# Patient Record
Sex: Female | Born: 1965 | Race: White | Hispanic: No | Marital: Married | Smoking: Never smoker
Health system: Southern US, Community
[De-identification: ages and names within clinical notes are randomized; demographics above are authoritative.]

## PROBLEM LIST (undated history)

## (undated) DIAGNOSIS — Z86010 Personal history of colonic polyps: Secondary | ICD-10-CM

## (undated) DIAGNOSIS — D509 Iron deficiency anemia, unspecified: Secondary | ICD-10-CM

## (undated) DIAGNOSIS — I471 Supraventricular tachycardia, unspecified: Secondary | ICD-10-CM

## (undated) DIAGNOSIS — F329 Major depressive disorder, single episode, unspecified: Secondary | ICD-10-CM

## (undated) DIAGNOSIS — M1990 Unspecified osteoarthritis, unspecified site: Secondary | ICD-10-CM

## (undated) DIAGNOSIS — R55 Syncope and collapse: Secondary | ICD-10-CM

## (undated) DIAGNOSIS — M5136 Other intervertebral disc degeneration, lumbar region: Secondary | ICD-10-CM

## (undated) DIAGNOSIS — F419 Anxiety disorder, unspecified: Secondary | ICD-10-CM

## (undated) DIAGNOSIS — I447 Left bundle-branch block, unspecified: Secondary | ICD-10-CM

## (undated) DIAGNOSIS — K5792 Diverticulitis of intestine, part unspecified, without perforation or abscess without bleeding: Secondary | ICD-10-CM

## (undated) DIAGNOSIS — K219 Gastro-esophageal reflux disease without esophagitis: Secondary | ICD-10-CM

## (undated) DIAGNOSIS — E119 Type 2 diabetes mellitus without complications: Secondary | ICD-10-CM

## (undated) DIAGNOSIS — N939 Abnormal uterine and vaginal bleeding, unspecified: Secondary | ICD-10-CM

## (undated) DIAGNOSIS — Z923 Personal history of irradiation: Secondary | ICD-10-CM

## (undated) DIAGNOSIS — I428 Other cardiomyopathies: Secondary | ICD-10-CM

## (undated) DIAGNOSIS — R0602 Shortness of breath: Secondary | ICD-10-CM

## (undated) DIAGNOSIS — I2510 Atherosclerotic heart disease of native coronary artery without angina pectoris: Secondary | ICD-10-CM

## (undated) DIAGNOSIS — T8859XA Other complications of anesthesia, initial encounter: Secondary | ICD-10-CM

## (undated) DIAGNOSIS — E7800 Pure hypercholesterolemia, unspecified: Secondary | ICD-10-CM

## (undated) DIAGNOSIS — I509 Heart failure, unspecified: Secondary | ICD-10-CM

## (undated) DIAGNOSIS — C50911 Malignant neoplasm of unspecified site of right female breast: Secondary | ICD-10-CM

## (undated) DIAGNOSIS — C50919 Malignant neoplasm of unspecified site of unspecified female breast: Secondary | ICD-10-CM

## (undated) DIAGNOSIS — I10 Essential (primary) hypertension: Secondary | ICD-10-CM

## (undated) DIAGNOSIS — A64 Unspecified sexually transmitted disease: Secondary | ICD-10-CM

## (undated) DIAGNOSIS — Z5189 Encounter for other specified aftercare: Secondary | ICD-10-CM

## (undated) DIAGNOSIS — Z9221 Personal history of antineoplastic chemotherapy: Secondary | ICD-10-CM

## (undated) DIAGNOSIS — A630 Anogenital (venereal) warts: Secondary | ICD-10-CM

## (undated) DIAGNOSIS — Z95810 Presence of automatic (implantable) cardiac defibrillator: Secondary | ICD-10-CM

## (undated) DIAGNOSIS — M51369 Other intervertebral disc degeneration, lumbar region without mention of lumbar back pain or lower extremity pain: Secondary | ICD-10-CM

## (undated) DIAGNOSIS — Z950 Presence of cardiac pacemaker: Secondary | ICD-10-CM

## (undated) DIAGNOSIS — G959 Disease of spinal cord, unspecified: Secondary | ICD-10-CM

## (undated) HISTORY — DX: Personal history of irradiation: Z92.3

## (undated) HISTORY — DX: Morbid (severe) obesity due to excess calories: E66.01

## (undated) HISTORY — DX: Abnormal uterine and vaginal bleeding, unspecified: N93.9

## (undated) HISTORY — DX: Unspecified sexually transmitted disease: A64

## (undated) HISTORY — DX: Supraventricular tachycardia, unspecified: I47.10

## (undated) HISTORY — DX: Major depressive disorder, single episode, unspecified: F32.9

## (undated) HISTORY — DX: Personal history of antineoplastic chemotherapy: Z92.21

## (undated) HISTORY — DX: Anxiety disorder, unspecified: F41.9

## (undated) HISTORY — DX: Anogenital (venereal) warts: A63.0

## (undated) HISTORY — DX: Unspecified osteoarthritis, unspecified site: M19.90

## (undated) HISTORY — DX: Encounter for other specified aftercare: Z51.89

## (undated) HISTORY — PX: MM BREAST STEREO BX*L*R/S: HXRAD495

## (undated) HISTORY — PX: PORTACATH PLACEMENT: SHX2246

## (undated) HISTORY — DX: Personal history of colonic polyps: Z86.010

## (undated) HISTORY — PX: CHOLECYSTECTOMY: SHX55

## (undated) HISTORY — DX: Malignant neoplasm of unspecified site of unspecified female breast: C50.919

## (undated) HISTORY — DX: Atherosclerotic heart disease of native coronary artery without angina pectoris: I25.10

## (undated) HISTORY — DX: Pure hypercholesterolemia, unspecified: E78.00

## (undated) HISTORY — DX: Iron deficiency anemia, unspecified: D50.9

## (undated) HISTORY — DX: Other cardiomyopathies: I42.8

## (undated) HISTORY — DX: Malignant neoplasm of unspecified site of right female breast: C50.911

## (undated) HISTORY — DX: Type 2 diabetes mellitus without complications: E11.9

## (undated) HISTORY — DX: Syncope and collapse: R55

## (undated) HISTORY — DX: Gastro-esophageal reflux disease without esophagitis: K21.9

## (undated) HISTORY — PX: OTHER SURGICAL HISTORY: SHX169

## (undated) HISTORY — DX: Left bundle-branch block, unspecified: I44.7

## (undated) HISTORY — DX: Disease of spinal cord, unspecified: G95.9

---

## 1997-11-19 DIAGNOSIS — Z5189 Encounter for other specified aftercare: Secondary | ICD-10-CM

## 1997-11-19 HISTORY — DX: Encounter for other specified aftercare: Z51.89

## 2001-03-23 ENCOUNTER — Emergency Department (HOSPITAL_COMMUNITY): Admission: EM | Admit: 2001-03-23 | Discharge: 2001-03-23 | Payer: Self-pay | Admitting: *Deleted

## 2001-03-30 ENCOUNTER — Encounter: Payer: Self-pay | Admitting: Family Medicine

## 2001-03-30 ENCOUNTER — Ambulatory Visit (HOSPITAL_COMMUNITY): Admission: RE | Admit: 2001-03-30 | Discharge: 2001-03-30 | Payer: Self-pay | Admitting: Family Medicine

## 2001-04-08 ENCOUNTER — Encounter: Payer: Self-pay | Admitting: Neurosurgery

## 2001-04-09 ENCOUNTER — Encounter: Payer: Self-pay | Admitting: Neurosurgery

## 2001-04-09 ENCOUNTER — Inpatient Hospital Stay (HOSPITAL_COMMUNITY): Admission: RE | Admit: 2001-04-09 | Discharge: 2001-04-10 | Payer: Self-pay | Admitting: Neurosurgery

## 2001-06-30 ENCOUNTER — Inpatient Hospital Stay (HOSPITAL_COMMUNITY): Admission: EM | Admit: 2001-06-30 | Discharge: 2001-07-08 | Payer: Self-pay | Admitting: Emergency Medicine

## 2001-06-30 ENCOUNTER — Encounter: Payer: Self-pay | Admitting: Neurosurgery

## 2001-07-04 ENCOUNTER — Encounter: Payer: Self-pay | Admitting: Neurosurgery

## 2001-07-05 ENCOUNTER — Encounter: Payer: Self-pay | Admitting: Neurosurgery

## 2001-07-06 ENCOUNTER — Encounter: Payer: Self-pay | Admitting: Pulmonary Disease

## 2001-07-07 ENCOUNTER — Encounter: Payer: Self-pay | Admitting: Pulmonary Disease

## 2001-07-25 ENCOUNTER — Emergency Department (HOSPITAL_COMMUNITY): Admission: EM | Admit: 2001-07-25 | Discharge: 2001-07-25 | Payer: Self-pay | Admitting: Family Medicine

## 2001-07-25 ENCOUNTER — Encounter: Payer: Self-pay | Admitting: *Deleted

## 2002-08-12 ENCOUNTER — Ambulatory Visit (HOSPITAL_COMMUNITY): Admission: RE | Admit: 2002-08-12 | Discharge: 2002-08-12 | Payer: Self-pay | Admitting: *Deleted

## 2002-08-12 ENCOUNTER — Encounter: Payer: Self-pay | Admitting: *Deleted

## 2003-02-24 ENCOUNTER — Ambulatory Visit (HOSPITAL_COMMUNITY): Admission: RE | Admit: 2003-02-24 | Discharge: 2003-02-24 | Payer: Self-pay | Admitting: *Deleted

## 2003-02-24 ENCOUNTER — Encounter: Payer: Self-pay | Admitting: *Deleted

## 2003-04-28 ENCOUNTER — Ambulatory Visit (HOSPITAL_COMMUNITY): Admission: AD | Admit: 2003-04-28 | Discharge: 2003-04-28 | Payer: Self-pay | Admitting: Obstetrics and Gynecology

## 2003-05-03 ENCOUNTER — Ambulatory Visit (HOSPITAL_COMMUNITY): Admission: RE | Admit: 2003-05-03 | Discharge: 2003-05-03 | Payer: Self-pay | Admitting: *Deleted

## 2003-05-03 ENCOUNTER — Encounter: Payer: Self-pay | Admitting: *Deleted

## 2003-05-26 ENCOUNTER — Ambulatory Visit (HOSPITAL_COMMUNITY): Admission: RE | Admit: 2003-05-26 | Discharge: 2003-05-26 | Payer: Self-pay | Admitting: *Deleted

## 2003-05-26 ENCOUNTER — Encounter: Payer: Self-pay | Admitting: *Deleted

## 2003-06-04 ENCOUNTER — Ambulatory Visit (HOSPITAL_COMMUNITY): Admission: AD | Admit: 2003-06-04 | Discharge: 2003-06-04 | Payer: Self-pay | Admitting: *Deleted

## 2003-06-08 ENCOUNTER — Ambulatory Visit (HOSPITAL_COMMUNITY): Admission: AD | Admit: 2003-06-08 | Discharge: 2003-06-08 | Payer: Self-pay | Admitting: *Deleted

## 2003-06-11 ENCOUNTER — Ambulatory Visit (HOSPITAL_COMMUNITY): Admission: AD | Admit: 2003-06-11 | Discharge: 2003-06-11 | Payer: Self-pay | Admitting: *Deleted

## 2003-06-15 ENCOUNTER — Ambulatory Visit (HOSPITAL_COMMUNITY): Admission: AD | Admit: 2003-06-15 | Discharge: 2003-06-15 | Payer: Self-pay | Admitting: *Deleted

## 2003-06-18 ENCOUNTER — Ambulatory Visit (HOSPITAL_COMMUNITY): Admission: AD | Admit: 2003-06-18 | Discharge: 2003-06-18 | Payer: Self-pay | Admitting: *Deleted

## 2003-06-22 ENCOUNTER — Ambulatory Visit (HOSPITAL_COMMUNITY): Admission: AD | Admit: 2003-06-22 | Discharge: 2003-06-22 | Payer: Self-pay | Admitting: *Deleted

## 2003-06-25 ENCOUNTER — Ambulatory Visit (HOSPITAL_COMMUNITY): Admission: AD | Admit: 2003-06-25 | Discharge: 2003-06-25 | Payer: Self-pay | Admitting: *Deleted

## 2003-06-26 ENCOUNTER — Ambulatory Visit (HOSPITAL_COMMUNITY): Admission: AD | Admit: 2003-06-26 | Discharge: 2003-06-26 | Payer: Self-pay | Admitting: *Deleted

## 2003-06-28 ENCOUNTER — Ambulatory Visit (HOSPITAL_COMMUNITY): Admission: AD | Admit: 2003-06-28 | Discharge: 2003-06-28 | Payer: Self-pay | Admitting: *Deleted

## 2003-07-01 ENCOUNTER — Ambulatory Visit (HOSPITAL_COMMUNITY): Admission: AD | Admit: 2003-07-01 | Discharge: 2003-07-01 | Payer: Self-pay | Admitting: *Deleted

## 2003-07-06 ENCOUNTER — Inpatient Hospital Stay (HOSPITAL_COMMUNITY): Admission: RE | Admit: 2003-07-06 | Discharge: 2003-07-09 | Payer: Self-pay | Admitting: *Deleted

## 2003-07-21 ENCOUNTER — Ambulatory Visit (HOSPITAL_COMMUNITY): Admission: RE | Admit: 2003-07-21 | Discharge: 2003-07-21 | Payer: Self-pay | Admitting: General Surgery

## 2003-07-21 ENCOUNTER — Encounter: Payer: Self-pay | Admitting: General Surgery

## 2004-06-20 ENCOUNTER — Other Ambulatory Visit: Admission: RE | Admit: 2004-06-20 | Discharge: 2004-06-20 | Payer: Self-pay

## 2005-07-06 ENCOUNTER — Ambulatory Visit: Payer: Self-pay | Admitting: Cardiology

## 2005-07-20 ENCOUNTER — Ambulatory Visit: Payer: Self-pay | Admitting: Cardiology

## 2005-07-27 ENCOUNTER — Ambulatory Visit: Payer: Self-pay | Admitting: *Deleted

## 2005-07-27 ENCOUNTER — Ambulatory Visit (HOSPITAL_COMMUNITY): Admission: RE | Admit: 2005-07-27 | Discharge: 2005-07-27 | Payer: Self-pay | Admitting: Cardiology

## 2005-08-20 ENCOUNTER — Ambulatory Visit: Payer: Self-pay | Admitting: Cardiology

## 2005-09-26 ENCOUNTER — Emergency Department (HOSPITAL_COMMUNITY): Admission: EM | Admit: 2005-09-26 | Discharge: 2005-09-27 | Payer: Self-pay | Admitting: Emergency Medicine

## 2005-10-16 ENCOUNTER — Ambulatory Visit: Payer: Self-pay | Admitting: Cardiology

## 2006-02-07 ENCOUNTER — Other Ambulatory Visit: Admission: RE | Admit: 2006-02-07 | Discharge: 2006-02-07 | Payer: Self-pay | Admitting: *Deleted

## 2006-02-07 ENCOUNTER — Encounter (INDEPENDENT_AMBULATORY_CARE_PROVIDER_SITE_OTHER): Payer: Self-pay | Admitting: *Deleted

## 2006-02-08 ENCOUNTER — Emergency Department (HOSPITAL_COMMUNITY): Admission: EM | Admit: 2006-02-08 | Discharge: 2006-02-08 | Payer: Self-pay | Admitting: Emergency Medicine

## 2006-06-19 ENCOUNTER — Emergency Department (HOSPITAL_COMMUNITY): Admission: EM | Admit: 2006-06-19 | Discharge: 2006-06-19 | Payer: Self-pay | Admitting: Emergency Medicine

## 2007-10-20 DIAGNOSIS — Z86010 Personal history of colonic polyps: Secondary | ICD-10-CM

## 2007-10-20 DIAGNOSIS — Z860101 Personal history of adenomatous and serrated colon polyps: Secondary | ICD-10-CM

## 2007-10-20 HISTORY — DX: Personal history of colonic polyps: Z86.010

## 2007-10-20 HISTORY — DX: Personal history of adenomatous and serrated colon polyps: Z86.0101

## 2007-10-31 ENCOUNTER — Ambulatory Visit: Payer: Self-pay | Admitting: Urgent Care

## 2007-11-03 ENCOUNTER — Ambulatory Visit: Payer: Self-pay | Admitting: Gastroenterology

## 2007-11-03 ENCOUNTER — Encounter: Payer: Self-pay | Admitting: Gastroenterology

## 2007-11-03 ENCOUNTER — Ambulatory Visit (HOSPITAL_COMMUNITY): Admission: RE | Admit: 2007-11-03 | Discharge: 2007-11-03 | Payer: Self-pay | Admitting: Gastroenterology

## 2007-11-03 HISTORY — PX: COLONOSCOPY: SHX174

## 2008-01-14 ENCOUNTER — Ambulatory Visit (HOSPITAL_COMMUNITY): Admission: RE | Admit: 2008-01-14 | Discharge: 2008-01-14 | Payer: Self-pay | Admitting: Internal Medicine

## 2008-01-25 ENCOUNTER — Emergency Department (HOSPITAL_COMMUNITY): Admission: EM | Admit: 2008-01-25 | Discharge: 2008-01-25 | Payer: Self-pay | Admitting: Emergency Medicine

## 2008-05-05 ENCOUNTER — Ambulatory Visit (HOSPITAL_COMMUNITY): Admission: RE | Admit: 2008-05-05 | Discharge: 2008-05-05 | Payer: Self-pay | Admitting: Internal Medicine

## 2009-01-24 ENCOUNTER — Ambulatory Visit (HOSPITAL_COMMUNITY): Admission: RE | Admit: 2009-01-24 | Discharge: 2009-01-24 | Payer: Self-pay | Admitting: Internal Medicine

## 2009-05-15 ENCOUNTER — Emergency Department (HOSPITAL_COMMUNITY): Admission: EM | Admit: 2009-05-15 | Discharge: 2009-05-16 | Payer: Self-pay | Admitting: Emergency Medicine

## 2009-06-07 ENCOUNTER — Other Ambulatory Visit: Admission: RE | Admit: 2009-06-07 | Discharge: 2009-06-07 | Payer: Self-pay | Admitting: Internal Medicine

## 2009-12-14 ENCOUNTER — Emergency Department (HOSPITAL_COMMUNITY): Admission: EM | Admit: 2009-12-14 | Discharge: 2009-12-14 | Payer: Self-pay | Admitting: Neurology

## 2010-01-07 ENCOUNTER — Emergency Department (HOSPITAL_COMMUNITY): Admission: EM | Admit: 2010-01-07 | Discharge: 2010-01-08 | Payer: Self-pay | Admitting: Emergency Medicine

## 2010-09-28 ENCOUNTER — Emergency Department (HOSPITAL_COMMUNITY)
Admission: EM | Admit: 2010-09-28 | Discharge: 2010-09-28 | Payer: Self-pay | Source: Home / Self Care | Admitting: Emergency Medicine

## 2010-11-19 HISTORY — PX: BREAST LUMPECTOMY: SHX2

## 2010-12-10 ENCOUNTER — Encounter: Payer: Self-pay | Admitting: Internal Medicine

## 2011-02-04 LAB — CBC
Hemoglobin: 12.8 g/dL (ref 12.0–15.0)
MCHC: 32.8 g/dL (ref 30.0–36.0)
MCV: 81.4 fL (ref 78.0–100.0)
Platelets: 236 10*3/uL (ref 150–400)
RDW: 17.5 % — ABNORMAL HIGH (ref 11.5–15.5)
WBC: 9.2 10*3/uL (ref 4.0–10.5)

## 2011-02-04 LAB — URINALYSIS, ROUTINE W REFLEX MICROSCOPIC
Bilirubin Urine: NEGATIVE
Glucose, UA: NEGATIVE mg/dL
Hgb urine dipstick: NEGATIVE
Ketones, ur: NEGATIVE mg/dL
Protein, ur: NEGATIVE mg/dL
Specific Gravity, Urine: 1.015 (ref 1.005–1.030)
Urobilinogen, UA: 0.2 mg/dL (ref 0.0–1.0)
pH: 6 (ref 5.0–8.0)

## 2011-02-04 LAB — COMPREHENSIVE METABOLIC PANEL
ALT: 33 U/L (ref 0–35)
AST: 38 U/L — ABNORMAL HIGH (ref 0–37)
Albumin: 3.8 g/dL (ref 3.5–5.2)
Alkaline Phosphatase: 42 U/L (ref 39–117)
BUN: 9 mg/dL (ref 6–23)
CO2: 28 mEq/L (ref 19–32)
Calcium: 9.1 mg/dL (ref 8.4–10.5)
Chloride: 102 mEq/L (ref 96–112)
GFR calc Af Amer: >60 mL/min (ref >60)
GFR calc non Af Amer: >60 mL/min (ref >60)
Glucose, Bld: 145 mg/dL — ABNORMAL HIGH (ref 70–99)
Sodium: 137 mEq/L (ref 135–145)
Total Bilirubin: 0.3 mg/dL (ref 0.3–1.2)
Total Protein: 7.5 g/dL (ref 6.0–8.3)

## 2011-02-04 LAB — PREGNANCY, URINE: Preg Test, Ur: NEGATIVE

## 2011-02-04 LAB — DIFFERENTIAL
Basophils Absolute: 0 10*3/uL (ref 0.0–0.1)
Basophils Relative: 0 % (ref 0–1)
Eosinophils Absolute: 0 10*3/uL (ref 0.0–0.7)
Eosinophils Relative: 0 % (ref 0–5)
Lymphs Abs: 1.4 10*3/uL (ref 0.7–4.0)
Monocytes Absolute: 0.4 10*3/uL (ref 0.1–1.0)
Monocytes Relative: 4 % (ref 3–12)
Neutro Abs: 7.3 10*3/uL (ref 1.7–7.7)
Neutrophils Relative %: 80 % — ABNORMAL HIGH (ref 43–77)

## 2011-02-04 LAB — LIPASE, BLOOD: Lipase: 32 U/L (ref 11–59)

## 2011-02-19 ENCOUNTER — Other Ambulatory Visit (HOSPITAL_COMMUNITY): Payer: Self-pay | Admitting: *Deleted

## 2011-02-19 ENCOUNTER — Other Ambulatory Visit: Payer: Self-pay | Admitting: Gastroenterology

## 2011-02-19 DIAGNOSIS — N6310 Unspecified lump in the right breast, unspecified quadrant: Secondary | ICD-10-CM

## 2011-02-26 LAB — URINALYSIS, ROUTINE W REFLEX MICROSCOPIC
Bilirubin Urine: NEGATIVE
Glucose, UA: NEGATIVE mg/dL
Hgb urine dipstick: NEGATIVE
Nitrite: NEGATIVE
Protein, ur: NEGATIVE mg/dL
Urobilinogen, UA: 0.2 mg/dL (ref 0.0–1.0)

## 2011-02-26 LAB — CBC
Hemoglobin: 13.3 g/dL (ref 12.0–15.0)
MCHC: 33.7 g/dL (ref 30.0–36.0)
Platelets: 312 10*3/uL (ref 150–400)
RBC: 4.81 MIL/uL (ref 3.87–5.11)
RDW: 15.6 % — ABNORMAL HIGH (ref 11.5–15.5)
WBC: 15.2 10*3/uL — ABNORMAL HIGH (ref 4.0–10.5)

## 2011-02-26 LAB — BASIC METABOLIC PANEL
CO2: 27 mEq/L (ref 19–32)
Chloride: 101 mEq/L (ref 96–112)
Creatinine, Ser: 1 mg/dL (ref 0.4–1.2)
GFR calc Af Amer: >60 mL/min (ref >60)
GFR calc non Af Amer: >60 mL/min (ref >60)
Glucose, Bld: 164 mg/dL — ABNORMAL HIGH (ref 70–99)
Sodium: 138 mEq/L (ref 135–145)

## 2011-02-26 LAB — DIFFERENTIAL
Basophils Absolute: 0.1 10*3/uL (ref 0.0–0.1)
Basophils Relative: 0 % (ref 0–1)
Eosinophils Absolute: 0.2 10*3/uL (ref 0.0–0.7)
Eosinophils Relative: 1 % (ref 0–5)
Lymphocytes Relative: 14 % (ref 12–46)
Lymphs Abs: 2.2 10*3/uL (ref 0.7–4.0)
Monocytes Absolute: 0.6 10*3/uL (ref 0.1–1.0)
Monocytes Relative: 4 % (ref 3–12)
Neutro Abs: 12.2 10*3/uL — ABNORMAL HIGH (ref 1.7–7.7)
Neutrophils Relative %: 80 % — ABNORMAL HIGH (ref 43–77)

## 2011-02-26 LAB — GLUCOSE, CAPILLARY: Glucose-Capillary: 193 mg/dL — ABNORMAL HIGH (ref 70–99)

## 2011-02-26 LAB — PREGNANCY, URINE: Preg Test, Ur: NEGATIVE

## 2011-02-28 ENCOUNTER — Other Ambulatory Visit (HOSPITAL_COMMUNITY): Payer: Self-pay | Admitting: *Deleted

## 2011-02-28 ENCOUNTER — Ambulatory Visit (HOSPITAL_COMMUNITY)
Admission: RE | Admit: 2011-02-28 | Discharge: 2011-02-28 | Disposition: A | Discharge status: Home / Self Care | Payer: Medicaid Other | Source: Ambulatory Visit | Attending: *Deleted | Admitting: *Deleted

## 2011-02-28 ENCOUNTER — Other Ambulatory Visit: Payer: Self-pay | Admitting: Gastroenterology

## 2011-02-28 ENCOUNTER — Ambulatory Visit (HOSPITAL_COMMUNITY)
Admission: RE | Admit: 2011-02-28 | Discharge: 2011-02-28 | Disposition: A | Discharge status: Home / Self Care | Payer: Medicaid Other | Source: Ambulatory Visit | Attending: Gastroenterology | Admitting: Gastroenterology

## 2011-02-28 ENCOUNTER — Other Ambulatory Visit: Payer: Self-pay | Admitting: Diagnostic Radiology

## 2011-02-28 DIAGNOSIS — C50919 Malignant neoplasm of unspecified site of unspecified female breast: Secondary | ICD-10-CM | POA: Insufficient documentation

## 2011-02-28 DIAGNOSIS — N6310 Unspecified lump in the right breast, unspecified quadrant: Secondary | ICD-10-CM

## 2011-02-28 DIAGNOSIS — IMO0002 Reserved for concepts with insufficient information to code with codable children: Secondary | ICD-10-CM

## 2011-03-05 ENCOUNTER — Other Ambulatory Visit: Payer: Self-pay | Admitting: Gastroenterology

## 2011-03-05 DIAGNOSIS — C50911 Malignant neoplasm of unspecified site of right female breast: Secondary | ICD-10-CM

## 2011-03-06 ENCOUNTER — Ambulatory Visit
Admission: RE | Admit: 2011-03-06 | Discharge: 2011-03-06 | Disposition: A | Discharge status: Home / Self Care | Payer: Self-pay | Source: Ambulatory Visit | Attending: Gastroenterology | Admitting: Gastroenterology

## 2011-03-06 DIAGNOSIS — C50911 Malignant neoplasm of unspecified site of right female breast: Secondary | ICD-10-CM

## 2011-03-06 MED ORDER — GADOBENATE DIMEGLUMINE 529 MG/ML IV SOLN
20.0000 mL | Freq: Once | INTRAVENOUS | Status: AC | PRN
  Administered 2011-03-06: 20 mL via INTRAVENOUS

## 2011-03-13 ENCOUNTER — Other Ambulatory Visit (HOSPITAL_COMMUNITY): Payer: Self-pay | Admitting: General Surgery

## 2011-03-13 DIAGNOSIS — C50919 Malignant neoplasm of unspecified site of unspecified female breast: Secondary | ICD-10-CM

## 2011-03-19 ENCOUNTER — Other Ambulatory Visit: Payer: Self-pay | Admitting: General Surgery

## 2011-03-19 ENCOUNTER — Encounter (HOSPITAL_COMMUNITY): Payer: Medicaid Other

## 2011-03-19 LAB — COMPREHENSIVE METABOLIC PANEL
Albumin: 3.8 g/dL (ref 3.5–5.2)
Alkaline Phosphatase: 50 U/L (ref 39–117)
BUN: 9 mg/dL (ref 6–23)
Calcium: 9.1 mg/dL (ref 8.4–10.5)
Creatinine, Ser: 0.77 mg/dL (ref 0.4–1.2)
GFR calc non Af Amer: >60 mL/min (ref >60)
Glucose, Bld: 78 mg/dL (ref 70–99)
Potassium: 3.6 mEq/L (ref 3.5–5.1)
Sodium: 137 mEq/L (ref 135–145)
Total Bilirubin: 0.4 mg/dL (ref 0.3–1.2)
Total Protein: 7.9 g/dL (ref 6.0–8.3)

## 2011-03-19 LAB — CBC
HCT: 39.4 % (ref 36.0–46.0)
Hemoglobin: 12.3 g/dL (ref 12.0–15.0)
MCH: 26.3 pg (ref 26.0–34.0)
MCV: 84.2 fL (ref 78.0–100.0)
RBC: 4.68 MIL/uL (ref 3.87–5.11)
RDW: 15.3 % (ref 11.5–15.5)

## 2011-03-19 LAB — SURGICAL PCR SCREEN
MRSA, PCR: NEGATIVE
Staphylococcus aureus: NEGATIVE

## 2011-03-21 ENCOUNTER — Ambulatory Visit (HOSPITAL_COMMUNITY)
Admission: RE | Admit: 2011-03-21 | Discharge: 2011-03-21 | Disposition: A | Discharge status: Home / Self Care | Payer: Medicaid Other | Source: Ambulatory Visit | Attending: General Surgery | Admitting: General Surgery

## 2011-03-21 ENCOUNTER — Other Ambulatory Visit: Payer: Self-pay | Admitting: General Surgery

## 2011-03-21 ENCOUNTER — Encounter (HOSPITAL_COMMUNITY): Payer: Self-pay

## 2011-03-21 DIAGNOSIS — Z01812 Encounter for preprocedural laboratory examination: Secondary | ICD-10-CM | POA: Insufficient documentation

## 2011-03-21 DIAGNOSIS — I10 Essential (primary) hypertension: Secondary | ICD-10-CM | POA: Insufficient documentation

## 2011-03-21 DIAGNOSIS — E119 Type 2 diabetes mellitus without complications: Secondary | ICD-10-CM | POA: Insufficient documentation

## 2011-03-21 DIAGNOSIS — C50919 Malignant neoplasm of unspecified site of unspecified female breast: Secondary | ICD-10-CM

## 2011-03-21 DIAGNOSIS — Z01818 Encounter for other preprocedural examination: Secondary | ICD-10-CM | POA: Insufficient documentation

## 2011-03-21 DIAGNOSIS — Z79899 Other long term (current) drug therapy: Secondary | ICD-10-CM | POA: Insufficient documentation

## 2011-03-21 LAB — GLUCOSE, CAPILLARY: Glucose-Capillary: 122 mg/dL — ABNORMAL HIGH (ref 70–99)

## 2011-03-21 MED ORDER — TECHNETIUM TC 99M SULFUR COLLOID FILTERED
1.0000 | Freq: Once | INTRAVENOUS | Status: AC | PRN
  Administered 2011-03-21: 1 via INTRADERMAL

## 2011-03-21 NOTE — H&P (Addendum)
  [name], [name]         ACCOUNT NO.:  [id number]  MEDICAL RECORD NO.:  [id number]          PATIENT TYPE:  OPT  LOCATION:  DAY                           FACILITY:  [facility]  PHYSICIAN:  [name], M.D.  DATE OF BIRTH:  03-May-1966  DATE OF ADMISSION: DATE OF DISCHARGE:  LH                             HISTORY & PHYSICAL   CHIEF COMPLAINT:  Right breast carcinoma.  HISTORY OF PRESENT ILLNESS:  The patient is a 45 year old white female who is referred for evaluation and treatment of a right breast cancer. This was found on routine examination.  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  The patient denies any nipple discharge. There is no immediate family history of breast cancer.  PAST MEDICAL HISTORY:  Unremarkable.  PAST SURGICAL HISTORY:  Unremarkable.  CURRENT MEDICATIONS:  None.  ALLERGIES:  No known drug allergies.  REVIEW OF SYSTEMS:  The patient denies drinking or smoking.  She denies any other cardiopulmonary difficulties or bleeding disorders.  PAST FAMILY MEDICAL HISTORY:  There is no family history of breast cancer.  PHYSICAL EXAMINATION:  GENERAL:  The patient is a obese white female, in no acute distress. HEENT:  Unremarkable. NECK:  Supple without lymphadenopathy. LUNGS:  Clear to auscultation with equal breath sounds bilaterally. HEART:  Regular rate and rhythm without S3, S4 or murmurs. ABDOMEN:  Unremarkable. BREASTS:  Right breast examination reveals a 3-5 cm dominant mass noted centrally behind the right nipple.  No nipple discharge or dimpling is noted.  No axillary lymphadenopathy.  Left breast examination reveals no dominant mass, nipple discharge, or dimpling.  The axilla is negative for palpable nodes.  Mammogram and MRI results were reviewed.  IMPRESSION:  Right breast carcinoma.  PLAN:  The patient is scheduled to undergo a right partial mastectomy with sentinel lymph node biopsy and possible axillary dissection  on Mar 21, 2011.  Risks and benefits of the procedure including bleeding, infection, recurrence and the possibility of needing a completion mastectomy due to incomplete margins were fully explained to the patient, gave informed consent.     [name], M.D.     MAJ/MEDQ  D:  03/13/2011  T:  03/14/2011  Job:  [id number]  cc:   [facility] Department  Electronically Signed by [name] M.D. on 04/16/2011 08:37:43 PM

## 2011-03-24 ENCOUNTER — Emergency Department (HOSPITAL_COMMUNITY)
Admission: EM | Admit: 2011-03-24 | Discharge: 2011-03-24 | Disposition: A | Discharge status: Home / Self Care | Payer: Medicaid Other | Attending: Emergency Medicine | Admitting: Emergency Medicine

## 2011-03-24 DIAGNOSIS — IMO0002 Reserved for concepts with insufficient information to code with codable children: Secondary | ICD-10-CM | POA: Insufficient documentation

## 2011-03-24 DIAGNOSIS — Z853 Personal history of malignant neoplasm of breast: Secondary | ICD-10-CM | POA: Insufficient documentation

## 2011-03-24 DIAGNOSIS — E7800 Pure hypercholesterolemia, unspecified: Secondary | ICD-10-CM | POA: Insufficient documentation

## 2011-03-24 DIAGNOSIS — Z79899 Other long term (current) drug therapy: Secondary | ICD-10-CM | POA: Insufficient documentation

## 2011-03-24 DIAGNOSIS — E119 Type 2 diabetes mellitus without complications: Secondary | ICD-10-CM | POA: Insufficient documentation

## 2011-03-24 DIAGNOSIS — F329 Major depressive disorder, single episode, unspecified: Secondary | ICD-10-CM | POA: Insufficient documentation

## 2011-03-24 DIAGNOSIS — Y836 Removal of other organ (partial) (total) as the cause of abnormal reaction of the patient, or of later complication, without mention of misadventure at the time of the procedure: Secondary | ICD-10-CM | POA: Insufficient documentation

## 2011-03-24 DIAGNOSIS — I10 Essential (primary) hypertension: Secondary | ICD-10-CM | POA: Insufficient documentation

## 2011-03-24 DIAGNOSIS — F3289 Other specified depressive episodes: Secondary | ICD-10-CM | POA: Insufficient documentation

## 2011-03-24 LAB — GLUCOSE, CAPILLARY: Glucose-Capillary: 112 mg/dL — ABNORMAL HIGH (ref 70–99)

## 2011-03-24 NOTE — Op Note (Signed)
[name], [name]         ACCOUNT NO.:  [id number]  MEDICAL RECORD NO.:  [id number]           PATIENT TYPE:  O  LOCATION:  DAYP                          FACILITY:  [facility]  PHYSICIAN:  [name], M.D.  DATE OF BIRTH:  16-Aug-1966  DATE OF PROCEDURE:  03/21/2011 DATE OF DISCHARGE:                              OPERATIVE REPORT   PREOPERATIVE DIAGNOSIS:  Right breast carcinoma, stage II clinically.  POSTOPERATIVE DIAGNOSIS:  Right breast carcinoma, stage II clinically.  PROCEDURE:  Right partial mastectomy, sentinel lymph node biopsy.  SURGEON:  [name], MD  ANESTHESIA:  General endotracheal.  INDICATIONS:  The patient is a 45 year old white female who presents with biopsy-proven right breast carcinoma.  The patient now comes to the operating room for right partial mastectomy with sentinel lymph node biopsy.  The risks and benefits of the procedure including bleeding, infection, and the possibility of unclear margins were fully explained to the patient, gave informed consent.  PROCEDURE NOTE:  The patient was placed in the supine position.  After induction of general endotracheal anesthesia, the area of the tumorous lesion in the right breast was injected with methylene blue.  This was massaged into the breast tissue for 5 minutes.  The right breast and axilla were then prepped and draped using the usual sterile technique with ChloraPrep.  Surgical site confirmation was performed.  Using the navigator, an incision was made into the right axilla.  At the skin level, the count was 300.  The dissection was taken down to the first sentinel lymph node.  The in vivo count was 3000, ex vivo 4000, the node was noted to be blue.  It was sent to pathology for further examination.  The basin count was less than 100.  The frozen section of the sentinel lymph node was negative for malignancy.  The wound was irrigated with normal saline.  Any bleeding was controlled using  clips and Bovie electrocautery.  The skin was closed using a 4-0 Vicryl subcuticular suture.  Sensorcaine 0.5% was instilled into the surrounding wound.  Dermabond was then applied.  Next, a curvilinear incision was made over the areola.  This dissection was taken down to the primary mass which was very closely adherent to the chest wall.  The mass was removed along with some fascia from the chest wall.  A short suture was placed superiorly and long suture placed laterally for orientation purposes.  It was sent to Pathology for further examination.  Any bleeding was controlled using Bovie electrocautery.  The subcutaneous layer was reapproximated using 2-0 Vicryl interrupted sutures.  The skin was closed using a 4-0 Vicryl subcuticular suture.  Sensorcaine 0.5% was instilled into the surrounding wound.  Dermabond was then applied.  All tape and needle counts were correct at the end of the procedure. The patient was extubated in the operating room and went back to recovery room, awake in stable condition.  COMPLICATIONS:  None.  SPECIMEN: 1. Sentinel lymph node, right axilla. 2. Right breast tissue.  BLOOD LOSS:  50 mL.     [name], M.D.     MAJ/MEDQ  D:  03/21/2011  T:  03/22/2011  Job:  [id number]  cc:   [facility] Department.  Electronically Signed by [name] M.D. on 03/24/2011 08:07:11 PM

## 2011-04-03 NOTE — Op Note (Signed)
NAME:  [name], [name]         ACCOUNT NO.:  [id number]   MEDICAL RECORD NO.:  [id number]          PATIENT TYPE:  AMB   LOCATION:  DAY                           FACILITY:  [facility]   PHYSICIAN:  Kassie Mends, M.D.      DATE OF BIRTH:  July 27, 1966   DATE OF PROCEDURE:  11/03/2007  DATE OF DISCHARGE:                               OPERATIVE REPORT   REFERRING :  [facility] Department   PROCEDURE:  Colonoscopy with cold forceps polypectomy and cold forceps  biopsy.   INDICATION FOR EXAM:  [name] is a 45 year old female who recently  developed diarrhea over the last year.  She has also had some occasional  abdominal cramping.  The abdominal cramping can sometimes be relieved  with bowel movements.  She saw rectal bleeding once.  She takes Prevacid  for her gastroesophageal reflux disease.  She was changed to Nexium as  an outpatient on October 31, 2007.  She had a cholecystectomy years  ago.   FINDINGS:  1. A 4-mm sessile sigmoid colon polyp removed via cold forceps.      Otherwise no masses, inflammatory changes, diverticula or AVMs      seen.  Random colon biopsies obtained to evaluate for microscopic      colitis.  2. Small internal hemorrhoids, otherwise normal retroflexed view of      the rectum.   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  Biopsies obtained to evaluate for microscopic colitis.  The differential  diagnosis also includes bile salt-induced diarrhea.   RECOMMENDATIONS:  1. Return visit with Lorenza Burton, N.P., in 6 weeks to reassess her      diarrhea.  2. Will call [name] with the results of her biopsies.  If her      polyp is adenomatous, then she should have a screening colonoscopy      in 5 years and her first-degree relatives should have a screening      colonoscopy at age 40 and then every 5 years.  3. Continue daily fiber supplementation.  4. Add Bentyl 10 mg twice daily.  5. No aspirin, NSAIDs  or anticoagulation for 7 days.  6. She should follow a high-fiber diet.  She was given a handout on      high-fiber diet, polyps and hemorrhoids.   PROCEDURE TECHNIQUE:  Physical exam was performed and informed consent  was obtained from the patient after explaining the benefits, risks and  alternatives to procedure.  The patient was connected to the monitor and  placed in the left lateral position.  Continuous oxygen was provided by  nasal cannula and IV medicine administered through an indwelling  cannula.  After administration of sedation and rectal exam, the  patient's rectum was intubated and the scope was advanced under direct  visualization to the cecum.  The scope was removed slowly by carefully  examining the color, texture, anatomy and integrity of the mucosa on the  way out.  The patient was recovered in endoscopy and discharged to the  floor in satisfactory condition.   ADDENDUM:  Biopsies negative for microscopic  colitis. Diarhhea likely  secodnary to IBS or bile-salt induced diarrhea.      Kassie Mends, M.D.  Electronically Signed     SM/MEDQ  D:  11/03/2007  T:  11/04/2007  Job:  [id number]

## 2011-04-03 NOTE — Consult Note (Signed)
NAME:  [name], [name]         ACCOUNT NO.:  [id number]   MEDICAL RECORD NO.:  [id number]          PATIENT TYPE:  AMB   LOCATION:  DAY                           FACILITY:  [facility]   PHYSICIAN:  Kassie Mends, M.D.      DATE OF BIRTH:  08-31-1966   DATE OF CONSULTATION:  10/31/2007  DATE OF DISCHARGE:                                 CONSULTATION   REQUESTING PHYSICIAN:  [facility].   CHIEF COMPLAINT:  Intermittent chronic diarrhea with intermittent  hematochezia/family history of colon cancer.   HISTORY OF PRESENT ILLNESS:  [name] is a  is a 45 year old  female.  She has had intermittent chronic diarrhea for quite some time.  She will have three to four loose bowel movements a day for several days  of the week.  She has also noticed intermittent small to moderate volume  bright red blood with wiping post defecation.  She denies any mucus in  her stools.  She does have some transient nausea.  Denies any vomiting.  She rarely has constipation.  She has taken Imodium for the diarrhea  which did seem to help some.  She has history of chronic GERD.  She is  on Prevacid 30 mg daily now.  She previously was on Nexium 40 mg daily  which seemed to help more.  She occasionally is having breakthrough  symptoms 3-4 days a week since she made the change.  The reason she  changed was due to the fact that she was unable to obtain further  samples and could not afford the medication herself.  She has noticed a  mid abdominal pain just above the umbilicus which is usually resolved  post defecation.  She denies any significant dysphagia or odynophagia.  She has had some globus sensation in the back of her throat.   PAST MEDICAL HISTORY:  1. Heart murmur.  2. Hypertension.  3. Anxiety.  4. Depression.  5. GERD.  6. Two back surgeries.  7. D&C.   CURRENT MEDICATIONS:  1. Labetalol 200 mg b.i.d.  2. Paxil 37.5 mg daily.  3. Cardizem unknown dose daily.  4. Prevacid 30 mg daily.   ALLERGIES:  No known drug allergies.   FAMILY HISTORY:  Positive for mother diagnosed with colon cancer in her  mid 62s.  She died from metastatic disease.  Father deceased at age 33  with coronary disease, hypertension, diabetes mellitus.  One sister has  bipolar disorder.  One brother with hypertension.   SOCIAL HISTORY:  [name] is married.  She has three healthy  children.  She is unemployed.  She denies any tobacco, alcohol or drug  use.   REVIEW OF SYSTEMS:  See HPI, otherwise negative.   PHYSICAL EXAMINATION:  VITAL SIGNS:  Weight is 266.5 pounds, height 64  inches, temperature 98.6, blood pressure 122/92 and pulse 88.  GENERAL:  [name] is an obese, Caucasian female who is alert,  oriented and cooperative in no acute distress.  HEENT:  Sclerae clear, nonicteric.  Conjunctivae pink.  Oropharynx pink  and moist without lesions.  NECK:  Supple with thyromegaly.  CHEST:  Heart regular rate and rhythm with a 1/6 murmur noted.  LUNGS:  Clear to auscultation bilaterally.  ABDOMEN:  Protuberant with positive bowel sounds x4.  No bruits  auscultated.  Soft, nontender, nondistended without palpable mass or  hepatosplenomegaly.  No rebound tenderness or guarding.  EXTREMITIES:  Without clubbing or edema bilaterally.  RECTAL:  Deferred.   IMPRESSION:  [name] is a 45 year old female with a history of  chronic intermittent diarrhea occurring 3-4 days per week along with  intermittent hematochezia.  I suspect she may have IBS with benign  anorectal bleeding from either hemorrhoids or fissure.  However,  she  does have a family history which is significant for colon cancer and is  going to require further evaluation to rule out colorectal neoplasia as  well as inflammatory bowel disease.   She has GERD which was well-controlled on Nexium.  Due to the cost of  the medication, she has been switched to Prevacid and is having some  breakthrough symptoms.    RECOMMENDATIONS:  1. Will request recent labs from the free clinic.  2. Will resume Nexium 40 mg daily and discontinue Prevacid.  Will give      her 2 weeks' worth of samples and information to obtain patient      assistance through [facility].  3. Benefiber samples or daily fiber supplement of choice.  4. Colonoscopy with Dr. Cira Servant in the near future.  Discussed the      procedure including risks and benefits to include, but not limited      to bleeding, infection, perforation, drug reaction.  She agrees      with plan and consent will be obtained.   We would like to thank the free clinic for allowing [place] to participate in  the care of [name].      Lorenza Burton, N.P.      Kassie Mends, M.D.  Electronically Signed    KJ/MEDQ  D:  10/31/2007  T:  10/31/2007  Job:  [id number]

## 2011-04-06 NOTE — Discharge Summary (Signed)
NAME:  [name], [name]                       ACCOUNT NO.:  [id number]   MEDICAL RECORD NO.:  [id number]                   PATIENT TYPE:  INP   LOCATION:  A417                                 FACILITY:  [facility]   PHYSICIAN:  Langley Gauss, M.D.                DATE OF BIRTH:  Mar 13, 1966   DATE OF ADMISSION:  07/06/2003  DATE OF DISCHARGE:  07/09/2003                                 DISCHARGE SUMMARY   PROCEDURES:  1. July 06, 2003 - placement of a Foley bulb.  2. July 06, 2003 - placement of epidural.  3. July 06, 2003 - vaginal delivery.   DISCHARGE MEDICATIONS:  1. Labetalol 200 mg p.o. b.i.d.  2. Vicoprofen one p.o. q.6h. p.r.n. postpartum pain.   DISPOSITION:  Patient is to followup in the office in two weeks time; at  which time, we can assess her blood pressure, at that time, and make any  changes in the medication if clinically indicated.   PERTINENT LABORATORY STUDIES:  Include admission hemoglobin and hematocrit  11.5/34.3 with a white count of 8.5.  Postpartum day #1, hemoglobin 10.7,  hematocrit 31.5 with a white count of 8.4.  O positive blood type.   HOSPITAL COURSE:  See previous dictations.  The patient admitted on July 06, 2003 for induction of labor secondary to findings of chronic  hypertension worsening requiring increased dosage of Labetalol.  In  addition, the patient during the previous week was noted to have 1+  proteinuria however, this cleared with bed rest.  Thus patient's diagnosis  and indication for delivery is chronic hypertension.   Due to the unfavorable nature of the cervix, a Foley bulb was placed  intracervically on July 06, 2003.  This actually fell out during the early  morning hours, after which time it was possible to perform an amniotomy with  findings of clear amniotic fluid.  Fetal scalp electrode was placed.  This  documented a reassuring fetal heart rate.  With the onset of active labor,  the patient requested epidural  analgesic despite it's technical difficulty  due to morbid obesity.  I was able to place the epidural on the first  attempt without difficulty.  the epidural functioned very well throughout  the course of labor.  She, thereafter, progressed rapidly along the labor  curve to complete dilatation, delivered in a well controlled atraumatic  manner.   Postpartum the patient did well.  She was continued on Labetalol 200 mg p.o.  b.i.d.  She did have a significant decrease in her dependent edema which she  had developed during the pregnancy.  Vital signs remained stable.  She had  no excessive postpartum bleeding.  Thus, mother and infant were both  discharged to home on postpartum day #2 after an extended hospitalization at  which time we were able to assess and observe her blood pressure status.  Langley Gauss, M.D.    DC/MEDQ  D:  07/13/2003  T:  07/13/2003  Job:  [id number]

## 2011-04-06 NOTE — H&P (Signed)
[name], [name]                         ACCOUNT NO.:  [id number]   MEDICAL RECORD NO.:  [id number]                   PATIENT TYPE:   LOCATION:                                       FACILITY:   PHYSICIAN:  [name], M.D.               DATE OF BIRTH:  [birth year]   DATE OF ADMISSION:  DATE OF DISCHARGE:                                HISTORY & PHYSICAL   CHIEF COMPLAINT:  Biliary colic, cholelithiasis.   HISTORY OF PRESENT ILLNESS:  The patient is a 45 year old white female who  is one week postpartum who suffers from biliary colic secondary to  cholelithiasis.  Her cholelithiasis was diagnosed during her pregnancy.  Her  symptoms have continued since her delivery last week.  She is not  breastfeeding currently.  No fever or chills have been noted.   PAST MEDICAL HISTORY:  Hypertension and depression.   PAST SURGICAL HISTORY:  Back surgery x2.   CURRENT MEDICATIONS:  1. Paxil.  2. Prevacid.  3. Labetalol 100 mg p.o. t.i.d.   ALLERGIES:  No known drug allergies.   REVIEW OF SYSTEMS:  The patient denies drinking or smoking.  She has had  some reflux disease.  She denies any bleeding disorders.   PHYSICAL EXAMINATION:  GENERAL:  The patient is a well-developed, mildly-  obese white female in no acute distress.  VITAL SIGNS:  She is afebrile and vital signs are stable.  HEENT:  Reveals no scleral icterus.  LUNGS:  Clear to auscultation with equal breath sounds bilaterally.  HEART:  Reveals a regular rate and rhythm without S3, S4, or murmurs.  ABDOMEN:  Soft with tenderness noted in the right upper quadrant to deep  palpation.  No hepatosplenomegaly, masses, or hernias are identified.   LABORATORY DATA:  Ultrasound of the gallbladder done in the past revealed  cholelithiasis with a slightly dilated common bile duct to 8 mm.  No  choledocholithiasis has been seen.   IMPRESSION:  Biliary colic, cholelithiasis.    PLAN:  The patient is scheduled for  laparoscopic cholecystectomy with  cholangiograms on July 21, 2003.  The risks and benefits of the  procedure including bleeding, infection, hepatobiliary injury, the  possibility of an open procedure were fully explained to the patient, who  gave informed consent.                                               [name], M.D.    MAJ/MEDQ  D:  07/15/2003  T:  07/15/2003  Job:  [id number]   cc:   Langley Gauss, M.D.  [address], [address]  [place]  [place] [address]  Fax: [phone]   Scott A. Gerda Diss, M.D.  [address], [address]  [place]  [place] [address]  Fax:  [phone]  

## 2011-04-06 NOTE — Op Note (Signed)
[place]  Patient:    [name], [name]                    MRN: [id number] Proc. Date: 04/09/01 Adm. Date:  [id number] Attending:  Barton Fanny                           Operative Report  PREOPERATIVE DIAGNOSIS:  Left L5-S1 lumbar disk herniation.  POSTOPERATIVE DIAGNOSIS:  Left L5-S1 lumbar disk herniation.  OPERATION PERFORMED:  Left L5-S1 lumbar laminotomy, microdiskectomy.  SURGEON:  Hewitt Shorts, M.D.  ASSISTANT:  Danae Orleans. Venetia Maxon, M.D.  ANESTHESIA:  General endotracheal.  INDICATIONS FOR PROCEDURE:  The patient is a 45 year old woman who presented with a left lumbar radiculopathy with significant weakness of her left dorsiflexors, plantar flexion, extensor hallucis longus.  The decision was made to proceed with elective lumbar laminotomy and microdiskectomy.  DESCRIPTION OF PROCEDURE:  The patient was brought to the operating room and placed under general endotracheal anesthesia.  The patient was turned to a prone position and the lumbar region was prepped with DuraPrep and draped in a sterile fashion.  The midline was infiltrated with local anesthetic with epinephrine, a localizing x-ray was taken and an incision made over the L5-S1 level and carried down to subcutaneous tissues.  Bipolar cautery and electrocautery were used to maintain hemostasis.  Dissection was carried down to the lumbar fascia which was incised on the left side of the midline and the paraspinous muscles were dissected from the spinous process of the lamina in subperiosteal fashion.  The self-retaining retractor was placed and then another x-ray was taken to confirm our localization at the L5-S1 interlaminar space.  Laminotomy was performed using the [facility] Max drill and Kerrison punches.  The ligamentum flavum was removed and then the microscope was draped and brought into the field to provide additional magnification, illumination and  visualization.  The remainder of the procedure was performed using microdissection and microsurgical technique. The thecal sac and left S1 nerve root were gently retracted medially exposing a large free fragment of disk herniation.  This was removed.  We then incised the remaining annular fibers and proceeded with a thorough diskectomy removing a large amount of degenerated disk material.  There was significant spondylitic overgrowth which was removed using an osteophyte removal tool and central spondylitic disk protrusion was removed as far medially as feasible.  In the end, good decompression of the thecal sac and nerve root was achieved with removal of all loose fragments of disk material from both the disk space and the epidural space.  Once the diskectomy was completed and hemostasis established, we instilled 2 cc of fentanyl and 80 mg of Depo-Medrol into the epidural space, a small piece of subcutaneous fat was placed in the laminotomy defect and then the wound was closed in multiple layers.  The deep fascia was closed with interrupted 0 undyed Vicryl sutures and the subcutaneous and subcuticular layer were closed with interrupted inverted 2-0 undyed Vicryl sutures and the skin edges were approximated with Dermabond.  The patient tolerated the procedure well.  Estimated blood loss was less than 50 cc.  Sponge, needle and instrument counts were correct.  Following surgery the patient was to be turned back to supine position, reversed from the anesthetic and transferred to the recovery room for further care. DD:  04/09/01 TD:  04/09/01 Job: [id number]

## 2011-04-06 NOTE — Op Note (Signed)
NAME:  [name], [name]                       ACCOUNT NO.:  [id number]   MEDICAL RECORD NO.:  [id number]                   PATIENT TYPE:  INP   LOCATION:  A417                                 FACILITY:  [facility]   PHYSICIAN:  Langley Gauss, M.D.                DATE OF BIRTH:  01/17/1966   DATE OF PROCEDURE:  DATE OF DISCHARGE:                                 OPERATIVE REPORT   PROCEDURE:  1. A 37+-week intrauterine pregnancy.  2. Chronic hypertension.  3. Spontaneous assisted vaginal delivery of a 7 pound 14 ounce female infant.  4. Repair of second-degree perineal laceration.   Delivery performed by Dr. Roylene Reason. [name].   COMPLICATIONS:  None. Noted at the time of delivery is a nuchal cord x1 with  compression which is reduced.   ANALGESIA FOR DELIVERY:  The patient has a continuous lumbar epidural  analgesia placed during the course of labor.  Following delivery a total of  20 cc of 1% lidocaine plain is injected into the perineum to repair the  second-degree perineal laceration. The patient, in addition, had received a  dose of IV Nubain prior to placement of the epidural.   SPECIMENS:  1. Arterial cord gas and cord blood to laboratory.  2. Placenta is examined and noted to be apparently intact with a 3-vessel     umbilical cord.   SUMMARY:  The patient had the epidural placed after which time she was noted  to be 4 cm dilated.  Though initially she had an excellent block in place  she shortly thereafter stated that she was having increasing intensity on  the left side.  An intrauterine pressure catheter had been placed at that  time.  The patient was, thereafter, treated with a bolus of 5 cc 2%  lidocaine in an effort to facilitate epidural block.  However, the patient  thereafter progressed very rapidly to complete dilatation.  She had a strong  urge to push.  When I examined the patient she was noted to be completely  dilated with a vertex at a +3 station.  She is thus  placed in the dorsal  lithotomy position, and hurriedly prepped.  She pushed very well during the  short second stage of labor with delivery of the vertex.  Excellent perineal  support was provided but a second-degree perineal laceration was noted to  occur.  The mouth and nares were bulb suctioned of clear amniotic fluid.  Nuchal cord x1 is reduced.  Renewed expulsive efforts resulted in a  spontaneous rotation to a left anterior shoulder position.  Gentle downward  traction combined with expulsive efforts resulted in delivery of this  shoulder beneath the pubic symphysis without difficulty; as well as  remainder of the infant.  The umbilical cord is milked towards the infant.  The cord is doubly clamped, and cut.  The infant is taken to the nursery  table for immediate  assessment.  Arterial cord gases and cord blood are then  obtained.  Gentle traction on the umbilical cord results in separation  which, upon examination, appears to be an intact placenta with associated 3-  vessel umbilical cord. Excellent uterine tone is achieved.  Examination  reveals a second-degree perineal laceration which is easily repaired  utilizing #0 chromic in a running lock fashion followed by a two-layer  closure of #0 chromic on the perineal body.   The patient is thereafter taken out of the dorsal lithotomy position and  rolled to her side, at which time the epidural catheter is removed most  pertinently with the blue tip noted to be intact.                                               Langley Gauss, M.D.    DC/MEDQ  D:  07/07/2003  T:  07/07/2003  Job:  [id number]

## 2011-04-06 NOTE — Op Note (Signed)
NAME:  [name], [name]                       ACCOUNT NO.:  [id number]   MEDICAL RECORD NO.:  [id number]                   PATIENT TYPE:  AMB   LOCATION:  DAY                                  FACILITY:  [facility]   PHYSICIAN:  [name], M.D.               DATE OF BIRTH:  1966-10-21   DATE OF PROCEDURE:  07/21/2003  DATE OF DISCHARGE:                                 OPERATIVE REPORT   PREOPERATIVE DIAGNOSIS:  Cholecystitis, cholelithiasis.   POSTOPERATIVE DIAGNOSIS:  Cholecystitis, cholelithiasis.   PROCEDURE:  Laparoscopic cholecystectomy with cholangiograms.   SURGEON:  [name], M.D.   ASSISTANT:  Bernerd Limbo. Leona Carry, M.D.   ANESTHESIA:  General endotracheal.   INDICATIONS FOR PROCEDURE:  The patient is a 45 year old white female who is  one and a half weeks postpartum who suffers from biliary colic secondary to  cholelithiasis.  Her diagnosis was obtained during pregnancy.  The patient  now comes to the operating room for laparoscopic cholecystectomy.  The risks  and benefits of the procedure, including bleeding, infection, hepatobiliary  injury, and the possibility of an open procedure were fully explained to the  patient who gave informed consent.   DESCRIPTION OF PROCEDURE:  The patient was placed in the supine position.  After induction of general endotracheal anesthesia, the abdomen was prepped  and draped using the usual sterile technique with Betadine.  Surgical site  confirmation was performed.   A supraumbilical incision was made down to the fascia.  A Veress needle was  introduced into the abdominal cavity, and confirmation of placement was done  using the saline drop test.  The abdomen was then insufflated to 16 mmHg  pressure.  An 11-mm trocar was introduced into the abdominal cavity under  direct visualization without difficulty.  The patient was placed in reverse  Trendelenburg position, and an additional 11-mm trocar was placed in the  epigastric  region and 5-mm trocars were placed in the right upper quadrant  and right flank regions.  The liver was inspected and noted to be within  normal limits.  The gallbladder was retracted superiorly and laterally.  The  dissection was begun around the infundibulum of the gallbladder.  The cystic  duct was first identified.  Its juncture to the infundibulum was fully  identified.  Endoclips were placed proximally on the cystic duct, and an  incision was made into the cystic duct.  A cholangiocatheter was then  inserted, and under digital fluoroscopy, cholangiograms were obtained.  No  hepatobiliary defects were noted.  The dye flowed freely into the duodenum.  The cholangiocatheter was then removed, and multiple Endoclips were placed  distally on the cystic duct, and the cystic duct was divided.  The cystic  artery was likewise ligated and divided.  The gallbladder was freed away  from the gallbladder fossa using Bovie electrocautery.  The gallbladder was  delivered through the epigastric trocar site  using an EndoCatch bag.  The  gallbladder fossa was inspected, and no abnormal bleeding or bile leakage  was noted.  Surgicel was placed in the gallbladder fossa.  The subhepatic  space as well as right hepatic gutter were evacuated of fluid and air prior  to removal of the trocars.   All wounds were irrigated with normal saline.  All wounds were injected with  0.5% Sensorcaine.  The supraumbilical fascia was reapproximated using an 0  Vicryl interrupted suture.  All skin incisions were closed using staples.  Betadine ointment and dry sterile dressings were applied.   All tape and needle counts were correct at the end of the procedure.  The  patient was extubated in the operating room and went back to the recovery  room awake and in stable condition.   COMPLICATIONS:  None.   SPECIMENS:  Gallbladder with stone.   ESTIMATED BLOOD LOSS:  Minimal.                                                [name], M.D.    MAJ/MEDQ  D:  07/21/2003  T:  07/21/2003  Job:  [id number]   cc:   Langley Gauss, M.D.  [address]  [place]  [place] [address]  Fax: [phone]   Scott A. Gerda Diss, M.D.  [address], [address]  [place]  [place] [address]  Fax: [phone]

## 2011-04-06 NOTE — Op Note (Signed)
[name], [name]                         ACCOUNT NO.:  [id number]   MEDICAL RECORD NO.:  [id number]                  PATIENT TYPE:   LOCATION:                                       FACILITY:   PHYSICIAN:  Langley Gauss, M.D.                DATE OF BIRTH:   DATE OF PROCEDURE:  07/07/2003  DATE OF DISCHARGE:                                 OPERATIVE REPORT   PROCEDURE:  Placement of continuous lumbar epidural analgesia at the L2-L3  interspace performed by Dr. Roylene Reason. [name].   COMPLICATIONS:  None.   SUMMARY:  An appropriate and informed consent obtained.  The patient was  placed in the seated position.  The midline was identified.  The bony  landmarks were not palpable secondary to the patient's morbid obesity.  The  patient's back is sterilely prepped and draped; utilizing the epidural kit;  5 cc of lidocaine plain is injected at the midline at about the level of the  L2-L3 interspace.   The 17-gauge Touhy-Schliff needle was then utilized with loss of resistance  in air-filled glass syringe to identify the epidural space.  This was  accomplished on the first attempt without difficulty.  The epidural of note,  was buried very near to the hub.  Excellent loss of resistance is noted.  The first test dose of 5 cc of 1.5% lidocaine plus epinephrine injected  through the epidural catheter and no signs of CSF or intravascular injection  obtained.  The catheter is then inserted to a depth of 5 cm.  The epidural  needle is removed.  Aspiration test is negative.  Second test dose of 2 cc  of 1.5% lidocaine plus epinephrine injected through the epidural catheter.  Again, no signs of CSF or intravascular injection obtained.   Thus, the patient is connected to the infusion pump, containing the standard  mixture.  She will be treated with a bolus of 10 cc followed by continuous  infusion rate of 12 cc/hour.  The catheter is then secured in place.  The  patient is returned to the  lateral supine position at which time she is  examined and noted to be 4 cm dilated, completely effaced with the vertex at  a minus 1 station.  Fetal heart rate remains reassuring.  The patient did  have some moderate variable decelerations associated with the patient's  position.  The patient's position was changed most notably when she was  upright for the epidural and then upon her back for physical exam.  Upon  return to the lateral supine position fetal heart rate remains reassuring  with no additional fetal heart rate decelerations.  The patient does have  evidence of a bilateral block setting up and she is noted to be snoring  loudly upon my reevaluation in the room.  Langley Gauss, M.D.    DC/MEDQ  D:  07/07/2003  T:  07/07/2003  Job:  [id number]

## 2011-04-06 NOTE — Op Note (Signed)
[place]  Patient:    MURLINE, WEIGEL                    MRN: [id number] Proc. Date: 07/05/01 Adm. Date:  [id number] Attending:  Emeterio Reeve                           Operative Report  PREOPERATIVE DIAGNOSIS:  Right L5-S1 lumbar disk herniation.  POSTOPERATIVE DIAGNOSIS:  Right L5-S1 lumbar disk herniation.  PROCEDURE:  Right L5-S1 lumbar laminotomy and microdiskectomy.  SURGEON:  Hewitt Shorts, M.D.  ASSISTANT:  Stefani Dama, M.D.  ANESTHESIA:  General endotracheal.  INDICATION:  Patient is a 45 year old woman who presented with acute right lumbar radiculopathy, who was found by MRI scan, myelogram, and postmyelogram CT scan to have a large right L5-S1 lumbar disk herniation.  A decision was made to proceed with elective laminotomy and mcirodiskectomy.  DESCRIPTION OF PROCEDURE:  The patient was brought to the operating room, placed under general endotracheal anesthesia.  The patient was turned to a prone position.  The lumbar region was prepped with Betadine soap and solution and draped in a sterile fashion.  The midline was infiltrated with local anesthetic with epinephrine.  An x-ray was taken to localize the L5-S1 level, and a midline incision was made, carried down through the subcutaneous tissue, bipolar cautery and electrocautery used to maintain hemostasis.  Dissection was carried down to the lumbar fascia, which was incised on the right side of the midline, and the paraspinal muscles were dissected from the spinous processes and laminae in a subperiosteal fashion.  The L5-S1 interlaminar space was identified, and an x-ray was taken to confirm the localization and then the microscope was draped and brought in the field to provide additional magnification, illumination, and visualization, and the remainder of the procedure was performed using microdissection and microsurgical technique.  A laminotomy was performed  using the [facility] Max drill and Kerrison punches. Ligamentum flavum was carefully resected, and the underlying thecal sac and right S1 nerve root were identified.  These were gently retracted medially, exposing a large disk herniation free fragment.  This was removed and, in fact, a massive free fragment was gently removed from the epidural space with good decompression of the thecal sac and nerve root.  We then entered the disk space and removed additional degenerated disk material.  The epidural space was examined to ensure that all loose fragments had been removed and that good decompression of the thecal sac and nerve roots had been achieved.  Once this was confirmed and the diskectomy completed, hemostasis was established with the use of bipolar cautery, and then 2 cc of fentanyl and 80 mg of Depo-Medrol were infused into the epidural space, and then we proceeded with closure.  The deep fascia closed with interrupted undyed 0 Vicryl sutures, the subcutaneous and subcuticular were closed with interrupted inverted 2-0 undyed Vicryl sutures, and the skin edges were approximated with Dermabond.  The patient tolerated the procedure well.  The estimated blood loss was 25 cc.  Sponge and needle count were correct.  Following surgery the patient was turned back to a supine position to be reversed from the anesthetic and extubated and transferred to the PACU for further care. DD:  07/05/01 TD:  07/07/01 Job: [id number]

## 2011-04-06 NOTE — Op Note (Signed)
   [name], [name]                         ACCOUNT NO.:  [id number]   MEDICAL RECORD NO.:  [id number]                  PATIENT TYPE:   LOCATION:  LDR 1                                FACILITY:   PHYSICIAN:  Langley Gauss, M.D.                DATE OF BIRTH:   DATE OF PROCEDURE:  07/06/2003  DATE OF DISCHARGE:                                 OPERATIVE REPORT   PROCEDURE:  Placement of Foley bulb for mechanical ripening of the cervix  prior to induction performed by Dr. Roylene Reason. [name].   COMPLICATIONS:  None.   SUMMARY:  The planned procedure had been discussed with the patient.  Appropriately informed consent was obtained.  The fetal heart rate is noted  to be reassuring with a reactive nonstress test.  There is noted to be only  occasional very rare mild uterine contractions.  The patient is placed in  the dorsal lithotomy position.  The cervix is visualized and noted to be  visually about 1 cm dilated.  I was then able to easily take a 16 [place]  Foley catheter and place it through the endocervical os into the lower  uterine segment at which time it is inflated to a volume of 60 cc of sterile  normal saline.  Gentle traction then confirms the proper placement. It was  then taped to the patient's leg under gentle traction.  The procedure is  then completed.   The patient is returned to the lateral supine position at which time a  nonstress test was again in progress.  Fetal heart rate is, again, noted to  be reassuring with no change in the uterine tone; and, again, only rare  uterine contractions identified.                                               Langley Gauss, M.D.    DC/MEDQ  D:  07/06/2003  T:  07/07/2003  Job:  [id number]

## 2011-04-06 NOTE — H&P (Signed)
[name], [name]                         ACCOUNT NO.:  [id number]   MEDICAL RECORD NO.:  [id number]                  PATIENT TYPE:   LOCATION:                                       FACILITY:   PHYSICIAN:  Langley Gauss, M.D.                DATE OF BIRTH:   DATE OF ADMISSION:  07/06/2003  DATE OF DISCHARGE:                                HISTORY & PHYSICAL   REASON FOR ADMISSION:  This is a 45 year old gravida 4, para 2 at 37+ weeks  gestation who was admitted for induction of labor.  The patient is known to  have chronic hypertension during the pregnancy now worsening.  She has been  receiving twice weekly nonstress tests and one week previously was noted to  have a 1+ proteinuria.  This was noted on two occasions to include on a  clean-catch UA.  This subsequently has cleared to only trace protein with  the patient placed at bed rest.  The patient was on labetalol 100 mg p.o.  b.i.d. throughout the pregnancy and only recently on July 05, 2003 was  this dosage increased to labetalol 200 mg p.o. b.i.d. when her blood  pressure was noted to elevate to 170/110.  The patient's prenatal course was  complicated by this factor which makes her a high risk pregnancy.  She has  had serial ultrasounds which have documented a normal anatomic survey.  Her  last ultrasound was done May 26, 2003 which placed her at [redacted] weeks  gestation.  This showed her to be slightly large for gestational age at  about the 95th percentile for weight at that gestational age.   The patient was admitted for abdominal pain during this pregnancy.  Consultation was obtained with [name], M.D.  The patient was noted  to have a cholelithiasis with a slightly dilated bile duct.  She was at [redacted]  weeks gestation at that time.  As she was not toxic from her cholelithiasis  at that point in time, it was recommended that she be managed expectantly  during the pregnancy on a low fat diet and that in the  postpartum state the  cholecystectomy can be performed if clinically indicated.  The patient is  noted in addition to have the high risk factor of being 36.  Her risk of  Down syndrome based upon age alone was 1 out of 160.  She did have an  AFP/triple screen which placed her at 1 out of 460 risk.  With this  information and the risk of loss of the pregnancy with genetic amniocentesis  being greater than the risk of the infant being affected with Down syndrome,  the patient did decline amniocentesis.  The patient's pregnancy in addition  is complicated by morbid obesity.  Her prepregnancy weight 252, most recent  weight 275 with a height of 5 feet 3 inches.   ALLERGIES:  She has no  known drug allergies.   CURRENT MEDICATIONS:  Labetalol 200 mg p.o. b.i.d.  The patient discontinued  Xenical December 11, 2002 with a positive pregnancy test.  She also takes  Paxil and Prevacid.   PAST MEDICAL HISTORY:  The patient's past medical history complicated by  back surgery on two occasions for disk problems.  Both of these were in  2002.  The patient states that she does experience significant back pain and  has limited flexion in the sacral and lower lumbar region.   OBSTETRICAL HISTORY:  On March 13, 1986 a vaginal delivery, 7 pound 15 ounce  infant.  June 12, 2000 vaginal delivery of a 7 pound infant.  I cared for  her during the second pregnancy at which time she was noted to have  induction of labor at [redacted] weeks gestation for findings of chronic  hypertension.  The patient underwent mechanical ripening of cervix utilizing  a Foley bulb.  Did have an epidural placed during the course of labor and  underwent uncomplicated vaginal delivery.   SOCIAL HISTORY:  The patient is a nonsmoker.  She was employed at [employer] at the onset of the pregnancy.   PHYSICAL EXAMINATION:  GENERAL:  A pleasant but obese female.  VITAL SIGNS:  Height 5 feet 3 inches, weight 275, blood pressure  170/110 on  her left side and 140/92.  Pulse rate of 100, respiratory rate is 20.  HEENT:  Negative.  No adenopathy.  Mucous membranes are moist.  Facial edema  is noted.  NECK:  Supple.  Thyroid is nonpalpable.  LUNGS:  Clear.  CARDIOVASCULAR:  Regular rate and rhythm.  ABDOMEN:  Soft, nontender.  Large panniculus is identified with a fundal  height of 42 cm.  She is vertex presentation by Leopold's maneuvers.  Fetal  heart tones were auscultated in the 150s.  EXTREMITIES:  Noted to have 2+ pitting pretibial edema.  PELVIC:  Normal external genitalia.  No lesions or ulcerations identified.  Digital examination, cervix noted to be closed with the vertex at a minus 2  station.  Cervix is 2 cm long.  This has been followed x2 weeks duration  with no improvement in the inducibility of the cervix.   LABORATORY DATA:  Group B beta strep carrier status is negative.  Three-hour  GTT within normal limits.  GC and Chlamydia culture is negative.  Drug  screen is negative.   ASSESSMENT:  A 37+ week intrauterine pregnancy with worsening hypertension.  She does satisfy criteria for mild pregnancy-induced hypertension with 1+  proteinuria one week previously.  This has, however, cleared with a  restricted activity to bed rest.  The patient has recently increased her  dosage regimen to labetalol 200 mg p.o. b.i.d.  At this point in time, the  risks of continuing the pregnancy are greater than risks associated with  prematurity.  Thus, the patient will have labor induced at this time.  Due  to the unfavorable nature of the cervix will initially proceed with a Foley  bulb ripening of the cervix followed by removal and amniotomy.  The patient  may do well with placement of an epidural during the course of active labor.                                               Langley Gauss, M.D.  DC/MEDQ  D:  07/06/2003  T:  07/06/2003  Job:  [id number]

## 2011-04-06 NOTE — Discharge Summary (Signed)
[place]. [facility]  Patient:    [name], [name] Visit Number: [id number] MRN: [id number]          Service Type: MED Location: [id number] Attending Physician:  Emeterio Reeve Adm. Date:  06/30/2001 Disc. Date: 07/08/2001                             Discharge Summary  HISTORY OF PRESENT ILLNESS:  The patient is a 46 year old right-handed white female who is status post a left L5-S1 diskectomy in May of this year who developed increasingly severe right lumbar radicular pain.  She presented to the emergency room and was admitted by Dr. Channing Mutters.  History was notable for hypertension, treated with labetalol 100 mg b.i.d.  She also took Paxil. General examination was notable for obesity.  NEUROLOGIC EXAMINATION: Showed full strength, right S1 numbness, and absent ankle jerks bilaterally.  HOSPITAL COURSE:  The patient was admitted and underwent an MRI which showed degenerative disk disease and broad based central disk protrusion at L5-S1 but no specific cause of a right lumbar radiculopathy.  The patient was kept on bedrest with PCA morphine pump, however, despite several days of bed rest, pain persisted and MRI was reviewed once again and it was felt that there was a L5-S1 disk herniation present as well as post surgical changes on the left but the extent of the right S1 nerve root compression was uncertain and therefore we proceeded with a lumbar myelogram and post myelogram CT scan. That study showed significant right S1 nerve root compression secondary to disk herniation with a much more impressive appearance as compared to the MRI.  Therefore a decision was made to proceed with a right L5-S1 lumbar laminotomy and microdiskectomy.  This was performed on July 05, 2001, and a large free fragment of disk herniation was found.  Postoperatively she had excellent of her radicular pain.  However, she had difficulty with hypoxia.  She was managed  initially by the anesthesiologist Dr. Kipp Brood with nebulizer treatments, incentive spirometry and pulse oximetry.  She was admitted to the [facility] for observation and a pulmonary consultation was obtained from Dr. Marcelyn Bruins.  He treated her with incentive spirometry, nebulizers and flutter valve.  She is improved with improved oxygenation.  She increased her ambulation and has been able to steadily increase her ambulation activity and continues to have good relief of her pain.  Her wound is healing nicely.  She is afebrile and at this time she is being discharged to home with instructions regarding wound care and activities.  She is to return to see me in my office in 3 weeks for follow up.  She is to let us know if she needs anything before that.  She has been given a prescription for Percocet 1 to 2 tablets p.o. q.4-6h. p.r.n. pain, #40 tablets prescribed, no refills.  She is also going to continue on Lodine which she has home.  DISCHARGE DIAGNOSES: 1. Right L5-S1 lumbar disk herniation. 2. Degenerative disk disease. 3. Right lumbar radiculopathy. 4. Obesity. 5. Postoperative hypoxia. Attending Physician:  Emeterio Reeve DD:  07/08/01 TD:  07/08/01 Job: [phone] [phone]

## 2011-04-06 NOTE — H&P (Signed)
[place]. [facility]  Patient:    [name], [name]                    MRN: [id number] Adm. Date:  [id number] Attending:  Barton Fanny                         History and Physical  HISTORY OF PRESENT ILLNESS:  This patient is a 45 year old right handed white female who I had originally evaluated 2-1/2 years ago.  At that time, her MRI showed a small central right L5-S1 lumbar disc herniation.  She was doing well up until about a month ago when she developed low back pain extending into her left lower extremity through her buttock, posterior thigh and calf.  She developed numbness in the left buttock, posterior thigh and calf and to the lateral aspect of her left foot.  The pain has steadily worsened.  She has had some headaches and neck pain as well.  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  She has extensive weakness in her left foot.  PAST MEDICAL HISTORY:  Notable for history of hypertension. She takes Labetalol 100 mg b.i.d. She does not describe any history of myocardial infarction, cancer, stroke, diabetes, or lung disease. She does have a history of depression.  She has no previous surgeries.  ALLERGIES:  No known allergies.  MEDICATIONS:  Paxil 50 mg q.d. for depression. Labetalol 100 mg b.i.d.  Lodine 400 mg b.i.d.  Percocet p.r.n. for pain.  FAMILY HISTORY:  Mother is age 14 in poor health with heart disease and diabetes.  She is status post a four vessel CABG and has suffered a carotid occlusion and has peripheral vascular disease and has had some cerebrovascular disease.  Father is age 44 in fair health with history of hypertension, diabetes, peripheral vascular disease and status post right carotid endarterectomy.  SOCIAL HISTORY:  The patient is separated.  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  She does not smoke, drink alcoholic beverage or have a history of substance  abuse.  REVIEW OF SYSTEMS:  Notable for those difficulties described in her history or present illness and past medical history but is otherwise unremarkable.  PHYSICAL EXAMINATION:  GENERAL:  The patient is an obese white female in no acute distress.  VITAL SIGNS:  Temperature 98.3, pulse 80, blood pressure 124/72, respiratory rate 12.  Height 5 feet 2 inches, weight 224 pounds.  LUNGS:  Clear to auscultation.  She has symmetrical respiratory excursion.  HEART:  Regular rate and rhythm.  Normal S1 and S2. There is no murmur.  ABDOMEN: Soft and nondistended. Bowel sounds are present.  EXTREMITY:  No clubbing, cyanosis or edema.  MUSCULOSKELETAL: Shows mild tenderness to palpation in the paralumbar region on the right none over the lumbar spinous processes themselves.  She is able to flex to 60 degrees but at that point has radicular pain.  She was able to extend without discomfort.  Straight leg raising produces pain running down through the left lower extremity at about 60 degrees.  Straight leg raising is negative on the right.  NEUROLOGIC:  Shows significant weakness in her distal left lower extremity. Dorsiflexors are 1 to 2/5.  Extensor hallucis longus is 2/5.  Plantar flexors 2/5.  Strength in the right foot is 5/5 in the dorsiflexors, plantar flexors, extensor hallucis longus.  Sensation is  diminished to pinprick worse on the lateral aspect of the left foot as compared to the medial aspect of the left foot.  Sensation is intact to pinprick in the right foot.  Reflexes in the quadriceps are 2 bilaterally.  The left gastrocnemius is absent.  The toes are downgoing bilaterally.  She has a normal gait and stance.  DIAGNOSTIC STUDIES:  MRI of the lumbar spine done without gadolinium shows a large L5-S1 lumbar disc herniation central and to the left, superimposed upon degenerative disc disease and spondylosis.  IMPRESSION:  Acute left lumbar radiculopathy with pain, numbness  and weakness secondary to a large central left L5-S1 lumbar disc herniation, superimposed upon degenerative disc disease and spondylosis.  PLAN:  The patient is admitted for a L5-S1 lumbar laminotomy and microdiskectomy.  We discussed the nature of the procedure, typical length of surgery, hospital stay and overall recuperation, limitations during the postoperative period and risks of surgery including risks of infection, bleeding, possible need for transfusion, risks of nerve root dysfunction with pain, weakness, numbness or paresthesias.  The risk of recurrent disc herniation, possible need for further surgery.  The risk of dural tear with CSF leakage and possible need for further surgery.  The anesthetic risks of myocardial infarction, stroke, pneumonia and death.  We also discussed alternatives of surgery.  Understanding all of this, she does wish to proceed with surgery and is admitted for such. DD:  04/09/01 TD:  04/09/01 Job: [id number]

## 2011-04-06 NOTE — Procedures (Signed)
[name], [name]             ACCOUNT NO.:  [id number]   MEDICAL RECORD NO.:  [id number]          PATIENT TYPE:  OUT   LOCATION:  RAD                           FACILITY:  [facility]   PHYSICIAN:  Vida Roller, M.D.   DATE OF BIRTH:  1966-01-06   DATE OF PROCEDURE:  07/27/2005  DATE OF DISCHARGE:                                  ECHOCARDIOGRAM   TAPE NUMBER:  [id number]   TAPE COUNT:  [phone]   PRIMARY CARE PHYSICIAN:  Scott A. Gerda Diss, M.D.   CLINICAL INFORMATION:  This is a 45 year old with chest discomfort and  palpitations.  Technical quality of the study is adequate.   PREVIOUS STUDY:   M-MODE:  AORTA:  29 mm  (<4.0)  LEFT ATRIUM:  42 mm  (<4.0)  SEPTUM:  16 mm  (0.7-1.1)  POSTERIOR WALL:  11 mm  (0.7-1.1)  LV-DIASTOLE:  41 mm  (<5.7)  LV-SYSTOLE:  28 mm  (<4.0)  E-SEPTAL:  (<0.5)  RV-DIASTOLE:  (<2.5)  IVC:  (<2.0)   TWO-DIMENSIONAL AND DOPPLER IMAGING:  The left ventricle is normal size with  preserved LV systolic function estimated ejection fraction 60-65%.  There is  moderate left ventricular hypertrophy with some predominance in the upper  septum without LV outflow tract obstruction.   The right ventricle is normal size with normal systolic function.   The left atrium is enlarged.  The right atrium is normal size.   The aortic valve is sclerotic with no stenosis or regurgitation.   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   The tricuspid valve is trace regurgitation.   There is no pericardial effusion.   The ascending aorta and inferior vena cava were not well seen.      Vida Roller, M.D.  Electronically Signed     JH/MEDQ  D:  07/27/2005  T:  07/27/2005  Job:  [id number]   cc:   Lorin Picket A. Gerda Diss, MD  [address]  [place]  [place] [address]  Fax: [phone]

## 2011-04-06 NOTE — Group Therapy Note (Signed)
   [name], [name]                         ACCOUNT NO.:  [id number]   MEDICAL RECORD NO.:  [id number]                  PATIENT TYPE:   LOCATION:                                       FACILITY:   PHYSICIAN:  Langley Gauss, M.D.                DATE OF BIRTH:   DATE OF PROCEDURE:  07/07/2003  DATE OF DISCHARGE:                                   PROGRESS NOTE   This patient had Foley catheter placed intracervically July 06, 2003 at  about 0300 on July 07, 2003 the Foley catheter spontaneously was ejected  from the cervix and vagina with the bulb still inflated.  She was thereafter  managed expectantly while another patient was actively laboring. She  continued to have a reassuring fetal heart rate. Examination in the morning  revealed the cervix to be 3 cm dilated, minus 2 station, 60% effaced with  intact membranes. It was imperative to proceed with amniotomy and place a  fetal scalp electrode secondary to difficulty monitoring the infant  secondary to the morbid obesity in this patient.  In addition, upon  examination she is noted to have significant fat immediately suprapubically  and in the labia making examination difficulty secondary to a long reach to  the cervix.  On the fourth attempt; however, I was able to place a fetal  scalp electrode with the resultant amniotomy and findings of clear amniotic  fluid.  Fetal scalp electrode at this time has documented a reassuring fetal  heart rate.   Throughout the nighttime the patient's blood pressures have been in the 140s-  160s/90s.  She will receive labetalol 200 mg p.o. b.i.d. She has taken her  a.m. med this morning.                                               Langley Gauss, M.D.    DC/MEDQ  D:  07/07/2003  T:  07/07/2003  Job:  [id number]

## 2011-04-18 ENCOUNTER — Other Ambulatory Visit (HOSPITAL_COMMUNITY): Payer: Self-pay | Admitting: Oncology

## 2011-04-18 ENCOUNTER — Encounter (HOSPITAL_COMMUNITY): Payer: Medicaid Other | Attending: Oncology | Admitting: Oncology

## 2011-04-18 DIAGNOSIS — C50919 Malignant neoplasm of unspecified site of unspecified female breast: Secondary | ICD-10-CM

## 2011-04-18 DIAGNOSIS — E7800 Pure hypercholesterolemia, unspecified: Secondary | ICD-10-CM | POA: Insufficient documentation

## 2011-04-18 DIAGNOSIS — Z79899 Other long term (current) drug therapy: Secondary | ICD-10-CM | POA: Insufficient documentation

## 2011-04-18 DIAGNOSIS — I10 Essential (primary) hypertension: Secondary | ICD-10-CM | POA: Insufficient documentation

## 2011-04-18 DIAGNOSIS — E119 Type 2 diabetes mellitus without complications: Secondary | ICD-10-CM | POA: Insufficient documentation

## 2011-04-18 LAB — CBC
HCT: 35.1 % — ABNORMAL LOW (ref 36.0–46.0)
Hemoglobin: 11.2 g/dL — ABNORMAL LOW (ref 12.0–15.0)
MCHC: 31.9 g/dL (ref 30.0–36.0)
MCV: 82.8 fL (ref 78.0–100.0)
Platelets: 251 10*3/uL (ref 150–400)
RDW: 15.9 % — ABNORMAL HIGH (ref 11.5–15.5)
WBC: 8.2 10*3/uL (ref 4.0–10.5)

## 2011-04-18 LAB — COMPREHENSIVE METABOLIC PANEL
ALT: 16 U/L (ref 0–35)
AST: 18 U/L (ref 0–37)
Alkaline Phosphatase: 57 U/L (ref 39–117)
CO2: 28 mEq/L (ref 19–32)
Creatinine, Ser: 0.69 mg/dL (ref 0.4–1.2)
GFR calc Af Amer: >60 mL/min (ref >60)
GFR calc non Af Amer: >60 mL/min (ref >60)
Glucose, Bld: 118 mg/dL — ABNORMAL HIGH (ref 70–99)
Potassium: 3 mEq/L — ABNORMAL LOW (ref 3.5–5.1)
Sodium: 135 mEq/L (ref 135–145)
Total Bilirubin: 0.2 mg/dL — ABNORMAL LOW (ref 0.3–1.2)
Total Protein: 7.8 g/dL (ref 6.0–8.3)

## 2011-04-18 LAB — DIFFERENTIAL
Basophils Absolute: 0 10*3/uL (ref 0.0–0.1)
Basophils Relative: 0 % (ref 0–1)
Eosinophils Relative: 2 % (ref 0–5)
Lymphocytes Relative: 31 % (ref 12–46)
Monocytes Absolute: 0.6 10*3/uL (ref 0.1–1.0)
Monocytes Relative: 7 % (ref 3–12)
Neutro Abs: 4.9 10*3/uL (ref 1.7–7.7)
Neutrophils Relative %: 60 % (ref 43–77)

## 2011-04-23 ENCOUNTER — Encounter (HOSPITAL_COMMUNITY): Payer: Self-pay

## 2011-04-23 ENCOUNTER — Other Ambulatory Visit (HOSPITAL_COMMUNITY): Payer: Self-pay | Admitting: Oncology

## 2011-04-23 ENCOUNTER — Ambulatory Visit (HOSPITAL_COMMUNITY)
Admission: RE | Admit: 2011-04-23 | Discharge: 2011-04-23 | Disposition: A | Discharge status: Home / Self Care | Payer: Medicaid Other | Source: Ambulatory Visit | Attending: Oncology | Admitting: Oncology

## 2011-04-23 DIAGNOSIS — R197 Diarrhea, unspecified: Secondary | ICD-10-CM | POA: Insufficient documentation

## 2011-04-23 DIAGNOSIS — R110 Nausea: Secondary | ICD-10-CM | POA: Insufficient documentation

## 2011-04-23 DIAGNOSIS — C50919 Malignant neoplasm of unspecified site of unspecified female breast: Secondary | ICD-10-CM

## 2011-04-23 DIAGNOSIS — K449 Diaphragmatic hernia without obstruction or gangrene: Secondary | ICD-10-CM | POA: Insufficient documentation

## 2011-04-23 HISTORY — DX: Essential (primary) hypertension: I10

## 2011-04-23 MED ORDER — IOHEXOL 300 MG/ML  SOLN
100.0000 mL | Freq: Once | INTRAMUSCULAR | Status: AC | PRN
  Administered 2011-04-23: 100 mL via INTRAVENOUS

## 2011-04-24 ENCOUNTER — Encounter (HOSPITAL_COMMUNITY): Payer: Medicaid Other

## 2011-04-24 ENCOUNTER — Other Ambulatory Visit (HOSPITAL_COMMUNITY): Payer: Medicaid Other

## 2011-04-24 ENCOUNTER — Inpatient Hospital Stay (HOSPITAL_COMMUNITY): Admission: RE | Admit: 2011-04-24 | Payer: Medicaid Other | Source: Ambulatory Visit

## 2011-04-30 ENCOUNTER — Encounter (HOSPITAL_COMMUNITY)
Admission: RE | Admit: 2011-04-30 | Discharge: 2011-04-30 | Disposition: A | Discharge status: Home / Self Care | Payer: Medicaid Other | Source: Ambulatory Visit | Attending: Oncology | Admitting: Oncology

## 2011-04-30 ENCOUNTER — Ambulatory Visit (HOSPITAL_COMMUNITY)
Admission: RE | Admit: 2011-04-30 | Discharge: 2011-04-30 | Disposition: A | Discharge status: Home / Self Care | Payer: Medicaid Other | Source: Ambulatory Visit | Attending: Oncology | Admitting: Oncology

## 2011-04-30 ENCOUNTER — Encounter (HOSPITAL_COMMUNITY): Admission: RE | Admit: 2011-04-30 | Payer: Medicaid Other | Source: Ambulatory Visit

## 2011-04-30 ENCOUNTER — Encounter (HOSPITAL_COMMUNITY): Payer: Medicaid Other

## 2011-04-30 ENCOUNTER — Ambulatory Visit (HOSPITAL_COMMUNITY): Payer: Medicaid Other

## 2011-04-30 DIAGNOSIS — C50919 Malignant neoplasm of unspecified site of unspecified female breast: Secondary | ICD-10-CM

## 2011-04-30 DIAGNOSIS — Z8249 Family history of ischemic heart disease and other diseases of the circulatory system: Secondary | ICD-10-CM | POA: Insufficient documentation

## 2011-04-30 DIAGNOSIS — I517 Cardiomegaly: Secondary | ICD-10-CM

## 2011-04-30 DIAGNOSIS — I10 Essential (primary) hypertension: Secondary | ICD-10-CM | POA: Insufficient documentation

## 2011-04-30 DIAGNOSIS — R079 Chest pain, unspecified: Secondary | ICD-10-CM | POA: Insufficient documentation

## 2011-04-30 DIAGNOSIS — E119 Type 2 diabetes mellitus without complications: Secondary | ICD-10-CM | POA: Insufficient documentation

## 2011-04-30 MED ORDER — TECHNETIUM TC 99M MEDRONATE IV KIT
25.0000 | PACK | Freq: Once | INTRAVENOUS | Status: AC | PRN
  Administered 2011-04-30: 25.1 via INTRAVENOUS

## 2011-05-01 ENCOUNTER — Other Ambulatory Visit: Payer: Self-pay | Admitting: General Surgery

## 2011-05-01 ENCOUNTER — Encounter (HOSPITAL_COMMUNITY): Payer: Medicaid Other

## 2011-05-01 ENCOUNTER — Other Ambulatory Visit: Payer: Self-pay | Admitting: Infectious Diseases

## 2011-05-01 LAB — CBC
HCT: 36 % (ref 36.0–46.0)
Hemoglobin: 11.2 g/dL — ABNORMAL LOW (ref 12.0–15.0)
MCH: 26 pg (ref 26.0–34.0)
MCHC: 31.1 g/dL (ref 30.0–36.0)
MCV: 83.7 fL (ref 78.0–100.0)
Platelets: 248 10*3/uL (ref 150–400)
RBC: 4.3 MIL/uL (ref 3.87–5.11)
RDW: 16.3 % — ABNORMAL HIGH (ref 11.5–15.5)
WBC: 6.2 10*3/uL (ref 4.0–10.5)

## 2011-05-01 LAB — BASIC METABOLIC PANEL
BUN: 16 mg/dL (ref 6–23)
Calcium: 9.6 mg/dL (ref 8.4–10.5)
Chloride: 101 mEq/L (ref 96–112)
Creatinine, Ser: 0.72 mg/dL (ref 0.4–1.2)
GFR calc Af Amer: >60 mL/min (ref >60)
GFR calc non Af Amer: >60 mL/min (ref >60)

## 2011-05-01 LAB — SURGICAL PCR SCREEN: MRSA, PCR: NEGATIVE

## 2011-05-01 LAB — HCG, QUANTITATIVE, PREGNANCY: hCG, Beta Chain, Quant, S: 1 m[IU]/mL (ref <5)

## 2011-05-02 NOTE — H&P (Addendum)
  NAMECHENISE, [name]         ACCOUNT NO.:  [id number]  MEDICAL RECORD NO.:  [id number]  LOCATION:                                 FACILITY:  PHYSICIAN:  [name], M.D.  DATE OF BIRTH:  1966/08/02  DATE OF ADMISSION: DATE OF DISCHARGE:  LH                             HISTORY & PHYSICAL   CHIEF COMPLAINT:  Right breast carcinoma, need for central venous access.  HISTORY OF PRESENT ILLNESS:  The patient is a 45 year old white female status post right partial mastectomy with sentinel lymph node biopsy on Mar 21, 2011, who now presents for Port-A-Cath insertion due to the need for chemotherapy.  PAST MEDICAL HISTORY:  Unremarkable.  PAST SURGICAL HISTORY:  As noted above.  CURRENT MEDICATIONS:  None.  ALLERGIES:  No known drug allergies.  REVIEW OF SYSTEMS:  The patient denies drinking or smoking.  She denies any other cardiopulmonary difficulties or bleeding disorders.  FAMILY MEDICAL HISTORY:  There is no family medical history of breast cancer.  PHYSICAL EXAMINATION:  GENERAL:  The patient is an obese white female in no acute distress. HEENT:  Unremarkable. NECK:  Supple without lymphadenopathy. LUNGS:  Clear to auscultation with equal breath sounds bilaterally. HEART:  Reveals regular rate and rhythm without S3, S4, or murmurs. ABDOMEN:  Unremarkable. BREASTS:  Right breast examination reveals a healed scar noted.  No masses are noted.  No axillary lymphadenopathy is noted.  IMPRESSION:  Right breast carcinoma.  PLAN:  The patient is scheduled to undergo a Port-A-Cath insertion on May 04, 2011.  The risks and benefits of the procedure including bleeding, infection, and pneumothorax were fully explained to the patient, gave informed consent.     [name], M.D.     MAJ/MEDQ  D:  04/25/2011  T:  04/26/2011  Job:  [id number]  cc:   [facility] Short Stay  [facility] Department  Electronically Signed by [name] M.D. on  05/14/2011 07:28:05 AM

## 2011-05-04 ENCOUNTER — Ambulatory Visit (HOSPITAL_COMMUNITY): Payer: Medicaid Other

## 2011-05-04 ENCOUNTER — Ambulatory Visit (HOSPITAL_COMMUNITY)
Admission: RE | Admit: 2011-05-04 | Discharge: 2011-05-04 | Disposition: A | Discharge status: Home / Self Care | Payer: Medicaid Other | Source: Ambulatory Visit | Attending: General Surgery | Admitting: General Surgery

## 2011-05-04 DIAGNOSIS — C50919 Malignant neoplasm of unspecified site of unspecified female breast: Secondary | ICD-10-CM | POA: Insufficient documentation

## 2011-05-04 DIAGNOSIS — Z01812 Encounter for preprocedural laboratory examination: Secondary | ICD-10-CM | POA: Insufficient documentation

## 2011-05-04 DIAGNOSIS — Z01818 Encounter for other preprocedural examination: Secondary | ICD-10-CM | POA: Insufficient documentation

## 2011-05-04 LAB — GLUCOSE, CAPILLARY: Glucose-Capillary: 121 mg/dL — ABNORMAL HIGH (ref 70–99)

## 2011-05-10 ENCOUNTER — Encounter (HOSPITAL_COMMUNITY): Payer: Medicaid Other | Attending: Oncology

## 2011-05-10 DIAGNOSIS — Z79899 Other long term (current) drug therapy: Secondary | ICD-10-CM | POA: Insufficient documentation

## 2011-05-10 DIAGNOSIS — E119 Type 2 diabetes mellitus without complications: Secondary | ICD-10-CM | POA: Insufficient documentation

## 2011-05-10 DIAGNOSIS — E7800 Pure hypercholesterolemia, unspecified: Secondary | ICD-10-CM | POA: Insufficient documentation

## 2011-05-10 DIAGNOSIS — C50919 Malignant neoplasm of unspecified site of unspecified female breast: Secondary | ICD-10-CM | POA: Insufficient documentation

## 2011-05-10 DIAGNOSIS — I10 Essential (primary) hypertension: Secondary | ICD-10-CM | POA: Insufficient documentation

## 2011-05-11 ENCOUNTER — Other Ambulatory Visit (HOSPITAL_COMMUNITY): Payer: Self-pay | Admitting: Oncology

## 2011-05-11 ENCOUNTER — Encounter (HOSPITAL_COMMUNITY): Payer: Medicaid Other

## 2011-05-11 DIAGNOSIS — Z5111 Encounter for antineoplastic chemotherapy: Secondary | ICD-10-CM

## 2011-05-11 DIAGNOSIS — C50919 Malignant neoplasm of unspecified site of unspecified female breast: Secondary | ICD-10-CM

## 2011-05-11 LAB — COMPREHENSIVE METABOLIC PANEL
ALT: 12 U/L (ref 0–35)
AST: 14 U/L (ref 0–37)
Albumin: 3.3 g/dL — ABNORMAL LOW (ref 3.5–5.2)
Alkaline Phosphatase: 62 U/L (ref 39–117)
BUN: 11 mg/dL (ref 6–23)
Calcium: 9.4 mg/dL (ref 8.4–10.5)
Chloride: 103 mEq/L (ref 96–112)
GFR calc Af Amer: >60 mL/min (ref >60)
Glucose, Bld: 125 mg/dL — ABNORMAL HIGH (ref 70–99)
Potassium: 3.9 mEq/L (ref 3.5–5.1)
Sodium: 141 mEq/L (ref 135–145)
Total Protein: 7 g/dL (ref 6.0–8.3)

## 2011-05-11 LAB — DIFFERENTIAL
Basophils Absolute: 0 10*3/uL (ref 0.0–0.1)
Basophils Relative: 0 % (ref 0–1)
Lymphocytes Relative: 29 % (ref 12–46)
Monocytes Absolute: 0.5 10*3/uL (ref 0.1–1.0)
Monocytes Relative: 7 % (ref 3–12)
Neutro Abs: 4.1 10*3/uL (ref 1.7–7.7)
Neutrophils Relative %: 61 % (ref 43–77)

## 2011-05-11 LAB — CBC
HCT: 34.7 % — ABNORMAL LOW (ref 36.0–46.0)
Hemoglobin: 10.7 g/dL — ABNORMAL LOW (ref 12.0–15.0)
MCH: 25.8 pg — ABNORMAL LOW (ref 26.0–34.0)
MCHC: 30.8 g/dL (ref 30.0–36.0)
RBC: 4.15 MIL/uL (ref 3.87–5.11)
RDW: 16.2 % — ABNORMAL HIGH (ref 11.5–15.5)
WBC: 6.7 10*3/uL (ref 4.0–10.5)

## 2011-05-14 NOTE — Op Note (Signed)
  [name], GALI         ACCOUNT NO.:  [id number]  MEDICAL RECORD NO.:  [id number]  LOCATION:  DAYP                          FACILITY:  [facility]  PHYSICIAN:  [name], M.D.  DATE OF BIRTH:  09-04-1966  DATE OF PROCEDURE:  05/04/2011 DATE OF DISCHARGE:                              OPERATIVE REPORT   PREOPERATIVE DIAGNOSIS:  Right breast carcinoma.  POSTOPERATIVE DIAGNOSIS:  Right breast carcinoma.  PROCEDURE:  Port-A-Cath insertion.  SURGEON:  [name], MD  ANESTHESIA:  MAC.  INDICATIONS:  The patient is a 45 year old white female who was recently diagnosed with right breast carcinoma who now presents for Port-A-Cath insertion due to the need for chemotherapy.  The risks and benefits of the procedure including bleeding, infection, pneumothorax were fully explained to the patient, gave informed consent.  PROCEDURE NOTE:  The patient was placed in the supine position.  After monitored anesthesia care was given, the left upper chest was prepped and draped using the usual sterile technique with DuraPrep.  Surgical site confirmation was performed.  Xylocaine 1% was used for local anesthesia.  An incision was made below the left clavicle.  Subcutaneous pocket was then formed.  A needle was advanced into the left subclavian vein using the Seldinger technique without difficulty.  A guidewire was then advanced into the right atrium under fluoroscopic guidance.  An introducer and peel-away sheath were placed over the guidewire.  A power port catheter was then inserted, and the peel-away sheath was removed.  The catheter was then sized and attached to the power port.  The power port was placed in subcutaneous pocket.  Adequate positioning was confirmed by fluoroscopy.  The port was flushed with heparin flush.  Good backflow was noted in the port. The subcutaneous layer was reapproximated using a 3-0 Vicryl interrupted suture.  The skin was closed using a 4-0  Vicryl subcuticular suture. Dermabond was then applied.  All sponge, instrument, and needle counts were correct at the end of the procedure.  The patient was transferred to PACU in stable condition. Chest x-ray will be performed at that time.  COMPLICATIONS:  None.  SPECIMEN:  None.  BLOOD LOSS:  Minimal.     [name], M.D.     MAJ/MEDQ  D:  05/04/2011  T:  05/05/2011  Job:  [id number]  cc:   [facility] Department  [name], MD Fax: [phone]  Electronically Signed by [name] M.D. on 05/14/2011 07:28:07 AM

## 2011-05-16 ENCOUNTER — Encounter (HOSPITAL_COMMUNITY): Payer: Medicaid Other

## 2011-05-16 ENCOUNTER — Other Ambulatory Visit (HOSPITAL_COMMUNITY): Payer: Self-pay | Admitting: Oncology

## 2011-05-16 DIAGNOSIS — R630 Anorexia: Secondary | ICD-10-CM

## 2011-05-16 LAB — BASIC METABOLIC PANEL
BUN: 10 mg/dL (ref 6–23)
CO2: 27 mEq/L (ref 19–32)
Calcium: 8.5 mg/dL (ref 8.4–10.5)
Chloride: 101 mEq/L (ref 96–112)
Creatinine, Ser: 0.58 mg/dL (ref 0.50–1.10)
GFR calc Af Amer: >60 mL/min (ref >60)
Potassium: 3.5 mEq/L (ref 3.5–5.1)
Sodium: 136 mEq/L (ref 135–145)

## 2011-05-18 ENCOUNTER — Other Ambulatory Visit (HOSPITAL_COMMUNITY): Payer: Self-pay | Admitting: Oncology

## 2011-05-18 LAB — CLOSTRIDIUM DIFFICILE BY PCR: Toxigenic C. Difficile by PCR: NEGATIVE

## 2011-05-18 LAB — POCT OCCULT BLOOD STOOL (DEVICE)
Fecal Occult Bld: NEGATIVE
Fecal Occult Bld: NEGATIVE
Fecal Occult Bld: NEGATIVE

## 2011-05-22 ENCOUNTER — Other Ambulatory Visit (HOSPITAL_COMMUNITY): Payer: Self-pay | Admitting: Oncology

## 2011-05-22 ENCOUNTER — Other Ambulatory Visit: Payer: Self-pay | Admitting: Oncology

## 2011-05-22 ENCOUNTER — Encounter: Payer: Self-pay | Admitting: Oncology

## 2011-05-22 DIAGNOSIS — C50911 Malignant neoplasm of unspecified site of right female breast: Secondary | ICD-10-CM

## 2011-05-22 DIAGNOSIS — C50919 Malignant neoplasm of unspecified site of unspecified female breast: Secondary | ICD-10-CM

## 2011-05-22 HISTORY — DX: Malignant neoplasm of unspecified site of unspecified female breast: C50.919

## 2011-05-22 HISTORY — DX: Malignant neoplasm of unspecified site of right female breast: C50.911

## 2011-05-28 ENCOUNTER — Telehealth (HOSPITAL_COMMUNITY): Payer: Self-pay | Admitting: *Deleted

## 2011-05-28 ENCOUNTER — Other Ambulatory Visit (HOSPITAL_COMMUNITY): Payer: Self-pay | Admitting: Oncology

## 2011-05-28 DIAGNOSIS — C50919 Malignant neoplasm of unspecified site of unspecified female breast: Secondary | ICD-10-CM

## 2011-05-28 MED ORDER — ONDANSETRON HCL 8 MG PO TABS: 8.0000 mg | ORAL_TABLET | Freq: Three times a day (TID) | ORAL | Status: DC | PRN

## 2011-05-28 NOTE — Telephone Encounter (Signed)
Zofran 8mg  po TID prn #20 called into [facility].

## 2011-05-31 ENCOUNTER — Encounter (HOSPITAL_COMMUNITY): Payer: Medicaid Other | Attending: Oncology | Admitting: Oncology

## 2011-05-31 ENCOUNTER — Encounter (HOSPITAL_COMMUNITY): Payer: Self-pay

## 2011-05-31 ENCOUNTER — Encounter (HOSPITAL_BASED_OUTPATIENT_CLINIC_OR_DEPARTMENT_OTHER): Payer: Medicaid Other

## 2011-05-31 ENCOUNTER — Ambulatory Visit (HOSPITAL_COMMUNITY): Payer: Medicaid Other | Admitting: Oncology

## 2011-05-31 DIAGNOSIS — C50919 Malignant neoplasm of unspecified site of unspecified female breast: Secondary | ICD-10-CM

## 2011-05-31 DIAGNOSIS — K921 Melena: Secondary | ICD-10-CM | POA: Insufficient documentation

## 2011-05-31 DIAGNOSIS — R82998 Other abnormal findings in urine: Secondary | ICD-10-CM | POA: Insufficient documentation

## 2011-05-31 DIAGNOSIS — R112 Nausea with vomiting, unspecified: Secondary | ICD-10-CM | POA: Insufficient documentation

## 2011-05-31 DIAGNOSIS — R197 Diarrhea, unspecified: Secondary | ICD-10-CM | POA: Insufficient documentation

## 2011-05-31 LAB — DIFFERENTIAL
Basophils Relative: 1 % (ref 0–1)
Eosinophils Relative: 2 % (ref 0–5)
Lymphocytes Relative: 32 % (ref 12–46)
Lymphs Abs: 2 10*3/uL (ref 0.7–4.0)
Monocytes Absolute: 0.6 10*3/uL (ref 0.1–1.0)
Monocytes Relative: 9 % (ref 3–12)
Neutro Abs: 3.4 10*3/uL (ref 1.7–7.7)
Neutrophils Relative %: 56 % (ref 43–77)

## 2011-05-31 LAB — BASIC METABOLIC PANEL
BUN: 8 mg/dL (ref 6–23)
CO2: 26 mEq/L (ref 19–32)
Chloride: 100 mEq/L (ref 96–112)
Creatinine, Ser: 0.8 mg/dL (ref 0.50–1.10)
GFR calc Af Amer: >60 mL/min (ref >60)
GFR calc non Af Amer: >60 mL/min (ref >60)
Sodium: 141 mEq/L (ref 135–145)

## 2011-05-31 LAB — CBC
HCT: 36.1 % (ref 36.0–46.0)
Hemoglobin: 11.2 g/dL — ABNORMAL LOW (ref 12.0–15.0)
MCH: 25.7 pg — ABNORMAL LOW (ref 26.0–34.0)
MCHC: 31 g/dL (ref 30.0–36.0)
Platelets: 424 10*3/uL — ABNORMAL HIGH (ref 150–400)
RBC: 4.35 MIL/uL (ref 3.87–5.11)
RDW: 16.7 % — ABNORMAL HIGH (ref 11.5–15.5)
WBC: 6.1 10*3/uL (ref 4.0–10.5)

## 2011-05-31 LAB — MAGNESIUM: Magnesium: 1.9 mg/dL (ref 1.5–2.5)

## 2011-05-31 NOTE — Progress Notes (Signed)
Jill Burrow, MD [facility] [address], [address] [place] [address]  1. Breast cancer  Magnesium, 2D Echocardiogram, Magnesium, Magnesium    CURRENT THERAPY: S/P 1 cycle of AC.  She will undergo 4 cycles of AC followed by Taxol weekly, then radiation, followed with Tamoxifen x 5 years for minimally positive PR.  INTERVAL HISTORY: [name] 45 y.o. female returns for followup of Stage II Breast cancer.  She denies any complaints with her first cycle of chemotherapy.  She does admit to some nausea that zofran helped with.  She asked if there was a stronger dose but she is on the 8 mg dose.  She is uncomfortable about the fact that she is losing her hair.  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  She was appreciative of the ideas which included a bandana and a sunhat.    She appears to have a strong friend support system who is helping her with this issue.  She does seem to have self-esteem issues as well.    She is on paxil and she will continue with this medication.  She does find herself being emotional at times.  Not enough to increase her antidepressant dosage at this point.  She will continue to work through her emotional issues.  When asked if there is one thing I could fix today, what would it be?, her response was fix my hair loss.  This may be a problem in the future when she loses the entirety of her hair. Will keep a close eye on this.  Past Medical History  Diagnosis Date  . Diabetes mellitus   . Hypertension   . Breast cancer 05/22/2011    03/01/11    has Breast cancer on her problem list.      has no known allergies.  [name] does not currently have medications on file.  Past Surgical History  Procedure Date  . Back surg x2   . Cholecystectomy   . Mm breast stereo bx*l*r/s     rt.  . Mastectomy partial / lumpectomy     rt.  . Colonoscopy     Patient denies any headaches, dizziness, double  vision, fevers, chills, night sweats, nausea, vomiting, diarrhea, constipation, chest pain, heart palpitations, shortness of breath, blood in stool, black tarry stool, urinary pain, urinary burning, urinary frequency, hematuria.   PHYSICAL EXAMINATION  There were no vitals filed for this visit.  GENERAL:alert, healthy, well nourished, well developed, comfortable and obese SKIN: skin color, texture, turgor are normal, no rashes or significant lesions HEAD: Normocephalic, No masses, lesions, tenderness or abnormalities EYES: normal EARS: External ears normal OROPHARYNX:no exudate and no erythema  NECK: supple, no adenopathy, no bruits, thyroid normal size, non-tender, without nodularity, non-tender, trachea midline LYMPH:  no palpable lymphadenopathy, no hepatosplenomegaly BREAST:left breast normal without mass, skin or nipple changes or axillary nodes, right breast reveals well healed incisional scar just superior to the areola and a 2 x 2.5 cm hematoma mass at lumpectomy site. LUNGS: clear to auscultation and percussion HEART: regular rate & rhythm, no murmurs, no gallops, S1 normal and S2 normal ABDOMEN:abdomen soft, non-tender, obese, normal bowel sounds and no masses or organomegaly BACK: Back symmetric, no curvature., No CVA tenderness EXTREMITIES:less then 2 second capillary refill, no joint deformities, effusion, or inflammation, no edema, no skin discoloration, right pedal tattoo noted.  NEURO: alert & oriented x 3 with fluent speech, no focal motor/sensory deficits, gait normal  LABORATORY DATA:  Lab Results  Component Value Date   WBC 6.1 05/31/2011   HGB 11.2* 05/31/2011   HCT 36.1 05/31/2011   MCV 83.0 05/31/2011   PLT 424* 05/31/2011     Chemistry      Component Value Date/Time   NA 141 05/31/2011 1331   K 3.0* 05/31/2011 1331   CL 100 05/31/2011 1331   CO2 26 05/31/2011 1331   BUN 8 05/31/2011 1331   CREATININE 0.80 05/31/2011 1331      Component Value Date/Time   CALCIUM  9.0 05/31/2011 1331   ALKPHOS 62 05/11/2011 0945   AST 14 05/11/2011 0945   ALT 12 05/11/2011 0945   BILITOT 0.3 05/11/2011 0945     ASSESSMENT:  1. Right sided breast cancer.  Stage II.  3.8 cm mass.  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  No LVI identified and a negative sentinel node.  ER 0%, PR 2%, Ki-67 90%, Her2 negative.  CT of CAp and bone scan negative for disease. 2. Nausea secondary to chemotherapy 3. Depression, on Paxil 4. Obesity   PLAN:  1. CBC diff, BMET, and Magnesium today in preparation for cycle 2 of chemotherapy. 2. 2D echo after cycle 3 of AC 3. Will keep a close eye on emotional status of the patient.  May consider increasing antidepressant medication. 4. Return in 3 weeks for follow-up and pre chemotherapy lab work.  All questions were answered. The patient knows to call the clinic with any problems, questions or concerns. We can certainly see the patient much sooner if necessary.  I spent 25 minutes counseling the patient face to face. The total time spent in the appointment was 40 minutes.  ,

## 2011-06-01 ENCOUNTER — Other Ambulatory Visit (HOSPITAL_COMMUNITY): Payer: Self-pay | Admitting: Oncology

## 2011-06-01 ENCOUNTER — Encounter (HOSPITAL_BASED_OUTPATIENT_CLINIC_OR_DEPARTMENT_OTHER): Payer: Medicaid Other

## 2011-06-01 VITALS — BP 129/86 | HR 72 | Temp 97.2°F | Wt 245.0 lb

## 2011-06-01 DIAGNOSIS — E876 Hypokalemia: Secondary | ICD-10-CM

## 2011-06-01 DIAGNOSIS — Z5111 Encounter for antineoplastic chemotherapy: Secondary | ICD-10-CM

## 2011-06-01 DIAGNOSIS — C50919 Malignant neoplasm of unspecified site of unspecified female breast: Secondary | ICD-10-CM

## 2011-06-01 MED ORDER — DOXORUBICIN HCL CHEMO IV INJECTION 2 MG/ML
68.0000 mg | Freq: Once | INTRAVENOUS | Status: AC
  Administered 2011-06-01 (×2): 68 mg via INTRAVENOUS
  Filled 2011-06-01: qty 34

## 2011-06-01 MED ORDER — SODIUM CHLORIDE 0.9 % IV SOLN: 150.0000 mg | Freq: Once | INTRAVENOUS | Status: DC

## 2011-06-01 MED ORDER — DOXORUBICIN HCL CHEMO IV INJECTION 2 MG/ML: 60.0000 mg/m2 | Freq: Once | INTRAVENOUS | Status: DC

## 2011-06-01 MED ORDER — SODIUM CHLORIDE 0.9 % IJ SOLN: 3.0000 mL | INTRAMUSCULAR | Status: DC | PRN

## 2011-06-01 MED ORDER — SODIUM CHLORIDE 0.9 % IV SOLN
Freq: Once | INTRAVENOUS | Status: AC
  Administered 2011-06-01: 10:00:00 via INTRAVENOUS
  Filled 2011-06-01: qty 5

## 2011-06-01 MED ORDER — SODIUM CHLORIDE 0.9 % IV SOLN
600.0000 mg/m2 | Freq: Once | INTRAVENOUS | Status: AC
  Administered 2011-06-01: 1360 mg via INTRAVENOUS
  Filled 2011-06-01: qty 68

## 2011-06-01 MED ORDER — SODIUM CHLORIDE 0.9 % IJ SOLN
10.0000 mL | INTRAMUSCULAR | Status: DC | PRN
  Administered 2011-06-01: 10 mL

## 2011-06-01 MED ORDER — POTASSIUM CHLORIDE CRYS ER 20 MEQ PO TBCR: EXTENDED_RELEASE_TABLET | ORAL | Status: DC

## 2011-06-01 MED ORDER — SODIUM CHLORIDE 0.9 % IV SOLN
Freq: Once | INTRAVENOUS | Status: AC
  Administered 2011-06-01: 09:00:00 via INTRAVENOUS

## 2011-06-01 MED ORDER — SODIUM CHLORIDE 0.9 % IV SOLN: Freq: Once | INTRAVENOUS | Status: DC

## 2011-06-01 MED ORDER — COLD PACK MISC ONCOLOGY: 1.0000 | Freq: Once | Status: DC | PRN

## 2011-06-01 MED ORDER — HEPARIN SOD (PORK) LOCK FLUSH 100 UNIT/ML IV SOLN: 250.0000 [IU] | Freq: Once | INTRAVENOUS | Status: DC | PRN

## 2011-06-01 MED ORDER — PALONOSETRON HCL INJECTION 0.25 MG/5ML
0.2500 mg | Freq: Once | INTRAVENOUS | Status: AC
  Administered 2011-06-01: 0.25 mg via INTRAVENOUS
  Filled 2011-06-01: qty 5

## 2011-06-01 MED ORDER — LORAZEPAM 2 MG/ML IJ SOLN
1.0000 mg | Freq: Once | INTRAMUSCULAR | Status: AC
  Administered 2011-06-01: 1 mg via INTRAVENOUS

## 2011-06-01 MED ORDER — DOXORUBICIN HCL CHEMO IV INJECTION 2 MG/ML
68.0000 mg | Freq: Once | INTRAVENOUS | Status: DC
  Filled 2011-06-01: qty 34

## 2011-06-01 MED ORDER — LORAZEPAM 2 MG/ML IJ SOLN
INTRAMUSCULAR | Status: AC
  Filled 2011-06-01: qty 1

## 2011-06-01 MED ORDER — SODIUM CHLORIDE 0.9 % IJ SOLN
INTRAMUSCULAR | Status: AC
  Filled 2011-06-01: qty 10

## 2011-06-01 MED ORDER — HEPARIN SOD (PORK) LOCK FLUSH 100 UNIT/ML IV SOLN
500.0000 [IU] | Freq: Once | INTRAVENOUS | Status: AC | PRN
  Administered 2011-06-01: 500 [IU]

## 2011-06-01 MED ORDER — ALTEPLASE 2 MG IJ SOLR: 2.0000 mg | Freq: Once | INTRAMUSCULAR | Status: DC | PRN

## 2011-06-01 MED ORDER — DEXAMETHASONE SODIUM PHOSPHATE 4 MG/ML IJ SOLN: 20.0000 mg | Freq: Once | INTRAMUSCULAR | Status: DC

## 2011-06-01 MED ORDER — HEPARIN SOD (PORK) LOCK FLUSH 100 UNIT/ML IV SOLN
INTRAVENOUS | Status: AC
  Administered 2011-06-01: 500 [IU]
  Filled 2011-06-01: qty 5

## 2011-06-01 NOTE — Progress Notes (Signed)
[name] presented for labwork. Labs per MD order drawn via Peripheral Line butter fly  gauge needle inserted in left hand Procedure without incident.  Needle removed intact. Patient tolerated procedure well.

## 2011-06-08 ENCOUNTER — Encounter: Payer: Medicaid Other | Admitting: Genetic Counselor

## 2011-06-11 ENCOUNTER — Encounter (HOSPITAL_BASED_OUTPATIENT_CLINIC_OR_DEPARTMENT_OTHER): Payer: Medicaid Other | Admitting: Oncology

## 2011-06-11 VITALS — BP 124/73 | HR 89 | Temp 97.8°F | Wt 242.0 lb

## 2011-06-11 DIAGNOSIS — R82998 Other abnormal findings in urine: Secondary | ICD-10-CM

## 2011-06-11 DIAGNOSIS — K921 Melena: Secondary | ICD-10-CM

## 2011-06-11 DIAGNOSIS — R112 Nausea with vomiting, unspecified: Secondary | ICD-10-CM

## 2011-06-11 DIAGNOSIS — R197 Diarrhea, unspecified: Secondary | ICD-10-CM

## 2011-06-11 DIAGNOSIS — C50919 Malignant neoplasm of unspecified site of unspecified female breast: Secondary | ICD-10-CM

## 2011-06-11 LAB — DIFFERENTIAL
Basophils Absolute: 0 10*3/uL (ref 0.0–0.1)
Basophils Relative: 2 % — ABNORMAL HIGH (ref 0–1)
Eosinophils Absolute: 0 10*3/uL (ref 0.0–0.7)
Eosinophils Relative: 1 % (ref 0–5)
Lymphs Abs: 0.9 10*3/uL (ref 0.7–4.0)
Monocytes Absolute: 0.2 10*3/uL (ref 0.1–1.0)
Monocytes Relative: 7 % (ref 3–12)
Neutro Abs: 1.2 10*3/uL — ABNORMAL LOW (ref 1.7–7.7)
Neutrophils Relative %: 52 % (ref 43–77)

## 2011-06-11 LAB — CBC
HCT: 33.8 % — ABNORMAL LOW (ref 36.0–46.0)
MCH: 26.3 pg (ref 26.0–34.0)
MCHC: 31.7 g/dL (ref 30.0–36.0)
MCV: 83 fL (ref 78.0–100.0)
Platelets: 176 10*3/uL (ref 150–400)
RDW: 16.1 % — ABNORMAL HIGH (ref 11.5–15.5)
WBC: 2.3 10*3/uL — ABNORMAL LOW (ref 4.0–10.5)

## 2011-06-11 LAB — BASIC METABOLIC PANEL
Calcium: 9.1 mg/dL (ref 8.4–10.5)
Chloride: 101 mEq/L (ref 96–112)
Creatinine, Ser: 0.73 mg/dL (ref 0.50–1.10)
GFR calc Af Amer: >60 mL/min (ref >60)
GFR calc non Af Amer: >60 mL/min (ref >60)
Potassium: 3.3 mEq/L — ABNORMAL LOW (ref 3.5–5.1)
Sodium: 141 mEq/L (ref 135–145)

## 2011-06-11 LAB — URINALYSIS, ROUTINE W REFLEX MICROSCOPIC
Bilirubin Urine: NEGATIVE
Glucose, UA: NEGATIVE mg/dL
Hgb urine dipstick: NEGATIVE
Nitrite: NEGATIVE
Specific Gravity, Urine: 1.025 (ref 1.005–1.030)
Urobilinogen, UA: 0.2 mg/dL (ref 0.0–1.0)
pH: 6 (ref 5.0–8.0)

## 2011-06-11 LAB — URINE MICROSCOPIC-ADD ON

## 2011-06-11 MED ORDER — ONDANSETRON 8 MG PO TBDP: 8.0000 mg | ORAL_TABLET | Freq: Three times a day (TID) | ORAL | Status: DC | PRN

## 2011-06-11 MED ORDER — SULFAMETHOXAZOLE-TRIMETHOPRIM 800-160 MG PO TABS: 1.0000 | ORAL_TABLET | Freq: Two times a day (BID) | ORAL | Status: AC

## 2011-06-11 NOTE — Progress Notes (Signed)
Jill Burrow, MD, MD [facility] [address] [place] [address]  1. Breast cancer  CBC, Differential, Basic metabolic panel, CBC, Differential, Basic metabolic panel, Ferritin, ondansetron (ZOFRAN-ODT) 8 MG disintegrating tablet  2. Nausea & vomiting  ondansetron (ZOFRAN-ODT) 8 MG disintegrating tablet  3. Diarrhea  Clostridium difficile culture-fecal  4. Blood in stool  Clostridium difficile culture-fecal    CURRENT THERAPY: S/P cycle 2 of AC on 06/01/11.  INTERVAL HISTORY: [name] 45 y.o. female returns as a walk-in for diarrhea, nausea, and vomiting.  Today she looks well but complains of moving her bowels approximately 6 times daily.  This began approximately 1 1/2 weeks ago.  She admits to occasional blood in her stool and on the toilet paper.  She denies any black tarry stools.  She reports that these bowel movements are "foul" smelling.  She explains that today she has not yet had a bowel movement but does admit to "foul" smelling flatulence.  She has tried Imodium at home, but this has not helped.  She is taking this medication as directed on the medication bottle.  She reports a "low-grade" fever but denies ever taking her temperature at home.  She explains that she felt warm.  She explains that she has not been able to keep food down without feeling nauseated and/or vomiting.  She reports that she is taking in fluids but mostly sweet tea.  She also have been consuming soda products.  These, for the most part, contain caffeine and therefore cannot be counted as fluids.  She admits to a decrease urinary output.  We will get a UA on her today.  At the end of our visit, she was seen "dry heaving" into a trash can.    Past Medical History  Diagnosis Date  . Diabetes mellitus   . Hypertension   . Breast cancer 05/22/2011    03/01/11    has Breast cancer on her problem list.      has no known allergies.  [name] does not currently have  medications on file.  Past Surgical History  Procedure Date  . Back surg x2   . Cholecystectomy   . Mm breast stereo bx*l*r/s     rt.  . Mastectomy partial / lumpectomy     rt.  . Colonoscopy     Denies any headaches, dizziness, double vision, night sweats, constipation, chest pain, heart palpitations, shortness of breath, black tarry stool, urinary pain, urinary burning, urinary frequency, hematuria.   PHYSICAL EXAMINATION  ECOG PERFORMANCE STATUS: 1 - Symptomatic but completely ambulatory  Filed Vitals:   06/11/11 1212  BP: 124/73  Pulse: 89  Temp: 97.8 F (36.6 C)    GENERAL:alert, comfortable, cooperative and obese SKIN: skin color, texture, turgor are normal, no rashes or significant lesions HEAD: Normocephalic, No masses, lesions, tenderness or abnormalities EYES: normal EARS: External ears normal OROPHARYNX:not examined  NECK: trachea midline LYMPH:  not examined BREAST:not examined LUNGS: clear to auscultation and percussion HEART: regular rate & rhythm, no murmurs, no gallops, S1 normal and S2 normal ABDOMEN:abdomen soft, non-tender, obese and normal bowel sounds BACK: Back symmetric, no curvature. EXTREMITIES:less then 2 second capillary refill, no joint deformities, effusion, or inflammation, no edema, no skin discoloration  NEURO: alert & oriented x 3 with fluent speech, no focal motor/sensory deficits, gait normal   LABORATORY DATA: Lab Results  Component Value Date   WBC 2.3* 06/11/2011   HGB 10.7* 06/11/2011   HCT 33.8* 06/11/2011  MCV 83.0 06/11/2011   PLT 176 06/11/2011     Chemistry      Component Value Date/Time   NA 141 06/11/2011 1233   K 3.3* 06/11/2011 1233   CL 101 06/11/2011 1233   CO2 30 06/11/2011 1233   BUN 7 06/11/2011 1233   CREATININE 0.73 06/11/2011 1233      Component Value Date/Time   CALCIUM 9.1 06/11/2011 1233   ALKPHOS 62 05/11/2011 0945   AST 14 05/11/2011 0945   ALT 12 05/11/2011 0945   BILITOT 0.3 05/11/2011 0945       PATHOLOGY: 1. Right Breast Lumpectomy- Invasive ductal carcinoma, 3.8 cm, grade III, no LVI identified, resection margins clear. 2. Right Sentinel Node biopsy- Negative for metastatic carcinoma    ASSESSMENT:  1. Breast Cancer, s/p cycle 2 of AC on 06/01/11 2. Nausea and vomiting 3. Diarrhea 4. Blood in stool 5. Leukocyte esterase in Urine, possible UTI 6. Hypokalemia   PLAN:  1. CBC, Ferritin, BMET, UA on arrival 2. Rx for Zofran ODT given 3. Rx for Septra DS x 7 days e-prescribed to [facility] in [place]. 4. Three stool cards given 5. Stool Culture for C. Diff 6. Encouraged to push fluids at home.  BRAT diet.   7. Encouraged her to take her KCl pills at home.  I offered her liquid KCl but she has declined at this point in time. 8. I have asked her to call me later this week.   All questions were answered. The patient knows to call the clinic with any problems, questions or concerns. We can certainly see the patient much sooner if necessary.  The patient and plan discussed with Glenford Peers, MD and he is in agreement with the aforementioned.  I spent 15 minutes counseling the patient face to face. The total time spent in the appointment was 30 minutes.  ,

## 2011-06-11 NOTE — Patient Instructions (Addendum)
[facility]  Discharge Instructions  RECOMMENDATIONS MADE BY THE CONSULTANT AND ANY TEST RESULTS WILL BE SENT TO YOUR REFERRING DOCTOR.   EXAM FINDINGS BY MD TODAY AND SIGNS AND SYMPTOMS TO REPORT TO CLINIC OR PRIMARY MD:  Fever, night sweats, vomiting. Let us know if you are not feeling better by Wednesday.   MEDICATIONS PRESCRIBED: Septra (antibiotic) take as prescribed for UTI  INSTRUCTIONS GIVEN AND DISCUSSED: Potassium level is 3.3 which is slightly low. Please push fluids the next few days and drink Gatorade or Powerade. You must take your Potassium tablet daily as prescribed. If you can't tolerate the pill, let us know and we will switch you to the liquid form. However, the liquid form is nasty tasting!  SPECIAL INSTRUCTIONS/FOLLOW-UP: Please complete 3 stool cards and put a sample of stool in the cup provided and bring back to the Cancer Center.    I acknowledge that I have been informed and understand all the instructions given to me and received a copy. I do not have any more questions at this time, but understand that I may call the [facility] at [facility] at [phone] during business hours should I have any further questions or need assistance in obtaining follow-up care.    __________________________________________  _____________  __________ Signature of Patient or Authorized Representative            Date                   Time    __________________________________________ Nurse's Signature

## 2011-06-13 LAB — URINE CULTURE

## 2011-06-21 ENCOUNTER — Other Ambulatory Visit (HOSPITAL_COMMUNITY): Payer: Medicaid Other

## 2011-06-21 ENCOUNTER — Ambulatory Visit (HOSPITAL_COMMUNITY): Payer: Medicaid Other | Admitting: Oncology

## 2011-06-22 ENCOUNTER — Encounter (HOSPITAL_BASED_OUTPATIENT_CLINIC_OR_DEPARTMENT_OTHER): Payer: Medicaid Other | Admitting: Oncology

## 2011-06-22 ENCOUNTER — Encounter (HOSPITAL_COMMUNITY): Payer: Medicaid Other | Admitting: Oncology

## 2011-06-22 ENCOUNTER — Encounter (HOSPITAL_COMMUNITY): Payer: Self-pay | Admitting: Oncology

## 2011-06-22 ENCOUNTER — Encounter (HOSPITAL_COMMUNITY): Payer: Medicaid Other | Attending: Oncology

## 2011-06-22 ENCOUNTER — Other Ambulatory Visit (HOSPITAL_COMMUNITY): Payer: Self-pay | Admitting: Oncology

## 2011-06-22 VITALS — BP 123/81 | HR 73 | Temp 97.6°F | Wt 242.4 lb

## 2011-06-22 DIAGNOSIS — E7800 Pure hypercholesterolemia, unspecified: Secondary | ICD-10-CM

## 2011-06-22 DIAGNOSIS — I10 Essential (primary) hypertension: Secondary | ICD-10-CM | POA: Insufficient documentation

## 2011-06-22 DIAGNOSIS — Z170 Estrogen receptor positive status [ER+]: Secondary | ICD-10-CM

## 2011-06-22 DIAGNOSIS — E119 Type 2 diabetes mellitus without complications: Secondary | ICD-10-CM

## 2011-06-22 DIAGNOSIS — F329 Major depressive disorder, single episode, unspecified: Secondary | ICD-10-CM

## 2011-06-22 DIAGNOSIS — E785 Hyperlipidemia, unspecified: Secondary | ICD-10-CM | POA: Insufficient documentation

## 2011-06-22 DIAGNOSIS — E1165 Type 2 diabetes mellitus with hyperglycemia: Secondary | ICD-10-CM | POA: Insufficient documentation

## 2011-06-22 DIAGNOSIS — Z5111 Encounter for antineoplastic chemotherapy: Secondary | ICD-10-CM

## 2011-06-22 DIAGNOSIS — C50919 Malignant neoplasm of unspecified site of unspecified female breast: Secondary | ICD-10-CM

## 2011-06-22 DIAGNOSIS — E669 Obesity, unspecified: Secondary | ICD-10-CM

## 2011-06-22 DIAGNOSIS — K219 Gastro-esophageal reflux disease without esophagitis: Secondary | ICD-10-CM

## 2011-06-22 DIAGNOSIS — Z171 Estrogen receptor negative status [ER-]: Secondary | ICD-10-CM

## 2011-06-22 DIAGNOSIS — F32A Depression, unspecified: Secondary | ICD-10-CM

## 2011-06-22 HISTORY — DX: Depression, unspecified: F32.A

## 2011-06-22 HISTORY — DX: Gastro-esophageal reflux disease without esophagitis: K21.9

## 2011-06-22 HISTORY — DX: Pure hypercholesterolemia, unspecified: E78.00

## 2011-06-22 HISTORY — DX: Type 2 diabetes mellitus without complications: E11.9

## 2011-06-22 HISTORY — DX: Essential (primary) hypertension: I10

## 2011-06-22 LAB — CBC
HCT: 34.7 % — ABNORMAL LOW (ref 36.0–46.0)
Hemoglobin: 11 g/dL — ABNORMAL LOW (ref 12.0–15.0)
MCHC: 31.7 g/dL (ref 30.0–36.0)
Platelets: 321 10*3/uL (ref 150–400)
RDW: 19.1 % — ABNORMAL HIGH (ref 11.5–15.5)
WBC: 6.2 10*3/uL (ref 4.0–10.5)

## 2011-06-22 LAB — BASIC METABOLIC PANEL
BUN: 11 mg/dL (ref 6–23)
CO2: 22 mEq/L (ref 19–32)
Calcium: 9.1 mg/dL (ref 8.4–10.5)
Chloride: 101 mEq/L (ref 96–112)
Creatinine, Ser: 0.75 mg/dL (ref 0.50–1.10)
GFR calc Af Amer: >60 mL/min (ref >60)
Potassium: 3.6 mEq/L (ref 3.5–5.1)
Sodium: 139 mEq/L (ref 135–145)

## 2011-06-22 LAB — DIFFERENTIAL
Basophils Absolute: 0 10*3/uL (ref 0.0–0.1)
Basophils Relative: 0 % (ref 0–1)
Eosinophils Absolute: 0.1 10*3/uL (ref 0.0–0.7)
Eosinophils Relative: 1 % (ref 0–5)
Lymphocytes Relative: 26 % (ref 12–46)
Lymphs Abs: 1.6 10*3/uL (ref 0.7–4.0)
Monocytes Absolute: 0.6 10*3/uL (ref 0.1–1.0)
Monocytes Relative: 9 % (ref 3–12)

## 2011-06-22 LAB — MAGNESIUM: Magnesium: 2 mg/dL (ref 1.5–2.5)

## 2011-06-22 MED ORDER — DOXORUBICIN HCL CHEMO IV INJECTION 2 MG/ML
68.0000 mg | Freq: Once | INTRAVENOUS | Status: AC
  Administered 2011-06-22: 68 mg via INTRAVENOUS
  Filled 2011-06-22: qty 34

## 2011-06-22 MED ORDER — FOSAPREPITANT DIMEGLUMINE INJECTION 150 MG
Freq: Once | INTRAVENOUS | Status: AC
  Administered 2011-06-22: 12:00:00 via INTRAVENOUS
  Filled 2011-06-22: qty 5

## 2011-06-22 MED ORDER — PALONOSETRON HCL INJECTION 0.25 MG/5ML
0.2500 mg | Freq: Once | INTRAVENOUS | Status: AC
  Administered 2011-06-22: 0.25 mg via INTRAVENOUS
  Filled 2011-06-22: qty 5

## 2011-06-22 MED ORDER — SODIUM CHLORIDE 0.9 % IV SOLN
600.0000 mg/m2 | Freq: Once | INTRAVENOUS | Status: AC
  Administered 2011-06-22: 1360 mg via INTRAVENOUS
  Filled 2011-06-22: qty 68

## 2011-06-22 MED ORDER — HEPARIN SOD (PORK) LOCK FLUSH 100 UNIT/ML IV SOLN
INTRAVENOUS | Status: AC
  Filled 2011-06-22: qty 5

## 2011-06-22 MED ORDER — DEXAMETHASONE SODIUM PHOSPHATE 4 MG/ML IJ SOLN: 20.0000 mg | Freq: Once | INTRAMUSCULAR | Status: DC

## 2011-06-22 MED ORDER — LORAZEPAM 2 MG/ML IJ SOLN
1.0000 mg | Freq: Once | INTRAMUSCULAR | Status: AC
  Administered 2011-06-22: 1 mg via INTRAVENOUS

## 2011-06-22 MED ORDER — SODIUM CHLORIDE 0.9 % IV SOLN: 150.0000 mg | Freq: Once | INTRAVENOUS | Status: DC

## 2011-06-22 MED ORDER — LORAZEPAM 2 MG/ML IJ SOLN
INTRAMUSCULAR | Status: AC
  Filled 2011-06-22: qty 1

## 2011-06-22 MED ORDER — HEPARIN SOD (PORK) LOCK FLUSH 100 UNIT/ML IV SOLN
500.0000 [IU] | Freq: Once | INTRAVENOUS | Status: AC | PRN
  Administered 2011-06-22: 500 [IU]

## 2011-06-22 MED ORDER — DOXORUBICIN HCL CHEMO IV INJECTION 2 MG/ML: 60.0000 mg/m2 | Freq: Once | INTRAVENOUS | Status: DC

## 2011-06-22 MED ORDER — SODIUM CHLORIDE 0.9 % IV SOLN
Freq: Once | INTRAVENOUS | Status: AC
  Administered 2011-06-22: 12:00:00 via INTRAVENOUS

## 2011-06-22 MED ORDER — SODIUM CHLORIDE 0.9 % IJ SOLN
10.0000 mL | INTRAMUSCULAR | Status: DC | PRN
  Administered 2011-06-22: 10 mL

## 2011-06-22 NOTE — Progress Notes (Signed)
Jill Burrow, MD, MD [facility] [address], [address] [place] [address]  1. Breast cancer     CURRENT THERAPY: S/P cycle 2 of AC on 06/01/11  INTERVAL HISTORY: [name] 45 y.o. female returns for  regular  visit for followup of Stage II breast cancer 3.8 cm, grade 3, infiltrating ductal carcinoma with clear margins and no LVI.  She denies any complaints today.  She requests a refill of her Zofran which is controlling her nausea.  This will gladly be called in to her pharmacy.  She reports that she is excited to be nearing the end of her [facility] chemotherapy.     Past Medical History  Diagnosis Date  . Diabetes mellitus   . Hypertension   . Breast cancer 05/22/2011    03/01/11    has Breast cancer on her problem list.      has no known allergies.  [name] had no medications administered during this visit.  Past Surgical History  Procedure Date  . Back surg x2   . Cholecystectomy   . Mm breast stereo bx*l*r/s     rt.  . Mastectomy partial / lumpectomy     rt.  . Colonoscopy     Denies any headaches, dizziness, double vision, fevers, chills, night sweats, nausea, vomiting, diarrhea, constipation, chest pain, heart palpitations, shortness of breath, blood in stool, black tarry stool, urinary pain, urinary burning, urinary frequency, hematuria.   PHYSICAL EXAMINATION  ECOG PERFORMANCE STATUS: 2 - Symptomatic, <50% confined to bed  There were no vitals filed for this visit.  GENERAL:no distress, well nourished, well developed, comfortable, cooperative, moderate distress and smiling SKIN: skin color, texture, turgor are normal, no rashes or significant lesions HEAD: Normocephalic, No masses, lesions, tenderness or abnormalities EYES: normal, PERRLA, EOMI EARS: External ears normal OROPHARYNX:mucous membranes are moist  NECK: supple, trachea midline LYMPH:  no palpable lymphadenopathy BREAST:not examined LUNGS: clear to auscultation and  percussion HEART: regular rate & rhythm, no murmurs, no gallops, S1 normal and S2 normal ABDOMEN:abdomen soft, obese and normal bowel sounds BACK: Back symmetric, no curvature., No CVA tenderness EXTREMITIES:less then 2 second capillary refill, no joint deformities, effusion, or inflammation, no edema, no skin discoloration, no clubbing, no cyanosis  NEURO: alert & oriented x 3 with fluent speech, no focal motor/sensory deficits, gait normal    LABORATORY DATA: Lab Results  Component Value Date   WBC 2.3* 06/11/2011   HGB 10.7* 06/11/2011   HCT 33.8* 06/11/2011   MCV 83.0 06/11/2011   PLT 176 06/11/2011     Chemistry      Component Value Date/Time   NA 139 06/22/2011 0920   K 3.6 06/22/2011 0920   CL 101 06/22/2011 0920   CO2 22 06/22/2011 0920   BUN 11 06/22/2011 0920   CREATININE 0.75 06/22/2011 0920      Component Value Date/Time   CALCIUM 9.1 06/22/2011 0920   ALKPHOS 62 05/11/2011 0945   AST 14 05/11/2011 0945   ALT 12 05/11/2011 0945   BILITOT 0.3 05/11/2011 0945       PATHOLOGY: 1. Right Breast Lumpectomy- Invasive ductal carcinoma, 3.8 cm, grade III, no LVI identified, resection margins clear. ER 0%, PR 2%, Ki-67 90%, Her2 negative. 2. Right Sentinel Node biopsy- Negative for metastatic carcinoma    ASSESSMENT:  1. Infiltrating ductal carcinoma of right breast, S/P 2 cycles of AC 2. Nausea, vomiting secondary to chemotherapy. 3. Alopecia, secondary to chemotherapy 4. Depression, on Paxil 5. GERD,  on Nexium 6. DM, on metformin 6. Hypercholesterolemia, on Crestor and fish oil 7. HTN, on Toprol 8. Obesity   PLAN:  1. Call in an Rx for Zofran 2. Return in 3 weeks for follow-up. 3. Pre chemo lab work 4. 2D echo after this cycle of chemotherapy 5. Undergo cycle 3 of AC chemotherapy today.   All questions were answered. The patient knows to call the clinic with any problems, questions or concerns. We can certainly see the patient much sooner if necessary.  I spent 15  minutes counseling the patient face to face. The total time spent in the appointment was 30 minutes.  Lucius Wise

## 2011-06-25 ENCOUNTER — Ambulatory Visit (HOSPITAL_COMMUNITY)
Admission: RE | Admit: 2011-06-25 | Discharge: 2011-06-25 | Disposition: A | Discharge status: Home / Self Care | Payer: Medicaid Other | Source: Ambulatory Visit | Attending: Oncology | Admitting: Oncology

## 2011-06-25 ENCOUNTER — Telehealth (HOSPITAL_COMMUNITY): Payer: Self-pay

## 2011-06-25 DIAGNOSIS — Z09 Encounter for follow-up examination after completed treatment for conditions other than malignant neoplasm: Secondary | ICD-10-CM | POA: Insufficient documentation

## 2011-06-25 DIAGNOSIS — E7800 Pure hypercholesterolemia, unspecified: Secondary | ICD-10-CM | POA: Insufficient documentation

## 2011-06-25 DIAGNOSIS — E119 Type 2 diabetes mellitus without complications: Secondary | ICD-10-CM | POA: Insufficient documentation

## 2011-06-25 DIAGNOSIS — Z5111 Encounter for antineoplastic chemotherapy: Secondary | ICD-10-CM

## 2011-06-25 DIAGNOSIS — C50919 Malignant neoplasm of unspecified site of unspecified female breast: Secondary | ICD-10-CM | POA: Insufficient documentation

## 2011-06-25 DIAGNOSIS — I10 Essential (primary) hypertension: Secondary | ICD-10-CM | POA: Insufficient documentation

## 2011-06-25 NOTE — Telephone Encounter (Signed)
Pt did well after chemo according to husband. Stated she was "a little nauseated" today but was taking antiemetics as ordered

## 2011-06-25 NOTE — Progress Notes (Signed)
*  PRELIMINARY RESULTS* Echocardiogram 2D Echocardiogram has been performed.  [name] 06/25/2011, 1:40 PM

## 2011-06-27 ENCOUNTER — Telehealth (HOSPITAL_COMMUNITY): Payer: Self-pay | Admitting: *Deleted

## 2011-06-27 DIAGNOSIS — R110 Nausea: Secondary | ICD-10-CM

## 2011-06-27 MED ORDER — PROMETHAZINE HCL 25 MG PO TABS: 25.0000 mg | ORAL_TABLET | Freq: Four times a day (QID) | ORAL | Status: DC | PRN

## 2011-07-12 ENCOUNTER — Other Ambulatory Visit (HOSPITAL_COMMUNITY): Payer: Medicaid Other

## 2011-07-12 ENCOUNTER — Encounter (HOSPITAL_BASED_OUTPATIENT_CLINIC_OR_DEPARTMENT_OTHER): Payer: Medicaid Other

## 2011-07-12 DIAGNOSIS — C50919 Malignant neoplasm of unspecified site of unspecified female breast: Secondary | ICD-10-CM

## 2011-07-12 LAB — DIFFERENTIAL
Basophils Absolute: 0 10*3/uL (ref 0.0–0.1)
Basophils Relative: 1 % (ref 0–1)
Eosinophils Absolute: 0.1 10*3/uL (ref 0.0–0.7)
Eosinophils Relative: 2 % (ref 0–5)
Lymphocytes Relative: 30 % (ref 12–46)
Monocytes Absolute: 0.5 10*3/uL (ref 0.1–1.0)
Monocytes Relative: 14 % — ABNORMAL HIGH (ref 3–12)
Neutro Abs: 1.9 10*3/uL (ref 1.7–7.7)
Neutrophils Relative %: 53 % (ref 43–77)

## 2011-07-12 LAB — CBC
HCT: 33.5 % — ABNORMAL LOW (ref 36.0–46.0)
Hemoglobin: 10.6 g/dL — ABNORMAL LOW (ref 12.0–15.0)
MCH: 26.8 pg (ref 26.0–34.0)
MCHC: 31.6 g/dL (ref 30.0–36.0)
MCV: 84.8 fL (ref 78.0–100.0)
Platelets: 343 10*3/uL (ref 150–400)
RBC: 3.95 MIL/uL (ref 3.87–5.11)
RDW: 18.7 % — ABNORMAL HIGH (ref 11.5–15.5)
WBC: 3.6 10*3/uL — ABNORMAL LOW (ref 4.0–10.5)

## 2011-07-12 LAB — BASIC METABOLIC PANEL
BUN: 8 mg/dL (ref 6–23)
CO2: 30 mEq/L (ref 19–32)
Calcium: 9.2 mg/dL (ref 8.4–10.5)
Chloride: 101 mEq/L (ref 96–112)
Creatinine, Ser: 0.64 mg/dL (ref 0.50–1.10)
GFR calc Af Amer: >60 mL/min (ref >60)
Glucose, Bld: 104 mg/dL — ABNORMAL HIGH (ref 70–99)
Potassium: 3.3 mEq/L — ABNORMAL LOW (ref 3.5–5.1)

## 2011-07-12 LAB — MAGNESIUM: Magnesium: 2 mg/dL (ref 1.5–2.5)

## 2011-07-12 NOTE — Progress Notes (Signed)
[name] presented for labwork. Labs per MD order drawn via peripheral stick x 1 lt ac.  Procedure without incident.   Patient tolerated procedure well.

## 2011-07-13 ENCOUNTER — Encounter (HOSPITAL_BASED_OUTPATIENT_CLINIC_OR_DEPARTMENT_OTHER): Payer: Medicaid Other

## 2011-07-13 ENCOUNTER — Other Ambulatory Visit (HOSPITAL_COMMUNITY): Payer: Self-pay | Admitting: Oncology

## 2011-07-13 VITALS — BP 122/82 | HR 73 | Temp 98.0°F | Wt 236.8 lb

## 2011-07-13 DIAGNOSIS — Z5111 Encounter for antineoplastic chemotherapy: Secondary | ICD-10-CM

## 2011-07-13 DIAGNOSIS — C50119 Malignant neoplasm of central portion of unspecified female breast: Secondary | ICD-10-CM

## 2011-07-13 MED ORDER — SODIUM CHLORIDE 0.9 % IV SOLN: Freq: Once | INTRAVENOUS | Status: DC

## 2011-07-13 MED ORDER — LORAZEPAM 2 MG/ML IJ SOLN
1.0000 mg | Freq: Once | INTRAMUSCULAR | Status: AC
  Administered 2011-07-13: 1 mg via INTRAVENOUS

## 2011-07-13 MED ORDER — HEPARIN SOD (PORK) LOCK FLUSH 100 UNIT/ML IV SOLN: 500.0000 [IU] | Freq: Once | INTRAVENOUS | Status: DC | PRN

## 2011-07-13 MED ORDER — DOXORUBICIN HCL CHEMO IV INJECTION 2 MG/ML
68.0000 mg | Freq: Once | INTRAVENOUS | Status: AC
  Administered 2011-07-13: 68 mg via INTRAVENOUS
  Filled 2011-07-13: qty 34

## 2011-07-13 MED ORDER — PALONOSETRON HCL INJECTION 0.25 MG/5ML
0.2500 mg | Freq: Once | INTRAVENOUS | Status: DC
  Administered 2011-07-13: 0.25 mg via INTRAVENOUS
  Filled 2011-07-13: qty 5

## 2011-07-13 MED ORDER — SODIUM CHLORIDE 0.9 % IJ SOLN
INTRAMUSCULAR | Status: AC
  Filled 2011-07-13: qty 10

## 2011-07-13 MED ORDER — SODIUM CHLORIDE 0.9 % IJ SOLN: 10.0000 mL | INTRAMUSCULAR | Status: DC | PRN

## 2011-07-13 MED ORDER — DOXORUBICIN HCL CHEMO IV INJECTION 2 MG/ML: 60.0000 mg/m2 | Freq: Once | INTRAVENOUS | Status: DC

## 2011-07-13 MED ORDER — HEPARIN SOD (PORK) LOCK FLUSH 100 UNIT/ML IV SOLN
INTRAVENOUS | Status: AC
  Administered 2011-07-13: 500 [IU] via INTRAVENOUS
  Filled 2011-07-13: qty 5

## 2011-07-13 MED ORDER — DEXAMETHASONE SODIUM PHOSPHATE 4 MG/ML IJ SOLN: 20.0000 mg | Freq: Once | INTRAMUSCULAR | Status: DC

## 2011-07-13 MED ORDER — SODIUM CHLORIDE 0.9 % IV SOLN: 600.0000 mg/m2 | Freq: Once | INTRAVENOUS | Status: DC

## 2011-07-13 MED ORDER — FOSAPREPITANT DIMEGLUMINE INJECTION 150 MG
Freq: Once | INTRAVENOUS | Status: AC
  Administered 2011-07-13: 10:00:00 via INTRAVENOUS
  Filled 2011-07-13: qty 5

## 2011-07-13 MED ORDER — LORAZEPAM 2 MG/ML IJ SOLN
INTRAMUSCULAR | Status: AC
  Administered 2011-07-13: 1 mg via INTRAVENOUS
  Filled 2011-07-13: qty 1

## 2011-07-13 MED ORDER — SODIUM CHLORIDE 0.9 % IV SOLN
1360.0000 mg | Freq: Once | INTRAVENOUS | Status: AC
  Administered 2011-07-13: 1360 mg via INTRAVENOUS
  Filled 2011-07-13: qty 68

## 2011-07-13 MED ORDER — SODIUM CHLORIDE 0.9 % IV SOLN: 150.0000 mg | Freq: Once | INTRAVENOUS | Status: DC

## 2011-07-13 NOTE — Progress Notes (Signed)
Pt's encounter accidentally closed when chemo plan signed this am. Aloxi .25 mg /5 ml IV push given over 2-3 min at 1042 am

## 2011-07-13 NOTE — Progress Notes (Signed)
Patient instructed to increase potassium to 1 pill 2 x daily.24 Hour Chemotherapy Follow up Call  [name] called at home following administration of  chemotherapy. Patient reports nausea and weakness  Medications reviewed promethiazine and zofran.   Following interventions recommended:  Take zofran along with Promethiazine and continue to drink fluids.  Will call patient back in am to check on condition.  Reviewed all post chemotherapy instructions with patient and when should call clinic or MD.  Patient verbalized understanding.

## 2011-07-13 NOTE — Progress Notes (Signed)
Note: 2 encounters for today's visit due to accidental closing of initial encounter.  Pt in today for Cycle 4 of Adria and cytoxan. Tolerated infusion without problems. Returns in 21 days to begin weekly Taxol x 12.

## 2011-07-13 NOTE — Progress Notes (Signed)
[facility] Discharge Instructions for Patients Receiving Chemotherapy  Today you received the following chemotherapy agents adriamycin and cytoxan  To help prevent nausea and vomiting after your treatment, we encourage you to take your nausea medication promethazine. Begin taking it as needed and take it as often as prescribed for the next 48 hours.   If you develop nausea and vomiting that is not controlled by your nausea medication, call the clinic. If it is after clinic hours your family physician or the after hours number for the clinic or go to the Emergency Department.   BELOW ARE SYMPTOMS THAT SHOULD BE REPORTED IMMEDIATELY:  *FEVER GREATER THAN 101.0 F  *CHILLS WITH OR WITHOUT FEVER  NAUSEA AND VOMITING THAT IS NOT CONTROLLED WITH YOUR NAUSEA MEDICATION  *UNUSUAL SHORTNESS OF BREATH  *UNUSUAL BRUISING OR BLEEDING  TENDERNESS IN MOUTH AND THROAT WITH OR WITHOUT PRESENCE OF ULCERS  *URINARY PROBLEMS  *BOWEL PROBLEMS  UNUSUAL RASH Items with * indicate a potential emergency and should be followed up as soon as possible.  One of the nurses will contact you 24 hours after your treatment. Please let the nurse know about any problems that you may have experienced. Feel free to call the clinic you have any questions or concerns. The clinic phone number is [phone].   I have been informed and understand all the instructions given to me. I know to contact the clinic, my physician, or go to the Emergency Department if any problems should occur. I do not have any questions at this time, but understand that I may call the clinic during office hours or the Patient Navigator at [phone] should I have any questions or need assistance in obtaining follow up care.    __________________________________________  _____________  __________ Signature of Patient or Authorized Representative            Date                    Time    __________________________________________ Nurse's Signature

## 2011-07-13 NOTE — Progress Notes (Signed)
Addended by: Edythe Lynn A on: 07/13/2011 12:01 PM   Modules accepted: Orders

## 2011-07-13 NOTE — Progress Notes (Signed)
[facility] Discharge Instructions for Patients Receiving Chemotherapy  Today you received the following chemotherapy agents adriamycin and cytoxan  To help prevent nausea and vomiting after your treatment, we encourage you to take your nausea medication promethazine. Begin taking it as needed and take it as often as prescribed for the next 48 hours.   If you develop nausea and vomiting that is not controlled by your nausea medication, call the clinic. If it is after clinic hours your family physician or the after hours number for the clinic or go to the Emergency Department.   BELOW ARE SYMPTOMS THAT SHOULD BE REPORTED IMMEDIATELY:  *FEVER GREATER THAN 101.0 F  *CHILLS WITH OR WITHOUT FEVER  NAUSEA AND VOMITING THAT IS NOT CONTROLLED WITH YOUR NAUSEA MEDICATION  *UNUSUAL SHORTNESS OF BREATH  *UNUSUAL BRUISING OR BLEEDING  TENDERNESS IN MOUTH AND THROAT WITH OR WITHOUT PRESENCE OF ULCERS  *URINARY PROBLEMS  *BOWEL PROBLEMS  UNUSUAL RASH Items with * indicate a potential emergency and should be followed up as soon as possible.  One of the nurses will contact you 24 hours after your treatment. Please let the nurse know about any problems that you may have experienced. Feel free to call the clinic you have any questions or concerns. The clinic phone number is [phone].   I have been informed and understand all the instructions given to me. I know to contact the clinic, my physician, or go to the Emergency Department if any problems should occur. I do not have any questions at this time, but understand that I may call the clinic during office hours or the Patient Navigator at [phone] should I have any questions or need assistance in obtaining follow up care.    __________________________________________  _____________  __________ Signature of Patient or Authorized Representative            Date                    Time    __________________________________________ Nurse's Signature    

## 2011-07-20 ENCOUNTER — Encounter (HOSPITAL_COMMUNITY): Payer: Medicaid Other

## 2011-07-20 ENCOUNTER — Other Ambulatory Visit (HOSPITAL_COMMUNITY): Payer: Self-pay | Admitting: Oncology

## 2011-07-20 DIAGNOSIS — C50919 Malignant neoplasm of unspecified site of unspecified female breast: Secondary | ICD-10-CM

## 2011-07-20 LAB — BASIC METABOLIC PANEL
BUN: 8 mg/dL (ref 6–23)
CO2: 26 mEq/L (ref 19–32)
Calcium: 9.5 mg/dL (ref 8.4–10.5)
Creatinine, Ser: 0.66 mg/dL (ref 0.50–1.10)
GFR calc Af Amer: >60 mL/min (ref >60)
Glucose, Bld: 137 mg/dL — ABNORMAL HIGH (ref 70–99)
Potassium: 3.6 mEq/L (ref 3.5–5.1)
Sodium: 138 mEq/L (ref 135–145)

## 2011-08-03 ENCOUNTER — Other Ambulatory Visit (HOSPITAL_COMMUNITY): Payer: Self-pay | Admitting: Oncology

## 2011-08-03 ENCOUNTER — Encounter (HOSPITAL_COMMUNITY): Payer: Medicaid Other | Attending: Oncology

## 2011-08-03 VITALS — BP 144/85 | HR 70 | Temp 98.5°F | Wt 237.4 lb

## 2011-08-03 DIAGNOSIS — Z5111 Encounter for antineoplastic chemotherapy: Secondary | ICD-10-CM

## 2011-08-03 DIAGNOSIS — C50119 Malignant neoplasm of central portion of unspecified female breast: Secondary | ICD-10-CM

## 2011-08-03 DIAGNOSIS — C50919 Malignant neoplasm of unspecified site of unspecified female breast: Secondary | ICD-10-CM

## 2011-08-03 LAB — DIFFERENTIAL
Eosinophils Absolute: 0.1 10*3/uL (ref 0.0–0.7)
Eosinophils Relative: 1 % (ref 0–5)
Lymphocytes Relative: 22 % (ref 12–46)
Lymphs Abs: 0.9 10*3/uL (ref 0.7–4.0)
Monocytes Absolute: 0.5 10*3/uL (ref 0.1–1.0)
Monocytes Relative: 13 % — ABNORMAL HIGH (ref 3–12)
Neutro Abs: 2.5 10*3/uL (ref 1.7–7.7)

## 2011-08-03 LAB — BASIC METABOLIC PANEL
BUN: 8 mg/dL (ref 6–23)
CO2: 31 mEq/L (ref 19–32)
Calcium: 8.8 mg/dL (ref 8.4–10.5)
Chloride: 101 mEq/L (ref 96–112)
Creatinine, Ser: 0.76 mg/dL (ref 0.50–1.10)
GFR calc Af Amer: >60 mL/min (ref >60)
GFR calc non Af Amer: >60 mL/min (ref >60)
Glucose, Bld: 112 mg/dL — ABNORMAL HIGH (ref 70–99)
Potassium: 3.3 mEq/L — ABNORMAL LOW (ref 3.5–5.1)
Sodium: 141 mEq/L (ref 135–145)

## 2011-08-03 LAB — CBC
Hemoglobin: 10 g/dL — ABNORMAL LOW (ref 12.0–15.0)
MCH: 26.7 pg (ref 26.0–34.0)
MCHC: 31.2 g/dL (ref 30.0–36.0)
MCV: 85.6 fL (ref 78.0–100.0)
Platelets: 294 10*3/uL (ref 150–400)
RDW: 19.1 % — ABNORMAL HIGH (ref 11.5–15.5)
WBC: 4 10*3/uL (ref 4.0–10.5)

## 2011-08-03 MED ORDER — SODIUM CHLORIDE 0.9 % IV SOLN
Freq: Once | INTRAVENOUS | Status: AC
  Administered 2011-08-03: 09:00:00 via INTRAVENOUS

## 2011-08-03 MED ORDER — DIPHENHYDRAMINE HCL 50 MG/ML IJ SOLN
50.0000 mg | Freq: Once | INTRAMUSCULAR | Status: AC
Start: 2011-08-03 — End: 2011-08-03
  Administered 2011-08-03: 50 mg via INTRAVENOUS

## 2011-08-03 MED ORDER — ONDANSETRON 8 MG/50ML IVPB (CHCC): 8.0000 mg | Freq: Once | INTRAVENOUS | Status: DC

## 2011-08-03 MED ORDER — SODIUM CHLORIDE 0.9 % IJ SOLN
10.0000 mL | INTRAMUSCULAR | Status: DC | PRN
  Filled 2011-08-03: qty 10

## 2011-08-03 MED ORDER — DEXAMETHASONE 4 MG PO TABS: ORAL_TABLET | ORAL | Status: DC

## 2011-08-03 MED ORDER — HEPARIN SOD (PORK) LOCK FLUSH 100 UNIT/ML IV SOLN
500.0000 [IU] | Freq: Once | INTRAVENOUS | Status: AC | PRN
  Administered 2011-08-03: 500 [IU]
  Filled 2011-08-03: qty 5

## 2011-08-03 MED ORDER — PACLITAXEL CHEMO INJECTION 300 MG/50ML
80.0000 mg/m2 | Freq: Once | INTRAVENOUS | Status: AC
  Administered 2011-08-03: 180 mg via INTRAVENOUS
  Filled 2011-08-03: qty 30

## 2011-08-03 MED ORDER — FAMOTIDINE IN NACL 20-0.9 MG/50ML-% IV SOLN
20.0000 mg | Freq: Once | INTRAVENOUS | Status: AC
  Administered 2011-08-03: 20 mg via INTRAVENOUS
  Filled 2011-08-03: qty 50

## 2011-08-03 MED ORDER — HEPARIN SOD (PORK) LOCK FLUSH 100 UNIT/ML IV SOLN
INTRAVENOUS | Status: AC
  Filled 2011-08-03: qty 5

## 2011-08-03 MED ORDER — LOPERAMIDE HCL 2 MG PO CAPS
4.0000 mg | ORAL_CAPSULE | ORAL | Status: AC
  Administered 2011-08-03: 4 mg via ORAL
  Filled 2011-08-03: qty 2

## 2011-08-03 MED ORDER — DEXAMETHASONE SODIUM PHOSPHATE 4 MG/ML IJ SOLN: 20.0000 mg | Freq: Once | INTRAMUSCULAR | Status: DC

## 2011-08-03 MED ORDER — DIPHENHYDRAMINE HCL 50 MG/ML IJ SOLN
INTRAMUSCULAR | Status: AC
  Administered 2011-08-03: 50 mg via INTRAVENOUS
  Filled 2011-08-03: qty 1

## 2011-08-03 MED ORDER — SODIUM CHLORIDE 0.9 % IV SOLN
Freq: Once | INTRAVENOUS | Status: AC
  Administered 2011-08-03: 8 mg via INTRAVENOUS
  Filled 2011-08-03: qty 4

## 2011-08-03 NOTE — Progress Notes (Signed)
First dose Taxol per protocol at titrated dose starting at 63cc/hr x 15 mins, tolerated well. Increased to 125cc/hr x 15 mins, tolerated well. Increased to 188cc/hr x 15 mins, tolerated well. Increased to 250cc/hr until done. Apx 30 mins into rate of 250cc/hr pt c/o being hot and sweaty. Pt afebrile. Reported to T.Kefalas, PA pt's complaints. No change in infusion ordered. Continued to watch pt and symptoms did not increase or change.

## 2011-08-06 ENCOUNTER — Telehealth (HOSPITAL_COMMUNITY): Payer: Self-pay | Admitting: *Deleted

## 2011-08-06 NOTE — Telephone Encounter (Signed)
Message left with patient to call and let us know how she did after the 1st Taxol tx on Friday.

## 2011-08-10 ENCOUNTER — Encounter (HOSPITAL_BASED_OUTPATIENT_CLINIC_OR_DEPARTMENT_OTHER): Payer: Medicaid Other

## 2011-08-10 VITALS — BP 114/75 | HR 81 | Temp 97.7°F | Ht 64.0 in | Wt 237.0 lb

## 2011-08-10 DIAGNOSIS — Z5111 Encounter for antineoplastic chemotherapy: Secondary | ICD-10-CM

## 2011-08-10 DIAGNOSIS — C50919 Malignant neoplasm of unspecified site of unspecified female breast: Secondary | ICD-10-CM

## 2011-08-10 LAB — DIFFERENTIAL
Basophils Absolute: 0 10*3/uL (ref 0.0–0.1)
Basophils Relative: 1 % (ref 0–1)
Eosinophils Absolute: 0.1 10*3/uL (ref 0.0–0.7)
Lymphocytes Relative: 16 % (ref 12–46)
Lymphs Abs: 0.9 10*3/uL (ref 0.7–4.0)
Monocytes Relative: 4 % (ref 3–12)
Neutro Abs: 4.6 10*3/uL (ref 1.7–7.7)
Neutrophils Relative %: 78 % — ABNORMAL HIGH (ref 43–77)

## 2011-08-10 LAB — CBC
HCT: 30.5 % — ABNORMAL LOW (ref 36.0–46.0)
Hemoglobin: 9.6 g/dL — ABNORMAL LOW (ref 12.0–15.0)
MCH: 27.1 pg (ref 26.0–34.0)
MCHC: 31.5 g/dL (ref 30.0–36.0)
Platelets: 211 10*3/uL (ref 150–400)
RBC: 3.54 MIL/uL — ABNORMAL LOW (ref 3.87–5.11)
RDW: 19.1 % — ABNORMAL HIGH (ref 11.5–15.5)
WBC: 5.9 10*3/uL (ref 4.0–10.5)

## 2011-08-10 LAB — BASIC METABOLIC PANEL
CO2: 27 mEq/L (ref 19–32)
Calcium: 8.5 mg/dL (ref 8.4–10.5)
Creatinine, Ser: 0.73 mg/dL (ref 0.50–1.10)
GFR calc Af Amer: >60 mL/min (ref >60)
GFR calc non Af Amer: >60 mL/min (ref >60)
Glucose, Bld: 100 mg/dL — ABNORMAL HIGH (ref 70–99)
Potassium: 3.3 mEq/L — ABNORMAL LOW (ref 3.5–5.1)
Sodium: 136 mEq/L (ref 135–145)

## 2011-08-10 LAB — MAGNESIUM: Magnesium: 2 mg/dL (ref 1.5–2.5)

## 2011-08-10 MED ORDER — DIPHENHYDRAMINE HCL 50 MG/ML IJ SOLN
INTRAMUSCULAR | Status: AC
  Administered 2011-08-10: 50 mg via INTRAVENOUS
  Filled 2011-08-10: qty 1

## 2011-08-10 MED ORDER — DEXAMETHASONE SODIUM PHOSPHATE 4 MG/ML IJ SOLN: 20.0000 mg | Freq: Once | INTRAMUSCULAR | Status: DC

## 2011-08-10 MED ORDER — PACLITAXEL CHEMO INJECTION 300 MG/50ML
80.0000 mg/m2 | Freq: Once | INTRAVENOUS | Status: AC
  Administered 2011-08-10: 180 mg via INTRAVENOUS
  Filled 2011-08-10: qty 30

## 2011-08-10 MED ORDER — ONDANSETRON 8 MG/50ML IVPB (CHCC): 8.0000 mg | Freq: Once | INTRAVENOUS | Status: DC

## 2011-08-10 MED ORDER — SODIUM CHLORIDE 0.9 % IV SOLN
Freq: Once | INTRAVENOUS | Status: AC
  Administered 2011-08-10: 8 mg via INTRAVENOUS
  Filled 2011-08-10: qty 4

## 2011-08-10 MED ORDER — HEPARIN SOD (PORK) LOCK FLUSH 100 UNIT/ML IV SOLN
INTRAVENOUS | Status: AC
  Filled 2011-08-10: qty 5

## 2011-08-10 MED ORDER — SODIUM CHLORIDE 0.9 % IJ SOLN
INTRAMUSCULAR | Status: AC
  Filled 2011-08-10: qty 10

## 2011-08-10 MED ORDER — SODIUM CHLORIDE 0.9 % IV SOLN
Freq: Once | INTRAVENOUS | Status: AC
  Administered 2011-08-10: 10:00:00 via INTRAVENOUS

## 2011-08-10 MED ORDER — HEPARIN SOD (PORK) LOCK FLUSH 100 UNIT/ML IV SOLN
500.0000 [IU] | Freq: Once | INTRAVENOUS | Status: AC | PRN
  Administered 2011-08-10: 500 [IU]
  Filled 2011-08-10: qty 5

## 2011-08-10 MED ORDER — FAMOTIDINE IN NACL 20-0.9 MG/50ML-% IV SOLN
20.0000 mg | Freq: Once | INTRAVENOUS | Status: AC
Start: 2011-08-10 — End: 2011-08-10
  Administered 2011-08-10: 20 mg via INTRAVENOUS
  Filled 2011-08-10: qty 50

## 2011-08-10 MED ORDER — SODIUM CHLORIDE 0.9 % IJ SOLN
10.0000 mL | INTRAMUSCULAR | Status: DC | PRN
  Administered 2011-08-10: 10 mL
  Filled 2011-08-10: qty 10

## 2011-08-10 MED ORDER — DIPHENHYDRAMINE HCL 50 MG/ML IJ SOLN
50.0000 mg | Freq: Once | INTRAMUSCULAR | Status: AC
  Administered 2011-08-10: 50 mg via INTRAVENOUS

## 2011-08-10 NOTE — Progress Notes (Signed)
Tolerated taxol well. Home accompanied by spouse,

## 2011-08-13 ENCOUNTER — Telehealth (HOSPITAL_COMMUNITY): Payer: Self-pay

## 2011-08-13 NOTE — Telephone Encounter (Signed)
Message left to call us if needed.

## 2011-08-13 NOTE — Telephone Encounter (Signed)
Message left for patient to call clinic with Potassium dosage.

## 2011-08-16 ENCOUNTER — Encounter (HOSPITAL_BASED_OUTPATIENT_CLINIC_OR_DEPARTMENT_OTHER): Payer: Medicaid Other

## 2011-08-16 DIAGNOSIS — C50919 Malignant neoplasm of unspecified site of unspecified female breast: Secondary | ICD-10-CM

## 2011-08-16 LAB — DIFFERENTIAL
Eosinophils Absolute: 0.1 10*3/uL (ref 0.0–0.7)
Eosinophils Relative: 2 % (ref 0–5)
Lymphocytes Relative: 22 % (ref 12–46)
Lymphs Abs: 1.4 10*3/uL (ref 0.7–4.0)
Monocytes Absolute: 0.3 10*3/uL (ref 0.1–1.0)
Monocytes Relative: 5 % (ref 3–12)
Neutro Abs: 4.4 10*3/uL (ref 1.7–7.7)

## 2011-08-16 LAB — CBC
HCT: 32 % — ABNORMAL LOW (ref 36.0–46.0)
Hemoglobin: 10.1 g/dL — ABNORMAL LOW (ref 12.0–15.0)
MCH: 27.4 pg (ref 26.0–34.0)
MCHC: 31.6 g/dL (ref 30.0–36.0)
MCV: 87 fL (ref 78.0–100.0)
RBC: 3.68 MIL/uL — ABNORMAL LOW (ref 3.87–5.11)
WBC: 6.2 10*3/uL (ref 4.0–10.5)

## 2011-08-16 NOTE — Progress Notes (Signed)
[name] presented for labwork. Labs per MD order drawn via Peripheral Line 25 gauge needle inserted in lt arm   Procedure without incident.  Needle removed intact. Patient tolerated procedure well.

## 2011-08-17 ENCOUNTER — Telehealth (HOSPITAL_COMMUNITY): Payer: Self-pay | Admitting: *Deleted

## 2011-08-17 ENCOUNTER — Encounter (HOSPITAL_COMMUNITY): Payer: Self-pay | Admitting: Oncology

## 2011-08-17 ENCOUNTER — Other Ambulatory Visit (HOSPITAL_COMMUNITY): Payer: Self-pay | Admitting: Oncology

## 2011-08-17 ENCOUNTER — Encounter (HOSPITAL_BASED_OUTPATIENT_CLINIC_OR_DEPARTMENT_OTHER): Payer: Medicaid Other

## 2011-08-17 ENCOUNTER — Encounter (HOSPITAL_BASED_OUTPATIENT_CLINIC_OR_DEPARTMENT_OTHER): Payer: Medicaid Other | Admitting: Oncology

## 2011-08-17 VITALS — BP 121/74 | HR 89 | Temp 99.5°F | Wt 237.2 lb

## 2011-08-17 DIAGNOSIS — C50919 Malignant neoplasm of unspecified site of unspecified female breast: Secondary | ICD-10-CM

## 2011-08-17 DIAGNOSIS — Z5111 Encounter for antineoplastic chemotherapy: Secondary | ICD-10-CM

## 2011-08-17 DIAGNOSIS — N644 Mastodynia: Secondary | ICD-10-CM

## 2011-08-17 DIAGNOSIS — R110 Nausea: Secondary | ICD-10-CM

## 2011-08-17 DIAGNOSIS — M79606 Pain in leg, unspecified: Secondary | ICD-10-CM

## 2011-08-17 MED ORDER — PACLITAXEL CHEMO INJECTION 300 MG/50ML
80.0000 mg/m2 | Freq: Once | INTRAVENOUS | Status: AC
  Administered 2011-08-17: 180 mg via INTRAVENOUS
  Filled 2011-08-17: qty 30

## 2011-08-17 MED ORDER — ONDANSETRON 8 MG/50ML IVPB (CHCC): 8.0000 mg | Freq: Once | INTRAVENOUS | Status: DC

## 2011-08-17 MED ORDER — SODIUM CHLORIDE 0.9 % IV SOLN
Freq: Once | INTRAVENOUS | Status: AC
  Administered 2011-08-17: 12:00:00 via INTRAVENOUS

## 2011-08-17 MED ORDER — FAMOTIDINE IN NACL 20-0.9 MG/50ML-% IV SOLN
20.0000 mg | Freq: Once | INTRAVENOUS | Status: AC
  Administered 2011-08-17: 20 mg via INTRAVENOUS
  Filled 2011-08-17: qty 50

## 2011-08-17 MED ORDER — PROMETHAZINE HCL 25 MG PO TABS: 25.0000 mg | ORAL_TABLET | Freq: Four times a day (QID) | ORAL | Status: DC | PRN

## 2011-08-17 MED ORDER — SODIUM CHLORIDE 0.9 % IJ SOLN
INTRAMUSCULAR | Status: AC
  Filled 2011-08-17: qty 20

## 2011-08-17 MED ORDER — DEXAMETHASONE SODIUM PHOSPHATE 4 MG/ML IJ SOLN: 20.0000 mg | Freq: Once | INTRAMUSCULAR | Status: DC

## 2011-08-17 MED ORDER — SODIUM CHLORIDE 0.9 % IV SOLN
Freq: Once | INTRAVENOUS | Status: AC
  Administered 2011-08-17: 8 mg via INTRAVENOUS
  Filled 2011-08-17: qty 4

## 2011-08-17 MED ORDER — HYDROCODONE-ACETAMINOPHEN 5-325 MG PO TABS: 1.0000 | ORAL_TABLET | Freq: Four times a day (QID) | ORAL | Status: AC | PRN

## 2011-08-17 MED ORDER — DIPHENHYDRAMINE HCL 50 MG/ML IJ SOLN
INTRAMUSCULAR | Status: AC
  Filled 2011-08-17: qty 1

## 2011-08-17 MED ORDER — SODIUM CHLORIDE 0.9 % IJ SOLN
10.0000 mL | INTRAMUSCULAR | Status: DC | PRN
  Filled 2011-08-17: qty 10

## 2011-08-17 MED ORDER — DIPHENHYDRAMINE HCL 50 MG/ML IJ SOLN
50.0000 mg | Freq: Once | INTRAMUSCULAR | Status: AC
  Administered 2011-08-17: 50 mg via INTRAVENOUS

## 2011-08-17 MED ORDER — HEPARIN SOD (PORK) LOCK FLUSH 100 UNIT/ML IV SOLN
500.0000 [IU] | Freq: Once | INTRAVENOUS | Status: AC | PRN
  Administered 2011-08-17: 500 [IU]
  Filled 2011-08-17: qty 5

## 2011-08-17 MED ORDER — HEPARIN SOD (PORK) LOCK FLUSH 100 UNIT/ML IV SOLN
INTRAVENOUS | Status: AC
  Filled 2011-08-17: qty 5

## 2011-08-17 NOTE — Telephone Encounter (Signed)
Pt needs refills on phenergan and hydrocodone. She has rt. Breast pain and leg pain.

## 2011-08-17 NOTE — Progress Notes (Signed)
This office note has been dictated.

## 2011-08-17 NOTE — Progress Notes (Signed)
Tolerated well

## 2011-08-18 ENCOUNTER — Telehealth (HOSPITAL_COMMUNITY): Payer: Self-pay

## 2011-08-18 NOTE — Telephone Encounter (Signed)
Chemotherapy Call back:  Message left on patient's voicemail to call back if any problems experienced with chemotherapy.

## 2011-08-23 ENCOUNTER — Encounter (HOSPITAL_COMMUNITY): Payer: Medicaid Other | Attending: Oncology

## 2011-08-23 DIAGNOSIS — C50119 Malignant neoplasm of central portion of unspecified female breast: Secondary | ICD-10-CM

## 2011-08-23 DIAGNOSIS — C50919 Malignant neoplasm of unspecified site of unspecified female breast: Secondary | ICD-10-CM

## 2011-08-23 LAB — BASIC METABOLIC PANEL
BUN: 14 mg/dL (ref 6–23)
CO2: 28 mEq/L (ref 19–32)
Calcium: 8.8 mg/dL (ref 8.4–10.5)
Chloride: 98 mEq/L (ref 96–112)
Creatinine, Ser: 0.89 mg/dL (ref 0.50–1.10)
GFR calc Af Amer: 89 mL/min — ABNORMAL LOW (ref >90)
GFR calc non Af Amer: 77 mL/min — ABNORMAL LOW (ref >90)
Glucose, Bld: 121 mg/dL — ABNORMAL HIGH (ref 70–99)
Potassium: 3.3 mEq/L — ABNORMAL LOW (ref 3.5–5.1)
Sodium: 137 mEq/L (ref 135–145)

## 2011-08-23 LAB — CBC
Hemoglobin: 10.5 g/dL — ABNORMAL LOW (ref 12.0–15.0)
MCH: 28.3 pg (ref 26.0–34.0)
MCV: 87.3 fL (ref 78.0–100.0)
RBC: 3.71 MIL/uL — ABNORMAL LOW (ref 3.87–5.11)

## 2011-08-23 LAB — DIFFERENTIAL
Basophils Absolute: 0 10*3/uL (ref 0.0–0.1)
Eosinophils Absolute: 0.1 10*3/uL (ref 0.0–0.7)
Eosinophils Relative: 2 % (ref 0–5)
Lymphocytes Relative: 22 % (ref 12–46)
Lymphs Abs: 1.4 10*3/uL (ref 0.7–4.0)
Monocytes Relative: 5 % (ref 3–12)
Neutro Abs: 4.8 10*3/uL (ref 1.7–7.7)

## 2011-08-23 NOTE — Progress Notes (Signed)
[name] presented for labwork. Labs per MD order drawn via Peripheral Line 24 gauge needle inserted in RT forearm  Good blood return present. Procedure without incident.  Needle removed intact. Patient tolerated procedure well.

## 2011-08-24 ENCOUNTER — Inpatient Hospital Stay (HOSPITAL_COMMUNITY): Payer: Medicaid Other

## 2011-08-24 ENCOUNTER — Encounter (HOSPITAL_BASED_OUTPATIENT_CLINIC_OR_DEPARTMENT_OTHER): Payer: Medicaid Other

## 2011-08-24 ENCOUNTER — Encounter (HOSPITAL_BASED_OUTPATIENT_CLINIC_OR_DEPARTMENT_OTHER): Payer: Medicaid Other | Admitting: Oncology

## 2011-08-24 VITALS — BP 100/68 | HR 93 | Temp 98.5°F | Wt 240.0 lb

## 2011-08-24 DIAGNOSIS — C50119 Malignant neoplasm of central portion of unspecified female breast: Secondary | ICD-10-CM

## 2011-08-24 DIAGNOSIS — G8918 Other acute postprocedural pain: Secondary | ICD-10-CM

## 2011-08-24 DIAGNOSIS — C50919 Malignant neoplasm of unspecified site of unspecified female breast: Secondary | ICD-10-CM

## 2011-08-24 DIAGNOSIS — Z5111 Encounter for antineoplastic chemotherapy: Secondary | ICD-10-CM

## 2011-08-24 LAB — HCG, SERUM, QUALITATIVE: Preg, Serum: NEGATIVE

## 2011-08-24 MED ORDER — SODIUM CHLORIDE 0.9 % IV SOLN
Freq: Once | INTRAVENOUS | Status: AC
  Administered 2011-08-24: 12:00:00 via INTRAVENOUS

## 2011-08-24 MED ORDER — ONDANSETRON 8 MG/50ML IVPB (CHCC): 8.0000 mg | Freq: Once | INTRAVENOUS | Status: DC

## 2011-08-24 MED ORDER — PACLITAXEL CHEMO INJECTION 300 MG/50ML
80.0000 mg/m2 | Freq: Once | INTRAVENOUS | Status: AC
  Administered 2011-08-24: 180 mg via INTRAVENOUS
  Filled 2011-08-24: qty 30

## 2011-08-24 MED ORDER — SODIUM CHLORIDE 0.9 % IJ SOLN
10.0000 mL | INTRAMUSCULAR | Status: DC | PRN
  Administered 2011-08-24: 10 mL
  Filled 2011-08-24: qty 10

## 2011-08-24 MED ORDER — DIPHENHYDRAMINE HCL 50 MG/ML IJ SOLN
INTRAMUSCULAR | Status: AC
  Administered 2011-08-24: 50 mg via INTRAVENOUS
  Filled 2011-08-24: qty 1

## 2011-08-24 MED ORDER — DEXAMETHASONE SODIUM PHOSPHATE 4 MG/ML IJ SOLN: 20.0000 mg | Freq: Once | INTRAMUSCULAR | Status: DC

## 2011-08-24 MED ORDER — SODIUM CHLORIDE 0.9 % IJ SOLN
INTRAMUSCULAR | Status: AC
  Administered 2011-08-24: 10 mL
  Filled 2011-08-24: qty 10

## 2011-08-24 MED ORDER — DIPHENHYDRAMINE HCL 50 MG/ML IJ SOLN
50.0000 mg | Freq: Once | INTRAMUSCULAR | Status: AC
  Administered 2011-08-24: 50 mg via INTRAVENOUS

## 2011-08-24 MED ORDER — FAMOTIDINE IN NACL 20-0.9 MG/50ML-% IV SOLN
20.0000 mg | Freq: Once | INTRAVENOUS | Status: AC
  Administered 2011-08-24: 20 mg via INTRAVENOUS
  Filled 2011-08-24: qty 50

## 2011-08-24 MED ORDER — HEPARIN SOD (PORK) LOCK FLUSH 100 UNIT/ML IV SOLN
500.0000 [IU] | Freq: Once | INTRAVENOUS | Status: AC | PRN
  Administered 2011-08-24: 500 [IU]
  Filled 2011-08-24: qty 5

## 2011-08-24 MED ORDER — SODIUM CHLORIDE 0.9 % IJ SOLN
INTRAMUSCULAR | Status: AC
  Filled 2011-08-24: qty 10

## 2011-08-24 MED ORDER — HEPARIN SOD (PORK) LOCK FLUSH 100 UNIT/ML IV SOLN
INTRAVENOUS | Status: AC
  Filled 2011-08-24: qty 5

## 2011-08-24 MED ORDER — SODIUM CHLORIDE 0.9 % IV SOLN
Freq: Once | INTRAVENOUS | Status: AC
  Administered 2011-08-24: 8 mg via INTRAVENOUS
  Filled 2011-08-24: qty 4

## 2011-08-24 NOTE — Progress Notes (Signed)
This office note has been dictated.

## 2011-08-30 ENCOUNTER — Encounter (HOSPITAL_BASED_OUTPATIENT_CLINIC_OR_DEPARTMENT_OTHER): Payer: Medicaid Other

## 2011-08-30 DIAGNOSIS — C50119 Malignant neoplasm of central portion of unspecified female breast: Secondary | ICD-10-CM

## 2011-08-30 DIAGNOSIS — C50919 Malignant neoplasm of unspecified site of unspecified female breast: Secondary | ICD-10-CM

## 2011-08-30 LAB — DIFFERENTIAL
Lymphs Abs: 1.3 10*3/uL (ref 0.7–4.0)
Monocytes Absolute: 0.3 10*3/uL (ref 0.1–1.0)
Monocytes Relative: 5 % (ref 3–12)
Neutro Abs: 3.6 10*3/uL (ref 1.7–7.7)
Neutrophils Relative %: 68 % (ref 43–77)

## 2011-08-30 LAB — CBC
HCT: 29.4 % — ABNORMAL LOW (ref 36.0–46.0)
Hemoglobin: 9.4 g/dL — ABNORMAL LOW (ref 12.0–15.0)
Platelets: 210 10*3/uL (ref 150–400)
RBC: 3.35 MIL/uL — ABNORMAL LOW (ref 3.87–5.11)
RDW: 19.9 % — ABNORMAL HIGH (ref 11.5–15.5)
WBC: 5.3 10*3/uL (ref 4.0–10.5)

## 2011-08-30 MED ORDER — SODIUM CHLORIDE 0.9 % IJ SOLN
INTRAMUSCULAR | Status: AC
  Filled 2011-08-30: qty 10

## 2011-08-30 MED ORDER — HEPARIN SOD (PORK) LOCK FLUSH 100 UNIT/ML IV SOLN
INTRAVENOUS | Status: AC
  Filled 2011-08-30: qty 5

## 2011-08-30 NOTE — Progress Notes (Signed)
[name] presented for Portacath access and flush. Proper placement of portacath confirmed by CXR. Portacath located lt chest wall accessed with  H 20 needle. Good blood return present. Portacath flushed with 20ml NS and 500U/31ml Heparin and needle removed intact. Procedure without incident. Patient tolerated procedure well.

## 2011-08-31 ENCOUNTER — Encounter (HOSPITAL_BASED_OUTPATIENT_CLINIC_OR_DEPARTMENT_OTHER): Payer: Medicaid Other

## 2011-08-31 ENCOUNTER — Other Ambulatory Visit (HOSPITAL_COMMUNITY): Payer: Self-pay | Admitting: Oncology

## 2011-08-31 ENCOUNTER — Inpatient Hospital Stay (HOSPITAL_COMMUNITY): Payer: Medicaid Other

## 2011-08-31 VITALS — BP 128/83 | HR 100 | Temp 98.6°F | Ht 64.0 in | Wt 243.0 lb

## 2011-08-31 DIAGNOSIS — K219 Gastro-esophageal reflux disease without esophagitis: Secondary | ICD-10-CM

## 2011-08-31 DIAGNOSIS — C50919 Malignant neoplasm of unspecified site of unspecified female breast: Secondary | ICD-10-CM

## 2011-08-31 DIAGNOSIS — Z5111 Encounter for antineoplastic chemotherapy: Secondary | ICD-10-CM

## 2011-08-31 DIAGNOSIS — C50119 Malignant neoplasm of central portion of unspecified female breast: Secondary | ICD-10-CM

## 2011-08-31 MED ORDER — FAMOTIDINE IN NACL 20-0.9 MG/50ML-% IV SOLN
20.0000 mg | Freq: Once | INTRAVENOUS | Status: AC
  Administered 2011-08-31: 20 mg via INTRAVENOUS
  Filled 2011-08-31: qty 50

## 2011-08-31 MED ORDER — SODIUM CHLORIDE 0.9 % IV SOLN
Freq: Once | INTRAVENOUS | Status: AC
  Administered 2011-08-31: 8 mg via INTRAVENOUS
  Filled 2011-08-31: qty 4

## 2011-08-31 MED ORDER — ONDANSETRON 8 MG/50ML IVPB (CHCC): 8.0000 mg | Freq: Once | INTRAVENOUS | Status: DC

## 2011-08-31 MED ORDER — HEPARIN SOD (PORK) LOCK FLUSH 100 UNIT/ML IV SOLN
INTRAVENOUS | Status: AC
  Administered 2011-08-31: 500 [IU]
  Filled 2011-08-31: qty 5

## 2011-08-31 MED ORDER — DEXAMETHASONE SODIUM PHOSPHATE 4 MG/ML IJ SOLN: 20.0000 mg | Freq: Once | INTRAMUSCULAR | Status: DC

## 2011-08-31 MED ORDER — ESOMEPRAZOLE MAGNESIUM 40 MG PO CPDR: 40.0000 mg | DELAYED_RELEASE_CAPSULE | Freq: Every day | ORAL | Status: DC

## 2011-08-31 MED ORDER — HEPARIN SOD (PORK) LOCK FLUSH 100 UNIT/ML IV SOLN
500.0000 [IU] | Freq: Once | INTRAVENOUS | Status: AC | PRN
  Administered 2011-08-31: 500 [IU]
  Filled 2011-08-31: qty 5

## 2011-08-31 MED ORDER — PACLITAXEL CHEMO INJECTION 300 MG/50ML
80.0000 mg/m2 | Freq: Once | INTRAVENOUS | Status: AC
  Administered 2011-08-31: 180 mg via INTRAVENOUS
  Filled 2011-08-31: qty 30

## 2011-08-31 MED ORDER — SODIUM CHLORIDE 0.9 % IJ SOLN
10.0000 mL | INTRAMUSCULAR | Status: DC | PRN
  Administered 2011-08-31: 10 mL
  Filled 2011-08-31: qty 10

## 2011-08-31 MED ORDER — SODIUM CHLORIDE 0.9 % IV SOLN
Freq: Once | INTRAVENOUS | Status: AC
  Administered 2011-08-31: 500 mL via INTRAVENOUS

## 2011-08-31 MED ORDER — SODIUM CHLORIDE 0.9 % IJ SOLN
INTRAMUSCULAR | Status: AC
  Filled 2011-08-31: qty 10

## 2011-08-31 MED ORDER — DIPHENHYDRAMINE HCL 50 MG/ML IJ SOLN
INTRAMUSCULAR | Status: AC
  Administered 2011-08-31: 50 mg via INTRAVENOUS
  Filled 2011-08-31: qty 1

## 2011-08-31 MED ORDER — DIPHENHYDRAMINE HCL 50 MG/ML IJ SOLN
50.0000 mg | Freq: Once | INTRAMUSCULAR | Status: AC
  Administered 2011-08-31: 50 mg via INTRAVENOUS

## 2011-08-31 NOTE — Progress Notes (Signed)
CC:   [name], M.D. Billie Lade, Ph.D., M.D. [facility]  DIAGNOSIS:  Stage II carcinoma of the breast on the right, grade 3, 3.8 cm in size.  Clear margins were obtained at time of surgery.  No LVI was seen.  Sentinel node was negative.  ER receptor was 0, PR receptor was 2% with moderate staining only.  Ki-67 marker high at 90%.  HER2 was not amplified.  She underwent CT scans of the chest, abdomen and pelvis and a bone scan all of which showed no evidence of metastatic disease.  Her studies were done on 04/23/2011.  Her bone scan was done on 04/30/2011. That was also negative.  She is now undergoing chemotherapy with Adriamycin and Cytoxan which she has completed and is in her third week of paclitaxel.  After completion of chemotherapy will move on to consideration of radiation since she has already seen Dr. Roselind Messier who has recommended the radiation at the completion of chemotherapy.  She is doing well today.  She is doing better with the paclitaxel she states than the [facility].  Of course she has lost her hair but is starting to have a little fuzz there.  Her bowels are working quite well she states. Her potassium was a little bit low the other day but she states that she has potassium at home but she does not take it on a regular basis, but I have asked her to resume that on a daily basis.  Her white count, platelets and hemoglobin have held up very nicely.  PHYSICAL EXAMINATION:  Vital signs today, she is afebrile.  Blood pressure 122/84, left arm sitting position.  Weight still high at 237 pounds on a 5 feet 4 inch frame.  Pulse right around 100 and regular. Respirations 16-18 and unlabored.  Her lungs are clear.  She has no adenopathy in the cervical, supraclavicular, infraclavicular areas.  She has a heart which shows regular rhythm and rate.  Port is intact in the left upper chest wall.  Abdomen is obese, nontender, without organomegaly or masses.  She has no  edema of the legs or arms.  Will continue therapy.  We will see her back in about 3-4 weeks.    ______________________________ [name], MD ESN/MEDQ  D:  08/17/2011  T:  08/17/2011  Job:  [id number]

## 2011-08-31 NOTE — Progress Notes (Signed)
Tolerated chemo well. 

## 2011-09-03 ENCOUNTER — Other Ambulatory Visit (HOSPITAL_COMMUNITY): Payer: Self-pay | Admitting: Oncology

## 2011-09-03 DIAGNOSIS — R52 Pain, unspecified: Secondary | ICD-10-CM

## 2011-09-03 MED ORDER — HYDROCODONE-ACETAMINOPHEN 5-325 MG PO TABS: 1.0000 | ORAL_TABLET | Freq: Four times a day (QID) | ORAL | Status: DC | PRN

## 2011-09-04 NOTE — Telephone Encounter (Signed)
Just trying to close the encounter. 

## 2011-09-06 ENCOUNTER — Encounter (HOSPITAL_BASED_OUTPATIENT_CLINIC_OR_DEPARTMENT_OTHER): Payer: Medicaid Other

## 2011-09-06 DIAGNOSIS — C50119 Malignant neoplasm of central portion of unspecified female breast: Secondary | ICD-10-CM

## 2011-09-06 DIAGNOSIS — C50919 Malignant neoplasm of unspecified site of unspecified female breast: Secondary | ICD-10-CM

## 2011-09-06 LAB — DIFFERENTIAL
Basophils Absolute: 0 10*3/uL (ref 0.0–0.1)
Eosinophils Relative: 1 % (ref 0–5)
Lymphocytes Relative: 24 % (ref 12–46)
Lymphs Abs: 1.3 10*3/uL (ref 0.7–4.0)
Monocytes Absolute: 0.4 10*3/uL (ref 0.1–1.0)
Monocytes Relative: 7 % (ref 3–12)
Neutro Abs: 3.7 10*3/uL (ref 1.7–7.7)
Neutrophils Relative %: 68 % (ref 43–77)

## 2011-09-06 LAB — CBC
HCT: 31.9 % — ABNORMAL LOW (ref 36.0–46.0)
Hemoglobin: 10.4 g/dL — ABNORMAL LOW (ref 12.0–15.0)
MCH: 29.1 pg (ref 26.0–34.0)
Platelets: 267 10*3/uL (ref 150–400)
RBC: 3.57 MIL/uL — ABNORMAL LOW (ref 3.87–5.11)
RDW: 19.8 % — ABNORMAL HIGH (ref 11.5–15.5)
WBC: 5.5 10*3/uL (ref 4.0–10.5)

## 2011-09-06 MED ORDER — HEPARIN SOD (PORK) LOCK FLUSH 100 UNIT/ML IV SOLN
500.0000 [IU] | Freq: Once | INTRAVENOUS | Status: AC
  Administered 2011-09-06: 500 [IU] via INTRAVENOUS
  Filled 2011-09-06: qty 5

## 2011-09-06 MED ORDER — HEPARIN SOD (PORK) LOCK FLUSH 100 UNIT/ML IV SOLN
INTRAVENOUS | Status: AC
  Filled 2011-09-06: qty 5

## 2011-09-06 MED ORDER — SODIUM CHLORIDE 0.9 % IJ SOLN
10.0000 mL | Freq: Once | INTRAMUSCULAR | Status: AC
  Administered 2011-09-06: 10 mL via INTRAVENOUS
  Filled 2011-09-06: qty 10

## 2011-09-06 MED ORDER — SODIUM CHLORIDE 0.9 % IJ SOLN
INTRAMUSCULAR | Status: AC
  Filled 2011-09-06: qty 20

## 2011-09-06 NOTE — Progress Notes (Signed)
[name] presented for Portacath access and flush. Proper placement of portacath confirmed by CXR. Portacath located left chest wall accessed with  H 20 needle. Good blood return present. Cbc diff drawn. Portacath flushed with 20ml NS and 500U/56ml Heparin and needle removed intact. Procedure without incident. Patient tolerated procedure well.

## 2011-09-07 ENCOUNTER — Encounter (HOSPITAL_BASED_OUTPATIENT_CLINIC_OR_DEPARTMENT_OTHER): Payer: Medicaid Other

## 2011-09-07 VITALS — BP 133/89 | HR 103 | Temp 98.5°F | Wt 243.2 lb

## 2011-09-07 DIAGNOSIS — C50119 Malignant neoplasm of central portion of unspecified female breast: Secondary | ICD-10-CM

## 2011-09-07 DIAGNOSIS — C50919 Malignant neoplasm of unspecified site of unspecified female breast: Secondary | ICD-10-CM

## 2011-09-07 DIAGNOSIS — Z5111 Encounter for antineoplastic chemotherapy: Secondary | ICD-10-CM

## 2011-09-07 MED ORDER — PACLITAXEL CHEMO INJECTION 300 MG/50ML
80.0000 mg/m2 | Freq: Once | INTRAVENOUS | Status: AC
  Administered 2011-09-07: 180 mg via INTRAVENOUS
  Filled 2011-09-07: qty 30

## 2011-09-07 MED ORDER — HEPARIN SOD (PORK) LOCK FLUSH 100 UNIT/ML IV SOLN
INTRAVENOUS | Status: AC
  Administered 2011-09-07: 500 [IU]
  Filled 2011-09-07: qty 5

## 2011-09-07 MED ORDER — DIPHENHYDRAMINE HCL 50 MG/ML IJ SOLN
50.0000 mg | Freq: Once | INTRAMUSCULAR | Status: AC
  Administered 2011-09-07: 50 mg via INTRAVENOUS

## 2011-09-07 MED ORDER — SODIUM CHLORIDE 0.9 % IV SOLN
Freq: Once | INTRAVENOUS | Status: AC
  Administered 2011-09-07: 8 mg via INTRAVENOUS
  Filled 2011-09-07: qty 4

## 2011-09-07 MED ORDER — ONDANSETRON 8 MG/50ML IVPB (CHCC): 8.0000 mg | Freq: Once | INTRAVENOUS | Status: DC

## 2011-09-07 MED ORDER — HEPARIN SOD (PORK) LOCK FLUSH 100 UNIT/ML IV SOLN
500.0000 [IU] | Freq: Once | INTRAVENOUS | Status: AC | PRN
  Administered 2011-09-07: 500 [IU]
  Filled 2011-09-07: qty 5

## 2011-09-07 MED ORDER — SODIUM CHLORIDE 0.9 % IV SOLN
Freq: Once | INTRAVENOUS | Status: AC
  Administered 2011-09-07: 12:00:00 via INTRAVENOUS

## 2011-09-07 MED ORDER — DEXAMETHASONE SODIUM PHOSPHATE 4 MG/ML IJ SOLN: 20.0000 mg | Freq: Once | INTRAMUSCULAR | Status: DC

## 2011-09-07 MED ORDER — FAMOTIDINE IN NACL 20-0.9 MG/50ML-% IV SOLN
20.0000 mg | Freq: Once | INTRAVENOUS | Status: AC
  Administered 2011-09-07: 20 mg via INTRAVENOUS
  Filled 2011-09-07: qty 50

## 2011-09-07 MED ORDER — DIPHENHYDRAMINE HCL 50 MG/ML IJ SOLN
INTRAMUSCULAR | Status: AC
  Filled 2011-09-07: qty 1

## 2011-09-07 NOTE — Progress Notes (Signed)
Tolerated Chemo well. 

## 2011-09-10 ENCOUNTER — Telehealth (HOSPITAL_COMMUNITY): Payer: Self-pay | Admitting: Oncology

## 2011-09-13 ENCOUNTER — Encounter (HOSPITAL_BASED_OUTPATIENT_CLINIC_OR_DEPARTMENT_OTHER): Payer: Medicaid Other

## 2011-09-13 DIAGNOSIS — C50919 Malignant neoplasm of unspecified site of unspecified female breast: Secondary | ICD-10-CM

## 2011-09-13 DIAGNOSIS — C50119 Malignant neoplasm of central portion of unspecified female breast: Secondary | ICD-10-CM

## 2011-09-13 LAB — CBC
Hemoglobin: 10.2 g/dL — ABNORMAL LOW (ref 12.0–15.0)
MCH: 29.5 pg (ref 26.0–34.0)
MCHC: 32.7 g/dL (ref 30.0–36.0)
MCV: 90.2 fL (ref 78.0–100.0)
Platelets: 248 10*3/uL (ref 150–400)
RBC: 3.46 MIL/uL — ABNORMAL LOW (ref 3.87–5.11)
RDW: 19.5 % — ABNORMAL HIGH (ref 11.5–15.5)
WBC: 5.8 10*3/uL (ref 4.0–10.5)

## 2011-09-13 LAB — DIFFERENTIAL
Basophils Absolute: 0 10*3/uL (ref 0.0–0.1)
Basophils Relative: 1 % (ref 0–1)
Eosinophils Absolute: 0.1 10*3/uL (ref 0.0–0.7)
Lymphocytes Relative: 26 % (ref 12–46)
Lymphs Abs: 1.5 10*3/uL (ref 0.7–4.0)
Monocytes Absolute: 0.3 10*3/uL (ref 0.1–1.0)
Monocytes Relative: 5 % (ref 3–12)
Neutrophils Relative %: 68 % (ref 43–77)

## 2011-09-13 NOTE — Progress Notes (Signed)
[name] presented for labwork. Labs per MD order drawn via peripheral stick rt ac. Lab for cbc diff. Procedure without incident.  Patient tolerated procedure well.

## 2011-09-14 ENCOUNTER — Encounter (HOSPITAL_BASED_OUTPATIENT_CLINIC_OR_DEPARTMENT_OTHER): Payer: Medicaid Other

## 2011-09-14 VITALS — BP 112/72 | HR 98 | Temp 98.1°F | Wt 247.6 lb

## 2011-09-14 DIAGNOSIS — Z5111 Encounter for antineoplastic chemotherapy: Secondary | ICD-10-CM

## 2011-09-14 DIAGNOSIS — C50119 Malignant neoplasm of central portion of unspecified female breast: Secondary | ICD-10-CM

## 2011-09-14 DIAGNOSIS — C50919 Malignant neoplasm of unspecified site of unspecified female breast: Secondary | ICD-10-CM

## 2011-09-14 MED ORDER — SODIUM CHLORIDE 0.9 % IV SOLN
Freq: Once | INTRAVENOUS | Status: AC
  Administered 2011-09-14: 8 mg via INTRAVENOUS
  Filled 2011-09-14: qty 4

## 2011-09-14 MED ORDER — SODIUM CHLORIDE 0.9 % IJ SOLN
10.0000 mL | INTRAMUSCULAR | Status: DC | PRN
  Administered 2011-09-14: 10 mL
  Filled 2011-09-14: qty 10

## 2011-09-14 MED ORDER — DEXAMETHASONE SODIUM PHOSPHATE 4 MG/ML IJ SOLN: 20.0000 mg | Freq: Once | INTRAMUSCULAR | Status: DC

## 2011-09-14 MED ORDER — DIPHENHYDRAMINE HCL 50 MG/ML IJ SOLN
INTRAMUSCULAR | Status: AC
  Administered 2011-09-14: 50 mg via INTRAVENOUS
  Filled 2011-09-14: qty 1

## 2011-09-14 MED ORDER — SODIUM CHLORIDE 0.9 % IV SOLN
Freq: Once | INTRAVENOUS | Status: AC
  Administered 2011-09-14: 12:00:00 via INTRAVENOUS

## 2011-09-14 MED ORDER — DIPHENHYDRAMINE HCL 50 MG/ML IJ SOLN
50.0000 mg | Freq: Once | INTRAMUSCULAR | Status: AC
  Administered 2011-09-14: 50 mg via INTRAVENOUS

## 2011-09-14 MED ORDER — FAMOTIDINE IN NACL 20-0.9 MG/50ML-% IV SOLN
20.0000 mg | Freq: Once | INTRAVENOUS | Status: AC
  Administered 2011-09-14: 20 mg via INTRAVENOUS
  Filled 2011-09-14: qty 50

## 2011-09-14 MED ORDER — HEPARIN SOD (PORK) LOCK FLUSH 100 UNIT/ML IV SOLN
500.0000 [IU] | Freq: Once | INTRAVENOUS | Status: AC | PRN
  Administered 2011-09-14: 500 [IU]
  Filled 2011-09-14: qty 5

## 2011-09-14 MED ORDER — PACLITAXEL CHEMO INJECTION 300 MG/50ML
80.0000 mg/m2 | Freq: Once | INTRAVENOUS | Status: AC
  Administered 2011-09-14: 180 mg via INTRAVENOUS
  Filled 2011-09-14: qty 30

## 2011-09-14 MED ORDER — ONDANSETRON 8 MG/50ML IVPB (CHCC): 8.0000 mg | Freq: Once | INTRAVENOUS | Status: DC

## 2011-09-14 NOTE — Progress Notes (Signed)
[facility] Discharge Instructions for Patients Receiving Chemotherapy  Today you received the following chemotherapy agents taxol  To help prevent nausea and vomiting after your treatment, we encourage you to take your nausea medication Decadron as prescribed and your other anti-nausea medication as needed  If you develop nausea and vomiting that is not controlled by your nausea medication, call the clinic. If it is after clinic hours your family physician or the after hours number for the clinic or go to the Emergency Department.   BELOW ARE SYMPTOMS THAT SHOULD BE REPORTED IMMEDIATELY:  *FEVER GREATER THAN 101.0 F  *CHILLS WITH OR WITHOUT FEVER  NAUSEA AND VOMITING THAT IS NOT CONTROLLED WITH YOUR NAUSEA MEDICATION  *UNUSUAL SHORTNESS OF BREATH  *UNUSUAL BRUISING OR BLEEDING  TENDERNESS IN MOUTH AND THROAT WITH OR WITHOUT PRESENCE OF ULCERS  *URINARY PROBLEMS  *BOWEL PROBLEMS  UNUSUAL RASH Items with * indicate a potential emergency and should be followed up as soon as possible.  One of the nurses will contact you 24 hours after your treatment. Please let the nurse know about any problems that you may have experienced. Feel free to call the clinic you have any questions or concerns. The clinic phone number is [phone].   I have been informed and understand all the instructions given to me. I know to contact the clinic, my physician, or go to the Emergency Department if any problems should occur. I do not have any questions at this time, but understand that I may call the clinic during office hours or the Patient Navigator at [phone] should I have any questions or need assistance in obtaining follow up care.    __________________________________________  _____________  __________ Signature of Patient or Authorized Representative            Date                   Time    __________________________________________ Nurse's Signature   Tolerated  Chemotherapy without any complaints.

## 2011-09-15 ENCOUNTER — Telehealth (HOSPITAL_COMMUNITY): Payer: Self-pay

## 2011-09-15 NOTE — Telephone Encounter (Signed)
Post Chemotherapy Follow-up: Unable to reach patient and message left on her voicemail to call back if she was having any problems.

## 2011-09-20 ENCOUNTER — Encounter (HOSPITAL_COMMUNITY): Payer: Medicaid Other | Attending: Oncology

## 2011-09-20 DIAGNOSIS — C50119 Malignant neoplasm of central portion of unspecified female breast: Secondary | ICD-10-CM

## 2011-09-20 DIAGNOSIS — R05 Cough: Secondary | ICD-10-CM | POA: Insufficient documentation

## 2011-09-20 DIAGNOSIS — Z452 Encounter for adjustment and management of vascular access device: Secondary | ICD-10-CM

## 2011-09-20 DIAGNOSIS — C50919 Malignant neoplasm of unspecified site of unspecified female breast: Secondary | ICD-10-CM

## 2011-09-20 DIAGNOSIS — J029 Acute pharyngitis, unspecified: Secondary | ICD-10-CM | POA: Insufficient documentation

## 2011-09-20 DIAGNOSIS — R059 Cough, unspecified: Secondary | ICD-10-CM | POA: Insufficient documentation

## 2011-09-20 LAB — COMPREHENSIVE METABOLIC PANEL
ALT: 18 U/L (ref 0–35)
AST: 14 U/L (ref 0–37)
Albumin: 3.6 g/dL (ref 3.5–5.2)
Alkaline Phosphatase: 86 U/L (ref 39–117)
BUN: 15 mg/dL (ref 6–23)
CO2: 27 mEq/L (ref 19–32)
Calcium: 9.3 mg/dL (ref 8.4–10.5)
Chloride: 98 mEq/L (ref 96–112)
Creatinine, Ser: 0.75 mg/dL (ref 0.50–1.10)
GFR calc Af Amer: >90 mL/min (ref >90)
GFR calc non Af Amer: >90 mL/min (ref >90)
Glucose, Bld: 90 mg/dL (ref 70–99)
Potassium: 3 mEq/L — ABNORMAL LOW (ref 3.5–5.1)
Sodium: 139 mEq/L (ref 135–145)
Total Bilirubin: 0.2 mg/dL — ABNORMAL LOW (ref 0.3–1.2)
Total Protein: 7 g/dL (ref 6.0–8.3)

## 2011-09-20 LAB — DIFFERENTIAL
Basophils Relative: 1 % (ref 0–1)
Eosinophils Absolute: 0.1 10*3/uL (ref 0.0–0.7)
Eosinophils Relative: 1 % (ref 0–5)
Lymphocytes Relative: 26 % (ref 12–46)
Lymphs Abs: 1.5 10*3/uL (ref 0.7–4.0)
Monocytes Absolute: 0.3 10*3/uL (ref 0.1–1.0)
Neutrophils Relative %: 66 % (ref 43–77)

## 2011-09-20 LAB — CBC
Hemoglobin: 10.3 g/dL — ABNORMAL LOW (ref 12.0–15.0)
MCH: 28.9 pg (ref 26.0–34.0)
MCHC: 31.6 g/dL (ref 30.0–36.0)
MCV: 91.6 fL (ref 78.0–100.0)
Platelets: 268 10*3/uL (ref 150–400)
RBC: 3.56 MIL/uL — ABNORMAL LOW (ref 3.87–5.11)

## 2011-09-20 MED ORDER — SODIUM CHLORIDE 0.9 % IJ SOLN
10.0000 mL | Freq: Once | INTRAMUSCULAR | Status: AC
  Administered 2011-09-20: 10 mL via INTRAVENOUS
  Filled 2011-09-20: qty 10

## 2011-09-20 MED ORDER — HEPARIN SOD (PORK) LOCK FLUSH 100 UNIT/ML IV SOLN
500.0000 [IU] | Freq: Once | INTRAVENOUS | Status: AC
  Administered 2011-09-20: 500 [IU] via INTRAVENOUS
  Filled 2011-09-20: qty 5

## 2011-09-20 MED ORDER — HEPARIN SOD (PORK) LOCK FLUSH 100 UNIT/ML IV SOLN
INTRAVENOUS | Status: AC
  Filled 2011-09-20: qty 5

## 2011-09-20 NOTE — Progress Notes (Signed)
[name] presented for Portacath access and flush. Proper placement of portacath confirmed by CXR. Portacath located LT chest wall accessed with  H 20 needle. Good blood return present. Portacath flushed with 20ml NS and 500U/68ml Heparin and needle removed intact. Procedure without incident. Patient tolerated procedure well.

## 2011-09-21 ENCOUNTER — Other Ambulatory Visit (HOSPITAL_COMMUNITY): Payer: Self-pay | Admitting: Oncology

## 2011-09-21 ENCOUNTER — Encounter (HOSPITAL_BASED_OUTPATIENT_CLINIC_OR_DEPARTMENT_OTHER): Payer: Medicaid Other

## 2011-09-21 VITALS — BP 135/89 | HR 107 | Temp 98.5°F | Ht 64.0 in | Wt 247.2 lb

## 2011-09-21 DIAGNOSIS — Z5111 Encounter for antineoplastic chemotherapy: Secondary | ICD-10-CM

## 2011-09-21 DIAGNOSIS — E876 Hypokalemia: Secondary | ICD-10-CM

## 2011-09-21 DIAGNOSIS — C50919 Malignant neoplasm of unspecified site of unspecified female breast: Secondary | ICD-10-CM

## 2011-09-21 DIAGNOSIS — C50119 Malignant neoplasm of central portion of unspecified female breast: Secondary | ICD-10-CM

## 2011-09-21 MED ORDER — HEPARIN SOD (PORK) LOCK FLUSH 100 UNIT/ML IV SOLN
500.0000 [IU] | Freq: Once | INTRAVENOUS | Status: AC | PRN
  Administered 2011-09-21: 500 [IU]
  Filled 2011-09-21: qty 5

## 2011-09-21 MED ORDER — DIPHENHYDRAMINE HCL 50 MG/ML IJ SOLN
50.0000 mg | Freq: Once | INTRAMUSCULAR | Status: AC
  Administered 2011-09-21: 50 mg via INTRAVENOUS

## 2011-09-21 MED ORDER — SODIUM CHLORIDE 0.9 % IV SOLN
Freq: Once | INTRAVENOUS | Status: AC
  Administered 2011-09-21: 8 mg via INTRAVENOUS
  Filled 2011-09-21: qty 4

## 2011-09-21 MED ORDER — DEXAMETHASONE SODIUM PHOSPHATE 4 MG/ML IJ SOLN: 20.0000 mg | Freq: Once | INTRAMUSCULAR | Status: DC

## 2011-09-21 MED ORDER — HEPARIN SOD (PORK) LOCK FLUSH 100 UNIT/ML IV SOLN
INTRAVENOUS | Status: AC
  Filled 2011-09-21: qty 5

## 2011-09-21 MED ORDER — PACLITAXEL CHEMO INJECTION 300 MG/50ML
80.0000 mg/m2 | Freq: Once | INTRAVENOUS | Status: AC
  Administered 2011-09-21: 180 mg via INTRAVENOUS
  Filled 2011-09-21: qty 30

## 2011-09-21 MED ORDER — SODIUM CHLORIDE 0.9 % IV SOLN
Freq: Once | INTRAVENOUS | Status: AC
  Administered 2011-09-21: 12:00:00 via INTRAVENOUS

## 2011-09-21 MED ORDER — POTASSIUM CHLORIDE 2 MEQ/ML FOR ORAL USE: ORAL | Status: DC

## 2011-09-21 MED ORDER — FAMOTIDINE IN NACL 20-0.9 MG/50ML-% IV SOLN
20.0000 mg | Freq: Once | INTRAVENOUS | Status: AC
  Administered 2011-09-21: 20 mg via INTRAVENOUS
  Filled 2011-09-21: qty 50

## 2011-09-21 MED ORDER — ONDANSETRON 8 MG/50ML IVPB (CHCC): 8.0000 mg | Freq: Once | INTRAVENOUS | Status: DC

## 2011-09-21 MED ORDER — DIPHENHYDRAMINE HCL 50 MG/ML IJ SOLN
INTRAMUSCULAR | Status: AC
  Administered 2011-09-21: 50 mg via INTRAVENOUS
  Filled 2011-09-21: qty 1

## 2011-09-21 MED ORDER — SODIUM CHLORIDE 0.9 % IJ SOLN
10.0000 mL | INTRAMUSCULAR | Status: DC | PRN
  Administered 2011-09-21: 10 mL
  Filled 2011-09-21: qty 10

## 2011-09-26 NOTE — Progress Notes (Signed)
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  At the time of her pathological review, she had a 3.8 cm cancer, poorly differentiated, but no LVI.  One lymph node, namely sentinel node, was negative for metastatic disease, and her surgery took place on 03/21/2011 by Dr. [name].  She is here for therapy.  She has now completed her Adriamycin and Cytoxan.  She is now on Taxol.  She is doing well with her Taxol but wanted to be checked today because she is getting occasional sharp, shooting, hard pains at times in the right breast.  It is not associated with shortness of breath, she states, it is just intermittent, and it hits her hard and leaves very quickly.  PHYSICAL EXAMINATION:  She is in no acute distress.  She has no nodes in the infraclavicular or axillary area, and the breast itself shows the scar to be well healed, and there are no masses whatsoever in the right breast.  She is not tender presently.  I believe she is having postsurgical neurological pains from just the surgical excision and nothing more.  I tried to reassure her of this, reinforced the fact that this may occur periodically throughout her life.  We will see her as scheduled in the future.  We are going to continue therapy.    ______________________________ [name], MD ESN/MEDQ  D:  08/24/2011  T:  08/24/2011  Job:  [id number]

## 2011-09-27 ENCOUNTER — Encounter (HOSPITAL_COMMUNITY): Payer: Medicaid Other

## 2011-09-27 DIAGNOSIS — C50919 Malignant neoplasm of unspecified site of unspecified female breast: Secondary | ICD-10-CM

## 2011-09-27 LAB — DIFFERENTIAL
Basophils Absolute: 0 10*3/uL (ref 0.0–0.1)
Basophils Relative: 1 % (ref 0–1)
Eosinophils Absolute: 0.1 10*3/uL (ref 0.0–0.7)
Eosinophils Relative: 1 % (ref 0–5)
Lymphocytes Relative: 23 % (ref 12–46)
Lymphs Abs: 1.4 10*3/uL (ref 0.7–4.0)
Monocytes Absolute: 0.4 10*3/uL (ref 0.1–1.0)
Neutrophils Relative %: 70 % (ref 43–77)

## 2011-09-27 LAB — CBC
Hemoglobin: 11.2 g/dL — ABNORMAL LOW (ref 12.0–15.0)
MCH: 29.4 pg (ref 26.0–34.0)
MCV: 90 fL (ref 78.0–100.0)
Platelets: 260 10*3/uL (ref 150–400)
RBC: 3.81 MIL/uL — ABNORMAL LOW (ref 3.87–5.11)
RDW: 20.2 % — ABNORMAL HIGH (ref 11.5–15.5)
WBC: 6.3 10*3/uL (ref 4.0–10.5)

## 2011-09-27 MED ORDER — HEPARIN SOD (PORK) LOCK FLUSH 100 UNIT/ML IV SOLN
INTRAVENOUS | Status: AC
  Filled 2011-09-27: qty 5

## 2011-09-27 MED ORDER — SODIUM CHLORIDE 0.9 % IJ SOLN
INTRAMUSCULAR | Status: AC
  Filled 2011-09-27: qty 10

## 2011-09-27 MED ORDER — HEPARIN SOD (PORK) LOCK FLUSH 100 UNIT/ML IV SOLN
500.0000 [IU] | Freq: Once | INTRAVENOUS | Status: AC
  Administered 2011-09-27: 500 [IU] via INTRAVENOUS
  Filled 2011-09-27: qty 5

## 2011-09-27 MED ORDER — SODIUM CHLORIDE 0.9 % IJ SOLN
10.0000 mL | INTRAMUSCULAR | Status: DC | PRN
  Administered 2011-09-27: 10 mL via INTRAVENOUS
  Filled 2011-09-27: qty 10

## 2011-09-27 NOTE — Progress Notes (Signed)
[name] presented for Portacath access and flush. Proper placement of portacath confirmed by CXR. Portacath located left chest wall accessed with  H 20 needle. Good blood return present. Lab for CBC Diff drawn. Portacath flushed with 20ml NS and 500U/54ml Heparin. Port needle left accessed for chemo tomorrow. Procedure without incident. Patient tolerated procedure well.

## 2011-09-28 ENCOUNTER — Encounter (HOSPITAL_BASED_OUTPATIENT_CLINIC_OR_DEPARTMENT_OTHER): Payer: Medicaid Other

## 2011-09-28 VITALS — BP 128/83 | HR 116

## 2011-09-28 DIAGNOSIS — C50119 Malignant neoplasm of central portion of unspecified female breast: Secondary | ICD-10-CM

## 2011-09-28 DIAGNOSIS — Z5111 Encounter for antineoplastic chemotherapy: Secondary | ICD-10-CM

## 2011-09-28 DIAGNOSIS — C50919 Malignant neoplasm of unspecified site of unspecified female breast: Secondary | ICD-10-CM

## 2011-09-28 MED ORDER — FAMOTIDINE IN NACL 20-0.9 MG/50ML-% IV SOLN
20.0000 mg | Freq: Once | INTRAVENOUS | Status: AC
  Administered 2011-09-28: 20 mg via INTRAVENOUS
  Filled 2011-09-28: qty 50

## 2011-09-28 MED ORDER — DIPHENHYDRAMINE HCL 50 MG/ML IJ SOLN
50.0000 mg | Freq: Once | INTRAMUSCULAR | Status: AC
  Administered 2011-09-28: 50 mg via INTRAVENOUS

## 2011-09-28 MED ORDER — HEPARIN SOD (PORK) LOCK FLUSH 100 UNIT/ML IV SOLN
INTRAVENOUS | Status: AC
  Administered 2011-09-28: 500 [IU]
  Filled 2011-09-28: qty 5

## 2011-09-28 MED ORDER — DEXAMETHASONE SODIUM PHOSPHATE 4 MG/ML IJ SOLN: 20.0000 mg | Freq: Once | INTRAMUSCULAR | Status: DC

## 2011-09-28 MED ORDER — PACLITAXEL CHEMO INJECTION 300 MG/50ML
80.0000 mg/m2 | Freq: Once | INTRAVENOUS | Status: AC
  Administered 2011-09-28: 180 mg via INTRAVENOUS
  Filled 2011-09-28: qty 30

## 2011-09-28 MED ORDER — ONDANSETRON 8 MG/50ML IVPB (CHCC): 8.0000 mg | Freq: Once | INTRAVENOUS | Status: DC

## 2011-09-28 MED ORDER — SODIUM CHLORIDE 0.9 % IV SOLN
Freq: Once | INTRAVENOUS | Status: AC
  Administered 2011-09-28: 8 mg via INTRAVENOUS
  Filled 2011-09-28: qty 4

## 2011-09-28 MED ORDER — DIPHENHYDRAMINE HCL 50 MG/ML IJ SOLN
INTRAMUSCULAR | Status: AC
  Filled 2011-09-28: qty 1

## 2011-09-28 MED ORDER — SODIUM CHLORIDE 0.9 % IJ SOLN
INTRAMUSCULAR | Status: AC
  Filled 2011-09-28: qty 10

## 2011-09-28 MED ORDER — SODIUM CHLORIDE 0.9 % IV SOLN
Freq: Once | INTRAVENOUS | Status: AC
  Administered 2011-09-28: 12:00:00 via INTRAVENOUS

## 2011-09-28 MED ORDER — SODIUM CHLORIDE 0.9 % IJ SOLN
10.0000 mL | INTRAMUSCULAR | Status: DC | PRN
  Administered 2011-09-28: 10 mL
  Filled 2011-09-28: qty 10

## 2011-09-28 MED ORDER — HEPARIN SOD (PORK) LOCK FLUSH 100 UNIT/ML IV SOLN
500.0000 [IU] | Freq: Once | INTRAVENOUS | Status: AC | PRN
  Administered 2011-09-28: 500 [IU]
  Filled 2011-09-28: qty 5

## 2011-09-28 NOTE — Progress Notes (Signed)
Tolerated chemo well. 

## 2011-10-04 ENCOUNTER — Encounter (HOSPITAL_BASED_OUTPATIENT_CLINIC_OR_DEPARTMENT_OTHER): Payer: Medicaid Other

## 2011-10-04 DIAGNOSIS — C50919 Malignant neoplasm of unspecified site of unspecified female breast: Secondary | ICD-10-CM

## 2011-10-04 DIAGNOSIS — C50119 Malignant neoplasm of central portion of unspecified female breast: Secondary | ICD-10-CM

## 2011-10-04 LAB — BASIC METABOLIC PANEL
BUN: 12 mg/dL (ref 6–23)
CO2: 26 mEq/L (ref 19–32)
Calcium: 9.3 mg/dL (ref 8.4–10.5)
Chloride: 102 mEq/L (ref 96–112)
Creatinine, Ser: 0.75 mg/dL (ref 0.50–1.10)
GFR calc Af Amer: >90 mL/min (ref >90)
Glucose, Bld: 92 mg/dL (ref 70–99)
Sodium: 139 mEq/L (ref 135–145)

## 2011-10-04 LAB — CBC
HCT: 31.8 % — ABNORMAL LOW (ref 36.0–46.0)
Hemoglobin: 10.2 g/dL — ABNORMAL LOW (ref 12.0–15.0)
MCH: 29.7 pg (ref 26.0–34.0)
MCHC: 32.1 g/dL (ref 30.0–36.0)
Platelets: 250 10*3/uL (ref 150–400)
RDW: 20.7 % — ABNORMAL HIGH (ref 11.5–15.5)

## 2011-10-04 LAB — DIFFERENTIAL
Basophils Absolute: 0 10*3/uL (ref 0.0–0.1)
Basophils Relative: 0 % (ref 0–1)
Eosinophils Relative: 1 % (ref 0–5)
Lymphocytes Relative: 28 % (ref 12–46)
Monocytes Absolute: 0.4 10*3/uL (ref 0.1–1.0)
Neutro Abs: 4.1 10*3/uL (ref 1.7–7.7)

## 2011-10-04 MED ORDER — HEPARIN SOD (PORK) LOCK FLUSH 100 UNIT/ML IV SOLN
INTRAVENOUS | Status: AC
  Administered 2011-10-04: 500 [IU]
  Filled 2011-10-04: qty 5

## 2011-10-04 MED ORDER — SODIUM CHLORIDE 0.9 % IJ SOLN
INTRAMUSCULAR | Status: AC
  Administered 2011-10-04: 10 mL
  Filled 2011-10-04: qty 10

## 2011-10-04 NOTE — Progress Notes (Signed)
[name] presented for Portacath access and flush. Proper placement of portacath confirmed by CXR. Portacath located lt chest wall accessed with  H 20 needle. Good blood return present. Portacath flushed with 20ml NS and 500U/5ml Heparin and needle removed intact. Procedure without incident. Patient tolerated procedure well.   

## 2011-10-05 ENCOUNTER — Encounter (HOSPITAL_BASED_OUTPATIENT_CLINIC_OR_DEPARTMENT_OTHER): Payer: Medicaid Other

## 2011-10-05 VITALS — BP 124/88 | HR 102 | Temp 98.4°F | Wt 249.4 lb

## 2011-10-05 DIAGNOSIS — Z5111 Encounter for antineoplastic chemotherapy: Secondary | ICD-10-CM

## 2011-10-05 DIAGNOSIS — C50119 Malignant neoplasm of central portion of unspecified female breast: Secondary | ICD-10-CM

## 2011-10-05 DIAGNOSIS — C50919 Malignant neoplasm of unspecified site of unspecified female breast: Secondary | ICD-10-CM

## 2011-10-05 MED ORDER — SODIUM CHLORIDE 0.9 % IV SOLN: 8.0000 mg | Freq: Once | INTRAVENOUS | Status: DC

## 2011-10-05 MED ORDER — SODIUM CHLORIDE 0.9 % IJ SOLN
10.0000 mL | INTRAMUSCULAR | Status: DC | PRN
  Administered 2011-10-05: 10 mL
  Filled 2011-10-05: qty 10

## 2011-10-05 MED ORDER — PACLITAXEL CHEMO INJECTION 300 MG/50ML
80.0000 mg/m2 | Freq: Once | INTRAVENOUS | Status: AC
  Administered 2011-10-05: 180 mg via INTRAVENOUS
  Filled 2011-10-05: qty 30

## 2011-10-05 MED ORDER — SODIUM CHLORIDE 0.9 % IV SOLN
Freq: Once | INTRAVENOUS | Status: AC
  Administered 2011-10-05: 8 mg via INTRAVENOUS
  Filled 2011-10-05: qty 4

## 2011-10-05 MED ORDER — DEXAMETHASONE SODIUM PHOSPHATE 4 MG/ML IJ SOLN: 20.0000 mg | Freq: Once | INTRAMUSCULAR | Status: DC

## 2011-10-05 MED ORDER — HEPARIN SOD (PORK) LOCK FLUSH 100 UNIT/ML IV SOLN
INTRAVENOUS | Status: AC
  Administered 2011-10-05: 500 [IU]
  Filled 2011-10-05: qty 5

## 2011-10-05 MED ORDER — DIPHENHYDRAMINE HCL 50 MG/ML IJ SOLN
50.0000 mg | Freq: Once | INTRAMUSCULAR | Status: AC
  Administered 2011-10-05: 50 mg via INTRAVENOUS

## 2011-10-05 MED ORDER — FAMOTIDINE IN NACL 20-0.9 MG/50ML-% IV SOLN
20.0000 mg | Freq: Once | INTRAVENOUS | Status: AC
  Administered 2011-10-05: 20 mg via INTRAVENOUS
  Filled 2011-10-05: qty 50

## 2011-10-05 MED ORDER — DIPHENHYDRAMINE HCL 50 MG/ML IJ SOLN
INTRAMUSCULAR | Status: AC
  Administered 2011-10-05: 50 mg via INTRAVENOUS
  Filled 2011-10-05: qty 1

## 2011-10-05 MED ORDER — SODIUM CHLORIDE 0.9 % IJ SOLN
INTRAMUSCULAR | Status: AC
  Administered 2011-10-05: 10 mL
  Filled 2011-10-05: qty 10

## 2011-10-05 MED ORDER — SODIUM CHLORIDE 0.9 % IV SOLN
Freq: Once | INTRAVENOUS | Status: AC
  Administered 2011-10-05: 12:00:00 via INTRAVENOUS

## 2011-10-05 MED ORDER — HEPARIN SOD (PORK) LOCK FLUSH 100 UNIT/ML IV SOLN
500.0000 [IU] | Freq: Once | INTRAVENOUS | Status: AC | PRN
  Administered 2011-10-05: 500 [IU]
  Filled 2011-10-05: qty 5

## 2011-10-08 ENCOUNTER — Telehealth (HOSPITAL_COMMUNITY): Payer: Self-pay | Admitting: *Deleted

## 2011-10-08 NOTE — Telephone Encounter (Signed)
Call made to patient to check on pt but the phone number on file does not work at the current time.

## 2011-10-10 ENCOUNTER — Encounter (HOSPITAL_BASED_OUTPATIENT_CLINIC_OR_DEPARTMENT_OTHER): Payer: Medicaid Other

## 2011-10-10 VITALS — BP 135/85 | HR 101 | Temp 98.8°F | Ht 64.0 in | Wt 249.8 lb

## 2011-10-10 DIAGNOSIS — Z5111 Encounter for antineoplastic chemotherapy: Secondary | ICD-10-CM

## 2011-10-10 DIAGNOSIS — C50919 Malignant neoplasm of unspecified site of unspecified female breast: Secondary | ICD-10-CM

## 2011-10-10 DIAGNOSIS — C50119 Malignant neoplasm of central portion of unspecified female breast: Secondary | ICD-10-CM

## 2011-10-10 LAB — DIFFERENTIAL
Basophils Absolute: 0 10*3/uL (ref 0.0–0.1)
Basophils Relative: 0 % (ref 0–1)
Eosinophils Absolute: 0 10*3/uL (ref 0.0–0.7)
Eosinophils Relative: 1 % (ref 0–5)
Lymphocytes Relative: 24 % (ref 12–46)
Lymphs Abs: 1.5 10*3/uL (ref 0.7–4.0)
Neutro Abs: 4.5 10*3/uL (ref 1.7–7.7)
Neutrophils Relative %: 70 % (ref 43–77)

## 2011-10-10 LAB — CBC
HCT: 33 % — ABNORMAL LOW (ref 36.0–46.0)
MCH: 29.7 pg (ref 26.0–34.0)
MCV: 92.4 fL (ref 78.0–100.0)
Platelets: 256 10*3/uL (ref 150–400)
RBC: 3.57 MIL/uL — ABNORMAL LOW (ref 3.87–5.11)
RDW: 20.2 % — ABNORMAL HIGH (ref 11.5–15.5)
WBC: 6.4 10*3/uL (ref 4.0–10.5)

## 2011-10-10 MED ORDER — DEXAMETHASONE SODIUM PHOSPHATE 4 MG/ML IJ SOLN: 20.0000 mg | Freq: Once | INTRAMUSCULAR | Status: DC

## 2011-10-10 MED ORDER — ONDANSETRON HCL 4 MG/2ML IJ SOLN
Freq: Once | INTRAMUSCULAR | Status: AC
  Administered 2011-10-10: 8 mg via INTRAVENOUS
  Filled 2011-10-10: qty 4

## 2011-10-10 MED ORDER — DIPHENHYDRAMINE HCL 50 MG/ML IJ SOLN
50.0000 mg | Freq: Once | INTRAMUSCULAR | Status: AC
  Administered 2011-10-10: 50 mg via INTRAVENOUS

## 2011-10-10 MED ORDER — DIPHENHYDRAMINE HCL 50 MG/ML IJ SOLN
INTRAMUSCULAR | Status: AC
  Filled 2011-10-10: qty 1

## 2011-10-10 MED ORDER — HEPARIN SOD (PORK) LOCK FLUSH 100 UNIT/ML IV SOLN
500.0000 [IU] | Freq: Once | INTRAVENOUS | Status: AC | PRN
  Administered 2011-10-10: 500 [IU]
  Filled 2011-10-10: qty 5

## 2011-10-10 MED ORDER — SODIUM CHLORIDE 0.9 % IV SOLN
Freq: Once | INTRAVENOUS | Status: AC
  Administered 2011-10-10: 14:00:00 via INTRAVENOUS

## 2011-10-10 MED ORDER — HEPARIN SOD (PORK) LOCK FLUSH 100 UNIT/ML IV SOLN
INTRAVENOUS | Status: AC
  Filled 2011-10-10: qty 5

## 2011-10-10 MED ORDER — SODIUM CHLORIDE 0.9 % IJ SOLN
INTRAMUSCULAR | Status: AC
  Filled 2011-10-10: qty 10

## 2011-10-10 MED ORDER — SODIUM CHLORIDE 0.9 % IV SOLN: 8.0000 mg | Freq: Once | INTRAVENOUS | Status: DC

## 2011-10-10 MED ORDER — PACLITAXEL CHEMO INJECTION 300 MG/50ML
80.0000 mg/m2 | Freq: Once | INTRAVENOUS | Status: AC
  Administered 2011-10-10: 180 mg via INTRAVENOUS
  Filled 2011-10-10: qty 30

## 2011-10-10 MED ORDER — FAMOTIDINE IN NACL 20-0.9 MG/50ML-% IV SOLN
20.0000 mg | Freq: Once | INTRAVENOUS | Status: AC
  Administered 2011-10-10: 20 mg via INTRAVENOUS
  Filled 2011-10-10: qty 50

## 2011-10-10 NOTE — Progress Notes (Signed)
Tolerated chemo well. 

## 2011-10-18 ENCOUNTER — Encounter (HOSPITAL_COMMUNITY): Payer: Medicaid Other

## 2011-10-18 ENCOUNTER — Encounter (HOSPITAL_BASED_OUTPATIENT_CLINIC_OR_DEPARTMENT_OTHER): Payer: Medicaid Other

## 2011-10-18 DIAGNOSIS — C50119 Malignant neoplasm of central portion of unspecified female breast: Secondary | ICD-10-CM

## 2011-10-18 LAB — COMPREHENSIVE METABOLIC PANEL
ALT: 22 U/L (ref 0–35)
AST: 16 U/L (ref 0–37)
Albumin: 3.4 g/dL — ABNORMAL LOW (ref 3.5–5.2)
Alkaline Phosphatase: 50 U/L (ref 39–117)
BUN: 9 mg/dL (ref 6–23)
CO2: 27 mEq/L (ref 19–32)
Calcium: 9 mg/dL (ref 8.4–10.5)
Chloride: 103 mEq/L (ref 96–112)
Creatinine, Ser: 0.69 mg/dL (ref 0.50–1.10)
GFR calc Af Amer: >90 mL/min (ref >90)
GFR calc non Af Amer: >90 mL/min (ref >90)
Potassium: 3.4 mEq/L — ABNORMAL LOW (ref 3.5–5.1)
Sodium: 141 mEq/L (ref 135–145)
Total Bilirubin: 0.4 mg/dL (ref 0.3–1.2)
Total Protein: 6.9 g/dL (ref 6.0–8.3)

## 2011-10-18 LAB — CBC
Hemoglobin: 10.1 g/dL — ABNORMAL LOW (ref 12.0–15.0)
MCH: 29.6 pg (ref 26.0–34.0)
MCHC: 31.7 g/dL (ref 30.0–36.0)
MCV: 93.5 fL (ref 78.0–100.0)
Platelets: 220 10*3/uL (ref 150–400)
RBC: 3.41 MIL/uL — ABNORMAL LOW (ref 3.87–5.11)
WBC: 6.8 10*3/uL (ref 4.0–10.5)

## 2011-10-18 LAB — DIFFERENTIAL
Basophils Absolute: 0 10*3/uL (ref 0.0–0.1)
Basophils Relative: 0 % (ref 0–1)
Eosinophils Absolute: 0.1 10*3/uL (ref 0.0–0.7)
Lymphs Abs: 1.6 10*3/uL (ref 0.7–4.0)
Monocytes Relative: 8 % (ref 3–12)
Neutro Abs: 4.6 10*3/uL (ref 1.7–7.7)
Neutrophils Relative %: 68 % (ref 43–77)

## 2011-10-18 MED ORDER — HEPARIN SOD (PORK) LOCK FLUSH 100 UNIT/ML IV SOLN
INTRAVENOUS | Status: AC
  Filled 2011-10-18: qty 5

## 2011-10-18 MED ORDER — HEPARIN SOD (PORK) LOCK FLUSH 100 UNIT/ML IV SOLN
500.0000 [IU] | Freq: Once | INTRAVENOUS | Status: AC
  Administered 2011-10-18: 500 [IU] via INTRAVENOUS
  Filled 2011-10-18: qty 5

## 2011-10-18 MED ORDER — SODIUM CHLORIDE 0.9 % IJ SOLN
INTRAMUSCULAR | Status: AC
  Filled 2011-10-18: qty 10

## 2011-10-18 MED ORDER — SODIUM CHLORIDE 0.9 % IJ SOLN
10.0000 mL | INTRAMUSCULAR | Status: DC | PRN
  Administered 2011-10-18: 10 mL via INTRAVENOUS
  Filled 2011-10-18: qty 10

## 2011-10-18 NOTE — Progress Notes (Signed)
[name] presented for Portacath access and flush. Proper placement of portacath confirmed by CXR. Portacath located left chest wall accessed with  H 20 needle. Good blood return present. Portacath flushed with 20ml NS and 500U/68ml Heparin and needle remained in place for chemotherapy tomorrow. Procedure without incident. Patient tolerated procedure well.

## 2011-10-19 ENCOUNTER — Inpatient Hospital Stay (HOSPITAL_COMMUNITY): Payer: Medicaid Other

## 2011-10-19 ENCOUNTER — Encounter (HOSPITAL_BASED_OUTPATIENT_CLINIC_OR_DEPARTMENT_OTHER): Payer: Medicaid Other

## 2011-10-19 VITALS — BP 128/80 | HR 101 | Temp 98.2°F

## 2011-10-19 DIAGNOSIS — C50119 Malignant neoplasm of central portion of unspecified female breast: Secondary | ICD-10-CM

## 2011-10-19 DIAGNOSIS — R05 Cough: Secondary | ICD-10-CM

## 2011-10-19 DIAGNOSIS — Z5111 Encounter for antineoplastic chemotherapy: Secondary | ICD-10-CM

## 2011-10-19 DIAGNOSIS — R059 Cough, unspecified: Secondary | ICD-10-CM

## 2011-10-19 DIAGNOSIS — C50919 Malignant neoplasm of unspecified site of unspecified female breast: Secondary | ICD-10-CM

## 2011-10-19 DIAGNOSIS — J029 Acute pharyngitis, unspecified: Secondary | ICD-10-CM

## 2011-10-19 DIAGNOSIS — R058 Other specified cough: Secondary | ICD-10-CM

## 2011-10-19 MED ORDER — DEXTROSE 5 % IV SOLN
80.0000 mg/m2 | Freq: Once | INTRAVENOUS | Status: AC
  Administered 2011-10-19: 180 mg via INTRAVENOUS
  Filled 2011-10-19: qty 30

## 2011-10-19 MED ORDER — SODIUM CHLORIDE 0.9 % IJ SOLN
10.0000 mL | INTRAMUSCULAR | Status: DC | PRN
  Administered 2011-10-19: 10 mL
  Filled 2011-10-19: qty 10

## 2011-10-19 MED ORDER — FAMOTIDINE IN NACL 20-0.9 MG/50ML-% IV SOLN
20.0000 mg | Freq: Once | INTRAVENOUS | Status: AC
  Administered 2011-10-19: 20 mg via INTRAVENOUS
  Filled 2011-10-19: qty 50

## 2011-10-19 MED ORDER — DIPHENHYDRAMINE HCL 50 MG/ML IJ SOLN
50.0000 mg | Freq: Once | INTRAMUSCULAR | Status: AC
  Administered 2011-10-19: 50 mg via INTRAVENOUS

## 2011-10-19 MED ORDER — ACETAMINOPHEN 325 MG PO TABS
650.0000 mg | ORAL_TABLET | Freq: Four times a day (QID) | ORAL | Status: DC | PRN
  Administered 2011-10-19 (×2): 325 mg via ORAL
  Filled 2011-10-19: qty 2

## 2011-10-19 MED ORDER — SODIUM CHLORIDE 0.9 % IV SOLN
Freq: Once | INTRAVENOUS | Status: AC
  Administered 2011-10-19: 12:00:00 via INTRAVENOUS

## 2011-10-19 MED ORDER — SODIUM CHLORIDE 0.9 % IJ SOLN
INTRAMUSCULAR | Status: AC
  Filled 2011-10-19: qty 10

## 2011-10-19 MED ORDER — SODIUM CHLORIDE 0.9 % IV SOLN: 8.0000 mg | Freq: Once | INTRAVENOUS | Status: DC

## 2011-10-19 MED ORDER — ACETAMINOPHEN 500 MG PO TABS
ORAL_TABLET | ORAL | Status: AC
  Filled 2011-10-19: qty 1

## 2011-10-19 MED ORDER — DIPHENHYDRAMINE HCL 50 MG/ML IJ SOLN
INTRAMUSCULAR | Status: AC
  Filled 2011-10-19: qty 1

## 2011-10-19 MED ORDER — HEPARIN SOD (PORK) LOCK FLUSH 100 UNIT/ML IV SOLN
500.0000 [IU] | Freq: Once | INTRAVENOUS | Status: AC | PRN
  Administered 2011-10-19: 500 [IU]
  Filled 2011-10-19: qty 5

## 2011-10-19 MED ORDER — SODIUM CHLORIDE 0.9 % IV SOLN
Freq: Once | INTRAVENOUS | Status: AC
  Administered 2011-10-19: 8 mg via INTRAVENOUS
  Filled 2011-10-19: qty 4

## 2011-10-19 MED ORDER — AMOXICILLIN-POT CLAVULANATE 875-125 MG PO TABS: 1.0000 | ORAL_TABLET | Freq: Two times a day (BID) | ORAL | Status: AC

## 2011-10-19 MED ORDER — HEPARIN SOD (PORK) LOCK FLUSH 100 UNIT/ML IV SOLN
INTRAVENOUS | Status: AC
  Filled 2011-10-19: qty 5

## 2011-10-19 MED ORDER — DEXAMETHASONE SODIUM PHOSPHATE 4 MG/ML IJ SOLN: 20.0000 mg | Freq: Once | INTRAMUSCULAR | Status: DC

## 2011-10-19 NOTE — Progress Notes (Signed)
1204 temp of 99.5. States has had a h/a for 3 days.  Jenita Seashore. PA notified. Tylenol 650 mg given po.  Tom in to further evaluate. 1230 temp down to 98.8.  Expresses relief from H/A. 1400 Tolerated chemo well.

## 2011-10-25 ENCOUNTER — Telehealth (HOSPITAL_COMMUNITY): Payer: Self-pay | Admitting: Oncology

## 2011-10-25 ENCOUNTER — Other Ambulatory Visit (HOSPITAL_COMMUNITY): Payer: Self-pay | Admitting: Oncology

## 2011-10-25 DIAGNOSIS — K219 Gastro-esophageal reflux disease without esophagitis: Secondary | ICD-10-CM

## 2011-10-25 MED ORDER — ESOMEPRAZOLE MAGNESIUM 40 MG PO CPDR: 40.0000 mg | DELAYED_RELEASE_CAPSULE | Freq: Every day | ORAL | Status: DC

## 2011-10-30 ENCOUNTER — Other Ambulatory Visit: Payer: Self-pay | Admitting: Certified Registered Nurse Anesthetist

## 2011-11-15 ENCOUNTER — Other Ambulatory Visit (HOSPITAL_COMMUNITY): Payer: Self-pay | Admitting: Oncology

## 2011-11-15 DIAGNOSIS — K219 Gastro-esophageal reflux disease without esophagitis: Secondary | ICD-10-CM

## 2011-11-15 MED ORDER — OMEPRAZOLE 40 MG PO CPDR: 40.0000 mg | DELAYED_RELEASE_CAPSULE | Freq: Every day | ORAL | Status: DC

## 2011-11-16 ENCOUNTER — Other Ambulatory Visit (HOSPITAL_COMMUNITY): Payer: Self-pay | Admitting: Oncology

## 2011-11-16 DIAGNOSIS — R52 Pain, unspecified: Secondary | ICD-10-CM

## 2011-11-16 MED ORDER — HYDROCODONE-ACETAMINOPHEN 5-325 MG PO TABS: 1.0000 | ORAL_TABLET | Freq: Four times a day (QID) | ORAL | Status: AC | PRN

## 2011-12-03 ENCOUNTER — Encounter (HOSPITAL_COMMUNITY): Payer: Medicaid Other

## 2011-12-03 ENCOUNTER — Encounter (HOSPITAL_COMMUNITY): Payer: Medicaid Other | Attending: Oncology | Admitting: Oncology

## 2011-12-03 DIAGNOSIS — G620 Drug-induced polyneuropathy: Secondary | ICD-10-CM | POA: Insufficient documentation

## 2011-12-03 DIAGNOSIS — T50904A Poisoning by unspecified drugs, medicaments and biological substances, undetermined, initial encounter: Secondary | ICD-10-CM | POA: Insufficient documentation

## 2011-12-03 DIAGNOSIS — G609 Hereditary and idiopathic neuropathy, unspecified: Secondary | ICD-10-CM

## 2011-12-03 DIAGNOSIS — E669 Obesity, unspecified: Secondary | ICD-10-CM

## 2011-12-03 DIAGNOSIS — I10 Essential (primary) hypertension: Secondary | ICD-10-CM

## 2011-12-03 DIAGNOSIS — C50119 Malignant neoplasm of central portion of unspecified female breast: Secondary | ICD-10-CM

## 2011-12-03 DIAGNOSIS — R112 Nausea with vomiting, unspecified: Secondary | ICD-10-CM | POA: Insufficient documentation

## 2011-12-03 DIAGNOSIS — C50919 Malignant neoplasm of unspecified site of unspecified female breast: Secondary | ICD-10-CM | POA: Insufficient documentation

## 2011-12-03 LAB — COMPREHENSIVE METABOLIC PANEL
AST: 18 U/L (ref 0–37)
Albumin: 3.9 g/dL (ref 3.5–5.2)
Alkaline Phosphatase: 58 U/L (ref 39–117)
BUN: 10 mg/dL (ref 6–23)
CO2: 29 mEq/L (ref 19–32)
Calcium: 9.8 mg/dL (ref 8.4–10.5)
Chloride: 102 mEq/L (ref 96–112)
GFR calc Af Amer: >90 mL/min (ref >90)
GFR calc non Af Amer: >90 mL/min (ref >90)
Glucose, Bld: 91 mg/dL (ref 70–99)
Potassium: 3.5 mEq/L (ref 3.5–5.1)
Sodium: 141 mEq/L (ref 135–145)
Total Bilirubin: 0.2 mg/dL — ABNORMAL LOW (ref 0.3–1.2)

## 2011-12-03 LAB — DIFFERENTIAL
Basophils Absolute: 0 10*3/uL (ref 0.0–0.1)
Lymphocytes Relative: 21 % (ref 12–46)
Lymphs Abs: 1.4 10*3/uL (ref 0.7–4.0)
Monocytes Relative: 8 % (ref 3–12)
Neutro Abs: 4.3 10*3/uL (ref 1.7–7.7)
Neutrophils Relative %: 67 % (ref 43–77)

## 2011-12-03 LAB — CBC
HCT: 35.6 % — ABNORMAL LOW (ref 36.0–46.0)
Hemoglobin: 10.7 g/dL — ABNORMAL LOW (ref 12.0–15.0)
MCV: 90.1 fL (ref 78.0–100.0)
RBC: 3.95 MIL/uL (ref 3.87–5.11)

## 2011-12-03 MED ORDER — GABAPENTIN 300 MG PO CAPS: 300.0000 mg | ORAL_CAPSULE | Freq: Three times a day (TID) | ORAL | Status: DC

## 2011-12-03 MED ORDER — SODIUM CHLORIDE 0.9 % IJ SOLN
10.0000 mL | INTRAMUSCULAR | Status: DC | PRN
  Administered 2011-12-03: 10 mL via INTRAVENOUS
  Filled 2011-12-03: qty 10

## 2011-12-03 MED ORDER — HEPARIN SOD (PORK) LOCK FLUSH 100 UNIT/ML IV SOLN
500.0000 [IU] | Freq: Once | INTRAVENOUS | Status: AC
  Administered 2011-12-03: 500 [IU] via INTRAVENOUS
  Filled 2011-12-03: qty 5

## 2011-12-03 MED ORDER — SODIUM CHLORIDE 0.9 % IJ SOLN
INTRAMUSCULAR | Status: AC
  Administered 2011-12-03: 10 mL via INTRAVENOUS
  Filled 2011-12-03: qty 10

## 2011-12-03 MED ORDER — HEPARIN SOD (PORK) LOCK FLUSH 100 UNIT/ML IV SOLN
INTRAVENOUS | Status: AC
  Administered 2011-12-03: 500 [IU] via INTRAVENOUS
  Filled 2011-12-03: qty 5

## 2011-12-03 MED ORDER — ONDANSETRON HCL 8 MG PO TABS: 8.0000 mg | ORAL_TABLET | Freq: Three times a day (TID) | ORAL | Status: AC | PRN

## 2011-12-03 NOTE — Patient Instructions (Signed)
[facility]  Discharge Instructions  RECOMMENDATIONS MADE BY THE CONSULTANT AND ANY TEST RESULTS WILL BE SENT TO YOUR REFERRING DOCTOR.   EXAM FINDINGS BY MD TODAY AND SIGNS AND SYMPTOMS TO REPORT TO CLINIC OR PRIMARY MD: Rx's for Zofran, Gabapentin and HCTZ called in to your pharmacy. Port flush done today, we will call you only if there are any abnormal results. Return to clinic in 6 weeks for port flush and to see the doctor.   I acknowledge that I have been informed and understand all the instructions given to me and received a copy. I do not have any more questions at this time, but understand that I may call the Specialty Clinic at [facility] at [phone] during business hours should I have any further questions or need assistance in obtaining follow-up care.    __________________________________________  _____________  __________ Signature of Patient or Authorized Representative            Date                   Time    __________________________________________ Nurse's Signature

## 2011-12-03 NOTE — Progress Notes (Signed)
[name] presented for Portacath access and flush. Proper placement of portacath confirmed by CXR. Portacath located left chest wall accessed with  H 20 needle. Good blood return present. Portacath flushed with 20ml NS and 500U/49ml Heparin and needle removed intact. Procedure without incident. Patient tolerated procedure well.

## 2011-12-03 NOTE — Progress Notes (Signed)
CC:   [name], M.D. [facility] [name], M.D. Radene Gunning, M.D., Ph.D.  DIAGNOSES: 1. Stage II cancer of the breast on the right with a 3.8 cm mass,     grade 3 invasive ductal carcinoma with clear margins no     lymphovascular space invasion and a single negative sentinel node.     ER receptors were 0, PR receptor was 2% with moderate staining     only.  Ki-67 marker was high at 90%, HER-2/neu was negative.  CT     scans and bone scan showed no evidence for metastatic disease.  She     is here today for followup. 2. Obesity. 3. 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 4. Diabetes mellitus on metformin. 5. Hypercholesterolemia. 6. Hypertension on Toprol and hydrochlorothiazide, and she needs a     refill on her hydrochlorothiazide which we will do today. [name] is here today.  Her vital signs are still significant for obesity.  She weighs 261 pounds, up from 248 pounds when she presented. Her blood pressure, though, is quite well-controlled, 130/84.  She has lots of pain in her arms and legs and the basically forearms and below the knees but they do start just above the knees in the in the lower thighs.  It is burning numbness, tingling, and she cannot sleep at night well.  She is getting radiation.  She has into her 3rd week, has 3-1/2 more weeks.  We have opted to try her on tamoxifen even though she was so barely positive from a progesterone receptor standpoint, but certainly people are trying this even with marginally positive patients.  PHYSICAL EXAMINATION:  No adenopathy.  She has no erythema yet over the right breast or axilla.  She has no masses in either breast.  Port is intact.  Lungs:  Clear.  Heart:  Shows a regular rhythm and rate without murmur, rub or gallop.  Abdomen:  Soft and nontender, obese.  She has no leg edema.  She is a little puffy in her legs pretibially.  She had some blood work today.  We will see her back in  5-6 weeks.  We will flush her port today.  We are going to renew her ondansetron since she has been nauseated since starting the radiation and will start on gabapentin 300 mg b.i.d. for 5-7 days, then t.i.d., and she will let me know how she is doing.    ______________________________ [name], MD ESN/MEDQ  D:  12/03/2011  T:  12/03/2011  Job:  [id number]

## 2011-12-03 NOTE — Progress Notes (Signed)
This office note has been dictated.

## 2011-12-04 ENCOUNTER — Telehealth (HOSPITAL_COMMUNITY): Payer: Self-pay | Admitting: *Deleted

## 2011-12-04 NOTE — Telephone Encounter (Signed)
[name] made aware via voicemail that her labs looked great per Dr. Mariel Sleet.

## 2011-12-05 ENCOUNTER — Other Ambulatory Visit (HOSPITAL_COMMUNITY): Payer: Self-pay | Admitting: Oncology

## 2011-12-05 NOTE — Progress Notes (Signed)
This office note has been dictated.

## 2011-12-05 NOTE — Miscellaneous (Signed)
[name]'s pathology was revisited yesterday by me after seeing her on the 14th.  I retrieved her old chart and her pathology has 2 breast prognostic profiles in it.  One shows ER 0%, PR 2%, and the repeat one shows the ER/PR both 0%.  HER-2 was negative for both.  So I discussed her case today with Dr. Pecola Leisure in pathology and he is the pathologist who did the 2nd breast prognostic profile, which was totally negative for both ER and PR receptors.  He believes that we are really dealing with a triple-negative breast cancer and that she truly is ER/PR negative.  So with that, when I see her back I will probably not give her tamoxifen since it holds a greater chance of doing danger due to side effects rather than any true beneficial improvement in her chances for decreasing recurrence by adding that drug at this time.  I will discuss this with her.    ______________________________ [name], MD ESN/MEDQ  D:  12/05/2011  T:  12/05/2011  Job:  [id number]

## 2011-12-10 ENCOUNTER — Ambulatory Visit (HOSPITAL_COMMUNITY): Payer: Medicaid Other | Admitting: Oncology

## 2011-12-11 ENCOUNTER — Telehealth (HOSPITAL_COMMUNITY): Payer: Self-pay | Admitting: Oncology

## 2011-12-11 ENCOUNTER — Other Ambulatory Visit (HOSPITAL_COMMUNITY): Payer: Self-pay | Admitting: Oncology

## 2011-12-11 ENCOUNTER — Telehealth (HOSPITAL_COMMUNITY): Payer: Self-pay | Admitting: *Deleted

## 2011-12-11 ENCOUNTER — Ambulatory Visit (HOSPITAL_COMMUNITY): Payer: Medicaid Other | Admitting: Oncology

## 2011-12-11 DIAGNOSIS — R058 Other specified cough: Secondary | ICD-10-CM

## 2011-12-11 DIAGNOSIS — R05 Cough: Secondary | ICD-10-CM

## 2011-12-11 DIAGNOSIS — R059 Cough, unspecified: Secondary | ICD-10-CM

## 2011-12-11 DIAGNOSIS — I10 Essential (primary) hypertension: Secondary | ICD-10-CM

## 2011-12-11 MED ORDER — SULFAMETHOXAZOLE-TMP DS 800-160 MG PO TABS: 1.0000 | ORAL_TABLET | Freq: Two times a day (BID) | ORAL | Status: DC

## 2011-12-11 MED ORDER — HYDROCHLOROTHIAZIDE 12.5 MG PO CAPS: 12.5000 mg | ORAL_CAPSULE | Freq: Every day | ORAL | Status: DC

## 2011-12-11 NOTE — Telephone Encounter (Signed)
Patient reports that Dr. Mariel Sleet refilled her Hydrochlorothiazide, but it never went to her pharmacy.  I will refill this medication.  Secondly, the patient reports a cough productive of green/yellow sputum.  She reports that she has not had an antibiotic recently, but last month she received Augmentin.  Therefore, we will not utilize this medication.  It does not appear that the patient has had a Z-Pack recently and her last Bactrim Rx was in July.  I will opt to utilize the Septra DS.  This will be escribed.  Nalea Salce

## 2012-01-14 ENCOUNTER — Encounter (HOSPITAL_COMMUNITY): Payer: Medicaid Other

## 2012-01-14 ENCOUNTER — Ambulatory Visit (HOSPITAL_COMMUNITY): Payer: Medicaid Other | Admitting: Oncology

## 2012-01-18 HISTORY — PX: COLONOSCOPY, ESOPHAGOGASTRODUODENOSCOPY (EGD) AND ESOPHAGEAL DILATION: SHX5781

## 2012-01-23 ENCOUNTER — Encounter (HOSPITAL_COMMUNITY): Payer: Medicaid Other

## 2012-01-23 ENCOUNTER — Encounter: Payer: Self-pay | Admitting: Gastroenterology

## 2012-01-23 ENCOUNTER — Encounter (HOSPITAL_COMMUNITY): Payer: Medicaid Other | Attending: Oncology | Admitting: Oncology

## 2012-01-23 VITALS — BP 122/86 | HR 94 | Temp 98.2°F | Ht 64.0 in | Wt 257.0 lb

## 2012-01-23 DIAGNOSIS — E119 Type 2 diabetes mellitus without complications: Secondary | ICD-10-CM

## 2012-01-23 DIAGNOSIS — M79609 Pain in unspecified limb: Secondary | ICD-10-CM

## 2012-01-23 DIAGNOSIS — T50904A Poisoning by unspecified drugs, medicaments and biological substances, undetermined, initial encounter: Secondary | ICD-10-CM | POA: Insufficient documentation

## 2012-01-23 DIAGNOSIS — M79603 Pain in arm, unspecified: Secondary | ICD-10-CM

## 2012-01-23 DIAGNOSIS — C50919 Malignant neoplasm of unspecified site of unspecified female breast: Secondary | ICD-10-CM

## 2012-01-23 DIAGNOSIS — C50119 Malignant neoplasm of central portion of unspecified female breast: Secondary | ICD-10-CM

## 2012-01-23 LAB — BASIC METABOLIC PANEL
BUN: 11 mg/dL (ref 6–23)
CO2: 29 mEq/L (ref 19–32)
Calcium: 9.7 mg/dL (ref 8.4–10.5)
Chloride: 98 mEq/L (ref 96–112)
GFR calc non Af Amer: >90 mL/min (ref >90)
Glucose, Bld: 101 mg/dL — ABNORMAL HIGH (ref 70–99)
Potassium: 3.6 mEq/L (ref 3.5–5.1)

## 2012-01-23 LAB — DIFFERENTIAL
Basophils Relative: 1 % (ref 0–1)
Eosinophils Relative: 3 % (ref 0–5)
Lymphocytes Relative: 22 % (ref 12–46)
Lymphs Abs: 1.3 10*3/uL (ref 0.7–4.0)
Monocytes Absolute: 0.5 10*3/uL (ref 0.1–1.0)
Monocytes Relative: 8 % (ref 3–12)
Neutro Abs: 4 10*3/uL (ref 1.7–7.7)
Neutrophils Relative %: 66 % (ref 43–77)

## 2012-01-23 LAB — CBC
Hemoglobin: 11.4 g/dL — ABNORMAL LOW (ref 12.0–15.0)
MCH: 25.3 pg — ABNORMAL LOW (ref 26.0–34.0)
MCV: 82.2 fL (ref 78.0–100.0)
Platelets: 237 10*3/uL (ref 150–400)
RBC: 4.5 MIL/uL (ref 3.87–5.11)
RDW: 16.2 % — ABNORMAL HIGH (ref 11.5–15.5)

## 2012-01-23 MED ORDER — SODIUM CHLORIDE 0.9 % IJ SOLN
INTRAMUSCULAR | Status: AC
  Administered 2012-01-23: 10 mL via INTRAVENOUS
  Filled 2012-01-23: qty 10

## 2012-01-23 MED ORDER — HEPARIN SOD (PORK) LOCK FLUSH 100 UNIT/ML IV SOLN
INTRAVENOUS | Status: AC
  Administered 2012-01-23: 500 [IU] via INTRAVENOUS
  Filled 2012-01-23: qty 5

## 2012-01-23 MED ORDER — HEPARIN SOD (PORK) LOCK FLUSH 100 UNIT/ML IV SOLN
500.0000 [IU] | Freq: Once | INTRAVENOUS | Status: AC
  Administered 2012-01-23: 500 [IU] via INTRAVENOUS

## 2012-01-23 MED ORDER — SODIUM CHLORIDE 0.9 % IJ SOLN
10.0000 mL | Freq: Once | INTRAMUSCULAR | Status: AC
  Administered 2012-01-23: 10 mL via INTRAVENOUS

## 2012-01-23 MED ORDER — NAPROXEN 500 MG PO TABS: 500.0000 mg | ORAL_TABLET | Freq: Three times a day (TID) | ORAL | Status: DC

## 2012-01-23 NOTE — Progress Notes (Signed)
Jill Burrow, MD, MD [facility] [address], [address] [place] [address]  1. Breast cancer  CBC, Differential, Basic metabolic panel, CBC, Differential, Basic metabolic panel, CBC, Differential, Comprehensive metabolic panel  2. Upper extremity pain  naproxen (NAPROSYN) 500 MG tablet    CURRENT THERAPY:S/P radiation completing in Feb 2013.  S/P AC x 4 followed by weekly Taxol chemotherapy (completed on 10/19/2011).  S/P Lumpectomy. INTERVAL HISTORY: [name] 46 y.o. female returns for  regular  visit for followup of  Stage II cancer of the breast on the right with a 3.8 cm mass, grade 3 invasive ductal carcinoma with clear margins no lymphovascular space invasion and a single negative sentinel node. ER receptors were 0, PR receptor was 2% with moderate staining only. Ki-67 marker was high at 90%, HER-2/neu was negative. CT scans and bone scan showed no evidence for metastatic disease.   The patient has a few complaints today.  Her main complaint is bilateral wrist and forearm numbness/tingling/pain. She reports that it is been getting worse over time. She is not taking gabapentin due to the potential side effects of weight gain. The patient reports that occasionally she wakes up in the middle of the night with wrist discomfort and pain. She does not knowing only sleep with her wrist in a flexed position. Physical exam reveals tenderness palpation of the extensor muscles of the forearm.  The Phalen test and Tinel test is negative bilaterally. The Tinel test reveals tingling at the point of percussion  but does not radiate or migrate to the distribution of the nerve of the fingers.  Patient also asks when she can have her port removed. I've asked her to wait at least 6 months time and discuss this further with Dr. Mariel [name] on her next followup appointment.   The patient completed radiation approximately 3 weeks ago. She is doing quite well since completion of  radiation.  We spent some time discussing her pathology results which revealed a triple negative breast cancer that was grade 3. She understands that she does not need anti-hormonal therapy due to her ER and PR negativity.  The patient asks me about her recurrence rate. I've quoted her 20-25% recurrence rate over 10 years.  Patient reports that she had a colonoscopy by Dr. Jonette [name] approximately 3 years ago. The patient reports that it was recommended that she have a followup colonoscopy in 3 years. As result, the patient asks if she can be referred to Dr. Darrick [name] for consideration of followup colonoscopy.   We will make that referral.  The patient notes that she has symptoms of her monthly menstrual cycle, but she has not had any signs of menstruation, namely vaginal bleeding. She reports that she has symptoms of abdominal cramping and discomfort however. I suspect that her menstruation will restart in the future. However she continues to have problems, this may warrant a referral to gynecology.  ROS: No TIA's or unusual headaches, no dysphagia.  No prolonged cough. No dyspnea or chest pain on exertion.  No abdominal pain, change in bowel habits, black or bloody stools.  No urinary tract symptoms.  No new or unusual musculoskeletal symptoms.     Past Medical History  Diagnosis Date  . Diabetes mellitus   . Hypertension   . Breast cancer 05/22/2011    03/01/11  . Depression 06/22/2011  . GERD (gastroesophageal reflux disease) 06/22/2011  . DM (diabetes mellitus) 06/22/2011  . Hypercholesterolemia 06/22/2011  . Hypertension 06/22/2011  .  Obesity 06/22/2011    has Breast cancer; Depression; GERD (gastroesophageal reflux disease); DM (diabetes mellitus); Hypercholesterolemia; Hypertension; and Obesity on her problem list.      has no known allergies.  [name] does not currently have medications on file.  Past Surgical History  Procedure Date  . Back surg x2   . Cholecystectomy   . Mm  breast stereo bx*[name]*r/s     rt.  . Mastectomy partial / lumpectomy     rt.  . Colonoscopy   . Portacath placement     Denies any headaches, dizziness, double vision, fevers, chills, night sweats, nausea, vomiting, diarrhea, constipation, chest pain, heart palpitations, shortness of breath, blood in stool, black tarry stool, urinary pain, urinary burning, urinary frequency, hematuria.   PHYSICAL EXAMINATION  ECOG PERFORMANCE STATUS: 1 - Symptomatic but completely ambulatory  Filed Vitals:   01/23/12 1210  BP: 122/86  Pulse: 94  Temp: 98.2 F (36.8 C)    GENERAL:alert, no distress, well nourished, well developed, comfortable, cooperative, obese and smiling SKIN: skin color, texture, turgor are normal, no rashes or significant lesions HEAD: Normocephalic, No masses, lesions, tenderness or abnormalities EYES: normal EARS: External ears normal OROPHARYNX:mucous membranes are moist and tongue piercing noted  NECK: supple, no adenopathy, thyroid normal size, non-tender, without nodularity, no stridor, non-tender, trachea midline LYMPH:  no palpable lymphadenopathy BREAST:left breast normal without mass, skin or nipple changes or axillary nodes, right post-lumpectomy site well healed and free of suspicious changes, slight hyperpigmentation on right from radiation.  No masses or lesions noted. LUNGS: clear to auscultation and percussion HEART: regular rate & rhythm, no murmurs, no gallops, S1 normal and S2 normal ABDOMEN:abdomen soft, non-tender, obese, normal bowel sounds, no masses or organomegaly and difficult to assess for hepatosplenomegaly due to body habitus BACK: Back symmetric, no curvature., No CVA tenderness EXTREMITIES:less then 2 second capillary refill, no joint deformities, effusion, or inflammation, no edema, no skin discoloration, no clubbing, no cyanosis, negative Phalen and Tinel test.  Tenderness noted in the extensor forearm muscles. NEURO: alert & oriented x 3 with  fluent speech, no focal motor/sensory deficits, gait normal   PENDING LABS: CBC diff, BMET   PATHOLOGY: 03/21/2011  FINAL DIAGNOSIS Diagnosis 1. Lymph node, sentinel, biopsy, right - ONE LYMPH NODE, NEGATIVE FOR METASTATIC CARCINOMA (0/1). 2. Breast, lumpectomy, right - INVASIVE DUCTAL CARCINOMA, 3.8 CM, NOTTINGHAM COMBINED HISTOLOGIC GRADE III. - NO EVIDENCE OF ANGIOLYMPHATIC INVASION IDENTIFIED. - RESECTION MARGINS ARE CLEAR - PLEASE SEE ONCOLOGY TEMPLATE FOR DETAIL. Microscopic Comment 2. BREAST, WITH LYMPH NODE SAMPLING Specimen, including laterality: Right breast Procedure: Lumpectomy Tumor size of largest invasive carcinoma (gross measurement): 3.8 cm Margins: Negative Invasive component, distance to closest margin: Superior and lateral margins: 0.2 cm In situ component, distance to closest margin: Inferior margin: 0.1 cm Lymph - Vascular invasion: Not identified Histologic type, invasive component: Invasive ductal carcinoma Grade, invasive component (Nottingham combined histologic score): III Tubule formation grade: 3 Nuclear pleomorphism grade: 2 Mitotic grade: 3 Ductal carcinoma in situ: Present Nuclear grade: Low grade Necrosis: Not identified Extensive intraductal component: No Lobular neoplasia present: No Treatment effect (if treated with neoadjuvant therapy): No Multicentric (separate tumors in different quadrants): N/A Multifocal (separate tumors in same quadrant or biopsy): No Macroscopic or microscopic extent of tumor: Skin: N/A 1 of 4 FINAL for [name], [name] ([id number]) Microscopic Comment(continued) Nipple: N/A Skeletal muscle: No Axillary lymph nodes: Number examined: 1 Number with metastasis: 0 ITC (isolated tumor cells, < 0.35mm): 0 Micrometastasis (> 0.10mm, <  2mm): 0 Metastasis > 2 mm: 0 Extracapsular extension: No Method of detection of metastases: H&E, cytokeratin or both: H & E only TNM Code: pT2, pN0 (sn) Breast Prognostic  Markers: Please correlate with previous case [id number] Estrogen receptor: 0%, negative Progesterone receptor: 2%, positive, moderate staining intensity. Ki 67 (Mib-1): 90% Her 2 neu by CISH: No amplification of Her 2 detected. The ratio of Her 2: CP17 signals was 1.00 A breast prognostic profile will be performed and an addendum report will follow. Non-neoplastic breast: Barrie Folk MD Pathologist, Electronic Signature (Case signed 03/23/2011) 2. PROGNOSTIC INDICATORS - ACIS Results IMMUNOHISTOCHEMICAL AND MORPHOMETRIC ANALYSIS BY THE AUTOMATED CELLULAR IMAGING SYSTEM (ACIS) Estrogen Receptor (Negative, <1%): 0%, NEGATIVE Progesterone Receptor (Negative, <1%): 0%, NEGATIVE COMMENT: The negative hormone receptor study(ies) in this case have an internal positive control. All controls stained appropriately Pecola Leisure MD Pathologist, Electronic Signature ( Signed 03/28/2011) 2. CHROMOGENIC IN-SITU HYBRIDIZATION Interpretation HER-2/NEU BY CISH - NO AMPLIFICATION OF HER-2 DETECTED. THE RATIO OF HER-2: CEP 17 SIGNALS WAS 1.40. Reference range: Ratio: HER2:CEP17 < 1.8 - gene amplification not observed Ratio: HER2:CEP 17 1.8-2.2 - equivocal result Ratio: HER2:CEP17 > 2.2 - gene amplification observed 2 of 4 FINAL for [name], [name] ([id number]) (continued) Pecola Leisure MD Pathologist, Electronic Signature ( Signed 03/27/2011)   ASSESSMENT:  1. Stage II cancer of the breast on the right with a 3.8 cm mass, grade 3 invasive ductal carcinoma with clear margins no lymphovascular space invasion and a single negative sentinel node. ER receptors were 0, PR receptor was 2% with moderate staining only. Ki-67 marker was high at 90%, HER-2/neu was negative. CT scans and bone scan showed no evidence for metastatic disease.  2. B/[name] wrist pain/numbness/tingling 3. Forearm extensor muscle discomfort. 4. Obesity.  5. 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  May  be involved with #2 as well. 6. Diabetes mellitus on metformin.  7. Hypercholesterolemia.  8. Hypertension on Toprol and hydrochlorothiazide   PLAN:  1. Port flush today with lab work: CBC diff, BMET 2. Rx for Naprosyn 500 mg TID with meals x 15 days 3. B/[name] wrist splints for potential carpal tunnel 4. The patient will discuss potential port tremoval with Dr. Mariel [name] with her next  5. Quoted the patient a 20- 25% risk of recurrence over 10 years. 6. Lab work in 6 months. 7. Patient asked to discuss port-a-cath removal on her next follow-up appointment.  8. Patient will call us in 3 months to let us know how her wrists and hands are feeling with wrist splints.  9. Referral to Dr. Darrick [name] for consideration of colonoscopy. 10. Return in 6 months for follow-up.  The patient will continue with yearly mammograms.   All questions were answered. The patient knows to call the clinic with any problems, questions or concerns. We can certainly see the patient much sooner if necessary.  The patient and plan discussed with Glenford Peers, MD and he is in agreement with the aforementioned.   Korin Hartwell

## 2012-01-23 NOTE — Progress Notes (Signed)
Addended by: Edythe Lynn A on: 01/23/2012 02:42 PM   Modules accepted: Orders

## 2012-01-23 NOTE — Progress Notes (Signed)
[name] presented for Portacath access and flush. Proper placement of portacath confirmed by CXR. Portacath located lt chest wall accessed with  H 20 needle. Good blood return present and labs drawn. Portacath flushed with 20ml NS and 500U/41ml Heparin and needle removed intact. Procedure without incident. Patient tolerated procedure well.

## 2012-01-23 NOTE — Patient Instructions (Signed)
[facility]  Discharge Instructions  RECOMMENDATIONS MADE BY THE CONSULTANT AND ANY TEST RESULTS WILL BE SENT TO YOUR REFERRING DOCTOR.   EXAM FINDINGS BY MD TODAY AND SIGNS AND SYMPTOMS TO REPORT TO CLINIC OR PRIMARY MD:  Exam per Jenita Seashore  MEDICATIONS PRESCRIBED:  Prescriptions given for naproxen and wrist splints INSTRUCTIONS GIVEN AND DISCUSSED: appt with Dr. Darrick Penna for colonoscopy   SPECIAL INSTRUCTIONS/FOLLOW-UP: Other (Referral/Appointments) 6 months to see Neijstrom   I acknowledge that I have been informed and understand all the instructions given to me and received a copy. I do not have any more questions at this time, but understand that I may call the Specialty Clinic at [facility] at [phone] during business hours should I have any further questions or need assistance in obtaining follow-up care.    __________________________________________  _____________  __________ Signature of Patient or Authorized Representative            Date                   Time    __________________________________________ Nurse's Signature

## 2012-01-24 ENCOUNTER — Encounter: Payer: Self-pay | Admitting: Gastroenterology

## 2012-01-24 ENCOUNTER — Ambulatory Visit (INDEPENDENT_AMBULATORY_CARE_PROVIDER_SITE_OTHER): Payer: Medicaid Other | Admitting: Gastroenterology

## 2012-01-24 VITALS — BP 113/80 | HR 86 | Temp 97.3°F | Ht 64.0 in | Wt 258.0 lb

## 2012-01-24 DIAGNOSIS — R1314 Dysphagia, pharyngoesophageal phase: Secondary | ICD-10-CM

## 2012-01-24 DIAGNOSIS — K219 Gastro-esophageal reflux disease without esophagitis: Secondary | ICD-10-CM

## 2012-01-24 DIAGNOSIS — Z86010 Personal history of colonic polyps: Secondary | ICD-10-CM

## 2012-01-24 DIAGNOSIS — R197 Diarrhea, unspecified: Secondary | ICD-10-CM

## 2012-01-24 DIAGNOSIS — K529 Noninfective gastroenteritis and colitis, unspecified: Secondary | ICD-10-CM | POA: Insufficient documentation

## 2012-01-24 DIAGNOSIS — Z800 Family history of malignant neoplasm of digestive organs: Secondary | ICD-10-CM

## 2012-01-24 DIAGNOSIS — R1310 Dysphagia, unspecified: Secondary | ICD-10-CM

## 2012-01-24 DIAGNOSIS — R1319 Other dysphagia: Secondary | ICD-10-CM | POA: Insufficient documentation

## 2012-01-24 DIAGNOSIS — Z860101 Personal history of adenomatous and serrated colon polyps: Secondary | ICD-10-CM | POA: Insufficient documentation

## 2012-01-24 MED ORDER — PEG-KCL-NACL-NASULF-NA ASC-C 100 G PO SOLR: 1.0000 | Freq: Once | ORAL | Status: DC

## 2012-01-24 NOTE — Progress Notes (Signed)
Faxed to PCP

## 2012-01-24 NOTE — Assessment & Plan Note (Signed)
Due for colonoscopy this year given h/o tubular adenoma, FH CRC 1st degree relative at young age. Also with change in bowels with swing from diarrhea to constipation. Likely IBS. I have discussed the risks, alternatives, benefits with regards to but not limited to the risk of reaction to medication, bleeding, infection, perforation and the patient is agreeable to proceed. Written consent to be obtained.

## 2012-01-24 NOTE — Assessment & Plan Note (Signed)
Breakthrough symptoms since switching to pantoprazole as dictated by Medicaid. Also with esophageal dysphagia. No prior EGD. Recommend EGD/ED at time of colonoscopy.

## 2012-01-24 NOTE — Patient Instructions (Signed)
We have scheduled you for an upper endoscopy and colonoscopy. See separate instructions.  Gastroesophageal Reflux Disease, Adult Gastroesophageal reflux disease (GERD) happens when acid from your stomach goes into your food pipe (esophagus). The acid can cause a burning feeling in your chest. Over time, the acid can make small holes (ulcers) in your food pipe.  HOME CARE  Ask your doctor for advice about:   Losing weight.   Quitting smoking.   Alcohol use.   Avoid foods and drinks that make your problems worse. You may want to avoid:   Caffeine and alcohol.   Chocolate.   Mints.   Garlic and onions.   Spicy foods.   Citrus fruits, such as oranges, lemons, or limes.   Foods that contain tomato, such as sauce, chili, salsa, and pizza.   Fried and fatty foods.   Avoid lying down for 3 hours before you go to bed or before you take a nap.   Eat small meals often, instead of large meals.   Wear loose-fitting clothing. Do not wear anything tight around your waist.   Raise (elevate) the head of your bed 6 to 8 inches with wood blocks. Using extra pillows does not help.   Only take medicines as told by your doctor.   Do not take aspirin or ibuprofen.  GET HELP RIGHT AWAY IF:   You have pain in your arms, neck, jaw, teeth, or back.   Your pain gets worse or changes.   You feel sick to your stomach (nauseous), throw up (vomit), or sweat (diaphoresis).   You feel short of breath, or you pass out (faint).   Your throw up is green, yellow, black, or looks like coffee grounds or blood.   Your poop (stool) is red, bloody, or black.  MAKE SURE YOU:   Understand these instructions.   Will watch your condition.   Will get help right away if you are not doing well or get worse.  Document Released: 04/23/2008 Document Revised: 10/25/2011 Document Reviewed: 05/25/2011 [facility] Patient Information 2012 [place], [place].  Calorie Counting Diet A calorie counting diet  requires you to eat the number of calories that are right for you in a day. Calories are the measurement of how much energy you get from the food you eat. Eating the right amount of calories is important for staying at a healthy weight. If you eat too many calories, your body will store them as fat and you may gain weight. If you eat too few calories, you may lose weight. Counting the number of calories you eat during a day will help you know if you are eating the right amount. A Registered Dietitian can determine how many calories you need in a day. The amount of calories needed varies from person to person. If your goal is to lose weight, you will need to eat fewer calories. Losing weight can benefit you if you are overweight or have health problems such as heart disease, high blood pressure, or diabetes. If your goal is to gain weight, you will need to eat more calories. Gaining weight may be necessary if you have a certain health problem that causes your body to need more energy. TIPS Whether you are increasing or decreasing the number of calories you eat during a day, it may be hard to get used to changes in what you eat and drink. The following are tips to help you keep track of the number of calories you eat.  Measure foods at  home with measuring cups. This helps you know the amount of food and number of calories you are eating.   Restaurants often serve food in amounts that are larger than 1 serving. While eating out, estimate how many servings of a food you are given. For example, a serving of cooked rice is  cup or about the size of half of a fist. Knowing serving sizes will help you be aware of how much food you are eating at restaurants.   Ask for smaller portion sizes or child-size portions at restaurants.   Plan to eat half of a meal at a restaurant. Take the rest home or share the other half with a friend.   Read the Nutrition Facts panel on food labels for calorie content and serving  size. You can find out how many servings are in a package, the size of a serving, and the number of calories each serving has.   For example, a package might contain 3 cookies. The Nutrition Facts panel on that package says that 1 serving is 1 cookie. Below that, it will say there are 3 servings in the container. The calories section of the Nutrition Facts label says there are 90 calories. This means there are 90 calories in 1 cookie (1 serving). If you eat 1 cookie you have eaten 90 calories. If you eat all 3 cookies, you have eaten 270 calories (3 servings x 90 calories = 270 calories).  The list below tells you how big or small some common portion sizes are.  1 oz.........4 stacked dice.   3 oz........Marland KitchenDeck of cards.   1 tsp.......Marland KitchenTip of little finger.   1 tbs......Marland KitchenMarland KitchenThumb.   2 tbs.......Marland KitchenGolf ball.    cup......Marland KitchenHalf of a fist.   1 cup.......Marland KitchenA fist.  KEEP A FOOD LOG Write down every food item you eat, the amount you eat, and the number of calories in each food you eat during the day. At the end of the day, you can add up the total number of calories you have eaten. It may help to keep a list like the one below. Find out the calorie information by reading the Nutrition Facts panel on food labels. Breakfast  Bran cereal (1 cup, 110 calories).   Fat-free milk ( cup, 45 calories).  Snack  Apple (1 medium, 80 calories).  Lunch  Spinach (1 cup, 20 calories).   Tomato ( medium, 20 calories).   Chicken breast strips (3 oz, 165 calories).   Shredded cheddar cheese ( cup, 110 calories).   Light [place] dressing (2 tbs, 60 calories).   Whole-wheat bread (1 slice, 80 calories).   Tub margarine (1 tsp, 35 calories).   Vegetable soup (1 cup, 160 calories).  Dinner  Pork chop (3 oz, 190 calories).   Brown rice (1 cup, 215 calories).   Steamed broccoli ( cup, 20 calories).   Strawberries (1  cup, 65 calories).   Whipped cream (1 tbs, 50 calories).  Daily Calorie  Total: 1425 Document Released: 11/05/2005 Document Revised: 10/25/2011 Document Reviewed: 05/02/2007 [facility] Patient Information 2012 [place], [place].

## 2012-01-24 NOTE — Progress Notes (Signed)
Primary Care Physician:  FERGUSON,JOHN V, MD, MD Referring physician: Dr. Eric Neijstrom Primary Gastroenterologist:  Sandi Fields, MD   Chief Complaint  Patient presents with  . Colonoscopy    HPI:  [name] is a 45 y.o. female here for surveillance colonoscopy at the request of Dr. Neijstrom. Last TCS in 2008, tubular adenoma. FH CRC, mother in her mid-50s, died with metastatic disease. Patient diagnosed with Stage 2 breast cancer one year ago. S/P lumpectomy, chemo, XRT.  Last several weeks, switched from diarrhea to constipation. No blood in stool since on chemo. Some intermittent rectal pain. Some abdominal cramps. Lots of gas. No vomiting. Heartburn was doing well on Nexium. Medicaid switched to pantoprazole for about two months, now with breakthrough symptoms throughout day. Solid food dysphagia. XRT done right axilla/right chest.   Current Outpatient Prescriptions  Medication Sig Dispense Refill  . fish oil-omega-3 fatty acids 1000 MG capsule Take 2 g by mouth daily. 1000mg cap.        . gabapentin (NEURONTIN) 300 MG capsule Take 1 capsule (300 mg total) by mouth 3 (three) times daily.  100 capsule  3  . hydrochlorothiazide (MICROZIDE) 12.5 MG capsule Take 1 capsule (12.5 mg total) by mouth daily. 25mg  1/2 tab daily  30 capsule  2  . metFORMIN (GLUMETZA) 500 MG (MOD) 24 hr tablet Take 500 mg by mouth 2 (two) times daily.        . metoprolol succinate (TOPROL-XL) 25 MG 24 hr tablet Take 25 mg by mouth daily.        . naproxen (NAPROSYN) 500 MG tablet Take 1 tablet (500 mg total) by mouth 3 (three) times daily with meals.  45 tablet  0  . pantoprazole (PROTONIX) 40 MG tablet Take 40 mg by mouth daily.      . PARoxetine (PAXIL CR) 37.5 MG 24 hr tablet Take 37.5 mg by mouth every morning.        . potassium chloride (KCL) 2 mEq/mL SOLN oral liquid Take 10 mL BID for 21 days, then 10 mL daily, PO  300 mL  1  . rosuvastatin (CRESTOR) 10 MG tablet Take 10 mg by mouth daily.             Allergies as of 01/24/2012  . (No Known Allergies)    Past Medical History  Diagnosis Date  . Diabetes mellitus   . Hypertension   . Breast cancer 05/22/2011    03/01/11  . Depression 06/22/2011  . GERD (gastroesophageal reflux disease) 06/22/2011  . DM (diabetes mellitus) 06/22/2011  . Hypercholesterolemia 06/22/2011  . Hypertension 06/22/2011  . Obesity 06/22/2011  . Hx of adenomatous colonic polyps 10/2007    4mm sigmoid tubular adenoma, FH colon cancer, mother in mid-50s  . Anxiety   . Depression     Past Surgical History  Procedure Date  . Back surg x2   . Cholecystectomy   . Mm breast stereo bx*l*r/s     rt.  . Mastectomy partial / lumpectomy     rt.  . Colonoscopy 11/03/07    4-mm sessile polyp removed/small internal hemorrhoids/tubular adenoma, random colon bx negative for microscopic colitis  . Portacath placement     Family History  Problem Relation Age of Onset  . Colon cancer Mother     mid-50s, died of metastatic disease  . Coronary artery disease Father   . Diabetes Father   . Bipolar disorder Sister     History   Social History  .   Marital Status: Legally Separated    Spouse Name: N/A    Number of Children: 3  . Years of Education: N/A   Occupational History  . Daycare worker    Social History Main Topics  . Smoking status: Never Smoker   . Smokeless tobacco: Not on file  . Alcohol Use: No  . Drug Use: No  . Sexually Active: Not on file   Other Topics Concern  . Not on file   Social History Narrative  . No narrative on file      ROS:  General: Negative for anorexia, weight loss, fever, chills, fatigue, weakness. Eyes: Negative for vision changes.  ENT: Negative for hoarseness, difficulty swallowing , nasal congestion. CV: Negative for chest pain, angina, palpitations, dyspnea on exertion, peripheral edema.  Respiratory: Negative for dyspnea at rest, dyspnea on exertion, cough, sputum, wheezing.  GI: See history of present  illness. GU:  Negative for dysuria, hematuria, urinary incontinence, urinary frequency, nocturnal urination.  MS: Negative for joint pain, low back pain.  Derm: Negative for rash or itching.  Neuro: Negative for weakness, seizure, frequent headaches, memory loss, confusion. Numbness/pain in upper extremities since chemo. Psych: Negative for anxiety, depression, suicidal ideation, hallucinations.  Endo: Negative for unusual weight change.  Heme: Negative for bruising or bleeding. Allergy: Negative for rash or hives.    Physical Examination:  BP 113/80  Pulse 86  Temp(Src) 97.3 F (36.3 C) (Temporal)  Ht 5' 4" (1.626 m)  Wt 258 lb (117.028 kg)  BMI 44.29 kg/m2  LMP 06/26/2011   General: Well-nourished, well-developed in no acute distress. Obese. Accompanied by boyfriend. Head: Normocephalic, atraumatic.   Eyes: Conjunctiva pink, no icterus. Mouth: Oropharyngeal mucosa moist and pink , no lesions erythema or exudate. Neck: Supple without thyromegaly, masses, or lymphadenopathy.  Lungs: Clear to auscultation bilaterally.  Heart: Regular rate and rhythm, no murmurs rubs or gallops.  Abdomen: Bowel sounds are normal, nontender, nondistended, no hepatosplenomegaly or masses, no abdominal bruits or    hernia , no rebound or guarding.   Rectal: Defer to time of colonoscopy.  Extremities: No lower extremity edema. No clubbing or deformities.  Neuro: Alert and oriented x 4 , grossly normal neurologically.  Skin: Warm and dry, no rash or jaundice.   Psych: Alert and cooperative, normal mood and affect.  Labs: Lab Results  Component Value Date   WBC 6.1 01/23/2012   HGB 11.4* 01/23/2012   HCT 37.0 01/23/2012   MCV 82.2 01/23/2012   PLT 237 01/23/2012   Lab Results  Component Value Date   ALT 19 12/03/2011   AST 18 12/03/2011   ALKPHOS 58 12/03/2011   BILITOT 0.2* 12/03/2011   Lab Results  Component Value Date   CREATININE 0.67 01/23/2012   BUN 11 01/23/2012   NA 138 01/23/2012   K 3.6  01/23/2012   CL 98 01/23/2012   CO2 29 01/23/2012     Imaging Studies: No results found.    

## 2012-01-24 NOTE — Assessment & Plan Note (Signed)
H/O chronic diarrhea with recent change in bowels. Check for celiac. TTG.

## 2012-01-28 ENCOUNTER — Encounter (HOSPITAL_COMMUNITY): Payer: Self-pay | Admitting: Pharmacy Technician

## 2012-01-28 MED ORDER — SODIUM CHLORIDE 0.45 % IV SOLN
Freq: Once | INTRAVENOUS | Status: AC
  Administered 2012-01-29: 1000 mL via INTRAVENOUS

## 2012-01-28 NOTE — Progress Notes (Signed)
Quick Note:  Celiac screen negative. Await procedures. ______

## 2012-01-29 ENCOUNTER — Encounter (HOSPITAL_COMMUNITY)
Admission: RE | Disposition: A | Discharge status: Home / Self Care | Payer: Self-pay | Source: Ambulatory Visit | Attending: Gastroenterology

## 2012-01-29 ENCOUNTER — Encounter (HOSPITAL_COMMUNITY): Payer: Self-pay | Admitting: *Deleted

## 2012-01-29 ENCOUNTER — Ambulatory Visit (HOSPITAL_COMMUNITY)
Admission: RE | Admit: 2012-01-29 | Discharge: 2012-01-29 | Disposition: A | Discharge status: Home / Self Care | Payer: Medicaid Other | Source: Ambulatory Visit | Attending: Gastroenterology | Admitting: Gastroenterology

## 2012-01-29 DIAGNOSIS — K648 Other hemorrhoids: Secondary | ICD-10-CM

## 2012-01-29 DIAGNOSIS — K529 Noninfective gastroenteritis and colitis, unspecified: Secondary | ICD-10-CM

## 2012-01-29 DIAGNOSIS — Z860100 Personal history of colon polyps, unspecified: Secondary | ICD-10-CM | POA: Insufficient documentation

## 2012-01-29 DIAGNOSIS — R1310 Dysphagia, unspecified: Secondary | ICD-10-CM

## 2012-01-29 DIAGNOSIS — R198 Other specified symptoms and signs involving the digestive system and abdomen: Secondary | ICD-10-CM

## 2012-01-29 DIAGNOSIS — K219 Gastro-esophageal reflux disease without esophagitis: Secondary | ICD-10-CM

## 2012-01-29 DIAGNOSIS — Z01812 Encounter for preprocedural laboratory examination: Secondary | ICD-10-CM | POA: Insufficient documentation

## 2012-01-29 DIAGNOSIS — K2970 Gastritis, unspecified, without bleeding: Secondary | ICD-10-CM

## 2012-01-29 DIAGNOSIS — I10 Essential (primary) hypertension: Secondary | ICD-10-CM | POA: Insufficient documentation

## 2012-01-29 DIAGNOSIS — K2940 Chronic atrophic gastritis without bleeding: Secondary | ICD-10-CM | POA: Insufficient documentation

## 2012-01-29 DIAGNOSIS — K2990 Gastroduodenitis, unspecified, without bleeding: Secondary | ICD-10-CM

## 2012-01-29 DIAGNOSIS — E7800 Pure hypercholesterolemia, unspecified: Secondary | ICD-10-CM | POA: Insufficient documentation

## 2012-01-29 DIAGNOSIS — R1319 Other dysphagia: Secondary | ICD-10-CM

## 2012-01-29 DIAGNOSIS — Z79899 Other long term (current) drug therapy: Secondary | ICD-10-CM | POA: Insufficient documentation

## 2012-01-29 DIAGNOSIS — E119 Type 2 diabetes mellitus without complications: Secondary | ICD-10-CM | POA: Insufficient documentation

## 2012-01-29 DIAGNOSIS — Z800 Family history of malignant neoplasm of digestive organs: Secondary | ICD-10-CM | POA: Insufficient documentation

## 2012-01-29 LAB — GLUCOSE, CAPILLARY: Glucose-Capillary: 124 mg/dL — ABNORMAL HIGH (ref 70–99)

## 2012-01-29 SURGERY — COLONOSCOPY WITH ESOPHAGOGASTRODUODENOSCOPY (EGD)
Anesthesia: Moderate Sedation

## 2012-01-29 MED ORDER — MEPERIDINE HCL 100 MG/ML IJ SOLN
INTRAMUSCULAR | Status: DC | PRN
  Administered 2012-01-29: 25 mg via INTRAVENOUS
  Administered 2012-01-29 (×2): 50 mg via INTRAVENOUS

## 2012-01-29 MED ORDER — PROMETHAZINE HCL 25 MG/ML IJ SOLN
INTRAMUSCULAR | Status: AC
  Filled 2012-01-29: qty 1

## 2012-01-29 MED ORDER — MIDAZOLAM HCL 5 MG/5ML IJ SOLN
INTRAMUSCULAR | Status: DC | PRN
  Administered 2012-01-29: 2 mg via INTRAVENOUS
  Administered 2012-01-29 (×2): 1 mg via INTRAVENOUS
  Administered 2012-01-29: 2 mg via INTRAVENOUS
  Administered 2012-01-29 (×2): 1 mg via INTRAVENOUS

## 2012-01-29 MED ORDER — MINERAL OIL PO OIL
TOPICAL_OIL | ORAL | Status: AC
  Filled 2012-01-29: qty 30

## 2012-01-29 MED ORDER — SODIUM CHLORIDE 0.9 % IJ SOLN
INTRAMUSCULAR | Status: AC
  Filled 2012-01-29: qty 10

## 2012-01-29 MED ORDER — PROMETHAZINE HCL 25 MG/ML IJ SOLN
INTRAMUSCULAR | Status: DC | PRN
  Administered 2012-01-29: 12.5 mg via INTRAVENOUS

## 2012-01-29 MED ORDER — MEPERIDINE HCL 100 MG/ML IJ SOLN
INTRAMUSCULAR | Status: AC
  Filled 2012-01-29: qty 2

## 2012-01-29 MED ORDER — SODIUM CHLORIDE 0.9 % IJ SOLN
INTRAMUSCULAR | Status: AC
  Administered 2012-01-29: 10 mL via INTRAVENOUS
  Filled 2012-01-29: qty 10

## 2012-01-29 MED ORDER — STERILE WATER FOR IRRIGATION IR SOLN
Status: DC | PRN
  Administered 2012-01-29: 13:00:00

## 2012-01-29 MED ORDER — HEPARIN SOD (PORK) LOCK FLUSH 100 UNIT/ML IV SOLN
INTRAVENOUS | Status: AC
  Administered 2012-01-29: 500 [IU] via INTRAVENOUS
  Filled 2012-01-29: qty 5

## 2012-01-29 MED ORDER — BUTAMBEN-TETRACAINE-BENZOCAINE 2-2-14 % EX AERO
INHALATION_SPRAY | CUTANEOUS | Status: DC | PRN
  Administered 2012-01-29: 2 via TOPICAL

## 2012-01-29 MED ORDER — MIDAZOLAM HCL 5 MG/5ML IJ SOLN
INTRAMUSCULAR | Status: AC
  Filled 2012-01-29: qty 10

## 2012-01-29 NOTE — Op Note (Signed)
[facility] [address] [place], [place]  [address]  DR. FIELD'S EGDdTCS PROCEDURE REPORT  PATIENT:  [name], [name]  MR#:  [id number] BIRTHDATE:  July 27, 1966, 45 yrs. old  GENDER:  female  ENDOSCOPIST:  Jonette Eva, MD 1o ONC: DR. Mariel Sleet  ASSISTANT: Referred by:  PROCEDURE DATE:  01/29/2012 PROCEDURE:  EGD with biopsy, EGD with dilatation over guidewire, COLONOSCOPY ASA CLASS: INDICATIONS:  PERSONAL HISTORY OF POLYPS, FAM HX: COLN CA(MOM AGE < 60), CHANGE IN BOWEL HABITS SOLID DYSPHAGIA  MEDICATIONS:   Demerol 125 mg IV, Versed 8 mg IV, promethazine (Phenergan) 12.5 mg IV TOPICAL ANESTHETIC:  Cetacaine Spray  DESCRIPTION OF PROCEDURE:   After the risks benefits and alternatives of the procedure were thoroughly explained, informed consent was obtained.  The EC-3890LI ([id number]) and EG-[id number] ([id number]) endoscope was introduced through the mouth and advanced to the second portion of the duodenum.  The instrument was slowly withdrawn as the mucosa was carefully examined.  Prior to withdrawal of the scope, the guidwire was placed.  The esophagus was dilated successfully.  After administration of sedation and rectal exam, the patient's rectum was intubated and the EC-3890LI ([id number]) and EG-[id number] ([id number]) colonoscope was advanced under direct visualization to the cecum.  The scope was removed slowly by carefully examining the color, texture, anatomy, and integrity mucosa on the way out. The patient was recovered in endoscopy and discharged home in satisfactory condition.  The patient was recovered in endoscopy and discharged home in satisfactory condition.  <<PROCEDUREIMAGES>>  Mild gastritis was found & BIOPSIED VIA COLD FORCEPS.  EMPIRIC Dilation was then performed at the distal esophagus TO ADDRESS PT'S DYSPHAGIA. NL DUODENUM, & COLON. SMALL INTERNAL HEMORRHOIDS.  1) Dilator:  [place] over guidewire  Size(s):  12.8-16 MM Resistance:  minimal  Heme:   none Appearance:  COMPLICATIONS:  None  ENDOSCOPIC IMPRESSION: SMALL INTERNAL HEMORRHOIDS GASTRITIS  RECOMMENDATIONS: CONTINUE PROTONIX AWAIT BIOPSIES TCS IN 5 YEARS DUE TO FAMILY HISTORY HIGH FIBER/LOW FAT DIET DRINK WATER. MIRALX DAILY. OPV IN 3 MOS. CONSIDER LINZESS OR AMITIZA IF CONSTIPATION NOT RESOLVED.  REPEAT EXAM:  No  ______________________________ Jonette Eva, MD  CC:  n. eSIGNED:     at 01/29/2012 03:37 PM  Hedy Jacob, [phone]

## 2012-01-29 NOTE — Progress Notes (Signed)
Accu check 124

## 2012-01-29 NOTE — Discharge Instructions (Signed)
You have internal hemorrhoids. I dilated your esophagus TO ADDRESS YOUR PROBLEM SWALLOWING. You have mild gastritis, most likely due to your use of ASPIRIN AND NAPROXEN. I biopsied your stomach.  CONTINUE PROTONIX EVERY MORNING. AVOID ITEMS THAT TRIGGER GASTRITIS. HOLD ASPIRIN AND NAPROXEN FOR 2 WEEKS. FOLLOW A HIGH FIBER/LOW FAT DIET. AVOID ITEMS THAT CAUSE BLOATING. SEE INFO BELOW. DRINK WATER TO KEEP URINE LIGHT YELLOW. TAKE MIRALAX DAILY FOR CONSTIPATION. LOSE 10-20 LBS.  BEING OBESE INCREASES YOUR RISK FOR GETTING COLON CANCER. Next colonoscopy in 5 years.   ENDOSCOPY Care After Read the instructions outlined below and refer to this sheet in the next week. These discharge instructions provide you with general information on caring for yourself after you leave the hospital. While your treatment has been planned according to the most current medical practices available, unavoidable complications occasionally occur. If you have any problems or questions after discharge, call DR. Jeniel Slauson, [phone].  ACTIVITY  You may resume your regular activity, but move at a slower pace for the next 24 hours.   Take frequent rest periods for the next 24 hours.   Walking will help get rid of the air and reduce the bloated feeling in your belly (abdomen).   No driving for 24 hours (because of the medicine (anesthesia) used during the test).   You may shower.   Do not sign any important legal documents or operate any machinery for 24 hours (because of the anesthesia used during the test).    NUTRITION  Drink plenty of fluids.   You may resume your normal diet as instructed by your doctor.   Begin with a light meal and progress to your normal diet. Heavy or fried foods are harder to digest and may make you feel sick to your stomach (nauseated).   Avoid alcoholic beverages for 24 hours or as instructed.    MEDICATIONS  You may resume your normal medications.   WHAT YOU CAN EXPECT  TODAY  Some feelings of bloating in the abdomen.   Passage of more gas than usual.   Spotting of blood in your stool or on the toilet paper  .  IF YOU HAD POLYPS REMOVED DURING THE ENDOSCOPY:  Eat a soft diet IF YOU HAVE NAUSEA, BLOATING, ABDOMINAL PAIN, OR VOMITING.    FINDING OUT THE RESULTS OF YOUR TEST Not all test results are available during your visit. DR. Darrick Penna WILL CALL YOU WITHIN 7 DAYS OF YOUR PROCEDUE WITH YOUR RESULTS. Do not assume everything is normal if you have not heard from DR. Margarine Grosshans IN ONE WEEK, CALL HER OFFICE AT [phone].  SEEK IMMEDIATE MEDICAL ATTENTION AND CALL THE OFFICE: [phone] IF:  You have more than a spotting of blood in your stool.   Your belly is swollen (abdominal distention).   You are nauseated or vomiting.   You have a temperature over 101F.   You have abdominal pain or discomfort that is severe or gets worse throughout the day.   Gastritis  Gastritis is an inflammation (the body's way of reacting to injury and/or infection) of the stomach. It is often caused by viral or bacterial (germ) infections. It can also be caused BY ASPIRIN, BC/GOODY POWDER'S, (IBUPROFEN) MOTRIN, OR ALEVE (NAPROXEN), chemicals (including alcohol), SPICY FOODS, and medications. This illness may be associated with generalized malaise (feeling tired, not well), UPPER ABDOMINAL STOMACH cramps, and fever. One common bacterial cause of gastritis is an organism known as H. Pylori. This can be treated with antibiotics.  High-Fiber Diet A high-fiber diet changes your normal diet to include more whole grains, legumes, fruits, and vegetables. Changes in the diet involve replacing refined carbohydrates with unrefined foods. The calorie level of the diet is essentially unchanged. The Dietary Reference Intake (recommended amount) for adult males is 38 grams per day. For adult females, it is 25 grams per day. Pregnant and lactating women should consume 28 grams of fiber  per day. Fiber is the intact part of a plant that is not broken down during digestion. Functional fiber is fiber that has been isolated from the plant to provide a beneficial effect in the body. PURPOSE  Increase stool bulk.   Ease and regulate bowel movements.   Lower cholesterol.  INDICATIONS THAT YOU NEED MORE FIBER  Constipation and hemorrhoids.   Uncomplicated diverticulosis (intestine condition) and irritable bowel syndrome.   Weight management.   As a protective measure against hardening of the arteries (atherosclerosis), diabetes, and cancer.   GUIDELINES FOR INCREASING FIBER IN THE DIET  Start adding fiber to the diet slowly. A gradual increase of about 5 more grams (2 slices of whole-wheat bread, 2 servings of most fruits or vegetables, or 1 bowl of high-fiber cereal) per day is best. Too rapid an increase in fiber may result in constipation, flatulence, and bloating.   Drink enough water and fluids to keep your urine clear or pale yellow. Water, juice, or caffeine-free drinks are recommended. Not drinking enough fluid may cause constipation.   Eat a variety of high-fiber foods rather than one type of fiber.   Try to increase your intake of fiber through using high-fiber foods rather than fiber pills or supplements that contain small amounts of fiber.   The goal is to change the types of food eaten. Do not supplement your present diet with high-fiber foods, but replace foods in your present diet.  INCLUDE A VARIETY OF FIBER SOURCES  Replace refined and processed grains with whole grains, canned fruits with fresh fruits, and incorporate other fiber sources. White rice, white breads, and most bakery goods contain little or no fiber.   Brown whole-grain rice, buckwheat oats, and many fruits and vegetables are all good sources of fiber. These include: broccoli, Brussels sprouts, cabbage, cauliflower, beets, sweet potatoes, white potatoes (skin on), carrots, tomatoes, eggplant,  squash, berries, fresh fruits, and dried fruits.   Cereals appear to be the richest source of fiber. Cereal fiber is found in whole grains and bran. Bran is the fiber-rich outer coat of cereal grain, which is largely removed in refining. In whole-grain cereals, the bran remains. In breakfast cereals, the largest amount of fiber is found in those with "bran" in their names. The fiber content is sometimes indicated on the label.   You may need to include additional fruits and vegetables each day.   In baking, for 1 cup white flour, you may use the following substitutions:   1 cup whole-wheat flour minus 2 tablespoons.   1/2 cup white flour plus 1/2 cup whole-wheat flour.   Low-Fat Diet BREADS, CEREALS, PASTA, RICE, DRIED PEAS, AND BEANS These products are high in carbohydrates and most are low in fat. Therefore, they can be increased in the diet as substitutes for fatty foods. They too, however, contain calories and should not be eaten in excess. Cereals can be eaten for snacks as well as for breakfast.  Include foods that contain fiber (fruits, vegetables, whole grains, and legumes). Research shows that fiber may lower blood cholesterol levels, especially the  water-soluble fiber found in fruits, vegetables, oat products, and legumes. FRUITS AND VEGETABLES It is good to eat fruits and vegetables. Besides being sources of fiber, both are rich in vitamins and some minerals. They help you get the daily allowances of these nutrients. Fruits and vegetables can be used for snacks and desserts. MEATS Limit lean meat, chicken, [place], and fish to no more than 6 ounces per day. Beef, Pork, and Lamb Use lean cuts of beef, pork, and lamb. Lean cuts include:  Extra-lean ground beef.  Arm roast.  Sirloin tip.  Center-cut ham.  Round steak.  Loin chops.  Rump roast.  Tenderloin.  Trim all fat off the outside of meats before cooking. It is not necessary to severely decrease the intake of red meat, but  lean choices should be made. Lean meat is rich in protein and contains a highly absorbable form of iron. Premenopausal women, in particular, should avoid reducing lean red meat because this could increase the risk for low red blood cells (iron-deficiency anemia). The organ meats, such as liver, sweetbreads, kidneys, and brain are very rich in cholesterol. They should be limited. Chicken and [place] These are good sources of protein. The fat of poultry can be reduced by removing the skin and underlying fat layers before cooking. Chicken and [place] can be substituted for lean red meat in the diet. Poultry should not be fried or covered with high-fat sauces. Fish and Shellfish Fish is a good source of protein. Shellfish contain cholesterol, but they usually are low in saturated fatty acids. The preparation of fish is important. Like chicken and [place], they should not be fried or covered with high-fat sauces. EGGS Egg whites contain no fat or cholesterol. They can be eaten often. Try 1 to 2 egg whites instead of whole eggs in recipes or use egg substitutes that do not contain yolk. MILK AND DAIRY PRODUCTS Use skim or 1% milk instead of 2% or whole milk. Decrease whole milk, natural, and processed cheeses. Use nonfat or low-fat (2%) cottage cheese or low-fat cheeses made from vegetable oils. Choose nonfat or low-fat (1 to 2%) yogurt. Experiment with evaporated skim milk in recipes that call for heavy cream. Substitute low-fat yogurt or low-fat cottage cheese for sour cream in dips and salad dressings. Have at least 2 servings of low-fat dairy products, such as 2 glasses of skim (or 1%) milk each day to help get your daily calcium intake.  FATS AND OILS Reduce the total intake of fats, especially saturated fat. Butterfat, lard, and beef fats are high in saturated fat and cholesterol. These should be avoided as much as possible. Vegetable fats do not contain cholesterol, but certain vegetable fats, such as  coconut oil, palm oil, and palm kernel oil are very high in saturated fats. These should be limited. These fats are often used in bakery goods, processed foods, popcorn, oils, and nondairy creamers. Vegetable shortenings and some peanut butters contain hydrogenated oils, which are also saturated fats. Read the labels on these foods and check for saturated vegetable oils. Unsaturated vegetable oils and fats do not raise blood cholesterol. However, they should be limited because they are fats and are high in calories. Total fat should still be limited to 30% of your daily caloric intake. Desirable liquid vegetable oils are corn oil, cottonseed oil, olive oil, canola oil, safflower oil, soybean oil, and sunflower oil. Peanut oil is not as good, but small amounts are acceptable. Buy a heart-healthy tub margarine that has no partially  hydrogenated oils in the ingredients. Mayonnaise and salad dressings often are made from unsaturated fats, but they should also be limited because of their high calorie and fat content. Seeds, nuts, peanut butter, olives, and avocados are high in fat, but the fat is mainly the unsaturated type. These foods should be limited mainly to avoid excess calories and fat. OTHER EATING TIPS Snacks  Most sweets should be limited as snacks. They tend to be rich in calories and fats, and their caloric content outweighs their nutritional value. Some good choices in snacks are graham crackers, melba toast, soda crackers, bagels (no egg), English muffins, fruits, and vegetables. These snacks are preferable to snack crackers, [place] fries, and chips. Popcorn should be air-popped or cooked in small amounts of liquid vegetable oil. Desserts Eat fruit, low-fat yogurt, and fruit ices. AVOID pastries, cake, and cookies. Sherbet, angel food cake, gelatin dessert, frozen low-fat yogurt, or other frozen products that do not contain saturated fat (pure fruit juice bars, frozen ice pops) are also acceptable.    COOKING METHODS Choose those methods that use little or no fat. They include: Poaching.  Braising.  Steaming.  Grilling.  Baking.  Stir-frying.  Broiling.  Microwaving.  Foods can be cooked in a nonstick pan without added fat, or use a nonfat cooking spray in regular cookware. Limit fried foods and avoid frying in saturated fat. Add moisture to lean meats by using water, broth, cooking wines, and other nonfat or low-fat sauces along with the cooking methods mentioned above. Soups and stews should be chilled after cooking. The fat that forms on top after a few hours in the refrigerator should be skimmed off. When preparing meals, avoid using excess salt. Salt can contribute to raising blood pressure in some people. EATING AWAY FROM HOME Order entres, potatoes, and vegetables without sauces or butter. When meat exceeds the size of a deck of cards (3 to 4 ounces), the rest can be taken home for another meal. Choose vegetable or fruit salads and ask for low-calorie salad dressings to be served on the side. Use dressings sparingly. Limit high-fat toppings, such as bacon, crumbled eggs, cheese, sunflower seeds, and olives. Ask for heart-healthy tub margarine instead of butter.  Hemorrhoids Hemorrhoids are dilated (enlarged) veins around the rectum. Sometimes clots will form in the veins. This makes them swollen and painful. These are called thrombosed hemorrhoids. Causes of hemorrhoids include:  Constipation.   Straining to have a bowel movement.   HEAVY LIFTING HOME CARE INSTRUCTIONS  Eat a well balanced diet and drink 6 to 8 glasses of water every day to avoid constipation. You may also use a bulk laxative.   Avoid straining to have bowel movements.   Keep anal area dry and clean.   Do not use a donut shaped pillow or sit on the toilet for long periods. This increases blood pooling and pain.   Move your bowels when your body has the urge; this will require less straining and will  decrease pain and pressure.

## 2012-01-29 NOTE — H&P (View-Only) (Signed)
Primary Care Physician:  [name], MD, MD Referring physician: Dr. Glenford Peers Primary Gastroenterologist:  Jonette Eva, MD   Chief Complaint  Patient presents with  . Colonoscopy    HPI:  [name] is a 46 y.o. female here for surveillance colonoscopy at the request of Dr. Mariel Sleet. Last TCS in 2008, tubular adenoma. FH CRC, mother in her mid-50s, died with metastatic disease. Patient diagnosed with Stage 2 breast cancer one year ago. S/P lumpectomy, chemo, XRT.  Last several weeks, switched from diarrhea to constipation. No blood in stool since on chemo. Some intermittent rectal pain. Some abdominal cramps. Lots of gas. No vomiting. Heartburn was doing well on Nexium. Medicaid switched to pantoprazole for about two months, now with breakthrough symptoms throughout day. Solid food dysphagia. XRT done right axilla/right chest.   Current Outpatient Prescriptions  Medication Sig Dispense Refill  . fish oil-omega-3 fatty acids 1000 MG capsule Take 2 g by mouth daily. 1000mg  cap.        . gabapentin (NEURONTIN) 300 MG capsule Take 1 capsule (300 mg total) by mouth 3 (three) times daily.  100 capsule  3  . hydrochlorothiazide (MICROZIDE) 12.5 MG capsule Take 1 capsule (12.5 mg total) by mouth daily. 25mg   1/2 tab daily  30 capsule  2  . metFORMIN (GLUMETZA) 500 MG (MOD) 24 hr tablet Take 500 mg by mouth 2 (two) times daily.        . metoprolol succinate (TOPROL-XL) 25 MG 24 hr tablet Take 25 mg by mouth daily.        . naproxen (NAPROSYN) 500 MG tablet Take 1 tablet (500 mg total) by mouth 3 (three) times daily with meals.  45 tablet  0  . pantoprazole (PROTONIX) 40 MG tablet Take 40 mg by mouth daily.      Marland Kitchen PARoxetine (PAXIL CR) 37.5 MG 24 hr tablet Take 37.5 mg by mouth every morning.        . potassium chloride (KCL) 2 mEq/mL SOLN oral liquid Take 10 mL BID for 21 days, then 10 mL daily, PO  300 mL  1  . rosuvastatin (CRESTOR) 10 MG tablet Take 10 mg by mouth daily.             Allergies as of 01/24/2012  . (No Known Allergies)    Past Medical History  Diagnosis Date  . Diabetes mellitus   . Hypertension   . Breast cancer 05/22/2011    03/01/11  . Depression 06/22/2011  . GERD (gastroesophageal reflux disease) 06/22/2011  . DM (diabetes mellitus) 06/22/2011  . Hypercholesterolemia 06/22/2011  . Hypertension 06/22/2011  . Obesity 06/22/2011  . Hx of adenomatous colonic polyps 10/2007    4mm sigmoid tubular adenoma, FH colon cancer, mother in mid-50s  . Anxiety   . Depression     Past Surgical History  Procedure Date  . Back surg x2   . Cholecystectomy   . Mm breast stereo bx*l*r/s     rt.  . Mastectomy partial / lumpectomy     rt.  . Colonoscopy 11/03/07    4-mm sessile polyp removed/small internal hemorrhoids/tubular adenoma, random colon bx negative for microscopic colitis  . Portacath placement     Family History  Problem Relation Age of Onset  . Colon cancer Mother     mid-50s, died of metastatic disease  . Coronary artery disease Father   . Diabetes Father   . Bipolar disorder Sister     History   Social History  .  Marital Status: Legally Separated    Spouse Name: N/A    Number of Children: 3  . Years of Education: N/A   Occupational History  . Daycare worker    Social History Main Topics  . Smoking status: Never Smoker   . Smokeless tobacco: Not on file  . Alcohol Use: No  . Drug Use: No  . Sexually Active: Not on file   Other Topics Concern  . Not on file   Social History Narrative  . No narrative on file      ROS:  General: Negative for anorexia, weight loss, fever, chills, fatigue, weakness. Eyes: Negative for vision changes.  ENT: Negative for hoarseness, difficulty swallowing , nasal congestion. CV: Negative for chest pain, angina, palpitations, dyspnea on exertion, peripheral edema.  Respiratory: Negative for dyspnea at rest, dyspnea on exertion, cough, sputum, wheezing.  GI: See history of present  illness. GU:  Negative for dysuria, hematuria, urinary incontinence, urinary frequency, nocturnal urination.  MS: Negative for joint pain, low back pain.  Derm: Negative for rash or itching.  Neuro: Negative for weakness, seizure, frequent headaches, memory loss, confusion. Numbness/pain in upper extremities since chemo. Psych: Negative for anxiety, depression, suicidal ideation, hallucinations.  Endo: Negative for unusual weight change.  Heme: Negative for bruising or bleeding. Allergy: Negative for rash or hives.    Physical Examination:  BP 113/80  Pulse 86  Temp(Src) 97.3 F (36.3 C) (Temporal)  Ht 5\' 4"  (1.626 m)  Wt 258 lb (117.028 kg)  BMI 44.29 kg/m2  LMP 06/26/2011   General: Well-nourished, well-developed in no acute distress. Obese. Accompanied by boyfriend. Head: Normocephalic, atraumatic.   Eyes: Conjunctiva pink, no icterus. Mouth: Oropharyngeal mucosa moist and pink , no lesions erythema or exudate. Neck: Supple without thyromegaly, masses, or lymphadenopathy.  Lungs: Clear to auscultation bilaterally.  Heart: Regular rate and rhythm, no murmurs rubs or gallops.  Abdomen: Bowel sounds are normal, nontender, nondistended, no hepatosplenomegaly or masses, no abdominal bruits or    hernia , no rebound or guarding.   Rectal: Defer to time of colonoscopy.  Extremities: No lower extremity edema. No clubbing or deformities.  Neuro: Alert and oriented x 4 , grossly normal neurologically.  Skin: Warm and dry, no rash or jaundice.   Psych: Alert and cooperative, normal mood and affect.  Labs: Lab Results  Component Value Date   WBC 6.1 01/23/2012   HGB 11.4* 01/23/2012   HCT 37.0 01/23/2012   MCV 82.2 01/23/2012   PLT 237 01/23/2012   Lab Results  Component Value Date   ALT 19 12/03/2011   AST 18 12/03/2011   ALKPHOS 58 12/03/2011   BILITOT 0.2* 12/03/2011   Lab Results  Component Value Date   CREATININE 0.67 01/23/2012   BUN 11 01/23/2012   NA 138 01/23/2012   K 3.6  01/23/2012   CL 98 01/23/2012   CO2 29 01/23/2012     Imaging Studies: No results found.

## 2012-01-29 NOTE — Interval H&P Note (Signed)
History and Physical Interval Note:  01/29/2012 1:37 PM  [name]  has presented today for surgery, with the diagnosis of Change in bowels, hx of polyps  The various methods of treatment have been discussed with the patient and family. After consideration of risks, benefits and other options for treatment, the patient has consented to  Procedure(s) (LRB): COLONOSCOPY WITH ESOPHAGOGASTRODUODENOSCOPY (EGD) (N/A) as a surgical intervention .  The patients' history has been reviewed, patient examined, no change in status, stable for surgery.  I have reviewed the patients' chart and labs.  Questions were answered to the patient's satisfaction.     [employer]

## 2012-01-29 NOTE — Progress Notes (Signed)
Quick Note:  Pt returned call and was informed. ______ 

## 2012-01-29 NOTE — Progress Notes (Signed)
Quick Note:  LMOM to call. ______ 

## 2012-02-05 ENCOUNTER — Telehealth: Payer: Self-pay | Admitting: Gastroenterology

## 2012-02-05 NOTE — Telephone Encounter (Signed)
Please call pt. HER stomach Bx shows mild gastritis. Continue PROTONIX 30 MINUTES PRIOR TO HER FIRST MEAL EVERY MORNING.   AVOID ITEMS THAT TRIGGER GASTRITIS. HOLD ASPIRIN AND NAPROXEN UNTIL MAR 30. FOLLOW A HIGH FIBER/LOW FAT DIET. AVOID ITEMS THAT CAUSE BLOATING. DRINK WATER TO KEEP URINE LIGHT YELLOW. TAKE MIRALAX DAILY FOR CONSTIPATION. LOSE 10-20 LBS.   OPV IN 3 MOS. Next colonoscopy in 5 years.

## 2012-02-05 NOTE — Telephone Encounter (Signed)
Pt returned call and was informed of results.  

## 2012-02-05 NOTE — Telephone Encounter (Signed)
Results Cc an reminder is in the computer

## 2012-02-05 NOTE — Telephone Encounter (Signed)
LMOM to call.

## 2012-02-11 ENCOUNTER — Other Ambulatory Visit (HOSPITAL_COMMUNITY): Payer: Self-pay

## 2012-02-11 ENCOUNTER — Telehealth (HOSPITAL_COMMUNITY): Payer: Self-pay | Admitting: Oncology

## 2012-02-11 ENCOUNTER — Telehealth: Payer: Self-pay | Admitting: Gastroenterology

## 2012-02-11 DIAGNOSIS — I10 Essential (primary) hypertension: Secondary | ICD-10-CM

## 2012-02-11 MED ORDER — METOPROLOL SUCCINATE ER 25 MG PO TB24: 25.0000 mg | ORAL_TABLET | Freq: Every day | ORAL | Status: DC

## 2012-02-11 NOTE — Telephone Encounter (Signed)
Called and informed pt. She said she is having a lot of nausea. Rx for phenergan called to [facility] and LMOM.

## 2012-02-11 NOTE — Telephone Encounter (Signed)
Patient was diagnosed with gastritis shes feeling nauseated and has diarrhea and shes asking for something she uses [facility] in [place], Please advise??

## 2012-02-11 NOTE — Telephone Encounter (Addendum)
Call pt. She should stop taking Miralax if she's having diarrhea. IF SHE IS HAVING NAUSEA, FOLLOW A FULL LIQUID DIET. SHE MAY STOP BY OFFFICE TO PICK UPA  DIET HO. She she may have Phenergan 12.5 mg tabs 1-2 po q6h prn for nausea #25, rfx0.

## 2012-02-11 NOTE — Telephone Encounter (Signed)
Called pt. She said she had diarrhea 4-5 x yesterday. None today. She is just feeling very nauseated today. She had roast, baked potato and toss salad yesterday. This AM she only ate a few crackers because of the nausea. Please advise!

## 2012-02-15 ENCOUNTER — Encounter (HOSPITAL_COMMUNITY): Payer: Self-pay | Admitting: *Deleted

## 2012-02-15 ENCOUNTER — Emergency Department (HOSPITAL_COMMUNITY)
Admission: EM | Admit: 2012-02-15 | Discharge: 2012-02-16 | Disposition: A | Discharge status: Home / Self Care | Payer: Medicaid Other | Attending: Emergency Medicine | Admitting: Emergency Medicine

## 2012-02-15 DIAGNOSIS — S61219A Laceration without foreign body of unspecified finger without damage to nail, initial encounter: Secondary | ICD-10-CM

## 2012-02-15 DIAGNOSIS — K219 Gastro-esophageal reflux disease without esophagitis: Secondary | ICD-10-CM | POA: Insufficient documentation

## 2012-02-15 DIAGNOSIS — S62639A Displaced fracture of distal phalanx of unspecified finger, initial encounter for closed fracture: Secondary | ICD-10-CM

## 2012-02-15 DIAGNOSIS — E669 Obesity, unspecified: Secondary | ICD-10-CM | POA: Insufficient documentation

## 2012-02-15 DIAGNOSIS — I10 Essential (primary) hypertension: Secondary | ICD-10-CM | POA: Insufficient documentation

## 2012-02-15 DIAGNOSIS — E119 Type 2 diabetes mellitus without complications: Secondary | ICD-10-CM | POA: Insufficient documentation

## 2012-02-15 DIAGNOSIS — S61209A Unspecified open wound of unspecified finger without damage to nail, initial encounter: Secondary | ICD-10-CM | POA: Insufficient documentation

## 2012-02-15 DIAGNOSIS — M79609 Pain in unspecified limb: Secondary | ICD-10-CM | POA: Insufficient documentation

## 2012-02-15 DIAGNOSIS — E7800 Pure hypercholesterolemia, unspecified: Secondary | ICD-10-CM | POA: Insufficient documentation

## 2012-02-15 DIAGNOSIS — W230XXA Caught, crushed, jammed, or pinched between moving objects, initial encounter: Secondary | ICD-10-CM | POA: Insufficient documentation
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
## 2012-02-15 NOTE — Progress Notes (Signed)
REVIEWED.  

## 2012-02-16 ENCOUNTER — Emergency Department (HOSPITAL_COMMUNITY): Payer: Medicaid Other

## 2012-02-16 MED ORDER — OXYCODONE-ACETAMINOPHEN 5-325 MG PO TABS
2.0000 | ORAL_TABLET | Freq: Once | ORAL | Status: AC
  Administered 2012-02-16: 2 via ORAL
  Filled 2012-02-16: qty 2

## 2012-02-16 NOTE — ED Provider Notes (Signed)
History     CSN: [id number]  Arrival date & time 02/15/12  2333   First MD Initiated Contact with Patient 02/16/12 0006      Chief Complaint  Patient presents with  . Laceration    The history is provided by the patient.   the patient reports that her left index finger was slammed in a door tonight.  She reports pain in the distal phalanx of her left index finger with an associated laceration.  She has no bleeding at this time.  She reports pain with palpation and movement.  Her pain is improved by rest and not moving her finger.  Her pain is moderate at this time.  She has no other injuries.  Her tetanus is up-to-date.  Past Medical History  Diagnosis Date  . Breast cancer 05/22/2011    03/01/11, Stage 2, s/p lumpectomy, chemo/xrt  . Depression 06/22/2011  . GERD (gastroesophageal reflux disease) 06/22/2011  . DM (diabetes mellitus) 06/22/2011  . Hypercholesterolemia 06/22/2011  . Hypertension 06/22/2011  . Obesity 06/22/2011  . Hx of adenomatous colonic polyps 10/2007    4mm sigmoid tubular adenoma, FH colon cancer, mother in mid-50s  . Anxiety     Past Surgical History  Procedure Date  . Back surg x2   . Cholecystectomy   . Mm breast stereo bx*l*r/s     rt.  . Mastectomy partial / lumpectomy     rt.  . Colonoscopy 11/03/07    4-mm sessile polyp removed/small internal hemorrhoids/tubular adenoma, random colon bx negative for microscopic colitis  . Portacath placement     Family History  Problem Relation Age of Onset  . Colon cancer Mother     mid-50s, died of metastatic disease  . Cancer Mother   . Coronary artery disease Mother   . Diabetes type I Mother   . Coronary artery disease Father   . Diabetes Father   . Diabetes type I Father   . Hypertension Father   . Bipolar disorder Sister   . Coronary artery disease Brother   . Hypertension Brother     History  Substance Use Topics  . Smoking status: Never Smoker   . Smokeless tobacco: Not on file  . Alcohol Use: No      OB History    Grav Para Term Preterm Abortions TAB SAB Ect Mult Living                  Review of Systems  All other systems reviewed and are negative.    Allergies  Review of patient's allergies indicates no known allergies.  Home Medications   Current Outpatient Rx  Name Route Sig Dispense Refill  . OMEGA-3 FATTY ACIDS 1000 MG PO CAPS Oral Take 1 g by mouth daily.     Marland Kitchen HYDROCHLOROTHIAZIDE 12.5 MG PO CAPS Oral Take 12.5 mg by mouth daily.    Marland Kitchen METFORMIN HCL 500 MG PO TABS Oral Take 500 mg by mouth 2 (two) times daily.    Marland Kitchen METOPROLOL SUCCINATE ER 25 MG PO TB24 Oral Take 1 tablet (25 mg total) by mouth daily. 30 tablet 6  . NAPROXEN 500 MG PO TABS Oral Take 500 mg by mouth 3 (three) times daily as needed. For pain    . PANTOPRAZOLE SODIUM 40 MG PO TBEC Oral Take 40 mg by mouth daily.    Marland Kitchen PAROXETINE HCL ER 37.5 MG PO TB24 Oral Take 37.5 mg by mouth every morning.      Marland Kitchen ROSUVASTATIN  CALCIUM 10 MG PO TABS Oral Take 10 mg by mouth daily.        BP 123/88  Pulse 96  Temp(Src) 98.6 F (37 C) (Oral)  Resp 20  Ht 5\' 9"  (1.753 m)  Wt 245 lb (111.131 kg)  BMI 36.18 kg/m2  SpO2 98%  LMP 06/26/2011  Physical Exam  Constitutional: She is oriented to person, place, and time. She appears well-developed and well-nourished.  HENT:  Head: Normocephalic.  Eyes: EOM are normal.  Neck: Normal range of motion.  Pulmonary/Chest: Effort normal.  Musculoskeletal: Normal range of motion.       Left index finger with small avulsion of the skin.  This is very superficial.  There is no active bleeding.  She has pain of her distal phalanx of her left index finger.  She has normal flexion at the DIP joint  Neurological: She is alert and oriented to person, place, and time.  Psychiatric: She has a normal mood and affect.    ED Course  Procedures (including critical care time)  LACERATION REPAIR Performed by: Lyanne Co Consent: Verbal consent obtained. Risks and benefits:  risks, benefits and alternatives were discussed Patient identity confirmed: provided demographic data Time out performed prior to procedure Wound cleansed with tap water Wound explored Laceration Location: Distal phalanx of left index finger Laceration Length: 1 cm No Foreign Bodies seen or palpated Anesthesia: none Local anesthetic: none Anesthetic total: 0 ml Irrigation method: syringe Amount of cleaning: standard Skin closure: dermabond Number of sutures or staples: 0 Technique: dermabond Patient tolerance: Patient tolerated the procedure well with no immediate complications.   Labs Reviewed - No data to display Dg Finger Index Left  02/16/2012  *RADIOLOGY REPORT*  Clinical Data: Finger laceration.  LEFT INDEX FINGER 2+V  Comparison: None  Findings: Soft tissue swelling over the distal aspect of the left index finger.  Small bone fragment noted posteriorly at the base of the distal phalanx, likely small avulsed fragment.  No subluxation or dislocation.  No radiopaque foreign bodies.  IMPRESSION: Probable small fracture fragment off the posterior base of the left index finger distal phalanx.  Original Report Authenticated By: Cyndie Chime, M.D.     1. Fracture of finger, distal phalanx, left, closed   2. Laceration of finger       MDM  Superficial laceration of left index finger.  Was repaired with Dermabond.  Buddy tape the fingers her distal phalanx fracture        Lyanne Co, MD 02/17/12 [phone]

## 2012-02-18 ENCOUNTER — Encounter: Payer: Self-pay | Admitting: Gastroenterology

## 2012-03-05 ENCOUNTER — Encounter (HOSPITAL_COMMUNITY): Payer: Medicaid Other

## 2012-03-11 ENCOUNTER — Encounter (HOSPITAL_COMMUNITY): Payer: Medicaid Other

## 2012-03-13 ENCOUNTER — Encounter (HOSPITAL_COMMUNITY): Payer: Medicaid Other | Attending: Oncology

## 2012-03-13 DIAGNOSIS — T50904A Poisoning by unspecified drugs, medicaments and biological substances, undetermined, initial encounter: Secondary | ICD-10-CM | POA: Insufficient documentation

## 2012-03-13 DIAGNOSIS — C50919 Malignant neoplasm of unspecified site of unspecified female breast: Secondary | ICD-10-CM

## 2012-03-13 DIAGNOSIS — C50119 Malignant neoplasm of central portion of unspecified female breast: Secondary | ICD-10-CM

## 2012-03-13 DIAGNOSIS — Z452 Encounter for adjustment and management of vascular access device: Secondary | ICD-10-CM

## 2012-03-13 MED ORDER — SODIUM CHLORIDE 0.9 % IJ SOLN
INTRAMUSCULAR | Status: AC
  Filled 2012-03-13: qty 10

## 2012-03-13 MED ORDER — HEPARIN SOD (PORK) LOCK FLUSH 100 UNIT/ML IV SOLN
500.0000 [IU] | Freq: Once | INTRAVENOUS | Status: AC
  Administered 2012-03-13: 500 [IU] via INTRAVENOUS
  Filled 2012-03-13: qty 5

## 2012-03-13 MED ORDER — HEPARIN SOD (PORK) LOCK FLUSH 100 UNIT/ML IV SOLN
INTRAVENOUS | Status: AC
  Filled 2012-03-13: qty 5

## 2012-03-13 MED ORDER — SODIUM CHLORIDE 0.9 % IJ SOLN
20.0000 mL | INTRAMUSCULAR | Status: DC | PRN
  Administered 2012-03-13: 20 mL via INTRAVENOUS
  Filled 2012-03-13: qty 20

## 2012-03-13 NOTE — Progress Notes (Signed)
[name] presented for Portacath access and flush. Proper placement of portacath confirmed by CXR. Portacath located left chest wall accessed with  H 20 needle. Good blood return present. Portacath flushed with 20ml NS and 500U/5ml Heparin and needle removed intact. Procedure without incident. Patient tolerated procedure well.   

## 2012-03-14 ENCOUNTER — Encounter (HOSPITAL_COMMUNITY): Payer: Self-pay

## 2012-03-14 ENCOUNTER — Emergency Department (HOSPITAL_COMMUNITY): Payer: Medicaid Other

## 2012-03-14 ENCOUNTER — Emergency Department (HOSPITAL_COMMUNITY)
Admission: EM | Admit: 2012-03-14 | Discharge: 2012-03-15 | Disposition: A | Discharge status: Home / Self Care | Payer: Medicaid Other | Attending: Emergency Medicine | Admitting: Emergency Medicine

## 2012-03-14 DIAGNOSIS — R109 Unspecified abdominal pain: Secondary | ICD-10-CM | POA: Insufficient documentation

## 2012-03-14 DIAGNOSIS — Z853 Personal history of malignant neoplasm of breast: Secondary | ICD-10-CM | POA: Insufficient documentation

## 2012-03-14 DIAGNOSIS — E119 Type 2 diabetes mellitus without complications: Secondary | ICD-10-CM | POA: Insufficient documentation

## 2012-03-14 DIAGNOSIS — I10 Essential (primary) hypertension: Secondary | ICD-10-CM | POA: Insufficient documentation

## 2012-03-14 DIAGNOSIS — R21 Rash and other nonspecific skin eruption: Secondary | ICD-10-CM | POA: Insufficient documentation

## 2012-03-14 LAB — CBC
Hemoglobin: 11 g/dL — ABNORMAL LOW (ref 12.0–15.0)
MCH: 24.4 pg — ABNORMAL LOW (ref 26.0–34.0)
MCHC: 30.6 g/dL (ref 30.0–36.0)
MCV: 79.8 fL (ref 78.0–100.0)
Platelets: 210 10*3/uL (ref 150–400)
RBC: 4.5 MIL/uL (ref 3.87–5.11)
RDW: 17.7 % — ABNORMAL HIGH (ref 11.5–15.5)
WBC: 5.4 10*3/uL (ref 4.0–10.5)

## 2012-03-14 MED ORDER — CLINDAMYCIN PHOSPHATE 600 MG/50ML IV SOLN
600.0000 mg | Freq: Once | INTRAVENOUS | Status: AC
  Administered 2012-03-14: 600 mg via INTRAVENOUS
  Filled 2012-03-14: qty 50

## 2012-03-14 MED ORDER — SODIUM CHLORIDE 0.9 % IV SOLN
INTRAVENOUS | Status: DC
  Administered 2012-03-14: 1000 mL via INTRAVENOUS

## 2012-03-14 NOTE — ED Notes (Signed)
Noted redden area to the left inner thigh.

## 2012-03-14 NOTE — ED Notes (Signed)
Pt presents with large red painful area to left groin and thigh that started today.

## 2012-03-15 LAB — BASIC METABOLIC PANEL
Calcium: 9.3 mg/dL (ref 8.4–10.5)
Creatinine, Ser: 0.68 mg/dL (ref 0.50–1.10)
GFR calc Af Amer: >90 mL/min (ref >90)
GFR calc non Af Amer: >90 mL/min (ref >90)
Glucose, Bld: 111 mg/dL — ABNORMAL HIGH (ref 70–99)
Potassium: 3.2 mEq/L — ABNORMAL LOW (ref 3.5–5.1)
Sodium: 140 mEq/L (ref 135–145)

## 2012-03-15 MED ORDER — KETOROLAC TROMETHAMINE 30 MG/ML IJ SOLN
30.0000 mg | Freq: Once | INTRAMUSCULAR | Status: AC
  Administered 2012-03-15: 30 mg via INTRAVENOUS
  Filled 2012-03-15: qty 1

## 2012-03-15 MED ORDER — POTASSIUM CHLORIDE CRYS ER 20 MEQ PO TBCR
40.0000 meq | EXTENDED_RELEASE_TABLET | Freq: Once | ORAL | Status: AC
  Administered 2012-03-15: 40 meq via ORAL
  Filled 2012-03-15: qty 2

## 2012-03-15 MED ORDER — NYSTATIN 100000 UNIT/GM EX CREA: TOPICAL_CREAM | CUTANEOUS | Status: DC

## 2012-03-15 MED ORDER — CLINDAMYCIN HCL 150 MG PO CAPS: 150.0000 mg | ORAL_CAPSULE | Freq: Four times a day (QID) | ORAL | Status: AC

## 2012-03-15 NOTE — ED Provider Notes (Signed)
History     CSN: [id number]  Arrival date & time 03/14/12  2036   First MD Initiated Contact with Patient 03/14/12 2300      Chief Complaint  Patient presents with  . Rash    (Consider location/radiation/quality/duration/timing/severity/associated sxs/prior treatment) The history is provided by the patient.  left groin rash. Patient noticed this tonight. She is diabetic and states her sugars have been under good control. She denies any difficulty urinating. She denies any fevers or chills. She denies any nausea or vomiting. She's been in her normal state of health otherwise. She denies any trauma or injury to that region. The area is uncomfortable but she denies any severe pain. History of yeast infection.   Past Medical History  Diagnosis Date  . Breast cancer 05/22/2011    03/01/11, Stage 2, s/p lumpectomy, chemo/xrt  . Depression 06/22/2011  . GERD (gastroesophageal reflux disease) 06/22/2011  . DM (diabetes mellitus) 06/22/2011  . Hypercholesterolemia 06/22/2011  . Hypertension 06/22/2011  . Obesity 06/22/2011  . Hx of adenomatous colonic polyps 10/2007    4mm sigmoid tubular adenoma, FH colon cancer, mother in mid-50s  . Anxiety     Past Surgical History  Procedure Date  . Back surg x2   . Cholecystectomy   . Mm breast stereo bx*l*r/s     rt.  . Mastectomy partial / lumpectomy     rt.  . Colonoscopy 11/03/07    4-mm sessile polyp removed/small internal hemorrhoids/tubular adenoma, random colon bx negative for microscopic colitis  . Portacath placement     Family History  Problem Relation Age of Onset  . Colon cancer Mother     mid-50s, died of metastatic disease  . Cancer Mother   . Coronary artery disease Mother   . Diabetes type I Mother   . Coronary artery disease Father   . Diabetes Father   . Diabetes type I Father   . Hypertension Father   . Bipolar disorder Sister   . Coronary artery disease Brother   . Hypertension Brother     History  Substance Use Topics   . Smoking status: Never Smoker   . Smokeless tobacco: Not on file  . Alcohol Use: No    OB History    Grav Para Term Preterm Abortions TAB SAB Ect Mult Living                  Review of Systems  Constitutional: Negative for fever and chills.  HENT: Negative for neck pain and neck stiffness.   Eyes: Negative for pain.  Respiratory: Negative for shortness of breath.   Cardiovascular: Negative for chest pain.  Gastrointestinal: Negative for abdominal pain.  Genitourinary: Negative for dysuria.  Musculoskeletal: Negative for back pain.  Skin: Positive for rash.  Neurological: Negative for headaches.  All other systems reviewed and are negative.    Allergies  Review of patient's allergies indicates no known allergies.  Home Medications   Current Outpatient Rx  Name Route Sig Dispense Refill  . OMEGA-3 FATTY ACIDS 1000 MG PO CAPS Oral Take 1 g by mouth daily.     Marland Kitchen HYDROCHLOROTHIAZIDE 12.5 MG PO CAPS Oral Take 12.5 mg by mouth daily.    Marland Kitchen METFORMIN HCL 500 MG PO TABS Oral Take 500 mg by mouth 2 (two) times daily.    Marland Kitchen METOPROLOL SUCCINATE ER 25 MG PO TB24 Oral Take 1 tablet (25 mg total) by mouth daily. 30 tablet 6  . PANTOPRAZOLE SODIUM 40 MG PO TBEC  Oral Take 40 mg by mouth daily.    Marland Kitchen PAROXETINE HCL ER 37.5 MG PO TB24 Oral Take 37.5 mg by mouth every morning.      Marland Kitchen ROSUVASTATIN CALCIUM 10 MG PO TABS Oral Take 10 mg by mouth daily.        BP 135/77  Pulse 99  Temp(Src) 98.4 F (36.9 C) (Oral)  Resp 26  Ht 5\' 4"  (1.626 m)  Wt 258 lb (117.028 kg)  BMI 44.29 kg/m2  SpO2 97%  Physical Exam  Constitutional: She is oriented to person, place, and time. She appears well-developed and well-nourished.  HENT:  Head: Normocephalic and atraumatic.  Eyes: Conjunctivae and EOM are normal. Pupils are equal, round, and reactive to light.  Neck: Trachea normal. Neck supple. No thyromegaly present.  Cardiovascular: Normal rate, regular rhythm, S1 normal, S2 normal and normal  pulses.     No systolic murmur is present   No diastolic murmur is present  Pulses:      Radial pulses are 2+ on the right side, and 2+ on the left side.  Pulmonary/Chest: Effort normal and breath sounds normal. She has no wheezes. She has no rhonchi. She has no rales. She exhibits no tenderness.  Abdominal: Soft. Normal appearance and bowel sounds are normal. There is no tenderness. There is no CVA tenderness and negative Murphy's sign.  Musculoskeletal:       Large body habitus, area of erythema L groin in skin folds. Mil dinc warmth to touch. No crepitus or tenderness. No streaking erythema, no fluctuance or skin breaks  BLE:s Calves nontender, no cords or erythema, negative Homans sign  Neurological: She is alert and oriented to person, place, and time. She has normal strength. No cranial nerve deficit or sensory deficit. GCS eye subscore is 4. GCS verbal subscore is 5. GCS motor subscore is 6.  Skin: Skin is warm and dry. No rash noted. She is not diaphoretic.  Psychiatric: Her speech is normal.       Cooperative and appropriate    ED Course  Procedures (including critical care time)  Results for orders placed during the hospital encounter of 03/14/12  CBC      Component Value Range   WBC 5.4  4.0 - 10.5 (K/uL)   RBC 4.50  3.87 - 5.11 (MIL/uL)   Hemoglobin 11.0 (*) 12.0 - 15.0 (g/dL)   HCT 13.2 (*) 44.0 - 46.0 (%)   MCV 79.8  78.0 - 100.0 (fL)   MCH 24.4 (*) 26.0 - 34.0 (pg)   MCHC 30.6  30.0 - 36.0 (g/dL)   RDW 10.2 (*) 72.5 - 15.5 (%)   Platelets 210  150 - 400 (K/uL)  BASIC METABOLIC PANEL      Component Value Range   Sodium 140  135 - 145 (mEq/L)   Potassium 3.2 (*) 3.5 - 5.1 (mEq/L)   Chloride 102  96 - 112 (mEq/L)   CO2 28  19 - 32 (mEq/L)   Glucose, Bld 111 (*) 70 - 99 (mg/dL)   BUN 11  6 - 23 (mg/dL)   Creatinine, Ser 3.66  0.50 - 1.10 (mg/dL)   Calcium 9.3  8.4 - 44.0 (mg/dL)   GFR calc non Af Amer >90  >90 (mL/min)   GFR calc Af Amer >90  >90 (mL/min)   Ct  Pelvis Wo Contrast  03/15/2012  *RADIOLOGY REPORT*  Clinical Data:  Left groin redness and pain.  CT PELVIS WITHOUT CONTRAST  Technique:  Multidetector CT imaging of  the pelvis was performed following the standard protocol without intravenous contrast.  Comparison:  04/23/2011  Findings:  Evaluation for abscess or vascular abnormality is limited without intravenous contrast. Furthermore, the examination is degraded by patient body habitus/artifact. No fracture identified. No abnormality identified within the left groin. Nonobstructive bowel. The inferior aspect of the liver is low attenuation, in keeping with  hepatic steatosis.  IMPRESSION: No acute abnormality identified.  Original Report Authenticated By: Waneta Martins, M.D.    IV ABx. Labs as above no leukocytosis. Imaging obtained and reviewed no free air.  Rash edges at outlined. MDM   Rash left groin without clinical features of necrotizing fasciitis. Treated with antibiotics as above and plan discharge home with antibiotics, nystatin ointment and recheck 48 hours. Reliable historian and agrees to sooner evaluation for any worsening condition or spreading rash.        Sunnie Nielsen, MD 03/15/12 [phone]

## 2012-03-15 NOTE — Discharge Instructions (Signed)
Cutaneous Candidiasis  Take medications as prescribed and return here or be evaluated by your doctor in 2 days for recheck. He evaluated sooner for any worsening condition, fevers, severe pain or a rapidly spreading rash.  Cutaneous candidiasis is a condition in which there is an overgrowth of yeast (candida) on the skin. Yeast normally live on the skin, but in small enough numbers not to cause any symptoms. In certain cases, increased growth of the yeast may cause an actual yeast infection. This kind of infection usually occurs in areas of the skin that are constantly warm and moist, such as the armpits or the groin. Yeast is the most common cause of diaper rash in babies and in people who cannot control their bowel movements (incontinence). CAUSES  The fungus that most often causes cutaneous candidiasis is Candida albicans. Conditions that can increase the risk of getting a yeast infection of the skin include:  Obesity.   Pregnancy.   Diabetes.   Taking antibiotic medicine.   Taking birth control pills.   Taking steroid medicines.   Thyroid disease.   An iron or zinc deficiency.   Problems with the immune system.  SYMPTOMS   Red, swollen area of the skin.   Bumps on the skin.   Itchiness.  DIAGNOSIS  The diagnosis of cutaneous candidiasis is usually based on its appearance. Light scrapings of the skin may also be taken and viewed under a microscope to identify the presence of yeast. TREATMENT  Antifungal creams may be applied to the infected skin. In severe cases, oral medicines may be needed.  HOME CARE INSTRUCTIONS   Keep your skin clean and dry.   Maintain a healthy weight.   If you have diabetes, keep your blood sugar under control.  SEEK IMMEDIATE MEDICAL CARE IF:  Your rash continues to spread despite treatment.   You have a fever, chills, or abdominal pain.

## 2012-03-15 NOTE — ED Notes (Signed)
Pt alert & oriented x4, stable gait. Pt given discharge instructions, paperwork & prescription(s). Patient instructed to stop at the registration desk to finish any additional paperwork. pt verbalized understanding. Pt left department w/ no further questions.  

## 2012-03-15 NOTE — ED Notes (Signed)
Pt requesting some medication for pain. EDP notified.

## 2012-03-17 ENCOUNTER — Encounter (HOSPITAL_COMMUNITY): Payer: Self-pay | Admitting: *Deleted

## 2012-03-17 ENCOUNTER — Emergency Department (HOSPITAL_COMMUNITY)
Admission: EM | Admit: 2012-03-17 | Discharge: 2012-03-17 | Disposition: A | Discharge status: Home / Self Care | Payer: Medicaid Other | Attending: Emergency Medicine | Admitting: Emergency Medicine

## 2012-03-17 DIAGNOSIS — E119 Type 2 diabetes mellitus without complications: Secondary | ICD-10-CM | POA: Insufficient documentation

## 2012-03-17 DIAGNOSIS — L03119 Cellulitis of unspecified part of limb: Secondary | ICD-10-CM | POA: Insufficient documentation

## 2012-03-17 DIAGNOSIS — E669 Obesity, unspecified: Secondary | ICD-10-CM | POA: Insufficient documentation

## 2012-03-17 DIAGNOSIS — I10 Essential (primary) hypertension: Secondary | ICD-10-CM | POA: Insufficient documentation

## 2012-03-17 DIAGNOSIS — Z09 Encounter for follow-up examination after completed treatment for conditions other than malignant neoplasm: Secondary | ICD-10-CM | POA: Insufficient documentation

## 2012-03-17 DIAGNOSIS — L0390 Cellulitis, unspecified: Secondary | ICD-10-CM

## 2012-03-17 DIAGNOSIS — L02419 Cutaneous abscess of limb, unspecified: Secondary | ICD-10-CM | POA: Insufficient documentation

## 2012-03-17 MED ORDER — FAMOTIDINE 20 MG PO TABS
20.0000 mg | ORAL_TABLET | Freq: Once | ORAL | Status: AC
  Administered 2012-03-17: 20 mg via ORAL
  Filled 2012-03-17: qty 1

## 2012-03-17 MED ORDER — PROMETHAZINE HCL 25 MG PO TABS: 12.5000 mg | ORAL_TABLET | Freq: Four times a day (QID) | ORAL | Status: DC | PRN

## 2012-03-17 MED ORDER — ONDANSETRON HCL 4 MG PO TABS
4.0000 mg | ORAL_TABLET | Freq: Once | ORAL | Status: AC
  Administered 2012-03-17: 4 mg via ORAL
  Filled 2012-03-17: qty 1

## 2012-03-17 NOTE — ED Provider Notes (Signed)
History   This chart was scribed for [employer]. Colon Branch, MD by Toya Smothers. The patient was seen in room APA12/APA12. Patient's care was started at 1625.    CSN: [id number]  Arrival date & time 03/17/12  1625   First MD Initiated Contact with Patient 03/17/12 1655      Chief Complaint  Patient presents with  . Wound Check   The history is provided by the patient. No language interpreter was used.    [name] is a 46 y.o. female with h/o status of post-breast cancer treated with radiotherapy who presents to the Emergency Department for a wound check of an infection in her left thigh and groin that is being treated with clindamycin which is making her nauseated and causing diarrhea. She denies any modifying factors. She is not taking any medication to improve nausea or diarrhea. She denies fever, chills, sore throat, chest pain, vomiting, and HA. She also has a history of diabetes and hypertension. Patient denies smoking and use of alcohol.   Past Medical History  Diagnosis Date  . Breast cancer 05/22/2011    03/01/11, Stage 2, s/p lumpectomy, chemo/xrt  . Depression 06/22/2011  . GERD (gastroesophageal reflux disease) 06/22/2011  . DM (diabetes mellitus) 06/22/2011  . Hypercholesterolemia 06/22/2011  . Hypertension 06/22/2011  . Obesity 06/22/2011  . Hx of adenomatous colonic polyps 10/2007    4mm sigmoid tubular adenoma, FH colon cancer, mother in mid-50s  . Anxiety     Past Surgical History  Procedure Date  . Back surg x2   . Cholecystectomy   . Mm breast stereo bx*l*r/s     rt.  . Mastectomy partial / lumpectomy     rt.  . Colonoscopy 11/03/07    4-mm sessile polyp removed/small internal hemorrhoids/tubular adenoma, random colon bx negative for microscopic colitis  . Portacath placement     Family History  Problem Relation Age of Onset  . Colon cancer Mother     mid-50s, died of metastatic disease  . Cancer Mother   . Coronary artery disease Mother   . Diabetes type  I Mother   . Coronary artery disease Father   . Diabetes Father   . Diabetes type I Father   . Hypertension Father   . Bipolar disorder Sister   . Coronary artery disease Brother   . Hypertension Brother     History  Substance Use Topics  . Smoking status: Never Smoker   . Smokeless tobacco: Not on file  . Alcohol Use: No    Review of Systems  A complete 10 system review of systems was obtained and all systems are negative except as noted in the HPI and PMH.   Allergies  Review of patient's allergies indicates no known allergies.  Home Medications   Current Outpatient Rx  Name Route Sig Dispense Refill  . CLINDAMYCIN HCL 150 MG PO CAPS Oral Take 1 capsule (150 mg total) by mouth every 6 (six) hours. 28 capsule 0  . OMEGA-3 FATTY ACIDS 1000 MG PO CAPS Oral Take 1 g by mouth daily.     Marland Kitchen HYDROCHLOROTHIAZIDE 12.5 MG PO CAPS Oral Take 12.5 mg by mouth daily.    Marland Kitchen METFORMIN HCL 500 MG PO TABS Oral Take 500 mg by mouth 2 (two) times daily.    Marland Kitchen METOPROLOL SUCCINATE ER 25 MG PO TB24 Oral Take 1 tablet (25 mg total) by mouth daily. 30 tablet 6  . NYSTATIN 100000 UNIT/GM EX CREA  Apply to affected  area 2 times daily 15 g 0  . PANTOPRAZOLE SODIUM 40 MG PO TBEC Oral Take 40 mg by mouth daily.    Marland Kitchen PAROXETINE HCL ER 37.5 MG PO TB24 Oral Take 37.5 mg by mouth every morning.        BP 126/78  Pulse 83  Temp(Src) 98.4 F (36.9 C) (Oral)  Resp 20  Ht 5\' 4"  (1.626 m)  Wt 258 lb (117.028 kg)  BMI 44.29 kg/m2  SpO2 97%  Physical Exam  Nursing note and vitals reviewed. Constitutional: She is oriented to person, place, and time. She appears well-developed.       Pt is obese   HENT:  Head: Normocephalic and atraumatic.  Eyes: Conjunctivae and EOM are normal.  Neck: Normal range of motion. Neck supple.  Cardiovascular: Normal rate, regular rhythm, normal heart sounds and intact distal pulses.  Exam reveals no gallop and no friction rub.   No murmur heard. Pulmonary/Chest: Effort  normal. No respiratory distress. She has no wheezes. She has no rales.  Abdominal: Soft. She exhibits no distension.  Musculoskeletal: Normal range of motion. She exhibits no edema.  Neurological: She is alert and oriented to person, place, and time. No cranial nerve deficit.  Skin: Skin is warm and dry. There is erythema.       Area of erythema on left upper thigh and groin is approximately 5-6 cm and is resolving; no open lesions or drainage noted  Psychiatric: She has a normal mood and affect. Her behavior is normal.    ED Course  Procedures (including critical care time)  DIAGNOSTIC STUDIES: Oxygen Saturation is 97% on room air, adequate by my interpretation.    COORDINATION OF CARE: 5:00PM- Discussed changing dosage of antibiotic and antinausea medications and Pt agreed      MDM  Patient presents for a wound recheck to an area of cellulitis on the inner upper thigh on the left. The cellulitis is resolving on clindamycin. Patient is having side effects from the clindamycin including nausea, vomiting, and diarrhea. Initiated antibiotic therapy. Counseled her on taking clindamycin with a small amount of fluid on the stomach. Pt stable in ED with no significant deterioration in condition.The patient appears reasonably screened and/or stabilized for discharge and I doubt any other medical condition or other [facility] requiring further screening, evaluation, or treatment in the ED at this time prior to discharge.  I personally performed the services described in this documentation, which was scribed in my presence. The recorded information has been reviewed and considered.   MDM Reviewed: nursing note and vitals         Nicoletta Dress. Colon Branch, MD 03/19/12 1032

## 2012-03-17 NOTE — ED Notes (Signed)
Seen here for "sore area because of my diabetes".  Told to return for recheck today.  Area is at lt thigh

## 2012-03-17 NOTE — Discharge Instructions (Signed)
The area is healing. CHANGE THE ANTIBIOTIC TO ONE PILL TWICE A DAY INSTEAD OF 4 TIMES A DAY. Use pepcid twice a day to help with the upset stomach. Use the nausea medicine as needed. Be sure to have a little something on your stomach when you take the medicines. Use the BRAT diet to help with the diarrhea. If the abdominal pain or diarrhea become unbearable, return to the ER to discontinue the antibiotic and try a different one.     Cellulitis Cellulitis is an infection of the tissue under the skin. The infected area is usually red and tender. This is caused by germs. These germs enter the body through cuts or sores. This usually happens in the arms or lower legs. HOME CARE   Take your medicine as told. Finish it even if you start to feel better.   If the infection is on the arm or leg, keep it raised (elevated).   Use a warm cloth on the infected area several times a day.   See your doctor for a follow-up visit as told.  GET HELP RIGHT AWAY IF:   You are tired or confused.   You throw up (vomit).   You have watery poop (diarrhea).   You feel ill and have muscle aches.   You have a fever.  MAKE SURE YOU:   Understand these instructions.   Will watch your condition.   Will get help right away if you are not doing well or get worse.  Document Released: 04/23/2008 Document Revised: 10/25/2011 Document Reviewed: 10/07/2009 [facility] Patient Information 2012 [place], [place].Cellulitis Cellulitis is an infection of the tissue under the skin. The infected area is usually red and tender. This is caused by germs. These germs enter the body through cuts or sores. This usually happens in the arms or lower legs. HOME CARE   Take your medicine as told. Finish it even if you start to feel better.   If the infection is on the arm or leg, keep it raised (elevated).   Use a warm cloth on the infected area several times a day.   See your doctor for a follow-up visit as told.  GET HELP RIGHT  AWAY IF:   You are tired or confused.   You throw up (vomit).   You have watery poop (diarrhea).   You feel ill and have muscle aches.   You have a fever.  MAKE SURE YOU:   Understand these instructions.   Will watch your condition.   Will get help right away if you are not doing well or get worse.  Document Released: 04/23/2008 Document Revised: 10/25/2011 Document Reviewed: 10/07/2009 [facility] Patient Information 2012 [place], [place].

## 2012-04-21 ENCOUNTER — Encounter: Payer: Self-pay | Admitting: Gastroenterology

## 2012-04-24 ENCOUNTER — Encounter (HOSPITAL_COMMUNITY): Payer: Medicaid Other

## 2012-04-28 ENCOUNTER — Encounter (HOSPITAL_COMMUNITY): Payer: Self-pay

## 2012-05-02 ENCOUNTER — Encounter (HOSPITAL_COMMUNITY): Payer: Medicaid Other | Attending: Oncology

## 2012-05-02 DIAGNOSIS — T50904A Poisoning by unspecified drugs, medicaments and biological substances, undetermined, initial encounter: Secondary | ICD-10-CM | POA: Insufficient documentation

## 2012-05-02 DIAGNOSIS — C50919 Malignant neoplasm of unspecified site of unspecified female breast: Secondary | ICD-10-CM | POA: Insufficient documentation

## 2012-05-02 DIAGNOSIS — C50119 Malignant neoplasm of central portion of unspecified female breast: Secondary | ICD-10-CM

## 2012-05-02 DIAGNOSIS — Z452 Encounter for adjustment and management of vascular access device: Secondary | ICD-10-CM

## 2012-05-02 MED ORDER — SODIUM CHLORIDE 0.9 % IJ SOLN
20.0000 mL | INTRAMUSCULAR | Status: DC | PRN
  Administered 2012-05-02: 20 mL via INTRAVENOUS
  Filled 2012-05-02: qty 20

## 2012-05-02 MED ORDER — HEPARIN SOD (PORK) LOCK FLUSH 100 UNIT/ML IV SOLN
INTRAVENOUS | Status: AC
  Filled 2012-05-02: qty 5

## 2012-05-02 MED ORDER — HEPARIN SOD (PORK) LOCK FLUSH 100 UNIT/ML IV SOLN
500.0000 [IU] | Freq: Once | INTRAVENOUS | Status: AC
  Administered 2012-05-02: 500 [IU] via INTRAVENOUS
  Filled 2012-05-02: qty 5

## 2012-05-02 MED ORDER — SODIUM CHLORIDE 0.9 % IJ SOLN
INTRAMUSCULAR | Status: AC
  Filled 2012-05-02: qty 10

## 2012-05-02 NOTE — Progress Notes (Signed)
[name] presented for Portacath access and flush. Proper placement of portacath confirmed by CXR. Portacath located left chest wall accessed with  H 20 needle. Good blood return present. Portacath flushed with 20ml NS and 500U/54ml Heparin and needle removed intact. Procedure without incident. Patient tolerated procedure well.

## 2012-05-09 NOTE — H&P (Signed)
[name] is an 46 y.o. female.   Chief Complaint: Right breast carcinoma, finished with chemotherapy HPI: Patient is a 46 year old white female who has finished her chemotherapy and presents for removal of her Port-A-Cath.  Past Medical History  Diagnosis Date  . Breast cancer 05/22/2011    03/01/11, Stage 2, s/p lumpectomy, chemo/xrt  . Depression 06/22/2011  . GERD (gastroesophageal reflux disease) 06/22/2011  . DM (diabetes mellitus) 06/22/2011  . Hypercholesterolemia 06/22/2011  . Hypertension 06/22/2011  . Obesity 06/22/2011  . Hx of adenomatous colonic polyps 10/2007    4mm sigmoid tubular adenoma, FH colon cancer, mother in mid-50s  . Anxiety     Past Surgical History  Procedure Date  . Back surg x2   . Cholecystectomy   . Mm breast stereo bx*l*r/s     rt.  . Mastectomy partial / lumpectomy     rt.  . Colonoscopy 11/03/07    4-mm sessile polyp removed/small internal hemorrhoids/tubular adenoma, random colon bx negative for microscopic colitis  . Portacath placement     Family History  Problem Relation Age of Onset  . Colon cancer Mother     mid-50s, died of metastatic disease  . Cancer Mother   . Coronary artery disease Mother   . Diabetes type I Mother   . Coronary artery disease Father   . Diabetes Father   . Diabetes type I Father   . Hypertension Father   . Bipolar disorder Sister   . Coronary artery disease Brother   . Hypertension Brother    Social History:  reports that she has never smoked. She does not have any smokeless tobacco history on file. She reports that she does not drink alcohol or use illicit drugs.  Allergies: No Known Allergies  No prescriptions prior to admission    No results found for this or any previous visit (from the past 48 hour(s)). No results found.  Review of Systems  Constitutional: Negative.   HENT: Negative.   Eyes: Negative.   Respiratory: Negative.   Cardiovascular: Negative.   Gastrointestinal: Negative.     Genitourinary: Negative.   Musculoskeletal: Negative.   Skin: Negative.   Endo/Heme/Allergies: Negative.   Psychiatric/Behavioral: Negative.     There were no vitals taken for this visit. Physical Exam  Constitutional: She appears well-developed and well-nourished.  HENT:  Head: Normocephalic and atraumatic.  Neck: Normal range of motion. Neck supple.  Cardiovascular: Normal rate, regular rhythm and normal heart sounds.   Respiratory: Effort normal and breath sounds normal.       Port in place left upper chest.  GI: Soft. Bowel sounds are normal.     Assessment/Plan Impression: Right breast carcinoma, finished with chemotherapy Plan: Patient will have her Port-A-Cath removed on 05/26/2012. The risks and benefits of the procedure were fully explained to the patient, gave informed consent. This will be done in the minor procedure room.  , A 05/09/2012, 7:34 AM

## 2012-05-14 ENCOUNTER — Encounter (HOSPITAL_COMMUNITY): Payer: Self-pay

## 2012-05-26 ENCOUNTER — Encounter (HOSPITAL_COMMUNITY): Payer: Self-pay | Admitting: *Deleted

## 2012-05-26 ENCOUNTER — Encounter (HOSPITAL_COMMUNITY)
Admission: RE | Disposition: A | Discharge status: Home / Self Care | Payer: Self-pay | Source: Ambulatory Visit | Attending: General Surgery

## 2012-05-26 ENCOUNTER — Ambulatory Visit (HOSPITAL_COMMUNITY)
Admission: RE | Admit: 2012-05-26 | Discharge: 2012-05-26 | Disposition: A | Discharge status: Home / Self Care | Payer: Medicaid Other | Source: Ambulatory Visit | Attending: General Surgery | Admitting: General Surgery

## 2012-05-26 DIAGNOSIS — Z9221 Personal history of antineoplastic chemotherapy: Secondary | ICD-10-CM | POA: Insufficient documentation

## 2012-05-26 DIAGNOSIS — Z452 Encounter for adjustment and management of vascular access device: Secondary | ICD-10-CM | POA: Insufficient documentation

## 2012-05-26 DIAGNOSIS — E7800 Pure hypercholesterolemia, unspecified: Secondary | ICD-10-CM | POA: Insufficient documentation

## 2012-05-26 DIAGNOSIS — C50919 Malignant neoplasm of unspecified site of unspecified female breast: Secondary | ICD-10-CM | POA: Insufficient documentation

## 2012-05-26 DIAGNOSIS — I10 Essential (primary) hypertension: Secondary | ICD-10-CM | POA: Insufficient documentation

## 2012-05-26 DIAGNOSIS — Z01812 Encounter for preprocedural laboratory examination: Secondary | ICD-10-CM | POA: Insufficient documentation

## 2012-05-26 DIAGNOSIS — E119 Type 2 diabetes mellitus without complications: Secondary | ICD-10-CM | POA: Insufficient documentation

## 2012-05-26 HISTORY — PX: PORT-A-CATH REMOVAL: SHX5289

## 2012-05-26 LAB — GLUCOSE, CAPILLARY: Glucose-Capillary: 117 mg/dL — ABNORMAL HIGH (ref 70–99)

## 2012-05-26 SURGERY — REMOVAL PORT-A-CATH
Anesthesia: LOCAL

## 2012-05-26 MED ORDER — LIDOCAINE HCL (PF) 1 % IJ SOLN
INTRAMUSCULAR | Status: AC
  Filled 2012-05-26: qty 30

## 2012-05-26 MED ORDER — LIDOCAINE HCL (PF) 1 % IJ SOLN
INTRAMUSCULAR | Status: DC | PRN
  Administered 2012-05-26: 30 mL via SUBCUTANEOUS

## 2012-05-26 MED ORDER — HYDROCODONE-ACETAMINOPHEN 5-325 MG PO TABS: 1.0000 | ORAL_TABLET | Freq: Four times a day (QID) | ORAL | Status: AC | PRN

## 2012-05-26 SURGICAL SUPPLY — 18 items
BAG HAMPER (MISCELLANEOUS) IMPLANT
CLOTH BEACON ORANGE TIMEOUT ST (SAFETY) IMPLANT
DURAPREP 26ML APPLICATOR (WOUND CARE) IMPLANT
GLOVE BIO SURGEON STRL SZ7.5 (GLOVE) IMPLANT
GOWN STRL REIN XL XLG (GOWN DISPOSABLE) IMPLANT
MANIFOLD NEPTUNE II (INSTRUMENTS) IMPLANT
NDL HYPO 25X1 1.5 SAFETY (NEEDLE) IMPLANT
NEEDLE HYPO 25X1 1.5 SAFETY (NEEDLE) IMPLANT
NS IRRIG 1000ML POUR BTL (IV SOLUTION) IMPLANT
PACK MINOR (CUSTOM PROCEDURE TRAY) IMPLANT
SET BASIN LINEN APH (SET/KITS/TRAYS/PACK) IMPLANT
SPONGE GAUZE 2X2 8PLY STRL LF (GAUZE/BANDAGES/DRESSINGS) IMPLANT
STRIP CLOSURE SKIN 1/4X3 (GAUZE/BANDAGES/DRESSINGS) IMPLANT
SUT VIC AB 3-0 SH 27 (SUTURE)
SUT VIC AB 3-0 SH 27X BRD (SUTURE) IMPLANT
SUT VIC AB 4-0 PS2 27 (SUTURE) IMPLANT
SYR 20CC LL (SYRINGE) IMPLANT
SYR CONTROL 10ML LL (SYRINGE) IMPLANT

## 2012-05-26 NOTE — Discharge Instructions (Signed)
May shower tonight.  Ice to affected area as needed.

## 2012-05-26 NOTE — Op Note (Signed)
Patient:  [name]  DOB:  Apr 24, 1966  MRN:  [id number]   Preop Diagnosis:  Right breast carcinoma, finished with chemotherapy  Postop Diagnosis:  Same  Procedure:  Removal of Port-A-Cath  Surgeon:  [name], M.D.  Anes:  Local  Indications:  Patient is a 46 year old white female status post chemotherapy treatment for right breast carcinoma who now comes for Port-A-Cath removal. The risks and benefits of the procedure were fully explained to the patient, gave informed consent.  Procedure note:  Patient's placed the supine position. The left upper chest was prepped and draped using the usual sterile technique with DuraPrep. Surgical site confirmation was performed. 1% Xylocaine was used for local anesthesia.  An incision was made to the previous surgical scar in the left upper chest. The dissection was taken down to the port. The port was removed in total without difficulty. It was disposed of. The subcutaneous tissue was reapproximated using 3-0 Vicryl interrupted sutures. The skin was closed using a 4-0 Vicryl subcuticular suture. Dermabond was applied.  All tape and needle counts were correct at the end of the procedure. The patient was discharged in stable condition. She tolerated the procedure well.  Complications:  None  EBL:  Minimal  Specimen:  Port-A-Cath, disposed of

## 2012-05-26 NOTE — Interval H&P Note (Signed)
History and Physical Interval Note:  05/26/2012 8:25 AM  [name]  has presented today for surgery, with the diagnosis of breast cancer  The various methods of treatment have been discussed with the patient and family. After consideration of risks, benefits and other options for treatment, the patient has consented to  Procedure(s) (LRB): REMOVAL PORT-A-CATH (N/A) as a surgical intervention .  The patient's history has been reviewed, patient examined, no change in status, stable for surgery.  I have reviewed the patients' chart and labs.  Questions were answered to the patient's satisfaction.     [name] A

## 2012-05-29 ENCOUNTER — Ambulatory Visit: Payer: Medicaid Other | Admitting: Gastroenterology

## 2012-06-02 ENCOUNTER — Encounter (HOSPITAL_COMMUNITY): Payer: Self-pay | Admitting: General Surgery

## 2012-06-13 ENCOUNTER — Encounter (HOSPITAL_COMMUNITY): Payer: Self-pay

## 2012-07-25 ENCOUNTER — Encounter (HOSPITAL_COMMUNITY): Payer: Self-pay | Admitting: Oncology

## 2012-07-25 ENCOUNTER — Encounter (HOSPITAL_COMMUNITY): Payer: Medicaid Other | Attending: Oncology | Admitting: Oncology

## 2012-07-25 VITALS — BP 115/73 | HR 85 | Temp 98.2°F | Resp 18 | Wt 262.2 lb

## 2012-07-25 DIAGNOSIS — I10 Essential (primary) hypertension: Secondary | ICD-10-CM

## 2012-07-25 DIAGNOSIS — T50904A Poisoning by unspecified drugs, medicaments and biological substances, undetermined, initial encounter: Secondary | ICD-10-CM | POA: Insufficient documentation

## 2012-07-25 DIAGNOSIS — E119 Type 2 diabetes mellitus without complications: Secondary | ICD-10-CM

## 2012-07-25 DIAGNOSIS — G609 Hereditary and idiopathic neuropathy, unspecified: Secondary | ICD-10-CM

## 2012-07-25 DIAGNOSIS — C50919 Malignant neoplasm of unspecified site of unspecified female breast: Secondary | ICD-10-CM

## 2012-07-25 NOTE — Progress Notes (Signed)
[name], MD [facility] [address], [address] [place] [address]  1. Breast cancer     CURRENT THERAPY:  INTERVAL HISTORY: [name] 46 y.o. female returns for  regular  visit for followup of Stage II cancer of the breast on the right with a 3.8 cm mass, grade 3 invasive ductal carcinoma with clear margins no lymphovascular space invasion and a single negative sentinel node. ER receptors were 0, PR receptor was 2% with moderate staining only. Ki-67 marker was high at 90%, HER-2/neu was negative. CT scans and bone scan showed no evidence for metastatic disease.  S/P radiation completing in Feb 2013. S/P AC x 4 followed by weekly Taxol chemotherapy (completed on 10/19/2011). S/P Lumpectomy.   Unfortunately [name] has lost her Medicaid.  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  She is doing well in that regard.    She does complain of worsening peripheral neuropathy that does not interfere with ADLs, but is uncomfortable for her.  She has not been checking her glucose regularly.  I suspect the worsening of this since completing therapy is secondary to poorly controlled diabetes.  I have asked her to follow-up with the free clinic   She is due for her mammogram, but she has no way to pay for it.  I will ask our Financial Counselor to get involved and see if there is any cone assistance for her.   She started having her menses a few weeks ago.  She was not expecting this, but it is not uncommon for someone of her age to start having that after completion of chemotherapy.  She is to keep an eye out for heavy menstrual cycles and irregularities that may warrant gynecologic intervention.   Otherwise, she is doing well.  Complete ROS questioning is negative.  She does have some minor post-surgical neuropathic pain of the right breast.  Past Medical History  Diagnosis Date  . Breast cancer 05/22/2011    03/01/11, Stage 2, s/p lumpectomy, chemo/xrt  . Depression  06/22/2011  . GERD (gastroesophageal reflux disease) 06/22/2011  . DM (diabetes mellitus) 06/22/2011  . Hypercholesterolemia 06/22/2011  . Hypertension 06/22/2011  . Obesity 06/22/2011  . Hx of adenomatous colonic polyps 10/2007    4mm sigmoid tubular adenoma, FH colon cancer, mother in mid-50s  . Anxiety     has Breast cancer; Depression; GERD (gastroesophageal reflux disease); DM (diabetes mellitus); Hypercholesterolemia; Hypertension; Obesity; H/O adenomatous polyp of colon; FH: colon cancer; Esophageal dysphagia; and Chronic diarrhea on her problem list.      has no known allergies.  [name] had no medications administered during this visit.  Past Surgical History  Procedure Date  . Back surg x2   . Cholecystectomy   . Mm breast stereo bx*l*r/s     rt.  . Mastectomy partial / lumpectomy     rt.  . Colonoscopy 11/03/07    4-mm sessile polyp removed/small internal hemorrhoids/tubular adenoma, random colon bx negative for microscopic colitis  . Portacath placement   . Port-a-cath removal 05/26/2012    Procedure: REMOVAL PORT-A-CATH;  Surgeon: [name], MD;  Location: [facility];  Service: General;  Laterality: N/A;  Minor Room    Denies any headaches, dizziness, double vision, fevers, chills, night sweats, nausea, vomiting, diarrhea, constipation, chest pain, heart palpitations, shortness of breath, blood in stool, black tarry stool, urinary pain, urinary burning, urinary frequency, hematuria.   PHYSICAL EXAMINATION  ECOG PERFORMANCE STATUS: 1 - Symptomatic but completely  ambulatory  Filed Vitals:   07/25/12 1217  BP: 115/73  Pulse: 85  Temp: 98.2 F (36.8 C)  Resp: 18    GENERAL:alert, no distress, well nourished, well developed, comfortable, cooperative, obese and smiling SKIN: skin color, texture, turgor are normal, no rashes or significant lesions HEAD: Normocephalic, No masses, lesions, tenderness or abnormalities EYES: normal, Conjunctiva are pink and  non-injected EARS: External ears normal OROPHARYNX:lips, buccal mucosa, and tongue normal and mucous membranes are moist  NECK: supple, no adenopathy, thyroid normal size, non-tender, without nodularity, no stridor, non-tender, trachea midline LYMPH:  no palpable lymphadenopathy, no hepatosplenomegaly BREAST:left breast normal without mass, skin or nipple changes or axillary nodes, right post-lumpectomy site well healed and free of suspicious changes with some mild hyperpigmentation on the inferior section of breast. LUNGS: clear to auscultation and percussion HEART: regular rate & rhythm, no murmurs, no gallops, S1 normal and S2 normal ABDOMEN:abdomen soft, non-tender, obese, normal bowel sounds, no masses or organomegaly and no hepatosplenomegaly BACK: Back symmetric, no curvature., No CVA tenderness EXTREMITIES:less then 2 second capillary refill, no joint deformities, effusion, or inflammation, no edema, no skin discoloration, no clubbing, no cyanosis  NEURO: alert & oriented x 3 with fluent speech, no focal motor/sensory deficits, gait normal    PATHOLOGY: 03/21/2011  FINAL DIAGNOSIS  Diagnosis  1. Lymph node, sentinel, biopsy, right  - ONE LYMPH NODE, NEGATIVE FOR METASTATIC CARCINOMA (0/1).  2. Breast, lumpectomy, right  - INVASIVE DUCTAL CARCINOMA, 3.8 CM, NOTTINGHAM COMBINED HISTOLOGIC GRADE  III.  - NO EVIDENCE OF ANGIOLYMPHATIC INVASION IDENTIFIED.  - RESECTION MARGINS ARE CLEAR  - PLEASE SEE ONCOLOGY TEMPLATE FOR DETAIL.  Microscopic Comment  2. BREAST, WITH LYMPH NODE SAMPLING  Specimen, including laterality: Right breast  Procedure: Lumpectomy  Tumor size of largest invasive carcinoma (gross measurement): 3.8 cm  Margins: Negative  Invasive component, distance to closest margin: Superior and lateral margins: 0.2 cm  In situ component, distance to closest margin: Inferior margin: 0.1 cm  Lymph - Vascular invasion: Not identified  Histologic type, invasive component:  Invasive ductal carcinoma  Grade, invasive component (Nottingham combined histologic score): III  Tubule formation grade: 3  Nuclear pleomorphism grade: 2  Mitotic grade: 3  Ductal carcinoma in situ: Present  Nuclear grade: Low grade  Necrosis: Not identified  Extensive intraductal component: No  Lobular neoplasia present: No  Treatment effect (if treated with neoadjuvant therapy): No  Multicentric (separate tumors in different quadrants): N/A  Multifocal (separate tumors in same quadrant or biopsy): No  Macroscopic or microscopic extent of tumor:  Skin: N/A  1 of 4  FINAL for HERNANDEZ, Corrisa L ([id number])  Microscopic Comment(continued)  Nipple: N/A  Skeletal muscle: No  Axillary lymph nodes:  Number examined: 1  Number with metastasis: 0  ITC (isolated tumor cells, < 0.27mm): 0  Micrometastasis (> 0.85mm, < 2mm): 0  Metastasis > 2 mm: 0  Extracapsular extension: No  Method of detection of metastases:  H&E, cytokeratin or both: H & E only  TNM Code: pT2, pN0 (sn)  Breast Prognostic Markers: Please correlate with previous case [id number]  Estrogen receptor: 0%, negative  Progesterone receptor: 2%, positive, moderate staining intensity.  Ki 67 (Mib-1): 90%  Her 2 neu by CISH: No amplification of Her 2 detected. The ratio of Her 2: CP17 signals was 1.00  A breast prognostic profile will be performed and an addendum report will follow.  Non-neoplastic breast: Unremarkable  Abigail Miyamoto MD  Pathologist, Electronic Signature  (Case signed  03/23/2011)  2. PROGNOSTIC INDICATORS - ACIS  Results  IMMUNOHISTOCHEMICAL AND MORPHOMETRIC ANALYSIS BY THE AUTOMATED CELLULAR  IMAGING SYSTEM (ACIS)  Estrogen Receptor (Negative, <1%): 0%, NEGATIVE  Progesterone Receptor (Negative, <1%): 0%, NEGATIVE  COMMENT: The negative hormone receptor study(ies) in this case have an internal positive control.  All controls stained appropriately  Pecola Leisure MD  Pathologist, Electronic Signature   ( Signed 03/28/2011)  2. CHROMOGENIC IN-SITU HYBRIDIZATION  Interpretation  HER-2/NEU BY C[facility] - NO AMPLIFICATION OF HER-2 DETECTED. THE RATIO OF HER-2: CEP 17  SIGNALS WAS 1.40.  Reference range:  Ratio: HER2:CEP17 < 1.8 - gene amplification not observed  Ratio: HER2:CEP 17 1.8-2.2 - equivocal result  Ratio: HER2:CEP17 > 2.2 - gene amplification observed  2 of 4  FINAL for HERNANDEZ, Lanesha L ([id number])  (continued)  Pecola Leisure MD  Pathologist, Electronic Signature  ( Signed 03/27/2011)    ASSESSMENT:  1. Stage II cancer of the breast on the right with a 3.8 cm mass, grade 3 invasive ductal carcinoma with clear margins no lymphovascular space invasion and a single negative sentinel node. ER receptors were 0, PR receptor was 2% with moderate staining only. Ki-67 marker was high at 90%, HER-2/neu was negative. CT scans and bone scan showed no evidence for metastatic disease.  S/P radiation completing in Feb 2013. S/P AC x 4 followed by weekly Taxol chemotherapy (completed on 10/19/2011). S/P Lumpectomy. S/P port-a-cath removal.  2. B/L wrist pain/numbness/tingling  3. Forearm extensor muscle discomfort.  4. Obesity.  5. 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 May be involved with #2 as well.  6. Diabetes mellitus on metformin.  7. Hypercholesterolemia.  8. Hypertension on Toprol and hydrochlorothiazide    PLAN:  1. Patient encouraged to follow-up at free clinic for her medical needs including diabetes control and treatment.  2. Patient is in need of screening mammogram in the near future.  3. She has lost her insurance, so I will get April Manson, Artist, involved to see if there is assistance for the patient.  4. Lab work in 6 months: CBC diff, CMET 5. Return in 6 months for follow-up   All questions were answered. The patient knows to call the clinic with any problems, questions or concerns. We can certainly see the patient much sooner if  necessary.  The patient and plan discussed with Si Gaul, MD and he is in agreement with the aforementioned.  ,

## 2012-07-25 NOTE — Patient Instructions (Addendum)
[name]  DOB 1966-06-02 CSN [id number]  MRN [id number] Dr. Glenford Peers   [facility]  Discharge Instructions  RECOMMENDATIONS MADE BY THE CONSULTANT AND ANY TEST RESULTS WILL BE SENT TO YOUR REFERRING DOCTOR.   EXAM FINDINGS BY MD TODAY AND SIGNS AND SYMPTOMS TO REPORT TO CLINIC OR PRIMARY MD: Exam and discussion per PA.  Check with Health Department and [facility] Clinic for possible assistance.  We will get our Financial Counselor to check and see if you are available for [facility].  If the bleeding worsens, becomes heavier than your normal menstrual period let us know.  Try to keep your blood sugar under control.  MEDICATIONS PRESCRIBED: none   INSTRUCTIONS GIVEN AND DISCUSSED: Other :  Report any new lumps, bone pain or shortness of breath.  SPECIAL INSTRUCTIONS/FOLLOW-UP: Lab work Needed in 6 months , Xray Studies Needed:  Mammogram - check with [place] next week about assistance and Return to Clinic in 6 months to see MD>   I acknowledge that I have been informed and understand all the instructions given to me and received a copy. I do not have any more questions at this time, but understand that I may call the Specialty Clinic at [facility] at [phone] during business hours should I have any further questions or need assistance in obtaining follow-up care.    __________________________________________  _____________  __________ Signature of Patient or Authorized Representative            Date                   Time    __________________________________________ Nurse's Signature

## 2012-08-11 ENCOUNTER — Encounter (HOSPITAL_COMMUNITY): Payer: Self-pay | Admitting: *Deleted

## 2012-08-11 ENCOUNTER — Emergency Department (HOSPITAL_COMMUNITY)
Admission: EM | Admit: 2012-08-11 | Discharge: 2012-08-11 | Disposition: A | Discharge status: Home / Self Care | Payer: Self-pay | Attending: Emergency Medicine | Admitting: Emergency Medicine

## 2012-08-11 ENCOUNTER — Emergency Department (HOSPITAL_COMMUNITY): Payer: Self-pay

## 2012-08-11 DIAGNOSIS — Z7982 Long term (current) use of aspirin: Secondary | ICD-10-CM | POA: Insufficient documentation

## 2012-08-11 DIAGNOSIS — R0602 Shortness of breath: Secondary | ICD-10-CM | POA: Insufficient documentation

## 2012-08-11 DIAGNOSIS — R109 Unspecified abdominal pain: Secondary | ICD-10-CM

## 2012-08-11 DIAGNOSIS — Z79899 Other long term (current) drug therapy: Secondary | ICD-10-CM | POA: Insufficient documentation

## 2012-08-11 DIAGNOSIS — K7689 Other specified diseases of liver: Secondary | ICD-10-CM | POA: Insufficient documentation

## 2012-08-11 DIAGNOSIS — R079 Chest pain, unspecified: Secondary | ICD-10-CM | POA: Insufficient documentation

## 2012-08-11 DIAGNOSIS — I10 Essential (primary) hypertension: Secondary | ICD-10-CM | POA: Insufficient documentation

## 2012-08-11 DIAGNOSIS — R1032 Left lower quadrant pain: Secondary | ICD-10-CM | POA: Insufficient documentation

## 2012-08-11 DIAGNOSIS — E119 Type 2 diabetes mellitus without complications: Secondary | ICD-10-CM | POA: Insufficient documentation

## 2012-08-11 DIAGNOSIS — R5383 Other fatigue: Secondary | ICD-10-CM | POA: Insufficient documentation

## 2012-08-11 DIAGNOSIS — R197 Diarrhea, unspecified: Secondary | ICD-10-CM | POA: Insufficient documentation

## 2012-08-11 DIAGNOSIS — R1012 Left upper quadrant pain: Secondary | ICD-10-CM | POA: Insufficient documentation

## 2012-08-11 DIAGNOSIS — R5381 Other malaise: Secondary | ICD-10-CM | POA: Insufficient documentation

## 2012-08-11 DIAGNOSIS — M5137 Other intervertebral disc degeneration, lumbosacral region: Secondary | ICD-10-CM | POA: Insufficient documentation

## 2012-08-11 DIAGNOSIS — M51379 Other intervertebral disc degeneration, lumbosacral region without mention of lumbar back pain or lower extremity pain: Secondary | ICD-10-CM | POA: Insufficient documentation

## 2012-08-11 LAB — COMPREHENSIVE METABOLIC PANEL
AST: 20 U/L (ref 0–37)
Albumin: 3.6 g/dL (ref 3.5–5.2)
Alkaline Phosphatase: 59 U/L (ref 39–117)
BUN: 10 mg/dL (ref 6–23)
CO2: 27 mEq/L (ref 19–32)
Calcium: 9.1 mg/dL (ref 8.4–10.5)
Chloride: 101 mEq/L (ref 96–112)
Creatinine, Ser: 0.62 mg/dL (ref 0.50–1.10)
GFR calc Af Amer: >90 mL/min (ref >90)
GFR calc non Af Amer: >90 mL/min (ref >90)
Glucose, Bld: 97 mg/dL (ref 70–99)
Potassium: 3.7 mEq/L (ref 3.5–5.1)
Sodium: 138 mEq/L (ref 135–145)
Total Bilirubin: 0.3 mg/dL (ref 0.3–1.2)

## 2012-08-11 LAB — URINALYSIS, ROUTINE W REFLEX MICROSCOPIC
Bilirubin Urine: NEGATIVE
Glucose, UA: NEGATIVE mg/dL
Ketones, ur: NEGATIVE mg/dL
Leukocytes, UA: NEGATIVE
Nitrite: NEGATIVE
Protein, ur: NEGATIVE mg/dL
Urobilinogen, UA: 0.2 mg/dL (ref 0.0–1.0)
pH: 6 (ref 5.0–8.0)

## 2012-08-11 LAB — CBC WITH DIFFERENTIAL/PLATELET
Basophils Relative: 1 % (ref 0–1)
Eosinophils Absolute: 0.1 10*3/uL (ref 0.0–0.7)
HCT: 37.7 % (ref 36.0–46.0)
Hemoglobin: 12.3 g/dL (ref 12.0–15.0)
Lymphocytes Relative: 27 % (ref 12–46)
Lymphs Abs: 1.3 10*3/uL (ref 0.7–4.0)
MCH: 28 pg (ref 26.0–34.0)
MCHC: 32.6 g/dL (ref 30.0–36.0)
Monocytes Absolute: 0.4 10*3/uL (ref 0.1–1.0)
Monocytes Relative: 7 % (ref 3–12)
Neutro Abs: 3.2 10*3/uL (ref 1.7–7.7)
Platelets: 154 10*3/uL (ref 150–400)
RBC: 4.39 MIL/uL (ref 3.87–5.11)
RDW: 16.6 % — ABNORMAL HIGH (ref 11.5–15.5)

## 2012-08-11 LAB — LIPASE, BLOOD: Lipase: 24 U/L (ref 11–59)

## 2012-08-11 LAB — PREGNANCY, URINE: Preg Test, Ur: NEGATIVE

## 2012-08-11 MED ORDER — SODIUM CHLORIDE 0.9 % IV SOLN: INTRAVENOUS | Status: DC

## 2012-08-11 MED ORDER — NAPROXEN 500 MG PO TABS: 500.0000 mg | ORAL_TABLET | Freq: Two times a day (BID) | ORAL | Status: DC

## 2012-08-11 MED ORDER — IOHEXOL 300 MG/ML  SOLN
100.0000 mL | Freq: Once | INTRAMUSCULAR | Status: AC | PRN
  Administered 2012-08-11: 100 mL via INTRAVENOUS

## 2012-08-11 MED ORDER — ONDANSETRON HCL 4 MG/2ML IJ SOLN
4.0000 mg | Freq: Once | INTRAMUSCULAR | Status: AC
  Administered 2012-08-11: 4 mg via INTRAVENOUS
  Filled 2012-08-11: qty 2

## 2012-08-11 MED ORDER — HYDROMORPHONE HCL PF 1 MG/ML IJ SOLN
1.0000 mg | Freq: Once | INTRAMUSCULAR | Status: AC
  Administered 2012-08-11: 1 mg via INTRAVENOUS
  Filled 2012-08-11: qty 1

## 2012-08-11 MED ORDER — SODIUM CHLORIDE 0.9 % IV BOLUS (SEPSIS)
250.0000 mL | Freq: Once | INTRAVENOUS | Status: AC
  Administered 2012-08-11: 250 mL via INTRAVENOUS

## 2012-08-11 MED ORDER — HYDROCODONE-ACETAMINOPHEN 5-325 MG PO TABS: 1.0000 | ORAL_TABLET | Freq: Four times a day (QID) | ORAL | Status: AC | PRN

## 2012-08-11 NOTE — Discharge Instructions (Signed)
Take medications as directed. Followup with Dr. Emelda Fear if not better. Return for a newer worse symptoms.

## 2012-08-11 NOTE — ED Provider Notes (Signed)
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 The patient was seen in room APA14/APA14. Patient's care was started at 1527.    CSN: [id number]  Arrival date & time 08/11/12  1420   First MD Initiated Contact with Patient 08/11/12 1527      Chief Complaint  Patient presents with  . Abdominal Pain    (Consider location/radiation/quality/duration/timing/severity/associated sxs/prior treatment) HPI Comments: [name] is a 47 y.o. female who presents to the Emergency Department complaining of constant, lower abdominal pain that started 2-3 days ago. She rates it an 8/10. She denies any h/o similar symptoms. She reports associated nausea, weakness, diarrhea (x3-5 times daily), urinary urgency. She also complains of left sided chest pain started around the same time as the abdominal pain. She states that it last 2-3 minutes at a time and denies any modifying factors. She denies any vomiting, blood in stool, dysuria, hematuria. She denies any radiation of pain. She states that she had chemotherapy for breast cancer over the past year and has irregular periods since. Her LNMP was 06/2011.   Pt is followed by the health dept.  Patient is a 46 y.o. female presenting with abdominal pain. The history is provided by the patient.  Abdominal Pain The primary symptoms of the illness include abdominal pain, shortness of breath, nausea and diarrhea. The primary symptoms of the illness do not include fever, vomiting or dysuria. The current episode started more than 2 days ago. The onset of the illness was gradual.  The abdominal pain is located in the LLQ and RLQ. The abdominal pain does not radiate. The severity of the abdominal pain is 8/10.  Additional symptoms associated with the illness include urgency. Symptoms associated with the illness do not include chills or hematuria.   Past Medical  History  Diagnosis Date  . Depression 06/22/2011  . GERD (gastroesophageal reflux disease) 06/22/2011  . DM (diabetes mellitus) 06/22/2011  . Hypercholesterolemia 06/22/2011  . Hypertension 06/22/2011  . Obesity 06/22/2011  . Hx of adenomatous colonic polyps 10/2007    4mm sigmoid tubular adenoma, FH colon cancer, mother in mid-50s  . Anxiety   . Breast cancer 05/22/2011    03/01/11, Stage 2, s/p lumpectomy, chemo/xrt    Past Surgical History  Procedure Date  . Back surg x2   . Cholecystectomy   . Mm breast stereo bx*l*r/s     rt.  . Mastectomy partial / lumpectomy     rt.  . Colonoscopy 11/03/07    4-mm sessile polyp removed/small internal hemorrhoids/tubular adenoma, random colon bx negative for microscopic colitis  . Portacath placement   . Port-a-cath removal 05/26/2012    Procedure: REMOVAL PORT-A-CATH;  Surgeon: [name], MD;  Location: [facility];  Service: General;  Laterality: N/A;  Minor Room    Family History  Problem Relation Age of Onset  . Colon cancer Mother     mid-50s, died of metastatic disease  . Cancer Mother   . Coronary artery disease Mother   . Diabetes type I Mother   . Coronary artery disease Father   . Diabetes Father   . Diabetes type I Father   . Hypertension Father   . Bipolar disorder Sister   . Coronary artery disease Brother   . Hypertension Brother     History  Substance Use Topics  . Smoking status: Never Smoker   .  Smokeless tobacco: Never Used  . Alcohol Use: No    OB History    Grav Para Term Preterm Abortions TAB SAB Ect Mult Living                  Review of Systems  Constitutional: Negative for fever and chills.  Respiratory: Positive for shortness of breath. Negative for cough.   Cardiovascular: Positive for chest pain.  Gastrointestinal: Positive for nausea, abdominal pain and diarrhea. Negative for vomiting and blood in stool.  Genitourinary: Positive for urgency. Negative for dysuria and hematuria.  Skin: Negative for rash.   Neurological: Positive for weakness.  All other systems reviewed and are negative.    Allergies  Review of patient's allergies indicates no known allergies.  Home Medications   Current Outpatient Rx  Name Route Sig Dispense Refill  . ASPIRIN EC 81 MG PO TBEC Oral Take 81 mg by mouth daily.    Marland Kitchen ESOMEPRAZOLE MAGNESIUM 40 MG PO CPDR Oral Take 40 mg by mouth daily before breakfast.    . HYDROCHLOROTHIAZIDE 25 MG PO TABS Oral Take 12.5 mg by mouth daily.    Marland Kitchen METFORMIN HCL 500 MG PO TABS Oral Take 500 mg by mouth 2 (two) times daily.    Marland Kitchen METOPROLOL TARTRATE 25 MG PO TABS Oral Take 25 mg by mouth every morning.     Marland Kitchen FISH OIL 1200 MG PO CAPS Oral Take 1 capsule by mouth 2 (two) times daily.    Marland Kitchen PAROXETINE HCL ER 37.5 MG PO TB24 Oral Take 75 mg by mouth daily.     Marland Kitchen ROSUVASTATIN CALCIUM 10 MG PO TABS Oral Take 10 mg by mouth daily.    Marland Kitchen HYDROCODONE-ACETAMINOPHEN 5-325 MG PO TABS Oral Take 1-2 tablets by mouth every 6 (six) hours as needed for pain. 10 tablet 0  . NAPROXEN 500 MG PO TABS Oral Take 1 tablet (500 mg total) by mouth 2 (two) times daily. 14 tablet 0    BP 148/104  Pulse 76  Temp 98.3 F (36.8 C) (Oral)  Resp 18  Ht 5\' 4"  (1.626 m)  Wt 258 lb (117.028 kg)  BMI 44.29 kg/m2  SpO2 98%  Physical Exam  Nursing note and vitals reviewed. Constitutional: She is oriented to person, place, and time. She appears well-developed and well-nourished. No distress.  HENT:  Head: Normocephalic and atraumatic.  Eyes: EOM are normal. Pupils are equal, round, and reactive to light.  Neck: Normal range of motion. Neck supple. No tracheal deviation present.  Cardiovascular: Normal rate, regular rhythm and normal heart sounds.   No murmur heard. Pulmonary/Chest: Effort normal and breath sounds normal. No respiratory distress. She has no wheezes. She exhibits no tenderness.  Abdominal: Soft. Bowel sounds are normal. She exhibits no distension. There is no tenderness.  Musculoskeletal:  Normal range of motion. She exhibits no edema.       No swelling in ankles. No swelling in left arm. Radial pulse is 2+. Moves all extremities.   Neurological: She is alert and oriented to person, place, and time. No cranial nerve deficit.       Cranial nerves intact.   Skin: Skin is warm and dry.  Psychiatric: She has a normal mood and affect. Her behavior is normal.    ED Course  Procedures (including critical care time)  DIAGNOSTIC STUDIES: Oxygen Saturation is 98% on room air, normal by my interpretation.    COORDINATION OF CARE:  15:40-Discussed planned course of treatment with the patient including  IV fluids, pain and nausea medication, chest x-ray, CT of abdomen, blood work and UA, who is agreeable at this time.   16:15-Medication Orders: Hydromorphone (Dilaudid) injection 1 mg-once; Ondansetron (Zofran) injection 4 mg-once; Sodium chloride 0.9% bolus 250 mL-once  Results for orders placed during the hospital encounter of 08/11/12  URINALYSIS, ROUTINE W REFLEX MICROSCOPIC      Component Value Range   Color, Urine YELLOW  YELLOW   APPearance CLEAR  CLEAR   Specific Gravity, Urine 1.025  1.005 - 1.030   pH 6.0  5.0 - 8.0   Glucose, UA NEGATIVE  NEGATIVE mg/dL   Hgb urine dipstick NEGATIVE  NEGATIVE   Bilirubin Urine NEGATIVE  NEGATIVE   Ketones, ur NEGATIVE  NEGATIVE mg/dL   Protein, ur NEGATIVE  NEGATIVE mg/dL   Urobilinogen, UA 0.2  0.0 - 1.0 mg/dL   Nitrite NEGATIVE  NEGATIVE   Leukocytes, UA NEGATIVE  NEGATIVE  PREGNANCY, URINE      Component Value Range   Preg Test, Ur NEGATIVE  NEGATIVE  CBC WITH DIFFERENTIAL      Component Value Range   WBC 5.0  4.0 - 10.5 K/uL   RBC 4.39  3.87 - 5.11 MIL/uL   Hemoglobin 12.3  12.0 - 15.0 g/dL   HCT 16.1  09.6 - 04.5 %   MCV 85.9  78.0 - 100.0 fL   MCH 28.0  26.0 - 34.0 pg   MCHC 32.6  30.0 - 36.0 g/dL   RDW 40.9 (*) 81.1 - 91.4 %   Platelets 154  150 - 400 K/uL   Neutrophils Relative 64  43 - 77 %   Neutro Abs 3.2  1.7 -  7.7 K/uL   Lymphocytes Relative 27  12 - 46 %   Lymphs Abs 1.3  0.7 - 4.0 K/uL   Monocytes Relative 7  3 - 12 %   Monocytes Absolute 0.4  0.1 - 1.0 K/uL   Eosinophils Relative 2  0 - 5 %   Eosinophils Absolute 0.1  0.0 - 0.7 K/uL   Basophils Relative 1  0 - 1 %   Basophils Absolute 0.0  0.0 - 0.1 K/uL  COMPREHENSIVE METABOLIC PANEL      Component Value Range   Sodium 138  135 - 145 mEq/L   Potassium 3.7  3.5 - 5.1 mEq/L   Chloride 101  96 - 112 mEq/L   CO2 27  19 - 32 mEq/L   Glucose, Bld 97  70 - 99 mg/dL   BUN 10  6 - 23 mg/dL   Creatinine, Ser 7.82  0.50 - 1.10 mg/dL   Calcium 9.1  8.4 - 95.6 mg/dL   Total Protein 7.1  6.0 - 8.3 g/dL   Albumin 3.6  3.5 - 5.2 g/dL   AST 20  0 - 37 U/L   ALT 22  0 - 35 U/L   Alkaline Phosphatase 59  39 - 117 U/L   Total Bilirubin 0.3  0.3 - 1.2 mg/dL   GFR calc non Af Amer >90  >90 mL/min   GFR calc Af Amer >90  >90 mL/min  LIPASE, BLOOD      Component Value Range   Lipase 24  11 - 59 U/L   Dg Chest 1 View  08/11/2012  *RADIOLOGY REPORT*  Clinical Data: Chest pain.  Shortness of breath.  Breast carcinoma.  CHEST - 1 VIEW  Comparison: 05/04/2011  Findings: Left-sided power port has been removed since previous study.  Both  lungs are clear.  No evidence of pleural effusion. Heart size is within normal limits allowing for low lung volumes.  IMPRESSION: No acute findings.   Original Report Authenticated By: Danae Orleans, M.D.    Ct Abdomen Pelvis W Contrast  08/11/2012  *RADIOLOGY REPORT*  Clinical Data: Lower abdominal and pelvic pain.  Nausea.  Diarrhea. Urinary urgency.  Breast cancer.  CT ABDOMEN AND PELVIS WITH CONTRAST  Technique:  Multidetector CT imaging of the abdomen and pelvis was performed following the standard protocol during bolus administration of intravenous contrast.  Contrast: OMNIPAQUE IOHEXOL 300 MG/ML  SOLN  Comparison: 04/23/2011  Findings: Further increase in hepatic steatosis demonstrated, but no liver masses are  identified.  Surgical clips again seen from prior cholecystectomy.  No evidence of biliary ductal dilatation. The pancreas, spleen, adrenal glands, and kidneys are normal in appearance.  No evidence of hydronephrosis.  Uterus and adnexa are unremarkable in appearance.  No soft tissue masses or lymphadenopathy identified within the abdomen or pelvis.  No evidence of inflammatory process or abnormal fluid collections.  No evidence of bowel wall thickening or dilatation.  No hernia identified.  Lumbar spine degenerative changes noted, with severe degenerative disc disease at L5-S1.  IMPRESSION:  1.  No acute findings. 2.  Moderate to severe hepatic steatosis. 3.  Severe lower lumbar degenerative disc disease at L5-S1.   Original Report Authenticated By: Danae Orleans, M.D.      1. Abdominal pain       MDM  Workup in emergency part without significant findings. Followup with Dr. Emelda Fear the symptoms are not resolved. Labs without any significant abnormalities. Take Naprosyn and hydrocodone for pain. Symptoms do not seem to be related to the lumbar back area.  I personally performed the services described in this documentation, which was scribed in my presence. The recorded information has been reviewed and considered.        [name], MD 08/11/12 [phone]

## 2012-08-11 NOTE — ED Notes (Addendum)
Low abd pain nausea, no vomiting, no diarrhea.  1-2 weeks feels she is not emptying her bladder.  Feels weak  Pt had chemo last year and had no menstrual period until last month when she had spotting

## 2012-10-04 ENCOUNTER — Encounter (HOSPITAL_COMMUNITY): Payer: Self-pay

## 2012-10-04 ENCOUNTER — Emergency Department (HOSPITAL_COMMUNITY): Payer: Self-pay

## 2012-10-04 ENCOUNTER — Emergency Department (HOSPITAL_COMMUNITY)
Admission: EM | Admit: 2012-10-04 | Discharge: 2012-10-04 | Disposition: A | Discharge status: Home / Self Care | Payer: Self-pay | Attending: Emergency Medicine | Admitting: Emergency Medicine

## 2012-10-04 DIAGNOSIS — R110 Nausea: Secondary | ICD-10-CM | POA: Insufficient documentation

## 2012-10-04 DIAGNOSIS — R0789 Other chest pain: Secondary | ICD-10-CM | POA: Insufficient documentation

## 2012-10-04 DIAGNOSIS — E7800 Pure hypercholesterolemia, unspecified: Secondary | ICD-10-CM | POA: Insufficient documentation

## 2012-10-04 DIAGNOSIS — Z7982 Long term (current) use of aspirin: Secondary | ICD-10-CM | POA: Insufficient documentation

## 2012-10-04 DIAGNOSIS — F329 Major depressive disorder, single episode, unspecified: Secondary | ICD-10-CM | POA: Insufficient documentation

## 2012-10-04 DIAGNOSIS — F411 Generalized anxiety disorder: Secondary | ICD-10-CM | POA: Insufficient documentation

## 2012-10-04 DIAGNOSIS — K219 Gastro-esophageal reflux disease without esophagitis: Secondary | ICD-10-CM | POA: Insufficient documentation

## 2012-10-04 DIAGNOSIS — J40 Bronchitis, not specified as acute or chronic: Secondary | ICD-10-CM | POA: Insufficient documentation

## 2012-10-04 DIAGNOSIS — R05 Cough: Secondary | ICD-10-CM | POA: Insufficient documentation

## 2012-10-04 DIAGNOSIS — R5381 Other malaise: Secondary | ICD-10-CM | POA: Insufficient documentation

## 2012-10-04 DIAGNOSIS — E119 Type 2 diabetes mellitus without complications: Secondary | ICD-10-CM | POA: Insufficient documentation

## 2012-10-04 DIAGNOSIS — Z9889 Other specified postprocedural states: Secondary | ICD-10-CM | POA: Insufficient documentation

## 2012-10-04 DIAGNOSIS — Z79899 Other long term (current) drug therapy: Secondary | ICD-10-CM | POA: Insufficient documentation

## 2012-10-04 DIAGNOSIS — Z853 Personal history of malignant neoplasm of breast: Secondary | ICD-10-CM | POA: Insufficient documentation

## 2012-10-04 DIAGNOSIS — R059 Cough, unspecified: Secondary | ICD-10-CM | POA: Insufficient documentation

## 2012-10-04 DIAGNOSIS — E669 Obesity, unspecified: Secondary | ICD-10-CM | POA: Insufficient documentation

## 2012-10-04 DIAGNOSIS — I10 Essential (primary) hypertension: Secondary | ICD-10-CM | POA: Insufficient documentation

## 2012-10-04 DIAGNOSIS — F3289 Other specified depressive episodes: Secondary | ICD-10-CM | POA: Insufficient documentation

## 2012-10-04 LAB — CBC WITH DIFFERENTIAL/PLATELET
Basophils Absolute: 0 10*3/uL (ref 0.0–0.1)
Basophils Relative: 1 % (ref 0–1)
HCT: 39.5 % (ref 36.0–46.0)
Hemoglobin: 12.7 g/dL (ref 12.0–15.0)
Lymphocytes Relative: 23 % (ref 12–46)
MCHC: 32.2 g/dL (ref 30.0–36.0)
MCV: 89 fL (ref 78.0–100.0)
Monocytes Relative: 8 % (ref 3–12)
Neutro Abs: 4.1 10*3/uL (ref 1.7–7.7)
Neutrophils Relative %: 67 % (ref 43–77)
Platelets: 203 10*3/uL (ref 150–400)
RDW: 15.5 % (ref 11.5–15.5)
WBC: 6.1 10*3/uL (ref 4.0–10.5)

## 2012-10-04 LAB — BASIC METABOLIC PANEL
BUN: 10 mg/dL (ref 6–23)
CO2: 26 mEq/L (ref 19–32)
Chloride: 100 mEq/L (ref 96–112)
Creatinine, Ser: 0.61 mg/dL (ref 0.50–1.10)
GFR calc Af Amer: >90 mL/min (ref >90)
GFR calc non Af Amer: >90 mL/min (ref >90)
Glucose, Bld: 137 mg/dL — ABNORMAL HIGH (ref 70–99)
Potassium: 3.2 mEq/L — ABNORMAL LOW (ref 3.5–5.1)
Sodium: 138 mEq/L (ref 135–145)

## 2012-10-04 MED ORDER — PROMETHAZINE HCL 25 MG PO TABS: 25.0000 mg | ORAL_TABLET | Freq: Four times a day (QID) | ORAL | Status: DC | PRN

## 2012-10-04 MED ORDER — ONDANSETRON 4 MG PO TBDP
4.0000 mg | ORAL_TABLET | Freq: Once | ORAL | Status: AC
  Administered 2012-10-04: 4 mg via ORAL
  Filled 2012-10-04: qty 1

## 2012-10-04 MED ORDER — TRAMADOL HCL 50 MG PO TABS: 50.0000 mg | ORAL_TABLET | Freq: Four times a day (QID) | ORAL | Status: DC | PRN

## 2012-10-04 MED ORDER — OXYCODONE-ACETAMINOPHEN 5-325 MG PO TABS
1.0000 | ORAL_TABLET | Freq: Once | ORAL | Status: AC
  Administered 2012-10-04: 1 via ORAL
  Filled 2012-10-04: qty 1

## 2012-10-04 MED ORDER — ALBUTEROL SULFATE HFA 108 (90 BASE) MCG/ACT IN AERS
1.0000 | INHALATION_SPRAY | Freq: Four times a day (QID) | RESPIRATORY_TRACT | Status: DC | PRN
Start: 2012-10-04 — End: 2014-03-08

## 2012-10-04 MED ORDER — MUCINEX DM 30-600 MG PO TB12: 1.0000 | ORAL_TABLET | Freq: Two times a day (BID) | ORAL | Status: DC

## 2012-10-04 NOTE — ED Provider Notes (Signed)
History     CSN: [id number]  Arrival date & time 10/04/12  1135   First MD Initiated Contact with Patient 10/04/12 1259      Chief Complaint  Patient presents with  . Shortness of Breath    (Consider location/radiation/quality/duration/timing/severity/associated sxs/prior treatment) The history is provided by the patient.  patient is a 46 year old female primary care Dr. Is Dr. Gerda Diss, presents with a one-week history of upper respiratory infection cold-like symptoms patient's been having trouble with shortness of breath and some nausea productive cough of yellow sputum cough is worse at night. Not sure she has a fever. Complaining of some left-sided rib discomfort with the coughing. No vomiting or diarrhea. Sick contacts at work. Past medical history significant for diabetes. The chest discomfort is chest wall in nature sharp 7/10 with coughing.  Past Medical History  Diagnosis Date  . Depression 06/22/2011  . GERD (gastroesophageal reflux disease) 06/22/2011  . DM (diabetes mellitus) 06/22/2011  . Hypercholesterolemia 06/22/2011  . Hypertension 06/22/2011  . Obesity 06/22/2011  . Hx of adenomatous colonic polyps 10/2007    4mm sigmoid tubular adenoma, FH colon cancer, mother in mid-50s  . Anxiety   . Breast cancer 05/22/2011    03/01/11, Stage 2, s/p lumpectomy, chemo/xrt    Past Surgical History  Procedure Date  . Back surg x2   . Cholecystectomy   . Mm breast stereo bx*l*r/s     rt.  . Mastectomy partial / lumpectomy     rt.  . Colonoscopy 11/03/07    4-mm sessile polyp removed/small internal hemorrhoids/tubular adenoma, random colon bx negative for microscopic colitis  . Portacath placement   . Port-a-cath removal 05/26/2012    Procedure: REMOVAL PORT-A-CATH;  Surgeon: [name], MD;  Location: [facility];  Service: General;  Laterality: N/A;  Minor Room    Family History  Problem Relation Age of Onset  . Colon cancer Mother     mid-50s, died of metastatic disease  . Cancer  Mother   . Coronary artery disease Mother   . Diabetes type I Mother   . Coronary artery disease Father   . Diabetes Father   . Diabetes type I Father   . Hypertension Father   . Bipolar disorder Sister   . Coronary artery disease Brother   . Hypertension Brother     History  Substance Use Topics  . Smoking status: Never Smoker   . Smokeless tobacco: Never Used  . Alcohol Use: No    OB History    Grav Para Term Preterm Abortions TAB SAB Ect Mult Living                  Review of Systems  Constitutional: Positive for fatigue. Negative for fever and appetite change.  HENT: Positive for congestion. Negative for sore throat.   Respiratory: Positive for cough and shortness of breath. Negative for wheezing and stridor.   Cardiovascular: Positive for chest pain. Negative for palpitations.  Gastrointestinal: Positive for nausea. Negative for vomiting and abdominal pain.  Genitourinary: Negative for dysuria.  Musculoskeletal: Negative for back pain.  Skin: Negative for rash.  Neurological: Negative for headaches.  Hematological: Does not bruise/bleed easily.    Allergies  Review of patient's allergies indicates no known allergies.  Home Medications   Current Outpatient Rx  Name  Route  Sig  Dispense  Refill  . ASPIRIN EC 81 MG PO TBEC   Oral   Take 81 mg by mouth daily.         Marland Kitchen  ESOMEPRAZOLE MAGNESIUM 40 MG PO CPDR   Oral   Take 40 mg by mouth daily before breakfast.         . HYDROCHLOROTHIAZIDE 25 MG PO TABS   Oral   Take 12.5 mg by mouth daily.         Marland Kitchen METFORMIN HCL 500 MG PO TABS   Oral   Take 500 mg by mouth 2 (two) times daily.         Marland Kitchen METOPROLOL TARTRATE 25 MG PO TABS   Oral   Take 25 mg by mouth every morning.          Marland Kitchen FISH OIL 1200 MG PO CAPS   Oral   Take 1 capsule by mouth 2 (two) times daily.         Marland Kitchen PAROXETINE HCL ER 37.5 MG PO TB24   Oral   Take 75 mg by mouth daily.          Marland Kitchen ROSUVASTATIN CALCIUM 10 MG PO TABS    Oral   Take 10 mg by mouth daily.         . ALBUTEROL SULFATE HFA 108 (90 BASE) MCG/ACT IN AERS   Inhalation   Inhale 1-2 puffs into the lungs every 6 (six) hours as needed for wheezing.   1 Inhaler   0   . MUCINEX DM 30-600 MG PO TB12   Oral   Take 1 tablet by mouth every 12 (twelve) hours.   14 each   1   . PROMETHAZINE HCL 25 MG PO TABS   Oral   Take 1 tablet (25 mg total) by mouth every 6 (six) hours as needed for nausea.   12 tablet   0     BP 123/72  Pulse 86  Temp 98.6 F (37 C) (Oral)  Resp 20  Ht 5\' 4"  (1.626 m)  Wt 245 lb (111.131 kg)  BMI 42.05 kg/m2  SpO2 95%  Physical Exam  Nursing note and vitals reviewed. Constitutional: She is oriented to person, place, and time. She appears well-developed and well-nourished. No distress.  HENT:  Head: Normocephalic and atraumatic.  Mouth/Throat: Oropharynx is clear and moist. No oropharyngeal exudate.       Lips slightly dry.  Eyes: Conjunctivae normal and EOM are normal. Pupils are equal, round, and reactive to light.  Neck: Normal range of motion. Neck supple.  Cardiovascular: Normal rate, regular rhythm and normal heart sounds.   No murmur heard. Pulmonary/Chest: Effort normal and breath sounds normal. No respiratory distress. She has no wheezes. She has no rales.  Abdominal: Soft. Bowel sounds are normal. There is no tenderness.  Musculoskeletal: Normal range of motion.  Lymphadenopathy:    She has no cervical adenopathy.  Neurological: She is alert and oriented to person, place, and time. No cranial nerve deficit. She exhibits normal muscle tone. Coordination normal.  Skin: Skin is warm. No rash noted.    ED Course  Procedures (including critical care time)   Labs Reviewed  CBC WITH DIFFERENTIAL  BASIC METABOLIC PANEL   Dg Chest 2 View  10/04/2012  *RADIOLOGY REPORT*  Clinical Data: Productive cough and shortness of breath.  CHEST - 2 VIEW  Comparison: Chest x-ray 08/11/2012.  Findings: Lung  volumes are normal.  No consolidative airspace disease.  No pleural effusions.  No pneumothorax.  No pulmonary nodule or mass noted.  Pulmonary vasculature and the cardiomediastinal silhouette are within normal limits.  IMPRESSION: 1. No radiographic evidence of acute cardiopulmonary disease.  Original Report Authenticated By: Trudie Reed, M.D.     Date: 10/04/2012  Rate: 79  Rhythm: normal sinus rhythm  QRS Axis: normal  Intervals: normal  ST/T Wave abnormalities: normal  Conduction Disutrbances:none  Narrative Interpretation:   Old EKG Reviewed: none available     1. Bronchitis       MDM   Patient's symptoms are consistent with a bronchitis. Viral upper respiratory infection. No evidence of pneumonia on chest x-ray. We'll treat with Mucinex DM some Phenergan for the nausea. An albuterol inhaler.        [name], MD 10/04/12 1539

## 2012-10-04 NOTE — Discharge Instructions (Signed)
Bronchitis Bronchitis is a problem of the air tubes leading to your lungs. This problem makes it hard for air to get in and out of the lungs. You may cough a lot because your air tubes are narrow. Going without care can cause lasting (chronic) bronchitis. HOME CARE   Drink enough fluids to keep your pee (urine) clear or pale yellow.  Use a cool mist humidifier.  Quit smoking if you smoke. If you keep smoking, the bronchitis might not get better.  Only take medicine as told by your doctor. GET HELP RIGHT AWAY IF:   Coughing keeps you awake.  You start to wheeze.  You become more sick or weak.  You have a hard time breathing or get short of breath.  You cough up blood.  Coughing lasts more than 2 weeks.  You have a fever.  Your baby is older than 3 months with a rectal temperature of 102 F (38.9 C) or higher.  Your baby is 27 months old or younger with a rectal temperature of 100.4 F (38 C) or higher. MAKE SURE YOU:  Understand these instructions.  Will watch your condition.  Will get help right away if you are not doing well or get worse. Document Released: 04/23/2008 Document Revised: 01/28/2012 Document Reviewed: 10/07/2009 [facility] Patient Information 2013 [place], [place].  Use albuterol inhaler 2 puffs every 6 hours for the next week. Take Mucinex DM as directed. Take Phenergan as needed for nausea. Followup with your doctor if not better. Return here for any newer worse symptoms.

## 2012-10-04 NOTE — ED Notes (Signed)
Pt c/o cold symptoms x 1 week.  Has been taking otc cold medication but isn't helping.  Pt c/o feeling SOB and nausea.  Reports cough is productive with yellow sputum.  Unknown if has had fever.   C/O left rib pain, says is from coughing.

## 2012-10-28 ENCOUNTER — Other Ambulatory Visit (HOSPITAL_COMMUNITY): Payer: Self-pay | Admitting: Nurse Practitioner

## 2012-10-28 DIAGNOSIS — C50919 Malignant neoplasm of unspecified site of unspecified female breast: Secondary | ICD-10-CM

## 2012-10-29 IMAGING — CR DG FINGER INDEX 2+V*L*
3 series · 3 of 3 positions shown · non-contrast
Comparison: None

CLINICAL DATA: Finger laceration.

LEFT INDEX FINGER 2+V

[view not recorded (1 of 3)]
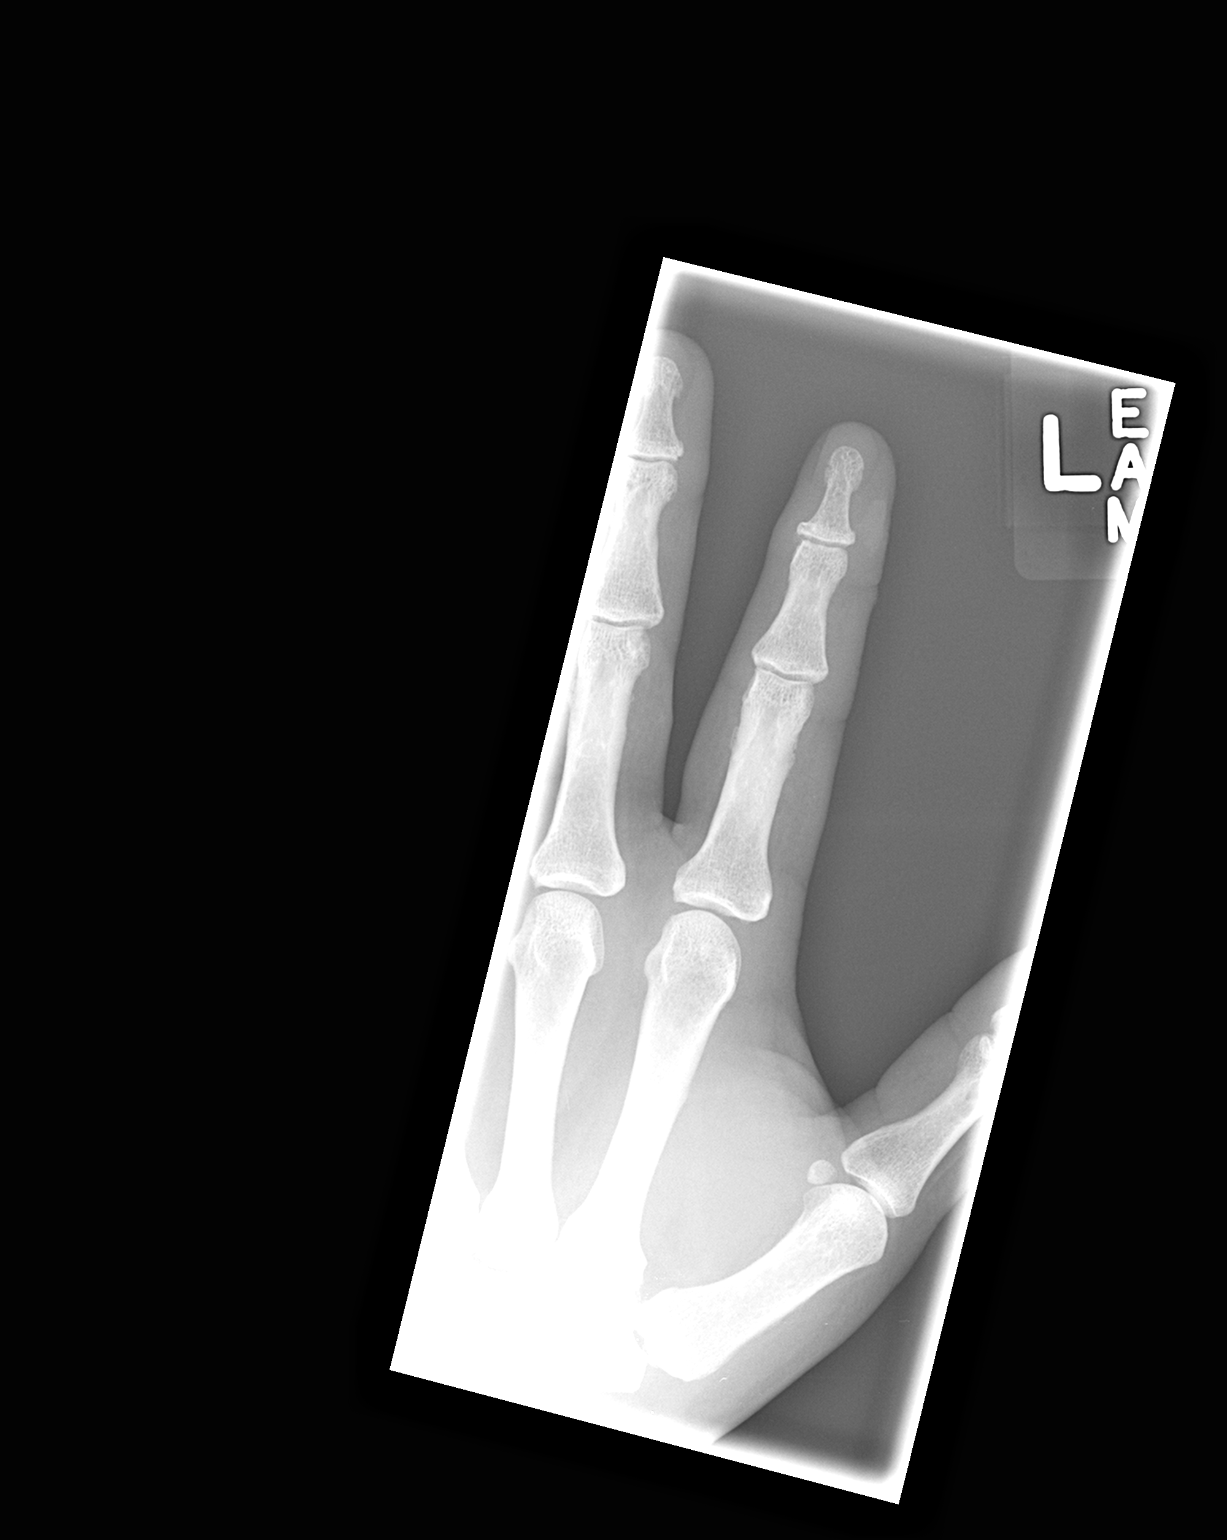

[view not recorded (2 of 3)]
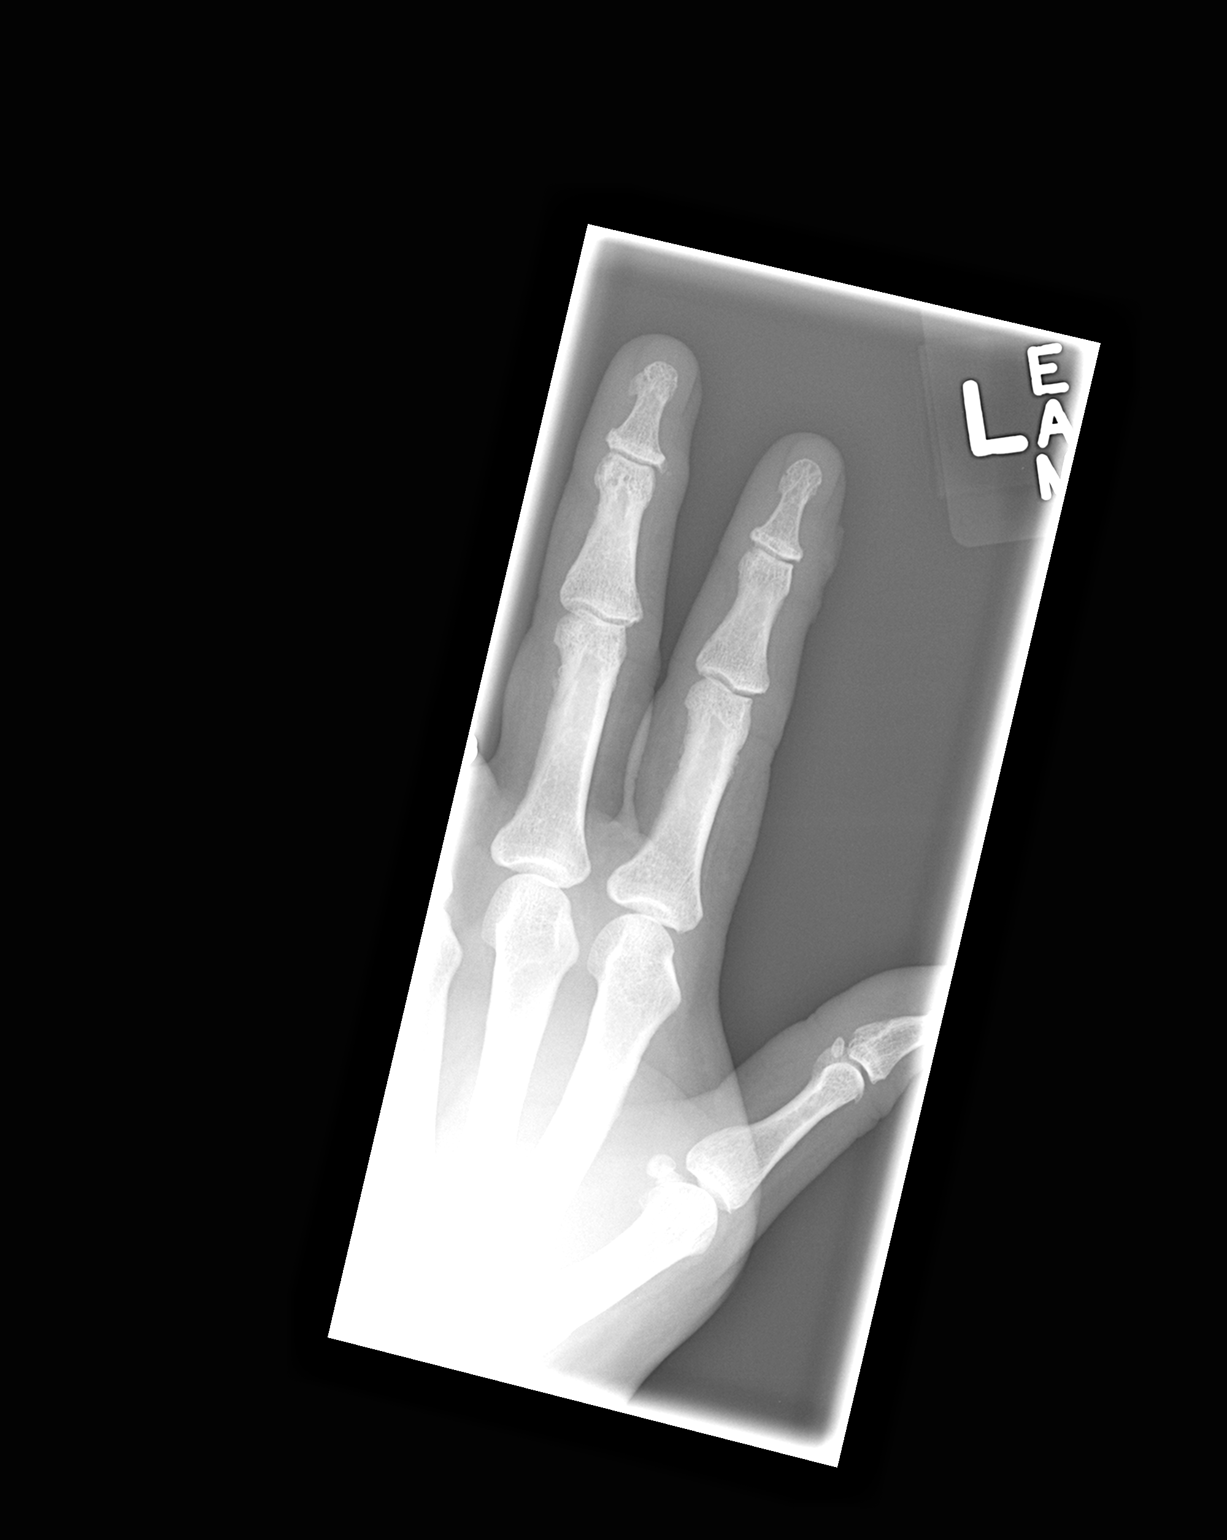

[view not recorded (3 of 3)]
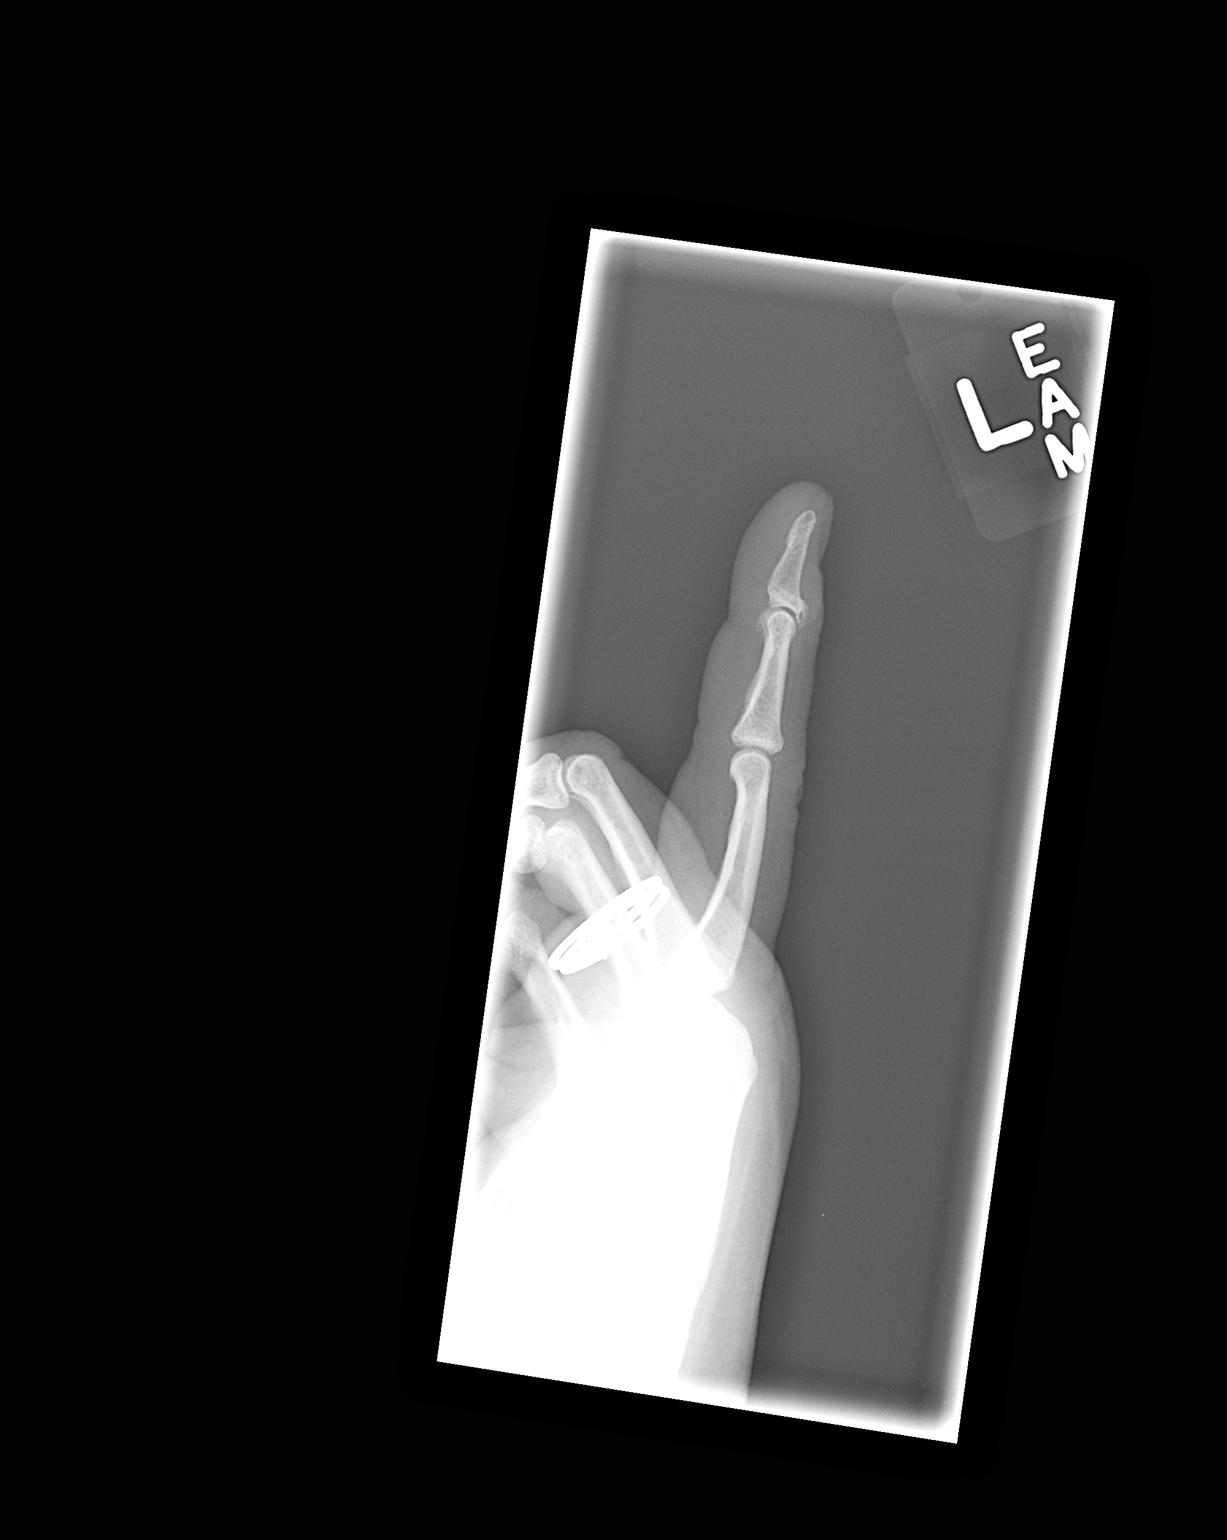

[3 of 3 positions shown; findings below may reference images not displayed]

FINDINGS: Soft tissue swelling over the distal aspect of the left
index finger.  Small bone fragment noted posteriorly at the base of
the distal phalanx, likely small avulsed fragment.  No subluxation
or dislocation.  No radiopaque foreign bodies.
IMPRESSION: Probable small fracture fragment off the posterior base of the left
index finger distal phalanx.

## 2012-11-14 ENCOUNTER — Ambulatory Visit (HOSPITAL_COMMUNITY)
Admission: RE | Admit: 2012-11-14 | Discharge: 2012-11-14 | Disposition: A | Discharge status: Home / Self Care | Payer: PRIVATE HEALTH INSURANCE | Source: Ambulatory Visit | Attending: Nurse Practitioner | Admitting: Nurse Practitioner

## 2012-11-14 DIAGNOSIS — C50919 Malignant neoplasm of unspecified site of unspecified female breast: Secondary | ICD-10-CM

## 2012-11-14 DIAGNOSIS — Z853 Personal history of malignant neoplasm of breast: Secondary | ICD-10-CM | POA: Insufficient documentation

## 2012-11-30 ENCOUNTER — Emergency Department (HOSPITAL_COMMUNITY)
Admission: EM | Admit: 2012-11-30 | Discharge: 2012-11-30 | Disposition: A | Discharge status: Home / Self Care | Payer: Self-pay | Attending: Emergency Medicine | Admitting: Emergency Medicine

## 2012-11-30 ENCOUNTER — Encounter (HOSPITAL_COMMUNITY): Payer: Self-pay | Admitting: *Deleted

## 2012-11-30 DIAGNOSIS — B356 Tinea cruris: Secondary | ICD-10-CM

## 2012-11-30 DIAGNOSIS — B379 Candidiasis, unspecified: Secondary | ICD-10-CM

## 2012-11-30 DIAGNOSIS — F411 Generalized anxiety disorder: Secondary | ICD-10-CM | POA: Insufficient documentation

## 2012-11-30 DIAGNOSIS — Z7982 Long term (current) use of aspirin: Secondary | ICD-10-CM | POA: Insufficient documentation

## 2012-11-30 DIAGNOSIS — F329 Major depressive disorder, single episode, unspecified: Secondary | ICD-10-CM | POA: Insufficient documentation

## 2012-11-30 DIAGNOSIS — Z79899 Other long term (current) drug therapy: Secondary | ICD-10-CM | POA: Insufficient documentation

## 2012-11-30 DIAGNOSIS — E669 Obesity, unspecified: Secondary | ICD-10-CM | POA: Insufficient documentation

## 2012-11-30 DIAGNOSIS — E119 Type 2 diabetes mellitus without complications: Secondary | ICD-10-CM | POA: Insufficient documentation

## 2012-11-30 DIAGNOSIS — K219 Gastro-esophageal reflux disease without esophagitis: Secondary | ICD-10-CM | POA: Insufficient documentation

## 2012-11-30 DIAGNOSIS — I10 Essential (primary) hypertension: Secondary | ICD-10-CM | POA: Insufficient documentation

## 2012-11-30 DIAGNOSIS — E7800 Pure hypercholesterolemia, unspecified: Secondary | ICD-10-CM | POA: Insufficient documentation

## 2012-11-30 DIAGNOSIS — B3789 Other sites of candidiasis: Secondary | ICD-10-CM | POA: Insufficient documentation

## 2012-11-30 DIAGNOSIS — Z85038 Personal history of other malignant neoplasm of large intestine: Secondary | ICD-10-CM | POA: Insufficient documentation

## 2012-11-30 DIAGNOSIS — Z853 Personal history of malignant neoplasm of breast: Secondary | ICD-10-CM | POA: Insufficient documentation

## 2012-11-30 DIAGNOSIS — F3289 Other specified depressive episodes: Secondary | ICD-10-CM | POA: Insufficient documentation

## 2012-11-30 LAB — GLUCOSE, CAPILLARY: Glucose-Capillary: 118 mg/dL — ABNORMAL HIGH (ref 70–99)

## 2012-11-30 MED ORDER — TRAMADOL HCL 50 MG PO TABS: 50.0000 mg | ORAL_TABLET | Freq: Four times a day (QID) | ORAL | Status: DC | PRN

## 2012-11-30 MED ORDER — CLOTRIMAZOLE 1 % EX CREA: TOPICAL_CREAM | CUTANEOUS | Status: DC

## 2012-11-30 NOTE — Discharge Instructions (Signed)
Cutaneous Candidiasis  Cutaneous candidiasis is a condition in which there is an overgrowth of yeast (candida) on the skin. Yeast normally live on the skin, but in small enough numbers not to cause any symptoms. In certain cases, increased growth of the yeast may cause an actual yeast infection. This kind of infection usually occurs in areas of the skin that are constantly warm and moist, such as the armpits or the groin. Yeast is the most common cause of diaper rash in babies and in people who cannot control their bowel movements (incontinence).  CAUSES    The fungus that most often causes cutaneous candidiasis is Candida albicans. Conditions that can increase the risk of getting a yeast infection of the skin include:   Obesity.   Pregnancy.   Diabetes.   Taking antibiotic medicine.   Taking birth control pills.   Taking steroid medicines.   Thyroid disease.   An iron or zinc deficiency.   Problems with the immune system.  SYMPTOMS     Red, swollen area of the skin.   Bumps on the skin.   Itchiness.  DIAGNOSIS   The diagnosis of cutaneous candidiasis is usually based on its appearance. Light scrapings of the skin may also be taken and viewed under a microscope to identify the presence of yeast.  TREATMENT    Antifungal creams may be applied to the infected skin. In severe cases, oral medicines may be needed.    HOME CARE INSTRUCTIONS     Keep your skin clean and dry.   Maintain a healthy weight.   If you have diabetes, keep your blood sugar under control.  SEEK IMMEDIATE MEDICAL CARE IF:   Your rash continues to spread despite treatment.   You have a fever, chills, or abdominal pain.  Document Released: 07/24/2011 Document Revised: 01/28/2012 Document Reviewed: 07/24/2011  [facility] Patient Information 2013 [place].

## 2012-11-30 NOTE — ED Notes (Signed)
Instructions, prescriptions and f/u information provided/reviewed - verbalizes understanding.  

## 2012-11-30 NOTE — ED Notes (Signed)
Pt has ?rash to inside of left groin area that she noticed yesterday, area is painful, states that her skin rubs against her lower abd area.

## 2012-12-01 NOTE — ED Provider Notes (Signed)
History     CSN: [id number]  Arrival date & time 11/30/12  1211   First MD Initiated Contact with Patient 11/30/12 1248      Chief Complaint  Patient presents with  . Rash    (Consider location/radiation/quality/duration/timing/severity/associated sxs/prior treatment) HPI Comments: [name] is a 47 y.o. Morbidly obese female with new onset of rash in her left groin which she first noticed several days ago. The rash is itching and burning in character.  She denies any new soaps, lotions or new body products and does not have this rash any where else.  She has been without fever.  She has tried putting antibiotic ointment on the area which just makes it burn more.     Patient is a 47 y.o. female presenting with rash. The history is provided by the patient.  Rash     Past Medical History  Diagnosis Date  . Depression 06/22/2011  . GERD (gastroesophageal reflux disease) 06/22/2011  . DM (diabetes mellitus) 06/22/2011  . Hypercholesterolemia 06/22/2011  . Hypertension 06/22/2011  . Obesity 06/22/2011  . Hx of adenomatous colonic polyps 10/2007    4mm sigmoid tubular adenoma, FH colon cancer, mother in mid-50s  . Anxiety   . Breast cancer 05/22/2011    03/01/11, Stage 2, s/p lumpectomy, chemo/xrt    Past Surgical History  Procedure Date  . Back surg x2   . Cholecystectomy   . Mm breast stereo bx*l*r/s     rt.  . Mastectomy partial / lumpectomy     rt.  . Colonoscopy 11/03/07    4-mm sessile polyp removed/small internal hemorrhoids/tubular adenoma, random colon bx negative for microscopic colitis  . Portacath placement   . Port-a-cath removal 05/26/2012    Procedure: REMOVAL PORT-A-CATH;  Surgeon: [name], MD;  Location: [facility];  Service: General;  Laterality: N/A;  Minor Room    Family History  Problem Relation Age of Onset  . Colon cancer Mother     mid-50s, died of metastatic disease  . Cancer Mother   . Coronary artery disease Mother   . Diabetes type I  Mother   . Coronary artery disease Father   . Diabetes Father   . Diabetes type I Father   . Hypertension Father   . Bipolar disorder Sister   . Coronary artery disease Brother   . Hypertension Brother     History  Substance Use Topics  . Smoking status: Never Smoker   . Smokeless tobacco: Never Used  . Alcohol Use: No    OB History    Grav Para Term Preterm Abortions TAB SAB Ect Mult Living                  Review of Systems  Constitutional: Negative for fever and chills.  HENT: Negative for facial swelling.   Respiratory: Negative for shortness of breath and wheezing.   Skin: Positive for rash.  Neurological: Negative for numbness.    Allergies  Review of patient's allergies indicates no known allergies.  Home Medications   Current Outpatient Rx  Name  Route  Sig  Dispense  Refill  . ALBUTEROL SULFATE HFA 108 (90 BASE) MCG/ACT IN AERS   Inhalation   Inhale 1-2 puffs into the lungs every 6 (six) hours as needed for wheezing.   1 Inhaler   0   . ASPIRIN EC 81 MG PO TBEC   Oral   Take 81 mg by mouth daily.         Marland Kitchen  CLOTRIMAZOLE 1 % EX CREA      Apply to affected area 2 times daily for up to 2 weeks   30 g   0   . MUCINEX DM 30-600 MG PO TB12   Oral   Take 1 tablet by mouth every 12 (twelve) hours.   14 each   1   . ESOMEPRAZOLE MAGNESIUM 40 MG PO CPDR   Oral   Take 40 mg by mouth daily before breakfast.         . HYDROCHLOROTHIAZIDE 25 MG PO TABS   Oral   Take 12.5 mg by mouth daily.         Marland Kitchen METFORMIN HCL 500 MG PO TABS   Oral   Take 500 mg by mouth 2 (two) times daily.         Marland Kitchen METOPROLOL TARTRATE 25 MG PO TABS   Oral   Take 25 mg by mouth every morning.          Marland Kitchen FISH OIL 1200 MG PO CAPS   Oral   Take 1 capsule by mouth 2 (two) times daily.         Marland Kitchen PAROXETINE HCL ER 37.5 MG PO TB24   Oral   Take 75 mg by mouth daily.          Marland Kitchen ROSUVASTATIN CALCIUM 10 MG PO TABS   Oral   Take 10 mg by mouth daily.           . TRAMADOL HCL 50 MG PO TABS   Oral   Take 1 tablet (50 mg total) by mouth every 6 (six) hours as needed for pain.   20 tablet   0   . TRAMADOL HCL 50 MG PO TABS   Oral   Take 1 tablet (50 mg total) by mouth every 6 (six) hours as needed for pain.   15 tablet   0     BP 117/82  Pulse 79  Temp 98.3 F (36.8 C)  Resp 18  SpO2 95%  LMP 10/27/2012  Physical Exam  Constitutional: She appears well-developed and well-nourished. No distress.       Obese,  Abdominal pannus appreciable,  With increased intertriginous area to lower abdomen and groin.  HENT:  Head: Normocephalic.  Neck: Neck supple.  Cardiovascular: Normal rate.   Pulmonary/Chest: Effort normal. She has no wheezes.  Abdominal: Soft.  Musculoskeletal: Normal range of motion. She exhibits no edema.  Skin: Rash noted. Rash is macular. There is erythema.       Red,  Erythematous macular patch left groin with several satellite lesions.  Base of skin fold is macerated  And moist. No drainage,  No pustules or induration.    ED Course  Procedures (including critical care time)  Labs Reviewed  GLUCOSE, CAPILLARY - Abnormal; Notable for the following:    Glucose-Capillary 118 (*)     All other components within normal limits   No results found.   1. Fungal infection of the groin   2. Candidiasis       MDM  Pt prescribed clotrimazole cream,  Encouraged to keep the area as dry as possible.  F/u pcp if not improving over the next 1-2 weeks.        Burgess Amor, [place] 12/01/12 [phone]

## 2012-12-01 NOTE — ED Provider Notes (Signed)
Medical screening examination/treatment/procedure(s) were performed by non-physician practitioner and as supervising physician I was immediately available for consultation/collaboration.  Prudie Guthridge, MD 12/01/12 2208 

## 2012-12-09 ENCOUNTER — Telehealth (HOSPITAL_COMMUNITY): Payer: Self-pay | Admitting: Oncology

## 2013-01-12 ENCOUNTER — Encounter (HOSPITAL_COMMUNITY): Payer: Self-pay | Admitting: *Deleted

## 2013-01-12 ENCOUNTER — Emergency Department (HOSPITAL_COMMUNITY)
Admission: EM | Admit: 2013-01-12 | Discharge: 2013-01-12 | Disposition: A | Discharge status: Home / Self Care | Payer: Self-pay | Attending: Emergency Medicine | Admitting: Emergency Medicine

## 2013-01-12 DIAGNOSIS — K219 Gastro-esophageal reflux disease without esophagitis: Secondary | ICD-10-CM | POA: Insufficient documentation

## 2013-01-12 DIAGNOSIS — Z860100 Personal history of colon polyps, unspecified: Secondary | ICD-10-CM | POA: Insufficient documentation

## 2013-01-12 DIAGNOSIS — E119 Type 2 diabetes mellitus without complications: Secondary | ICD-10-CM | POA: Insufficient documentation

## 2013-01-12 DIAGNOSIS — F411 Generalized anxiety disorder: Secondary | ICD-10-CM | POA: Insufficient documentation

## 2013-01-12 DIAGNOSIS — J3489 Other specified disorders of nose and nasal sinuses: Secondary | ICD-10-CM | POA: Insufficient documentation

## 2013-01-12 DIAGNOSIS — Z7982 Long term (current) use of aspirin: Secondary | ICD-10-CM | POA: Insufficient documentation

## 2013-01-12 DIAGNOSIS — J40 Bronchitis, not specified as acute or chronic: Secondary | ICD-10-CM

## 2013-01-12 DIAGNOSIS — Z853 Personal history of malignant neoplasm of breast: Secondary | ICD-10-CM | POA: Insufficient documentation

## 2013-01-12 DIAGNOSIS — F329 Major depressive disorder, single episode, unspecified: Secondary | ICD-10-CM | POA: Insufficient documentation

## 2013-01-12 DIAGNOSIS — Z79899 Other long term (current) drug therapy: Secondary | ICD-10-CM | POA: Insufficient documentation

## 2013-01-12 DIAGNOSIS — J209 Acute bronchitis, unspecified: Secondary | ICD-10-CM | POA: Insufficient documentation

## 2013-01-12 DIAGNOSIS — J029 Acute pharyngitis, unspecified: Secondary | ICD-10-CM | POA: Insufficient documentation

## 2013-01-12 DIAGNOSIS — R059 Cough, unspecified: Secondary | ICD-10-CM | POA: Insufficient documentation

## 2013-01-12 DIAGNOSIS — E7800 Pure hypercholesterolemia, unspecified: Secondary | ICD-10-CM | POA: Insufficient documentation

## 2013-01-12 DIAGNOSIS — I10 Essential (primary) hypertension: Secondary | ICD-10-CM | POA: Insufficient documentation

## 2013-01-12 DIAGNOSIS — E669 Obesity, unspecified: Secondary | ICD-10-CM | POA: Insufficient documentation

## 2013-01-12 DIAGNOSIS — F3289 Other specified depressive episodes: Secondary | ICD-10-CM | POA: Insufficient documentation

## 2013-01-12 MED ORDER — AZITHROMYCIN 250 MG PO TABS: 250.0000 mg | ORAL_TABLET | Freq: Every day | ORAL | Status: DC

## 2013-01-12 MED ORDER — ALBUTEROL SULFATE HFA 108 (90 BASE) MCG/ACT IN AERS
2.0000 | INHALATION_SPRAY | RESPIRATORY_TRACT | Status: AC
  Administered 2013-01-12: 2 via RESPIRATORY_TRACT
  Filled 2013-01-12: qty 6.7

## 2013-01-12 NOTE — Discharge Instructions (Signed)
Take the Zithromax for the next 5 days as prescribed, and use the albuterol inhaler - 2 puffs every 4 hours for the next 24 hours, then 2 puffs every 4 hours as needed.  You may take 1-2 weeks to totally get over this illness, return to ER for severe or worsening symptoms like high fevers, severe pain.

## 2013-01-12 NOTE — ED Provider Notes (Signed)
History     CSN: [id number]  Arrival date & time 01/12/13  1501   First MD Initiated Contact with Patient 01/12/13 1618      Chief Complaint  Patient presents with  . Chest Pain    (Consider location/radiation/quality/duration/timing/severity/associated sxs/prior treatment) HPI Comments: Pt presents with cough and sob with chest pain X 3 days - started as URI with cough, sore throat and nasal congestion, has gradually gotten worse, is persistent and is not associated with fevers, chills, rash, or vomiting.  Sx are mild to moderate.  She has no hx of COPD, but did not a visit to her PMD last year where she was treated with albuterol for wheezing.  She does not smoke but is around others who do.  Patient is a 47 y.o. female presenting with chest pain. The history is provided by the patient and medical records.  Chest Pain   Past Medical History  Diagnosis Date  . Depression 06/22/2011  . GERD (gastroesophageal reflux disease) 06/22/2011  . DM (diabetes mellitus) 06/22/2011  . Hypercholesterolemia 06/22/2011  . Hypertension 06/22/2011  . Obesity 06/22/2011  . Hx of adenomatous colonic polyps 10/2007    4mm sigmoid tubular adenoma, FH colon cancer, mother in mid-50s  . Anxiety   . Breast cancer 05/22/2011    03/01/11, Stage 2, s/p lumpectomy, chemo/xrt    Past Surgical History  Procedure Laterality Date  . Back surg x2    . Cholecystectomy    . Mm breast stereo bx*l*r/s      rt.  . Mastectomy partial / lumpectomy      rt.  . Colonoscopy  11/03/07    4-mm sessile polyp removed/small internal hemorrhoids/tubular adenoma, random colon bx negative for microscopic colitis  . Portacath placement    . Port-a-cath removal  05/26/2012    Procedure: REMOVAL PORT-A-CATH;  Surgeon: [name], MD;  Location: [facility];  Service: General;  Laterality: N/A;  Minor Room    Family History  Problem Relation Age of Onset  . Colon cancer Mother     mid-50s, died of metastatic disease  . Cancer Mother    . Coronary artery disease Mother   . Diabetes type I Mother   . Coronary artery disease Father   . Diabetes Father   . Diabetes type I Father   . Hypertension Father   . Bipolar disorder Sister   . Coronary artery disease Brother   . Hypertension Brother     History  Substance Use Topics  . Smoking status: Never Smoker   . Smokeless tobacco: Never Used  . Alcohol Use: No    OB History   Grav Para Term Preterm Abortions TAB SAB Ect Mult Living                  Review of Systems  Cardiovascular: Positive for chest pain.  All other systems reviewed and are negative.    Allergies  Review of patient's allergies indicates no known allergies.  Home Medications   Current Outpatient Rx  Name  Route  Sig  Dispense  Refill  . albuterol (PROVENTIL HFA;VENTOLIN HFA) 108 (90 BASE) MCG/ACT inhaler   Inhalation   Inhale 1-2 puffs into the lungs every 6 (six) hours as needed for wheezing.   1 Inhaler   0   . aspirin EC 81 MG tablet   Oral   Take 81 mg by mouth daily.         Marland Kitchen azithromycin (ZITHROMAX Z-PAK) 250 MG  tablet   Oral   Take 1 tablet (250 mg total) by mouth daily. 500mg  PO day 1, then 250mg  PO days 205   6 tablet   0   . clotrimazole (LOTRIMIN) 1 % cream      Apply to affected area 2 times daily for up to 2 weeks   30 g   0   . Dextromethorphan-Guaifenesin (MUCINEX DM) 30-600 MG TB12   Oral   Take 1 tablet by mouth every 12 (twelve) hours.   14 each   1   . esomeprazole (NEXIUM) 40 MG capsule   Oral   Take 40 mg by mouth daily before breakfast.         . hydrochlorothiazide (HYDRODIURIL) 25 MG tablet   Oral   Take 12.5 mg by mouth daily.         . metFORMIN (GLUCOPHAGE) 500 MG tablet   Oral   Take 500 mg by mouth 2 (two) times daily.         . metoprolol tartrate (LOPRESSOR) 25 MG tablet   Oral   Take 25 mg by mouth every morning.          . Omega-3 Fatty Acids (FISH OIL) 1200 MG CAPS   Oral   Take 1 capsule by mouth 2 (two)  times daily.         Marland Kitchen PARoxetine (PAXIL CR) 37.5 MG 24 hr tablet   Oral   Take 75 mg by mouth daily.          . rosuvastatin (CRESTOR) 10 MG tablet   Oral   Take 10 mg by mouth daily.         . traMADol (ULTRAM) 50 MG tablet   Oral   Take 1 tablet (50 mg total) by mouth every 6 (six) hours as needed for pain.   20 tablet   0   . traMADol (ULTRAM) 50 MG tablet   Oral   Take 1 tablet (50 mg total) by mouth every 6 (six) hours as needed for pain.   15 tablet   0     BP 148/78  Pulse 98  Temp(Src) 98 F (36.7 C) (Oral)  Resp 20  Ht 5\' 4"  (1.626 m)  Wt 258 lb (117.028 kg)  BMI 44.26 kg/m2  SpO2 94%  LMP 10/30/2012  Physical Exam  Nursing note and vitals reviewed. Constitutional: She appears well-developed and well-nourished. No distress.  HENT:  Head: Normocephalic and atraumatic.  Mouth/Throat: Oropharynx is clear and moist. No oropharyngeal exudate.  No signs of trauma to the head, normocephalic, atraumatic, oropharynx is clear with moist mucous membranes and a clear pharynx without asymmetry, exudate, hypertrophy or erythema. Nasal passages are clear with no rhinorrhea or turbinate swelling. Tympanic membranes are clear bilaterally  Eyes: Conjunctivae and EOM are normal. Pupils are equal, round, and reactive to light. Right eye exhibits no discharge. Left eye exhibits no discharge. No scleral icterus.  Neck: Normal range of motion. Neck supple. No JVD present. No thyromegaly present.  Cardiovascular: Normal rate, regular rhythm, normal heart sounds and intact distal pulses.  Exam reveals no gallop and no friction rub.   No murmur heard. Heart sounds are regular, pulse is in the normal range, no murmurs rubs or gallops, strong pulses at the radial arteries bilaterally, normal capillary refill, no JVD  Pulmonary/Chest: Effort normal. No respiratory distress. She has wheezes. She has no rales.  The patient is in no acute distress, has clear lung sounds, no rhonchi  or  rales, normal work of breathing, no accessory muscle use, no pallor or cyanosis noted.  Abdominal: Soft. Bowel sounds are normal. She exhibits no distension and no mass. There is no tenderness.  Musculoskeletal: Normal range of motion. She exhibits no edema and no tenderness.  Lymphadenopathy:    She has no cervical adenopathy.  Neurological: She is alert. Coordination normal.  Skin: Skin is warm and dry. No rash noted. No erythema.  Psychiatric: She has a normal mood and affect. Her behavior is normal.    ED Course  Procedures (including critical care time)  Labs Reviewed - No data to display No results found.   1. Bronchitis       MDM  Sat's 94%, with no fever or hypertension.  She has mild wheezing, non ischemic ECG and prominent respiratory tract signs with cough, sore throat and nasal congestion .  Will start albuterol and given zithromax - can f/u with [facility] where she gets her care, no indication for CXR or other lab work at this time.  Pt ambulated form triage to room without difficulty.  ED ECG REPORT  I personally interpreted this EKG   Date: 01/12/2013   Rate: 90  Rhythm: normal sinus rhythm  QRS Axis: normal  Intervals: normal  ST/T Wave abnormalities: normal  Conduction Disutrbances:none  Narrative Interpretation:   Old EKG Reviewed: c/w 10/04/12, QT slightly lengthened         Vida Roller, MD 01/12/13 1646

## 2013-01-12 NOTE — ED Notes (Signed)
Chest heavy, headache, feels weak,  Nausea, no vomiting.  Diarrhea today.  Cough green - yellow sputum.

## 2013-01-12 NOTE — ED Notes (Signed)
Instruction, prescriptions and f/u information given/reviewed; verbalizes understanding.  Left in c/o family for transport home.  A&ox4; in no distress.

## 2013-01-12 NOTE — ED Notes (Signed)
Dr Miller at bedside. 

## 2013-01-20 ENCOUNTER — Other Ambulatory Visit (HOSPITAL_COMMUNITY): Payer: Medicaid Other

## 2013-01-23 ENCOUNTER — Ambulatory Visit (HOSPITAL_COMMUNITY): Payer: Medicaid Other | Admitting: Oncology

## 2013-01-26 NOTE — Progress Notes (Signed)
Rescheduled

## 2013-01-27 ENCOUNTER — Ambulatory Visit (HOSPITAL_COMMUNITY): Payer: Self-pay | Admitting: Oncology

## 2013-04-28 ENCOUNTER — Encounter (HOSPITAL_COMMUNITY): Payer: Self-pay | Attending: Hematology and Oncology

## 2013-04-28 ENCOUNTER — Ambulatory Visit (HOSPITAL_COMMUNITY): Payer: Self-pay

## 2013-04-28 ENCOUNTER — Encounter (HOSPITAL_COMMUNITY): Payer: Self-pay

## 2013-04-28 VITALS — BP 114/73 | HR 94 | Temp 98.4°F | Resp 18 | Wt 258.6 lb

## 2013-04-28 DIAGNOSIS — N926 Irregular menstruation, unspecified: Secondary | ICD-10-CM

## 2013-04-28 DIAGNOSIS — C50119 Malignant neoplasm of central portion of unspecified female breast: Secondary | ICD-10-CM

## 2013-04-28 DIAGNOSIS — C50911 Malignant neoplasm of unspecified site of right female breast: Secondary | ICD-10-CM

## 2013-04-28 MED ORDER — TAMOXIFEN CITRATE 20 MG PO TABS: 20.0000 mg | ORAL_TABLET | Freq: Every day | ORAL | Status: DC

## 2013-04-28 NOTE — Patient Instructions (Addendum)
[facility] Discharge Instructions  RECOMMENDATIONS MADE BY THE CONSULTANT AND ANY TEST RESULTS WILL BE SENT TO YOUR REFERRING PHYSICIAN.  EXAM FINDINGS BY THE PHYSICIAN TODAY AND SIGNS OR SYMPTOMS TO REPORT TO CLINIC OR PRIMARY PHYSICIAN:    Exam is good  We are going to start you on Tamoxifen. You will take 1 tablet daily for 5 years.  If hot flashes get worse let us know. Muscle aches may occur. You nails could get brittle so use a good lotion.   We will see you back in 1 month to see how you are doing on the Tamoxifen. After that we will see you once every 6 months.   Even though you have a low PR %, the recommendations say that for over 1% to treat with Tamoxifen.    Thank you for choosing [facility] to provide your oncology and hematology care.  To afford each patient quality time with our providers, please arrive at least 15 minutes before your scheduled appointment time.  With your help, our goal is to use those 15 minutes to complete the necessary work-up to ensure our physicians have the information they need to help with your evaluation and healthcare recommendations.    Effective January 1st, 2014, we ask that you re-schedule your appointment with our physicians should you arrive 10 or more minutes late for your appointment.  We strive to give you quality time with our providers, and arriving late affects you and other patients whose appointments are after yours.    Again, thank you for choosing [facility].  Our hope is that these requests will decrease the amount of time that you wait before being seen by our physicians.       _____________________________________________________________  Should you have questions after your visit to [facility], please contact our office at [phone] between the hours of 8:30 a.m. and 5:00 p.m.  Voicemails left after 4:30 p.m. will not be returned until the following business  day.  For prescription refill requests, have your pharmacy contact our office with your prescription refill request.

## 2013-04-28 NOTE — Progress Notes (Signed)
[facility] [phone]   [phone]  OFFICE PROGRESS NOTE  No primary provider on file. No primary provider on file.  DIAGNOSIS: Stage II cancer of the breast on the right with a 3.8 cm mass, grade 3 invasive ductal carcinoma with clear margins no lymphovascular space invasion and single negative sentinel node was negative.ER receptor was 0, PR receptor was  2% with moderate staining only. Ki-67 marker high at 90%. HER2 was not amplified.    ONCOLOGIC HISTORY:she was diagnosed with invasive ductal carcinoma following a needle core biopsy done on 02/28/2011.post lumpectomy she had adjuvant chemotherapy with 4 cycles of AC Followed by 12 weekly Taxol chemotherapy was completed on 10/19/11. She also received breast irradiation completed in 12/31/2011.  INTERVAL HISTORY:   [name] 47 y.o. female returns to the clinic today for  For scheduled follow up of breast cancer.  She reports that her menses restarted around January of 2014 is associated with menstrual cramps with discomfort during intercourse.  Her cycles are irregular, reportedly she had a normal pelvic exam and Pap smear in March of 2014.  She denies any breast mass, no shortness of breath on any unintended weight loss. Her last mammogram was in December 2013 and this was read as birads 2 benign.    MEDICAL HISTORY: Past Medical History  Diagnosis Date  . Depression 06/22/2011  . GERD (gastroesophageal reflux disease) 06/22/2011  . DM (diabetes mellitus) 06/22/2011  . Hypercholesterolemia 06/22/2011  . Hypertension 06/22/2011  . Obesity 06/22/2011  . Hx of adenomatous colonic polyps 10/2007    4mm sigmoid tubular adenoma, FH colon cancer, mother in mid-50s  . Anxiety   . Breast cancer 05/22/2011    03/01/11, Stage 2, s/p lumpectomy, chemo/xrt    ALLERGIES:  has No Known Allergies.  MEDICATIONS:  Current Outpatient Prescriptions  Medication Sig Dispense Refill  . acetaminophen  (TYLENOL) 500 MG tablet Take 1,000 mg by mouth every 6 (six) hours as needed for pain.      Marland Kitchen esomeprazole (NEXIUM) 40 MG capsule Take 40 mg by mouth daily before breakfast.      . hydrochlorothiazide (HYDRODIURIL) 25 MG tablet Take 12.5 mg by mouth daily.      . metFORMIN (GLUCOPHAGE) 500 MG tablet Take 500 mg by mouth 2 (two) times daily.      . metoprolol tartrate (LOPRESSOR) 25 MG tablet Take 25 mg by mouth every morning.       Marland Kitchen PARoxetine (PAXIL CR) 37.5 MG 24 hr tablet Take 75 mg by mouth daily.       . rosuvastatin (CRESTOR) 10 MG tablet Take 10 mg by mouth at bedtime.       Marland Kitchen albuterol (PROVENTIL HFA;VENTOLIN HFA) 108 (90 BASE) MCG/ACT inhaler Inhale 1-2 puffs into the lungs every 6 (six) hours as needed for wheezing.  1 Inhaler  0  . aspirin EC 81 MG tablet Take 81 mg by mouth daily.      . Omega-3 Fatty Acids (FISH OIL) 1200 MG CAPS Take 1 capsule by mouth 2 (two) times daily.      . tamoxifen (NOLVADEX) 20 MG tablet Take 1 tablet (20 mg total) by mouth daily.  30 tablet  11  . [DISCONTINUED] gabapentin (NEURONTIN) 300 MG capsule Take 1 capsule (300 mg total) by mouth 3 (three) times daily.  100 capsule  3  . [DISCONTINUED] potassium chloride (KCL) 2 mEq/mL SOLN oral liquid Take 10 mL BID for 21 days,  then 10 mL daily, PO  300 mL  1   No current facility-administered medications for this visit.    SURGICAL HISTORY:  Past Surgical History  Procedure Laterality Date  . Back surg x2    . Cholecystectomy    . Mm breast stereo bx*l*r/s      rt.  . Mastectomy partial / lumpectomy      rt.  . Colonoscopy  11/03/07    4-mm sessile polyp removed/small internal hemorrhoids/tubular adenoma, random colon bx negative for microscopic colitis  . Portacath placement    . Port-a-cath removal  05/26/2012    Procedure: REMOVAL PORT-A-CATH;  Surgeon: [name], MD;  Location: [facility];  Service: General;  Laterality: N/A;  Minor Room     REVIEW OF SYSTEMS: 14 point review of system is as in  the history above otherwise negative.   PHYSICAL EXAMINATION:  Blood pressure 114/73, pulse 94, temperature 98.4 F (36.9 C), temperature source Oral, resp. rate 18, weight 258 lb 9.6 oz (117.3 kg). GENERAL: No distress, severely obese. SKIN:  No rashes or significant lesions  HEAD: Normocephalic, No masses, lesions, tenderness or abnormalities  EYES: Conjunctiva are pink and non-injected  ENT: External ears normal ,lips, buccal mucosa, and tongue normal and mucous membranes are moist  LYMPH: No palpable lymphadenopathy, in the neck axilla or supraclavicular areas.  BREAST:Normal left breast without mass .  The right breast shows post treatment changes but no suspicious mass or any evidence of recurrent. LUNGS: clear to auscultation , no crackles or wheezes HEART: regular rate & rhythm, no murmurs, no gallops, S1 normal and S2 normal  ABDOMEN: Abdomen soft, non-tender, normal bowel sounds, no masses or organomegaly and no hepatosplenomegaly palpable.  Exam limited by obesity. EXTREMITIES: No edema, no skin discoloration or tenderness NEURO: alert & oriented , no focal motor/sensory deficits.     LABORATORY DATA: Lab Results  Component Value Date   WBC 6.1 10/04/2012   HGB 12.7 10/04/2012   HCT 39.5 10/04/2012   MCV 89.0 10/04/2012   PLT 203 10/04/2012      Chemistry      Component Value Date/Time   NA 138 10/04/2012 1509   K 3.2* 10/04/2012 1509   CL 100 10/04/2012 1509   CO2 26 10/04/2012 1509   BUN 10 10/04/2012 1509   CREATININE 0.61 10/04/2012 1509      Component Value Date/Time   CALCIUM 9.1 10/04/2012 1509   ALKPHOS 59 08/11/2012 1601   AST 20 08/11/2012 1601   ALT 22 08/11/2012 1601   BILITOT 0.3 08/11/2012 1601       RADIOGRAPHIC STUDIES: No results found.   ASSESSMENT:  [name] has stage II breast cancer treated with surgical resection followed by adjuvant chemotherapy her radiation completed as above for breast conservation. She has no evidence of  disease at this this time.  Given 2% PR positivity I discussed ASCO guidelines with the patient recommending that adjuvant endocrine therapy be given for staining of ER or PR greater than 1%.   PLAN:  1. I prescribed tamoxifen 20 mg once a day.  I discussed the side effects with her including but not limited to flashes, arthralgia, small risk of DVT and well-differentiated uterine cancer.  I also discussed that patient should continue yearly GYN exam.  2. In the event that patient achieves menopause tamoxifen can be changed to aromatase inhibitor for additional the 2-3 years of therapy. On the whole make in 5 years of adjuvant endocrine therapy.  3. I irregular menstrual bleeding most likely relates to peri-menopause and possible relative estrogen deficiency which could also explain her dyspareunia.  Besides her pelvic exam and Pap smear was recently done in March and according to her was negative. For her menstrual cramps I advised judicious use of NSAID.  4. She will return to clinic in 1 month to see how she is tolerating tamoxifen.  I ordered CMP at her next visit to see if there is liver toxicity which could rarely occur with tamoxifen. Thereafter, all things considered, she will return to her 6 monthly follow up visits.  All questions were satisfactorily answered.She knows to call if she has any concern.  I spent 15 minutes counseling the patient face to face. The total time spent in the appointment was 30 minutes.   Sherral Hammers, MD FACP. Hematology/Oncology.

## 2013-05-26 ENCOUNTER — Other Ambulatory Visit (HOSPITAL_COMMUNITY): Payer: Self-pay

## 2013-05-29 ENCOUNTER — Ambulatory Visit (HOSPITAL_COMMUNITY): Payer: Self-pay | Admitting: Oncology

## 2013-06-08 ENCOUNTER — Ambulatory Visit (HOSPITAL_COMMUNITY): Payer: Self-pay

## 2013-11-05 ENCOUNTER — Other Ambulatory Visit (HOSPITAL_COMMUNITY): Payer: Self-pay | Admitting: *Deleted

## 2013-11-05 DIAGNOSIS — C50911 Malignant neoplasm of unspecified site of right female breast: Secondary | ICD-10-CM

## 2013-11-20 ENCOUNTER — Encounter (HOSPITAL_COMMUNITY): Payer: Self-pay | Admitting: Emergency Medicine

## 2013-11-20 ENCOUNTER — Emergency Department (HOSPITAL_COMMUNITY)
Admission: EM | Admit: 2013-11-20 | Discharge: 2013-11-21 | Disposition: A | Discharge status: Home / Self Care | Payer: PRIVATE HEALTH INSURANCE | Attending: Emergency Medicine | Admitting: Emergency Medicine

## 2013-11-20 DIAGNOSIS — E669 Obesity, unspecified: Secondary | ICD-10-CM | POA: Insufficient documentation

## 2013-11-20 DIAGNOSIS — F3289 Other specified depressive episodes: Secondary | ICD-10-CM | POA: Insufficient documentation

## 2013-11-20 DIAGNOSIS — K219 Gastro-esophageal reflux disease without esophagitis: Secondary | ICD-10-CM | POA: Insufficient documentation

## 2013-11-20 DIAGNOSIS — F411 Generalized anxiety disorder: Secondary | ICD-10-CM | POA: Insufficient documentation

## 2013-11-20 DIAGNOSIS — Z860100 Personal history of colon polyps, unspecified: Secondary | ICD-10-CM | POA: Insufficient documentation

## 2013-11-20 DIAGNOSIS — R197 Diarrhea, unspecified: Secondary | ICD-10-CM

## 2013-11-20 DIAGNOSIS — M2550 Pain in unspecified joint: Secondary | ICD-10-CM | POA: Insufficient documentation

## 2013-11-20 DIAGNOSIS — Z3202 Encounter for pregnancy test, result negative: Secondary | ICD-10-CM | POA: Insufficient documentation

## 2013-11-20 DIAGNOSIS — I10 Essential (primary) hypertension: Secondary | ICD-10-CM | POA: Insufficient documentation

## 2013-11-20 DIAGNOSIS — E7800 Pure hypercholesterolemia, unspecified: Secondary | ICD-10-CM | POA: Insufficient documentation

## 2013-11-20 DIAGNOSIS — R109 Unspecified abdominal pain: Secondary | ICD-10-CM | POA: Insufficient documentation

## 2013-11-20 DIAGNOSIS — Z7982 Long term (current) use of aspirin: Secondary | ICD-10-CM | POA: Insufficient documentation

## 2013-11-20 DIAGNOSIS — F329 Major depressive disorder, single episode, unspecified: Secondary | ICD-10-CM | POA: Insufficient documentation

## 2013-11-20 DIAGNOSIS — Z79899 Other long term (current) drug therapy: Secondary | ICD-10-CM | POA: Insufficient documentation

## 2013-11-20 DIAGNOSIS — E119 Type 2 diabetes mellitus without complications: Secondary | ICD-10-CM | POA: Insufficient documentation

## 2013-11-20 DIAGNOSIS — Z853 Personal history of malignant neoplasm of breast: Secondary | ICD-10-CM | POA: Insufficient documentation

## 2013-11-20 LAB — CBC WITH DIFFERENTIAL/PLATELET
Basophils Absolute: 0 10*3/uL (ref 0.0–0.1)
Basophils Relative: 0 % (ref 0–1)
Eosinophils Absolute: 0.1 10*3/uL (ref 0.0–0.7)
Eosinophils Relative: 2 % (ref 0–5)
HCT: 39.8 % (ref 36.0–46.0)
HEMOGLOBIN: 12.6 g/dL (ref 12.0–15.0)
Lymphocytes Relative: 27 % (ref 12–46)
Lymphs Abs: 1.9 10*3/uL (ref 0.7–4.0)
MCH: 27.9 pg (ref 26.0–34.0)
MCHC: 31.7 g/dL (ref 30.0–36.0)
MCV: 88.2 fL (ref 78.0–100.0)
Monocytes Absolute: 0.5 10*3/uL (ref 0.1–1.0)
Monocytes Relative: 7 % (ref 3–12)
NEUTROS PCT: 64 % (ref 43–77)
Neutro Abs: 4.4 10*3/uL (ref 1.7–7.7)
Platelets: 225 10*3/uL (ref 150–400)
RBC: 4.51 MIL/uL (ref 3.87–5.11)
RDW: 15.2 % (ref 11.5–15.5)
WBC: 6.9 10*3/uL (ref 4.0–10.5)

## 2013-11-20 LAB — BASIC METABOLIC PANEL
BUN: 10 mg/dL (ref 6–23)
CO2: 32 mEq/L (ref 19–32)
CREATININE: 0.89 mg/dL (ref 0.50–1.10)
Calcium: 9.6 mg/dL (ref 8.4–10.5)
Chloride: 101 mEq/L (ref 96–112)
GFR calc Af Amer: 87 mL/min — ABNORMAL LOW (ref >90)
GFR, EST NON AFRICAN AMERICAN: 75 mL/min — AB (ref >90)
Glucose, Bld: 142 mg/dL — ABNORMAL HIGH (ref 70–99)
Potassium: 3 mEq/L — ABNORMAL LOW (ref 3.7–5.3)
Sodium: 143 mEq/L (ref 137–147)

## 2013-11-20 LAB — URINALYSIS, ROUTINE W REFLEX MICROSCOPIC
Glucose, UA: NEGATIVE mg/dL
HGB URINE DIPSTICK: NEGATIVE
Ketones, ur: NEGATIVE mg/dL
Leukocytes, UA: NEGATIVE
Nitrite: NEGATIVE
PH: 6 (ref 5.0–8.0)
Protein, ur: NEGATIVE mg/dL
Specific Gravity, Urine: 1.025 (ref 1.005–1.030)
Urobilinogen, UA: 0.2 mg/dL (ref 0.0–1.0)

## 2013-11-20 LAB — GLUCOSE, CAPILLARY: GLUCOSE-CAPILLARY: 132 mg/dL — AB (ref 70–99)

## 2013-11-20 LAB — PREGNANCY, URINE: Preg Test, Ur: NEGATIVE

## 2013-11-20 MED ORDER — POTASSIUM CHLORIDE CRYS ER 20 MEQ PO TBCR
40.0000 meq | EXTENDED_RELEASE_TABLET | Freq: Once | ORAL | Status: AC
  Administered 2013-11-21: 40 meq via ORAL
  Filled 2013-11-20: qty 2

## 2013-11-20 MED ORDER — SODIUM CHLORIDE 0.9 % IV SOLN: 1000.0000 mL | INTRAVENOUS | Status: DC

## 2013-11-20 MED ORDER — ONDANSETRON HCL 4 MG PO TABS
4.0000 mg | ORAL_TABLET | Freq: Once | ORAL | Status: AC
  Administered 2013-11-21: 4 mg via ORAL
  Filled 2013-11-20: qty 1

## 2013-11-20 MED ORDER — MORPHINE SULFATE 4 MG/ML IJ SOLN
8.0000 mg | Freq: Once | INTRAMUSCULAR | Status: AC
  Administered 2013-11-21: 8 mg via INTRAVENOUS
  Filled 2013-11-20: qty 2

## 2013-11-20 MED ORDER — SODIUM CHLORIDE 0.9 % IV SOLN
1000.0000 mL | Freq: Once | INTRAVENOUS | Status: AC
  Administered 2013-11-21: 1000 mL via INTRAVENOUS

## 2013-11-20 NOTE — ED Notes (Signed)
Pt c/o upper abd pain and diarrhea x 3 days.  Denies n/v or fever.

## 2013-11-21 MED ORDER — ONDANSETRON HCL 4 MG PO TABS: 4.0000 mg | ORAL_TABLET | Freq: Four times a day (QID) | ORAL | Status: DC

## 2013-11-21 MED ORDER — HYDROCODONE-ACETAMINOPHEN 5-325 MG PO TABS: ORAL_TABLET | ORAL | Status: DC

## 2013-11-21 NOTE — Discharge Instructions (Signed)
Your potassium was low at 3.0, you received oral potassium here in the emergency department. Please increase foods high in potassium at home. The remainder of your labs in your urine test are all within normal limits. Suspect that you have a stomach virus. Please increase fluids. Please see your primary physician or return to the emergency department if not improving, or there any changes in your condition. Please use Norco for pain, may use Zofran for nausea if needed. Norco may cause drowsiness, please use with caution. Diarrhea Diarrhea is frequent loose and watery bowel movements. It can cause you to feel weak and dehydrated. Dehydration can cause you to become tired and thirsty, have a dry mouth, and have decreased urination that often is dark yellow. Diarrhea is a sign of another problem, most often an infection that will not last long. In most cases, diarrhea typically lasts 2 3 days. However, it can last longer if it is a sign of something more serious. It is important to treat your diarrhea as directed by your caregive to lessen or prevent future episodes of diarrhea. CAUSES  Some common causes include:  Gastrointestinal infections caused by viruses, bacteria, or parasites.  Food poisoning or food allergies.  Certain medicines, such as antibiotics, chemotherapy, and laxatives.  Artificial sweeteners and fructose.  Digestive disorders. HOME CARE INSTRUCTIONS  Ensure adequate fluid intake (hydration): have 1 cup (8 oz) of fluid for each diarrhea episode. Avoid fluids that contain simple sugars or sports drinks, fruit juices, whole milk products, and sodas. Your urine should be clear or pale yellow if you are drinking enough fluids. Hydrate with an oral rehydration solution that you can purchase at pharmacies, retail stores, and online. You can prepare an oral rehydration solution at home by mixing the following ingredients together:    tsp table salt.   tsp baking soda.   tsp salt  substitute containing potassium chloride.  1  tablespoons sugar.  1 L (34 oz) of water.  Certain foods and beverages may increase the speed at which food moves through the gastrointestinal (GI) tract. These foods and beverages should be avoided and include:  Caffeinated and alcoholic beverages.  High-fiber foods, such as raw fruits and vegetables, nuts, seeds, and whole grain breads and cereals.  Foods and beverages sweetened with sugar alcohols, such as xylitol, sorbitol, and mannitol.  Some foods may be well tolerated and may help thicken stool including:  Starchy foods, such as rice, toast, pasta, low-sugar cereal, oatmeal, grits, baked potatoes, crackers, and bagels.  Bananas.  Applesauce.  Add probiotic-rich foods to help increase healthy bacteria in the GI tract, such as yogurt and fermented milk products.  Wash your hands well after each diarrhea episode.  Only take over-the-counter or prescription medicines as directed by your caregiver.  Take a warm bath to relieve any burning or pain from frequent diarrhea episodes. SEEK IMMEDIATE MEDICAL CARE IF:   You are unable to keep fluids down.  You have persistent vomiting.  You have blood in your stool, or your stools are black and tarry.  You do not urinate in 6 8 hours, or there is only a small amount of very dark urine.  You have abdominal pain that increases or localizes.  You have weakness, dizziness, confusion, or lightheadedness.  You have a severe headache.  Your diarrhea gets worse or does not get better.  You have a fever or persistent symptoms for more than 2 3 days.  You have a fever and your symptoms suddenly  get worse. MAKE SURE YOU:   Understand these instructions.  Will watch your condition.  Will get help right away if you are not doing well or get worse. Document Released: 10/26/2002 Document Revised: 10/22/2012 Document Reviewed: 07/13/2012 [facility] Patient Information 2014 [place],  [place].

## 2013-11-21 NOTE — ED Provider Notes (Signed)
Medical screening examination/treatment/procedure(s) were performed by non-physician practitioner and as supervising physician I was immediately available for consultation/collaboration.  EKG Interpretation   None         Wynetta Fines, MD 11/21/13 [phone]

## 2013-11-21 NOTE — ED Provider Notes (Signed)
CSN: [id number]     Arrival date & time 11/20/13  1714 History   First MD Initiated Contact with Patient 11/20/13 2256     Chief Complaint  Patient presents with  . Abdominal Pain  . Diarrhea   (Consider location/radiation/quality/duration/timing/severity/associated sxs/prior Treatment) HPI Comments: The patient is a 48 year old female who presents to the emergency department with upper abdomen pain and diarrhea. Patient states that approximately 3 days ago she began to develop some diarrhea. She later developed upper abdomen area pain. She denies any high fevers. She denies any nausea or vomiting. She denies any blood in the diarrhea stools. She states that on one of the 3 days she had as many as 5 diarrhea stools. They have been declining since that time. 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 There's been no recent change in diet. His been no change in medications.  Patient is a 48 y.o. female presenting with abdominal pain and diarrhea. The history is provided by the patient.  Abdominal Pain Associated symptoms: diarrhea   Associated symptoms: no chest pain, no cough, no dysuria, no hematuria, no nausea, no shortness of breath and no vomiting   Diarrhea Associated symptoms: abdominal pain and arthralgias   Associated symptoms: no vomiting     Past Medical History  Diagnosis Date  . Depression 06/22/2011  . GERD (gastroesophageal reflux disease) 06/22/2011  . DM (diabetes mellitus) 06/22/2011  . Hypercholesterolemia 06/22/2011  . Hypertension 06/22/2011  . Obesity 06/22/2011  . Hx of adenomatous colonic polyps 10/2007    75mm sigmoid tubular adenoma, FH colon cancer, mother in mid-50s  . Anxiety   . Breast cancer 05/22/2011    03/01/11, Stage 2, s/p lumpectomy, chemo/xrt   Past Surgical History  Procedure Laterality Date  . Back surg x2    . Cholecystectomy    . Mm breast stereo bx*l*r/s     rt.  . Mastectomy partial / lumpectomy      rt.  . Colonoscopy  11/03/07    4-mm sessile polyp removed/small internal hemorrhoids/tubular adenoma, random colon bx negative for microscopic colitis  . Portacath placement    . Port-a-cath removal  05/26/2012    Procedure: REMOVAL PORT-A-CATH;  Surgeon: Jamesetta So, MD;  Location: [facility];  Service: General;  Laterality: N/A;  Minor Room   Family History  Problem Relation Age of Onset  . Colon cancer Mother     mid-50s, died of metastatic disease  . Cancer Mother   . Coronary artery disease Mother   . Diabetes type I Mother   . Coronary artery disease Father   . Diabetes Father   . Diabetes type I Father   . Hypertension Father   . Bipolar disorder Sister   . Coronary artery disease Brother   . Hypertension Brother    History  Substance Use Topics  . Smoking status: Never Smoker   . Smokeless tobacco: Never Used  . Alcohol Use: No   OB History   Grav Para Term Preterm Abortions TAB SAB Ect Mult Living                 Review of Systems  Constitutional: Negative for activity change.       All ROS Neg except as noted in HPI  HENT: Negative for nosebleeds.   Eyes: Negative for photophobia and discharge.  Respiratory: Negative for cough, shortness of breath and wheezing.  Cardiovascular: Negative for chest pain and palpitations.  Gastrointestinal: Positive for abdominal pain and diarrhea. Negative for nausea, vomiting and blood in stool.  Genitourinary: Negative for dysuria, frequency and hematuria.  Musculoskeletal: Positive for arthralgias. Negative for back pain and neck pain.  Skin: Negative.   Neurological: Negative for dizziness, seizures and speech difficulty.  Psychiatric/Behavioral: Negative for hallucinations and confusion.       Depression    Allergies  Review of patient's allergies indicates no known allergies.  Home Medications   Current Outpatient Rx  Name  Route  Sig  Dispense  Refill  . acetaminophen  (TYLENOL) 500 MG tablet   Oral   Take 1,000 mg by mouth every 6 (six) hours as needed for pain.         Marland Kitchen albuterol (PROVENTIL HFA;VENTOLIN HFA) 108 (90 BASE) MCG/ACT inhaler   Inhalation   Inhale 1-2 puffs into the lungs every 6 (six) hours as needed for wheezing.   1 Inhaler   0   . aspirin EC 81 MG tablet   Oral   Take 81 mg by mouth daily.         Marland Kitchen esomeprazole (NEXIUM) 40 MG capsule   Oral   Take 40 mg by mouth daily before breakfast.         . hydrochlorothiazide (HYDRODIURIL) 25 MG tablet   Oral   Take 12.5 mg by mouth daily.         . metFORMIN (GLUCOPHAGE) 500 MG tablet   Oral   Take 500 mg by mouth 2 (two) times daily.         . metoprolol tartrate (LOPRESSOR) 25 MG tablet   Oral   Take 25 mg by mouth every morning.          . Omega-3 Fatty Acids (FISH OIL) 1200 MG CAPS   Oral   Take 1 capsule by mouth 2 (two) times daily.         Marland Kitchen PARoxetine (PAXIL CR) 37.5 MG 24 hr tablet   Oral   Take 75 mg by mouth daily.          . rosuvastatin (CRESTOR) 10 MG tablet   Oral   Take 10 mg by mouth at bedtime.          . tamoxifen (NOLVADEX) 20 MG tablet   Oral   Take 1 tablet (20 mg total) by mouth daily.   30 tablet   11    BP 116/74  Pulse 82  Temp(Src) 98.4 F (36.9 C) (Oral)  Resp 20  Ht 5\' 4"  (1.626 m)  Wt 240 lb (108.863 kg)  BMI 41.18 kg/m2  SpO2 97%  LMP 08/26/2013 Physical Exam  Nursing note and vitals reviewed. Constitutional: She is oriented to person, place, and time. She appears well-developed and well-nourished.  Non-toxic appearance.  HENT:  Head: Normocephalic.  Right Ear: Tympanic membrane and external ear normal.  Left Ear: Tympanic membrane and external ear normal.  Eyes: EOM and lids are normal. Pupils are equal, round, and reactive to light.  Neck: Normal range of motion. Neck supple. Carotid bruit is not present.  Cardiovascular: Normal rate, regular rhythm, normal heart sounds, intact distal pulses and normal  pulses.   Pulmonary/Chest: Breath sounds normal. No respiratory distress.  Abdominal: Soft. Bowel sounds are normal. She exhibits no mass. There is tenderness. There is no guarding.  Moderate epigastric and periumbilical pain. The abdomen is soft, and nondistended. Auscultation reveals gaseous bowel sounds throughout the abdomen. No  CVA tenderness.  Musculoskeletal: Normal range of motion.  Lymphadenopathy:       Head (right side): No submandibular adenopathy present.       Head (left side): No submandibular adenopathy present.    She has no cervical adenopathy.  Neurological: She is alert and oriented to person, place, and time. She has normal strength. No cranial nerve deficit or sensory deficit.  Skin: Skin is warm and dry.  Psychiatric: She has a normal mood and affect. Her speech is normal.    ED Course  Procedures (including critical care time) Labs Review Labs Reviewed  BASIC METABOLIC PANEL - Abnormal; Notable for the following:    Potassium 3.0 (*)    Glucose, Bld 142 (*)    GFR calc non Af Amer 75 (*)    GFR calc Af Amer 87 (*)    All other components within normal limits  URINALYSIS, ROUTINE W REFLEX MICROSCOPIC - Abnormal; Notable for the following:    APPearance HAZY (*)    Bilirubin Urine SMALL (*)    All other components within normal limits  GLUCOSE, CAPILLARY - Abnormal; Notable for the following:    Glucose-Capillary 132 (*)    All other components within normal limits  CBC WITH DIFFERENTIAL  PREGNANCY, URINE   Imaging Review No results found.  EKG Interpretation   None       MDM  No diagnosis found. *I have reviewed nursing notes, vital signs, and all appropriate lab and imaging results for this patient.**  Vital signs are well within normal limits. Pulse oximetry is 97% on room air. Within normal limits by my interpretation. Complete blood count is well within normal limits. Basic metabolic panel reveals the potassium to be low at 3.0 a glucose to be  slightly elevated at 142 otherwise well within normal limits. Urinalysis shows a hazy specimen with a specific gravity normal at 1.025. The urinalysis is negative. The urine pregnancy is negative.  The patient tolerated IV fluids without problem. The pain has been significantly improved with IV morphine his been no nausea are reported since the patient has been in the emergency department. The patient was given oral potassium 40 mEq and she tolerated this without problem.   Prescription for Norco and Zofran given to the patient. Patient is advised to increase fluids, increase foods high in potassium. She is to follow with her primary physician for recheck of her potassium. She is to return to the emergency department if any emergent changes, problems, or concerns.  Lenox Ahr, PA-C 11/21/13 [phone]

## 2013-11-25 ENCOUNTER — Ambulatory Visit (HOSPITAL_COMMUNITY)
Admission: RE | Admit: 2013-11-25 | Discharge: 2013-11-25 | Disposition: A | Discharge status: Home / Self Care | Payer: PRIVATE HEALTH INSURANCE | Source: Ambulatory Visit | Attending: *Deleted | Admitting: *Deleted

## 2013-11-25 DIAGNOSIS — C50911 Malignant neoplasm of unspecified site of right female breast: Secondary | ICD-10-CM

## 2013-11-25 DIAGNOSIS — Z853 Personal history of malignant neoplasm of breast: Secondary | ICD-10-CM | POA: Insufficient documentation

## 2014-03-08 ENCOUNTER — Ambulatory Visit (INDEPENDENT_AMBULATORY_CARE_PROVIDER_SITE_OTHER): Payer: BC Managed Care – PPO | Admitting: Family Medicine

## 2014-03-08 ENCOUNTER — Encounter: Payer: Self-pay | Admitting: Family Medicine

## 2014-03-08 VITALS — BP 136/90 | Temp 99.1°F | Ht 64.0 in | Wt 250.0 lb

## 2014-03-08 DIAGNOSIS — J683 Other acute and subacute respiratory conditions due to chemicals, gases, fumes and vapors: Secondary | ICD-10-CM

## 2014-03-08 DIAGNOSIS — N939 Abnormal uterine and vaginal bleeding, unspecified: Secondary | ICD-10-CM

## 2014-03-08 DIAGNOSIS — J45909 Unspecified asthma, uncomplicated: Secondary | ICD-10-CM

## 2014-03-08 HISTORY — DX: Abnormal uterine and vaginal bleeding, unspecified: N93.9

## 2014-03-08 MED ORDER — CETIRIZINE HCL 10 MG PO TABS: 10.0000 mg | ORAL_TABLET | Freq: Every day | ORAL | Status: DC

## 2014-03-08 MED ORDER — ALBUTEROL SULFATE HFA 108 (90 BASE) MCG/ACT IN AERS: 2.0000 | INHALATION_SPRAY | RESPIRATORY_TRACT | Status: DC | PRN

## 2014-03-08 MED ORDER — CEFDINIR 300 MG PO CAPS: 300.0000 mg | ORAL_CAPSULE | Freq: Two times a day (BID) | ORAL | Status: DC

## 2014-03-08 MED ORDER — PREDNISONE 20 MG PO TABS: ORAL_TABLET | ORAL | Status: DC

## 2014-03-08 NOTE — Progress Notes (Signed)
   Subjective:    Patient ID: [name], female    DOB: 1966-08-11, 48 y.o.   MRN: [id number]  Cough This is a new problem. The current episode started in the past 7 days. The cough is productive of purulent sputum. Associated symptoms include chest pain, ear congestion, a fever, headaches, myalgias, nasal congestion, postnasal drip, rhinorrhea, a sore throat, shortness of breath and wheezing. Exacerbated by: Smoke, activity. Risk factors for lung disease include smoking/tobacco exposure. She has tried OTC cough suppressant for the symptoms. The treatment provided no relief. Her past medical history is significant for bronchitis.   Congestion and wheezing  Does not smoke  Positive exposure, to others who are sick.      Review of Systems  Constitutional: Positive for fever.  HENT: Positive for postnasal drip, rhinorrhea and sore throat.   Respiratory: Positive for cough, shortness of breath and wheezing.   Cardiovascular: Positive for chest pain.  Musculoskeletal: Positive for myalgias.  Neurological: Positive for headaches.   ROS otherwise negative     Objective:   Physical Exam  Alert mild malaise HET moderate his congestion facial neck supple. Lungs bilateral wheezes bronchial cough heart regular in rhythm.      Assessment & Plan:  Impression acute bronchitis with secondary reactive airways plan prednisone taper. Antibiotics prescribed. Ventolin when necessary. WSL

## 2014-03-09 ENCOUNTER — Encounter (HOSPITAL_COMMUNITY): Payer: Self-pay | Admitting: Emergency Medicine

## 2014-03-09 ENCOUNTER — Emergency Department (HOSPITAL_COMMUNITY)
Admission: EM | Admit: 2014-03-09 | Discharge: 2014-03-09 | Disposition: A | Discharge status: Home / Self Care | Payer: BC Managed Care – PPO | Attending: Emergency Medicine | Admitting: Emergency Medicine

## 2014-03-09 DIAGNOSIS — F411 Generalized anxiety disorder: Secondary | ICD-10-CM | POA: Insufficient documentation

## 2014-03-09 DIAGNOSIS — N925 Other specified irregular menstruation: Secondary | ICD-10-CM | POA: Insufficient documentation

## 2014-03-09 DIAGNOSIS — Z3202 Encounter for pregnancy test, result negative: Secondary | ICD-10-CM | POA: Insufficient documentation

## 2014-03-09 DIAGNOSIS — Z860100 Personal history of colon polyps, unspecified: Secondary | ICD-10-CM | POA: Insufficient documentation

## 2014-03-09 DIAGNOSIS — E7800 Pure hypercholesterolemia, unspecified: Secondary | ICD-10-CM | POA: Insufficient documentation

## 2014-03-09 DIAGNOSIS — Z853 Personal history of malignant neoplasm of breast: Secondary | ICD-10-CM | POA: Insufficient documentation

## 2014-03-09 DIAGNOSIS — F3289 Other specified depressive episodes: Secondary | ICD-10-CM | POA: Insufficient documentation

## 2014-03-09 DIAGNOSIS — K219 Gastro-esophageal reflux disease without esophagitis: Secondary | ICD-10-CM | POA: Insufficient documentation

## 2014-03-09 DIAGNOSIS — Z7982 Long term (current) use of aspirin: Secondary | ICD-10-CM | POA: Insufficient documentation

## 2014-03-09 DIAGNOSIS — Z79899 Other long term (current) drug therapy: Secondary | ICD-10-CM | POA: Insufficient documentation

## 2014-03-09 DIAGNOSIS — N938 Other specified abnormal uterine and vaginal bleeding: Secondary | ICD-10-CM

## 2014-03-09 DIAGNOSIS — E119 Type 2 diabetes mellitus without complications: Secondary | ICD-10-CM | POA: Insufficient documentation

## 2014-03-09 DIAGNOSIS — D649 Anemia, unspecified: Secondary | ICD-10-CM

## 2014-03-09 DIAGNOSIS — Z8739 Personal history of other diseases of the musculoskeletal system and connective tissue: Secondary | ICD-10-CM | POA: Insufficient documentation

## 2014-03-09 DIAGNOSIS — E669 Obesity, unspecified: Secondary | ICD-10-CM | POA: Insufficient documentation

## 2014-03-09 DIAGNOSIS — F329 Major depressive disorder, single episode, unspecified: Secondary | ICD-10-CM | POA: Insufficient documentation

## 2014-03-09 DIAGNOSIS — N949 Unspecified condition associated with female genital organs and menstrual cycle: Secondary | ICD-10-CM | POA: Insufficient documentation

## 2014-03-09 DIAGNOSIS — I10 Essential (primary) hypertension: Secondary | ICD-10-CM | POA: Insufficient documentation

## 2014-03-09 HISTORY — DX: Other intervertebral disc degeneration, lumbar region: M51.36

## 2014-03-09 HISTORY — DX: Other intervertebral disc degeneration, lumbar region without mention of lumbar back pain or lower extremity pain: M51.369

## 2014-03-09 LAB — CBC WITH DIFFERENTIAL/PLATELET
BASOS ABS: 0 10*3/uL (ref 0.0–0.1)
Basophils Relative: 0 % (ref 0–1)
Eosinophils Absolute: 0.2 10*3/uL (ref 0.0–0.7)
Eosinophils Relative: 3 % (ref 0–5)
HEMATOCRIT: 36.1 % (ref 36.0–46.0)
HEMOGLOBIN: 11.6 g/dL — AB (ref 12.0–15.0)
LYMPHS PCT: 25 % (ref 12–46)
Lymphs Abs: 1.7 10*3/uL (ref 0.7–4.0)
MCH: 28.2 pg (ref 26.0–34.0)
MCHC: 32.1 g/dL (ref 30.0–36.0)
MCV: 87.6 fL (ref 78.0–100.0)
MONO ABS: 0.5 10*3/uL (ref 0.1–1.0)
MONOS PCT: 8 % (ref 3–12)
Neutro Abs: 4.4 10*3/uL (ref 1.7–7.7)
Neutrophils Relative %: 65 % (ref 43–77)
Platelets: 228 10*3/uL (ref 150–400)
RBC: 4.12 MIL/uL (ref 3.87–5.11)
RDW: 16.3 % — ABNORMAL HIGH (ref 11.5–15.5)
WBC: 6.9 10*3/uL (ref 4.0–10.5)

## 2014-03-09 LAB — WET PREP, GENITAL
Trich, Wet Prep: NONE SEEN
WBC, Wet Prep HPF POC: NONE SEEN
Yeast Wet Prep HPF POC: NONE SEEN

## 2014-03-09 LAB — HEMOGLOBIN AND HEMATOCRIT, BLOOD
HEMATOCRIT: 34.1 % — AB (ref 36.0–46.0)
Hemoglobin: 10.8 g/dL — ABNORMAL LOW (ref 12.0–15.0)

## 2014-03-09 LAB — TYPE AND SCREEN
ABO/RH(D): O POS
Antibody Screen: NEGATIVE

## 2014-03-09 LAB — LACTIC ACID, PLASMA: Lactic Acid, Venous: 3.1 mmol/L — ABNORMAL HIGH (ref 0.5–2.2)

## 2014-03-09 LAB — POC URINE PREG, ED: Preg Test, Ur: NEGATIVE

## 2014-03-09 MED ORDER — ESTROGENS CONJUGATED 25 MG IJ SOLR
25.0000 mg | Freq: Once | INTRAMUSCULAR | Status: AC
  Administered 2014-03-09: 25 mg via INTRAVENOUS
  Filled 2014-03-09: qty 25

## 2014-03-09 MED ORDER — OXYCODONE-ACETAMINOPHEN 5-325 MG PO TABS: 1.0000 | ORAL_TABLET | ORAL | Status: DC | PRN

## 2014-03-09 MED ORDER — SODIUM CHLORIDE 0.9 % IV SOLN: 1000.0000 mL | INTRAVENOUS | Status: DC

## 2014-03-09 MED ORDER — SODIUM CHLORIDE 0.9 % IV SOLN
1000.0000 mL | Freq: Once | INTRAVENOUS | Status: AC
  Administered 2014-03-09: 1000 mL via INTRAVENOUS

## 2014-03-09 MED ORDER — NAPROXEN 250 MG PO TABS: 250.0000 mg | ORAL_TABLET | Freq: Two times a day (BID) | ORAL | Status: DC

## 2014-03-09 MED ORDER — ONDANSETRON HCL 4 MG/2ML IJ SOLN
4.0000 mg | Freq: Once | INTRAMUSCULAR | Status: AC
  Administered 2014-03-09: 4 mg via INTRAMUSCULAR
  Filled 2014-03-09: qty 2

## 2014-03-09 MED ORDER — SODIUM CHLORIDE 0.9 % IV BOLUS (SEPSIS): 1000.0000 mL | Freq: Once | INTRAVENOUS | Status: DC

## 2014-03-09 MED ORDER — STERILE WATER FOR INJECTION IJ SOLN
INTRAMUSCULAR | Status: AC
  Administered 2014-03-09: 5 mL
  Filled 2014-03-09: qty 10

## 2014-03-09 MED ORDER — MEDROXYPROGESTERONE ACETATE 10 MG PO TABS
10.0000 mg | ORAL_TABLET | Freq: Once | ORAL | Status: AC
  Administered 2014-03-09: 10 mg via ORAL
  Filled 2014-03-09: qty 1

## 2014-03-09 MED ORDER — MEDROXYPROGESTERONE ACETATE 10 MG PO TABS
ORAL_TABLET | ORAL | Status: AC
  Filled 2014-03-09: qty 1

## 2014-03-09 MED ORDER — MEDROXYPROGESTERONE ACETATE 10 MG PO TABS: 10.0000 mg | ORAL_TABLET | Freq: Every day | ORAL | Status: DC

## 2014-03-09 MED ORDER — OXYCODONE-ACETAMINOPHEN 5-325 MG PO TABS
1.0000 | ORAL_TABLET | Freq: Once | ORAL | Status: AC
  Administered 2014-03-09: 1 via ORAL
  Filled 2014-03-09: qty 1

## 2014-03-09 MED ORDER — MEGESTROL ACETATE 40 MG PO TABS
40.0000 mg | ORAL_TABLET | Freq: Every day | ORAL | Status: DC
Start: 2014-03-09 — End: 2014-03-12

## 2014-03-09 NOTE — ED Provider Notes (Addendum)
CSN: [id number]     Arrival date & time 03/09/14  0419 History   First MD Initiated Contact with Patient 03/09/14 0542     Chief complaint: Vaginal bleeding  (Consider location/radiation/quality/duration/timing/severity/associated sxs/prior Treatment) The history is provided by the patient.   48 year old female has been having some suprapubic cramps for about the last week. Yesterday, she started having vaginal bleeding which was much heavier than normal. She has passed several large clots. She has had irregular menses for the last several years and she thinks her last period before this episode of bleeding was about 2 months ago. Her cramping was 10/10 at its worst, and is now 5/10. She denies nausea or vomiting. She denies fever, chills, sweats. She does relate that she had radiation and chemotherapy for breast cancer.  Past Medical History  Diagnosis Date  . Depression 06/22/2011  . GERD (gastroesophageal reflux disease) 06/22/2011  . DM (diabetes mellitus) 06/22/2011  . Hypercholesterolemia 06/22/2011  . Hypertension 06/22/2011  . Obesity 06/22/2011  . Hx of adenomatous colonic polyps 10/2007    72mm sigmoid tubular adenoma, FH colon cancer, mother in mid-50s  . Anxiety   . Breast cancer 05/22/2011    03/01/11, Stage 2, s/p lumpectomy, chemo/xrt   Past Surgical History  Procedure Laterality Date  . Back surg x2    . Cholecystectomy    . Mm breast stereo bx*l*r/s      rt.  . Mastectomy partial / lumpectomy      rt.  . Colonoscopy  11/03/07    4-mm sessile polyp removed/small internal hemorrhoids/tubular adenoma, random colon bx negative for microscopic colitis  . Portacath placement    . Port-a-cath removal  05/26/2012    Procedure: REMOVAL PORT-A-CATH;  Surgeon: Jamesetta So, MD;  Location: [facility];  Service: General;  Laterality: N/A;  Minor Room   Family History  Problem Relation Age of Onset  . Colon cancer Mother     mid-50s, died of metastatic disease  . Cancer Mother   . Coronary  artery disease Mother   . Diabetes type I Mother   . Coronary artery disease Father   . Diabetes Father   . Diabetes type I Father   . Hypertension Father   . Bipolar disorder Sister   . Coronary artery disease Brother   . Hypertension Brother    History  Substance Use Topics  . Smoking status: Never Smoker   . Smokeless tobacco: Never Used  . Alcohol Use: No   OB History   Grav Para Term Preterm Abortions TAB SAB Ect Mult Living                 Review of Systems  All other systems reviewed and are negative.     Allergies  Review of patient's allergies indicates no known allergies.  Home Medications   Prior to Admission medications   Medication Sig Start Date End Date Taking? Authorizing Provider  acetaminophen (TYLENOL) 500 MG tablet Take 1,000 mg by mouth every 6 (six) hours as needed for pain.    Historical Provider, MD  albuterol (PROVENTIL HFA;VENTOLIN HFA) 108 (90 BASE) MCG/ACT inhaler Inhale 2 puffs into the lungs every 4 (four) hours as needed for wheezing. 03/08/14   Mikey Kirschner, MD  aspirin EC 81 MG tablet Take 81 mg by mouth daily.    Historical Provider, MD  cefdinir (OMNICEF) 300 MG capsule Take 1 capsule (300 mg total) by mouth 2 (two) times daily. 03/08/14   Grace Bushy  Wolfgang Phoenix, MD  cetirizine (ZYRTEC) 10 MG tablet Take 1 tablet (10 mg total) by mouth at bedtime. 03/08/14   Mikey Kirschner, MD  esomeprazole (NEXIUM) 40 MG capsule Take 40 mg by mouth daily before breakfast.    Historical Provider, MD  hydrochlorothiazide (HYDRODIURIL) 25 MG tablet Take 12.5 mg by mouth daily.    Historical Provider, MD  HYDROcodone-acetaminophen (NORCO) 5-325 MG per tablet 1 or 2 po q4h prn pain. 11/21/13   Lenox Ahr, PA-C  metFORMIN (GLUCOPHAGE) 500 MG tablet Take 500 mg by mouth 2 (two) times daily.    Historical Provider, MD  metoprolol tartrate (LOPRESSOR) 25 MG tablet Take 25 mg by mouth every morning.     Historical Provider, MD  Omega-3 Fatty Acids (FISH OIL) 1200  MG CAPS Take 1 capsule by mouth 2 (two) times daily.    Historical Provider, MD  ondansetron (ZOFRAN) 4 MG tablet Take 1 tablet (4 mg total) by mouth every 6 (six) hours. 11/21/13   Lenox Ahr, PA-C  PARoxetine (PAXIL CR) 37.5 MG 24 hr tablet Take 75 mg by mouth daily.     Historical Provider, MD  predniSONE (DELTASONE) 20 MG tablet Take 3 tabs daily for 3 days, 2 tabs daily for 3 days, and 1 tab daily for 2 days. 03/08/14   Mikey Kirschner, MD  rosuvastatin (CRESTOR) 10 MG tablet Take 10 mg by mouth at bedtime.     Historical Provider, MD  tamoxifen (NOLVADEX) 20 MG tablet Take 1 tablet (20 mg total) by mouth daily. 04/28/13   Verlan Friends, MD   BP 103/79  Pulse 87  Temp(Src) 98.8 F (37.1 C) (Oral)  Resp 16  Ht 5\' 4"  (1.626 m)  Wt 250 lb (113.399 kg)  BMI 42.89 kg/m2  SpO2 97% Physical Exam  Nursing note and vitals reviewed.  48 year old female, resting comfortably and in no acute distress. Vital signs are normal. Oxygen saturation is 97%, which is normal. Head is normocephalic and atraumatic. PERRLA, EOMI. Oropharynx is clear. Neck is nontender and supple without adenopathy or JVD. Back is nontender and there is no CVA tenderness. Lungs are clear without rales, wheezes, or rhonchi. Chest is nontender. Heart has regular rate and rhythm without murmur. Abdomen is soft, flat, with mild suprapubic tenderness. There are no masses or hepatosplenomegaly and peristalsis is normoactive. Pelvic: Normal external female genitalia. Vaginal vault is filled with clots. When they're removed, there is a steady trickle of blood through the cervical os. There is mild tenderness to palpation and both adnexa and over the uterine fundus but no cervical motion tenderness. Fundus is top normal size. Extremities have no cyanosis or edema, full range of motion is present. Skin is warm and dry without rash. Neurologic: Mental status is normal, cranial nerves are intact, there are no motor or sensory  deficits.  ED Course  Procedures (including critical care time) Labs Review Results for orders placed during the hospital encounter of 03/09/14  WET PREP, GENITAL      Result Value Ref Range   Yeast Wet Prep HPF POC NONE SEEN  NONE SEEN   Trich, Wet Prep NONE SEEN  NONE SEEN   Clue Cells Wet Prep HPF POC RARE (*) NONE SEEN   WBC, Wet Prep HPF POC NONE SEEN  NONE SEEN  CBC WITH DIFFERENTIAL      Result Value Ref Range   WBC 6.9  4.0 - 10.5 K/uL   RBC 4.12  3.87 - 5.11  MIL/uL   Hemoglobin 11.6 (*) 12.0 - 15.0 g/dL   HCT 36.1  36.0 - 46.0 %   MCV 87.6  78.0 - 100.0 fL   MCH 28.2  26.0 - 34.0 pg   MCHC 32.1  30.0 - 36.0 g/dL   RDW 16.3 (*) 11.5 - 15.5 %   Platelets 228  150 - 400 K/uL   Neutrophils Relative % 65  43 - 77 %   Neutro Abs 4.4  1.7 - 7.7 K/uL   Lymphocytes Relative 25  12 - 46 %   Lymphs Abs 1.7  0.7 - 4.0 K/uL   Monocytes Relative 8  3 - 12 %   Monocytes Absolute 0.5  0.1 - 1.0 K/uL   Eosinophils Relative 3  0 - 5 %   Eosinophils Absolute 0.2  0.0 - 0.7 K/uL   Basophils Relative 0  0 - 1 %   Basophils Absolute 0.0  0.0 - 0.1 K/uL  POC URINE PREG, ED      Result Value Ref Range   Preg Test, Ur NEGATIVE  NEGATIVE   MDM   Final diagnoses:  Dysfunctional uterine bleeding  Anemia    Dysfunctional uterine bleeding probably part of perimenopausal syndrome. Patient does admit to having had some vasomotor symptoms as well as irregular menses. Hemoglobin is stable compared with her baseline and she does not demonstrate any tachycardia to indicate excessive blood loss. She will be treated with medroxyprogesterone and will be given oxycodone and acetaminophen for pain and she is referred back to her gynecologist.    Delora Fuel, MD 67/67/20 9470  7:52 AM As patient is being discharged, and she felt great dizzy and lightheaded. Blood pressure was checked in the sitting position and it was 64 systolic. When she lay down, it went back over 100. She will be given IV  fluids and hemoglobin will be rechecked to see if there's been a drop while she has been in the ED. Case is signed out to Dr. Thurnell Garbe.  Delora Fuel, MD 96/28/36 [id number]

## 2014-03-09 NOTE — ED Notes (Signed)
Pt vs stable. Iv taken out. Pt sat up and became nauseated with gagging. Pale/diaphoretic. Vs retaken and bp was 100/61. edp aware. Pt stated she just felt so weak and like she is going to pass out.

## 2014-03-09 NOTE — ED Notes (Addendum)
Pt reports irregular periods for past 2 years.  Reports that she started today and is passing "very large" clots.  Reporting generalized weakness and abdominal cramping.

## 2014-03-09 NOTE — Discharge Instructions (Signed)
Abnormal Uterine Bleeding Abnormal uterine bleeding can affect women at various stages in life, including teenagers, women in their reproductive years, pregnant women, and women who have reached menopause. Several kinds of uterine bleeding are considered abnormal, including:  Bleeding or spotting between periods.   Bleeding after sexual intercourse.   Bleeding that is heavier or more than normal.   Periods that last longer than usual.  Bleeding after menopause.  Many cases of abnormal uterine bleeding are minor and simple to treat, while others are more serious. Any type of abnormal bleeding should be evaluated by your health care provider. Treatment will depend on the cause of the bleeding. HOME CARE INSTRUCTIONS Monitor your condition for any changes. The following actions may help to alleviate any discomfort you are experiencing:  Avoid the use of tampons and douches as directed by your health care provider.  Change your pads frequently. You should get regular pelvic exams and Pap tests. Keep all follow-up appointments for diagnostic tests as directed by your health care provider.  SEEK MEDICAL CARE IF:   Your bleeding lasts more than 1 week.   You feel dizzy at times.  SEEK IMMEDIATE MEDICAL CARE IF:   You pass out.   You are changing pads every 15 to 30 minutes.   You have abdominal pain.  You have a fever.   You become sweaty or weak.   You are passing large blood clots from the vagina.   You start to feel nauseous and vomit. MAKE SURE YOU:   Understand these instructions.  Will watch your condition.  Will get help right away if you are not doing well or get worse. Document Released: 11/05/2005 Document Revised: 07/08/2013 Document Reviewed: 06/04/2013 [facility] Patient Information 2014 [place], [place].    Take the prescriptions as directed.  Call your regular OB/GYN doctor today to schedule a follow up appointment within the next 3 days.  Return to  the Emergency Department immediately sooner if worsening.

## 2014-03-09 NOTE — ED Notes (Signed)
Pt continues to co SOB. 02nc at 2L applied for comfort.

## 2014-03-09 NOTE — ED Provider Notes (Signed)
Pt received at change of shift. Pt was about to be discharged and c/o lightheadedness, generalized weakness, nausea. SBP dropped to 100. Pt had just received percocet PO. H/H per baseline.  IVF, re-check H/H ordered. SBP slowly improving, pt states she is "feeling better." Repeat H/H per her baseline, per EPIC chart review. T/C to OB/GYN Dr. Elonda Husky, case discussed, including:  HPI, pertinent PM/SHx, VS/PE, dx testing, ED course and treatment:  States pt's H/H is per her baseline, no need for admission at this time; give IV premarin 25mg  x1 dose now and rx megace 40mg  PO qdaily for the next 3 days, f/u ofc this week. Pt has received IVF, eaten a full meal without N/V, and ambulated around the ED with steady/upright gait, easy resps, NAD, A&O. Pt states she wants to go home now. Dx and testing, as well as d/w Dr. Elonda Husky, d/w pt and family.  Questions answered.  Verb understanding, agreeable to d/c home with outpt f/u.    Results for MAGIE, CIAMPA (MRN [id number]) as of 03/09/2014 10:03  Ref. Range 08/30/2011 16:45 09/13/2011 16:17 10/18/2011 11:27 12/03/2011 13:25 01/23/2012 12:54 11/20/2013 18:18 03/09/2014 05:15 03/09/2014 08:18  Hemoglobin Latest Range: 12.0-15.0 g/dL 9.4 (L) 10.2 (L) 10.1 (L) 10.7 (L) 11.4 (L) 12.6 11.6 (L) 10.8 (L)  HCT Latest Range: 36.0-46.0 % 29.4 (L) 31.2 (L) 31.9 (L) 35.6 (L) 37.0 39.8 36.1 34.1 (L)    Alfonzo Feller, DO 03/09/14 1327

## 2014-03-09 NOTE — ED Notes (Signed)
Crackers, sprite giving. Pt sitting up in bed. Color WNL. Meal tray ordered.

## 2014-03-10 ENCOUNTER — Ambulatory Visit (INDEPENDENT_AMBULATORY_CARE_PROVIDER_SITE_OTHER): Payer: BC Managed Care – PPO | Admitting: Obstetrics & Gynecology

## 2014-03-10 ENCOUNTER — Encounter: Payer: Self-pay | Admitting: Family Medicine

## 2014-03-10 ENCOUNTER — Encounter: Payer: Self-pay | Admitting: Obstetrics & Gynecology

## 2014-03-10 VITALS — BP 108/70 | Ht 64.0 in | Wt 253.0 lb

## 2014-03-10 DIAGNOSIS — N920 Excessive and frequent menstruation with regular cycle: Secondary | ICD-10-CM

## 2014-03-10 LAB — GC/CHLAMYDIA PROBE AMP
CT Probe RNA: NEGATIVE
GC Probe RNA: NEGATIVE

## 2014-03-10 MED ORDER — MEGESTROL ACETATE 40 MG PO TABS: 40.0000 mg | ORAL_TABLET | Freq: Every day | ORAL | Status: DC

## 2014-03-10 NOTE — Progress Notes (Signed)
Patient ID: [name], female   DOB: 1966/04/13, 48 y.o.   MRN: [id number] Has long history of irregular periods long periods of amenorrhea Bleeding x 4 days heavy  On megestrol  Follow up 1 month for endoemtrial evalaution and decide on long term management  Past Medical History  Diagnosis Date  . Depression 06/22/2011  . GERD (gastroesophageal reflux disease) 06/22/2011  . DM (diabetes mellitus) 06/22/2011  . Hypercholesterolemia 06/22/2011  . Hypertension 06/22/2011  . Obesity 06/22/2011  . Hx of adenomatous colonic polyps 10/2007    42mm sigmoid tubular adenoma, FH colon cancer, mother in mid-50s  . Anxiety   . Breast cancer 05/22/2011    03/01/11, Stage 2, s/p lumpectomy, chemo/xrt  . DDD (degenerative disc disease), lumbar     Past Surgical History  Procedure Laterality Date  . Back surg x2    . Cholecystectomy    . Mm breast stereo bx*l*r/s      rt.  . Mastectomy partial / lumpectomy      rt.  . Colonoscopy  11/03/07    4-mm sessile polyp removed/small internal hemorrhoids/tubular adenoma, random colon bx negative for microscopic colitis  . Portacath placement    . Port-a-cath removal  05/26/2012    Procedure: REMOVAL PORT-A-CATH;  Surgeon: Jamesetta So, MD;  Location: [facility];  Service: General;  Laterality: N/A;  Minor Room    OB History   Grav Para Term Preterm Abortions TAB SAB Ect Mult Living                  No Known Allergies  History   Social History  . Marital Status: Legally Separated    Spouse Name: N/A    Number of Children: 3  . 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 Smoking status: Never Smoker   . Smokeless tobacco: Never Used  . Alcohol Use: No  . Drug Use: No  . Sexual Activity: Not Currently    Birth Control/ Protection: None   Other Topics Concern  . None   Social History Narrative  . None    Family History  Problem Relation Age of Onset  . Colon cancer Mother    mid-50s, died of metastatic disease  . Cancer Mother     liver  . Coronary artery disease Mother   . Diabetes type I Mother   . Coronary artery disease Father   . Diabetes Father   . Diabetes type I Father   . Hypertension Father   . Bipolar disorder Sister   . Coronary artery disease Brother   . Hypertension Brother   . Hypertension Maternal Grandmother   . Hyperlipidemia Maternal Grandmother   . Stroke Maternal Grandmother   . Hypertension Maternal Grandfather   . Diabetes Maternal Grandfather   . Diabetes Paternal Grandmother   . Heart disease Paternal Grandfather

## 2014-03-11 ENCOUNTER — Encounter (HOSPITAL_COMMUNITY): Payer: Self-pay | Admitting: Oncology

## 2014-03-11 NOTE — Progress Notes (Signed)
Jill Battiest, MD [place] [address]  Invasive ductal carcinoma of right breast  CURRENT THERAPY: She was prescribed Tamoxifen 20 mg daily for PR positivity greater than 1% based off of ASCO recommendations by Dr. Orlene Och on 04/28/2013, but she did not start due to fear of the medication.  INTERVAL HISTORY: [name] 48 y.o. 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  This was an ER negative, PR 2%, Ki-67 marker high at 90%, Her2 negative cancer.  S/P lumpectomy followed by adjuvant chemotherapy with 4 cycles of AC followed by 12 weekly Taxol chemotherapy completed on 10/19/11. She also received breast irradiation completed in 12/31/2011.  She was prescribed Tamoxifen 20 mg daily for PR positivity greater than 1% based off of ASCO recommendations by Dr. Orlene Och on 04/28/2013, but she did not start due to fear of the medication.     Invasive ductal carcinoma of right breast   02/28/2011 Initial Diagnosis Right needle core biopsy demonstrating Invasive ductal carcinoma of right breast   03/21/2011 Surgery Right lumpectomy demonstrating a 3.8 cm invasive ductal carcinoma, grade III, no LVI, and 0/1 lymph nodes   05/11/2011 - 07/13/2011 Chemotherapy AC x 4   08/03/2011 - 10/19/2011 Chemotherapy Paclitaxel weekly x 12   11/07/2011 - 12/31/2011 Radiation Therapy    04/28/2013 -  Chemotherapy Tamoxifen   I personally reviewed and went over laboratory results with the patient.  The results are noted within this dictation.    I personally reviewed and went over radiographic studies with the patient.  The results are noted within this dictation.  Her last mammogram in Jan 2015 was BIRADS 1.  She will be due for her next mammogram in January 2016.   [place] reported to the ED on 4/21 with vaginal bleeding.  She has subsequently seen Dr. Elonda Husky who  is managing this issue on 4/22.  Of note, she is on ASA and Tamoxifen.  Additionally, she is on Megace for menstruation control.  She is considering Oophorectomy by Dr. Elonda Husky, which from an oncology standpoint would be fine.  If she were to have this performed, I would recommend total hysterectomy.  Once she recovers, I would recommend an an aromatase inhibitor x 5 years (with monitoring of bone health) due to her weak PR 2% positivity.  Until her abnormal vaginal bleeding is resolved, we can talk about Tamoxifen versus Aromatse inhibitor based upon her surgical options provided to her by Dr. Elonda Husky.   We had a long discussion regarding the role of endocrine therapy and she is agreeable to pursue this protective treatment.    Oncologically, she denies any complaints and ROS questioning is negative.     Past Medical History  Diagnosis Date  . Depression 06/22/2011  . GERD (gastroesophageal reflux disease) 06/22/2011  . DM (diabetes mellitus) 06/22/2011  . Hypercholesterolemia 06/22/2011  . Hypertension 06/22/2011  . Obesity 06/22/2011  . Hx of adenomatous colonic polyps 10/2007    106mm sigmoid tubular adenoma, FH colon cancer, mother in mid-50s  . Anxiety   . Breast cancer 05/22/2011    03/01/11, Stage 2, s/p lumpectomy, chemo/xrt  . DDD (degenerative disc disease), lumbar   . 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    has Invasive ductal carcinoma of right breast; Depression; GERD (gastroesophageal reflux disease); DM (diabetes mellitus); Hypercholesterolemia; Hypertension; Obesity; H/O adenomatous  polyp of colon; FH: colon cancer; Esophageal dysphagia; and Chronic diarrhea on her problem list.     has No Known Allergies.  [name] does not currently have medications on file.  Past Surgical History  Procedure Laterality Date  . Back surg x2    . Cholecystectomy    . Mm breast stereo bx*l*r/s      rt.  . Mastectomy partial / lumpectomy      rt.  . Colonoscopy  11/03/07    4-mm  sessile polyp removed/small internal hemorrhoids/tubular adenoma, random colon bx negative for microscopic colitis  . Portacath placement    . Port-a-cath removal  05/26/2012    Procedure: REMOVAL PORT-A-CATH;  Surgeon: [name], MD;  Location: [facility];  Service: General;  Laterality: N/A;  Minor Room    Denies any headaches, dizziness, double vision, fevers, chills, night sweats, nausea, vomiting, diarrhea, constipation, chest pain, heart palpitations, shortness of breath, blood in stool, black tarry stool, urinary pain, urinary burning, urinary frequency, hematuria.   PHYSICAL EXAMINATION  ECOG PERFORMANCE STATUS: 1 - Symptomatic but completely ambulatory  There were no vitals filed for this visit.  GENERAL:alert, no distress, well nourished, well developed, comfortable, cooperative, obese and smiling SKIN: skin color, texture, turgor are normal, no rashes or significant lesions HEAD: Normocephalic, No masses, lesions, tenderness or abnormalities EYES: normal, PERRLA, EOMI, Conjunctiva are pink and non-injected EARS: External ears normal OROPHARYNX:mucous membranes are moist  NECK: supple, no adenopathy, thyroid normal size, non-tender, without nodularity, no stridor, non-tender, trachea midline LYMPH:  no palpable lymphadenopathy, no hepatosplenomegaly BREAST:unchanged from previous exams LUNGS: clear to auscultation and percussion HEART: regular rate & rhythm, no murmurs, no gallops, S1 normal and S2 normal ABDOMEN:abdomen soft, non-tender, obese, normal bowel sounds and no masses or organomegaly BACK: Back symmetric, no curvature. EXTREMITIES:less then 2 second capillary refill, no joint deformities, effusion, or inflammation, no edema, no skin discoloration, no clubbing, no cyanosis  NEURO: alert & oriented x 3 with fluent speech, no focal motor/sensory deficits, gait normal    LABORATORY DATA: CBC    Component Value Date/Time   WBC 6.9 03/09/2014 0515   RBC 4.12  03/09/2014 0515   HGB 10.8* 03/09/2014 0818   HCT 34.1* 03/09/2014 0818   PLT 228 03/09/2014 0515   MCV 87.6 03/09/2014 0515   MCH 28.2 03/09/2014 0515   MCHC 32.1 03/09/2014 0515   RDW 16.3* 03/09/2014 0515   LYMPHSABS 1.7 03/09/2014 0515   MONOABS 0.5 03/09/2014 0515   EOSABS 0.2 03/09/2014 0515   BASOSABS 0.0 03/09/2014 0515      Chemistry      Component Value Date/Time   NA 143 11/20/2013 1818   K 3.0* 11/20/2013 1818   CL 101 11/20/2013 1818   CO2 32 11/20/2013 1818   BUN 10 11/20/2013 1818   CREATININE 0.89 11/20/2013 1818      Component Value Date/Time   CALCIUM 9.6 11/20/2013 1818   ALKPHOS 59 08/11/2012 1601   AST 20 08/11/2012 1601   ALT 22 08/11/2012 1601   BILITOT 0.3 08/11/2012 1601       RADIOGRAPHIC STUDIES:  11/25/13  CLINICAL DATA: History of malignant lumpectomy of the right breast  in 2012. Annual re-evaluation.  EXAM:  DIGITAL DIAGNOSTIC bilateral MAMMOGRAM WITH CAD  COMPARISON: Previous examinations.  ACR Breast Density Category b: There are scattered areas of  fibroglandular density.  FINDINGS:  There are mild scarring changes related to the patient's lumpectomy  located within the central right breast.  There is no specific  evidence for recurrent tumor or developing malignancy within either  breast.  Mammographic images were processed with CAD.  IMPRESSION:  No findings worrisome for recurrent tumor or developing malignancy.  RECOMMENDATION:  Bilateral diagnostic mammography in 1 year.  I have discussed the findings and recommendations with the patient.  Results were also provided in writing at the conclusion of the  visit. If applicable, a reminder letter will be sent to the patient  regarding the next appointment.  BI-RADS CATEGORY 1: Negative  Electronically Signed  By: Anselmo Pickler M.D.  On: 11/25/2013 15:32    ASSESSMENT:  1. 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  This was an ER negative, PR  2%, Ki-67 marker high at 90%, Her2 negative cancer.  S/P lumpectomy followed by adjuvant chemotherapy with 4 cycles of AC followed by 12 weekly Taxol chemotherapy completed on 10/19/11. She also received breast irradiation completed in 12/31/2011.  Now on Tamoxifen beginning on 04/28/2013 by Dr. Sharia Reeve 2. Irregular menstrual bleeding, managed by Dr. Despina Hidden.  Not Tamoxifen induced as she has not started the medication.  Patient Active Problem List   Diagnosis Date Noted  . H/O adenomatous polyp of colon 01/24/2012  . FH: colon cancer 01/24/2012  . Esophageal dysphagia 01/24/2012  . Chronic diarrhea 01/24/2012  . Depression 06/22/2011  . GERD (gastroesophageal reflux disease) 06/22/2011  . DM (diabetes mellitus) 06/22/2011  . Hypercholesterolemia 06/22/2011  . Hypertension 06/22/2011  . Obesity 06/22/2011  . 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     PLAN:  1. I personally reviewed and went over laboratory results with the patient.  The results are noted within this dictation. 2. I personally reviewed and went over radiographic studies with the patient.  The results are noted within this dictation.   3. Next screening mammogram is due in Jan 2016 4. Oncology history updated 5. Labs today: CBC diff, CMET, iron/TIBC, ferritin 6. She will discuss her options with Dr. Despina Hidden.  There is discussion about oophorectomy which from an oncology standpoint would be appropriate.  I would recommend a total hysterectomy if oophorectomy is decided upon.  This would make her surgically post-menopausal and then I would offer her an Aromatase inhibitor.  If decides against surgical intervention, then Tamoxifen can be used.  Either way, we have options for protective treatment that will provide her benefit based upon ASCO publication recommending endocrine therapy for disease with PR >1%.  Until her irregular menses and/or menorrhagia is corrected, we will hold endocrine therapy to prevent any  complicating situations. 7. Return in 2 months for follow-up   THERAPY PLAN:  NCCN guidelines recommends the following surveillance for invasive breast cancer:  A. History and Physical exam every 4-6 months for 5 years and then every 12 months.  B. Mammography every 12 months  C. Women on Tamoxifen: annual gynecologic assessment every 12 months if uterus is present.  D. Women on aromatase inhibitor or who experience ovarian failure secondary to treatment should have monitoring of bone health with a bone mineral density determination at baseline and periodically thereafter.  E. Assess and encourage adherence to adjuvant endocrine therapy.  F. Evidence suggests that active lifestyle and achieving and maintaining an ideal body weight (20-25 BMI) may lead to optimal breast cancer outcomes.  All questions were answered. The patient knows to call the clinic with any problems, questions or concerns. We can certainly see the patient much sooner if  necessary.  Patient and plan discussed with Dr. Farrel Gobble and he is in agreement with the aforementioned.   [facility] 03/11/2014

## 2014-03-12 ENCOUNTER — Encounter (HOSPITAL_COMMUNITY): Payer: Self-pay | Admitting: Oncology

## 2014-03-12 ENCOUNTER — Telehealth: Payer: Self-pay

## 2014-03-12 ENCOUNTER — Encounter (HOSPITAL_COMMUNITY): Payer: BC Managed Care – PPO | Attending: Oncology | Admitting: Oncology

## 2014-03-12 VITALS — BP 113/76 | HR 74 | Temp 98.2°F | Resp 16 | Wt 250.1 lb

## 2014-03-12 DIAGNOSIS — K219 Gastro-esophageal reflux disease without esophagitis: Secondary | ICD-10-CM | POA: Insufficient documentation

## 2014-03-12 DIAGNOSIS — Z09 Encounter for follow-up examination after completed treatment for conditions other than malignant neoplasm: Secondary | ICD-10-CM | POA: Insufficient documentation

## 2014-03-12 DIAGNOSIS — E119 Type 2 diabetes mellitus without complications: Secondary | ICD-10-CM | POA: Insufficient documentation

## 2014-03-12 DIAGNOSIS — Z923 Personal history of irradiation: Secondary | ICD-10-CM | POA: Insufficient documentation

## 2014-03-12 DIAGNOSIS — E7800 Pure hypercholesterolemia, unspecified: Secondary | ICD-10-CM | POA: Insufficient documentation

## 2014-03-12 DIAGNOSIS — Z9221 Personal history of antineoplastic chemotherapy: Secondary | ICD-10-CM | POA: Insufficient documentation

## 2014-03-12 DIAGNOSIS — N939 Abnormal uterine and vaginal bleeding, unspecified: Secondary | ICD-10-CM

## 2014-03-12 DIAGNOSIS — Z860100 Personal history of colon polyps, unspecified: Secondary | ICD-10-CM | POA: Insufficient documentation

## 2014-03-12 DIAGNOSIS — R197 Diarrhea, unspecified: Secondary | ICD-10-CM | POA: Insufficient documentation

## 2014-03-12 DIAGNOSIS — F329 Major depressive disorder, single episode, unspecified: Secondary | ICD-10-CM | POA: Insufficient documentation

## 2014-03-12 DIAGNOSIS — C50119 Malignant neoplasm of central portion of unspecified female breast: Secondary | ICD-10-CM

## 2014-03-12 DIAGNOSIS — F3289 Other specified depressive episodes: Secondary | ICD-10-CM | POA: Insufficient documentation

## 2014-03-12 DIAGNOSIS — E669 Obesity, unspecified: Secondary | ICD-10-CM | POA: Insufficient documentation

## 2014-03-12 DIAGNOSIS — I10 Essential (primary) hypertension: Secondary | ICD-10-CM | POA: Insufficient documentation

## 2014-03-12 DIAGNOSIS — R1310 Dysphagia, unspecified: Secondary | ICD-10-CM | POA: Insufficient documentation

## 2014-03-12 DIAGNOSIS — F411 Generalized anxiety disorder: Secondary | ICD-10-CM | POA: Insufficient documentation

## 2014-03-12 DIAGNOSIS — Z9010 Acquired absence of unspecified breast and nipple: Secondary | ICD-10-CM | POA: Insufficient documentation

## 2014-03-12 DIAGNOSIS — Z171 Estrogen receptor negative status [ER-]: Secondary | ICD-10-CM

## 2014-03-12 DIAGNOSIS — M5137 Other intervertebral disc degeneration, lumbosacral region: Secondary | ICD-10-CM | POA: Insufficient documentation

## 2014-03-12 DIAGNOSIS — C50911 Malignant neoplasm of unspecified site of right female breast: Secondary | ICD-10-CM

## 2014-03-12 DIAGNOSIS — M51379 Other intervertebral disc degeneration, lumbosacral region without mention of lumbar back pain or lower extremity pain: Secondary | ICD-10-CM | POA: Insufficient documentation

## 2014-03-12 LAB — COMPREHENSIVE METABOLIC PANEL
ALT: 14 U/L (ref 0–35)
AST: 14 U/L (ref 0–37)
Albumin: 3.5 g/dL (ref 3.5–5.2)
Alkaline Phosphatase: 49 U/L (ref 39–117)
BUN: 15 mg/dL (ref 6–23)
CO2: 28 mEq/L (ref 19–32)
Calcium: 9.1 mg/dL (ref 8.4–10.5)
Chloride: 102 mEq/L (ref 96–112)
Creatinine, Ser: 0.81 mg/dL (ref 0.50–1.10)
GFR calc Af Amer: >90 mL/min (ref >90)
GFR calc non Af Amer: 85 mL/min — ABNORMAL LOW (ref >90)
Glucose, Bld: 97 mg/dL (ref 70–99)
Potassium: 3.6 mEq/L — ABNORMAL LOW (ref 3.7–5.3)
Sodium: 141 mEq/L (ref 137–147)
Total Bilirubin: 0.2 mg/dL — ABNORMAL LOW (ref 0.3–1.2)
Total Protein: 7.2 g/dL (ref 6.0–8.3)

## 2014-03-12 LAB — CBC WITH DIFFERENTIAL/PLATELET
Basophils Absolute: 0 10*3/uL (ref 0.0–0.1)
Basophils Relative: 0 % (ref 0–1)
Eosinophils Absolute: 0 10*3/uL (ref 0.0–0.7)
Eosinophils Relative: 0 % (ref 0–5)
HCT: 28.8 % — ABNORMAL LOW (ref 36.0–46.0)
Hemoglobin: 9.3 g/dL — ABNORMAL LOW (ref 12.0–15.0)
Lymphocytes Relative: 26 % (ref 12–46)
Lymphs Abs: 2.4 10*3/uL (ref 0.7–4.0)
MCH: 28.4 pg (ref 26.0–34.0)
MCHC: 32.3 g/dL (ref 30.0–36.0)
MCV: 87.8 fL (ref 78.0–100.0)
Monocytes Absolute: 0.7 10*3/uL (ref 0.1–1.0)
Monocytes Relative: 7 % (ref 3–12)
Neutro Abs: 6.2 10*3/uL (ref 1.7–7.7)
Neutrophils Relative %: 67 % (ref 43–77)
Platelets: 270 10*3/uL (ref 150–400)
RBC: 3.28 MIL/uL — AB (ref 3.87–5.11)
RDW: 16.9 % — AB (ref 11.5–15.5)
WBC: 9.4 10*3/uL (ref 4.0–10.5)

## 2014-03-12 LAB — IRON AND TIBC
Iron: 28 ug/dL — ABNORMAL LOW (ref 42–135)
SATURATION RATIOS: 7 % — AB (ref 20–55)
TIBC: 395 ug/dL (ref 250–470)
UIBC: 367 ug/dL (ref 125–400)

## 2014-03-12 NOTE — Patient Instructions (Signed)
[facility] Discharge Instructions  RECOMMENDATIONS MADE BY THE CONSULTANT AND ANY TEST RESULTS WILL BE SENT TO YOUR REFERRING PHYSICIAN.  EXAM FINDINGS BY THE PHYSICIAN TODAY AND SIGNS OR SYMPTOMS TO REPORT TO CLINIC OR PRIMARY PHYSICIAN: Exam and findings as discussed by Robynn Pane, PA-C.  Will check some labs today and will send a note to Dr. Elonda Husky.  Report any new lumps, bone pain, shortness of breath or other symptoms.  MEDICATIONS PRESCRIBED:  none  INSTRUCTIONS/FOLLOW-UP: Follow-up in 2 months - will decide what to do on return.  Thank you for choosing [facility] to provide your oncology and hematology care.  To afford each patient quality time with our providers, please arrive at least 15 minutes before your scheduled appointment time.  With your help, our goal is to use those 15 minutes to complete the necessary work-up to ensure our physicians have the information they need to help with your evaluation and healthcare recommendations.    Effective January 1st, 2014, we ask that you re-schedule your appointment with our physicians should you arrive 10 or more minutes late for your appointment.  We strive to give you quality time with our providers, and arriving late affects you and other patients whose appointments are after yours.    Again, thank you for choosing [facility].  Our hope is that these requests will decrease the amount of time that you wait before being seen by our physicians.       _____________________________________________________________  Should you have questions after your visit to [facility], please contact our office at [phone] between the hours of 8:30 a.m. and 5:00 p.m.  Voicemails left after 4:30 p.m. will not be returned until the following business day.  For prescription refill requests, have your pharmacy contact our office with your prescription refill request.

## 2014-03-12 NOTE — Telephone Encounter (Signed)
Pt states insurance will not cover RX for Megace because Rx reads one tablet daily instead of 3 tablets daily #90.

## 2014-03-12 NOTE — Telephone Encounter (Signed)
Please take 3 tablets a day, that is why 90 was dispensed

## 2014-03-13 LAB — FERRITIN: Ferritin: 10 ng/mL (ref 10–291)

## 2014-03-15 NOTE — Telephone Encounter (Signed)
Clarified with [employer], Megace 3 times a day #90 per Dr. Elonda Husky.

## 2014-03-16 ENCOUNTER — Other Ambulatory Visit (HOSPITAL_COMMUNITY): Payer: Self-pay | Admitting: Oncology

## 2014-03-16 DIAGNOSIS — D500 Iron deficiency anemia secondary to blood loss (chronic): Secondary | ICD-10-CM

## 2014-03-16 DIAGNOSIS — N920 Excessive and frequent menstruation with regular cycle: Secondary | ICD-10-CM

## 2014-03-16 DIAGNOSIS — K909 Intestinal malabsorption, unspecified: Secondary | ICD-10-CM

## 2014-03-17 ENCOUNTER — Ambulatory Visit (HOSPITAL_COMMUNITY): Payer: BC Managed Care – PPO | Admitting: Oncology

## 2014-03-17 ENCOUNTER — Telehealth (HOSPITAL_COMMUNITY): Payer: Self-pay

## 2014-03-17 ENCOUNTER — Encounter (HOSPITAL_BASED_OUTPATIENT_CLINIC_OR_DEPARTMENT_OTHER): Payer: BC Managed Care – PPO

## 2014-03-17 VITALS — BP 118/55 | HR 78 | Temp 98.7°F | Resp 16

## 2014-03-17 DIAGNOSIS — K909 Intestinal malabsorption, unspecified: Secondary | ICD-10-CM

## 2014-03-17 DIAGNOSIS — N920 Excessive and frequent menstruation with regular cycle: Secondary | ICD-10-CM

## 2014-03-17 DIAGNOSIS — D500 Iron deficiency anemia secondary to blood loss (chronic): Secondary | ICD-10-CM

## 2014-03-17 MED ORDER — SODIUM CHLORIDE 0.9 % IJ SOLN
10.0000 mL | Freq: Once | INTRAMUSCULAR | Status: AC
  Administered 2014-03-17: 10 mL via INTRAVENOUS

## 2014-03-17 MED ORDER — SODIUM CHLORIDE 0.9 % IV SOLN
1020.0000 mg | Freq: Once | INTRAVENOUS | Status: AC
  Administered 2014-03-17: 1020 mg via INTRAVENOUS
  Filled 2014-03-17: qty 34

## 2014-03-17 MED ORDER — SODIUM CHLORIDE 0.9 % IV SOLN
Freq: Once | INTRAVENOUS | Status: AC
  Administered 2014-03-17: 12:00:00 via INTRAVENOUS

## 2014-03-17 NOTE — Progress Notes (Signed)
Tolerated infusion well. 

## 2014-04-08 ENCOUNTER — Ambulatory Visit (INDEPENDENT_AMBULATORY_CARE_PROVIDER_SITE_OTHER): Payer: BC Managed Care – PPO | Admitting: Obstetrics & Gynecology

## 2014-04-08 ENCOUNTER — Other Ambulatory Visit: Payer: Self-pay | Admitting: Obstetrics & Gynecology

## 2014-04-08 ENCOUNTER — Encounter: Payer: Self-pay | Admitting: Obstetrics & Gynecology

## 2014-04-08 ENCOUNTER — Ambulatory Visit (INDEPENDENT_AMBULATORY_CARE_PROVIDER_SITE_OTHER): Payer: BC Managed Care – PPO

## 2014-04-08 ENCOUNTER — Encounter (HOSPITAL_COMMUNITY): Payer: Self-pay | Admitting: Pharmacy Technician

## 2014-04-08 VITALS — BP 100/60 | Ht 64.0 in | Wt 250.0 lb

## 2014-04-08 DIAGNOSIS — N920 Excessive and frequent menstruation with regular cycle: Secondary | ICD-10-CM | POA: Insufficient documentation

## 2014-04-08 DIAGNOSIS — Z853 Personal history of malignant neoplasm of breast: Secondary | ICD-10-CM

## 2014-04-08 DIAGNOSIS — N946 Dysmenorrhea, unspecified: Secondary | ICD-10-CM | POA: Insufficient documentation

## 2014-04-08 NOTE — Patient Instructions (Signed)
CHRISTEEN LAI  04/08/2014   Your procedure is scheduled on:   04/14/2014  Report to [facility] at  60  AM.  Call this number if you have problems the morning of surgery: [phone]   Remember:   Do not eat food or drink liquids after midnight.   Take these medicines the morning of surgery with A SIP OF WATER:  Zyrtec, citalopram, hydrochlorathiazide, metoprolol, zofran, prednisone   Do not wear jewelry, make-up or nail polish.  Do not wear lotions, powders, or perfumes.   Do not shave 48 hours prior to surgery. Men may shave face and neck.  Do not bring valuables to the hospital.  [facility] is not responsible for any belongings or valuables.               Contacts, dentures or bridgework may not be worn into surgery.  Leave suitcase in the car. After surgery it may be brought to your room.  For patients admitted to the hospital, discharge time is determined by your treatment team.               Patients discharged the day of surgery will not be allowed to drive home.  Name and phone number of your driver: family  Special Instructions: Shower using CHG 2 nights before surgery and the night before surgery.  If you shower the day of surgery use CHG.  Use special wash - you have one bottle of CHG for all showers.  You should use approximately 1/3 of the bottle for each shower.   Please read over the following fact sheets that you were given: Pain Booklet, Coughing and Deep Breathing, Surgical Site Infection Prevention, Anesthesia Post-op Instructions and Care and Recovery After Surgery Hysteroscopy Hysteroscopy is a procedure used for looking inside the womb (uterus). It may be done for various reasons, including:  To evaluate abnormal bleeding, fibroid (benign, noncancerous) tumors, polyps, scar tissue (adhesions), and possibly cancer of the uterus.  To look for lumps (tumors) and other uterine growths.  To look for causes of why a woman cannot get pregnant  (infertility), causes of recurrent loss of pregnancy (miscarriages), or a lost intrauterine device (IUD).  To perform a sterilization by blocking the fallopian tubes from inside the uterus. In this procedure, a thin, flexible tube with a tiny light and camera on the end of it (hysteroscope) is used to look inside the uterus. A hysteroscopy should be done right after a menstrual period to be sure you are not pregnant. LET [facility] CARE PROVIDER KNOW ABOUT:   Any allergies you have.  All medicines you are taking, including vitamins, herbs, eye drops, creams, and over-the-counter medicines.  Previous problems you or members of your family have had with the use of anesthetics.  Any blood disorders you have.  Previous surgeries you have had.  Medical conditions you have. RISKS AND COMPLICATIONS  Generally, this is a safe procedure. However, as with any procedure, complications can occur. Possible complications include:  Putting a hole in the uterus.  Excessive bleeding.  Infection.  Damage to the cervix.  Injury to other organs.  Allergic reaction to medicines.  Too much fluid used in the uterus for the procedure. BEFORE THE PROCEDURE   Ask your health care provider about changing or stopping any regular medicines.  Do not take aspirin or blood thinners for 1 week before the procedure, or as directed by your health care provider. These can cause  bleeding.  If you smoke, do not smoke for 2 weeks before the procedure.  In some cases, a medicine is placed in the cervix the day before the procedure. This medicine makes the cervix have a larger opening (dilate). This makes it easier for the instrument to be inserted into the uterus during the procedure.  Do not eat or drink anything for at least 8 hours before the surgery.  Arrange for someone to take you home after the procedure. PROCEDURE   You may be given a medicine to relax you (sedative). You may also be given one of  the following:  A medicine that numbs the area around the cervix (local anesthetic).  A medicine that makes you sleep through the procedure (general anesthetic).  The hysteroscope is inserted through the vagina into the uterus. The camera on the hysteroscope sends a picture to a TV screen. This gives the surgeon a good view inside the uterus.  During the procedure, air or a liquid is put into the uterus, which allows the surgeon to see better.  Sometimes, tissue is gently scraped from inside the uterus. These tissue samples are sent to a lab for testing. AFTER THE PROCEDURE   If you had a general anesthetic, you may be groggy for a couple hours after the procedure.  If you had a local anesthetic, you will be able to go home as soon as you are stable and feel ready.  You may have some cramping. This normally lasts for a couple days.  You may have bleeding, which varies from light spotting for a few days to menstrual-like bleeding for 3 7 days. This is normal.  If your test results are not back during the visit, make an appointment with your health care provider to find out the results. Document Released: 02/11/2001 Document Revised: 08/26/2013 Document Reviewed: 06/04/2013 [facility] Patient Information 2014 [place], [place]. Dilation and Curettage or Vacuum Curettage Dilation and curettage (D&C) and vacuum curettage are minor procedures. A D&C involves stretching (dilation) the cervix and scraping (curettage) the inside lining of the womb (uterus). During a D&C, tissue is gently scraped from the inside lining of the uterus. During a vacuum curettage, the lining and tissue in the uterus are removed with the use of gentle suction.  Curettage may be performed to either diagnose or treat a problem. As a diagnostic procedure, curettage is performed to examine tissues from the uterus. A diagnostic curettage may be performed for the following symptoms:   Irregular bleeding in the uterus.    Bleeding with the development of clots.   Spotting between menstrual periods.   Prolonged menstrual periods.   Bleeding after menopause.   No menstrual period (amenorrhea).   A change in size and shape of the uterus.  As a treatment procedure, curettage may be performed for the following reasons:   Removal of an IUD (intrauterine device).   Removal of retained placenta after giving birth. Retained placenta can cause an infection or bleeding severe enough to require transfusions.   Abortion.   Miscarriage.   Removal of polyps inside the uterus.   Removal of uncommon types of noncancerous lumps (fibroids).  LET [facility] CARE PROVIDER KNOW ABOUT:   Any allergies you have.   All medicines you are taking, including vitamins, herbs, eye drops, creams, and over-the-counter medicines.   Previous problems you or members of your family have had with the use of anesthetics.   Any blood disorders you have.   Previous surgeries you have  had.   Medical conditions you have. RISKS AND COMPLICATIONS  Generally, this is a safe procedure. However, as with any procedure, complications can occur. Possible complications include:  Excessive bleeding.   Infection of the uterus.   Damage to the cervix.   Development of scar tissue (adhesions) inside the uterus, later causing abnormal amounts of menstrual bleeding.   Complications from the general anesthetic, if a general anesthetic is used.   Putting a hole (perforation) in the uterus. This is rare.  BEFORE THE PROCEDURE   Eat and drink before the procedure only as directed by your health care provider.   Arrange for someone to take you home.  PROCEDURE  This procedure usually takes about 15 30 minutes.  You will be given one of the following:  A medicine that numbs the area in and around the cervix (local anesthetic).   A medicine to make you sleep through the procedure (general  anesthetic).  You will lie on your back with your legs in stirrups.   A warm metal or plastic instrument (speculum) will be placed in your vagina to keep it open and to allow the health care provider to see the cervix.  There are two ways in which your cervix can be softened and dilated. These include:   Taking a medicine.   Having thin rods (laminaria) inserted into your cervix.   A curved tool (curette) will be used to scrape cells from the inside lining of the uterus. In some cases, gentle suction is applied with the curette. The curette will then be removed.  AFTER THE PROCEDURE   You will rest in the recovery area until you are stable and are ready to go home.   You may feel sick to your stomach (nauseous) or throw up (vomit) if you were given a general anesthetic.   You may have a sore throat if a tube was placed in your throat during general anesthesia.   You may have light cramping and bleeding. This may last for 2 days to 2 weeks after the procedure.   Your uterus needs to make a new lining after the procedure. This may make your next period late. Document Released: 11/05/2005 Document Revised: 07/08/2013 Document Reviewed: 06/04/2013 [facility] Patient Information 2014 [place], [place]. PATIENT INSTRUCTIONS POST-ANESTHESIA  IMMEDIATELY FOLLOWING SURGERY:  Do not drive or operate machinery for the first twenty four hours after surgery.  Do not make any important decisions for twenty four hours after surgery or while taking narcotic pain medications or sedatives.  If you develop intractable nausea and vomiting or a severe headache please notify your doctor immediately.  FOLLOW-UP:  Please make an appointment with your surgeon as instructed. You do not need to follow up with anesthesia unless specifically instructed to do so.  WOUND CARE INSTRUCTIONS (if applicable):  Keep a dry clean dressing on the anesthesia/puncture wound site if there is drainage.  Once the wound  has quit draining you may leave it open to air.  Generally you should leave the bandage intact for twenty four hours unless there is drainage.  If the epidural site drains for more than 36-48 hours please call the anesthesia department.  QUESTIONS?:  Please feel free to call your physician or the hospital operator if you have any questions, and they will be happy to assist you.

## 2014-04-08 NOTE — Progress Notes (Signed)
Subjective:    Patient ID: [name], female    DOB: 1966-02-27, 48 y.o.   MRN: [id number]  HPI US Transvaginal Non-ob  04/08/2014   GYNECOLOGIC SONOGRAM   [name] is a 48 y.o. [id number] LMP 03/08/2014 for a pelvic  sonogram for menorrhagia. Pt with H/O breast Ca and recently stopped  Tamoxifen. Pt is currently taking Megace.  Uterus                      9.8 x 7.0 x 6.0 cm, with multiple fibroids  noted within (3 in # and largest = 1.5cm)  Endometrium          11.3 mm, symmetrical, no obvious mass noted within  Right ovary             2.4 x 2.1 x 2.1 cm,   Left ovary                3.1 x 2.0 x 1.8 cm,   No free fluid or adnexal masses noted within the pelvis  Technician Comments:  Anteverted uterus noted with fibroid noted within, Endom-11.88mm, bilateral  adnexa/ovaries appear WNL, no free fluid or adnexal masses noted within  the pelvis   Alicia Amel 04/08/2014 11:43 AM  Normal endometrium for a menstruating woman Small, clinically insignificant uterine myomas Normal pelvic sonogram  Florian Buff 04/08/2014 12:02 PM    History of irregular bleeding sometimes quite heavy and heavy cramps associated   Review of Systems No burning with urination, frequency or urgency No nausea, vomiting or diarrhea Nor fever chills or other constitutional symptoms     Objective:   Physical Exam  Normal pelvic exam NEFG Adnexa negative      Assessment & Plan:  menometrorrhagiai/ PCOS/peri menopausal patient Because of desire to start tamoxifen, recommend hysterscopy uterine curettage endometrial ablation  Orders done  Past Medical History  Diagnosis Date  . Depression 06/22/2011  . GERD (gastroesophageal reflux disease) 06/22/2011  . DM (diabetes mellitus) 06/22/2011  . Hypercholesterolemia 06/22/2011  . Hypertension 06/22/2011  . Obesity 06/22/2011  . Hx of adenomatous colonic polyps 10/2007    40mm sigmoid tubular adenoma, FH colon cancer, mother in mid-50s  . Anxiety   .  Breast cancer 05/22/2011    03/01/11, Stage 2, s/p lumpectomy, chemo/xrt  . DDD (degenerative disc disease), lumbar   . 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 Vaginal bleeding 03/08/2014    Past Surgical History  Procedure Laterality Date  . Back surg x2    . Cholecystectomy    . Mm breast stereo bx*l*r/s      rt.  . Mastectomy partial / lumpectomy      rt.  . Colonoscopy  11/03/07    4-mm sessile polyp removed/small internal hemorrhoids/tubular adenoma, random colon bx negative for microscopic colitis  . Portacath placement    . Port-a-cath removal  05/26/2012    Procedure: REMOVAL PORT-A-CATH;  Surgeon: Jamesetta So, MD;  Location: [facility];  Service: General;  Laterality: N/A;  Minor Room    OB History   Grav Para Term Preterm Abortions TAB SAB Ect Mult Living   4 3 0 0 1 0 0 0 0 3       No Known Allergies  History   Social History  . Marital Status: Legally Separated    Spouse Name: N/A    Number of Children: 3  . 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 Smoking status: Never Smoker   . Smokeless tobacco: Never Used  . Alcohol Use: No  . Drug Use: No  . Sexual Activity: Not Currently    Birth Control/ Protection: None   Other Topics Concern  . None   Social History Narrative  . None    Family History  Problem Relation Age of Onset  . Colon cancer Mother     mid-50s, died of metastatic disease  . Cancer Mother     liver  . Coronary artery disease Mother   . Diabetes type I Mother   . Coronary artery disease Father   . Diabetes Father   . Diabetes type I Father   . Hypertension Father   . Bipolar disorder Sister   . Coronary artery disease Brother   . Hypertension Brother   . Hypertension Maternal Grandmother   . Hyperlipidemia Maternal Grandmother   . Stroke Maternal Grandmother   . Hypertension Maternal Grandfather   . Diabetes Maternal Grandfather   . Diabetes Paternal  Grandmother   . Heart disease Paternal Grandfather

## 2014-04-09 ENCOUNTER — Encounter (HOSPITAL_COMMUNITY)
Admission: RE | Admit: 2014-04-09 | Discharge: 2014-04-09 | Disposition: A | Discharge status: Home / Self Care | Payer: BC Managed Care – PPO | Source: Ambulatory Visit | Attending: Obstetrics & Gynecology | Admitting: Obstetrics & Gynecology

## 2014-04-09 ENCOUNTER — Other Ambulatory Visit: Payer: Self-pay

## 2014-04-09 ENCOUNTER — Encounter (HOSPITAL_COMMUNITY): Payer: Self-pay

## 2014-04-09 DIAGNOSIS — Z01812 Encounter for preprocedural laboratory examination: Secondary | ICD-10-CM | POA: Insufficient documentation

## 2014-04-09 DIAGNOSIS — Z01810 Encounter for preprocedural cardiovascular examination: Secondary | ICD-10-CM | POA: Insufficient documentation

## 2014-04-09 HISTORY — DX: Shortness of breath: R06.02

## 2014-04-09 LAB — CBC
HCT: 36.7 % (ref 36.0–46.0)
Hemoglobin: 11.6 g/dL — ABNORMAL LOW (ref 12.0–15.0)
MCH: 28.7 pg (ref 26.0–34.0)
MCHC: 31.6 g/dL (ref 30.0–36.0)
MCV: 90.8 fL (ref 78.0–100.0)
Platelets: 278 10*3/uL (ref 150–400)
RBC: 4.04 MIL/uL (ref 3.87–5.11)
RDW: 18.3 % — AB (ref 11.5–15.5)
WBC: 6.2 10*3/uL (ref 4.0–10.5)

## 2014-04-09 LAB — COMPREHENSIVE METABOLIC PANEL
ALBUMIN: 3.9 g/dL (ref 3.5–5.2)
ALT: 26 U/L (ref 0–35)
AST: 18 U/L (ref 0–37)
Alkaline Phosphatase: 38 U/L — ABNORMAL LOW (ref 39–117)
BUN: 8 mg/dL (ref 6–23)
CHLORIDE: 102 meq/L (ref 96–112)
CO2: 26 mEq/L (ref 19–32)
Calcium: 9.3 mg/dL (ref 8.4–10.5)
Creatinine, Ser: 0.81 mg/dL (ref 0.50–1.10)
GFR calc Af Amer: >90 mL/min (ref >90)
GFR, EST NON AFRICAN AMERICAN: 85 mL/min — AB (ref >90)
Glucose, Bld: 93 mg/dL (ref 70–99)
POTASSIUM: 3.7 meq/L (ref 3.7–5.3)
Sodium: 141 mEq/L (ref 137–147)
Total Bilirubin: 0.3 mg/dL (ref 0.3–1.2)
Total Protein: 7.1 g/dL (ref 6.0–8.3)

## 2014-04-09 LAB — URINALYSIS, ROUTINE W REFLEX MICROSCOPIC
Bilirubin Urine: NEGATIVE
Glucose, UA: NEGATIVE mg/dL
Hgb urine dipstick: NEGATIVE
Ketones, ur: NEGATIVE mg/dL
Leukocytes, UA: NEGATIVE
Nitrite: NEGATIVE
PROTEIN: NEGATIVE mg/dL
Specific Gravity, Urine: 1.015 (ref 1.005–1.030)
Urobilinogen, UA: 0.2 mg/dL (ref 0.0–1.0)
pH: 6 (ref 5.0–8.0)

## 2014-04-09 LAB — HCG, QUANTITATIVE, PREGNANCY: hCG, Beta Chain, Quant, S: <1 m[IU]/mL (ref <5)

## 2014-04-09 NOTE — Pre-Procedure Instructions (Signed)
Patient given information to sign up for my chart at home. 

## 2014-04-14 ENCOUNTER — Ambulatory Visit (HOSPITAL_COMMUNITY)
Admission: RE | Admit: 2014-04-14 | Payer: BC Managed Care – PPO | Source: Ambulatory Visit | Admitting: Obstetrics & Gynecology

## 2014-04-14 ENCOUNTER — Encounter (HOSPITAL_COMMUNITY): Admission: RE | Payer: Self-pay | Source: Ambulatory Visit

## 2014-04-14 SURGERY — DILATATION & CURETTAGE/HYSTEROSCOPY WITH THERMACHOICE ABLATION
Anesthesia: General

## 2014-04-27 ENCOUNTER — Encounter: Payer: BC Managed Care – PPO | Admitting: Obstetrics & Gynecology

## 2014-04-30 ENCOUNTER — Telehealth: Payer: Self-pay | Admitting: Adult Health

## 2014-04-30 MED ORDER — MEGESTROL ACETATE 40 MG PO TABS: ORAL_TABLET | ORAL | Status: DC

## 2014-04-30 NOTE — Telephone Encounter (Signed)
Pt had to cancel surgery did not have $500 co pay, started bleeding again 6/7 will refill megace and take MV with iron and make appt to follow up with  Dr Elonda Husky

## 2014-05-11 ENCOUNTER — Ambulatory Visit: Payer: BC Managed Care – PPO | Admitting: Obstetrics & Gynecology

## 2014-05-11 NOTE — Progress Notes (Signed)
Jill Battiest, MD [place] [address]  Invasive ductal carcinoma of right breast  Iron deficiency anemia, unspecified - Plan: CBC, Ferritin, CBC, Ferritin  CURRENT THERAPY: She was prescribed Tamoxifen 20 mg daily for PR positivity greater than 1% based off of [facility] recommendations by Dr. Orlene Och on 04/28/2013, but she did not start due to fear of the medication.  INTERVAL HISTORY: [name] 48 y.o. 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 This was an ER negative, PR 2%, Ki-67 marker high at 90%, Her2 negative cancer. S/P lumpectomy followed by adjuvant chemotherapy with 4 cycles of AC followed by 12 weekly Taxol chemotherapy completed on 10/19/11. She also received breast irradiation completed in 12/31/2011. She was prescribed Tamoxifen 20 mg daily for PR positivity greater than 1% based off of [facility] recommendations by Dr. Orlene Och on 04/28/2013, but she did not start due to fear of the medication.    Invasive ductal carcinoma of right breast   02/28/2011 Initial Diagnosis Right needle core biopsy demonstrating Invasive ductal carcinoma of right breast   03/21/2011 Surgery Right lumpectomy demonstrating a 3.8 cm invasive ductal carcinoma, grade III, no LVI, and 0/1 lymph nodes   05/11/2011 - 07/13/2011 Chemotherapy AC x 4   08/03/2011 - 10/19/2011 Chemotherapy Paclitaxel weekly x 12   11/07/2011 - 12/31/2011 Radiation Therapy    04/28/2013 -  Chemotherapy Tamoxifen    [place] reported to the ED on 4/21 with vaginal bleeding. She has subsequently seen Dr. Elonda [name] who is managing this issue on 4/22. Of note, she is on ASA. Additionally, she is on Megace for menstruation control. She is considering Oophorectomy by Dr. Elonda [name], which from an oncology standpoint would be fine. If she were to have this performed, I would recommend total hysterectomy.  Once she recovers, I would recommend an an aromatase inhibitor x 5 years (with monitoring of bone health) due to her weak PR 2% positivity. Until her abnormal vaginal bleeding is resolved, we can talk about Tamoxifen versus Aromatse inhibitor based upon her surgical options provided to her by Dr. Elonda [name]. Her chart is reviewed and there is a telephone encounter dictated by Derrek Monaco on 6/12 stating that surgery is on hold because the patient cannot afford the $500 co-pay for hysterectomy.  She was started on Megace by her Gynecologist for vaginal bleeding which is effectively acting like Tamoxifen from an oncology standpoint.  Therefore, she is asked to continue to hold Tamoxifen for the time being.  Long-term Megace is not a good solution due to her obesity.  Due to her abnormal vaginal bleeding, iron studies were performed on her last encounter and she demonstrated an iron deficiency anemia.  Therefore, IV Feraheme 1020 mg was given on 03/17/2014.  Her hgb has responded appropriately.   [name] reports that she is planning on undergoing an ablation for her vaginal bleeding and if that is ineffective, then pursue a hysterectomy while maintaining her ovaries.  From an oncology standpoint, we would recommend oophorectomy as well which would make her post-menopausal surgically allowing [place] to utilize an aromatase inhibitor which has been found to be more effective than Tamoxifen.  However, if oophorectomy is not performed, then we can use Tamoxifen until she is naturally post-menopausal and then switch to AI therapy.  I would favor the former.  We discussed the aforementioned information and [name] is agreeable.  Oncologically, she denies any complaints and ROS questioning is negative.   Past Medical History  Diagnosis Date  . Depression 06/22/2011  . GERD (gastroesophageal reflux disease) 06/22/2011  . DM (diabetes mellitus) 06/22/2011  . Hypercholesterolemia 06/22/2011  . Hypertension 06/22/2011  .  Obesity 06/22/2011  . Hx of adenomatous colonic polyps 10/2007    63m sigmoid tubular adenoma, FH colon cancer, mother in mid-50s  . Anxiety   . Breast cancer 05/22/2011    03/01/11, Stage 2, s/p lumpectomy, chemo/xrt  . DDD (degenerative disc disease), lumbar   . 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 Vaginal bleeding 03/08/2014  . Shortness of breath     has Invasive ductal carcinoma of right breast; Depression; GERD (gastroesophageal reflux disease); DM (diabetes mellitus); Hypercholesterolemia; Hypertension; Obesity; H/O adenomatous polyp of colon; FH: colon cancer; Esophageal dysphagia; Chronic diarrhea; Excessive or frequent menstruation; and Dysmenorrhea on her problem list.     has No Known Allergies.  Ms. Jill Hohdoes not currently have medications on file.  Past Surgical History  Procedure Laterality Date  . Back surg x2    . Cholecystectomy    . Mm breast stereo bx*l*r/s      rt.  . Mastectomy partial / lumpectomy      rt.  . Colonoscopy  11/03/07    4-mm sessile polyp removed/small internal hemorrhoids/tubular adenoma, random colon bx negative for microscopic colitis  . Portacath placement    . Port-a-cath removal  05/26/2012    Procedure: REMOVAL PORT-A-CATH;  Surgeon: MJamesetta So MD;  Location: [facility];  Service: General;  Laterality: N/A;  Minor Room    Denies any headaches, dizziness, double vision, fevers, chills, night sweats, nausea, vomiting, diarrhea, constipation, chest pain, heart palpitations, shortness of breath, blood in stool, black tarry stool, urinary pain, urinary burning, urinary frequency, hematuria.   PHYSICAL EXAMINATION  ECOG PERFORMANCE STATUS: 0 - Asymptomatic  Filed Vitals:   05/12/14 1136  BP: 128/83  Pulse: 90  Temp: 98.6 F (37 C)  Resp: 16    GENERAL:alert, no distress, well nourished, well developed, comfortable, cooperative, obese and smiling SKIN: skin color, texture, turgor are normal, no rashes or significant  lesions HEAD: Normocephalic, No masses, lesions, tenderness or abnormalities EYES: normal, PERRLA, EOMI, Conjunctiva are pink and non-injected EARS: External ears normal OROPHARYNX:lips, buccal mucosa, and tongue normal and mucous membranes are moist  NECK: supple, trachea midline LYMPH:  not examined BREAST:not examined LUNGS: not examined HEART: not examined ABDOMEN:obese BACK: Back symmetric, no curvature. EXTREMITIES:less then 2 second capillary refill, no skin discoloration, no clubbing, no cyanosis  NEURO: alert & oriented x 3 with fluent speech, no focal motor/sensory deficits, gait normal   LABORATORY DATA: CBC    Component Value Date/Time   WBC 6.2 04/09/2014 1305   RBC 4.04 04/09/2014 1305   HGB 11.6* 04/09/2014 1305   HCT 36.7 04/09/2014 1305   PLT 278 04/09/2014 1305   MCV 90.8 04/09/2014 1305   MCH 28.7 04/09/2014 1305   MCHC 31.6 04/09/2014 1305   RDW 18.3* 04/09/2014 1305   LYMPHSABS 2.4 03/12/2014 1204   MONOABS 0.7 03/12/2014 1204   EOSABS 0.0 03/12/2014 1204   BASOSABS 0.0 03/12/2014 1204      Chemistry      Component Value Date/Time   NA 141 04/09/2014 1305   K 3.7 04/09/2014 1305   CL 102 04/09/2014 1305   CO2 26 04/09/2014 1305   BUN 8 04/09/2014 1305   CREATININE 0.81 04/09/2014 1305  Component Value Date/Time   CALCIUM 9.3 04/09/2014 1305   ALKPHOS 38* 04/09/2014 1305   AST 18 04/09/2014 1305   ALT 26 04/09/2014 1305   BILITOT 0.3 04/09/2014 1305     Lab Results  Component Value Date   IRON 28* 03/12/2014   TIBC 395 03/12/2014   FERRITIN 10 03/12/2014      ASSESSMENT:  1. 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 This was an ER negative, PR 2%, Ki-67 marker high at 90%, Her2 negative cancer. S/P lumpectomy followed by adjuvant chemotherapy with 4 cycles of AC followed by 12 weekly Taxol chemotherapy completed on 10/19/11. She also received breast irradiation completed in 12/31/2011. Tamoxifen  recommended by Dr. Orlene Och, but was not started due to patient's fear and continued to be on hold secondary to abnormal vaginal bleeding being followed by Gyn.   2. Irregular menstrual bleeding, managed by Dr. Elonda [name]. Not Tamoxifen-induced as she has not started the medication.  Tamoxifen continued to be on hold. 3. Iron deficiency anemia, secondary to #2.  S/P IV Feraheme 1020 mg on 03/17/2014.  Patient Active Problem List   Diagnosis Date Noted  . Excessive or frequent menstruation 04/08/2014  . Dysmenorrhea 04/08/2014  . H/O adenomatous polyp of colon 01/24/2012  . FH: colon cancer 01/24/2012  . Esophageal dysphagia 01/24/2012  . Chronic diarrhea 01/24/2012  . Depression 06/22/2011  . GERD (gastroesophageal reflux disease) 06/22/2011  . DM (diabetes mellitus) 06/22/2011  . Hypercholesterolemia 06/22/2011  . Hypertension 06/22/2011  . Obesity 06/22/2011  . 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     PLAN:  1. I personally reviewed and went over laboratory results with the patient.  The results are noted within this dictation. 2. Continue followe-up with Gyn 3. Continue to hold Tamoxifen therapy until vaginal bleeding situation is addressed/corrected. 4. Continue Megace therapy for the short-term 5. Labs today: CBC diff, Ferritin 6. Return in 3-4 months for follow-up   THERAPY PLAN:  She is to follow-up with Gynecology to address her abnormal vaginal bleeding.  She is presently on Megace which effectively is acting like Tamoxifen from an oncology standpoint (although the medication is not indicated as such).  However, given her obesity, Megace is a short-term treatment due to its appetite stimulation and increased risk for VTE.  She plans on undergoing an ablation by Dr. Elonda [name].  If this works, then we will start Tamoxifen and transition to AI following menopausal transition.  If ablation fails, she will undergo hysterectomy, and per patient spare her ovaries.  I  recommend reconsideration of this as we would recommend oophorectomy as well.  This will allow for surgical menopause and we can use an aromatase inhibitor which has been shown to be more effective than Tamoxifen.    All questions were answered. The patient knows to call the clinic with any problems, questions or concerns. We can certainly see the patient much sooner if necessary.  Patient and plan discussed with Dr. Farrel Gobble and he is in agreement with the aforementioned.    KEFALAS,THOMAS 05/12/2014

## 2014-05-12 ENCOUNTER — Encounter (HOSPITAL_COMMUNITY): Payer: Self-pay | Admitting: Oncology

## 2014-05-12 ENCOUNTER — Encounter (HOSPITAL_COMMUNITY): Payer: BC Managed Care – PPO | Attending: Oncology | Admitting: Oncology

## 2014-05-12 VITALS — BP 128/83 | HR 90 | Temp 98.6°F | Resp 16 | Wt 247.6 lb

## 2014-05-12 DIAGNOSIS — D509 Iron deficiency anemia, unspecified: Secondary | ICD-10-CM

## 2014-05-12 DIAGNOSIS — N898 Other specified noninflammatory disorders of vagina: Secondary | ICD-10-CM

## 2014-05-12 DIAGNOSIS — E669 Obesity, unspecified: Secondary | ICD-10-CM

## 2014-05-12 DIAGNOSIS — Z171 Estrogen receptor negative status [ER-]: Secondary | ICD-10-CM

## 2014-05-12 DIAGNOSIS — Z853 Personal history of malignant neoplasm of breast: Secondary | ICD-10-CM

## 2014-05-12 DIAGNOSIS — C50911 Malignant neoplasm of unspecified site of right female breast: Secondary | ICD-10-CM

## 2014-05-12 DIAGNOSIS — D500 Iron deficiency anemia secondary to blood loss (chronic): Secondary | ICD-10-CM

## 2014-05-12 LAB — CBC
HCT: 39.2 % (ref 36.0–46.0)
Hemoglobin: 12.9 g/dL (ref 12.0–15.0)
MCH: 29.4 pg (ref 26.0–34.0)
MCHC: 32.9 g/dL (ref 30.0–36.0)
MCV: 89.3 fL (ref 78.0–100.0)
Platelets: 261 10*3/uL (ref 150–400)
RBC: 4.39 MIL/uL (ref 3.87–5.11)
RDW: 16.1 % — ABNORMAL HIGH (ref 11.5–15.5)
WBC: 8.9 10*3/uL (ref 4.0–10.5)

## 2014-05-12 NOTE — Patient Instructions (Addendum)
[facility] Discharge Instructions  RECOMMENDATIONS MADE BY THE CONSULTANT AND ANY TEST RESULTS WILL BE SENT TO YOUR REFERRING PHYSICIAN.  EXAM FINDINGS BY THE PHYSICIAN TODAY AND SIGNS OR SYMPTOMS TO REPORT TO CLINIC OR PRIMARY PHYSICIAN: You saw [name] today  Labs today CBC and ferritin, and follow up in 3-4 months  Thank you for choosing [facility] to provide your oncology and hematology care.  To afford each patient quality time with our providers, please arrive at least 15 minutes before your scheduled appointment time.  With your help, our goal is to use those 15 minutes to complete the necessary work-up to ensure our physicians have the information they need to help with your evaluation and healthcare recommendations.    Effective January 1st, 2014, we ask that you re-schedule your appointment with our physicians should you arrive 10 or more minutes late for your appointment.  We strive to give you quality time with our providers, and arriving late affects you and other patients whose appointments are after yours.    Again, thank you for choosing [facility].  Our hope is that these requests will decrease the amount of time that you wait before being seen by our physicians.       _____________________________________________________________  Should you have questions after your visit to [facility], please contact our office at [phone] between the hours of 8:30 a.m. and 5:00 p.m.  Voicemails left after 4:30 p.m. will not be returned until the following business day.  For prescription refill requests, have your pharmacy contact our office with your prescription refill request.

## 2014-05-13 LAB — FERRITIN: Ferritin: 49 ng/mL (ref 10–291)

## 2014-06-04 ENCOUNTER — Ambulatory Visit (INDEPENDENT_AMBULATORY_CARE_PROVIDER_SITE_OTHER): Payer: BC Managed Care – PPO | Admitting: Nurse Practitioner

## 2014-06-04 ENCOUNTER — Encounter: Payer: Self-pay | Admitting: Nurse Practitioner

## 2014-06-04 VITALS — BP 120/70 | Ht 64.0 in | Wt 250.4 lb

## 2014-06-04 DIAGNOSIS — F3289 Other specified depressive episodes: Secondary | ICD-10-CM

## 2014-06-04 DIAGNOSIS — N920 Excessive and frequent menstruation with regular cycle: Secondary | ICD-10-CM

## 2014-06-04 DIAGNOSIS — E7800 Pure hypercholesterolemia, unspecified: Secondary | ICD-10-CM

## 2014-06-04 DIAGNOSIS — E119 Type 2 diabetes mellitus without complications: Secondary | ICD-10-CM

## 2014-06-04 DIAGNOSIS — Z79899 Other long term (current) drug therapy: Secondary | ICD-10-CM

## 2014-06-04 DIAGNOSIS — F32A Depression, unspecified: Secondary | ICD-10-CM

## 2014-06-04 DIAGNOSIS — F329 Major depressive disorder, single episode, unspecified: Secondary | ICD-10-CM

## 2014-06-04 DIAGNOSIS — I10 Essential (primary) hypertension: Secondary | ICD-10-CM

## 2014-06-04 DIAGNOSIS — E669 Obesity, unspecified: Secondary | ICD-10-CM

## 2014-06-04 DIAGNOSIS — R5383 Other fatigue: Secondary | ICD-10-CM

## 2014-06-04 DIAGNOSIS — R5381 Other malaise: Secondary | ICD-10-CM

## 2014-06-04 DIAGNOSIS — K219 Gastro-esophageal reflux disease without esophagitis: Secondary | ICD-10-CM

## 2014-06-04 MED ORDER — METFORMIN HCL 500 MG PO TABS: 500.0000 mg | ORAL_TABLET | Freq: Two times a day (BID) | ORAL | Status: DC

## 2014-06-04 MED ORDER — ROSUVASTATIN CALCIUM 10 MG PO TABS: 10.0000 mg | ORAL_TABLET | Freq: Every day | ORAL | Status: DC

## 2014-06-04 MED ORDER — CITALOPRAM HYDROBROMIDE 40 MG PO TABS: 40.0000 mg | ORAL_TABLET | Freq: Every day | ORAL | Status: DC

## 2014-06-04 MED ORDER — LISINOPRIL-HYDROCHLOROTHIAZIDE 10-12.5 MG PO TABS: 1.0000 | ORAL_TABLET | Freq: Every day | ORAL | Status: DC

## 2014-06-04 NOTE — Patient Instructions (Addendum)
Endometrial ablation [facility]

## 2014-06-05 ENCOUNTER — Encounter: Payer: Self-pay | Admitting: Nurse Practitioner

## 2014-06-05 LAB — HEPATIC FUNCTION PANEL
ALT: 13 U/L (ref 0–35)
AST: 11 U/L (ref 0–37)
Albumin: 3.8 g/dL (ref 3.5–5.2)
Alkaline Phosphatase: 39 U/L (ref 39–117)
BILIRUBIN DIRECT: 0.1 mg/dL (ref 0.0–0.3)
Indirect Bilirubin: 0.2 mg/dL (ref 0.2–1.2)
Total Bilirubin: 0.3 mg/dL (ref 0.2–1.2)
Total Protein: 6.6 g/dL (ref 6.0–8.3)

## 2014-06-05 LAB — LIPID PANEL
Cholesterol: 163 mg/dL (ref 0–200)
HDL: 34 mg/dL — ABNORMAL LOW (ref >39)
LDL Cholesterol: 93 mg/dL (ref 0–99)
Total CHOL/HDL Ratio: 4.8 Ratio
Triglycerides: 178 mg/dL — ABNORMAL HIGH (ref <150)
VLDL: 36 mg/dL (ref 0–40)

## 2014-06-05 LAB — BASIC METABOLIC PANEL
BUN: 10 mg/dL (ref 6–23)
CO2: 28 mEq/L (ref 19–32)
Calcium: 8.8 mg/dL (ref 8.4–10.5)
Chloride: 106 mEq/L (ref 96–112)
Creat: 0.78 mg/dL (ref 0.50–1.10)
Glucose, Bld: 112 mg/dL — ABNORMAL HIGH (ref 70–99)
POTASSIUM: 4.5 meq/L (ref 3.5–5.3)
Sodium: 142 mEq/L (ref 135–145)

## 2014-06-05 LAB — HEMOGLOBIN A1C
Hgb A1c MFr Bld: 6.4 % — ABNORMAL HIGH (ref <5.7)
Mean Plasma Glucose: 137 mg/dL — ABNORMAL HIGH (ref <117)

## 2014-06-05 LAB — TSH: TSH: 2.066 u[IU]/mL (ref 0.350–4.500)

## 2014-06-05 NOTE — Progress Notes (Signed)
Subjective:  Presents for routine followup. Because of her history of breast cancer, tamoxifen therapy for 5 years has been recommended. Currently on Megace once a day. Before this can be done, endometrial ablation has been recommended. Patient had pursued her scheduled but specialist needed $500 cash up front it would not work with patient about making payments. Patient is a single mom with limited budget. Mental health has been weaning her off her Xanax. Her symptoms are stable on Celexa 40 mg. Currently on 3 separate prescriptions for blood pressure, wondering if she can combine some of these to help with cost. No chest pain/ischemic type pain or shortness of breath. Nonsmoker. No regular exercise. Working full-time. Some fatigue. Reflux stable, occasional problem depending on what she eats.  Objective:   BP 120/70  Ht 5\' 4"  (1.626 m)  Wt 250 lb 6 oz (113.569 kg)  BMI 42.96 kg/m2 NAD. Alert, oriented. Lungs clear. Heart regular rate rhythm. Significant central obesity noted. Abdomen obese soft non-tender. Diabetic foot exam: Strong DP pulses bilaterally, toes warm with good capillary refill. Monofilament test normal.  Assessment:  Problem List Items Addressed This Visit     Cardiovascular and Mediastinum   Hypertension - Primary   Relevant Medications      rosuvastatin (CRESTOR) tablet      LISINOPRIL-HCTZ 10-12.5 MG PO TABS   Other Relevant Orders      Lipid panel     Digestive   GERD (gastroesophageal reflux disease)     Endocrine   DM (diabetes mellitus)   Relevant Medications      rosuvastatin (CRESTOR) tablet      metFORMIN (GLUCOPHAGE) tablet      LISINOPRIL-HCTZ 10-12.5 MG PO TABS   Other Relevant Orders      Microalbumin, urine      Hemoglobin A1c     Other   Depression   Relevant Medications      citalopram (CELEXA) tablet   Hypercholesterolemia   Relevant Medications      rosuvastatin (CRESTOR) tablet      LISINOPRIL-HCTZ 10-12.5 MG PO TABS   Other Relevant  Orders      Lipid panel   Obesity   Relevant Medications      metFORMIN (GLUCOPHAGE) tablet    Other Visit Diagnoses   Other malaise and fatigue        Relevant Orders       TSH    Encounter for long-term (current) use of other medications        Relevant Orders       Hepatic function panel       Basic metabolic panel      continue Megace for now. Increase dose back to 3 times a day if any heavy bleeding. Will have our office see if another gynecologist will work with her on her deductible. Recommend eye exam. Given prescription for glucose testing machine strips and lancets. Stop metoprolol. Switch to lisinopril/HCTZ 10/12.5. Monitor BP outside the office and call if any problems. Return in about 3 months (around 09/04/2014).

## 2014-06-06 LAB — MICROALBUMIN, URINE: Microalb, Ur: 1.47 mg/dL (ref 0.00–1.89)

## 2014-06-09 ENCOUNTER — Encounter: Payer: Self-pay | Admitting: Nurse Practitioner

## 2014-06-10 ENCOUNTER — Telehealth: Payer: Self-pay | Admitting: Family Medicine

## 2014-06-10 ENCOUNTER — Other Ambulatory Visit: Payer: Self-pay | Admitting: Nurse Practitioner

## 2014-06-10 MED ORDER — PANTOPRAZOLE SODIUM 40 MG PO TBEC: 40.0000 mg | DELAYED_RELEASE_TABLET | Freq: Every day | ORAL | Status: DC

## 2014-06-10 NOTE — Telephone Encounter (Signed)
Was seen Friday by [name], we forgot to send in Rx for GERD, states she used to be on Nexium, [name] mentioned that she would send in something else due to pt's insurance, nothing was ever sent in, pt uses [facility] Please call pt when done

## 2014-06-10 NOTE — Telephone Encounter (Signed)
Patient notified

## 2014-06-10 NOTE — Telephone Encounter (Signed)
Sorry misunderstood. Thought she was going to do OTC. Will send in Rx today.

## 2014-06-23 ENCOUNTER — Other Ambulatory Visit: Payer: Self-pay | Admitting: Nurse Practitioner

## 2014-06-23 MED ORDER — METFORMIN HCL 500 MG PO TABS: 500.0000 mg | ORAL_TABLET | Freq: Two times a day (BID) | ORAL | Status: DC

## 2014-06-23 MED ORDER — METOPROLOL TARTRATE 25 MG PO TABS: 25.0000 mg | ORAL_TABLET | Freq: Every morning | ORAL | Status: DC

## 2014-06-23 MED ORDER — CITALOPRAM HYDROBROMIDE 40 MG PO TABS: 40.0000 mg | ORAL_TABLET | Freq: Every day | ORAL | Status: DC

## 2014-06-23 MED ORDER — ROSUVASTATIN CALCIUM 10 MG PO TABS: 10.0000 mg | ORAL_TABLET | Freq: Every day | ORAL | Status: DC

## 2014-06-23 MED ORDER — PANTOPRAZOLE SODIUM 40 MG PO TBEC: 40.0000 mg | DELAYED_RELEASE_TABLET | Freq: Every day | ORAL | Status: DC

## 2014-06-23 MED ORDER — HYDROCHLOROTHIAZIDE 25 MG PO TABS: 12.5000 mg | ORAL_TABLET | Freq: Every day | ORAL | Status: DC

## 2014-06-23 MED ORDER — LISINOPRIL-HYDROCHLOROTHIAZIDE 10-12.5 MG PO TABS: 1.0000 | ORAL_TABLET | Freq: Every day | ORAL | Status: DC

## 2014-07-14 ENCOUNTER — Ambulatory Visit: Payer: BC Managed Care – PPO | Admitting: Obstetrics and Gynecology

## 2014-07-21 ENCOUNTER — Ambulatory Visit: Payer: BC Managed Care – PPO | Admitting: Obstetrics and Gynecology

## 2014-08-06 ENCOUNTER — Emergency Department (HOSPITAL_COMMUNITY)
Admission: EM | Admit: 2014-08-06 | Discharge: 2014-08-06 | Disposition: A | Discharge status: Home / Self Care | Payer: BC Managed Care – PPO | Attending: Emergency Medicine | Admitting: Emergency Medicine

## 2014-08-06 ENCOUNTER — Emergency Department (HOSPITAL_COMMUNITY): Payer: BC Managed Care – PPO

## 2014-08-06 ENCOUNTER — Encounter (HOSPITAL_COMMUNITY): Payer: Self-pay | Admitting: Emergency Medicine

## 2014-08-06 DIAGNOSIS — I10 Essential (primary) hypertension: Secondary | ICD-10-CM | POA: Diagnosis not present

## 2014-08-06 DIAGNOSIS — E669 Obesity, unspecified: Secondary | ICD-10-CM | POA: Insufficient documentation

## 2014-08-06 DIAGNOSIS — J209 Acute bronchitis, unspecified: Secondary | ICD-10-CM

## 2014-08-06 DIAGNOSIS — R059 Cough, unspecified: Secondary | ICD-10-CM | POA: Insufficient documentation

## 2014-08-06 DIAGNOSIS — K219 Gastro-esophageal reflux disease without esophagitis: Secondary | ICD-10-CM | POA: Diagnosis not present

## 2014-08-06 DIAGNOSIS — R42 Dizziness and giddiness: Secondary | ICD-10-CM | POA: Insufficient documentation

## 2014-08-06 DIAGNOSIS — F411 Generalized anxiety disorder: Secondary | ICD-10-CM | POA: Insufficient documentation

## 2014-08-06 DIAGNOSIS — Z7982 Long term (current) use of aspirin: Secondary | ICD-10-CM | POA: Diagnosis not present

## 2014-08-06 DIAGNOSIS — E7800 Pure hypercholesterolemia, unspecified: Secondary | ICD-10-CM | POA: Insufficient documentation

## 2014-08-06 DIAGNOSIS — Z853 Personal history of malignant neoplasm of breast: Secondary | ICD-10-CM | POA: Diagnosis not present

## 2014-08-06 DIAGNOSIS — Z8742 Personal history of other diseases of the female genital tract: Secondary | ICD-10-CM | POA: Insufficient documentation

## 2014-08-06 DIAGNOSIS — R1110 Vomiting, unspecified: Secondary | ICD-10-CM | POA: Diagnosis not present

## 2014-08-06 DIAGNOSIS — F3289 Other specified depressive episodes: Secondary | ICD-10-CM | POA: Insufficient documentation

## 2014-08-06 DIAGNOSIS — Z79899 Other long term (current) drug therapy: Secondary | ICD-10-CM | POA: Diagnosis not present

## 2014-08-06 DIAGNOSIS — Z8739 Personal history of other diseases of the musculoskeletal system and connective tissue: Secondary | ICD-10-CM | POA: Diagnosis not present

## 2014-08-06 DIAGNOSIS — F329 Major depressive disorder, single episode, unspecified: Secondary | ICD-10-CM | POA: Diagnosis not present

## 2014-08-06 DIAGNOSIS — Z860100 Personal history of colon polyps, unspecified: Secondary | ICD-10-CM | POA: Insufficient documentation

## 2014-08-06 DIAGNOSIS — E119 Type 2 diabetes mellitus without complications: Secondary | ICD-10-CM | POA: Insufficient documentation

## 2014-08-06 DIAGNOSIS — R05 Cough: Secondary | ICD-10-CM | POA: Insufficient documentation

## 2014-08-06 LAB — URINALYSIS, ROUTINE W REFLEX MICROSCOPIC
Bilirubin Urine: NEGATIVE
GLUCOSE, UA: NEGATIVE mg/dL
Ketones, ur: NEGATIVE mg/dL
Leukocytes, UA: NEGATIVE
Nitrite: NEGATIVE
Protein, ur: NEGATIVE mg/dL
SPECIFIC GRAVITY, URINE: 1.015 (ref 1.005–1.030)
Urobilinogen, UA: 0.2 mg/dL (ref 0.0–1.0)
pH: 6 (ref 5.0–8.0)

## 2014-08-06 LAB — CBG MONITORING, ED: GLUCOSE-CAPILLARY: 100 mg/dL — AB (ref 70–99)

## 2014-08-06 LAB — URINE MICROSCOPIC-ADD ON

## 2014-08-06 MED ORDER — IPRATROPIUM-ALBUTEROL 0.5-2.5 (3) MG/3ML IN SOLN
3.0000 mL | Freq: Once | RESPIRATORY_TRACT | Status: AC
  Administered 2014-08-06: 3 mL via RESPIRATORY_TRACT
  Filled 2014-08-06: qty 3

## 2014-08-06 MED ORDER — ALBUTEROL SULFATE (2.5 MG/3ML) 0.083% IN NEBU
2.5000 mg | INHALATION_SOLUTION | Freq: Once | RESPIRATORY_TRACT | Status: AC
  Administered 2014-08-06: 2.5 mg via RESPIRATORY_TRACT
  Filled 2014-08-06: qty 3

## 2014-08-06 MED ORDER — DOXYCYCLINE HYCLATE 100 MG PO TABS
100.0000 mg | ORAL_TABLET | Freq: Once | ORAL | Status: AC
  Administered 2014-08-06: 100 mg via ORAL
  Filled 2014-08-06: qty 1

## 2014-08-06 MED ORDER — PREDNISONE 20 MG PO TABS: ORAL_TABLET | ORAL | Status: DC

## 2014-08-06 MED ORDER — AZITHROMYCIN 250 MG PO TABS: ORAL_TABLET | ORAL | Status: DC

## 2014-08-06 MED ORDER — DOXYCYCLINE HYCLATE 100 MG PO CAPS: 100.0000 mg | ORAL_CAPSULE | Freq: Two times a day (BID) | ORAL | Status: DC

## 2014-08-06 MED ORDER — PREDNISONE 20 MG PO TABS
40.0000 mg | ORAL_TABLET | Freq: Once | ORAL | Status: AC
  Administered 2014-08-06: 40 mg via ORAL
  Filled 2014-08-06: qty 2

## 2014-08-06 MED ORDER — HYDROCOD POLST-CHLORPHEN POLST 10-8 MG/5ML PO LQCR: 5.0000 mL | Freq: Two times a day (BID) | ORAL | Status: DC | PRN

## 2014-08-06 MED ORDER — HYDROCOD POLST-CHLORPHEN POLST 10-8 MG/5ML PO LQCR
5.0000 mL | Freq: Once | ORAL | Status: AC
  Administered 2014-08-06: 5 mL via ORAL
  Filled 2014-08-06: qty 5

## 2014-08-06 MED ORDER — AZITHROMYCIN 250 MG PO TABS: 500.0000 mg | ORAL_TABLET | Freq: Every day | ORAL | Status: DC

## 2014-08-06 NOTE — Discharge Instructions (Signed)

## 2014-08-06 NOTE — ED Notes (Signed)
resp here to give [facility]

## 2014-08-06 NOTE — ED Notes (Signed)
Patient presents w/1 week of cough and chest congestion.  Cough is productive w/ yellow phlegm. She is having soreness in upper chest from coughing.  Relates she had fever of 100 yesterday.  Denies chills.  Had episode of vomiting today but has had 3-4 BMs each day this week.

## 2014-08-06 NOTE — ED Notes (Signed)
Diarrhea for 1 week, x4 today, vomited x1 today, cough with yellow sputum.  Feels "light headed " at times.

## 2014-08-06 NOTE — ED Provider Notes (Signed)
CSN: [id number]     Arrival date & time 08/06/14  2131 History   First MD Initiated Contact with Patient 08/06/14 2147     Chief Complaint  Patient presents with  . Cough     (Consider location/radiation/quality/duration/timing/severity/associated sxs/prior Treatment) The history is provided by the patient.   [name] is a 48 y.o. female presenting with a one week history of cough productive of yellow sputum, wheezing along with intermittent shortness of breath and burning upper bilateral chest pain which is triggered by cough.  She has been able to tolerate PO intake, but her appetite is reduced.  She reports one episode of transient lightheadedness this am when she first stood up, but not since.  She reports low grade fever of 100 yesterday.  She had one episode of post tussive emesis this morning, but denies nausea or additional vomiting.  She has had loose stools this with without overt diarrhea, reporting 3-4 bowel movements per day.  She is using her albuterol mdi, mostly qhs this week without relief.   This was given her with her last episode of bronchitis which reminds her of todays presenting symptoms.  She does not smoke but is exposed passively.       Past Medical History  Diagnosis Date  . Depression 06/22/2011  . GERD (gastroesophageal reflux disease) 06/22/2011  . DM (diabetes mellitus) 06/22/2011  . Hypercholesterolemia 06/22/2011  . Hypertension 06/22/2011  . Obesity 06/22/2011  . Hx of adenomatous colonic polyps 10/2007    61mm sigmoid tubular adenoma, FH colon cancer, mother in mid-50s  . Anxiety   . Breast cancer 05/22/2011    03/01/11, Stage 2, s/p lumpectomy, chemo/xrt  . DDD (degenerative disc disease), lumbar   . 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 Vaginal bleeding 03/08/2014  . Shortness of breath    Past Surgical History  Procedure Laterality Date  . Back surg x2    . Cholecystectomy    . Mm breast stereo bx*l*r/s      rt.  . Mastectomy  partial / lumpectomy      rt.  . Colonoscopy  11/03/07    4-mm sessile polyp removed/small internal hemorrhoids/tubular adenoma, random colon bx negative for microscopic colitis  . Portacath placement    . Port-a-cath removal  05/26/2012    Procedure: REMOVAL PORT-A-CATH;  Surgeon: Jamesetta So, MD;  Location: [facility];  Service: General;  Laterality: N/A;  Minor Room   Family History  Problem Relation Age of Onset  . Colon cancer Mother     mid-50s, died of metastatic disease  . Cancer Mother     liver  . Coronary artery disease Mother   . Diabetes type I Mother   . Coronary artery disease Father   . Diabetes Father   . Diabetes type I Father   . Hypertension Father   . Bipolar disorder Sister   . Coronary artery disease Brother   . Hypertension Brother   . Hypertension Maternal Grandmother   . Hyperlipidemia Maternal Grandmother   . Stroke Maternal Grandmother   . Hypertension Maternal Grandfather   . Diabetes Maternal Grandfather   . Diabetes Paternal Grandmother   . Heart disease Paternal Grandfather    History  Substance Use Topics  . Smoking status: Never Smoker   . Smokeless tobacco: Never Used  . Alcohol Use: No   OB History   Grav Para Term Preterm Abortions TAB SAB Ect Mult Living   4 3 0 0  1 0 0 0 0 3     Review of Systems  Constitutional: Positive for fever and fatigue. Negative for chills.  HENT: Negative for congestion, rhinorrhea and sore throat.   Eyes: Negative.   Respiratory: Positive for cough, shortness of breath and wheezing. Negative for chest tightness.   Cardiovascular: Positive for chest pain.  Gastrointestinal: Positive for vomiting. Negative for nausea and abdominal pain.  Genitourinary: Negative.   Musculoskeletal: Negative for arthralgias, joint swelling and neck pain.  Skin: Negative.  Negative for rash and wound.  Neurological: Positive for light-headedness. Negative for dizziness, weakness, numbness and headaches.    Psychiatric/Behavioral: Negative.       Allergies  Review of patient's allergies indicates no known allergies.  Home Medications   Prior to Admission medications   Medication Sig Start Date End Date Taking? Authorizing Provider  albuterol (PROVENTIL HFA;VENTOLIN HFA) 108 (90 BASE) MCG/ACT inhaler Inhale 2 puffs into the lungs every 4 (four) hours as needed for wheezing. 03/08/14  Yes Mikey Kirschner, MD  ALPRAZolam Duanne Moron) 1 MG tablet Take 1 tablet by mouth 3 (three) times daily as needed for anxiety.  03/25/14  Yes Historical Provider, MD  aspirin EC 81 MG tablet Take 81 mg by mouth daily.   Yes Historical Provider, MD  citalopram (CELEXA) 40 MG tablet Take 1 tablet (40 mg total) by mouth daily. 06/23/14  Yes Nilda Simmer, NP  lisinopril-hydrochlorothiazide (PRINZIDE,ZESTORETIC) 10-12.5 MG per tablet Take 1 tablet by mouth daily. 06/23/14  Yes Nilda Simmer, NP  megestrol (MEGACE) 40 MG tablet Take 3 daily at one time 04/30/14  Yes Estill Dooms, NP  metFORMIN (GLUCOPHAGE) 500 MG tablet Take 1 tablet (500 mg total) by mouth 2 (two) times daily. 06/23/14  Yes Nilda Simmer, NP  pantoprazole (PROTONIX) 40 MG tablet Take 1 tablet (40 mg total) by mouth daily. For acid reflux 06/23/14  Yes Nilda Simmer, NP  rosuvastatin (CRESTOR) 10 MG tablet Take 1 tablet (10 mg total) by mouth at bedtime. 06/23/14  Yes Nilda Simmer, NP  chlorpheniramine-HYDROcodone (TUSSIONEX PENNKINETIC ER) 10-8 MG/5ML LQCR Take 5 mLs by mouth every 12 (twelve) hours as needed for cough. 08/06/14   Evalee Jefferson, PA-C  doxycycline (VIBRAMYCIN) 100 MG capsule Take 1 capsule (100 mg total) by mouth 2 (two) times daily. 08/06/14   Evalee Jefferson, PA-C  Omega-3 Fatty Acids (FISH OIL) 1200 MG CAPS Take 1 capsule by mouth 2 (two) times daily.    Historical Provider, MD  predniSONE (DELTASONE) 20 MG tablet Take 2 tablets daily for 4 days 08/06/14   Evalee Jefferson, PA-C   BP 99/62  Pulse 94  Temp(Src) 98.7 F (37.1 C) (Oral)   Resp 18  Ht 5\' 4"  (1.626 m)  Wt 246 lb (111.585 kg)  BMI 42.21 kg/m2  SpO2 98% Physical Exam  Nursing note and vitals reviewed. Constitutional: She appears well-developed and well-nourished.  Orthostatics obtained with no symptoms reported.  HENT:  Head: Normocephalic and atraumatic.  Mouth/Throat: Oropharynx is clear and moist.  Eyes: Conjunctivae are normal.  Neck: Normal range of motion.  Cardiovascular: Normal rate, regular rhythm, normal heart sounds and intact distal pulses.   Pulmonary/Chest: Effort normal. She has decreased breath sounds. She has wheezes. She has no rhonchi. She has no rales. She exhibits tenderness.    Expiratory wheeze at bilateral bases.  Prolonged expirations with reduced aeration throughout.    Abdominal: Soft. Bowel sounds are normal. There is no tenderness.  Musculoskeletal: Normal range  of motion. She exhibits no edema.  Lymphadenopathy:    She has no cervical adenopathy.  Neurological: She is alert.  Skin: Skin is warm and dry.  Psychiatric: She has a normal mood and affect.    ED Course  Procedures (including critical care time) Labs Review Labs Reviewed  URINALYSIS, ROUTINE W REFLEX MICROSCOPIC - Abnormal; Notable for the following:    APPearance HAZY (*)    Hgb urine dipstick LARGE (*)    All other components within normal limits  URINE MICROSCOPIC-ADD ON - Abnormal; Notable for the following:    Squamous Epithelial / LPF FEW (*)    All other components within normal limits  CBG MONITORING, ED - Abnormal; Notable for the following:    Glucose-Capillary 100 (*)    All other components within normal limits    Imaging Review Dg Chest 2 View  08/06/2014   CLINICAL DATA:  Cough.  EXAM: CHEST  2 VIEW  COMPARISON:  Chest radiograph performed 10/04/2012  FINDINGS: The lungs are well-aerated and clear. There is no evidence of focal opacification, pleural effusion or pneumothorax.  The heart is normal in size; the mediastinal contour is  within normal limits. No acute osseous abnormalities are seen.  IMPRESSION: No acute cardiopulmonary process seen.   Electronically Signed   By: Garald Balding M.D.   On: 08/06/2014 22:23     EKG Interpretation None      MDM   Final diagnoses:  Bronchitis with bronchospasm    Patients labs and/or radiological studies were viewed and considered during the medical decision making and disposition process. Pt's labs stable, hematuria - pt reports she currently has menstrual spotting, this was not a cath specimen.  She was given albuterol/atrovent neb with moderate improvement in wheezing and air movement, pt felt better.  She was also given prednisone orally, tussionex for cough relief.  Pt's xray reviewed with no pneumonia. Given risk factors for worsening infection (diabetes), will tx with abx. Doxycycline prescribed.  Reduced prednisone pulse dose 40 mg, tussionex prescribed.  Advised prn f/u with pcp and /or return here for any worsened sx.   Continue with home mdi q 4 prn.    Evalee Jefferson, PA-C 08/07/14 1330

## 2014-08-07 NOTE — ED Provider Notes (Signed)
Medical screening examination/treatment/procedure(s) were performed by non-physician practitioner and as supervising physician I was immediately available for consultation/collaboration.   EKG Interpretation None        Hoy Morn, MD 08/07/14 1510

## 2014-08-13 ENCOUNTER — Ambulatory Visit: Payer: BC Managed Care – PPO | Admitting: Obstetrics and Gynecology

## 2014-08-26 ENCOUNTER — Ambulatory Visit (HOSPITAL_COMMUNITY): Payer: BC Managed Care – PPO

## 2014-09-03 ENCOUNTER — Ambulatory Visit: Payer: BC Managed Care – PPO | Admitting: Nurse Practitioner

## 2014-09-08 ENCOUNTER — Ambulatory Visit (INDEPENDENT_AMBULATORY_CARE_PROVIDER_SITE_OTHER): Payer: BC Managed Care – PPO | Admitting: Obstetrics and Gynecology

## 2014-09-08 ENCOUNTER — Encounter: Payer: Self-pay | Admitting: Obstetrics and Gynecology

## 2014-09-08 VITALS — BP 122/76 | HR 62 | Resp 14 | Ht 63.75 in | Wt 244.0 lb

## 2014-09-08 DIAGNOSIS — N912 Amenorrhea, unspecified: Secondary | ICD-10-CM

## 2014-09-08 DIAGNOSIS — R102 Pelvic and perineal pain: Secondary | ICD-10-CM

## 2014-09-08 DIAGNOSIS — Z01419 Encounter for gynecological examination (general) (routine) without abnormal findings: Secondary | ICD-10-CM

## 2014-09-08 DIAGNOSIS — N949 Unspecified condition associated with female genital organs and menstrual cycle: Secondary | ICD-10-CM

## 2014-09-08 DIAGNOSIS — D259 Leiomyoma of uterus, unspecified: Secondary | ICD-10-CM

## 2014-09-08 LAB — CBC
HCT: 39.6 % (ref 36.0–46.0)
Hemoglobin: 13.4 g/dL (ref 12.0–15.0)
MCH: 28.9 pg (ref 26.0–34.0)
MCHC: 33.8 g/dL (ref 30.0–36.0)
MCV: 85.3 fL (ref 78.0–100.0)
Platelets: 262 10*3/uL (ref 150–400)
RBC: 4.64 MIL/uL (ref 3.87–5.11)
RDW: 15.9 % — ABNORMAL HIGH (ref 11.5–15.5)
WBC: 7 10*3/uL (ref 4.0–10.5)

## 2014-09-08 LAB — IRON: Iron: 78 ug/dL (ref 42–145)

## 2014-09-08 NOTE — Patient Instructions (Signed)

## 2014-09-08 NOTE — Progress Notes (Signed)
Patient ID: [name], female   DOB: 1966-04-30, 48 y.o.   MRN: [id number] GYNECOLOGY VISIT  PCP:   Baltazar Apo, MD  Referring provider:   HPI: 48 y.o.   Legally Separated  Caucasian  female   [phone] with Patient's last menstrual period was 02/17/2014.   here for evaluation of irregular/heavy menstrual cycles. Irregular cycles started 20 years ago.  Had heavy clotting when was seen in the Emergency Dept. At [facility] in April.   Patient 3 years post right lumpectomy for breast cancer, stage 2.  ER neg, PR 2%, HER2 negative.  She underwent chemo and radiation therapy.  No tamoxifen. She did not have any menstrual cycles from 02/2011 to 03/2012 while undergoing treatment for breast cancer.  Cycles resumed 03/2012 very heavy and with large clots.  Saw a gynecologist in [place] 02/2014 and he placed her on Megace.  This has helped with bleeding but patient still has breast tenderness and cramping like she feels when has cycles.  Patient is taking Megace 40 mg daily currently.  Was taking Megace 40 mg po tid.  Has symptoms of starting a menses but no actual bleeding other than a little pink drainage. Craving ice.   Had ultrasound which showed fibroids.  Had 3 and largest was 1.5 cm. EMS 11.3 and normal ovaries.  Was scheduled to have a dilation and curettage and patient did not have financial resources for that.  TSH nl 06/04/14. Hgb 12.9 05/12/14.  GYNECOLOGIC HISTORY: Patient's last menstrual period was 02/17/2014. Sexually active:  yes Partner preference: female Contraception:   none Menopausal hormone therapy: n/a DES exposure:   no Blood transfusions:  1999 for heavy menses  Sexually transmitted diseases: Genital warts (Pos.HPV), 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. GYN procedures and prior surgeries: Rt. Lumpectomy for breast cancer 02/2011.  Last mammogram:  11-25-13 wnl:[facility]               Last pap and high risk HPV testing: unsure History of abnormal pap smear:  2010 abnormal pap with colposcopy but no treatment to cervix.  Repeat pap was normal.  1990's had colposcopy with LEEP procedure to cervix. Had laser to condyloma in 1989 or 1990.   OB History   Grav Para Term Preterm Abortions TAB SAB Ect Mult Living   '4 3 0 0 1 0 0 0 0 3 '$       LIFESTYLE: Exercise:    no           Tobacco:   no Alcohol:   no Drug use:  no  Patient Active Problem List   Diagnosis Date Noted  . Excessive or frequent menstruation 04/08/2014  . Dysmenorrhea 04/08/2014  . H/O adenomatous polyp of colon 01/24/2012  . FH: colon cancer 01/24/2012  . Esophageal dysphagia 01/24/2012  . Chronic diarrhea 01/24/2012  . Depression 06/22/2011  . GERD (gastroesophageal reflux disease) 06/22/2011  . DM (diabetes mellitus) 06/22/2011  . Hypercholesterolemia 06/22/2011  . Hypertension 06/22/2011  . Obesity 06/22/2011  . 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 Depression 06/22/2011  . GERD (gastroesophageal reflux disease) 06/22/2011  . DM (diabetes mellitus) 06/22/2011  . Hypercholesterolemia 06/22/2011  . Hypertension 06/22/2011  . Obesity 06/22/2011  . Hx of adenomatous colonic polyps 10/2007    71mm sigmoid tubular adenoma, FH colon cancer, mother in mid-50s  . Anxiety   . Breast cancer 05/22/2011    03/01/11, Stage 2, s/p  lumpectomy, chemo/xrt  . DDD (degenerative disc disease), lumbar   . 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 Vaginal bleeding 03/08/2014  . Shortness of breath   . Blood transfusion without reported diagnosis 1999    due to heavy menses  . Genital warts   . 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 Back surg x2    . Cholecystectomy    . Mm breast stereo bx*l*r/s      rt.  . Mastectomy partial / lumpectomy      rt.  . Colonoscopy  11/03/07    4-mm sessile polyp removed/small internal  hemorrhoids/tubular adenoma, random colon bx negative for microscopic colitis  . Portacath placement    . Port-a-cath removal  05/26/2012    Procedure: REMOVAL PORT-A-CATH;  Surgeon: Jamesetta So, MD;  Location: [facility];  Service: General;  Laterality: N/A;  Minor Room    Current Outpatient Prescriptions  Medication Sig Dispense Refill  . albuterol (PROVENTIL HFA;VENTOLIN HFA) 108 (90 BASE) MCG/ACT inhaler Inhale 2 puffs into the lungs every 4 (four) hours as needed for wheezing.  1 Inhaler  5  . ALPRAZolam (XANAX) 1 MG tablet Take 1 tablet by mouth 3 (three) times daily as needed for anxiety.       Marland Kitchen aspirin EC 81 MG tablet Take 81 mg by mouth daily.      . citalopram (CELEXA) 40 MG tablet Take 1 tablet (40 mg total) by mouth daily.  90 tablet  1  . lisinopril-hydrochlorothiazide (PRINZIDE,ZESTORETIC) 10-12.5 MG per tablet Take 1 tablet by mouth daily.  90 tablet  1  . megestrol (MEGACE) 40 MG tablet Take 3 daily at one time  90 tablet  1  . metFORMIN (GLUCOPHAGE) 500 MG tablet Take 1 tablet (500 mg total) by mouth 2 (two) times daily.  180 tablet  1  . Omega-3 Fatty Acids (FISH OIL) 1200 MG CAPS Take 1 capsule by mouth 2 (two) times daily.      . pantoprazole (PROTONIX) 40 MG tablet Take 1 tablet (40 mg total) by mouth daily. For acid reflux  90 tablet  1  . rosuvastatin (CRESTOR) 10 MG tablet Take 1 tablet (10 mg total) by mouth at bedtime.  90 tablet  1  . [DISCONTINUED] gabapentin (NEURONTIN) 300 MG capsule Take 1 capsule (300 mg total) by mouth 3 (three) times daily.  100 capsule  3  . [DISCONTINUED] potassium chloride (KCL) 2 mEq/mL SOLN oral liquid Take 10 mL BID for 21 days, then 10 mL daily, PO  300 mL  1   No current facility-administered medications for this visit.     ALLERGIES: Review of patient's allergies indicates no known allergies.  Family History  Problem Relation Age of Onset  . Colon cancer Mother     mid-50s, died of metastatic disease  . Cancer Mother     liver   . Coronary artery disease Mother   . Diabetes type I Mother   . Coronary artery disease Father   . Diabetes Father   . Diabetes type I Father   . Hypertension Father   . Bipolar disorder Sister   . Coronary artery disease Brother   . Hypertension Brother   . Hypertension Maternal Grandmother   . Hyperlipidemia Maternal Grandmother   . Stroke Maternal Grandmother   . Hypertension Maternal Grandfather   . Diabetes Maternal Grandfather   .  Diabetes Paternal Grandmother   . Heart disease Paternal Grandfather     History   Social History  . Marital Status: Legally Separated    Spouse Name: N/A    Number of Children: 3  . 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 Smoking status: Never Smoker   . Smokeless tobacco: Never Used  . Alcohol Use: No  . Drug Use: No  . Sexual Activity: Yes    Partners: Male    Birth Control/ Protection: None   Other Topics Concern  . Not on file   Social History Narrative  . No narrative on file    ROS:  Pertinent items are noted in HPI.  PHYSICAL EXAMINATION:    BP 122/76  Pulse 62  Resp 14  Ht 5' 3.75" (1.619 m)  Wt 244 lb (110.678 kg)  BMI 42.22 kg/m2  LMP 02/17/2014   Wt Readings from Last 3 Encounters:  09/08/14 244 lb (110.678 kg)  08/06/14 246 lb (111.585 kg)  06/04/14 250 lb 6 oz (113.569 kg)     Ht Readings from Last 3 Encounters:  09/08/14 5' 3.75" (1.619 m)  08/06/14 $RemoveB'5\' 4"'PJroLDJg$  (1.626 m)  06/04/14 $RemoveB'5\' 4"'FFiAjRqT$  (1.626 m)    General appearance: alert, cooperative and appears stated age Head: Normocephalic, without obvious abnormality, atraumatic Neck: no adenopathy, supple, symmetrical, trachea midline and thyroid not enlarged, symmetric, no tenderness/mass/nodules Lungs: clear to auscultation bilaterally Breasts: Left -  Inspection negative, No nipple retraction or dimpling, No nipple discharge or bleeding, No axillary or supraclavicular adenopathy, Normal to palpation  without dominant masses Right - scar and radiation changes.  No dominant masses, axillary nodes.  Heart: regular rate and rhythm Abdomen: Pannus, soft, non-tender; no masses,  no organomegaly Extremities: extremities normal, atraumatic, no cyanosis or edema Skin: Skin color, texture, turgor normal. No rashes or lesions Lymph nodes: Cervical, supraclavicular, and axillary nodes normal. No abnormal inguinal nodes palpated Neurologic: Grossly normal  Pelvic: External genitalia:  no lesions              Urethra:  normal appearing urethra with no masses, tenderness or lesions              Bartholins and Skenes: normal                 Vagina: normal appearing vagina with normal color and discharge, no lesions              Cervix: normal appearance                 Bimanual Exam:  Uterus:  uterus is normal size, shape, consistency and nontender                                      Adnexa: normal adnexa in size, nontender and no masses                                      Rectovaginal: Confirms                                      Anus:  normal sphincter tone, no lesions  ASSESSMENT  Amenorrhea on Megace.  History of menorrhagia.  Uterine fibroids. Pica.  History of right breast cancer.  History of LEEP.  PLAN  [facility]ly [facility] in [facility].  CBC and iron.  Return for pelvic ultrasound and EMB in 8 days.  Has enough Megace to continue until that time.  Pap and HR HPV taken.   An After Visit Summary was printed and given to the patient.

## 2014-09-08 NOTE — Progress Notes (Signed)
PUS/Endo Bx scheduled for 09/16/14 0800,in office.

## 2014-09-09 ENCOUNTER — Telehealth: Payer: Self-pay | Admitting: Obstetrics and Gynecology

## 2014-09-09 NOTE — Telephone Encounter (Signed)
Spoke with patient. Advised that per benefit quote received, she will be responsible for a $5 copay when she comes in for PUS/EMB. Patient agreeable.

## 2014-09-09 NOTE — Addendum Note (Signed)
Addended by: Tacy Learn, Nakeitha Milligan E on: 09/09/2014 09:07 AM   Modules accepted: Orders

## 2014-09-09 NOTE — Telephone Encounter (Signed)
Message copied by Laqueta Due on Thu Sep 09, 2014 10:16 AM ------      Message from: Jaymes Graff      Created: Wed Sep 08, 2014  7:59 PM      Regarding: RE: pus endo bx       Ok, please call and advise her. Can you do a quick check of her insurance benefits and deductible info before her appointment next week. I am sorry I should have asked for this at the same time. She may be a surgical candidate and this info will likely impact her decision. You dont need to give her the surgical information yet. We just need to be prepared. We dont even know what she might need yet. Thank you.      ----- Message -----         From: Laqueta Due         Sent: 09/08/2014  10:46 AM           To: Huey Romans, RN      Subject: RE: pus endo bx                                          $5 copay      ----- Message -----         From: Huey Romans, RN         Sent: 09/08/2014  10:32 AM           To: Laqueta Due      Subject: pus endo bx                                              Please precert and let me know before calling her. sched for next week              ------

## 2014-09-13 LAB — IPS PAP TEST WITH HPV

## 2014-09-15 ENCOUNTER — Telehealth: Payer: Self-pay | Admitting: Obstetrics and Gynecology

## 2014-09-15 NOTE — Telephone Encounter (Signed)
Left message for patient to call back. Need to reschedule PUS °

## 2014-09-15 NOTE — Telephone Encounter (Signed)
Advised patient that I am calling to reschedule PUS because Dr Quincy Simmonds has emergency surgery tomorrow. Advised that Dr Quincy Simmonds strongly recommends that she still comes in tomorrow. Patient states that the ONLY time that she can come is at 8 due to her work schedule. Passed call to Lorelei Pont

## 2014-09-16 ENCOUNTER — Other Ambulatory Visit: Payer: BC Managed Care – PPO | Admitting: Obstetrics and Gynecology

## 2014-09-16 ENCOUNTER — Other Ambulatory Visit: Payer: BC Managed Care – PPO

## 2014-09-16 ENCOUNTER — Ambulatory Visit (INDEPENDENT_AMBULATORY_CARE_PROVIDER_SITE_OTHER): Payer: BC Managed Care – PPO

## 2014-09-16 ENCOUNTER — Ambulatory Visit (INDEPENDENT_AMBULATORY_CARE_PROVIDER_SITE_OTHER): Payer: BC Managed Care – PPO | Admitting: Obstetrics and Gynecology

## 2014-09-16 ENCOUNTER — Encounter: Payer: Self-pay | Admitting: Obstetrics and Gynecology

## 2014-09-16 VITALS — BP 126/84 | HR 84 | Ht 63.75 in | Wt 243.0 lb

## 2014-09-16 DIAGNOSIS — D259 Leiomyoma of uterus, unspecified: Secondary | ICD-10-CM

## 2014-09-16 DIAGNOSIS — N949 Unspecified condition associated with female genital organs and menstrual cycle: Secondary | ICD-10-CM

## 2014-09-16 DIAGNOSIS — R102 Pelvic and perineal pain: Secondary | ICD-10-CM

## 2014-09-16 DIAGNOSIS — R87610 Atypical squamous cells of undetermined significance on cytologic smear of cervix (ASC-US): Secondary | ICD-10-CM

## 2014-09-16 DIAGNOSIS — N912 Amenorrhea, unspecified: Secondary | ICD-10-CM

## 2014-09-16 NOTE — Patient Instructions (Signed)
Endometrial Biopsy, Care After Refer to this sheet in the next few weeks. These instructions provide you with information on caring for yourself after your procedure. Your health care provider may also give you more specific instructions. Your treatment has been planned according to current medical practices, but problems sometimes occur. Call your health care provider if you have any problems or questions after your procedure. WHAT TO EXPECT AFTER THE PROCEDURE After your procedure, it is typical to have the following:  You may have mild cramping and a small amount of vaginal bleeding for a few days after the procedure. This is normal. HOME CARE INSTRUCTIONS  Only take over-the-counter or prescription medicine as directed by your health care provider.  Do not douche, use tampons, or have sexual intercourse until your health care provider approves.  Follow your health care provider's instructions regarding any activity restrictions, such as strenuous exercise or heavy lifting. SEEK MEDICAL CARE IF:  You have heavy bleeding or bleeding longer than 2 days after the procedure.  You have bad smelling drainage from your vagina.  You have a fever and chills.  Youhave severe lower stomach (abdominal) pain. SEEK IMMEDIATE MEDICAL CARE IF:  You have severe cramps in your stomach or back.  You pass large blood clots.  Your bleeding increases.  You become weak or lightheaded, or you pass out. Document Released: 08/26/2013 Document Reviewed: 08/26/2013 Exit[employer] Patient Information 2015 [employer]. This information is not intended to replace advice given to you by your health care provider. Make sure you discuss any questions you have with your health care provider.  

## 2014-09-16 NOTE — Progress Notes (Signed)
Subjective  Patient is here for pelvic ultrasound and endometrial biopsy today.  LMP April. Contraception none. Last intercourse 3 weeks ago. UPT  Negative.  Has history of heavy bleeding treated with Megace.  Lots of cramping in pelvis. Did not have dilation and curettage earlier this year.   Recent pap showed ASCUS and positive HR HPV.  History of breast cancer.   Objective  See ultrasound images and report below.   3 fibroids - 1.28, 2.45, and 2.29 cm - intramural.  EMS 8.91 mm.  Ovaries normal.  No free fluid.     Assessment  History of menorrhagia.  Now with amenorrhea on Megace.  History of breast cancer.  Fibroids.  ASCUS pap with positive HR HPV.   Plan  Follow up endometrial biopsy.  OK to stop Megace.  Plan may be observation of bleeding if has normal endometrial biopsy.  Other options may be nonhormonal - uterine artery embolization, endometrial ablation, hysterectomy.   15 minutes face to face time of which over 50% was spent in counseling.   After visit summary to patient.

## 2014-09-20 ENCOUNTER — Encounter: Payer: Self-pay | Admitting: Obstetrics and Gynecology

## 2014-09-20 ENCOUNTER — Telehealth: Payer: Self-pay | Admitting: Obstetrics and Gynecology

## 2014-09-20 ENCOUNTER — Other Ambulatory Visit: Payer: Self-pay | Admitting: Obstetrics and Gynecology

## 2014-09-20 MED ORDER — MEGESTROL ACETATE 40 MG PO TABS: ORAL_TABLET | ORAL | Status: DC

## 2014-09-20 NOTE — Telephone Encounter (Signed)
Spoke with patient. Advised of message as seen below from [place]. Patient is agreeable and verbalizes understanding. Patient will start taking Megace until biopsy results are back. Patient will call back first thing tomorrow morning with update. Spoke with Caryl Pina regarding EMB results who states that the biopsy has not yet been resulted.

## 2014-09-20 NOTE — Telephone Encounter (Signed)
I suspect that the patient is having bleeding due to stopping Megace which she has been on for several months.   I would like to have her restart this until I can get the biopsy report back from her endometrial biopsy.  I will send in an order to her pharmacy for her to resume this.  Please contact [facility] to see if the result of EMB is back yet.   If the bleeding decreases, I would like to be able to see her myself in the office on Wednesday at 8:00 am. If the bleeding does not decrease she needs to be seen in the ER at [facility] or by Dr. Sabra Heck.   She needs to call for any fever.

## 2014-09-20 NOTE — Telephone Encounter (Signed)
Spoke with patient. Advised patient of importance of being evaluated for bleeding and pain today with MD. Liana Gerold patient appointment today at 3:30pm with Dr.Miller (time per Gay Filler). Patient declines due to being short staffed at work. Advised with the amount of pain she is having and bleeding needs to be seen for evaluation. Patient requesting to be seen tomorrow morning. Encouraged patient to be seen today but patient declines. Advised if symptoms worsen or do not get better will need to be seen at [facility] over night for evaluation. Patient is agreeable. Advised will need to speak with provider about best appointment time for tomorrow morning and return call. Patient is agreeable.

## 2014-09-20 NOTE — Telephone Encounter (Signed)
Pt is bleeding and cramping after having the biopsy on Thursday.

## 2014-09-20 NOTE — Telephone Encounter (Signed)
Spoke with patient. Patient states that she had light spotting Thursday after EMB then no bleeding until Saturday. Patient is still currently bleeding and having to change pad/tampon every hour and a half. "It is not bleeding through but a medium flow. I am having severe cramping." Patient states that she could not sleep last night due to pain and today cramping is an 8/10. Patient is at work and has taken tylenol with no relief. States that cramping is in the middle of her pelvic region. Advised patient will need to speak with provider in the office and return call with further recommendations and instructions. Patient requests return call to work as she will be there until 7pm.

## 2014-09-20 NOTE — Telephone Encounter (Signed)
Thank you for the update.  I will keep the encounter open so you can have contact with her tomorrow am.

## 2014-09-20 NOTE — Telephone Encounter (Signed)
Spoke with patient. Advised patient spoke with Nursing Supervisor Gay Filler. Patient will need to monitor bleeding and pain throughout the day and over night. If symptoms continue or worsen will need to be seen at [place] to take motrin for pain and give [place] a call first thing tomorrow morning with an update so that we can get patient scheduled to be seen for evaluation with MD. Patient is agreeable.

## 2014-09-21 LAB — IPS OTHER TISSUE BIOPSY

## 2014-09-21 NOTE — Telephone Encounter (Signed)
Left message to call  at [phone]. 

## 2014-09-21 NOTE — Telephone Encounter (Signed)
Spoke with patient. Advised patient of message as seen below from [place]. Patient is agreeable. Patient would like to come in to see Dr.Silva tomorrow morning. Appointment scheduled for tomorrow at [place] with Dr.Silva (time per provider). Patient is agreeable to date and time.   Routing to provider for final review. Patient agreeable to disposition. Will close encounter

## 2014-09-21 NOTE — Telephone Encounter (Signed)
Left message to call Kaitlyn at [phone]. 

## 2014-09-21 NOTE — Telephone Encounter (Signed)
Called to check on patient. Patient states "I am having light spotting that is barely anything. I am not cramping at all now. I am thinking it was from stopping the medication like Dr.Silva said." Patient would like me to check with Dr.Silva in regards to scheduling appointment for follow up. "I am barely bleeding now and I know we are waiting to get my results back." Advised would send a message to Dr.Silva and return call with further recommendations. Patient is agreeable.

## 2014-09-21 NOTE — Telephone Encounter (Signed)
Pt returning call

## 2014-09-21 NOTE — Telephone Encounter (Signed)
The EMB showed no cancer and no precancer.  I would like the patient to return for an office visit to discuss further care for her and a plan.  Stopping the Megace caused her to have heavy bleeding.

## 2014-09-22 ENCOUNTER — Ambulatory Visit (INDEPENDENT_AMBULATORY_CARE_PROVIDER_SITE_OTHER): Payer: BC Managed Care – PPO | Admitting: Obstetrics and Gynecology

## 2014-09-22 ENCOUNTER — Encounter: Payer: Self-pay | Admitting: Obstetrics and Gynecology

## 2014-09-22 VITALS — BP 122/80 | Resp 16 | Ht 63.75 in | Wt 243.0 lb

## 2014-09-22 DIAGNOSIS — R87610 Atypical squamous cells of undetermined significance on cytologic smear of cervix (ASC-US): Secondary | ICD-10-CM

## 2014-09-22 DIAGNOSIS — N924 Excessive bleeding in the premenopausal period: Secondary | ICD-10-CM

## 2014-09-22 MED ORDER — MEGESTROL ACETATE 40 MG PO TABS: ORAL_TABLET | ORAL | Status: DC

## 2014-09-22 NOTE — Progress Notes (Signed)
GYNECOLOGY  VISIT   HPI: 48 y.o.   Legally Separated  Caucasian  female   [phone] with Patient's last menstrual period was 02/17/2014.   here for a recheck of vaginal bleeding.  Had a withdrawal bleed from stopping Megace.  Bleeding was not severe.  Was saturating a pad every 2 hours.  Was having more cramping than anything.  Restarted Megace 40 mg po tid and bleeding now stopped.   Has fibroids by ultrasound - all intramural.   GYNECOLOGIC HISTORY: Patient's last menstrual period was 02/17/2014. Contraception:   Condoms. Menopausal hormone therapy: Megace.         OB History    Gravida Para Term Preterm AB TAB SAB Ectopic Multiple Living   4 3 0 0 1 0 0 0 0 3          Patient Active Problem List   Diagnosis Date Noted  . Excessive or frequent menstruation 04/08/2014  . Dysmenorrhea 04/08/2014  . H/O adenomatous polyp of colon 01/24/2012  . FH: colon cancer 01/24/2012  . Esophageal dysphagia 01/24/2012  . Chronic diarrhea 01/24/2012  . Depression 06/22/2011  . GERD (gastroesophageal reflux disease) 06/22/2011  . DM (diabetes mellitus) 06/22/2011  . Hypercholesterolemia 06/22/2011  . Hypertension 06/22/2011  . Obesity 06/22/2011  . 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 Depression 06/22/2011  . GERD (gastroesophageal reflux disease) 06/22/2011  . DM (diabetes mellitus) 06/22/2011  . Hypercholesterolemia 06/22/2011  . Hypertension 06/22/2011  . Obesity 06/22/2011  . Hx of adenomatous colonic polyps 10/2007    18mm sigmoid tubular adenoma, FH colon cancer, mother in mid-50s  . Anxiety   . Breast cancer 05/22/2011    03/01/11, Stage 2, s/p lumpectomy, chemo/xrt  . DDD (degenerative disc disease), lumbar   . 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 Vaginal bleeding 03/08/2014  . Shortness of breath   . Blood transfusion without reported diagnosis 1999    due to heavy menses  . Genital warts   . 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 Back surg x2    . Cholecystectomy    . Mm breast stereo bx*l*r/s      rt.  . Mastectomy partial / lumpectomy      rt.  . Colonoscopy  11/03/07    4-mm sessile polyp removed/small internal hemorrhoids/tubular adenoma, random colon bx negative for microscopic colitis  . Portacath placement    . Port-a-cath removal  05/26/2012    Procedure: REMOVAL PORT-A-CATH;  Surgeon: Jamesetta So, MD;  Location: [facility];  Service: General;  Laterality: N/A;  Minor Room    Current Outpatient Prescriptions  Medication Sig Dispense Refill  . albuterol (PROVENTIL HFA;VENTOLIN HFA) 108 (90 BASE) MCG/ACT inhaler Inhale 2 puffs into the lungs every 4 (four) hours as needed for wheezing. 1 Inhaler 5  . ALPRAZolam (XANAX) 0.5 MG tablet as needed.    Marland Kitchen aspirin EC 81 MG tablet Take 81 mg by mouth daily.    . citalopram (CELEXA) 40 MG tablet Take 1 tablet (40 mg total) by mouth daily. 90 tablet 1  . lisinopril-hydrochlorothiazide (PRINZIDE,ZESTORETIC) 10-12.5 MG per tablet Take 1 tablet by mouth daily. 90 tablet 1  . megestrol (MEGACE) 40 MG tablet Take 3 daily at one time 30 tablet 0  . metFORMIN (GLUCOPHAGE) 500 MG tablet Take 1 tablet (  500 mg total) by mouth 2 (two) times daily. 180 tablet 1  . pantoprazole (PROTONIX) 40 MG tablet Take 1 tablet (40 mg total) by mouth daily. For acid reflux 90 tablet 1  . rosuvastatin (CRESTOR) 10 MG tablet Take 1 tablet (10 mg total) by mouth at bedtime. 90 tablet 1  . Omega-3 Fatty Acids (FISH OIL) 1200 MG CAPS Take 1 capsule by mouth 2 (two) times daily.    . [DISCONTINUED] gabapentin (NEURONTIN) 300 MG capsule Take 1 capsule (300 mg total) by mouth 3 (three) times daily. 100 capsule 3  . [DISCONTINUED] potassium chloride (KCL) 2 mEq/mL SOLN oral liquid Take 10 mL BID for 21 days, then 10 mL daily, PO 300 mL 1   No current facility-administered medications for  this visit.     ALLERGIES: Review of patient's allergies indicates no known allergies.  Family History  Problem Relation Age of Onset  . Colon cancer Mother     mid-50s, died of metastatic disease  . Cancer Mother     liver  . Coronary artery disease Mother   . Diabetes type I Mother   . Coronary artery disease Father   . Diabetes Father   . Diabetes type I Father   . Hypertension Father   . Bipolar disorder Sister   . Coronary artery disease Brother   . Hypertension Brother   . Hypertension Maternal Grandmother   . Hyperlipidemia Maternal Grandmother   . Stroke Maternal Grandmother   . Hypertension Maternal Grandfather   . Diabetes Maternal Grandfather   . Diabetes Paternal Grandmother   . Heart disease Paternal Grandfather     History   Social History  . Marital Status: Legally Separated    Spouse Name: N/A    Number of Children: 3  . 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 Smoking status: Never Smoker   . Smokeless tobacco: Never Used  . Alcohol Use: No  . Drug Use: No  . Sexual Activity:    Partners: Male    Birth Control/ Protection: None   Other Topics Concern  . Not on file   Social History Narrative    ROS:  Pertinent items are noted in HPI.  PHYSICAL EXAMINATION:    BP 122/80 mmHg  Resp 16  Ht 5' 3.75" (1.619 m)  Wt 243 lb (110.224 kg)  BMI 42.05 kg/m2  LMP 02/17/2014     General appearance: alert, cooperative and appears stated age    ASSESSMENT  ASCUS pap and positive HR HPV.  Menorrhagia.  On Megace.  Restarted due to heavy withdrawal bleed.  Breast cancer, 2% progesterone receptor positive.    PLAN  Will schedule colposcopy.  I discussed potential Mirena vs. Dilation and curettage/hysteroscopy or robotic hysterectomy with BSO.  Handouts for each. Plan will partially depend on the colposcopy results.  Mirena would need to be approved by oncologist.      An After Visit  Summary was printed and given to the patient.  _15_____ minutes face to face time of which over 50% was spent in counseling.

## 2014-09-22 NOTE — Patient Instructions (Signed)
I will see you back for the colposcopy.  Please read your handouts on the dilation and curettage, the Mirena IUD, and the hysterectomy.

## 2014-09-22 NOTE — Addendum Note (Signed)
Addended by: Michele Mcalpine on: 09/22/2014 02:43 PM   Modules accepted: Orders

## 2014-09-28 ENCOUNTER — Telehealth: Payer: Self-pay | Admitting: Obstetrics and Gynecology

## 2014-09-28 NOTE — Telephone Encounter (Signed)
Left detailed message at number provided [phone]. Advised patient okay to keep appointment scheduled as planned. To call with any increased bleeding. Continue to take megace TID. Call with any further questions.  Routing to provider for final review. Patient agreeable to disposition. Will close encounter

## 2014-09-28 NOTE — Telephone Encounter (Signed)
Left message to call Esiah Bazinet at [phone]. 

## 2014-09-28 NOTE — Telephone Encounter (Signed)
Patient has a colpo scheduled for 10/01/14 but is having some bleeding and is "not sure what to do."

## 2014-09-28 NOTE — Telephone Encounter (Signed)
Pt returning call. Please call her on work number.

## 2014-09-28 NOTE — Telephone Encounter (Signed)
Spoke with patient. Patient states that she has noticed some "light spotting" with wiping after using the restroom. Patient is concerned about spotting with colposcopy appointment on 11/13. Advised patient as long as bleeding is light spotting we will be able to perform colpo. Advised if bleeding increases will need to return call so that we can make adjustments to appointment as it is important we are able to visualize the area for biopsy. Patient is agreeable and will monitor bleeding. If bleeding increases will return call. Patient is currently taking megace TID. Advised patient will send a message to Dr.Silva and if any further recommendations are needed or any adjustments need to be made to rx regimen will return call. Patient is agreeable.   Dr.Silva, anything further for this patient?

## 2014-09-28 NOTE — Telephone Encounter (Signed)
Continue with Megace.   I will see her for the scheduled colposcopy appointment.  Thanks.

## 2014-10-01 ENCOUNTER — Encounter: Payer: Self-pay | Admitting: Obstetrics and Gynecology

## 2014-10-01 ENCOUNTER — Ambulatory Visit (INDEPENDENT_AMBULATORY_CARE_PROVIDER_SITE_OTHER): Payer: BC Managed Care – PPO | Admitting: Obstetrics and Gynecology

## 2014-10-01 VITALS — BP 128/70 | HR 96 | Resp 16 | Ht 63.75 in | Wt 245.0 lb

## 2014-10-01 DIAGNOSIS — N926 Irregular menstruation, unspecified: Secondary | ICD-10-CM

## 2014-10-01 DIAGNOSIS — R87610 Atypical squamous cells of undetermined significance on cytologic smear of cervix (ASC-US): Secondary | ICD-10-CM

## 2014-10-01 LAB — POCT URINE PREGNANCY: Preg Test, Ur: NEGATIVE

## 2014-10-01 MED ORDER — METRONIDAZOLE 500 MG PO TABS: 500.0000 mg | ORAL_TABLET | Freq: Two times a day (BID) | ORAL | Status: DC

## 2014-10-01 NOTE — Patient Instructions (Addendum)
Colposcopy Care After Colposcopy is a procedure in which a special tool is used to magnify the surface of the cervix. A tissue sample (biopsy) may also be taken. This sample will be looked at for cervical cancer or other problems. After the test:  You may have some cramping.  Lie down for a few minutes if you feel lightheaded.   You may have some bleeding which should stop in a few days. HOME CARE  Do not have sex or use tampons for 2 to 3 days or as told.  Only take medicine as told by your doctor.  Continue to take your birth control pills as usual. Finding out the results of your test Ask when your test results will be ready. Make sure you get your test results. GET HELP RIGHT AWAY IF:  You are bleeding a lot or are passing blood clots.  You develop a fever of 102 F (38.9 C) or higher.  You have abnormal vaginal discharge.  You have cramps that do not go away with medicine.  You feel lightheaded, dizzy, or pass out (faint). MAKE SURE YOU:   Understand these instructions.  Will watch your condition.  Will get help right away if you are not doing well or get worse. Document Released: 04/23/2008 Document Revised: 01/28/2012 Document Reviewed: 06/04/2013 [facility] Patient Information 2015 [place], [place]. This information is not intended to replace advice given to you by your health care provider. Make sure you discuss any questions you have with your health care provider.  Take the Flagyl 500 mg by mouth twice a day for one week.  Do not mix the medication with alcohol of any kind.

## 2014-10-01 NOTE — Progress Notes (Signed)
Subjective:     Patient ID: [name], female   DOB: 1966/02/23, 48 y.o.   MRN: [id number]  HPI  48 y.o.   Legally Separated  Caucasian  female   [phone] with Patient's last menstrual period was 02/17/2014.   here for Colposcopy .  Pap shows ASCUS positive HR HPV.   2010 abnormal pap with colposcopy but no treatment to cervix. Repeat pap was normal. 1990's had colposcopy with LEEP procedure to cervix.  Has abnormal uterine bleeding.  EMB benign.  Ultrasound showing fibroids.  Has been back on Megace to control cycles so she can complete the colposcopy.  Having some bleeding today.   UPT negative today.      Review of Systems     Objective:   Physical Exam  Genitourinary:        Colposcopy Consent for procedure. Speculum placed.  Small amount of blood noted.  Vaginal odor appreciated.  3% acetic acid placed.  Endocervical speculum used.  Colposcopy satisfactory.  Biopsy of exocervix at 9 o'clock sent to pathology.  ECC to pathology.  Monsel's placed.    Minimal EBL.  No complications.   Assessment:     ASCUS pap and positive HR HPV.  Bacterial vaginosis on pap .  Odor on exam today.  Abnormal bleeding.  Fibroids. On Megace.  Negative EMB.    Plan:     Instructions given.  Follow up biopsy results.  Flagyl 500 mg po bid for 7 days.  ETOH precautions given.  Final plan for bleeding and abnormal pap to follow after colpo results are back.   After visit summary to patient.

## 2014-10-05 LAB — IPS OTHER TISSUE BIOPSY

## 2014-10-07 ENCOUNTER — Telehealth: Payer: Self-pay

## 2014-10-07 NOTE — Telephone Encounter (Signed)
Left message to call [place] at [phone].  Offer patient appointment on Dec 3rd at 8:15am with Dr.Silva (time per Gay Filler).

## 2014-10-07 NOTE — Telephone Encounter (Signed)
Spoke with patient. Advised patient of message as seen below from [place]. Patient is agreeable to schedule. Patient requesting early morning appointment with Dr.Silva. Advised patient will speak with provider regarding scheduling and will return call with appointment date and time options so that we can get her scheduled. Patient is agreeable.

## 2014-10-07 NOTE — Telephone Encounter (Signed)
-----   Message from [place], MD sent at 10/07/2014  6:33 AM EST ----- Please contact patient regarding her colposcopy results.  Biopsies showed low grade dysplasia. No cancer was seen. I have re-reviewed her ultrasound and the fibroids.  I would like for her to come in to the office after Thanksgiving holiday to discuss the plan for treatment of her bleeding.   Cc- Marisa Sprinkles

## 2014-10-11 NOTE — Telephone Encounter (Signed)
Returning call.

## 2014-10-11 NOTE — Telephone Encounter (Signed)
Spoke with patient. Appointment scheduled for December 3rd at 8:15am with Dr.Silva (time per Gay Filler). Patient is agreeable to date and time. Patient would like to know if she should continue to take Megace until then. Patient states that she is not having any current bleeding. "I am not having any bleeding because I have been taking the Megace." Patient ran out of Megace yesterday. Advised patient will need to check with covering provider regarding prescription recommendations and return call. Patient is agreeable.  Dr.Miller, should patient continue to take Megace until she is seen for follow up on December 3rd with Dr.Silva? See notes from colposcopy OV with Dr.Silva on 10/01/14 below. Results from colpo also seen below.  Pap shows ASCUS positive HR HPV.   2010 abnormal pap with colposcopy but no treatment to cervix. Repeat pap was normal. 1990's had colposcopy with LEEP procedure to cervix.  Has abnormal uterine bleeding.  EMB benign.  Ultrasound showing fibroids.  Has been back on Megace to control cycles so she can complete the colposcopy.  Having some bleeding today.

## 2014-10-12 MED ORDER — MEGESTROL ACETATE 40 MG PO TABS: ORAL_TABLET | ORAL | Status: DC

## 2014-10-12 NOTE — Telephone Encounter (Signed)
Spoke with patient. Advised patient of message as seen below from [place]. Patient is agreeable and verbalizes understanding. Refill for Megace 40mg  TID #[id number] sent to [facility] on file until patient's appointment on 12/3 with Dr.Silva.  Routing to provider for final review. Patient agreeable to disposition. Will close encounter

## 2014-10-12 NOTE — Telephone Encounter (Signed)
Stay on the Megace until appt.  OK to RF if needed.

## 2014-10-20 ENCOUNTER — Ambulatory Visit: Payer: BC Managed Care – PPO | Admitting: Obstetrics and Gynecology

## 2014-10-20 ENCOUNTER — Telehealth: Payer: Self-pay | Admitting: Obstetrics and Gynecology

## 2014-10-20 NOTE — Telephone Encounter (Signed)
Spoke with patient. Patient states that she just found out the insurance she has through her company is going to be switched over to [employer]. New insurance coverage does not start until January 1st. "Right now I do not have insurance and I had no idea. I just can't afford to pay for the visit. I really want to come in in January to see Dr.Silva though. Your practice is so nice and you have really helped me with my problem. Please tell her I am sorry." Advised patient would let Dr.Silva know. "Is there something I could do to control the bleeding in the mean time like continue taking the Aygestin and then reschedule for January?" Advised patient would send a message over to Dr.Silva and return call with further recommendations. Patient is agreeable.  Dr.Silva, would you like patient's cancellation fee for tomorrow waived?

## 2014-10-20 NOTE — Telephone Encounter (Signed)
Patient calling with questions about her appointment tomorrow re: consult for steps after colpo. She is not sure she can keep it due to insurance reasons but doesn't;t want to cancel until after speaking with the nurse.

## 2014-10-21 ENCOUNTER — Ambulatory Visit: Payer: BC Managed Care – PPO | Admitting: Obstetrics and Gynecology

## 2014-10-21 NOTE — Telephone Encounter (Signed)
Patient was given refill on Megace by Dr. Sabra Heck just before thanksgiving.  I will need to see the patient for a consult just after New Years. The Megace is not going to be a long term solution for her.   Ok to waive cancellation fee for her visit.

## 2014-10-21 NOTE — Telephone Encounter (Signed)
Left message to call Jonnie Truxillo at [phone]. 

## 2014-10-22 NOTE — Telephone Encounter (Signed)
Spoke with patient. Advised patient of message as seen below from [place]. Patient is agreeable and verbalizes understanding. Patient would like to all and schedule appointment for the beginning of January when it gets closer so that she can plan with her work schedule.  CC: [name] to waive fee  Routing to provider for final review. Patient agreeable to disposition. Will close encounter

## 2014-11-06 ENCOUNTER — Other Ambulatory Visit: Payer: Self-pay | Admitting: Nurse Practitioner

## 2014-11-15 ENCOUNTER — Other Ambulatory Visit: Payer: Self-pay | Admitting: Obstetrics and Gynecology

## 2014-11-15 NOTE — Telephone Encounter (Signed)
Pt  Is requesting a refill for Megace to be sent to [facility] in [place]. Pt states she is completely out.

## 2014-11-15 NOTE — Telephone Encounter (Signed)
Medication refill request: Megace 40 mg  Last AEX:  09/08/14 with Dr. Quincy Simmonds Next AEX: no AEX scheduled for 2016 Last MMG (if hormonal medication request): N/A  Refill authorized: #90/? Refills please advise.

## 2014-11-18 MED ORDER — MEGESTROL ACETATE 40 MG PO TABS: ORAL_TABLET | ORAL | Status: DC

## 2014-11-18 NOTE — Telephone Encounter (Signed)
Please contact patient to schedule an appointment.   Patient needs to have a follow up with me in January.   I will refill only one month of the Megace.

## 2014-11-22 ENCOUNTER — Telehealth: Payer: Self-pay | Admitting: *Deleted

## 2014-11-22 NOTE — Telephone Encounter (Signed)
Please see refill request message from Dr. Quincy Simmonds. 1 month refill has been called in but patient needs follow-up appointment with Dr. Quincy Simmonds. Call to patient to notify appointment needed. Left message to call back.

## 2014-11-22 NOTE — Telephone Encounter (Signed)
LM for pt to call back and schedule follow up appt.

## 2014-11-22 NOTE — Telephone Encounter (Signed)
See next telephone encounter 

## 2014-11-29 NOTE — Telephone Encounter (Signed)
Follow up call to patient. Left message to call back. Can speak with anyone of the clinical staff. Needs follow-up appointment with Dr Quincy Simmonds.

## 2014-12-15 NOTE — Telephone Encounter (Signed)
Call to patient cell number, rings and then disconnects before able to leave message. Call to work number. Patient states cell phone has been giving her trouble. Advised that we received refill request several weeks ago and have been trying to reach her to advise that appointment needed with Dr Quincy Simmonds before refills. Patient declines to schedule. States she is busy with her daughter and does not have time to come to [place]. She lives in [place]. She will call back next week to schedule. Advised it will take time to get appointment with Dr Quincy Simmonds and recommend she call to schedule ASAP. Patient states she still has some medication and she will call next week. Again advised it takes time to get appointment with Dr Quincy Simmonds and needs follow-up before refills.  Routing to provider for final review. Patient agreeable to disposition. Will close encounter  Routing to Dr Sabra Heck in Dr Elza Rafter absence.

## 2014-12-16 ENCOUNTER — Telehealth: Payer: Self-pay | Admitting: Nurse Practitioner

## 2014-12-16 MED ORDER — LISINOPRIL-HYDROCHLOROTHIAZIDE 10-12.5 MG PO TABS: 1.0000 | ORAL_TABLET | Freq: Every day | ORAL | Status: DC

## 2014-12-16 MED ORDER — PANTOPRAZOLE SODIUM 40 MG PO TBEC: 40.0000 mg | DELAYED_RELEASE_TABLET | Freq: Every day | ORAL | Status: DC

## 2014-12-16 NOTE — Telephone Encounter (Signed)
Left message on machine that refill was sent to pharmacy.

## 2014-12-16 NOTE — Telephone Encounter (Signed)
Pt needs refill on her Lisinopril and Pantoprazole   Send to [facility]

## 2015-01-06 ENCOUNTER — Telehealth: Payer: Self-pay | Admitting: *Deleted

## 2015-01-06 ENCOUNTER — Encounter: Payer: Self-pay | Admitting: *Deleted

## 2015-01-06 NOTE — Telephone Encounter (Signed)
Letter mailed to patient regarding Dr Elza Rafter recommendation for follow-up and referral to [facility]. Letter sent certified and regular [place] mail.  Routing to provider. Encounter closed.

## 2015-01-18 ENCOUNTER — Ambulatory Visit: Payer: 59 | Admitting: *Deleted

## 2015-01-18 ENCOUNTER — Telehealth: Payer: Self-pay | Admitting: Obstetrics and Gynecology

## 2015-01-18 ENCOUNTER — Other Ambulatory Visit (HOSPITAL_COMMUNITY): Payer: Self-pay | Admitting: Oncology

## 2015-01-18 DIAGNOSIS — Z9889 Other specified postprocedural states: Secondary | ICD-10-CM

## 2015-01-18 NOTE — Telephone Encounter (Signed)
Pt returning call. Call work number [phone].

## 2015-01-18 NOTE — Progress Notes (Deleted)
   Subjective:    Patient ID: [name], female    DOB: 10-05-1966, 49 y.o.   MRN: [id number]  HPI Pt walked in for a Tdap. Was told by the hospital that she could not come see her grandchild w/o having a Tdap, per patient.  I notified the patient that the Tdap may not be covered by her insurance.  Pt verbalized understanding.    Review of Systems     Objective:   Physical Exam        Assessment & Plan:

## 2015-01-18 NOTE — Telephone Encounter (Signed)
Left message to call Kaitlyn at [phone]. 

## 2015-01-18 NOTE — Telephone Encounter (Signed)
Patient wants to schedule an appointment with Dr. Quincy Simmonds for "what to do after having a colposcopy?"

## 2015-01-18 NOTE — Telephone Encounter (Signed)
Attempted to reach patient at work number [phone]. There was no answer and voicemail not left as it is not private. Will try again later.

## 2015-01-18 NOTE — Progress Notes (Signed)
Pt declined vaccine. Will got to [facility] or [facility] D[facility].

## 2015-01-18 NOTE — Telephone Encounter (Signed)
Spoke with patient. Patient states that she would like to schedule a follow up appointment with Dr.Silva to discuss what she recommends after her colposcopy. Offered patient appointment on 3/7 at 11:30am but patient declines. 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 I need to go and get back as soon as I can." Advised will need to speak with Dr.Silva regarding scheduling appointment date and time and return call with further recommendations. Patient is agreeable.

## 2015-01-19 NOTE — Telephone Encounter (Signed)
Spoke with patient. Appointment scheduled for 3/24 at 8am with Dr.Silva. Patient is agreeable to date and time.  Routing to provider for final review. Patient agreeable to disposition. Will close encounter

## 2015-01-28 ENCOUNTER — Ambulatory Visit (INDEPENDENT_AMBULATORY_CARE_PROVIDER_SITE_OTHER): Payer: 59 | Admitting: Family Medicine

## 2015-01-28 ENCOUNTER — Encounter: Payer: Self-pay | Admitting: Family Medicine

## 2015-01-28 VITALS — BP 110/72 | Ht 64.0 in | Wt 247.0 lb

## 2015-01-28 DIAGNOSIS — E785 Hyperlipidemia, unspecified: Secondary | ICD-10-CM

## 2015-01-28 DIAGNOSIS — Z79899 Other long term (current) drug therapy: Secondary | ICD-10-CM | POA: Diagnosis not present

## 2015-01-28 DIAGNOSIS — E7800 Pure hypercholesterolemia, unspecified: Secondary | ICD-10-CM

## 2015-01-28 DIAGNOSIS — I10 Essential (primary) hypertension: Secondary | ICD-10-CM

## 2015-01-28 DIAGNOSIS — E119 Type 2 diabetes mellitus without complications: Secondary | ICD-10-CM

## 2015-01-28 LAB — POCT GLYCOSYLATED HEMOGLOBIN (HGB A1C): Hemoglobin A1C: 7

## 2015-01-28 MED ORDER — PRAVASTATIN SODIUM 40 MG PO TABS: 40.0000 mg | ORAL_TABLET | Freq: Every day | ORAL | Status: DC

## 2015-01-28 MED ORDER — AMOXICILLIN 500 MG PO CAPS: 500.0000 mg | ORAL_CAPSULE | Freq: Three times a day (TID) | ORAL | Status: DC

## 2015-01-28 MED ORDER — METFORMIN HCL 500 MG PO TABS: 500.0000 mg | ORAL_TABLET | Freq: Two times a day (BID) | ORAL | Status: DC

## 2015-01-28 MED ORDER — PANTOPRAZOLE SODIUM 40 MG PO TBEC
40.0000 mg | DELAYED_RELEASE_TABLET | Freq: Every day | ORAL | Status: DC
Start: 2015-01-28 — End: 2015-10-31

## 2015-01-28 MED ORDER — CITALOPRAM HYDROBROMIDE 40 MG PO TABS: 40.0000 mg | ORAL_TABLET | Freq: Every day | ORAL | Status: DC

## 2015-01-28 MED ORDER — LISINOPRIL-HYDROCHLOROTHIAZIDE 10-12.5 MG PO TABS: 1.0000 | ORAL_TABLET | Freq: Every day | ORAL | Status: DC

## 2015-01-28 NOTE — Progress Notes (Signed)
   Subjective:    Patient ID: [name], female    DOB: 1966/02/23, 49 y.o.   MRN: [id number]  Diabetes She presents for her follow-up diabetic visit. She has type 2 diabetes mellitus. Current diabetic treatment includes oral agent (monotherapy). She is compliant with treatment all of the time. Exercise: on weekends. Her breakfast blood glucose range is generally 110-130 mg/dl. She does not see a podiatrist.Eye exam is not current.   Taking protonix but still having reflux.   Sinus congestion, cough. Started this week.   Needs refills on all meds.  Results for orders placed or performed in visit on 01/28/15  POCT glycosylated hemoglobin (Hb A1C)  Result Value Ref Range   Hemoglobin A1C 7.0   morn numbers 120 or so,  Patient claims compliance with lipid medication. Notes very costly. Trying to watch fat in her diet. Review of Systems Some frontal headache diminished energy low-grade fever no chest pain no back pain no abdominal pain no change in bowel habits    Objective:   Physical Exam  Alert no acute distress. Vitals stable HET moderate his congestion frontal tenderness. Lungs clear. Heart regular in rhythm ankles without edema      Assessment & Plan:  Impression 1 type 2 diabetes control decent #2 hyperlipidemia status uncertain. #3 hypertension good control #4 rhinosinusitis plan appropriate blood work. Diet and exercise discussed in encourage. Antibiotics prescribed. Symptomatic care discussed. WSL

## 2015-01-29 LAB — HEPATIC FUNCTION PANEL
ALT: 16 IU/L (ref 0–32)
AST: 14 IU/L (ref 0–40)
Albumin: 4.3 g/dL (ref 3.5–5.5)
Alkaline Phosphatase: 43 IU/L (ref 39–117)
Bilirubin Total: 0.4 mg/dL (ref 0.0–1.2)
Bilirubin, Direct: 0.09 mg/dL (ref 0.00–0.40)
TOTAL PROTEIN: 7.3 g/dL (ref 6.0–8.5)

## 2015-01-29 LAB — LIPID PANEL
Chol/HDL Ratio: 4.8 ratio units — ABNORMAL HIGH (ref 0.0–4.4)
Cholesterol, Total: 164 mg/dL (ref 100–199)
HDL: 34 mg/dL — AB (ref >39)
LDL CALC: 94 mg/dL (ref 0–99)
Triglycerides: 181 mg/dL — ABNORMAL HIGH (ref 0–149)
VLDL Cholesterol Cal: 36 mg/dL (ref 5–40)

## 2015-01-30 ENCOUNTER — Encounter: Payer: Self-pay | Admitting: Family Medicine

## 2015-02-01 ENCOUNTER — Encounter (HOSPITAL_COMMUNITY): Payer: PRIVATE HEALTH INSURANCE

## 2015-02-07 ENCOUNTER — Encounter (HOSPITAL_COMMUNITY): Payer: Self-pay | Admitting: *Deleted

## 2015-02-07 ENCOUNTER — Telehealth: Payer: Self-pay | Admitting: Obstetrics and Gynecology

## 2015-02-07 ENCOUNTER — Inpatient Hospital Stay (HOSPITAL_COMMUNITY)
Admission: AD | Admit: 2015-02-07 | Discharge: 2015-02-07 | Disposition: A | Discharge status: Home / Self Care | Payer: 59 | Source: Ambulatory Visit | Attending: Obstetrics and Gynecology | Admitting: Obstetrics and Gynecology

## 2015-02-07 DIAGNOSIS — N939 Abnormal uterine and vaginal bleeding, unspecified: Secondary | ICD-10-CM | POA: Diagnosis not present

## 2015-02-07 DIAGNOSIS — N852 Hypertrophy of uterus: Secondary | ICD-10-CM | POA: Diagnosis not present

## 2015-02-07 DIAGNOSIS — D259 Leiomyoma of uterus, unspecified: Secondary | ICD-10-CM | POA: Diagnosis not present

## 2015-02-07 DIAGNOSIS — R109 Unspecified abdominal pain: Secondary | ICD-10-CM

## 2015-02-07 LAB — CBC
HCT: 40.5 % (ref 36.0–46.0)
Hemoglobin: 13.3 g/dL (ref 12.0–15.0)
MCH: 29.1 pg (ref 26.0–34.0)
MCHC: 32.8 g/dL (ref 30.0–36.0)
MCV: 88.6 fL (ref 78.0–100.0)
Platelets: 249 10*3/uL (ref 150–400)
RBC: 4.57 MIL/uL (ref 3.87–5.11)
RDW: 14.9 % (ref 11.5–15.5)
WBC: 9.7 10*3/uL (ref 4.0–10.5)

## 2015-02-07 LAB — URINALYSIS, ROUTINE W REFLEX MICROSCOPIC
Bilirubin Urine: NEGATIVE
Glucose, UA: NEGATIVE mg/dL
Ketones, ur: NEGATIVE mg/dL
Leukocytes, UA: NEGATIVE
Nitrite: NEGATIVE
PROTEIN: NEGATIVE mg/dL
Specific Gravity, Urine: >1.03 — ABNORMAL HIGH (ref 1.005–1.030)
Urobilinogen, UA: 0.2 mg/dL (ref 0.0–1.0)
pH: 5.5 (ref 5.0–8.0)

## 2015-02-07 LAB — POCT PREGNANCY, URINE: Preg Test, Ur: NEGATIVE

## 2015-02-07 LAB — URINE MICROSCOPIC-ADD ON

## 2015-02-07 MED ORDER — OXYCODONE-ACETAMINOPHEN 5-325 MG PO TABS
1.0000 | ORAL_TABLET | Freq: Once | ORAL | Status: AC
  Administered 2015-02-07: 2 via ORAL

## 2015-02-07 MED ORDER — OXYCODONE-ACETAMINOPHEN 2.5-325 MG PO TABS
2.0000 | ORAL_TABLET | ORAL | Status: DC | PRN
Start: 2015-02-07 — End: 2015-02-25

## 2015-02-07 MED ORDER — OXYCODONE-ACETAMINOPHEN 5-325 MG PO TABS
ORAL_TABLET | ORAL | Status: AC
  Filled 2015-02-07: qty 2

## 2015-02-07 NOTE — Telephone Encounter (Signed)
I have received this note that the patient is going to MAU.  She has been previously advised to follow up for her bleeding as she has been chronically taking Megace to control menorrhagia.  I have recommended that she see GYN [facility] for a second opinion due to her ongoing bleeding.   Cc- Melvia Heaps

## 2015-02-07 NOTE — Telephone Encounter (Signed)
Spoke with patient. Patient states that last Thursday 3/17 she began to have spotting that increased yesterday. Patient has been changing her pad every hour due to bleeding through. "Last night it was so bad that I had to wear two pads. I am in so much pain that I thought I was going to have to go to the hospital." Pain is sharp shooting pain that is midline but more severe on patient's left side. Last night pain was 10/10 and today is 8/10. Has taken ibuprofen with no relief. Patient is changing pad every hour today since waking up. "I am having a lot of clotting." Is feeling weak and fatigue. Patient is scheduled for appointment on 3/24 at 8am with Dr.Silva for follow up to discuss how to proceed after her colposcopy. Advised patient will need to be seen sooner for evaluation. Patient is agreeable. Advised will check regarding time and return call for scheduling. Patient is agreeable.

## 2015-02-07 NOTE — Telephone Encounter (Signed)
Patient seen in MAU at [facility] by midlevel provider and had old blood per vagina and enlarged uterus.  Hgb normal and negative UPT.  Treated with Percocet for pain.  Will follow up this week with me for plan for bleeding from fibroids and cervical dysplasia.  Encounter closed.   Cc - [name].

## 2015-02-07 NOTE — Telephone Encounter (Signed)
Thank you for letting me know

## 2015-02-07 NOTE — MAU Provider Note (Signed)
History   49 yo wf in with heavy vag bleeding and and abd pain since last Thursday. Has hx of uterine fibroids and is being followed by Dr. Quincy Simmonds. She has appointment with Sr. Quincy Simmonds this Thursday. States was taking megace but ran out. Has not slept in several nights from the cramping. Has been changing 6-7 pads a day and sometimes has to double the pads.  CSN: [id number]  Arrival date and time: 02/07/15 1407   First Provider Initiated Contact with Patient 02/07/15 1645      Chief Complaint  Patient presents with  . Vaginal Bleeding  . Abdominal Pain   Vaginal Bleeding The patient's primary symptoms include vaginal bleeding. This is a recurrent problem. The current episode started in the past 7 days. The problem occurs constantly. The problem has been unchanged. The pain is moderate. The problem affects both sides. She is not pregnant. Associated symptoms include abdominal pain.  Abdominal Pain    OB History    Gravida Para Term Preterm AB TAB SAB Ectopic Multiple Living   4 3 0 0 1 0 0 0 0 3       Past Medical History  Diagnosis Date  . Depression 06/22/2011  . GERD (gastroesophageal reflux disease) 06/22/2011  . DM (diabetes mellitus) 06/22/2011  . Hypercholesterolemia 06/22/2011  . Hypertension 06/22/2011  . Obesity 06/22/2011  . Hx of adenomatous colonic polyps 10/2007    76mm sigmoid tubular adenoma, FH colon cancer, mother in mid-50s  . Anxiety   . Breast cancer 05/22/2011    03/01/11, Stage 2, s/p lumpectomy, chemo/xrt  . DDD (degenerative disc disease), lumbar   . 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 Vaginal bleeding 03/08/2014  . Shortness of breath   . Blood transfusion without reported diagnosis 1999    due to heavy menses  . Genital warts   . 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 Back surg x2    . Cholecystectomy    . Mm breast stereo bx*l*r/s      rt.  .  Mastectomy partial / lumpectomy      rt.  . Colonoscopy  11/03/07    4-mm sessile polyp removed/small internal hemorrhoids/tubular adenoma, random colon bx negative for microscopic colitis  . Portacath placement    . Port-a-cath removal  05/26/2012    Procedure: REMOVAL PORT-A-CATH;  Surgeon: Jamesetta So, MD;  Location: [facility];  Service: General;  Laterality: N/A;  Minor Room    Family History  Problem Relation Age of Onset  . Colon cancer Mother     mid-50s, died of metastatic disease  . Cancer Mother     liver  . Coronary artery disease Mother   . Diabetes type I Mother   . Coronary artery disease Father   . Diabetes Father   . Diabetes type I Father   . Hypertension Father   . Bipolar disorder Sister   . Coronary artery disease Brother   . Hypertension Brother   . Hypertension Maternal Grandmother   . Hyperlipidemia Maternal Grandmother   . Stroke Maternal Grandmother   . Hypertension Maternal Grandfather   . Diabetes Maternal Grandfather   . Diabetes Paternal Grandmother   . Heart disease Paternal Grandfather     History  Substance Use Topics  . Smoking status: Never Smoker   . Smokeless tobacco: Never Used  . Alcohol Use: No  Allergies: No Known Allergies  Prescriptions prior to admission  Medication Sig Dispense Refill Last Dose  . albuterol (PROVENTIL HFA;VENTOLIN HFA) 108 (90 BASE) MCG/ACT inhaler Inhale 2 puffs into the lungs every 4 (four) hours as needed for wheezing. 1 Inhaler 5 Taking  . ALPRAZolam (XANAX) 0.5 MG tablet as needed.   Taking  . amoxicillin (AMOXIL) 500 MG capsule Take 1 capsule (500 mg total) by mouth 3 (three) times daily. 30 capsule 0   . aspirin EC 81 MG tablet Take 81 mg by mouth daily.   Taking  . citalopram (CELEXA) 40 MG tablet Take 1 tablet (40 mg total) by mouth daily. 90 tablet 1   . lisinopril-hydrochlorothiazide (PRINZIDE,ZESTORETIC) 10-12.5 MG per tablet Take 1 tablet by mouth daily. 90 tablet 1   . metFORMIN (GLUCOPHAGE)  500 MG tablet Take 1 tablet (500 mg total) by mouth 2 (two) times daily. 180 tablet 1   . Omega-3 Fatty Acids (FISH OIL) 1200 MG CAPS Take 1 capsule by mouth 2 (two) times daily.   Taking  . pantoprazole (PROTONIX) 40 MG tablet Take 1 tablet (40 mg total) by mouth daily. For acid reflux 90 tablet 1   . pravastatin (PRAVACHOL) 40 MG tablet Take 1 tablet (40 mg total) by mouth daily. 90 tablet 1     Review of Systems  Unable to perform ROS Constitutional: Negative.   HENT: Negative.   Eyes: Negative.   Respiratory: Negative.   Cardiovascular: Negative.   Gastrointestinal: Positive for abdominal pain.  Genitourinary: Negative.   Musculoskeletal: Negative.   Skin: Negative.   Neurological: Negative.   Endo/Heme/Allergies: Negative.   Psychiatric/Behavioral: Negative.    Physical Exam   Blood pressure 100/71, pulse 117, temperature 99 F (37.2 C), temperature source Oral, resp. rate 18, last menstrual period 02/03/2015.  Physical Exam  Constitutional: She is oriented to person, place, and time. She appears well-developed and well-nourished.  HENT:  Head: Normocephalic.  Eyes: Pupils are equal, round, and reactive to light.  Neck: Normal range of motion.  Cardiovascular: Normal rate, regular rhythm, normal heart sounds and intact distal pulses.   Respiratory: Effort normal and breath sounds normal.  GI: Soft. Bowel sounds are normal.  Genitourinary: Vagina normal.  Uterus 18-20 wk size  Musculoskeletal: Normal range of motion.  Neurological: She is alert and oriented to person, place, and time. She has normal reflexes.  Skin: Skin is warm and dry.  Psychiatric: She has a normal mood and affect. Her behavior is normal. Judgment and thought content normal.    MAU Course  Procedures  MDM DUB, Uterine fibroids  Assessment and Plan  Enlarged uterus to 18-20 wk size, spec exam shows sm amt dark vag bleeding at present time. Will consult Dr. Quincy Simmonds. Manage pain. Discussed care  with Dr. Quincy Simmonds she does not want megace restarted 2ndary to hx of breast ca for pt. Will give Rx to manage cramping.  Koren Shiver DARLENE 02/07/2015, 4:50 PM

## 2015-02-07 NOTE — MAU Note (Signed)
Pt dx'd with fibroids in November, is on megace.  Has not had the megace for the last week because of insurance issues.  Started bleeding last Thursday, has gradually increased.  Wearing two pads at a time last night..  Had severe lower abd pain last night, not quite as bad today.

## 2015-02-07 NOTE — MAU Note (Signed)
Urine in lab 

## 2015-02-07 NOTE — Telephone Encounter (Signed)
Spoke with patient. Advised patient Dr.Silva will be out of the office the remainder of the day. Reviewed with Regina Eck CNM who recommends patient be evaluated at [facility] MAU. Patient is agreeable and will head to MAU at this time. "Should I keep my appointment for Thursday?" Advised patient will need to seek evaluation at MAU which will be reviewed by our office and further recommendations regarding appointment will be made at that time. Patient is agreeable.  Routing to Regina Eck CNM as covering.  Cc: Dr.Silva

## 2015-02-07 NOTE — Telephone Encounter (Signed)
Pt states she has an appointment this Thursday for fibroids. states she's been experiencing pain and heavy bleeding. Pt concerned. Pt goes by [employer]. Pt asks for call at work [phone]  No paper chart

## 2015-02-08 NOTE — Telephone Encounter (Signed)
Patient has OV scheduled on 02-10-15 with Dr Quincy Simmonds.

## 2015-02-10 ENCOUNTER — Encounter: Payer: Self-pay | Admitting: Obstetrics and Gynecology

## 2015-02-10 ENCOUNTER — Inpatient Hospital Stay (HOSPITAL_COMMUNITY): Payer: 59 | Admitting: Anesthesiology

## 2015-02-10 ENCOUNTER — Encounter (HOSPITAL_COMMUNITY)
Admission: AD | Disposition: A | Discharge status: Home / Self Care | Payer: Self-pay | Source: Ambulatory Visit | Attending: Obstetrics and Gynecology

## 2015-02-10 ENCOUNTER — Ambulatory Visit: Admission: RE | Admit: 2015-02-10 | Payer: 59 | Source: Ambulatory Visit | Admitting: Obstetrics and Gynecology

## 2015-02-10 ENCOUNTER — Encounter (HOSPITAL_COMMUNITY): Payer: Self-pay | Admitting: *Deleted

## 2015-02-10 ENCOUNTER — Ambulatory Visit (HOSPITAL_COMMUNITY)
Admission: AD | Admit: 2015-02-10 | Discharge: 2015-02-10 | Disposition: A | Discharge status: Home / Self Care | Payer: 59 | Source: Ambulatory Visit | Attending: Obstetrics and Gynecology | Admitting: Obstetrics and Gynecology

## 2015-02-10 ENCOUNTER — Ambulatory Visit (INDEPENDENT_AMBULATORY_CARE_PROVIDER_SITE_OTHER): Payer: 59 | Admitting: Obstetrics and Gynecology

## 2015-02-10 VITALS — BP 120/82 | HR 80 | Ht 63.75 in | Wt 248.4 lb

## 2015-02-10 DIAGNOSIS — M1990 Unspecified osteoarthritis, unspecified site: Secondary | ICD-10-CM | POA: Insufficient documentation

## 2015-02-10 DIAGNOSIS — Z6841 Body Mass Index (BMI) 40.0 and over, adult: Secondary | ICD-10-CM | POA: Diagnosis not present

## 2015-02-10 DIAGNOSIS — K219 Gastro-esophageal reflux disease without esophagitis: Secondary | ICD-10-CM | POA: Diagnosis not present

## 2015-02-10 DIAGNOSIS — F419 Anxiety disorder, unspecified: Secondary | ICD-10-CM | POA: Insufficient documentation

## 2015-02-10 DIAGNOSIS — E669 Obesity, unspecified: Secondary | ICD-10-CM | POA: Diagnosis not present

## 2015-02-10 DIAGNOSIS — Z853 Personal history of malignant neoplasm of breast: Secondary | ICD-10-CM | POA: Diagnosis not present

## 2015-02-10 DIAGNOSIS — Z9049 Acquired absence of other specified parts of digestive tract: Secondary | ICD-10-CM | POA: Diagnosis not present

## 2015-02-10 DIAGNOSIS — M5136 Other intervertebral disc degeneration, lumbar region: Secondary | ICD-10-CM | POA: Diagnosis not present

## 2015-02-10 DIAGNOSIS — N921 Excessive and frequent menstruation with irregular cycle: Secondary | ICD-10-CM

## 2015-02-10 DIAGNOSIS — F329 Major depressive disorder, single episode, unspecified: Secondary | ICD-10-CM | POA: Diagnosis not present

## 2015-02-10 DIAGNOSIS — N920 Excessive and frequent menstruation with regular cycle: Secondary | ICD-10-CM | POA: Diagnosis present

## 2015-02-10 DIAGNOSIS — E780 Pure hypercholesterolemia: Secondary | ICD-10-CM | POA: Insufficient documentation

## 2015-02-10 DIAGNOSIS — Z800 Family history of malignant neoplasm of digestive organs: Secondary | ICD-10-CM | POA: Insufficient documentation

## 2015-02-10 DIAGNOSIS — I10 Essential (primary) hypertension: Secondary | ICD-10-CM | POA: Insufficient documentation

## 2015-02-10 DIAGNOSIS — D259 Leiomyoma of uterus, unspecified: Secondary | ICD-10-CM | POA: Insufficient documentation

## 2015-02-10 DIAGNOSIS — Z9011 Acquired absence of right breast and nipple: Secondary | ICD-10-CM | POA: Insufficient documentation

## 2015-02-10 DIAGNOSIS — E119 Type 2 diabetes mellitus without complications: Secondary | ICD-10-CM | POA: Diagnosis not present

## 2015-02-10 HISTORY — PX: DILATATION & CURETTAGE/HYSTEROSCOPY WITH MYOSURE: SHX6511

## 2015-02-10 LAB — COMPREHENSIVE METABOLIC PANEL
ALT: 17 U/L (ref 0–35)
AST: 19 U/L (ref 0–37)
Albumin: 3.8 g/dL (ref 3.5–5.2)
Alkaline Phosphatase: 38 U/L — ABNORMAL LOW (ref 39–117)
Anion gap: 8 (ref 5–15)
BUN: 16 mg/dL (ref 6–23)
CO2: 25 mmol/L (ref 19–32)
Calcium: 8.6 mg/dL (ref 8.4–10.5)
Chloride: 104 mmol/L (ref 96–112)
Creatinine, Ser: 0.86 mg/dL (ref 0.50–1.10)
GFR calc Af Amer: >90 mL/min (ref >90)
GFR calc non Af Amer: 78 mL/min — ABNORMAL LOW (ref >90)
Glucose, Bld: 118 mg/dL — ABNORMAL HIGH (ref 70–99)
Potassium: 3.8 mmol/L (ref 3.5–5.1)
Sodium: 137 mmol/L (ref 135–145)
Total Bilirubin: 0.2 mg/dL — ABNORMAL LOW (ref 0.3–1.2)
Total Protein: 6.9 g/dL (ref 6.0–8.3)

## 2015-02-10 LAB — GLUCOSE, CAPILLARY
Glucose-Capillary: 110 mg/dL — ABNORMAL HIGH (ref 70–99)
Glucose-Capillary: 114 mg/dL — ABNORMAL HIGH (ref 70–99)

## 2015-02-10 LAB — CBC
HCT: 34.2 % — ABNORMAL LOW (ref 36.0–46.0)
Hemoglobin: 11.3 g/dL — ABNORMAL LOW (ref 12.0–15.0)
MCH: 29.4 pg (ref 26.0–34.0)
MCHC: 33 g/dL (ref 30.0–36.0)
MCV: 89.1 fL (ref 78.0–100.0)
Platelets: 208 10*3/uL (ref 150–400)
RBC: 3.84 MIL/uL — ABNORMAL LOW (ref 3.87–5.11)
RDW: 14.9 % (ref 11.5–15.5)
WBC: 7.1 10*3/uL (ref 4.0–10.5)

## 2015-02-10 LAB — TYPE AND SCREEN
ABO/RH(D): O POS
Antibody Screen: NEGATIVE

## 2015-02-10 LAB — ABO/RH: ABO/RH(D): O POS

## 2015-02-10 SURGERY — DILATATION & CURETTAGE/HYSTEROSCOPY WITH MYOSURE
Anesthesia: General | Site: Uterus

## 2015-02-10 MED ORDER — LACTATED RINGERS IV SOLN
INTRAVENOUS | Status: DC
  Administered 2015-02-10: 12:00:00 via INTRAVENOUS

## 2015-02-10 MED ORDER — IBUPROFEN 800 MG PO TABS: 800.0000 mg | ORAL_TABLET | Freq: Three times a day (TID) | ORAL | Status: DC | PRN

## 2015-02-10 MED ORDER — FENTANYL CITRATE 0.05 MG/ML IJ SOLN
INTRAMUSCULAR | Status: AC
  Administered 2015-02-10: 50 ug via INTRAVENOUS
  Filled 2015-02-10: qty 2

## 2015-02-10 MED ORDER — SUCCINYLCHOLINE CHLORIDE 20 MG/ML IJ SOLN
INTRAMUSCULAR | Status: DC | PRN
  Administered 2015-02-10: 100 mg via INTRAVENOUS

## 2015-02-10 MED ORDER — PROPOFOL 10 MG/ML IV BOLUS
INTRAVENOUS | Status: AC
  Filled 2015-02-10: qty 40

## 2015-02-10 MED ORDER — FAMOTIDINE IN NACL 20-0.9 MG/50ML-% IV SOLN
20.0000 mg | Freq: Once | INTRAVENOUS | Status: AC
  Administered 2015-02-10: 20 mg via INTRAVENOUS
  Filled 2015-02-10: qty 50

## 2015-02-10 MED ORDER — FENTANYL CITRATE 0.05 MG/ML IJ SOLN
INTRAMUSCULAR | Status: DC | PRN
  Administered 2015-02-10: 100 ug via INTRAVENOUS
  Administered 2015-02-10: 50 ug via INTRAVENOUS
  Administered 2015-02-10: 100 ug via INTRAVENOUS

## 2015-02-10 MED ORDER — LIDOCAINE HCL (CARDIAC) 20 MG/ML IV SOLN
INTRAVENOUS | Status: AC
  Filled 2015-02-10: qty 5

## 2015-02-10 MED ORDER — PROPOFOL 10 MG/ML IV BOLUS
INTRAVENOUS | Status: DC | PRN
  Administered 2015-02-10: 200 mg via INTRAVENOUS

## 2015-02-10 MED ORDER — DEXAMETHASONE SODIUM PHOSPHATE 4 MG/ML IJ SOLN
INTRAMUSCULAR | Status: AC
  Filled 2015-02-10: qty 1

## 2015-02-10 MED ORDER — FENTANYL CITRATE 0.05 MG/ML IJ SOLN
25.0000 ug | INTRAMUSCULAR | Status: DC | PRN
  Administered 2015-02-10 (×2): 50 ug via INTRAVENOUS

## 2015-02-10 MED ORDER — SODIUM CHLORIDE 0.9 % IR SOLN
Status: DC | PRN
  Administered 2015-02-10: 3000 mL

## 2015-02-10 MED ORDER — DEXAMETHASONE SODIUM PHOSPHATE 10 MG/ML IJ SOLN
INTRAMUSCULAR | Status: AC
  Filled 2015-02-10: qty 1

## 2015-02-10 MED ORDER — OXYCODONE HCL 5 MG PO TABS
ORAL_TABLET | ORAL | Status: AC
  Filled 2015-02-10: qty 1

## 2015-02-10 MED ORDER — SUCCINYLCHOLINE CHLORIDE 20 MG/ML IJ SOLN
INTRAMUSCULAR | Status: AC
  Filled 2015-02-10: qty 10

## 2015-02-10 MED ORDER — ONDANSETRON HCL 4 MG/2ML IJ SOLN
INTRAMUSCULAR | Status: AC
  Filled 2015-02-10: qty 2

## 2015-02-10 MED ORDER — SCOPOLAMINE 1 MG/3DAYS TD PT72
MEDICATED_PATCH | TRANSDERMAL | Status: DC | PRN
  Administered 2015-02-10: 1 via TRANSDERMAL

## 2015-02-10 MED ORDER — FENTANYL CITRATE 0.05 MG/ML IJ SOLN
INTRAMUSCULAR | Status: AC
Start: 2015-02-10 — End: 2015-02-10
  Filled 2015-02-10: qty 5

## 2015-02-10 MED ORDER — OXYCODONE HCL 5 MG PO TABS
5.0000 mg | ORAL_TABLET | Freq: Once | ORAL | Status: AC | PRN
  Administered 2015-02-10: 5 mg via ORAL

## 2015-02-10 MED ORDER — METOCLOPRAMIDE HCL 5 MG/ML IJ SOLN: 10.0000 mg | Freq: Once | INTRAMUSCULAR | Status: DC | PRN

## 2015-02-10 MED ORDER — MIDAZOLAM HCL 5 MG/5ML IJ SOLN
INTRAMUSCULAR | Status: DC | PRN
  Administered 2015-02-10: 2 mg via INTRAVENOUS

## 2015-02-10 MED ORDER — MEPERIDINE HCL 25 MG/ML IJ SOLN: 6.2500 mg | INTRAMUSCULAR | Status: DC | PRN

## 2015-02-10 MED ORDER — CEFAZOLIN SODIUM-DEXTROSE 2-3 GM-% IV SOLR
2.0000 g | Freq: Three times a day (TID) | INTRAVENOUS | Status: AC
  Administered 2015-02-10: 2 g via INTRAVENOUS
  Filled 2015-02-10: qty 50

## 2015-02-10 MED ORDER — LIDOCAINE HCL 1 % IJ SOLN
INTRAMUSCULAR | Status: DC | PRN
  Administered 2015-02-10: 10 mL

## 2015-02-10 MED ORDER — ONDANSETRON HCL 4 MG/2ML IJ SOLN
INTRAMUSCULAR | Status: DC | PRN
  Administered 2015-02-10: 4 mg via INTRAVENOUS

## 2015-02-10 MED ORDER — SCOPOLAMINE 1 MG/3DAYS TD PT72
MEDICATED_PATCH | TRANSDERMAL | Status: AC
  Filled 2015-02-10: qty 1

## 2015-02-10 MED ORDER — MIDAZOLAM HCL 2 MG/2ML IJ SOLN
INTRAMUSCULAR | Status: AC
  Filled 2015-02-10: qty 2

## 2015-02-10 MED ORDER — OXYCODONE HCL 5 MG/5ML PO SOLN: 5.0000 mg | Freq: Once | ORAL | Status: AC | PRN

## 2015-02-10 SURGICAL SUPPLY — 17 items
CANISTER SUCT 3000ML (MISCELLANEOUS) ×2 IMPLANT
CATH ROBINSON RED A/P 16FR (CATHETERS) ×2 IMPLANT
CLOTH BEACON ORANGE TIMEOUT ST (SAFETY) ×2 IMPLANT
CONTAINER PREFILL 10% NBF 60ML (FORM) ×3 IMPLANT
DECANTER SPIKE VIAL GLASS SM (MISCELLANEOUS) ×1 IMPLANT
FILTER ARTHROSCOPY CONVERTOR (FILTER) ×2 IMPLANT
GLOVE BIO SURGEON STRL SZ 6.5 (GLOVE) ×3 IMPLANT
GLOVE BIOGEL PI IND STRL 7.0 (GLOVE) ×1 IMPLANT
GLOVE BIOGEL PI INDICATOR 7.0 (GLOVE) ×1
GOWN STRL REUS W/TWL LRG LVL3 (GOWN DISPOSABLE) ×4 IMPLANT
PACK VAGINAL MINOR WOMEN LF (CUSTOM PROCEDURE TRAY) ×2 IMPLANT
PAD OB MATERNITY 4.3X12.25 (PERSONAL CARE ITEMS) ×2 IMPLANT
SEAL ROD LENS SCOPE MYOSURE (ABLATOR) ×2 IMPLANT
TOWEL OR 17X24 6PK STRL BLUE (TOWEL DISPOSABLE) ×4 IMPLANT
TUBING AQUILEX INFLOW (TUBING) ×2 IMPLANT
TUBING AQUILEX OUTFLOW (TUBING) ×2 IMPLANT
WATER STERILE IRR 1000ML POUR (IV SOLUTION) ×2 IMPLANT

## 2015-02-10 NOTE — MAU Note (Signed)
CRNA Deatra Canter here to start IV. Unsuccessful attempts x3. Anesthesia MD called. Will come down to start IV shortly. Lab called for order labs to be drawn due to IV delay. Pt. And SO agreeable to POC.

## 2015-02-10 NOTE — Progress Notes (Signed)
Patient ID: [name], female   DOB: [birth year], 49 y.o.   MRN: [id number] GYNECOLOGY VISIT  PCP: Baltazar Apo, MD  Referring provider:   HPI: 49 y.o.   Legally Separated  Caucasian  female   [phone] with Patient's last menstrual period was 02/03/2015.   here for vaginal bleeding.  Patient's partner is here today with her.  Seen in the ER at [facility] on 02/07/15 for heavy bleeding.  Ran out of Megace about 10 days ago and started bleeding.  Currently changing a pad every hour.  Cramping also.  Some fatigue and headache.  Was on Megace 40 mg tid. Hgb then was 13.3.  UPT negative.   Patient being followed for fibroids noted on ultrasound 09/16/14 - 3 fibroids, 2.29, 1.28, and 2.45 cm.  EMS 8.71 mm.  Ovaries normal.   EMB 09/16/14 - benign.   History of breast cancer.   Patient has had difficulty following up for recommended office visits for care of her bleeding.  Late ate at 7:00 pm last night.  GYNECOLOGIC HISTORY: Patient's last menstrual period was 02/03/2015. Sexually active:  Yes Partner preference: female Contraception:  None--abstinence  Menopausal hormone therapy: no DES exposure:  no  Blood transfusions:  1999 for heavy menses  Sexually transmitted diseases: genital warts (Pos HPV), Tx'd Chlamydia in 1990's   GYN procedures and prior surgeries:  Rt. Lumpectomy for breast cancer 02/2011. Last mammogram:  11-25-13 wnl:[facility].--scheduled on 02-15-15.               Last pap and high risk HPV testing:   09-08-14 [facility]:Pos HR HPV History of abnormal pap smear: 09-16-14 [facility] pap:Pos HR DGU:YQIHK revealed CIN I, Hx of LEEP procedure 1990's and laser to condyuloma in 1989 or 1990.   OB History    Gravida Para Term Preterm AB TAB SAB Ectopic Multiple Living   4 3 0 0 1 0 0 0 0 3        Past Medical History  Diagnosis Date  . Depression 06/22/2011  . GERD (gastroesophageal reflux disease) 06/22/2011  . DM (diabetes mellitus) 06/22/2011  .  Hypercholesterolemia 06/22/2011  . Hypertension 06/22/2011  . Obesity 06/22/2011  . Hx of adenomatous colonic polyps 10/2007    42mm sigmoid tubular adenoma, FH colon cancer, mother in mid-50s  . Anxiety   . Breast cancer 05/22/2011    03/01/11, Stage 2, s/p lumpectomy, chemo/xrt  . DDD (degenerative disc disease), lumbar   . 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 Vaginal bleeding 03/08/2014  . Shortness of breath   . Blood transfusion without reported diagnosis 1999    due to heavy menses  . Genital warts   . 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 Back surg x2    . Cholecystectomy    . Mm breast stereo bx*l*r/s      rt.  . Mastectomy partial / lumpectomy      rt.  . Colonoscopy  11/03/07    4-mm sessile polyp removed/small internal hemorrhoids/tubular adenoma, random colon bx negative for microscopic colitis  . Portacath placement    . Port-a-cath removal  05/26/2012    Procedure: REMOVAL PORT-A-CATH;  Surgeon: Jamesetta So, MD;  Location: [facility];  Service: General;  Laterality: N/A;  Minor Room    Current Outpatient Prescriptions  Medication Sig Dispense Refill  . albuterol (PROVENTIL HFA;VENTOLIN HFA)  108 (90 BASE) MCG/ACT inhaler Inhale 2 puffs into the lungs every 4 (four) hours as needed for wheezing. 1 Inhaler 5  . ALPRAZolam (XANAX) 0.5 MG tablet Take 0.5 mg by mouth 5 (five) times daily as needed for anxiety.     Marland Kitchen amoxicillin (AMOXIL) 500 MG capsule Take 1 capsule (500 mg total) by mouth 3 (three) times daily. 30 capsule 0  . aspirin EC 81 MG tablet Take 81 mg by mouth daily.    . citalopram (CELEXA) 40 MG tablet Take 1 tablet (40 mg total) by mouth daily. 90 tablet 1  . lisinopril-hydrochlorothiazide (PRINZIDE,ZESTORETIC) 10-12.5 MG per tablet Take 1 tablet by mouth daily. 90 tablet 1  . metFORMIN (GLUCOPHAGE) 500 MG tablet Take 1 tablet (500 mg total) by mouth 2 (two)  times daily. 180 tablet 1  . Omega-3 Fatty Acids (FISH OIL) 1200 MG CAPS Take 1 capsule by mouth 2 (two) times daily.    . pantoprazole (PROTONIX) 40 MG tablet Take 1 tablet (40 mg total) by mouth daily. For acid reflux 90 tablet 1  . oxycodone-acetaminophen (PERCOCET) 2.5-325 MG per tablet Take 2 tablets by mouth every 4 (four) hours as needed for pain. (Patient not taking: Reported on 02/10/2015) 30 tablet 0  . pravastatin (PRAVACHOL) 40 MG tablet Take 1 tablet (40 mg total) by mouth daily. 90 tablet 1  . [DISCONTINUED] gabapentin (NEURONTIN) 300 MG capsule Take 1 capsule (300 mg total) by mouth 3 (three) times daily. 100 capsule 3  . [DISCONTINUED] potassium chloride (KCL) 2 mEq/mL SOLN oral liquid Take 10 mL BID for 21 days, then 10 mL daily, PO 300 mL 1   No current facility-administered medications for this visit.     ALLERGIES: Review of patient's allergies indicates no known allergies.  Family History  Problem Relation Age of Onset  . Colon cancer Mother     mid-50s, died of metastatic disease  . Cancer Mother     liver  . Coronary artery disease Mother   . Diabetes type I Mother   . Coronary artery disease Father   . Diabetes Father   . Diabetes type I Father   . Hypertension Father   . Bipolar disorder Sister   . Coronary artery disease Brother   . Hypertension Brother   . Hypertension Maternal Grandmother   . Hyperlipidemia Maternal Grandmother   . Stroke Maternal Grandmother   . Hypertension Maternal Grandfather   . Diabetes Maternal Grandfather   . Diabetes Paternal Grandmother   . Heart disease Paternal Grandfather     History   Social History  . Marital Status: Legally Separated    Spouse Name: N/A  . Number of Children: 3  . 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 Smoking status: Never Smoker   . Smokeless tobacco: Never Used  . Alcohol Use: No  . Drug Use: No  . Sexual Activity:    Partners:  Male    Birth Control/ Protection: None   Other Topics Concern  . Not on file   Social History Narrative    ROS:  Pertinent items are noted in HPI.  PHYSICAL EXAMINATION:    BP 120/82 mmHg  Pulse 80  Ht 5' 3.75" (1.619 m)  Wt 248 lb 6.4 oz (112.674 kg)  BMI 42.99 kg/m2  LMP 02/03/2015   Wt Readings from Last 3 Encounters:  02/10/15 248 lb 6.4 oz (112.674 kg)  01/28/15 247  lb (112.038 kg)  10/01/14 245 lb (111.131 kg)     Ht Readings from Last 3 Encounters:  02/10/15 5' 3.75" (1.619 m)  01/28/15 5\' 4"  (1.626 m)  10/01/14 5' 3.75" (1.619 m)    General appearance: alert, cooperative and appears stated age Head: Normocephalic, without obvious abnormality, atraumatic Lungs: clear to auscultation bilaterally Heart: regular rate and rhythm Abdomen: obese,soft, non-tender; no masses,  no organomegaly Extremities: extremities normal, atraumatic, no cyanosis or edema No abnormal inguinal nodes palpated Neurologic: Grossly normal  Pelvic: External genitalia:  no lesions              Urethra:  normal appearing urethra with no masses, tenderness or lesions              Bartholins and Skenes: normal                 Vagina: normal appearing vagina with normal color and discharge, no lesions              Cervix: normal appearance.  Heavy bleeding from os.                 Bimanual Exam:  Uterus:  uterus is normal size, shape, consistency and nontender                                      Adnexa: normal adnexa in size, nontender and no masses                                      ASSESSMENT  Menorrhagia.  Known fibroids. Withdrawal from Megace.  History of LGSIL of cervix. History of breast cancer.   PLAN  Will send patient to hospital now for hysteroscopy with dilation and curettage.  I discussed risks of procedure including but not limited to bleeding, infection, damage to surrounding organs, uterine perforation requiring laparoscopy and uterine cautery, blood transfusion,  reaction to anesthesia, DVT, PE, death, and need for further surgery.  Patient accepts plan and wishes to proceed.  Type and screen.  To [facility] now.   An After Visit Summary was printed and given to the patient.  15 minutes face to face time of which over 50% was spent in counseling.

## 2015-02-10 NOTE — Op Note (Signed)
NAMEDORETHY, TOMEY         ACCOUNT NO.:  [id number]  MEDICAL RECORD NO.:  [id number]  LOCATION:  [facility]                          FACILITY:  [place]  PHYSICIAN:  Lenard Galloway, M.D.   DATE OF BIRTH:  29-May-1966  DATE OF PROCEDURE:  02/10/2015 DATE OF DISCHARGE:  02/10/2015                              OPERATIVE REPORT   PREOPERATIVE DIAGNOSIS:  Menorrhagia, uterine fibroids.  POSTOPERATIVE DIAGNOSIS:  Menorrhagia, uterine fibroids.  PROCEDURE:  Hysteroscopy with dilation and curettage.  SURGEON:  Lenard Galloway, M.D.  ANESTHESIA:  General endotracheal, paracervical block with 1% lidocaine.  IV FLUIDS:  EBL, 50 mL.  URINE OUTPUT:  400 mL, lactated Ringer's deficit 300 mL.  COMPLICATIONS:  None.  INDICATIONS FOR THE PROCEDURE:  The patient is a 49 year old para 71 female, who presents with heavy vaginal bleeding.  The patient was seen in the [facility] Emergency Department on February 07, 2015, for heavy bleeding, and at that time, her hemoglobin was noted to be 13.3 and her  Bleeding was confirm to be brown blood.  The patient has known uterine fibroids and her bleeding has been treated with Megace therapy.  The patient ran out of the Megace and has been experiencing heavy bleeding.  The patient previously had a diagnosis of three uterine fibroids measuring 2.29, 1.28 and 2.45 cm. Her endometrium has measured 8.71 mm.  An endometrial biopsy in October of 2015 was benign.  The patient is now evaluated in the office and is noted to have heavy active vaginal bleeding and clots passing from the cervix.  A plan is made to proceed with a hysteroscopy and dilation and curettage.  Risks, benefits, and alternatives have been reviewed with the patient who wishes to proceed.  FINDINGS:  Exam under anesthesia revealed a small anteverted, mobile uterus.  No adnexal masses were appreciated.  The patient was noted to have clots coming from the cervix.  Hysteroscopy demonstrated the  uterine cavity filled with clots.  There were no specific endometrial polyps, no uterine fibroids, which were visualized.  Visualization was somewhat difficult due to the clots, but there were no obvious intracavitary lesions.  The tubal ostial regions were identified well.  SPECIMEN:  Moderate amount of endometrial curettings was sent to Pathology.  DESCRIPTION OF PROCEDURE:  The patient was identified in the maternity admissions area.  She was escorted down to the operating room.  She did receive Ancef 2 g IV and she received TED hose and PAS stockings for DVT prophylaxis.  In the operating room, the patient was placed in the dorsal lithotomy position in [place].  She received general endotracheal anesthesia.  The lower abdomen, vagina and perineum were then sterilely prepped and she was draped.  The procedure began with an examination under anesthesia.  A speculum was then placed in the vagina and a single-tooth tenaculum was placed on the anterior cervical lip.  A paracervical block was performed with 10 mL of 1% lidocaine.  The uterus was sounded to 7 cm.  The cervix was then dilated to a #19 Pratt dilator.  The hysteroscope was inserted into the uterine cavity under the continuous infusion of lactated Ringer solution.  The findings were as noted above.  The hysteroscope was withdrawn and the uterine cavity was then curetted with both a smoothed and then a serrated curette.  A moderate amount of tissue was obtained and was sent to Pathology.  The hysteroscope was then inserted again inside the uterine cavity under the continuous infusion of lactated Ringer solution.  There were no obvious lesions in the uterine cavity at this time.  The hysteroscope was withdrawn.  Some compression was applied to the anterior cervix with a tenaculum had been placed and also to the posterior cervix where the paracervical block was performed.  There was no active bleeding coming from  within the uterine cavity.  All vaginal instruments were removed at this time.  The hemostasis was good.  The patient was cleansed of any Betadine.  She was awakened and extubated, and escorted to the recovery room.  There were no complications to the procedure.  All needle, instrument, and sponge counts were correct.     Lenard Galloway, M.D.     BES/MEDQ  D:  02/10/2015  T:  02/10/2015  Job:  [id number]

## 2015-02-10 NOTE — Anesthesia Procedure Notes (Signed)
Procedure Name: Intubation Date/Time: 02/10/2015 12:31 PM Performed by: Flossie Dibble Pre-anesthesia Checklist: Patient identified, Timeout performed, Emergency Drugs available, Suction available and Patient being monitored Patient Re-evaluated:Patient Re-evaluated prior to inductionOxygen Delivery Method: Circle system utilized Preoxygenation: Pre-oxygenation with 100% oxygen Intubation Type: IV induction, Rapid sequence and Cricoid Pressure applied Laryngoscope Size: Miller and 2 Grade View: Grade I Tube type: Oral Tube size: 7.0 mm Number of attempts: 1 Airway Equipment and Method: Patient positioned with wedge pillow and Stylet Placement Confirmation: ETT inserted through vocal cords under direct vision,  positive ETCO2 and breath sounds checked- equal and bilateral Secured at: 20 cm Tube secured with: Tape Dental Injury: Teeth and Oropharynx as per pre-operative assessment

## 2015-02-10 NOTE — Brief Op Note (Signed)
02/10/2015  1:03 PM  PATIENT:  [name]  49 y.o. female  PRE-OPERATIVE DIAGNOSIS:  Dysfunctional Uterine Bleeding  POST-OPERATIVE DIAGNOSIS:  Dysfunctional Uterine Bleeding  PROCEDURE:  Procedure(s): DILATATION & CURETTAGE/HYSTEROSCOPY WITH MYOSURE (N/A)  SURGEON:  Surgeon(s) and Role:    * Lucky Alverson E Yisroel Ramming, MD - Primary  PHYSICIAN ASSISTANT: NA  ASSISTANTS: none   ANESTHESIA:   general and paracervical block  EBL:  Total I/O In: -  Out: 450 [Urine:400; Blood:50]  BLOOD ADMINISTERED:none  DRAINS: none   LOCAL MEDICATIONS USED:  LIDOCAINE  and Amount: 10  ml  SPECIMEN:  Source of Specimen:  endometrial curettings  DISPOSITION OF SPECIMEN:  PATHOLOGY  COUNTS:  YES  TOURNIQUET:  * No tourniquets in log *  DICTATION: .Other Dictation: Dictation Number    PLAN OF CARE: Discharge to home after PACU  PATIENT DISPOSITION:  PACU - hemodynamically stable.   Delay start of Pharmacological VTE agent (>24hrs) due to surgical blood loss or risk of bleeding: not applicable

## 2015-02-10 NOTE — Transfer of Care (Signed)
Immediate Anesthesia Transfer of Care Note  Patient: [name]  Procedure(s) Performed: Procedure(s): DILATATION & CURETTAGE/HYSTEROSCOPY WITH MYOSURE (N/A)  Patient Location: PACU  Anesthesia Type:General  Level of Consciousness: awake, alert  and oriented  Airway & Oxygen Therapy: Patient Spontanous Breathing and Patient connected to nasal cannula oxygen  Post-op Assessment: Report given to RN and Post -op Vital signs reviewed and stable  Post vital signs: Reviewed and stable  Last Vitals:  Filed Vitals:   02/10/15 0940  BP: 110/70  Pulse: 95  Temp: 36.9 C  Resp: 20    Complications: No apparent anesthesia complications

## 2015-02-10 NOTE — Anesthesia Postprocedure Evaluation (Signed)
  Anesthesia Post-op Note  Patient: [name]  Procedure(s) Performed: Procedure(s): DILATATION & CURETTAGE/HYSTEROSCOPY WITH MYOSURE (N/A)  Patient Location: PACU  Anesthesia Type:General  Level of Consciousness: awake, alert  and oriented  Airway and Oxygen Therapy: Patient Spontanous Breathing  Post-op Pain: none  Post-op Assessment: Post-op Vital signs reviewed, Patient's Cardiovascular Status Stable, Respiratory Function Stable, Patent Airway, No signs of Nausea or vomiting and Pain level controlled  Post-op Vital Signs: Reviewed and stable  Last Vitals:  Filed Vitals:   02/10/15 1330  BP: 123/59  Pulse: 90  Temp:   Resp: 12    Complications: No apparent anesthesia complications

## 2015-02-10 NOTE — Anesthesia Preprocedure Evaluation (Addendum)
Anesthesia Evaluation  Patient identified by MRN, date of birth, ID band Patient awake    Reviewed: Allergy & Precautions, NPO status , Patient's Chart, lab work & pertinent test results  History of Anesthesia Complications Negative for: history of anesthetic complications  Airway Mallampati: III  TM Distance: >3 FB Neck ROM: Full    Dental no notable dental hx. (+) Dental Advisory Given   Pulmonary neg pulmonary ROS,  breath sounds clear to auscultation  Pulmonary exam normal       Cardiovascular hypertension, Pt. on medications Rhythm:Regular Rate:Normal     Neuro/Psych PSYCHIATRIC DISORDERS Anxiety Depression negative neurological ROS     GI/Hepatic Neg liver ROS, GERD-  Medicated and Controlled,  Endo/Other    Renal/GU negative Renal ROS  negative genitourinary   Musculoskeletal  (+) Arthritis -, Osteoarthritis,    Abdominal   Peds negative pediatric ROS (+)  Hematology negative hematology ROS (+)   Anesthesia Other Findings Hx of breast ca on the R  Reproductive/Obstetrics negative OB ROS                            Anesthesia Physical Anesthesia Plan  ASA: II and emergent  Anesthesia Plan: General   Post-op Pain Management:    Induction: Intravenous, Rapid sequence and Cricoid pressure planned  Airway Management Planned: Oral ETT  Additional Equipment:   Intra-op Plan:   Post-operative Plan: Extubation in OR  Informed Consent: I have reviewed the patients History and Physical, chart, labs and discussed the procedure including the risks, benefits and alternatives for the proposed anesthesia with the patient or authorized representative who has indicated his/her understanding and acceptance.   Dental advisory given  Plan Discussed with: CRNA  Anesthesia Plan Comments:        Anesthesia Quick Evaluation

## 2015-02-10 NOTE — Addendum Note (Signed)
Addendum  created 02/10/15 1431 by Josephine Igo, MD   Modules edited: Orders, PRL Based Order Sets

## 2015-02-10 NOTE — MAU Note (Signed)
Pt. Went to gyne due to bleeding and cramping that began Thursday. Was on Megace since last April 2015. Pt. States she ran out and began bleeding. Here for add on D & C.

## 2015-02-10 NOTE — MAU Note (Signed)
Urine in lab 

## 2015-02-10 NOTE — Discharge Instructions (Addendum)
[name],   Your uterine cavity was filled with clots and some thickened endometrial tissue.  We sent a good specimen of this tissue to the lab for analysis.  We will wait for the reports to come back before we proceed with further treatment.  Please call if you have heavy bleeding with changing a pad every hour, if you have increasing pain, or if you have fever.  I will see you in the office in one week for your post operative check.   Thank you,   Josefa Half, MD  Dilation and Curettage or Vacuum Curettage, Care After Refer to this sheet in the next few weeks. These instructions provide you with information on caring for yourself after your procedure. Your health care provider may also give you more specific instructions. Your treatment has been planned according to current medical practices, but problems sometimes occur. Call your health care provider if you have any problems or questions after your procedure. WHAT TO EXPECT AFTER THE PROCEDURE After your procedure, it is typical to have light cramping and bleeding. This may last for 2 days to 2 weeks after the procedure. HOME CARE INSTRUCTIONS   Do not drive for 24 hours.  Wait 1 week before returning to strenuous activities.  Take your temperature 2 times a day for 4 days and write it down. Provide these temperatures to your health care provider if you develop a fever.  Avoid long periods of standing.  Avoid heavy lifting, pushing, or pulling. Do not lift anything heavier than 10 pounds (4.5 kg).  Limit stair climbing to once or twice a day.  Take rest periods often.  You may resume your usual diet.  Drink enough fluids to keep your urine clear or pale yellow.  Your usual bowel function should return. If you have constipation, you may:  Take a mild laxative with permission from your health care provider.  Add fruit and bran to your diet.  Drink more fluids.  Take showers instead of baths until your health care provider  gives you permission to take baths.  Do not go swimming or use a hot tub until your health care provider approves.  Try to have someone with you or available to you the first 24-48 hours, especially if you were given a general anesthetic.  Do not douche, use tampons, or have sex (intercourse) for 2 weeks after the procedure.  Only take over-the-counter or prescription medicines as directed by your health care provider. Do not take aspirin. It can cause bleeding.  Follow up with your health care provider as directed. SEEK MEDICAL CARE IF:   You have increasing cramps or pain that is not relieved with medicine.  You have abdominal pain that does not seem to be related to the same area of earlier cramping and pain.  You have bad smelling vaginal discharge.  You have a rash.  You are having problems with any medicine. SEEK IMMEDIATE MEDICAL CARE IF:   You have bleeding that is heavier than a normal menstrual period.  You have a fever.  You have chest pain.  You have shortness of breath.  You feel dizzy or feel like fainting.  You pass out.  You have pain in your shoulder strap area.  You have heavy vaginal bleeding with or without blood clots. MAKE SURE YOU:   Understand these instructions.  Will watch your condition.  Will get help right away if you are not doing well or get worse. Document Released: 11/02/2000 Document Revised:  11/10/2013 Document Reviewed: 06/04/2013 ExitCare Patient Information 2015 [place], [place]. This information is not intended to replace advice given to you by your health care provider. Make sure you discuss any questions you have with your health care provider. DISCHARGE INSTRUCTIONS: D&C / D&E The following instructions have been prepared to help you care for yourself upon your return home.   Personal hygiene:  Use sanitary pads for vaginal drainage, not tampons.  Shower the day after your procedure.  NO tub baths, pools or Jacuzzis for  2-3 weeks.  Wipe front to back after using the bathroom.  Activity and limitations:  Do NOT drive or operate any equipment for 24 hours. The effects of anesthesia are still present and drowsiness may result.  Do NOT rest in bed all day.  Walking is encouraged.  Walk up and down stairs slowly.  You may resume your normal activity in one to two days or as indicated by your physician.  Sexual activity: NO intercourse for at least 2 weeks after the procedure, or as indicated by your physician.  Diet: Eat a light meal as desired this evening. You may resume your usual diet tomorrow.  Return to work: You may resume your work activities in one to two days or as indicated by your doctor.  What to expect after your surgery: Expect to have vaginal bleeding/discharge for 2-3 days and spotting for up to 10 days. It is not unusual to have soreness for up to 1-2 weeks. You may have a slight burning sensation when you urinate for the first day. Mild cramps may continue for a couple of days. You may have a regular period in 2-6 weeks.  Call your doctor for any of the following:  Excessive vaginal bleeding, saturating and changing one pad every hour.  Inability to urinate 6 hours after discharge from hospital.  Pain not relieved by pain medication.  Fever of 100.4 F or greater.  Unusual vaginal discharge or odor.   Call for an appointment:    Patients signature: ______________________  Nurses signature ________________________  Support person's signature_______________________

## 2015-02-11 ENCOUNTER — Encounter (HOSPITAL_COMMUNITY): Payer: Self-pay | Admitting: Obstetrics and Gynecology

## 2015-02-14 LAB — HEMOGLOBIN, FINGERSTICK: Hemoglobin, fingerstick: 10 g/dL — ABNORMAL LOW (ref 12.0–16.0)

## 2015-02-15 ENCOUNTER — Ambulatory Visit (HOSPITAL_COMMUNITY)
Admission: RE | Admit: 2015-02-15 | Discharge: 2015-02-15 | Disposition: A | Discharge status: Home / Self Care | Payer: 59 | Source: Ambulatory Visit | Attending: Oncology | Admitting: Oncology

## 2015-02-15 DIAGNOSIS — Z9889 Other specified postprocedural states: Secondary | ICD-10-CM

## 2015-02-15 DIAGNOSIS — Z853 Personal history of malignant neoplasm of breast: Secondary | ICD-10-CM | POA: Insufficient documentation

## 2015-02-16 ENCOUNTER — Telehealth: Payer: Self-pay | Admitting: Emergency Medicine

## 2015-02-16 NOTE — Telephone Encounter (Signed)
Spoke with patient. She is given message from Dr. Quincy Simmonds. She states she is feeling much improved and is not having any bleeding at all. She declines an appointment for this week for follow up due to her work schedule. She requests a very early morning appointment or very latest appointment in afternoon. Discussed multiple times that are open and first available that will work with patient schedule is Friday 02/25/15 at 1545. This appointment is scheduled. Patient states she absolutely cannot miss work. She is advised to call back if any concerns prior to appointment.

## 2015-02-16 NOTE — Telephone Encounter (Signed)
-----   Message from Nunzio Cobbs, MD sent at 02/15/2015 10:17 PM EDT ----- Please report surgical pathology report showing benign endometrium but a limited sample.  Mostly clot was seen.  Patient needs to return this week to see me for a post op visit. Please facilitate an appointment.

## 2015-02-16 NOTE — Telephone Encounter (Signed)
Message left to return call to Koben Daman at [phone].    

## 2015-02-16 NOTE — Telephone Encounter (Signed)
Thank you for the update.  I have closed the encounter.  

## 2015-02-16 NOTE — Telephone Encounter (Signed)
Message left to return call to Nyquan Selbe at [phone].    

## 2015-02-25 ENCOUNTER — Encounter: Payer: Self-pay | Admitting: Obstetrics and Gynecology

## 2015-02-25 ENCOUNTER — Ambulatory Visit (INDEPENDENT_AMBULATORY_CARE_PROVIDER_SITE_OTHER): Payer: 59 | Admitting: Obstetrics and Gynecology

## 2015-02-25 VITALS — BP 110/70 | HR 100 | Ht 63.75 in | Wt 252.4 lb

## 2015-02-25 DIAGNOSIS — N921 Excessive and frequent menstruation with irregular cycle: Secondary | ICD-10-CM | POA: Diagnosis not present

## 2015-02-25 DIAGNOSIS — R896 Abnormal cytological findings in specimens from other organs, systems and tissues: Secondary | ICD-10-CM | POA: Diagnosis not present

## 2015-02-25 DIAGNOSIS — D259 Leiomyoma of uterus, unspecified: Secondary | ICD-10-CM

## 2015-02-25 DIAGNOSIS — IMO0002 Reserved for concepts with insufficient information to code with codable children: Secondary | ICD-10-CM

## 2015-02-25 NOTE — Progress Notes (Signed)
Patient ID: [name], female   DOB: 12-03-1965, 49 y.o.   MRN: [id number] GYNECOLOGY  VISIT   HPI: 49 y.o.   Legally Separated  Caucasian  female   [phone] with Patient's last menstrual period was 02/03/2015.   here for  2 week post op.  Bled for 4 days after hysteroscopy and dilation and curettage and then stopped.  Surgery was done for heavy vaginal bleeding following discontinuation of Megace due to running out of it.  Pathology report showed blood and degenerating secretory endometrium, scanty sample No atypia or malignancy noted.  Patient has known fibroids and has been on Megace for almost one year due to heavy bleeding and difficulty with follow up office visits.  Megace has controlled bleeding, but when patient runs out, bleeding begins again.   Increased hot flashes the last few months.   Colpo 10/01/14 - LGSIL.  History of invasive breast cancer, ER negative, PR positive about 4%.  GYNECOLOGIC HISTORY: Patient's last menstrual period was 02/03/2015. Contraception:   None Menopausal hormone therapy: n/a        OB History    Gravida Para Term Preterm AB TAB SAB Ectopic Multiple Living   4 3 0 0 1 0 0 0 0 3          Patient Active Problem List   Diagnosis Date Noted  . Excessive or frequent menstruation 04/08/2014  . Dysmenorrhea 04/08/2014  . H/O adenomatous polyp of colon 01/24/2012  . FH: colon cancer 01/24/2012  . Esophageal dysphagia 01/24/2012  . Chronic diarrhea 01/24/2012  . Depression 06/22/2011  . GERD (gastroesophageal reflux disease) 06/22/2011  . DM (diabetes mellitus) 06/22/2011  . Hypercholesterolemia 06/22/2011  . Hypertension 06/22/2011  . Obesity 06/22/2011  . 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 Depression 06/22/2011  . GERD (gastroesophageal reflux disease) 06/22/2011  . DM (diabetes mellitus) 06/22/2011  . Hypercholesterolemia 06/22/2011  . Hypertension 06/22/2011  .  Obesity 06/22/2011  . Hx of adenomatous colonic polyps 10/2007    20mm sigmoid tubular adenoma, FH colon cancer, mother in mid-50s  . Anxiety   . Breast cancer 05/22/2011    03/01/11, Stage 2, s/p lumpectomy, chemo/xrt  . DDD (degenerative disc disease), lumbar   . 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 Vaginal bleeding 03/08/2014  . Shortness of breath   . Blood transfusion without reported diagnosis 1999    due to heavy menses  . Genital warts   . 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 Back surg x2    . Cholecystectomy    . Mm breast stereo bx*l*r/s      rt.  . Mastectomy partial / lumpectomy      rt.  . Colonoscopy  11/03/07    4-mm sessile polyp removed/small internal hemorrhoids/tubular adenoma, random colon bx negative for microscopic colitis  . Portacath placement    . Port-a-cath removal  05/26/2012    Procedure: REMOVAL PORT-A-CATH;  Surgeon: Jamesetta So, MD;  Location: [facility];  Service: General;  Laterality: N/A;  Minor Room  . Dilatation & curettage/hysteroscopy with myosure N/A 02/10/2015    Procedure: DILATATION & CURETTAGE/HYSTEROSCOPY WITH MYOSURE;  Surgeon: Nunzio Cobbs, MD;  Location: [facility];  Service: Gynecology;  Laterality: N/A;    Current Outpatient Prescriptions  Medication Sig Dispense Refill  .  albuterol (PROVENTIL HFA;VENTOLIN HFA) 108 (90 BASE) MCG/ACT inhaler Inhale 2 puffs into the lungs every 4 (four) hours as needed for wheezing. 1 Inhaler 5  . ALPRAZolam (XANAX) 0.5 MG tablet Take 0.5 mg by mouth 5 (five) times daily as needed for anxiety.     . citalopram (CELEXA) 40 MG tablet Take 1 tablet (40 mg total) by mouth daily. 90 tablet 1  . lisinopril-hydrochlorothiazide (PRINZIDE,ZESTORETIC) 10-12.5 MG per tablet Take 1 tablet by mouth daily. 90 tablet 1  . pantoprazole (PROTONIX) 40 MG tablet Take 1 tablet (40 mg total) by mouth daily. For  acid reflux 90 tablet 1  . pravastatin (PRAVACHOL) 40 MG tablet Take 1 tablet (40 mg total) by mouth daily. 90 tablet 1  . ibuprofen (ADVIL,MOTRIN) 800 MG tablet Take 1 tablet (800 mg total) by mouth every 8 (eight) hours as needed. 30 tablet 0  . metFORMIN (GLUCOPHAGE) 500 MG tablet Take 1 tablet (500 mg total) by mouth 2 (two) times daily. 180 tablet 1  . [DISCONTINUED] gabapentin (NEURONTIN) 300 MG capsule Take 1 capsule (300 mg total) by mouth 3 (three) times daily. 100 capsule 3  . [DISCONTINUED] potassium chloride (KCL) 2 mEq/mL SOLN oral liquid Take 10 mL BID for 21 days, then 10 mL daily, PO 300 mL 1   No current facility-administered medications for this visit.     ALLERGIES: Review of patient's allergies indicates no known allergies.  Family History  Problem Relation Age of Onset  . Colon cancer Mother     mid-50s, died of metastatic disease  . Cancer Mother     liver  . Coronary artery disease Mother   . Diabetes type I Mother   . Coronary artery disease Father   . Diabetes Father   . Diabetes type I Father   . Hypertension Father   . Bipolar disorder Sister   . Coronary artery disease Brother   . Hypertension Brother   . Hypertension Maternal Grandmother   . Hyperlipidemia Maternal Grandmother   . Stroke Maternal Grandmother   . Hypertension Maternal Grandfather   . Diabetes Maternal Grandfather   . Diabetes Paternal Grandmother   . Heart disease Paternal Grandfather     History   Social History  . Marital Status: Legally Separated    Spouse Name: N/A  . Number of Children: 3  . 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 Smoking status: Never Smoker   . Smokeless tobacco: Never Used  . Alcohol Use: No  . Drug Use: No  . Sexual Activity:    Partners: Male    Birth Control/ Protection: None   Other Topics Concern  . Not on file   Social History Narrative    ROS:  Pertinent items are noted in  HPI.  PHYSICAL EXAMINATION:    BP 110/70 mmHg  Pulse 100  Ht 5' 3.75" (1.619 m)  Wt 252 lb 6.4 oz (114.488 kg)  BMI 43.68 kg/m2  LMP 02/03/2015     General appearance: alert, cooperative and appears stated age   Abdomen: obese, soft, non-tender; no masses,  no organomegaly   Pelvic: External genitalia:  no lesions              Urethra:  normal appearing urethra with no masses, tenderness or lesions              Bartholins and Skenes: normal  Vagina: normal appearing vagina with normal color and discharge, no lesions              Cervix: normal appearance.  No blood noted.                    Bimanual Exam:  Uterus:  uterus is normal size, shape, consistency and nontender                                      Adnexa: normal adnexa in size, nontender and no masses                                 ASSESSMENT  Status post hysteroscopy with dilation and curettage for acute menorrhagia episode. Fibroids.  Status post Megace therapy.  LGSIL.   PLAN  Reviewed fibroids, Megace use, endometrial atrophy due to treatment.  Will do observation of bleeding.  If recurrent bleeding again, uterine artery embolization versus hysterectomy.  I explained these two options to patient.   Needs follow up pap/HR HPV testing in November 2016.  Bleeding precautions to patient  Return prn also.    An After Visit Summary was printed and given to the patient.  ___25___ minutes face to face time of which over 50% was spent in counseling.

## 2015-02-27 ENCOUNTER — Encounter: Payer: Self-pay | Admitting: Obstetrics and Gynecology

## 2015-03-18 ENCOUNTER — Ambulatory Visit (HOSPITAL_COMMUNITY): Payer: 59 | Admitting: Oncology

## 2015-03-25 ENCOUNTER — Encounter (HOSPITAL_COMMUNITY): Payer: 59 | Attending: Oncology | Admitting: Oncology

## 2015-03-25 ENCOUNTER — Encounter (HOSPITAL_COMMUNITY): Payer: Self-pay | Admitting: Oncology

## 2015-03-25 ENCOUNTER — Ambulatory Visit (HOSPITAL_COMMUNITY)
Admission: RE | Admit: 2015-03-25 | Discharge: 2015-03-25 | Disposition: A | Discharge status: Home / Self Care | Payer: 59 | Source: Ambulatory Visit | Attending: Oncology | Admitting: Oncology

## 2015-03-25 VITALS — BP 119/84 | HR 95 | Temp 98.8°F | Resp 16 | Wt 254.5 lb

## 2015-03-25 DIAGNOSIS — Z853 Personal history of malignant neoplasm of breast: Secondary | ICD-10-CM | POA: Insufficient documentation

## 2015-03-25 DIAGNOSIS — C50911 Malignant neoplasm of unspecified site of right female breast: Secondary | ICD-10-CM | POA: Diagnosis not present

## 2015-03-25 DIAGNOSIS — D500 Iron deficiency anemia secondary to blood loss (chronic): Secondary | ICD-10-CM | POA: Diagnosis not present

## 2015-03-25 DIAGNOSIS — D509 Iron deficiency anemia, unspecified: Secondary | ICD-10-CM | POA: Diagnosis not present

## 2015-03-25 DIAGNOSIS — C50111 Malignant neoplasm of central portion of right female breast: Secondary | ICD-10-CM

## 2015-03-25 DIAGNOSIS — M79605 Pain in left leg: Secondary | ICD-10-CM

## 2015-03-25 DIAGNOSIS — Z85038 Personal history of other malignant neoplasm of large intestine: Secondary | ICD-10-CM | POA: Insufficient documentation

## 2015-03-25 DIAGNOSIS — N920 Excessive and frequent menstruation with regular cycle: Secondary | ICD-10-CM

## 2015-03-25 HISTORY — DX: Iron deficiency anemia, unspecified: D50.9

## 2015-03-25 LAB — COMPREHENSIVE METABOLIC PANEL
ALT: 19 U/L (ref 14–54)
AST: 20 U/L (ref 15–41)
Albumin: 3.9 g/dL (ref 3.5–5.0)
Alkaline Phosphatase: 46 U/L (ref 38–126)
Anion gap: 11 (ref 5–15)
BUN: 14 mg/dL (ref 6–20)
CHLORIDE: 99 mmol/L — AB (ref 101–111)
CO2: 26 mmol/L (ref 22–32)
Calcium: 8.8 mg/dL — ABNORMAL LOW (ref 8.9–10.3)
Creatinine, Ser: 0.74 mg/dL (ref 0.44–1.00)
GFR calc Af Amer: >60 mL/min (ref >60)
GFR calc non Af Amer: >60 mL/min (ref >60)
Glucose, Bld: 102 mg/dL — ABNORMAL HIGH (ref 70–99)
Potassium: 3.6 mmol/L (ref 3.5–5.1)
SODIUM: 136 mmol/L (ref 135–145)
Total Bilirubin: 0.4 mg/dL (ref 0.3–1.2)
Total Protein: 7.7 g/dL (ref 6.5–8.1)

## 2015-03-25 LAB — CBC WITH DIFFERENTIAL/PLATELET
Basophils Absolute: 0 10*3/uL (ref 0.0–0.1)
Basophils Relative: 0 % (ref 0–1)
EOS PCT: 3 % (ref 0–5)
Eosinophils Absolute: 0.2 10*3/uL (ref 0.0–0.7)
HCT: 38.4 % (ref 36.0–46.0)
Hemoglobin: 12 g/dL (ref 12.0–15.0)
LYMPHS ABS: 1.9 10*3/uL (ref 0.7–4.0)
Lymphocytes Relative: 26 % (ref 12–46)
MCH: 27.8 pg (ref 26.0–34.0)
MCHC: 31.3 g/dL (ref 30.0–36.0)
MCV: 88.9 fL (ref 78.0–100.0)
Monocytes Absolute: 0.6 10*3/uL (ref 0.1–1.0)
Monocytes Relative: 8 % (ref 3–12)
Neutro Abs: 4.6 10*3/uL (ref 1.7–7.7)
Neutrophils Relative %: 63 % (ref 43–77)
Platelets: 224 10*3/uL (ref 150–400)
RBC: 4.32 MIL/uL (ref 3.87–5.11)
RDW: 14.5 % (ref 11.5–15.5)
WBC: 7.3 10*3/uL (ref 4.0–10.5)

## 2015-03-25 LAB — FERRITIN: Ferritin: 18 ng/mL (ref 11–307)

## 2015-03-25 LAB — IRON AND TIBC
Iron: 45 ug/dL (ref 28–170)
Saturation Ratios: 11 % (ref 10.4–31.8)
TIBC: 407 ug/dL (ref 250–450)
UIBC: 362 ug/dL

## 2015-03-25 NOTE — Addendum Note (Signed)
Addended by: Baird Cancer on: 03/25/2015 04:54 PM   Modules accepted: Level of Service

## 2015-03-25 NOTE — Assessment & Plan Note (Addendum)
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 This was an ER negative, PR 2%, Ki-67 marker high at 90%, Her2 negative cancer. S/P lumpectomy followed by adjuvant chemotherapy with 4 cycles of AC followed by 12 weekly Taxol chemotherapy completed on 10/19/11. She also received breast irradiation completed in 12/31/2011. Tamoxifen recommended by Dr. Ikenna Osuorji, but has not started due to patient's fear and continued to be on hold secondary to abnormal vaginal bleeding being followed by Gyn, Dr. Silva.  Oncology history updated.  Labs today: CBC diff, CMET, Vit D, iron/TIBC, ferritin.  Due to left leg pain, I have ordered left femur and tib/fib films to rule out bone metastasis.  She recently start Pravachol in March 2016, and she notes that her discomfort increased since then.  I have asked her to hold her Pravachol for 2 weeks to see if this helps with her discomfort.  If not better, she can restart her statin, if better, then she is to follow-up with her primary care provider regarding this.  Return in 8 weeks for follow-up. 
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
## 2015-03-25 NOTE — Assessment & Plan Note (Addendum)
Secondary to abnormal vaginal bleeding, not Tamoxifen-induced as it pre-dated Tamoxifen start date.  S/P IV Feraheme in April 2015.  Labs today: iron/TIBC, ferritin.  Addendum: Iron studies reported.  Iron deficit is calculated at 493.  Will administer 1 dose of IV Feraheme 510 mg.  Order signed and held.

## 2015-03-25 NOTE — Assessment & Plan Note (Addendum)
Irregular menstrual bleeding, managed by Gyn.  Not Tamoxifen-induced as she has not started the medication. S/P hysteroscopy with dilation and curettage by Dr. Quincy Simmonds in March 2016.  Pathology is negative for malignancy.

## 2015-03-25 NOTE — Patient Instructions (Signed)
..  [place] at [facility] Discharge Instructions  RECOMMENDATIONS MADE BY THE CONSULTANT AND ANY TEST RESULTS WILL BE SENT TO [place] Physician  Do NOT start Tamoxifen. X-rays of left leg.  Will call with results. Labs today, will also call with results. Return in 8 weeks for follow up appointment. Call for any new or worsening symptoms, questions, or concerns.   Thank you for choosing [place] at [facility] to provide your oncology and hematology care.  To afford each patient quality time with our provider, please arrive at least 15 minutes before your scheduled appointment time.    You need to re-schedule your appointment should you arrive 10 or more minutes late.  We strive to give you quality time with our providers, and arriving late affects you and other patients whose appointments are after yours.  Also, if you no show three or more times for appointments you may be dismissed from the clinic at the providers discretion.     Again, thank you for choosing [facility].  Our hope is that these requests will decrease the amount of time that you wait before being seen by our physicians.       _____________________________________________________________  Should you have questions after your visit to [facility], please contact our office at [phone] between the hours of 8:30 a.m. and 4:30 p.m.  Voicemails left after 4:30 p.m. will not be returned until the following business day.  For prescription refill requests, have your pharmacy contact our office.

## 2015-03-26 LAB — VITAMIN D 25 HYDROXY (VIT D DEFICIENCY, FRACTURES): Vit D, 25-Hydroxy: 9.8 ng/mL — ABNORMAL LOW (ref 30.0–100.0)

## 2015-03-28 ENCOUNTER — Other Ambulatory Visit (HOSPITAL_COMMUNITY): Payer: Self-pay | Admitting: Oncology

## 2015-03-28 DIAGNOSIS — E559 Vitamin D deficiency, unspecified: Secondary | ICD-10-CM

## 2015-03-28 MED ORDER — ERGOCALCIFEROL 1.25 MG (50000 UT) PO CAPS: 50000.0000 [IU] | ORAL_CAPSULE | ORAL | Status: DC

## 2015-03-29 ENCOUNTER — Telehealth (HOSPITAL_COMMUNITY): Payer: Self-pay | Admitting: Emergency Medicine

## 2015-03-29 ENCOUNTER — Encounter (HOSPITAL_BASED_OUTPATIENT_CLINIC_OR_DEPARTMENT_OTHER): Payer: 59

## 2015-03-29 VITALS — BP 101/54 | HR 84 | Temp 97.5°F | Resp 18

## 2015-03-29 DIAGNOSIS — N939 Abnormal uterine and vaginal bleeding, unspecified: Secondary | ICD-10-CM

## 2015-03-29 DIAGNOSIS — D500 Iron deficiency anemia secondary to blood loss (chronic): Secondary | ICD-10-CM

## 2015-03-29 DIAGNOSIS — D509 Iron deficiency anemia, unspecified: Secondary | ICD-10-CM

## 2015-03-29 MED ORDER — SODIUM CHLORIDE 0.9 % IV SOLN
INTRAVENOUS | Status: DC
  Administered 2015-03-29: 15:00:00 via INTRAVENOUS

## 2015-03-29 MED ORDER — FERUMOXYTOL INJECTION 510 MG/17 ML
510.0000 mg | Freq: Once | INTRAVENOUS | Status: AC
  Administered 2015-03-29: 510 mg via INTRAVENOUS
  Filled 2015-03-29: qty 17

## 2015-03-29 MED ORDER — SODIUM CHLORIDE 0.9 % IJ SOLN
10.0000 mL | Freq: Once | INTRAMUSCULAR | Status: AC
  Administered 2015-03-29: 10 mL via INTRAVENOUS

## 2015-03-29 NOTE — Telephone Encounter (Signed)
-----   Message from Baird Cancer, PA-C sent at 03/28/2015  8:38 AM EDT ----- Vit D def.  50,000 units weekly x 12, escribed to pharmacy.  She then should take 1000 units daily (OTC).

## 2015-03-29 NOTE — Telephone Encounter (Signed)
Told pt at the appt.

## 2015-03-29 NOTE — Progress Notes (Signed)
Tolerated iron infusion well. 

## 2015-03-29 NOTE — Patient Instructions (Signed)
[place] at [facility] Discharge Instructions  RECOMMENDATIONS MADE BY THE CONSULTANT AND ANY TEST RESULTS WILL BE SENT TO YOUR REFERRING PHYSICIAN.  Feraheme 510 mg infusion today as ordered. Return as scheduled.  Thank you for choosing [place] at [facility] to provide your oncology and hematology care.  To afford each patient quality time with our provider, please arrive at least 15 minutes before your scheduled appointment time.    You need to re-schedule your appointment should you arrive 10 or more minutes late.  We strive to give you quality time with our providers, and arriving late affects you and other patients whose appointments are after yours.  Also, if you no show three or more times for appointments you may be dismissed from the clinic at the providers discretion.     Again, thank you for choosing [facility].  Our hope is that these requests will decrease the amount of time that you wait before being seen by our physicians.       _____________________________________________________________  Should you have questions after your visit to [facility], please contact our office at [phone] between the hours of 8:30 a.m. and 4:30 p.m.  Voicemails left after 4:30 p.m. will not be returned until the following business day.  For prescription refill requests, have your pharmacy contact our office.

## 2015-04-01 ENCOUNTER — Ambulatory Visit (HOSPITAL_COMMUNITY): Payer: 59

## 2015-04-12 ENCOUNTER — Ambulatory Visit: Payer: 59 | Admitting: Family Medicine

## 2015-04-27 ENCOUNTER — Encounter: Payer: Self-pay | Admitting: Obstetrics and Gynecology

## 2015-05-23 ENCOUNTER — Encounter (HOSPITAL_COMMUNITY): Payer: Self-pay | Admitting: Emergency Medicine

## 2015-05-23 ENCOUNTER — Emergency Department (HOSPITAL_COMMUNITY)
Admission: EM | Admit: 2015-05-23 | Discharge: 2015-05-23 | Disposition: A | Discharge status: Home / Self Care | Payer: 59 | Attending: Emergency Medicine | Admitting: Emergency Medicine

## 2015-05-23 DIAGNOSIS — E669 Obesity, unspecified: Secondary | ICD-10-CM | POA: Diagnosis not present

## 2015-05-23 DIAGNOSIS — F329 Major depressive disorder, single episode, unspecified: Secondary | ICD-10-CM | POA: Diagnosis not present

## 2015-05-23 DIAGNOSIS — K219 Gastro-esophageal reflux disease without esophagitis: Secondary | ICD-10-CM | POA: Diagnosis not present

## 2015-05-23 DIAGNOSIS — L550 Sunburn of first degree: Secondary | ICD-10-CM | POA: Insufficient documentation

## 2015-05-23 DIAGNOSIS — Z862 Personal history of diseases of the blood and blood-forming organs and certain disorders involving the immune mechanism: Secondary | ICD-10-CM | POA: Diagnosis not present

## 2015-05-23 DIAGNOSIS — Z86010 Personal history of colonic polyps: Secondary | ICD-10-CM | POA: Insufficient documentation

## 2015-05-23 DIAGNOSIS — Z8619 Personal history of other infectious and parasitic diseases: Secondary | ICD-10-CM | POA: Insufficient documentation

## 2015-05-23 DIAGNOSIS — R1111 Vomiting without nausea: Secondary | ICD-10-CM | POA: Diagnosis present

## 2015-05-23 DIAGNOSIS — Z79899 Other long term (current) drug therapy: Secondary | ICD-10-CM | POA: Insufficient documentation

## 2015-05-23 DIAGNOSIS — L559 Sunburn, unspecified: Secondary | ICD-10-CM

## 2015-05-23 DIAGNOSIS — E780 Pure hypercholesterolemia: Secondary | ICD-10-CM | POA: Insufficient documentation

## 2015-05-23 DIAGNOSIS — R1013 Epigastric pain: Secondary | ICD-10-CM | POA: Diagnosis not present

## 2015-05-23 DIAGNOSIS — Z853 Personal history of malignant neoplasm of breast: Secondary | ICD-10-CM | POA: Diagnosis not present

## 2015-05-23 DIAGNOSIS — E119 Type 2 diabetes mellitus without complications: Secondary | ICD-10-CM | POA: Insufficient documentation

## 2015-05-23 DIAGNOSIS — I10 Essential (primary) hypertension: Secondary | ICD-10-CM | POA: Insufficient documentation

## 2015-05-23 DIAGNOSIS — F419 Anxiety disorder, unspecified: Secondary | ICD-10-CM | POA: Diagnosis not present

## 2015-05-23 LAB — CBC WITH DIFFERENTIAL/PLATELET
BASOS PCT: 0 % (ref 0–1)
Basophils Absolute: 0 10*3/uL (ref 0.0–0.1)
Eosinophils Absolute: 0.1 10*3/uL (ref 0.0–0.7)
Eosinophils Relative: 2 % (ref 0–5)
HCT: 40.7 % (ref 36.0–46.0)
Hemoglobin: 12.9 g/dL (ref 12.0–15.0)
LYMPHS PCT: 31 % (ref 12–46)
Lymphs Abs: 1.7 10*3/uL (ref 0.7–4.0)
MCH: 28.5 pg (ref 26.0–34.0)
MCHC: 31.7 g/dL (ref 30.0–36.0)
MCV: 89.8 fL (ref 78.0–100.0)
Monocytes Absolute: 0.4 10*3/uL (ref 0.1–1.0)
Monocytes Relative: 8 % (ref 3–12)
NEUTROS ABS: 3.3 10*3/uL (ref 1.7–7.7)
Neutrophils Relative %: 59 % (ref 43–77)
Platelets: 214 10*3/uL (ref 150–400)
RBC: 4.53 MIL/uL (ref 3.87–5.11)
RDW: 16.6 % — ABNORMAL HIGH (ref 11.5–15.5)
WBC: 5.5 10*3/uL (ref 4.0–10.5)

## 2015-05-23 LAB — COMPREHENSIVE METABOLIC PANEL
ALT: 21 U/L (ref 14–54)
AST: 18 U/L (ref 15–41)
Albumin: 3.5 g/dL (ref 3.5–5.0)
Alkaline Phosphatase: 56 U/L (ref 38–126)
Anion gap: 8 (ref 5–15)
BUN: 12 mg/dL (ref 6–20)
CO2: 29 mmol/L (ref 22–32)
CREATININE: 0.88 mg/dL (ref 0.44–1.00)
Calcium: 8.7 mg/dL — ABNORMAL LOW (ref 8.9–10.3)
Chloride: 103 mmol/L (ref 101–111)
GFR calc Af Amer: >60 mL/min (ref >60)
GFR calc non Af Amer: >60 mL/min (ref >60)
Glucose, Bld: 128 mg/dL — ABNORMAL HIGH (ref 65–99)
Potassium: 3.6 mmol/L (ref 3.5–5.1)
Sodium: 140 mmol/L (ref 135–145)
Total Bilirubin: 0.4 mg/dL (ref 0.3–1.2)
Total Protein: 6.9 g/dL (ref 6.5–8.1)

## 2015-05-23 LAB — LIPASE, BLOOD: Lipase: 23 U/L (ref 22–51)

## 2015-05-23 MED ORDER — ONDANSETRON 4 MG PO TBDP: 4.0000 mg | ORAL_TABLET | Freq: Three times a day (TID) | ORAL | Status: DC | PRN

## 2015-05-23 MED ORDER — ONDANSETRON HCL 4 MG/2ML IJ SOLN
4.0000 mg | Freq: Once | INTRAMUSCULAR | Status: AC
  Administered 2015-05-23: 4 mg via INTRAVENOUS
  Filled 2015-05-23: qty 2

## 2015-05-23 MED ORDER — KETOROLAC TROMETHAMINE 30 MG/ML IJ SOLN
30.0000 mg | Freq: Once | INTRAMUSCULAR | Status: AC
  Administered 2015-05-23: 30 mg via INTRAVENOUS
  Filled 2015-05-23: qty 1

## 2015-05-23 MED ORDER — SODIUM CHLORIDE 0.9 % IV BOLUS (SEPSIS)
2000.0000 mL | Freq: Once | INTRAVENOUS | Status: AC
  Administered 2015-05-23: 1000 mL via INTRAVENOUS

## 2015-05-23 NOTE — ED Provider Notes (Signed)
CSN: [id number]     Arrival date & time 05/23/15  1219 History  This chart was scribed for Jill Furry, MD by Evelene Croon, ED Scribe. This patient was seen in room APA18/APA18 and the patient's care was started 1:07 PM.    Chief Complaint  Patient presents with  . Emesis   The history is provided by the patient. No language interpreter was used.     HPI Comments:  Jill MODESITT is a 49 y.o. femaleIDDM who presents to the Emergency Department complaining of nuasea and vomitng since ~0300 yesterday am with multiple episodes since onset.  Her last episode was a few hours PTA. She reports associated upper abdominal pain. Pt notes she spent the entire  day prior to symptom onset outside in the sun and is currently sunburned.  She denies fever and diarrhea. No alleviating factors noted.    Past Medical History  Diagnosis Date  . Depression 06/22/2011  . GERD (gastroesophageal reflux disease) 06/22/2011  . DM (diabetes mellitus) 06/22/2011  . Hypercholesterolemia 06/22/2011  . Hypertension 06/22/2011  . Obesity 06/22/2011  . Hx of adenomatous colonic polyps 10/2007    22mm sigmoid tubular adenoma, FH colon cancer, mother in mid-50s  . Anxiety   . Breast cancer 05/22/2011    03/01/11, Stage 2, s/p lumpectomy, chemo/xrt  . DDD (degenerative disc disease), lumbar   . 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 Vaginal bleeding 03/08/2014  . Shortness of breath   . Blood transfusion without reported diagnosis 1999    due to heavy menses  . Genital warts   . 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 Iron deficiency anemia 03/25/2015   Past Surgical History  Procedure Laterality Date  . Back surg x2    . Cholecystectomy    . Mm breast stereo bx*l*r/s      rt.  . Mastectomy partial / lumpectomy      rt.  . Colonoscopy  11/03/07    4-mm sessile polyp removed/small internal hemorrhoids/tubular adenoma, random colon bx negative for microscopic colitis   . Portacath placement    . Port-a-cath removal  05/26/2012    Procedure: REMOVAL PORT-A-CATH;  Surgeon: Jamesetta So, MD;  Location: [facility];  Service: General;  Laterality: N/A;  Minor Room  . Dilatation & curettage/hysteroscopy with myosure N/A 02/10/2015    Procedure: DILATATION & CURETTAGE/HYSTEROSCOPY WITH MYOSURE;  Surgeon: Nunzio Cobbs, MD;  Location: [place] [facility];  Service: Gynecology;  Laterality: N/A;   Family History  Problem Relation Age of Onset  . Colon cancer Mother     mid-50s, died of metastatic disease  . Cancer Mother     liver  . Coronary artery disease Mother   . Diabetes type I Mother   . Coronary artery disease Father   . Diabetes Father   . Diabetes type I Father   . Hypertension Father   . Bipolar disorder Sister   . Coronary artery disease Brother   . Hypertension Brother   . Hypertension Maternal Grandmother   . Hyperlipidemia Maternal Grandmother   . Stroke Maternal Grandmother   . Hypertension Maternal Grandfather   . Diabetes Maternal Grandfather   . Diabetes Paternal Grandmother   . Heart disease Paternal Grandfather    History  Substance Use Topics  . Smoking status: Never Smoker   . Smokeless tobacco: Never Used  . Alcohol Use: No   OB History  Gravida Para Term Preterm AB TAB SAB Ectopic Multiple Living   4 3 0 0 1 0 0 0 0 3      Review of Systems  Constitutional: Negative for fever and chills.  Gastrointestinal: Positive for nausea, vomiting and abdominal pain. Negative for diarrhea.  All other systems reviewed and are negative.     Allergies  Review of patient's allergies indicates no known allergies.  Home Medications   Prior to Admission medications   Medication Sig Start Date End Date Taking? Authorizing Provider  ALPRAZolam Duanne Moron) 0.5 MG tablet Take 0.5 mg by mouth 5 (five) times daily as needed for anxiety.  08/20/14  Yes Historical Provider, MD  citalopram (CELEXA) 40 MG tablet Take 1 tablet (40 mg total) by  mouth daily. 01/28/15  Yes Mikey Kirschner, MD  lisinopril-hydrochlorothiazide (PRINZIDE,ZESTORETIC) 10-12.5 MG per tablet Take 1 tablet by mouth daily. 01/28/15  Yes Mikey Kirschner, MD  metFORMIN (GLUCOPHAGE) 500 MG tablet Take 1 tablet (500 mg total) by mouth 2 (two) times daily. 01/28/15  Yes Mikey Kirschner, MD  pantoprazole (PROTONIX) 40 MG tablet Take 1 tablet (40 mg total) by mouth daily. For acid reflux 01/28/15  Yes Mikey Kirschner, MD  pravastatin (PRAVACHOL) 40 MG tablet Take 1 tablet (40 mg total) by mouth daily. 01/28/15  Yes Mikey Kirschner, MD  albuterol (PROVENTIL HFA;VENTOLIN HFA) 108 (90 BASE) MCG/ACT inhaler Inhale 2 puffs into the lungs every 4 (four) hours as needed for wheezing. 03/08/14   Mikey Kirschner, MD  ergocalciferol (VITAMIN D2) 50000 UNITS capsule Take 1 capsule (50,000 Units total) by mouth once a week. 03/28/15   Baird Cancer, PA-C  ibuprofen (ADVIL,MOTRIN) 800 MG tablet Take 1 tablet (800 mg total) by mouth every 8 (eight) hours as needed. Patient taking differently: Take 800 mg by mouth every 8 (eight) hours as needed for mild pain.  02/10/15   Brook Oletta Lamas, MD   BP 103/54 mmHg  Pulse 69  Temp(Src) 97.5 F (36.4 C) (Oral)  Resp 16  Ht 5\' 4"  (1.626 m)  Wt 245 lb (111.131 kg)  BMI 42.03 kg/m2  SpO2 97% Physical Exam  Constitutional: She is oriented to person, place, and time. She appears well-developed and well-nourished. No distress.  HENT:  Head: Normocephalic.  Eyes: Conjunctivae are normal. Pupils are equal, round, and reactive to light. No scleral icterus.  Neck: Normal range of motion. Neck supple. No thyromegaly present.  Cardiovascular: Normal rate and regular rhythm.  Exam reveals no gallop and no friction rub.   No murmur heard. Pulmonary/Chest: Effort normal and breath sounds normal. No respiratory distress. She has no wheezes. She has no rales.  Abdominal: Soft. Bowel sounds are normal. She exhibits no distension. There is  tenderness (Minimal epigastric). There is no rebound and no guarding.  Musculoskeletal: Normal range of motion.  Neurological: She is alert and oriented to person, place, and time.  Skin: Skin is warm and dry.  Erythema from anterior chest and shoulders, first degree  Psychiatric: She has a normal mood and affect. Her behavior is normal.    ED Course  Procedures   DIAGNOSTIC STUDIES:  Oxygen Saturation is 99% on RA, normal by my interpretation.    COORDINATION OF CARE:  1:09 PM Discussed treatment plan with pt at bedside and pt agreed to plan.  Labs Review Labs Reviewed  CBC WITH DIFFERENTIAL/PLATELET - Abnormal; Notable for the following:    RDW 16.6 (*)    All  other components within normal limits  COMPREHENSIVE METABOLIC PANEL - Abnormal; Notable for the following:    Glucose, Bld 128 (*)    Calcium 8.7 (*)    All other components within normal limits  LIPASE, BLOOD    Imaging Review No results found.   EKG Interpretation None      MDM   Final diagnoses:  Non-intractable vomiting without nausea, vomiting of unspecified type  Sunburn    I personally performed the services described in this documentation, which was scribed in my presence. The recorded information has been reviewed and is accurate.     Jill Furry, MD 05/23/15 [phone]

## 2015-05-23 NOTE — ED Notes (Signed)
Pt reports she was out in the sun all day Sat. Has sunburn, started vomiting Sat night.

## 2015-05-23 NOTE — Discharge Instructions (Signed)
Nausea and Vomiting Nausea means you feel sick to your stomach. Throwing up (vomiting) is a reflex where stomach contents come out of your mouth. HOME CARE   Take medicine as told by your doctor.  Do not force yourself to eat. However, you do need to drink fluids.  If you feel like eating, eat a normal diet as told by your doctor.  Eat rice, wheat, potatoes, bread, lean meats, yogurt, fruits, and vegetables.  Avoid high-fat foods.  Drink enough fluids to keep your pee (urine) clear or pale yellow.  Ask your doctor how to replace body fluid losses (rehydrate). Signs of body fluid loss (dehydration) include:  Feeling very thirsty.  Dry lips and mouth.  Feeling dizzy.  Dark pee.  Peeing less than normal.  Feeling confused.  Fast breathing or heart rate. GET HELP RIGHT AWAY IF:   You have blood in your throw up.  You have black or bloody poop (stool).  You have a bad headache or stiff neck.  You feel confused.  You have bad belly (abdominal) pain.  You have chest pain or trouble breathing.  You do not pee at least once every 8 hours.  You have cold, clammy skin.  You keep throwing up after 24 to 48 hours.  You have a fever. MAKE SURE YOU:   Understand these instructions.  Will watch your condition.  Will get help right away if you are not doing well or get worse. Document Released: 04/23/2008 Document Revised: 01/28/2012 Document Reviewed: 04/06/2011 [facility] Patient Information 2015 [place], [place]. This information is not intended to replace advice given to you by your health care provider. Make sure you discuss any questions you have with your health care provider.  Sunburn  Sunburn is skin damage from being out in the sun too long. If you have light or fair skin, you may get sunburned more easily. Getting sunburned over and over can cause wrinkles and dark spots on the skin (sun spots). It can also increase your chance of getting skin cancer. HOME  CARE  Avoid being out in the sun until your sunburn is gone.  Take a cool bath to help lessen pain. Put a cold, damp washcloth on the sunburn to help lessen pain. Do not put ice on the sunburn.  Only take medicine as told by your doctor.  Use sunburn creams or gels on your skin but not on blisters.  Drink enough fluids to keep your pee (urine) clear or pale yellow.  Do not break blisters. If blisters break, your doctor may tell you to use a medicated cream on the area. To keep from getting sunburned:  Avoid the sun between 10:00 a.m. and 4:00 p.m. during the day.  Put sunscreen on 30 minutes before being in the sun.  Wear a hat, clothing, and sunglasses to protect against the sun.  Avoid medicines, herbs, and foods that make you more sensitive to sun.  Avoid tanning beds. GET HELP RIGHT AWAY IF:  You have a fever.  You have pain and medicine does not help.  You throw up (vomit) or have watery poop (diarrhea).  You feel like you will pass out (faint).  You have a headache and feel confused.  You have very bad blisters.  You have yellowish-white fluid (pus) coming from your blisters.  Your burn gets more painful and puffy (swollen). MAKE SURE YOU:  Understand these instructions.  Will watch your condition.  Will get help right away if you are not  doing well or get worse. Document Released: 07/18/2011 Document Revised: 03/02/2013 Document Reviewed: 07/18/2011 [facility] Patient Information 2015 [place], [place]. This information is not intended to replace advice given to you by your health care provider. Make sure you discuss any questions you have with your health care provider.

## 2015-05-26 ENCOUNTER — Ambulatory Visit (HOSPITAL_COMMUNITY): Payer: 59 | Admitting: Hematology & Oncology

## 2015-05-31 ENCOUNTER — Emergency Department (HOSPITAL_COMMUNITY): Payer: 59

## 2015-05-31 ENCOUNTER — Encounter (HOSPITAL_COMMUNITY): Payer: Self-pay | Admitting: Emergency Medicine

## 2015-05-31 ENCOUNTER — Emergency Department (HOSPITAL_COMMUNITY)
Admission: EM | Admit: 2015-05-31 | Discharge: 2015-06-01 | Disposition: A | Discharge status: Home / Self Care | Payer: 59 | Attending: Emergency Medicine | Admitting: Emergency Medicine

## 2015-05-31 DIAGNOSIS — E119 Type 2 diabetes mellitus without complications: Secondary | ICD-10-CM | POA: Diagnosis not present

## 2015-05-31 DIAGNOSIS — Z8739 Personal history of other diseases of the musculoskeletal system and connective tissue: Secondary | ICD-10-CM | POA: Insufficient documentation

## 2015-05-31 DIAGNOSIS — Y9389 Activity, other specified: Secondary | ICD-10-CM | POA: Insufficient documentation

## 2015-05-31 DIAGNOSIS — Z862 Personal history of diseases of the blood and blood-forming organs and certain disorders involving the immune mechanism: Secondary | ICD-10-CM | POA: Insufficient documentation

## 2015-05-31 DIAGNOSIS — S50311A Abrasion of right elbow, initial encounter: Secondary | ICD-10-CM | POA: Insufficient documentation

## 2015-05-31 DIAGNOSIS — K219 Gastro-esophageal reflux disease without esophagitis: Secondary | ICD-10-CM | POA: Insufficient documentation

## 2015-05-31 DIAGNOSIS — F419 Anxiety disorder, unspecified: Secondary | ICD-10-CM | POA: Diagnosis not present

## 2015-05-31 DIAGNOSIS — Z8619 Personal history of other infectious and parasitic diseases: Secondary | ICD-10-CM | POA: Diagnosis not present

## 2015-05-31 DIAGNOSIS — Y998 Other external cause status: Secondary | ICD-10-CM | POA: Diagnosis not present

## 2015-05-31 DIAGNOSIS — Z86010 Personal history of colonic polyps: Secondary | ICD-10-CM | POA: Insufficient documentation

## 2015-05-31 DIAGNOSIS — Y9289 Other specified places as the place of occurrence of the external cause: Secondary | ICD-10-CM | POA: Diagnosis not present

## 2015-05-31 DIAGNOSIS — E780 Pure hypercholesterolemia: Secondary | ICD-10-CM | POA: Insufficient documentation

## 2015-05-31 DIAGNOSIS — Z23 Encounter for immunization: Secondary | ICD-10-CM | POA: Insufficient documentation

## 2015-05-31 DIAGNOSIS — E669 Obesity, unspecified: Secondary | ICD-10-CM | POA: Diagnosis not present

## 2015-05-31 DIAGNOSIS — Z853 Personal history of malignant neoplasm of breast: Secondary | ICD-10-CM | POA: Diagnosis not present

## 2015-05-31 DIAGNOSIS — Z79899 Other long term (current) drug therapy: Secondary | ICD-10-CM | POA: Insufficient documentation

## 2015-05-31 DIAGNOSIS — S5001XA Contusion of right elbow, initial encounter: Secondary | ICD-10-CM | POA: Insufficient documentation

## 2015-05-31 DIAGNOSIS — F329 Major depressive disorder, single episode, unspecified: Secondary | ICD-10-CM | POA: Diagnosis not present

## 2015-05-31 DIAGNOSIS — S0990XA Unspecified injury of head, initial encounter: Secondary | ICD-10-CM | POA: Diagnosis present

## 2015-05-31 DIAGNOSIS — I10 Essential (primary) hypertension: Secondary | ICD-10-CM | POA: Diagnosis not present

## 2015-05-31 DIAGNOSIS — S199XXA Unspecified injury of neck, initial encounter: Secondary | ICD-10-CM | POA: Insufficient documentation

## 2015-05-31 DIAGNOSIS — Z8742 Personal history of other diseases of the female genital tract: Secondary | ICD-10-CM | POA: Diagnosis not present

## 2015-05-31 DIAGNOSIS — T148XXA Other injury of unspecified body region, initial encounter: Secondary | ICD-10-CM

## 2015-05-31 MED ORDER — HYDROCODONE-ACETAMINOPHEN 5-325 MG PO TABS
1.0000 | ORAL_TABLET | Freq: Once | ORAL | Status: AC
  Administered 2015-05-31: 1 via ORAL
  Filled 2015-05-31: qty 1

## 2015-05-31 MED ORDER — KETOROLAC TROMETHAMINE 60 MG/2ML IM SOLN
60.0000 mg | Freq: Once | INTRAMUSCULAR | Status: AC
  Administered 2015-05-31: 60 mg via INTRAMUSCULAR
  Filled 2015-05-31: qty 2

## 2015-05-31 NOTE — ED Notes (Signed)
Cleaned the blood of right hand. Abrasion to the palm noted.
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
## 2015-06-01 MED ORDER — TETANUS-DIPHTH-ACELL PERTUSSIS 5-2.5-18.5 LF-MCG/0.5 IM SUSP
0.5000 mL | Freq: Once | INTRAMUSCULAR | Status: AC
  Administered 2015-06-01: 0.5 mL via INTRAMUSCULAR
  Filled 2015-06-01: qty 0.5

## 2015-06-01 MED ORDER — HYDROCODONE-ACETAMINOPHEN 5-325 MG PO TABS: 1.0000 | ORAL_TABLET | Freq: Four times a day (QID) | ORAL | Status: DC | PRN

## 2015-06-01 MED ORDER — IBUPROFEN 600 MG PO TABS: 600.0000 mg | ORAL_TABLET | Freq: Four times a day (QID) | ORAL | Status: DC | PRN

## 2015-06-01 NOTE — ED Notes (Signed)
Pt alert & oriented x4, stable gait. Patient given discharge instructions, paperwork & prescription(s). Patient informed not to drive, operate any equipment & handel any important documents 4 hours after taking pain medication. Patient  instructed to stop at the registration desk to finish any additional paperwork. Patient  verbalized understanding. Pt left department w/ no further questions. 
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
## 2015-06-02 ENCOUNTER — Ambulatory Visit (HOSPITAL_COMMUNITY): Payer: 59 | Admitting: Hematology & Oncology

## 2015-06-05 NOTE — Progress Notes (Signed)
This encounter was created in error - please disregard.

## 2015-06-17 ENCOUNTER — Encounter (HOSPITAL_COMMUNITY): Payer: Self-pay

## 2015-07-04 ENCOUNTER — Other Ambulatory Visit: Payer: Self-pay

## 2015-07-04 ENCOUNTER — Telehealth: Payer: Self-pay | Admitting: Family Medicine

## 2015-07-04 MED ORDER — PANTOPRAZOLE SODIUM 40 MG PO TBEC: 40.0000 mg | DELAYED_RELEASE_TABLET | Freq: Every day | ORAL | Status: DC

## 2015-07-04 MED ORDER — LISINOPRIL-HYDROCHLOROTHIAZIDE 10-12.5 MG PO TABS: 1.0000 | ORAL_TABLET | Freq: Every day | ORAL | Status: DC

## 2015-07-04 NOTE — Telephone Encounter (Signed)
error 

## 2015-07-17 ENCOUNTER — Emergency Department (HOSPITAL_COMMUNITY): Payer: 59

## 2015-07-17 ENCOUNTER — Emergency Department (HOSPITAL_COMMUNITY)
Admission: EM | Admit: 2015-07-17 | Discharge: 2015-07-17 | Disposition: A | Discharge status: Home / Self Care | Payer: 59 | Attending: Emergency Medicine | Admitting: Emergency Medicine

## 2015-07-17 ENCOUNTER — Encounter (HOSPITAL_COMMUNITY): Payer: Self-pay

## 2015-07-17 DIAGNOSIS — Z3202 Encounter for pregnancy test, result negative: Secondary | ICD-10-CM | POA: Diagnosis not present

## 2015-07-17 DIAGNOSIS — K219 Gastro-esophageal reflux disease without esophagitis: Secondary | ICD-10-CM | POA: Diagnosis not present

## 2015-07-17 DIAGNOSIS — Z86018 Personal history of other benign neoplasm: Secondary | ICD-10-CM | POA: Diagnosis not present

## 2015-07-17 DIAGNOSIS — I10 Essential (primary) hypertension: Secondary | ICD-10-CM | POA: Insufficient documentation

## 2015-07-17 DIAGNOSIS — E119 Type 2 diabetes mellitus without complications: Secondary | ICD-10-CM | POA: Diagnosis not present

## 2015-07-17 DIAGNOSIS — R042 Hemoptysis: Secondary | ICD-10-CM | POA: Diagnosis present

## 2015-07-17 DIAGNOSIS — E669 Obesity, unspecified: Secondary | ICD-10-CM | POA: Diagnosis not present

## 2015-07-17 DIAGNOSIS — F329 Major depressive disorder, single episode, unspecified: Secondary | ICD-10-CM | POA: Diagnosis not present

## 2015-07-17 DIAGNOSIS — J209 Acute bronchitis, unspecified: Secondary | ICD-10-CM

## 2015-07-17 DIAGNOSIS — Z8619 Personal history of other infectious and parasitic diseases: Secondary | ICD-10-CM | POA: Diagnosis not present

## 2015-07-17 DIAGNOSIS — E780 Pure hypercholesterolemia: Secondary | ICD-10-CM | POA: Diagnosis not present

## 2015-07-17 DIAGNOSIS — Z8742 Personal history of other diseases of the female genital tract: Secondary | ICD-10-CM | POA: Diagnosis not present

## 2015-07-17 DIAGNOSIS — Z8739 Personal history of other diseases of the musculoskeletal system and connective tissue: Secondary | ICD-10-CM | POA: Insufficient documentation

## 2015-07-17 DIAGNOSIS — R0982 Postnasal drip: Secondary | ICD-10-CM | POA: Diagnosis not present

## 2015-07-17 DIAGNOSIS — F419 Anxiety disorder, unspecified: Secondary | ICD-10-CM | POA: Insufficient documentation

## 2015-07-17 DIAGNOSIS — R0981 Nasal congestion: Secondary | ICD-10-CM | POA: Insufficient documentation

## 2015-07-17 DIAGNOSIS — J3489 Other specified disorders of nose and nasal sinuses: Secondary | ICD-10-CM | POA: Diagnosis not present

## 2015-07-17 DIAGNOSIS — Z862 Personal history of diseases of the blood and blood-forming organs and certain disorders involving the immune mechanism: Secondary | ICD-10-CM | POA: Insufficient documentation

## 2015-07-17 DIAGNOSIS — Z79899 Other long term (current) drug therapy: Secondary | ICD-10-CM | POA: Insufficient documentation

## 2015-07-17 DIAGNOSIS — Z853 Personal history of malignant neoplasm of breast: Secondary | ICD-10-CM | POA: Insufficient documentation

## 2015-07-17 LAB — POC URINE PREG, ED: Preg Test, Ur: NEGATIVE

## 2015-07-17 MED ORDER — ALBUTEROL SULFATE HFA 108 (90 BASE) MCG/ACT IN AERS
INHALATION_SPRAY | RESPIRATORY_TRACT | Status: AC
  Filled 2015-07-17: qty 6.7

## 2015-07-17 MED ORDER — BENZONATATE 100 MG PO CAPS: 200.0000 mg | ORAL_CAPSULE | Freq: Three times a day (TID) | ORAL | Status: DC | PRN

## 2015-07-17 MED ORDER — BENZONATATE 100 MG PO CAPS
200.0000 mg | ORAL_CAPSULE | Freq: Once | ORAL | Status: AC
  Administered 2015-07-17: 200 mg via ORAL
  Filled 2015-07-17: qty 2

## 2015-07-17 MED ORDER — ALBUTEROL SULFATE HFA 108 (90 BASE) MCG/ACT IN AERS
1.0000 | INHALATION_SPRAY | RESPIRATORY_TRACT | Status: DC
  Administered 2015-07-17: 2 via RESPIRATORY_TRACT

## 2015-07-17 MED ORDER — ONDANSETRON 8 MG PO TBDP
8.0000 mg | ORAL_TABLET | Freq: Once | ORAL | Status: AC
Start: 2015-07-17 — End: 2015-07-17
  Administered 2015-07-17: 8 mg via ORAL
  Filled 2015-07-17: qty 1

## 2015-07-17 MED ORDER — ALBUTEROL SULFATE HFA 108 (90 BASE) MCG/ACT IN AERS: 1.0000 | INHALATION_SPRAY | Freq: Four times a day (QID) | RESPIRATORY_TRACT | Status: DC | PRN

## 2015-07-17 NOTE — ED Provider Notes (Signed)
Patient seen/examined in the Emergency Department in conjunction with [facility] Provider [facility] Patient reports cough/congestion, denies hemoptysis and CP with cough and bending forward Exam : awake/alert, no distress, no crackles noted Plan: CXR/EKG.  If negative stable for d/c home I have low suspicion for Acute PE/ACS at this time    Ripley Fraise, MD 07/17/15 1123

## 2015-07-17 NOTE — ED Notes (Signed)
Pt complain of cough and congestion that started Monday. States she has a history of bronchitis

## 2015-07-17 NOTE — ED Provider Notes (Signed)
CSN: [id number]     Arrival date & time 07/17/15  1015 History   First MD Initiated Contact with Patient 07/17/15 1032     Chief Complaint  Patient presents with  . Hemoptysis     (Consider location/radiation/quality/duration/timing/severity/associated sxs/prior Treatment) The history is provided by the patient.   [name] is a 49 y.o. female with a history of DM, Gerd, HTN and as completed breast cancer surgery and treatment presenting with a 6 day h/o cough productive yellow sputum along with shortness of breath, subjective fever, nausea without emesis and intermittent episodes of left upper chest pain.  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  The pain was sharp and lasted for abut 45 seconds, then resolved. She has had several episodes of similar pain all lasting less the 45 seconds.  She also endorses nasal congestion, post nasal drip and mild sore throat.  She has used cough lozenges without relief.  Despite noted chief complaint, pt denies hemoptysis.  Cough has been yellow thick sputum.    Past Medical History  Diagnosis Date  . Depression 06/22/2011  . GERD (gastroesophageal reflux disease) 06/22/2011  . DM (diabetes mellitus) 06/22/2011  . Hypercholesterolemia 06/22/2011  . Hypertension 06/22/2011  . Obesity 06/22/2011  . Hx of adenomatous colonic polyps 10/2007    76mm sigmoid tubular adenoma, FH colon cancer, mother in mid-50s  . Anxiety   . Breast cancer 05/22/2011    03/01/11, Stage 2, s/p lumpectomy, chemo/xrt  . DDD (degenerative disc disease), lumbar   . 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 Vaginal bleeding 03/08/2014  . Shortness of breath   . Blood transfusion without reported diagnosis 1999    due to heavy menses  . Genital warts   . 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 Iron deficiency anemia 03/25/2015   Past  Surgical History  Procedure Laterality Date  . Back surg x2    . Cholecystectomy    . Mm breast stereo bx*l*r/s      rt.  . Mastectomy partial / lumpectomy      rt.  . Colonoscopy  11/03/07    4-mm sessile polyp removed/small internal hemorrhoids/tubular adenoma, random colon bx negative for microscopic colitis  . Portacath placement    . Port-a-cath removal  05/26/2012    Procedure: REMOVAL PORT-A-CATH;  Surgeon: Jamesetta So, MD;  Location: [facility];  Service: General;  Laterality: N/A;  Minor Room  . Dilatation & curettage/hysteroscopy with myosure N/A 02/10/2015    Procedure: DILATATION & CURETTAGE/HYSTEROSCOPY WITH MYOSURE;  Surgeon: Nunzio Cobbs, MD;  Location: [facility];  Service: Gynecology;  Laterality: N/A;   Family History  Problem Relation Age of Onset  . Colon cancer Mother     mid-50s, died of metastatic disease  . Cancer Mother     liver  . Coronary artery disease Mother   . Diabetes type I Mother   . Coronary artery disease Father   . Diabetes Father   . Diabetes type I Father   . Hypertension Father   . Bipolar disorder Sister   . Coronary artery disease Brother   . Hypertension Brother   . Hypertension Maternal Grandmother   . Hyperlipidemia Maternal Grandmother   . Stroke Maternal Grandmother   . Hypertension Maternal Grandfather   . Diabetes Maternal Grandfather   .  Diabetes Paternal Grandmother   . Heart disease Paternal Grandfather    Social History  Substance Use Topics  . Smoking status: Never Smoker   . Smokeless tobacco: Never Used  . Alcohol Use: No   OB History    Gravida Para Term Preterm AB TAB SAB Ectopic Multiple Living   4 3 0 0 1 0 0 0 0 3      Review of Systems  Constitutional: Positive for fever and chills.  HENT: Positive for congestion, postnasal drip, rhinorrhea and sore throat. Negative for ear pain, sinus pressure, trouble swallowing and voice change.   Eyes: Negative for discharge.  Respiratory: Positive for cough.  Negative for shortness of breath, wheezing and stridor.   Cardiovascular: Positive for chest pain.  Gastrointestinal: Negative for abdominal pain.  Genitourinary: Negative.       Allergies  Review of patient's allergies indicates no known allergies.  Home Medications   Prior to Admission medications   Medication Sig Start Date End Date Taking? Authorizing Provider  albuterol (PROVENTIL HFA;VENTOLIN HFA) 108 (90 BASE) MCG/ACT inhaler Inhale 2 puffs into the lungs every 4 (four) hours as needed for wheezing. 03/08/14  Yes Mikey Kirschner, MD  ALPRAZolam Duanne Moron) 0.5 MG tablet Take 0.5 mg by mouth 5 (five) times daily as needed for anxiety.  08/20/14  Yes Historical Provider, MD  citalopram (CELEXA) 40 MG tablet Take 1 tablet (40 mg total) by mouth daily. 01/28/15  Yes Mikey Kirschner, MD  lisinopril-hydrochlorothiazide (PRINZIDE,ZESTORETIC) 10-12.5 MG per tablet Take 1 tablet by mouth daily. 07/04/15  Yes Mikey Kirschner, MD  metFORMIN (GLUCOPHAGE) 500 MG tablet Take 1 tablet (500 mg total) by mouth 2 (two) times daily. 01/28/15  Yes Mikey Kirschner, MD  pantoprazole (PROTONIX) 40 MG tablet Take 1 tablet (40 mg total) by mouth daily. For acid reflux 07/04/15  Yes Mikey Kirschner, MD  pravastatin (PRAVACHOL) 40 MG tablet Take 1 tablet (40 mg total) by mouth daily. 01/28/15  Yes Mikey Kirschner, MD  benzonatate (TESSALON) 100 MG capsule Take 2 capsules (200 mg total) by mouth 3 (three) times daily as needed. 07/17/15   Evalee Jefferson, PA-C  ergocalciferol (VITAMIN D2) 50000 UNITS capsule Take 1 capsule (50,000 Units total) by mouth once a week. Patient not taking: Reported on 07/17/2015 03/28/15   Baird Cancer, PA-C  HYDROcodone-acetaminophen (NORCO/VICODIN) 5-325 MG per tablet Take 1 tablet by mouth every 6 (six) hours as needed for moderate pain. Patient not taking: Reported on 07/17/2015 06/01/15   Merryl Hacker, MD  ibuprofen (ADVIL,MOTRIN) 600 MG tablet Take 1 tablet (600 mg total) by mouth  every 6 (six) hours as needed. Patient not taking: Reported on 07/17/2015 06/01/15   Merryl Hacker, MD  ondansetron (ZOFRAN ODT) 4 MG disintegrating tablet Take 1 tablet (4 mg total) by mouth every 8 (eight) hours as needed for nausea. Patient not taking: Reported on 07/17/2015 05/23/15   Tanna Furry, MD   BP 118/74 mmHg  Pulse 74  Temp(Src) 98.4 F (36.9 C) (Oral)  Resp 18  Ht 5\' 4"  (1.626 m)  Wt 240 lb (108.863 kg)  BMI 41.18 kg/m2  SpO2 96% Physical Exam  Constitutional: She is oriented to person, place, and time. She appears well-developed and well-nourished.  HENT:  Head: Normocephalic and atraumatic.  Right Ear: Tympanic membrane and ear canal normal.  Left Ear: Tympanic membrane and ear canal normal.  Nose: Mucosal edema and rhinorrhea present.  Mouth/Throat: Uvula is midline, oropharynx is clear  and moist and mucous membranes are normal. No oropharyngeal exudate, posterior oropharyngeal edema, posterior oropharyngeal erythema or tonsillar abscesses.  Eyes: Conjunctivae are normal.  Cardiovascular: Normal rate and normal heart sounds.   Pulmonary/Chest: Effort normal. No respiratory distress. She has no wheezes. She has no rales. She exhibits tenderness.    Reproducible chest wall pain left upper chest wall.   Abdominal: Soft. There is no tenderness.  Musculoskeletal: Normal range of motion.  Neurological: She is alert and oriented to person, place, and time.  Skin: Skin is warm and dry. No rash noted.  Psychiatric: She has a normal mood and affect.    ED Course  Procedures (including critical care time) Labs Review Labs Reviewed - No data to display  Imaging Review Dg Chest 2 View  07/17/2015   CLINICAL DATA:  Cough, chest pain.  EXAM: CHEST  2 VIEW  COMPARISON:  08/06/2014  FINDINGS: The heart size and mediastinal contours are within normal limits. Both lungs are clear. The visualized skeletal structures are unremarkable.  IMPRESSION: No active cardiopulmonary  disease.   Electronically Signed   By: Rolm Baptise M.D.   On: 07/17/2015 11:42   I have personally reviewed and evaluated these images and lab results as part of my medical decision-making.   EKG Interpretation   Date/Time:  Sunday July 17 2015 10:36:12 EDT Ventricular Rate:  71 PR Interval:  163 QRS Duration: 79 QT Interval:  431 QTC Calculation: 468 R Axis:   48 Text Interpretation:  Sinus rhythm Anteroseptal infarct, old Baseline  wander in lead(s) V2 V6 No significant change since last tracing artifact  noted Confirmed by Christy Gentles  MD, DONALD ([id number]) on 07/17/2015 11:16:10 AM      MDM   Final diagnoses:  Acute bronchitis, unspecified organism    Pt with sx c/w viral bronchitis.  She was given tessalon for cough relief, advised to use her albuterol mdi 2 puffs q 4 hours for cough or sob.  Rest, increased fluid intake, f/u with pcp if sx persist or worsen.    Evalee Jefferson, PA-C 07/17/15 1159  Ripley Fraise, MD 07/17/15 [phone]

## 2015-07-17 NOTE — Discharge Instructions (Signed)

## 2015-07-17 NOTE — ED Notes (Signed)
POC preg negative.

## 2015-08-02 ENCOUNTER — Ambulatory Visit (INDEPENDENT_AMBULATORY_CARE_PROVIDER_SITE_OTHER): Payer: 59 | Admitting: Family Medicine

## 2015-08-02 ENCOUNTER — Encounter: Payer: Self-pay | Admitting: Family Medicine

## 2015-08-02 VITALS — BP 138/88 | Temp 98.9°F | Ht 64.0 in | Wt 248.0 lb

## 2015-08-02 DIAGNOSIS — Z79899 Other long term (current) drug therapy: Secondary | ICD-10-CM

## 2015-08-02 DIAGNOSIS — I10 Essential (primary) hypertension: Secondary | ICD-10-CM

## 2015-08-02 DIAGNOSIS — Z23 Encounter for immunization: Secondary | ICD-10-CM

## 2015-08-02 DIAGNOSIS — E559 Vitamin D deficiency, unspecified: Secondary | ICD-10-CM

## 2015-08-02 DIAGNOSIS — D509 Iron deficiency anemia, unspecified: Secondary | ICD-10-CM | POA: Diagnosis not present

## 2015-08-02 DIAGNOSIS — J201 Acute bronchitis due to Hemophilus influenzae: Secondary | ICD-10-CM

## 2015-08-02 DIAGNOSIS — E119 Type 2 diabetes mellitus without complications: Secondary | ICD-10-CM | POA: Diagnosis not present

## 2015-08-02 DIAGNOSIS — E785 Hyperlipidemia, unspecified: Secondary | ICD-10-CM

## 2015-08-02 MED ORDER — CEFPROZIL 500 MG PO TABS: 500.0000 mg | ORAL_TABLET | Freq: Two times a day (BID) | ORAL | Status: DC

## 2015-08-02 NOTE — Progress Notes (Signed)
   Subjective:    Patient ID: CATRICE ZULETA, female    DOB: Feb 20, 1966, 49 y.o.   MRN: [id number]  HPIhaving nausea and weakness. Pt states she was diagnosed with bronchitis 2 weeks ago. They did chest xray and ekg. Prescribed inhaler and tessalon for cough.   Still having cough, chest pain, and shortness of breath.   Cancer dr took her off of cholesterol med bc of muscle aches. She was taking pravastatin. Not taking anything now. Stopped over one month.   patient has diabetes best I can tell she is not had this checkup in a while she states her sugars 7 looking good she denies any real high sugars. She states her compliance with her medications as good.    she relates since coming off cholesterol medicine the achiness went away she did take Crestor in the past and it seemed to do good  she states blood pressures been decent under good control.   Review of Systems  Constitutional: Negative for fever and activity change.  HENT: Positive for congestion and rhinorrhea. Negative for ear pain.   Eyes: Negative for discharge.  Respiratory: Positive for cough. Negative for shortness of breath and wheezing.   Cardiovascular: Negative for chest pain.       Objective:   Physical Exam  Constitutional: She appears well-developed.  HENT:  Head: Normocephalic.  Nose: Nose normal.  Mouth/Throat: Oropharynx is clear and moist. No oropharyngeal exudate.  Neck: Neck supple.  Cardiovascular: Normal rate and normal heart sounds.   No murmur heard. Pulmonary/Chest: Effort normal and breath sounds normal. She has no wheezes.  Lymphadenopathy:    She has no cervical adenopathy.  Skin: Skin is warm and dry.  Nursing note and vitals reviewed.         Assessment & Plan:  1. Acute bronchitis due to Haemophilus influenzae  this is been going on for a couple weeks at this point I believe it's reasonable to put her on antibiotics. Warning signs discussed follow-up of problems  2. Vitamin D  deficiency  patient with vitamin D deficiency I do recommend checking the level. She has taken supplementation - Vit D  25 hydroxy (rtn osteoporosis monitoring)  3. Type 2 diabetes mellitus without complication  due for L8X watch diet stay physically active - Hemoglobin A1c  4. Iron deficiency anemia  due for a ferritin has had on infusions in the past - Ferritin  5. Hyperlipemia  cholesterol profile recommended may need to start generic Lipitor -did not start Crestor because of insurance and pravastatin did not tolerate - Lipid panel  6. Essential hypertension  blood pressure good control check lab work - Medical laboratory scientific officer  7. High risk medication use  lab work ordered - Hepatic function panel  8. Encounter for immunization  flu shot today

## 2015-08-03 LAB — BASIC METABOLIC PANEL
BUN/Creatinine Ratio: 16 (ref 9–23)
BUN: 15 mg/dL (ref 6–24)
CO2: 29 mmol/L (ref 18–29)
Calcium: 9.8 mg/dL (ref 8.7–10.2)
Chloride: 96 mmol/L — ABNORMAL LOW (ref 97–108)
Creatinine, Ser: 0.94 mg/dL (ref 0.57–1.00)
GFR calc Af Amer: 82 mL/min/{1.73_m2} (ref >59)
GFR, EST NON AFRICAN AMERICAN: 71 mL/min/{1.73_m2} (ref >59)
Glucose: 100 mg/dL — ABNORMAL HIGH (ref 65–99)
Potassium: 4.2 mmol/L (ref 3.5–5.2)
Sodium: 141 mmol/L (ref 134–144)

## 2015-08-03 LAB — HEMOGLOBIN A1C
Est. average glucose Bld gHb Est-mCnc: 131 mg/dL
HEMOGLOBIN A1C: 6.2 % — AB (ref 4.8–5.6)

## 2015-08-03 LAB — HEPATIC FUNCTION PANEL
ALT: 20 IU/L (ref 0–32)
AST: 19 IU/L (ref 0–40)
Albumin: 4.4 g/dL (ref 3.5–5.5)
Alkaline Phosphatase: 59 IU/L (ref 39–117)
Bilirubin Total: 0.3 mg/dL (ref 0.0–1.2)
Bilirubin, Direct: 0.09 mg/dL (ref 0.00–0.40)
Total Protein: 7.7 g/dL (ref 6.0–8.5)

## 2015-08-03 LAB — LIPID PANEL
Chol/HDL Ratio: 4.9 ratio units — ABNORMAL HIGH (ref 0.0–4.4)
Cholesterol, Total: 221 mg/dL — ABNORMAL HIGH (ref 100–199)
HDL: 45 mg/dL (ref >39)
LDL Calculated: 110 mg/dL — ABNORMAL HIGH (ref 0–99)
TRIGLYCERIDES: 332 mg/dL — AB (ref 0–149)
VLDL Cholesterol Cal: 66 mg/dL — ABNORMAL HIGH (ref 5–40)

## 2015-08-03 LAB — FERRITIN: Ferritin: 124 ng/mL (ref 15–150)

## 2015-08-03 LAB — VITAMIN D 25 HYDROXY (VIT D DEFICIENCY, FRACTURES): Vit D, 25-Hydroxy: 21.1 ng/mL — ABNORMAL LOW (ref 30.0–100.0)

## 2015-08-05 ENCOUNTER — Other Ambulatory Visit: Payer: Self-pay | Admitting: Family Medicine

## 2015-08-08 MED ORDER — ATORVASTATIN CALCIUM 10 MG PO TABS: 10.0000 mg | ORAL_TABLET | Freq: Every day | ORAL | Status: DC

## 2015-08-08 NOTE — Addendum Note (Signed)
Addended by: Launa Grill on: 08/08/2015 09:57 AM   Modules accepted: Orders

## 2015-08-19 ENCOUNTER — Ambulatory Visit (HOSPITAL_COMMUNITY): Payer: 59 | Admitting: Hematology & Oncology

## 2015-08-21 NOTE — Progress Notes (Signed)
This encounter was created in error - please disregard.

## 2015-09-29 ENCOUNTER — Ambulatory Visit: Payer: 59 | Admitting: Obstetrics and Gynecology

## 2015-10-31 ENCOUNTER — Other Ambulatory Visit: Payer: Self-pay | Admitting: Family Medicine

## 2015-11-07 ENCOUNTER — Emergency Department (HOSPITAL_COMMUNITY)
Admission: EM | Admit: 2015-11-07 | Discharge: 2015-11-07 | Disposition: A | Discharge status: Home / Self Care | Payer: 59 | Attending: Emergency Medicine | Admitting: Emergency Medicine

## 2015-11-07 ENCOUNTER — Encounter (HOSPITAL_COMMUNITY): Payer: Self-pay | Admitting: Emergency Medicine

## 2015-11-07 DIAGNOSIS — E7800 Pure hypercholesterolemia, unspecified: Secondary | ICD-10-CM | POA: Insufficient documentation

## 2015-11-07 DIAGNOSIS — Z853 Personal history of malignant neoplasm of breast: Secondary | ICD-10-CM | POA: Insufficient documentation

## 2015-11-07 DIAGNOSIS — Z86010 Personal history of colonic polyps: Secondary | ICD-10-CM | POA: Insufficient documentation

## 2015-11-07 DIAGNOSIS — F329 Major depressive disorder, single episode, unspecified: Secondary | ICD-10-CM | POA: Diagnosis not present

## 2015-11-07 DIAGNOSIS — F419 Anxiety disorder, unspecified: Secondary | ICD-10-CM | POA: Diagnosis not present

## 2015-11-07 DIAGNOSIS — Z3202 Encounter for pregnancy test, result negative: Secondary | ICD-10-CM | POA: Insufficient documentation

## 2015-11-07 DIAGNOSIS — R112 Nausea with vomiting, unspecified: Secondary | ICD-10-CM

## 2015-11-07 DIAGNOSIS — K219 Gastro-esophageal reflux disease without esophagitis: Secondary | ICD-10-CM | POA: Diagnosis not present

## 2015-11-07 DIAGNOSIS — Z862 Personal history of diseases of the blood and blood-forming organs and certain disorders involving the immune mechanism: Secondary | ICD-10-CM | POA: Diagnosis not present

## 2015-11-07 DIAGNOSIS — Z8742 Personal history of other diseases of the female genital tract: Secondary | ICD-10-CM | POA: Diagnosis not present

## 2015-11-07 DIAGNOSIS — Z8619 Personal history of other infectious and parasitic diseases: Secondary | ICD-10-CM | POA: Diagnosis not present

## 2015-11-07 DIAGNOSIS — I10 Essential (primary) hypertension: Secondary | ICD-10-CM | POA: Diagnosis not present

## 2015-11-07 DIAGNOSIS — E119 Type 2 diabetes mellitus without complications: Secondary | ICD-10-CM | POA: Insufficient documentation

## 2015-11-07 LAB — URINE MICROSCOPIC-ADD ON

## 2015-11-07 LAB — URINALYSIS, ROUTINE W REFLEX MICROSCOPIC
Bilirubin Urine: NEGATIVE
Glucose, UA: NEGATIVE mg/dL
Ketones, ur: NEGATIVE mg/dL
Leukocytes, UA: NEGATIVE
Nitrite: NEGATIVE
Protein, ur: NEGATIVE mg/dL
Specific Gravity, Urine: 1.025 (ref 1.005–1.030)
pH: 5.5 (ref 5.0–8.0)

## 2015-11-07 LAB — CBC WITH DIFFERENTIAL/PLATELET
Basophils Absolute: 0 10*3/uL (ref 0.0–0.1)
Basophils Relative: 0 %
EOS PCT: 1 %
Eosinophils Absolute: 0.1 10*3/uL (ref 0.0–0.7)
HCT: 40.9 % (ref 36.0–46.0)
Hemoglobin: 13.3 g/dL (ref 12.0–15.0)
Lymphocytes Relative: 20 %
Lymphs Abs: 1.7 10*3/uL (ref 0.7–4.0)
MCH: 29.7 pg (ref 26.0–34.0)
MCHC: 32.5 g/dL (ref 30.0–36.0)
MCV: 91.3 fL (ref 78.0–100.0)
Monocytes Absolute: 0.4 10*3/uL (ref 0.1–1.0)
Monocytes Relative: 5 %
Neutro Abs: 6.4 10*3/uL (ref 1.7–7.7)
Neutrophils Relative %: 74 %
Platelets: 221 10*3/uL (ref 150–400)
RBC: 4.48 MIL/uL (ref 3.87–5.11)
RDW: 15.2 % (ref 11.5–15.5)
WBC: 8.6 10*3/uL (ref 4.0–10.5)

## 2015-11-07 LAB — COMPREHENSIVE METABOLIC PANEL
ALT: 19 U/L (ref 14–54)
ANION GAP: 13 (ref 5–15)
AST: 24 U/L (ref 15–41)
Albumin: 3.9 g/dL (ref 3.5–5.0)
Alkaline Phosphatase: 50 U/L (ref 38–126)
BUN: 12 mg/dL (ref 6–20)
CO2: 26 mmol/L (ref 22–32)
CREATININE: 0.99 mg/dL (ref 0.44–1.00)
Calcium: 9 mg/dL (ref 8.9–10.3)
Chloride: 99 mmol/L — ABNORMAL LOW (ref 101–111)
Glucose, Bld: 124 mg/dL — ABNORMAL HIGH (ref 65–99)
Potassium: 3.5 mmol/L (ref 3.5–5.1)
Sodium: 138 mmol/L (ref 135–145)
Total Bilirubin: 0.5 mg/dL (ref 0.3–1.2)
Total Protein: 7.8 g/dL (ref 6.5–8.1)

## 2015-11-07 LAB — PREGNANCY, URINE: PREG TEST UR: NEGATIVE

## 2015-11-07 LAB — LIPASE, BLOOD: Lipase: 37 U/L (ref 11–51)

## 2015-11-07 MED ORDER — ONDANSETRON 4 MG PO TBDP: 4.0000 mg | ORAL_TABLET | Freq: Three times a day (TID) | ORAL | Status: DC | PRN

## 2015-11-07 MED ORDER — SODIUM CHLORIDE 0.9 % IV BOLUS (SEPSIS)
1000.0000 mL | Freq: Once | INTRAVENOUS | Status: AC
  Administered 2015-11-07: 1000 mL via INTRAVENOUS

## 2015-11-07 MED ORDER — SODIUM CHLORIDE 0.9 % IV BOLUS (SEPSIS)
1000.0000 mL | Freq: Once | INTRAVENOUS | Status: AC
Start: 2015-11-07 — End: 2015-11-07
  Administered 2015-11-07: 1000 mL via INTRAVENOUS

## 2015-11-07 MED ORDER — ONDANSETRON HCL 4 MG/2ML IJ SOLN
4.0000 mg | Freq: Once | INTRAMUSCULAR | Status: AC
  Administered 2015-11-07: 4 mg via INTRAVENOUS
  Filled 2015-11-07: qty 2

## 2015-11-07 NOTE — ED Provider Notes (Signed)
CSN: [id number]     Arrival date & time 11/07/15  1313 History   First MD Initiated Contact with Patient 11/07/15 1324     Chief Complaint  Patient presents with  . 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 No f/c/cough/sob or urinary sx.   Has had chole in the past Sx are persistsent, can't eat without vomiting. No meds pta.  Patient is a 49 y.o. female presenting with vomiting. The history is provided by the patient.  Emesis   Past Medical History  Diagnosis Date  . Depression 06/22/2011  . GERD (gastroesophageal reflux disease) 06/22/2011  . DM (diabetes mellitus) ([place]) 06/22/2011  . Hypercholesterolemia 06/22/2011  . Hypertension 06/22/2011  . Obesity 06/22/2011  . Hx of adenomatous colonic polyps 10/2007    70mm sigmoid tubular adenoma, FH colon cancer, mother in mid-50s  . Anxiety   . Breast cancer ([place]) 05/22/2011    03/01/11, Stage 2, s/p lumpectomy, chemo/xrt  . DDD (degenerative disc disease), lumbar   . 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[place]) 05/22/2011  . Vaginal bleeding 03/08/2014  . Shortness of breath   . Blood transfusion without reported diagnosis 1999    due to heavy menses  . Genital warts   . 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 Iron deficiency anemia 03/25/2015   Past Surgical History  Procedure Laterality Date  . Back surg x2    . Cholecystectomy    . Mm breast stereo bx*l*r/s      rt.  . Mastectomy partial / lumpectomy      rt.  . Colonoscopy  11/03/07    4-mm sessile polyp removed/small internal hemorrhoids/tubular adenoma, random colon bx negative for microscopic colitis  . Portacath  placement    . Port-a-cath removal  05/26/2012    Procedure: REMOVAL PORT-A-CATH;  Surgeon: Jamesetta So, MD;  Location: [facility];  Service: General;  Laterality: N/A;  Minor Room  . Dilatation & curettage/hysteroscopy with myosure N/A 02/10/2015    Procedure: DILATATION & CURETTAGE/HYSTEROSCOPY WITH MYOSURE;  Surgeon: Nunzio Cobbs, MD;  Location: [facility];  Service: Gynecology;  Laterality: N/A;   Family History  Problem Relation Age of Onset  . Colon cancer Mother     mid-50s, died of metastatic disease  . Cancer Mother     liver  . Coronary artery disease Mother   . Diabetes type I Mother   . Coronary artery disease Father   . Diabetes Father   . Diabetes type I Father   . Hypertension Father   . Bipolar disorder Sister   . Coronary artery disease Brother   . Hypertension Brother   . Hypertension Maternal Grandmother   . Hyperlipidemia Maternal Grandmother   . Stroke Maternal Grandmother   . Hypertension Maternal Grandfather   . Diabetes Maternal Grandfather   . Diabetes Paternal Grandmother   . Heart disease Paternal Grandfather    Social History  Substance Use Topics  . Smoking status: Never Smoker   . Smokeless tobacco: Never Used  . Alcohol Use: No   OB History    Gravida Para Term Preterm AB  TAB SAB Ectopic Multiple Living   4 3 0 0 1 0 0 0 0 3      Review of Systems  Gastrointestinal: Positive for vomiting.  All other systems reviewed and are negative.     Allergies  Pravastatin  Home Medications   Prior to Admission medications   Medication Sig Start Date End Date Taking? Authorizing Provider  albuterol (PROVENTIL HFA;VENTOLIN HFA) 108 (90 BASE) MCG/ACT inhaler Inhale 1-2 puffs into the lungs every 6 (six) hours as needed for wheezing or shortness of breath. 07/17/15  Yes Evalee Jefferson, PA-C  ALPRAZolam Duanne Moron) 0.5 MG tablet Take 0.5 mg by mouth 5 (five) times daily as needed for anxiety.  08/20/14  Yes Historical Provider, MD  atorvastatin (LIPITOR)  10 MG tablet Take 1 tablet (10 mg total) by mouth daily. 08/08/15  Yes Kathyrn Drown, MD  citalopram (CELEXA) 40 MG tablet Take 1 tablet (40 mg total) by mouth daily. 01/28/15  Yes Mikey Kirschner, MD  ibuprofen (ADVIL,MOTRIN) 600 MG tablet Take 600 mg by mouth every 6 (six) hours as needed for headache.   Yes Historical Provider, MD  lisinopril-hydrochlorothiazide (PRINZIDE,ZESTORETIC) 10-12.5 MG per tablet Take 1 tablet by mouth daily. 07/04/15  Yes Mikey Kirschner, MD  metFORMIN (GLUCOPHAGE) 500 MG tablet TAKE ONE TABLET BY MOUTH TWICE DAILY 08/08/15  Yes Kathyrn Drown, MD  pantoprazole (PROTONIX) 40 MG tablet Take 1 tablet (40 mg total) by mouth daily. For acid reflux 07/04/15  Yes Mikey Kirschner, MD  ondansetron (ZOFRAN ODT) 4 MG disintegrating tablet Take 1 tablet (4 mg total) by mouth every 8 (eight) hours as needed for nausea. 11/07/15   Noemi Chapel, MD   BP 116/62 mmHg  Pulse 89  Temp(Src) 98.4 F (36.9 C) (Oral)  Resp 14  Ht 5\' 4"  (1.626 m)  Wt 245 lb (111.131 kg)  BMI 42.03 kg/m2  SpO2 96%  LMP 09/07/2015 Physical Exam  Constitutional: She appears well-developed and well-nourished. No distress.  HENT:  Head: Normocephalic and atraumatic.  Mouth/Throat: Oropharynx is clear and moist. No oropharyngeal exudate.  Eyes: Conjunctivae and EOM are normal. Pupils are equal, round, and reactive to light. Right eye exhibits no discharge. Left eye exhibits no discharge. No scleral icterus.  Neck: Normal range of motion. Neck supple. No JVD present. No thyromegaly present.  Cardiovascular: Normal rate, regular rhythm, normal heart sounds and intact distal pulses.  Exam reveals no gallop and no friction rub.   No murmur heard. Pulmonary/Chest: Effort normal and breath sounds normal. No respiratory distress. She has no wheezes. She has no rales.  Abdominal: Soft. Bowel sounds are normal. She exhibits no distension and no mass. There is no tenderness.  mobese - no ttp over abd but has  nausea with abd palpation  Musculoskeletal: Normal range of motion. She exhibits no edema or tenderness.  Lymphadenopathy:    She has no cervical adenopathy.  Neurological: She is alert. Coordination normal.  Skin: Skin is warm and dry. No rash noted. No erythema.  Psychiatric: She has a normal mood and affect. Her behavior is normal.  Nursing note and vitals reviewed.   ED Course  Procedures (including critical care time) Labs Review Labs Reviewed  URINALYSIS, ROUTINE W REFLEX MICROSCOPIC (NOT AT [facility]) - Abnormal; Notable for the following:    Hgb urine dipstick TRACE (*)    All other components within normal limits  COMPREHENSIVE METABOLIC PANEL - Abnormal; Notable for the following:    Chloride 99 (*)  Glucose, Bld 124 (*)    All other components within normal limits  URINE MICROSCOPIC-ADD ON - Abnormal; Notable for the following:    Squamous Epithelial / LPF TOO NUMEROUS TO COUNT (*)    Bacteria, UA FEW (*)    All other components within normal limits  PREGNANCY, URINE  CBC WITH DIFFERENTIAL/PLATELET  LIPASE, BLOOD    Imaging Review No results found. I have personally reviewed and evaluated these images and lab results as part of my medical decision-making.    MDM   Final diagnoses:  Nausea and vomiting in adult    VS normal - the pt has no fever or signs of sepsis - she has n/v/ and some loose stools - r/o pregnancy, UTI, possible PUD, pancreatitis though less likely - fluids, meds.  UA neg Not preg Labs normal Improved with m eds Stable for d/c.  Meds given in ED:  Medications  sodium chloride 0.9 % bolus 1,000 mL (1,000 mLs Intravenous New Bag/Given 11/07/15 1351)  sodium chloride 0.9 % bolus 1,000 mL (0 mLs Intravenous Stopped 11/07/15 1458)  ondansetron (ZOFRAN) injection 4 mg (4 mg Intravenous Given 11/07/15 1352)    New Prescriptions   ONDANSETRON (ZOFRAN ODT) 4 MG DISINTEGRATING TABLET    Take 1 tablet (4 mg total) by mouth every 8 (eight)  hours as needed for nausea.      Noemi Chapel, MD 11/07/15 [phone]

## 2015-11-07 NOTE — ED Notes (Signed)
PT c/o nausea/vomiting since yesterday evening and some loose stools. PT stated she tried to go to work today but c/o generalized weakness and soreness to abdominal area. PT reports vomiting x4 today. PT denies any urinary symptoms.

## 2015-11-07 NOTE — Discharge Instructions (Signed)

## 2015-11-07 NOTE — ED Notes (Signed)
Pt resting, oxygen decreased to 89%, pt placed on 2 L of oxygen. Increase saturation to 94%

## 2015-12-12 ENCOUNTER — Ambulatory Visit (HOSPITAL_COMMUNITY)
Admission: RE | Admit: 2015-12-12 | Discharge: 2015-12-12 | Disposition: A | Discharge status: Home / Self Care | Payer: BLUE CROSS/BLUE SHIELD | Source: Ambulatory Visit | Attending: Family Medicine | Admitting: Family Medicine

## 2015-12-12 ENCOUNTER — Encounter: Payer: Self-pay | Admitting: Family Medicine

## 2015-12-12 ENCOUNTER — Ambulatory Visit (INDEPENDENT_AMBULATORY_CARE_PROVIDER_SITE_OTHER): Payer: BLUE CROSS/BLUE SHIELD | Admitting: Family Medicine

## 2015-12-12 VITALS — BP 112/80 | Temp 100.3°F | Ht 64.0 in | Wt 242.0 lb

## 2015-12-12 DIAGNOSIS — I10 Essential (primary) hypertension: Secondary | ICD-10-CM

## 2015-12-12 DIAGNOSIS — M25562 Pain in left knee: Secondary | ICD-10-CM

## 2015-12-12 DIAGNOSIS — J329 Chronic sinusitis, unspecified: Secondary | ICD-10-CM | POA: Diagnosis not present

## 2015-12-12 DIAGNOSIS — J310 Chronic rhinitis: Secondary | ICD-10-CM

## 2015-12-12 MED ORDER — AMOXICILLIN 500 MG PO CAPS: 500.0000 mg | ORAL_CAPSULE | Freq: Three times a day (TID) | ORAL | Status: DC

## 2015-12-12 MED ORDER — HYDROCODONE-ACETAMINOPHEN 5-325 MG PO TABS: 1.0000 | ORAL_TABLET | Freq: Every evening | ORAL | Status: DC | PRN

## 2015-12-12 MED ORDER — ETODOLAC 400 MG PO TABS: 400.0000 mg | ORAL_TABLET | Freq: Two times a day (BID) | ORAL | Status: DC

## 2015-12-12 NOTE — Progress Notes (Signed)
   Subjective:    Patient ID: [name], female    DOB: 12-27-1965, 50 y.o.   MRN: [id number]  Fall The accident occurred more than 1 week ago. The fall occurred while standing. Point of impact: left leg  The pain is present in the left lower leg and left upper leg. The pain is moderate. The symptoms are aggravated by movement. She has tried NSAID for the symptoms. The treatment provided no relief.   Patient also has a cough and fever.  , productive at times. Low-grade fever the last several days. On further history is had some congestion actually for several weeks. Next  Compliant with blood pressure medication. No obvious side effects. Does not miss a dose. Meds reviewed today. Blood pressure generally good when checked elsewhere  [address] thru a hole in the floor, leg swelled up and bruised,   Some swellinf  Behind the leg swellingng , no ankle swelling. Pain with walking. Unable to work. Review of Systems  no chest pain no shortness breath no abdominal pain    Objective:   Physical Exam  alert vital stable HEENT moderate nasal congestion pharynx normal lungs clear heart rare rhythm left knee contusion evident no obvious effusion obvious joint line tenderness positive anterior patellar tenderness and posterior knee fullness       Assessment & Plan:   impression 1 rhinosinusitis/bronchitis discussed possible flu element also. #2 left knee contusion doubt serious etiology although pain now persistent with a limp. Severity of 1 into The patient unable to work today #3 hypertension good control meds discussed maintain same plan x-ray knee. Antibiotics prescribed. Anti-inflammatory medicine prescribed. Hydrocodone daily at bedtime local measures discussed maintain same dose of blood pressure meds work excuse written recheck her persists WSL

## 2015-12-14 ENCOUNTER — Ambulatory Visit (INDEPENDENT_AMBULATORY_CARE_PROVIDER_SITE_OTHER): Payer: BLUE CROSS/BLUE SHIELD | Admitting: Family Medicine

## 2015-12-14 ENCOUNTER — Encounter: Payer: Self-pay | Admitting: Family Medicine

## 2015-12-14 ENCOUNTER — Telehealth: Payer: Self-pay | Admitting: Family Medicine

## 2015-12-14 VITALS — BP 120/76 | Temp 98.4°F | Ht 64.0 in | Wt 241.5 lb

## 2015-12-14 DIAGNOSIS — N912 Amenorrhea, unspecified: Secondary | ICD-10-CM

## 2015-12-14 DIAGNOSIS — J329 Chronic sinusitis, unspecified: Secondary | ICD-10-CM | POA: Diagnosis not present

## 2015-12-14 DIAGNOSIS — R112 Nausea with vomiting, unspecified: Secondary | ICD-10-CM

## 2015-12-14 DIAGNOSIS — R739 Hyperglycemia, unspecified: Secondary | ICD-10-CM | POA: Diagnosis not present

## 2015-12-14 DIAGNOSIS — A084 Viral intestinal infection, unspecified: Secondary | ICD-10-CM

## 2015-12-14 LAB — POCT GLUCOSE (DEVICE FOR HOME USE): POC Glucose: 89 mg/dl (ref 70–99)

## 2015-12-14 LAB — POCT URINE PREGNANCY: Preg Test, Ur: NEGATIVE

## 2015-12-14 MED ORDER — PROMETHAZINE HCL 25 MG/ML IJ SOLN: 25.0000 mg | Freq: Once | INTRAMUSCULAR | Status: DC

## 2015-12-14 MED ORDER — PROMETHAZINE HCL 25 MG/ML IJ SOLN
25.0000 mg | Freq: Once | INTRAMUSCULAR | Status: AC
  Administered 2015-12-14: 25 mg via INTRAMUSCULAR

## 2015-12-14 MED ORDER — ONDANSETRON 8 MG PO TBDP: 8.0000 mg | ORAL_TABLET | Freq: Three times a day (TID) | ORAL | Status: DC | PRN

## 2015-12-14 NOTE — Progress Notes (Signed)
   Subjective:    Patient ID: [name], female    DOB: 11-Mar-1966, 50 y.o.   MRN: [id number]  Emesis  This is a new problem. The current episode started in the past 7 days. The maximum temperature recorded prior to her arrival was 100.4 - 100.9 F. Associated symptoms include coughing.   Patient with c/o of vomiting. Patient recently seen for fall and was prescribed Lodine and Amoxicillin. Patient currently still taking.  Patient also has concerns of possible pregnancy. Patient states she missed her cycle she isn't certain if she may be pregnant or not denies pelvic pain   Review of Systems  Respiratory: Positive for cough.   Gastrointestinal: Positive for vomiting.   relate some coughing denies wheezing difficulty breathing denies diarrhea did have some nausea some runny nose.     Objective:   Physical Exam  mucous membranes moist throat is normal eardrums normal neck supple lungs clear heart regular abdomen soft       Assessment & Plan:   viral syndrome Gastroenteritis  Phenergan shot Zofran when necessary If not tolerating liquids by morning time go to ER  no need for lab work Diabetes hold off on metformin for the next few days   possible mild flulike illness  Should gradually get better if worse follow-up here or ER

## 2015-12-14 NOTE — Telephone Encounter (Signed)
Recheck in office today per dr Nicki Reaper

## 2015-12-14 NOTE — Telephone Encounter (Signed)
Pt notified and transferred up front to schedule ov today.

## 2015-12-14 NOTE — Telephone Encounter (Signed)
Seen 1/23 diagnosed with rhinosinusitis/bronchitis and touch of flu.  Prescribed amoxil. Has fever. Not sure of temp. Just feels hot. Then has chills. Vomiting non stop last night. None today but having nausea. Drinking gingerale. Feels weak.

## 2015-12-14 NOTE — Telephone Encounter (Signed)
Pt called stating that she still has a fever and cant stop vomiting. Pt wants to know if this is normal or if she needs different medication. Please advise.

## 2015-12-15 ENCOUNTER — Encounter (HOSPITAL_COMMUNITY): Payer: Self-pay | Admitting: *Deleted

## 2015-12-15 ENCOUNTER — Emergency Department (HOSPITAL_COMMUNITY): Payer: BLUE CROSS/BLUE SHIELD

## 2015-12-15 ENCOUNTER — Observation Stay (HOSPITAL_COMMUNITY)
Admission: EM | Admit: 2015-12-15 | Discharge: 2015-12-16 | Disposition: A | Discharge status: Home / Self Care | Payer: BLUE CROSS/BLUE SHIELD | Attending: Internal Medicine | Admitting: Internal Medicine

## 2015-12-15 DIAGNOSIS — Z8619 Personal history of other infectious and parasitic diseases: Secondary | ICD-10-CM | POA: Diagnosis not present

## 2015-12-15 DIAGNOSIS — J3489 Other specified disorders of nose and nasal sinuses: Secondary | ICD-10-CM | POA: Diagnosis not present

## 2015-12-15 DIAGNOSIS — M5136 Other intervertebral disc degeneration, lumbar region: Secondary | ICD-10-CM | POA: Diagnosis not present

## 2015-12-15 DIAGNOSIS — E7800 Pure hypercholesterolemia, unspecified: Secondary | ICD-10-CM | POA: Insufficient documentation

## 2015-12-15 DIAGNOSIS — E860 Dehydration: Secondary | ICD-10-CM | POA: Insufficient documentation

## 2015-12-15 DIAGNOSIS — I951 Orthostatic hypotension: Secondary | ICD-10-CM

## 2015-12-15 DIAGNOSIS — Z86010 Personal history of colonic polyps: Secondary | ICD-10-CM | POA: Insufficient documentation

## 2015-12-15 DIAGNOSIS — R1310 Dysphagia, unspecified: Secondary | ICD-10-CM | POA: Diagnosis present

## 2015-12-15 DIAGNOSIS — F419 Anxiety disorder, unspecified: Secondary | ICD-10-CM | POA: Diagnosis not present

## 2015-12-15 DIAGNOSIS — Z853 Personal history of malignant neoplasm of breast: Secondary | ICD-10-CM | POA: Diagnosis not present

## 2015-12-15 DIAGNOSIS — K529 Noninfective gastroenteritis and colitis, unspecified: Secondary | ICD-10-CM | POA: Diagnosis not present

## 2015-12-15 DIAGNOSIS — K219 Gastro-esophageal reflux disease without esophagitis: Secondary | ICD-10-CM | POA: Diagnosis not present

## 2015-12-15 DIAGNOSIS — E119 Type 2 diabetes mellitus without complications: Secondary | ICD-10-CM | POA: Insufficient documentation

## 2015-12-15 DIAGNOSIS — R112 Nausea with vomiting, unspecified: Secondary | ICD-10-CM | POA: Diagnosis present

## 2015-12-15 DIAGNOSIS — F329 Major depressive disorder, single episode, unspecified: Secondary | ICD-10-CM | POA: Diagnosis not present

## 2015-12-15 DIAGNOSIS — Z79899 Other long term (current) drug therapy: Secondary | ICD-10-CM | POA: Insufficient documentation

## 2015-12-15 DIAGNOSIS — R197 Diarrhea, unspecified: Secondary | ICD-10-CM

## 2015-12-15 DIAGNOSIS — I10 Essential (primary) hypertension: Secondary | ICD-10-CM | POA: Diagnosis present

## 2015-12-15 DIAGNOSIS — R42 Dizziness and giddiness: Secondary | ICD-10-CM | POA: Diagnosis not present

## 2015-12-15 DIAGNOSIS — R1319 Other dysphagia: Secondary | ICD-10-CM | POA: Diagnosis present

## 2015-12-15 DIAGNOSIS — E1165 Type 2 diabetes mellitus with hyperglycemia: Secondary | ICD-10-CM

## 2015-12-15 LAB — I-STAT TROPONIN, ED: TROPONIN I, POC: 0 ng/mL (ref 0.00–0.08)

## 2015-12-15 LAB — INFLUENZA PANEL BY PCR (TYPE A & B)
H1N1FLUPCR: NOT DETECTED
Influenza A By PCR: NEGATIVE
Influenza B By PCR: NEGATIVE

## 2015-12-15 LAB — COMPREHENSIVE METABOLIC PANEL
ALBUMIN: 3.5 g/dL (ref 3.5–5.0)
ALK PHOS: 43 U/L (ref 38–126)
ALT: 22 U/L (ref 14–54)
AST: 23 U/L (ref 15–41)
Anion gap: 9 (ref 5–15)
BUN: 11 mg/dL (ref 6–20)
CO2: 31 mmol/L (ref 22–32)
Calcium: 9 mg/dL (ref 8.9–10.3)
Chloride: 102 mmol/L (ref 101–111)
Creatinine, Ser: 1.04 mg/dL — ABNORMAL HIGH (ref 0.44–1.00)
GFR calc Af Amer: >60 mL/min (ref >60)
GFR calc non Af Amer: >60 mL/min (ref >60)
Glucose, Bld: 122 mg/dL — ABNORMAL HIGH (ref 65–99)
POTASSIUM: 3.3 mmol/L — AB (ref 3.5–5.1)
Sodium: 142 mmol/L (ref 135–145)
Total Bilirubin: 0.5 mg/dL (ref 0.3–1.2)
Total Protein: 7.3 g/dL (ref 6.5–8.1)

## 2015-12-15 LAB — LIPASE, BLOOD: Lipase: 25 U/L (ref 11–51)

## 2015-12-15 LAB — URINALYSIS, ROUTINE W REFLEX MICROSCOPIC
Glucose, UA: NEGATIVE mg/dL
Hgb urine dipstick: NEGATIVE
Ketones, ur: NEGATIVE mg/dL
Leukocytes, UA: NEGATIVE
Nitrite: NEGATIVE
Protein, ur: NEGATIVE mg/dL
Specific Gravity, Urine: 1.02 (ref 1.005–1.030)
pH: 5.5 (ref 5.0–8.0)

## 2015-12-15 LAB — CBC
HCT: 39 % (ref 36.0–46.0)
Hemoglobin: 12.2 g/dL (ref 12.0–15.0)
MCH: 28.9 pg (ref 26.0–34.0)
MCHC: 31.3 g/dL (ref 30.0–36.0)
MCV: 92.4 fL (ref 78.0–100.0)
Platelets: 213 K/uL (ref 150–400)
RBC: 4.22 MIL/uL (ref 3.87–5.11)
RDW: 15.6 % — ABNORMAL HIGH (ref 11.5–15.5)
WBC: 4.8 K/uL (ref 4.0–10.5)

## 2015-12-15 LAB — MAGNESIUM: Magnesium: 2.1 mg/dL (ref 1.7–2.4)

## 2015-12-15 MED ORDER — ONDANSETRON HCL 4 MG/2ML IJ SOLN
4.0000 mg | INTRAMUSCULAR | Status: DC | PRN
  Administered 2015-12-15: 4 mg via INTRAVENOUS

## 2015-12-15 MED ORDER — ALBUTEROL SULFATE (2.5 MG/3ML) 0.083% IN NEBU: 3.0000 mL | INHALATION_SOLUTION | Freq: Four times a day (QID) | RESPIRATORY_TRACT | Status: DC | PRN

## 2015-12-15 MED ORDER — SODIUM CHLORIDE 0.9 % IV BOLUS (SEPSIS)
1000.0000 mL | Freq: Once | INTRAVENOUS | Status: AC
  Administered 2015-12-15: 1000 mL via INTRAVENOUS

## 2015-12-15 MED ORDER — POTASSIUM CHLORIDE CRYS ER 20 MEQ PO TBCR
40.0000 meq | EXTENDED_RELEASE_TABLET | Freq: Once | ORAL | Status: AC
  Administered 2015-12-15: 40 meq via ORAL
  Filled 2015-12-15: qty 2

## 2015-12-15 MED ORDER — PANTOPRAZOLE SODIUM 40 MG PO TBEC
40.0000 mg | DELAYED_RELEASE_TABLET | Freq: Every day | ORAL | Status: DC
  Administered 2015-12-16: 40 mg via ORAL
  Filled 2015-12-15: qty 1

## 2015-12-15 MED ORDER — AMOXICILLIN 250 MG PO CAPS
500.0000 mg | ORAL_CAPSULE | Freq: Three times a day (TID) | ORAL | Status: DC
  Administered 2015-12-15 – 2015-12-16 (×2): 500 mg via ORAL
  Filled 2015-12-15 (×2): qty 2

## 2015-12-15 MED ORDER — PROMETHAZINE HCL 12.5 MG PO TABS
12.5000 mg | ORAL_TABLET | Freq: Four times a day (QID) | ORAL | Status: DC | PRN
  Administered 2015-12-15 – 2015-12-16 (×2): 12.5 mg via ORAL
  Filled 2015-12-15 (×2): qty 1

## 2015-12-15 MED ORDER — ONDANSETRON 8 MG PO TBDP
ORAL_TABLET | ORAL | Status: AC
  Filled 2015-12-15: qty 1

## 2015-12-15 MED ORDER — ONDANSETRON HCL 4 MG/2ML IJ SOLN
INTRAMUSCULAR | Status: AC
  Filled 2015-12-15: qty 2

## 2015-12-15 MED ORDER — CITALOPRAM HYDROBROMIDE 20 MG PO TABS
40.0000 mg | ORAL_TABLET | Freq: Every day | ORAL | Status: DC
  Administered 2015-12-16: 40 mg via ORAL
  Filled 2015-12-15: qty 2

## 2015-12-15 MED ORDER — ONDANSETRON HCL 4 MG/2ML IJ SOLN
4.0000 mg | Freq: Once | INTRAMUSCULAR | Status: AC
  Administered 2015-12-15: 4 mg via INTRAMUSCULAR

## 2015-12-15 MED ORDER — INSULIN ASPART 100 UNIT/ML ~~LOC~~ SOLN: 0.0000 [IU] | Freq: Three times a day (TID) | SUBCUTANEOUS | Status: DC

## 2015-12-15 MED ORDER — ONDANSETRON HCL 4 MG/2ML IJ SOLN
4.0000 mg | Freq: Three times a day (TID) | INTRAMUSCULAR | Status: AC | PRN
  Administered 2015-12-16: 4 mg via INTRAVENOUS
  Filled 2015-12-15: qty 2

## 2015-12-15 MED ORDER — ONDANSETRON 4 MG PO TBDP
4.0000 mg | ORAL_TABLET | Freq: Once | ORAL | Status: DC
Start: 2015-12-15 — End: 2015-12-15

## 2015-12-15 MED ORDER — SODIUM CHLORIDE 0.9 % IV SOLN
INTRAVENOUS | Status: DC
Start: 2015-12-15 — End: 2015-12-15

## 2015-12-15 MED ORDER — ALPRAZOLAM 0.5 MG PO TABS: 0.5000 mg | ORAL_TABLET | Freq: Every day | ORAL | Status: DC | PRN

## 2015-12-15 MED ORDER — POTASSIUM CHLORIDE IN NACL 20-0.9 MEQ/L-% IV SOLN
INTRAVENOUS | Status: AC
  Administered 2015-12-15: 23:00:00 via INTRAVENOUS

## 2015-12-15 MED ORDER — ONDANSETRON 8 MG PO TBDP
8.0000 mg | ORAL_TABLET | Freq: Once | ORAL | Status: AC
  Administered 2015-12-15: 8 mg via ORAL

## 2015-12-15 MED ORDER — SODIUM CHLORIDE 0.9 % IV SOLN: INTRAVENOUS | Status: DC

## 2015-12-15 MED ORDER — HYDROCODONE-ACETAMINOPHEN 5-325 MG PO TABS
1.0000 | ORAL_TABLET | Freq: Four times a day (QID) | ORAL | Status: DC | PRN
  Administered 2015-12-15: 1 via ORAL
  Filled 2015-12-15: qty 1

## 2015-12-15 MED ORDER — ALUM & MAG HYDROXIDE-SIMETH 200-200-20 MG/5ML PO SUSP: 30.0000 mL | Freq: Four times a day (QID) | ORAL | Status: DC | PRN

## 2015-12-15 MED ORDER — ETODOLAC 400 MG PO TABS
400.0000 mg | ORAL_TABLET | Freq: Two times a day (BID) | ORAL | Status: DC | PRN
  Filled 2015-12-15: qty 1

## 2015-12-15 NOTE — ED Provider Notes (Signed)
CSN: [id number]     Arrival date & time 12/15/15  1313 History   First MD Initiated Contact with Patient 12/15/15 1421     Chief Complaint  Patient presents with  . Emesis      HPI Pt was seen at 1430.  Per pt, c/o gradual onset and persistence of constant runny/stuffy nose, sinus congestion, and cough for the past 3 days. Has been associated with multiple intermittent episodes of N/V/D and home fevers to "101."  States she has been evaluated by her PMD x2 for same, and told she "had a virus." States she was told by her PMD to come to the ED today "because I might be dehydrated from the vomiting." Denies rash, no CP/SOB, no abd pain, no back pain, no sore throat, no dysuria.    Past Medical History  Diagnosis Date  . Depression 06/22/2011  . GERD (gastroesophageal reflux disease) 06/22/2011  . DM (diabetes mellitus) ([place]) 06/22/2011  . Hypercholesterolemia 06/22/2011  . Hypertension 06/22/2011  . Obesity 06/22/2011  . Hx of adenomatous colonic polyps 10/2007    74mm sigmoid tubular adenoma, FH colon cancer, mother in mid-50s  . Anxiety   . Breast cancer ([place]) 05/22/2011    03/01/11, Stage 2, s/p lumpectomy, chemo/xrt  . DDD (degenerative disc disease), lumbar   . 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[place]) 05/22/2011  . Vaginal bleeding 03/08/2014  . Shortness of breath   . Blood transfusion without reported diagnosis 1999    due to heavy menses  . Genital warts   . 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 Iron deficiency anemia 03/25/2015   Past Surgical History  Procedure Laterality Date  . Back surg x2    . Cholecystectomy    . Mm breast stereo bx*l*r/s      rt.  . Mastectomy partial / lumpectomy      rt.  . Colonoscopy  11/03/07    4-mm sessile polyp removed/small internal hemorrhoids/tubular adenoma, random colon bx negative for microscopic colitis  . Portacath placement    . Port-a-cath removal  05/26/2012    Procedure: REMOVAL PORT-A-CATH;   Surgeon: Jamesetta So, MD;  Location: [facility];  Service: General;  Laterality: N/A;  Minor Room  . Dilatation & curettage/hysteroscopy with myosure N/A 02/10/2015    Procedure: DILATATION & CURETTAGE/HYSTEROSCOPY WITH MYOSURE;  Surgeon: Nunzio Cobbs, MD;  Location: [facility];  Service: Gynecology;  Laterality: N/A;   Family History  Problem Relation Age of Onset  . Colon cancer Mother     mid-50s, died of metastatic disease  . Cancer Mother     liver  . Coronary artery disease Mother   . Diabetes type I Mother   . Coronary artery disease Father   . Diabetes Father   . Diabetes type I Father   . Hypertension Father   . Bipolar disorder Sister   . Coronary artery disease Brother   . Hypertension Brother   . Hypertension Maternal Grandmother   . Hyperlipidemia Maternal Grandmother   . Stroke Maternal Grandmother   . Hypertension Maternal Grandfather   . Diabetes Maternal Grandfather   . Diabetes Paternal Grandmother   . Heart disease Paternal Grandfather    Social History  Substance Use Topics  . Smoking status: Never Smoker   . Smokeless tobacco: Never Used  . Alcohol Use: No   OB History    Gravida Para Term Preterm AB TAB SAB  Ectopic Multiple Living   4 3 0 0 1 0 0 0 0 3      Review of Systems ROS: Statement: All systems negative except as marked or noted in the HPI; Constitutional: +fever and chills, generalized body aches/fatigue. ; ; Eyes: Negative for eye pain, redness and discharge. ; ; ENMT: Negative for ear pain, hoarseness, sore throat. +nasal congestion, +sinus pressure. ; ; Cardiovascular: Negative for chest pain, palpitations, diaphoresis, dyspnea and peripheral edema. ; ; Respiratory: +cough. Negative for wheezing and stridor. ; ; Gastrointestinal: +N/V/D. Negative for abdominal pain, blood in stool, hematemesis, jaundice and rectal bleeding. . ; ; Genitourinary: Negative for dysuria, flank pain and hematuria. ; ; Musculoskeletal: Negative for back pain and  neck pain. Negative for swelling and trauma.; ; Skin: Negative for pruritus, rash, abrasions, blisters, bruising and skin lesion.; ; Neuro: Negative for headache, lightheadedness and neck stiffness. Negative for weakness, altered level of consciousness , altered mental status, extremity weakness, paresthesias, involuntary movement, seizure and syncope.       Allergies  Pravastatin  Home Medications   Prior to Admission medications   Medication Sig Start Date End Date Taking? Authorizing Provider  albuterol (PROVENTIL HFA;VENTOLIN HFA) 108 (90 BASE) MCG/ACT inhaler Inhale 1-2 puffs into the lungs every 6 (six) hours as needed for wheezing or shortness of breath. 07/17/15  Yes Evalee Jefferson, PA-C  ALPRAZolam Duanne Moron) 0.5 MG tablet Take 0.5 mg by mouth 5 (five) times daily as needed for anxiety.  08/20/14  Yes Historical Provider, MD  amoxicillin (AMOXIL) 500 MG capsule Take 1 capsule (500 mg total) by mouth 3 (three) times daily. For 10 days 12/12/15  Yes Mikey Kirschner, MD  citalopram (CELEXA) 40 MG tablet Take 1 tablet (40 mg total) by mouth daily. 01/28/15  Yes Mikey Kirschner, MD  etodolac (LODINE) 400 MG tablet Take 1 tablet (400 mg total) by mouth 2 (two) times daily. Prn pain with food 12/12/15  Yes Mikey Kirschner, MD  HYDROcodone-acetaminophen (NORCO/VICODIN) 5-325 MG tablet Take 1 tablet by mouth at bedtime as needed. 12/12/15  Yes Mikey Kirschner, MD  lisinopril-hydrochlorothiazide (PRINZIDE,ZESTORETIC) 10-12.5 MG per tablet Take 1 tablet by mouth daily. 07/04/15  Yes Mikey Kirschner, MD  metFORMIN (GLUCOPHAGE) 500 MG tablet TAKE ONE TABLET BY MOUTH TWICE DAILY 08/08/15  Yes Kathyrn Drown, MD  ondansetron (ZOFRAN ODT) 8 MG disintegrating tablet Take 1 tablet (8 mg total) by mouth every 8 (eight) hours as needed for nausea or vomiting. 12/14/15  Yes Kathyrn Drown, MD  pantoprazole (PROTONIX) 40 MG tablet Take 1 tablet (40 mg total) by mouth daily. For acid reflux 07/04/15  Yes Mikey Kirschner, MD  atorvastatin (LIPITOR) 10 MG tablet Take 1 tablet (10 mg total) by mouth daily. Patient not taking: Reported on 12/15/2015 08/08/15   Kathyrn Drown, MD   BP 106/59 mmHg  Pulse 99  Temp(Src) 99.2 F (37.3 C) (Oral)  Resp 18  Ht 5\' 4"  (1.626 m)  Wt 241 lb (109.317 kg)  BMI 41.35 kg/m2  SpO2 97%    15:16:54 Orthostatic Vital Signs JC  Orthostatic Lying  - BP- Lying: 95/60 mmHg ; Pulse- Lying: 86  Orthostatic Sitting - BP- Sitting: 99/66 mmHg ; Pulse- Sitting: 96  Orthostatic Standing at 0 minutes - BP- Standing at 0 minutes:  (unable to get standing blood pressure. pt c/o feeling dizzy. ) ; Pulse- Standing at 0 minutes: 96     BP 111/81 mmHg  Pulse 79  Temp(Src) 98.5 F (36.9 C) (Oral)  Resp 18  Ht 5\' 4"  (1.626 m)  Wt 241 lb (109.317 kg)  BMI 41.35 kg/m2  SpO2 96% BP 106/72 mmHg  Pulse 77  Temp(Src) 98.2 F (36.8 C) (Oral)  Resp 18  Ht 5\' 4"  (1.626 m)  Wt 241 lb (109.317 kg)  BMI 41.35 kg/m2  SpO2 100%   Physical Exam  1435: Physical examination:  Nursing notes reviewed; Vital signs and O2 SAT reviewed;  Constitutional: Well developed, Well nourished, In no acute distress; Head:  Normocephalic, atraumatic; Eyes: EOMI, PERRL, No scleral icterus; ENMT: TM's clear bilat. +edemetous nasal turbinates bilat with clear rhinorrhea. Mouth and pharynx without lesions. No tonsillar exudates. No intra-oral edema. No submandibular or sublingual edema. No hoarse voice, no drooling, no stridor. No pain with manipulation of larynx. No trismus. Mouth and pharynx normal, Mucous membranes dry; Neck: Supple, Full range of motion, No lymphadenopathy. No meningeal signs; Cardiovascular: Regular rate and rhythm, No murmur, rub, or gallop; Respiratory: Breath sounds clear & equal bilaterally, No rales, rhonchi, wheezes.  Speaking full sentences with ease, Normal respiratory effort/excursion; Chest: Nontender, Movement normal; Abdomen: Soft, Nontender, Nondistended, Normal bowel sounds;  Genitourinary: No CVA tenderness; Extremities: Pulses normal, No tenderness, No edema, No calf edema or asymmetry.; Neuro: AA&Ox3, Major CN grossly intact.  Speech clear. No gross focal motor or sensory deficits in extremities.; Skin: Color normal, Warm, Dry.   ED Course  Procedures (including critical care time) Labs Review  Imaging Review  I have personally reviewed and evaluated these images and lab results as part of my medical decision-making.   EKG Interpretation   Date/Time:  Thursday December 15 2015 19:48:53 EST Ventricular Rate:  82 PR Interval:  157 QRS Duration: 84 QT Interval:  394 QTC Calculation: 460 R Axis:   36 Text Interpretation:  Sinus rhythm low voltage, precordial leads No  significant change since last tracing Confirmed by KNAPP  MD-J, JON  [id number]) on 12/15/2015 7:58:57 PM      MDM  MDM Reviewed: previous chart, nursing note and vitals Reviewed previous: labs Interpretation: labs and x-ray      Results for orders placed or performed during the hospital encounter of 12/15/15  Lipase, blood  Result Value Ref Range   Lipase 25 11 - 51 U/L  Comprehensive metabolic panel  Result Value Ref Range   Sodium 142 135 - 145 mmol/L   Potassium 3.3 (L) 3.5 - 5.1 mmol/L   Chloride 102 101 - 111 mmol/L   CO2 31 22 - 32 mmol/L   Glucose, Bld 122 (H) 65 - 99 mg/dL   BUN 11 6 - 20 mg/dL   Creatinine, Ser 1.04 (H) 0.44 - 1.00 mg/dL   Calcium 9.0 8.9 - 10.3 mg/dL   Total Protein 7.3 6.5 - 8.1 g/dL   Albumin 3.5 3.5 - 5.0 g/dL   AST 23 15 - 41 U/L   ALT 22 14 - 54 U/L   Alkaline Phosphatase 43 38 - 126 U/L   Total Bilirubin 0.5 0.3 - 1.2 mg/dL   GFR calc non Af Amer >60 >60 mL/min   GFR calc Af Amer >60 >60 mL/min   Anion gap 9 5 - 15  CBC  Result Value Ref Range   WBC 4.8 4.0 - 10.5 K/uL   RBC 4.22 3.87 - 5.11 MIL/uL   Hemoglobin 12.2 12.0 - 15.0 g/dL   HCT 39.0 36.0 - 46.0 %   MCV 92.4 78.0 - 100.0 fL  MCH 28.9 26.0 - 34.0 pg   MCHC 31.3 30.0 -  36.0 g/dL   RDW 15.6 (H) 11.5 - 15.5 %   Platelets 213 150 - 400 K/uL  Urinalysis, Routine w reflex microscopic (not at [facility])  Result Value Ref Range   Color, Urine YELLOW YELLOW   APPearance CLEAR CLEAR   Specific Gravity, Urine 1.020 1.005 - 1.030   pH 5.5 5.0 - 8.0   Glucose, UA NEGATIVE NEGATIVE mg/dL   Hgb urine dipstick NEGATIVE NEGATIVE   Bilirubin Urine SMALL (A) NEGATIVE   Ketones, ur NEGATIVE NEGATIVE mg/dL   Protein, ur NEGATIVE NEGATIVE mg/dL   Nitrite NEGATIVE NEGATIVE   Leukocytes, UA NEGATIVE NEGATIVE  I-stat troponin, ED  Result Value Ref Range   Troponin i, poc 0.00 0.00 - 0.08 ng/mL   Comment 3           Dg Chest 2 View 12/15/2015  CLINICAL DATA:  Three day history of left upper chest pain, cough, and fever. Current history of diabetes and hypertension. EXAM: CHEST  2 VIEW COMPARISON:  8/20 06/1015 and earlier. FINDINGS: Cardiac silhouette normal in size, unchanged. Hilar and mediastinal contours unremarkable. Lungs clear. Bronchovascular markings normal. Pulmonary vascularity normal. No visible pleural effusions. No pneumothorax. Mild degenerative changes involving the thoracic spine and slight thoracic scoliosis convex right, unchanged. IMPRESSION: No acute cardiopulmonary disease.  Stable examination. Electronically Signed   By: Evangeline Dakin M.D.   On: 12/15/2015 15:12    1850:  IV NS x2L given. Pt states she continues to be lightheaded upon standing. Continues to c/o nausea despite several doses of IV and PO anti-emetic.  T/C to [facility]. Shanon Brow, case discussed, including:  HPI, pertinent PM/SHx, VS/PE, dx testing, ED course and treatment:  Agreeable to admit, requests to write temporary orders, obtain observation medical bed to team APAdmits.   Francine Graven, DO 12/18/15 1013

## 2015-12-15 NOTE — ED Notes (Signed)
Pt ambulatory to bathroom.  Continues to feel dizzy when she gets up.  Also continues to c/o nausea.

## 2015-12-15 NOTE — H&P (Signed)
PCP:   Mickie Hillier, MD   Chief Complaint:  N/v/d, dizzy  HPI: 50 yo female with several days of n/v/d that seems to have subsided but she is having dizziness when she gets up to walk.  She saw her pcp recently who told her she had the flu and was placed on amoxicillin.  Pt takes care of multiple toddlers, denies any sick contacts.  No chest pain or abdominal pain.  Reports temp yesterday of 101.  Given 2liters of ivf in ED and still dizzy when she gets up to move around.  Referred for admission for dehydration.  Review of Systems:  Positive and negative as per HPI otherwise all other systems are negative  Past Medical History: Past Medical History  Diagnosis Date  . Depression 06/22/2011  . GERD (gastroesophageal reflux disease) 06/22/2011  . DM (diabetes mellitus) ([place]) 06/22/2011  . Hypercholesterolemia 06/22/2011  . Hypertension 06/22/2011  . Obesity 06/22/2011  . Hx of adenomatous colonic polyps 10/2007    34mm sigmoid tubular adenoma, FH colon cancer, mother in mid-50s  . Anxiety   . Breast cancer ([place]) 05/22/2011    03/01/11, Stage 2, s/p lumpectomy, chemo/xrt  . DDD (degenerative disc disease), lumbar   . 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[place]) 05/22/2011  . Vaginal bleeding 03/08/2014  . Shortness of breath   . Blood transfusion without reported diagnosis 1999    due to heavy menses  . Genital warts   . 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 Iron deficiency anemia 03/25/2015   Past Surgical History  Procedure Laterality Date  . Back surg x2    . Cholecystectomy    . Mm breast stereo bx*l*r/s      rt.  . Mastectomy partial / lumpectomy      rt.  . Colonoscopy  11/03/07    4-mm sessile polyp removed/small internal hemorrhoids/tubular adenoma, random colon bx negative for microscopic colitis  . Portacath placement    . Port-a-cath removal  05/26/2012    Procedure: REMOVAL PORT-A-CATH;  Surgeon: Jamesetta So, MD;  Location: [facility];   Service: General;  Laterality: N/A;  Minor Room  . Dilatation & curettage/hysteroscopy with myosure N/A 02/10/2015    Procedure: DILATATION & CURETTAGE/HYSTEROSCOPY WITH MYOSURE;  Surgeon: Nunzio Cobbs, MD;  Location: [facility];  Service: Gynecology;  Laterality: N/A;    Medications: Prior to Admission medications   Medication Sig Start Date End Date Taking? Authorizing Provider  albuterol (PROVENTIL HFA;VENTOLIN HFA) 108 (90 BASE) MCG/ACT inhaler Inhale 1-2 puffs into the lungs every 6 (six) hours as needed for wheezing or shortness of breath. 07/17/15  Yes Evalee Jefferson, PA-C  ALPRAZolam Duanne Moron) 0.5 MG tablet Take 0.5 mg by mouth 5 (five) times daily as needed for anxiety.  08/20/14  Yes Historical Provider, MD  amoxicillin (AMOXIL) 500 MG capsule Take 1 capsule (500 mg total) by mouth 3 (three) times daily. For 10 days 12/12/15  Yes Mikey Kirschner, MD  citalopram (CELEXA) 40 MG tablet Take 1 tablet (40 mg total) by mouth daily. 01/28/15  Yes Mikey Kirschner, MD  etodolac (LODINE) 400 MG tablet Take 1 tablet (400 mg total) by mouth 2 (two) times daily. Prn pain with food 12/12/15  Yes Mikey Kirschner, MD  HYDROcodone-acetaminophen (NORCO/VICODIN) 5-325 MG tablet Take 1 tablet by mouth at bedtime as needed. 12/12/15  Yes Mikey Kirschner, MD  lisinopril-hydrochlorothiazide (PRINZIDE,ZESTORETIC) 10-12.5 MG per tablet  Take 1 tablet by mouth daily. 07/04/15  Yes Mikey Kirschner, MD  metFORMIN (GLUCOPHAGE) 500 MG tablet TAKE ONE TABLET BY MOUTH TWICE DAILY 08/08/15  Yes Kathyrn Drown, MD  ondansetron (ZOFRAN ODT) 8 MG disintegrating tablet Take 1 tablet (8 mg total) by mouth every 8 (eight) hours as needed for nausea or vomiting. 12/14/15  Yes Kathyrn Drown, MD  pantoprazole (PROTONIX) 40 MG tablet Take 1 tablet (40 mg total) by mouth daily. For acid reflux 07/04/15  Yes Mikey Kirschner, MD  atorvastatin (LIPITOR) 10 MG tablet Take 1 tablet (10 mg total) by mouth daily. Patient not taking:  Reported on 12/15/2015 08/08/15   Kathyrn Drown, MD    Allergies:   Allergies  Allergen Reactions  . Pravastatin Other (See Comments)    Muscle aches    Social History:  reports that she has never smoked. She has never used smokeless tobacco. She reports that she does not drink alcohol or use illicit drugs.  Family History: Family History  Problem Relation Age of Onset  . Colon cancer Mother     mid-50s, died of metastatic disease  . Cancer Mother     liver  . Coronary artery disease Mother   . Diabetes type I Mother   . Coronary artery disease Father   . Diabetes Father   . Diabetes type I Father   . Hypertension Father   . Bipolar disorder Sister   . Coronary artery disease Brother   . Hypertension Brother   . Hypertension Maternal Grandmother   . Hyperlipidemia Maternal Grandmother   . Stroke Maternal Grandmother   . Hypertension Maternal Grandfather   . Diabetes Maternal Grandfather   . Diabetes Paternal Grandmother   . Heart disease Paternal Grandfather     Physical Exam: Filed Vitals:   12/15/15 1703 12/15/15 1704 12/15/15 1846 12/15/15 1934  BP:  111/81 106/72 124/79  Pulse: 79 79 77 79  Temp:  98.5 F (36.9 C) 98.2 F (36.8 C) 98.3 F (36.8 C)  TempSrc:  Oral Oral Oral  Resp:  18 18 20   Height:      Weight:      SpO2: 100% 96% 100% 95%   General appearance: alert, cooperative and no distress Head: Normocephalic, without obvious abnormality, atraumatic Eyes: negative Nose: Nares normal. Septum midline. Mucosa normal. No drainage or sinus tenderness. Neck: no JVD and supple, symmetrical, trachea midline Lungs: clear to auscultation bilaterally Heart: regular rate and rhythm, S1, S2 normal, no murmur, click, rub or gallop Abdomen: soft, non-tender; bowel sounds normal; no masses,  no organomegaly Extremities: extremities normal, atraumatic, no cyanosis or edema Pulses: 2+ and symmetric Skin: Skin color, texture, turgor normal. No rashes or  lesions Neurologic: Grossly normal    Labs on Admission:   Recent Labs  12/15/15 1423  NA 142  K 3.3*  CL 102  CO2 31  GLUCOSE 122*  BUN 11  CREATININE 1.04*  CALCIUM 9.0    Recent Labs  12/15/15 1423  AST 23  ALT 22  ALKPHOS 43  BILITOT 0.5  PROT 7.3  ALBUMIN 3.5    Recent Labs  12/15/15 1423  LIPASE 25    Recent Labs  12/15/15 1423  WBC 4.8  HGB 12.2  HCT 39.0  MCV 92.4  PLT 213   Radiological Exams on Admission: Dg Chest 2 View  12/15/2015  CLINICAL DATA:  Three day history of left upper chest pain, cough, and fever. Current history of diabetes and  hypertension. EXAM: CHEST  2 VIEW COMPARISON:  8/20 06/1015 and earlier. FINDINGS: Cardiac silhouette normal in size, unchanged. Hilar and mediastinal contours unremarkable. Lungs clear. Bronchovascular markings normal. Pulmonary vascularity normal. No visible pleural effusions. No pneumothorax. Mild degenerative changes involving the thoracic spine and slight thoracic scoliosis convex right, unchanged. IMPRESSION: No acute cardiopulmonary disease.  Stable examination. Electronically Signed   By: Evangeline Dakin M.D.   On: 12/15/2015 15:12   ekg reviewed nsr no acute changes cxr reviewed no infiltrate or edema  Assessment/Plan  50 yo female with GI illness with orthostatic dizziness  Principal Problem:   Orthostatic dizziness-  Symptomatic only, vitals actually not orthostatic.  obs overnight on ivf and supportive care.  Check orthstatic vitals q shift.  Active Problems:   Nausea vomiting and diarrhea- likely viral.  abd exam benign.  Cont abx started by PCP but no evidence of bacterial infection at this time.   GERD (gastroesophageal reflux disease)- noted, prn maalox   DM (diabetes mellitus) ([place])- well controlled, last hga1c less than 7%, so doubt underlying complications of diabetes   Hypertension- hold bp meds for now   Esophageal dysphagia- noted   Chronic diarrhea- noted  obs on medical bed.   FULL CODE.  [name] A 12/15/2015, 7:54 PM

## 2015-12-15 NOTE — ED Notes (Signed)
Pt states she really does not feel any better and wants to be admitted.

## 2015-12-15 NOTE — ED Notes (Signed)
Pt comes in for emesis. Pt was dx with the flu yesterday and told to come to the ED if she was unable to keep fluids down.

## 2015-12-16 DIAGNOSIS — R197 Diarrhea, unspecified: Secondary | ICD-10-CM | POA: Diagnosis not present

## 2015-12-16 DIAGNOSIS — R112 Nausea with vomiting, unspecified: Secondary | ICD-10-CM | POA: Diagnosis not present

## 2015-12-16 LAB — BASIC METABOLIC PANEL
Anion gap: 7 (ref 5–15)
BUN: 9 mg/dL (ref 6–20)
CO2: 30 mmol/L (ref 22–32)
Calcium: 8.7 mg/dL — ABNORMAL LOW (ref 8.9–10.3)
Chloride: 104 mmol/L (ref 101–111)
Creatinine, Ser: 0.89 mg/dL (ref 0.44–1.00)
Glucose, Bld: 95 mg/dL (ref 65–99)
Potassium: 3.9 mmol/L (ref 3.5–5.1)
Sodium: 141 mmol/L (ref 135–145)

## 2015-12-16 LAB — CBC
HCT: 36.6 % (ref 36.0–46.0)
HEMOGLOBIN: 11.3 g/dL — AB (ref 12.0–15.0)
MCH: 28.7 pg (ref 26.0–34.0)
MCHC: 30.9 g/dL (ref 30.0–36.0)
MCV: 92.9 fL (ref 78.0–100.0)
Platelets: 204 10*3/uL (ref 150–400)
RBC: 3.94 MIL/uL (ref 3.87–5.11)
RDW: 15.4 % (ref 11.5–15.5)
WBC: 4.9 10*3/uL (ref 4.0–10.5)

## 2015-12-16 LAB — GLUCOSE, CAPILLARY
GLUCOSE-CAPILLARY: 83 mg/dL (ref 65–99)
GLUCOSE-CAPILLARY: 90 mg/dL (ref 65–99)
GLUCOSE-CAPILLARY: 98 mg/dL (ref 65–99)

## 2015-12-16 MED ORDER — ETODOLAC 200 MG PO CAPS
400.0000 mg | ORAL_CAPSULE | Freq: Two times a day (BID) | ORAL | Status: DC | PRN
  Filled 2015-12-16: qty 2

## 2015-12-16 MED ORDER — ONDANSETRON 8 MG PO TBDP: 8.0000 mg | ORAL_TABLET | Freq: Three times a day (TID) | ORAL | Status: DC | PRN

## 2015-12-16 NOTE — Care Management Note (Signed)
Case Management Note  Patient Details  Name: [name] MRN: [id number] Date of Birth: 1966-05-01  Subjective/Objective:                  Pt from home, lives with fiance and children. Pt ind with ADL's, no DME or HH services prior to admission.   Action/Plan: No CM needs.   Expected Discharge Date:      12/16/2015            Expected Discharge Plan:  Home/Self Care  In-House Referral:  NA  Discharge planning Services  CM Consult  Post Acute Care Choice:  NA Choice offered to:  NA  DME Arranged:    DME Agency:     HH Arranged:    HH Agency:     Status of Service:  Completed, signed off  Medicare Important Message Given:    Date Medicare IM Given:    Medicare IM give by:    Date Additional Medicare IM Given:    Additional Medicare Important Message give by:     If discussed at [place] of Stay Meetings, dates discussed:    Additional Comments:  Sherald Barge, RN 12/16/2015, 3:31 PM

## 2015-12-16 NOTE — Discharge Summary (Signed)
Physician Discharge Summary  [name] [id number] DOB: 08-26-1966 DOA: 12/15/2015  PCP: Mickie Hillier, MD  Admit date: 12/15/2015 Discharge date: 12/16/2015  Time spent: 45 minutes  Recommendations for Outpatient Follow-up:  -We'll be discharge home today. -Advised to follow-up with primary care provider in 2 weeks.   Discharge Diagnoses:  Principal Problem:   Orthostatic dizziness Active Problems:   GERD (gastroesophageal reflux disease)   DM (diabetes mellitus) (HCC)   Hypertension   Esophageal dysphagia   Chronic diarrhea   Nausea vomiting and diarrhea   Discharge Condition: Stable and improved  Filed Weights   12/15/15 1318 12/16/15 0535  Weight: 109.317 kg (241 lb) 110.224 kg (243 lb)    History of present illness:  As per Dr. Shanon Brow on 1/26: 50 yo female with several days of n/v/d that seems to have subsided but she is having dizziness when she gets up to walk. She saw her pcp recently who told her she had the flu and was placed on amoxicillin. Pt takes care of multiple toddlers, denies any sick contacts. No chest pain or abdominal pain. Reports temp yesterday of 101. Given 2liters of ivf in ED and still dizzy when she gets up to move around. Referred for admission for dehydration.  Hospital Course:   Nausea/vomiting/diarrhea -Likely represents acute viral gastroenteritis, is already improving. -We'll discharge on Zofran for nausea as needed.  Rest of chronic conditions have been stable and home medications have not been altered.  Procedures:  None   Consultations:  None  Discharge Instructions  Discharge Instructions    Diet - low sodium heart healthy    Complete by:  As directed      Increase activity slowly    Complete by:  As directed             Medication List    STOP taking these medications        amoxicillin 500 MG capsule  Commonly known as:  AMOXIL     atorvastatin 10 MG tablet  Commonly known as:  LIPITOR        TAKE these medications        albuterol 108 (90 Base) MCG/ACT inhaler  Commonly known as:  PROVENTIL HFA;VENTOLIN HFA  Inhale 1-2 puffs into the lungs every 6 (six) hours as needed for wheezing or shortness of breath.     ALPRAZolam 0.5 MG tablet  Commonly known as:  XANAX  Take 0.5 mg by mouth 5 (five) times daily as needed for anxiety.     citalopram 40 MG tablet  Commonly known as:  CELEXA  Take 1 tablet (40 mg total) by mouth daily.     etodolac 400 MG tablet  Commonly known as:  LODINE  Take 1 tablet (400 mg total) by mouth 2 (two) times daily. Prn pain with food     HYDROcodone-acetaminophen 5-325 MG tablet  Commonly known as:  NORCO/VICODIN  Take 1 tablet by mouth at bedtime as needed.     lisinopril-hydrochlorothiazide 10-12.5 MG tablet  Commonly known as:  PRINZIDE,ZESTORETIC  Take 1 tablet by mouth daily.     metFORMIN 500 MG tablet  Commonly known as:  GLUCOPHAGE  TAKE ONE TABLET BY MOUTH TWICE DAILY     ondansetron 8 MG disintegrating tablet  Commonly known as:  ZOFRAN ODT  Take 1 tablet (8 mg total) by mouth every 8 (eight) hours as needed for nausea or vomiting.     pantoprazole 40 MG tablet  Commonly known  as:  PROTONIX  Take 1 tablet (40 mg total) by mouth daily. For acid reflux       Allergies  Allergen Reactions  . Pravastatin Other (See Comments)    Muscle aches       Follow-up Information    Follow up with Mickie Hillier, MD. Schedule an appointment as soon as possible for a visit in 2 weeks.   Specialty:  Family Medicine   Contact information:   [address] [place] [address] [phone]        The results of significant diagnostics from this hospitalization (including imaging, microbiology, ancillary and laboratory) are listed below for reference.    Significant Diagnostic Studies: Dg Chest 2 View  12/15/2015  CLINICAL DATA:  Three day history of left upper chest pain, cough, and fever. Current history of  diabetes and hypertension. EXAM: CHEST  2 VIEW COMPARISON:  8/20 06/1015 and earlier. FINDINGS: Cardiac silhouette normal in size, unchanged. Hilar and mediastinal contours unremarkable. Lungs clear. Bronchovascular markings normal. Pulmonary vascularity normal. No visible pleural effusions. No pneumothorax. Mild degenerative changes involving the thoracic spine and slight thoracic scoliosis convex right, unchanged. IMPRESSION: No acute cardiopulmonary disease.  Stable examination. Electronically Signed   By: Evangeline Dakin M.D.   On: 12/15/2015 15:12   Dg Knee Complete 4 Views Left  12/13/2015  CLINICAL DATA:  Fall through the floor on her house 2 weeks ago, left knee pain EXAM: LEFT KNEE - COMPLETE 4+ VIEW COMPARISON:  03/25/2015 FINDINGS: Four views of the left knee submitted. No acute fracture or subluxation. Minimal narrowing of medial joint compartment. Minimal spurring of medial femoral condyle. Minimal spurring of patella. IMPRESSION: No acute fracture or subluxation.  Minimal degenerative changes. Electronically Signed   By: Lahoma Crocker M.D.   On: 12/13/2015 08:04    Microbiology: No results found for this or any previous visit (from the past 240 hour(s)).   Labs: Basic Metabolic Panel:  Recent Labs Lab 12/15/15 1423 12/16/15 0548  NA 142 141  K 3.3* 3.9  CL 102 104  CO2 31 30  GLUCOSE 122* 95  BUN 11 9  CREATININE 1.04* 0.89  CALCIUM 9.0 8.7*  MG 2.1  --    Liver Function Tests:  Recent Labs Lab 12/15/15 1423  AST 23  ALT 22  ALKPHOS 43  BILITOT 0.5  PROT 7.3  ALBUMIN 3.5    Recent Labs Lab 12/15/15 1423  LIPASE 25   No results for input(s): AMMONIA in the last 168 hours. CBC:  Recent Labs Lab 12/15/15 1423 12/16/15 0548  WBC 4.8 4.9  HGB 12.2 11.3*  HCT 39.0 36.6  MCV 92.4 92.9  PLT 213 204   Cardiac Enzymes: No results for input(s): CKTOTAL, CKMB, CKMBINDEX, TROPONINI in the last 168 hours. BNP: BNP (last 3 results) No results for input(s): BNP  in the last 8760 hours.  ProBNP (last 3 results) No results for input(s): PROBNP in the last 8760 hours.  CBG:  Recent Labs Lab 12/16/15 0148 12/16/15 0744 12/16/15 1142  GLUCAP 83 90 98       Signed:  [facility] Pager: [phone] 12/16/2015, 12:39 PM

## 2015-12-16 NOTE — Progress Notes (Addendum)
Patient did not want to ambulate.  Patient stated she was ready to be discharged and was ready to "get out of here" and wanted her paperwork.  AVS reviewed with patient.  Verbalized understanding of discharge instructions, physician follow-up, and medications.  Prescription given to patient.  Patient's IV removed earlier.  Site WNL.  Reports all belongings intact and in possession at time of discharge.  Patient stable at time of discharge.  Patient escorted by NT via wheelchair to main entrance for discharge.  Patient stable at time of discharge.

## 2015-12-27 ENCOUNTER — Telehealth: Payer: Self-pay | Admitting: Family Medicine

## 2015-12-27 NOTE — Telephone Encounter (Signed)
HYDROcodone-acetaminophen (NORCO/VICODIN) 5-325 MG tablet  Pt would like a refill please, states her leg is still bothering her

## 2015-12-27 NOTE — Telephone Encounter (Signed)
Ref times one, this will be last rx for this injury

## 2015-12-28 MED ORDER — HYDROCODONE-ACETAMINOPHEN 5-325 MG PO TABS: 1.0000 | ORAL_TABLET | Freq: Every evening | ORAL | Status: DC | PRN

## 2015-12-28 NOTE — Telephone Encounter (Signed)
Spoke with patient and informed her per Dr.Steve Luking- We are refilling hydrocodone. This will be the last prescription for this injury. Patient verbalized understanding.

## 2016-01-04 DIAGNOSIS — Z029 Encounter for administrative examinations, unspecified: Secondary | ICD-10-CM

## 2016-01-12 ENCOUNTER — Encounter: Payer: Self-pay | Admitting: Family Medicine

## 2016-01-12 ENCOUNTER — Ambulatory Visit (INDEPENDENT_AMBULATORY_CARE_PROVIDER_SITE_OTHER): Payer: BLUE CROSS/BLUE SHIELD | Admitting: Family Medicine

## 2016-01-12 VITALS — BP 120/80 | Temp 98.9°F | Ht 64.0 in | Wt 242.0 lb

## 2016-01-12 DIAGNOSIS — M7042 Prepatellar bursitis, left knee: Secondary | ICD-10-CM

## 2016-01-12 MED ORDER — CEPHALEXIN 500 MG PO CAPS: 500.0000 mg | ORAL_CAPSULE | Freq: Three times a day (TID) | ORAL | Status: DC

## 2016-01-12 MED ORDER — PREDNISONE 20 MG PO TABS: ORAL_TABLET | ORAL | Status: DC

## 2016-01-12 MED ORDER — HYDROCODONE-ACETAMINOPHEN 5-325 MG PO TABS: 1.0000 | ORAL_TABLET | Freq: Four times a day (QID) | ORAL | Status: DC | PRN

## 2016-01-12 NOTE — Progress Notes (Signed)
   Subjective:    Patient ID: [name], female    DOB: February 28, 1966, 50 y.o.   MRN: [id number]  Leg Pain  The incident occurred more than 1 week ago. The injury mechanism was a fall. The pain is present in the left leg. The quality of the pain is described as aching. The pain is moderate. Associated symptoms include an inability to bear weight. She reports no foreign bodies present. The symptoms are aggravated by weight bearing. She has tried NSAIDs for the symptoms. The treatment provided no relief.   No headache no chest pain no back pain no abdominal pain no change in bowel habits see prior message  Left knee pain both anterior and posterior. Notes swelling notes warmth around the kneecap.  Had x-ray this was negative Review of Systems See above    Objective:   Physical Exam Alert vitals stable lungs clear heart rhythm left knee positive effusion anterior warmth slight crepitation slight posterior fullness good range of motion but with pain with extension       Assessment & Plan:  Impression prepatellar bursitis plan prednisone taper. Pain meds refilled. Work excuse. Keflex 3 times a day 10 days symptom care discussed WSL

## 2016-01-26 ENCOUNTER — Encounter: Payer: Self-pay | Admitting: Family Medicine

## 2016-01-26 ENCOUNTER — Ambulatory Visit (INDEPENDENT_AMBULATORY_CARE_PROVIDER_SITE_OTHER): Payer: BLUE CROSS/BLUE SHIELD | Admitting: Family Medicine

## 2016-01-26 VITALS — Temp 102.2°F | Ht 64.0 in | Wt 239.1 lb

## 2016-01-26 DIAGNOSIS — J111 Influenza due to unidentified influenza virus with other respiratory manifestations: Secondary | ICD-10-CM | POA: Diagnosis not present

## 2016-01-26 DIAGNOSIS — B9689 Other specified bacterial agents as the cause of diseases classified elsewhere: Secondary | ICD-10-CM

## 2016-01-26 DIAGNOSIS — J019 Acute sinusitis, unspecified: Secondary | ICD-10-CM

## 2016-01-26 MED ORDER — CEFPROZIL 500 MG PO TABS: 500.0000 mg | ORAL_TABLET | Freq: Two times a day (BID) | ORAL | Status: DC

## 2016-01-26 MED ORDER — HYDROCODONE-HOMATROPINE 5-1.5 MG/5ML PO SYRP: 5.0000 mL | ORAL_SOLUTION | Freq: Four times a day (QID) | ORAL | Status: DC | PRN

## 2016-01-26 MED ORDER — OSELTAMIVIR PHOSPHATE 75 MG PO CAPS: 75.0000 mg | ORAL_CAPSULE | Freq: Two times a day (BID) | ORAL | Status: DC

## 2016-01-26 NOTE — Patient Instructions (Signed)

## 2016-01-26 NOTE — Progress Notes (Signed)
   Subjective:    Patient ID: [name], female    DOB: 1966-04-26, 50 y.o.   MRN: [id number]  Fever  This is a new problem. The current episode started in the past 7 days. The problem occurs intermittently. The problem has been unchanged. Associated symptoms include coughing, headaches, a sore throat, vomiting and wheezing. Associated symptoms comments: Runny nose. She has tried nothing for the symptoms. The treatment provided no relief.    patients had symptoms over the past 36 hours with headache fever chills cough congestion sinus pressure pain discomfort and fever and chills. PMH benign.   Review of Systems  Constitutional: Positive for fever.  HENT: Positive for sore throat.   Respiratory: Positive for cough and wheezing.   Gastrointestinal: Positive for vomiting.  Neurological: Positive for headaches.       Objective:   Physical Exam  Constitutional: She appears well-developed.  HENT:  Head: Normocephalic.  Nose: Nose normal.  Mouth/Throat: Oropharynx is clear and moist. No oropharyngeal exudate.  Neck: Neck supple.  Cardiovascular: Normal rate and normal heart sounds.   No murmur heard. Pulmonary/Chest: Effort normal and breath sounds normal. She has no wheezes.  Lymphadenopathy:    She has no cervical adenopathy.  Skin: Skin is warm and dry.  Nursing note and vitals reviewed.         Assessment & Plan:   febrile illness Influenza -I recommend Tamiflu as prescribed Influenza-the patient was diagnosed with influenza. Patient/family educated about the flu and warning signs to watch for. If difficulty breathing, severe neck pain and stiffness, cyanosis, disorientation, or progressive worsening then immediately get rechecked at that ER. If progressive symptoms be certain to be rechecked. Supportive measures such as Tylenol/ibuprofen was discussed. No aspirin use in children. And influenza home care instruction sheet was given.  Finally I am partner on an  antibiotic because her could be some secondary sinus infection and bronchial infection I doubt pneumonia  Zofran for nausea Hycodan for cough caution drowsiness

## 2016-02-09 ENCOUNTER — Ambulatory Visit: Payer: BLUE CROSS/BLUE SHIELD | Admitting: Nurse Practitioner

## 2016-02-09 ENCOUNTER — Other Ambulatory Visit (HOSPITAL_COMMUNITY): Payer: Self-pay | Admitting: Oncology

## 2016-02-09 DIAGNOSIS — C50821 Malignant neoplasm of overlapping sites of right male breast: Secondary | ICD-10-CM

## 2016-02-29 ENCOUNTER — Other Ambulatory Visit (HOSPITAL_COMMUNITY): Payer: Self-pay | Admitting: Oncology

## 2016-02-29 DIAGNOSIS — Z9889 Other specified postprocedural states: Secondary | ICD-10-CM

## 2016-03-06 ENCOUNTER — Ambulatory Visit (HOSPITAL_COMMUNITY)
Admission: RE | Admit: 2016-03-06 | Discharge: 2016-03-06 | Disposition: A | Discharge status: Home / Self Care | Payer: BLUE CROSS/BLUE SHIELD | Source: Ambulatory Visit | Attending: Oncology | Admitting: Oncology

## 2016-03-06 ENCOUNTER — Encounter (HOSPITAL_COMMUNITY): Payer: PRIVATE HEALTH INSURANCE

## 2016-03-06 DIAGNOSIS — Z1231 Encounter for screening mammogram for malignant neoplasm of breast: Secondary | ICD-10-CM | POA: Diagnosis not present

## 2016-03-06 DIAGNOSIS — Z9889 Other specified postprocedural states: Secondary | ICD-10-CM | POA: Insufficient documentation

## 2016-03-12 ENCOUNTER — Telehealth: Payer: Self-pay | Admitting: *Deleted

## 2016-03-12 NOTE — Telephone Encounter (Signed)
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 Quincy Simmonds.  Please call pt to schedule AEX.  Thank you.

## 2016-03-20 ENCOUNTER — Encounter (HOSPITAL_COMMUNITY): Payer: Self-pay | Admitting: Hematology & Oncology

## 2016-03-20 ENCOUNTER — Encounter (HOSPITAL_COMMUNITY): Payer: PRIVATE HEALTH INSURANCE | Attending: Hematology & Oncology | Admitting: Hematology & Oncology

## 2016-03-20 VITALS — BP 117/78 | HR 106 | Temp 98.1°F | Resp 98 | Wt 240.6 lb

## 2016-03-20 DIAGNOSIS — N926 Irregular menstruation, unspecified: Secondary | ICD-10-CM

## 2016-03-20 DIAGNOSIS — Z1239 Encounter for other screening for malignant neoplasm of breast: Secondary | ICD-10-CM

## 2016-03-20 DIAGNOSIS — C50911 Malignant neoplasm of unspecified site of right female breast: Secondary | ICD-10-CM

## 2016-03-20 DIAGNOSIS — Z853 Personal history of malignant neoplasm of breast: Secondary | ICD-10-CM | POA: Diagnosis not present

## 2016-03-20 NOTE — Patient Instructions (Addendum)
[place] at [facility] Discharge Instructions  RECOMMENDATIONS MADE BY THE CONSULTANT AND ANY TEST RESULTS WILL BE SENT TO YOUR REFERRING PHYSICIAN.   Exam and discussion by Dr Whitney Muse today Return to see the doctor in one year Mammogram and colonoscopy as discussed Exercise is important- walk 30 minutes a day  Please call the clinic if you have any questions or concerns      Thank you for choosing [place] at [facility] to provide your oncology and hematology care.  To afford each patient quality time with our provider, please arrive at least 15 minutes before your scheduled appointment time.   Beginning January 23rd 2017 lab work for the [facility] will be done in the  Main lab at [facility] on 1st floor. If you have a lab appointment with the [facility] please come in thru the  Main Entrance and check in at the main information desk  You need to re-schedule your appointment should you arrive 10 or more minutes late.  We strive to give you quality time with our providers, and arriving late affects you and other patients whose appointments are after yours.  Also, if you no show three or more times for appointments you may be dismissed from the clinic at the providers discretion.     Again, thank you for choosing [facility].  Our hope is that these requests will decrease the amount of time that you wait before being seen by our physicians.       _____________________________________________________________  Should you have questions after your visit to [facility], please contact our office at [phone] between the hours of 8:30 a.m. and 4:30 p.m.  Voicemails left after 4:30 p.m. will not be returned until the following business day.  For prescription refill requests, have your pharmacy contact our office.         Resources For Cancer Patients and their Caregivers ? American [employer]  Society: Can assist with transportation, wigs, general needs, runs Look Good Feel Better.        917-032-7993 ? Cancer Care: Provides financial assistance, online support groups, medication/co-pay assistance.  1-800-813-HOPE (386)581-0560) ? [place] Assists [place] cancer patients and their families through emotional , educational and financial support.  (405)345-8923 ? [place] DSS Where to apply for food stamps, Medicaid and utility assistance. 9043929820 ? [facility]: Transportation to medical appointments. 856 702 1878 ? Social Security Administration: May apply for disability if have a Stage IV cancer. 623-276-3885 (367)464-5393 ? [employer], Disability and Transit Services: Assists with nutrition, care and transit needs. 838-589-0427

## 2016-03-20 NOTE — Progress Notes (Signed)
Mickie Hillier, MD [place] [address]  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[place]) - Plan: MM DIAG BREAST TOMO BILATERAL  Screening breast examination - Plan: MM DIAG BREAST TOMO BILATERAL  CURRENT THERAPY: She was prescribed Tamoxifen 20 mg daily for PR positivity greater than 1% based off of ASCO recommendations by Dr. Orlene Och on 04/28/2013, but she did not start due to fear of the medication   INTERVAL HISTORY: JORGINA BINNING 50 y.o. 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 This was an ER negative, PR 2%, Ki-67 marker high at 90%, Her2 negative cancer. S/P lumpectomy followed by adjuvant chemotherapy with 4 cycles of AC followed by 12 weekly Taxol chemotherapy completed on 10/19/11. She also received breast irradiation completed in 12/31/2011. She was prescribed Tamoxifen 20 mg daily for PR positivity greater than 1% based off of ASCO recommendations by Dr. Orlene Och on 04/28/2013, but she did not start due to fear of the medication.    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[place])   02/28/2011 Initial Diagnosis Right needle core biopsy demonstrating Invasive ductal carcinoma of right breast   03/21/2011 Surgery Right lumpectomy demonstrating a 3.8 cm invasive ductal carcinoma, grade III, no LVI, and 0/1 lymph nodes   05/11/2011 - 07/13/2011 Chemotherapy AC x 4   08/03/2011 - 10/19/2011 Chemotherapy Paclitaxel weekly x 12   11/07/2011 - 12/31/2011 Radiation Therapy    02/10/2015 Procedure Hysteroscopy with dilation and curettage by Dr. Josefa Half.   02/10/2015 Pathology Results Diagnosis Endometrium, curettage - ABUNDANT BLOOD AND DEGENERATIVE SECRETORY ENDOMETRIUM WITH STROMAL BREAKDOWN (VERY LIMITED MATERIAL). - INFLAMED SQUAMOUS EPITHELIUM AND ENDOCERVICAL MUCOSA, NO DYSPLASIA OR MALIGNANCY.    She is S/P a hysteroscopy with  dilation and curettage by Dr. Quincy Simmonds in March 2016.    Oncologically, she denies any complaints and ROS questioning is negative.   Ms. Jerilee Hoh returns to the [facility] today unaccompanied.   She recently saw her primary care provider in March for the flu. During her appointment, she says she's had the flu twice; "intestinal and the other one." Overall, she notes no problems and that she's feeling good.  When asked about a family history of breast cancer, she notes that her great aunt had it. She received genetic testing and didn't have any kind of genetic markers. Had her genetic testing done at [facility].  She notes that her mother had colon cancer that spread to her liver, which she died from. As a result, Ms. Jerilee Hoh has been receiving colonoscopies every 5 years with Dr. Sydell Axon and Dr. Oneida Alar. She receives pap smears every year. She also had a biopsy on her uterus and cervix due to her very irregular menstrual cycles.  She is a non-smoker.  In terms of physical activity, she says she works so much she's not as active as she'd like to be. She also notes that her weight fluctuates, saying that she will lose five pounds, then gain it back. This discourages her sometimes. 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 She doesn't have a "dedicated exercise." She notes that she "admits it" and knows she needs to push herself more to walk and be more active. In addition to this, she reports that she's cut back on her bread intake and soda  intake. She notes however that when she doesn't drink at least something with caffeine in it per day, she gets bad headaches. In spite of this, she's trying to wean off.  She saw a loved one have a heart attack last week, which she feels was due in part to bad eating and exercise habits. This has proved some more impetus for her desire to get on top of her weight and physical activity, for both the sake of her  health and self-esteem.  She is up to date on screening mammogram. She is here today for ongoing oncology followup.   Past Medical History  Diagnosis Date  . Depression 06/22/2011  . GERD (gastroesophageal reflux disease) 06/22/2011  . DM (diabetes mellitus) ([place]) 06/22/2011  . Hypercholesterolemia 06/22/2011  . Hypertension 06/22/2011  . Obesity 06/22/2011  . Hx of adenomatous colonic polyps 10/2007    80m sigmoid tubular adenoma, FH colon cancer, mother in mid-50s  . Anxiety   . Breast cancer ([facility] 05/22/2011    03/01/11, Stage 2, s/p lumpectomy, chemo/xrt  . DDD (degenerative disc disease), lumbar   . 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[facility] 05/22/2011  . Vaginal bleeding 03/08/2014  . Shortness of breath   . Blood transfusion without reported diagnosis 1999    due to heavy menses  . Genital warts   . 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 Iron deficiency anemia 03/25/2015    has 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[facility]; Depression; GERD (gastroesophageal reflux disease); DM (diabetes mellitus) ([place]; Hypercholesterolemia; Hypertension; Obesity; H/O adenomatous polyp of colon; FH: colon cancer; Esophageal dysphagia; Chronic diarrhea; Excessive or frequent menstruation; Dysmenorrhea; Iron deficiency anemia; Dehydration; Orthostatic dizziness; and Nausea vomiting and diarrhea on her problem list.     is allergic to pravastatin.  Ms. HJerilee Hohdoes not currently have medications on file.  Past Surgical History  Procedure Laterality Date  . Back surg x2    . Cholecystectomy    . Mm breast stereo bx*l*r/s      rt.  . Mastectomy partial / lumpectomy      rt.  . Colonoscopy  11/03/07    4-mm sessile polyp removed/small internal hemorrhoids/tubular adenoma, random colon bx negative for microscopic colitis  . Portacath placement    . Port-a-cath removal  05/26/2012    Procedure: REMOVAL PORT-A-CATH;  Surgeon: MJamesetta So MD;  Location: [facility];   Service: General;  Laterality: N/A;  Minor Room  . Dilatation & curettage/hysteroscopy with myosure N/A 02/10/2015    Procedure: DILATATION & CURETTAGE/HYSTEROSCOPY WITH MYOSURE;  Surgeon: BNunzio Cobbs MD;  [facility];  Service: Gynecology;  Laterality: N/A;    Denies any headaches, dizziness, double vision, fevers, chills, night sweats, nausea, vomiting, diarrhea, constipation, chest pain, heart palpitations, shortness of breath, blood in stool, black tarry stool, urinary pain, urinary burning, urinary frequency, hematuria.  14 point review of systems was performed and is negative except as detailed under history of present illness and above    PHYSICAL EXAMINATION  ECOG PERFORMANCE STATUS: 0 - Asymptomatic  Filed Vitals:   03/20/16 1303  BP: 117/78  Pulse: 106  Temp: 98.1 F (36.7 C)  Resp: 98    Physical Exam  Constitutional: She is oriented to person, place, and time and well-developed, well-nourished, and in no distress.  Piercings, above lip and tongue HENT:  Head: Normocephalic.  Eyes: Conjunctivae and EOM are normal. Pupils are equal, round, and  reactive to light.  Neck: Normal range of motion.  Cardiovascular: Normal rate, regular rhythm and normal heart sounds.   Pulmonary/Chest: Effort normal and breath sounds normal.  Breast: Normal breast changes from treatment. R-breast firmer, radiation changes. Telangiectasias in the inframammary fold Surgical incision up along the anterior portion of her nipple. Firmer, but normal changes. L breast is unremarkable. Bilateral axillae without adenopathy Abdominal: Soft. Bowel sounds are normal. No rebound or guarding Obese  Musculoskeletal: Normal range of motion.   Neurological: She is alert and oriented to person, place, and time. Gait normal.  Skin: Skin is warm and dry.  Psychiatric: Affect normal.    LABORATORY DATA: I have reviewed the data as listed.  CBC    Component Value Date/Time   WBC 4.9  12/16/2015 0548   RBC 3.94 12/16/2015 0548   HGB 11.3* 12/16/2015 0548   HGB 10.0* 02/10/2015 0848   HCT 36.6 12/16/2015 0548   PLT 204 12/16/2015 0548   MCV 92.9 12/16/2015 0548   MCH 28.7 12/16/2015 0548   MCHC 30.9 12/16/2015 0548   RDW 15.4 12/16/2015 0548   LYMPHSABS 1.7 11/07/2015 1350   MONOABS 0.4 11/07/2015 1350   EOSABS 0.1 11/07/2015 1350   BASOSABS 0.0 11/07/2015 1350      Chemistry      Component Value Date/Time   NA 141 12/16/2015 0548   NA 141 08/02/2015 1212   K 3.9 12/16/2015 0548   CL 104 12/16/2015 0548   CO2 30 12/16/2015 0548   BUN 9 12/16/2015 0548   BUN 15 08/02/2015 1212   CREATININE 0.89 12/16/2015 0548   CREATININE 0.78 06/04/2014 0919      Component Value Date/Time   CALCIUM 8.7* 12/16/2015 0548   ALKPHOS 43 12/15/2015 1423   AST 23 12/15/2015 1423   ALT 22 12/15/2015 1423   BILITOT 0.5 12/15/2015 1423   BILITOT 0.3 08/02/2015 1212     Lab Results  Component Value Date   IRON 45 03/25/2015   TIBC 407 03/25/2015   FERRITIN 124 08/02/2015     RADIOLOGY  Study Result     CLINICAL DATA: History of right breast cancer, diagnosed in 2012. The patient has undergone lumpectomy, radiation therapy, and chemotherapy. Annual exam. Patient is asymptomatic.  EXAM: 2D DIGITAL DIAGNOSTIC BILATERAL MAMMOGRAM WITH CAD AND ADJUNCT TOMO  COMPARISON: Previous exam(s).  ACR Breast Density Category b: There are scattered areas of fibroglandular density.  FINDINGS: There are lumpectomy changes in the central right breast. No mass, nonsurgical distortion, or suspicious microcalcification is identified in either breast to suggest malignancy.  Mammographic images were processed with CAD.  IMPRESSION: No evidence of malignancy in either breast. Lumpectomy changes on the right.  RECOMMENDATION: Diagnostic mammogram is suggested in 1 year. (Code:[id number]-B-01Y)  I have discussed the findings and recommendations with the patient. Results  were also provided in writing at the conclusion of the visit. If applicable, a reminder letter will be sent to the patient regarding the next appointment.  BI-RADS CATEGORY 2: Benign.   Electronically Signed  By: Curlene Dolphin M.D.  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 Breast exam today was benign. We discussed increasing her physical activity. Genetic testing was performed at [facility] per the patient.   At this point, she needs to follow up once a year. She needs to keep up with  her mammograms and continue with her colonoscopies, and stay on top of her well-care, exercise, and personal health.  She continues to follow with Dr. Wolfgang Phoenix for primary care needs.   We will see her back in a year.  Orders Placed This Encounter  Procedures  . MM DIAG BREAST TOMO BILATERAL    Standing Status: Future     Number of Occurrences:      Standing Expiration Date: 05/20/2017    Order Specific Question:  Reason for Exam (SYMPTOM  OR DIAGNOSIS REQUIRED)    Answer:  screening, history R breast cancer    Order Specific Question:  Preferred imaging location?    Answer:  [facility], [place]    Order Specific Question:  Is the patient pregnant?    Answer:  No   All questions were answered. The patient knows to call the clinic with any problems, questions or concerns. We can certainly see the patient much sooner if necessary.  This document serves as a record of services personally performed by Ancil Linsey, MD. It was created on her behalf by Toni Amend, a trained medical scribe. The creation of this record is based on the scribe's personal observations and the provider's statements to them. This document has been checked and approved by the attending provider.  I have reviewed the above documentation for accuracy and completeness and I agree with the  above.  Molli Hazard, MD  03/20/2016

## 2016-03-22 NOTE — Telephone Encounter (Signed)
Message left for patient to return call to [facility] at [phone] to schedule annual exam and pap smear on mobile voicemail per DPR.  Name verified in [place].  [phone] (Home/Mobile) *Preferred*

## 2016-03-29 NOTE — Telephone Encounter (Signed)
Patient returned call on 03/22/16 and scheduled annual exam with Dr. Quincy Simmonds on 05/09/16.  Recall extended to 04/2016.  Routing to provider for final review.  Closing encounter.

## 2016-04-17 ENCOUNTER — Ambulatory Visit (INDEPENDENT_AMBULATORY_CARE_PROVIDER_SITE_OTHER): Payer: BLUE CROSS/BLUE SHIELD | Admitting: Family Medicine

## 2016-04-17 ENCOUNTER — Encounter: Payer: Self-pay | Admitting: Family Medicine

## 2016-04-17 VITALS — BP 110/74 | Temp 98.5°F | Ht 64.0 in | Wt 243.0 lb

## 2016-04-17 DIAGNOSIS — R300 Dysuria: Secondary | ICD-10-CM | POA: Diagnosis not present

## 2016-04-17 DIAGNOSIS — I10 Essential (primary) hypertension: Secondary | ICD-10-CM

## 2016-04-17 DIAGNOSIS — E119 Type 2 diabetes mellitus without complications: Secondary | ICD-10-CM

## 2016-04-17 DIAGNOSIS — E7800 Pure hypercholesterolemia, unspecified: Secondary | ICD-10-CM | POA: Diagnosis not present

## 2016-04-17 DIAGNOSIS — R5383 Other fatigue: Secondary | ICD-10-CM

## 2016-04-17 LAB — POCT URINALYSIS DIPSTICK
PH UA: 5
Spec Grav, UA: 1.02

## 2016-04-17 MED ORDER — CIPROFLOXACIN HCL 500 MG PO TABS: ORAL_TABLET | ORAL | Status: DC

## 2016-04-17 MED ORDER — HYDROCODONE-ACETAMINOPHEN 5-325 MG PO TABS: ORAL_TABLET | ORAL | Status: DC

## 2016-04-17 NOTE — Patient Instructions (Signed)
Use otc azostandard for urin pain

## 2016-04-17 NOTE — Progress Notes (Signed)
   Subjective:    Patient ID: [name], female    DOB: [birth year], 50 y.o.   MRN: [id number]  Urinary Tract Infection  This is a new problem. Episode onset: 2 days. Associated symptoms include frequency, nausea and urgency. Associated symptoms comments: Low, abd pain, Spot of blood when wiping yesterday. She has tried nothing for the symptoms.   Pt wants to get vit d checked. Vit d was low vit d on bloodwork on 08/02/15. Pt states she feeling the same way as when it was low. Having fatigue  Trouble urinating and burning  Results for orders placed or performed in visit on 04/17/16  POCT urinalysis dipstick  Result Value Ref Range   Color, UA     Clarity, UA     Glucose, UA     Bilirubin, UA     Ketones, UA     Spec Grav, UA 1.020    Blood, UA     pH, UA 5.0    Protein, UA     Urobilinogen, UA     Nitrite, UA     Leukocytes, UA small (1+) (A) Negative    Review of Systems  Gastrointestinal: Positive for nausea.  Genitourinary: Positive for urgency and frequency.       Objective:   Physical Exam Alert mild malaise. HEENT normal. Lungs clear. Heart regular in rhythm. No CVA tenderness  rinalysis 4-6 white blood cells primarily on occasion P     Assessment & Plan:  Impression urinary tract infection plan antibiotics prescribed. Symptom care discussed warning signs discussed WSL

## 2016-04-19 ENCOUNTER — Encounter (HOSPITAL_COMMUNITY): Payer: Self-pay | Admitting: Emergency Medicine

## 2016-04-19 ENCOUNTER — Emergency Department (HOSPITAL_COMMUNITY)
Admission: EM | Admit: 2016-04-19 | Discharge: 2016-04-19 | Disposition: A | Discharge status: Home / Self Care | Payer: BLUE CROSS/BLUE SHIELD | Attending: Emergency Medicine | Admitting: Emergency Medicine

## 2016-04-19 DIAGNOSIS — E669 Obesity, unspecified: Secondary | ICD-10-CM | POA: Insufficient documentation

## 2016-04-19 DIAGNOSIS — Z79899 Other long term (current) drug therapy: Secondary | ICD-10-CM | POA: Insufficient documentation

## 2016-04-19 DIAGNOSIS — F329 Major depressive disorder, single episode, unspecified: Secondary | ICD-10-CM | POA: Diagnosis not present

## 2016-04-19 DIAGNOSIS — Z6841 Body Mass Index (BMI) 40.0 and over, adult: Secondary | ICD-10-CM | POA: Diagnosis not present

## 2016-04-19 DIAGNOSIS — Z7984 Long term (current) use of oral hypoglycemic drugs: Secondary | ICD-10-CM | POA: Diagnosis not present

## 2016-04-19 DIAGNOSIS — Z853 Personal history of malignant neoplasm of breast: Secondary | ICD-10-CM | POA: Insufficient documentation

## 2016-04-19 DIAGNOSIS — N390 Urinary tract infection, site not specified: Secondary | ICD-10-CM

## 2016-04-19 DIAGNOSIS — E119 Type 2 diabetes mellitus without complications: Secondary | ICD-10-CM | POA: Insufficient documentation

## 2016-04-19 DIAGNOSIS — Z79891 Long term (current) use of opiate analgesic: Secondary | ICD-10-CM | POA: Diagnosis not present

## 2016-04-19 DIAGNOSIS — R350 Frequency of micturition: Secondary | ICD-10-CM | POA: Diagnosis present

## 2016-04-19 DIAGNOSIS — I10 Essential (primary) hypertension: Secondary | ICD-10-CM | POA: Diagnosis not present

## 2016-04-19 LAB — BASIC METABOLIC PANEL
BUN/Creatinine Ratio: 12 (ref 9–23)
BUN: 12 mg/dL (ref 6–24)
CALCIUM: 9.7 mg/dL (ref 8.7–10.2)
CO2: 27 mmol/L (ref 18–29)
Chloride: 95 mmol/L — ABNORMAL LOW (ref 96–106)
Creatinine, Ser: 0.97 mg/dL (ref 0.57–1.00)
GFR calc Af Amer: 79 mL/min/{1.73_m2} (ref >59)
GFR, EST NON AFRICAN AMERICAN: 68 mL/min/{1.73_m2} (ref >59)
Glucose: 117 mg/dL — ABNORMAL HIGH (ref 65–99)
POTASSIUM: 3.9 mmol/L (ref 3.5–5.2)
Sodium: 140 mmol/L (ref 134–144)

## 2016-04-19 LAB — HEPATIC FUNCTION PANEL
ALBUMIN: 4.1 g/dL (ref 3.5–5.5)
ALK PHOS: 52 IU/L (ref 39–117)
ALT: 23 IU/L (ref 0–32)
AST: 24 IU/L (ref 0–40)
Bilirubin Total: 0.4 mg/dL (ref 0.0–1.2)
Bilirubin, Direct: 0.1 mg/dL (ref 0.00–0.40)
TOTAL PROTEIN: 7.3 g/dL (ref 6.0–8.5)

## 2016-04-19 LAB — URINALYSIS, ROUTINE W REFLEX MICROSCOPIC
Bilirubin Urine: NEGATIVE
Glucose, UA: 100 mg/dL — AB
Ketones, ur: NEGATIVE mg/dL
Nitrite: POSITIVE — AB
Protein, ur: 30 mg/dL — AB
Specific Gravity, Urine: 1.02 (ref 1.005–1.030)
pH: 5 (ref 5.0–8.0)

## 2016-04-19 LAB — LIPID PANEL
Chol/HDL Ratio: 5.8 ratio units — ABNORMAL HIGH (ref 0.0–4.4)
Cholesterol, Total: 220 mg/dL — ABNORMAL HIGH (ref 100–199)
HDL: 38 mg/dL — ABNORMAL LOW (ref >39)
LDL Calculated: 127 mg/dL — ABNORMAL HIGH (ref 0–99)
Triglycerides: 277 mg/dL — ABNORMAL HIGH (ref 0–149)
VLDL Cholesterol Cal: 55 mg/dL — ABNORMAL HIGH (ref 5–40)

## 2016-04-19 LAB — VITAMIN D 25 HYDROXY (VIT D DEFICIENCY, FRACTURES): VIT D 25 HYDROXY: 20.4 ng/mL — AB (ref 30.0–100.0)

## 2016-04-19 LAB — URINE MICROSCOPIC-ADD ON

## 2016-04-19 LAB — HEMOGLOBIN A1C
Est. average glucose Bld gHb Est-mCnc: 128 mg/dL
Hgb A1c MFr Bld: 6.1 % — ABNORMAL HIGH (ref 4.8–5.6)

## 2016-04-19 MED ORDER — HYDROCODONE-ACETAMINOPHEN 5-325 MG PO TABS: 1.0000 | ORAL_TABLET | ORAL | Status: DC | PRN

## 2016-04-19 MED ORDER — ONDANSETRON HCL 4 MG PO TABS
4.0000 mg | ORAL_TABLET | Freq: Once | ORAL | Status: AC
  Administered 2016-04-19: 4 mg via ORAL
  Filled 2016-04-19: qty 1

## 2016-04-19 MED ORDER — CEFTRIAXONE SODIUM 1 G IJ SOLR
1.0000 g | Freq: Once | INTRAMUSCULAR | Status: AC
  Administered 2016-04-19: 1 g via INTRAMUSCULAR
  Filled 2016-04-19: qty 10

## 2016-04-19 MED ORDER — HYDROCODONE-ACETAMINOPHEN 5-325 MG PO TABS
2.0000 | ORAL_TABLET | Freq: Once | ORAL | Status: AC
  Administered 2016-04-19: 2 via ORAL
  Filled 2016-04-19: qty 2

## 2016-04-19 MED ORDER — LIDOCAINE HCL (PF) 1 % IJ SOLN
2.1000 mL | Freq: Once | INTRAMUSCULAR | Status: AC
  Administered 2016-04-19: 2.1 mL
  Filled 2016-04-19: qty 5

## 2016-04-19 NOTE — ED Notes (Signed)
PT stated increased urinary frequency and pain with urination with nausea x3 days and stated started CIPRO 04/17/16 with OTC uristat with no relief.

## 2016-04-19 NOTE — Discharge Instructions (Signed)
Please increase fluids. Finish your cipro and AZO. Use tylenol or ibuprofen for mild pain. Use NOrco for more severe pain. This medication may cause drowsiness. Please do not drink, drive, or participate in activity that requires concentration while taking this medication. Please have your urine rechecked in 7 to 10 days by your MD. Return to the ED if any high fever, blood in urine, excessive vomiting, or emergent changes in condition. Dysuria Dysuria is pain or discomfort while urinating. The pain or discomfort may be felt in the tube that carries urine out of the bladder (urethra) or in the surrounding tissue of the genitals. The pain may also be felt in the groin area, lower abdomen, and lower back. You may have to urinate frequently or have the sudden feeling that you have to urinate (urgency). Dysuria can affect both men and women, but is more common in women. Dysuria can be caused by many different things, including:  Urinary tract infection in women.  Infection of the kidney or bladder.  Kidney stones or bladder stones.  Certain sexually transmitted infections (STIs), such as chlamydia.  Dehydration.  Inflammation of the vagina.  Use of certain medicines.  Use of certain soaps or scented products that cause irritation. HOME CARE INSTRUCTIONS Watch your dysuria for any changes. The following actions may help to reduce any discomfort you are feeling:  Drink enough fluid to keep your urine clear or pale yellow.  Empty your bladder often. Avoid holding urine for long periods of time.  After a bowel movement or urination, women should cleanse from front to back, using each tissue only once.  Empty your bladder after sexual intercourse.  Take medicines only as directed by your health care provider.  If you were prescribed an antibiotic medicine, finish it all even if you start to feel better.  Avoid caffeine, tea, and alcohol. They can irritate the bladder and make dysuria worse.  In men, alcohol may irritate the prostate.  Keep all follow-up visits as directed by your health care provider. This is important.  If you had any tests done to find the cause of dysuria, it is your responsibility to obtain your test results. Ask the lab or department performing the test when and how you will get your results. Talk with your health care provider if you have any questions about your results. SEEK MEDICAL CARE IF:  You develop pain in your back or sides.  You have a fever.  You have nausea or vomiting.  You have blood in your urine.  You are not urinating as often as you usually do. SEEK IMMEDIATE MEDICAL CARE IF:  You pain is severe and not relieved with medicines.  You are unable to hold down any fluids.  You or someone else notices a change in your mental function.  You have a rapid heartbeat at rest.  You have shaking or chills.  You feel extremely weak.   This information is not intended to replace advice given to you by your health care provider. Make sure you discuss any questions you have with your health care provider.   Document Released: 08/03/2004 Document Revised: 11/26/2014 Document Reviewed: 07/01/2014 Elsevier Interactive Patient Education [employer].

## 2016-04-19 NOTE — ED Provider Notes (Signed)
CSN: [id number]     Arrival date & time 04/19/16  0856 History   First MD Initiated Contact with Patient 04/19/16 0900     Chief Complaint  Patient presents with  . Urinary Frequency     (Consider location/radiation/quality/duration/timing/severity/associated sxs/prior Treatment) Patient is a 50 y.o. female presenting with frequency. The history is provided by the patient.  Urinary Frequency This is a new problem. The current episode started in the past 7 days. The problem occurs intermittently. The problem has been gradually worsening. Associated symptoms include nausea. Pertinent negatives include no chills or fever. Associated symptoms comments: Dysuria and back pain. Exacerbated by: bending and urination. Treatments tried: cipro and Azo x 2 days. The treatment provided no relief.    Past Medical History  Diagnosis Date  . Depression 06/22/2011  . GERD (gastroesophageal reflux disease) 06/22/2011  . DM (diabetes mellitus) ([place]) 06/22/2011  . Hypercholesterolemia 06/22/2011  . Hypertension 06/22/2011  . Obesity 06/22/2011  . Hx of adenomatous colonic polyps 10/2007    13mm sigmoid tubular adenoma, FH colon cancer, mother in mid-50s  . Anxiety   . Breast cancer ([place]) 05/22/2011    03/01/11, Stage 2, s/p lumpectomy, chemo/xrt  . DDD (degenerative disc disease), lumbar   . 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[place]) 05/22/2011  . Vaginal bleeding 03/08/2014  . Shortness of breath   . Blood transfusion without reported diagnosis 1999    due to heavy menses  . Genital warts   . 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 Iron deficiency anemia 03/25/2015   Past Surgical History  Procedure Laterality Date  . Back surg x2    . Cholecystectomy    . Mm breast stereo bx*l*r/s      rt.  . Mastectomy partial / lumpectomy      rt.  . Colonoscopy  11/03/07    4-mm sessile polyp removed/small internal hemorrhoids/tubular adenoma, random colon bx negative for  microscopic colitis  . Portacath placement    . Port-a-cath removal  05/26/2012    Procedure: REMOVAL PORT-A-CATH;  Surgeon: Jamesetta So, MD;  Location: [facility];  Service: General;  Laterality: N/A;  Minor Room  . Dilatation & curettage/hysteroscopy with myosure N/A 02/10/2015    Procedure: DILATATION & CURETTAGE/HYSTEROSCOPY WITH MYOSURE;  Surgeon: Nunzio Cobbs, MD;  Location: [facility];  Service: Gynecology;  Laterality: N/A;   Family History  Problem Relation Age of Onset  . Colon cancer Mother     mid-50s, died of metastatic disease  . Cancer Mother     liver  . Coronary artery disease Mother   . Diabetes type I Mother   . Coronary artery disease Father   . Diabetes Father   . Diabetes type I Father   . Hypertension Father   . Bipolar disorder Sister   . Coronary artery disease Brother   . Hypertension Brother   . Hypertension Maternal Grandmother   . Hyperlipidemia Maternal Grandmother   . Stroke Maternal Grandmother   . Hypertension Maternal Grandfather   . Diabetes Maternal Grandfather   . Diabetes Paternal Grandmother   . Heart disease Paternal Grandfather    Social History  Substance Use Topics  . Smoking status: Never Smoker   . Smokeless tobacco: Never Used  . Alcohol Use: No   OB History    Gravida Para Term Preterm AB TAB SAB Ectopic Multiple Living   4 3 0 0 1 0 0  0 0 3     Review of Systems  Constitutional: Negative for fever and chills.  Gastrointestinal: Positive for nausea.  Genitourinary: Positive for dysuria and frequency.  Musculoskeletal: Positive for back pain.  Psychiatric/Behavioral: The patient is nervous/anxious.   All other systems reviewed and are negative.     Allergies  Pravastatin  Home Medications   Prior to Admission medications   Medication Sig Start Date End Date Taking? Authorizing Provider  ALPRAZolam Duanne Moron) 0.5 MG tablet Take 0.5 mg by mouth 5 (five) times daily as needed for anxiety.  08/20/14  Yes Historical  Provider, MD  ciprofloxacin (CIPRO) 500 MG tablet Take 1 tablet by mouth twice a day for 7 days 04/17/16  Yes Mikey Kirschner, MD  citalopram (CELEXA) 40 MG tablet Take 1 tablet (40 mg total) by mouth daily. 01/28/15  Yes Mikey Kirschner, MD  HYDROcodone-acetaminophen (NORCO/VICODIN) 5-325 MG tablet Take 1 tablet by mouth every 4-6 hours as needed for pain 04/17/16  Yes Mikey Kirschner, MD  lisinopril-hydrochlorothiazide (PRINZIDE,ZESTORETIC) 10-12.5 MG per tablet Take 1 tablet by mouth daily. 07/04/15  Yes Mikey Kirschner, MD  metFORMIN (GLUCOPHAGE) 500 MG tablet TAKE ONE TABLET BY MOUTH TWICE DAILY 08/08/15  Yes Kathyrn Drown, MD  pantoprazole (PROTONIX) 40 MG tablet Take 1 tablet (40 mg total) by mouth daily. For acid reflux 07/04/15  Yes Mikey Kirschner, MD  phenazopyridine (URISTAT) 95 MG tablet Take 190 mg by mouth 3 (three) times daily as needed for pain.   Yes Historical Provider, MD  ondansetron (ZOFRAN ODT) 8 MG disintegrating tablet Take 1 tablet (8 mg total) by mouth every 8 (eight) hours as needed for nausea or vomiting. Patient not taking: Reported on 04/19/2016 12/16/15   Erline Hau, MD   BP 110/60 mmHg  Pulse 80  Temp(Src) 98.4 F (36.9 C) (Oral)  Resp 18  Ht 5\' 3"  (1.6 m)  Wt 110.224 kg  BMI 43.06 kg/m2  SpO2 96%  LMP 04/05/2016 Physical Exam  Constitutional: She is oriented to person, place, and time. She appears well-developed and well-nourished.  Non-toxic appearance.  HENT:  Head: Normocephalic.  Right Ear: Tympanic membrane and external ear normal.  Left Ear: Tympanic membrane and external ear normal.  Eyes: EOM and lids are normal. Pupils are equal, round, and reactive to light.  Neck: Normal range of motion. Neck supple. Carotid bruit is not present.  Cardiovascular: Normal rate, regular rhythm, normal heart sounds, intact distal pulses and normal pulses.   Pulmonary/Chest: Breath sounds normal. No respiratory distress.  Abdominal: Soft. Bowel sounds  are normal. There is tenderness in the suprapubic area. There is CVA tenderness. There is no rigidity and no guarding.  Left CVAT.  Musculoskeletal: Normal range of motion.  Lymphadenopathy:       Head (right side): No submandibular adenopathy present.       Head (left side): No submandibular adenopathy present.    She has no cervical adenopathy.  Neurological: She is alert and oriented to person, place, and time. She has normal strength. No cranial nerve deficit or sensory deficit.  Skin: Skin is warm and dry.  Psychiatric: She has a normal mood and affect. Her speech is normal.  Nursing note and vitals reviewed.   ED Course  Procedures (including critical care time) Labs Review Labs Reviewed  URINALYSIS, ROUTINE W REFLEX MICROSCOPIC (NOT AT [facility]) - Abnormal; Notable for the following:    Color, Urine ORANGE (*)    Glucose, UA 100 (*)  Hgb urine dipstick TRACE (*)    Protein, ur 30 (*)    Nitrite POSITIVE (*)    Leukocytes, UA TRACE (*)    All other components within normal limits  URINE MICROSCOPIC-ADD ON - Abnormal; Notable for the following:    Squamous Epithelial / LPF 0-5 (*)    Bacteria, UA MANY (*)    All other components within normal limits  URINE CULTURE    Imaging Review No results found. I have personally reviewed and evaluated these images and lab results as part of my medical decision-making.   EKG Interpretation None      MDM  Pt has been on cipro and Azo for 2 days she c/o pain being worse instead of better. No high fever, but some back discomfort(hx of DDD lumbar)  and nausea. UA reveals UTI still present. Culture sent to the lab. Pt has a hx of DM, concerned for early pyelo. Pt treated with IM rocephin. Pt to continue the cipro and AZO. Rx for ten tabs of Norco given to assist with pain. Recheck urine by PCP in 7 to 10 days. Pt to return to the ED if symptoms continue to worsen.   Final diagnoses:  None    *I have reviewed nursing notes, vital  signs, and all appropriate lab and imaging results for this patient.[address], PA-C 04/19/16 Bailey, MD 04/20/16 [phone]

## 2016-04-19 NOTE — ED Notes (Signed)
Pt made aware to return if symptoms worsen or if any life threatening symptoms occur.   

## 2016-04-20 LAB — URINE CULTURE: Culture: NO GROWTH

## 2016-04-23 ENCOUNTER — Telehealth: Payer: Self-pay | Admitting: Family Medicine

## 2016-04-23 MED ORDER — NITROFURANTOIN MONOHYD MACRO 100 MG PO CAPS: 100.0000 mg | ORAL_CAPSULE | Freq: Two times a day (BID) | ORAL | Status: DC

## 2016-04-23 NOTE — Telephone Encounter (Signed)
Pt was seen at ED on 6/1 and issued antibiotic for UTI from [place] on 5/30 They told her to keep using the same antibiotic. She is calling today  To say that she is still experiencing some burning with urination, frequency  but no Low back pain, no fever at this time   Does she need another round of antibiotic?   [facility]

## 2016-04-23 NOTE — Telephone Encounter (Signed)
Rx sent electronically to pharmacy. Patient notified. 

## 2016-04-23 NOTE — Telephone Encounter (Signed)
Can try macrobid 100 bid for seven d, if persist rec gyn visit with her gyn or with carolyn

## 2016-05-08 ENCOUNTER — Ambulatory Visit: Payer: BLUE CROSS/BLUE SHIELD | Admitting: Family Medicine

## 2016-05-09 ENCOUNTER — Ambulatory Visit: Payer: Self-pay | Admitting: Obstetrics and Gynecology

## 2016-05-15 ENCOUNTER — Encounter: Payer: Self-pay | Admitting: Family Medicine

## 2016-05-15 ENCOUNTER — Ambulatory Visit (INDEPENDENT_AMBULATORY_CARE_PROVIDER_SITE_OTHER): Payer: BLUE CROSS/BLUE SHIELD | Admitting: Family Medicine

## 2016-05-15 VITALS — BP 132/82 | Ht 64.0 in | Wt 242.4 lb

## 2016-05-15 DIAGNOSIS — I10 Essential (primary) hypertension: Secondary | ICD-10-CM

## 2016-05-15 DIAGNOSIS — E7800 Pure hypercholesterolemia, unspecified: Secondary | ICD-10-CM | POA: Diagnosis not present

## 2016-05-15 DIAGNOSIS — E119 Type 2 diabetes mellitus without complications: Secondary | ICD-10-CM

## 2016-05-15 DIAGNOSIS — B349 Viral infection, unspecified: Secondary | ICD-10-CM | POA: Diagnosis not present

## 2016-05-15 MED ORDER — PANTOPRAZOLE SODIUM 40 MG PO TBEC: 40.0000 mg | DELAYED_RELEASE_TABLET | Freq: Every day | ORAL | Status: DC

## 2016-05-15 MED ORDER — METFORMIN HCL 500 MG PO TABS: 500.0000 mg | ORAL_TABLET | Freq: Two times a day (BID) | ORAL | Status: DC

## 2016-05-15 MED ORDER — LISINOPRIL-HYDROCHLOROTHIAZIDE 10-12.5 MG PO TABS: 1.0000 | ORAL_TABLET | Freq: Every day | ORAL | Status: DC

## 2016-05-15 NOTE — Progress Notes (Signed)
   Subjective:    Patient ID: [name], female    DOB: 1966/08/02, 50 y.o.   MRN: [id number]  Diabetes She presents for her follow-up diabetic visit. She has type 2 diabetes mellitus. Risk factors for coronary artery disease include diabetes mellitus, dyslipidemia, hypertension, obesity, post-menopausal and sedentary lifestyle. Current diabetic treatments: metformin. She is compliant with treatment all of the time. Her weight is stable. She is following a diabetic diet. She has not had a previous visit with a dietitian. She does not see a podiatrist.Eye exam is not current.   Results for orders placed or performed during the hospital encounter of 04/19/16  Urine culture  Result Value Ref Range   Specimen Description URINE, CLEAN CATCH    Special Requests NONE    Culture NO GROWTH Performed at [facility]     Report Status 04/20/2016 FINAL   Urinalysis, Routine w reflex microscopic (not at [facility])  Result Value Ref Range   Color, Urine ORANGE (A) YELLOW   APPearance CLEAR CLEAR   Specific Gravity, Urine 1.020 1.005 - 1.030   pH 5.0 5.0 - 8.0   Glucose, UA 100 (A) NEGATIVE mg/dL   Hgb urine dipstick TRACE (A) NEGATIVE   Bilirubin Urine NEGATIVE NEGATIVE   Ketones, ur NEGATIVE NEGATIVE mg/dL   Protein, ur 30 (A) NEGATIVE mg/dL   Nitrite POSITIVE (A) NEGATIVE   Leukocytes, UA TRACE (A) NEGATIVE  Urine microscopic-add on  Result Value Ref Range   Squamous Epithelial / LPF 0-5 (A) NONE SEEN   WBC, UA 0-5 0 - 5 WBC/hpf   RBC / HPF 0-5 0 - 5 RBC/hpf   Bacteria, UA MANY (A) NONE SEEN  Vit D low  Felt nauseated and dranied and worn out  cks glucoses on occas every pother week Compliant with blood pressure medication. No obvious side effects. Watching salt intake does not miss a dose. Blood pressures generally good when checked elsewhere  Watching fat intake. 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 Blood pressure good on repeat. Obesity present HEENT normal lungs clear heart regular in rhythm ankles without edema abdomen hyperactive bowel sounds no discrete tenderness mild diffuse upper abdominal tenderness      Assessment & Plan:  Impression 1 type 2 diabetes good control discussed maintain same #2 hypertension good control discussed maintain same #3 viral syndrome likely contributing to current symptoms was exposed child with this #4 hyperlipidemia time etiology medication rationale discussed Lipitor encouraged him started. Follow-up as scheduled [facility]

## 2016-06-04 ENCOUNTER — Telehealth: Payer: Self-pay | Admitting: *Deleted

## 2016-06-04 NOTE — Telephone Encounter (Signed)
Dr. Sabra Heck, this patient was in 08 recall for 08/2015. Colletta [place] contacted patient and made her an appointment with Dr. Quincy Simmonds back in May 2017. (see phone note 03-12-16) She cancelled this appointment and did not reschedule. Please advise recall status Thanks Margaretha Sheffield

## 2016-06-05 ENCOUNTER — Encounter: Payer: Self-pay | Admitting: *Deleted

## 2016-06-05 NOTE — Telephone Encounter (Signed)
H/O ascus with +HR HPV.  Pt aware of need for follow up.  Ok to send letter and then remove from recall.  Thanks.

## 2016-06-11 NOTE — Telephone Encounter (Signed)
Letter out -eh

## 2016-07-03 ENCOUNTER — Emergency Department (HOSPITAL_COMMUNITY): Payer: BLUE CROSS/BLUE SHIELD

## 2016-07-03 ENCOUNTER — Other Ambulatory Visit: Payer: Self-pay

## 2016-07-03 ENCOUNTER — Encounter (HOSPITAL_COMMUNITY): Payer: Self-pay | Admitting: *Deleted

## 2016-07-03 ENCOUNTER — Emergency Department (HOSPITAL_COMMUNITY)
Admission: EM | Admit: 2016-07-03 | Discharge: 2016-07-03 | Disposition: A | Discharge status: Home / Self Care | Payer: BLUE CROSS/BLUE SHIELD | Attending: Emergency Medicine | Admitting: Emergency Medicine

## 2016-07-03 DIAGNOSIS — E119 Type 2 diabetes mellitus without complications: Secondary | ICD-10-CM | POA: Insufficient documentation

## 2016-07-03 DIAGNOSIS — Z7984 Long term (current) use of oral hypoglycemic drugs: Secondary | ICD-10-CM | POA: Diagnosis not present

## 2016-07-03 DIAGNOSIS — Z853 Personal history of malignant neoplasm of breast: Secondary | ICD-10-CM | POA: Diagnosis not present

## 2016-07-03 DIAGNOSIS — Z79899 Other long term (current) drug therapy: Secondary | ICD-10-CM | POA: Insufficient documentation

## 2016-07-03 DIAGNOSIS — J209 Acute bronchitis, unspecified: Secondary | ICD-10-CM

## 2016-07-03 DIAGNOSIS — I10 Essential (primary) hypertension: Secondary | ICD-10-CM | POA: Diagnosis not present

## 2016-07-03 DIAGNOSIS — J4521 Mild intermittent asthma with (acute) exacerbation: Secondary | ICD-10-CM

## 2016-07-03 DIAGNOSIS — R05 Cough: Secondary | ICD-10-CM | POA: Diagnosis present

## 2016-07-03 LAB — BASIC METABOLIC PANEL
Anion gap: 9 (ref 5–15)
BUN: 10 mg/dL (ref 6–20)
CO2: 30 mmol/L (ref 22–32)
CREATININE: 0.89 mg/dL (ref 0.44–1.00)
Calcium: 8.9 mg/dL (ref 8.9–10.3)
Chloride: 97 mmol/L — ABNORMAL LOW (ref 101–111)
GFR calc non Af Amer: >60 mL/min (ref >60)
GLUCOSE: 106 mg/dL — AB (ref 65–99)
Potassium: 3.7 mmol/L (ref 3.5–5.1)
Sodium: 136 mmol/L (ref 135–145)

## 2016-07-03 LAB — CBC
HCT: 39.4 % (ref 36.0–46.0)
HEMOGLOBIN: 12.7 g/dL (ref 12.0–15.0)
MCH: 28.7 pg (ref 26.0–34.0)
MCHC: 32.2 g/dL (ref 30.0–36.0)
MCV: 88.9 fL (ref 78.0–100.0)
PLATELETS: 220 10*3/uL (ref 150–400)
RBC: 4.43 MIL/uL (ref 3.87–5.11)
RDW: 15.2 % (ref 11.5–15.5)
WBC: 9.8 10*3/uL (ref 4.0–10.5)

## 2016-07-03 LAB — TROPONIN I

## 2016-07-03 MED ORDER — PREDNISONE 20 MG PO TABS: 20.0000 mg | ORAL_TABLET | Freq: Every day | ORAL | 0 refills | Status: DC

## 2016-07-03 MED ORDER — KETOROLAC TROMETHAMINE 30 MG/ML IJ SOLN
30.0000 mg | Freq: Once | INTRAMUSCULAR | Status: AC
  Administered 2016-07-03: 30 mg via INTRAVENOUS
  Filled 2016-07-03: qty 1

## 2016-07-03 MED ORDER — AZITHROMYCIN 250 MG PO TABS
500.0000 mg | ORAL_TABLET | Freq: Once | ORAL | Status: AC
  Administered 2016-07-03: 500 mg via ORAL
  Filled 2016-07-03: qty 2

## 2016-07-03 MED ORDER — ALBUTEROL SULFATE HFA 108 (90 BASE) MCG/ACT IN AERS
1.0000 | INHALATION_SPRAY | RESPIRATORY_TRACT | Status: DC | PRN
  Administered 2016-07-03: 1 via RESPIRATORY_TRACT
  Filled 2016-07-03: qty 6.7

## 2016-07-03 MED ORDER — AEROCHAMBER PLUS FLO-VU MEDIUM MISC
1.0000 | Freq: Once | Status: AC
  Administered 2016-07-03: 1
  Filled 2016-07-03: qty 1

## 2016-07-03 MED ORDER — HYDROCODONE-ACETAMINOPHEN 5-325 MG PO TABS: 1.0000 | ORAL_TABLET | ORAL | 0 refills | Status: DC | PRN

## 2016-07-03 MED ORDER — MORPHINE SULFATE (PF) 4 MG/ML IV SOLN
4.0000 mg | Freq: Once | INTRAVENOUS | Status: AC
  Administered 2016-07-03: 4 mg via INTRAVENOUS
  Filled 2016-07-03: qty 1

## 2016-07-03 MED ORDER — ALBUTEROL (5 MG/ML) CONTINUOUS INHALATION SOLN
10.0000 mg/h | INHALATION_SOLUTION | RESPIRATORY_TRACT | Status: DC
  Administered 2016-07-03: 10 mg/h via RESPIRATORY_TRACT
  Filled 2016-07-03: qty 20

## 2016-07-03 MED ORDER — AZITHROMYCIN 250 MG PO TABS: 250.0000 mg | ORAL_TABLET | Freq: Every day | ORAL | 0 refills | Status: DC

## 2016-07-03 MED ORDER — METHYLPREDNISOLONE SODIUM SUCC 125 MG IJ SOLR
125.0000 mg | Freq: Once | INTRAMUSCULAR | Status: AC
  Administered 2016-07-03: 125 mg via INTRAVENOUS
  Filled 2016-07-03: qty 2

## 2016-07-03 NOTE — ED Triage Notes (Signed)
Pt comes in for cough and chest congestion. She states she is having pressure in her chest that worsens with cough. Denies n/v/d.

## 2016-07-03 NOTE — ED Provider Notes (Signed)
[facility] DEPT Provider Note   CSN: [id number] Arrival date & time: 07/03/16  1709  By signing my name below, I, Ephriam Jenkins, attest that this documentation has been prepared under the direction and in the presence of Isla Pence, MD. Electronically signed, Ephriam Jenkins, ED Scribe. 07/03/16. 8:47 PM.  History   Chief Complaint Chief Complaint  Patient presents with  . Cough    HPI HPI Comments: [name] is a 50 y.o. Female with a PMHx of DM, HTN, HLD, Obesity, who presents to the Emergency Department complaining of constant, unchanged, cough and sinus congestion that started approximately two days ago. Pt reports a productive cough with yellow sputum. Pt states when she coughs she can feel a pain that radiates into her left arm. Pt notes that she was at work today and had difficulty catching her breath. 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 Pt states she has used an inhaler in the past but denies any recent use.    The history is provided by the patient. No language interpreter was used.  Cough  Associated symptoms include shortness of breath.    Past Medical History:  Diagnosis Date  . Anxiety   . Blood transfusion without reported diagnosis 1999   due to heavy menses  . Breast cancer ([place]) 05/22/2011   03/01/11, Stage 2, s/p lumpectomy, chemo/xrt  . DDD (degenerative disc disease), lumbar   . Depression 06/22/2011  . DM (diabetes mellitus) ([place]) 06/22/2011  . Genital warts   . GERD (gastroesophageal reflux disease) 06/22/2011  . Hx of adenomatous colonic polyps 10/2007   82mm sigmoid tubular adenoma, FH colon cancer, mother in mid-50s  . Hypercholesterolemia 06/22/2011  . Hypertension 06/22/2011  . 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[place]) 05/22/2011  . Iron deficiency anemia 03/25/2015  . Obesity 06/22/2011  . Shortness of breath   . 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 Vaginal bleeding 03/08/2014     Patient Active Problem List   Diagnosis Date Noted  . Dehydration 12/15/2015  . Orthostatic dizziness 12/15/2015  . Nausea vomiting and diarrhea 12/15/2015  . Iron deficiency anemia 03/25/2015  . Excessive or frequent menstruation 04/08/2014  . Dysmenorrhea 04/08/2014  . H/O adenomatous polyp of colon 01/24/2012  . FH: colon cancer 01/24/2012  . Esophageal dysphagia 01/24/2012  . Chronic diarrhea 01/24/2012  . Depression 06/22/2011  . GERD (gastroesophageal reflux disease) 06/22/2011  . DM (diabetes mellitus) ([place]) 06/22/2011  . Hypercholesterolemia 06/22/2011  . Hypertension 06/22/2011  . Obesity 06/22/2011  . 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[place]) 05/22/2011    Past Surgical History:  Procedure Laterality Date  . back surg x2    . CHOLECYSTECTOMY    . COLONOSCOPY  11/03/07   4-mm sessile polyp removed/small internal hemorrhoids/tubular adenoma, random colon bx negative for microscopic colitis  . DILATATION & CURETTAGE/HYSTEROSCOPY WITH MYOSURE N/A 02/10/2015   Procedure: DILATATION & CURETTAGE/HYSTEROSCOPY WITH MYOSURE;  Surgeon: Nunzio Cobbs, MD;  Location: [facility];  Service: Gynecology;  Laterality: N/A;  . MASTECTOMY PARTIAL / LUMPECTOMY     rt.  . MM BREAST STEREO BX*L*R/S     rt.  Marland Kitchen PORT-A-CATH REMOVAL  05/26/2012   Procedure: REMOVAL PORT-A-CATH;  Surgeon: Jamesetta So, MD;  Location: [facility];  Service: General;  Laterality: N/A;  Minor Room  . PORTACATH PLACEMENT      OB History    Gravida Para Term Preterm AB  Living   4 3 0 0 1 3   SAB TAB Ectopic Multiple Live Births   0 0 0 0 3       Home Medications    Prior to Admission medications   Medication Sig Start Date End Date Taking? Authorizing Provider  ALPRAZolam Duanne Moron) 0.5 MG tablet Take 0.5 mg by mouth 5 (five) times daily as needed for anxiety.  08/20/14   Historical Provider, MD  azithromycin (ZITHROMAX Z-PAK) 250 MG tablet Take 1 tablet (250 mg total) by mouth daily. 07/03/16    Isla Pence, MD  citalopram (CELEXA) 40 MG tablet Take 1 tablet (40 mg total) by mouth daily. 01/28/15   Mikey Kirschner, MD  HYDROcodone-acetaminophen (NORCO/VICODIN) 5-325 MG tablet Take 1 tablet by mouth every 4 (four) hours as needed. 07/03/16   Isla Pence, MD  lisinopril-hydrochlorothiazide (PRINZIDE,ZESTORETIC) 10-12.5 MG tablet Take 1 tablet by mouth daily. 05/15/16   Mikey Kirschner, MD  metFORMIN (GLUCOPHAGE) 500 MG tablet Take 1 tablet (500 mg total) by mouth 2 (two) times daily. 05/15/16   Mikey Kirschner, MD  ondansetron (ZOFRAN ODT) 8 MG disintegrating tablet Take 1 tablet (8 mg total) by mouth every 8 (eight) hours as needed for nausea or vomiting. Patient not taking: Reported on 04/19/2016 12/16/15   Erline Hau, MD  pantoprazole (PROTONIX) 40 MG tablet Take 1 tablet (40 mg total) by mouth daily. For acid reflux 05/15/16   Mikey Kirschner, MD  phenazopyridine (URISTAT) 95 MG tablet Take 190 mg by mouth 3 (three) times daily as needed for pain.    Historical Provider, MD  predniSONE (DELTASONE) 20 MG tablet Take 1 tablet (20 mg total) by mouth daily. Take 60 mg for 3 days, then take 40 mg for 3 days, then take 20 mg for 3 days 07/03/16   Isla Pence, MD    Family History Family History  Problem Relation Age of Onset  . Colon cancer Mother     mid-50s, died of metastatic disease  . Cancer Mother     liver  . Coronary artery disease Mother   . Diabetes type I Mother   . Coronary artery disease Father   . Diabetes Father   . Diabetes type I Father   . Hypertension Father   . Bipolar disorder Sister   . Coronary artery disease Brother   . Hypertension Brother   . Hypertension Maternal Grandmother   . Hyperlipidemia Maternal Grandmother   . Stroke Maternal Grandmother   . Hypertension Maternal Grandfather   . Diabetes Maternal Grandfather   . Diabetes Paternal Grandmother   . Heart disease Paternal Grandfather     Social History Social History   Substance Use Topics  . Smoking status: Never Smoker  . Smokeless tobacco: Never Used  . Alcohol use No     Allergies   Pravastatin   Review of Systems Review of Systems  HENT: Positive for congestion.   Respiratory: Positive for cough and shortness of breath.   All other systems reviewed and are negative.    Physical Exam Updated Vital Signs BP 107/60   Pulse 95   Temp 98.9 F (37.2 C) (Oral)   Resp 20   Ht 5\' 4"  (1.626 m)   Wt 250 lb (113.4 kg)   SpO2 93%   BMI 42.91 kg/m   Physical Exam  Constitutional: She is oriented to person, place, and time. She appears well-developed and well-nourished. No distress.  HENT:  Head: Normocephalic and atraumatic.  Sinus congestion  Neck: Normal range of motion.  Pulmonary/Chest: Effort normal.  Wheezing  Neurological: She is alert and oriented to person, place, and time.  Skin: Skin is warm and dry. She is not diaphoretic.  Psychiatric: She has a normal mood and affect. Judgment normal.  Nursing note and vitals reviewed.    ED Treatments / Results  DIAGNOSTIC STUDIES: Oxygen Saturation is 96% on RA, normal by my interpretation.  COORDINATION OF CARE: 8:42 PM-Will order breathing treatment and medication for pain. Discussed treatment plan with pt at bedside and pt agreed to plan.   Labs (all labs ordered are listed, but only abnormal results are displayed) Labs Reviewed  BASIC METABOLIC PANEL - Abnormal; Notable for the following:       Result Value   Chloride 97 (*)    Glucose, Bld 106 (*)    All other components within normal limits  CBC  TROPONIN I    EKG  EKG Interpretation None       Radiology Dg Chest 2 View  Result Date: 07/03/2016 CLINICAL DATA:  Chest pain.  Breast cancer EXAM: CHEST  2 VIEW COMPARISON:  12/15/2015 FINDINGS: The heart size and mediastinal contours are within normal limits. Both lungs are clear. The visualized skeletal structures are unremarkable. IMPRESSION: No active  cardiopulmonary disease. Electronically Signed   By: Franchot Gallo M.D.   On: 07/03/2016 18:20    Procedures Procedures (including critical care time)  Medications Ordered in ED Medications  albuterol (PROVENTIL,VENTOLIN) solution continuous neb (10 mg/hr Nebulization New Bag/Given 07/03/16 2116)  morphine 4 MG/ML injection 4 mg (not administered)  albuterol (PROVENTIL HFA;VENTOLIN HFA) 108 (90 Base) MCG/ACT inhaler 1 puff (not administered)  AEROCHAMBER PLUS FLO-VU MEDIUM MISC 1 each (not administered)  azithromycin (ZITHROMAX) tablet 500 mg (not administered)  ketorolac (TORADOL) 30 MG/ML injection 30 mg (30 mg Intravenous Given 07/03/16 2057)  methylPREDNISolone sodium succinate (SOLU-MEDROL) 125 mg/2 mL injection 125 mg (125 mg Intravenous Given 07/03/16 2057)     Initial Impression / Assessment and Plan / ED Course  I have reviewed the triage vital signs and the nursing notes.  Pertinent labs & imaging results that were available during my care of the patient were reviewed by me and considered in my medical decision making (see chart for details).  Clinical Course  I personally performed the services described in this documentation, which was scribed in my presence. The recorded information has been reviewed and is accurate.   Pt is feeling much better after nebs and steroids.  She will be given an albuterol inhaler prior to d/c.  She knows to return if worse.   Final Clinical Impressions(s) / ED Diagnoses   Final diagnoses:  Acute bronchitis, unspecified organism  RAD (reactive airway disease), mild intermittent, with acute exacerbation    New Prescriptions New Prescriptions   AZITHROMYCIN (ZITHROMAX Z-PAK) 250 MG TABLET    Take 1 tablet (250 mg total) by mouth daily.   HYDROCODONE-ACETAMINOPHEN (NORCO/VICODIN) 5-325 MG TABLET    Take 1 tablet by mouth every 4 (four) hours as needed.   PREDNISONE (DELTASONE) 20 MG TABLET    Take 1 tablet (20 mg total) by mouth daily. Take  60 mg for 3 days, then take 40 mg for 3 days, then take 20 mg for 3 days      Isla Pence, MD 07/03/16 2207

## 2016-08-10 ENCOUNTER — Ambulatory Visit (INDEPENDENT_AMBULATORY_CARE_PROVIDER_SITE_OTHER): Payer: BLUE CROSS/BLUE SHIELD | Admitting: Family Medicine

## 2016-08-10 ENCOUNTER — Encounter: Payer: Self-pay | Admitting: Family Medicine

## 2016-08-10 ENCOUNTER — Other Ambulatory Visit (HOSPITAL_COMMUNITY)
Admission: RE | Admit: 2016-08-10 | Discharge: 2016-08-10 | Disposition: A | Discharge status: Home / Self Care | Payer: BLUE CROSS/BLUE SHIELD | Source: Ambulatory Visit | Attending: Family Medicine | Admitting: Family Medicine

## 2016-08-10 VITALS — BP 112/80 | Temp 98.2°F | Ht 64.0 in | Wt 243.4 lb

## 2016-08-10 DIAGNOSIS — A084 Viral intestinal infection, unspecified: Secondary | ICD-10-CM

## 2016-08-10 DIAGNOSIS — R1084 Generalized abdominal pain: Secondary | ICD-10-CM

## 2016-08-10 LAB — CBC WITH DIFFERENTIAL/PLATELET
Basophils Absolute: 0 10*3/uL (ref 0.0–0.1)
Basophils Relative: 1 %
EOS ABS: 0.1 10*3/uL (ref 0.0–0.7)
Eosinophils Relative: 2 %
HCT: 41 % (ref 36.0–46.0)
HEMOGLOBIN: 13.2 g/dL (ref 12.0–15.0)
LYMPHS PCT: 35 %
Lymphs Abs: 2.8 10*3/uL (ref 0.7–4.0)
MCH: 28.9 pg (ref 26.0–34.0)
MCHC: 32.2 g/dL (ref 30.0–36.0)
MCV: 89.9 fL (ref 78.0–100.0)
MONOS PCT: 8 %
Monocytes Absolute: 0.6 10*3/uL (ref 0.1–1.0)
Neutro Abs: 4.5 10*3/uL (ref 1.7–7.7)
Neutrophils Relative %: 56 %
Platelets: 222 10*3/uL (ref 150–400)
RBC: 4.56 MIL/uL (ref 3.87–5.11)
RDW: 15.4 % (ref 11.5–15.5)
WBC: 8 10*3/uL (ref 4.0–10.5)

## 2016-08-10 LAB — BASIC METABOLIC PANEL
Anion gap: 7 (ref 5–15)
BUN: 12 mg/dL (ref 6–20)
CO2: 29 mmol/L (ref 22–32)
CREATININE: 0.99 mg/dL (ref 0.44–1.00)
Calcium: 8.7 mg/dL — ABNORMAL LOW (ref 8.9–10.3)
Chloride: 105 mmol/L (ref 101–111)
GFR calc Af Amer: >60 mL/min (ref >60)
Glucose, Bld: 101 mg/dL — ABNORMAL HIGH (ref 65–99)
Potassium: 3.8 mmol/L (ref 3.5–5.1)
SODIUM: 141 mmol/L (ref 135–145)

## 2016-08-10 LAB — LIPASE, BLOOD: LIPASE: 25 U/L (ref 11–51)

## 2016-08-10 LAB — HEPATIC FUNCTION PANEL
ALT: 27 U/L (ref 14–54)
AST: 29 U/L (ref 15–41)
Albumin: 4.2 g/dL (ref 3.5–5.0)
Alkaline Phosphatase: 45 U/L (ref 38–126)
BILIRUBIN DIRECT: 0.1 mg/dL (ref 0.1–0.5)
BILIRUBIN TOTAL: 0.5 mg/dL (ref 0.3–1.2)
Indirect Bilirubin: 0.4 mg/dL (ref 0.3–0.9)
TOTAL PROTEIN: 7.9 g/dL (ref 6.5–8.1)

## 2016-08-10 MED ORDER — PROMETHAZINE HCL 25 MG PO TABS: 25.0000 mg | ORAL_TABLET | Freq: Two times a day (BID) | ORAL | 2 refills | Status: DC | PRN

## 2016-08-10 MED ORDER — PROMETHAZINE HCL 25 MG/ML IJ SOLN
25.0000 mg | Freq: Once | INTRAMUSCULAR | Status: AC
  Administered 2016-08-10: 25 mg via INTRAMUSCULAR

## 2016-08-10 MED ORDER — ONDANSETRON 8 MG PO TBDP
8.0000 mg | ORAL_TABLET | Freq: Three times a day (TID) | ORAL | 0 refills | Status: DC | PRN
Start: 2016-08-10 — End: 2016-09-17

## 2016-08-10 NOTE — Progress Notes (Signed)
   Subjective:    Patient ID: [name], female    DOB: Mar 15, 1966, 50 y.o.   MRN: [id number]  Abdominal Pain  This is a new problem. The current episode started in the past 7 days. The problem occurs intermittently. The problem has been unchanged. The pain is located in the generalized abdominal region. The pain is moderate. The quality of the pain is aching. Associated symptoms include diarrhea, nausea and vomiting. Nothing aggravates the pain. The pain is relieved by nothing. She has tried nothing for the symptoms. The treatment provided no relief.   Patient has no other concerns at this time.  Patient relates he earlier this week a lot of vomiting in loose stools nausea diarrhea now mainly is nausea with some vomiting this a.m. no high fever chills no wheezing or difficulty breathing  Review of Systems  Gastrointestinal: Positive for abdominal pain, diarrhea, nausea and vomiting.       Objective:   Physical Exam Lungs clear heart regular abdomen soft mild epigastric tenderness extremities no edema skin warm dry       Assessment & Plan:  Viral gastroenteritis-nausea medicine Finnegan shot Clear liquids frequently Follow-up if progressive symptoms Recheck if problems await the results of stat lab work

## 2016-08-12 ENCOUNTER — Other Ambulatory Visit: Payer: Self-pay | Admitting: Family Medicine

## 2016-08-12 DIAGNOSIS — E785 Hyperlipidemia, unspecified: Secondary | ICD-10-CM

## 2016-08-13 NOTE — Telephone Encounter (Signed)
Three mo worth 

## 2016-08-20 ENCOUNTER — Telehealth: Payer: Self-pay | Admitting: *Deleted

## 2016-08-20 ENCOUNTER — Encounter (HOSPITAL_COMMUNITY): Payer: Self-pay

## 2016-08-20 ENCOUNTER — Encounter: Payer: Self-pay | Admitting: Family Medicine

## 2016-08-20 ENCOUNTER — Ambulatory Visit: Payer: BLUE CROSS/BLUE SHIELD | Admitting: Family Medicine

## 2016-08-20 ENCOUNTER — Emergency Department (HOSPITAL_COMMUNITY): Payer: BLUE CROSS/BLUE SHIELD

## 2016-08-20 ENCOUNTER — Emergency Department (HOSPITAL_COMMUNITY)
Admission: EM | Admit: 2016-08-20 | Discharge: 2016-08-20 | Disposition: A | Discharge status: Home / Self Care | Payer: BLUE CROSS/BLUE SHIELD | Attending: Emergency Medicine | Admitting: Emergency Medicine

## 2016-08-20 DIAGNOSIS — I10 Essential (primary) hypertension: Secondary | ICD-10-CM | POA: Insufficient documentation

## 2016-08-20 DIAGNOSIS — Z853 Personal history of malignant neoplasm of breast: Secondary | ICD-10-CM | POA: Diagnosis not present

## 2016-08-20 DIAGNOSIS — K625 Hemorrhage of anus and rectum: Secondary | ICD-10-CM | POA: Diagnosis not present

## 2016-08-20 DIAGNOSIS — R1030 Lower abdominal pain, unspecified: Secondary | ICD-10-CM | POA: Diagnosis present

## 2016-08-20 DIAGNOSIS — Z79899 Other long term (current) drug therapy: Secondary | ICD-10-CM | POA: Insufficient documentation

## 2016-08-20 DIAGNOSIS — E119 Type 2 diabetes mellitus without complications: Secondary | ICD-10-CM | POA: Insufficient documentation

## 2016-08-20 DIAGNOSIS — Z7984 Long term (current) use of oral hypoglycemic drugs: Secondary | ICD-10-CM | POA: Diagnosis not present

## 2016-08-20 LAB — CBC WITH DIFFERENTIAL/PLATELET
Basophils Absolute: 0 10*3/uL (ref 0.0–0.1)
Basophils Relative: 1 %
Eosinophils Absolute: 0.2 10*3/uL (ref 0.0–0.7)
Eosinophils Relative: 3 %
HCT: 40.7 % (ref 36.0–46.0)
HEMOGLOBIN: 12.9 g/dL (ref 12.0–15.0)
LYMPHS ABS: 2.2 10*3/uL (ref 0.7–4.0)
Lymphocytes Relative: 36 %
MCH: 28.4 pg (ref 26.0–34.0)
MCHC: 31.7 g/dL (ref 30.0–36.0)
MCV: 89.5 fL (ref 78.0–100.0)
MONOS PCT: 6 %
Monocytes Absolute: 0.3 10*3/uL (ref 0.1–1.0)
NEUTROS ABS: 3.3 10*3/uL (ref 1.7–7.7)
NEUTROS PCT: 54 %
Platelets: 194 10*3/uL (ref 150–400)
RBC: 4.55 MIL/uL (ref 3.87–5.11)
RDW: 15.2 % (ref 11.5–15.5)
WBC: 6 10*3/uL (ref 4.0–10.5)

## 2016-08-20 LAB — COMPREHENSIVE METABOLIC PANEL
ALBUMIN: 3.8 g/dL (ref 3.5–5.0)
ALT: 22 U/L (ref 14–54)
ANION GAP: 7 (ref 5–15)
AST: 25 U/L (ref 15–41)
Alkaline Phosphatase: 49 U/L (ref 38–126)
BILIRUBIN TOTAL: 0.3 mg/dL (ref 0.3–1.2)
BUN: 11 mg/dL (ref 6–20)
CALCIUM: 9 mg/dL (ref 8.9–10.3)
CO2: 26 mmol/L (ref 22–32)
Chloride: 104 mmol/L (ref 101–111)
Creatinine, Ser: 0.94 mg/dL (ref 0.44–1.00)
GFR calc non Af Amer: >60 mL/min (ref >60)
GLUCOSE: 137 mg/dL — AB (ref 65–99)
Potassium: 3.5 mmol/L (ref 3.5–5.1)
Sodium: 137 mmol/L (ref 135–145)
Total Protein: 7.3 g/dL (ref 6.5–8.1)

## 2016-08-20 LAB — URINALYSIS, ROUTINE W REFLEX MICROSCOPIC
BILIRUBIN URINE: NEGATIVE
GLUCOSE, UA: NEGATIVE mg/dL
Hgb urine dipstick: NEGATIVE
KETONES UR: NEGATIVE mg/dL
LEUKOCYTES UA: NEGATIVE
Nitrite: NEGATIVE
PROTEIN: NEGATIVE mg/dL
Specific Gravity, Urine: 1.02 (ref 1.005–1.030)
pH: 5.5 (ref 5.0–8.0)

## 2016-08-20 LAB — POC OCCULT BLOOD, ED: Fecal Occult Bld: NEGATIVE

## 2016-08-20 LAB — PREGNANCY, URINE: Preg Test, Ur: NEGATIVE

## 2016-08-20 MED ORDER — MORPHINE SULFATE (PF) 4 MG/ML IV SOLN
4.0000 mg | Freq: Once | INTRAVENOUS | Status: AC
  Administered 2016-08-20: 4 mg via INTRAVENOUS
  Filled 2016-08-20: qty 1

## 2016-08-20 MED ORDER — IOPAMIDOL (ISOVUE-300) INJECTION 61%
INTRAVENOUS | Status: AC
  Administered 2016-08-20: 30 mL
  Filled 2016-08-20: qty 30

## 2016-08-20 MED ORDER — HYDROCODONE-ACETAMINOPHEN 5-325 MG PO TABS: 1.0000 | ORAL_TABLET | ORAL | 0 refills | Status: DC | PRN

## 2016-08-20 MED ORDER — IOPAMIDOL (ISOVUE-300) INJECTION 61%
125.0000 mL | Freq: Once | INTRAVENOUS | Status: AC | PRN
  Administered 2016-08-20: 125 mL via INTRAVENOUS

## 2016-08-20 MED ORDER — ONDANSETRON HCL 4 MG/2ML IJ SOLN
4.0000 mg | Freq: Once | INTRAMUSCULAR | Status: AC
  Administered 2016-08-20: 4 mg via INTRAVENOUS
  Filled 2016-08-20: qty 2

## 2016-08-20 NOTE — Discharge Instructions (Signed)
As discussed, your lab tests and CT scan are stable today.  The next step needs to be discussing a repeat colonoscopy with dr. Oneida Alar since you are due for your next screening.  Please call her to arrange this.  In the interim,  continue using your Protonix to protect your stomach.

## 2016-08-20 NOTE — Telephone Encounter (Signed)
ok 

## 2016-08-20 NOTE — ED Notes (Signed)
Pt taken to ct 

## 2016-08-20 NOTE — ED Notes (Signed)
Pt returned from ct

## 2016-08-20 NOTE — ED Provider Notes (Signed)
[facility] DEPT Provider Note   CSN: [id number] Arrival date & time: 08/20/16  [id number]     History   Chief Complaint Chief Complaint  Patient presents with  . Rectal Bleeding    HPI [name] is a 50 y.o. female with past medical history outlined below, most significant for GERD which has been well controlled on Protonix, h/o colon polyps and internal hemorrhoids, last colonoscopy in 2008 with a a strong family history of colon cancer presenting with rectal bleeding with loose stools.  She endorses seeing bright red blood in the toilet bowl 2 week ago with a soft to diarrheal stool x1,  Then no further symptoms until yesterday, having 2 episodes of bloody stools, also very soft but not purely passage of watery stools. the last episode occurred 12 hours ago.  She describes a lot of bright red blood both in the bowl and on the toilet tissue.  She endorses lower abdominal pain since yesterday, described as crampy and constant.  She has had no nausea or vomiting.  She does report weakness since yesterday. She has found no alleviators.  The history is provided by the patient.    Past Medical History:  Diagnosis Date  . Anxiety   . Blood transfusion without reported diagnosis 1999   due to heavy menses  . Breast cancer ([place]) 05/22/2011   03/01/11, Stage 2, s/p lumpectomy, chemo/xrt  . DDD (degenerative disc disease), lumbar   . Depression 06/22/2011  . DM (diabetes mellitus) ([place]) 06/22/2011  . Genital warts   . GERD (gastroesophageal reflux disease) 06/22/2011  . Hx of adenomatous colonic polyps 10/2007   42mm sigmoid tubular adenoma, FH colon cancer, mother in mid-50s  . Hypercholesterolemia 06/22/2011  . Hypertension 06/22/2011  . 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 Iron deficiency anemia 03/25/2015  . Obesity 06/22/2011  . Shortness of breath   . 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 Vaginal bleeding 03/08/2014     Patient Active Problem List   Diagnosis Date Noted  . Dehydration 12/15/2015  . Orthostatic dizziness 12/15/2015  . Nausea vomiting and diarrhea 12/15/2015  . Iron deficiency anemia 03/25/2015  . Excessive or frequent menstruation 04/08/2014  . Dysmenorrhea 04/08/2014  . H/O adenomatous polyp of colon 01/24/2012  . FH: colon cancer 01/24/2012  . Esophageal dysphagia 01/24/2012  . Chronic diarrhea 01/24/2012  . Depression 06/22/2011  . GERD (gastroesophageal reflux disease) 06/22/2011  . DM (diabetes mellitus) ([place]) 06/22/2011  . Hypercholesterolemia 06/22/2011  . Hypertension 06/22/2011  . Obesity 06/22/2011  . 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 back surg x2    . CHOLECYSTECTOMY    . COLONOSCOPY  11/03/07   4-mm sessile polyp removed/small internal hemorrhoids/tubular adenoma, random colon bx negative for microscopic colitis  . DILATATION & CURETTAGE/HYSTEROSCOPY WITH MYOSURE N/A 02/10/2015   Procedure: DILATATION & CURETTAGE/HYSTEROSCOPY WITH MYOSURE;  Surgeon: Nunzio Cobbs, MD;  Location: [facility];  Service: Gynecology;  Laterality: N/A;  . MASTECTOMY PARTIAL / LUMPECTOMY     rt.  . MM BREAST STEREO BX*L*R/S     rt.  Marland Kitchen PORT-A-CATH REMOVAL  05/26/2012   Procedure: REMOVAL PORT-A-CATH;  Surgeon: Jamesetta So, MD;  Location: [facility];  Service: General;  Laterality: N/A;  Minor Room  . PORTACATH PLACEMENT      OB History    Gravida  Para Term Preterm AB Living   4 3 0 0 1 3   SAB TAB Ectopic Multiple Live Births   0 0 0 0 3       Home Medications    Prior to Admission medications   Medication Sig Start Date End Date Taking? Authorizing Provider  albuterol (PROVENTIL HFA;VENTOLIN HFA) 108 (90 Base) MCG/ACT inhaler Inhale 2 puffs into the lungs every 6 (six) hours as needed for wheezing or shortness of breath.   Yes Historical Provider, MD  ALPRAZolam Duanne Moron) 0.5 MG tablet Take  0.5 mg by mouth 5 (five) times daily as needed for anxiety.  08/20/14  Yes Historical Provider, MD  atorvastatin (LIPITOR) 10 MG tablet TAKE ONE TABLET BY MOUTH ONCE DAILY 08/13/16  Yes Mikey Kirschner, MD  citalopram (CELEXA) 40 MG tablet Take 1 tablet (40 mg total) by mouth daily. 01/28/15  Yes Mikey Kirschner, MD  lisinopril-hydrochlorothiazide (PRINZIDE,ZESTORETIC) 10-12.5 MG tablet Take 1 tablet by mouth daily. 05/15/16  Yes Mikey Kirschner, MD  metFORMIN (GLUCOPHAGE) 500 MG tablet Take 1 tablet (500 mg total) by mouth 2 (two) times daily. 05/15/16  Yes Mikey Kirschner, MD  ondansetron (ZOFRAN ODT) 8 MG disintegrating tablet Take 1 tablet (8 mg total) by mouth 3 (three) times daily as needed for nausea or vomiting. 08/10/16  Yes Kathyrn Drown, MD  pantoprazole (PROTONIX) 40 MG tablet Take 1 tablet (40 mg total) by mouth daily. For acid reflux 05/15/16  Yes Mikey Kirschner, MD  azithromycin (ZITHROMAX Z-PAK) 250 MG tablet Take 1 tablet (250 mg total) by mouth daily. 07/03/16   Isla Pence, MD  HYDROcodone-acetaminophen (NORCO/VICODIN) 5-325 MG tablet Take 1 tablet by mouth every 4 (four) hours as needed. 08/20/16   Evalee Jefferson, PA-C  promethazine (PHENERGAN) 25 MG tablet Take 1 tablet (25 mg total) by mouth 2 (two) times daily as needed for nausea or vomiting. 08/10/16   Kathyrn Drown, MD    Family History Family History  Problem Relation Age of Onset  . Colon cancer Mother     mid-50s, died of metastatic disease  . Cancer Mother     liver  . Coronary artery disease Mother   . Diabetes type I Mother   . Coronary artery disease Father   . Diabetes Father   . Diabetes type I Father   . Hypertension Father   . Bipolar disorder Sister   . Coronary artery disease Brother   . Hypertension Brother   . Hypertension Maternal Grandmother   . Hyperlipidemia Maternal Grandmother   . Stroke Maternal Grandmother   . Hypertension Maternal Grandfather   . Diabetes Maternal Grandfather   .  Diabetes Paternal Grandmother   . Heart disease Paternal Grandfather     Social History Social History  Substance Use Topics  . Smoking status: Never Smoker  . Smokeless tobacco: Never Used  . Alcohol use No     Allergies   Pravastatin   Review of Systems Review of Systems  Constitutional: Negative for fever.  HENT: Negative for congestion and sore throat.   Eyes: Negative.   Respiratory: Negative for chest tightness and shortness of breath.   Cardiovascular: Negative for chest pain.  Gastrointestinal: Positive for abdominal pain and blood in stool. Negative for nausea and vomiting.  Genitourinary: Negative.   Musculoskeletal: Negative for arthralgias, joint swelling and neck pain.  Skin: Negative.  Negative for rash and wound.  Neurological: Positive for weakness. Negative for dizziness, light-headedness, numbness and headaches.  Psychiatric/Behavioral: Negative.      Physical Exam Updated Vital Signs BP 101/70 (BP Location: Right Arm)   Pulse 80   Temp 97.5 F (36.4 C) (Oral)   Resp 18   Ht 5\' 4"  (1.626 m)   Wt 108.9 kg   LMP 02/19/2016 (Approximate)   SpO2 99%   BMI 41.20 kg/m   Physical Exam  Constitutional: She appears well-developed and well-nourished.  HENT:  Head: Normocephalic and atraumatic.  Eyes: Conjunctivae are normal.  Neck: Normal range of motion.  Cardiovascular: Normal rate, regular rhythm, normal heart sounds and intact distal pulses.   Pulmonary/Chest: Effort normal and breath sounds normal. She has no wheezes.  Abdominal: Soft. Bowel sounds are normal. There is tenderness in the right lower quadrant, periumbilical area and left lower quadrant. There is no rigidity, no rebound and no guarding.  Genitourinary: Rectal exam shows no external hemorrhoid, no fissure and guaiac negative stool.  Genitourinary Comments: Unable to appreciate internal hemorrhoid.  Musculoskeletal: Normal range of motion.  Neurological: She is alert.  Skin: Skin is  warm and dry.  Psychiatric: She has a normal mood and affect.  Nursing note and vitals reviewed.    ED Treatments / Results  Labs  Results for orders placed or performed during the hospital encounter of 08/20/16  CBC with Differential  Result Value Ref Range   WBC 6.0 4.0 - 10.5 K/uL   RBC 4.55 3.87 - 5.11 MIL/uL   Hemoglobin 12.9 12.0 - 15.0 g/dL   HCT 40.7 36.0 - 46.0 %   MCV 89.5 78.0 - 100.0 fL   MCH 28.4 26.0 - 34.0 pg   MCHC 31.7 30.0 - 36.0 g/dL   RDW 15.2 11.5 - 15.5 %   Platelets 194 150 - 400 K/uL   Neutrophils Relative % 54 %   Neutro Abs 3.3 1.7 - 7.7 K/uL   Lymphocytes Relative 36 %   Lymphs Abs 2.2 0.7 - 4.0 K/uL   Monocytes Relative 6 %   Monocytes Absolute 0.3 0.1 - 1.0 K/uL   Eosinophils Relative 3 %   Eosinophils Absolute 0.2 0.0 - 0.7 K/uL   Basophils Relative 1 %   Basophils Absolute 0.0 0.0 - 0.1 K/uL  Comprehensive metabolic panel  Result Value Ref Range   Sodium 137 135 - 145 mmol/L   Potassium 3.5 3.5 - 5.1 mmol/L   Chloride 104 101 - 111 mmol/L   CO2 26 22 - 32 mmol/L   Glucose, Bld 137 (H) 65 - 99 mg/dL   BUN 11 6 - 20 mg/dL   Creatinine, Ser 0.94 0.44 - 1.00 mg/dL   Calcium 9.0 8.9 - 10.3 mg/dL   Total Protein 7.3 6.5 - 8.1 g/dL   Albumin 3.8 3.5 - 5.0 g/dL   AST 25 15 - 41 U/L   ALT 22 14 - 54 U/L   Alkaline Phosphatase 49 38 - 126 U/L   Total Bilirubin 0.3 0.3 - 1.2 mg/dL   GFR calc non Af Amer >60 >60 mL/min   GFR calc Af Amer >60 >60 mL/min   Anion gap 7 5 - 15  Urinalysis, Routine w reflex microscopic (not at [facility])  Result Value Ref Range   Color, Urine YELLOW YELLOW   APPearance CLEAR CLEAR   Specific Gravity, Urine 1.020 1.005 - 1.030   pH 5.5 5.0 - 8.0   Glucose, UA NEGATIVE NEGATIVE mg/dL   Hgb urine dipstick NEGATIVE NEGATIVE   Bilirubin Urine NEGATIVE NEGATIVE   Ketones, ur NEGATIVE  NEGATIVE mg/dL   Protein, ur NEGATIVE NEGATIVE mg/dL   Nitrite NEGATIVE NEGATIVE   Leukocytes, UA NEGATIVE NEGATIVE  Pregnancy, urine   Result Value Ref Range   Preg Test, Ur NEGATIVE NEGATIVE  POC occult blood, ED  Result Value Ref Range   Fecal Occult Bld NEGATIVE NEGATIVE       EKG  EKG Interpretation None       Radiology Ct Abdomen Pelvis W Contrast  Result Date: 08/20/2016 CLINICAL DATA:  Upper abdominal pain beginning yesterday after a bowel movement. Bright red blood in stool. Similar episode 2 weeks ago. EXAM: CT ABDOMEN AND PELVIS WITH CONTRAST TECHNIQUE: Multidetector CT imaging of the abdomen and pelvis was performed using the standard protocol following bolus administration of intravenous contrast. CONTRAST:  125 mL Isovue-300 COMPARISON:  08/11/2012 FINDINGS: Lower chest: Visualized lung bases are clear. Hepatobiliary: Diffusely decreased attenuation of the liver consistent with steatosis. Mild hepatomegaly. Minimal extrahepatic biliary dilatation, within normal limits following cholecystectomy. Pancreas: Unremarkable. Spleen: Unremarkable. Adrenals/Urinary Tract: Punctate bilateral renal calculi without hydronephrosis or renal mass. No ureteral calculi. Unremarkable adrenal glands. Bladder is largely decompressed and contains a tiny locule of gas which could be secondary to recent catheterization. Stomach/Bowel: The stomach is within normal limits. Small duodenal diverticula without surrounding inflammatory change. Oral contrast in multiple nondilated loops of small bowel without evidence of obstruction. The colon is decompressed. The appendix is not identified, however no inflammatory changes are seen in the right lower quadrant. Vascular/Lymphatic: Mild abdominal aortic atherosclerosis without aneurysm. No enlarged lymph nodes are identified. Reproductive: Uterus and bilateral adnexa are unremarkable. Other: New intraperitoneal free fluid. No abdominal wall mass or hernia. Musculoskeletal: Osseous fusion at L5-S1. IMPRESSION: 1. Bilateral nonobstructing renal calculi. 2. Hepatic steatosis. 3. Aortic  atherosclerosis. Electronically Signed   By: Logan Bores M.D.   On: 08/20/2016 12:56    Procedures Procedures (including critical care time)  Medications Ordered in ED Medications  iopamidol (ISOVUE-300) 61 % injection (30 mLs  Contrast Given 08/20/16 1144)  morphine 4 MG/ML injection 4 mg (4 mg Intravenous Given 08/20/16 1218)  ondansetron (ZOFRAN) injection 4 mg (4 mg Intravenous Given 08/20/16 1218)  iopamidol (ISOVUE-300) 61 % injection 125 mL (125 mLs Intravenous Contrast Given 08/20/16 1228)     Initial Impression / Assessment and Plan / ED Course  I have reviewed the triage vital signs and the nursing notes.  Pertinent labs & imaging results that were available during my care of the patient were reviewed by me and considered in my medical decision making (see chart for details).  Clinical Course    Labs stable with no current active bleeding.  Sx suggesting rectal outlet source, ? Internal hemorrhoid.  She was advised to f/u with Dr. Oneida Alar as she was told 8 years ago at her last colonoscopy that she needed q 5 yr scopes given fam hx.  She has arranged an appt with her on Oct 18 (called while here).  In the interim advised recheck here for any worsened or more frequent episodes of bleeding.    Final Clinical Impressions(s) / ED Diagnoses   Final diagnoses:  Rectal bleeding    New Prescriptions Discharge Medication List as of 08/20/2016  2:00 PM       Evalee Jefferson, PA-C 08/20/16 1653    Elnora Morrison, MD 08/21/16 1524

## 2016-08-20 NOTE — ED Triage Notes (Signed)
Pt c/o upper abd pain that started yesterday after having a bm and noticing bright red blood in stool.  Pt says similar episode happened 2 weeks ago.

## 2016-08-20 NOTE — Telephone Encounter (Signed)
Pt's mother passed of colon cancer. Pt has had two episodes within a month of seeing a lot of blood in her stool. Last time was yesterday. Having a burning abdominal pain since yesterday. Consult with dr Richardson Landry. Advised to go to ED today. Pt states she will be going to [facility] ED.

## 2016-09-06 ENCOUNTER — Encounter: Payer: Self-pay | Admitting: Gastroenterology

## 2016-09-06 ENCOUNTER — Ambulatory Visit (INDEPENDENT_AMBULATORY_CARE_PROVIDER_SITE_OTHER): Payer: BLUE CROSS/BLUE SHIELD | Admitting: Gastroenterology

## 2016-09-06 VITALS — BP 125/76 | HR 96 | Temp 97.8°F | Ht 64.0 in | Wt 251.6 lb

## 2016-09-06 DIAGNOSIS — Z86010 Personal history of colonic polyps: Secondary | ICD-10-CM

## 2016-09-06 DIAGNOSIS — K625 Hemorrhage of anus and rectum: Secondary | ICD-10-CM | POA: Insufficient documentation

## 2016-09-06 DIAGNOSIS — R109 Unspecified abdominal pain: Secondary | ICD-10-CM | POA: Insufficient documentation

## 2016-09-06 DIAGNOSIS — Z800 Family history of malignant neoplasm of digestive organs: Secondary | ICD-10-CM | POA: Diagnosis not present

## 2016-09-06 DIAGNOSIS — K760 Fatty (change of) liver, not elsewhere classified: Secondary | ICD-10-CM

## 2016-09-06 MED ORDER — HYDROCODONE-ACETAMINOPHEN 5-325 MG PO TABS: 1.0000 | ORAL_TABLET | Freq: Four times a day (QID) | ORAL | 0 refills | Status: DC | PRN

## 2016-09-06 NOTE — Progress Notes (Addendum)
REVIEWED.TCS OCT 30 BY DR. Gala Romney DUE TO INABILITY TO SCHEDULE SOONER WITH DR. FIELDS.  Primary Care Physician:  Mickie Hillier, MD  Primary Gastroenterologist:  Barney Drain, MD AND DR. Gala Romney.   Chief Complaint  Patient presents with  . Rectal Bleeding    HPI:  [name] is a 50 y.o. female here for further evaluation of rectal bleeding. Recently seen in ER for this. No appreciable hemorrhoids on DRE. Known hemorrhoids at time of TCS in 2013.   Several episodes of rectal bleeding some blood clots the last time. First time 3 months ago. Second time on 08/19/16. And then yesterday. Loose stools chronically but four to five months, wosre than usual. Odor worse than usual. A lot of abd cramping with it. No melena. Stomach bloating /burning. No ASA or ASA powders. Complains of fatigue. No ugi symptoms. No new medications.   FH significant in mother who succumbed to colon cancer at age 21.    Patient complains of inadequate conscious sedation with last TCS.   Current Outpatient Prescriptions  Medication Sig Dispense Refill  . albuterol (PROVENTIL HFA;VENTOLIN HFA) 108 (90 Base) MCG/ACT inhaler Inhale 2 puffs into the lungs every 6 (six) hours as needed for wheezing or shortness of breath.    . ALPRAZolam (XANAX) 0.5 MG tablet Take 0.5 mg by mouth 5 (five) times daily as needed for anxiety.     Marland Kitchen atorvastatin (LIPITOR) 10 MG tablet TAKE ONE TABLET BY MOUTH ONCE DAILY 30 tablet 2  . citalopram (CELEXA) 40 MG tablet Take 1 tablet (40 mg total) by mouth daily. 90 tablet 1  . HYDROcodone-acetaminophen (NORCO/VICODIN) 5-325 MG tablet Take 1 tablet by mouth every 4 (four) hours as needed. 20 tablet 0  . lisinopril-hydrochlorothiazide (PRINZIDE,ZESTORETIC) 10-12.5 MG tablet Take 1 tablet by mouth daily. 90 tablet 1  . metFORMIN (GLUCOPHAGE) 500 MG tablet Take 1 tablet (500 mg total) by mouth 2 (two) times daily. 180 tablet 1  . ondansetron (ZOFRAN ODT) 8 MG disintegrating tablet Take 1  tablet (8 mg total) by mouth 3 (three) times daily as needed for nausea or vomiting. 20 tablet 0  . pantoprazole (PROTONIX) 40 MG tablet Take 1 tablet (40 mg total) by mouth daily. For acid reflux 90 tablet 1  . promethazine (PHENERGAN) 25 MG tablet Take 1 tablet (25 mg total) by mouth 2 (two) times daily as needed for nausea or vomiting. 12 tablet 2   No current facility-administered medications for this visit.     Allergies as of 09/06/2016 - Review Complete 09/06/2016  Allergen Reaction Noted  . Pravastatin Other (See Comments) 08/02/2015    Past Medical History:  Diagnosis Date  . Anxiety   . Blood transfusion without reported diagnosis 1999   due to heavy menses  . Breast cancer ([place]) 05/22/2011   03/01/11, Stage 2, s/p lumpectomy, chemo/xrt  . DDD (degenerative disc disease), lumbar   . Depression 06/22/2011  . DM (diabetes mellitus) ([place]) 06/22/2011  . Genital warts   . GERD (gastroesophageal reflux disease) 06/22/2011  . Hx of adenomatous colonic polyps 10/2007   29mm sigmoid tubular adenoma, FH colon cancer, mother in mid-50s  . Hypercholesterolemia 06/22/2011  . Hypertension 06/22/2011  . 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 Iron deficiency anemia 03/25/2015  . Obesity 06/22/2011  . Shortness of breath   . 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 Vaginal bleeding 03/08/2014    Past Surgical History:  Procedure Laterality Date  . back surg x2    . CHOLECYSTECTOMY    . COLONOSCOPY  11/03/07   4-mm sessile polyp removed/small internal hemorrhoids/tubular adenoma, random colon bx negative for microscopic colitis  . DILATATION & CURETTAGE/HYSTEROSCOPY WITH MYOSURE N/A 02/10/2015   Procedure: DILATATION & CURETTAGE/HYSTEROSCOPY WITH MYOSURE;  Surgeon: Nunzio Cobbs, MD;  Location: [facility];  Service: Gynecology;  Laterality: N/A;  . MASTECTOMY PARTIAL / LUMPECTOMY     rt.  . MM BREAST STEREO BX*L*R/S     rt.  Marland Kitchen  PORT-A-CATH REMOVAL  05/26/2012   Procedure: REMOVAL PORT-A-CATH;  Surgeon: Jamesetta So, MD;  Location: [facility];  Service: General;  Laterality: N/A;  Minor Room  . PORTACATH PLACEMENT      Family History  Problem Relation Age of Onset  . Colon cancer Mother     mid-50s, died of metastatic disease  . Cancer Mother     liver  . Coronary artery disease Mother   . Diabetes type I Mother   . Coronary artery disease Father   . Diabetes Father   . Diabetes type I Father   . Hypertension Father   . Bipolar disorder Sister   . Coronary artery disease Brother   . Hypertension Brother   . Hypertension Maternal Grandmother   . Hyperlipidemia Maternal Grandmother   . Stroke Maternal Grandmother   . Hypertension Maternal Grandfather   . Diabetes Maternal Grandfather   . Diabetes Paternal Grandmother   . Heart disease Paternal Grandfather     Social History   Social History  . Marital status: Significant Other    Spouse name: N/A  . Number of children: 3  . Years of education: N/A   Occupational History  . child care [employer]   Social History Main Topics  . Smoking status: Never Smoker  . Smokeless tobacco: Never Used  . Alcohol use No  . Drug use: No  . Sexual activity: Yes    Partners: Male    Birth control/ protection: None   Other Topics Concern  . Not on file   Social History Narrative  . No narrative on file      ROS:  General: Negative for anorexia, weight loss, fever, chills, fatigue, weakness. Eyes: Negative for vision changes.  ENT: Negative for hoarseness, difficulty swallowing , nasal congestion. CV: Negative for chest pain, angina, palpitations, dyspnea on exertion, peripheral edema.  Respiratory: Negative for dyspnea at rest, dyspnea on exertion, cough, sputum, wheezing.  GI: See history of present illness. GU:  Negative for dysuria, hematuria, urinary incontinence, urinary frequency, nocturnal urination.  MS: Negative for joint pain, low  back pain.  Derm: Negative for rash or itching.  Neuro: Negative for weakness, abnormal sensation, seizure, frequent headaches, memory loss, confusion.  Psych: Negative for anxiety, depression, suicidal ideation, hallucinations.  Endo: Negative for unusual weight change.  Heme: Negative for bruising or bleeding. Allergy: Negative for rash or hives.    Physical Examination:  BP 125/76   Pulse 96   Temp 97.8 F (36.6 C) (Oral)   Ht 5\' 4"  (1.626 m)   Wt 251 lb 9.6 oz (114.1 kg)   LMP 02/19/2016 (Approximate)   BMI 43.19 kg/m    General: Well-nourished, well-developed in no acute distress.  Head: Normocephalic, atraumatic.   Eyes: Conjunctiva pink, no icterus. Mouth: Oropharyngeal mucosa moist and pink , no lesions erythema or exudate. Neck: Supple without thyromegaly, masses, or lymphadenopathy.  Lungs: Clear to auscultation bilaterally.  Heart: Regular rate and rhythm, no murmurs rubs or gallops.  Abdomen: Bowel sounds are normal, mild diffuse tenderness, nondistended, no hepatosplenomegaly or masses, no abdominal bruits or    hernia , no rebound or guarding.   Rectal: not performed Extremities: No lower extremity edema. No clubbing or deformities.  Neuro: Alert and oriented x 4 , grossly normal neurologically.  Skin: Warm and dry, no rash or jaundice.   Psych: Alert and cooperative, normal mood and affect.  Labs: Lab Results  Component Value Date   CREATININE 0.94 08/20/2016   BUN 11 08/20/2016   NA 137 08/20/2016   K 3.5 08/20/2016   CL 104 08/20/2016   CO2 26 08/20/2016   Lab Results  Component Value Date   ALT 22 08/20/2016   AST 25 08/20/2016   ALKPHOS 49 08/20/2016   BILITOT 0.3 08/20/2016   Lab Results  Component Value Date   WBC 6.0 08/20/2016   HGB 12.9 08/20/2016   HCT 40.7 08/20/2016   MCV 89.5 08/20/2016   PLT 194 08/20/2016   POC occult blood negative.   Imaging Studies: Ct Abdomen Pelvis W Contrast  Result Date: 08/20/2016 CLINICAL DATA:   Upper abdominal pain beginning yesterday after a bowel movement. Bright red blood in stool. Similar episode 2 weeks ago. EXAM: CT ABDOMEN AND PELVIS WITH CONTRAST TECHNIQUE: Multidetector CT imaging of the abdomen and pelvis was performed using the standard protocol following bolus administration of intravenous contrast. CONTRAST:  125 mL Isovue-300 COMPARISON:  08/11/2012 FINDINGS: Lower chest: Visualized lung bases are clear. Hepatobiliary: Diffusely decreased attenuation of the liver consistent with steatosis. Mild hepatomegaly. Minimal extrahepatic biliary dilatation, within normal limits following cholecystectomy. Pancreas: Unremarkable. Spleen: Unremarkable. Adrenals/Urinary Tract: Punctate bilateral renal calculi without hydronephrosis or renal mass. No ureteral calculi. Unremarkable adrenal glands. Bladder is largely decompressed and contains a tiny locule of gas which could be secondary to recent catheterization. Stomach/Bowel: The stomach is within normal limits. Small duodenal diverticula without surrounding inflammatory change. Oral contrast in multiple nondilated loops of small bowel without evidence of obstruction. The colon is decompressed. The appendix is not identified, however no inflammatory changes are seen in the right lower quadrant. Vascular/Lymphatic: Mild abdominal aortic atherosclerosis without aneurysm. No enlarged lymph nodes are identified. Reproductive: Uterus and bilateral adnexa are unremarkable. Other: New intraperitoneal free fluid. No abdominal wall mass or hernia. Musculoskeletal: Osseous fusion at L5-S1. IMPRESSION: 1. Bilateral nonobstructing renal calculi. 2. Hepatic steatosis. 3. Aortic atherosclerosis. Electronically Signed   By: Logan Bores M.D.   On: 08/20/2016 12:56

## 2016-09-06 NOTE — Patient Instructions (Signed)
Colonoscopy as scheduled. See separate instructions.  

## 2016-09-06 NOTE — Assessment & Plan Note (Signed)
50 y/o female with several episodes of rectal bleeding with passage of small clots in setting of increased loose stools, abd cramping. FH significant for CRC at young age in her mother. Patient's last colonoscopy was 4 years ago. Recommend colonoscopy at this time for further evaluation. Recommend deep sedation in or given polypharmacy and reported inadequate conscious sedation.  I have discussed the risks, alternatives, benefits with regards to but not limited to the risk of reaction to medication, bleeding, infection, perforation and the patient is agreeable to proceed. Written consent to be obtained.  DDx: benign anorectal source in setting of IBS, IBD, malignancy.   Patient also with fatty liver appearance on CT. LFTs are normal.  Instructions for fatty liver: Recommend 1-2# weight loss per week until ideal body weight through exercise & diet. Low fat/cholesterol diet.   Avoid sweets, sodas, fruit juices, sweetened beverages like tea, etc. Gradually increase exercise from 15 min daily up to 1 hr per day 5 days/week. Limit alcohol use.

## 2016-09-07 ENCOUNTER — Telehealth: Payer: Self-pay

## 2016-09-07 NOTE — Telephone Encounter (Signed)
Called and informed pt we will have to talk with office manager Monday about setting a date for her TCS and call her back.

## 2016-09-10 ENCOUNTER — Other Ambulatory Visit: Payer: Self-pay

## 2016-09-10 ENCOUNTER — Telehealth: Payer: Self-pay

## 2016-09-10 DIAGNOSIS — Z860100 Personal history of colon polyps, unspecified: Secondary | ICD-10-CM

## 2016-09-10 DIAGNOSIS — Z800 Family history of malignant neoplasm of digestive organs: Secondary | ICD-10-CM

## 2016-09-10 DIAGNOSIS — K625 Hemorrhage of anus and rectum: Secondary | ICD-10-CM

## 2016-09-10 MED ORDER — PEG 3350-KCL-NA BICARB-NACL 420 G PO SOLR: 4000.0000 mL | ORAL | 0 refills | Status: DC

## 2016-09-10 NOTE — Telephone Encounter (Signed)
Pt called, set-up TCS in OR with RMR 09/17/16 AT 9:00 am. Instructions mailed. Pre-op appt 09/12/16 at 9:00am but pt wanted to change time. Gave pt phone number for scheduler. Advised pt to take Metformin 500 mg am only on 09/16/16 and none on morning of 09/17/16.

## 2016-09-10 NOTE — Telephone Encounter (Signed)
Called pt and she is willing to have RMR do TCS if needed, she just wants it done as soon as possible. Message sent to RMR.

## 2016-09-10 NOTE — Telephone Encounter (Signed)
LMOM for pt to return call. 

## 2016-09-12 ENCOUNTER — Other Ambulatory Visit (HOSPITAL_COMMUNITY): Payer: BLUE CROSS/BLUE SHIELD

## 2016-09-13 NOTE — Patient Instructions (Signed)
Your procedure is scheduled on: 09/17/2016  Report to [name] at   7:30  AM.  Call this number if you have problems the morning of surgery: [phone]   Remember:   Do not drink or eat food:After Midnight.  :  Take these medicines the morning of surgery with A SIP OF WATER: Xanax, Celexa, lisinopril, protonix and phenergan if needed.  Use albuterol inhaler   Do not wear jewelry, make-up or nail polish.  Do not wear lotions, powders, or perfumes. You may wear deodorant.  .  Do not bring valuables to the hospital.  Contacts, dentures or bridgework may not be worn into surgery.  Leave suitcase in the car. After surgery it may be brought to your room.  For patients admitted to the hospital, checkout time is 11:00 AM the day of discharge.   Patients discharged the day of surgery will not be allowed to drive home.    Colonoscopy, Care After Refer to this sheet in the next few weeks. These instructions provide you with information on caring for yourself after your procedure. Your health care provider may also give you more specific instructions. Your treatment has been planned according to current medical practices, but problems sometimes occur. Call your health care provider if you have any problems or questions after your procedure. WHAT TO EXPECT AFTER THE PROCEDURE  After your procedure, it is typical to have the following:  A small amount of blood in your stool.  Moderate amounts of gas and mild abdominal cramping or bloating. HOME CARE INSTRUCTIONS  Do not drive, operate machinery, or sign important documents for 24 hours.  You may shower and resume your regular physical activities, but move at a slower pace for the first 24 hours.  Take frequent rest periods for the first 24 hours.  Walk around or put a warm pack on your abdomen to help reduce abdominal cramping and bloating.  Drink enough fluids to keep your urine clear or pale yellow.  You may resume your normal diet  as instructed by your health care provider. Avoid heavy or fried foods that are hard to digest.  Avoid drinking alcohol for 24 hours or as instructed by your health care provider.  Only take over-the-counter or prescription medicines as directed by your health care provider.  If a tissue sample (biopsy) was taken during your procedure:  Do not take aspirin or blood thinners for 7 days, or as instructed by your health care provider.  Do not drink alcohol for 7 days, or as instructed by your health care provider.  Eat soft foods for the first 24 hours. SEEK MEDICAL CARE IF: You have persistent spotting of blood in your stool 2-3 days after the procedure. SEEK IMMEDIATE MEDICAL CARE IF:  You have more than a small spotting of blood in your stool.  You pass large blood clots in your stool.  Your abdomen is swollen (distended).  You have nausea or vomiting.  You have a fever.  You have increasing abdominal pain that is not relieved with medicine.   This information is not intended to replace advice given to you by your health care provider. Make sure you discuss any questions you have with your health care provider.   Document Released: 06/19/2004 Document Revised: 08/26/2013 Document Reviewed: 07/13/2013 Elsevier Interactive Patient Education 2016 [place].  Anesthesia, Adult, Care After Refer to this sheet in the next few weeks. These instructions provide you with information on caring for yourself after your procedure.  Your health care provider may also give you more specific instructions. Your treatment has been planned according to current medical practices, but problems sometimes occur. Call your health care provider if you have any problems or questions after your procedure. WHAT TO EXPECT AFTER THE PROCEDURE After the procedure, it is typical to experience:  Sleepiness.  Nausea and vomiting. HOME CARE INSTRUCTIONS  For the first 24 hours after general  anesthesia:  Have a responsible person with you.  Do not drive a car. If you are alone, do not take public transportation.  Do not drink alcohol.  Do not take medicine that has not been prescribed by your health care provider.  Do not sign important papers or make important decisions.  You may resume a normal diet and activities as directed by your health care provider.  Change bandages (dressings) as directed.  If you have questions or problems that seem related to general anesthesia, call the hospital and ask for the anesthetist or anesthesiologist on call. SEEK MEDICAL CARE IF:  You have nausea and vomiting that continue the day after anesthesia.  You develop a rash. SEEK IMMEDIATE MEDICAL CARE IF:   You have difficulty breathing.  You have chest pain.  You have any allergic problems.   This information is not intended to replace advice given to you by your health care provider. Make sure you discuss any questions you have with your health care provider.   Document Released: 02/11/2001 Document Revised: 11/26/2014 Document Reviewed: 03/05/2012 Elsevier Interactive Patient Education [employer].

## 2016-09-14 ENCOUNTER — Other Ambulatory Visit (HOSPITAL_COMMUNITY): Payer: BLUE CROSS/BLUE SHIELD

## 2016-09-14 ENCOUNTER — Encounter (HOSPITAL_COMMUNITY): Payer: Self-pay

## 2016-09-14 ENCOUNTER — Encounter (HOSPITAL_COMMUNITY)
Admission: RE | Admit: 2016-09-14 | Discharge: 2016-09-14 | Disposition: A | Discharge status: Home / Self Care | Payer: BLUE CROSS/BLUE SHIELD | Source: Ambulatory Visit | Attending: Internal Medicine | Admitting: Internal Medicine

## 2016-09-14 DIAGNOSIS — E119 Type 2 diabetes mellitus without complications: Secondary | ICD-10-CM | POA: Diagnosis not present

## 2016-09-14 DIAGNOSIS — K641 Second degree hemorrhoids: Secondary | ICD-10-CM | POA: Diagnosis not present

## 2016-09-14 DIAGNOSIS — Z6841 Body Mass Index (BMI) 40.0 and over, adult: Secondary | ICD-10-CM | POA: Diagnosis not present

## 2016-09-14 DIAGNOSIS — I10 Essential (primary) hypertension: Secondary | ICD-10-CM | POA: Diagnosis not present

## 2016-09-14 DIAGNOSIS — Z7984 Long term (current) use of oral hypoglycemic drugs: Secondary | ICD-10-CM | POA: Diagnosis not present

## 2016-09-14 DIAGNOSIS — E7800 Pure hypercholesterolemia, unspecified: Secondary | ICD-10-CM | POA: Diagnosis not present

## 2016-09-14 DIAGNOSIS — K219 Gastro-esophageal reflux disease without esophagitis: Secondary | ICD-10-CM | POA: Diagnosis not present

## 2016-09-14 DIAGNOSIS — F329 Major depressive disorder, single episode, unspecified: Secondary | ICD-10-CM | POA: Diagnosis not present

## 2016-09-14 DIAGNOSIS — Z853 Personal history of malignant neoplasm of breast: Secondary | ICD-10-CM | POA: Diagnosis not present

## 2016-09-14 DIAGNOSIS — K921 Melena: Secondary | ICD-10-CM | POA: Diagnosis present

## 2016-09-14 DIAGNOSIS — Z800 Family history of malignant neoplasm of digestive organs: Secondary | ICD-10-CM | POA: Diagnosis not present

## 2016-09-14 DIAGNOSIS — F419 Anxiety disorder, unspecified: Secondary | ICD-10-CM | POA: Diagnosis not present

## 2016-09-14 DIAGNOSIS — Z79899 Other long term (current) drug therapy: Secondary | ICD-10-CM | POA: Diagnosis not present

## 2016-09-14 DIAGNOSIS — Z86010 Personal history of colonic polyps: Secondary | ICD-10-CM | POA: Diagnosis not present

## 2016-09-14 DIAGNOSIS — D122 Benign neoplasm of ascending colon: Secondary | ICD-10-CM | POA: Diagnosis not present

## 2016-09-14 DIAGNOSIS — K5750 Diverticulosis of both small and large intestine without perforation or abscess without bleeding: Secondary | ICD-10-CM | POA: Diagnosis not present

## 2016-09-17 ENCOUNTER — Encounter (HOSPITAL_COMMUNITY): Payer: Self-pay | Admitting: *Deleted

## 2016-09-17 ENCOUNTER — Encounter (HOSPITAL_COMMUNITY)
Admission: RE | Disposition: A | Discharge status: Home / Self Care | Payer: Self-pay | Source: Ambulatory Visit | Attending: Internal Medicine

## 2016-09-17 ENCOUNTER — Ambulatory Visit (HOSPITAL_COMMUNITY)
Admission: RE | Admit: 2016-09-17 | Discharge: 2016-09-17 | Disposition: A | Discharge status: Home / Self Care | Payer: BLUE CROSS/BLUE SHIELD | Source: Ambulatory Visit | Attending: Internal Medicine | Admitting: Internal Medicine

## 2016-09-17 ENCOUNTER — Ambulatory Visit (HOSPITAL_COMMUNITY): Payer: BLUE CROSS/BLUE SHIELD | Admitting: Anesthesiology

## 2016-09-17 DIAGNOSIS — K641 Second degree hemorrhoids: Secondary | ICD-10-CM | POA: Insufficient documentation

## 2016-09-17 DIAGNOSIS — E119 Type 2 diabetes mellitus without complications: Secondary | ICD-10-CM | POA: Insufficient documentation

## 2016-09-17 DIAGNOSIS — F329 Major depressive disorder, single episode, unspecified: Secondary | ICD-10-CM | POA: Insufficient documentation

## 2016-09-17 DIAGNOSIS — Z853 Personal history of malignant neoplasm of breast: Secondary | ICD-10-CM | POA: Insufficient documentation

## 2016-09-17 DIAGNOSIS — Z800 Family history of malignant neoplasm of digestive organs: Secondary | ICD-10-CM | POA: Diagnosis not present

## 2016-09-17 DIAGNOSIS — F419 Anxiety disorder, unspecified: Secondary | ICD-10-CM | POA: Insufficient documentation

## 2016-09-17 DIAGNOSIS — Z86010 Personal history of colonic polyps: Secondary | ICD-10-CM | POA: Insufficient documentation

## 2016-09-17 DIAGNOSIS — D122 Benign neoplasm of ascending colon: Secondary | ICD-10-CM | POA: Diagnosis not present

## 2016-09-17 DIAGNOSIS — K219 Gastro-esophageal reflux disease without esophagitis: Secondary | ICD-10-CM | POA: Insufficient documentation

## 2016-09-17 DIAGNOSIS — Z6841 Body Mass Index (BMI) 40.0 and over, adult: Secondary | ICD-10-CM | POA: Insufficient documentation

## 2016-09-17 DIAGNOSIS — Z7984 Long term (current) use of oral hypoglycemic drugs: Secondary | ICD-10-CM | POA: Insufficient documentation

## 2016-09-17 DIAGNOSIS — K921 Melena: Secondary | ICD-10-CM | POA: Insufficient documentation

## 2016-09-17 DIAGNOSIS — I10 Essential (primary) hypertension: Secondary | ICD-10-CM | POA: Insufficient documentation

## 2016-09-17 DIAGNOSIS — K5750 Diverticulosis of both small and large intestine without perforation or abscess without bleeding: Secondary | ICD-10-CM | POA: Insufficient documentation

## 2016-09-17 DIAGNOSIS — Z79899 Other long term (current) drug therapy: Secondary | ICD-10-CM | POA: Insufficient documentation

## 2016-09-17 DIAGNOSIS — K625 Hemorrhage of anus and rectum: Secondary | ICD-10-CM

## 2016-09-17 DIAGNOSIS — E7800 Pure hypercholesterolemia, unspecified: Secondary | ICD-10-CM | POA: Insufficient documentation

## 2016-09-17 HISTORY — PX: POLYPECTOMY: SHX5525

## 2016-09-17 HISTORY — PX: COLONOSCOPY WITH PROPOFOL: SHX5780

## 2016-09-17 LAB — GLUCOSE, CAPILLARY
Glucose-Capillary: 122 mg/dL — ABNORMAL HIGH (ref 65–99)
Glucose-Capillary: 128 mg/dL — ABNORMAL HIGH (ref 65–99)

## 2016-09-17 SURGERY — COLONOSCOPY WITH PROPOFOL
Anesthesia: Monitor Anesthesia Care

## 2016-09-17 MED ORDER — LACTATED RINGERS IV SOLN
INTRAVENOUS | Status: DC
  Administered 2016-09-17: 09:00:00 via INTRAVENOUS

## 2016-09-17 MED ORDER — CHLORHEXIDINE GLUCONATE CLOTH 2 % EX PADS: 6.0000 | MEDICATED_PAD | Freq: Once | CUTANEOUS | Status: DC

## 2016-09-17 MED ORDER — GLYCOPYRROLATE 0.2 MG/ML IJ SOLN
INTRAMUSCULAR | Status: AC
  Filled 2016-09-17: qty 1

## 2016-09-17 MED ORDER — PROPOFOL 10 MG/ML IV BOLUS
INTRAVENOUS | Status: AC
  Filled 2016-09-17: qty 40

## 2016-09-17 MED ORDER — MIDAZOLAM HCL 2 MG/2ML IJ SOLN
1.0000 mg | INTRAMUSCULAR | Status: DC | PRN
  Administered 2016-09-17: 2 mg via INTRAVENOUS

## 2016-09-17 MED ORDER — FENTANYL CITRATE (PF) 100 MCG/2ML IJ SOLN
INTRAMUSCULAR | Status: AC
  Filled 2016-09-17: qty 2

## 2016-09-17 MED ORDER — FENTANYL CITRATE (PF) 100 MCG/2ML IJ SOLN
25.0000 ug | INTRAMUSCULAR | Status: DC | PRN
  Administered 2016-09-17: 25 ug via INTRAVENOUS

## 2016-09-17 MED ORDER — PROPOFOL 500 MG/50ML IV EMUL
INTRAVENOUS | Status: DC | PRN
  Administered 2016-09-17: 100 ug/kg/min via INTRAVENOUS
  Administered 2016-09-17: 125 ug/kg/min via INTRAVENOUS

## 2016-09-17 MED ORDER — MIDAZOLAM HCL 5 MG/5ML IJ SOLN
INTRAMUSCULAR | Status: DC | PRN
  Administered 2016-09-17: 2 mg via INTRAVENOUS

## 2016-09-17 MED ORDER — GLYCOPYRROLATE 0.2 MG/ML IJ SOLN
0.2000 mg | Freq: Once | INTRAMUSCULAR | Status: AC | PRN
  Administered 2016-09-17: 0.2 mg via INTRAVENOUS

## 2016-09-17 MED ORDER — MIDAZOLAM HCL 2 MG/2ML IJ SOLN
INTRAMUSCULAR | Status: AC
  Filled 2016-09-17: qty 2

## 2016-09-17 MED ORDER — HYDROMORPHONE HCL 1 MG/ML IJ SOLN: 0.2500 mg | INTRAMUSCULAR | Status: DC | PRN

## 2016-09-17 NOTE — Op Note (Signed)
NAME:  [name], [name]         ACCOUNT NO.:  [id number]  MEDICAL RECORD NO.:  [id number]  LOCATION:  [facility]                          FACILITY:  [facility]  PHYSICIAN:  R. Garfield Cornea, MD Casar:  [birth year]  DATE OF PROCEDURE:  09/17/2016 DATE OF DISCHARGE:                              OPERATIVE REPORT   PROCEDURE:  Ileo-colonoscopy with snare polypectomy.  INDICATIONS FOR PROCEDURE:  A 50 year old lady with personal history of colonic polyps, positive family history of colon cancer in a first-degree at a relatively young age, presents for evaluation of hematochezia. Colonoscopy is now being done.  Risks, benefits, limitations, alternatives, and imponderables have been discussed with the patient at length.  Questions were answered.  Please see the documentation in the medical record.  PROCEDURE NOTE:  Deep sedation induced by Dr. Patsey Berthold and associates.  INSTRUMENT:  Pentax video chip system.  TOTAL SERVICE TIME:  15 minutes.  FINDINGS:  Digital rectal exam revealed no abnormalities on endoscopic findings.  Prep was adequate, although there was some greasy effluent throughout the colon, which made clearly surveying the mucosa more difficult.  Examination of the rectal mucosa including retroflexed view of the anal verge and on-face view of the rectum demonstrated grade 2 hemorrhoids.  Scope was advanced from the rectosigmoid junction through the left, transverse, right colon to the appendiceal orifice, ileocecal valve, and cecum.  These structures well seen and photographed for the record.  The terminal ileum was intubated to 5 cm.  From this level, scope was slowly and cautiously withdrawn.  All previous mentioned mucosal surfaces were again seen.  The patient had sigmoid diverticula.  There was a single 5 mm pedunculated polyp in the ascending segment.  The remainder of the colonic mucosa appeared normal. The distal 5 cm of terminal ileal mucosa appeared  normal.  THERAPEUTIC/DIAGNOSTIC MANEUVERS PERFORMED:  The ascending colon polyp was cold snare removed.  The patient tolerated the procedure well and was taken to PACU in a stable condition.  IMPRESSION: 1. Grade 2 hemorrhoids, likely source of hematochezia. 2. Colonic diverticulosis. 3. Ascending colon polyp removed as described above. I suspect the patient has bled from hemorrhoids and would likely benefit from hemorrhoid banding.  RECOMMENDATIONS: 1. Follow up on pathology. 2. Office visit with [place] in 6 weeks for hemorrhoid banding. 3. Literature on diverticulosis, hemorrhoids, and colonic polyps were     provided to the patient today.     Bridgette Habermann, MD [facility]     RMR/MEDQ  D:  09/17/2016  T:  09/17/2016  Job:  [id number]  cc:   Margaretmary Eddy, M.D. Fax: [phone]

## 2016-09-17 NOTE — H&P (View-Only) (Signed)
Primary Care Physician:  Mickie Hillier, MD  Primary Gastroenterologist:  Barney Drain, MD   Chief Complaint  Patient presents with  . Rectal Bleeding    HPI:  [name] is a 50 y.o. female here for further evaluation of rectal bleeding. Recently seen in ER for this. No appreciable hemorrhoids on DRE. Known hemorrhoids at time of TCS in 2013.   Several episodes of rectal bleeding some blood clots the last time. First time 3 months ago. Second time on 08/19/16. And then yesterday. Loose stools chronically but four to five months, wosre than usual. Odor worse than usual. A lot of abd cramping with it. No melena. Stomach bloating /burning. No ASA or ASA powders. Complains of fatigue. No ugi symptoms. No new medications.   FH significant in mother who succumbed to colon cancer at age 60.    Patient complains of inadequate conscious sedation with last TCS.   Current Outpatient Prescriptions  Medication Sig Dispense Refill  . albuterol (PROVENTIL HFA;VENTOLIN HFA) 108 (90 Base) MCG/ACT inhaler Inhale 2 puffs into the lungs every 6 (six) hours as needed for wheezing or shortness of breath.    . ALPRAZolam (XANAX) 0.5 MG tablet Take 0.5 mg by mouth 5 (five) times daily as needed for anxiety.     Marland Kitchen atorvastatin (LIPITOR) 10 MG tablet TAKE ONE TABLET BY MOUTH ONCE DAILY 30 tablet 2  . citalopram (CELEXA) 40 MG tablet Take 1 tablet (40 mg total) by mouth daily. 90 tablet 1  . HYDROcodone-acetaminophen (NORCO/VICODIN) 5-325 MG tablet Take 1 tablet by mouth every 4 (four) hours as needed. 20 tablet 0  . lisinopril-hydrochlorothiazide (PRINZIDE,ZESTORETIC) 10-12.5 MG tablet Take 1 tablet by mouth daily. 90 tablet 1  . metFORMIN (GLUCOPHAGE) 500 MG tablet Take 1 tablet (500 mg total) by mouth 2 (two) times daily. 180 tablet 1  . ondansetron (ZOFRAN ODT) 8 MG disintegrating tablet Take 1 tablet (8 mg total) by mouth 3 (three) times daily as needed for nausea or vomiting. 20 tablet 0  .  pantoprazole (PROTONIX) 40 MG tablet Take 1 tablet (40 mg total) by mouth daily. For acid reflux 90 tablet 1  . promethazine (PHENERGAN) 25 MG tablet Take 1 tablet (25 mg total) by mouth 2 (two) times daily as needed for nausea or vomiting. 12 tablet 2   No current facility-administered medications for this visit.     Allergies as of 09/06/2016 - Review Complete 09/06/2016  Allergen Reaction Noted  . Pravastatin Other (See Comments) 08/02/2015    Past Medical History:  Diagnosis Date  . Anxiety   . Blood transfusion without reported diagnosis 1999   due to heavy menses  . Breast cancer ([place]) 05/22/2011   03/01/11, Stage 2, s/p lumpectomy, chemo/xrt  . DDD (degenerative disc disease), lumbar   . Depression 06/22/2011  . DM (diabetes mellitus) ([place]) 06/22/2011  . Genital warts   . GERD (gastroesophageal reflux disease) 06/22/2011  . Hx of adenomatous colonic polyps 10/2007   66mm sigmoid tubular adenoma, FH colon cancer, mother in mid-50s  . Hypercholesterolemia 06/22/2011  . Hypertension 06/22/2011  . 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 Iron deficiency anemia 03/25/2015  . Obesity 06/22/2011  . Shortness of breath   . 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 Vaginal bleeding 03/08/2014    Past Surgical History:  Procedure Laterality Date  . back surg x2    . CHOLECYSTECTOMY    . COLONOSCOPY  11/03/07   4-mm sessile polyp removed/small internal hemorrhoids/tubular adenoma, random colon bx negative for microscopic colitis  . DILATATION & CURETTAGE/HYSTEROSCOPY WITH MYOSURE N/A 02/10/2015   Procedure: DILATATION & CURETTAGE/HYSTEROSCOPY WITH MYOSURE;  Surgeon: Nunzio Cobbs, MD;  Location: [facility];  Service: Gynecology;  Laterality: N/A;  . MASTECTOMY PARTIAL / LUMPECTOMY     rt.  . MM BREAST STEREO BX*L*R/S     rt.  Marland Kitchen PORT-A-CATH REMOVAL  05/26/2012   Procedure: REMOVAL PORT-A-CATH;  Surgeon: Jamesetta So, MD;  Location: [facility];  Service: General;  Laterality: N/A;  Minor Room  . PORTACATH PLACEMENT      Family History  Problem Relation Age of Onset  . Colon cancer Mother     mid-50s, died of metastatic disease  . Cancer Mother     liver  . Coronary artery disease Mother   . Diabetes type I Mother   . Coronary artery disease Father   . Diabetes Father   . Diabetes type I Father   . Hypertension Father   . Bipolar disorder Sister   . Coronary artery disease Brother   . Hypertension Brother   . Hypertension Maternal Grandmother   . Hyperlipidemia Maternal Grandmother   . Stroke Maternal Grandmother   . Hypertension Maternal Grandfather   . Diabetes Maternal Grandfather   . Diabetes Paternal Grandmother   . Heart disease Paternal Grandfather     Social History   Social History  . Marital status: Significant Other    Spouse name: N/A  . Number of children: 3  . Years of education: N/A   Occupational History  . child care [employer]   Social History Main Topics  . Smoking status: Never Smoker  . Smokeless tobacco: Never Used  . Alcohol use No  . Drug use: No  . Sexual activity: Yes    Partners: Male    Birth control/ protection: None   Other Topics Concern  . Not on file   Social History Narrative  . No narrative on file      ROS:  General: Negative for anorexia, weight loss, fever, chills, fatigue, weakness. Eyes: Negative for vision changes.  ENT: Negative for hoarseness, difficulty swallowing , nasal congestion. CV: Negative for chest pain, angina, palpitations, dyspnea on exertion, peripheral edema.  Respiratory: Negative for dyspnea at rest, dyspnea on exertion, cough, sputum, wheezing.  GI: See history of present illness. GU:  Negative for dysuria, hematuria, urinary incontinence, urinary frequency, nocturnal urination.  MS: Negative for joint pain, low back pain.  Derm: Negative for rash or itching.  Neuro: Negative for weakness, abnormal sensation, seizure,  frequent headaches, memory loss, confusion.  Psych: Negative for anxiety, depression, suicidal ideation, hallucinations.  Endo: Negative for unusual weight change.  Heme: Negative for bruising or bleeding. Allergy: Negative for rash or hives.    Physical Examination:  BP 125/76   Pulse 96   Temp 97.8 F (36.6 C) (Oral)   Ht 5\' 4"  (1.626 m)   Wt 251 lb 9.6 oz (114.1 kg)   LMP 02/19/2016 (Approximate)   BMI 43.19 kg/m    General: Well-nourished, well-developed in no acute distress.  Head: Normocephalic, atraumatic.   Eyes: Conjunctiva pink, no icterus. Mouth: Oropharyngeal mucosa moist and pink , no lesions erythema or exudate. Neck: Supple without thyromegaly, masses, or lymphadenopathy.  Lungs: Clear to auscultation bilaterally.  Heart: Regular rate and rhythm, no murmurs rubs or gallops.  Abdomen: Bowel sounds are normal, mild diffuse  tenderness, nondistended, no hepatosplenomegaly or masses, no abdominal bruits or    hernia , no rebound or guarding.   Rectal: not performed Extremities: No lower extremity edema. No clubbing or deformities.  Neuro: Alert and oriented x 4 , grossly normal neurologically.  Skin: Warm and dry, no rash or jaundice.   Psych: Alert and cooperative, normal mood and affect.  Labs: Lab Results  Component Value Date   CREATININE 0.94 08/20/2016   BUN 11 08/20/2016   NA 137 08/20/2016   K 3.5 08/20/2016   CL 104 08/20/2016   CO2 26 08/20/2016   Lab Results  Component Value Date   ALT 22 08/20/2016   AST 25 08/20/2016   ALKPHOS 49 08/20/2016   BILITOT 0.3 08/20/2016   Lab Results  Component Value Date   WBC 6.0 08/20/2016   HGB 12.9 08/20/2016   HCT 40.7 08/20/2016   MCV 89.5 08/20/2016   PLT 194 08/20/2016   POC occult blood negative.   Imaging Studies: Ct Abdomen Pelvis W Contrast  Result Date: 08/20/2016 CLINICAL DATA:  Upper abdominal pain beginning yesterday after a bowel movement. Bright red blood in stool. Similar episode 2  weeks ago. EXAM: CT ABDOMEN AND PELVIS WITH CONTRAST TECHNIQUE: Multidetector CT imaging of the abdomen and pelvis was performed using the standard protocol following bolus administration of intravenous contrast. CONTRAST:  125 mL Isovue-300 COMPARISON:  08/11/2012 FINDINGS: Lower chest: Visualized lung bases are clear. Hepatobiliary: Diffusely decreased attenuation of the liver consistent with steatosis. Mild hepatomegaly. Minimal extrahepatic biliary dilatation, within normal limits following cholecystectomy. Pancreas: Unremarkable. Spleen: Unremarkable. Adrenals/Urinary Tract: Punctate bilateral renal calculi without hydronephrosis or renal mass. No ureteral calculi. Unremarkable adrenal glands. Bladder is largely decompressed and contains a tiny locule of gas which could be secondary to recent catheterization. Stomach/Bowel: The stomach is within normal limits. Small duodenal diverticula without surrounding inflammatory change. Oral contrast in multiple nondilated loops of small bowel without evidence of obstruction. The colon is decompressed. The appendix is not identified, however no inflammatory changes are seen in the right lower quadrant. Vascular/Lymphatic: Mild abdominal aortic atherosclerosis without aneurysm. No enlarged lymph nodes are identified. Reproductive: Uterus and bilateral adnexa are unremarkable. Other: New intraperitoneal free fluid. No abdominal wall mass or hernia. Musculoskeletal: Osseous fusion at L5-S1. IMPRESSION: 1. Bilateral nonobstructing renal calculi. 2. Hepatic steatosis. 3. Aortic atherosclerosis. Electronically Signed   By: Logan Bores M.D.   On: 08/20/2016 12:56

## 2016-09-17 NOTE — Discharge Instructions (Signed)
Colonoscopy Discharge Instructions  Read the instructions outlined below and refer to this sheet in the next few weeks. These discharge instructions provide you with general information on caring for yourself after you leave the hospital. Your doctor may also give you specific instructions. While your treatment has been planned according to the most current medical practices available, unavoidable complications occasionally occur. If you have any problems or questions after discharge, call Dr. Gala Romney at [phone]. ACTIVITY  You may resume your regular activity, but move at a slower pace for the next 24 hours.   Take frequent rest periods for the next 24 hours.   Walking will help get rid of the air and reduce the bloated feeling in your belly (abdomen).   No driving for 24 hours (because of the medicine (anesthesia) used during the test).    Do not sign any important legal documents or operate any machinery for 24 hours (because of the anesthesia used during the test).  NUTRITION  Drink plenty of fluids.   You may resume your normal diet as instructed by your doctor.   Begin with a light meal and progress to your normal diet. Heavy or fried foods are harder to digest and may make you feel sick to your stomach (nauseated).   Avoid alcoholic beverages for 24 hours or as instructed.  MEDICATIONS  You may resume your normal medications unless your doctor tells you otherwise.  WHAT YOU CAN EXPECT TODAY  Some feelings of bloating in the abdomen.   Passage of more gas than usual.   Spotting of blood in your stool or on the toilet paper.  IF YOU HAD POLYPS REMOVED DURING THE COLONOSCOPY:  No aspirin products for 7 days or as instructed.   No alcohol for 7 days or as instructed.   Eat a soft diet for the next 24 hours.  FINDING OUT THE RESULTS OF YOUR TEST Not all test results are available during your visit. If your test results are not back during the visit, make an appointment  with your caregiver to find out the results. Do not assume everything is normal if you have not heard from your caregiver or the medical facility. It is important for you to follow up on all of your test results.  SEEK IMMEDIATE MEDICAL ATTENTION IF:  You have more than a spotting of blood in your stool.   Your belly is swollen (abdominal distention).   You are nauseated or vomiting.   You have a temperature over 101.   You have abdominal pain or discomfort that is severe or gets worse throughout the day.    Colon polyp, diverticulosis and hemorrhoid information provided  Pamphlet on hemorrhoid banding  Further recommendations to follow pending review of pathology report  Office visit with me in 6 weeks for hemorrhoid banding. Appointment on December 12 at 9:00am    Hemorrhoids Hemorrhoids are puffy (swollen) veins around the rectum or anus. Hemorrhoids can cause pain, itching, bleeding, or irritation. HOME CARE  Eat foods with fiber, such as whole grains, beans, nuts, fruits, and vegetables. Ask your doctor about taking products with added fiber in them (fibersupplements).  Drink enough fluid to keep your pee (urine) clear or pale yellow.  Exercise often.  Go to the bathroom when you have the urge to poop. Do not wait.  Avoid straining to poop (bowel movement).  Keep the butt area dry and clean. Use wet toilet paper or moist paper towels.  Medicated creams and medicine inserted into  the anus (anal suppository) may be used or applied as told.  Only take medicine as told by your doctor.  Take a warm water bath (sitz bath) for 15-20 minutes to ease pain. Do this 3-4 times a day.  Place ice packs on the area if it is tender or puffy. Use the ice packs between the warm water baths.  Put ice in a plastic bag.  Place a towel between your skin and the bag.  Leave the ice on for 15-20 minutes, 03-04 times a day.  Do not use a donut-shaped pillow or sit on the toilet for  a long time. GET HELP RIGHT AWAY IF:   You have more pain that is not controlled by treatment or medicine.  You have bleeding that will not stop.  You have trouble or are unable to poop (bowel movement).  You have pain or puffiness outside the area of the hemorrhoids. MAKE SURE YOU:   Understand these instructions.  Will watch your condition.  Will get help right away if you are not doing well or get worse.   This information is not intended to replace advice given to you by your health care provider. Make sure you discuss any questions you have with your health care provider.   Document Released: 08/14/2008 Document Revised: 10/22/2012 Document Reviewed: 09/16/2012 Elsevier Interactive Patient Education 2016 Elsevier Inc[employer]  Nonsurgical Procedures for Hemorrhoids Nonsurgical procedures can be used to treat hemorrhoids. Hemorrhoids are swollen veins that are inside the rectum (internal hemorrhoids) or around the anus (external hemorrhoids). They are caused by increased pressure in the anal area. This pressure may result from straining to have a bowel movement (constipation), diarrhea, pregnancy, obesity, anal sex, or sitting for long periods of time. Hemorrhoids can cause symptoms such as pain and bleeding. Various procedures may be performed if diet changes, lifestyle changes, and other treatments do not help your symptoms. Some of these procedures do not involve surgery. Three common nonsurgical procedures are:  Rubber band ligation. Rubber bands are used to cut off the blood supply to the hemorrhoids.  Sclerotherapy. Medicine is injected into the hemorrhoids to shrink them.  Infrared coagulation. A type of light energy is used to get rid of the hemorrhoids. LET [facility] CARE PROVIDER KNOW ABOUT:  Any allergies you have.  All medicines you are taking, including vitamins, herbs, eye drops, creams, and over-the-counter medicines.  Previous problems you or members of your  family have had with the use of anesthetics.  Any blood disorders you have.  Previous surgeries you have had.  Any medical conditions you have.  Whether you are pregnant or may be pregnant. RISKS AND COMPLICATIONS Generally, this is a safe procedure. However, problems may occur, including:  Infection.  Bleeding.  Pain. BEFORE THE PROCEDURE  Ask your health care provider about:  Changing or stopping your regular medicines. This is especially important if you are taking diabetes medicines or blood thinners.  Taking medicines such as aspirin and ibuprofen. These medicines can thin your blood. Do not take these medicines before your procedure if your health care provider instructs you not to.  You may need to have a procedure to examine the inside of your colon with a scope (colonoscopy). Your health care provider may do this to make sure that there are no other causes for your bleeding or pain. PROCEDURE  Your health care provider will clean your rectal area with a rinsing solution.  A lubricating jelly may be placed into your rectum.  The jelly may contain a medicine to numb the area (local anesthetic).  Your health care provider will insert a short scope (anoscope) into your rectum to examine the hemorrhoids.  One of the following techniques will be used. Rubber Band Ligation Your health care provider will place medical instruments through the scope to put rubber bands around the base of your hemorrhoids. The bands will cut off the blood supply to the hemorrhoids. The hemorrhoids will fall off after several days. Sclerotherapy Your health care provider will inject medicine through the scope into your hemorrhoids. This will cause them to shrink and dry up. Infrared Coagulation Your health care provider will shine a type of light through the scope onto your hemorrhoids. This light will generate energy (infrared radiation). It will cause the hemorrhoids to scar and then fall  off. Each of these procedures may vary among health care providers and hospitals. AFTER THE PROCEDURE  You will be monitored to make sure that you have no bleeding.  Return to your normal activities as told by your health care provider.   This information is not intended to replace advice given to you by your health care provider. Make sure you discuss any questions you have with your health care provider.   Document Released: 09/02/2009 Document Revised: 07/27/2015 Document Reviewed: 01/31/2015 Elsevier Interactive Patient Education 2016 Elsevier Inc.   Colon Polyps Polyps are lumps of extra tissue growing inside the body. Polyps can grow in the large intestine (colon). Most colon polyps are noncancerous (benign). However, some colon polyps can become cancerous over time. Polyps that are larger than a pea may be harmful. To be safe, caregivers remove and test all polyps. CAUSES  Polyps form when mutations in the genes cause your cells to grow and divide even though no more tissue is needed. RISK FACTORS There are a number of risk factors that can increase your chances of getting colon polyps. They include:  Being older than 50 years.  Family history of colon polyps or colon cancer.  Long-term colon diseases, such as colitis or Crohn disease.  Being overweight.  Smoking.  Being inactive.  Drinking too much alcohol. SYMPTOMS  Most small polyps do not cause symptoms. If symptoms are present, they may include:  Blood in the stool. The stool may look dark red or black.  Constipation or diarrhea that lasts longer than 1 week. DIAGNOSIS People often do not know they have polyps until their caregiver finds them during a regular checkup. Your caregiver can use 4 tests to check for polyps:  Digital rectal exam. The caregiver wears gloves and feels inside the rectum. This test would find polyps only in the rectum.  Barium enema. The caregiver puts a liquid called barium into your  rectum before taking X-rays of your colon. Barium makes your colon look white. Polyps are dark, so they are easy to see in the X-ray pictures.  Sigmoidoscopy. A thin, flexible tube (sigmoidoscope) is placed into your rectum. The sigmoidoscope has a light and tiny camera in it. The caregiver uses the sigmoidoscope to look at the last third of your colon.  Colonoscopy. This test is like sigmoidoscopy, but the caregiver looks at the entire colon. This is the most common method for finding and removing polyps. TREATMENT  Any polyps will be removed during a sigmoidoscopy or colonoscopy. The polyps are then tested for cancer. PREVENTION  To help lower your risk of getting more colon polyps:  Eat plenty of fruits and vegetables. Avoid eating fatty  foods.  Do not smoke.  Avoid drinking alcohol.  Exercise every day.  Lose weight if recommended by your caregiver.  Eat plenty of calcium and folate. Foods that are rich in calcium include milk, cheese, and broccoli. Foods that are rich in folate include chickpeas, kidney beans, and spinach. HOME CARE INSTRUCTIONS Keep all follow-up appointments as directed by your caregiver. You may need periodic exams to check for polyps. SEEK MEDICAL CARE IF: You notice bleeding during a bowel movement.   This information is not intended to replace advice given to you by your health care provider. Make sure you discuss any questions you have with your health care provider.   Document Released: 08/01/2004 Document Revised: 11/26/2014 Document Reviewed: 01/15/2012 Elsevier Interactive Patient Education 2016 [employer].   Diverticulitis Diverticulitis is when small pockets that have formed in your colon (large intestine) become infected or swollen. HOME CARE  Follow your doctor's instructions.  Follow a special diet if told by your doctor.  When you feel better, your doctor may tell you to change your diet. You may be told to eat a lot of fiber. Fruits  and vegetables are good sources of fiber. Fiber makes it easier to poop (have bowel movements).  Take supplements or probiotics as told by your doctor.  Only take medicines as told by your doctor.  Keep all follow-up visits with your doctor. GET HELP IF:  Your pain does not get better.  You have a hard time eating food.  You are not pooping like normal. GET HELP RIGHT AWAY IF:  Your pain gets worse.  Your problems do not get better.  Your problems suddenly get worse.  You have a fever.  You keep throwing up (vomiting).  You have bloody or black, tarry poop (stool). MAKE SURE YOU:   Understand these instructions.  Will watch your condition.  Will get help right away if you are not doing well or get worse.   This information is not intended to replace advice given to you by your health care provider. Make sure you discuss any questions you have with your health care provider.   Document Released: 04/23/2008 Document Revised: 11/10/2013 Document Reviewed: 09/30/2013 Elsevier Interactive Patient Education [employer].

## 2016-09-17 NOTE — Interval H&P Note (Signed)
History and Physical Interval Note:  09/17/2016 8:52 AM  [name]  has presented today for surgery, with the diagnosis of rectal bleeding, FHCRC, personal hx colon polyps  The various methods of treatment have been discussed with the patient and family. After consideration of risks, benefits and other options for treatment, the patient has consented to  Procedure(s) with comments: COLONOSCOPY WITH PROPOFOL (N/A) - 9:00 am as a surgical intervention .  The patient's history has been reviewed, patient examined, no change in status, stable for surgery.  I have reviewed the patient's chart and labs.  Questions were answered to the patient's satisfaction.     [name]  No change. Diagnostic colonoscopy per plan.  The risks, benefits, limitations, alternatives and imponderables have been reviewed with the patient. Questions have been answered. All parties are agreeable.

## 2016-09-17 NOTE — Anesthesia Preprocedure Evaluation (Signed)
Anesthesia Evaluation  Patient identified by MRN, date of birth, ID band Patient awake    Reviewed: Allergy & Precautions, NPO status , Patient's Chart, lab work & pertinent test results  Airway Mallampati: III  TM Distance: >3 FB   Mouth opening: Limited Mouth Opening  Dental  (+) Teeth Intact   Pulmonary shortness of breath and with exertion,    breath sounds clear to auscultation       Cardiovascular hypertension, Pt. on medications  Rhythm:Regular Rate:Normal     Neuro/Psych PSYCHIATRIC DISORDERS Anxiety Depression    GI/Hepatic GERD  Medicated and Poorly Controlled,  Endo/Other  diabetes, Type 2, Oral Hypoglycemic AgentsMorbid obesity  Renal/GU      Musculoskeletal   Abdominal   Peds  Hematology  (+) anemia ,   Anesthesia Other Findings   Reproductive/Obstetrics                             Anesthesia Physical Anesthesia Plan  ASA: III  Anesthesia Plan: MAC   Post-op Pain Management:    Induction: Intravenous  Airway Management Planned: Simple Face Mask  Additional Equipment:   Intra-op Plan:   Post-operative Plan:   Informed Consent: I have reviewed the patients History and Physical, chart, labs and discussed the procedure including the risks, benefits and alternatives for the proposed anesthesia with the patient or authorized representative who has indicated his/her understanding and acceptance.     Plan Discussed with:   Anesthesia Plan Comments:         Anesthesia Quick Evaluation

## 2016-09-17 NOTE — Anesthesia Postprocedure Evaluation (Signed)
Anesthesia Post Note  Patient: [name]  Procedure(s) Performed: Procedure(s) (LRB): COLONOSCOPY WITH PROPOFOL (N/A) POLYPECTOMY  Patient location during evaluation: PACU Anesthesia Type: MAC Level of consciousness: awake and patient cooperative Pain management: pain level controlled Vital Signs Assessment: post-procedure vital signs reviewed and stable Respiratory status: spontaneous breathing, nonlabored ventilation and respiratory function stable Cardiovascular status: blood pressure returned to baseline Postop Assessment: no signs of nausea or vomiting Anesthetic complications: no    Last Vitals:  Vitals:   09/17/16 0850 09/17/16 0900  BP: 121/70 123/68  Pulse:    Resp: 19 18  Temp:      Last Pain:  Vitals:   09/17/16 0753  TempSrc: Oral                 [name]

## 2016-09-17 NOTE — Transfer of Care (Signed)
Immediate Anesthesia Transfer of Care Note  Patient: [name]  Procedure(s) Performed: Procedure(s) with comments: COLONOSCOPY WITH PROPOFOL (N/A) - 9:00 am POLYPECTOMY - ascending colon  Patient Location: PACU  Anesthesia Type:MAC  Level of Consciousness: awake and patient cooperative  Airway & Oxygen Therapy: Patient Spontanous Breathing and Patient connected to face mask oxygen  Post-op Assessment: Report given to RN, Post -op Vital signs reviewed and stable and Patient moving all extremities  Post vital signs: Reviewed and stable  Last Vitals:  Vitals:   09/17/16 0850 09/17/16 0900  BP: 121/70 123/68  Pulse:    Resp: 19 18  Temp:      Last Pain:  Vitals:   09/17/16 0753  TempSrc: Oral      Patients Stated Pain Goal: 6 ([id number])  Complications: No apparent anesthesia complications

## 2016-09-18 ENCOUNTER — Encounter: Payer: Self-pay | Admitting: Internal Medicine

## 2016-09-19 ENCOUNTER — Encounter (HOSPITAL_COMMUNITY): Payer: Self-pay | Admitting: Internal Medicine

## 2016-09-26 ENCOUNTER — Telehealth: Payer: Self-pay

## 2016-09-26 MED ORDER — METRONIDAZOLE 500 MG PO TABS: 500.0000 mg | ORAL_TABLET | Freq: Three times a day (TID) | ORAL | 0 refills | Status: DC

## 2016-09-26 MED ORDER — CIPROFLOXACIN HCL 500 MG PO TABS: 500.0000 mg | ORAL_TABLET | Freq: Two times a day (BID) | ORAL | 0 refills | Status: DC

## 2016-09-26 NOTE — Telephone Encounter (Signed)
Patient complains of nausea, crampy like abdominal pain associated with loose stools. Similar to symptoms prior to her colonoscopy. Relief at times with passing bowel movement but not always. No bleeding. No melena. No fever. Diverticulosis on recent colonoscopy. Polypectomy performed as well. Discussed with patient. Empirically treat with Cipro and Flagyl for possible diverticulitis. If symptoms worsen she should go to the emergency department. I doubt we are dealing with consultation from prior colonoscopy. Patient is actually at work managing her symptoms there. Rx sent to pharmacy.  Please touch base with patient tomorrow to see how she is doing. Advised her also to keep office visit with Dr. Gala Romney in December as scheduled.  Please call patient back and tell her I sent RX to [facility]. I couldn't remember if she said [facility] or [facility] and both were listed as her pharmacy.

## 2016-09-26 NOTE — Telephone Encounter (Signed)
LOMOM

## 2016-09-26 NOTE — Telephone Encounter (Signed)
Pt is calling because she is having sever stomach pain. She is having nausea.  She is also having diarrhea for two days. Please advise .Her call back number is [phone]

## 2016-10-01 ENCOUNTER — Telehealth: Payer: Self-pay | Admitting: Gastroenterology

## 2016-10-01 NOTE — Telephone Encounter (Signed)
Drink plenty of fluids. If she is having no other symptoms, monitor for now but yes agree with ED evaluation if she is feeling weak. [name] for any further recommendations.

## 2016-10-01 NOTE — Telephone Encounter (Signed)
Pt called asking to speak with LSL. I told her that LSL was with patients and I could take a message. Pt said that she is on 2 different antibiotics and she is feeling funny and not quite right. She said her blood pressure had dropped and didn't know what to do or if the antibiotics were causing this to happen. Please advise and call patient at [phone]

## 2016-10-01 NOTE — Telephone Encounter (Signed)
Pt went to [facility] today and they checked her BP and it was 97/66. They asked her if she felt alright and she told them she felt weak. She went back to work at [employer], but said she feels light headed. She had not eaten today until about 1:45 pm and she ate some Ravioli.  Said she just has not wanted to eat much since she has been on the antibiotics. She wanted to know if the Flagyl and Cipro could have caused the light headedness, and I told her I did not feel like it was that, she has been on it for about a week and been OK. I advised her to see PCP but not to drive, to get someone to take her or if she felt worse to get someone to take her to the ED, but definitely DO NOT DRIVE.  Forwarding to Neil Crouch, PA and also Roseanne Kaufman, NP since Magda Paganini is out this afternoon.

## 2016-10-02 ENCOUNTER — Other Ambulatory Visit: Payer: Self-pay

## 2016-10-02 DIAGNOSIS — R531 Weakness: Secondary | ICD-10-CM

## 2016-10-02 DIAGNOSIS — R197 Diarrhea, unspecified: Secondary | ICD-10-CM

## 2016-10-02 DIAGNOSIS — R42 Dizziness and giddiness: Secondary | ICD-10-CM

## 2016-10-02 NOTE — Telephone Encounter (Signed)
I recommend drinking plenty of fluids to keep urine light in color. Let's check CBC, met-7.  If she is still having diarrhea, then check C.diff PCR. Agree with ER evaluation if persistent lightheadedness, weakness.   Offer her follow up visit within next 1-2 weeks. She can see PCP for urgent needs in the interim.

## 2016-10-02 NOTE — Telephone Encounter (Signed)
PT informed of plan. She will go to [facility] for the labs and pick up container there for the C-diff, as she is still having diarrhea.  OV with Neil Crouch, PA on 10/10/2016 at 2:30 pm to follow up on these problems.

## 2016-10-02 NOTE — Telephone Encounter (Signed)
LMOM for pt to call. 

## 2016-10-03 ENCOUNTER — Ambulatory Visit: Payer: BLUE CROSS/BLUE SHIELD | Admitting: Family Medicine

## 2016-10-03 ENCOUNTER — Encounter: Payer: Self-pay | Admitting: Nurse Practitioner

## 2016-10-03 ENCOUNTER — Ambulatory Visit (INDEPENDENT_AMBULATORY_CARE_PROVIDER_SITE_OTHER): Payer: BLUE CROSS/BLUE SHIELD | Admitting: Nurse Practitioner

## 2016-10-03 VITALS — BP 114/70 | HR 91 | Temp 97.8°F | Ht 68.0 in | Wt 245.8 lb

## 2016-10-03 DIAGNOSIS — R112 Nausea with vomiting, unspecified: Secondary | ICD-10-CM | POA: Diagnosis not present

## 2016-10-03 DIAGNOSIS — R197 Diarrhea, unspecified: Secondary | ICD-10-CM

## 2016-10-03 DIAGNOSIS — K219 Gastro-esophageal reflux disease without esophagitis: Secondary | ICD-10-CM

## 2016-10-03 DIAGNOSIS — R109 Unspecified abdominal pain: Secondary | ICD-10-CM

## 2016-10-03 MED ORDER — DICYCLOMINE HCL 10 MG PO CAPS: 10.0000 mg | ORAL_CAPSULE | Freq: Three times a day (TID) | ORAL | 2 refills | Status: DC | PRN

## 2016-10-03 MED ORDER — HYDROCORTISONE 2.5 % RE CREA: 1.0000 "application " | TOPICAL_CREAM | Freq: Two times a day (BID) | RECTAL | 1 refills | Status: DC | PRN

## 2016-10-03 NOTE — Telephone Encounter (Signed)
Pt said she did labs yesterday, but her stools are soft and mushy now, not watery.

## 2016-10-03 NOTE — Patient Instructions (Signed)
1. Finish all of your antibiotics. 2. Start taking a daily probiotic for the next month. 3. I have sent in a prescription for Bentyl 10 mg. Take this 3 times a day, as needed for loose stools or abdominal pain. 4. I have sent in a prescription for Anusol rectal cream. Apply this twice a day, for up to 10 days at a time, for rectal pain/irritation/burning. 5. Call in 1 week with a progress report on how you're doing. 6. Return for follow-up in 2 weeks. 7. I've added a blood test on, have this drawn as soon as he can. While you were there please make sure that they have enough specimen to complete all the tests previously ordered. 8. If you have any severe symptoms, as we discussed at your visit, proceed to the emergency room.

## 2016-10-03 NOTE — Progress Notes (Signed)
Referring Provider: Mikey Kirschner, MD Primary Care Physician:  Mickie Hillier, MD Primary GI:  Dr. Gala Romney  Chief Complaint  Patient presents with  . Follow-up    HPI:   [name] is a 50 y.o. female who presents for quick evaluation and recommendation of another provider in the office. The patient was last seen by our service 09/17/2016 for colonoscopy related to hematochezia in the setting of high risk for colon cancer due to positive family history in a first-degree relative at a relatively young age. Procedure was completed on propofol. Findings included grade 2 hemorrhoids as likely source of hematochezia, colonic diverticulosis, ascending colon polyp measuring 5 mm status post removal. Deemed likely good candidate for hemorrhoid banding. Surgical pathology found the polyp to be sessile/serrated adenoma without dysplasia. Recommended 5 year repeat colonoscopy.  The patient called our office 09/26/2016 complaining of abdominal pain, nausea, diarrhea for 2 days with abdominal pain described as crampy and deemed similar to symptoms prior to colonoscopy. She was empirically treated with Cipro and Flagyl for possible diverticulitis and referred to the emergency department for any worsening symptoms. The patient called back to our office 10/01/2016 stating she was not feeling well on the 2 antibiotics including fatigue and lightheadedness with spot blood pressure check found to be low at 97/66. She was advised to see her primary care provider but cannot drive. Provider evaluated and recommended plenty of fluids, CBC, BMP, C. difficile PCR ordered. She called back saying she was feeling worse and has vomited 3 times, worsening abdominal pain unable to go to work labs were completed and found to be pending and recommended coming in for an urgent office visit.  Recent CT of the abdomen is reassuring completed on 08/20/2016 and found bilateral nonobstructing renal calculi, hepatic  steatosis, aortic atherosclerosis. No other acute findings.  Today her BP is much improved (114/70). Today she states she's still having symptoms. 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 Is still taking antibiotics for possible diverticulitis. Abdominal pain lower abdomen, pretty constant, described as "dull dragging and occasionally sharp.) Decreased appetite. Stools about 5-6 times a day, "soft serve" consistency ([place] 6). No watery diarrhea. Denies hematochezia, melena. Pain minimally improves after a bowel movement. Had vomiting x 3 this morning, nausea worse after eating. Some GERD symptoms wghich chronic GERD noted including esophageal burning and bitter taste (no worse then baseline). Denies chest pain, dizziness, lightheadedness, syncope, near syncope. Denies any other upper or lower GI symptoms.  Past Medical History:  Diagnosis Date  . Anxiety   . Blood transfusion without reported diagnosis 1999   due to heavy menses  . Breast cancer ([place]) 05/22/2011   03/01/11, Stage 2, s/p lumpectomy, chemo/xrt  . DDD (degenerative disc disease), lumbar   . Depression 06/22/2011  . DM (diabetes mellitus) ([place]) 06/22/2011  . Genital warts   . GERD (gastroesophageal reflux disease) 06/22/2011  . Hx of adenomatous colonic polyps 10/2007   45mm sigmoid tubular adenoma, FH colon cancer, mother in mid-50s  . Hypercholesterolemia 06/22/2011  . Hypertension 06/22/2011  . 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 Iron deficiency anemia 03/25/2015  . Obesity 06/22/2011  . Shortness of breath   . 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 Vaginal bleeding 03/08/2014    Past Surgical History:  Procedure Laterality Date  . back surg x2    . CHOLECYSTECTOMY    . COLONOSCOPY  11/03/07  4-mm sessile polyp removed/small internal hemorrhoids/tubular adenoma, random colon bx negative for microscopic colitis  . COLONOSCOPY WITH PROPOFOL N/A 09/17/2016    Procedure: COLONOSCOPY WITH PROPOFOL;  Surgeon: Daneil Dolin, MD;  Location: [facility];  Service: Endoscopy;  Laterality: N/A;  9:00 am  . COLONOSCOPY, ESOPHAGOGASTRODUODENOSCOPY (EGD) AND ESOPHAGEAL DILATION  01/2012   SLF: empiric esophageal dilation, gastritis with benign bx, hemorrhoids  . DILATATION & CURETTAGE/HYSTEROSCOPY WITH MYOSURE N/A 02/10/2015   Procedure: DILATATION & CURETTAGE/HYSTEROSCOPY WITH MYOSURE;  Surgeon: Nunzio Cobbs, MD;  Location: [facility];  Service: Gynecology;  Laterality: N/A;  . MASTECTOMY PARTIAL / LUMPECTOMY     rt.  . MM BREAST STEREO BX*L*R/S     rt.  Marland Kitchen POLYPECTOMY  09/17/2016   Procedure: POLYPECTOMY;  Surgeon: Daneil Dolin, MD;  Location: [facility];  Service: Endoscopy;;  ascending colon  . PORT-A-CATH REMOVAL  05/26/2012   Procedure: REMOVAL PORT-A-CATH;  Surgeon: Jamesetta So, MD;  Location: [facility];  Service: General;  Laterality: N/A;  Minor Room  . PORTACATH PLACEMENT      Current Outpatient Prescriptions  Medication Sig Dispense Refill  . albuterol (PROVENTIL HFA;VENTOLIN HFA) 108 (90 Base) MCG/ACT inhaler Inhale 2 puffs into the lungs every 6 (six) hours as needed for wheezing or shortness of breath.    . ALPRAZolam (XANAX) 0.5 MG tablet Take 0.5 mg by mouth 5 (five) times daily as needed for anxiety.     Marland Kitchen atorvastatin (LIPITOR) 10 MG tablet TAKE ONE TABLET BY MOUTH ONCE DAILY 30 tablet 2  . ciprofloxacin (CIPRO) 500 MG tablet Take 1 tablet (500 mg total) by mouth 2 (two) times daily. 20 tablet 0  . citalopram (CELEXA) 40 MG tablet Take 1 tablet (40 mg total) by mouth daily. 90 tablet 1  . lisinopril-hydrochlorothiazide (PRINZIDE,ZESTORETIC) 10-12.5 MG tablet Take 1 tablet by mouth daily. 90 tablet 1  . metFORMIN (GLUCOPHAGE) 500 MG tablet Take 1 tablet (500 mg total) by mouth 2 (two) times daily. 180 tablet 1  . metroNIDAZOLE (FLAGYL) 500 MG tablet Take 1 tablet (500 mg total) by mouth 3 (three) times daily. 30 tablet 0  .  pantoprazole (PROTONIX) 40 MG tablet Take 1 tablet (40 mg total) by mouth daily. For acid reflux 90 tablet 1   No current facility-administered medications for this visit.     Allergies as of 10/03/2016 - Review Complete 10/03/2016  Allergen Reaction Noted  . Pravastatin Other (See Comments) 08/02/2015    Family History  Problem Relation Age of Onset  . Colon cancer Mother     mid-50s, died of metastatic disease  . Cancer Mother     liver  . Coronary artery disease Mother   . Diabetes type I Mother   . Coronary artery disease Father   . Diabetes Father   . Diabetes type I Father   . Hypertension Father   . Bipolar disorder Sister   . Coronary artery disease Brother   . Hypertension Brother   . Hypertension Maternal Grandmother   . Hyperlipidemia Maternal Grandmother   . Stroke Maternal Grandmother   . Hypertension Maternal Grandfather   . Diabetes Maternal Grandfather   . Diabetes Paternal Grandmother   . Heart disease Paternal Grandfather     Social History   Social History  . Marital status: Significant Other    Spouse name: N/A  . Number of children: 3  . Years of education: N/A   Occupational History  . child care  [employer]   Social History Main Topics  . Smoking status: Never Smoker  . Smokeless tobacco: Never Used  . Alcohol use No  . Drug use: No  . Sexual activity: Yes    Partners: Male    Birth control/ protection: None   Other Topics Concern  . None   Social History Narrative  . None    Review of Systems: General: Negative for anorexia, weight loss, fever, chills. ENT: Negative for hoarseness, difficulty swallowing. CV: Negative for chest pain, angina, palpitations, peripheral edema.  Respiratory: Negative for dyspnea at rest, cough, sputum, wheezing.  GI: See history of present illness. Endo: Negative for unusual weight change.  Heme: Negative for bruising or bleeding.  Physical Exam: BP 114/70   Pulse 91   Temp 97.8 F  (36.6 C) (Oral)   Ht 5\' 8"  (1.727 m)   Wt 245 lb 12.8 oz (111.5 kg)   BMI 37.37 kg/m  General:   Morbidly obese female. Alert and oriented. Pleasant and cooperative. Well-nourished and well-developed.  Ears:  Normal auditory acuity. Cardiovascular:  S1, S2 present without murmurs appreciated. Extremities without clubbing or edema. Respiratory:  Clear to auscultation bilaterally. No wheezes, rales, or rhonchi. No distress.  Gastrointestinal:  +BS, obese but soft, and non-distended. Mild to moderate periumbilical TTP noted. No HSM noted. No guarding or rebound. No masses appreciated.  Rectal:  Deferred  Musculoskalatal:  Symmetrical without gross deformities. Neurologic:  Alert and oriented x4;  grossly normal neurologically. Psych:  Alert and cooperative. Normal mood and affect. Heme/Lymph/Immune: No excessive bruising noted.    10/03/2016 3:13 PM   Disclaimer: This note was dictated with voice recognition software. Similar sounding words can inadvertently be transcribed and may not be corrected upon review.

## 2016-10-03 NOTE — Telephone Encounter (Signed)
OV with Walden Field, NP today at 2:30 pm.

## 2016-10-03 NOTE — Telephone Encounter (Signed)
Labs are pending.   If we have any cancellations for today, let's try to bring her in for evaluation.

## 2016-10-03 NOTE — Telephone Encounter (Signed)
PT called just now and said she is feeling worse today. She has vomited about 3 times and nauseated. Still having abdominal pain and could not go to work. She asked if she should see PCP, and I told her that is what Neil Crouch, PA said to do in the interim. She had scheduled an appt for 10/10/2016 at 2:30 PM with Magda Paganini.

## 2016-10-04 LAB — CBC WITH DIFFERENTIAL/PLATELET
BASOS PCT: 0 %
Basophils Absolute: 0 cells/uL (ref 0–200)
Eosinophils Absolute: 204 cells/uL (ref 15–500)
Eosinophils Relative: 3 %
HEMATOCRIT: 40.4 % (ref 35.0–45.0)
Hemoglobin: 12.9 g/dL (ref 11.7–15.5)
Lymphocytes Relative: 34 %
Lymphs Abs: 2312 cells/uL (ref 850–3900)
MCH: 28 pg (ref 27.0–33.0)
MCHC: 31.9 g/dL — AB (ref 32.0–36.0)
MCV: 87.6 fL (ref 80.0–100.0)
MONOS PCT: 6 %
MPV: 11.3 fL (ref 7.5–12.5)
Monocytes Absolute: 408 cells/uL (ref 200–950)
NEUTROS PCT: 57 %
Neutro Abs: 3876 cells/uL (ref 1500–7800)
PLATELETS: 229 10*3/uL (ref 140–400)
RBC: 4.61 MIL/uL (ref 3.80–5.10)
RDW: 15.5 % — AB (ref 11.0–15.0)
WBC: 6.8 10*3/uL (ref 3.8–10.8)

## 2016-10-04 LAB — BASIC METABOLIC PANEL
BUN: 14 mg/dL (ref 7–25)
CO2: 31 mmol/L (ref 20–31)
Calcium: 9.3 mg/dL (ref 8.6–10.4)
Chloride: 101 mmol/L (ref 98–110)
Creat: 1.15 mg/dL — ABNORMAL HIGH (ref 0.50–1.05)
Glucose, Bld: 136 mg/dL — ABNORMAL HIGH (ref 65–99)
Potassium: 4.2 mmol/L (ref 3.5–5.3)
Sodium: 138 mmol/L (ref 135–146)

## 2016-10-06 LAB — LIPASE: LIPASE: 29 U/L (ref 7–60)

## 2016-10-08 NOTE — Assessment & Plan Note (Signed)
The patient has associated abdominal pain as well as rectal plane due to frequent stools. Nausea, vomiting, diarrhea as noted above. Prophylactic antibiotics were started by primary care for presumed diverticulitis. I will have her finish the antibiotics, start a probiotic for 30 days and Bentyl 10 mg as needed for abdominal pain and diarrhea. I send an Anusol to her pharmacy for rectal pain due to frequent stools. She is to call us in 1 week with a progress report. I will also add lipase to her labs. Return for follow-up in 2 weeks.

## 2016-10-08 NOTE — Assessment & Plan Note (Signed)
The patient admits abdominal pain, nausea, vomiting, diarrhea since 09/26/2016. She was placed on Cipro and Flagyl for possible diverticulitis by primary care and referred to emergency department for any worsening symptoms.. Labs including CBC, BMP, C. difficile PCR were ordered. The C. difficile was not run by the lab because her stools were "soft not loose." At this point recommend finishing antibiotics, start probiotic for 30 days, when antibiotics are finished can start Bentyl 10 mg 3 times a day as needed. 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 Return for follow-ups in 2 weeks to further evaluate.

## 2016-10-08 NOTE — Progress Notes (Signed)
cc'ed to pcp °

## 2016-10-08 NOTE — Assessment & Plan Note (Signed)
GERD symptoms are generally well controlled, symptoms at baseline with occasional breakthrough symptoms. Recommend continue current medications. Return for follow-up as needed.

## 2016-10-10 ENCOUNTER — Ambulatory Visit: Payer: BLUE CROSS/BLUE SHIELD | Admitting: Gastroenterology

## 2016-10-19 ENCOUNTER — Ambulatory Visit: Payer: BLUE CROSS/BLUE SHIELD | Admitting: Internal Medicine

## 2016-10-22 NOTE — Progress Notes (Signed)
Labs unremarkable. Patient seen in office since labs.

## 2016-10-29 ENCOUNTER — Telehealth: Payer: Self-pay | Admitting: Internal Medicine

## 2016-10-29 NOTE — Telephone Encounter (Signed)
Per Erline Levine she is on the schedule for tomorrow to see RMR

## 2016-10-29 NOTE — Telephone Encounter (Signed)
Pt called to confirm her OV. Her OV was cancelled on 11/15. Patient said that she did not cancel her appt and needed to have banding done ASAP because she is still bleeding and she has taken off work to come tomorrow. I told her I don't why she was cancelled but if anyone off of RMR's schedule for tomorrow cancels I could get her back on the schedule. Patient was getting upset about taking off work and her appt being cancelled and she wasn't aware of it. Please advise. [phone]

## 2016-10-30 ENCOUNTER — Encounter: Payer: BLUE CROSS/BLUE SHIELD | Admitting: Internal Medicine

## 2016-10-30 ENCOUNTER — Telehealth: Payer: Self-pay

## 2016-10-30 ENCOUNTER — Ambulatory Visit (INDEPENDENT_AMBULATORY_CARE_PROVIDER_SITE_OTHER): Payer: BLUE CROSS/BLUE SHIELD | Admitting: Internal Medicine

## 2016-10-30 ENCOUNTER — Encounter: Payer: Self-pay | Admitting: Internal Medicine

## 2016-10-30 VITALS — BP 129/79 | HR 72 | Temp 98.0°F | Ht 69.0 in | Wt 244.0 lb

## 2016-10-30 DIAGNOSIS — B356 Tinea cruris: Secondary | ICD-10-CM

## 2016-10-30 DIAGNOSIS — K648 Other hemorrhoids: Secondary | ICD-10-CM

## 2016-10-30 DIAGNOSIS — K641 Second degree hemorrhoids: Secondary | ICD-10-CM | POA: Diagnosis not present

## 2016-10-30 MED ORDER — HYDROCORTISONE 2.5 % RE CREA: 1.0000 "application " | TOPICAL_CREAM | Freq: Two times a day (BID) | RECTAL | 1 refills | Status: DC

## 2016-10-30 NOTE — Progress Notes (Signed)
[place] banding procedure note:  The patient presents with symptomatic grade 2 hemorrhoids, unresponsive to maximal medical therapy, requesting rubber band ligation of his/her hemorrhoidal disease. All risks, benefits, and alternative forms of therapy were described and informed consent was obtained.  In the left lateral decubitus position, DRE, utilizing a pea-sized amount of nitroglycerin 0.125% 2% Xylocaine cream for lubrication,  revealed no abnormalities. Some redness at the anal opening. Anoscopy revealed a prominent right posterior hemorrhoid column.  The decision was made to band the right posterior internal hemorrhoid, and the [place] was used to perform band ligation without complication. Digital anorectal examination was then performed to assure proper positioning of the band; Band found to be in excellent position. No pinching or pain. The patient was discharged home without pain or other issues. Dietary and behavioral recommendations were given along with follow-up instructions. The patient will return in 4 weeks for followup and possible additional banding as required. Patient relates intermittent jabbing left flank pain recently. No abdominal pain. Has not contacted PCP as of yet. No complications were encountered and the patient tolerated the procedure well. Likely has a tinea infection. See discharge instructions.

## 2016-10-30 NOTE — Telephone Encounter (Signed)
PT was aware to call if no improvement.

## 2016-10-30 NOTE — Patient Instructions (Signed)
Avoid straining.  Benefiber 1 tablespoon twice daily  Limit toilet time to 5 minutes  Call with any interim problems  Schedule followup appointment in 4 weeks from now  Try miconazole-over-the-counter cream applied to the outside of the anus as needed for itching  See Dr. Wolfgang Phoenix regards to left flank pain.

## 2016-10-30 NOTE — Telephone Encounter (Signed)
I sent in Anusol to take BID. If no improvement or worsening, needs to call right away.

## 2016-10-30 NOTE — Telephone Encounter (Signed)
Pt called and said she has felt pressure this afternoon from the hemorrhoid banding this am. She said it is not pinching nor pain, just pressure and them it increased after she had a BM. She was asking could we send something to her pharmacy to help. I spoke to Roseanne Kaufman, NP who said she will send in some anusol. I also told pt to please let us know if it did not improve.

## 2016-11-10 ENCOUNTER — Other Ambulatory Visit: Payer: Self-pay | Admitting: Family Medicine

## 2016-11-10 DIAGNOSIS — E785 Hyperlipidemia, unspecified: Secondary | ICD-10-CM

## 2016-11-21 ENCOUNTER — Encounter: Payer: Self-pay | Admitting: Family Medicine

## 2016-11-21 ENCOUNTER — Ambulatory Visit (INDEPENDENT_AMBULATORY_CARE_PROVIDER_SITE_OTHER): Payer: BLUE CROSS/BLUE SHIELD | Admitting: Family Medicine

## 2016-11-21 VITALS — BP 122/74 | Temp 98.6°F | Ht 64.0 in | Wt 246.0 lb

## 2016-11-21 DIAGNOSIS — N23 Unspecified renal colic: Secondary | ICD-10-CM | POA: Diagnosis not present

## 2016-11-21 DIAGNOSIS — N10 Acute tubulo-interstitial nephritis: Secondary | ICD-10-CM | POA: Diagnosis not present

## 2016-11-21 LAB — POCT URINALYSIS DIPSTICK
PH UA: 5
Spec Grav, UA: 1.02

## 2016-11-21 MED ORDER — LEVOFLOXACIN 500 MG PO TABS: 500.0000 mg | ORAL_TABLET | Freq: Every day | ORAL | 0 refills | Status: AC

## 2016-11-21 MED ORDER — NAPROXEN 500 MG PO TABS: 500.0000 mg | ORAL_TABLET | Freq: Two times a day (BID) | ORAL | 0 refills | Status: DC

## 2016-11-21 MED ORDER — ALBUTEROL SULFATE HFA 108 (90 BASE) MCG/ACT IN AERS
2.0000 | INHALATION_SPRAY | Freq: Four times a day (QID) | RESPIRATORY_TRACT | 2 refills | Status: DC | PRN
Start: 2016-11-21 — End: 2017-06-07

## 2016-11-21 NOTE — Progress Notes (Signed)
   Subjective:    Patient ID: [name], female    DOB: [birth year], 51 y.o.   MRN: [id number]  Flank Pain  This is a new problem. Episode onset: one week. Pain location: right side. Associated symptoms include abdominal pain. (Painful urination, urgency to urinate) She has tried nothing for the symptoms.   Coughing. Started 2 -3 weeks ago.   Coughing some off and on  Felt light headed yest trabsiently   Last vomited yest morn     Vomiting and diarrhea. Started one week ago.    Review of Systems  Gastrointestinal: Positive for abdominal pain.  Genitourinary: Positive for flank pain.       Objective:   Physical Exam  Alert vitals stable hydration good talkative lungs clear heart regular in rhythm. Positive right CVA tenderness. Next  Urinalysis 4-6 white blood cells per high-power field with clusters      Assessment & Plan:  Impression urinary tract infection with plus potentially right pyelonephritis involvement discussed plan appropriate antibiotics symptom care discussed 1 signs discussed

## 2016-12-05 ENCOUNTER — Other Ambulatory Visit: Payer: Self-pay | Admitting: Family Medicine

## 2016-12-05 DIAGNOSIS — E785 Hyperlipidemia, unspecified: Secondary | ICD-10-CM

## 2016-12-07 ENCOUNTER — Ambulatory Visit (INDEPENDENT_AMBULATORY_CARE_PROVIDER_SITE_OTHER): Payer: BLUE CROSS/BLUE SHIELD | Admitting: Family Medicine

## 2016-12-07 ENCOUNTER — Encounter: Payer: Self-pay | Admitting: Family Medicine

## 2016-12-07 ENCOUNTER — Ambulatory Visit: Payer: BLUE CROSS/BLUE SHIELD | Admitting: Internal Medicine

## 2016-12-07 VITALS — BP 138/90 | Ht 64.0 in | Wt 246.0 lb

## 2016-12-07 DIAGNOSIS — E785 Hyperlipidemia, unspecified: Secondary | ICD-10-CM

## 2016-12-07 DIAGNOSIS — I10 Essential (primary) hypertension: Secondary | ICD-10-CM

## 2016-12-07 DIAGNOSIS — R079 Chest pain, unspecified: Secondary | ICD-10-CM | POA: Diagnosis not present

## 2016-12-07 DIAGNOSIS — E119 Type 2 diabetes mellitus without complications: Secondary | ICD-10-CM

## 2016-12-07 DIAGNOSIS — Z79899 Other long term (current) drug therapy: Secondary | ICD-10-CM

## 2016-12-07 LAB — POCT GLYCOSYLATED HEMOGLOBIN (HGB A1C): Hemoglobin A1C: 5.2

## 2016-12-07 MED ORDER — BLOOD GLUCOSE MONITOR KIT: PACK | 5 refills | Status: DC

## 2016-12-07 MED ORDER — LISINOPRIL 10 MG PO TABS
10.0000 mg | ORAL_TABLET | Freq: Every day | ORAL | 5 refills | Status: DC
Start: 2016-12-07 — End: 2017-05-31

## 2016-12-07 NOTE — Progress Notes (Signed)
   Subjective:    Patient ID: [name], female    DOB: 03-01-1966, 51 y.o.   MRN: [id number]  Diabetes  She presents for her follow-up diabetic visit. She has type 2 diabetes mellitus. Current diabetic treatments: metformin. She is compliant with treatment all of the time. Home blood sugar record trend: does not have glucose meter. She does not see a podiatrist.Eye exam is not current.  A1C today 5.2.  Patient claims compliance with diabetes medication. No obvious side effects. Reports no substantial low sugar spells. Most numbers are generally in good range when checked fasting. Generally does not miss a dose of medication. Watching diabetic diet closely  Chest pain, pt feels out of breth withour much exertio.Patient very worried about heart disease. Strong family history. Zero exercise.  With the sob pt also notes occas discmfor tin the chest.. With walking to mailbox or in [employer], feels s o b  Feels unsteady and dizz y at ties  Blood pressure medicine and blood pressure levels reviewed today with patient. Compliant with blood pressure medicine. States does not miss a dose. No obvious side effects. Blood pressure generally good when checked elsewhere. Watching salt intake.   Patient continues to take lipid medication regularly. No obvious side effects from it. Generally does not miss a dose. Prior blood work results are reviewed with patient. Patient continues to work on fat intake in diet   dizziness, fatigue. Started a few months ago. Getting worse. Dizziness worsens when standing quickly and lightheaded   Review of Systems No headache, no major weight loss or weight gain, no chest pain no back pain abdominal pain no change in bowel habits complete ROS otherwise negative      Objective:   Physical Exam Alert vitals stable, NAD. Blood pressure good on repeat. HEENT normal. Lungs clear. Heart regular rate and rhythm. Blood pressure on repeat borderline low at  106/70  EKG normal sinus rhythm no significant ST-T changes some artifact     Assessment & Plan:  Impression 1 type 2 diabetes excellent control discussed to maintain same #2 hyperlipidemia status uncertain prior blood work reviewed discussed maintain pending #3 hypertension controlled perhaps 2 type. Could be contributing to dizziness discussed #4 atypical chest pain with high concern on part of patient plan cardiology referral. Appropriate blood work. Maintain all medications diet exercise discussed. Monitor prescribed recheck in 6 months is

## 2016-12-08 LAB — BASIC METABOLIC PANEL
BUN/Creatinine Ratio: 14 (ref 9–23)
BUN: 12 mg/dL (ref 6–24)
CO2: 27 mmol/L (ref 18–29)
Calcium: 9.4 mg/dL (ref 8.7–10.2)
Chloride: 97 mmol/L (ref 96–106)
Creatinine, Ser: 0.88 mg/dL (ref 0.57–1.00)
GFR calc Af Amer: 88 mL/min/{1.73_m2} (ref >59)
GFR, EST NON AFRICAN AMERICAN: 76 mL/min/{1.73_m2} (ref >59)
Glucose: 103 mg/dL — ABNORMAL HIGH (ref 65–99)
Potassium: 4.3 mmol/L (ref 3.5–5.2)
SODIUM: 140 mmol/L (ref 134–144)

## 2016-12-08 LAB — HEPATIC FUNCTION PANEL
ALK PHOS: 55 IU/L (ref 39–117)
ALT: 18 IU/L (ref 0–32)
AST: 16 IU/L (ref 0–40)
Albumin: 4.1 g/dL (ref 3.5–5.5)
Bilirubin Total: 0.4 mg/dL (ref 0.0–1.2)
Bilirubin, Direct: 0.12 mg/dL (ref 0.00–0.40)
Total Protein: 7.4 g/dL (ref 6.0–8.5)

## 2016-12-08 LAB — MICROALBUMIN / CREATININE URINE RATIO
CREATININE, UR: 88.8 mg/dL
Microalb/Creat Ratio: <3.4 mg/g creat (ref 0.0–30.0)
Microalbumin, Urine: <3 ug/mL

## 2016-12-08 LAB — LIPID PANEL
Chol/HDL Ratio: 3.9 ratio units (ref 0.0–4.4)
Cholesterol, Total: 183 mg/dL (ref 100–199)
HDL: 47 mg/dL (ref >39)
LDL Calculated: 82 mg/dL (ref 0–99)
Triglycerides: 271 mg/dL — ABNORMAL HIGH (ref 0–149)
VLDL Cholesterol Cal: 54 mg/dL — ABNORMAL HIGH (ref 5–40)

## 2016-12-13 ENCOUNTER — Telehealth: Payer: Self-pay

## 2016-12-13 NOTE — Telephone Encounter (Signed)
Pt is having some bleeding and itching and pain. She is set up for a banding on 12/25/16 but she is wanting to see if she can be seen sooner that. Please advise

## 2016-12-14 ENCOUNTER — Emergency Department (HOSPITAL_COMMUNITY)
Admission: EM | Admit: 2016-12-14 | Discharge: 2016-12-14 | Disposition: A | Discharge status: Home / Self Care | Payer: BLUE CROSS/BLUE SHIELD | Attending: Emergency Medicine | Admitting: Emergency Medicine

## 2016-12-14 ENCOUNTER — Emergency Department (HOSPITAL_COMMUNITY): Payer: BLUE CROSS/BLUE SHIELD

## 2016-12-14 ENCOUNTER — Encounter (HOSPITAL_COMMUNITY): Payer: Self-pay | Admitting: *Deleted

## 2016-12-14 ENCOUNTER — Ambulatory Visit: Payer: BLUE CROSS/BLUE SHIELD | Admitting: Nurse Practitioner

## 2016-12-14 DIAGNOSIS — Z853 Personal history of malignant neoplasm of breast: Secondary | ICD-10-CM | POA: Diagnosis not present

## 2016-12-14 DIAGNOSIS — E119 Type 2 diabetes mellitus without complications: Secondary | ICD-10-CM | POA: Diagnosis not present

## 2016-12-14 DIAGNOSIS — Z79899 Other long term (current) drug therapy: Secondary | ICD-10-CM | POA: Insufficient documentation

## 2016-12-14 DIAGNOSIS — I10 Essential (primary) hypertension: Secondary | ICD-10-CM | POA: Diagnosis not present

## 2016-12-14 DIAGNOSIS — E860 Dehydration: Secondary | ICD-10-CM

## 2016-12-14 DIAGNOSIS — Z7984 Long term (current) use of oral hypoglycemic drugs: Secondary | ICD-10-CM | POA: Diagnosis not present

## 2016-12-14 DIAGNOSIS — R112 Nausea with vomiting, unspecified: Secondary | ICD-10-CM

## 2016-12-14 DIAGNOSIS — R197 Diarrhea, unspecified: Secondary | ICD-10-CM

## 2016-12-14 LAB — CBC
HEMATOCRIT: 39.1 % (ref 36.0–46.0)
Hemoglobin: 12.7 g/dL (ref 12.0–15.0)
MCH: 28.7 pg (ref 26.0–34.0)
MCHC: 32.5 g/dL (ref 30.0–36.0)
MCV: 88.3 fL (ref 78.0–100.0)
Platelets: 168 10*3/uL (ref 150–400)
RBC: 4.43 MIL/uL (ref 3.87–5.11)
RDW: 16.1 % — ABNORMAL HIGH (ref 11.5–15.5)
WBC: 4.3 10*3/uL (ref 4.0–10.5)

## 2016-12-14 LAB — COMPREHENSIVE METABOLIC PANEL
ALT: 20 U/L (ref 14–54)
AST: 28 U/L (ref 15–41)
Albumin: 3.6 g/dL (ref 3.5–5.0)
Alkaline Phosphatase: 42 U/L (ref 38–126)
Anion gap: 7 (ref 5–15)
BUN: 10 mg/dL (ref 6–20)
CO2: 27 mmol/L (ref 22–32)
CREATININE: 0.95 mg/dL (ref 0.44–1.00)
Calcium: 8.6 mg/dL — ABNORMAL LOW (ref 8.9–10.3)
Chloride: 103 mmol/L (ref 101–111)
GFR calc non Af Amer: >60 mL/min (ref >60)
Glucose, Bld: 103 mg/dL — ABNORMAL HIGH (ref 65–99)
POTASSIUM: 3.2 mmol/L — AB (ref 3.5–5.1)
Sodium: 137 mmol/L (ref 135–145)
TOTAL PROTEIN: 7.3 g/dL (ref 6.5–8.1)
Total Bilirubin: 0.7 mg/dL (ref 0.3–1.2)

## 2016-12-14 LAB — URINALYSIS, ROUTINE W REFLEX MICROSCOPIC
Bilirubin Urine: NEGATIVE
Glucose, UA: NEGATIVE mg/dL
Hgb urine dipstick: NEGATIVE
Ketones, ur: NEGATIVE mg/dL
LEUKOCYTES UA: NEGATIVE
Nitrite: NEGATIVE
Protein, ur: NEGATIVE mg/dL
Specific Gravity, Urine: 1.018 (ref 1.005–1.030)
pH: 5 (ref 5.0–8.0)

## 2016-12-14 LAB — LIPASE, BLOOD: LIPASE: 17 U/L (ref 11–51)

## 2016-12-14 MED ORDER — ONDANSETRON 4 MG PO TBDP: 4.0000 mg | ORAL_TABLET | Freq: Three times a day (TID) | ORAL | 0 refills | Status: DC | PRN

## 2016-12-14 MED ORDER — LOPERAMIDE HCL 2 MG PO CAPS: 2.0000 mg | ORAL_CAPSULE | Freq: Four times a day (QID) | ORAL | 0 refills | Status: DC | PRN

## 2016-12-14 MED ORDER — ACETAMINOPHEN 500 MG PO TABS
1000.0000 mg | ORAL_TABLET | Freq: Once | ORAL | Status: AC
  Administered 2016-12-14: 1000 mg via ORAL
  Filled 2016-12-14: qty 2

## 2016-12-14 MED ORDER — ONDANSETRON HCL 4 MG/2ML IJ SOLN
4.0000 mg | Freq: Once | INTRAMUSCULAR | Status: AC
  Administered 2016-12-14: 4 mg via INTRAVENOUS
  Filled 2016-12-14: qty 2

## 2016-12-14 MED ORDER — SODIUM CHLORIDE 0.9 % IV BOLUS (SEPSIS)
1000.0000 mL | Freq: Once | INTRAVENOUS | Status: AC
  Administered 2016-12-14: 1000 mL via INTRAVENOUS

## 2016-12-14 NOTE — ED Provider Notes (Signed)
Emergency Department Provider Note   I have reviewed the triage vital signs and the nursing notes.   HISTORY  Chief Complaint Emesis and Diarrhea   HPI [name] is a 51 y.o. female with PMH of DM, GERD, HTN, and DDD presents to the emergency department for evaluation of nausea, vomiting, diarrhea for the past 24 hours. Patient states she had some mild nausea with this but symptoms began suddenly and severely last night. She noted some bright red blood per rectum in her stools yesterday but that seems to have tapered off today. She does have a history of hemorrhoids. She has diffuse abdominal discomfort but nothing focal. No blood in her vomit. No fevers at home. She has been unable to tolerate liquids or solids. Unable to take medication. Symptoms made worse with eating. Nothing improved symptoms. No others sick at home.  The patient does endorse some mild left-sided chest pain that started after severe vomiting. No difficulty breathing. She has some remaining chest wall soreness at this time.    Past Medical History:  Diagnosis Date  . Anxiety   . Blood transfusion without reported diagnosis 1999   due to heavy menses  . Breast cancer ([place]) 05/22/2011   03/01/11, Stage 2, s/p lumpectomy, chemo/xrt  . DDD (degenerative disc disease), lumbar   . Depression 06/22/2011  . DM (diabetes mellitus) ([place]) 06/22/2011  . Genital warts   . GERD (gastroesophageal reflux disease) 06/22/2011  . Hx of adenomatous colonic polyps 10/2007   24mm sigmoid tubular adenoma, FH colon cancer, mother in mid-50s  . Hypercholesterolemia 06/22/2011  . Hypertension 06/22/2011  . 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 Iron deficiency anemia 03/25/2015  . Obesity 06/22/2011  . Shortness of breath   . 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 Vaginal bleeding 03/08/2014    Patient Active Problem List   Diagnosis Date Noted  . Rectal bleeding 09/06/2016   . Abdominal cramping 09/06/2016  . Fatty liver 09/06/2016  . Dehydration 12/15/2015  . Orthostatic dizziness 12/15/2015  . Nausea vomiting and diarrhea 12/15/2015  . Iron deficiency anemia 03/25/2015  . Excessive or frequent menstruation 04/08/2014  . Dysmenorrhea 04/08/2014  . H/O adenomatous polyp of colon 01/24/2012  . FH: colon cancer 01/24/2012  . Esophageal dysphagia 01/24/2012  . Chronic diarrhea 01/24/2012  . Depression 06/22/2011  . GERD (gastroesophageal reflux disease) 06/22/2011  . DM (diabetes mellitus) ([place]) 06/22/2011  . Hypercholesterolemia 06/22/2011  . Hypertension 06/22/2011  . Obesity 06/22/2011  . 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 back surg x2    . CHOLECYSTECTOMY    . COLONOSCOPY  11/03/07   4-mm sessile polyp removed/small internal hemorrhoids/tubular adenoma, random colon bx negative for microscopic colitis  . COLONOSCOPY WITH PROPOFOL N/A 09/17/2016   Procedure: COLONOSCOPY WITH PROPOFOL;  Surgeon: Daneil Dolin, MD;  [facility];  Service: Endoscopy;  Laterality: N/A;  9:00 am  . COLONOSCOPY, ESOPHAGOGASTRODUODENOSCOPY (EGD) AND ESOPHAGEAL DILATION  01/2012   SLF: empiric esophageal dilation, gastritis with benign bx, hemorrhoids  . DILATATION & CURETTAGE/HYSTEROSCOPY WITH MYOSURE N/A 02/10/2015   Procedure: DILATATION & CURETTAGE/HYSTEROSCOPY WITH MYOSURE;  Surgeon: Nunzio Cobbs, MD;  Location: [facility];  Service: Gynecology;  Laterality: N/A;  . MASTECTOMY PARTIAL / LUMPECTOMY     rt.  . MM BREAST STEREO BX*L*R/S  rt.  . POLYPECTOMY  09/17/2016   Procedure: POLYPECTOMY;  Surgeon: Daneil Dolin, MD;  [facility];  Service: Endoscopy;;  ascending colon  . PORT-A-CATH REMOVAL  05/26/2012   Procedure: REMOVAL PORT-A-CATH;  Surgeon: Jamesetta So, MD;  Location: [facility];  Service: General;  Laterality: N/A;  Minor Room  . PORTACATH  PLACEMENT      Current Outpatient Rx  . Order #: [id number] Class: Normal  . Order #: [id number] Class: Historical Med  . Order #: [id number] Class: Normal  . Order #: [id number] Class: Normal  . Order #: [id number] Class: Normal  . Order #: [id number] Class: Normal  . Order #: [id number] Class: Normal  . Order #: [id number] Class: Print  . Order #: [id number] Class: Print  . Order #: [id number] Class: Print    Allergies Pravastatin  Family History  Problem Relation Age of Onset  . Colon cancer Mother     mid-50s, died of metastatic disease  . Cancer Mother     liver  . Coronary artery disease Mother   . Diabetes type I Mother   . Coronary artery disease Father   . Diabetes Father   . Diabetes type I Father   . Hypertension Father   . Bipolar disorder Sister   . Coronary artery disease Brother   . Hypertension Brother   . Hypertension Maternal Grandmother   . Hyperlipidemia Maternal Grandmother   . Stroke Maternal Grandmother   . Hypertension Maternal Grandfather   . Diabetes Maternal Grandfather   . Diabetes Paternal Grandmother   . Heart disease Paternal Grandfather     Social History Social History  Substance Use Topics  . Smoking status: Never Smoker  . Smokeless tobacco: Never Used  . Alcohol use No    Review of Systems  Constitutional: No fever/chills Eyes: No visual changes. ENT: No sore throat. Cardiovascular: Denies chest pain. Respiratory: Denies shortness of breath. Gastrointestinal: Diffuse abdominal pain. Positive nausea, vomiting, and diarrhea.  No constipation. Genitourinary: Negative for dysuria. Musculoskeletal: Negative for back pain. Skin: Negative for rash. Neurological: Negative for headaches, focal weakness or numbness.  10-point ROS otherwise negative.  ____________________________________________   PHYSICAL EXAM:  VITAL SIGNS: ED Triage Vitals [12/14/16 1228]  Enc Vitals Group     BP 124/70     Pulse Rate 87     Resp 19     Temp 100.4  F (38 C)     Temp Source Temporal     SpO2 93 %     Weight 252 lb (114.3 kg)     Height 5\' 4"  (1.626 m)     Pain Score 9   Constitutional: Alert and oriented. Well appearing and in no acute distress. Eyes: Conjunctivae are normal.  Head: Atraumatic. Nose: No congestion/rhinnorhea. Mouth/Throat: Mucous membranes are dry. Neck: No stridor. Cardiovascular: Normal rate, regular rhythm. Good peripheral circulation. Grossly normal heart sounds.   Respiratory: Normal respiratory effort.  No retractions. Lungs CTAB. Gastrointestinal: Soft with diffuse mild tenderness. No distention.  Musculoskeletal: No lower extremity tenderness nor edema. No gross deformities of extremities. Neurologic:  Normal speech and language. No gross focal neurologic deficits are appreciated.  Skin:  Skin is warm, dry and intact. No rash noted. Psychiatric: Mood and affect are normal. Speech and behavior are normal.  ____________________________________________   LABS (all labs ordered are listed, but only abnormal results are displayed)  Labs Reviewed  COMPREHENSIVE METABOLIC PANEL - Abnormal; Notable for the following:       Result Value   Potassium 3.2 (*)  Glucose, Bld 103 (*)    Calcium 8.6 (*)    All other components within normal limits  CBC - Abnormal; Notable for the following:    RDW 16.1 (*)    All other components within normal limits  LIPASE, BLOOD  URINALYSIS, ROUTINE W REFLEX MICROSCOPIC   ____________________________________________  EKG   EKG Interpretation  Date/Time:  Friday December 14 2016 18:43:38 EST Ventricular Rate:  83 PR Interval:    QRS Duration: 78 QT Interval:  384 QTC Calculation: 452 R Axis:   31 Text Interpretation:  Sinus rhythm No STEMI.  Confirmed by  MD,  [phone]) on 12/15/2016 9:45:17 AM       ____________________________________________  RADIOLOGY  Dg Chest 2 View  Result Date: 12/14/2016 CLINICAL DATA:  Vomiting and diarrhea since last  evening. Shortness of breath and left-sided chest pain. EXAM: CHEST  2 VIEW COMPARISON:  07/03/2016; 07/17/2015 FINDINGS: Grossly unchanged cardiac silhouette and mediastinal contours. No focal airspace opacities. No pleural effusion or pneumothorax. No evidence of edema. No acute osseus abnormalities. Postsurgical change of the right breast. Mild scoliotic curvature of the thoracolumbar spine with associated multilevel mild to moderate DDD. Post cholecystectomy. IMPRESSION: No acute cardiopulmonary disease. Electronically Signed   By: Sandi Mariscal M.D.   On: 12/14/2016 17:15    ____________________________________________   PROCEDURES  Procedure(s) performed:   Procedures  None ____________________________________________   INITIAL IMPRESSION / ASSESSMENT AND PLAN / ED COURSE  Pertinent labs & imaging results that were available during my care of the patient were reviewed by me and considered in my medical decision making (see chart for details).  Patient resents to the emergency department for evaluation of nausea, vomiting, diarrhea over the last 24 hours. She has some left-sided chest discomfort that started after forceful vomiting. No blood in her emesis. Very low clinical suspicion for esophageal injury. Plan for chest x-ray and EKG. Extremely low suspicion for ACS in this case. Plan for IV fluids as labs are largely unremarkable. Urinalysis pending. Plan to treat symptomatically. No indication for abdominal imaging at this time.  08:48 PM Patient is feeling better after IVF and nausea medication. She is tolerating PO. Plan for discharge with symptoms mgmt at home and PCP follos up PRN.   At this time, I do not feel there is any life-threatening condition present. I have reviewed and discussed all results (EKG, imaging, lab, urine as appropriate), exam findings with patient. I have reviewed nursing notes and appropriate previous records.  I feel the patient is safe to be discharged  home without further emergent workup. Discussed usual and customary return precautions. Patient and family (if present) verbalize understanding and are comfortable with this plan.  Patient will follow-up with their primary care provider. If they do not have a primary care provider, information for follow-up has been provided to them. All questions have been answered.  ____________________________________________  FINAL CLINICAL IMPRESSION(S) / ED DIAGNOSES  Final diagnoses:  Dehydration  Nausea vomiting and diarrhea     MEDICATIONS GIVEN DURING THIS VISIT:  Medications  sodium chloride 0.9 % bolus 1,000 mL (0 mLs Intravenous Stopped 12/14/16 1920)  ondansetron (ZOFRAN) injection 4 mg (4 mg Intravenous Given 12/14/16 1844)  acetaminophen (TYLENOL) tablet 1,000 mg (1,000 mg Oral Given 12/14/16 1844)     NEW OUTPATIENT MEDICATIONS STARTED DURING THIS VISIT:  Discharge Medication List as of 12/14/2016  8:50 PM    START taking these medications   Details  loperamide (IMODIUM) 2 MG capsule Take 1 capsule (  2 mg total) by mouth 4 (four) times daily as needed for diarrhea or loose stools., Starting Fri 12/14/2016, Print    ondansetron (ZOFRAN ODT) 4 MG disintegrating tablet Take 1 tablet (4 mg total) by mouth every 8 (eight) hours as needed for nausea or vomiting., Starting Fri 12/14/2016, Print        Note:  This document was prepared using Dragon voice recognition software and may include unintentional dictation errors.  Nanda Quinton, MD Emergency Medicine   Margette Fast, MD 12/15/16 [phone]

## 2016-12-14 NOTE — Telephone Encounter (Signed)
Pt is aware and she will be coming into the office on 12/18/16 @ 9:45 for a hemorrhoid banding

## 2016-12-14 NOTE — Discharge Instructions (Signed)

## 2016-12-14 NOTE — ED Triage Notes (Signed)
Pt comes in with vomiting and diarrhea starting last night. Pt states her son had the same illness yesterday and she was in the car with him. Pt stats she has had bright red blood in her stools since having so many episodes of diarrhea. NAD noted.

## 2016-12-14 NOTE — Telephone Encounter (Signed)
PT IS AN RMR PT. 

## 2016-12-14 NOTE — Telephone Encounter (Signed)
We'll try to get her in next week for banding. In the interim, try OTC miconazole nitrate 2% cream to the anorectum 3 times a day when necessary

## 2016-12-17 ENCOUNTER — Encounter: Payer: Self-pay | Admitting: Family Medicine

## 2016-12-17 NOTE — Telephone Encounter (Signed)
Pt called this morning to change her appointment because she was in the ER over the weekend with FLU like signs.

## 2016-12-18 ENCOUNTER — Encounter: Payer: Self-pay | Admitting: Family Medicine

## 2016-12-18 ENCOUNTER — Ambulatory Visit: Payer: BLUE CROSS/BLUE SHIELD | Admitting: Internal Medicine

## 2016-12-18 ENCOUNTER — Encounter: Payer: BLUE CROSS/BLUE SHIELD | Admitting: Internal Medicine

## 2016-12-19 ENCOUNTER — Ambulatory Visit: Payer: BLUE CROSS/BLUE SHIELD | Admitting: Cardiology

## 2016-12-25 ENCOUNTER — Encounter: Payer: BLUE CROSS/BLUE SHIELD | Admitting: Internal Medicine

## 2016-12-25 ENCOUNTER — Encounter: Payer: Self-pay | Admitting: Internal Medicine

## 2016-12-25 ENCOUNTER — Ambulatory Visit (INDEPENDENT_AMBULATORY_CARE_PROVIDER_SITE_OTHER): Payer: BLUE CROSS/BLUE SHIELD | Admitting: Internal Medicine

## 2016-12-25 VITALS — BP 132/78 | HR 81 | Temp 97.8°F | Ht 64.0 in | Wt 256.0 lb

## 2016-12-25 DIAGNOSIS — K641 Second degree hemorrhoids: Secondary | ICD-10-CM | POA: Diagnosis not present

## 2016-12-25 DIAGNOSIS — K648 Other hemorrhoids: Secondary | ICD-10-CM

## 2016-12-25 NOTE — Progress Notes (Signed)
[place] banding procedure note:  The patient presents with symptomatic grade 2 hemorrhoids;  status post banding of right posterior column previously. Has a little perianal itching and burning. Did well with the prior band placement. She desires a second band be placed today. Not taking adequate fiber as previously recommended.  In the left lateral decubitus position, a DRE utilizing 2% Xylocaine cream and 0.125% nitroglycerin cream as lubricant revealed no abnormalities.  Perianal skin there did be a little red and inflamed within 3 cm of the anal canal circumferentially. The decision was made to band the left lateral internal hemorrhoid; the [place] was used to perform band ligation without complication. Digital anorectal examination was then performed to assure proper positioning of the band;  Band found to be in excellent position. No pinching or pain. The patient was discharged home without pain or other issues. Dietary and behavioral recommendations were given. Patient urged to take Benefiber 1 tablespoon twice daily. Also,  miconazole 2% cream to the perianal area 3 times a day.   The patient will return in 4 weeks for followup and possible additional banding as required.  No complications were encountered and the patient tolerated the procedure well.

## 2016-12-25 NOTE — Patient Instructions (Signed)
Avoid straining.  Benefiber 1 tablespoon  twice daily  Limit toilet time to 5 minutes  Call with any interim problems  Use OTC Miconazole cream to anal area 3x daily  Schedule followup appointment in 4 weeks from now

## 2017-01-09 DIAGNOSIS — F0631 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: Secondary | ICD-10-CM | POA: Insufficient documentation

## 2017-01-09 DIAGNOSIS — F410 Panic disorder [episodic paroxysmal anxiety] without agoraphobia: Secondary | ICD-10-CM | POA: Insufficient documentation

## 2017-01-11 ENCOUNTER — Encounter: Payer: Self-pay | Admitting: Cardiology

## 2017-01-11 ENCOUNTER — Ambulatory Visit (INDEPENDENT_AMBULATORY_CARE_PROVIDER_SITE_OTHER): Payer: BLUE CROSS/BLUE SHIELD | Admitting: Cardiology

## 2017-01-11 VITALS — BP 124/78 | HR 93 | Ht 64.0 in | Wt 248.0 lb

## 2017-01-11 DIAGNOSIS — R0789 Other chest pain: Secondary | ICD-10-CM

## 2017-01-11 DIAGNOSIS — E782 Mixed hyperlipidemia: Secondary | ICD-10-CM

## 2017-01-11 MED ORDER — ATORVASTATIN CALCIUM 40 MG PO TABS: 40.0000 mg | ORAL_TABLET | Freq: Every day | ORAL | 3 refills | Status: DC

## 2017-01-11 NOTE — Progress Notes (Signed)
Clinical Summary [name] is a 51 y.o.female seen as a new consult, referred by Dr Wolfgang Phoenix for the following medical problems.    1. Chest pain - symptoms started a few months ago. Pressure pain left chest into armpit, +SOB. Mainly occurs with emotional stress. 8/10 in severity. Can get dizzy, can get nauseous. Worst with deep breath, not positional. Not related to food. Lasts for about 1 minute. Occurs 2-3 days a week - can get some DOE. 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 Occasional LE edema in legs. - echo 06/2011 LVEF 55-60%, no WMAs.    CAD risk factors: DM2, HTN, hyperlipidemia, brother MI in 60s, father with CAD in 38s, mother with carotid blockages in her 9s.    2. Hyperliidemia - Jan 2018 TC 183 TG 271 HDL 47 LDL 82 Past Medical History:  Diagnosis Date  . Anxiety   . Blood transfusion without reported diagnosis 1999   due to heavy menses  . Breast cancer ([place]) 05/22/2011   03/01/11, Stage 2, s/p lumpectomy, chemo/xrt  . DDD (degenerative disc disease), lumbar   . Depression 06/22/2011  . DM (diabetes mellitus) ([place]) 06/22/2011  . Genital warts   . GERD (gastroesophageal reflux disease) 06/22/2011  . Hx of adenomatous colonic polyps 10/2007   80m sigmoid tubular adenoma, FH colon cancer, mother in mid-50s  . Hypercholesterolemia 06/22/2011  . Hypertension 06/22/2011  . 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 05/22/2011  . Iron deficiency anemia 03/25/2015  . Obesity 06/22/2011  . Shortness of breath   . 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 Vaginal bleeding 03/08/2014     Allergies  Allergen Reactions  . Pravastatin Other (See Comments)    Muscle aches     Current Outpatient Prescriptions  Medication Sig Dispense Refill  . albuterol (PROVENTIL HFA;VENTOLIN HFA) 108 (90 Base) MCG/ACT inhaler Inhale 2 puffs into the lungs every 6 (six) hours as needed for wheezing or shortness of breath. 1 Inhaler 2  .  ALPRAZolam (XANAX) 0.5 MG tablet Take 0.5 mg by mouth 5 (five) times daily as needed for anxiety.     .Marland Kitchenatorvastatin (LIPITOR) 10 MG tablet TAKE ONE TABLET BY MOUTH ONCE DAILY 30 tablet 0  . blood glucose meter kit and supplies KIT Dispense based on patient and insurance preference. TEST ONCE DAILY. DIABETES TYPE 2 ICD 10 CODE E11.9 1 each 5  . citalopram (CELEXA) 40 MG tablet Take 1 tablet (40 mg total) by mouth daily. 90 tablet 1  . lisinopril (PRINIVIL,ZESTRIL) 10 MG tablet Take 1 tablet (10 mg total) by mouth daily. 30 tablet 5  . loperamide (IMODIUM) 2 MG capsule Take 1 capsule (2 mg total) by mouth 4 (four) times daily as needed for diarrhea or loose stools. (Patient not taking: Reported on 12/25/2016) 12 capsule 0  . metFORMIN (GLUCOPHAGE) 500 MG tablet Take 1 tablet (500 mg total) by mouth 2 (two) times daily. 180 tablet 1  . ondansetron (ZOFRAN ODT) 4 MG disintegrating tablet Take 1 tablet (4 mg total) by mouth every 8 (eight) hours as needed for nausea or vomiting. (Patient not taking: Reported on 12/25/2016) 20 tablet 0  . pantoprazole (PROTONIX) 40 MG tablet TAKE ONE TABLET BY MOUTH ONCE DAILY FOR  ACID  REFLUX 90 tablet 0   No current facility-administered medications for this visit.      Past Surgical History:  Procedure Laterality Date  . back surg x2    .  CHOLECYSTECTOMY    . COLONOSCOPY  11/03/07   4-mm sessile polyp removed/small internal hemorrhoids/tubular adenoma, random colon bx negative for microscopic colitis  . COLONOSCOPY WITH PROPOFOL N/A 09/17/2016   Procedure: COLONOSCOPY WITH PROPOFOL;  Surgeon: Daneil Dolin, MD;  [facility];  Service: Endoscopy;  Laterality: N/A;  9:00 am  . COLONOSCOPY, ESOPHAGOGASTRODUODENOSCOPY (EGD) AND ESOPHAGEAL DILATION  01/2012   SLF: empiric esophageal dilation, gastritis with benign bx, hemorrhoids  . DILATATION & CURETTAGE/HYSTEROSCOPY WITH MYOSURE N/A 02/10/2015   Procedure: DILATATION & CURETTAGE/HYSTEROSCOPY WITH  MYOSURE;  Surgeon: Nunzio Cobbs, MD;  [facility];  Service: Gynecology;  Laterality: N/A;  . MASTECTOMY PARTIAL / LUMPECTOMY     rt.  . MM BREAST STEREO BX*L*R/S     rt.  Marland Kitchen POLYPECTOMY  09/17/2016   Procedure: POLYPECTOMY;  Surgeon: Daneil Dolin, MD;  [facility];  Service: Endoscopy;;  ascending colon  . PORT-A-CATH REMOVAL  05/26/2012   Procedure: REMOVAL PORT-A-CATH;  Surgeon: Jamesetta So, MD;  [facility];  Service: General;  Laterality: N/A;  Minor Room  . PORTACATH PLACEMENT       Allergies  Allergen Reactions  . Pravastatin Other (See Comments)    Muscle aches      Family History  Problem Relation Age of Onset  . Colon cancer Mother     mid-50s, died of metastatic disease  . Cancer Mother     liver  . Coronary artery disease Mother   . Diabetes type I Mother   . Coronary artery disease Father   . Diabetes Father   . Diabetes type I Father   . Hypertension Father   . Bipolar disorder Sister   . Coronary artery disease Brother   . Hypertension Brother   . Hypertension Maternal Grandmother   . Hyperlipidemia Maternal Grandmother   . Stroke Maternal Grandmother   . Hypertension Maternal Grandfather   . Diabetes Maternal Grandfather   . Diabetes Paternal Grandmother   . Heart disease Paternal Grandfather      Social History [name] reports that she has never smoked. She has never used smokeless tobacco. [name] reports that she does not drink alcohol.   Review of Systems CONSTITUTIONAL: No weight loss, fever, chills, weakness or fatigue.  HEENT: Eyes: No visual loss, blurred vision, double vision or yellow sclerae.No hearing loss, sneezing, congestion, runny nose or sore throat.  SKIN: No rash or itching.  CARDIOVASCULAR: per HPI RESPIRATORY: No shortness of breath, cough or sputum.  GASTROINTESTINAL: No anorexia, nausea, vomiting or diarrhea. No abdominal pain or blood.  GENITOURINARY: No burning on  urination, no polyuria NEUROLOGICAL: No headache, dizziness, syncope, paralysis, ataxia, numbness or tingling in the extremities. No change in bowel or bladder control.  MUSCULOSKELETAL: No muscle, back pain, joint pain or stiffness.  LYMPHATICS: No enlarged nodes. No history of splenectomy.  PSYCHIATRIC: No history of depression or anxiety.  ENDOCRINOLOGIC: No reports of sweating, cold or heat intolerance. No polyuria or polydipsia.  Marland Kitchen   Physical Examination Vitals:   01/11/17 1011  BP: 124/78  Pulse: 93   Vitals:   01/11/17 1011  Weight: 248 lb (112.5 kg)  Height: _0  (1.626 m)    Gen: resting comfortably, no acute distress HEENT: no scleral icterus, pupils equal round and reactive, no palptable cervical adenopathy,  CV: RRR, no m/r/g, no jvd Resp: Clear to auscultation bilaterally GI: abdomen is soft, non-tender, non-distended, normal bowel sounds, no hepatosplenomegaly MSK:  extremities are warm, no edema.  Skin: warm, no rash Neuro:  no focal deficits Psych: appropriate affect      Assessment and Plan  1. Chest pain - multiple CAD risk factors.  - we will obtain a nuclear stress test 2 day protocol to further evaluate.   2. Hyperlipidemia - in setting of DM2 would recommend at least moderate strength statin, will increase atova to 14m daily.    F/u pending stress tesults   JArnoldo Lenis M.D.,

## 2017-01-11 NOTE — Patient Instructions (Addendum)
Your physician recommends that you schedule a follow-up appointment in: to be determined after [place] physician has requested that you have a lexiscan myoview. For further information please visit [email or web address]. Please follow instruction sheet, as given.   HOLD METFORMIN THE MORNING OF TEST  INCREASE Atorvastatin to 40 mg at dinner      Thank you for choosing [facility] !

## 2017-01-16 NOTE — Progress Notes (Signed)
51 y.o. G4P0013 Legally Separated Caucasian female here for annual exam.     Spotting in January and February.  Some hot flashes.   Pain with intercourse.   Desires STD testing.   Having urinary frequency and burning.  Cramping with urination.  Some loss of urine with sneeze or cough.  No fever.  Some nausea.  Some back pain.  Had pyelonephritis last month.  Treated with ?Levofloxin.  Has chest pain, bloating, crazing ice, muscle weakness, depression.  Had stress test done yesterday.   FH of early CAD.  On Celexa.  This is helping her depression.   PCP:  Baltazar Apo, MD   Patient's last menstrual period was 04/05/2016.           Sexually active: Yes.   female The current method of family planning is none.    Exercising: No.   Smoker:  no  Health Maintenance: Pap: 09-08-14 Ascus:Pos HR HPV;  History of abnormal Pap:  Yes, 09-08-14 Ascus:Pos HR HPV with colposcopy revealing LGSIL on exocervical biopsy/2010 abnormal pap with colposcopy but no treatment to cervix.  Repeat pap was normal.  1990's had colposcopy with LEEP procedure to cervix. Had laser to condyloma in 1989 or 1990. MMG:  03-06-16 Hx Rt.Br.CA & lumpectomy/Diag.Bil./Density B/Neg/BiRads2/Rec.Diag.1year:[facility] Colonoscopy:  01-29-12 negative Dr.Neijstrom;but hx of polyps and family hix--next due 01/2017.  Having a banding of a hemorrhoid today.  BMD:   n/a  Result  n/a TDaP:  06-01-15 Gardasil:   N/A   Screening Labs:  Hb today: PCP, Urine today: 1+WBCs,Tr.RBCs--pt.c/o urinary frequency/burning   reports that she has never smoked. She has never used smokeless tobacco. She reports that she does not drink alcohol or use drugs.  Past Medical History:  Diagnosis Date  . Anxiety   . Blood transfusion without reported diagnosis 1999   due to heavy menses  . Breast cancer ([place]) 05/22/2011   03/01/11, Stage 2, s/p lumpectomy, chemo/xrt  . DDD (degenerative disc disease), lumbar   . Depression 06/22/2011  .  DM (diabetes mellitus) ([place]) 06/22/2011  . Genital warts   . GERD (gastroesophageal reflux disease) 06/22/2011  . Hx of adenomatous colonic polyps 10/2007   75m sigmoid tubular adenoma, FH colon cancer, mother in mid-50s  . Hypercholesterolemia 06/22/2011  . Hypertension 06/22/2011  . 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 05/[place]  . Iron deficiency anemia 03/25/2015  . Obesity 06/22/2011  . Shortness of breath   . 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 Vaginal bleeding 03/08/2014    Past Surgical History:  Procedure Laterality Date  . back surg x2    . CHOLECYSTECTOMY    . COLONOSCOPY  11/03/07   4-mm sessile polyp removed/small internal hemorrhoids/tubular adenoma, random colon bx negative for microscopic colitis  . COLONOSCOPY WITH PROPOFOL N/A 09/17/2016   Procedure: COLONOSCOPY WITH PROPOFOL;  Surgeon: RDaneil Dolin MD;  [facility];  Service: Endoscopy;  Laterality: N/A;  9:00 am  . COLONOSCOPY, ESOPHAGOGASTRODUODENOSCOPY (EGD) AND ESOPHAGEAL DILATION  01/2012   SLF: empiric esophageal dilation, gastritis with benign bx, hemorrhoids  . DILATATION & CURETTAGE/HYSTEROSCOPY WITH MYOSURE N/A 02/10/2015   Procedure: DILATATION & CURETTAGE/HYSTEROSCOPY WITH MYOSURE;  Surgeon: BNunzio Cobbs MD;  [facility];  Service: Gynecology;  Laterality: N/A;  . MASTECTOMY PARTIAL / LUMPECTOMY     rt.  . MM BREAST STEREO BX*L*R/S     rt.  .Marland KitchenPOLYPECTOMY  09/17/2016  Procedure: POLYPECTOMY;  Surgeon: Daneil Dolin, MD;  [facility];  Service: Endoscopy;;  ascending colon  . PORT-A-CATH REMOVAL  05/26/2012   Procedure: REMOVAL PORT-A-CATH;  Surgeon: Jamesetta So, MD;  Location: [facility];  Service: General;  Laterality: N/A;  Minor Room  . PORTACATH PLACEMENT      Current Outpatient Prescriptions  Medication Sig Dispense Refill  . albuterol (PROVENTIL HFA;VENTOLIN HFA) 108 (90 Base) MCG/ACT inhaler Inhale 2 puffs into  the lungs every 6 (six) hours as needed for wheezing or shortness of breath. (Patient not taking: Reported on 01/11/2017) 1 Inhaler 2  . atorvastatin (LIPITOR) 40 MG tablet Take 1 tablet (40 mg total) by mouth daily. 90 tablet 3  . blood glucose meter kit and supplies KIT Dispense based on patient and insurance preference. TEST ONCE DAILY. DIABETES TYPE 2 ICD 10 CODE E11.9 1 each 5  . citalopram (CELEXA) 40 MG tablet Take 1 tablet (40 mg total) by mouth daily. 90 tablet 1  . lisinopril (PRINIVIL,ZESTRIL) 10 MG tablet Take 1 tablet (10 mg total) by mouth daily. 30 tablet 5  . metFORMIN (GLUCOPHAGE) 500 MG tablet Take 1 tablet (500 mg total) by mouth 2 (two) times daily. 180 tablet 1  . pantoprazole (PROTONIX) 40 MG tablet TAKE ONE TABLET BY MOUTH ONCE DAILY FOR  ACID  REFLUX 90 tablet 0   No current facility-administered medications for this visit.     Family History  Problem Relation Age of Onset  . Colon cancer Mother     mid-50s, died of metastatic disease  . Cancer Mother     liver  . Coronary artery disease Mother   . Diabetes type I Mother   . Coronary artery disease Father   . Diabetes Father   . Diabetes type I Father   . Hypertension Father   . Bipolar disorder Sister   . Coronary artery disease Brother   . Hypertension Brother   . Hypertension Maternal Grandmother   . Hyperlipidemia Maternal Grandmother   . Stroke Maternal Grandmother   . Hypertension Maternal Grandfather   . Diabetes Maternal Grandfather   . Diabetes Paternal Grandmother   . Heart disease Paternal Grandfather     ROS:  Pertinent items are noted in HPI.  Otherwise, a comprehensive ROS was negative.  Exam:   BP 138/80 (BP Location: Left Arm, Patient Position: Sitting, Cuff Size: Large)   Pulse 88   Resp 16   Ht 5' 3.5" (1.613 m)   Wt 252 lb 9.6 oz (114.6 kg)   LMP 04/05/2016 Comment: spotting 11/2016 and 12/2016  BMI 44.04 kg/m     General appearance: alert, cooperative and appears stated age Head:  Normocephalic, without obvious abnormality, atraumatic Neck: no adenopathy, supple, symmetrical, trachea midline and thyroid normal to inspection and palpation Lungs: clear to auscultation bilaterally Breasts: normal appearance on left breast and right breast with scar, no masses or tenderness, No nipple retraction or dimpling, No nipple discharge or bleeding, No axillary or supraclavicular adenopathy Heart: regular rate and rhythm Abdomen: soft, non-tender; no masses, no organomegaly Extremities: extremities normal, atraumatic, no cyanosis or edema Skin: Skin color, texture, turgor normal. No rashes or lesions Lymph nodes: Cervical, supraclavicular, and axillary nodes normal. No abnormal inguinal nodes palpated Neurologic: Grossly normal  Pelvic: External genitalia:  no lesions              Urethra:  normal appearing urethra with no masses, tenderness or lesions  Bartholins and Skenes: normal                 Vagina: normal appearing vagina with normal color and discharge, no lesions              Cervix: no lesions              Pap taken: Yes.   Bimanual Exam:  Uterus:  normal size, contour, position, consistency, mobility, non-tender              Adnexa: no mass, fullness, tenderness              Rectal exam:  Deferred.  Having rectal hemorrhoid banding today.   Chaperone was present for exam.  Assessment:   Well woman visit with normal exam. Hx right breast cancer.  Hx LGSIL.  Prior LEEP.  Hx fibroids.  Amenorrhea.  Menopausal symptoms. Vaginal atrophy.  FH of colon cancer in mother.  Dysuria.  Recent pyelonephritis.  FH CAD.  Personal hx of CP.   Plan: Mammogram screening discussed.  Due next month.  Recommended self breast awareness. Pap and HR HPV as above. Guidelines for Calcium, Vitamin D, regular exercise program including cardiovascular and weight bearing exercise. Urine micro and culture.  Ciprofloxacin 500 mg po bid for 7 days.  FSH and  estradiol. STD screening.  I discussed water based lubricants and cooking oils for vaginal dryness. Follow up annually and prn.      After visit summary provided.

## 2017-01-17 ENCOUNTER — Encounter (HOSPITAL_COMMUNITY)
Admission: RE | Admit: 2017-01-17 | Discharge: 2017-01-17 | Disposition: A | Discharge status: Home / Self Care | Payer: BLUE CROSS/BLUE SHIELD | Source: Ambulatory Visit | Attending: Cardiology | Admitting: Cardiology

## 2017-01-17 ENCOUNTER — Encounter (HOSPITAL_COMMUNITY): Payer: Self-pay

## 2017-01-17 ENCOUNTER — Inpatient Hospital Stay (HOSPITAL_COMMUNITY): Admission: RE | Admit: 2017-01-17 | Payer: BLUE CROSS/BLUE SHIELD | Source: Ambulatory Visit

## 2017-01-17 DIAGNOSIS — R0789 Other chest pain: Secondary | ICD-10-CM

## 2017-01-17 LAB — NM MYOCAR MULTI W/SPECT W/WALL MOTION / EF
CHL CUP RESTING HR STRESS: 88 {beats}/min
LV dias vol: 76 mL (ref 46–106)
LV sys vol: 34 mL
NUC STRESS TID: 1
Peak HR: 130 {beats}/min
RATE: 0.32
SDS: 3
SRS: 4
SSS: 7

## 2017-01-17 MED ORDER — TECHNETIUM TC 99M TETROFOSMIN IV KIT
10.0000 | PACK | Freq: Once | INTRAVENOUS | Status: AC | PRN
  Administered 2017-01-17: 10.5 via INTRAVENOUS

## 2017-01-17 MED ORDER — REGADENOSON 0.4 MG/5ML IV SOLN
INTRAVENOUS | Status: AC
  Administered 2017-01-17: 0.4 mg via INTRAVENOUS
  Filled 2017-01-17: qty 5

## 2017-01-17 MED ORDER — SODIUM CHLORIDE 0.9% FLUSH
INTRAVENOUS | Status: AC
  Administered 2017-01-17: 10 mL via INTRAVENOUS
  Filled 2017-01-17: qty 10

## 2017-01-17 MED ORDER — TECHNETIUM TC 99M TETROFOSMIN IV KIT
30.0000 | PACK | Freq: Once | INTRAVENOUS | Status: AC | PRN
  Administered 2017-01-17: 32.5 via INTRAVENOUS

## 2017-01-18 ENCOUNTER — Ambulatory Visit (INDEPENDENT_AMBULATORY_CARE_PROVIDER_SITE_OTHER): Payer: BLUE CROSS/BLUE SHIELD | Admitting: Internal Medicine

## 2017-01-18 ENCOUNTER — Ambulatory Visit (INDEPENDENT_AMBULATORY_CARE_PROVIDER_SITE_OTHER): Payer: BLUE CROSS/BLUE SHIELD | Admitting: Obstetrics and Gynecology

## 2017-01-18 ENCOUNTER — Encounter: Payer: Self-pay | Admitting: Internal Medicine

## 2017-01-18 ENCOUNTER — Encounter: Payer: BLUE CROSS/BLUE SHIELD | Admitting: Internal Medicine

## 2017-01-18 ENCOUNTER — Encounter: Payer: Self-pay | Admitting: Obstetrics and Gynecology

## 2017-01-18 VITALS — BP 138/80 | HR 88 | Resp 16 | Ht 63.5 in | Wt 252.6 lb

## 2017-01-18 VITALS — BP 130/82 | HR 81 | Temp 98.3°F | Ht 63.5 in | Wt 250.6 lb

## 2017-01-18 DIAGNOSIS — N951 Menopausal and female climacteric states: Secondary | ICD-10-CM

## 2017-01-18 DIAGNOSIS — R8299 Other abnormal findings in urine: Secondary | ICD-10-CM | POA: Diagnosis not present

## 2017-01-18 DIAGNOSIS — K648 Other hemorrhoids: Secondary | ICD-10-CM

## 2017-01-18 DIAGNOSIS — Z0000 Encounter for general adult medical examination without abnormal findings: Secondary | ICD-10-CM | POA: Diagnosis not present

## 2017-01-18 DIAGNOSIS — R82998 Other abnormal findings in urine: Secondary | ICD-10-CM

## 2017-01-18 DIAGNOSIS — Z113 Encounter for screening for infections with a predominantly sexual mode of transmission: Secondary | ICD-10-CM

## 2017-01-18 DIAGNOSIS — Z01419 Encounter for gynecological examination (general) (routine) without abnormal findings: Secondary | ICD-10-CM | POA: Diagnosis not present

## 2017-01-18 LAB — POCT URINALYSIS DIPSTICK
Bilirubin, UA: NEGATIVE
Glucose, UA: NEGATIVE
Ketones, UA: NEGATIVE
Nitrite, UA: NEGATIVE
PH UA: 5
Protein, UA: NEGATIVE
Urobilinogen, UA: NEGATIVE

## 2017-01-18 LAB — URINALYSIS, MICROSCOPIC ONLY
Bacteria, UA: NONE SEEN [HPF]
CASTS: NONE SEEN [LPF]
CRYSTALS: NONE SEEN [HPF]
Squamous Epithelial / LPF: NONE SEEN [HPF] (ref <=5)
YEAST: NONE SEEN [HPF]

## 2017-01-18 MED ORDER — CIPROFLOXACIN HCL 500 MG PO TABS: 500.0000 mg | ORAL_TABLET | Freq: Two times a day (BID) | ORAL | 0 refills | Status: DC

## 2017-01-18 NOTE — Patient Instructions (Signed)

## 2017-01-18 NOTE — Patient Instructions (Signed)
Avoid straining.  Benefiber 1 tablespoon twice daily  Limit toilet time to 5 minutes  Call with any interim problems  Schedule followup appointment in 6 months   

## 2017-01-18 NOTE — Progress Notes (Signed)
[place] banding procedure note:  The patient presents with symptomatic grade 2 hemorrhoids;  Much improved after 2 previous bands placed. She desires a third and final band be placed today. All risks, benefits, and alternative forms of therapy were described and informed consent was obtained.  In the left lateral decubitus position, a DRE revealed no abnormalities. Nitroglycerin 0.25% and Xylocaine cream 2% was used as lubricant.  The decision was made to band the right anterior internal hemorrhoid;the [place] was used to perform band ligation without complication. Digital anorectal examination was then performed to assure proper positioning of the band;  first band minimally engaged. A second band was deployed adjacent to it. Second band appeared in excellent position by palpation. No pinching or pain. The patient was discharged home without pain or other issues.   Dietary and behavioral recommendations were given.  Office visit with [place] in 6 months.  No complications were encountered and the patient tolerated the procedure well.

## 2017-01-19 LAB — STD PANEL
HEP B S AG: NEGATIVE
HIV 1&2 Ab, 4th Generation: NONREACTIVE

## 2017-01-19 LAB — FOLLICLE STIMULATING HORMONE: FSH: 23.5 m[IU]/mL

## 2017-01-19 LAB — URINE CULTURE

## 2017-01-19 LAB — HEPATITIS C ANTIBODY: HCV Ab: NEGATIVE

## 2017-01-19 LAB — ESTRADIOL: Estradiol: 18 pg/mL

## 2017-01-22 LAB — IPS N GONORRHOEA AND CHLAMYDIA BY PCR

## 2017-01-23 LAB — IPS PAP TEST WITH HPV

## 2017-02-01 ENCOUNTER — Other Ambulatory Visit: Payer: Self-pay | Admitting: Family Medicine

## 2017-02-07 ENCOUNTER — Encounter: Payer: Self-pay | Admitting: Physician Assistant

## 2017-02-07 NOTE — Progress Notes (Deleted)
Cardiology Office Note    Date:  02/07/2017  ID:  [name], DOB 24-Nov-1965, MRN [id number] PCP:  Mickie Hillier, MD  Cardiologist:  Dr. Harl Bowie  Chief Complaint: f/u chest pain  History of Present Illness:  [name] is a 51 y.o. female with history of breast cancer, anxiety, depression, DM, DDD, GERD, HTN, HLD, morbid obesity, anemia who presents for f/u of chest pain. Recently saw Dr. Harl Bowie 01/11/17 with CP x several months, mostly with emotional stress, pressure in left chest into armpit a/w dizziness or nausea, some DOE. + Family hx of CAD. Remote echo 06/2011: EF 55-60%, no RWMA. Nuc 01/17/17 was low risk with small, mild intensity, mid to apical anteroseptal defect that is partially reversible, suspect variable breast attenuation artifact rather than a small ischemic territory, EF 55%. Last labs 11/2016: K 3.4, Cr 0.95, LFTs ok, lipase wnl, CBC OK Hgb 12.7, LDL 82, trig 271.  Chest pain unspecified Hypertension Hyperlipidemia Morbid obesity   Past Medical History:  Diagnosis Date  . Anxiety   . Blood transfusion without reported diagnosis 1999   due to heavy menses  . Breast cancer ([place]) 05/22/2011   03/01/11, Stage 2, s/p lumpectomy, chemo/xrt  . DDD (degenerative disc disease), lumbar   . Depression 06/22/2011  . DM (diabetes mellitus) ([place]) 06/22/2011  . Genital warts   . GERD (gastroesophageal reflux disease) 06/22/2011  . Hx of adenomatous colonic polyps 10/2007   75m sigmoid tubular adenoma, FH colon cancer, mother in mid-50s  . Hypercholesterolemia 06/22/2011  . Hypertension 06/22/2011  . 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  . Iron deficiency anemia 03/25/2015  . Obesity 06/22/2011  . Shortness of breath   . 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 Vaginal bleeding 03/08/2014    Past Surgical History:  Procedure Laterality Date  . back surg x2    . CHOLECYSTECTOMY    . COLONOSCOPY  11/03/07   4-mm  sessile polyp removed/small internal hemorrhoids/tubular adenoma, random colon bx negative for microscopic colitis  . COLONOSCOPY WITH PROPOFOL N/A 09/17/2016   Procedure: COLONOSCOPY WITH PROPOFOL;  Surgeon: RDaneil Dolin MD;  [facility];  Service: Endoscopy;  Laterality: N/A;  9:00 am  . COLONOSCOPY, ESOPHAGOGASTRODUODENOSCOPY (EGD) AND ESOPHAGEAL DILATION  01/2012   SLF: empiric esophageal dilation, gastritis with benign bx, hemorrhoids  . DILATATION & CURETTAGE/HYSTEROSCOPY WITH MYOSURE N/A 02/10/2015   Procedure: DILATATION & CURETTAGE/HYSTEROSCOPY WITH MYOSURE;  Surgeon: BNunzio Cobbs MD;  [facility];  Service: Gynecology;  Laterality: N/A;  . MASTECTOMY PARTIAL / LUMPECTOMY     rt.  . MM BREAST STEREO BX*L*R/S     rt.  .Marland KitchenPOLYPECTOMY  09/17/2016   Procedure: POLYPECTOMY;  Surgeon: RDaneil Dolin MD;  [facility];  Service: Endoscopy;;  ascending colon  . PORT-A-CATH REMOVAL  05/26/2012   Procedure: REMOVAL PORT-A-CATH;  Surgeon: MJamesetta So MD;  Location: [facility];  Service: General;  Laterality: N/A;  Minor Room  . PORTACATH PLACEMENT      Current Medications: Outpatient Medications Prior to Visit  Medication Sig Dispense Refill  . albuterol (PROVENTIL HFA;VENTOLIN HFA) 108 (90 Base) MCG/ACT inhaler Inhale 2 puffs into the lungs every 6 (six) hours as needed for wheezing or shortness of breath. 1 Inhaler 2  . atorvastatin (LIPITOR) 40 MG tablet Take 1 tablet (40 mg total) by mouth daily. 90 tablet 3  . blood glucose  meter kit and supplies KIT Dispense based on patient and insurance preference. TEST ONCE DAILY. DIABETES TYPE 2 ICD 10 CODE E11.9 1 each 5  . ciprofloxacin (CIPRO) 500 MG tablet Take 1 tablet (500 mg total) by mouth 2 (two) times daily. 14 tablet 0  . citalopram (CELEXA) 40 MG tablet Take 1 tablet (40 mg total) by mouth daily. 90 tablet 1  . diazepam (VALIUM) 5 MG tablet Take 1 tablet by mouth as needed.  0  . lisinopril  (PRINIVIL,ZESTRIL) 10 MG tablet Take 1 tablet (10 mg total) by mouth daily. 30 tablet 5  . metFORMIN (GLUCOPHAGE) 500 MG tablet Take 1 tablet (500 mg total) by mouth 2 (two) times daily. 180 tablet 1  . pantoprazole (PROTONIX) 40 MG tablet TAKE ONE TABLET BY MOUTH ONCE DAILY FOR  ACID  REFLUX 90 tablet 0   No facility-administered medications prior to visit.      Allergies:   Pravastatin   Social History   Social History  . Marital status: Legally Separated    Spouse name: N/A  . Number of children: 3  . Years of education: N/A   Occupational History  . child care [employer]   Social History Main Topics  . Smoking status: Never Smoker  . Smokeless tobacco: Never Used  . Alcohol use No  . Drug use: No  . Sexual activity: Yes    Partners: Male    Birth control/ protection: None   Other Topics Concern  . Not on file   Social History Narrative  . No narrative on file     Family History:  Family History  Problem Relation Age of Onset  . Colon cancer Mother     mid-50s, died of metastatic disease  . Cancer Mother     liver  . Coronary artery disease Mother   . Diabetes type I Mother   . Peripheral vascular disease Mother     Carotid disease in her 71s  . Coronary artery disease Father     CAD in his 57s  . Diabetes Father   . Diabetes type I Father   . Hypertension Father   . Bipolar disorder Sister   . Coronary artery disease Brother     MI in his 50s  . Hypertension Brother   . Hypertension Maternal Grandmother   . Hyperlipidemia Maternal Grandmother   . Stroke Maternal Grandmother   . Hypertension Maternal Grandfather   . Diabetes Maternal Grandfather   . Diabetes Paternal Grandmother   . Heart disease Paternal Grandfather    ***  ROS:   Please see the history of present illness. Otherwise, review of systems is positive for ***.  All other systems are reviewed and otherwise negative.    PHYSICAL EXAM:   VS:  LMP 04/05/2016 Comment: spotting  11/2016 and 12/2016  BMI: There is no height or weight on file to calculate BMI. GEN: Well nourished, well developed, in no acute distress  HEENT: normocephalic, atraumatic Neck: no JVD, carotid bruits, or masses Cardiac: ***RRR; no murmurs, rubs, or gallops, no edema  Respiratory:  clear to auscultation bilaterally, normal work of breathing GI: soft, nontender, nondistended, + BS MS: no deformity or atrophy  Skin: warm and dry, no rash Neuro:  Alert and Oriented x 3, Strength and sensation are intact, follows commands Psych: euthymic mood, full affect  Wt Readings from Last 3 Encounters:  01/18/17 250 lb 9.6 oz (113.7 kg)  01/18/17 252 lb 9.6 oz (114.6 kg)  01/11/17 248 lb (112.5 kg)      Studies/Labs Reviewed:   EKG:  EKG was ordered today and personally reviewed by me and demonstrates *** EKG was not ordered today.***  Recent Labs: 12/14/2016: ALT 20; BUN 10; Creatinine, Ser 0.95; Hemoglobin 12.7; Platelets 168; Potassium 3.2; Sodium 137   Lipid Panel    Component Value Date/Time   CHOL 183 12/07/2016 1137   TRIG 271 (H) 12/07/2016 1137   HDL 47 12/07/2016 1137   CHOLHDL 3.9 12/07/2016 1137   CHOLHDL 4.8 06/04/2014 0919   VLDL 36 06/04/2014 0919   LDLCALC 82 12/07/2016 1137    Additional studies/ records that were reviewed today include: Summarized above.***    ASSESSMENT & PLAN:   1. ***  Disposition: F/u with ***   Medication Adjustments/Labs and Tests Ordered: Current medicines are reviewed at length with the patient today.  Concerns regarding medicines are outlined above. Medication changes, Labs and Tests ordered today are summarized above and listed in the Patient Instructions accessible in Encounters.   Raechel Ache PA-C  02/07/2017 4:39 PM    [facility] Location in [address]. [place], [place] [address] Ph: [phone]; Fax [phone]

## 2017-02-12 ENCOUNTER — Ambulatory Visit: Payer: BLUE CROSS/BLUE SHIELD | Admitting: Physician Assistant

## 2017-02-15 ENCOUNTER — Encounter: Payer: Self-pay | Admitting: Family Medicine

## 2017-02-15 ENCOUNTER — Ambulatory Visit (INDEPENDENT_AMBULATORY_CARE_PROVIDER_SITE_OTHER): Payer: BLUE CROSS/BLUE SHIELD | Admitting: Family Medicine

## 2017-02-15 VITALS — BP 126/90 | Temp 98.3°F | Ht 64.0 in | Wt 250.4 lb

## 2017-02-15 DIAGNOSIS — J329 Chronic sinusitis, unspecified: Secondary | ICD-10-CM | POA: Diagnosis not present

## 2017-02-15 MED ORDER — HYDROCODONE-HOMATROPINE 5-1.5 MG/5ML PO SYRP: ORAL_SOLUTION | ORAL | 0 refills | Status: DC

## 2017-02-15 MED ORDER — AMOXICILLIN-POT CLAVULANATE 875-125 MG PO TABS: 1.0000 | ORAL_TABLET | Freq: Two times a day (BID) | ORAL | 0 refills | Status: AC

## 2017-02-15 NOTE — Progress Notes (Signed)
   Subjective:    Patient ID: [name], female    DOB: 10-May-1966, 51 y.o.   MRN: [id number]  Cough  This is a new problem. The current episode started in the past 7 days. Associated symptoms include ear pain, rhinorrhea, a sore throat and wheezing. Treatments tried: Nyquil.   A wek ago started with bad cough  And had dranage yellow   Pos gunky productiv estuf, no fever   wheeziness any old time   No inhaler this morn   Energy poor   No sleep trouble clogged up and congested, pos nasal disch dim sleep   Patient has concerns of left arm pain, and right leg and ankle weakness.   Review of Systems  HENT: Positive for ear pain, rhinorrhea and sore throat.   Respiratory: Positive for cough and wheezing.        Objective:   Physical Exam  Alert, mild malaise. Hydration good Vitals stable. frontal/ maxillary tenderness evident positive nasal congestion. pharynx normal neck supple  lungs clear/no crackles or wheezes. heart regular in rhythm       Assessment & Plan:  Impression rhinosinusitis likely post viral, discussed with patient. plan antibiotics prescribed. Questions answered. Symptomatic care discussed. warning signs discussed. WSL Encouraged to f u in everal weeks for chronic concerns analysis Week or

## 2017-02-18 ENCOUNTER — Other Ambulatory Visit (HOSPITAL_COMMUNITY): Payer: Self-pay | Admitting: Oncology

## 2017-02-18 DIAGNOSIS — C50911 Malignant neoplasm of unspecified site of right female breast: Secondary | ICD-10-CM

## 2017-02-27 ENCOUNTER — Encounter: Payer: Self-pay | Admitting: Physician Assistant

## 2017-02-27 ENCOUNTER — Ambulatory Visit (INDEPENDENT_AMBULATORY_CARE_PROVIDER_SITE_OTHER): Payer: BLUE CROSS/BLUE SHIELD | Admitting: Physician Assistant

## 2017-02-27 VITALS — BP 124/82 | HR 82 | Ht 64.0 in | Wt 251.0 lb

## 2017-02-27 DIAGNOSIS — E7800 Pure hypercholesterolemia, unspecified: Secondary | ICD-10-CM

## 2017-02-27 DIAGNOSIS — E119 Type 2 diabetes mellitus without complications: Secondary | ICD-10-CM

## 2017-02-27 DIAGNOSIS — I10 Essential (primary) hypertension: Secondary | ICD-10-CM

## 2017-02-27 DIAGNOSIS — R079 Chest pain, unspecified: Secondary | ICD-10-CM | POA: Insufficient documentation

## 2017-02-27 NOTE — Progress Notes (Signed)
Cardiology Office Note    Date:  02/27/2017   ID:  [name], DOB May 24, 1966, MRN [id number]  PCP:  Mickie Hillier, MD  Cardiologist: Dr. Harl Bowie   Chief Complaint  Patient presents with  . Follow-up    History of Present Illness:  [name] is a 51 y.o. female who saw Dr. Harl Bowie as a new patient 01/11/17 for chest pain. Occurred mainly with emotional stress. Also has DM2, HTN, HLD, family hx of early CAD.Echo 2012 LVEF 55-60% no WMA. Nuclear stress test showed no ST changes but a small mild intensity mid to apical anterior septal defect is partially reversible. Suspect variable breast attenuation artifact rather than small ischemic territory. Felt to be a low risk study. LVEF 55%.  Patient comes in today for follow-up. She says that every now and then has a sharp shooting chest pain that lasts only a second. It usually occurs while at work if she is under stress. Occurs about once a day. She also has a slight aching in her chest that usually related to her indigestion and meals. She denies any exertional chest tightness. Walks on the weekend without symptoms.    Past Medical History:  Diagnosis Date  . Anxiety   . Blood transfusion without reported diagnosis 1999   due to heavy menses  . Breast cancer ([place]) 05/22/2011   03/01/11, Stage 2, s/p lumpectomy, chemo/xrt  . DDD (degenerative disc disease), lumbar   . Depression 06/22/2011  . DM (diabetes mellitus) ([place]) 06/22/2011  . Genital warts   . GERD (gastroesophageal reflux disease) 06/22/2011  . Hx of adenomatous colonic polyps 10/2007   16m sigmoid tubular adenoma, FH colon cancer, mother in mid-50s  . Hypercholesterolemia 06/22/2011  . Hypertension 06/22/2011  . 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[facility] 05/22/2011  . Iron deficiency anemia 03/25/2015  . Morbid obesity ([place]   . Shortness of breath   . 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 Vaginal bleeding 03/08/2014     Past Surgical History:  Procedure Laterality Date  . back surg x2    . CHOLECYSTECTOMY    . COLONOSCOPY  11/03/07   4-mm sessile polyp removed/small internal hemorrhoids/tubular adenoma, random colon bx negative for microscopic colitis  . COLONOSCOPY WITH PROPOFOL N/A 09/17/2016   Procedure: COLONOSCOPY WITH PROPOFOL;  Surgeon: RDaneil Dolin MD;  [facility];  Service: Endoscopy;  Laterality: N/A;  9:00 am  . COLONOSCOPY, ESOPHAGOGASTRODUODENOSCOPY (EGD) AND ESOPHAGEAL DILATION  01/2012   SLF: empiric esophageal dilation, gastritis with benign bx, hemorrhoids  . DILATATION & CURETTAGE/HYSTEROSCOPY WITH MYOSURE N/A 02/10/2015   Procedure: DILATATION & CURETTAGE/HYSTEROSCOPY WITH MYOSURE;  Surgeon: BNunzio Cobbs MD;  [facility];  Service: Gynecology;  Laterality: N/A;  . MASTECTOMY PARTIAL / LUMPECTOMY     rt.  . MM BREAST STEREO BX*L*R/S     rt.  .Marland KitchenPOLYPECTOMY  09/17/2016   Procedure: POLYPECTOMY;  Surgeon: RDaneil Dolin MD;  [facility];  Service: Endoscopy;;  ascending colon  . PORT-A-CATH REMOVAL  05/26/2012   Procedure: REMOVAL PORT-A-CATH;  Surgeon: MJamesetta So MD;  Location: [facility];  Service: General;  Laterality: N/A;  Minor Room  . PORTACATH PLACEMENT      Current Medications: Outpatient Medications Prior to Visit  Medication Sig Dispense Refill  . albuterol (PROVENTIL HFA;VENTOLIN HFA) 108 (90 Base) MCG/ACT inhaler Inhale 2 puffs into the lungs every 6 (six)  hours as needed for wheezing or shortness of breath. 1 Inhaler 2  . atorvastatin (LIPITOR) 40 MG tablet Take 1 tablet (40 mg total) by mouth daily. 90 tablet 3  . blood glucose meter kit and supplies KIT Dispense based on patient and insurance preference. TEST ONCE DAILY. DIABETES TYPE 2 ICD 10 CODE E11.9 1 each 5  . citalopram (CELEXA) 40 MG tablet Take 1 tablet (40 mg total) by mouth daily. 90 tablet 1  . diazepam (VALIUM) 5 MG tablet Take 1 tablet by mouth as needed.  0   . lisinopril (PRINIVIL,ZESTRIL) 10 MG tablet Take 1 tablet (10 mg total) by mouth daily. 30 tablet 5  . metFORMIN (GLUCOPHAGE) 500 MG tablet Take 1 tablet (500 mg total) by mouth 2 (two) times daily. 180 tablet 1  . pantoprazole (PROTONIX) 40 MG tablet TAKE ONE TABLET BY MOUTH ONCE DAILY FOR  ACID  REFLUX 90 tablet 0  . HYDROcodone-homatropine (HYCODAN) 5-1.5 MG/5ML syrup Take 1 teaspoon at bedtime as needed for cough 90 mL 0   No facility-administered medications prior to visit.      Allergies:   Pravastatin   Social History   Social History  . Marital status: Legally Separated    Spouse name: N/A  . Number of children: 3  . Years of education: N/A   Occupational History  . child care [employer]   Social History Main Topics  . Smoking status: Never Smoker  . Smokeless tobacco: Never Used  . Alcohol use No  . Drug use: No  . Sexual activity: Yes    Partners: Male    Birth control/ protection: None   Other Topics Concern  . None   Social History Narrative  . None     Family History:  The patient's family history includes Bipolar disorder in her sister; Cancer in her mother; Colon cancer in her mother; Coronary artery disease in her brother, father, and mother; Diabetes in her father, maternal grandfather, and paternal grandmother; Diabetes type I in her father and mother; Heart disease in her paternal grandfather; Hyperlipidemia in her maternal grandmother; Hypertension in her brother, father, maternal grandfather, and maternal grandmother; Peripheral vascular disease in her mother; Stroke in her maternal grandmother.   ROS:   Please see the history of present illness.    Review of Systems  Constitution: Negative.  HENT: Negative.   Eyes: Negative.   Cardiovascular: Positive for chest pain.  Respiratory: Negative.   Hematologic/Lymphatic: Negative.   Musculoskeletal: Negative.  Negative for joint pain.  Gastrointestinal: Negative.   Genitourinary: Negative.    Neurological: Negative.   Psychiatric/Behavioral: The patient is nervous/anxious.    All other systems reviewed and are negative.   PHYSICAL EXAM:   VS:  BP 124/82   Pulse 82   Ht '5\' 4"'$  (1.626 m)   Wt 251 lb (113.9 kg)   LMP 04/05/2016 Comment: spotting 11/2016 and 12/2016  SpO2 95%   BMI 43.08 kg/m   Physical Exam  GEN: Obese, in no acute distress  Neck: no JVD, carotid bruits, or masses Cardiac:RRR; no murmurs, rubs, or gallops  Respiratory:  clear to auscultation bilaterally, normal work of breathing GI: soft, nontender, nondistended, + BS Ext: without cyanosis, clubbing, or edema, Good distal pulses bilaterally Psych: euthymic mood, full affect  Wt Readings from Last 3 Encounters:  02/27/17 251 lb (113.9 kg)  02/15/17 250 lb 6 oz (113.6 kg)  01/18/17 250 lb 9.6 oz (113.7 kg)      Studies/Labs  Reviewed:   EKG:  EKG is notordered today.    Recent Labs: 12/14/2016: ALT 20; BUN 10; Creatinine, Ser 0.95; Hemoglobin 12.7; Platelets 168; Potassium 3.2; Sodium 137   Lipid Panel    Component Value Date/Time   CHOL 183 12/07/2016 1137   TRIG 271 (H) 12/07/2016 1137   HDL 47 12/07/2016 1137   CHOLHDL 3.9 12/07/2016 1137   CHOLHDL 4.8 06/04/2014 0919   VLDL 36 06/04/2014 0919   LDLCALC 82 12/07/2016 1137    Additional studies/ records that were reviewed today include:  Nuclear stress test 2 day protocol 01/17/17   No diagnostic ST segment changes to indicate ischemia.  Small, mild intensity, mid to apical anteroseptal defect that is partially reversible. Suspect variable breast attenuation artifact rather than a small ischemic territory.  This is a low risk study.  Nuclear stress EF: 55%.     ASSESSMENT:    1. Chest pain, unspecified type   2. Essential hypertension   3. Hypercholesterolemia   4. Type 2 diabetes mellitus without complication, without long-term current use of insulin (HCC)      PLAN:  In order of problems listed above:  Chest pain with  nuclear stress test showing small mild intensity mid to apical anterior septal defect is partially reversible. Suspect variable breast attenuation artifact rather than small ischemic territory. Felt to be a low risk study. LVEF 55%.Atypical chest pain mostly related to stress and anxiety and sharp shooting in nature. No further testing at this time. Follow-up with Dr. Harl Bowie in 2 months.  Essential hypertension controlled with lisinopril  Diabetes mellitus managed by primary care  Hypercholesterolemia on Lipitor   Medication Adjustments/Labs and Tests Ordered: Current medicines are reviewed at length with the patient today.  Concerns regarding medicines are outlined above.  Medication changes, Labs and Tests ordered today are listed in the Patient Instructions below. Patient Instructions  Your physician recommends that you schedule a follow-up appointment in: 4-6 Weeks with Dr. Harl Bowie   Your physician recommends that you continue on your current medications as directed. Please refer to the Current Medication list given to you today.  If you need a refill on your cardiac medications before your next appointment, please call your pharmacy.  Thank you for choosing [facility]!       Sumner Boast, PA-C  02/27/2017 11:37 AM    [facility] [place], [place], [place]  [address] Phone: [phone]; Fax: [phone]

## 2017-02-27 NOTE — Patient Instructions (Signed)
Your physician recommends that you schedule a follow-up appointment in: 4-6 Weeks with Dr. Harl Bowie   Your physician recommends that you continue on your current medications as directed. Please refer to the Current Medication list given to you today.  If you need a refill on your cardiac medications before your next appointment, please call your pharmacy.  Thank you for choosing [facility]!

## 2017-03-07 ENCOUNTER — Other Ambulatory Visit (HOSPITAL_COMMUNITY): Payer: Self-pay | Admitting: Oncology

## 2017-03-07 DIAGNOSIS — Z853 Personal history of malignant neoplasm of breast: Secondary | ICD-10-CM

## 2017-03-12 ENCOUNTER — Encounter (HOSPITAL_COMMUNITY): Payer: BLUE CROSS/BLUE SHIELD

## 2017-03-19 ENCOUNTER — Ambulatory Visit (HOSPITAL_COMMUNITY)
Admission: RE | Admit: 2017-03-19 | Discharge: 2017-03-19 | Disposition: A | Discharge status: Home / Self Care | Payer: BLUE CROSS/BLUE SHIELD | Source: Ambulatory Visit | Attending: Oncology | Admitting: Oncology

## 2017-03-19 DIAGNOSIS — Z9889 Other specified postprocedural states: Secondary | ICD-10-CM | POA: Diagnosis not present

## 2017-03-19 DIAGNOSIS — Z853 Personal history of malignant neoplasm of breast: Secondary | ICD-10-CM | POA: Insufficient documentation

## 2017-03-19 DIAGNOSIS — C50911 Malignant neoplasm of unspecified site of right female breast: Secondary | ICD-10-CM | POA: Diagnosis present

## 2017-03-22 ENCOUNTER — Ambulatory Visit (HOSPITAL_COMMUNITY): Payer: BLUE CROSS/BLUE SHIELD | Admitting: Adult Health

## 2017-03-29 ENCOUNTER — Ambulatory Visit (HOSPITAL_COMMUNITY): Payer: BLUE CROSS/BLUE SHIELD | Admitting: Adult Health

## 2017-04-02 ENCOUNTER — Encounter (HOSPITAL_COMMUNITY): Payer: Self-pay | Admitting: Adult Health

## 2017-04-02 ENCOUNTER — Encounter (HOSPITAL_COMMUNITY): Payer: BLUE CROSS/BLUE SHIELD | Attending: Oncology | Admitting: Adult Health

## 2017-04-02 VITALS — BP 120/71 | HR 84 | Temp 98.5°F | Resp 18 | Wt 252.0 lb

## 2017-04-02 DIAGNOSIS — Z853 Personal history of malignant neoplasm of breast: Secondary | ICD-10-CM | POA: Diagnosis not present

## 2017-04-02 DIAGNOSIS — Z780 Asymptomatic menopausal state: Secondary | ICD-10-CM

## 2017-04-02 DIAGNOSIS — C50911 Malignant neoplasm of unspecified site of right female breast: Secondary | ICD-10-CM

## 2017-04-02 NOTE — Progress Notes (Signed)
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; ER-/PR+(2%)/HER2-  CURRENT THERAPY: Surveillance   BRIEF ONCOLOGIC HISTORY:    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[name])   02/28/2011 Initial Diagnosis    Right needle core biopsy demonstrating Invasive ductal carcinoma of right breast      03/21/2011 Surgery    Right lumpectomy demonstrating a 3.8 cm invasive ductal carcinoma, grade III, no LVI, and 0/1 lymph nodes      05/11/2011 - 07/13/2011 Chemotherapy    AC x 4      08/03/2011 - 10/19/2011 Chemotherapy    Paclitaxel weekly x 12      11/07/2011 - 12/31/2011 Radiation Therapy         02/10/2015 Procedure    Hysteroscopy with dilation and curettage by Dr. Josefa Half.      02/10/2015 Pathology Results    Diagnosis Endometrium, curettage - ABUNDANT BLOOD AND DEGENERATIVE SECRETORY ENDOMETRIUM WITH STROMAL BREAKDOWN (VERY LIMITED MATERIAL). - INFLAMED SQUAMOUS EPITHELIUM AND ENDOCERVICAL MUCOSA, NO DYSPLASIA OR MALIGNANCY.        HISTORY OF PRESENT ILLNESS:  (From Dr. Donald Pore last note on 03/20/16)      INTERVAL HISTORY:  [name] returns for routine follow-up for history of right breast cancer.  She is here today unaccompanied. Overall, she tells me she has been feeling quite well. She feels tired sometimes; her appetite has not been good lately as she has been dealing with dental issues. She had an abscessed tooth extracted yesterday, and both her headaches and chewing problems are much improved today. She also had an episode of nausea after tooth extraction, but reports that she feels better today.  Endorses occasional esophageal spasms when drinking very cold liquids.    Chart reviewed; patient noted to have had 3 ED  visits in the past year. ED visit on 04/19/16 for UTI; she was given IM Rocephin & discharged in stable condition with oral antibiotics. On 07/03/16, she presented to ED with productive cough and shortness of breath; found to have acute bronchitis; received duoneb inhalation and steroids and was discharged home. On 08/20/16, reported to ED with rectal bleeding and abd pain; was hemodynamically stable; concern for internal hemorrhoid.  She was seen as an outpatient in Dr. Roseanne Kaufman office and subsequently underwent colonoscopy on 09/17/16.  She also underwent hemorrhoid banding procedure.    No subsequent episodes of rectal bleeding noted. She was told she has diverticulitis, which is managed by diet changes. Her last menstrual cycle was in 02/2016; no vaginal bleeding since that time.  Pap smear is reportedly up-to-date.    Mammogram is up-to-date. She sees PCP regularly. Reports some peripheral neuropathy to bilateral hands, which has been chronic and is no worse.  Endorses periodic arthralgias to legs "that are just achy." No focal bone pain. No headaches, dizziness, or falls.  She has not noticed any changes in her breasts, but does not that her right breast "feels really hard and I'm not sure if that is okay or not."    She is walking for exercise and is looking forward to starting a more formal walking regimen to help boost self-confidence and mood.     REVIEW OF SYSTEMS:  Review of Systems  Constitutional: Positive for fatigue. Negative for  chills and fever.  HENT:         Recent dental extraction  Eyes: Negative.   Respiratory: Negative.  Negative for cough and shortness of breath.   Cardiovascular: Negative.  Negative for chest pain and leg swelling.  Gastrointestinal: Positive for nausea (improved). Negative for abdominal pain, blood in stool, constipation, diarrhea and vomiting.  Endocrine: Negative.   Genitourinary: Negative.  Negative for dysuria, hematuria and vaginal bleeding.     Musculoskeletal: Positive for arthralgias.  Skin: Negative.  Negative for rash.  Neurological: Positive for headaches (improved since dental extraction) and numbness (chronic peripheral neuropathy).  Hematological: Negative.   Psychiatric/Behavioral: Negative.      PAST MEDICAL/SURGICAL HISTORY:  Past Medical History:  Diagnosis Date  . Anxiety   . Blood transfusion without reported diagnosis 1999   due to heavy menses  . Breast cancer ([place]) 05/22/2011   03/01/11, Stage 2, s/p lumpectomy, chemo/xrt  . DDD (degenerative disc disease), lumbar   . Depression 06/22/2011  . DM (diabetes mellitus) ([place]) 06/22/2011  . Genital warts   . GERD (gastroesophageal reflux disease) 06/22/2011  . Hx of adenomatous colonic polyps 10/2007   73m sigmoid tubular adenoma, FH colon cancer, mother in mid-50s  . Hypercholesterolemia 06/22/2011  . Hypertension 06/22/2011  . 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[facility] 05/22/2011  . Iron deficiency anemia 03/25/2015  . Morbid obesity ([facility]   . Shortness of breath   . 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 Vaginal bleeding 03/08/2014   Past Surgical History:  Procedure Laterality Date  . back surg x2    . CHOLECYSTECTOMY    . COLONOSCOPY  11/03/07   4-mm sessile polyp removed/small internal hemorrhoids/tubular adenoma, random colon bx negative for microscopic colitis  . COLONOSCOPY WITH PROPOFOL N/A 09/17/2016   Procedure: COLONOSCOPY WITH PROPOFOL;  Surgeon: RDaneil Dolin MD;  [facility];  Service: Endoscopy;  Laterality: N/A;  9:00 am  . COLONOSCOPY, ESOPHAGOGASTRODUODENOSCOPY (EGD) AND ESOPHAGEAL DILATION  01/2012   SLF: empiric esophageal dilation, gastritis with benign bx, hemorrhoids  . DILATATION & CURETTAGE/HYSTEROSCOPY WITH MYOSURE N/A 02/10/2015   Procedure: DILATATION & CURETTAGE/HYSTEROSCOPY WITH MYOSURE;  Surgeon: BNunzio Cobbs MD;  [facility];  Service: Gynecology;  Laterality:  N/A;  . MASTECTOMY PARTIAL / LUMPECTOMY     rt.  . MM BREAST STEREO BX*L*R/S     rt.  .Marland KitchenPOLYPECTOMY  09/17/2016   Procedure: POLYPECTOMY;  Surgeon: RDaneil Dolin MD;  [facility];  Service: Endoscopy;;  ascending colon  . PORT-A-CATH REMOVAL  05/26/2012   Procedure: REMOVAL PORT-A-CATH;  Surgeon: MJamesetta So MD;  Location: [facility];  Service: General;  Laterality: N/A;  Minor Room  . PORTACATH PLACEMENT       SOCIAL HISTORY:  Social History   Social History  . Marital status: Legally Separated    Spouse name: N/A  . Number of children: 3  . Years of education: N/A   Occupational History  . child care [employer]   Social History Main Topics  . Smoking status: Never Smoker  . Smokeless tobacco: Never Used  . Alcohol use No  . Drug use: No  . Sexual activity: Yes    Partners: Male    Birth control/ protection: None   Other Topics Concern  . Not on file   Social History Narrative  . No narrative on file    FAMILY HISTORY:  Family History  Problem Relation Age of Onset  . Colon cancer Mother        mid-50s, died of metastatic disease  . Cancer Mother        liver  . Coronary artery disease Mother   . Diabetes type I Mother   . Peripheral vascular disease Mother        Carotid disease in her 72s  . Coronary artery disease Father        CAD in his 12s  . Diabetes Father   . Diabetes type I Father   . Hypertension Father   . Bipolar disorder Sister   . Coronary artery disease Brother        MI in his 22s  . Hypertension Brother   . Hypertension Maternal Grandmother   . Hyperlipidemia Maternal Grandmother   . Stroke Maternal Grandmother   . Hypertension Maternal Grandfather   . Diabetes Maternal Grandfather   . Diabetes Paternal Grandmother   . Heart disease Paternal Grandfather     CURRENT MEDICATIONS:  Outpatient Encounter Prescriptions as of 04/02/2017  Medication Sig  . acetaminophen-codeine (TYLENOL #3) 300-30 MG tablet   .  albuterol (PROVENTIL HFA;VENTOLIN HFA) 108 (90 Base) MCG/ACT inhaler Inhale 2 puffs into the lungs every 6 (six) hours as needed for wheezing or shortness of breath.  Marland Kitchen amoxicillin (AMOXIL) 875 MG tablet   . atorvastatin (LIPITOR) 40 MG tablet Take 1 tablet (40 mg total) by mouth daily.  . blood glucose meter kit and supplies KIT Dispense based on patient and insurance preference. TEST ONCE DAILY. DIABETES TYPE 2 ICD 10 CODE E11.9  . citalopram (CELEXA) 40 MG tablet Take 1 tablet (40 mg total) by mouth daily.  . diazepam (VALIUM) 5 MG tablet Take 1 tablet by mouth as needed.  Marland Kitchen lisinopril (PRINIVIL,ZESTRIL) 10 MG tablet Take 1 tablet (10 mg total) by mouth daily.  . metFORMIN (GLUCOPHAGE) 500 MG tablet Take 1 tablet (500 mg total) by mouth 2 (two) times daily.  . pantoprazole (PROTONIX) 40 MG tablet TAKE ONE TABLET BY MOUTH ONCE DAILY FOR  ACID  REFLUX   No facility-administered encounter medications on file as of 04/02/2017.     ALLERGIES:  Allergies  Allergen Reactions  . Pravastatin Other (See Comments)    Muscle aches     PHYSICAL EXAM:  ECOG Performance status: 0 - Asymptomatic   Vitals:   04/02/17 1318  BP: 120/71  Pulse: 84  Resp: 18  Temp: 98.5 F (36.9 C)   Filed Weights   04/02/17 1318  Weight: 252 lb (114.3 kg)    Physical Exam  Constitutional: She is oriented to person, place, and time and well-developed, well-nourished, and in no distress.  HENT:  Head: Normocephalic.  Mouth/Throat: Oropharynx is clear and moist. No oropharyngeal exudate.  Eyes: Conjunctivae are normal. Pupils are equal, round, and reactive to light. No scleral icterus.  Neck: Normal range of motion. Neck supple.  Cardiovascular: Normal rate, regular rhythm and normal heart sounds.   Pulmonary/Chest: Effort normal and breath sounds normal. No respiratory distress.    Abdominal: Soft. Bowel sounds are normal. There is no tenderness. There is no rebound and no guarding.  Musculoskeletal:  Normal range of motion. She exhibits no edema.  Lymphadenopathy:    She has no cervical adenopathy.       Right: No supraclavicular adenopathy present.       Left: No supraclavicular adenopathy present.  Neurological: She is alert and oriented to person, place, and  time. No cranial nerve deficit. Gait normal.  Skin: Skin is warm and dry. No rash noted.  Psychiatric: Mood, memory, affect and judgment normal.  Nursing note and vitals reviewed.    LABORATORY DATA:  I have reviewed the labs as listed.  CBC    Component Value Date/Time   WBC 4.3 12/14/2016 1326   RBC 4.43 12/14/2016 1326   HGB 12.7 12/14/2016 1326   HGB 10.0 (L) 02/10/2015 0848   HCT 39.1 12/14/2016 1326   PLT 168 12/14/2016 1326   MCV 88.3 12/14/2016 1326   MCH 28.7 12/14/2016 1326   MCHC 32.5 12/14/2016 1326   RDW 16.1 (H) 12/14/2016 1326   LYMPHSABS 2,312 10/02/2016 1240   MONOABS 408 10/02/2016 1240   EOSABS 204 10/02/2016 1240   BASOSABS 0 10/02/2016 1240   CMP Latest Ref Rng & Units 12/14/2016 12/07/2016 10/02/2016  Glucose 65 - 99 mg/dL 103(H) 103(H) 136(H)  BUN 6 - 20 mg/dL '10 12 14  '$ Creatinine 0.44 - 1.00 mg/dL 0.95 0.88 1.15(H)  Sodium 135 - 145 mmol/L 137 140 138  Potassium 3.5 - 5.1 mmol/L 3.2(L) 4.3 4.2  Chloride 101 - 111 mmol/L 103 97 101  CO2 22 - 32 mmol/L '27 27 31  '$ Calcium 8.9 - 10.3 mg/dL 8.6(L) 9.4 9.3  Total Protein 6.5 - 8.1 g/dL 7.3 7.4 -  Total Bilirubin 0.3 - 1.2 mg/dL 0.7 0.4 -  Alkaline Phos 38 - 126 U/L 42 55 -  AST 15 - 41 U/L 28 16 -  ALT 14 - 54 U/L 20 18 -    PENDING LABS:    DIAGNOSTIC IMAGING:  *The following radiologic images and reports have been reviewed independently and agree with below findings.  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 She went on to complete adjuvant breast radiation as well in 12/2011. Tamoxifen was recommended given PR+, but patient declined. She has remained on surveillance alone since completing treatment.  -Most recent diagnostic mammogram 03/19/17 reviewed and negative. Discussed with patient that she will get her final diagnostic mammogram in 03/2018; after that time, she will proceed to annual screening mammogram. We briefly discussed the difference between the 2 mammogram types; these recommendations are based on diagnostic mammograms x 7 years following lumpectomy.  -Clinical breast exam performed today and is benign; there are expected post-radiation fibrosis/changes noted to right breast. Recent negative mammogram is reassuring as well.   -Although, she did have 2% PR positivity on original path, I suspect her cancer behaved more like triple negative disease in terms of aggressive biology with Ki67 90%.  -We discussed her continued follow-up schedule here at the cancer center.  It has been ~6 years since her original cancer diagnosis and she is without evidence of disease, which is favorable.  We discussed that since triple negative breast cancers can often recur late (>5 years from original diagnosis), that I would propose we follow her with annual visits/exams through 10 years (02/2021).  Of course, if she changes her mind, then it certainly would not be unreasonable for her to be discharged from the cancer center at any time she wishes. She wishes to continue to be seen annually and we are happy to accommodate.  -Annual mammogram  due in 03/2018; orders placed today.  -Return to cancer center in 1 year after she undergoes mammogram.  No labs are necessary as she sees her PCP regularly.    Bone health:  -Since she is now likely post-menopausal, I recommended she take calcium and vitamin D supplementation. She also tells me that she does not eat much dairy or foods  high in calcium.  -Recommended she take 1200 mg calcium per day with 1500-2000 IU vitamin D3 per day.  -Increase weight bearing exercises as well; stressed the importance of bone health in post-menopausal women.    Health maintenance/Wellness promotion:  -Commended her efforts in diet and exercise. Encouraged her to keep up the good work. There is great data to suggest that women who exercise regularly have decreased risk of breast cancer recurrence.  -Maintain follow-up with PCP for general health/wellness, along with age-appropriate cancer screenings.  She is up-to-date with pap smear and colonoscopy.      Dispo:  -Mammogram due in 03/2018; orders placed today.  -Return to cancer center in 1 year following next mammogram.   All questions were answered to patient's stated satisfaction. Encouraged patient to call with any new concerns or questions before her next visit to the cancer center and we can certain see her sooner, if needed.    A total of 30 minutes was spent in face-to-face care of this patient, with greater than 50% of that time spent in counseling and care-coordination.    Plan of care discussed with Dr. Talbert Cage, who agrees with the above aforementioned.    Orders placed this encounter:  Orders Placed This Encounter  Procedures  . MM DIAG BREAST TOMO BILATERAL      Mike Craze, NP [place] [phone]

## 2017-04-02 NOTE — Patient Instructions (Addendum)
[facility] at [facility] Discharge Instructions  RECOMMENDATIONS MADE BY THE CONSULTANT AND ANY TEST RESULTS WILL BE SENT TO YOUR REFERRING PHYSICIAN.  You were seen today by Mike Craze NP. Start taking 1200mg  of Calcium daily and 1500-2000IU of vitamin D3 daily. Return in 1 year for follow up.   Thank you for choosing [place] at [facility] to provide your oncology and hematology care.  To afford each patient quality time with our provider, please arrive at least 15 minutes before your scheduled appointment time.    If you have a lab appointment with the [facility] please come in thru the  Main Entrance and check in at the main information desk  You need to re-schedule your appointment should you arrive 10 or more minutes late.  We strive to give you quality time with our providers, and arriving late affects you and other patients whose appointments are after yours.  Also, if you no show three or more times for appointments you may be dismissed from the clinic at the providers discretion.     Again, thank you for choosing [facility].  Our hope is that these requests will decrease the amount of time that you wait before being seen by our physicians.       _____________________________________________________________  Should you have questions after your visit to [facility], please contact our office at [phone] between the hours of 8:30 a.m. and 4:30 p.m.  Voicemails left after 4:30 p.m. will not be returned until the following business day.  For prescription refill requests, have your pharmacy contact our office.       Resources For Cancer Patients and their Caregivers ? American Cancer Society: Can assist with transportation, wigs, general needs, runs Look Good Feel Better.        (802)195-1753 ? Cancer Care: Provides financial assistance, online support groups, medication/co-pay assistance.   1-800-813-HOPE 272 836 3964) ? [place] Assists [place] cancer patients and their families through emotional , educational and financial support.  4780366808 ? [place] DSS Where to apply for food stamps, Medicaid and utility assistance. 614 664 9457 ? [facility]: Transportation to medical appointments. 904-307-2008 ? Social Security Administration: May apply for disability if have a Stage IV cancer. 724-617-7797 915-470-8390 ? [employer], Disability and Transit Services: Assists with nutrition, care and transit needs. [place] Support Programs: @10RELATIVEDAYS @ > Cancer Support Group  2nd Tuesday of the month 1pm-2pm, Journey Room  > Creative Journey  3rd Tuesday of the month 1130am-1pm, Journey Room  > Look Good Feel Better  1st Wednesday of the month 10am-12 noon, Journey Room (Call [place] to register [phone])

## 2017-04-10 ENCOUNTER — Ambulatory Visit (INDEPENDENT_AMBULATORY_CARE_PROVIDER_SITE_OTHER): Payer: BLUE CROSS/BLUE SHIELD | Admitting: Family Medicine

## 2017-04-10 ENCOUNTER — Encounter: Payer: Self-pay | Admitting: Family Medicine

## 2017-04-10 ENCOUNTER — Ambulatory Visit (HOSPITAL_COMMUNITY)
Admission: RE | Admit: 2017-04-10 | Discharge: 2017-04-10 | Disposition: A | Discharge status: Home / Self Care | Payer: BLUE CROSS/BLUE SHIELD | Source: Ambulatory Visit | Attending: Family Medicine | Admitting: Family Medicine

## 2017-04-10 VITALS — BP 128/80 | Temp 98.0°F | Ht 64.0 in | Wt 251.0 lb

## 2017-04-10 DIAGNOSIS — R350 Frequency of micturition: Secondary | ICD-10-CM | POA: Diagnosis not present

## 2017-04-10 DIAGNOSIS — M79671 Pain in right foot: Secondary | ICD-10-CM | POA: Insufficient documentation

## 2017-04-10 LAB — POCT URINALYSIS DIPSTICK
Spec Grav, UA: 1.02 (ref 1.010–1.025)
pH, UA: 5 (ref 5.0–8.0)

## 2017-04-10 MED ORDER — PREDNISONE 20 MG PO TABS: 20.0000 mg | ORAL_TABLET | Freq: Every day | ORAL | 0 refills | Status: DC

## 2017-04-10 MED ORDER — AMOXICILLIN-POT CLAVULANATE 875-125 MG PO TABS: 1.0000 | ORAL_TABLET | Freq: Two times a day (BID) | ORAL | 0 refills | Status: DC

## 2017-04-10 NOTE — Progress Notes (Signed)
   Subjective:    Patient ID: [name], female    DOB: 1966/02/26, 51 y.o.   MRN: [id number]  Foot Pain  This is a new problem. Episode onset: one week. Associated symptoms comments: Pain on right side of foot. She has tried nothing for the symptoms.   progressive foot pain  Worse now  sewelloig'      Recalls no foot injury. Saw him pain particularly lateral foot. Recalls no overuse               Frequent urination and urgency to go. Started one 4 days ago. freq  No fever no chills no nausea no vomiting   Review of Systems No headache, no major weight loss or weight gain, no chest pain no back pain abdominal pain no change in bowel habits complete ROS otherwise negative     Objective:   Physical Exam Alert vitals stable, NAD. Blood pressure good on repeat. HEENT normal. Lungs clear. Heart regular rate and rhythm. Right lateral foot distinctly tender to palpation positive edema       Assessment & Plan:  Impression metatarsalgia/tendinitis with inflammation. question stress fracture though doubt. Discussed. Mild UTI noted on urinalysis plan antibiotics. Steroid taper. X-ray involved area

## 2017-05-31 ENCOUNTER — Encounter (HOSPITAL_COMMUNITY): Payer: Self-pay | Admitting: Emergency Medicine

## 2017-05-31 ENCOUNTER — Other Ambulatory Visit: Payer: Self-pay | Admitting: Family Medicine

## 2017-05-31 ENCOUNTER — Emergency Department (HOSPITAL_COMMUNITY)
Admission: EM | Admit: 2017-05-31 | Discharge: 2017-05-31 | Disposition: A | Discharge status: Home / Self Care | Payer: BLUE CROSS/BLUE SHIELD | Attending: Emergency Medicine | Admitting: Emergency Medicine

## 2017-05-31 DIAGNOSIS — Y92410 Unspecified street and highway as the place of occurrence of the external cause: Secondary | ICD-10-CM | POA: Insufficient documentation

## 2017-05-31 DIAGNOSIS — Z79899 Other long term (current) drug therapy: Secondary | ICD-10-CM | POA: Diagnosis not present

## 2017-05-31 DIAGNOSIS — Z7984 Long term (current) use of oral hypoglycemic drugs: Secondary | ICD-10-CM | POA: Insufficient documentation

## 2017-05-31 DIAGNOSIS — I10 Essential (primary) hypertension: Secondary | ICD-10-CM | POA: Insufficient documentation

## 2017-05-31 DIAGNOSIS — E119 Type 2 diabetes mellitus without complications: Secondary | ICD-10-CM | POA: Insufficient documentation

## 2017-05-31 DIAGNOSIS — S161XXA Strain of muscle, fascia and tendon at neck level, initial encounter: Secondary | ICD-10-CM | POA: Insufficient documentation

## 2017-05-31 DIAGNOSIS — Y9389 Activity, other specified: Secondary | ICD-10-CM | POA: Insufficient documentation

## 2017-05-31 DIAGNOSIS — M545 Low back pain, unspecified: Secondary | ICD-10-CM

## 2017-05-31 DIAGNOSIS — S199XXA Unspecified injury of neck, initial encounter: Secondary | ICD-10-CM | POA: Diagnosis present

## 2017-05-31 DIAGNOSIS — Y999 Unspecified external cause status: Secondary | ICD-10-CM | POA: Diagnosis not present

## 2017-05-31 MED ORDER — CYCLOBENZAPRINE HCL 10 MG PO TABS: 10.0000 mg | ORAL_TABLET | Freq: Two times a day (BID) | ORAL | 0 refills | Status: DC | PRN

## 2017-05-31 MED ORDER — IBUPROFEN 800 MG PO TABS: 800.0000 mg | ORAL_TABLET | Freq: Three times a day (TID) | ORAL | 0 refills | Status: DC
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
## 2017-05-31 NOTE — Discharge Instructions (Signed)
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
## 2017-06-07 ENCOUNTER — Ambulatory Visit (INDEPENDENT_AMBULATORY_CARE_PROVIDER_SITE_OTHER): Payer: BLUE CROSS/BLUE SHIELD | Admitting: Family Medicine

## 2017-06-07 ENCOUNTER — Encounter: Payer: Self-pay | Admitting: Family Medicine

## 2017-06-07 VITALS — BP 114/80 | Ht 64.0 in | Wt 255.0 lb

## 2017-06-07 DIAGNOSIS — E119 Type 2 diabetes mellitus without complications: Secondary | ICD-10-CM | POA: Diagnosis not present

## 2017-06-07 DIAGNOSIS — M79671 Pain in right foot: Secondary | ICD-10-CM

## 2017-06-07 DIAGNOSIS — F324 Major depressive disorder, single episode, in partial remission: Secondary | ICD-10-CM | POA: Diagnosis not present

## 2017-06-07 DIAGNOSIS — I10 Essential (primary) hypertension: Secondary | ICD-10-CM | POA: Diagnosis not present

## 2017-06-07 LAB — POCT GLYCOSYLATED HEMOGLOBIN (HGB A1C): Hemoglobin A1C: 5.3

## 2017-06-07 MED ORDER — LISINOPRIL 10 MG PO TABS: ORAL_TABLET | ORAL | 1 refills | Status: DC

## 2017-06-07 MED ORDER — PANTOPRAZOLE SODIUM 40 MG PO TBEC: DELAYED_RELEASE_TABLET | ORAL | 1 refills | Status: DC

## 2017-06-07 MED ORDER — METFORMIN HCL 500 MG PO TABS: 500.0000 mg | ORAL_TABLET | Freq: Two times a day (BID) | ORAL | 1 refills | Status: DC

## 2017-06-07 MED ORDER — CITALOPRAM HYDROBROMIDE 40 MG PO TABS: 40.0000 mg | ORAL_TABLET | Freq: Every day | ORAL | 1 refills | Status: DC

## 2017-06-07 NOTE — Progress Notes (Signed)
   Subjective:    Patient ID: [name], female    DOB: July 23, 1966, 51 y.o.   MRN: [id number]  Diabetes  She presents for her follow-up diabetic visit. She has type 2 diabetes mellitus. She does not see a podiatrist.Eye exam is not current.   Patient claims compliance with diabetes medication. No obvious side effects. Reports no substantial low sugar spells. Most numbers are generally in good range when checked fasting. Generally does not miss a dose of medication. Watching diabetic diet closely  Blood pressure medicine and blood pressure levels reviewed today with patient. Compliant with blood pressure medicine. States does not miss a dose. No obvious side effects. Blood pressure generally good when checked elsewhere. Watching salt intake.   Patient continues to take lipid medication regularly. No obvious side effects from it. Generally does not miss a dose. Prior blood work results are reviewed with patient. Patient continues to work on fat intake in diet   Patient's has concerns of right foot pain., hurts all the time, aching, day and eve  Results for orders placed or performed in visit on 06/07/17  POCT HgB A1C  Result Value Ref Range   Hemoglobin A1C 5.3     Review of Systems No headache, no major weight loss or weight gain, no chest pain no back pain abdominal pain no change in bowel habits complete ROS otherwise negative     Objective:   Physical Exam Alert and oriented, vitals reviewed and stable, NAD ENT-TM's and ext canals WNL bilat via otoscopic exam Soft palate, tonsils and post pharynx WNL via oropharyngeal exam Neck-symmetric, no masses; thyroid nonpalpable and nontender Pulmonary-no tachypnea or accessory muscle use; Clear without wheezes via auscultation Card--no abnrml murmurs, rhythm reg and rate WNL Carotid pulses symmetric, without bruits        Assessment & Plan:  Impression 1 type 2 diabetes discussed. Good control. #2 hypertension good  control discussed compliance discussed #3 hyperlipidemia importance of compliance reviewed. Old work reviewed. #4 depression clinically stable. Results discussed maintain same diet exercise discussed follow-up as scheduled also persistent right foot pain will schedule podiatry referral

## 2017-06-14 ENCOUNTER — Encounter: Payer: Self-pay | Admitting: Family Medicine

## 2017-07-04 ENCOUNTER — Encounter: Payer: Self-pay | Admitting: Internal Medicine

## 2017-08-02 ENCOUNTER — Ambulatory Visit (INDEPENDENT_AMBULATORY_CARE_PROVIDER_SITE_OTHER): Payer: BLUE CROSS/BLUE SHIELD | Admitting: Nurse Practitioner

## 2017-08-02 ENCOUNTER — Encounter: Payer: Self-pay | Admitting: Nurse Practitioner

## 2017-08-02 ENCOUNTER — Ambulatory Visit (HOSPITAL_COMMUNITY)
Admission: RE | Admit: 2017-08-02 | Discharge: 2017-08-02 | Disposition: A | Discharge status: Home / Self Care | Payer: BLUE CROSS/BLUE SHIELD | Source: Ambulatory Visit | Attending: Nurse Practitioner | Admitting: Nurse Practitioner

## 2017-08-02 ENCOUNTER — Other Ambulatory Visit: Payer: Self-pay | Admitting: Nurse Practitioner

## 2017-08-02 VITALS — BP 132/82 | Ht 64.0 in | Wt 254.6 lb

## 2017-08-02 DIAGNOSIS — S6010XA Contusion of unspecified finger with damage to nail, initial encounter: Secondary | ICD-10-CM

## 2017-08-02 DIAGNOSIS — M19041 Primary osteoarthritis, right hand: Secondary | ICD-10-CM | POA: Insufficient documentation

## 2017-08-02 DIAGNOSIS — S60011A Contusion of right thumb without damage to nail, initial encounter: Secondary | ICD-10-CM | POA: Insufficient documentation

## 2017-08-02 DIAGNOSIS — S60111A Contusion of right thumb with damage to nail, initial encounter: Secondary | ICD-10-CM

## 2017-08-02 MED ORDER — HYDROCODONE-ACETAMINOPHEN 5-325 MG PO TABS: 1.0000 | ORAL_TABLET | ORAL | 0 refills | Status: DC | PRN

## 2017-08-03 ENCOUNTER — Encounter: Payer: Self-pay | Admitting: Nurse Practitioner
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
## 2017-08-23 ENCOUNTER — Encounter (HOSPITAL_COMMUNITY): Payer: Self-pay | Admitting: Emergency Medicine

## 2017-08-23 ENCOUNTER — Emergency Department (HOSPITAL_COMMUNITY)
Admission: EM | Admit: 2017-08-23 | Discharge: 2017-08-23 | Disposition: A | Discharge status: Home / Self Care | Payer: BLUE CROSS/BLUE SHIELD | Attending: Emergency Medicine | Admitting: Emergency Medicine

## 2017-08-23 DIAGNOSIS — I10 Essential (primary) hypertension: Secondary | ICD-10-CM | POA: Insufficient documentation

## 2017-08-23 DIAGNOSIS — E119 Type 2 diabetes mellitus without complications: Secondary | ICD-10-CM | POA: Insufficient documentation

## 2017-08-23 DIAGNOSIS — R1033 Periumbilical pain: Secondary | ICD-10-CM | POA: Diagnosis present

## 2017-08-23 DIAGNOSIS — A084 Viral intestinal infection, unspecified: Secondary | ICD-10-CM

## 2017-08-23 DIAGNOSIS — Z7984 Long term (current) use of oral hypoglycemic drugs: Secondary | ICD-10-CM | POA: Diagnosis not present

## 2017-08-23 DIAGNOSIS — Z79899 Other long term (current) drug therapy: Secondary | ICD-10-CM | POA: Insufficient documentation

## 2017-08-23 HISTORY — DX: Diverticulitis of intestine, part unspecified, without perforation or abscess without bleeding: K57.92

## 2017-08-23 LAB — COMPREHENSIVE METABOLIC PANEL
ALT: 22 U/L (ref 14–54)
AST: 24 U/L (ref 15–41)
Albumin: 4 g/dL (ref 3.5–5.0)
Alkaline Phosphatase: 54 U/L (ref 38–126)
Anion gap: 10 (ref 5–15)
BUN: 9 mg/dL (ref 6–20)
CALCIUM: 9.1 mg/dL (ref 8.9–10.3)
CO2: 27 mmol/L (ref 22–32)
Chloride: 100 mmol/L — ABNORMAL LOW (ref 101–111)
Creatinine, Ser: 0.87 mg/dL (ref 0.44–1.00)
GFR calc Af Amer: >60 mL/min (ref >60)
GFR calc non Af Amer: >60 mL/min (ref >60)
GLUCOSE: 112 mg/dL — AB (ref 65–99)
Potassium: 3.7 mmol/L (ref 3.5–5.1)
Sodium: 137 mmol/L (ref 135–145)
Total Bilirubin: 0.9 mg/dL (ref 0.3–1.2)
Total Protein: 7.8 g/dL (ref 6.5–8.1)

## 2017-08-23 LAB — CBC
HCT: 42 % (ref 36.0–46.0)
Hemoglobin: 13.3 g/dL (ref 12.0–15.0)
MCH: 27.7 pg (ref 26.0–34.0)
MCHC: 31.7 g/dL (ref 30.0–36.0)
MCV: 87.3 fL (ref 78.0–100.0)
PLATELETS: 211 10*3/uL (ref 150–400)
RBC: 4.81 MIL/uL (ref 3.87–5.11)
RDW: 15.4 % (ref 11.5–15.5)
WBC: 5.8 10*3/uL (ref 4.0–10.5)

## 2017-08-23 LAB — URINALYSIS, ROUTINE W REFLEX MICROSCOPIC
Bilirubin Urine: NEGATIVE
GLUCOSE, UA: NEGATIVE mg/dL
HGB URINE DIPSTICK: NEGATIVE
KETONES UR: NEGATIVE mg/dL
Leukocytes, UA: NEGATIVE
Nitrite: NEGATIVE
Protein, ur: NEGATIVE mg/dL
Specific Gravity, Urine: 1.015 (ref 1.005–1.030)
pH: 6 (ref 5.0–8.0)

## 2017-08-23 LAB — LIPASE, BLOOD: Lipase: 25 U/L (ref 11–51)

## 2017-08-23 MED ORDER — ONDANSETRON 4 MG PO TBDP: 4.0000 mg | ORAL_TABLET | Freq: Three times a day (TID) | ORAL | 0 refills | Status: DC | PRN

## 2017-08-23 MED ORDER — DIPHENOXYLATE-ATROPINE 2.5-0.025 MG PO TABS: 1.0000 | ORAL_TABLET | Freq: Four times a day (QID) | ORAL | 0 refills | Status: DC | PRN

## 2017-08-23 MED ORDER — SODIUM CHLORIDE 0.9 % IV BOLUS (SEPSIS)
1000.0000 mL | Freq: Once | INTRAVENOUS | Status: AC
  Administered 2017-08-23: 1000 mL via INTRAVENOUS

## 2017-08-23 MED ORDER — PROMETHAZINE HCL 25 MG/ML IJ SOLN
25.0000 mg | Freq: Once | INTRAMUSCULAR | Status: AC
  Administered 2017-08-23: 25 mg via INTRAVENOUS
  Filled 2017-08-23: qty 1

## 2017-08-23 MED ORDER — DIPHENOXYLATE-ATROPINE 2.5-0.025 MG PO TABS
2.0000 | ORAL_TABLET | Freq: Once | ORAL | Status: AC
  Administered 2017-08-23: 2 via ORAL
  Filled 2017-08-23: qty 2

## 2017-08-23 MED ORDER — ONDANSETRON HCL 4 MG/2ML IJ SOLN
4.0000 mg | Freq: Once | INTRAMUSCULAR | Status: AC
  Administered 2017-08-23: 4 mg via INTRAVENOUS
  Filled 2017-08-23: qty 2

## 2017-08-23 NOTE — ED Triage Notes (Signed)
Patient c/o mid abd pain with nausea, vomiting, diarrhea, generalized weakness, and low-grade fevers. Denies any urinary symptoms. Denies any blood in stool. Vomited in past 24 hours- x6-8, BMs in past 24 hours-x5

## 2017-08-23 NOTE — Discharge Instructions (Signed)
Your labs are stable. Use the medicines prescribed for your nausea, vomiting and diarrhea.

## 2017-08-23 NOTE — ED Provider Notes (Signed)
[facility] DEPT Provider Note   CSN: [id number] Arrival date & time: 08/23/17  0931     History   Chief Complaint Chief Complaint  Patient presents with  . Abdominal Pain    HPI [name] is a 51 y.o. female with a history of GERD, DM, and known LLQ diverticulosis (per colonoscopy screening) presenting with a 24 hour history of vomiting, diarrhea and mid abdominal pain.  She denies hematemesis, and stools have been brown and watery.  She reports mid abdominal cramping pain, denies fevers, chills. She has had no sick exposures and denies any recent abx use, travel or consumption of any questionable foods. She has had no medicines prior to arrival.  HPI  Past Medical History:  Diagnosis Date  . Anxiety   . Blood transfusion without reported diagnosis 1999   due to heavy menses  . Breast cancer ([place]) 05/22/2011   03/01/11, Stage 2, s/p lumpectomy, chemo/xrt  . DDD (degenerative disc disease), lumbar   . Depression 06/22/2011  . Diverticulitis   . DM (diabetes mellitus) ([place]) 06/22/2011  . Genital warts   . GERD (gastroesophageal reflux disease) 06/22/2011  . Hx of adenomatous colonic polyps 10/2007   80m sigmoid tubular adenoma, FH colon cancer, mother in mid-50s  . Hypercholesterolemia 06/22/2011  . Hypertension 06/22/2011  . 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[place] 05/22/2011  . Iron deficiency anemia 03/25/2015  . Morbid [facility] ([facility]   . Shortness of breath   . 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 Vaginal bleeding 03/08/2014    Patient Active Problem List   Diagnosis Date Noted  . Chest pain 02/27/2017  . Rectal bleeding 09/06/2016  . Abdominal cramping 09/06/2016  . Fatty liver 09/06/2016  . Dehydration 12/15/2015  . Orthostatic dizziness 12/15/2015  . Nausea vomiting and diarrhea 12/15/2015  . Iron deficiency anemia 03/25/2015  . Excessive or frequent menstruation 04/08/2014  . Dysmenorrhea 04/08/2014  . H/O  adenomatous polyp of colon 01/24/2012  . FH: colon cancer 01/24/2012  . Esophageal dysphagia 01/24/2012  . Chronic diarrhea 01/24/2012  . Depression 06/22/2011  . GERD (gastroesophageal reflux disease) 06/22/2011  . DM (diabetes mellitus) ([place] 06/22/2011  . Hypercholesterolemia 06/22/2011  . Hypertension 06/22/2011  . Obesity 06/22/2011  . 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 back surg x2    . CHOLECYSTECTOMY    . COLONOSCOPY  11/03/07   4-mm sessile polyp removed/small internal hemorrhoids/tubular adenoma, random colon bx negative for microscopic colitis  . COLONOSCOPY WITH PROPOFOL N/A 09/17/2016   Procedure: COLONOSCOPY WITH PROPOFOL;  Surgeon: RDaneil Dolin MD;  [facility];  Service: Endoscopy;  Laterality: N/A;  9:00 am  . COLONOSCOPY, ESOPHAGOGASTRODUODENOSCOPY (EGD) AND ESOPHAGEAL DILATION  01/2012   SLF: empiric esophageal dilation, gastritis with benign bx, hemorrhoids  . DILATATION & CURETTAGE/HYSTEROSCOPY WITH MYOSURE N/A 02/10/2015   Procedure: DILATATION & CURETTAGE/HYSTEROSCOPY WITH MYOSURE;  Surgeon: BNunzio Cobbs MD;  [facility];  Service: Gynecology;  Laterality: N/A;  . MASTECTOMY PARTIAL / LUMPECTOMY     rt.  . MM BREAST STEREO BX*L*R/S     rt.  .Marland KitchenPOLYPECTOMY  09/17/2016   Procedure: POLYPECTOMY;  Surgeon: RDaneil Dolin MD;  [facility];  Service: Endoscopy;;  ascending colon  . PORT-A-CATH REMOVAL  05/26/2012   Procedure: REMOVAL PORT-A-CATH;  Surgeon: MJamesetta So MD;  Location: [facility]S;  Service: General;  Laterality: N/A;  Minor Room  . PORTACATH PLACEMENT      OB History    Gravida Para Term Preterm AB Living   4 3 0 0 1 3   SAB TAB Ectopic Multiple Live Births   0 0 0 0 3       Home Medications    Prior to Admission medications   Medication Sig Start Date End Date Taking? Authorizing Provider  atorvastatin (LIPITOR) 40  MG tablet Take 1 tablet (40 mg total) by mouth daily. 01/11/17 08/23/17 Yes Branch, Alphonse Guild, MD  blood glucose meter kit and supplies KIT Dispense based on patient and insurance preference. TEST ONCE DAILY. DIABETES TYPE 2 ICD 10 CODE E11.9 12/07/16  Yes Mikey Kirschner, MD  citalopram (CELEXA) 40 MG tablet Take 1 tablet (40 mg total) by mouth daily. 06/07/17  Yes Mikey Kirschner, MD  cyclobenzaprine (FLEXERIL) 10 MG tablet Take 1 tablet (10 mg total) by mouth 2 (two) times daily as needed for muscle spasms. 05/31/17  Yes Noemi Chapel, MD  diazepam (VALIUM) 5 MG tablet Take 1 tablet by mouth as needed. 01/02/17  Yes [provider]  lisinopril (PRINIVIL,ZESTRIL) 10 MG tablet TAKE ONE TABLET BY MOUTH ONCE DAILY   **DISCONTINUE LISINOPRIL/HCTZ** 06/07/17  Yes Mikey Kirschner, MD  metFORMIN (GLUCOPHAGE) 500 MG tablet Take 1 tablet (500 mg total) by mouth 2 (two) times daily. 06/07/17  Yes Mikey Kirschner, MD  pantoprazole (PROTONIX) 40 MG tablet TAKE 1 TABLET BY MOUTH ONCE DAILY FOR  ACID  REFLUX 06/07/17  Yes Mikey Kirschner, MD  diphenoxylate-atropine (LOMOTIL) 2.5-0.025 MG tablet Take 1 tablet by mouth 4 (four) times daily as needed for diarrhea or loose stools. 08/23/17   Evalee Jefferson, PA-C  ibuprofen (ADVIL,MOTRIN) 800 MG tablet Take 1 tablet (800 mg total) by mouth 3 (three) times daily. Patient not taking: Reported on 08/23/2017 05/31/17   Noemi Chapel, MD  ondansetron (ZOFRAN ODT) 4 MG disintegrating tablet Take 1 tablet (4 mg total) by mouth every 8 (eight) hours as needed for nausea or vomiting. 08/23/17   Evalee Jefferson, PA-C    Family History Family History  Problem Relation Age of Onset  . Colon cancer Mother        mid-50s, died of metastatic disease  . Cancer Mother        liver  . Coronary artery disease Mother   . Diabetes type I Mother   . Peripheral vascular disease Mother        Carotid disease in her 97s  . Coronary artery disease Father        CAD in his 40s  .  Diabetes Father   . Diabetes type I Father   . Hypertension Father   . Bipolar disorder Sister   . Coronary artery disease Brother        MI in his 33s  . Hypertension Brother   . Hypertension Maternal Grandmother   . Hyperlipidemia Maternal Grandmother   . Stroke Maternal Grandmother   . Hypertension Maternal Grandfather   . Diabetes Maternal Grandfather   . Diabetes Paternal Grandmother   . Heart disease Paternal Grandfather     Social History Social History  Substance Use Topics  . Smoking status: Never Smoker  . Smokeless tobacco: Never Used  . Alcohol use No     Allergies   Pravastatin   Review of Systems Review of Systems  Constitutional: Negative for fever.  HENT: Negative for congestion and sore throat.   Eyes: Negative.   Respiratory: Negative for chest tightness and shortness of breath.   Cardiovascular: Negative for chest pain.  Gastrointestinal: Positive for abdominal pain, diarrhea, nausea and vomiting.  Genitourinary: Negative.   Musculoskeletal: Negative for arthralgias, joint swelling and neck pain.  Skin: Negative.  Negative for rash and wound.  Neurological: Negative for dizziness, weakness, light-headedness, numbness and headaches.  Psychiatric/Behavioral: Negative.      Physical Exam Updated Vital Signs BP (!) 141/96   Pulse 82   Temp 99 F (37.2 C) (Oral)   Resp 18   Ht '5\' 4"'$  (1.626 m)   Wt 113.4 kg (250 lb)   LMP 04/05/2016 Comment: spotting 11/2016 and 12/2016  SpO2 94%   BMI 42.91 kg/m   Physical Exam  Constitutional: She appears well-developed and well-nourished.  HENT:  Head: Normocephalic and atraumatic.  Eyes: Conjunctivae are normal.  Neck: Normal range of motion.  Cardiovascular: Normal rate, regular rhythm, normal heart sounds and intact distal pulses.   Pulmonary/Chest: Effort normal and breath sounds normal. She has no wheezes.  Abdominal: Soft. Bowel sounds are normal. There is tenderness in the periumbilical area.  There is no rigidity and no guarding.  Musculoskeletal: Normal range of motion.  Neurological: She is alert.  Skin: Skin is warm and dry.  Psychiatric: She has a normal mood and affect.  Nursing note and vitals reviewed.    ED Treatments / Results  Labs (all labs ordered are listed, but only abnormal results are displayed) Results for orders placed or performed during the hospital encounter of 08/23/17  Lipase, blood  Result Value Ref Range   Lipase 25 11 - 51 U/L  Comprehensive metabolic panel  Result Value Ref Range   Sodium 137 135 - 145 mmol/L   Potassium 3.7 3.5 - 5.1 mmol/L   Chloride 100 (L) 101 - 111 mmol/L   CO2 27 22 - 32 mmol/L   Glucose, Bld 112 (H) 65 - 99 mg/dL   BUN 9 6 - 20 mg/dL   Creatinine, Ser 0.87 0.44 - 1.00 mg/dL   Calcium 9.1 8.9 - 10.3 mg/dL   Total Protein 7.8 6.5 - 8.1 g/dL   Albumin 4.0 3.5 - 5.0 g/dL   AST 24 15 - 41 U/L   ALT 22 14 - 54 U/L   Alkaline Phosphatase 54 38 - 126 U/L   Total Bilirubin 0.9 0.3 - 1.2 mg/dL   GFR calc non Af Amer >60 >60 mL/min   GFR calc Af Amer >60 >60 mL/min   Anion gap 10 5 - 15  CBC  Result Value Ref Range   WBC 5.8 4.0 - 10.5 K/uL   RBC 4.81 3.87 - 5.11 MIL/uL   Hemoglobin 13.3 12.0 - 15.0 g/dL   HCT 42.0 36.0 - 46.0 %   MCV 87.3 78.0 - 100.0 fL   MCH 27.7 26.0 - 34.0 pg   MCHC 31.7 30.0 - 36.0 g/dL   RDW 15.4 11.5 - 15.5 %   Platelets 211 150 - 400 K/uL  Urinalysis, Routine w reflex microscopic  Result Value Ref Range   Color, Urine YELLOW YELLOW   APPearance HAZY (A) CLEAR   Specific Gravity, Urine 1.015 1.005 - 1.030   pH 6.0 5.0 - 8.0   Glucose, UA NEGATIVE NEGATIVE mg/dL   Hgb urine dipstick NEGATIVE NEGATIVE   Bilirubin Urine NEGATIVE NEGATIVE   Ketones, ur NEGATIVE NEGATIVE mg/dL   Protein, ur NEGATIVE NEGATIVE mg/dL  Nitrite NEGATIVE NEGATIVE   Leukocytes, UA NEGATIVE NEGATIVE      EKG  EKG Interpretation None       Radiology No results found.  Procedures Procedures  (including critical care time)  Medications Ordered in ED Medications  promethazine (PHENERGAN) injection 25 mg (not administered)  sodium chloride 0.9 % bolus 1,000 mL (1,000 mLs Intravenous New Bag/Given 08/23/17 1133)  ondansetron (ZOFRAN) injection 4 mg (4 mg Intravenous Given 08/23/17 1133)  diphenoxylate-atropine (LOMOTIL) 2.5-0.025 MG per tablet 2 tablet (2 tablets Oral Given 08/23/17 1157)     Initial Impression / Assessment and Plan / ED Course  I have reviewed the triage vital signs and the nursing notes.  Pertinent labs & imaging results that were available during my care of the patient were reviewed by me and considered in my medical decision making (see chart for details).     Labs reviewed and normal. Pt given IV fluids, zofran, lomotil. Still with nausea, added dose of phenergan.  At recheck, still benign abdomen. Family member at bedside offered that other family members have had similar sx.  Suspect viral gastroenteritis. Lomotil, zofran, b.r.a.t. Diet. Recheck in 24 hours if persistent or worsened.   Final Clinical Impressions(s) / ED Diagnoses   Final diagnoses:  Viral gastroenteritis    New Prescriptions New Prescriptions   DIPHENOXYLATE-ATROPINE (LOMOTIL) 2.5-0.025 MG TABLET    Take 1 tablet by mouth 4 (four) times daily as needed for diarrhea or loose stools.   ONDANSETRON (ZOFRAN ODT) 4 MG DISINTEGRATING TABLET    Take 1 tablet (4 mg total) by mouth every 8 (eight) hours as needed for nausea or vomiting.     Evalee Jefferson, PA-C 08/23/17 1345    Margette Fast, MD 08/24/17 [phone]

## 2017-10-03 ENCOUNTER — Encounter: Payer: Self-pay | Admitting: Obstetrics and Gynecology

## 2017-10-03 ENCOUNTER — Ambulatory Visit: Payer: BLUE CROSS/BLUE SHIELD | Admitting: Obstetrics and Gynecology

## 2017-10-03 ENCOUNTER — Telehealth: Payer: Self-pay | Admitting: Obstetrics and Gynecology

## 2017-10-03 VITALS — BP 138/84 | HR 84 | Ht 63.5 in | Wt 253.8 lb

## 2017-10-03 DIAGNOSIS — N898 Other specified noninflammatory disorders of vagina: Secondary | ICD-10-CM

## 2017-10-03 DIAGNOSIS — R1032 Left lower quadrant pain: Secondary | ICD-10-CM | POA: Diagnosis not present

## 2017-10-03 DIAGNOSIS — N941 Unspecified dyspareunia: Secondary | ICD-10-CM | POA: Diagnosis not present

## 2017-10-03 LAB — POCT URINALYSIS DIPSTICK
BILIRUBIN UA: NEGATIVE
GLUCOSE UA: NEGATIVE
KETONES UA: NEGATIVE
LEUKOCYTES UA: NEGATIVE
Nitrite, UA: NEGATIVE
PH UA: 5 (ref 5.0–8.0)
Protein, UA: NEGATIVE
RBC UA: NEGATIVE
Urobilinogen, UA: 0.2 E.U./dL

## 2017-10-03 NOTE — Progress Notes (Signed)
GYNECOLOGY  VISIT   HPI: 51 y.o.   Legally Separated  Caucasian  female   [phone] with Patient's last menstrual period was 04/05/2016.   here for LLQ pain and painful intercourse x 2 weeks.  Worried about an ovarian cyst.   Partner present for entire visit.  They acknowledge dryness with intercourse.  No pain medication use for the discomfort.   No fevers.  Some discharge with itching and odor.  No vaginal bleeding.  No more menses.   Voids often.  No dysuria.  No hematuria.   Regular BM daily.   No pain with BMs.   No blood in stool or black tarry stool. Hx hemorrhoids and had them banded in the last year.   Multiple positive ROS -  Breast pain. Excessive thirst. Loss of sexual interest. Urgency and loss or urine w/ sneeze/cough.  Night time urination.  Craving ice. Muscle/joint pain.  Skin itching.   Urine dip  - negative.   GYNECOLOGIC HISTORY: Patient's last menstrual period was 04/05/2016. Contraception:  Postmenopausal Menopausal hormone therapy:  none Last mammogram:  03-19-17 Density B/Neg/lumpectomy changes Rt.Br/BiRads2[facility]:   [facility]18-17 Neg:Neg HR HPV                              09-08-14 ASCUS :Pos HR HPV; colposcopy revealed LGSIL, ECC neg OB History    Gravida Para Term Preterm AB Living   4 3 0 0 1 3   SAB TAB Ectopic Multiple Live Births   0 0 0 0 3         Patient Active Problem List   Diagnosis Date Noted  . Chest pain 02/27/2017  . Rectal bleeding 09/06/2016  . Abdominal cramping 09/06/2016  . Fatty liver 09/06/2016  . Dehydration 12/15/2015  . Orthostatic dizziness 12/15/2015  . Nausea vomiting and diarrhea 12/15/2015  . Iron deficiency anemia 03/25/2015  . Excessive or frequent menstruation 04/08/2014  . Dysmenorrhea 04/08/2014  . H/O adenomatous polyp of colon 01/24/2012  . FH: colon cancer 01/24/2012  . Esophageal dysphagia 01/24/2012  . Chronic diarrhea 01/24/2012  . Depression  06/22/2011  . GERD (gastroesophageal reflux disease) 06/22/2011  . DM (diabetes mellitus) ([place] 06/22/2011  . Hypercholesterolemia 06/22/2011  . Hypertension 06/22/2011  . Obesity 06/22/2011  . 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[place] 05/22/2011    Past Medical History:  Diagnosis Date  . Anxiety   . Blood transfusion without reported diagnosis 1999   due to heavy menses  . [place] 05/22/[place]   03/01/11, Stage 2, s/p lumpectomy, chemo/xrt  . DDD (degenerative disc disease), lumbar   . Depression 06/22/2011  . Diverticulitis   . DM (diabetes mellitus) ([place] 06/22/2011  . Genital warts   . GERD (gastroesophageal reflux disease) 06/22/2011  . Hx of adenomatous colonic polyps 10/2007   443msigmoid tubular adenoma, FH colon cancer, mother in mid-50s  . Hypercholesterolemia 06/22/2011  . Hypertension 06/22/2011  . 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[facility]/01/2011  . Iron deficiency anemia 03/25/2015  . Morbid obesity ([facility]  . Shortness of breath   . 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 Vaginal bleeding 03/08/2014    Past Surgical History:  Procedure Laterality Date  . back surg x2    . CHOLECYSTECTOMY    . COLONOSCOPY  11/03/07   4-mm sessile polyp removed/small internal hemorrhoids/tubular adenoma,  random colon bx negative for microscopic colitis  . COLONOSCOPY WITH PROPOFOL N/A 09/17/2016   Procedure: COLONOSCOPY WITH PROPOFOL;  Surgeon: Daneil Dolin, MD;  [facility];  Service: Endoscopy;  Laterality: N/A;  9:00 am  . COLONOSCOPY, ESOPHAGOGASTRODUODENOSCOPY (EGD) AND ESOPHAGEAL DILATION  01/2012   SLF: empiric esophageal dilation, gastritis with benign bx, hemorrhoids  . DILATATION & CURETTAGE/HYSTEROSCOPY WITH MYOSURE N/A 02/10/2015   Procedure: DILATATION & CURETTAGE/HYSTEROSCOPY WITH MYOSURE;  Surgeon: Nunzio Cobbs, MD;  Location: [facility];  Service: Gynecology;  Laterality: N/A;  . MASTECTOMY  PARTIAL / LUMPECTOMY     rt.  . MM BREAST STEREO BX*L*R/S     rt.  Marland Kitchen POLYPECTOMY  09/17/2016   Procedure: POLYPECTOMY;  Surgeon: Daneil Dolin, MD;  [facility];  Service: Endoscopy;;  ascending colon  . PORT-A-CATH REMOVAL  05/26/2012   Procedure: REMOVAL PORT-A-CATH;  Surgeon: Jamesetta So, MD;  Location: [facility];  Service: General;  Laterality: N/A;  Minor Room  . PORTACATH PLACEMENT      Current Outpatient Medications  Medication Sig Dispense Refill  . blood glucose meter kit and supplies KIT Dispense based on patient and insurance preference. TEST ONCE DAILY. DIABETES TYPE 2 ICD 10 CODE E11.9 1 each 5  . citalopram (CELEXA) 40 MG tablet Take 1 tablet (40 mg total) by mouth daily. 90 tablet 1  . diazepam (VALIUM) 5 MG tablet Take 1 tablet by mouth as needed.  0  . lisinopril (PRINIVIL,ZESTRIL) 10 MG tablet TAKE ONE TABLET BY MOUTH ONCE DAILY   **DISCONTINUE LISINOPRIL/HCTZ** 90 tablet 1  . metFORMIN (GLUCOPHAGE) 500 MG tablet Take 1 tablet (500 mg total) by mouth 2 (two) times daily. 180 tablet 1  . pantoprazole (PROTONIX) 40 MG tablet TAKE 1 TABLET BY MOUTH ONCE DAILY FOR  ACID  REFLUX 90 tablet 1  . atorvastatin (LIPITOR) 40 MG tablet Take 1 tablet (40 mg total) by mouth daily. 90 tablet 3   No current facility-administered medications for this visit.      ALLERGIES: Pravastatin  Family History  Problem Relation Age of Onset  . Colon cancer Mother        mid-50s, died of metastatic disease  . Cancer Mother        liver  . Coronary artery disease Mother   . Diabetes type I Mother   . Peripheral vascular disease Mother        Carotid disease in her 49s  . Coronary artery disease Father        CAD in his 82s  . Diabetes Father   . Diabetes type I Father   . Hypertension Father   . Bipolar disorder Sister   . Coronary artery disease Brother        MI in his 26s  . Hypertension Brother   . Hypertension Maternal Grandmother   . Hyperlipidemia Maternal  Grandmother   . Stroke Maternal Grandmother   . Hypertension Maternal Grandfather   . Diabetes Maternal Grandfather   . Diabetes Paternal Grandmother   . Heart disease Paternal Grandfather     Social History   Socioeconomic History  . Marital status: Divorced    Spouse name: Not on file  . Number of children: 3  . Years of education: Not on file  . Highest education level: Not on file  Social Needs  . Financial resource strain: Not on file  . Food insecurity - worry: Not on file  . Food insecurity -  inability: Not on file  . Transportation needs - medical: Not on file  . Transportation needs - non-medical: Not on file  Occupational History  . Occupation: child care    Employer: [employer]  Tobacco Use  . Smoking status: Never Smoker  . Smokeless tobacco: Never Used  Substance and Sexual Activity  . Alcohol use: No    Alcohol/week: 0.0 oz  . Drug use: No  . Sexual activity: Yes    Partners: Male    Birth control/protection: None  Other Topics Concern  . Not on file  Social History Narrative  . Not on file    ROS:  Pertinent items are noted in HPI.  PHYSICAL EXAMINATION:    BP 138/84 (BP Location: Right Arm, Patient Position: Sitting, Cuff Size: Large)   Pulse 84   Ht 5' 3.5" (1.613 m)   Wt 253 lb 12.8 oz (115.1 kg)   LMP 04/05/2016 Comment: spotting 11/2016 and 12/2016  BMI 44.25 kg/m     General appearance: alert, cooperative and appears stated age   Abdomen:  obese,soft, non-tender, no masses,  no organomegaly  Pelvic: External genitalia:  no lesions              Urethra:  normal appearing urethra with no masses, tenderness or lesions              Bartholins and Skenes: normal                 Vagina: normal appearing vagina with normal color and discharge, no lesions              Cervix: no lesions.  Inconsistent CMT.  Tender with palpation with Q-Tip so I switched from doing an Affirm of vagina to a NuSwab.                Bimanual Exam:  Uterus:   normal size, contour, position, consistency, mobility, non-tender              Adnexa: no mass, fullness, tenderness              Rectal exam: Yes.  .  Confirms.              Anus:  normal sphincter tone, no lesions  Chaperone was present for exam.  ASSESSMENT  LLQ pain.  Dyspareunia.  Menopausal female.  Vaginal odor.   Hx breast cancer.   PLAN  Nuswab - GC/CT/trich/yeast/BV testing.  Return for pelvic US.  Try coconut oil.    An After Visit Summary was printed and given to the patient.  __15____ minutes face to face time of which over 50% was spent in counseling.

## 2017-10-03 NOTE — Telephone Encounter (Signed)
Patient called back requesting earlier appt, scheduled for today at 1:45pm with Dr. Quincy Simmonds. 11/15 appt cancelled. Patient is agreeable.   Routing to provider for final review. Patient is agreeable to disposition. Will close encounter.

## 2017-10-03 NOTE — Telephone Encounter (Signed)
Spoke with patient. Patient reports left sided pelvic pain that started 2 weeks ago. Reports pain as "stabbing" at times, worse with intercourse. Creamy, white vaginal d/c with odor, vaginal burning and itching. Reports intermittent nausea, denies fever/chills.  Patient states no STD concerns. Postmenopausal, denies any vaginal bleeding.   Recommended OV for today with Dr. Quincy Simmonds, patient declined. Patient request 11/16, scheduled for 1:45pm with Dr. Quincy Simmonds. Advised patient should pain worsen or new symptoms develop, return call to office for earlier appt, seek care at local Urgent care/ER after hours. Advised patient Dr. Quincy Simmonds will review, will return call with any additional recommendations, patient verbalizes understanding and is agreeable.   Dr. Quincy Simmonds -any additional recommendations?

## 2017-10-03 NOTE — Telephone Encounter (Signed)
Patient having left sided pelvic pain near her ovary.  Says it is constant stabbing pain and is worse with intercourse.  She has noticed some discharge.  Says it is so bad it is affecting her sex life to the point where she does not want to have sex.

## 2017-10-04 ENCOUNTER — Ambulatory Visit: Payer: Self-pay | Admitting: Obstetrics and Gynecology

## 2017-10-07 LAB — NUSWAB VAGINITIS PLUS (VG+)
Atopobium vaginae: HIGH Score — AB
CANDIDA ALBICANS, NAA: NEGATIVE
CANDIDA GLABRATA, NAA: NEGATIVE
CHLAMYDIA TRACHOMATIS, NAA: NEGATIVE
MEGASPHAERA 1: HIGH {score} — AB
Neisseria gonorrhoeae, NAA: NEGATIVE
TRICH VAG BY NAA: NEGATIVE

## 2017-10-08 ENCOUNTER — Other Ambulatory Visit: Payer: Self-pay | Admitting: *Deleted

## 2017-10-08 MED ORDER — METRONIDAZOLE 500 MG PO TABS: 500.0000 mg | ORAL_TABLET | Freq: Two times a day (BID) | ORAL | 0 refills | Status: DC

## 2017-10-15 ENCOUNTER — Telehealth: Payer: Self-pay | Admitting: Obstetrics and Gynecology

## 2017-10-15 ENCOUNTER — Encounter (HOSPITAL_COMMUNITY): Payer: Self-pay | Admitting: *Deleted

## 2017-10-15 ENCOUNTER — Emergency Department (HOSPITAL_COMMUNITY)
Admission: EM | Admit: 2017-10-15 | Discharge: 2017-10-16 | Disposition: A | Discharge status: Home / Self Care | Payer: BLUE CROSS/BLUE SHIELD | Attending: Emergency Medicine | Admitting: Emergency Medicine

## 2017-10-15 DIAGNOSIS — T7840XA Allergy, unspecified, initial encounter: Secondary | ICD-10-CM

## 2017-10-15 DIAGNOSIS — I10 Essential (primary) hypertension: Secondary | ICD-10-CM | POA: Insufficient documentation

## 2017-10-15 DIAGNOSIS — Z79899 Other long term (current) drug therapy: Secondary | ICD-10-CM | POA: Diagnosis not present

## 2017-10-15 DIAGNOSIS — Z7984 Long term (current) use of oral hypoglycemic drugs: Secondary | ICD-10-CM | POA: Insufficient documentation

## 2017-10-15 DIAGNOSIS — R21 Rash and other nonspecific skin eruption: Secondary | ICD-10-CM | POA: Insufficient documentation

## 2017-10-15 DIAGNOSIS — Z853 Personal history of malignant neoplasm of breast: Secondary | ICD-10-CM | POA: Insufficient documentation

## 2017-10-15 DIAGNOSIS — E119 Type 2 diabetes mellitus without complications: Secondary | ICD-10-CM | POA: Insufficient documentation

## 2017-10-15 MED ORDER — DIPHENHYDRAMINE HCL 50 MG/ML IJ SOLN
25.0000 mg | Freq: Once | INTRAMUSCULAR | Status: AC
  Administered 2017-10-15: 25 mg via INTRAVENOUS
  Filled 2017-10-15: qty 1

## 2017-10-15 MED ORDER — DIPHENHYDRAMINE HCL 50 MG/ML IJ SOLN
25.0000 mg | Freq: Once | INTRAMUSCULAR | Status: AC
  Administered 2017-10-16: 25 mg via INTRAVENOUS
  Filled 2017-10-15: qty 1

## 2017-10-15 MED ORDER — FAMOTIDINE IN NACL 20-0.9 MG/50ML-% IV SOLN
20.0000 mg | Freq: Once | INTRAVENOUS | Status: AC
  Administered 2017-10-15: 20 mg via INTRAVENOUS
  Filled 2017-10-15: qty 50

## 2017-10-15 MED ORDER — METHYLPREDNISOLONE SODIUM SUCC 125 MG IJ SOLR
125.0000 mg | Freq: Once | INTRAMUSCULAR | Status: AC
  Administered 2017-10-15: 125 mg via INTRAVENOUS
  Filled 2017-10-15: qty 2

## 2017-10-15 NOTE — Telephone Encounter (Signed)
Patient called with concerns she may be having an allergic reaction to generic Flagyl .She reports she is "covered in hives."

## 2017-10-15 NOTE — ED Triage Notes (Signed)
Pt on metronidazole 500 mg since 11/21, pt states that her throat feels "funny" since 1600-1700 (as if closing),  Mild sob. Benadryl 50mg  at 1700.

## 2017-10-15 NOTE — ED Notes (Signed)
Report given to Lori RN

## 2017-10-15 NOTE — Telephone Encounter (Signed)
Spoke with patient, advised as seen below per Dr. Sabra Heck. Patient verbalizes understanding and is agreeable to plan, will call with update on 11/28.   Routing to Dr. Lestine Box.

## 2017-10-15 NOTE — Telephone Encounter (Signed)
Reviewed with Dr. Sabra Heck.  1. Stop flagyl.  2. Update allergies 3. Have patient take benadryl, call office with update on 10/16/17  Allergies updated to reflect flagyl.   Called patient, Left message to call Sharee Pimple at [phone].

## 2017-10-15 NOTE — Telephone Encounter (Signed)
Spoke with patient. Reports starting Flagyl po on 11/21. Itching started after starting medication, has worsened over the last few days with hives appearing today. Itching is all over her body.   Reports she felt like she could not swallow earlier, felt this was r/t being scared, is able to swallow fine now.  Denies SHOB or wheezing.   States she is at work and unable to take benadryl.   Advised patient to stop flagyl. Seek immediate care at local ER should [facility] or wheezing develop. Advised patient Dr. Quincy Simmonds is out of the office, will review with covering provider and return call. Patient verbalizes understanding.   Dr. Sabra Heck -please review and advise?   Cc: Dr. Quincy Simmonds

## 2017-10-15 NOTE — Telephone Encounter (Signed)
I agree with this plan.  Thank you for the update.   Cc- Dr. Sabra Heck

## 2017-10-15 NOTE — ED Notes (Signed)
Pt reports decrease in sx at this time.

## 2017-10-15 NOTE — ED Notes (Signed)
Pt resting with eyes closed, appears to be in no distress. Respirations are even and unlabored.  

## 2017-10-16 ENCOUNTER — Other Ambulatory Visit: Payer: Self-pay

## 2017-10-16 ENCOUNTER — Encounter (HOSPITAL_COMMUNITY): Payer: Self-pay | Admitting: Emergency Medicine

## 2017-10-16 ENCOUNTER — Emergency Department (HOSPITAL_COMMUNITY)
Admission: EM | Admit: 2017-10-16 | Discharge: 2017-10-16 | Disposition: A | Discharge status: Home / Self Care | Payer: BLUE CROSS/BLUE SHIELD | Source: Home / Self Care | Attending: Emergency Medicine | Admitting: Emergency Medicine

## 2017-10-16 DIAGNOSIS — T7840XD Allergy, unspecified, subsequent encounter: Secondary | ICD-10-CM

## 2017-10-16 MED ORDER — LORATADINE 10 MG PO TABS
10.0000 mg | ORAL_TABLET | Freq: Once | ORAL | Status: AC
  Administered 2017-10-16: 10 mg via ORAL
  Filled 2017-10-16: qty 1

## 2017-10-16 MED ORDER — FAMOTIDINE IN NACL 20-0.9 MG/50ML-% IV SOLN
20.0000 mg | Freq: Once | INTRAVENOUS | Status: AC
  Administered 2017-10-16: 20 mg via INTRAVENOUS
  Filled 2017-10-16: qty 50

## 2017-10-16 MED ORDER — DIPHENHYDRAMINE HCL 25 MG PO CAPS
50.0000 mg | ORAL_CAPSULE | Freq: Once | ORAL | Status: AC
  Administered 2017-10-16: 50 mg via ORAL
  Filled 2017-10-16: qty 2

## 2017-10-16 MED ORDER — PREDNISONE 20 MG PO TABS: 20.0000 mg | ORAL_TABLET | Freq: Every day | ORAL | 0 refills | Status: DC

## 2017-10-16 MED ORDER — METHYLPREDNISOLONE SODIUM SUCC 125 MG IJ SOLR
60.0000 mg | Freq: Once | INTRAMUSCULAR | Status: AC
  Administered 2017-10-16: 60 mg via INTRAVENOUS
  Filled 2017-10-16: qty 2

## 2017-10-16 MED ORDER — PREDNISONE 10 MG PO TABS: 20.0000 mg | ORAL_TABLET | Freq: Every day | ORAL | 0 refills | Status: DC

## 2017-10-16 MED ORDER — ONDANSETRON HCL 4 MG/2ML IJ SOLN
4.0000 mg | Freq: Once | INTRAMUSCULAR | Status: AC
  Administered 2017-10-16: 4 mg via INTRAVENOUS
  Filled 2017-10-16: qty 2

## 2017-10-16 MED ORDER — EPINEPHRINE 0.3 MG/0.3ML IJ SOAJ
0.3000 mg | Freq: Once | INTRAMUSCULAR | Status: AC
  Administered 2017-10-16: 0.3 mg via INTRAMUSCULAR
  Filled 2017-10-16: qty 0.3

## 2017-10-16 NOTE — ED Provider Notes (Addendum)
[facility] EMERGENCY DEPARTMENT Provider Note   CSN: [id number] Arrival date & time: 10/15/17  1847     History   Chief Complaint Chief Complaint  Patient presents with  . Allergic Reaction    HPI [name] is a 51 y.o. female.  Patient presents with pruritic rash, tightness in her throat, since approximately 1600 today.  Patient has been taking metronidazole recently for a bacterial vaginosis.  She thinks that may be the culprit for her allergic reaction.  Severity of symptoms is moderate.  No substernal chest pain.  She has taken Benadryl at home which helped a minimal amount.      Past Medical History:  Diagnosis Date  . Anxiety   . Blood transfusion without reported diagnosis 1999   due to heavy menses  . Breast cancer ([place]) 05/22/2011   03/01/11, Stage 2, s/p lumpectomy, chemo/xrt  . DDD (degenerative disc disease), lumbar   . Depression 06/22/2011  . Diverticulitis   . DM (diabetes mellitus) ([place]) 06/22/2011  . Genital warts   . GERD (gastroesophageal reflux disease) 06/22/2011  . Hx of adenomatous colonic polyps 10/2007   39m sigmoid tubular adenoma, FH colon cancer, mother in mid-50s  . Hypercholesterolemia 06/22/2011  . Hypertension 06/22/2011  . 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 Iron deficiency anemia 03/25/2015  . Morbid obesity ([place]   . Shortness of breath   . 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 Vaginal bleeding 03/08/2014    Patient Active Problem List   Diagnosis Date Noted  . Chest pain 02/27/2017  . Rectal bleeding 09/06/2016  . Abdominal cramping 09/06/2016  . Fatty liver 09/06/2016  . Dehydration 12/15/2015  . Orthostatic dizziness 12/15/2015  . Nausea vomiting and diarrhea 12/15/2015  . Iron deficiency anemia 03/25/2015  . Excessive or frequent menstruation 04/08/2014  . Dysmenorrhea 04/08/2014  . H/O adenomatous polyp of colon 01/24/2012  . FH: colon cancer 01/24/2012  .  Esophageal dysphagia 01/24/2012  . Chronic diarrhea 01/24/2012  . Depression 06/22/2011  . GERD (gastroesophageal reflux disease) 06/22/2011  . DM (diabetes mellitus) ([place] 06/22/2011  . Hypercholesterolemia 06/22/2011  . Hypertension 06/22/2011  . Obesity 06/22/2011  . 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[place] 05/22/2011    Past Surgical History:  Procedure Laterality Date  . back surg x2    . CHOLECYSTECTOMY    . COLONOSCOPY  11/03/07   4-mm sessile polyp removed/small internal hemorrhoids/tubular adenoma, random colon bx negative for microscopic colitis  . COLONOSCOPY WITH PROPOFOL N/A 09/17/2016   Procedure: COLONOSCOPY WITH PROPOFOL;  Surgeon: RDaneil Dolin MD;  [facility];  Service: Endoscopy;  Laterality: N/A;  9:00 am  . COLONOSCOPY, ESOPHAGOGASTRODUODENOSCOPY (EGD) AND ESOPHAGEAL DILATION  01/2012   SLF: empiric esophageal dilation, gastritis with benign bx, hemorrhoids  . DILATATION & CURETTAGE/HYSTEROSCOPY WITH MYOSURE N/A 02/10/2015   Procedure: DILATATION & CURETTAGE/HYSTEROSCOPY WITH MYOSURE;  Surgeon: BNunzio Cobbs MD;  [facility];  Service: Gynecology;  Laterality: N/A;  . MASTECTOMY PARTIAL / LUMPECTOMY     rt.  . MM BREAST STEREO BX*L*R/S     rt.  .Marland KitchenPOLYPECTOMY  09/17/2016   Procedure: POLYPECTOMY;  Surgeon: RDaneil Dolin MD;  Location: [facility] ENDO SUITE;  Service: Endoscopy;;  ascending colon  . PORT-A-CATH REMOVAL  05/26/2012   Procedure: REMOVAL PORT-A-CATH;  Surgeon: MJamesetta So MD;  Location: [facility] ORS;  Service: General;  Laterality: N/A;  Minor Room  . PORTACATH PLACEMENT      OB History    Gravida Para Term Preterm AB Living   4 3 0 0 1 3   SAB TAB Ectopic Multiple Live Births   0 0 0 0 3       Home Medications    Prior to Admission medications   Medication Sig Start Date End Date Taking? Authorizing Provider  atorvastatin (LIPITOR) 40 MG tablet Take 1 tablet (40 mg total) by mouth daily. 01/11/17 10/15/17 Yes  BranchAlphonse Guild, MD  citalopram (CELEXA) 40 MG tablet Take 1 tablet (40 mg total) by mouth daily. 06/07/17  Yes Mikey Kirschner, MD  diazepam (VALIUM) 5 MG tablet Take 1 tablet by mouth daily as needed for anxiety.  01/02/17  Yes [provider]  lisinopril (PRINIVIL,ZESTRIL) 10 MG tablet TAKE ONE TABLET BY MOUTH ONCE DAILY   **DISCONTINUE LISINOPRIL/HCTZ** Patient taking differently: Take 10 mg by mouth daily.  06/07/17  Yes Mikey Kirschner, MD  metFORMIN (GLUCOPHAGE) 500 MG tablet Take 1 tablet (500 mg total) by mouth 2 (two) times daily. 06/07/17  Yes Mikey Kirschner, MD  metroNIDAZOLE (FLAGYL) 500 MG tablet Take 500 mg by mouth 2 (two) times daily. [place] ON 10/08/2017 10/08/17  Yes [provider]  pantoprazole (PROTONIX) 40 MG tablet TAKE 1 TABLET BY MOUTH ONCE DAILY FOR  ACID  REFLUX 06/07/17  Yes Mikey Kirschner, MD  blood glucose meter kit and supplies KIT Dispense based on patient and insurance preference. TEST ONCE DAILY. DIABETES TYPE 2 ICD 10 CODE E11.9 12/07/16   Mikey Kirschner, MD  predniSONE (DELTASONE) 10 MG tablet Take 2 tablets (20 mg total) by mouth daily. 10/16/17   Nat Christen, MD    Family History Family History  Problem Relation Age of Onset  . Colon cancer Mother        mid-50s, died of metastatic disease  . Cancer Mother        liver  . Coronary artery disease Mother   . Diabetes type I Mother   . Peripheral vascular disease Mother        Carotid disease in her 53s  . Coronary artery disease Father        CAD in his 30s  . Diabetes Father   . Diabetes type I Father   . Hypertension Father   . Bipolar disorder Sister   . Coronary artery disease Brother        MI in his 60s  . Hypertension Brother   . Hypertension Maternal Grandmother   . Hyperlipidemia Maternal Grandmother   . Stroke Maternal Grandmother   . Hypertension Maternal Grandfather   . Diabetes Maternal Grandfather   . Diabetes Paternal Grandmother   .  Heart disease Paternal Grandfather     Social History Social History   Tobacco Use  . Smoking status: Never Smoker  . Smokeless tobacco: Never Used  Substance Use Topics  . Alcohol use: No    Alcohol/week: 0.0 oz  . Drug use: No     Allergies   Flagyl [metronidazole] and Pravastatin   Review of Systems Review of Systems  All other systems reviewed and are negative.    Physical Exam Updated Vital Signs BP (!) 126/54 (BP Location: Left Arm)   Pulse 89   Temp 98.3 F (36.8 C) (Oral)   Resp 18   Ht _0  (1.626 m)   Wt 114.8 kg (253 lb)  LMP 04/05/2016 Comment: spotting 11/2016 and 12/2016  SpO2 94%   BMI 43.43 kg/m   Physical Exam  Constitutional: She is oriented to person, place, and time. She appears well-developed and well-nourished.  HENT:  Head: Normocephalic and atraumatic.  Eyes: Conjunctivae are normal.  Neck: Neck supple.  Cardiovascular: Normal rate and regular rhythm.  Pulmonary/Chest: Effort normal and breath sounds normal.  Abdominal: Soft. Bowel sounds are normal.  Musculoskeletal: Normal range of motion.  Neurological: She is alert and oriented to person, place, and time.  Skin:  Diffuse erythematous papular rash consistent with urticaria  Psychiatric: She has a normal mood and affect. Her behavior is normal.  Nursing note and vitals reviewed.    ED Treatments / Results  Labs (all labs ordered are listed, but only abnormal results are displayed) Labs Reviewed - No data to display  EKG  EKG Interpretation None       Radiology No results found.  Procedures Procedures (including critical care time)  Medications Ordered in ED Medications  famotidine (PEPCID) IVPB 20 mg premix (0 mg Intravenous Stopped 10/15/17 1953)  diphenhydrAMINE (BENADRYL) injection 25 mg (25 mg Intravenous Given 10/15/17 1922)  methylPREDNISolone sodium succinate (SOLU-MEDROL) 125 mg/2 mL injection 125 mg (125 mg Intravenous Given 10/15/17 1923)    diphenhydrAMINE (BENADRYL) injection 25 mg (25 mg Intravenous Given 10/16/17 0000)     Initial Impression / Assessment and Plan / ED Course  I have reviewed the triage vital signs and the nursing notes.  Pertinent labs & imaging results that were available during my care of the patient were reviewed by me and considered in my medical decision making (see chart for details).     Patient presents with allergic reaction.  She responded well to IV steroids, IV Pepcid, IV Benadryl.  Will discharge home on oral prednisone and Benadryl.  Discussed with patient and her significant other.  1215: Patient reports recurrence of pruritic rash on left forehead, arms, legs..  She is able to swallow.  Epinephrine 0.3 mg given, Solu-Medrol 60 mg IV, Pepcid 20 mg IV,  Disc c Dr Roxanne Mins.  Final Clinical Impressions(s) / ED Diagnoses   Final diagnoses:  Allergic reaction, initial encounter    ED Discharge Orders        Ordered    predniSONE (DELTASONE) 10 MG tablet  Daily     10/16/17 0003       Nat Christen, MD 10/16/17 [place]    Nat Christen, MD 10/16/17 [phone]

## 2017-10-16 NOTE — ED Provider Notes (Signed)
[facility] EMERGENCY DEPARTMENT Provider Note   CSN: [id number] Arrival date & time: 10/16/17  0350     History   Chief Complaint Chief Complaint  Patient presents with  . Allergic Reaction    HPI [name] is a 51 y.o. female.  The history is provided by the patient.  She was in the emergency department earlier this evening with an allergic reaction-presumably secondary to metronidazole.  She had received epinephrine, diphenhydramine, famotidine, methylprednisolone.  After getting home, she had recurrence of hives.  Hives are present on her legs, trunk, arms, face.  There is slight tightness in her throat but no true difficulty breathing or swallowing.  Past Medical History:  Diagnosis Date  . Anxiety   . Blood transfusion without reported diagnosis 1999   due to heavy menses  . Breast cancer ([place]) 05/22/2011   03/01/11, Stage 2, s/p lumpectomy, chemo/xrt  . DDD (degenerative disc disease), lumbar   . Depression 06/22/2011  . Diverticulitis   . DM (diabetes mellitus) ([place]) 06/22/2011  . Genital warts   . GERD (gastroesophageal reflux disease) 06/22/2011  . Hx of adenomatous colonic polyps 10/2007   50m sigmoid tubular adenoma, FH colon cancer, mother in mid-50s  . Hypercholesterolemia 06/22/2011  . Hypertension 06/22/2011  . 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[place] 05/22/2011  . Iron deficiency anemia 03/25/2015  . Morbid obesity ([place]   . Shortness of breath   . 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 Vaginal bleeding 03/08/2014    Patient Active Problem List   Diagnosis Date Noted  . Chest pain 02/27/2017  . Rectal bleeding 09/06/2016  . Abdominal cramping 09/06/2016  . Fatty liver 09/06/2016  . Dehydration 12/15/2015  . Orthostatic dizziness 12/15/2015  . Nausea vomiting and diarrhea 12/15/2015  . Iron deficiency anemia 03/25/2015  . Excessive or frequent menstruation 04/08/2014  . Dysmenorrhea 04/08/2014  . H/O  adenomatous polyp of colon 01/24/2012  . FH: colon cancer 01/24/2012  . Esophageal dysphagia 01/24/2012  . Chronic diarrhea 01/24/2012  . Depression 06/22/2011  . GERD (gastroesophageal reflux disease) 06/22/2011  . DM (diabetes mellitus) ([place] 06/22/2011  . Hypercholesterolemia 06/22/2011  . Hypertension 06/22/2011  . Obesity 06/22/2011  . 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[facility] 05/22/2011    Past Surgical History:  Procedure Laterality Date  . back surg x2    . CHOLECYSTECTOMY    . COLONOSCOPY  11/03/07   4-mm sessile polyp removed/small internal hemorrhoids/tubular adenoma, random colon bx negative for microscopic colitis  . COLONOSCOPY WITH PROPOFOL N/A 09/17/2016   Procedure: COLONOSCOPY WITH PROPOFOL;  Surgeon: RDaneil Dolin MD;  [facility];  Service: Endoscopy;  Laterality: N/A;  9:00 am  . COLONOSCOPY, ESOPHAGOGASTRODUODENOSCOPY (EGD) AND ESOPHAGEAL DILATION  01/2012   SLF: empiric esophageal dilation, gastritis with benign bx, hemorrhoids  . DILATATION & CURETTAGE/HYSTEROSCOPY WITH MYOSURE N/A 02/10/2015   Procedure: DILATATION & CURETTAGE/HYSTEROSCOPY WITH MYOSURE;  Surgeon: BNunzio Cobbs MD;  [facility];  Service: Gynecology;  Laterality: N/A;  . MASTECTOMY PARTIAL / LUMPECTOMY     rt.  . MM BREAST STEREO BX*L*R/S     rt.  .Marland KitchenPOLYPECTOMY  09/17/2016   Procedure: POLYPECTOMY;  Surgeon: RDaneil Dolin MD;  Location: [facility] END[facility] SU[facility];  Service: Endoscopy;;  ascending colon  . PORT-A-CATH REMOVAL  05/26/2012   Procedure: REMOVAL PORT-A-CATH;  Surgeon: MJamesetta So MD;  Location: [facility] ORS;  Service: General;  Laterality: N/A;  Minor Room  . PORTACATH PLACEMENT      OB History    Gravida Para Term Preterm AB Living   4 3 0 0 1 3   SAB TAB Ectopic Multiple Live Births   0 0 0 0 3       Home Medications    Prior to Admission medications   Medication Sig Start Date End Date Taking? Authorizing Provider  atorvastatin (LIPITOR) 40  MG tablet Take 1 tablet (40 mg total) by mouth daily. 01/11/17 10/15/17  Arnoldo Lenis, MD  blood glucose meter kit and supplies KIT Dispense based on patient and insurance preference. TEST ONCE DAILY. DIABETES TYPE 2 ICD 10 CODE E11.9 12/07/16   Mikey Kirschner, MD  citalopram (CELEXA) 40 MG tablet Take 1 tablet (40 mg total) by mouth daily. 06/07/17   Mikey Kirschner, MD  diazepam (VALIUM) 5 MG tablet Take 1 tablet by mouth daily as needed for anxiety.  01/02/17   [provider]  lisinopril (PRINIVIL,ZESTRIL) 10 MG tablet TAKE ONE TABLET BY MOUTH ONCE DAILY   **DISCONTINUE LISINOPRIL/HCTZ** Patient taking differently: Take 10 mg by mouth daily.  06/07/17   Mikey Kirschner, MD  metFORMIN (GLUCOPHAGE) 500 MG tablet Take 1 tablet (500 mg total) by mouth 2 (two) times daily. 06/07/17   Mikey Kirschner, MD  metroNIDAZOLE (FLAGYL) 500 MG tablet Take 500 mg by mouth 2 (two) times daily. [place] ON 10/08/2017 10/08/17   [provider]  pantoprazole (PROTONIX) 40 MG tablet TAKE 1 TABLET BY MOUTH ONCE DAILY FOR  ACID  REFLUX 06/07/17   Mikey Kirschner, MD  predniSONE (DELTASONE) 20 MG tablet Take 1 tablet (20 mg total) by mouth daily with breakfast. 3 tablets for 2 days, 2 tablets for 2 days, 1 tablet for 2 days. 10/16/17   Nat Christen, MD    Family History Family History  Problem Relation Age of Onset  . Colon cancer Mother        mid-50s, died of metastatic disease  . Cancer Mother        liver  . Coronary artery disease Mother   . Diabetes type I Mother   . Peripheral vascular disease Mother        Carotid disease in her 40s  . Coronary artery disease Father        CAD in his 11s  . Diabetes Father   . Diabetes type I Father   . Hypertension Father   . Bipolar disorder Sister   . Coronary artery disease Brother        MI in his 69s  . Hypertension Brother   . Hypertension Maternal Grandmother   . Hyperlipidemia Maternal Grandmother   . Stroke  Maternal Grandmother   . Hypertension Maternal Grandfather   . Diabetes Maternal Grandfather   . Diabetes Paternal Grandmother   . Heart disease Paternal Grandfather     Social History Social History   Tobacco Use  . Smoking status: Never Smoker  . Smokeless tobacco: Never Used  Substance Use Topics  . Alcohol use: No    Alcohol/week: 0.0 oz  . Drug use: No     Allergies   Flagyl [metronidazole] and Pravastatin   Review of Systems Review of Systems  All other systems reviewed and are negative.    Physical Exam Updated Vital Signs BP 128/70 (BP Location: Left Arm)   Pulse 99   Temp 98.5 F (36.9 C) (Oral)  Resp 16   Ht '5\' 4"'$  (1.626 m)   Wt 114.8 kg (253 lb)   LMP 04/05/2016 Comment: spotting 11/2016 and 12/2016  SpO2 94%   BMI 43.43 kg/m   Physical Exam  Nursing note and vitals reviewed.  51 year old female, resting comfortably and in no acute distress. Vital signs are normal. Oxygen saturation is 94%, which is normal. Head is normocephalic and atraumatic. PERRLA, EOMI. Oropharynx is clear.  There are no mucosal lesions.  There is no pooling of secretions.  Phonation is normal. Neck is nontender and supple without adenopathy or JVD. Back is nontender and there is no CVA tenderness. Lungs are clear without rales, wheezes, or rhonchi. Chest is nontender. Heart has regular rate and rhythm without murmur. Abdomen is soft, flat, nontender without masses or hepatosplenomegaly and peristalsis is normoactive. Extremities have no cyanosis or edema, full range of motion is present. Skin: Urticarial rash is seen on arms, legs, trunk, face. Neurologic: Mental status is normal, cranial nerves are intact, there are no motor or sensory deficits.  ED Treatments / Results   Procedures Procedures (including critical care time)  Medications Ordered in ED Medications  diphenhydrAMINE (BENADRYL) capsule 50 mg (50 mg Oral Given 10/16/17 0452)  loratadine (CLARITIN) tablet  10 mg (10 mg Oral Given 10/16/17 0452)     Initial Impression / Assessment and Plan / ED Course  I have reviewed the triage vital signs and the nursing notes.  Urticaria which may be related to metronidazole.  Old records are reviewed confirming ED visit earlier this evening.  Last dose of diphenhydramine was at 11:30 PM.  She is already received 2 doses of famotidine and 2 doses of methylprednisolone, so I do not feel additional doses of either of those are warranted.  She is not ill enough to warrant additional dose of epinephrine.  She is given dose of oral diphenhydramine and will be observed.  With above-noted treatment, she had gradual lessening of her urticarial rash.  It did not go away completely.  At this point, it actually is showing target lesions suggestive of erythema multiforme.  At no point was she having any respiratory symptoms.  I discussed with her that the rash may persist for some time, but emergent treatment is only necessary if there are respiratory symptoms.  She is advised to resume her previous treatment plan but told that she could return should symptoms worsen.  Final Clinical Impressions(s) / ED Diagnoses   Final diagnoses:  Allergic reaction, subsequent encounter    ED Discharge Orders    None       Delora Fuel, MD 85/46/27 [phone]

## 2017-10-16 NOTE — ED Notes (Signed)
Pt on cardiac monitor.

## 2017-10-16 NOTE — Discharge Instructions (Signed)
Continue previous treatment plan.

## 2017-10-16 NOTE — Discharge Instructions (Addendum)
Prescription for prednisone.  Can continue to take Benadryl every 4-6 hrs.  Return if worse.

## 2017-10-16 NOTE — ED Provider Notes (Signed)
Patient with allergic reaction signed out to me to observe following administration of epinephrine.  She was observed for several hours with complete resolution of her rash.  She was felt to be stable for discharge.  She was discharged with prescription for prednisone, advised to use cetirizine or loratadine once a day with supplemental diphenhydramine as needed.   Delora Fuel, MD 24/81/85 [phone]

## 2017-10-16 NOTE — ED Triage Notes (Signed)
Pt states when she arrived home from the hospital that she started breaking out and having an allergic reaction. Pt denies difficulty breathing or swelling of her throat. Pt states that her throat feels scratchy and that the rash covering her body itches.

## 2017-10-16 NOTE — Telephone Encounter (Signed)
Spoke with patient. Patient states that last night her hives began to spread and she started to have trouble swallowing. Was seen at the ED and was given epinephrine, diphenhydramine, famotidine, and methylprednisolone. Was sent home and symptoms worsened. Returned to ED for further treatment with oral dose of diphenhydramine. No respiratory problems. Sent home with prednisone and is taking OTC diphenhydramine. Reports symptoms have cleared up a lot. No trouble swallowing or breathing. Will continue prednisone taper. Patient states she completed 5 doses of Flagyl before reaction occurred. Patient is scheduled for a PUS tomorrow with Dr.Silva. Aware Dr.Silva will be out oft he office tomorrow. Requests to be seen by another MD as she is concerned about LLQ pain and pain with intercourse. Worried about a cyst. PUS moved to 1 pm with 1:30 pm consult with Dr.Miller. Aware she will be contact with any additional recommendations from covering MD.

## 2017-10-17 ENCOUNTER — Ambulatory Visit (INDEPENDENT_AMBULATORY_CARE_PROVIDER_SITE_OTHER): Payer: BLUE CROSS/BLUE SHIELD

## 2017-10-17 ENCOUNTER — Other Ambulatory Visit: Payer: Self-pay | Admitting: *Deleted

## 2017-10-17 ENCOUNTER — Ambulatory Visit (INDEPENDENT_AMBULATORY_CARE_PROVIDER_SITE_OTHER): Payer: BLUE CROSS/BLUE SHIELD | Admitting: Obstetrics & Gynecology

## 2017-10-17 ENCOUNTER — Other Ambulatory Visit: Payer: Self-pay | Admitting: Obstetrics and Gynecology

## 2017-10-17 ENCOUNTER — Other Ambulatory Visit: Payer: Self-pay

## 2017-10-17 VITALS — BP 142/78 | HR 100 | Resp 16 | Ht 64.0 in | Wt 253.0 lb

## 2017-10-17 DIAGNOSIS — R1032 Left lower quadrant pain: Secondary | ICD-10-CM

## 2017-10-17 DIAGNOSIS — N941 Unspecified dyspareunia: Secondary | ICD-10-CM

## 2017-10-17 DIAGNOSIS — T7840XA Allergy, unspecified, initial encounter: Secondary | ICD-10-CM

## 2017-10-17 DIAGNOSIS — N898 Other specified noninflammatory disorders of vagina: Secondary | ICD-10-CM | POA: Diagnosis not present

## 2017-10-17 DIAGNOSIS — D251 Intramural leiomyoma of uterus: Secondary | ICD-10-CM

## 2017-10-17 NOTE — Progress Notes (Signed)
GYNECOLOGY  VISIT  CC:   Ultrasound evaluation of LLQ pain  HPI: 51 y.o. [id number] Divorced Caucasian female here for ultrasound to further evaluate LLQ pain.  Pt is followed by Dr. Quincy Simmonds but she was unavilalbe today and pt desired to proceed with testing.    Pt was treated with flagyl for BV that was diagnosed 10/03/17.  She called on 10/15/17 to report reaction.  Was advised to use Benadryl, stop the Flagyl, and ER precautions were given.  Reaction continued after advice given and pt decided to be seen in ER.  She was treated with epinephrine in ER and given prednisone as well as being advised to use cetrizine as well as benadryl.  Intiially symptoms were much improved but pt worsened again and was seen again in ER.  Was treated with Benadryl and monitored.  Symptoms are now much improved.  metronidazole has been added as allergy to pt's allergy list.  Medications in this class also not recommended for pt to take.  Ultrasound obtained today.  Results showed: Uterus:  8.2 x 5.8 x 4.3cm with 1.3 x 1.0cm, 1.1 x 1.4cm, and 0.8 x 0.8cm fibroids (comparison ultrasound in 10/15 shows these are all slightly smaller).  Possible adenomyosis present as well. Endometrium: 3.24m Left ovary: 2.3 x 1.7 x 1.6cm Right ovary:  2.4 x 2.4 x 1.5cm Cul de sac: negative  Findings reviewed with pt.  Asked pt about GI habits.  She feels she is lactose intolerant.  Has rapid bowel movements that are loose and has LLQ cramping whenever she eats dairy.  Reports she loves cheese and has loose stools, diarrhea frequently.    GYNECOLOGIC HISTORY: Patient's last menstrual period was 04/05/2016. Contraception: PMP Menopausal hormone therapy: none  Patient Active Problem List   Diagnosis Date Noted  . Chest pain 02/27/2017  . Rectal bleeding 09/06/2016  . Abdominal cramping 09/06/2016  . Fatty liver 09/06/2016  . Dehydration 12/15/2015  . Orthostatic dizziness 12/15/2015  . Nausea vomiting and diarrhea 12/15/2015  .  Iron deficiency anemia 03/25/2015  . Excessive or frequent menstruation 04/08/2014  . Dysmenorrhea 04/08/2014  . H/O adenomatous polyp of colon 01/24/2012  . FH: colon cancer 01/24/2012  . Esophageal dysphagia 01/24/2012  . Chronic diarrhea 01/24/2012  . Depression 06/22/2011  . GERD (gastroesophageal reflux disease) 06/22/2011  . DM (diabetes mellitus) ([facility] 06/22/2011  . Hypercholesterolemia 06/22/2011  . Hypertension 06/22/2011  . Obesity 06/22/2011  . 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[facility] 05/22/2011    Past Medical History:  Diagnosis Date  . Anxiety   . Blood transfusion without reported diagnosis 1999   due to heavy menses  . Breast cancer ([facility] 05/22/2011   03/01/11, Stage 2, s/p lumpectomy, chemo/xrt  . DDD (degenerative disc disease), lumbar   . Depression 06/22/2011  . Diverticulitis   . DM (diabetes mellitus) ([facility] 06/22/2011  . Genital warts   . GERD (gastroesophageal reflux disease) 06/22/2011  . Hx of adenomatous colonic polyps 10/2007   480msigmoid tubular adenoma, FH colon cancer, mother in mid-50s  . Hypercholesterolemia 06/22/2011  . Hypertension 06/22/2011  . 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[facility]7/01/2011  . Iron deficiency anemia 03/25/2015  . Morbid obesity ([facility] [place]  . Shortness of breath   . 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 Vaginal bleeding 03/08/2014    Past Surgical History:  Procedure Laterality Date  . back surg x2    . CHOLECYSTECTOMY    .  COLONOSCOPY  11/03/07   4-mm sessile polyp removed/small internal hemorrhoids/tubular adenoma, random colon bx negative for microscopic colitis  . COLONOSCOPY WITH PROPOFOL N/A 09/17/2016   Procedure: COLONOSCOPY WITH PROPOFOL;  Surgeon: Daneil Dolin, MD;  [facility];  Service: Endoscopy;  Laterality: N/A;  9:00 am  . COLONOSCOPY, ESOPHAGOGASTRODUODENOSCOPY (EGD) AND ESOPHAGEAL DILATION  01/2012   SLF: empiric esophageal dilation, gastritis with  benign bx, hemorrhoids  . DILATATION & CURETTAGE/HYSTEROSCOPY WITH MYOSURE N/A 02/10/2015   Procedure: DILATATION & CURETTAGE/HYSTEROSCOPY WITH MYOSURE;  Surgeon: Nunzio Cobbs, MD;  Location: [facility];  Service: Gynecology;  Laterality: N/A;  . MASTECTOMY PARTIAL / LUMPECTOMY     rt.  . MM BREAST STEREO BX*L*R/S     rt.  Marland Kitchen POLYPECTOMY  09/17/2016   Procedure: POLYPECTOMY;  Surgeon: Daneil Dolin, MD;  Location: [facility];  Service: Endoscopy;;  ascending colon  . PORT-A-CATH REMOVAL  05/26/2012   Procedure: REMOVAL PORT-A-CATH;  Surgeon: Jamesetta So, MD;  Location: [facility];  Service: General;  Laterality: N/A;  Minor Room  . PORTACATH PLACEMENT      MEDS:   Current Outpatient Medications on File Prior to Visit  Medication Sig Dispense Refill  . atorvastatin (LIPITOR) 40 MG tablet Take 1 tablet (40 mg total) by mouth daily. 90 tablet 3  . blood glucose meter kit and supplies KIT Dispense based on patient and insurance preference. TEST ONCE DAILY. DIABETES TYPE 2 ICD 10 CODE E11.9 1 each 5  . citalopram (CELEXA) 40 MG tablet Take 1 tablet (40 mg total) by mouth daily. 90 tablet 1  . diazepam (VALIUM) 5 MG tablet Take 1 tablet by mouth daily as needed for anxiety.   0  . diphenhydrAMINE (BENADRYL) 25 mg capsule Take 25 mg by mouth every 8 (eight) hours as needed.    Marland Kitchen lisinopril (PRINIVIL,ZESTRIL) 10 MG tablet TAKE ONE TABLET BY MOUTH ONCE DAILY   **DISCONTINUE LISINOPRIL/HCTZ** (Patient taking differently: Take 10 mg by mouth daily. ) 90 tablet 1  . metFORMIN (GLUCOPHAGE) 500 MG tablet Take 1 tablet (500 mg total) by mouth 2 (two) times daily. 180 tablet 1  . pantoprazole (PROTONIX) 40 MG tablet TAKE 1 TABLET BY MOUTH ONCE DAILY FOR  ACID  REFLUX 90 tablet 1  . predniSONE (DELTASONE) 20 MG tablet Take 1 tablet (20 mg total) by mouth daily with breakfast. 3 tablets for 2 days, 2 tablets for 2 days, 1 tablet for 2 days. 12 tablet 0  . [DISCONTINUED] gabapentin (NEURONTIN) 300 MG  capsule Take 1 capsule (300 mg total) by mouth 3 (three) times daily. 100 capsule 3  . [DISCONTINUED] potassium chloride (KCL) 2 mEq/mL SOLN oral liquid Take 10 mL BID for 21 days, then 10 mL daily, PO 300 mL 1   No current facility-administered medications on file prior to visit.     ALLERGIES: Flagyl [metronidazole] and Pravastatin  Family History  Problem Relation Age of Onset  . Colon cancer Mother        mid-50s, died of metastatic disease  . Cancer Mother        liver  . Coronary artery disease Mother   . Diabetes type I Mother   . Peripheral vascular disease Mother        Carotid disease in her 17s  . Coronary artery disease Father        CAD in his 29s  . Diabetes Father   . Diabetes type I Father   .  Hypertension Father   . Bipolar disorder Sister   . Coronary artery disease Brother        MI in his 84s  . Hypertension Brother   . Hypertension Maternal Grandmother   . Hyperlipidemia Maternal Grandmother   . Stroke Maternal Grandmother   . Hypertension Maternal Grandfather   . Diabetes Maternal Grandfather   . Diabetes Paternal Grandmother   . Heart disease Paternal Grandfather     SH:  Divorced, non smoker  ROS  PHYSICAL EXAMINATION:    BP (!) 142/78 (BP Location: Right Arm, Patient Position: Sitting, Cuff Size: Large)   Pulse 100   Resp 16   Ht '5\' 4"'$  (1.626 m)   Wt 253 lb (114.8 kg)   LMP 04/05/2016 Comment: spotting 11/2016 and 12/2016  BMI 43.43 kg/m     General appearance: alert, cooperative and appears stated age Abdomen: soft, non-tender; bowel sounds normal; no masses,  no organomegaly  Pelvic: External genitalia:  no lesions              Urethra:  normal appearing urethra with no masses, tenderness or lesions              Bartholins and Skenes: normal                 Vagina: normal appearing vagina with normal color and slight watery discharge, no lesions              Cervix: no lesions              Bimanual Exam:  Uterus:  normal size,  contour, position, consistency, mobility, non-tender              Adnexa: no mass, fullness, tenderness              Anus:  normal sphincter tone, no lesions   Pelvic floor:  No tenderness to palpation of pelvic floor  Chaperone was present for exam.  Assessment: LLQ pain Ultrasound with small fibroids, possible prior adenomyosis PMP, no HRT Possible lactose intolerance Prior BV with anaphylaxis to metronidazole, unsure if had complete treatment  Plan: Repeat BV/yeast testing obtained today Reassured about ultrasound Feel pt should try one month of stopping cheese and dairy (or use lactose free diary) and probiotic (Align) to see if this will help LLQ pain.  Feel GI source is more likely cause of pain. Will plan to follow up with Dr. Quincy Simmonds   ~30 minutes spent with patient >50% of time was in face to face discussion of above.

## 2017-10-19 ENCOUNTER — Encounter: Payer: Self-pay | Admitting: Obstetrics & Gynecology

## 2017-10-21 LAB — NUSWAB VAGINITIS (VG)
CANDIDA ALBICANS, NAA: NEGATIVE
Candida glabrata, NAA: NEGATIVE
Trich vag by NAA: NEGATIVE

## 2017-11-14 ENCOUNTER — Encounter: Payer: Self-pay | Admitting: Family Medicine

## 2017-11-14 ENCOUNTER — Ambulatory Visit: Payer: BLUE CROSS/BLUE SHIELD | Admitting: Family Medicine

## 2017-11-14 VITALS — BP 130/88 | Temp 98.5°F | Ht 64.0 in | Wt 255.0 lb

## 2017-11-14 DIAGNOSIS — J329 Chronic sinusitis, unspecified: Secondary | ICD-10-CM

## 2017-11-14 DIAGNOSIS — Z23 Encounter for immunization: Secondary | ICD-10-CM | POA: Diagnosis not present

## 2017-11-14 DIAGNOSIS — J310 Chronic rhinitis: Secondary | ICD-10-CM

## 2017-11-14 MED ORDER — AMOXICILLIN-POT CLAVULANATE 875-125 MG PO TABS: ORAL_TABLET | ORAL | 0 refills | Status: DC

## 2017-11-14 MED ORDER — ALBUTEROL SULFATE HFA 108 (90 BASE) MCG/ACT IN AERS: 2.0000 | INHALATION_SPRAY | Freq: Four times a day (QID) | RESPIRATORY_TRACT | 2 refills | Status: DC | PRN

## 2017-11-14 NOTE — Progress Notes (Signed)
   Subjective:    Patient ID: [name], female    DOB: 1966-01-29, 51 y.o.   MRN: [id number]  HPI  Patient is here today with complaints of trouble breathing and  Chest congestion for a week and a half. She states she has been taking mucines,but is having trouble getting the mucus up. Chest is tight from the congestion, and cant catch breath all that well.   No sig fever  Ongoing cough  Prod t times  Ten d dur   Dim energy   Some wheezing  Review of Systems No headache, no major weight loss or weight gain, no chest pain no back pain abdominal pain no change in bowel habits complete ROS otherwise negative     Objective:   Physical Exam Alert, mild malaise. Hydration good Vitals stable. frontal/ maxillary tenderness evident positive nasal congestion. pharynx normal neck supple  lungs clear/no crackles with cough positive for wheezes. heart regular in rhythm        Assessment & Plan:  Impression rhinosinusitis/bronchitis reactive likely post viral, discussed with patient. plan antibiotics prescribed. Questions answered. Symptomatic care discussed. warning signs discussed. WSL

## 2017-12-07 ENCOUNTER — Other Ambulatory Visit: Payer: Self-pay | Admitting: Family Medicine

## 2017-12-12 ENCOUNTER — Ambulatory Visit: Payer: BLUE CROSS/BLUE SHIELD | Admitting: Nurse Practitioner

## 2017-12-12 ENCOUNTER — Encounter: Payer: Self-pay | Admitting: Nurse Practitioner

## 2017-12-12 VITALS — BP 136/84 | Ht 64.0 in | Wt 258.0 lb

## 2017-12-12 DIAGNOSIS — I10 Essential (primary) hypertension: Secondary | ICD-10-CM

## 2017-12-12 DIAGNOSIS — E119 Type 2 diabetes mellitus without complications: Secondary | ICD-10-CM | POA: Diagnosis not present

## 2017-12-12 DIAGNOSIS — F338 Other recurrent depressive disorders: Secondary | ICD-10-CM | POA: Diagnosis not present

## 2017-12-12 DIAGNOSIS — K219 Gastro-esophageal reflux disease without esophagitis: Secondary | ICD-10-CM

## 2017-12-12 DIAGNOSIS — Z79899 Other long term (current) drug therapy: Secondary | ICD-10-CM

## 2017-12-12 DIAGNOSIS — E7800 Pure hypercholesterolemia, unspecified: Secondary | ICD-10-CM

## 2017-12-12 DIAGNOSIS — F324 Major depressive disorder, single episode, in partial remission: Secondary | ICD-10-CM

## 2017-12-12 DIAGNOSIS — R5383 Other fatigue: Secondary | ICD-10-CM

## 2017-12-12 LAB — POCT GLYCOSYLATED HEMOGLOBIN (HGB A1C): Hemoglobin A1C: 6.1

## 2017-12-12 MED ORDER — CITALOPRAM HYDROBROMIDE 40 MG PO TABS: 40.0000 mg | ORAL_TABLET | Freq: Every day | ORAL | 1 refills | Status: DC

## 2017-12-12 MED ORDER — TRAMADOL HCL 50 MG PO TABS: 50.0000 mg | ORAL_TABLET | Freq: Three times a day (TID) | ORAL | 0 refills | Status: DC | PRN

## 2017-12-12 MED ORDER — PANTOPRAZOLE SODIUM 40 MG PO TBEC: DELAYED_RELEASE_TABLET | ORAL | 1 refills | Status: DC

## 2017-12-12 MED ORDER — METFORMIN HCL 500 MG PO TABS: 500.0000 mg | ORAL_TABLET | Freq: Two times a day (BID) | ORAL | 1 refills | Status: DC

## 2017-12-12 NOTE — Patient Instructions (Signed)
Multivitamin for women over 50 OR B vitamin complex with selenium

## 2017-12-15 ENCOUNTER — Encounter: Payer: Self-pay | Admitting: Nurse Practitioner

## 2017-12-15 DIAGNOSIS — F338 Other recurrent depressive disorders: Secondary | ICD-10-CM | POA: Insufficient documentation

## 2017-12-15 NOTE — Progress Notes (Signed)
Subjective:  Presents for recheck on her chronic health issues. Takes Protonix daily for GERD. Well controlled, but only if she takes daily. Concerned about cancer risk. Was doing regular walking but injured her back after falling in a hole in September. Has had back surgery in the past 2002. Mainly right back and leg pain. No change in bowel or bladder habits. Pain radiates down the right buttock into the thigh area. No CP/ischemic type pain or SOB. Notices more depression in the winter time especially after the time changes.   Objective:   BP 136/84   Ht 5\' 4"  (1.626 m)   Wt 258 lb (117 kg)   LMP 04/05/2016 Comment: spotting 11/2016 and 12/2016  BMI 44.29 kg/m  NAD. Alert, oriented. Lungs clear. Heart RRR. Carotids no bruits or thrills. Abdomen soft, non tender. Tenderness noted in the right lumbar area into the right buttock. SLR neg left, pos right. Reflexes normal lower extremities. Gait normal. Decreased flexion of the back at 45 degrees. HbA1C today 6.1.  Assessment:   Problem List Items Addressed This Visit      Cardiovascular and Mediastinum   Hypertension   Relevant Orders   Basic metabolic panel (Completed)     Digestive   GERD (gastroesophageal reflux disease)   Relevant Medications   pantoprazole (PROTONIX) 40 MG tablet     Other   Depression   Relevant Medications   citalopram (CELEXA) 40 MG tablet   Hypercholesterolemia   Relevant Orders   Lipid panel (Completed)   Seasonal affective disorder (HCC)   Relevant Medications   citalopram (CELEXA) 40 MG tablet    Other Visit Diagnoses    Diabetes mellitus without complication ([place])    -  Primary   Relevant Medications   metFORMIN (GLUCOPHAGE) 500 MG tablet   Other Relevant Orders   POCT glycosylated hemoglobin (Hb A1C) (Completed)   Microalbumin / creatinine urine ratio (Completed)   High risk medication use       Relevant Orders   Hepatic function panel (Completed)   Other fatigue       Relevant Orders   TSH  (Completed)   VITAMIN D 25 Hydroxy (Vit-D Deficiency, Fractures) (Completed)       Plan:   Meds ordered this encounter  Medications  . citalopram (CELEXA) 40 MG tablet    Sig: Take 1 tablet (40 mg total) by mouth daily.    Dispense:  90 tablet    Refill:  1    Order Specific Question:   Supervising Provider    Answer:   Mikey Kirschner [[id number]]  . metFORMIN (GLUCOPHAGE) 500 MG tablet    Sig: Take 1 tablet (500 mg total) by mouth 2 (two) times daily.    Dispense:  180 tablet    Refill:  1    Order Specific Question:   Supervising Provider    Answer:   Mikey Kirschner [[id number]]  . pantoprazole (PROTONIX) 40 MG tablet    Sig: TAKE 1 TABLET BY MOUTH ONCE DAILY FOR  ACID  REFLUX    Dispense:  90 tablet    Refill:  1    Order Specific Question:   Supervising Provider    Answer:   Mikey Kirschner [[id number]]  . traMADol (ULTRAM) 50 MG tablet    Sig: Take 1 tablet (50 mg total) by mouth every 8 (eight) hours as needed. For pain    Dispense:  30 tablet    Refill:  0    Order Specific  Question:   Supervising Provider    Answer:   Maggie Font   Continue current medications. Avoid excessive NSAID use. Last EGD was 01/29/12. Consider referral back to GI if GERD persists. Encouraged exposure to regular sunlight and vitamin B complex. Reviewed recent research about potential adverse effects of PPI use. Patient to continue for now.  Return in about 4 months (around 04/11/2018).

## 2017-12-16 ENCOUNTER — Other Ambulatory Visit: Payer: Self-pay | Admitting: Nurse Practitioner

## 2017-12-16 LAB — LIPID PANEL
CHOL/HDL RATIO: 3.1 ratio (ref 0.0–4.4)
CHOLESTEROL TOTAL: 132 mg/dL (ref 100–199)
HDL: 42 mg/dL (ref >39)
LDL Calculated: 62 mg/dL (ref 0–99)
TRIGLYCERIDES: 139 mg/dL (ref 0–149)
VLDL Cholesterol Cal: 28 mg/dL (ref 5–40)

## 2017-12-16 LAB — BASIC METABOLIC PANEL
BUN/Creatinine Ratio: 17 (ref 9–23)
BUN: 12 mg/dL (ref 6–24)
CO2: 23 mmol/L (ref 20–29)
Calcium: 9.1 mg/dL (ref 8.7–10.2)
Chloride: 104 mmol/L (ref 96–106)
Creatinine, Ser: 0.72 mg/dL (ref 0.57–1.00)
GFR calc Af Amer: 111 mL/min/{1.73_m2} (ref >59)
GFR calc non Af Amer: 97 mL/min/{1.73_m2} (ref >59)
Glucose: 138 mg/dL — ABNORMAL HIGH (ref 65–99)
Potassium: 4.4 mmol/L (ref 3.5–5.2)
Sodium: 142 mmol/L (ref 134–144)

## 2017-12-16 LAB — HEPATIC FUNCTION PANEL
ALT: 25 IU/L (ref 0–32)
AST: 19 IU/L (ref 0–40)
Albumin: 4.2 g/dL (ref 3.5–5.5)
Alkaline Phosphatase: 82 IU/L (ref 39–117)
Bilirubin Total: 0.3 mg/dL (ref 0.0–1.2)
Bilirubin, Direct: 0.09 mg/dL (ref 0.00–0.40)
Total Protein: 7.2 g/dL (ref 6.0–8.5)

## 2017-12-16 LAB — VITAMIN D 25 HYDROXY (VIT D DEFICIENCY, FRACTURES): Vit D, 25-Hydroxy: 15.8 ng/mL — ABNORMAL LOW (ref 30.0–100.0)

## 2017-12-16 LAB — TSH: TSH: 2.81 u[IU]/mL (ref 0.450–4.500)

## 2017-12-16 LAB — MICROALBUMIN / CREATININE URINE RATIO
Creatinine, Urine: 103 mg/dL
Microalb/Creat Ratio: 38 mg/g creat — ABNORMAL HIGH (ref 0.0–30.0)
Microalbumin, Urine: 39.1 ug/mL

## 2017-12-16 MED ORDER — VITAMIN D (ERGOCALCIFEROL) 1.25 MG (50000 UNIT) PO CAPS: 50000.0000 [IU] | ORAL_CAPSULE | ORAL | 2 refills | Status: DC

## 2017-12-25 ENCOUNTER — Telehealth: Payer: Self-pay | Admitting: Nurse Practitioner

## 2017-12-25 ENCOUNTER — Other Ambulatory Visit: Payer: Self-pay

## 2017-12-25 DIAGNOSIS — G8911 Acute pain due to trauma: Secondary | ICD-10-CM

## 2017-12-25 NOTE — Telephone Encounter (Signed)
Pt called stating that she seen [name] on 1/24 and told her that her back was hurting. Pt states that [name] was going to refer her to a specialist. Pt called to check on this states that her back has gotten worse and that it hurts to walk. Pt also would like to know if something can be called in for pain. Please advise.

## 2017-12-25 NOTE — Telephone Encounter (Signed)
Please advise.I do not see a referral.

## 2017-12-25 NOTE — Telephone Encounter (Signed)
If back pain is no better at this time, please order MRI lumbar spine without contrast (low back pain with sciatica)

## 2017-12-25 NOTE — Telephone Encounter (Signed)
I spoke with the pt and she is claustrophobic will require sedation . She is Dm,Has Htn, No metal in body.States the Tramadol in not helping with her back pain would like something else sent in to the [facility].

## 2017-12-26 ENCOUNTER — Other Ambulatory Visit: Payer: Self-pay

## 2017-12-26 ENCOUNTER — Other Ambulatory Visit: Payer: Self-pay | Admitting: Nurse Practitioner

## 2017-12-26 DIAGNOSIS — M545 Low back pain, unspecified: Secondary | ICD-10-CM

## 2017-12-26 MED ORDER — GABAPENTIN 300 MG PO CAPS
300.0000 mg | ORAL_CAPSULE | Freq: Two times a day (BID) | ORAL | 2 refills | Status: DC
Start: 2017-12-26 — End: 2018-01-31

## 2017-12-26 MED ORDER — LORAZEPAM 1 MG PO TABS: ORAL_TABLET | ORAL | 0 refills | Status: DC

## 2017-12-26 NOTE — Telephone Encounter (Signed)
I sent in medication specifically for nerve pain coming from her back. As far as MRI: what did she do the first time? Does she mean something like Ativan for sedation or something else? Can we get open MRI approved? Thanks.

## 2017-12-26 NOTE — Telephone Encounter (Signed)
Sent in one tablet of Ativan to take as instructed before procedure.

## 2017-12-26 NOTE — Telephone Encounter (Signed)
Patient would like something to help with her nerves for the MRI

## 2017-12-27 NOTE — Telephone Encounter (Signed)
Patient is aware 

## 2017-12-30 ENCOUNTER — Telehealth: Payer: Self-pay | Admitting: Family Medicine

## 2017-12-30 ENCOUNTER — Telehealth: Payer: Self-pay | Admitting: Nurse Practitioner

## 2017-12-30 DIAGNOSIS — M545 Low back pain, unspecified: Secondary | ICD-10-CM

## 2017-12-30 DIAGNOSIS — R270 Ataxia, unspecified: Secondary | ICD-10-CM

## 2017-12-30 NOTE — Telephone Encounter (Signed)
MRI with and without contrast ordered in Epic. 

## 2017-12-30 NOTE — Telephone Encounter (Signed)
Submitted PA request for MRI L-spine with contrast through [facility] e  Pending review, await decision

## 2017-12-30 NOTE — Telephone Encounter (Signed)
Per scheduling - order must be changed to MRI L-spine with and without contrast   Please place order for MRI L-spine with & without contrast so I may schedule

## 2018-01-02 NOTE — Telephone Encounter (Addendum)
[name],  MRI L-spine with contrast came back denied.  Per denial determination: "Your doctor told us that you have low back pain.  Your doctor ordered MRI of your lower back.  An MRI is a way to take pictures of the inside of the body.  This test should be used when the pain has not improved after six weeks of treatment by your doctor.  Treatment should include medication and/or therapy that does not involved medicines.  These might include special exercises that will help make the muscles stronger.  Your doctor needs to have seen you in the office after treatment.  You also must be willing and healthy enough to have spine surgery or a shot of drugs into the spine to stop the pain.  Your doctor did not tell us that you have been treated that long for your back pain.  For this reason, this test is not medically necessary for you."  I placed determination on your desk.

## 2018-01-10 ENCOUNTER — Other Ambulatory Visit: Payer: Self-pay | Admitting: *Deleted

## 2018-01-10 DIAGNOSIS — M545 Low back pain, unspecified: Secondary | ICD-10-CM

## 2018-01-10 DIAGNOSIS — M5431 Sciatica, right side: Secondary | ICD-10-CM

## 2018-01-10 NOTE — Telephone Encounter (Signed)
Spoke with pt regarding letter about MRI. She stated that she would like to do physical therapy because "she is getting to where she can not walk on her legs and she needs help".  Pt is on Neurotin 300mg  two times a day and Tramadol 50mg  every 8 hrs prn for pain.

## 2018-01-13 ENCOUNTER — Other Ambulatory Visit: Payer: Self-pay

## 2018-01-13 ENCOUNTER — Encounter: Payer: Self-pay | Admitting: Family Medicine

## 2018-01-13 NOTE — Telephone Encounter (Signed)
Patient is aware we have placed an order in the computer for PT Bi wkly for 6 wks.

## 2018-01-13 NOTE — Telephone Encounter (Signed)
Please order physical therapy twice a week for 6 weeks per insurance requirements.

## 2018-01-30 NOTE — Progress Notes (Signed)
52 y.o. [id number] Divorced Caucasian female here for annual exam.    Reporting breast tenderness and cramping almost like she will start a period.  Pain with intercourse improved with coconut oil.  Denies vaginal bleeding.   Anaphylaxis type reaction to Flagyl last fall.  Had pelvic US done 10/17/17 which showed fibroids and possible adenomyosis.  EMS 3.55 mm.   Discovered possible lactose intolerance at that office visit.   FSH 23.5 and E2 18 on 01/18/17.  Doing lifestyle changes for Malena Edman.  ROS:  Back pain since falling last fall.  Also having neck pain.  Doing physical therapy starting today.   Labs with PCP.   PCP:   Baltazar Apo, MD  Patient's last menstrual period was 04/05/2016.           Sexually active: Yes.    The current method of family planning is post menopausal status.    Exercising: Yes.    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 Repeat pap was normal. 1990's had colposcopy with LEEP procedure to cervix. Had laser to condyloma in 1989 or 1990. MMG:  03/19/17 BIRADS 2 benign/density b --scheduled 03/25/18 Colonoscopy:  2017 per patient polyp removed; 3 small hemorrhoids. BMD:   n/a  Result  n/a TDaP:  2016 Gardasil:   n/a HIV and Hep C: 01/18/17 Negative Screening Labs:  PCP   reports that  has never smoked. she has never used smokeless tobacco. She reports that she does not drink alcohol or use drugs.  Past Medical History:  Diagnosis Date  . Anxiety   . Blood transfusion without reported diagnosis 1999   due to heavy menses  . Breast cancer ([place]) 05/22/2011   03/01/11, Stage 2, s/p lumpectomy, chemo/xrt  . DDD (degenerative disc disease), lumbar   . Depression 06/22/2011  . Diverticulitis   . DM (diabetes mellitus) ([place])  06/22/2011  . Genital warts   . GERD (gastroesophageal reflux disease) 06/22/2011  . Hx of adenomatous colonic polyps 10/2007   78m sigmoid tubular adenoma, FH colon cancer, mother in mid-50s  . Hypercholesterolemia 06/22/2011  . Hypertension 06/22/2011  . 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 05/22/2011  . Iron deficiency anemia 03/25/2015  . Morbid obesity [place]   . Shortness of breath   . 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 Vaginal bleeding 03/08/2014    Past Surgical History:  Procedure Laterality Date  . back surg x2    . CHOLECYSTECTOMY    . COLONOSCOPY  11/03/07   4-mm sessile polyp removed/small internal hemorrhoids/tubular adenoma, random colon bx negative for microscopic colitis  . COLONOSCOPY WITH PROPOFOL N/A 09/17/2016   Procedure: COLONOSCOPY WITH PROPOFOL;  Surgeon: RDaneil Dolin MD;  [facility];  Service: Endoscopy;  Laterality: N/A;  9:00 am  . COLONOSCOPY, ESOPHAGOGASTRODUODENOSCOPY (EGD) AND ESOPHAGEAL DILATION  01/2012   SLF: empiric esophageal dilation, gastritis with benign bx, hemorrhoids  . DILATATION & CURETTAGE/HYSTEROSCOPY WITH MYOSURE N/A 02/10/2015   Procedure: DILATATION & CURETTAGE/HYSTEROSCOPY WITH MYOSURE;  Surgeon: BNunzio Cobbs MD;  [facility];  Service: Gynecology;  Laterality: N/A;  . MASTECTOMY PARTIAL / LUMPECTOMY     rt.  . MM BREAST STEREO BX*L*R/S     rt.  .Marland Kitchen  POLYPECTOMY  09/17/2016   Procedure: POLYPECTOMY;  Surgeon: Daneil Dolin, MD;  [facility];  Service: Endoscopy;;  ascending colon  . PORT-A-CATH REMOVAL  05/26/2012   Procedure: REMOVAL PORT-A-CATH;  Surgeon: Jamesetta So, MD;  Location: [facility];  Service: General;  Laterality: N/A;  Minor Room  . PORTACATH PLACEMENT      Current Outpatient Medications  Medication Sig Dispense Refill  . albuterol (PROVENTIL HFA;VENTOLIN HFA) 108 (90 Base) MCG/ACT inhaler Inhale 2 puffs into the lungs every 6 (six)  hours as needed for wheezing or shortness of breath. 1 Inhaler 2  . atorvastatin (LIPITOR) 40 MG tablet Take 1 tablet (40 mg total) by mouth daily. 90 tablet 3  . blood glucose meter kit and supplies KIT Dispense based on patient and insurance preference. TEST ONCE DAILY. DIABETES TYPE 2 ICD 10 CODE E11.9 1 each 5  . citalopram (CELEXA) 40 MG tablet Take 1 tablet (40 mg total) by mouth daily. 90 tablet 1  . lisinopril (PRINIVIL,ZESTRIL) 10 MG tablet TAKE 1 TABLET BY MOUTH ONCE DAILY "DISCONTINUE LISINOPRIL/HCTZ" 90 tablet 1  . metFORMIN (GLUCOPHAGE) 500 MG tablet Take 1 tablet (500 mg total) by mouth 2 (two) times daily. 180 tablet 1  . pantoprazole (PROTONIX) 40 MG tablet TAKE 1 TABLET BY MOUTH ONCE DAILY FOR  ACID  REFLUX 90 tablet 1  . Vitamin D, Ergocalciferol, (DRISDOL) 50000 units CAPS capsule Take 1 capsule (50,000 Units total) by mouth every 7 (seven) days. 4 capsule 2  . LORazepam (ATIVAN) 1 MG tablet Take one tablet 30-60 min before MRI (Patient not taking: Reported on 01/31/2018) 1 tablet 0   No current facility-administered medications for this visit.     Family History  Problem Relation Age of Onset  . Colon cancer Mother        mid-50s, died of metastatic disease  . Cancer Mother        liver  . Coronary artery disease Mother   . Diabetes type I Mother   . Peripheral vascular disease Mother        Carotid disease in her 28s  . Coronary artery disease Father        CAD in his 46s  . Diabetes Father   . Diabetes type I Father   . Hypertension Father   . Bipolar disorder Sister   . Coronary artery disease Brother        MI in his 54s  . Hypertension Brother   . Hypertension Maternal Grandmother   . Hyperlipidemia Maternal Grandmother   . Stroke Maternal Grandmother   . Hypertension Maternal Grandfather   . Diabetes Maternal Grandfather   . Diabetes Paternal Grandmother   . Heart disease Paternal Grandfather     ROS:  Pertinent items are noted in HPI.  Otherwise, a  comprehensive ROS was negative.  Exam:   BP (!) 142/84 (BP Location: Left Arm, Patient Position: Sitting, Cuff Size: Large)   Pulse 88   Resp 16   Ht 5' 3.5" (1.613 m)   Wt 260 lb (117.9 kg)   LMP 04/05/2016 Comment: spotting 11/2016 and 12/2016  BMI 45.33 kg/m     General appearance: alert, cooperative and appears stated age Head: Normocephalic, without obvious abnormality, atraumatic Neck: no adenopathy, supple, symmetrical, trachea midline and thyroid normal to inspection and palpation Lungs: clear to auscultation bilaterally Breasts: normal appearance, no masses or tenderness, No nipple retraction or dimpling, No nipple discharge or bleeding, No axillary or supraclavicular adenopathy Heart:  regular rate and rhythm Abdomen: obese, soft, non-tender; no masses, no organomegaly Extremities: extremities normal, atraumatic, no cyanosis or edema Skin: Skin color, texture, turgor normal. No rashes or lesions Lymph nodes: Cervical, supraclavicular, and axillary nodes normal. No abnormal inguinal nodes palpated Neurologic: Grossly normal  Pelvic: External genitalia:  no lesions              Urethra:  normal appearing urethra with no masses, tenderness or lesions              Bartholins and Skenes: normal                 Vagina: normal appearing vagina with normal color and discharge, no lesions              Cervix: no lesions              Pap taken: Yes.   Bimanual Exam:  Uterus:  normal size, contour, position, consistency, mobility, non-tender              Adnexa: no mass, fullness, tenderness              Rectal exam: Yes.  .  Confirms.              Anus:  normal sphincter tone, no lesions  Chaperone was present for exam.  Assessment:   Well woman visit with normal exam. Hx right breast cancer.  Hx LGSIL.  Prior LEEP.  Hx fibroids.  Possible adenomyosis. Postmenopausal female. Amenorrhea, breast tenderness, pelvic cramping.  Vaginal atrophy.  FH of colon cancer in mother.   FH CAD.  Personal hx of CP.   Plan: Mammogram screening discussed. Recommended self breast awareness. Pap and HR HPV as above. Guidelines for Calcium, Vitamin D, regular exercise program including cardiovascular and weight bearing exercise. Vit E suppositories discussed.  FSH, estradiol.  If not menopausal, will do provera 10 mg x 10 days.  Follow up annually and prn.   After visit summary provided.

## 2018-01-31 ENCOUNTER — Ambulatory Visit: Payer: BLUE CROSS/BLUE SHIELD | Admitting: Obstetrics and Gynecology

## 2018-01-31 ENCOUNTER — Encounter (HOSPITAL_COMMUNITY): Payer: Self-pay | Admitting: Physical Therapy

## 2018-01-31 ENCOUNTER — Telehealth (HOSPITAL_COMMUNITY): Payer: Self-pay | Admitting: Physical Therapy

## 2018-01-31 ENCOUNTER — Encounter: Payer: Self-pay | Admitting: Obstetrics and Gynecology

## 2018-01-31 ENCOUNTER — Other Ambulatory Visit (HOSPITAL_COMMUNITY)
Admission: RE | Admit: 2018-01-31 | Discharge: 2018-01-31 | Disposition: A | Discharge status: Home / Self Care | Payer: BLUE CROSS/BLUE SHIELD | Source: Ambulatory Visit | Attending: Obstetrics and Gynecology | Admitting: Obstetrics and Gynecology

## 2018-01-31 ENCOUNTER — Other Ambulatory Visit: Payer: Self-pay

## 2018-01-31 ENCOUNTER — Ambulatory Visit (HOSPITAL_COMMUNITY): Payer: BLUE CROSS/BLUE SHIELD | Attending: Family Medicine | Admitting: Physical Therapy

## 2018-01-31 VITALS — BP 142/84 | HR 88 | Resp 16 | Ht 63.5 in | Wt 260.0 lb

## 2018-01-31 DIAGNOSIS — N912 Amenorrhea, unspecified: Secondary | ICD-10-CM

## 2018-01-31 DIAGNOSIS — Z01419 Encounter for gynecological examination (general) (routine) without abnormal findings: Secondary | ICD-10-CM | POA: Insufficient documentation

## 2018-01-31 DIAGNOSIS — M545 Low back pain: Secondary | ICD-10-CM | POA: Diagnosis present

## 2018-01-31 DIAGNOSIS — N644 Mastodynia: Secondary | ICD-10-CM | POA: Diagnosis not present

## 2018-01-31 DIAGNOSIS — G8929 Other chronic pain: Secondary | ICD-10-CM | POA: Insufficient documentation

## 2018-01-31 DIAGNOSIS — M6281 Muscle weakness (generalized): Secondary | ICD-10-CM | POA: Insufficient documentation

## 2018-01-31 DIAGNOSIS — R102 Pelvic and perineal pain unspecified side: Secondary | ICD-10-CM

## 2018-01-31 DIAGNOSIS — R2689 Other abnormalities of gait and mobility: Secondary | ICD-10-CM | POA: Diagnosis not present

## 2018-01-31 NOTE — Patient Instructions (Signed)

## 2018-01-31 NOTE — Addendum Note (Signed)
Addended by: Clarene Critchley on: 01/31/2018 06:13 PM   Modules accepted: Orders

## 2018-01-31 NOTE — Therapy (Signed)
[place], [place], [address] Phone: [phone]   Fax:  [phone]  Physical Therapy Evaluation  Patient Details  Name: [name] MRN: [id number] Date of Birth: 1966/01/26 Referring Provider: Mikey Kirschner, MD   Encounter Date: 01/31/2018  PT End of Session - 01/31/18 1622    Visit Number  1    Number of Visits  13    Date for PT Re-Evaluation  03/14/18 Mini re-assess: 02/21/18    Authorization Type  [employer] Other     Authorization Time Period  01/31/18- 03/14/18    PT Start Time  1347    PT Stop Time  1440    PT Time Calculation (min)  53 min    Equipment Utilized During Treatment  Gait belt    Activity Tolerance  Patient tolerated treatment well;Patient limited by pain    Behavior During Therapy  [facility] for tasks assessed/performed       Past Medical History:  Diagnosis Date  . Anxiety   . Blood transfusion without reported diagnosis 1999   due to heavy menses  . Breast cancer ([place]) 05/22/2011   03/01/11, Stage 2, s/p lumpectomy, chemo/xrt  . DDD (degenerative disc disease), lumbar   . Depression 06/22/2011  . Diverticulitis   . DM (diabetes mellitus) ([place]) 06/22/2011  . Genital warts   . GERD (gastroesophageal reflux disease) 06/22/2011  . Hx of adenomatous colonic polyps 10/2007   29mm sigmoid tubular adenoma, FH colon cancer, mother in mid-50s  . Hypercholesterolemia 06/22/2011  . Hypertension 06/22/2011  . 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[place]) 05/22/2011  . Iron deficiency anemia 03/25/2015  . Morbid obesity ([place])   . Shortness of breath   . 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 Vaginal bleeding 03/08/2014    Past Surgical History:  Procedure Laterality Date  . back surg x2    . CHOLECYSTECTOMY    . COLONOSCOPY  11/03/07   4-mm sessile polyp removed/small internal hemorrhoids/tubular adenoma, random colon bx negative for microscopic colitis  .  COLONOSCOPY WITH PROPOFOL N/A 09/17/2016   Procedure: COLONOSCOPY WITH PROPOFOL;  Surgeon: Daneil Dolin, MD;  [facility];  Service: Endoscopy;  Laterality: N/A;  9:00 am  . COLONOSCOPY, ESOPHAGOGASTRODUODENOSCOPY (EGD) AND ESOPHAGEAL DILATION  01/2012   SLF: empiric esophageal dilation, gastritis with benign bx, hemorrhoids  . DILATATION & CURETTAGE/HYSTEROSCOPY WITH MYOSURE N/A 02/10/2015   Procedure: DILATATION & CURETTAGE/HYSTEROSCOPY WITH MYOSURE;  Surgeon: Nunzio Cobbs, MD;  Location: [facility];  Service: Gynecology;  Laterality: N/A;  . MASTECTOMY PARTIAL / LUMPECTOMY     rt.  . MM BREAST STEREO BX*L*R/S     rt.  Marland Kitchen POLYPECTOMY  09/17/2016   Procedure: POLYPECTOMY;  Surgeon: Daneil Dolin, MD;  Location: [facility];  Service: Endoscopy;;  ascending colon  . PORT-A-CATH REMOVAL  05/26/2012   Procedure: REMOVAL PORT-A-CATH;  Surgeon: Jamesetta So, MD;  Location: [facility];  Service: General;  Laterality: N/A;  Minor Room  . PORTACATH PLACEMENT      There were no vitals filed for this visit.   Subjective Assessment - 01/31/18 1406    Subjective  Patient reported that in May 2002 and August 2002 she had back surgery, but she is unable to provide further details about the surgeries. Following the first surgery she reported having leg and back pain. Patient stated at one point she was  having such bad pain in left leg she felt she couldn't walk had to call 911, then she had the second surgery to address this issue. Patient reported that in 2018 there was a hole in the ground which patient said she fell into the hole and landed with her legs going opposite ways. Patient denied hitting her back during this fall. Patient stated that sometimes her legs give way. Patient stated her feet go numb when she is standing for a long time. Patient stated that she has not yet had an MRI. Patient denied any tingling or numbness currently. She first got numbness in right leg, but now she  gets it in her left leg also. Numbness is worse in the right than the left. Patient stated her pain in her lower back is sharp pain straight across lower back into lower extremities. Patient reported that she hasn't had any bowel and bladder changes since having back pain. Patient stated she has had some difficulties with her balance as well. Patient denied any saddle anesthesia. Patient denied any fever.  When she lays flat she feels like she can't sit up again.     Pertinent History  History of 2 back surgeries in 2002    Limitations  Walking;Sitting;Standing;Lifting;House hold activities    How long can you sit comfortably?  5 minutes    How long can you stand comfortably?  15-20 minutes    How long can you walk comfortably?  10 minutes    Diagnostic tests  Patient stated she has not had any recently. MRI Lumbar 04/01/01: "Large posterior disc herniation at L5-S1, with bilateral compression of the S1 roots left greater than right."    Patient Stated Goals  Be able to get around better and live a more normal life     Currently in Pain?  Yes    Pain Score  7     Pain Location  Back    Pain Orientation  Lower    Pain Descriptors / Indicators  Sharp    Pain Type  Chronic pain    Pain Radiating Towards  Radiates down both legs; both legs can have numbness although not currently    Pain Onset  More than a month ago    Pain Frequency  Constant    Aggravating Factors   Standing for a while;    Pain Relieving Factors  Not much, sitting for a minute     Multiple Pain Sites  -- See above         [facility] PT Assessment - 01/31/18 0001      Assessment   Medical Diagnosis  Ataxia    Referring Provider  Mikey Kirschner, MD    Onset Date/Surgical Date  -- 2002    Prior Therapy  No      Balance Screen   Has the patient fallen in the past 6 months  No    Has the patient had a decrease in activity level because of a fear of falling?   Yes    Is the patient reluctant to leave their home because of a  fear of falling?   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      Cognition   Overall Cognitive Status  Within Functional Limits for tasks assessed  Observation/Other Assessments   Observations  Patient moves without much flexion of legs and lack of movement in trunk    Focus on Therapeutic Outcomes (FOTO)   12% (88% limited)    Activities of Balance Confidence Scale (ABC Scale)   Total 330 or 79.375% limited      Sensation   Light Touch  Impaired by gross assessment    Additional Comments  Patient reported all lower extremity dermatomes felt normal, except left lower extremity lateral foot and 5th toe which she stated felt different since her surgery       Coordination   Heel Shin Test  Fluid movement bilaterally      AROM   Overall AROM Comments  Painful with all lumbar AROM    Lumbar Flexion  Moderately limited.    Lumbar Extension  Severely limited    Lumbar - Right Side Bend  Severely limited    Lumbar - Left Side Bend  Severely limited    Lumbar - Right Rotation  Severely limited    Lumbar - Left Rotation  Severely limited      Strength   Right/Left Hip  Right;Left    Right Hip Flexion  4/5 Painful    Right Hip Extension  4/5 Painful    Right Hip ABduction  4/5 Painful    Right Hip ADduction  4/5 Painful    Left Hip Flexion  4/5 Painful    Left Hip Extension  4/5 Painful    Left Hip ABduction  4/5 Painful    Right/Left Knee  Right;Left    Right Knee Flexion  4+/5 Painful    Right Knee Extension  4+/5 Painful    Left Knee Flexion  4+/5 Painful    Left Knee Extension  4+/5 Painful    Right/Left Ankle  Right;Left    Right Ankle Dorsiflexion  5/5    Right Ankle Plantar Flexion  2+/5    Left Ankle Dorsiflexion  5/5    Left Ankle Plantar Flexion  2+/5      Palpation   Palpation comment  Patient was very  tender to touch all along spine. Posterior to anterior spinal joint assessment difficult due to patient reporting pain anytime therapist applied pressure to spine.       Slump test   Findings  Negative    Comment  Negative bilaterally      Ambulation/Gait   Gait Comments  With ambulation around clinic noted short shuffling steps      Static Standing Balance   Static Standing - Balance Support  No upper extremity supported    Static Standing - Level of Assistance  7: Independent    Static Standing Balance -  Activities   Single Leg Stance - Right Leg;Single Leg Stance - Left Leg    Static Standing - Comment/# of Minutes  SLS Rt.: 3 seconds SLS Lt.: 4 seconds      Timed Up and Go Test   TUG  Normal TUG    Normal TUG (seconds)  17.57    TUG Comments  Ambulates with short shuffling steps      Functional Gait  Assessment   Gait Level Surface  Walks 20 ft, slow speed, abnormal gait pattern, evidence for imbalance or deviates 10-15 in outside of the 12 in walkway width. Requires more than 7 sec to ambulate 20 ft.    Change in Gait Speed  Able to smoothly change walking speed without loss of balance or gait deviation. Deviate no more  than 6 in outside of the 12 in walkway width.    Gait with Horizontal Head Turns  Performs head turns smoothly with slight change in gait velocity (eg, minor disruption to smooth gait path), deviates 6-10 in outside 12 in walkway width, or uses an assistive device.    Gait with Vertical Head Turns  Performs task with slight change in gait velocity (eg, minor disruption to smooth gait path), deviates 6 - 10 in outside 12 in walkway width or uses assistive device    Gait and Pivot Turn  Pivot turns safely in greater than 3 sec and stops with no loss of balance, or pivot turns safely within 3 sec and stops with mild imbalance, requires small steps to catch balance.    Step Over Obstacle  Is able to step over one shoe box (4.5 in total height) but must slow down and adjust  steps to clear box safely. May require verbal cueing.    Gait with Narrow Base of Support  Ambulates 7-9 steps.    Gait with Eyes Closed  Walks 20 ft, slow speed, abnormal gait pattern, evidence for imbalance, deviates 10-15 in outside 12 in walkway width. Requires more than 9 sec to ambulate 20 ft.    Ambulating Backwards  Walks 20 ft, slow speed, abnormal gait pattern, evidence for imbalance, deviates 10-15 in outside 12 in walkway width.    Steps  Two feet to a stair, must use rail.    Total Score  16      RLE Tone   RLE Tone  Within Functional Limits Patellar and achilles reflex: 1+; no clonus      LLE Tone   LLE Tone  Within Functional Limits Patellar and achilles reflex: 1+; no clonus             Objective measurements completed on examination: See above findings.              PT Education - 01/31/18 1621    Education provided  Yes    Education Details  Patient was educated on examination findings and plan of care.     Person(s) Educated  Patient    Methods  Explanation    Comprehension  Verbalized understanding       PT Short Term Goals - 01/31/18 1644      PT SHORT TERM GOAL #1   Title  Patient will demonstrate understanding and report regular compliance with home exercise program to improve strength and balance.     Time  3    Period  Weeks    Status  New    Target Date  02/21/18      PT SHORT TERM GOAL #2   Title  Patient will demonstrate improvement of 1/2 MMT strength grade in all deficient hip and knee musculature to improve patient's performance with gait and stair ambulation.     Time  3    Period  Weeks    Status  New    Target Date  02/21/18      PT SHORT TERM GOAL #3   Title  Patient will demonstrate improvement on Functional Gait Assessment of 4 points indicating improvement with balance during ambulation and decreased fall risk.     Time  3    Period  Weeks    Status  New    Target Date  02/21/18      PT SHORT TERM GOAL #4   Title   Patient will report no greater than  a 5/10 pain over the course of a 1 week period indicating better tolerance to functional activities.     Time  3    Period  Weeks    Status  New    Target Date  02/21/18        PT Long Term Goals - 01/31/18 1651      PT LONG TERM GOAL #1   Title  Patient will demonstrate improvement of 1 MMT strength grade in all defcient tested hip and knee musculature as evidence of improved strength to assist with ambulation and stair ambulation.     Time  6    Period  Weeks    Status  New    Target Date  03/14/18      PT LONG TERM GOAL #2   Title  Patient will demonstrate improvement of 20 percent on the ABC scale indicating improved patient confidence with balance activities.     Time  6    Period  Weeks    Status  New    Target Date  03/14/18      PT LONG TERM GOAL #3   Title  Patient will perform TUG in 13.5 seconds or less indicating improved balance and decreased risk of falls.     Time  6    Period  Weeks    Status  New    Target Date  03/14/18      PT LONG TERM GOAL #4   Title  Patient will report no greater than a 2/10 pain over the course of a 1 week period indicating improved tolerance to daily activities.     Time  6    Period  Weeks    Status  New    Target Date  03/14/18      PT LONG TERM GOAL #5   Title  Patient will demonstrate improvement on Functional Gait Assessment of 9 points indicating improvement with balance during ambulation and low fall risk.     Time  6    Period  Weeks    Status  New    Target Date  03/14/18             Plan - 01/31/18 1740    Clinical Impression Statement  Patient is a 52 year old female who presented to physical therapy for evaluation today with complaints of difficulty with balance and back pain. Upon examination, patient demonstrated decreased lower extremity strength and decreased lumbar ROM. Upon palpation, patient reported pain throughout palpation and a formal assessment with posterior  to anterior spinal mobility was difficult due to assess due to patient's reported pain level. Patient demonstrated poor static balance with single limb stance, poor dynamic balance with the Functional Gait Assessment and Timed Up and Go. Patient scored at 20.625% on the Activities-specific Balance Confidence Scale. Patient reported pain at time of evaluation and pain that can reach a maximum of 10/10. Patient would benefit from skilled physical therapy in order to address the abovementioned deficits and improve patient's overall functional mobility.     History and Personal Factors relevant to plan of care:  2 back surgeries (First one being a Left L5-S1 Lumbar Laminotomy from chart review); HTN; Diabetes; History of breast cancer    Clinical Presentation  Stable    Clinical Presentation due to:  MMT; ROM; FOTO; ABC Scale; FGA; TUG; Clinical judgement    Clinical Decision Making  Moderate    Rehab Potential  Fair    Clinical Impairments Affecting  Rehab Potential  Positive: Motivated. Negative: Chronicity of problem, severity of pain, comorbidities    PT Frequency  2x / week    PT Duration  6 weeks    PT Treatment/Interventions  ADLs/Self Care Home Management;Aquatic Therapy;Cryotherapy;DME Instruction;Gait training;Stair training;Functional mobility training;Therapeutic activities;Therapeutic exercise;Balance training;Neuromuscular re-education;Patient/family education;Manual techniques;Passive range of motion;Dry needling;Energy conservation;Taping    PT Next Visit Plan  Review evaluation and goals; Initiate HEP: single leg stance balance, tandem balance, lumbar stabilization; Initiate supine lumbar stabilization exercises and balance exercises    PT Home Exercise Plan  Initiate first treatment session    Consulted and Agree with Plan of Care  Patient       Patient will benefit from skilled therapeutic intervention in order to improve the following deficits and impairments:  Abnormal gait, Decreased  balance, Decreased endurance, Decreased mobility, Difficulty walking, Hypomobility, Impaired sensation, Decreased range of motion, Improper body mechanics, Obesity, Decreased activity tolerance, Decreased strength, Pain  Visit Diagnosis: Other abnormalities of gait and mobility  Chronic bilateral low back pain, with sciatica presence unspecified  Muscle weakness (generalized)     Problem List Patient Active Problem List   Diagnosis Date Noted  . Seasonal affective disorder ([place]) 12/15/2017  . Chest pain 02/27/2017  . Rectal bleeding 09/06/2016  . Abdominal cramping 09/06/2016  . Fatty liver 09/06/2016  . Dehydration 12/15/2015  . Orthostatic dizziness 12/15/2015  . Nausea vomiting and diarrhea 12/15/2015  . Iron deficiency anemia 03/25/2015  . Excessive or frequent menstruation 04/08/2014  . Dysmenorrhea 04/08/2014  . H/O adenomatous polyp of colon 01/24/2012  . FH: colon cancer 01/24/2012  . Esophageal dysphagia 01/24/2012  . Chronic diarrhea 01/24/2012  . Depression 06/22/2011  . GERD (gastroesophageal reflux disease) 06/22/2011  . DM (diabetes mellitus) ([place]) 06/22/2011  . Hypercholesterolemia 06/22/2011  . Hypertension 06/22/2011  . Obesity 06/22/2011  . 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[place]) 05/22/2011   Clarene Critchley PT, DPT 5:49 PM, 01/31/18 [place] [address] [place], [place], [address] Phone: [phone]   Fax:  [phone]  Name: [name] MRN: [id number] Date of Birth: [birth year]

## 2018-01-31 NOTE — Telephone Encounter (Signed)
Therapist called patient to explain that she had accidentally routed a note and sent a certification to a physician with the same last name as her referring physician, but who is not her referring physician. Patient stated that it was no problem at all and that they are brothers and both know all about her situation. Therapist assured her that she would call the physician's office and have them shred the information. Patient stated that it was okay and thanked therapist for calling. Call completed around 6:15 pm.   Clarene Critchley PT, DPT 6:18 PM, 01/31/18 [phone]

## 2018-02-01 LAB — ESTRADIOL: Estradiol: 20.5 pg/mL

## 2018-02-01 LAB — FOLLICLE STIMULATING HORMONE: FSH: 23.6 m[IU]/mL

## 2018-02-03 ENCOUNTER — Ambulatory Visit (HOSPITAL_COMMUNITY): Payer: BLUE CROSS/BLUE SHIELD

## 2018-02-03 ENCOUNTER — Encounter (HOSPITAL_COMMUNITY): Payer: Self-pay

## 2018-02-03 DIAGNOSIS — M6281 Muscle weakness (generalized): Secondary | ICD-10-CM

## 2018-02-03 DIAGNOSIS — G8929 Other chronic pain: Secondary | ICD-10-CM

## 2018-02-03 DIAGNOSIS — R2689 Other abnormalities of gait and mobility: Secondary | ICD-10-CM

## 2018-02-03 DIAGNOSIS — M545 Low back pain: Secondary | ICD-10-CM

## 2018-02-03 NOTE — Therapy (Signed)
[place], [place], [address] Phone: [phone]   Fax:  [phone]  Physical Therapy Treatment  Patient Details  Name: [name] MRN: [id number] Date of Birth: 02/02/1966 Referring Provider: Mikey Kirschner, MD   Encounter Date: 02/03/2018  PT End of Session - 02/03/18 1224    Visit Number  2    Number of Visits  13    Date for PT Re-Evaluation  03/14/18 Mini re-assess: 02/21/18    Authorization Type  BCBS Other     Authorization Time Period  01/31/18- 03/14/18    PT Start Time  1117    PT Stop Time  1200    PT Time Calculation (min)  43 min    Activity Tolerance  Patient tolerated treatment well;Patient limited by pain    Behavior During Therapy  [facility] for tasks assessed/performed       Past Medical History:  Diagnosis Date  . Anxiety   . Blood transfusion without reported diagnosis 1999   due to heavy menses  . Breast cancer ([place]) 05/22/2011   03/01/11, Stage 2, s/p lumpectomy, chemo/xrt  . DDD (degenerative disc disease), lumbar   . Depression 06/22/2011  . Diverticulitis   . DM (diabetes mellitus) ([place]) 06/22/2011  . Genital warts   . GERD (gastroesophageal reflux disease) 06/22/2011  . Hx of adenomatous colonic polyps 10/2007   77mm sigmoid tubular adenoma, FH colon cancer, mother in mid-50s  . Hypercholesterolemia 06/22/2011  . Hypertension 06/22/2011  . 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[place]) 05/22/2011  . Iron deficiency anemia 03/25/2015  . Morbid obesity ([place])   . Shortness of breath   . 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 Vaginal bleeding 03/08/2014    Past Surgical History:  Procedure Laterality Date  . back surg x2    . CHOLECYSTECTOMY    . COLONOSCOPY  11/03/07   4-mm sessile polyp removed/small internal hemorrhoids/tubular adenoma, random colon bx negative for microscopic colitis  . COLONOSCOPY WITH PROPOFOL N/A 09/17/2016   Procedure:  COLONOSCOPY WITH PROPOFOL;  Surgeon: Daneil Dolin, MD;  [facility];  Service: Endoscopy;  Laterality: N/A;  9:00 am  . COLONOSCOPY, ESOPHAGOGASTRODUODENOSCOPY (EGD) AND ESOPHAGEAL DILATION  01/2012   SLF: empiric esophageal dilation, gastritis with benign bx, hemorrhoids  . DILATATION & CURETTAGE/HYSTEROSCOPY WITH MYOSURE N/A 02/10/2015   Procedure: DILATATION & CURETTAGE/HYSTEROSCOPY WITH MYOSURE;  Surgeon: Nunzio Cobbs, MD;  Location: [facility];  Service: Gynecology;  Laterality: N/A;  . MASTECTOMY PARTIAL / LUMPECTOMY     rt.  . MM BREAST STEREO BX*L*R/S     rt.  Marland Kitchen POLYPECTOMY  09/17/2016   Procedure: POLYPECTOMY;  Surgeon: Daneil Dolin, MD;  [facility];  Service: Endoscopy;;  ascending colon  . PORT-A-CATH REMOVAL  05/26/2012   Procedure: REMOVAL PORT-A-CATH;  Surgeon: Jamesetta So, MD;  Location: [facility];  Service: General;  Laterality: N/A;  Minor Room  . PORTACATH PLACEMENT      There were no vitals filed for this visit.  Subjective Assessment - 02/03/18 1138    Subjective  Patient reoprts low back and leg pain today. No change in symptoms so far. Legs get to feeling stiff.    Pertinent History  History of 2 back surgeries in 2002    Limitations  Walking;Sitting;Standing;Lifting;House hold activities    How long can you sit comfortably?  5 minutes  How long can you stand comfortably?  15-20 minutes    How long can you walk comfortably?  10 minutes    Diagnostic tests  Patient stated she has not had any recently. MRI Lumbar 04/01/01: "Large posterior disc herniation at L5-S1, with bilateral compression of the S1 roots left greater than right."    Patient Stated Goals  Be able to get around better and live a more normal life     Currently in Pain?  Yes    Pain Score  8     Pain Location  Leg    Pain Orientation  Right;Left    Pain Descriptors / Indicators  Tightness    Pain Type  Chronic pain    Multiple Pain Sites  Yes    Pain Score  8     Pain Location  Back    Pain Orientation  Lower    Pain Descriptors / Indicators  Sharp    Pain Type  Chronic pain                      [facility] Adult PT Treatment/Exercise - 02/03/18 0001      Bed Mobility   Bed Mobility  Rolling Left;Supine to Sit;Sit to Supine log roll      Lumbar Exercises: Seated   Other Seated Lumbar Exercises  marching painful      Lumbar Exercises: Supine   Ab Set  10 reps;5 seconds    Pelvic Tilt  10 reps;5 seconds    Bent Knee Raise  10 reps;5 seconds          Balance Exercises - 02/03/18 1214      Balance Exercises: Standing   Tandem Stance  Eyes open;1 rep;30 secs    SLS  Eyes open;Solid surface;1 rep;30 secs Lt and Rt        PT Education - 02/03/18 1224    Education provided  Yes    Education Details  HEP, body mechanics, posture, pain, clinical reasoning for exs    Person(s) Educated  Patient    Methods  Explanation;Handout    Comprehension  Verbalized understanding;Need further instruction       PT Short Term Goals - 01/31/18 1644      PT SHORT TERM GOAL #1   Title  Patient will demonstrate understanding and report regular compliance with home exercise program to improve strength and balance.     Time  3    Period  Weeks    Status  New    Target Date  02/21/18      PT SHORT TERM GOAL #2   Title  Patient will demonstrate improvement of 1/2 MMT strength grade in all deficient hip and knee musculature to improve patient's performance with gait and stair ambulation.     Time  3    Period  Weeks    Status  New    Target Date  02/21/18      PT SHORT TERM GOAL #3   Title  Patient will demonstrate improvement on Functional Gait Assessment of 4 points indicating improvement with balance during ambulation and decreased fall risk.     Time  3    Period  Weeks    Status  New    Target Date  02/21/18      PT SHORT TERM GOAL #4   Title  Patient will report no greater than a 5/10 pain over the course of a 1 week period  indicating better tolerance  to functional activities.     Time  3    Period  Weeks    Status  New    Target Date  02/21/18        PT Long Term Goals - 01/31/18 1651      PT LONG TERM GOAL #1   Title  Patient will demonstrate improvement of 1 MMT strength grade in all defcient tested hip and knee musculature as evidence of improved strength to assist with ambulation and stair ambulation.     Time  6    Period  Weeks    Status  New    Target Date  03/14/18      PT LONG TERM GOAL #2   Title  Patient will demonstrate improvement of 20 percent on the ABC scale indicating improved patient confidence with balance activities.     Time  6    Period  Weeks    Status  New    Target Date  03/14/18      PT LONG TERM GOAL #3   Title  Patient will perform TUG in 13.5 seconds or less indicating improved balance and decreased risk of falls.     Time  6    Period  Weeks    Status  New    Target Date  03/14/18      PT LONG TERM GOAL #4   Title  Patient will report no greater than a 2/10 pain over the course of a 1 week period indicating improved tolerance to daily activities.     Time  6    Period  Weeks    Status  New    Target Date  03/14/18      PT LONG TERM GOAL #5   Title  Patient will demonstrate improvement on Functional Gait Assessment of 9 points indicating improvement with balance during ambulation and low fall risk.     Time  6    Period  Weeks    Status  New    Target Date  03/14/18            Plan - 02/03/18 1225    Clinical Impression Statement  Patient overall tolerated treatment well with decreased pain to 6/10 at EOS. Body mechanics and lumbar stabilization education will be paramount. Patient continues to require skilled PT to advance HEP, diminish pain, improve posture, strength and balancer and improve functional abilities.    Rehab Potential  Fair    Clinical Impairments Affecting Rehab Potential  Positive: Motivated. Negative: Chronicity of problem, severity  of pain, comorbidities    PT Frequency  2x / week    PT Duration  6 weeks    PT Treatment/Interventions  ADLs/Self Care Home Management;Aquatic Therapy;Cryotherapy;DME Instruction;Gait training;Stair training;Functional mobility training;Therapeutic activities;Therapeutic exercise;Balance training;Neuromuscular re-education;Patient/family education;Manual techniques;Passive range of motion;Dry needling;Energy conservation;Taping    PT Next Visit Plan  review HEP, add when able single/double knee to chest, calf and hamstring stretches, side-lying clams, supine bridge, hip abd/ext strengthening    PT Home Exercise Plan  02/03/2018 - ab set, pelvic tilt, supine march, SLS and tandem balance    Consulted and Agree with Plan of Care  Patient       Patient will benefit from skilled therapeutic intervention in order to improve the following deficits and impairments:  Abnormal gait, Decreased balance, Decreased endurance, Decreased mobility, Difficulty walking, Hypomobility, Impaired sensation, Decreased range of motion, Improper body mechanics, Obesity, Decreased activity tolerance, Decreased strength, Pain  Visit Diagnosis: Other abnormalities  of gait and mobility  Chronic bilateral low back pain, with sciatica presence unspecified  Muscle weakness (generalized)     Problem List Patient Active Problem List   Diagnosis Date Noted  . Seasonal affective disorder ([place]) 12/15/2017  . Chest pain 02/27/2017  . Rectal bleeding 09/06/2016  . Abdominal cramping 09/06/2016  . Fatty liver 09/06/2016  . Dehydration 12/15/2015  . Orthostatic dizziness 12/15/2015  . Nausea vomiting and diarrhea 12/15/2015  . Iron deficiency anemia 03/25/2015  . Excessive or frequent menstruation 04/08/2014  . Dysmenorrhea 04/08/2014  . H/O adenomatous polyp of colon 01/24/2012  . FH: colon cancer 01/24/2012  . Esophageal dysphagia 01/24/2012  . Chronic diarrhea 01/24/2012  . Depression 06/22/2011  . GERD  (gastroesophageal reflux disease) 06/22/2011  . DM (diabetes mellitus) ([place]) 06/22/2011  . Hypercholesterolemia 06/22/2011  . Hypertension 06/22/2011  . Obesity 06/22/2011  . 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[place]) 05/22/2011    Floria Raveling. Hartnett-Rands, MS, PT [facility] #[id number] 02/03/2018, 12:29 PM  [facility] [address] [place], [place], [address] Phone: [phone]   Fax:  [phone]  Name: [name] MRN: [id number] Date of Birth: 12-03-1965

## 2018-02-03 NOTE — Patient Instructions (Addendum)
Stabilization: Transverse Abdominus Contraction - Supine    Lie with knees bent, feet flat. Place fingers on abdominal muscles just inside pelvis. Contract abdominals, pulling naval toward spine. Feel muscle contract, but keep pelvis and back still. Hold 10 seconds. Repeat 10____ times per set. Do _1___ sets per session. Do _2___ sessions per day.  Copyright  VHI. All rights reserved.  Abdominals: Single Leg Bend    Lying on back with legs out straight, inhale, then exhale while slowly sliding heel along floor toward buttocks. Slowly return to starting position. Repeat _10___ times each leg per set. Do __1__ sets per session. Do __2__ sessions per day.  Copyright  VHI. All rights reserved.  Balance: Eyes Closed - Unilateral (Varied Surfaces)    Stand on left foot, close eyes. Maintain balance _30-60___ seconds. Repeat _1__ times per set. Do _2___ sessions per day. Repeat on compliant surface: foam.  Copyright  VHI. All rights reserved.  Pelvic Tilt: Anterior - Legs Bent (Supine)    Rotate pelvis up and arch back. Hold 5____ seconds. Relax. Repeat __10__ times per set. Do _1___ sets per session. Do _2___ sessions per day.  http://orth.exer.us/213   Copyright  VHI. All rights reserved.  Log Roll    Lying on back, bend left knee and place left arm across chest. Roll all in one movement to the right. Reverse to roll to the left. Always move as one unit.   Copyright  VHI. All rights reserved.

## 2018-02-04 LAB — CYTOLOGY - PAP
Diagnosis: NEGATIVE
HPV: NOT DETECTED

## 2018-02-05 ENCOUNTER — Encounter (HOSPITAL_COMMUNITY): Payer: Self-pay

## 2018-02-05 ENCOUNTER — Ambulatory Visit (HOSPITAL_COMMUNITY): Payer: BLUE CROSS/BLUE SHIELD

## 2018-02-05 DIAGNOSIS — G8929 Other chronic pain: Secondary | ICD-10-CM

## 2018-02-05 DIAGNOSIS — R2689 Other abnormalities of gait and mobility: Secondary | ICD-10-CM | POA: Diagnosis not present

## 2018-02-05 DIAGNOSIS — M6281 Muscle weakness (generalized): Secondary | ICD-10-CM

## 2018-02-05 DIAGNOSIS — M545 Low back pain: Secondary | ICD-10-CM

## 2018-02-05 NOTE — Therapy (Signed)
[facility] [place], [place], [address] Phone: [phone]   Fax:  [phone]  Physical Therapy Treatment  Patient Details  Name: [name] MRN: [id number] Date of Birth: 12-15-1965 Referring Provider: Mikey Kirschner, MD   Encounter Date: 02/05/2018  PT End of Session - 02/05/18 1136    Visit Number  3    Number of Visits  12    Date for PT Re-Evaluation  03/14/18 minireassess 02/21/18    Authorization Type  BCBS Other     Authorization Time Period  01/31/18- 03/14/18    PT Start Time  1118    PT Stop Time  1159    PT Time Calculation (min)  41 min    Activity Tolerance  Patient tolerated treatment well;Patient limited by pain;No increased pain    Behavior During Therapy  WFL for tasks assessed/performed       Past Medical History:  Diagnosis Date  . Anxiety   . Blood transfusion without reported diagnosis 1999   due to heavy menses  . Breast cancer ([place]) 05/22/2011   03/01/11, Stage 2, s/p lumpectomy, chemo/xrt  . DDD (degenerative disc disease), lumbar   . Depression 06/22/2011  . Diverticulitis   . DM (diabetes mellitus) ([place]) 06/22/2011  . Genital warts   . GERD (gastroesophageal reflux disease) 06/22/2011  . Hx of adenomatous colonic polyps 10/2007   33mm sigmoid tubular adenoma, FH colon cancer, mother in mid-50s  . Hypercholesterolemia 06/22/2011  . Hypertension 06/22/2011  . 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[place]) 05/22/2011  . Iron deficiency anemia 03/25/2015  . Morbid obesity ([place])   . Shortness of breath   . 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 Vaginal bleeding 03/08/2014    Past Surgical History:  Procedure Laterality Date  . back surg x2    . CHOLECYSTECTOMY    . COLONOSCOPY  11/03/07   4-mm sessile polyp removed/small internal hemorrhoids/tubular adenoma, random colon bx negative for microscopic colitis  . COLONOSCOPY WITH PROPOFOL N/A 09/17/2016   Procedure: COLONOSCOPY WITH PROPOFOL;  Surgeon: Daneil Dolin, MD;  [facility];  Service: Endoscopy;  Laterality: N/A;  9:00 am  . COLONOSCOPY, ESOPHAGOGASTRODUODENOSCOPY (EGD) AND ESOPHAGEAL DILATION  01/2012   SLF: empiric esophageal dilation, gastritis with benign bx, hemorrhoids  . DILATATION & CURETTAGE/HYSTEROSCOPY WITH MYOSURE N/A 02/10/2015   Procedure: DILATATION & CURETTAGE/HYSTEROSCOPY WITH MYOSURE;  Surgeon: Nunzio Cobbs, MD;  Location: [facility];  Service: Gynecology;  Laterality: N/A;  . MASTECTOMY PARTIAL / LUMPECTOMY     rt.  . MM BREAST STEREO BX*L*R/S     rt.  Marland Kitchen POLYPECTOMY  09/17/2016   Procedure: POLYPECTOMY;  Surgeon: Daneil Dolin, MD;  [facility];  Service: Endoscopy;;  ascending colon  . PORT-A-CATH REMOVAL  05/26/2012   Procedure: REMOVAL PORT-A-CATH;  Surgeon: Jamesetta So, MD;  Location: [facility];  Service: General;  Laterality: N/A;  Minor Room  . PORTACATH PLACEMENT      There were no vitals filed for this visit.  Subjective Assessment - 02/05/18 1122    Subjective  Pt stated her Lt knee continues to pop scaring her like she is going to fall.  No reports of recent falls.  Current pain scale 7/10 LBP stiff, sore and constant sharp pain.  Reports most pain in knee and lower back with forward bending during work.  Pertinent History  History of 2 back surgeries in 2002    Patient Stated Goals  Be able to get around better and live a more normal life     Currently in Pain?  Yes    Pain Score  7     Pain Location  Back LBP and Lt knee popping/pain    Pain Orientation  Right;Left;Lower    Pain Descriptors / Indicators  Aching;Sore;Sharp    Pain Type  Chronic pain    Pain Radiating Towards  Radiated down both legs wiht weight bearing    Pain Onset  More than a month ago    Pain Frequency  Constant    Aggravating Factors   standing for a while    Pain Relieving Factors  Not much, sitting for a minute    Effect of Pain on Daily  Activities  severly                      [facility] Adult PT Treatment/Exercise - 02/05/18 0001      Bed Mobility   Bed Mobility  Sit to Sidelying Left Able to demonstrate appropriate mechanics without cueing req      Lumbar Exercises: Stretches   Single Knee to Chest Stretch  2 reps;30 seconds towel assistance      Lumbar Exercises: Supine   Ab Set  10 reps;5 seconds    Pelvic Tilt  10 reps;5 seconds    Clam  5 reps;5 seconds RTB    Bent Knee Raise  10 reps;5 seconds    Bridge  10 reps      Lumbar Exercises: Sidelying   Clam  Both;10 reps;5 seconds RTB    Hip Abduction  10 reps               PT Short Term Goals - 01/31/18 1644      PT SHORT TERM GOAL #1   Title  Patient will demonstrate understanding and report regular compliance with home exercise program to improve strength and balance.     Time  3    Period  Weeks    Status  New    Target Date  02/21/18      PT SHORT TERM GOAL #2   Title  Patient will demonstrate improvement of 1/2 MMT strength grade in all deficient hip and knee musculature to improve patient's performance with gait and stair ambulation.     Time  3    Period  Weeks    Status  New    Target Date  02/21/18      PT SHORT TERM GOAL #3   Title  Patient will demonstrate improvement on Functional Gait Assessment of 4 points indicating improvement with balance during ambulation and decreased fall risk.     Time  3    Period  Weeks    Status  New    Target Date  02/21/18      PT SHORT TERM GOAL #4   Title  Patient will report no greater than a 5/10 pain over the course of a 1 week period indicating better tolerance to functional activities.     Time  3    Period  Weeks    Status  New    Target Date  02/21/18        PT Long Term Goals - 01/31/18 1651      PT LONG TERM GOAL #1   Title  Patient will demonstrate improvement of 1 MMT  strength grade in all defcient tested hip and knee musculature as evidence of improved strength to  assist with ambulation and stair ambulation.     Time  6    Period  Weeks    Status  New    Target Date  03/14/18      PT LONG TERM GOAL #2   Title  Patient will demonstrate improvement of 20 percent on the ABC scale indicating improved patient confidence with balance activities.     Time  6    Period  Weeks    Status  New    Target Date  03/14/18      PT LONG TERM GOAL #3   Title  Patient will perform TUG in 13.5 seconds or less indicating improved balance and decreased risk of falls.     Time  6    Period  Weeks    Status  New    Target Date  03/14/18      PT LONG TERM GOAL #4   Title  Patient will report no greater than a 2/10 pain over the course of a 1 week period indicating improved tolerance to daily activities.     Time  6    Period  Weeks    Status  New    Target Date  03/14/18      PT LONG TERM GOAL #5   Title  Patient will demonstrate improvement on Functional Gait Assessment of 9 points indicating improvement with balance during ambulation and low fall risk.     Time  6    Period  Weeks    Status  New    Target Date  03/14/18            Plan - 02/05/18 1204    Clinical Impression Statement  Reviewed HEP and body mechanics to assist with pain control.  Additional exercises added for lumbar stabilization to assist with proximal strengthening for pain control.  Pt able to complete all exercises with minimal cueing for stability with task.  Educated on importance of posture and assistance wiht lumbar support for pain control.  EOS reports decreased pain, no pain scale given.  Limited by time with balance activities, resume next session.      Rehab Potential  Fair    Clinical Impairments Affecting Rehab Potential  Positive: Motivated. Negative: Chronicity of problem, severity of pain, comorbidities    PT Frequency  2x / week    PT Duration  6 weeks    PT Treatment/Interventions  ADLs/Self Care Home Management;Aquatic Therapy;Cryotherapy;DME Instruction;Gait  training;Stair training;Functional mobility training;Therapeutic activities;Therapeutic exercise;Balance training;Neuromuscular re-education;Patient/family education;Manual techniques;Passive range of motion;Dry needling;Energy conservation;Taping    PT Next Visit Plan  Add DKTC and hamstring stretches.  Continue stability exercises supine and sidelying.  Next session begin hip abd/ext strengthening, heel/toe raises and 3D hip excursion.  Resume balance training for stability too.      PT Home Exercise Plan  02/03/2018 - ab set, pelvic tilt, supine march, SLS and tandem balance       Patient will benefit from skilled therapeutic intervention in order to improve the following deficits and impairments:  Abnormal gait, Decreased balance, Decreased endurance, Decreased mobility, Difficulty walking, Hypomobility, Impaired sensation, Decreased range of motion, Improper body mechanics, Obesity, Decreased activity tolerance, Decreased strength, Pain  Visit Diagnosis: Other abnormalities of gait and mobility  Chronic bilateral low back pain, with sciatica presence unspecified  Muscle weakness (generalized)     Problem List Patient Active Problem List  Diagnosis Date Noted  . Seasonal affective disorder ([place]) 12/15/2017  . Chest pain 02/27/2017  . Rectal bleeding 09/06/2016  . Abdominal cramping 09/06/2016  . Fatty liver 09/06/2016  . Dehydration 12/15/2015  . Orthostatic dizziness 12/15/2015  . Nausea vomiting and diarrhea 12/15/2015  . Iron deficiency anemia 03/25/2015  . Excessive or frequent menstruation 04/08/2014  . Dysmenorrhea 04/08/2014  . H/O adenomatous polyp of colon 01/24/2012  . FH: colon cancer 01/24/2012  . Esophageal dysphagia 01/24/2012  . Chronic diarrhea 01/24/2012  . Depression 06/22/2011  . GERD (gastroesophageal reflux disease) 06/22/2011  . DM (diabetes mellitus) ([place]) 06/22/2011  . Hypercholesterolemia 06/22/2011  . Hypertension 06/22/2011  . Obesity 06/22/2011   . Invasive ductal carcinoma of right breast [facility]) 05/22/2011   Ihor Austin, [place]; [place]  Aldona Lento 02/05/2018, 12:11 PM  [place], [place], [address] Phone: [phone]   Fax:  [phone]  Name: CADEE AGRO MRN: [id number] Date of Birth: 02-27-1966

## 2018-02-06 ENCOUNTER — Ambulatory Visit (HOSPITAL_COMMUNITY): Payer: BLUE CROSS/BLUE SHIELD | Admitting: Physical Therapy

## 2018-02-12 ENCOUNTER — Encounter (HOSPITAL_COMMUNITY): Payer: BLUE CROSS/BLUE SHIELD

## 2018-02-12 ENCOUNTER — Encounter (HOSPITAL_COMMUNITY): Payer: Self-pay

## 2018-02-12 ENCOUNTER — Ambulatory Visit (HOSPITAL_COMMUNITY): Payer: BLUE CROSS/BLUE SHIELD

## 2018-02-12 DIAGNOSIS — M545 Low back pain: Secondary | ICD-10-CM

## 2018-02-12 DIAGNOSIS — G8929 Other chronic pain: Secondary | ICD-10-CM

## 2018-02-12 DIAGNOSIS — R2689 Other abnormalities of gait and mobility: Secondary | ICD-10-CM

## 2018-02-12 DIAGNOSIS — M6281 Muscle weakness (generalized): Secondary | ICD-10-CM

## 2018-02-12 NOTE — Therapy (Signed)
[place], [place], [address] Phone: [phone]   Fax:  [phone]  Physical Therapy Treatment  Patient Details  Name: [name] MRN: [id number] Date of Birth: 1966-02-21 Referring Provider: Mikey Kirschner, MD   Encounter Date: 02/12/2018  PT End of Session - 02/12/18 1125    Visit Number  4    Number of Visits  12    Date for PT Re-Evaluation  03/14/18 minireassess 02/21/18    Authorization Type  BCBS Other     Authorization Time Period  01/31/18- 03/14/18    PT Start Time  1120    PT Stop Time  1202    PT Time Calculation (min)  42 min    Equipment Utilized During Treatment  Gait belt    Activity Tolerance  Patient tolerated treatment well;Patient limited by pain;No increased pain    Behavior During Therapy  WFL for tasks assessed/performed       Past Medical History:  Diagnosis Date  . Anxiety   . Blood transfusion without reported diagnosis 1999   due to heavy menses  . Breast cancer ([place]) 05/22/2011   03/01/11, Stage 2, s/p lumpectomy, chemo/xrt  . DDD (degenerative disc disease), lumbar   . Depression 06/22/2011  . Diverticulitis   . DM (diabetes mellitus) ([place]) 06/22/2011  . Genital warts   . GERD (gastroesophageal reflux disease) 06/22/2011  . Hx of adenomatous colonic polyps 10/2007   2mm sigmoid tubular adenoma, FH colon cancer, mother in mid-50s  . Hypercholesterolemia 06/22/2011  . Hypertension 06/22/2011  . 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[place]) 05/22/2011  . Iron deficiency anemia 03/25/2015  . Morbid obesity ([place])   . Shortness of breath   . 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 Vaginal bleeding 03/08/2014    Past Surgical History:  Procedure Laterality Date  . back surg x2    . CHOLECYSTECTOMY    . COLONOSCOPY  11/03/07   4-mm sessile polyp removed/small internal hemorrhoids/tubular adenoma, random colon bx negative for microscopic  colitis  . COLONOSCOPY WITH PROPOFOL N/A 09/17/2016   Procedure: COLONOSCOPY WITH PROPOFOL;  Surgeon: Daneil Dolin, MD;  [facility];  Service: Endoscopy;  Laterality: N/A;  9:00 am  . COLONOSCOPY, ESOPHAGOGASTRODUODENOSCOPY (EGD) AND ESOPHAGEAL DILATION  01/2012   SLF: empiric esophageal dilation, gastritis with benign bx, hemorrhoids  . DILATATION & CURETTAGE/HYSTEROSCOPY WITH MYOSURE N/A 02/10/2015   Procedure: DILATATION & CURETTAGE/HYSTEROSCOPY WITH MYOSURE;  Surgeon: Nunzio Cobbs, MD;  Location: [facility];  Service: Gynecology;  Laterality: N/A;  . MASTECTOMY PARTIAL / LUMPECTOMY     rt.  . MM BREAST STEREO BX*L*R/S     rt.  Marland Kitchen POLYPECTOMY  09/17/2016   Procedure: POLYPECTOMY;  Surgeon: Daneil Dolin, MD;  [facility];  Service: Endoscopy;;  ascending colon  . PORT-A-CATH REMOVAL  05/26/2012   Procedure: REMOVAL PORT-A-CATH;  Surgeon: Jamesetta So, MD;  Location: [facility];  Service: General;  Laterality: N/A;  Minor Room  . PORTACATH PLACEMENT      There were no vitals filed for this visit.  Subjective Assessment - 02/12/18 1117    Subjective  Pt stated she continues to have LBP and Lt LE radicular symptoms posterior thigh to feet    Patient Stated Goals  Be able to get around better and live a more normal life     Currently in Pain?  Yes    Pain Score  7     Pain Location  Back LBP with increased pain posterior Lt LE    Pain Orientation  Lower;Left;Posterior posterior Lt LE to feet    Pain Descriptors / Indicators  Sore    Pain Type  Chronic pain    Pain Onset  More than a month ago    Pain Frequency  Constant    Aggravating Factors   standing for a while    Pain Relieving Factors  not much, sitting for a minute    Effect of Pain on Daily Activities  severly                No data recorded       [facility] Adult PT Treatment/Exercise - 02/12/18 0001      Lumbar Exercises: Stretches   Passive Hamstring Stretch  2 reps;30 seconds  c/o increased numbness to Lt toes    Single Knee to Chest Stretch  3 reps;30 seconds towel assistance    Double Knee to Chest Stretch  1 rep;30 seconds c/o burning    Standing Extension  5 reps 2 sets    Prone on Elbows Stretch  3 reps;30 seconds reports of decreased radicular symptoms       Lumbar Exercises: Standing   Other Standing Lumbar Exercises  3D hip excursion (STS for squats)      Lumbar Exercises: Supine   Bent Knee Raise  10 reps;5 seconds    Bridge  10 reps      Lumbar Exercises: Prone   Other Prone Lumbar Exercises  heel squeeze 5x 5"          Balance Exercises - 02/12/18 1158      Balance Exercises: Standing   Tandem Stance  3 reps;30 secs last set with 2# dowel rod puches in tandem    SLS  5 reps;Solid surface Lt 13", Rt 20"    Sidestepping  2 reps;Theraband RTB          PT Short Term Goals - 01/31/18 1644      PT SHORT TERM GOAL #1   Title  Patient will demonstrate understanding and report regular compliance with home exercise program to improve strength and balance.     Time  3    Period  Weeks    Status  New    Target Date  02/21/18      PT SHORT TERM GOAL #2   Title  Patient will demonstrate improvement of 1/2 MMT strength grade in all deficient hip and knee musculature to improve patient's performance with gait and stair ambulation.     Time  3    Period  Weeks    Status  New    Target Date  02/21/18      PT SHORT TERM GOAL #3   Title  Patient will demonstrate improvement on Functional Gait Assessment of 4 points indicating improvement with balance during ambulation and decreased fall risk.     Time  3    Period  Weeks    Status  New    Target Date  02/21/18      PT SHORT TERM GOAL #4   Title  Patient will report no greater than a 5/10 pain over the course of a 1 week period indicating better tolerance to functional activities.     Time  3    Period  Weeks    Status  New    Target Date  02/21/18  PT Long Term Goals -  01/31/18 1651      PT LONG TERM GOAL #1   Title  Patient will demonstrate improvement of 1 MMT strength grade in all defcient tested hip and knee musculature as evidence of improved strength to assist with ambulation and stair ambulation.     Time  6    Period  Weeks    Status  New    Target Date  03/14/18      PT LONG TERM GOAL #2   Title  Patient will demonstrate improvement of 20 percent on the ABC scale indicating improved patient confidence with balance activities.     Time  6    Period  Weeks    Status  New    Target Date  03/14/18      PT LONG TERM GOAL #3   Title  Patient will perform TUG in 13.5 seconds or less indicating improved balance and decreased risk of falls.     Time  6    Period  Weeks    Status  New    Target Date  03/14/18      PT LONG TERM GOAL #4   Title  Patient will report no greater than a 2/10 pain over the course of a 1 week period indicating improved tolerance to daily activities.     Time  6    Period  Weeks    Status  New    Target Date  03/14/18      PT LONG TERM GOAL #5   Title  Patient will demonstrate improvement on Functional Gait Assessment of 9 points indicating improvement with balance during ambulation and low fall risk.     Time  6    Period  Weeks    Status  New    Target Date  03/14/18            Plan - 02/12/18 1205    Clinical Impression Statement  Added prone exercises to improve lumbar mobility and gluteal strengthening.  Pt with radicular symptoms Lt LE to toes today, reoprts of decrease numbness with extension based exercises.  Continued with core and proximal strengthening and balance activities for stabilty as well.  EOS pt reports decrease in pain to 4/10, was limited by fatigue.      Rehab Potential  Fair    Clinical Impairments Affecting Rehab Potential  Positive: Motivated. Negative: Chronicity of problem, severity of pain, comorbidities    PT Frequency  2x / week    PT Duration  6 weeks    PT  Treatment/Interventions  ADLs/Self Care Home Management;Aquatic Therapy;Cryotherapy;DME Instruction;Gait training;Stair training;Functional mobility training;Therapeutic activities;Therapeutic exercise;Balance training;Neuromuscular re-education;Patient/family education;Manual techniques;Passive range of motion;Dry needling;Energy conservation;Taping    PT Next Visit Plan  Continue stability exercises for core and proximal strengthening with extension based exercises to assist with radicular symptoms.  Next session begin heel/toe raises and mad cat/camel.      PT Home Exercise Plan  02/03/2018 - ab set, pelvic tilt, supine march, SLS and tandem balance       Patient will benefit from skilled therapeutic intervention in order to improve the following deficits and impairments:  Abnormal gait, Decreased balance, Decreased endurance, Decreased mobility, Difficulty walking, Hypomobility, Impaired sensation, Decreased range of motion, Improper body mechanics, Obesity, Decreased activity tolerance, Decreased strength, Pain  Visit Diagnosis: Other abnormalities of gait and mobility  Chronic bilateral low back pain, with sciatica presence unspecified  Muscle weakness (generalized)     Problem  List Patient Active Problem List   Diagnosis Date Noted  . Seasonal affective disorder ([place]) 12/15/2017  . Chest pain 02/27/2017  . Rectal bleeding 09/06/2016  . Abdominal cramping 09/06/2016  . Fatty liver 09/06/2016  . Dehydration 12/15/2015  . Orthostatic dizziness 12/15/2015  . Nausea vomiting and diarrhea 12/15/2015  . Iron deficiency anemia 03/25/2015  . Excessive or frequent menstruation 04/08/2014  . Dysmenorrhea 04/08/2014  . H/O adenomatous polyp of colon 01/24/2012  . FH: colon cancer 01/24/2012  . Esophageal dysphagia 01/24/2012  . Chronic diarrhea 01/24/2012  . Depression 06/22/2011  . GERD (gastroesophageal reflux disease) 06/22/2011  . DM (diabetes mellitus) ([place]) 06/22/2011  .  Hypercholesterolemia 06/22/2011  . Hypertension 06/22/2011  . Obesity 06/22/2011  . Invasive ductal carcinoma of right breast [facility]) 05/22/2011   Ihor Austin, Mercedes; [place]  Aldona Lento 02/12/2018, 12:14 PM  [place], [place], [address] Phone: [phone]   Fax:  [phone]  Name: [name] MRN: [id number] Date of Birth: January 20, 1966

## 2018-02-13 ENCOUNTER — Ambulatory Visit (HOSPITAL_COMMUNITY): Payer: BLUE CROSS/BLUE SHIELD | Admitting: Physical Therapy

## 2018-02-13 DIAGNOSIS — M545 Low back pain: Secondary | ICD-10-CM

## 2018-02-13 DIAGNOSIS — M6281 Muscle weakness (generalized): Secondary | ICD-10-CM

## 2018-02-13 DIAGNOSIS — R2689 Other abnormalities of gait and mobility: Secondary | ICD-10-CM

## 2018-02-13 DIAGNOSIS — G8929 Other chronic pain: Secondary | ICD-10-CM

## 2018-02-13 NOTE — Therapy (Signed)
[place], [place], [address] Phone: [phone]   Fax:  [phone]  Physical Therapy Treatment  Patient Details  Name: [name] MRN: [id number] Date of Birth: 07/03/1966 Referring Provider: Mikey Kirschner, MD   Encounter Date: 02/13/2018  PT End of Session - 02/13/18 1647    Visit Number  5    Number of Visits  12    Date for PT Re-Evaluation  03/14/18 minireassess 02/21/18    Authorization Type  BCBS Other     Authorization Time Period  01/31/18- 03/14/18    PT Start Time  1610    PT Stop Time  1655    PT Time Calculation (min)  45 min    Equipment Utilized During Treatment  Gait belt    Activity Tolerance  Patient tolerated treatment well;Patient limited by pain;No increased pain    Behavior During Therapy  WFL for tasks assessed/performed       Past Medical History:  Diagnosis Date  . Anxiety   . Blood transfusion without reported diagnosis 1999   due to heavy menses  . Breast cancer ([place]) 05/22/2011   03/01/11, Stage 2, s/p lumpectomy, chemo/xrt  . DDD (degenerative disc disease), lumbar   . Depression 06/22/2011  . Diverticulitis   . DM (diabetes mellitus) ([place]) 06/22/2011  . Genital warts   . GERD (gastroesophageal reflux disease) 06/22/2011  . Hx of adenomatous colonic polyps 10/2007   40mm sigmoid tubular adenoma, FH colon cancer, mother in mid-50s  . Hypercholesterolemia 06/22/2011  . Hypertension 06/22/2011  . 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[place]) 05/22/2011  . Iron deficiency anemia 03/25/2015  . Morbid obesity ([place])   . Shortness of breath   . 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 Vaginal bleeding 03/08/2014    Past Surgical History:  Procedure Laterality Date  . back surg x2    . CHOLECYSTECTOMY    . COLONOSCOPY  11/03/07   4-mm sessile polyp removed/small internal hemorrhoids/tubular adenoma, random colon bx negative for microscopic  colitis  . COLONOSCOPY WITH PROPOFOL N/A 09/17/2016   Procedure: COLONOSCOPY WITH PROPOFOL;  Surgeon: Daneil Dolin, MD;  [facility];  Service: Endoscopy;  Laterality: N/A;  9:00 am  . COLONOSCOPY, ESOPHAGOGASTRODUODENOSCOPY (EGD) AND ESOPHAGEAL DILATION  01/2012   SLF: empiric esophageal dilation, gastritis with benign bx, hemorrhoids  . DILATATION & CURETTAGE/HYSTEROSCOPY WITH MYOSURE N/A 02/10/2015   Procedure: DILATATION & CURETTAGE/HYSTEROSCOPY WITH MYOSURE;  Surgeon: Nunzio Cobbs, MD;  Location: [facility];  Service: Gynecology;  Laterality: N/A;  . MASTECTOMY PARTIAL / LUMPECTOMY     rt.  . MM BREAST STEREO BX*L*R/S     rt.  Marland Kitchen POLYPECTOMY  09/17/2016   Procedure: POLYPECTOMY;  Surgeon: Daneil Dolin, MD;  [facility];  Service: Endoscopy;;  ascending colon  . PORT-A-CATH REMOVAL  05/26/2012   Procedure: REMOVAL PORT-A-CATH;  Surgeon: Jamesetta So, MD;  Location: [facility];  Service: General;  Laterality: N/A;  Minor Room  . PORTACATH PLACEMENT      There were no vitals filed for this visit.  Subjective Assessment - 02/13/18 1705    Subjective  Pt states she's had increased LBP since her visit yesterday and down into Bil LE"s.  STatess she had a hard time going to sleep and was up/down all night.PT reports her pain is up to 8/10 today with radiculopathy into her  hips.  States she is unsure what caused it but thinks it's becuase she did extra sets of exercises last session.                  No data recorded       [facility] Adult PT Treatment/Exercise - 02/13/18 0001      Lumbar Exercises: Stretches   Passive Hamstring Stretch  2 reps;30 seconds    Single Knee to Chest Stretch  3 reps;30 seconds    Prone on Elbows Stretch  3 reps;30 seconds      Lumbar Exercises: Standing   Other Standing Lumbar Exercises  3D hip excursion (STS for squats) 5 reps each      Lumbar Exercises: Supine   Bent Knee Raise  10 reps;5 seconds    Bridge  10  reps      Lumbar Exercises: Prone   Other Prone Lumbar Exercises  heel squeeze 10x 5"    Other Prone Lumbar Exercises  press ups with over pressure 2X5 reps      Manual Therapy   Manual Therapy  Soft tissue mobilization    Manual therapy comments  completed seperately from all other skilled intervention    Soft tissue mobilization  to lower back, bilaterally and into glutes          Balance Exercises - 02/13/18 1626      Balance Exercises: Standing   Tandem Stance  Foam/compliant surface with 2# rod punches each direction    SLS  5 reps;Solid surface Rt:30", Lt: 14"    Sidestepping  2 reps;Theraband RTB          PT Short Term Goals - 01/31/18 1644      PT SHORT TERM GOAL #1   Title  Patient will demonstrate understanding and report regular compliance with home exercise program to improve strength and balance.     Time  3    Period  Weeks    Status  New    Target Date  02/21/18      PT SHORT TERM GOAL #2   Title  Patient will demonstrate improvement of 1/2 MMT strength grade in all deficient hip and knee musculature to improve patient's performance with gait and stair ambulation.     Time  3    Period  Weeks    Status  New    Target Date  02/21/18      PT SHORT TERM GOAL #3   Title  Patient will demonstrate improvement on Functional Gait Assessment of 4 points indicating improvement with balance during ambulation and decreased fall risk.     Time  3    Period  Weeks    Status  New    Target Date  02/21/18      PT SHORT TERM GOAL #4   Title  Patient will report no greater than a 5/10 pain over the course of a 1 week period indicating better tolerance to functional activities.     Time  3    Period  Weeks    Status  New    Target Date  02/21/18        PT Long Term Goals - 01/31/18 1651      PT LONG TERM GOAL #1   Title  Patient will demonstrate improvement of 1 MMT strength grade in all defcient tested hip and knee musculature as evidence of improved  strength to assist with ambulation and stair ambulation.     Time  6    Period  Weeks    Status  New    Target Date  03/14/18      PT LONG TERM GOAL #2   Title  Patient will demonstrate improvement of 20 percent on the ABC scale indicating improved patient confidence with balance activities.     Time  6    Period  Weeks    Status  New    Target Date  03/14/18      PT LONG TERM GOAL #3   Title  Patient will perform TUG in 13.5 seconds or less indicating improved balance and decreased risk of falls.     Time  6    Period  Weeks    Status  New    Target Date  03/14/18      PT LONG TERM GOAL #4   Title  Patient will report no greater than a 2/10 pain over the course of a 1 week period indicating improved tolerance to daily activities.     Time  6    Period  Weeks    Status  New    Target Date  03/14/18      PT LONG TERM GOAL #5   Title  Patient will demonstrate improvement on Functional Gait Assessment of 9 points indicating improvement with balance during ambulation and low fall risk.     Time  6    Period  Weeks    Status  New    Target Date  03/14/18            Plan - 02/13/18 1647    Clinical Impression Statement  No new exercises added today as was planned due to increased pain/symptoms today.  Extension seems to help ease her symptoms so did complete press ups in prone and added manual to help loosen tight musculature and decrease spasms.   Pt voiced overall improvement following manual with pain reduction to 6/10.      Rehab Potential  Fair    Clinical Impairments Affecting Rehab Potential  Positive: Motivated. Negative: Chronicity of problem, severity of pain, comorbidities    PT Frequency  2x / week    PT Duration  6 weeks    PT Treatment/Interventions  ADLs/Self Care Home Management;Aquatic Therapy;Cryotherapy;DME Instruction;Gait training;Stair training;Functional mobility training;Therapeutic activities;Therapeutic exercise;Balance training;Neuromuscular  re-education;Patient/family education;Manual techniques;Passive range of motion;Dry needling;Energy conservation;Taping    PT Next Visit Plan  Continue stability exercises for core and proximal strengthening with extension based exercises to assist with radicular symptoms.  Next session begin heel/toe raises, lunges, vectors  and mad cat/camel if symptoms reduced.  Complete manual PRN.     PT Home Exercise Plan  02/03/2018 - ab set, pelvic tilt, supine march, SLS and tandem balance       Patient will benefit from skilled therapeutic intervention in order to improve the following deficits and impairments:  Abnormal gait, Decreased balance, Decreased endurance, Decreased mobility, Difficulty walking, Hypomobility, Impaired sensation, Decreased range of motion, Improper body mechanics, Obesity, Decreased activity tolerance, Decreased strength, Pain  Visit Diagnosis: Other abnormalities of gait and mobility  Chronic bilateral low back pain, with sciatica presence unspecified  Muscle weakness (generalized)     Problem List Patient Active Problem List   Diagnosis Date Noted  . Seasonal affective disorder ([place]) 12/15/2017  . Chest pain 02/27/2017  . Rectal bleeding 09/06/2016  . Abdominal cramping 09/06/2016  . Fatty liver 09/06/2016  . Dehydration 12/15/2015  . Orthostatic dizziness 12/15/2015  . Nausea vomiting and  diarrhea 12/15/2015  . Iron deficiency anemia 03/25/2015  . Excessive or frequent menstruation 04/08/2014  . Dysmenorrhea 04/08/2014  . H/O adenomatous polyp of colon 01/24/2012  . FH: colon cancer 01/24/2012  . Esophageal dysphagia 01/24/2012  . Chronic diarrhea 01/24/2012  . Depression 06/22/2011  . GERD (gastroesophageal reflux disease) 06/22/2011  . DM (diabetes mellitus) ([place]) 06/22/2011  . Hypercholesterolemia 06/22/2011  . Hypertension 06/22/2011  . Obesity 06/22/2011  . 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[place]) 05/22/2011   Teena Irani,  PTA/CLT [phone]  Teena Irani 02/13/2018, 5:10 PM  [place], [place], [address] Phone: [phone]   Fax:  [phone]  Name: [name] MRN: [id number] Date of Birth: 31-May-1966

## 2018-02-17 ENCOUNTER — Telehealth (HOSPITAL_COMMUNITY): Payer: Self-pay

## 2018-02-17 NOTE — Telephone Encounter (Signed)
Patient canceled for this week she have a stomach virus

## 2018-02-18 ENCOUNTER — Encounter (HOSPITAL_COMMUNITY): Payer: BLUE CROSS/BLUE SHIELD

## 2018-02-19 ENCOUNTER — Encounter (HOSPITAL_COMMUNITY): Payer: BLUE CROSS/BLUE SHIELD

## 2018-02-24 ENCOUNTER — Telehealth (HOSPITAL_COMMUNITY): Payer: Self-pay | Admitting: Physical Therapy

## 2018-02-24 NOTE — Telephone Encounter (Signed)
Pt cx all April apptments due to lack of finances - patient requested to be put on hold until May 9th. Re-eval was scheduled for that date. NF 02/24/18

## 2018-02-25 ENCOUNTER — Encounter (HOSPITAL_COMMUNITY): Payer: BLUE CROSS/BLUE SHIELD

## 2018-02-26 ENCOUNTER — Encounter (HOSPITAL_COMMUNITY): Payer: BLUE CROSS/BLUE SHIELD

## 2018-03-02 ENCOUNTER — Other Ambulatory Visit: Payer: Self-pay | Admitting: Cardiology

## 2018-03-05 ENCOUNTER — Encounter (HOSPITAL_COMMUNITY): Payer: BLUE CROSS/BLUE SHIELD

## 2018-03-07 ENCOUNTER — Encounter (HOSPITAL_COMMUNITY): Payer: BLUE CROSS/BLUE SHIELD | Admitting: Physical Therapy

## 2018-03-11 ENCOUNTER — Encounter (HOSPITAL_COMMUNITY): Payer: BLUE CROSS/BLUE SHIELD | Admitting: Physical Therapy

## 2018-03-13 ENCOUNTER — Encounter (HOSPITAL_COMMUNITY): Payer: BLUE CROSS/BLUE SHIELD | Admitting: Physical Therapy

## 2018-03-17 ENCOUNTER — Other Ambulatory Visit: Payer: Self-pay | Admitting: Adult Health

## 2018-03-17 DIAGNOSIS — Z1231 Encounter for screening mammogram for malignant neoplasm of breast: Secondary | ICD-10-CM

## 2018-03-20 ENCOUNTER — Encounter: Payer: Self-pay | Admitting: Nurse Practitioner

## 2018-03-20 ENCOUNTER — Encounter: Payer: Self-pay | Admitting: Family Medicine

## 2018-03-20 ENCOUNTER — Ambulatory Visit (INDEPENDENT_AMBULATORY_CARE_PROVIDER_SITE_OTHER): Payer: BLUE CROSS/BLUE SHIELD | Admitting: Nurse Practitioner

## 2018-03-20 VITALS — BP 122/74 | Temp 98.9°F | Ht 63.5 in | Wt 257.0 lb

## 2018-03-20 DIAGNOSIS — R319 Hematuria, unspecified: Secondary | ICD-10-CM | POA: Diagnosis not present

## 2018-03-20 DIAGNOSIS — R300 Dysuria: Secondary | ICD-10-CM

## 2018-03-20 LAB — POCT URINALYSIS DIPSTICK
Spec Grav, UA: 1.02 (ref 1.010–1.025)
pH, UA: 5 (ref 5.0–8.0)

## 2018-03-20 LAB — POCT UA - MICROSCOPIC ONLY

## 2018-03-20 MED ORDER — CIPROFLOXACIN HCL 500 MG PO TABS: 500.0000 mg | ORAL_TABLET | Freq: Two times a day (BID) | ORAL | 0 refills | Status: DC

## 2018-03-21 ENCOUNTER — Encounter: Payer: Self-pay | Admitting: Nurse Practitioner

## 2018-03-21 NOTE — Progress Notes (Signed)
Subjective: Presents for complaints of dysuria that started yesterday.  Urgency.  Frequency.  Slight blood in her urine last night.  Pelvic pressure with pain.  Right-sided back pain.  Minimal vaginal discharge.  Same sexual partner for the past 3 years.  No history of recent UTI.  No fever.  Taking fluids well.  Objective:   BP 122/74   Temp 98.9 F (37.2 C) (Oral)   Ht 5' 3.5" (1.613 m)   Wt 257 lb (116.6 kg)   LMP 04/05/2016 Comment: spotting 11/2016 and 12/2016  BMI 44.81 kg/m  NAD.  Alert, oriented.  Lungs clear.  Heart regular rate and rhythm.  Very mild right CVA area tenderness noted.  No flank tenderness.  Abdomen obese soft nondistended with mild suprapubic area tenderness. Results for orders placed or performed in visit on 03/20/18  POCT urinalysis dipstick  Result Value Ref Range   Color, UA     Clarity, UA     Glucose, UA     Bilirubin, UA ++    Ketones, UA     Spec Grav, UA 1.020 1.010 - 1.025   Blood, UA     pH, UA 5.0 5.0 - 8.0   Protein, UA     Urobilinogen, UA  0.2 or 1.0 E.U./dL   Nitrite, UA trace    Leukocytes, UA Trace (A) Negative   Appearance     Odor    POCT UA - Microscopic Only  Result Value Ref Range   WBC, Ur, HPF, POC rare    RBC, urine, microscopic rare    Bacteria, U Microscopic rare    Mucus, UA     Epithelial cells, urine per micros     Crystals, Ur, HPF, POC     Casts, Ur, LPF, POC     Yeast, UA       Assessment:  Dysuria - Plan: POCT urinalysis dipstick, Urine Culture, POCT UA - Microscopic Only  Hematuria, unspecified type    Plan:   Meds ordered this encounter  Medications  . ciprofloxacin (CIPRO) 500 MG tablet    Sig: Take 1 tablet (500 mg total) by mouth 2 (two) times daily.    Dispense:  14 tablet    Refill:  0    Order Specific Question:   Supervising Provider    Answer:   Maggie Font   May take AZO for 48 hours then discontinue.  Increase clear fluid intake.  Urine culture pending.  Warning signs reviewed.   Call back in 4 days if no improvement, call or go to ED sooner if worse.

## 2018-03-22 LAB — SPECIMEN STATUS REPORT

## 2018-03-22 LAB — URINE CULTURE

## 2018-03-25 ENCOUNTER — Ambulatory Visit (HOSPITAL_COMMUNITY)
Admission: RE | Admit: 2018-03-25 | Discharge: 2018-03-25 | Disposition: A | Discharge status: Home / Self Care | Payer: BLUE CROSS/BLUE SHIELD | Source: Ambulatory Visit | Attending: Adult Health | Admitting: Adult Health

## 2018-03-25 ENCOUNTER — Other Ambulatory Visit: Payer: Self-pay | Admitting: Adult Health

## 2018-03-25 ENCOUNTER — Other Ambulatory Visit (HOSPITAL_COMMUNITY): Payer: Self-pay | Admitting: Adult Health

## 2018-03-25 ENCOUNTER — Telehealth (HOSPITAL_COMMUNITY): Payer: Self-pay | Admitting: Family Medicine

## 2018-03-25 ENCOUNTER — Encounter (HOSPITAL_COMMUNITY): Payer: Self-pay

## 2018-03-25 DIAGNOSIS — M545 Low back pain: Secondary | ICD-10-CM | POA: Diagnosis present

## 2018-03-25 DIAGNOSIS — C50911 Malignant neoplasm of unspecified site of right female breast: Secondary | ICD-10-CM

## 2018-03-25 DIAGNOSIS — R928 Other abnormal and inconclusive findings on diagnostic imaging of breast: Secondary | ICD-10-CM

## 2018-03-25 DIAGNOSIS — M6281 Muscle weakness (generalized): Secondary | ICD-10-CM | POA: Diagnosis present

## 2018-03-25 DIAGNOSIS — G8929 Other chronic pain: Secondary | ICD-10-CM | POA: Diagnosis present

## 2018-03-25 DIAGNOSIS — R2689 Other abnormalities of gait and mobility: Secondary | ICD-10-CM | POA: Insufficient documentation

## 2018-03-25 NOTE — Telephone Encounter (Signed)
03/25/18  pt called to cx and she didn't want to make anymore... she said that she had talked to her dr about her not being able to afford coming to therapy

## 2018-03-27 ENCOUNTER — Ambulatory Visit (HOSPITAL_COMMUNITY): Payer: BLUE CROSS/BLUE SHIELD | Admitting: Physical Therapy

## 2018-04-01 ENCOUNTER — Ambulatory Visit (HOSPITAL_COMMUNITY)
Admission: RE | Admit: 2018-04-01 | Discharge: 2018-04-01 | Disposition: A | Discharge status: Home / Self Care | Payer: BLUE CROSS/BLUE SHIELD | Source: Ambulatory Visit | Attending: Adult Health | Admitting: Adult Health

## 2018-04-01 ENCOUNTER — Encounter (HOSPITAL_COMMUNITY): Payer: Self-pay | Admitting: Physical Therapy

## 2018-04-01 ENCOUNTER — Ambulatory Visit (HOSPITAL_COMMUNITY): Payer: BLUE CROSS/BLUE SHIELD | Admitting: Adult Health

## 2018-04-01 ENCOUNTER — Ambulatory Visit (HOSPITAL_COMMUNITY): Payer: BLUE CROSS/BLUE SHIELD | Admitting: Internal Medicine

## 2018-04-01 DIAGNOSIS — R928 Other abnormal and inconclusive findings on diagnostic imaging of breast: Secondary | ICD-10-CM | POA: Diagnosis not present

## 2018-04-01 NOTE — Therapy (Signed)
[place], [place], [address] Phone: [phone]   Fax:  [phone]  Patient Details  Name: [name] MRN: [id number] Date of Birth: 11-10-1966 Referring Provider:  No ref. provider found  Encounter Date: 04/01/2018  PHYSICAL THERAPY DISCHARGE SUMMARY  Visits from Start of Care: 5  Current functional level related to goals / functional outcomes: Unable to fully assess as patient did not return for re-assessment see therapy note on 02/13/18 for most recent progression.    Remaining deficits: Unable to fully assess as patient did not return for re-assessment see therapy note on 02/13/18 for most recent progression.    Education / Equipment: HEP was provided to patient, however unable to provide further education as patient did not return after 5th visit.   Plan: Patient agrees to discharge.  Patient goals were not met. Patient is being discharged due to not returning since the last visit.  ?????         Clarene Critchley PT, DPT 3:31 PM, 04/01/18 [place], [place], [address] Phone: [phone]   Fax:  [phone]

## 2018-04-06 ENCOUNTER — Other Ambulatory Visit: Payer: Self-pay | Admitting: Cardiology

## 2018-04-10 ENCOUNTER — Encounter (HOSPITAL_COMMUNITY): Payer: Self-pay | Admitting: Internal Medicine

## 2018-04-10 ENCOUNTER — Inpatient Hospital Stay (HOSPITAL_COMMUNITY): Payer: BLUE CROSS/BLUE SHIELD | Attending: Internal Medicine | Admitting: Internal Medicine

## 2018-04-10 ENCOUNTER — Other Ambulatory Visit: Payer: Self-pay

## 2018-04-10 VITALS — BP 136/88 | HR 85 | Temp 98.2°F | Resp 16 | Wt 254.3 lb

## 2018-04-10 DIAGNOSIS — E119 Type 2 diabetes mellitus without complications: Secondary | ICD-10-CM | POA: Diagnosis not present

## 2018-04-10 DIAGNOSIS — Z9221 Personal history of antineoplastic chemotherapy: Secondary | ICD-10-CM

## 2018-04-10 DIAGNOSIS — Z853 Personal history of malignant neoplasm of breast: Secondary | ICD-10-CM | POA: Diagnosis not present

## 2018-04-10 DIAGNOSIS — C50911 Malignant neoplasm of unspecified site of right female breast: Secondary | ICD-10-CM

## 2018-04-10 DIAGNOSIS — Z923 Personal history of irradiation: Secondary | ICD-10-CM | POA: Diagnosis not present

## 2018-04-10 NOTE — Progress Notes (Signed)
Diagnosis 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[place]) - Plan: CBC with Differential/Platelet, Comprehensive metabolic panel, Lactate dehydrogenase  Staging Cancer Staging 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HCC) Staging form: Breast, AJCC 7th Edition - Clinical: Stage IIA (T2, N0, cM0) - Unsigned   Assessment and Plan: 1.  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 Pt was originally seen by Dr. Tressie Stalker and Dr Whitney Muse.   She was diagnosed 02/2011; treated with lumpectomy, followed by Adriamycin/Cytoxan x 4, followed by Taxol x 12. She went on to complete adjuvant breast radiation as well in 12/2011. Tamoxifen was recommended given PR+, but patient declined. Endocrine panel showed 2% PR positivity on original path, and pt was suspected to be considered triple negative in terms of aggressive biology with Ki67 90%. She has remained on surveillance alone since completing treatment.   She had left diagnostic mammogram and USN done 04/01/2018 that showed  Targeted ultrasound of the upper left breast was performed demonstrating several small areas of fibrocystic change. A cluster of cysts in the left breast at 12 o'clock 3 cm from nipple measures 0.7 x 0.3 by 0.4 cm. An additional cluster of cysts in the left breast at the 11:30 position 3 cm from nipple measures 0.5 x 0.3 x 0.4 cm. This corresponds well with the mass seen in the left breast at mammography. No suspicious masses or abnormality seen in the superior left breast.  IMPRESSION: No findings of malignancy in the left breast.  RECOMMENDATION: Screening mammogram in one year.(Code:[id number])  Right breast was imaged on screening mammogram that was done 03/25/2018 and showed no right breast abnormalities.  This study showed ? Left breast mass that was evaluated with USN and Dx mammogram on 04/01/2018.    Pt denies any palpable abnormalities in breasts.  She refuses breast exam today.    I have  discussed with her if she has breast concerns, we can refer pt back to Dr. Arnoldo Morale who did her original surgery.  He has been notified of recent imaging and is happy to see pt back if she desires.    She is 7 yrs out from her diagnosis.  She has the option of ongoing follow-up at [facility] through 10 yrs which would be 02/2021.    She will follow-up in 03/2019 for labs and screening mammogram in 03/2019.  She is advised to notify the office if she has any problems prior to her next visit.    2.  HTN.  BP is 136/88.  Follow-up with PCP as directed.    3.  Health maintenance.  GI and Dexa evaluation as recommended through PCP.    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  Pt was diagnosed 02/2011; treated with lumpectomy, followed by Adriamycin/Cytoxan x 4, followed by Taxol x 12. She went on to complete adjuvant breast radiation as well in 12/2011. Tamoxifen was recommended given PR+, but patient declined. Endocrine panel showed 2% PR positivity on original path, and pt was suspected to be considered triple negative in terms of aggressive biology with Ki67 90%. She has remained on surveillance alone since completing treatment.   Current Status:  Pt is seen today for follow-up.  She is here to go over mammogram.  She is refusing breast exam.  She denies any breast complaints.    PATHOLOGY:  (R) breast lumpectomy surgical path: 03/21/11          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[place])  02/28/2011 Initial Diagnosis    Right needle core biopsy demonstrating Invasive ductal carcinoma of right breast      03/21/2011 Surgery    Right lumpectomy demonstrating a 3.8 cm invasive ductal carcinoma, grade III, no LVI, and 0/1 lymph nodes      05/11/2011 - 07/13/2011 Chemotherapy    AC x 4      08/03/2011 - 10/19/2011 Chemotherapy    Paclitaxel weekly x 12      11/07/2011 - 12/31/2011 Radiation Therapy         02/10/2015 Procedure     Hysteroscopy with dilation and curettage by Dr. Josefa Half.      02/10/2015 Pathology Results    Diagnosis Endometrium, curettage - ABUNDANT BLOOD AND DEGENERATIVE SECRETORY ENDOMETRIUM WITH STROMAL BREAKDOWN (VERY LIMITED MATERIAL). - INFLAMED SQUAMOUS EPITHELIUM AND ENDOCERVICAL MUCOSA, NO DYSPLASIA OR MALIGNANCY.        Problem List Patient Active Problem List   Diagnosis Date Noted  . Seasonal affective disorder ([place]) [F33.8] 12/15/2017  . Chest pain [R07.9] 02/27/2017  . Rectal bleeding [K62.5] 09/06/2016  . Abdominal cramping [R10.9] 09/06/2016  . Fatty liver [K76.0] 09/06/2016  . Dehydration [E86.0] 12/15/2015  . Orthostatic dizziness [R42] 12/15/2015  . Nausea vomiting and diarrhea [R11.2, R19.7] 12/15/2015  . Iron deficiency anemia [D50.9] 03/25/2015  . Excessive or frequent menstruation [N92.0] 04/08/2014  . Dysmenorrhea [N94.6] 04/08/2014  . H/O adenomatous polyp of colon [Z86.010] 01/24/2012  . FH: colon cancer [Z80.0] 01/24/2012  . Esophageal dysphagia [R13.10] 01/24/2012  . Chronic diarrhea [K52.9] 01/24/2012  . Depression [F32.9] 06/22/2011  . GERD (gastroesophageal reflux disease) [K21.9] 06/22/2011  . DM (diabetes mellitus) ([place]) [E11.9] 06/22/2011  . Hypercholesterolemia [E78.00] 06/22/2011  . Hypertension [I10] 06/22/2011  . Obesity [E66.9] 06/22/2011  . Invasive ductal carcinoma of right breast [facility]) [C50.911] 05/22/2011    Past Medical History Past Medical History:  Diagnosis Date  . Anxiety   . Blood transfusion without reported diagnosis 1999   due to heavy menses  . Breast cancer ([place]) 05/22/2011   03/01/11, Stage 2, s/p lumpectomy, chemo/xrt  . DDD (degenerative disc disease), lumbar   . Depression 06/22/2011  . Diverticulitis   . DM (diabetes mellitus) ([place]) 06/22/2011  . Genital warts   . GERD (gastroesophageal reflux disease) 06/22/2011  . Hx of adenomatous colonic polyps 10/2007   17m sigmoid tubular adenoma, FH colon cancer, mother in mid-50s    . Hypercholesterolemia 06/22/2011  . Hypertension 06/22/2011  . 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 05/22/2011  . Iron deficiency anemia 03/25/2015  . Morbid obesity ([place]   . Shortness of breath   . 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 Vaginal bleeding 03/08/2014    Past Surgical History Past Surgical History:  Procedure Laterality Date  . back surg x2    . BREAST LUMPECTOMY Right 2012  . CHOLECYSTECTOMY    . COLONOSCOPY  11/03/07   4-mm sessile polyp removed/small internal hemorrhoids/tubular adenoma, random colon bx negative for microscopic colitis  . COLONOSCOPY WITH PROPOFOL N/A 09/17/2016   Procedure: COLONOSCOPY WITH PROPOFOL;  Surgeon: RDaneil Dolin MD;  [facility];  Service: Endoscopy;  Laterality: N/A;  9:00 am  . COLONOSCOPY, ESOPHAGOGASTRODUODENOSCOPY (EGD) AND ESOPHAGEAL DILATION  01/2012   SLF: empiric esophageal dilation, gastritis with benign bx, hemorrhoids  . DILATATION & CURETTAGE/HYSTEROSCOPY WITH MYOSURE N/A 02/10/2015   Procedure: DILATATION & CURETTAGE/HYSTEROSCOPY WITH MYOSURE;  Surgeon: BNunzio Cobbs MD;  Location: [facility];  Service: Gynecology;  Laterality: N/A;  . MM BREAST STEREO BX*L*R/S     rt.  Marland Kitchen POLYPECTOMY  09/17/2016   Procedure: POLYPECTOMY;  Surgeon: Daneil Dolin, MD;  [facility];  Service: Endoscopy;;  ascending colon  . PORT-A-CATH REMOVAL  05/26/2012   Procedure: REMOVAL PORT-A-CATH;  Surgeon: Jamesetta So, MD;  Location: [facility];  Service: General;  Laterality: N/A;  Minor Room  . PORTACATH PLACEMENT      Family History Family History  Problem Relation Age of Onset  . Colon cancer Mother        mid-50s, died of metastatic disease  . Cancer Mother        liver  . Coronary artery disease Mother   . Diabetes type I Mother   . Peripheral vascular disease Mother        Carotid disease in her 28s  . Coronary artery disease Father        CAD in his 45s  .  Diabetes Father   . Diabetes type I Father   . Hypertension Father   . Bipolar disorder Sister   . Coronary artery disease Brother        MI in his 63s  . Hypertension Brother   . Hypertension Maternal Grandmother   . Hyperlipidemia Maternal Grandmother   . Stroke Maternal Grandmother   . Hypertension Maternal Grandfather   . Diabetes Maternal Grandfather   . Diabetes Paternal Grandmother   . Heart disease Paternal Grandfather      Social History  reports that she has never smoked. She has never used smokeless tobacco. She reports that she does not drink alcohol or use drugs.  Medications  Current Outpatient Medications:  .  albuterol (PROVENTIL HFA;VENTOLIN HFA) 108 (90 Base) MCG/ACT inhaler, Inhale 2 puffs into the lungs every 6 (six) hours as needed for wheezing or shortness of breath., Disp: 1 Inhaler, Rfl: 2 .  atorvastatin (LIPITOR) 40 MG tablet, TAKE 1 TABLET BY MOUTH ONCE DAILY   **NEED FOLLOW UP APPOINTMENT FOR FUTURE REFILLS**, Disp: 15 tablet, Rfl: 0 .  blood glucose meter kit and supplies KIT, Dispense based on patient and insurance preference. TEST ONCE DAILY. DIABETES TYPE 2 ICD 10 CODE E11.9, Disp: 1 each, Rfl: 5 .  citalopram (CELEXA) 40 MG tablet, Take 1 tablet (40 mg total) by mouth daily., Disp: 90 tablet, Rfl: 1 .  lisinopril (PRINIVIL,ZESTRIL) 10 MG tablet, TAKE 1 TABLET BY MOUTH ONCE DAILY "DISCONTINUE LISINOPRIL/HCTZ", Disp: 90 tablet, Rfl: 1 .  metFORMIN (GLUCOPHAGE) 500 MG tablet, Take 1 tablet (500 mg total) by mouth 2 (two) times daily., Disp: 180 tablet, Rfl: 1 .  pantoprazole (PROTONIX) 40 MG tablet, TAKE 1 TABLET BY MOUTH ONCE DAILY FOR  ACID  REFLUX, Disp: 90 tablet, Rfl: 1  Allergies Flagyl [metronidazole] and Pravastatin  Review of Systems Review of Systems - Oncology ROS as per HPI otherwise 12 point ROS is negative.   Physical Exam  Vitals Wt Readings from Last 3 Encounters:  04/10/18 254 lb 4.8 oz (115.3 kg)  03/20/18 257 lb (116.6 kg)    01/31/18 260 lb (117.9 kg)   Temp Readings from Last 3 Encounters:  04/10/18 98.2 F (36.8 C) (Oral)  03/20/18 98.9 F (37.2 C) (Oral)  11/14/17 98.5 F (36.9 C) (Oral)   BP Readings from Last 3 Encounters:  04/10/18 136/88  03/20/18 122/74  01/31/18 (!) 142/84   Pulse Readings from Last 3 Encounters:  04/10/18 85  01/31/18 88  10/17/17 100   Constitutional: Well-developed, well-nourished, and in no distress.   HENT: Head: Normocephalic and atraumatic.  Mouth/Throat: No oropharyngeal exudate. Mucosa moist. Eyes: Pupils are equal, round, and reactive to light. Conjunctivae are normal. No scleral icterus.  Neck: Normal range of motion. Neck supple. No JVD present.  Cardiovascular: Normal rate, regular rhythm and normal heart sounds.  Exam reveals no gallop and no friction rub.   No murmur heard. Pulmonary/Chest: Effort normal and breath sounds normal. No respiratory distress. No wheezes.No rales.  Abdominal: Soft. Bowel sounds are normal. No distension. There is no tenderness. There is no guarding.  Musculoskeletal: No edema or tenderness.  Lymphadenopathy: No cervical, axillary or supraclavicular adenopathy.  Neurological: Alert and oriented to person, place, and time. No cranial nerve deficit.  Skin: Skin is warm and dry. No rash noted. No erythema. No pallor.  Psychiatric: Affect and judgment normal.  Breast exam:  Pt refused exam.    Labs No visits with results within 3 Day(s) from this visit.  Latest known visit with results is:  Office Visit on 03/20/2018  Component Date Value Ref Range Status  . Bilirubin, UA 03/20/2018 ++   Final  . Spec Grav, UA 03/20/2018 1.020  1.010 - 1.025 Final  . pH, UA 03/20/2018 5.0  5.0 - 8.0 Final  . Nitrite, UA 03/20/2018 trace   Final  . Leukocytes, UA 03/20/2018 Trace* Negative Final  . Urine Culture, Routine 03/20/2018 Final report*  Final  . Organism ID, Bacteria 03/20/2018 Escherichia coli*  Final   Comment: Greater than  100,000 colony forming units per mL Cefazolin <=4 ug/mL Cefazolin with an MIC <=16 predicts susceptibility to the oral agents cefaclor, cefdinir, cefpodoxime, cefprozil, cefuroxime, cephalexin, and loracarbef when used for therapy of uncomplicated urinary tract infections due to E. coli, Klebsiella pneumoniae, and Proteus mirabilis.   Marland Kitchen Antimicrobial Susceptibility 03/20/2018 Comment   Final   Comment:       ** S = Susceptible; I = Intermediate; R = Resistant **                    P = Positive; N = Negative             MICS are expressed in micrograms per mL    Antibiotic                 RSLT#1    RSLT#2    RSLT#3    RSLT#4 Amoxicillin/Clavulanic Acid    S Ampicillin                     R Cefepime                       S Ceftriaxone                    S Cefuroxime                     S Ciprofloxacin                  S Ertapenem                      S Gentamicin                     S Imipenem  S Levofloxacin                   S Meropenem                      S Nitrofurantoin                 S Piperacillin/Tazobactam        S Tetracycline                   S Tobramycin                     S Trimethoprim/Sulfa             S   . WBC, Ur, HPF, POC 03/20/2018 rare   Final  . RBC, urine, microscopic 03/20/2018 rare   Final  . Bacteria, U Microscopic 03/20/2018 rare   Final  . specimen status report 03/20/2018 Comment   Final   Comment: Please note Please note The date and/or time of collection was not indicated on the requisition as required by state and federal law.  The date of receipt of the specimen was used as the collection date if not supplied.      Pathology Orders Placed This Encounter  Procedures  . CBC with Differential/Platelet    Standing Status:   Future    Standing Expiration Date:   04/10/2020  . Comprehensive metabolic panel    Standing Status:   Future    Standing Expiration Date:   04/10/2020  . Lactate dehydrogenase    Standing  Status:   Future    Standing Expiration Date:   04/10/2020       Zoila Shutter MD

## 2018-04-11 ENCOUNTER — Ambulatory Visit: Payer: BLUE CROSS/BLUE SHIELD | Admitting: Nurse Practitioner

## 2018-04-11 ENCOUNTER — Encounter: Payer: Self-pay | Admitting: Nurse Practitioner

## 2018-04-11 VITALS — BP 140/86 | Ht 63.5 in | Wt 251.6 lb

## 2018-04-11 DIAGNOSIS — E65 Localized adiposity: Secondary | ICD-10-CM | POA: Diagnosis not present

## 2018-04-11 DIAGNOSIS — E119 Type 2 diabetes mellitus without complications: Secondary | ICD-10-CM

## 2018-04-11 LAB — POCT GLYCOSYLATED HEMOGLOBIN (HGB A1C): Hemoglobin A1C: 6.1 % — AB (ref 4.0–5.6)

## 2018-04-11 NOTE — Patient Instructions (Signed)
OTC anthistamine Steroid nasal spray such as Nasacort AQ

## 2018-04-12 ENCOUNTER — Encounter: Payer: Self-pay | Admitting: Nurse Practitioner

## 2018-04-12 DIAGNOSIS — E65 Localized adiposity: Secondary | ICD-10-CM | POA: Insufficient documentation

## 2018-04-12 NOTE — Progress Notes (Signed)
Subjective:  Presents for recheck on her diabetes. Has stopped all sodas and decreased bread intake. Trying to have a healthier lifestyle. Difficulty losing weight as she has gotten older. No CP/ischemic type pain or SOB. Has an eye exam scheduled for August.   Objective:   BP 140/86   Ht 5' 3.5" (1.613 m)   Wt 251 lb 9.6 oz (114.1 kg)   LMP 04/05/2016 Comment: spotting 11/2016 and 12/2016  BMI 43.87 kg/m  NAD. Alert, oriented. Lungs clear. Heart RRR.  Results for orders placed or performed in visit on 04/11/18  POCT HgB A1C  Result Value Ref Range   Hemoglobin A1C 6.1 (A) 4.0 - 5.6 %   HbA1c, POC (prediabetic range)  5.7 - 6.4 %   HbA1c, POC (controlled diabetic range)  0.0 - 7.0 %   Diabetic Foot Exam - Simple   Simple Foot Form Diabetic Foot exam was performed with the following findings:  Yes 04/11/2018  4:20 PM  Visual Inspection No deformities, no ulcerations, no other skin breakdown bilaterally:  Yes Sensation Testing See comments:  Yes Pulse Check See comments:  Yes Comments DP pulses present bilat; toes warm with normal cap refill. Monofilament and sensation normal right foot; present in great toe on the left but diminished in other toes. Patient states this is associated with her back pain.     Significant central adiposity noted.   Assessment:   Problem List Items Addressed This Visit      Endocrine   DM (diabetes mellitus) ([place]) - Primary   Relevant Orders   POCT HgB A1C (Completed)     Other   Central adiposity       Plan:  Continue current medications as directed. Discussed programs such as Weight Watchers to help with weight loss. Also discussed bariatric surgery.  Encouraged regular exercise such as walking.  Return in about 4 months (around 08/12/2018) for diabetes.

## 2018-05-11 ENCOUNTER — Other Ambulatory Visit: Payer: Self-pay | Admitting: Cardiology

## 2018-05-11 ENCOUNTER — Other Ambulatory Visit: Payer: Self-pay | Admitting: Family Medicine

## 2018-05-12 ENCOUNTER — Ambulatory Visit: Payer: BLUE CROSS/BLUE SHIELD | Admitting: Family Medicine

## 2018-05-12 ENCOUNTER — Encounter: Payer: Self-pay | Admitting: Family Medicine

## 2018-05-12 VITALS — BP 140/92 | Temp 98.3°F | Ht 63.5 in | Wt 255.2 lb

## 2018-05-12 DIAGNOSIS — R309 Painful micturition, unspecified: Secondary | ICD-10-CM | POA: Diagnosis not present

## 2018-05-12 DIAGNOSIS — N10 Acute tubulo-interstitial nephritis: Secondary | ICD-10-CM

## 2018-05-12 LAB — POCT URINALYSIS DIPSTICK
Spec Grav, UA: 1.01 (ref 1.010–1.025)
pH, UA: 6 (ref 5.0–8.0)

## 2018-05-12 MED ORDER — CIPROFLOXACIN HCL 500 MG PO TABS: 500.0000 mg | ORAL_TABLET | Freq: Two times a day (BID) | ORAL | 0 refills | Status: AC

## 2018-05-12 NOTE — Progress Notes (Signed)
   Subjective:    Patient ID: [name], female    DOB: May 18, 1966, 52 y.o.   MRN: [id number]  Urinary Tract Infection   This is a new problem. The current episode started in the past 7 days. Associated symptoms include flank pain, frequency and nausea. Associated symptoms comments: Painful urination. She has tried nothing for the symptoms.   Pt needs a note for work  Results for orders placed or performed in visit on 05/12/18  POCT Urinalysis Dipstick  Result Value Ref Range   Color, UA     Clarity, UA     Glucose, UA  Negative   Bilirubin, UA     Ketones, UA     Spec Grav, UA 1.010 1.010 - 1.025   Blood, UA     pH, UA 6.0 5.0 - 8.0   Protein, UA  Negative   Urobilinogen, UA  0.2 or 1.0 E.U./dL   Nitrite, UA     Leukocytes, UA Small (1+) (A) Negative   Appearance     Odor     Increased freq, pos nocturia, pos dysuria   Noting pain in back and side   Felt nauseated , energy level not the best      Review of Systems  Gastrointestinal: Positive for nausea.  Genitourinary: Positive for flank pain and frequency.       Objective:   Physical Exam  Alert vitals stable, NAD. Blood pressure good on repeat. HEENT normal. Lungs clear. Heart regular rate and rhythm. Positive left CVA tenderness  Urinalysis several white blood cells per high-power field no epithelials      Assessment & Plan:  1 impression urinary tract infection.  Likely element of pyelonephritis.  Has some mild achy feeling at this point.  Cipro twice daily x10 days symptom care discussed warning signs discussed.

## 2018-05-26 ENCOUNTER — Telehealth: Payer: Self-pay | Admitting: Family Medicine

## 2018-05-26 ENCOUNTER — Other Ambulatory Visit: Payer: Self-pay | Admitting: *Deleted

## 2018-05-26 MED ORDER — NITROFURANTOIN MONOHYD MACRO 100 MG PO CAPS: 100.0000 mg | ORAL_CAPSULE | Freq: Two times a day (BID) | ORAL | 0 refills | Status: DC

## 2018-05-26 NOTE — Telephone Encounter (Signed)
macrobid 100 bid for ten d

## 2018-05-26 NOTE — Telephone Encounter (Signed)
Med sent to pharm. Pt notified.  

## 2018-05-26 NOTE — Telephone Encounter (Signed)
Patient seen Dr. Richardson Landry on 05/12/18 for UTI.  She completed the Cipro.  She is still having urinary frequency and some painful urination.  Please advise.  [facility] [employer]

## 2018-05-26 NOTE — Telephone Encounter (Signed)
Pt states symptoms never cleared up while on cipro. Got a little better but not much. No fever, low abd pain, low back pain, frequent urination and dysuria.

## 2018-05-28 ENCOUNTER — Emergency Department (HOSPITAL_COMMUNITY): Payer: BLUE CROSS/BLUE SHIELD

## 2018-05-28 ENCOUNTER — Other Ambulatory Visit: Payer: Self-pay

## 2018-05-28 ENCOUNTER — Encounter (HOSPITAL_COMMUNITY): Payer: Self-pay | Admitting: Emergency Medicine

## 2018-05-28 ENCOUNTER — Emergency Department (HOSPITAL_COMMUNITY)
Admission: EM | Admit: 2018-05-28 | Discharge: 2018-05-28 | Disposition: A | Discharge status: Home / Self Care | Payer: BLUE CROSS/BLUE SHIELD | Attending: Emergency Medicine | Admitting: Emergency Medicine

## 2018-05-28 ENCOUNTER — Telehealth: Payer: Self-pay | Admitting: *Deleted

## 2018-05-28 ENCOUNTER — Emergency Department (HOSPITAL_COMMUNITY)
Admission: EM | Admit: 2018-05-28 | Discharge: 2018-05-28 | Disposition: A | Discharge status: Home / Self Care | Payer: Self-pay

## 2018-05-28 DIAGNOSIS — R109 Unspecified abdominal pain: Secondary | ICD-10-CM

## 2018-05-28 DIAGNOSIS — Z79899 Other long term (current) drug therapy: Secondary | ICD-10-CM | POA: Diagnosis not present

## 2018-05-28 DIAGNOSIS — R112 Nausea with vomiting, unspecified: Secondary | ICD-10-CM | POA: Insufficient documentation

## 2018-05-28 DIAGNOSIS — R1011 Right upper quadrant pain: Secondary | ICD-10-CM | POA: Diagnosis present

## 2018-05-28 DIAGNOSIS — R509 Fever, unspecified: Secondary | ICD-10-CM | POA: Diagnosis not present

## 2018-05-28 DIAGNOSIS — E119 Type 2 diabetes mellitus without complications: Secondary | ICD-10-CM | POA: Diagnosis not present

## 2018-05-28 DIAGNOSIS — I10 Essential (primary) hypertension: Secondary | ICD-10-CM | POA: Diagnosis not present

## 2018-05-28 DIAGNOSIS — Z7984 Long term (current) use of oral hypoglycemic drugs: Secondary | ICD-10-CM | POA: Diagnosis not present

## 2018-05-28 DIAGNOSIS — R531 Weakness: Secondary | ICD-10-CM | POA: Insufficient documentation

## 2018-05-28 DIAGNOSIS — Z853 Personal history of malignant neoplasm of breast: Secondary | ICD-10-CM | POA: Insufficient documentation

## 2018-05-28 LAB — URINALYSIS, ROUTINE W REFLEX MICROSCOPIC
Bilirubin Urine: NEGATIVE
Glucose, UA: NEGATIVE mg/dL
Hgb urine dipstick: NEGATIVE
Ketones, ur: NEGATIVE mg/dL
Leukocytes, UA: NEGATIVE
Nitrite: NEGATIVE
Protein, ur: NEGATIVE mg/dL
Specific Gravity, Urine: 1.006 (ref 1.005–1.030)
pH: 6 (ref 5.0–8.0)

## 2018-05-28 LAB — LIPASE, BLOOD: Lipase: 30 U/L (ref 11–51)

## 2018-05-28 LAB — COMPREHENSIVE METABOLIC PANEL
ALT: 20 U/L (ref 0–44)
AST: 18 U/L (ref 15–41)
Albumin: 4 g/dL (ref 3.5–5.0)
Alkaline Phosphatase: 64 U/L (ref 38–126)
Anion gap: 8 (ref 5–15)
BUN: 10 mg/dL (ref 6–20)
CO2: 30 mmol/L (ref 22–32)
Calcium: 8.8 mg/dL — ABNORMAL LOW (ref 8.9–10.3)
Chloride: 99 mmol/L (ref 98–111)
Creatinine, Ser: 0.85 mg/dL (ref 0.44–1.00)
GFR calc Af Amer: >60 mL/min (ref >60)
GFR calc non Af Amer: >60 mL/min (ref >60)
Glucose, Bld: 93 mg/dL (ref 70–99)
Potassium: 3.6 mmol/L (ref 3.5–5.1)
Sodium: 137 mmol/L (ref 135–145)
Total Bilirubin: 0.7 mg/dL (ref 0.3–1.2)
Total Protein: 7.5 g/dL (ref 6.5–8.1)

## 2018-05-28 LAB — CBC
HCT: 38.6 % (ref 36.0–46.0)
Hemoglobin: 12 g/dL (ref 12.0–15.0)
MCH: 26.8 pg (ref 26.0–34.0)
MCHC: 31.1 g/dL (ref 30.0–36.0)
MCV: 86.4 fL (ref 78.0–100.0)
Platelets: 164 10*3/uL (ref 150–400)
RBC: 4.47 MIL/uL (ref 3.87–5.11)
RDW: 15.7 % — ABNORMAL HIGH (ref 11.5–15.5)
WBC: 5.9 10*3/uL (ref 4.0–10.5)

## 2018-05-28 LAB — D-DIMER, QUANTITATIVE: D-Dimer, Quant: 0.59 ug/mL-FEU — ABNORMAL HIGH (ref 0.00–0.50)

## 2018-05-28 MED ORDER — MELOXICAM 7.5 MG PO TABS: 15.0000 mg | ORAL_TABLET | Freq: Every day | ORAL | 0 refills | Status: DC

## 2018-05-28 MED ORDER — FENTANYL CITRATE (PF) 100 MCG/2ML IJ SOLN
100.0000 ug | Freq: Once | INTRAMUSCULAR | Status: AC
Start: 2018-05-28 — End: 2018-05-28
  Administered 2018-05-28: 100 ug via INTRAVENOUS
  Filled 2018-05-28: qty 2

## 2018-05-28 MED ORDER — SODIUM CHLORIDE 0.9 % IV BOLUS
1000.0000 mL | Freq: Once | INTRAVENOUS | Status: AC
  Administered 2018-05-28: 1000 mL via INTRAVENOUS

## 2018-05-28 MED ORDER — SODIUM CHLORIDE 0.9 % IV BOLUS
500.0000 mL | Freq: Once | INTRAVENOUS | Status: AC
  Administered 2018-05-28: 500 mL via INTRAVENOUS

## 2018-05-28 MED ORDER — ACETAMINOPHEN 325 MG PO TABS
650.0000 mg | ORAL_TABLET | Freq: Once | ORAL | Status: AC
  Administered 2018-05-28: 650 mg via ORAL
  Filled 2018-05-28: qty 2

## 2018-05-28 MED ORDER — ONDANSETRON 4 MG PO TBDP
4.0000 mg | ORAL_TABLET | Freq: Once | ORAL | Status: AC
  Administered 2018-05-28: 4 mg via ORAL
  Filled 2018-05-28: qty 1

## 2018-05-28 MED ORDER — IOPAMIDOL (ISOVUE-300) INJECTION 61%
100.0000 mL | Freq: Once | INTRAVENOUS | Status: AC | PRN
  Administered 2018-05-28: 100 mL via INTRAVENOUS

## 2018-05-28 MED ORDER — FENTANYL CITRATE (PF) 100 MCG/2ML IJ SOLN
100.0000 ug | Freq: Once | INTRAMUSCULAR | Status: AC
  Administered 2018-05-28: 100 ug via INTRAVENOUS
  Filled 2018-05-28: qty 2

## 2018-05-28 NOTE — ED Provider Notes (Signed)
[facility] EMERGENCY DEPARTMENT Provider Note   CSN: [id number] Arrival date & time: 05/28/18  1142     History   Chief Complaint Chief Complaint  Patient presents with  . Flank Pain    HPI [name] is a 52 y.o. female.  52 y/o female with a PMH HTN,HLD, DM presents to the ED complaining of abdominal pain, nausea and vomiting since last night. Patient describes the abdominal pain as crampy radiating to her back in the epigastric and right upper quadrant region. Patient was recently treated for a UTI by her PCP and finished a course of antibiotics (cipro). Patient called in to PCP office  yesterday because she had no relieve in symptoms and prescribed nitrofurantoin over the phone.She has taken 3 tablets of nitrofurantoin and has vomited x4 today. Patient feels weak, sick and feverish at this time.She also admits to shortness of breath the past two days at work. She denies any chest pain,or current urinary symptoms.      Past Medical History:  Diagnosis Date  . Anxiety   . Blood transfusion without reported diagnosis 1999   due to heavy menses  . Breast cancer ([place]) 05/22/2011   03/01/11, Stage 2, s/p lumpectomy, chemo/xrt  . DDD (degenerative disc disease), lumbar   . Depression 06/22/2011  . Diverticulitis   . DM (diabetes mellitus) ([place]) 06/22/2011  . Genital warts   . GERD (gastroesophageal reflux disease) 06/22/2011  . Hx of adenomatous colonic polyps 10/2007   85m sigmoid tubular adenoma, FH colon cancer, mother in mid-50s  . Hypercholesterolemia 06/22/2011  . Hypertension 06/22/2011  . 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[place] 05/22/2011  . Iron deficiency anemia 03/25/2015  . Morbid obesity ([place]   . Shortness of breath   . 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 Vaginal bleeding 03/08/2014    Patient Active Problem List   Diagnosis Date Noted  . Central adiposity 04/12/2018  . Seasonal affective disorder ([place] 12/15/2017    . Chest pain 02/27/2017  . Rectal bleeding 09/06/2016  . Abdominal cramping 09/06/2016  . Fatty liver 09/06/2016  . Dehydration 12/15/2015  . Orthostatic dizziness 12/15/2015  . Nausea vomiting and diarrhea 12/15/2015  . Iron deficiency anemia 03/25/2015  . Excessive or frequent menstruation 04/08/2014  . Dysmenorrhea 04/08/2014  . H/O adenomatous polyp of colon 01/24/2012  . FH: colon cancer 01/24/2012  . Esophageal dysphagia 01/24/2012  . Chronic diarrhea 01/24/2012  . Depression 06/22/2011  . GERD (gastroesophageal reflux disease) 06/22/2011  . DM (diabetes mellitus) ([place] 06/22/2011  . Hypercholesterolemia 06/22/2011  . Hypertension 06/22/2011  . Obesity 06/22/2011  . 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[place] 05/22/2011    Past Surgical History:  Procedure Laterality Date  . back surg x2    . BREAST LUMPECTOMY Right 2012  . CHOLECYSTECTOMY    . COLONOSCOPY  11/03/07   4-mm sessile polyp removed/small internal hemorrhoids/tubular adenoma, random colon bx negative for microscopic colitis  . COLONOSCOPY WITH PROPOFOL N/A 09/17/2016   Procedure: COLONOSCOPY WITH PROPOFOL;  Surgeon: RDaneil Dolin MD;  [facility];  Service: Endoscopy;  Laterality: N/A;  9:00 am  . COLONOSCOPY, ESOPHAGOGASTRODUODENOSCOPY (EGD) AND ESOPHAGEAL DILATION  01/2012   SLF: empiric esophageal dilation, gastritis with benign bx, hemorrhoids  . DILATATION & CURETTAGE/HYSTEROSCOPY WITH MYOSURE N/A 02/10/2015   Procedure: DILATATION & CURETTAGE/HYSTEROSCOPY WITH MYOSURE;  Surgeon: BNunzio Cobbs MD;  [facility];  Service: Gynecology;  Laterality: N/A;  . MM BREAST STEREO BX*L*R/S     rt.  Marland Kitchen POLYPECTOMY  09/17/2016   Procedure: POLYPECTOMY;  Surgeon: Daneil Dolin, MD;  Location: [facility] END[facility] SU[facility];  Service: Endoscopy;;  ascending colon  . PORT-A-CATH REMOVAL  05/26/2012   Procedure: REMOVAL PORT-A-CATH;  Surgeon: Jamesetta So, MD;  Location: [facility] ORS;  Service: General;   Laterality: N/A;  Minor Room  . PORTACATH PLACEMENT       OB History    Gravida  4   Para  3   Term  0   Preterm  0   AB  1   Living  3     SAB  0   TAB  0   Ectopic  0   Multiple  0   Live Births  3            Home Medications    Prior to Admission medications   Medication Sig Start Date End Date Taking? Authorizing Provider  albuterol (PROVENTIL HFA;VENTOLIN HFA) 108 (90 Base) MCG/ACT inhaler Inhale 2 puffs into the lungs every 6 (six) hours as needed for wheezing or shortness of breath. 11/14/17   Mikey Kirschner, MD  atorvastatin (LIPITOR) 40 MG tablet TAKE 1 TABLET BY MOUTH ONCE DAILY **PATIENT  NEEDS  FOLLOW  UP  APPOINTMENT  FOR  FUTURE  REFILLS** 05/12/18   Arnoldo Lenis, MD  blood glucose meter kit and supplies KIT Dispense based on patient and insurance preference. TEST ONCE DAILY. DIABETES TYPE 2 ICD 10 CODE E11.9 12/07/16   Mikey Kirschner, MD  citalopram (CELEXA) 40 MG tablet Take 1 tablet (40 mg total) by mouth daily. 12/12/17   Nilda Simmer, NP  lisinopril (PRINIVIL,ZESTRIL) 10 MG tablet TAKE 1 TABLET BY MOUTH ONCE DAILY 05/12/18   Mikey Kirschner, MD  metFORMIN (GLUCOPHAGE) 500 MG tablet Take 1 tablet (500 mg total) by mouth 2 (two) times daily. 12/12/17   Nilda Simmer, NP  nitrofurantoin, macrocrystal-monohydrate, (MACROBID) 100 MG capsule Take 1 capsule (100 mg total) by mouth 2 (two) times daily. 05/26/18   Mikey Kirschner, MD  pantoprazole (PROTONIX) 40 MG tablet TAKE 1 TABLET BY MOUTH ONCE DAILY FOR  ACID  REFLUX 12/12/17   Nilda Simmer, NP  potassium chloride (KCL) 2 mEq/mL SOLN oral liquid Take 10 mL BID for 21 days, then 10 mL daily, PO 09/21/11 01/28/12  Baird Cancer, PA-C    Family History Family History  Problem Relation Age of Onset  . Colon cancer Mother        mid-50s, died of metastatic disease  . Cancer Mother        liver  . Coronary artery disease Mother   . Diabetes type I Mother   . Peripheral  vascular disease Mother        Carotid disease in her 51s  . Coronary artery disease Father        CAD in his 3s  . Diabetes Father   . Diabetes type I Father   . Hypertension Father   . Bipolar disorder Sister   . Coronary artery disease Brother        MI in his 44s  . Hypertension Brother   . Hypertension Maternal Grandmother   . Hyperlipidemia Maternal Grandmother   . Stroke Maternal Grandmother   . Hypertension Maternal Grandfather   . Diabetes Maternal Grandfather   . Diabetes Paternal Grandmother   . Heart disease Paternal Grandfather  Social History Social History   Tobacco Use  . Smoking status: Never Smoker  . Smokeless tobacco: Never Used  Substance Use Topics  . Alcohol use: No    Alcohol/week: 0.0 oz  . Drug use: No     Allergies   Flagyl [metronidazole] and Pravastatin   Review of Systems Review of Systems  Constitutional: Positive for fever.  Respiratory: Negative for shortness of breath.   Cardiovascular: Negative for chest pain and palpitations.  Gastrointestinal: Positive for nausea and vomiting (4 episodes this morning). Negative for abdominal pain, constipation and diarrhea.  Genitourinary: Positive for flank pain. Negative for dysuria and hematuria.  Musculoskeletal: Negative for back pain.  Neurological: Negative for weakness, light-headedness and headaches.  All other systems reviewed and are negative.    Physical Exam Updated Vital Signs BP (!) 156/99 (BP Location: Left Arm)   Pulse 93   Temp 100.3 F (37.9 C) (Oral)   Resp 18   Ht 5' 3.5" (1.613 m)   Wt 115.7 kg (255 lb)   LMP 04/05/2016 Comment: spotting 11/2016 and 12/2016  SpO2 97%   BMI 44.46 kg/m   Physical Exam  Constitutional: She is oriented to person, place, and time. She appears well-developed and well-nourished.  HENT:  Head: Normocephalic and atraumatic.  Cardiovascular: Normal heart sounds.  Pulmonary/Chest: Effort normal and breath sounds normal. She has no  wheezes.  Abdominal: Soft. Normal appearance. She exhibits no distension. Bowel sounds are decreased. There is tenderness in the right upper quadrant and epigastric area. There is no rebound and no guarding.  Musculoskeletal: She exhibits no tenderness or deformity.  Neurological: She is alert and oriented to person, place, and time.  Skin: Skin is warm and dry.  Nursing note and vitals reviewed.    ED Treatments / Results  Labs (all labs ordered are listed, but only abnormal results are displayed) Labs Reviewed  COMPREHENSIVE METABOLIC PANEL - Abnormal; Notable for the following components:      Result Value   Calcium 8.8 (*)    All other components within normal limits  CBC - Abnormal; Notable for the following components:   RDW 15.7 (*)    All other components within normal limits  LIPASE, BLOOD  URINALYSIS, ROUTINE W REFLEX MICROSCOPIC    EKG None  Radiology No results found.  Procedures Procedures (including critical care time)  Medications Ordered in ED Medications - No data to display   Initial Impression / Assessment and Plan / ED Course  I have reviewed the triage vital signs and the nursing notes.  Pertinent labs & imaging results that were available during my care of the patient were reviewed by me and considered in my medical decision making (see chart for details).  Clinical Course as of May 28 2117  Wed May 28, 2018  1935 D-dimer, quantitative (not at [facility]) [JS]  2114 D-dimer, quantitative (not at [facility]) [JS]    Clinical Course User Index [JS] Janeece Fitting, PA-C    Patient presents to the ED complaining of abdominal pain since last night.Patient reports nausea and 4 episodes of emesis since last night.She treated by her PCP for a UTI 2 weeks ago.Patient completed course of antibiotics and stated she had no relieve in symptoms so she called PCP who prescribed nitrofurantoin for her over the phone. Patient feels worse today and states she feels sicker  than before. She denies any dysuria.  Patient has a history of HLD,DM and HTN her epigastric pain is reproducible with palpation but  will obtain EKG to r/o any ACS. Labs results show normal lipase and LFTs. Pain is mainly in the epigastric region will order [place] ABD to rule any abdominal abnormalities, as patient is currently febrile. Urinalysis showed no WBC and she denies any urinary symptoms.  Patient still in significant pain, given fentanyl for pain relief. Pain waiting on CT Abd& Pelvis to r/o any infarction or infection.  CT results showed:  1. No acute process identified. 2. Bilateral kidney nonobstructive punctate nephrolithiasis. No hydronephrosis or ureter stone. 3. Small hiatal hernia. 4. Hepatic steatosis. 5. Stable small duodenum diverticula. 6. Aortic atherosclerosis.  Patient still febrile will give tylenol for fever. Patient mention to attending physician finding a tick on her hair. Considering any tick borne illness at this time.Will first order d-dimer to r/o any DVT or PE present.  8:37 PM D-dimer collected by nurse, patient still in pain, medication ordered.  10:04 PM Spoke to RN about D-dimer, she states the machine has been down for the past two hours. 10:49 PM D dimer results negative, will have patient follow up with PCP in a week. I have provided patient with return precautions. Dr. Sabra Heck has examined and seen this patient with me. No further emergent workup at this time.     Final Clinical Impressions(s) / ED Diagnoses   Final diagnoses:  None    ED Discharge Orders    None       Janeece Fitting, PA-C 05/28/18 2250    Noemi Chapel, MD 05/30/18 [phone]

## 2018-05-28 NOTE — Telephone Encounter (Signed)
Pt had antibiotic called in yesterday for UTI. Pt states she feels worse today. Flushed, sob, vomiting, dizzy like she is going to pass out. Advised pt to go to ED. She states she will go to [facility] ED.

## 2018-05-28 NOTE — ED Notes (Addendum)
Pt instructed to take meclazine or dramamine OTC for nausea if needed per recommendation of [place], PA.

## 2018-05-28 NOTE — ED Triage Notes (Signed)
Treated 2 weeks ago for UTI, finished medication, but not feeling any better. Called pcp yesterday and was put on another antibiotic yesterday. Last night onset of vomiting, frequently voiding, back pain. Starting to feel dizzy today.

## 2018-05-28 NOTE — ED Provider Notes (Signed)
Medical screening examination/treatment/procedure(s) were conducted as a shared visit with non-physician practitioner(s) and myself.  I personally evaluated the patient during the encounter.  Clinical Impression:   Final diagnoses:  Right flank pain  Nausea and vomiting, intractability of vomiting not specified, unspecified vomiting type    The patient is a 52 year old female, presents with abdominal discomfort with vomiting, took a recent course of antibiotics for urinary infection however called her doctor yesterday and was given Macrobid over the phone, she had nausea and vomiting after taking that medication and comes in with some right flank and abdominal pain.  The patient does not have a gallbladder, she has a CT scan showing no acute findings and on my exam other than being obese with minimal tenderness there is no other acute findings.  Lungs are clear, she is not tachycardic or hypoxic, labs are reassuring including no leukocytosis and a borderline d-dimer which is unlikely to be representative of an underlying pulmonary embolus.  The patient will be given an anti-inflammatory, she was encouraged to come back if things get worse, she expressed her understanding.  I personally went over her results with her and her significant other at the bedside with her permission.   Noemi Chapel, MD 05/30/18 [phone]

## 2018-05-28 NOTE — Discharge Instructions (Addendum)
Your testing today has not shown any specific findings, there was no signs of active kidney stones or infections, please seek medical exam for severe or worsening symptoms, take Mobic twice a day for the next 7 days and follow-up with Dr. Wolfgang Phoenix within 2 days for a recheck.  Emergency department for ongoing pain vomiting or fevers

## 2018-05-28 NOTE — Telephone Encounter (Signed)
Correct response 

## 2018-05-30 ENCOUNTER — Telehealth: Payer: Self-pay | Admitting: Obstetrics and Gynecology

## 2018-05-30 ENCOUNTER — Encounter: Payer: Self-pay | Admitting: Obstetrics and Gynecology

## 2018-05-30 ENCOUNTER — Other Ambulatory Visit (HOSPITAL_COMMUNITY)
Admission: RE | Admit: 2018-05-30 | Discharge: 2018-05-30 | Disposition: A | Discharge status: Home / Self Care | Payer: BLUE CROSS/BLUE SHIELD | Source: Ambulatory Visit | Attending: Obstetrics and Gynecology | Admitting: Obstetrics and Gynecology

## 2018-05-30 ENCOUNTER — Other Ambulatory Visit: Payer: Self-pay

## 2018-05-30 ENCOUNTER — Ambulatory Visit: Payer: BLUE CROSS/BLUE SHIELD | Admitting: Obstetrics and Gynecology

## 2018-05-30 VITALS — BP 138/92 | HR 92 | Temp 98.2°F | Resp 14 | Ht 63.5 in | Wt 251.0 lb

## 2018-05-30 DIAGNOSIS — Z113 Encounter for screening for infections with a predominantly sexual mode of transmission: Secondary | ICD-10-CM

## 2018-05-30 DIAGNOSIS — R350 Frequency of micturition: Secondary | ICD-10-CM | POA: Diagnosis not present

## 2018-05-30 DIAGNOSIS — N760 Acute vaginitis: Secondary | ICD-10-CM

## 2018-05-30 LAB — POCT URINALYSIS DIPSTICK
Bilirubin, UA: NEGATIVE
Glucose, UA: NEGATIVE
Ketones, UA: NEGATIVE
Nitrite, UA: NEGATIVE
Protein, UA: POSITIVE — AB
Urobilinogen, UA: 0.2 E.U./dL
pH, UA: 6 (ref 5.0–8.0)

## 2018-05-30 MED ORDER — FLUCONAZOLE 150 MG PO TABS: 150.0000 mg | ORAL_TABLET | Freq: Once | ORAL | 0 refills | Status: AC

## 2018-05-30 NOTE — Progress Notes (Signed)
GYNECOLOGY  VISIT   HPI: 52 y.o.   Divorced  Caucasian  female   [phone] with Patient's last menstrual period was 04/05/2016.   here for frequent urination, vaginal discharge (cream color). Patient states that she had a temperature of 100.7 on Wednesday night.     Seen by PCP 2 weeks prior and treated for UTI with a 10 day course of abx.  Did not feel better, and she was switched to [place].  She developed nausea and weakness.  Went to the ER for evaluation on 05/28/18 for right flank pain, abdominal pain and vomiting.  Had a negative urine dip.  CT scan showed small bilateral renal stones.   Vomiting last night.  Not feeling well in general.   Voiding often now.  No dysuria per se.  Has a burning inside.   Some vaginal discharge and odor.   States decreased desire for sex.  Has a lot on her plate right now.   Urine: trace RBC, trace WBC, trace Protein  GYNECOLOGIC HISTORY: Patient's last menstrual period was 04/05/2016. Contraception: postmenopausal Menopausal hormone therapy:  none Last mammogram:  03/25/18 BIRADS 0:Incomplete; 04/01/18 Left Breast MM/US BIRADS 2 Benign/density b Last pap smear:   01/31/18 Pap and HR HPV negative        OB History    Gravida  4   Para  3   Term  0   Preterm  0   AB  1   Living  3     SAB  0   TAB  0   Ectopic  0   Multiple  0   Live Births  3              Patient Active Problem List   Diagnosis Date Noted  . Central adiposity 04/12/2018  . Seasonal affective disorder ([place]) 12/15/2017  . Chest pain 02/27/2017  . Rectal bleeding 09/06/2016  . Abdominal cramping 09/06/2016  . Fatty liver 09/06/2016  . Dehydration 12/15/2015  . Orthostatic dizziness 12/15/2015  . Nausea vomiting and diarrhea 12/15/2015  . Iron deficiency anemia 03/25/2015  . Excessive or frequent menstruation 04/08/2014  . Dysmenorrhea 04/08/2014  . H/O adenomatous polyp of colon 01/24/2012  . FH: colon cancer 01/24/2012  . Esophageal  dysphagia 01/24/2012  . Chronic diarrhea 01/24/2012  . Depression 06/22/2011  . GERD (gastroesophageal reflux disease) 06/22/2011  . DM (diabetes mellitus) ([place]) 06/22/2011  . Hypercholesterolemia 06/22/2011  . Hypertension 06/22/2011  . Obesity 06/22/2011  . 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[place]) 05/22/2011    Past Medical History:  Diagnosis Date  . Anxiety   . Blood transfusion without reported diagnosis 1999   due to heavy menses  . Breast cancer ([place]) 05/22/2011   03/01/11, Stage 2, s/p lumpectomy, chemo/xrt  . DDD (degenerative disc disease), lumbar   . Depression 06/22/2011  . Diverticulitis   . DM (diabetes mellitus) ([place]) 06/22/2011  . Genital warts   . GERD (gastroesophageal reflux disease) 06/22/2011  . Hx of adenomatous colonic polyps 10/2007   63m sigmoid tubular adenoma, FH colon cancer, mother in mid-50s  . Hypercholesterolemia 06/22/2011  . Hypertension 06/22/2011  . 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[place] 05/22/2011  . Iron deficiency anemia 03/25/2015  . Morbid obesity ([place]   . Shortness of breath   . 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 Vaginal bleeding 03/08/2014    Past Surgical History:  Procedure Laterality Date  . back  surg x2    . BREAST LUMPECTOMY Right 2012  . CHOLECYSTECTOMY    . COLONOSCOPY  11/03/07   4-mm sessile polyp removed/small internal hemorrhoids/tubular adenoma, random colon bx negative for microscopic colitis  . COLONOSCOPY WITH PROPOFOL N/A 09/17/2016   Procedure: COLONOSCOPY WITH PROPOFOL;  Surgeon: Daneil Dolin, MD;  Location[facility];  Service: Endoscopy;  Laterality: N/A;  9:00 am  . COLONOSCOPY, ESOPHAGOGASTRODUODENOSCOPY (EGD) AND ESOPHAGEAL DILATION  01/2012   SLF: empiric esophageal dilation, gastritis with benign bx, hemorrhoids  . DILATATION & CURETTAGE/HYSTEROSCOPY WITH MYOSURE N/A 02/10/2015   Procedure: DILATATION & CURETTAGE/HYSTEROSCOPY WITH MYOSURE;  Surgeon: Nunzio Cobbs, MD;  Location: [facility];  Service: Gynecology;  Laterality: N/A;  . MM BREAST STEREO BX*L*R/S     rt.  Marland Kitchen POLYPECTOMY  09/17/2016   Procedure: POLYPECTOMY;  Surgeon: Daneil Dolin, MD;  [facility];  Service: Endoscopy;;  ascending colon  . PORT-A-CATH REMOVAL  05/26/2012   Procedure: REMOVAL PORT-A-CATH;  Surgeon: Jamesetta So, MD;  Location: [facility] OR[facility];  Service: General;  Laterality: N/A;  Minor Room  . PORTACATH PLACEMENT      Current Outpatient Medications  Medication Sig Dispense Refill  . albuterol (PROVENTIL HFA;VENTOLIN HFA) 108 (90 Base) MCG/ACT inhaler Inhale 2 puffs into the lungs every 6 (six) hours as needed for wheezing or shortness of breath. 1 Inhaler 2  . atorvastatin (LIPITOR) 40 MG tablet TAKE 1 TABLET BY MOUTH ONCE DAILY **PATIENT  NEEDS  FOLLOW  UP  APPOINTMENT  FOR  FUTURE  REFILLS** 15 tablet 0  . blood glucose meter kit and supplies KIT Dispense based on patient and insurance preference. TEST ONCE DAILY. DIABETES TYPE 2 ICD 10 CODE E11.9 1 each 5  . citalopram (CELEXA) 40 MG tablet Take 1 tablet (40 mg total) by mouth daily. 90 tablet 1  . lisinopril (PRINIVIL,ZESTRIL) 10 MG tablet TAKE 1 TABLET BY MOUTH ONCE DAILY 90 tablet 1  . metFORMIN (GLUCOPHAGE) 500 MG tablet Take 1 tablet (500 mg total) by mouth 2 (two) times daily. 180 tablet 1  . pantoprazole (PROTONIX) 40 MG tablet TAKE 1 TABLET BY MOUTH ONCE DAILY FOR  ACID  REFLUX 90 tablet 1   No current facility-administered medications for this visit.      ALLERGIES: Flagyl [metronidazole] and Pravastatin  Family History  Problem Relation Age of Onset  . Colon cancer Mother        mid-50s, died of metastatic disease  . Cancer Mother        liver  . Coronary artery disease Mother   . Diabetes type I Mother   . Peripheral vascular disease Mother        Carotid disease in her 24s  . Coronary artery disease Father        CAD in his 74s  . Diabetes Father   . Diabetes type I Father   .  Hypertension Father   . Bipolar disorder Sister   . Coronary artery disease Brother        MI in his 89s  . Hypertension Brother   . Hypertension Maternal Grandmother   . Hyperlipidemia Maternal Grandmother   . Stroke Maternal Grandmother   . Hypertension Maternal Grandfather   . Diabetes Maternal Grandfather   . Diabetes Paternal Grandmother   . Heart disease Paternal Grandfather     Social History   Socioeconomic History  . Marital status: Divorced    Spouse name: Not on file  .  Number of children: 3  . Years of education: Not on file  . Highest education level: Not on file  Occupational History  . Occupation: child care    Employer: [employer]  Social Needs  . Financial resource strain: Not on file  . Food insecurity:    Worry: Not on file    Inability: Not on file  . Transportation needs:    Medical: Not on file    Non-medical: Not on file  Tobacco Use  . Smoking status: Never Smoker  . Smokeless tobacco: Never Used  Substance and Sexual Activity  . Alcohol use: No    Alcohol/week: 0.0 oz  . Drug use: No  . Sexual activity: Yes    Partners: Male    Birth control/protection: None  Lifestyle  . Physical activity:    Days per week: Not on file    Minutes per session: Not on file  . Stress: Not on file  Relationships  . Social connections:    Talks on phone: Not on file    Gets together: Not on file    Attends religious service: Not on file    Active member of club or organization: Not on file    Attends meetings of clubs or organizations: Not on file    Relationship status: Not on file  . Intimate partner violence:    Fear of current or ex partner: Not on file    Emotionally abused: Not on file    Physically abused: Not on file    Forced sexual activity: Not on file  Other Topics Concern  . Not on file  Social History Narrative  . Not on file    Review of Systems  Constitutional: Negative.   HENT: Negative.   Eyes: Negative.    Respiratory: Negative.   Cardiovascular: Negative.   Gastrointestinal: Negative.   Endocrine: Negative.   Genitourinary: Positive for frequency and vaginal discharge.       Loss of sexual interest Back pain Lower abdominal cramps   Musculoskeletal: Negative.   Skin: Negative.   Allergic/Immunologic: Negative.   Neurological: Negative.   Hematological: Negative.   Psychiatric/Behavioral: Negative.     PHYSICAL EXAMINATION:    BP (!) 138/92 (BP Location: Left Arm, Patient Position: Sitting, Cuff Size: Large)   Pulse 92   Temp 98.2 F (36.8 C) (Oral)   Resp 14   Ht 5' 3.5" (1.613 m)   Wt 251 lb (113.9 kg)   LMP 04/05/2016 Comment: spotting 11/2016 and 12/2016  BMI 43.77 kg/m     General appearance: alert, cooperative and appears stated age   Abdomen: soft, non-tender, no masses,  no organomegaly  Pelvic: External genitalia:  no lesions              Urethra:  normal appearing urethra with no masses, tenderness or lesions              Bartholins and Skenes: normal                 Vagina: normal appearing vagina with normal color and discharge, no lesions              Cervix: no lesions                Bimanual Exam:  Uterus:  normal size, contour, position, consistency, mobility, non-tender              Adnexa: no mass, fullness, tenderness  Rectal exam: Yes.  .  Confirms.              Anus:  normal sphincter tone, no lesions  Chaperone was present for exam.  ASSESSMENT  Recent UTI. Renal stones - small. Yeast vaginitis.  STD screening.  Decreased libido.  Hx diverticulitis.  Normal CT scan and WBC.   Malaise.   PLAN  Urine micro and cx.  GC/CT/trich/BV/yeast check.  Diflucan 150 mg po x 1.  May repeat in 72 hours.  She will discuss her libido change with her partner.  She will follow up with her PCP if her symptoms of malaise do not improve.   An After Visit Summary was printed and given to the patient.  ____15__ minutes face to face time of  which over 50% was spent in counseling.

## 2018-05-30 NOTE — Telephone Encounter (Signed)
Patient called requesting an appointment today for concerns about discharge and cramping. Patient said she doesn't usually have cramping because she is pre-menopausal.  Last seen: 01/31/18

## 2018-05-30 NOTE — Telephone Encounter (Signed)
Call to patient. Requests appointment for evaluation of vaginal discharge. Reports yellow discharge with odor and burning. Has had fever of 100,7 but had other issues that were evaluated in ER.  Appointment at 145  Today for evaluation of vaginitis.   Routing to provider for review.  Will close encounter.

## 2018-05-30 NOTE — Patient Instructions (Signed)

## 2018-05-31 LAB — URINALYSIS, MICROSCOPIC ONLY
Casts: NONE SEEN /lpf
Epithelial Cells (non renal): >10 /hpf — AB (ref 0–10)

## 2018-05-31 LAB — URINE CULTURE

## 2018-06-02 ENCOUNTER — Other Ambulatory Visit: Payer: Self-pay | Admitting: *Deleted

## 2018-06-02 LAB — CERVICOVAGINAL ANCILLARY ONLY
Bacterial vaginitis: POSITIVE — AB
Candida vaginitis: NEGATIVE
Chlamydia: NEGATIVE
Neisseria Gonorrhea: NEGATIVE
Trichomonas: NEGATIVE

## 2018-06-02 MED ORDER — CLINDAMYCIN PHOSPHATE 100 MG VA SUPP: 100.0000 mg | Freq: Every day | VAGINAL | 0 refills | Status: DC

## 2018-06-05 ENCOUNTER — Other Ambulatory Visit: Payer: Self-pay | Admitting: Cardiology

## 2018-06-05 ENCOUNTER — Telehealth: Payer: Self-pay | Admitting: Obstetrics and Gynecology

## 2018-06-05 MED ORDER — CLINDAMYCIN PHOSPHATE 2 % VA CREA: 1.0000 | TOPICAL_CREAM | Freq: Every day | VAGINAL | 0 refills | Status: DC

## 2018-06-05 NOTE — Telephone Encounter (Signed)
Dr. Quincy Simmonds,  Can clindamycin 2% vaginal cream with applicator be substituted for patient instead of the ovules?  Call to pharmacy. Clindamycin ovules require prior authorization. Not covered. Clindamycin Vaginal Cream 2% is covered.

## 2018-06-05 NOTE — Telephone Encounter (Signed)
Call to patient and advised of new prescription sent and instructions.  Verbalized understanding and will call back with any concerns.  Encounter closed.

## 2018-06-05 NOTE — Telephone Encounter (Signed)
Ok for clindamycin 2% cream1 Place 1 applicator full pv at hs x 3 nights.  Dispense:  40 gram RF none.

## 2018-06-05 NOTE — Telephone Encounter (Signed)
Patient was seen 05/30/18 and her prescription will not be covered by her insurance. Patient said "the pharmacy sent a request to our office and is waiting for a response".

## 2018-06-11 ENCOUNTER — Telehealth: Payer: Self-pay | Admitting: Family Medicine

## 2018-06-11 MED ORDER — ATORVASTATIN CALCIUM 40 MG PO TABS: ORAL_TABLET | ORAL | 5 refills | Status: DC

## 2018-06-11 NOTE — Telephone Encounter (Signed)
Reviewed chart that's reasonable 6 mo worth

## 2018-06-11 NOTE — Telephone Encounter (Signed)
Patient is requesting a refill on her Atorvastatin 40 mg.  This was being prescribed by Dr. Harl Bowie, but she doesn't want to see them anymore for this medication.  [name]

## 2018-06-11 NOTE — Telephone Encounter (Signed)
Prescription sent electronically to pharmacy. Patient notified. 

## 2018-06-24 ENCOUNTER — Telehealth: Payer: Self-pay | Admitting: *Deleted

## 2018-06-24 DIAGNOSIS — E119 Type 2 diabetes mellitus without complications: Secondary | ICD-10-CM

## 2018-06-24 DIAGNOSIS — H539 Unspecified visual disturbance: Secondary | ICD-10-CM

## 2018-06-24 NOTE — Telephone Encounter (Signed)
Pt dropped off letter from [facility] stating she needs eye exam. Pt states insurance does not cover eye exam. She states she is having some trouble seeing and insurance will cover if her doctor says she needs to see one. Does pt need appt here or can you just do referral. Letter in dr steve's folder.   Call pt back on [phone]

## 2018-06-26 NOTE — Telephone Encounter (Signed)
Lets do referral, s reason is type 2 diab plus vision abnormalities

## 2018-06-26 NOTE — Telephone Encounter (Signed)
Referral ordered in Epic. 

## 2018-07-03 ENCOUNTER — Encounter: Payer: Self-pay | Admitting: Family Medicine

## 2018-07-16 ENCOUNTER — Ambulatory Visit: Payer: BLUE CROSS/BLUE SHIELD | Admitting: Family Medicine

## 2018-07-16 ENCOUNTER — Encounter: Payer: Self-pay | Admitting: Family Medicine

## 2018-07-16 VITALS — BP 134/86 | Temp 98.9°F | Ht 63.5 in | Wt 250.0 lb

## 2018-07-16 DIAGNOSIS — K529 Noninfective gastroenteritis and colitis, unspecified: Secondary | ICD-10-CM

## 2018-07-16 DIAGNOSIS — J329 Chronic sinusitis, unspecified: Secondary | ICD-10-CM | POA: Diagnosis not present

## 2018-07-16 MED ORDER — ALBUTEROL SULFATE HFA 108 (90 BASE) MCG/ACT IN AERS: 2.0000 | INHALATION_SPRAY | Freq: Four times a day (QID) | RESPIRATORY_TRACT | 0 refills | Status: DC | PRN

## 2018-07-16 MED ORDER — CEFPROZIL 500 MG PO TABS: 500.0000 mg | ORAL_TABLET | Freq: Two times a day (BID) | ORAL | 0 refills | Status: DC

## 2018-07-16 MED ORDER — RANITIDINE HCL 300 MG PO TABS: 300.0000 mg | ORAL_TABLET | Freq: Every day | ORAL | 0 refills | Status: DC

## 2018-07-16 MED ORDER — ONDANSETRON 4 MG PO TBDP: 4.0000 mg | ORAL_TABLET | Freq: Three times a day (TID) | ORAL | 0 refills | Status: DC | PRN

## 2018-07-16 NOTE — Progress Notes (Signed)
   Subjective:    Patient ID: [name], female    DOB: [birth year], 52 y.o.   MRN: [id number]  Emesis   This is a new problem. Episode onset: 4 days. Associated symptoms include abdominal pain and diarrhea. Associated symptoms comments: nausea.   Ear pain, sore throat and coughing for 4 days.   Started with cough and cong and gunkiness  Pos prod asal disch   Some nausea and then got worse and worse wit vomiting  Has vomited twice today, several episodes of diarrhea   Review of Systems  Gastrointestinal: Positive for abdominal pain, diarrhea and vomiting.       Objective:   Physical Exam Alert, mild malaise. Hydration good Vitals stable. frontal/ maxillary tenderness evident positive nasal congestion. pharynx normal neck supple  lungs clear/no crackles or wheezes. heart regular in rhythm Abdomen positive mild epigastric tenderness.  Hyperactive bowel sounds       Assessment & Plan:  Impression rhinosinusitis with secondary gastric plan antibiotics prescribed symptom care discussed warning signs discussed.  Will add Zantac short-term./Zofran as needed for nausea

## 2018-08-07 ENCOUNTER — Ambulatory Visit: Payer: BLUE CROSS/BLUE SHIELD | Admitting: Family Medicine

## 2018-08-18 ENCOUNTER — Other Ambulatory Visit: Payer: Self-pay | Admitting: *Deleted

## 2018-08-18 MED ORDER — METFORMIN HCL 500 MG PO TABS: 500.0000 mg | ORAL_TABLET | Freq: Two times a day (BID) | ORAL | 0 refills | Status: DC

## 2018-08-18 MED ORDER — PANTOPRAZOLE SODIUM 40 MG PO TBEC: DELAYED_RELEASE_TABLET | ORAL | 0 refills | Status: DC

## 2018-08-25 ENCOUNTER — Telehealth: Payer: Self-pay | Admitting: Family Medicine

## 2018-08-25 NOTE — Telephone Encounter (Signed)
Pt is wanting to know if something can be called in or if she needs to come in and be seen. She thinks she has a UTI she has been going to the restroom frequently and it is keeping her up all night. If something is able to be called in please send to [place], [address]

## 2018-08-25 NOTE — Telephone Encounter (Signed)
Spoke with patient and informed patient that she would need to be seen. Pt verbalized understanding and transferred up front to make an appointment.

## 2018-08-26 ENCOUNTER — Encounter: Payer: Self-pay | Admitting: Family Medicine

## 2018-08-26 ENCOUNTER — Ambulatory Visit (INDEPENDENT_AMBULATORY_CARE_PROVIDER_SITE_OTHER): Payer: BLUE CROSS/BLUE SHIELD | Admitting: Family Medicine

## 2018-08-26 VITALS — Temp 98.9°F | Ht 63.5 in | Wt 250.0 lb

## 2018-08-26 DIAGNOSIS — N10 Acute tubulo-interstitial nephritis: Secondary | ICD-10-CM | POA: Diagnosis not present

## 2018-08-26 DIAGNOSIS — M791 Myalgia, unspecified site: Secondary | ICD-10-CM | POA: Diagnosis not present

## 2018-08-26 LAB — POCT URINALYSIS DIPSTICK
PH UA: 5 (ref 5.0–8.0)
Spec Grav, UA: 1.02 (ref 1.010–1.025)

## 2018-08-26 MED ORDER — CIPROFLOXACIN HCL 500 MG PO TABS: 500.0000 mg | ORAL_TABLET | Freq: Two times a day (BID) | ORAL | 0 refills | Status: AC

## 2018-08-26 MED ORDER — BLOOD GLUCOSE MONITOR KIT: PACK | 5 refills | Status: DC

## 2018-08-26 NOTE — Progress Notes (Signed)
Subjective:    Patient ID: [name], female    DOB: [birth year], 52 y.o.   MRN: [id number]  Urinary Tract Infection   This is a new problem. Episode onset: 3 days. Associated symptoms include chills, flank pain (right-sided), frequency, nausea and urgency. Pertinent negatives include no hematuria or vomiting. Associated symptoms comments: Back pain.   Reports frequency and urgency for 3 days. Reports mild dysuria. Hx of about 3-4 UTIs in the last year, has never seen urologist. Reports suprapubic pain/pressure when she feels the urge to urinate.   Would like rx to get glucose monitor, as she cannot find hers at home.   [place] about 2 weeks ago reports increased soreness to legs and arms, also having a feeling that she's going to fall occasionally with pain to right anterior thigh, "like it's going to give way".   Review of Systems  Constitutional: Positive for chills. Negative for fever.  Gastrointestinal: Positive for nausea. Negative for vomiting.  Genitourinary: Positive for flank pain (right-sided), frequency and urgency. Negative for hematuria.      Objective:   Physical Exam  Constitutional: She is oriented to person, place, and time. She appears well-developed and well-nourished. No distress.  HENT:  Head: Normocephalic and atraumatic.  Eyes: Right eye exhibits no discharge. Left eye exhibits no discharge.  Neck: Neck supple.  Cardiovascular: Normal rate, regular rhythm and normal heart sounds.  Pulmonary/Chest: Effort normal and breath sounds normal. No respiratory distress.  Abdominal: Soft. Bowel sounds are normal. She exhibits no distension and no mass. There is tenderness (suprapubic area).  Genitourinary:  Genitourinary Comments: CVA tenderness, worse on right  Musculoskeletal:  Normal ROM to all extremities with mild discomfort. No laxity on ligamentous exam to right lower knee. No pain with right hip internal and external rotation. Tender to palpation over  right tibia and right thigh. No bruising noted.   Lymphadenopathy:    She has no cervical adenopathy.  Neurological: She is alert and oriented to person, place, and time.  Skin: Skin is warm and dry.  Psychiatric: She has a normal mood and affect.  Nursing note and vitals reviewed.  Results for orders placed or performed in visit on 08/26/18  POCT urinalysis dipstick  Result Value Ref Range   Color, UA     Clarity, UA     Glucose, UA     Bilirubin, UA     Ketones, UA     Spec Grav, UA 1.020 1.010 - 1.025   Blood, UA     pH, UA 5.0 5.0 - 8.0   Protein, UA     Urobilinogen, UA     Nitrite, UA     Leukocytes, UA Trace (A) Negative   Appearance     Odor     WBCs and clusters of WBCs with few epithelial cells seen on urine microscopy.     Assessment & Plan:  1. Acute pyelonephritis Given pt symptoms of chills, aches, and right flank pain along with dysuria, frequency and urgency, will treat as though pyelonephritis with cipro 500 mg bid x 10 days. Will send urine culture, if comes back positive we will need to refer to a kidney specialist given that this would be her second case of pyelonephritis in 6 months.  Warning signs discussed she will follow-up if symptoms persist or worsen.  - POCT urinalysis dipstick - Urine Culture  2. Muscle soreness  "giving way sensation" likely from muscle soreness, no evidence of ligament laxity, may take  aleve prn. Advised that it may take several weeks for soreness from her fall to alleviate.   Patient requesting prescription for glucometer since she has lost hers.  Prescription given.  Advised diabetes follow-up in about one month, last check-up in May of this year.  She will schedule at checkout.  Dr. Mickie Hillier was consulted on this visit, he examined the patient as well, and is in agreement with the above plan. 25 minutes was spent with the patient.  This statement verifies that 25 minutes was indeed spent with the patient.  More than 50%  of this visit-total duration of the visit-was spent in counseling and coordination of care. The issues that the patient came in for today as reflected in the diagnosis (s) please refer to documentation for further details.

## 2018-08-27 ENCOUNTER — Telehealth: Payer: Self-pay | Admitting: Family Medicine

## 2018-08-27 NOTE — Telephone Encounter (Signed)
Yesterday pt was seen by Ria Comment and prescribed a glucose meter that insurance would not cover, patient contacted insurance company and they are sending her a monitor by the brand of contour next. Patient would like an order of lancets and test strips sent to listed pharmacy of [place], [place] - [address]  Advise.

## 2018-08-27 NOTE — Telephone Encounter (Signed)
Prescription faxed to pharmacy as requested.

## 2018-08-28 ENCOUNTER — Telehealth: Payer: Self-pay | Admitting: Family Medicine

## 2018-08-28 NOTE — Telephone Encounter (Signed)
Pt states [facility] in [place] did not receive the contour next meter, lancets or test strips.

## 2018-08-28 NOTE — Telephone Encounter (Signed)
Pt is aware [facility] has received the fax for her meter, lancets, and test strips.

## 2018-08-28 NOTE — Telephone Encounter (Signed)
I called pharm and confirmed they got rx that was refaxed today.

## 2018-08-29 LAB — URINE CULTURE

## 2018-09-15 ENCOUNTER — Ambulatory Visit: Payer: BLUE CROSS/BLUE SHIELD | Admitting: Family Medicine

## 2018-09-15 ENCOUNTER — Encounter: Payer: Self-pay | Admitting: Family Medicine

## 2018-09-15 ENCOUNTER — Ambulatory Visit (HOSPITAL_COMMUNITY)
Admission: RE | Admit: 2018-09-15 | Discharge: 2018-09-15 | Disposition: A | Discharge status: Home / Self Care | Payer: BLUE CROSS/BLUE SHIELD | Source: Ambulatory Visit | Attending: Family Medicine | Admitting: Family Medicine

## 2018-09-15 VITALS — BP 128/86 | Ht 63.5 in | Wt 252.6 lb

## 2018-09-15 DIAGNOSIS — M19011 Primary osteoarthritis, right shoulder: Secondary | ICD-10-CM | POA: Diagnosis not present

## 2018-09-15 DIAGNOSIS — M79601 Pain in right arm: Secondary | ICD-10-CM

## 2018-09-15 DIAGNOSIS — R350 Frequency of micturition: Secondary | ICD-10-CM

## 2018-09-15 LAB — POCT URINALYSIS DIPSTICK
SPEC GRAV UA: 1.015 (ref 1.010–1.025)
pH, UA: 6 (ref 5.0–8.0)

## 2018-09-15 NOTE — Progress Notes (Signed)
   Subjective:    Patient ID: [name], female    DOB: 11-Oct-1966, 52 y.o.   MRN: [id number]  HPI  Patient arrives with ongoing right arm pain since fall 3 weeks ago. Reports waking up in the middle of the night and tripped over a dip in the floor and caught herself with her right side. Patient states the pain goes down her arm to her fingers and has started affecting her sleep. Has tried taking 400 mg ibuprofen without much relief. 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 Reports some tingling/numbness in the fingers when the pain gets really bad. Reports pain in right arm is constant and sharp. Worse with any movement. Denies bruising after fall.   Pt also reports she completed her cipro from her last visit and is still having symptoms of frequency and left flank pain. Denies dysuria, fevers, chills, N/V, or hematuria.  Review of Systems  Musculoskeletal: Negative for joint swelling.       See HPI       Objective:   Physical Exam  Constitutional: She is oriented to person, place, and time. She appears well-developed and well-nourished. No distress.  HENT:  Head: Normocephalic and atraumatic.  Pulmonary/Chest: Effort normal. No respiratory distress.  Musculoskeletal:       Right shoulder: She exhibits decreased range of motion. She exhibits no swelling.  Right arm: No swelling noted. Tender to palpation over right biceps and forearm. Pain in her biceps with ROM of her shoulder and strength testing. Radial pulse intact and strong.   Neurological: She is alert and oriented to person, place, and time.  Skin: Skin is warm and dry.  Psychiatric: She has a normal mood and affect.  Nursing note and vitals reviewed.  Results for orders placed or performed in visit on 09/15/18  POCT urinalysis dipstick  Result Value Ref Range   Color, UA     Clarity, UA     Glucose, UA     Bilirubin, UA     Ketones, UA     Spec Grav, UA 1.015 1.010 - 1.025   Blood, UA     pH, UA 6.0 5.0  - 8.0   Protein, UA     Urobilinogen, UA     Nitrite, UA     Leukocytes, UA     Appearance     Odor         Assessment & Plan:  1. Right arm pain Will obtain xray of right shoulder, humerus, and elbow. May need PT or ortho referral based on results. Will notify pt of results (call, pt states she does not have access to mychart).  2. Urinary frequency Pt reports continued symptoms of urinary frequency and left flank pain despite completing cipro. Urine dipstick negative today, will send for culture. Will refer to urology.   Dr. Sallee Lange was consulted on this case and is in agreement with the above treatment plan.  25 minutes was spent with the patient.  This statement verifies that 25 minutes was indeed spent with the patient.  More than 50% of this visit-total duration of the visit-was spent in counseling and coordination of care. The issues that the patient came in for today as reflected in the diagnosis (s) please refer to documentation for further details.

## 2018-09-16 ENCOUNTER — Ambulatory Visit (HOSPITAL_COMMUNITY)
Admission: RE | Admit: 2018-09-16 | Discharge: 2018-09-16 | Disposition: A | Discharge status: Home / Self Care | Payer: BLUE CROSS/BLUE SHIELD | Source: Ambulatory Visit | Attending: Family Medicine | Admitting: Family Medicine

## 2018-09-16 DIAGNOSIS — M79601 Pain in right arm: Secondary | ICD-10-CM | POA: Insufficient documentation

## 2018-09-17 LAB — URINE CULTURE

## 2018-09-17 NOTE — Addendum Note (Signed)
Addended by: Dairl Ponder on: 09/17/2018 08:46 AM   Modules accepted: Orders

## 2018-09-22 ENCOUNTER — Encounter: Payer: Self-pay | Admitting: Family Medicine

## 2018-09-24 ENCOUNTER — Other Ambulatory Visit: Payer: Self-pay | Admitting: Family Medicine

## 2018-10-08 ENCOUNTER — Encounter: Payer: Self-pay | Admitting: Orthopaedic Surgery

## 2018-10-08 ENCOUNTER — Ambulatory Visit: Payer: BLUE CROSS/BLUE SHIELD | Admitting: Orthopaedic Surgery

## 2018-10-08 VITALS — Ht 63.5 in | Wt 254.0 lb

## 2018-10-08 DIAGNOSIS — M25511 Pain in right shoulder: Secondary | ICD-10-CM | POA: Diagnosis not present

## 2018-10-08 DIAGNOSIS — Z6841 Body Mass Index (BMI) 40.0 and over, adult: Secondary | ICD-10-CM

## 2018-10-08 NOTE — Progress Notes (Signed)
Subjective:    Patient ID: [name], female    DOB: 1966/01/17, 52 y.o.   MRN: [id number]  HPI [name] is a 52 y.o. female who fell and hurt her right shoulder, elbow and wrist about the first week in October.  She fell on an outstretched hand.  She had pain initially but it got a little better in a day or two but the pain in the shoulder persisted. She was seen at [facility] on 09-15-18 for this.  I have read the notes. She had x-rays the next day which were negative of the shoulder, elbow.  I have reviewed the notes and x-rays.  She continues to have right shoulder pain and a burning type pain.  She has no paresthesias. She has pain with overhead use of the hand.  She has pain rolling over on it at night.  She has no swelling, no redness.  She is not sleeping well.  She is tried of hurting.  She has tried ice, heat, Advil and rubs with no help.  She is post right breast surgery.  Body mass index is 44.29 kg/m.  The patient meets the AMA guidelines for Morbid (severe) obesity with a BMI > 40.0 and I have recommended weight loss.   Review of Systems  Constitutional: Positive for activity change.  Musculoskeletal: Positive for arthralgias and myalgias.  Psychiatric/Behavioral: The patient is nervous/anxious.   All other systems reviewed and are negative.  For Review of Systems, all other systems reviewed and are negative.  The following is a summary of the past history medically, past history surgically, known current medicines, social history and family history.  This information is gathered electronically by the computer from prior information and documentation.  I review this each visit and have found including this information at this point in the chart is beneficial and informative.   Past Medical History:  Diagnosis Date  . Anxiety   . Blood transfusion without reported diagnosis 1999   due to heavy menses  . Breast cancer ([place]) 05/22/2011     03/01/11, Stage 2, s/p lumpectomy, chemo/xrt  . DDD (degenerative disc disease), lumbar   . Depression 06/22/2011  . Diverticulitis   . DM (diabetes mellitus) ([place]) 06/22/2011  . Genital warts   . GERD (gastroesophageal reflux disease) 06/22/2011  . Hx of adenomatous colonic polyps 10/2007   79m sigmoid tubular adenoma, FH colon cancer, mother in mid-50s  . Hypercholesterolemia 06/22/2011  . Hypertension 06/22/2011  . 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[facility] 05/22/2011  . Iron deficiency anemia 03/25/2015  . Morbid obesity ([place]   . Shortness of breath   . 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 Vaginal bleeding 03/08/2014    Past Surgical History:  Procedure Laterality Date  . back surg x2    . BREAST LUMPECTOMY Right 2012  . CHOLECYSTECTOMY    . COLONOSCOPY  11/03/07   4-mm sessile polyp removed/small internal hemorrhoids/tubular adenoma, random colon bx negative for microscopic colitis  . COLONOSCOPY WITH PROPOFOL N/A 09/17/2016   Procedure: COLONOSCOPY WITH PROPOFOL;  Surgeon: RDaneil Dolin MD;  [facility]: [facility];  Service: Endoscopy;  Laterality: N/A;  9:00 am  . COLONOSCOPY, ESOPHAGOGASTRODUODENOSCOPY (EGD) AND ESOPHAGEAL DILATION  01/2012   SLF: empiric esophageal dilation, gastritis with benign bx, hemorrhoids  . DILATATION & CURETTAGE/HYSTEROSCOPY WITH MYOSURE N/A 02/10/2015   Procedure: DILATATION & CURETTAGE/HYSTEROSCOPY WITH MYOSURE;  Surgeon: BEverardo All  Yisroel Ramming, MD;  Location: [facility];  Service: Gynecology;  Laterality: N/A;  . MM BREAST STEREO BX*L*R/S     rt.  Marland Kitchen POLYPECTOMY  09/17/2016   Procedure: POLYPECTOMY;  Surgeon: Daneil Dolin, MD;  Location: [facility];  Service: Endoscopy;;  ascending colon  . PORT-A-CATH REMOVAL  05/26/2012   Procedure: REMOVAL PORT-A-CATH;  Surgeon: Jamesetta So, MD;  Location: [facility];  Service: General;  Laterality: N/A;  Minor Room  . PORTACATH PLACEMENT      Current Outpatient  Medications on File Prior to Visit  Medication Sig Dispense Refill  . albuterol (PROVENTIL HFA;VENTOLIN HFA) 108 (90 Base) MCG/ACT inhaler Inhale 2 puffs into the lungs every 6 (six) hours as needed for wheezing or shortness of breath. 1 Inhaler 0  . atorvastatin (LIPITOR) 40 MG tablet TAKE 1 TABLET BY MOUTH ONCE DAILY 90 tablet 1  . blood glucose meter kit and supplies KIT Dispense based on patient and insurance preference. TEST ONCE DAILY. DIABETES TYPE 2 ICD 10 CODE E11.9 1 each 5  . citalopram (CELEXA) 40 MG tablet Take 1 tablet (40 mg total) by mouth daily. 90 tablet 1  . lisinopril (PRINIVIL,ZESTRIL) 10 MG tablet TAKE 1 TABLET BY MOUTH ONCE DAILY 90 tablet 1  . metFORMIN (GLUCOPHAGE) 500 MG tablet TAKE 1 TABLET BY MOUTH TWICE DAILY 60 tablet 0  . pantoprazole (PROTONIX) 40 MG tablet TAKE 1 TABLET BY MOUTH ONCE DAILY FOR  ACID  REFLUX 30 tablet 0  . [DISCONTINUED] potassium chloride (KCL) 2 mEq/mL SOLN oral liquid Take 10 mL BID for 21 days, then 10 mL daily, PO 300 mL 1   No current facility-administered medications on file prior to visit.     Social History   Socioeconomic History  . Marital status: Divorced    Spouse name: Not on file  . Number of children: 3  . Years of education: Not on file  . Highest education level: Not on file  Occupational History  . Occupation: child care    Employer: [employer]  Social Needs  . Financial resource strain: Not on file  . Food insecurity:    Worry: Not on file    Inability: Not on file  . Transportation needs:    Medical: Not on file    Non-medical: Not on file  Tobacco Use  . Smoking status: Never Smoker  . Smokeless tobacco: Never Used  Substance and Sexual Activity  . Alcohol use: No    Alcohol/week: 0.0 standard drinks  . Drug use: No  . Sexual activity: Yes    Partners: Male    Birth control/protection: None  Lifestyle  . Physical activity:    Days per week: Not on file    Minutes per session: Not on file  .  Stress: Not on file  Relationships  . Social connections:    Talks on phone: Not on file    Gets together: Not on file    Attends religious service: Not on file    Active member of club or organization: Not on file    Attends meetings of clubs or organizations: Not on file    Relationship status: Not on file  . Intimate partner violence:    Fear of current or ex partner: Not on file    Emotionally abused: Not on file    Physically abused: Not on file    Forced sexual activity: Not on file  Other Topics Concern  . Not on file  Social History Narrative  . Not on file    Family History  Problem Relation Age of Onset  . Colon cancer Mother        mid-50s, died of metastatic disease  . Cancer Mother        liver  . Coronary artery disease Mother   . Diabetes type I Mother   . Peripheral vascular disease Mother        Carotid disease in her 60s  . Coronary artery disease Father        CAD in his 54s  . Diabetes Father   . Diabetes type I Father   . Hypertension Father   . Bipolar disorder Sister   . Coronary artery disease Brother        MI in his 60s  . Hypertension Brother   . Hypertension Maternal Grandmother   . Hyperlipidemia Maternal Grandmother   . Stroke Maternal Grandmother   . Hypertension Maternal Grandfather   . Diabetes Maternal Grandfather   . Diabetes Paternal Grandmother   . Heart disease Paternal Grandfather     Ht 5' 3.5" (1.613 m)   Wt 254 lb (115.2 kg)   LMP 04/05/2016 Comment: spotting 11/2016 and 12/2016  BMI 44.29 kg/m   Body mass index is 44.29 kg/m.     Objective:   Physical Exam  Constitutional: She is oriented to person, place, and time. She appears well-developed and well-nourished.  HENT:  Head: Normocephalic and atraumatic.  Eyes: Pupils are equal, round, and reactive to light. Conjunctivae and EOM are normal.  Neck: Normal range of motion. Neck supple.  Cardiovascular: Normal rate, regular rhythm and intact distal pulses.    Pulmonary/Chest: Effort normal.  Abdominal: Soft.  Musculoskeletal:       Arms: Neurological: She is alert and oriented to person, place, and time. She has normal reflexes. She displays normal reflexes. No cranial nerve deficit. She exhibits normal muscle tone. Coordination normal.  Skin: Skin is warm and dry.  Psychiatric: She has a normal mood and affect. Her behavior is normal. Judgment and thought content normal.  Vitals reviewed.         Assessment & Plan:   Encounter Diagnoses  Name Primary?  . Acute pain of right shoulder   . Body mass index 40.0-44.9, adult ([place]) Yes  . Morbid obesity ([place])    PROCEDURE NOTE:  The patient request injection, verbal consent was obtained.  The right shoulder was prepped appropriately after time out was performed.   Sterile technique was observed and injection of 1 cc of Depo-Medrol 40 mg with several cc's of plain xylocaine. Anesthesia was provided by ethyl chloride and a 20-gauge needle was used to inject the shoulder area. A posterior approach was used.  The injection was tolerated well.  A band aid dressing was applied.  The patient was advised to apply ice later today and tomorrow to the injection sight as needed.  I am concerned about a possible rotator cuff tear.  I would like to get a MRI.  Return after MRI.  Call if any problem.  Precautions discussed.   Electronically Signed Sanjuana Kava, MD 11/20/20193:03 PM

## 2018-10-08 NOTE — Patient Instructions (Addendum)
No lifting at work at all.  Marland Kitchen.Your MRI has been ordered.  We will contact your insurance company for approval. After the authorization is received the MRI and a return appointment will be scheduled with you by phone.  If you do not hear from [place] within 5 business days please call [phone] and ask for the pre-authorization representative in our office.

## 2018-10-14 ENCOUNTER — Ambulatory Visit: Payer: BLUE CROSS/BLUE SHIELD | Admitting: Family Medicine

## 2018-10-14 ENCOUNTER — Telehealth: Payer: Self-pay | Admitting: Orthopaedic Surgery

## 2018-10-14 ENCOUNTER — Encounter: Payer: Self-pay | Admitting: Family Medicine

## 2018-10-14 VITALS — BP 118/82 | Ht 63.5 in | Wt 247.1 lb

## 2018-10-14 DIAGNOSIS — I10 Essential (primary) hypertension: Secondary | ICD-10-CM

## 2018-10-14 DIAGNOSIS — Z79899 Other long term (current) drug therapy: Secondary | ICD-10-CM | POA: Diagnosis not present

## 2018-10-14 DIAGNOSIS — Z13220 Encounter for screening for lipoid disorders: Secondary | ICD-10-CM | POA: Diagnosis not present

## 2018-10-14 DIAGNOSIS — F324 Major depressive disorder, single episode, in partial remission: Secondary | ICD-10-CM

## 2018-10-14 DIAGNOSIS — Z23 Encounter for immunization: Secondary | ICD-10-CM

## 2018-10-14 DIAGNOSIS — E119 Type 2 diabetes mellitus without complications: Secondary | ICD-10-CM | POA: Diagnosis not present

## 2018-10-14 LAB — POCT GLYCOSYLATED HEMOGLOBIN (HGB A1C): Hemoglobin A1C: 5.7 % — AB (ref 4.0–5.6)

## 2018-10-14 MED ORDER — LISINOPRIL 10 MG PO TABS: 10.0000 mg | ORAL_TABLET | Freq: Every day | ORAL | 1 refills | Status: DC

## 2018-10-14 MED ORDER — PANTOPRAZOLE SODIUM 40 MG PO TBEC: DELAYED_RELEASE_TABLET | ORAL | 1 refills | Status: DC

## 2018-10-14 MED ORDER — CITALOPRAM HYDROBROMIDE 40 MG PO TABS: 40.0000 mg | ORAL_TABLET | Freq: Every day | ORAL | 1 refills | Status: DC

## 2018-10-14 MED ORDER — ATORVASTATIN CALCIUM 40 MG PO TABS: 40.0000 mg | ORAL_TABLET | Freq: Every day | ORAL | 1 refills | Status: DC

## 2018-10-14 MED ORDER — GLIPIZIDE ER 5 MG PO TB24: 5.0000 mg | ORAL_TABLET | Freq: Every day | ORAL | 1 refills | Status: DC

## 2018-10-14 NOTE — Telephone Encounter (Signed)
Dorian Pod with [facility] called stating that this patient has an appointment to have a MRI on Sunday. Her insurance requires an authorization and [facility] needs it by tomorrow.  Please call Dorian Pod at [phone]

## 2018-10-14 NOTE — Progress Notes (Signed)
   Subjective:    Patient ID: [name], female    DOB: December 23, 1965, 52 y.o.   MRN: [id number]  HPI Patient is here today to follow up on her Dm. She states she is on Metformin 500 one po bid. She says she keeps diarrhea all the time from this.    Ortho doc thinks the shouldr pain is rot cuff tear    loose tools due to the metformin     Patient claims compliance with diabetes medication. No obvious side effects. Reports no substantial low sugar spells. Most numbers are generally in good range when checked fasting. Generally does not miss a dose of medication. Watching diabetic diet closely   Patient continues to take lipid medication regularly. No obvious side effects from it. Generally does not miss a dose. Prior blood work results are reviewed with patient. Patient continues to work on fat intake in diet   Blood pressure medicine and blood pressure levels reviewed today with patient. Compliant with blood pressure medicine. States does not miss a dose. No obvious side effects. Blood pressure generally good when checked elsewhere. Watching salt intake.  Patient notes ongoing compliance with antidepressant medication. No obvious side effects. Reports does not miss a dose. Overall continues to help depression substantially. No thoughts of homicide or suicide. Would like to maintain medication.     She does see an eye Dr. Gershon Crane for dm retinopathy check. She does not see any other specialists. She says she does not exercise and does try to eat healthier.  No tissue composition recorded. Results for orders placed or performed in visit on 10/14/18  POCT glycosylated hemoglobin (Hb A1C)  Result Value Ref Range   Hemoglobin A1C 5.7 (A) 4.0 - 5.6 %   HbA1c POC (<> result, manual entry)     HbA1c, POC (prediabetic range)     HbA1c, POC (controlled diabetic range)         Review of Systems No headache, no major weight loss or weight gain, no chest pain no back pain  abdominal pain no change in bowel habits complete ROS otherwise negative     Objective:   Physical Exam Alert and oriented, vitals reviewed and stable, NAD ENT-TM's and ext canals WNL bilat via otoscopic exam Soft palate, tonsils and post pharynx WNL via oropharyngeal exam Neck-symmetric, no masses; thyroid nonpalpable and nontender Pulmonary-no tachypnea or accessory muscle use; Clear without wheezes via auscultation Card--no abnrml murmurs, rhythm reg and rate WNL Carotid pulses symmetric, without bruits        Assessment & Plan:  Impression diabetes.  Unfortunately substantial side effects with metformin.  Stop metformin.  Small XL 5 mg p.m. rational discussed  2.  Hypertension good control discussed things meds her graph #3 hyperlipidemia.  Prior blood work reviewed.  Maintain same pending.  Pro  4.  Depression ongoing potential with the best control patient wishes to stay with current therapy will maintain Celexa.  No suicidal thoughts.  5.  Vaccine discussion flu shot plus pneumonia shot rationale discussed  Follow-up as scheduled

## 2018-10-14 NOTE — Telephone Encounter (Signed)
Request submitted on website and insurance company called to try to expedite the authorization. PA is currently under review. Pt called and notified that we have not received approval yet and she should cancel/reschedule exam. Left VM with Dorian Pod at [place] to inform her PA has not been approved at this time.

## 2018-10-19 ENCOUNTER — Other Ambulatory Visit: Payer: BLUE CROSS/BLUE SHIELD

## 2018-10-20 ENCOUNTER — Other Ambulatory Visit: Payer: Self-pay | Admitting: Family Medicine

## 2018-10-20 LAB — BASIC METABOLIC PANEL WITH GFR
BUN: 18 mg/dL (ref 7–25)
CO2: 31 mmol/L (ref 20–32)
Calcium: 9.3 mg/dL (ref 8.6–10.4)
Chloride: 102 mmol/L (ref 98–110)
Creat: 0.86 mg/dL (ref 0.50–1.05)
GFR, Est African American: 90 mL/min/{1.73_m2} (ref >=60)
GFR, Est Non African American: 78 mL/min/{1.73_m2} (ref >=60)
Glucose, Bld: 122 mg/dL — ABNORMAL HIGH (ref 65–99)
Potassium: 3.9 mmol/L (ref 3.5–5.3)
Sodium: 140 mmol/L (ref 135–146)

## 2018-10-20 LAB — HEPATIC FUNCTION PANEL
AG Ratio: 1.4 (calc) (ref 1.0–2.5)
ALT: 19 U/L (ref 6–29)
AST: 17 U/L (ref 10–35)
Albumin: 4.2 g/dL (ref 3.6–5.1)
Alkaline phosphatase (APISO): 59 U/L (ref 33–130)
Bilirubin, Direct: 0.1 mg/dL (ref 0.0–0.2)
Globulin: 3 g/dL (calc) (ref 1.9–3.7)
Indirect Bilirubin: 0.3 mg/dL (calc) (ref 0.2–1.2)
Total Bilirubin: 0.4 mg/dL (ref 0.2–1.2)
Total Protein: 7.2 g/dL (ref 6.1–8.1)

## 2018-10-20 LAB — LIPID PANEL
Cholesterol: 173 mg/dL (ref <200)
HDL: 39 mg/dL — ABNORMAL LOW (ref >50)
LDL Cholesterol (Calc): 98 mg/dL (calc)
Non-HDL Cholesterol (Calc): 134 mg/dL (calc) — ABNORMAL HIGH (ref <130)
Total CHOL/HDL Ratio: 4.4 (calc) (ref <5.0)
Triglycerides: 252 mg/dL — ABNORMAL HIGH (ref <150)

## 2018-10-20 LAB — MICROALBUMIN / CREATININE URINE RATIO
Creatinine, Urine: 123 mg/dL (ref 20–275)
Microalb Creat Ratio: 10 mcg/mg creat (ref <30)
Microalb, Ur: 1.2 mg/dL

## 2018-10-22 ENCOUNTER — Telehealth: Payer: Self-pay | Admitting: Orthopaedic Surgery

## 2018-10-22 ENCOUNTER — Ambulatory Visit: Payer: BLUE CROSS/BLUE SHIELD | Admitting: Orthopaedic Surgery

## 2018-10-22 NOTE — Telephone Encounter (Signed)
Patient called and wanted to know was her next step since her insurance has denied the MRI? I told her that I had spoke with you and since Dr. Luna Glasgow has already left for the day that you would speak with him tomorrow and get back with her.

## 2018-10-24 NOTE — Telephone Encounter (Signed)
Pt notified that appeal has been submitted and we are awaiting response.

## 2018-10-26 ENCOUNTER — Other Ambulatory Visit: Payer: BLUE CROSS/BLUE SHIELD

## 2018-11-10 ENCOUNTER — Encounter: Payer: Self-pay | Admitting: Family Medicine

## 2019-01-07 ENCOUNTER — Ambulatory Visit (INDEPENDENT_AMBULATORY_CARE_PROVIDER_SITE_OTHER): Payer: PRIVATE HEALTH INSURANCE | Admitting: Family Medicine

## 2019-01-07 ENCOUNTER — Encounter: Payer: Self-pay | Admitting: Family Medicine

## 2019-01-07 VITALS — BP 128/82 | Ht 63.5 in | Wt 261.4 lb

## 2019-01-07 DIAGNOSIS — M5431 Sciatica, right side: Secondary | ICD-10-CM | POA: Diagnosis not present

## 2019-01-07 MED ORDER — PREDNISONE 20 MG PO TABS: ORAL_TABLET | ORAL | 0 refills | Status: DC

## 2019-01-07 NOTE — Progress Notes (Signed)
   Subjective:    Patient ID: [name], female    DOB: 1966/07/08, 53 y.o.   MRN: [id number]  HPI  Patient arrives with right leg pain that has worsened over the last 2 days. Patient states the pain has put her nearly in tears at times.  Pt developed a deep ache in the right leg  Got more and more painful   Describes as a deep ache in the leg  Does not smoke  Trouble walking on it ow  Positive history of sciatica and lumbar surgery in the past  Review of Systems No headache, no major weight loss or weight gain, no chest pain no back pain abdominal pain no change in bowel habits complete ROS otherwise negative     Objective:   Physical Exam Alert vitals stable, NAD. Blood pressure good on repeat. HEENT normal. Lungs clear. Heart regular rate and rhythm.   Right leg positive straight leg raise.  Fairly substantial in nature.  Reflexes still intact.  Sensation intact.     Assessment & Plan:  Impression sciatica discussed.  Prednisone taper hydrocodone as needed.  Symptom care discussed.  Follow-up as recommended

## 2019-01-08 ENCOUNTER — Telehealth: Payer: Self-pay | Admitting: Family Medicine

## 2019-01-08 MED ORDER — HYDROCODONE-ACETAMINOPHEN 5-325 MG PO TABS: ORAL_TABLET | ORAL | 0 refills | Status: DC

## 2019-01-08 NOTE — Telephone Encounter (Signed)
Patient is aware 

## 2019-01-08 NOTE — Telephone Encounter (Signed)
done

## 2019-01-08 NOTE — Telephone Encounter (Signed)
Patient was seen yesterday by Dr.Steve in regards of right leg pain, patient states there was a discussion in regards being prescribed pain medication for leg. Advise.   Pharmacy:  [facility] [address], [place] [address]

## 2019-01-08 NOTE — Telephone Encounter (Signed)
Please advise. Thank you

## 2019-01-21 ENCOUNTER — Ambulatory Visit: Payer: PRIVATE HEALTH INSURANCE | Admitting: Family Medicine

## 2019-01-24 ENCOUNTER — Other Ambulatory Visit: Payer: Self-pay

## 2019-01-24 ENCOUNTER — Emergency Department (HOSPITAL_COMMUNITY)
Admission: EM | Admit: 2019-01-24 | Discharge: 2019-01-24 | Disposition: A | Discharge status: Home / Self Care | Payer: BLUE CROSS/BLUE SHIELD | Attending: Emergency Medicine | Admitting: Emergency Medicine

## 2019-01-24 ENCOUNTER — Encounter (HOSPITAL_COMMUNITY): Payer: Self-pay | Admitting: Emergency Medicine

## 2019-01-24 DIAGNOSIS — Z7984 Long term (current) use of oral hypoglycemic drugs: Secondary | ICD-10-CM | POA: Insufficient documentation

## 2019-01-24 DIAGNOSIS — I10 Essential (primary) hypertension: Secondary | ICD-10-CM | POA: Insufficient documentation

## 2019-01-24 DIAGNOSIS — E119 Type 2 diabetes mellitus without complications: Secondary | ICD-10-CM | POA: Insufficient documentation

## 2019-01-24 DIAGNOSIS — Z853 Personal history of malignant neoplasm of breast: Secondary | ICD-10-CM | POA: Insufficient documentation

## 2019-01-24 DIAGNOSIS — R112 Nausea with vomiting, unspecified: Secondary | ICD-10-CM

## 2019-01-24 DIAGNOSIS — Z79899 Other long term (current) drug therapy: Secondary | ICD-10-CM | POA: Insufficient documentation

## 2019-01-24 DIAGNOSIS — R197 Diarrhea, unspecified: Secondary | ICD-10-CM | POA: Insufficient documentation

## 2019-01-24 LAB — CBC WITH DIFFERENTIAL/PLATELET
Abs Immature Granulocytes: 0.02 10*3/uL (ref 0.00–0.07)
Basophils Absolute: 0 10*3/uL (ref 0.0–0.1)
Basophils Relative: 0 %
Eosinophils Absolute: 0 10*3/uL (ref 0.0–0.5)
Eosinophils Relative: 1 %
HCT: 45.7 % (ref 36.0–46.0)
Hemoglobin: 14.1 g/dL (ref 12.0–15.0)
Immature Granulocytes: 0 %
Lymphocytes Relative: 12 %
Lymphs Abs: 0.9 10*3/uL (ref 0.7–4.0)
MCH: 26.9 pg (ref 26.0–34.0)
MCHC: 30.9 g/dL (ref 30.0–36.0)
MCV: 87.2 fL (ref 80.0–100.0)
Monocytes Absolute: 0.4 10*3/uL (ref 0.1–1.0)
Monocytes Relative: 6 %
Neutro Abs: 6.1 10*3/uL (ref 1.7–7.7)
Neutrophils Relative %: 81 %
Platelets: 202 10*3/uL (ref 150–400)
RBC: 5.24 MIL/uL — ABNORMAL HIGH (ref 3.87–5.11)
RDW: 15.8 % — ABNORMAL HIGH (ref 11.5–15.5)
WBC: 7.5 10*3/uL (ref 4.0–10.5)
nRBC: 0 % (ref 0.0–0.2)

## 2019-01-24 LAB — COMPREHENSIVE METABOLIC PANEL
ALT: 21 U/L (ref 0–44)
AST: 16 U/L (ref 15–41)
Albumin: 4.1 g/dL (ref 3.5–5.0)
Alkaline Phosphatase: 56 U/L (ref 38–126)
Anion gap: 11 (ref 5–15)
BUN: 12 mg/dL (ref 6–20)
CO2: 25 mmol/L (ref 22–32)
Calcium: 8.9 mg/dL (ref 8.9–10.3)
Chloride: 103 mmol/L (ref 98–111)
Creatinine, Ser: 0.81 mg/dL (ref 0.44–1.00)
GFR calc Af Amer: >60 mL/min (ref >60)
GFR calc non Af Amer: >60 mL/min (ref >60)
Glucose, Bld: 120 mg/dL — ABNORMAL HIGH (ref 70–99)
Potassium: 3.8 mmol/L (ref 3.5–5.1)
Sodium: 139 mmol/L (ref 135–145)
Total Bilirubin: 1 mg/dL (ref 0.3–1.2)
Total Protein: 8 g/dL (ref 6.5–8.1)

## 2019-01-24 LAB — LIPASE, BLOOD: Lipase: 26 U/L (ref 11–51)

## 2019-01-24 MED ORDER — BISMUTH SUBSALICYLATE 262 MG PO CHEW
524.0000 mg | CHEWABLE_TABLET | Freq: Once | ORAL | Status: DC
  Filled 2019-01-24: qty 2

## 2019-01-24 MED ORDER — LIDOCAINE HCL (PF) 1 % IJ SOLN: 20.0000 mL | Freq: Once | INTRAMUSCULAR | Status: DC

## 2019-01-24 MED ORDER — ONDANSETRON 8 MG PO TBDP
8.0000 mg | ORAL_TABLET | Freq: Once | ORAL | Status: AC
  Administered 2019-01-24: 8 mg via ORAL
  Filled 2019-01-24: qty 1

## 2019-01-24 MED ORDER — BISMUTH SUBSALICYLATE 262 MG/15ML PO SUSP
30.0000 mL | Freq: Once | ORAL | Status: AC
  Administered 2019-01-24: 30 mL via ORAL
  Filled 2019-01-24: qty 118

## 2019-01-24 MED ORDER — ONDANSETRON HCL 4 MG PO TABS: 4.0000 mg | ORAL_TABLET | Freq: Three times a day (TID) | ORAL | 0 refills | Status: DC | PRN

## 2019-01-24 MED ORDER — ONDANSETRON HCL 8 MG PO TABS: 8.0000 mg | ORAL_TABLET | Freq: Three times a day (TID) | ORAL | 0 refills | Status: DC | PRN

## 2019-01-24 MED ORDER — ONDANSETRON HCL 4 MG/2ML IJ SOLN
4.0000 mg | Freq: Once | INTRAMUSCULAR | Status: AC
Start: 2019-01-24 — End: 2019-01-24
  Administered 2019-01-24: 4 mg via INTRAVENOUS
  Filled 2019-01-24: qty 2

## 2019-01-24 MED ORDER — SODIUM CHLORIDE 0.9 % IV BOLUS
1000.0000 mL | Freq: Once | INTRAVENOUS | Status: AC
  Administered 2019-01-24: 1000 mL via INTRAVENOUS

## 2019-01-24 MED ORDER — BISMUTH SUBSALICYLATE 262 MG/15ML PO SUSP
ORAL | Status: AC
  Filled 2019-01-24: qty 236

## 2019-01-24 NOTE — ED Notes (Signed)
EDP notified of burning stomach. Dr Eulis Foster gave order to give pepto bismol. AC notified to get from pharmacy

## 2019-01-24 NOTE — ED Notes (Signed)
Patient stated she had episode of diarrhea.

## 2019-01-24 NOTE — ED Notes (Addendum)
Pt given ODT and then 3 mins later and stated she vomited it back up. Look in bag and had small amount of spit in bag. EDP notified

## 2019-01-24 NOTE — ED Notes (Signed)
prepack of zofran given to patient

## 2019-01-24 NOTE — ED Notes (Signed)
Pt refused states it will make her vomit

## 2019-01-24 NOTE — ED Notes (Signed)
Patient given ginger ale. 

## 2019-01-24 NOTE — ED Notes (Signed)
Patient stated that she has not vomited but has a belly pain and still has nausea. edp notified

## 2019-01-24 NOTE — ED Triage Notes (Signed)
N/V/D all night. Burning pain to middle of abd.

## 2019-01-24 NOTE — ED Notes (Signed)
Pt encouraged to take sips of ginger ale. Went back to check on pt and pt hasnt vomited but states it burns her stomach

## 2019-01-24 NOTE — ED Notes (Signed)
Pt states she does not feel well enough to try PO fluids. Said she will try later. edp notified of the same

## 2019-01-24 NOTE — ED Provider Notes (Signed)
[facility] Provider Note   CSN: [id number] Arrival date & time: 01/24/19  1448    History   Chief Complaint Chief Complaint  Patient presents with  . Emesis    HPI [name] is a 53 y.o. female.     HPI   She presents for evaluation of nausea, vomiting and diarrhea which started yesterday and have been persistent.  She has been unable to tolerate anything orally.  She has contacts at work, she works in a daycare.  She denies fever, blood in emesis or stool, back pain and dizziness.  She has mild cramping abdominal pain.  There are no other known modifying factors.  Past Medical History:  Diagnosis Date  . Anxiety   . Blood transfusion without reported diagnosis 1999   due to heavy menses  . Breast cancer ([place]) 05/22/2011   03/01/11, Stage 2, s/p lumpectomy, chemo/xrt  . DDD (degenerative disc disease), lumbar   . Depression 06/22/2011  . Diverticulitis   . DM (diabetes mellitus) ([place]) 06/22/2011  . Genital warts   . GERD (gastroesophageal reflux disease) 06/22/2011  . Hx of adenomatous colonic polyps 10/2007   45m sigmoid tubular adenoma, FH colon cancer, mother in mid-50s  . Hypercholesterolemia 06/22/2011  . Hypertension 06/22/2011  . 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[facility] 05/22/2011  . Iron deficiency anemia 03/25/2015  . Morbid obesity ([facility]   . Shortness of breath   . 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 Vaginal bleeding 03/08/2014    Patient Active Problem List   Diagnosis Date Noted  . Central adiposity 04/12/2018  . Seasonal affective disorder ([facility] 12/15/2017  . Chest pain 02/27/2017  . 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 Panic disorder 01/09/2017  . Rectal bleeding 09/06/2016  . Abdominal cramping 09/06/2016  . Fatty liver 09/06/2016  . Dehydration 12/15/2015  . Orthostatic dizziness 12/15/2015  . Nausea vomiting and diarrhea 12/15/2015    . Iron deficiency anemia 03/25/2015  . Excessive or frequent menstruation 04/08/2014  . Dysmenorrhea 04/08/2014  . H/O adenomatous polyp of colon 01/24/2012  . FH: colon cancer 01/24/2012  . Esophageal dysphagia 01/24/2012  . Chronic diarrhea 01/24/2012  . Depression 06/22/2011  . GERD (gastroesophageal reflux disease) 06/22/2011  . DM (diabetes mellitus) ([facility] 06/22/2011  . Hypercholesterolemia 06/22/2011  . Hypertension 06/22/2011  . Obesity 06/22/2011  . 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[facility] 05/22/2011    Past Surgical History:  Procedure Laterality Date  . back surg x2    . BREAST LUMPECTOMY Right 2012  . CHOLECYSTECTOMY    . COLONOSCOPY  11/03/07   4-mm sessile polyp removed/small internal hemorrhoids/tubular adenoma, random colon bx negative for microscopic colitis  . COLONOSCOPY WITH PROPOFOL N/A 09/17/2016   Procedure: COLONOSCOPY WITH PROPOFOL;  Surgeon: RDaneil Dolin MD;  [facility];  Service: Endoscopy;  Laterality: N/A;  9:00 am  . COLONOSCOPY, ESOPHAGOGASTRODUODENOSCOPY (EGD) AND ESOPHAGEAL DILATION  01/2012   SLF: empiric esophageal dilation, gastritis with benign bx, hemorrhoids  . DILATATION & CURETTAGE/HYSTEROSCOPY WITH MYOSURE N/A 02/10/2015   Procedure: DILATATION & CURETTAGE/HYSTEROSCOPY WITH MYOSURE;  Surgeon: BNunzio Cobbs MD;  [facility];  Service: Gynecology;  Laterality: N/A;  . MM BREAST STEREO BX*L*R/S     rt.  .Marland KitchenPOLYPECTOMY  09/17/2016   Procedure: POLYPECTOMY;  Surgeon: RDaneil Dolin MD;  [facility];  Service: Endoscopy;;  ascending colon  . PORT-A-CATH REMOVAL  05/26/2012   Procedure: REMOVAL PORT-A-CATH;  Surgeon: Jamesetta So, MD;  Location: [facility];  Service: General;  Laterality: N/A;  Minor Room  . PORTACATH PLACEMENT       OB History    Gravida  4   Para  3   Term  0   Preterm  0   AB  1   Living  3     SAB  0   TAB  0   Ectopic  0   Multiple  0   Live Births  3             Home Medications    Prior to Admission medications   Medication Sig Start Date End Date Taking? Authorizing Provider  atorvastatin (LIPITOR) 40 MG tablet Take 1 tablet (40 mg total) by mouth daily. 10/14/18  Yes Mikey Kirschner, MD  blood glucose meter kit and supplies KIT Dispense based on patient and insurance preference. TEST ONCE DAILY. DIABETES TYPE 2 ICD 10 CODE E11.9 08/26/18  Yes Cheyenne Adas, NP  citalopram (CELEXA) 40 MG tablet Take 1 tablet (40 mg total) by mouth daily. 10/14/18  Yes Mikey Kirschner, MD  glipiZIDE (GLUCOTROL XL) 5 MG 24 hr tablet Take 1 tablet (5 mg total) by mouth daily with breakfast. 10/14/18  Yes Mikey Kirschner, MD  lisinopril (PRINIVIL,ZESTRIL) 10 MG tablet Take 1 tablet (10 mg total) by mouth daily. 10/14/18  Yes Mikey Kirschner, MD  pantoprazole (PROTONIX) 40 MG tablet Take one po daily 10/14/18  Yes Mikey Kirschner, MD  albuterol (PROVENTIL HFA;VENTOLIN HFA) 108 (90 Base) MCG/ACT inhaler Inhale 2 puffs into the lungs every 6 (six) hours as needed for wheezing or shortness of breath. 07/16/18   Mikey Kirschner, MD  HYDROcodone-acetaminophen (NORCO/VICODIN) 5-325 MG tablet One po Q 4-6 hours prn pain. 01/08/19   Mikey Kirschner, MD  ondansetron (ZOFRAN) 8 MG tablet Take 1 tablet (8 mg total) by mouth every 8 (eight) hours as needed for nausea or vomiting. 01/24/19   Daleen Bo, MD  predniSONE (DELTASONE) 20 MG tablet Take three po for three days, then take 2 po for three days,then take one po for three days. 01/07/19   Mikey Kirschner, MD  potassium chloride (KCL) 2 mEq/mL SOLN oral liquid Take 10 mL BID for 21 days, then 10 mL daily, PO 09/21/11 05/30/18  Baird Cancer, PA-C    Family History Family History  Problem Relation Age of Onset  . Colon cancer Mother        mid-50s, died of metastatic disease  . Cancer Mother        liver  . Coronary artery disease Mother   . Diabetes type I Mother   . Peripheral vascular  disease Mother        Carotid disease in her 57s  . Coronary artery disease Father        CAD in his 56s  . Diabetes Father   . Diabetes type I Father   . Hypertension Father   . Bipolar disorder Sister   . Coronary artery disease Brother        MI in his 57s  . Hypertension Brother   . Hypertension Maternal Grandmother   . Hyperlipidemia Maternal Grandmother   . Stroke Maternal Grandmother   . Hypertension Maternal Grandfather   . Diabetes Maternal Grandfather   . Diabetes Paternal Grandmother   . Heart disease Paternal Grandfather  Social History Social History   Tobacco Use  . Smoking status: Never Smoker  . Smokeless tobacco: Never Used  Substance Use Topics  . Alcohol use: No    Alcohol/week: 0.0 standard drinks  . Drug use: No     Allergies   Metformin and related; Flagyl [metronidazole]; Lansoprazole; and Pravastatin   Review of Systems Review of Systems  All other systems reviewed and are negative.    Physical Exam Updated Vital Signs BP 111/64   Pulse 99   Temp 99.6 F (37.6 C) (Oral)   Resp 20   Ht '5\' 3"'$  (1.6 m)   Wt 118.6 kg   LMP 04/05/2016 Comment: spotting 11/2016 and 12/2016  SpO2 95%   BMI 46.30 kg/m   Physical Exam Vitals signs and nursing note reviewed.  Constitutional:      General: She is not in acute distress.    Appearance: She is well-developed. She is obese. She is not ill-appearing, toxic-appearing or diaphoretic.  HENT:     Head: Normocephalic and atraumatic.     Right Ear: External ear normal.     Left Ear: External ear normal.  Eyes:     Conjunctiva/sclera: Conjunctivae normal.     Pupils: Pupils are equal, round, and reactive to light.  Neck:     Musculoskeletal: Normal range of motion and neck supple.     Trachea: Phonation normal.  Cardiovascular:     Rate and Rhythm: Normal rate and regular rhythm.     Heart sounds: Normal heart sounds.  Pulmonary:     Effort: Pulmonary effort is normal.     Breath sounds:  Normal breath sounds.  Abdominal:     General: There is no distension.     Palpations: Abdomen is soft. There is no mass.     Tenderness: There is no abdominal tenderness (Periumbilical, mild). There is no guarding.     Hernia: No hernia is present.  Musculoskeletal: Normal range of motion.  Skin:    General: Skin is warm and dry.  Neurological:     Mental Status: She is alert and oriented to person, place, and time.     Cranial Nerves: No cranial nerve deficit.     Sensory: No sensory deficit.     Motor: No abnormal muscle tone.     Coordination: Coordination normal.  Psychiatric:        Behavior: Behavior normal.        Thought Content: Thought content normal.        Judgment: Judgment normal.      ED Treatments / Results  Labs (all labs ordered are listed, but only abnormal results are displayed) Labs Reviewed  COMPREHENSIVE METABOLIC PANEL - Abnormal; Notable for the following components:      Result Value   Glucose, Bld 120 (*)    All other components within normal limits  CBC WITH DIFFERENTIAL/PLATELET - Abnormal; Notable for the following components:   RBC 5.24 (*)    RDW 15.8 (*)    All other components within normal limits  LIPASE, BLOOD    EKG None  Radiology No results found.  Procedures Procedures (including critical care time)  Medications Ordered in ED Medications  ondansetron (ZOFRAN-ODT) disintegrating tablet 8 mg (8 mg Oral Given 01/24/19 1548)  sodium chloride 0.9 % bolus 1,000 mL (0 mLs Intravenous Stopped 01/24/19 1914)  ondansetron (ZOFRAN) injection 4 mg (4 mg Intravenous Given 01/24/19 5852)  bismuth subsalicylate (PEPTO BISMOL) 262 MG/15ML suspension 30 mL (30 mLs Oral  Given 01/24/19 2144)     Initial Impression / Assessment and Plan / ED Course  I have reviewed the triage vital signs and the nursing notes.  Pertinent labs & imaging results that were available during my care of the patient were reviewed by me and considered in my medical  decision making (see chart for details).  Clinical Course as of Jan 23 2305  Sat Jan 24, 2019  1738 Patient was offered Pepto-Bismol for complaint of burning in her abdomen, which she declined.   [EW]  1750 She reports that she is no better now and would like to try additional treatment.  IV fluids, medications, and blood work ordered.   [EW]  1923 Normal  Lipase, blood [EW]  1923 Normal except glucose high  Comprehensive metabolic panel(!) [EW]  1165 Normal  CBC with Differential(!) [EW]    Clinical Course User Index [EW] Daleen Bo, MD        Patient Vitals for the past 24 hrs:  BP Temp Temp src Pulse Resp SpO2 Height Weight  01/24/19 2200 111/64 - - 99 - 95 % - -  01/24/19 1848 130/79 99.6 F (37.6 C) Oral 94 20 98 % - -  01/24/19 1500 126/72 99.5 F (37.5 C) Oral (!) 130 20 95 % '5\' 3"'$  (1.6 m) 118.6 kg    11:06 PM Reevaluation with update and discussion. After initial assessment and treatment, an updated evaluation reveals she is comfortable this time has no further complaints.  She is tolerating some oral liquids at this time.  Findings discussed and questions answered. Daleen Bo   Medical Decision Making: Nonspecific nausea, vomiting and diarrhea.  Doubt serious bacterial infection, metabolic instability or impending vascular collapse.  CRITICAL CARE-no Performed by: Daleen Bo  Nursing Notes Reviewed/ Care Coordinated Applicable Imaging Reviewed Interpretation of Laboratory Data incorporated into ED treatment  The patient appears reasonably screened and/or stabilized for discharge and I doubt any other medical condition or other [facility] requiring further screening, evaluation, or treatment in the ED at this time prior to discharge.  Plan: Home Medications-OTC antidiarrheal of choice, Tylenol if needed, continue usual medications; Home Treatments-clear liquids and gradually advance diet; return here if the recommended treatment, does not improve the symptoms;  Recommended follow up-PCP, PRN   Final Clinical Impressions(s) / ED Diagnoses   Final diagnoses:  Nausea vomiting and diarrhea    ED Discharge Orders         Ordered    ondansetron (ZOFRAN) 8 MG tablet  Every 8 hours PRN     01/24/19 2305           Daleen Bo, MD 01/24/19 2307

## 2019-01-24 NOTE — Discharge Instructions (Addendum)
Start with a clear liquid diet then gradually advance to regular foods after a day or 2.  For diarrhea use Kaopectate or Imodium.

## 2019-01-24 NOTE — ED Notes (Signed)
Called AC for pepto

## 2019-01-28 MED FILL — Ondansetron HCl Tab 4 MG: ORAL | Qty: 4 | Status: AC

## 2019-02-10 ENCOUNTER — Ambulatory Visit: Payer: Self-pay | Admitting: Urology

## 2019-03-05 ENCOUNTER — Ambulatory Visit: Payer: BLUE CROSS/BLUE SHIELD | Admitting: Obstetrics and Gynecology

## 2019-03-13 ENCOUNTER — Ambulatory Visit: Payer: BLUE CROSS/BLUE SHIELD | Admitting: Obstetrics and Gynecology

## 2019-04-01 ENCOUNTER — Ambulatory Visit (INDEPENDENT_AMBULATORY_CARE_PROVIDER_SITE_OTHER): Payer: PRIVATE HEALTH INSURANCE | Admitting: Family Medicine

## 2019-04-01 ENCOUNTER — Other Ambulatory Visit: Payer: Self-pay

## 2019-04-01 VITALS — BP 128/88 | Temp 98.7°F

## 2019-04-01 DIAGNOSIS — E559 Vitamin D deficiency, unspecified: Secondary | ICD-10-CM | POA: Diagnosis not present

## 2019-04-01 DIAGNOSIS — I10 Essential (primary) hypertension: Secondary | ICD-10-CM | POA: Diagnosis not present

## 2019-04-01 DIAGNOSIS — E119 Type 2 diabetes mellitus without complications: Secondary | ICD-10-CM

## 2019-04-01 MED ORDER — FUROSEMIDE 20 MG PO TABS: ORAL_TABLET | ORAL | 5 refills | Status: DC

## 2019-04-01 MED ORDER — METFORMIN HCL ER 750 MG PO TB24: 750.0000 mg | ORAL_TABLET | Freq: Every day | ORAL | 5 refills | Status: DC

## 2019-04-01 NOTE — Progress Notes (Signed)
   Subjective:    Patient ID: [name], female    DOB: 1966-02-09, 53 y.o.   MRN: [id number]  HPISwelling in feet, ankles, and hands. Started last fall getting worse since being on glipizide.   Patient seen for urgent visit because she was having swelling in her ankles her hands and feet she also related that she was feeling short winded at times denies any PND denies orthopnea.  States her sugars have been running slightly elevated and relates that ever since starting on glipizide she has been gaining weight she also states she feels it is contributing to the swelling  Patient states her sugars were doing better on metformin but she was having diarrhea issues.  Review of Systems  Constitutional: Negative for activity change, appetite change and fatigue.  HENT: Negative for congestion.   Respiratory: Positive for shortness of breath. Negative for cough.   Cardiovascular: Positive for leg swelling. Negative for chest pain.  Gastrointestinal: Negative for abdominal pain.  Skin: Negative for color change.  Neurological: Negative for headaches.  Psychiatric/Behavioral: Negative for behavioral problems.       Objective:   Physical Exam Vitals signs reviewed.  Constitutional:      General: She is not in acute distress.    Appearance: She is well-developed.  HENT:     Head: Normocephalic and atraumatic.  Eyes:     General:        Right eye: No discharge.        Left eye: No discharge.  Neck:     Trachea: No tracheal deviation.  Cardiovascular:     Rate and Rhythm: Normal rate and regular rhythm.     Heart sounds: Normal heart sounds. No murmur.  Pulmonary:     Effort: Pulmonary effort is normal. No respiratory distress.     Breath sounds: Normal breath sounds. No wheezing or rales.  Lymphadenopathy:     Cervical: No cervical adenopathy.  Skin:    General: Skin is warm and dry.     Findings: No erythema.  Neurological:     Mental Status: She is alert.     Motor: No  abnormal muscle tone.  Psychiatric:        Behavior: Behavior normal.    Patient does have some pitting edema of both legs not severe There are no crackles on the pulmonary exam no sign of CHF       Assessment & Plan:  Patient's blood pressure under good control Taking lisinopril Having mild pedal edema I find no evidence of CHF I would recommend Lasix 20 mg every morning as needed I told patient more than likely she will have to take it 2-3 times per week  Diabetes not tolerating glipizide We discussed sustained-release metformin and if the patient tolerates it potentially that will benefit her in more ways than 1+ also minimize weight gain from glipizide  Patient concerned about the possibility of vitamin D deficiency we will check vitamin D level  Patient will give [place] feedback within the next few weeks if that is going well we will stick with the Metformin and have the patient follow-up with Dr. Richardson Landry in the summer with lab work  No need to give potassium currently because lisinopril should minimize potassium loss

## 2019-04-02 ENCOUNTER — Other Ambulatory Visit: Payer: Self-pay | Admitting: Family Medicine

## 2019-04-02 ENCOUNTER — Other Ambulatory Visit (HOSPITAL_COMMUNITY): Payer: BLUE CROSS/BLUE SHIELD

## 2019-04-09 ENCOUNTER — Ambulatory Visit (HOSPITAL_COMMUNITY): Payer: BLUE CROSS/BLUE SHIELD | Admitting: Hematology

## 2019-04-14 ENCOUNTER — Ambulatory Visit: Payer: BLUE CROSS/BLUE SHIELD | Admitting: Family Medicine

## 2019-04-15 ENCOUNTER — Ambulatory Visit: Payer: BLUE CROSS/BLUE SHIELD | Admitting: Family Medicine

## 2019-04-22 ENCOUNTER — Other Ambulatory Visit (HOSPITAL_COMMUNITY): Payer: Self-pay | Admitting: Internal Medicine

## 2019-04-22 DIAGNOSIS — Z1231 Encounter for screening mammogram for malignant neoplasm of breast: Secondary | ICD-10-CM

## 2019-04-24 ENCOUNTER — Ambulatory Visit (HOSPITAL_COMMUNITY)
Admission: RE | Admit: 2019-04-24 | Discharge: 2019-04-24 | Disposition: A | Discharge status: Home / Self Care | Payer: PRIVATE HEALTH INSURANCE | Source: Ambulatory Visit | Attending: Internal Medicine | Admitting: Internal Medicine

## 2019-04-24 ENCOUNTER — Other Ambulatory Visit: Payer: Self-pay

## 2019-04-24 DIAGNOSIS — Z1231 Encounter for screening mammogram for malignant neoplasm of breast: Secondary | ICD-10-CM

## 2019-04-24 NOTE — Progress Notes (Signed)
For review.  Set up follow-up with Dr. Raliegh Ip or Lala Lund to discuss

## 2019-05-06 ENCOUNTER — Inpatient Hospital Stay (HOSPITAL_COMMUNITY): Payer: PRIVATE HEALTH INSURANCE | Attending: Internal Medicine

## 2019-05-06 ENCOUNTER — Other Ambulatory Visit (HOSPITAL_COMMUNITY): Payer: Self-pay

## 2019-05-06 ENCOUNTER — Other Ambulatory Visit: Payer: Self-pay

## 2019-05-06 ENCOUNTER — Ambulatory Visit (INDEPENDENT_AMBULATORY_CARE_PROVIDER_SITE_OTHER): Payer: PRIVATE HEALTH INSURANCE | Admitting: Family Medicine

## 2019-05-06 DIAGNOSIS — Z9221 Personal history of antineoplastic chemotherapy: Secondary | ICD-10-CM | POA: Diagnosis not present

## 2019-05-06 DIAGNOSIS — Z79899 Other long term (current) drug therapy: Secondary | ICD-10-CM | POA: Insufficient documentation

## 2019-05-06 DIAGNOSIS — Z853 Personal history of malignant neoplasm of breast: Secondary | ICD-10-CM | POA: Diagnosis not present

## 2019-05-06 DIAGNOSIS — E119 Type 2 diabetes mellitus without complications: Secondary | ICD-10-CM | POA: Diagnosis not present

## 2019-05-06 DIAGNOSIS — Z171 Estrogen receptor negative status [ER-]: Secondary | ICD-10-CM | POA: Insufficient documentation

## 2019-05-06 DIAGNOSIS — G629 Polyneuropathy, unspecified: Secondary | ICD-10-CM | POA: Diagnosis not present

## 2019-05-06 DIAGNOSIS — R7989 Other specified abnormal findings of blood chemistry: Secondary | ICD-10-CM | POA: Diagnosis not present

## 2019-05-06 DIAGNOSIS — C50911 Malignant neoplasm of unspecified site of right female breast: Secondary | ICD-10-CM

## 2019-05-06 DIAGNOSIS — Z7984 Long term (current) use of oral hypoglycemic drugs: Secondary | ICD-10-CM | POA: Insufficient documentation

## 2019-05-06 DIAGNOSIS — K219 Gastro-esophageal reflux disease without esophagitis: Secondary | ICD-10-CM | POA: Insufficient documentation

## 2019-05-06 DIAGNOSIS — Z923 Personal history of irradiation: Secondary | ICD-10-CM | POA: Diagnosis not present

## 2019-05-06 DIAGNOSIS — I10 Essential (primary) hypertension: Secondary | ICD-10-CM | POA: Insufficient documentation

## 2019-05-06 DIAGNOSIS — M5136 Other intervertebral disc degeneration, lumbar region: Secondary | ICD-10-CM | POA: Insufficient documentation

## 2019-05-06 DIAGNOSIS — R21 Rash and other nonspecific skin eruption: Secondary | ICD-10-CM | POA: Diagnosis not present

## 2019-05-06 DIAGNOSIS — E7800 Pure hypercholesterolemia, unspecified: Secondary | ICD-10-CM | POA: Insufficient documentation

## 2019-05-06 LAB — COMPREHENSIVE METABOLIC PANEL
ALT: 21 U/L (ref 0–44)
AST: 21 U/L (ref 15–41)
Albumin: 3.8 g/dL (ref 3.5–5.0)
Alkaline Phosphatase: 70 U/L (ref 38–126)
Anion gap: 13 (ref 5–15)
BUN: 14 mg/dL (ref 6–20)
CO2: 24 mmol/L (ref 22–32)
Calcium: 8.7 mg/dL — ABNORMAL LOW (ref 8.9–10.3)
Chloride: 98 mmol/L (ref 98–111)
Creatinine, Ser: 1.11 mg/dL — ABNORMAL HIGH (ref 0.44–1.00)
GFR calc Af Amer: >60 mL/min (ref >60)
GFR calc non Af Amer: 57 mL/min — ABNORMAL LOW (ref >60)
Glucose, Bld: 152 mg/dL — ABNORMAL HIGH (ref 70–99)
Potassium: 4 mmol/L (ref 3.5–5.1)
Sodium: 135 mmol/L (ref 135–145)
Total Bilirubin: 0.6 mg/dL (ref 0.3–1.2)
Total Protein: 7.6 g/dL (ref 6.5–8.1)

## 2019-05-06 LAB — CBC WITH DIFFERENTIAL/PLATELET
Abs Immature Granulocytes: 0.02 10*3/uL (ref 0.00–0.07)
Basophils Absolute: 0 10*3/uL (ref 0.0–0.1)
Basophils Relative: 1 %
Eosinophils Absolute: 0.2 10*3/uL (ref 0.0–0.5)
Eosinophils Relative: 2 %
HCT: 41.6 % (ref 36.0–46.0)
Hemoglobin: 12.6 g/dL (ref 12.0–15.0)
Immature Granulocytes: 0 %
Lymphocytes Relative: 32 %
Lymphs Abs: 2.3 10*3/uL (ref 0.7–4.0)
MCH: 27 pg (ref 26.0–34.0)
MCHC: 30.3 g/dL (ref 30.0–36.0)
MCV: 89.1 fL (ref 80.0–100.0)
Monocytes Absolute: 0.5 10*3/uL (ref 0.1–1.0)
Monocytes Relative: 7 %
Neutro Abs: 4.2 10*3/uL (ref 1.7–7.7)
Neutrophils Relative %: 58 %
Platelets: 224 10*3/uL (ref 150–400)
RBC: 4.67 MIL/uL (ref 3.87–5.11)
RDW: 15.9 % — ABNORMAL HIGH (ref 11.5–15.5)
WBC: 7.3 10*3/uL (ref 4.0–10.5)
nRBC: 0 % (ref 0.0–0.2)

## 2019-05-06 LAB — LACTATE DEHYDROGENASE: LDH: 127 U/L (ref 98–192)

## 2019-05-06 MED ORDER — DOXYCYCLINE HYCLATE 100 MG PO TABS: 100.0000 mg | ORAL_TABLET | Freq: Two times a day (BID) | ORAL | 0 refills | Status: DC

## 2019-05-06 NOTE — Progress Notes (Signed)
For review.  Please update ordering provider

## 2019-05-06 NOTE — Progress Notes (Signed)
   Subjective:    Patient ID: [name], female    DOB: [birth year], 53 y.o.   MRN: [id number] Audio plus video HPI  Patient states she found a tick on her breast yesterday. Patient states she removed the tick and has a pink knot at the site.  Virtual Visit via Video Note  I connected with [name] on 05/06/19 at  3:00 PM EDT by a video enabled telemedicine application and verified that I am speaking with the correct person using two identifiers.  Location: Patient: home Provider: office    I discussed the limitations of evaluation and management by telemedicine and the availability of in person appointments. The patient expressed understanding and agreed to proceed.  History of Present Illness:    Observations/Objective:   Assessment and Plan:   Follow Up Instructions:    I discussed the assessment and treatment plan with the patient. The patient was provided an opportunity to ask questions and all were answered. The patient agreed with the plan and demonstrated an understanding of the instructions.   The patient was advised to call back or seek an in-person evaluation if the symptoms worsen or if the condition fails to improve as anticipated.  I provided 14minutes of non-face-to-face time during this encounter.  Patient very concerned about the red indurated somewhat tender somewhat itchy expanding red patch at the site of a tick bite.  No obvious fever headache no rash elsewhere no chills   Review of Systems No headache, no major weight loss or weight gain, no chest pain no back pain abdominal pain no change in bowel habits complete ROS otherwise negative     Objective:   Physical Exam  Virtual      Assessment & Plan:  Impression tick bite now with secondary rash expanding doubt Lyme disease but could potentially represent localized infection.  We will choose an antibiotic that will cover both skin infection plus tickborne illness discussed with  patient

## 2019-05-08 ENCOUNTER — Other Ambulatory Visit (HOSPITAL_COMMUNITY)
Admission: RE | Admit: 2019-05-08 | Discharge: 2019-05-08 | Disposition: A | Discharge status: Home / Self Care | Payer: PRIVATE HEALTH INSURANCE | Source: Ambulatory Visit | Attending: Urology | Admitting: Urology

## 2019-05-08 ENCOUNTER — Ambulatory Visit (INDEPENDENT_AMBULATORY_CARE_PROVIDER_SITE_OTHER): Payer: PRIVATE HEALTH INSURANCE | Admitting: Urology

## 2019-05-08 DIAGNOSIS — N393 Stress incontinence (female) (male): Secondary | ICD-10-CM | POA: Diagnosis not present

## 2019-05-08 DIAGNOSIS — Z87440 Personal history of urinary (tract) infections: Secondary | ICD-10-CM | POA: Insufficient documentation

## 2019-05-08 DIAGNOSIS — R1084 Generalized abdominal pain: Secondary | ICD-10-CM

## 2019-05-08 DIAGNOSIS — N200 Calculus of kidney: Secondary | ICD-10-CM | POA: Diagnosis not present

## 2019-05-08 LAB — URINALYSIS, COMPLETE (UACMP) WITH MICROSCOPIC
Bilirubin Urine: NEGATIVE
Glucose, UA: 150 mg/dL — AB
Hgb urine dipstick: NEGATIVE
Ketones, ur: NEGATIVE mg/dL
Leukocytes,Ua: NEGATIVE
Nitrite: NEGATIVE
Protein, ur: NEGATIVE mg/dL
Specific Gravity, Urine: 1.017 (ref 1.005–1.030)
pH: 5 (ref 5.0–8.0)

## 2019-05-10 LAB — URINE CULTURE: Culture: >=100000 — AB

## 2019-05-12 ENCOUNTER — Other Ambulatory Visit: Payer: Self-pay

## 2019-05-13 ENCOUNTER — Inpatient Hospital Stay (HOSPITAL_BASED_OUTPATIENT_CLINIC_OR_DEPARTMENT_OTHER): Payer: PRIVATE HEALTH INSURANCE | Admitting: Hematology

## 2019-05-13 ENCOUNTER — Encounter (HOSPITAL_COMMUNITY): Payer: Self-pay | Admitting: Hematology

## 2019-05-13 VITALS — BP 127/72 | HR 84 | Temp 97.6°F | Resp 16 | Wt 256.6 lb

## 2019-05-13 DIAGNOSIS — E7800 Pure hypercholesterolemia, unspecified: Secondary | ICD-10-CM

## 2019-05-13 DIAGNOSIS — Z171 Estrogen receptor negative status [ER-]: Secondary | ICD-10-CM

## 2019-05-13 DIAGNOSIS — E119 Type 2 diabetes mellitus without complications: Secondary | ICD-10-CM

## 2019-05-13 DIAGNOSIS — R7989 Other specified abnormal findings of blood chemistry: Secondary | ICD-10-CM

## 2019-05-13 DIAGNOSIS — K219 Gastro-esophageal reflux disease without esophagitis: Secondary | ICD-10-CM

## 2019-05-13 DIAGNOSIS — Z923 Personal history of irradiation: Secondary | ICD-10-CM

## 2019-05-13 DIAGNOSIS — M5136 Other intervertebral disc degeneration, lumbar region: Secondary | ICD-10-CM

## 2019-05-13 DIAGNOSIS — Z9221 Personal history of antineoplastic chemotherapy: Secondary | ICD-10-CM | POA: Diagnosis not present

## 2019-05-13 DIAGNOSIS — G629 Polyneuropathy, unspecified: Secondary | ICD-10-CM

## 2019-05-13 DIAGNOSIS — Z79899 Other long term (current) drug therapy: Secondary | ICD-10-CM

## 2019-05-13 DIAGNOSIS — Z7984 Long term (current) use of oral hypoglycemic drugs: Secondary | ICD-10-CM

## 2019-05-13 DIAGNOSIS — C50911 Malignant neoplasm of unspecified site of right female breast: Secondary | ICD-10-CM

## 2019-05-13 DIAGNOSIS — Z853 Personal history of malignant neoplasm of breast: Secondary | ICD-10-CM

## 2019-05-13 DIAGNOSIS — I10 Essential (primary) hypertension: Secondary | ICD-10-CM

## 2019-05-13 MED ORDER — GABAPENTIN 300 MG PO CAPS: 300.0000 mg | ORAL_CAPSULE | Freq: Three times a day (TID) | ORAL | 11 refills | Status: DC

## 2019-05-13 NOTE — Progress Notes (Signed)
[name], [name]   CLINIC:  Medical Oncology/Hematology  PCP:  [name], [place] [address] [phone]   REASON FOR VISIT:  Follow-up for right breast cancer.  CURRENT THERAPY: Observation.  BRIEF ONCOLOGIC HISTORY:  Oncology History  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[name])  02/28/2011 Initial Diagnosis   Right needle core biopsy demonstrating Invasive ductal carcinoma of right breast   03/21/2011 Surgery   Right lumpectomy demonstrating a 3.8 cm invasive ductal carcinoma, grade III, no LVI, and 0/1 lymph nodes   05/11/2011 - 07/13/2011 Chemotherapy   AC x 4   08/03/2011 - 10/19/2011 Chemotherapy   Paclitaxel weekly x 12   11/07/2011 - 12/31/2011 Radiation Therapy     02/10/2015 Procedure   Hysteroscopy with dilation and curettage by Dr. Josefa [name].   02/10/2015 Pathology Results   Diagnosis Endometrium, curettage - ABUNDANT BLOOD AND DEGENERATIVE SECRETORY ENDOMETRIUM WITH STROMAL BREAKDOWN (VERY LIMITED MATERIAL). - INFLAMED SQUAMOUS EPITHELIUM AND ENDOCERVICAL MUCOSA, NO DYSPLASIA OR MALIGNANCY.      CANCER STAGING: Cancer Staging 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HCC) Staging form: Breast, AJCC 7th Edition - Clinical: Stage IIA (T2, N0, cM0) - Unsigned    INTERVAL HISTORY:  [name] 53 y.o. female returns for follow-up of right breast cancer.  She reports shooting pains in her arms and legs.  She reportedly started having these pains after chemotherapy.  She also has some numbness which is not constant.  She reports appetite and energy levels are 100%.  Denies any other pains.  Denies any fevers, night sweats or weight loss in the last 6 months.  No nausea, vomiting or diarrhea was reported.  No ER visits or hospitalizations.    REVIEW OF SYSTEMS:  Review of Systems  Neurological: Positive for numbness.  All other systems reviewed and are negative.     PAST MEDICAL/SURGICAL HISTORY:  Past Medical History:  Diagnosis Date  . Anxiety   . Blood transfusion without reported diagnosis 1999   due to heavy menses  . Breast cancer ([place]) 05/22/2011   03/01/11, Stage 2, s/p lumpectomy, chemo/xrt  . DDD (degenerative disc disease), lumbar   . Depression 06/22/2011  . Diverticulitis   . DM (diabetes mellitus) ([place]) 06/22/2011  . Genital warts   . GERD (gastroesophageal reflux disease) 06/22/2011  . Hx of adenomatous colonic polyps 10/2007   80m sigmoid tubular adenoma, FH colon cancer, mother in mid-50s  . Hypercholesterolemia 06/22/2011  . Hypertension 06/22/2011  . 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[place] 05/22/2011  . Iron deficiency anemia 03/25/2015  . Morbid obesity ([place]   . Shortness of breath   . 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 Vaginal bleeding 03/08/2014   Past Surgical History:  Procedure Laterality Date  . back surg x2    . BREAST LUMPECTOMY Right 2012  . CHOLECYSTECTOMY    . COLONOSCOPY  11/03/07   4-mm sessile polyp removed/small internal hemorrhoids/tubular adenoma, random colon bx negative for microscopic colitis  . COLONOSCOPY WITH PROPOFOL N/A 09/17/2016   Procedure: COLONOSCOPY WITH PROPOFOL;  Surgeon: RDaneil Dolin MD;  [facility];  Service: Endoscopy;  Laterality: N/A;  9:00 am  . COLONOSCOPY, ESOPHAGOGASTRODUODENOSCOPY (EGD) AND ESOPHAGEAL DILATION  01/2012   SLF: empiric esophageal dilation, gastritis with benign bx, hemorrhoids  . DILATATION & CURETTAGE/HYSTEROSCOPY WITH MYOSURE N/A 02/10/2015   Procedure: DILATATION &  CURETTAGE/HYSTEROSCOPY WITH MYOSURE;  Surgeon: Nunzio Cobbs, MD;  Location: [facility];  Service: Gynecology;  Laterality: N/A;  . MM BREAST STEREO BX*L*R/S     rt.  Marland Kitchen POLYPECTOMY  09/17/2016   Procedure: POLYPECTOMY;  Surgeon: Daneil Dolin, MD;  Location: [facility];  Service: Endoscopy;;  ascending colon  . PORT-A-CATH REMOVAL   05/26/2012   Procedure: REMOVAL PORT-A-CATH;  Surgeon: Jamesetta So, MD;  Location: [facility];  Service: General;  Laterality: N/A;  Minor Room  . PORTACATH PLACEMENT       SOCIAL HISTORY:  Social History   Socioeconomic History  . Marital status: Divorced    Spouse name: Not on file  . Number of children: 3  . Years of education: Not on file  . Highest education level: Not on file  Occupational History  . Occupation: child care    Employer: [employer]  Social Needs  . Financial resource strain: Not on file  . Food insecurity    Worry: Not on file    Inability: Not on file  . Transportation needs    Medical: Not on file    Non-medical: Not on file  Tobacco Use  . Smoking status: Never Smoker  . Smokeless tobacco: Never Used  Substance and Sexual Activity  . Alcohol use: No    Alcohol/week: 0.0 standard drinks  . Drug use: No  . Sexual activity: Yes    Partners: Male    Birth control/protection: None  Lifestyle  . Physical activity    Days per week: Not on file    Minutes per session: Not on file  . Stress: Not on file  Relationships  . Social Herbalist on phone: Not on file    Gets together: Not on file    Attends religious service: Not on file    Active member of club or organization: Not on file    Attends meetings of clubs or organizations: Not on file    Relationship status: Not on file  . Intimate partner violence    Fear of current or ex partner: Not on file    Emotionally abused: Not on file    Physically abused: Not on file    Forced sexual activity: Not on file  Other Topics Concern  . Not on file  Social History Narrative  . Not on file    FAMILY HISTORY:  Family History  Problem Relation Age of Onset  . Colon cancer Mother        mid-50s, died of metastatic disease  . Cancer Mother        liver  . Coronary artery disease Mother   . Diabetes type I Mother   . Peripheral vascular disease Mother        Carotid disease in  her 64s  . Coronary artery disease Father        CAD in his 66s  . Diabetes Father   . Diabetes type I Father   . Hypertension Father   . Bipolar disorder Sister   . Coronary artery disease Brother        MI in his 1s  . Hypertension Brother   . Hypertension Maternal Grandmother   . Hyperlipidemia Maternal Grandmother   . Stroke Maternal Grandmother   . Hypertension Maternal Grandfather   . Diabetes Maternal Grandfather   . Diabetes Paternal Grandmother   . Heart disease Paternal Grandfather     CURRENT MEDICATIONS:  Outpatient  Encounter Medications as of 05/13/2019  Medication Sig  . atorvastatin (LIPITOR) 40 MG tablet Take 1 tablet (40 mg total) by mouth daily.  . blood glucose meter kit and supplies KIT Dispense based on patient and insurance preference. TEST ONCE DAILY. DIABETES TYPE 2 ICD 10 CODE E11.9  . citalopram (CELEXA) 40 MG tablet Take 1 tablet (40 mg total) by mouth daily.  Marland Kitchen doxycycline (VIBRA-TABS) 100 MG tablet Take 1 tablet (100 mg total) by mouth 2 (two) times daily.  Marland Kitchen glipiZIDE (GLUCOTROL XL) 5 MG 24 hr tablet Take 1 tablet (5 mg total) by mouth daily with breakfast.  . lisinopril (PRINIVIL,ZESTRIL) 10 MG tablet Take 1 tablet (10 mg total) by mouth daily.  . metFORMIN (GLUCOPHAGE-XR) 750 MG 24 hr tablet Take 1 tablet (750 mg total) by mouth daily with breakfast.  . ondansetron (ZOFRAN) 8 MG tablet Take 1 tablet (8 mg total) by mouth every 8 (eight) hours as needed for nausea or vomiting.  . [DISCONTINUED] albuterol (PROVENTIL HFA;VENTOLIN HFA) 108 (90 Base) MCG/ACT inhaler Inhale 2 puffs into the lungs every 6 (six) hours as needed for wheezing or shortness of breath.  . furosemide (LASIX) 20 MG tablet Take one tablet qam prn (Patient not taking: Reported on 05/13/2019)  . gabapentin (NEURONTIN) 300 MG capsule Take 1 capsule (300 mg total) by mouth 3 (three) times daily.  . pantoprazole (PROTONIX) 40 MG tablet Take 1 tablet by mouth once daily  . [DISCONTINUED]  HYDROcodone-acetaminophen (NORCO/VICODIN) 5-325 MG tablet One po Q 4-6 hours prn pain. (Patient not taking: Reported on 04/01/2019)  . [DISCONTINUED] ondansetron (ZOFRAN) 4 MG tablet Take 1 tablet (4 mg total) by mouth every 8 (eight) hours as needed for nausea or vomiting. (Patient not taking: Reported on 04/01/2019)  . [DISCONTINUED] potassium chloride (KCL) 2 mEq/mL SOLN oral liquid Take 10 mL BID for 21 days, then 10 mL daily, PO   No facility-administered encounter medications on file as of 05/13/2019.     ALLERGIES:  Allergies  Allergen Reactions  . Metformin And Related Diarrhea  . Flagyl [Metronidazole] Hives and Itching  . Lansoprazole   . Pravastatin Other (See Comments)    Muscle aches     PHYSICAL EXAM:  ECOG Performance status: 1  Vitals:   05/13/19 1400  BP: 127/72  Pulse: 84  Resp: 16  Temp: 97.6 F (36.4 C)  SpO2: 98%   Filed Weights   05/13/19 1400  Weight: 256 lb 9 oz (116.4 kg)    Physical Exam Vitals signs reviewed.  Constitutional:      Appearance: Normal appearance.  Cardiovascular:     Rate and Rhythm: Normal rate and regular rhythm.     Heart sounds: Normal heart sounds.  Pulmonary:     Effort: Pulmonary effort is normal.     Breath sounds: Normal breath sounds.  Abdominal:     General: There is no distension.     Palpations: Abdomen is soft. There is no mass.  Musculoskeletal:        General: No swelling.  Skin:    General: Skin is warm.  Neurological:     General: No focal deficit present.     Mental Status: She is alert and oriented to person, place, and time.  Psychiatric:        Mood and Affect: Mood normal.        Behavior: Behavior normal.   Breast exam shows lumpectomy scar in the right breast is within normal limits.  Right breast is  small and contracted from radiation changes.  No palpable mass in bilateral breast.  No palpable adenopathy.   LABORATORY DATA:  I have reviewed the labs as listed.  CBC    Component Value  Date/Time   WBC 7.3 05/06/2019 1330   RBC 4.67 05/06/2019 1330   HGB 12.6 05/06/2019 1330   HGB 10.0 (L) 02/10/2015 0848   HCT 41.6 05/06/2019 1330   PLT 224 05/06/2019 1330   MCV 89.1 05/06/2019 1330   MCH 27.0 05/06/2019 1330   MCHC 30.3 05/06/2019 1330   RDW 15.9 (H) 05/06/2019 1330   LYMPHSABS 2.3 05/06/2019 1330   MONOABS 0.5 05/06/2019 1330   EOSABS 0.2 05/06/2019 1330   BASOSABS 0.0 05/06/2019 1330   CMP Latest Ref Rng & Units 05/06/2019 01/24/2019 10/20/2018  Glucose 70 - 99 mg/dL 152(H) 120(H) 122(H)  BUN 6 - 20 mg/dL '14 12 18  '$ Creatinine 0.44 - 1.00 mg/dL 1.11(H) 0.81 0.86  Sodium 135 - 145 mmol/L 135 139 140  Potassium 3.5 - 5.1 mmol/L 4.0 3.8 3.9  Chloride 98 - 111 mmol/L 98 103 102  CO2 22 - 32 mmol/L '24 25 31  '$ Calcium 8.9 - 10.3 mg/dL 8.7(L) 8.9 9.3  Total Protein 6.5 - 8.1 g/dL 7.6 8.0 7.2  Total Bilirubin 0.3 - 1.2 mg/dL 0.6 1.0 0.4  Alkaline Phos 38 - 126 U/L 70 56 -  AST 15 - 41 U/L '21 16 17  '$ ALT 0 - 44 U/L '21 21 19       '$ DIAGNOSTIC IMAGING:  I have independently reviewed the scans and discussed with the patient.    ASSESSMENT & PLAN:   Invasive ductal carcinoma of right breast 1.  Stage II (T2N0) right breast IDC: - Diagnosed in April 2012, ER negative, PR 2% positive, Ki-67 90%, HER-2 negative. - Status post lumpectomy followed by adjuvant chemotherapy with AC x4 followed by 12 weekly Taxol, completed on 10/19/2011. - Adjuvant breast radiation therapy completed on 12/31/2011. - She was recommended tamoxifen but she declined. - Physical exam today shows retracted right breast at the lumpectomy scar.  No palpable masses in bilateral breast.  No palpable adenopathy. -Mammogram on 04/24/2019 shows BI-RADS Category 1. -We reviewed her blood work with her.  She will be followed again in 1 year with repeat mammogram.  2.  Neuropathy: -She reports shooting pains in her arms and legs.  These pains started after chemotherapy. - She reports that these pains are  worse at nighttime and when she is not moving. - I have recommended a trial of gabapentin.  We discussed the side effects in detail.  We sent a prescription for gabapentin 300 mg 3 times a day.  3.  Mildly elevated creatinine: - Her latest blood work showed elevated creatinine of 1.11 which is a change from her baseline. - She is reportedly started on Lasix in May 2020. - She was told to stay well-hydrated and follow-up with her PMD.      Orders placed this encounter:  Orders Placed This Encounter  Procedures  . MM 3D SCREEN BREAST BILATERAL  . CBC with Differential  . Comprehensive metabolic panel  . Vitamin D 25 hydroxy      Derek Jack, MD [place] [phone]

## 2019-05-13 NOTE — Assessment & Plan Note (Signed)
1.  Stage II (T2N0) right breast IDC: - Diagnosed in April 2012, ER negative, PR 2% positive, Ki-67 90%, HER-2 negative. - Status post lumpectomy followed by adjuvant chemotherapy with AC x4 followed by 12 weekly Taxol, completed on 10/19/2011. - Adjuvant breast radiation therapy completed on 12/31/2011. - She was recommended tamoxifen but she declined. - Physical exam today shows retracted right breast at the lumpectomy scar.  No palpable masses in bilateral breast.  No palpable adenopathy. -Mammogram on 04/24/2019 shows BI-RADS Category 1. -We reviewed her blood work with her.  She will be followed again in 1 year with repeat mammogram.  2.  Neuropathy: -She reports shooting pains in her arms and legs.  These pains started after chemotherapy. - She reports that these pains are worse at nighttime and when she is not moving. - I have recommended a trial of gabapentin.  We discussed the side effects in detail.  We sent a prescription for gabapentin 300 mg 3 times a day.  3.  Mildly elevated creatinine: - Her latest blood work showed elevated creatinine of 1.11 which is a change from her baseline. - She is reportedly started on Lasix in May 2020. - She was told to stay well-hydrated and follow-up with her PMD.

## 2019-05-18 ENCOUNTER — Ambulatory Visit (HOSPITAL_COMMUNITY): Payer: PRIVATE HEALTH INSURANCE

## 2019-05-20 ENCOUNTER — Other Ambulatory Visit (HOSPITAL_COMMUNITY): Payer: Self-pay | Admitting: Urology

## 2019-05-20 DIAGNOSIS — R109 Unspecified abdominal pain: Secondary | ICD-10-CM

## 2019-05-20 DIAGNOSIS — N200 Calculus of kidney: Secondary | ICD-10-CM

## 2019-05-27 ENCOUNTER — Ambulatory Visit (HOSPITAL_COMMUNITY)
Admission: RE | Admit: 2019-05-27 | Discharge: 2019-05-27 | Disposition: A | Discharge status: Home / Self Care | Payer: PRIVATE HEALTH INSURANCE | Source: Ambulatory Visit | Attending: Urology | Admitting: Urology

## 2019-05-27 ENCOUNTER — Other Ambulatory Visit: Payer: Self-pay

## 2019-05-27 DIAGNOSIS — R109 Unspecified abdominal pain: Secondary | ICD-10-CM

## 2019-05-27 DIAGNOSIS — N200 Calculus of kidney: Secondary | ICD-10-CM | POA: Diagnosis not present

## 2019-06-03 ENCOUNTER — Telehealth: Payer: Self-pay | Admitting: *Deleted

## 2019-06-04 NOTE — Telephone Encounter (Signed)
error 

## 2019-06-11 ENCOUNTER — Other Ambulatory Visit: Payer: Self-pay

## 2019-06-13 ENCOUNTER — Other Ambulatory Visit: Payer: Self-pay

## 2019-06-13 ENCOUNTER — Emergency Department (HOSPITAL_COMMUNITY): Payer: PRIVATE HEALTH INSURANCE

## 2019-06-13 ENCOUNTER — Emergency Department (HOSPITAL_COMMUNITY)
Admission: EM | Admit: 2019-06-13 | Discharge: 2019-06-13 | Disposition: A | Discharge status: Home / Self Care | Payer: PRIVATE HEALTH INSURANCE | Attending: Emergency Medicine | Admitting: Emergency Medicine

## 2019-06-13 ENCOUNTER — Encounter (HOSPITAL_COMMUNITY): Payer: Self-pay | Admitting: Emergency Medicine

## 2019-06-13 DIAGNOSIS — Z79899 Other long term (current) drug therapy: Secondary | ICD-10-CM | POA: Diagnosis not present

## 2019-06-13 DIAGNOSIS — M545 Low back pain, unspecified: Secondary | ICD-10-CM

## 2019-06-13 DIAGNOSIS — E119 Type 2 diabetes mellitus without complications: Secondary | ICD-10-CM | POA: Insufficient documentation

## 2019-06-13 DIAGNOSIS — I10 Essential (primary) hypertension: Secondary | ICD-10-CM | POA: Diagnosis not present

## 2019-06-13 DIAGNOSIS — Z7984 Long term (current) use of oral hypoglycemic drugs: Secondary | ICD-10-CM | POA: Diagnosis not present

## 2019-06-13 DIAGNOSIS — R350 Frequency of micturition: Secondary | ICD-10-CM | POA: Insufficient documentation

## 2019-06-13 DIAGNOSIS — Z853 Personal history of malignant neoplasm of breast: Secondary | ICD-10-CM | POA: Diagnosis not present

## 2019-06-13 LAB — URINALYSIS, ROUTINE W REFLEX MICROSCOPIC
Bilirubin Urine: NEGATIVE
Glucose, UA: NEGATIVE mg/dL
Hgb urine dipstick: NEGATIVE
Ketones, ur: NEGATIVE mg/dL
Leukocytes,Ua: NEGATIVE
Nitrite: NEGATIVE
Protein, ur: NEGATIVE mg/dL
Specific Gravity, Urine: 1.013 (ref 1.005–1.030)
pH: 6 (ref 5.0–8.0)

## 2019-06-13 MED ORDER — HYDROCODONE-ACETAMINOPHEN 5-325 MG PO TABS
1.0000 | ORAL_TABLET | Freq: Once | ORAL | Status: AC
  Administered 2019-06-13: 1 via ORAL
  Filled 2019-06-13: qty 1

## 2019-06-13 MED ORDER — METHOCARBAMOL 500 MG PO TABS: 500.0000 mg | ORAL_TABLET | Freq: Two times a day (BID) | ORAL | 0 refills | Status: DC

## 2019-06-13 NOTE — ED Provider Notes (Signed)
[facility] EMERGENCY DEPARTMENT Provider Note   CSN: [id number] Arrival date & time: 06/13/19  1616    History   Chief Complaint Chief Complaint  Patient presents with  . Back Pain  . Urinary Frequency    HPI [name] is a 53 y.o. female with PMHx diabetes, GERD, HTN, breast cancer s/p lumpectomy and chemo/xrt in 2012 who presents to the ED today complaining of gradual onset, constant, worse, sharp, lower back pain that began yesterday. Pt reports she had back surgeries done in 2002 and always has some pain but states yesterday after attempting to do housework she started having worse pain than normal. Pt has been taking Tylenol without relief. She reports Ibuprofen tears up her stomach so she doesn't take NSAIDs. Pt mentions that she had a CT Renal Stone study done a couple of weeks ago after having similar pain back and was told she had degenerative changes in her back. She is supposed to take Gabapentin for her neuropathy but she states it makes her feel weird so she doesn't take it. Pt is also complaining of some urinary frequency for the past week. Denies fever, chills, abdominal pain, nausea, vomiting, dysuria, urinary retention, urinary or bowel incontinence, weakness or numbness, saddle anesthesias, or any other associated symptoms. No recent spinal manipulation. No IVDA. No hx chronic steroid use.        Past Medical History:  Diagnosis Date  . Anxiety   . Blood transfusion without reported diagnosis 1999   due to heavy menses  . Breast cancer ([place]) 05/22/2011   03/01/11, Stage 2, s/p lumpectomy, chemo/xrt  . DDD (degenerative disc disease), lumbar   . Depression 06/22/2011  . Diverticulitis   . DM (diabetes mellitus) ([place]) 06/22/2011  . Genital warts   . GERD (gastroesophageal reflux disease) 06/22/2011  . Hx of adenomatous colonic polyps 10/2007   12m sigmoid tubular adenoma, FH colon cancer, mother in mid-50s  . Hypercholesterolemia 06/22/2011  . Hypertension 06/22/2011   . 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 05/22/2011  . Iron deficiency anemia 03/25/2015  . Morbid obesity ([place]   . Shortness of breath   . 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 Vaginal bleeding 03/08/2014    Patient Active Problem List   Diagnosis Date Noted  . Central adiposity 04/12/2018  . Seasonal affective disorder ([place] 12/15/2017  . Chest pain 02/27/2017  . 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 Panic disorder 01/09/2017  . Rectal bleeding 09/06/2016  . Abdominal cramping 09/06/2016  . Fatty liver 09/06/2016  . Dehydration 12/15/2015  . Orthostatic dizziness 12/15/2015  . Nausea vomiting and diarrhea 12/15/2015  . Iron deficiency anemia 03/25/2015  . Excessive or frequent menstruation 04/08/2014  . Dysmenorrhea 04/08/2014  . H/O adenomatous polyp of colon 01/24/2012  . FH: colon cancer 01/24/2012  . Esophageal dysphagia 01/24/2012  . Chronic diarrhea 01/24/2012  . Depression 06/22/2011  . GERD (gastroesophageal reflux disease) 06/22/2011  . DM (diabetes mellitus) ([place] 06/22/2011  . Hypercholesterolemia 06/22/2011  . Hypertension 06/22/2011  . Obesity 06/22/2011  . 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[place] 05/22/2011    Past Surgical History:  Procedure Laterality Date  . back surg x2    . BREAST LUMPECTOMY Right 2012  . CHOLECYSTECTOMY    . COLONOSCOPY  11/03/07   4-mm sessile polyp removed/small internal hemorrhoids/tubular adenoma, random colon bx negative for microscopic colitis  . COLONOSCOPY WITH  PROPOFOL N/A 09/17/2016   Procedure: COLONOSCOPY WITH PROPOFOL;  Surgeon: Daneil Dolin, MD;  Location: [facility];  Service: Endoscopy;  Laterality: N/A;  9:00 am  . COLONOSCOPY, ESOPHAGOGASTRODUODENOSCOPY (EGD) AND ESOPHAGEAL DILATION  01/2012   SLF: empiric esophageal dilation, gastritis with benign bx, hemorrhoids  . DILATATION & CURETTAGE/HYSTEROSCOPY  WITH MYOSURE N/A 02/10/2015   Procedure: DILATATION & CURETTAGE/HYSTEROSCOPY WITH MYOSURE;  Surgeon: Nunzio Cobbs, MD;  Location: [place] [facility];  Service: Gynecology;  Laterality: N/A;  . MM BREAST STEREO BX*L*R/S     rt.  Marland Kitchen POLYPECTOMY  09/17/2016   Procedure: POLYPECTOMY;  Surgeon: Daneil Dolin, MD;  Location: [facility];  Service: Endoscopy;;  ascending colon  . PORT-A-CATH REMOVAL  05/26/2012   Procedure: REMOVAL PORT-A-CATH;  Surgeon: Jamesetta So, MD;  Location: [facility];  Service: General;  Laterality: N/A;  Minor Room  . PORTACATH PLACEMENT       OB History    Gravida  4   Para  3   Term  0   Preterm  0   AB  1   Living  3     SAB  0   TAB  0   Ectopic  0   Multiple  0   Live Births  3            Home Medications    Prior to Admission medications   Medication Sig Start Date End Date Taking? Authorizing Provider  atorvastatin (LIPITOR) 40 MG tablet Take 1 tablet (40 mg total) by mouth daily. 10/14/18  Yes Mikey Kirschner, MD  citalopram (CELEXA) 40 MG tablet Take 1 tablet (40 mg total) by mouth daily. 10/14/18  Yes Mikey Kirschner, MD  furosemide (LASIX) 20 MG tablet Take one tablet qam prn 04/01/19  Yes Luking, Elayne Snare, MD  gabapentin (NEURONTIN) 300 MG capsule Take 1 capsule (300 mg total) by mouth 3 (three) times daily. 05/13/19  Yes Roger Shelter, FNP  glipiZIDE (GLUCOTROL XL) 5 MG 24 hr tablet Take 1 tablet (5 mg total) by mouth daily with breakfast. 10/14/18  Yes Mikey Kirschner, MD  lisinopril (PRINIVIL,ZESTRIL) 10 MG tablet Take 1 tablet (10 mg total) by mouth daily. 10/14/18  Yes Mikey Kirschner, MD  metFORMIN (GLUCOPHAGE-XR) 750 MG 24 hr tablet Take 1 tablet (750 mg total) by mouth daily with breakfast. 04/01/19  Yes Luking, Elayne Snare, MD  ondansetron (ZOFRAN) 8 MG tablet Take 1 tablet (8 mg total) by mouth every 8 (eight) hours as needed for nausea or vomiting. 01/24/19  Yes Daleen Bo, MD  pantoprazole (PROTONIX) 40 MG tablet Take 1  tablet by mouth once daily 04/02/19  Yes Luking, Grace Bushy, MD  blood glucose meter kit and supplies KIT Dispense based on patient and insurance preference. TEST ONCE DAILY. DIABETES TYPE 2 ICD 10 CODE E11.9 08/26/18   Cheyenne Adas, NP  doxycycline (VIBRA-TABS) 100 MG tablet Take 1 tablet (100 mg total) by mouth 2 (two) times daily. 05/06/19   Mikey Kirschner, MD  methocarbamol (ROBAXIN) 500 MG tablet Take 1 tablet (500 mg total) by mouth 2 (two) times daily. 06/13/19   Eustaquio Maize, PA-C  potassium chloride (KCL) 2 mEq/mL SOLN oral liquid Take 10 mL BID for 21 days, then 10 mL daily, PO 09/21/11 05/30/18  Baird Cancer, PA-C    Family History Family History  Problem Relation Age of Onset  . Colon cancer Mother  mid-50s, died of metastatic disease  . Cancer Mother        liver  . Coronary artery disease Mother   . Diabetes type I Mother   . Peripheral vascular disease Mother        Carotid disease in her 30s  . Coronary artery disease Father        CAD in his 63s  . Diabetes Father   . Diabetes type I Father   . Hypertension Father   . Bipolar disorder Sister   . Coronary artery disease Brother        MI in his 5s  . Hypertension Brother   . Hypertension Maternal Grandmother   . Hyperlipidemia Maternal Grandmother   . Stroke Maternal Grandmother   . Hypertension Maternal Grandfather   . Diabetes Maternal Grandfather   . Diabetes Paternal Grandmother   . Heart disease Paternal Grandfather     Social History Social History   Tobacco Use  . Smoking status: Never Smoker  . Smokeless tobacco: Never Used  Substance Use Topics  . Alcohol use: No    Alcohol/week: 0.0 standard drinks  . Drug use: No     Allergies   Metformin and related, Flagyl [metronidazole], Lansoprazole, and Pravastatin   Review of Systems Review of Systems  Constitutional: Negative for chills and fever.  HENT: Negative for congestion.   Eyes: Negative for visual disturbance.   Respiratory: Negative for cough and shortness of breath.   Cardiovascular: Negative for chest pain.  Gastrointestinal: Negative for abdominal pain, constipation, diarrhea, nausea and vomiting.  Genitourinary: Positive for frequency. Negative for dysuria, vaginal bleeding and vaginal discharge.  Musculoskeletal: Positive for back pain.  Skin: Negative for rash.  Neurological: Negative for headaches.     Physical Exam Updated Vital Signs BP (!) 126/50 (BP Location: Left Arm)   Pulse 93   Temp 98.5 F (36.9 C) (Oral)   Resp 20   LMP 04/05/2016 Comment: spotting 11/2016 and 12/2016  Physical Exam Vitals signs and nursing note reviewed.  Constitutional:      Appearance: She is obese. She is not ill-appearing.  HENT:     Head: Normocephalic and atraumatic.  Eyes:     Conjunctiva/sclera: Conjunctivae normal.  Neck:     Musculoskeletal: Normal range of motion and neck supple.  Cardiovascular:     Rate and Rhythm: Normal rate and regular rhythm.     Pulses: Normal pulses.  Pulmonary:     Effort: Pulmonary effort is normal.     Breath sounds: Normal breath sounds. No wheezing, rhonchi or rales.  Abdominal:     Palpations: Abdomen is soft.     Tenderness: There is no abdominal tenderness. There is no right CVA tenderness, left CVA tenderness, guarding or rebound.  Musculoskeletal:     Comments: No C or T midline spinal tenderness on exam. Mild L midline spinal tenderness as well as right paraspinal tenderness. No bony step offs or deformities. ROM intact throughout. Strength 4/5 bilaterally. Sensation intact to dull and sharp. Negative SLR bilaterally. 2+ distal pulses.   Skin:    General: Skin is warm and dry.  Neurological:     Mental Status: She is alert.      ED Treatments / Results  Labs (all labs ordered are listed, but only abnormal results are displayed) Labs Reviewed  URINALYSIS, ROUTINE W REFLEX MICROSCOPIC    EKG None  Radiology Dg Lumbar Spine Complete   Result Date: 06/13/2019 CLINICAL DATA:  Low back pain, history  of back surgery EXAM: LUMBAR SPINE - COMPLETE 4+ VIEW COMPARISON:  CT abdomen and pelvis 05/27/2019 FINDINGS: Five non-rib-bearing lumbar vertebra. Multilevel facet degenerative changes lower lumbar spine. Mild scattered endplate spur formation of lower thoracic and lumbar spine. Vertebral body and disc space heights otherwise maintained. No fracture, subluxation, or bone destruction. SI joints preserved. IMPRESSION: Degenerative disc and facet disease changes of lower thoracic and lumbar spine. No acute abnormalities. Electronically Signed   By: Lavonia Dana M.D.   On: 06/13/2019 18:36    Procedures Procedures (including critical care time)  Medications Ordered in ED Medications  HYDROcodone-acetaminophen (NORCO/VICODIN) 5-325 MG per tablet 1 tablet (1 tablet Oral Given 06/13/19 1818)     Initial Impression / Assessment and Plan / ED Course  I have reviewed the triage vital signs and the nursing notes.  Pertinent labs & imaging results that were available during my care of the patient were reviewed by me and considered in my medical decision making (see chart for details).    53 year old female presenting to the ED with complaints of lower back pain since yesterday with urinary frequency x 1 week. Recently had CT renal stone study done as an outpatient; found small 3 mm stones and was also noted to have degenerative changes to her spine. She has hx of spinal surgeries in 2002. Reports that she is not sure if she passed the stones but this pain feels different to the pain she was having a couple of weeks ago when she has the study done. No red flags to suggest cauda equina today. Given pt recently had CT scan done do not feel she needs another one today. Will get plain film of L spine to evaluate hardward and ensure everything is place. Will also check U/A today. If hgb present may consider repeat CT renal stone study although may just  treat for kidney stones regardless. Pt without CVA tenderness on exam.   Urine clear today; no signs of infection or blood to suggest kidney stones. DG L spine with mild degenerative changes but otherwise unremarkable. Pt reports mildly improved pain with norco in the ED. Will send home with robaxin and have patient follow up with her PCP. Strict return precautions discussed. She is in agreement with plan at this time and stable for discharge home.        Final Clinical Impressions(s) / ED Diagnoses   Final diagnoses:  Acute right-sided low back pain without sciatica    ED Discharge Orders         Ordered    methocarbamol (ROBAXIN) 500 MG tablet  2 times daily     06/13/19 1859           Eustaquio Maize, PA-C 06/13/19 2216    Noemi Chapel, MD 06/17/19 [phone]

## 2019-06-13 NOTE — Discharge Instructions (Signed)
You were seen in the ED today for lower back pain An Xray of your spine showed some degenerative changes but was otherwise normal You may be having pain from muscle spasms/a pulled back Please take muscle relaxer as prescribed. 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 It is recommended to take them at night to help you sleep Continue taking Tylenol as needed for pain  Please follow up with your PCP Return to the ED for any worsening symptoms including fever, chills, numbness in your groin, urinary retention, or if you pee/poop on yourself

## 2019-06-13 NOTE — ED Triage Notes (Signed)
Patient complains of lower back pain that started yesterday. Patient states urinary frequency. She also states history of back pain and back surgery.

## 2019-06-15 ENCOUNTER — Other Ambulatory Visit: Payer: Self-pay

## 2019-06-15 ENCOUNTER — Ambulatory Visit: Payer: PRIVATE HEALTH INSURANCE | Admitting: Nurse Practitioner

## 2019-06-24 ENCOUNTER — Other Ambulatory Visit: Payer: Self-pay

## 2019-06-24 DIAGNOSIS — Z20822 Contact with and (suspected) exposure to covid-19: Secondary | ICD-10-CM

## 2019-06-25 LAB — NOVEL CORONAVIRUS, NAA: SARS-CoV-2, NAA: NOT DETECTED

## 2019-06-26 ENCOUNTER — Ambulatory Visit (INDEPENDENT_AMBULATORY_CARE_PROVIDER_SITE_OTHER): Payer: PRIVATE HEALTH INSURANCE | Admitting: Nurse Practitioner

## 2019-06-26 ENCOUNTER — Other Ambulatory Visit: Payer: Self-pay

## 2019-06-26 DIAGNOSIS — E119 Type 2 diabetes mellitus without complications: Secondary | ICD-10-CM | POA: Diagnosis not present

## 2019-06-26 NOTE — Progress Notes (Signed)
   Subjective:  VIRTUAL VISIT  Patient ID: [name], female    DOB: 03/10/1966, 53 y.o.   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  Sugar has been between 115-130.   covid test this week and test was negative per pt.   No concerns today.   Virtual Visit via Video Note  I connected with [name] on 06/26/19 at  3:00 PM EDT by a video enabled telemedicine application and verified that I am speaking with the correct person using two identifiers.  Location: Patient: home Provider: office   I discussed the limitations of evaluation and management by telemedicine and the availability of in person appointments. The patient expressed understanding and agreed to proceed.  History of Present Illness: Presents to have her paperwork completed for her job at daycare.  Fasting blood sugars have been running 115-130.  Has been doing much better on metformin XR, no further diarrhea.  Still struggling with her weight.  Has been eating less bread than usual.  Is due for her diabetes checkup and chronic disease management.   Observations/Objective: NAD.  Alert, oriented.  Cheerful affect.  Thoughts logical coherent and relevant.  Assessment and Plan: Problem List Items Addressed This Visit      Endocrine   DM (diabetes mellitus) ([place]) - Primary        Follow Up Instructions: Recommend follow-up office visit in the near future for diabetes and hypertension.  Patient agrees with this plan.  Also paperwork filled out today for her job.   I discussed the assessment and treatment plan with the patient. The patient was provided an opportunity to ask questions and all were answered. The patient agreed with the plan and demonstrated an understanding of the instructions.   The patient was advised to call back or seek an in-person evaluation if the symptoms worsen or if the condition fails to improve as anticipated.  I provided 15  minutes of non-face-to-face time during this encounter.     Review of Systems     Objective:   Physical Exam        Assessment & Plan:

## 2019-06-27 ENCOUNTER — Encounter: Payer: Self-pay | Admitting: Nurse Practitioner

## 2019-06-27 ENCOUNTER — Other Ambulatory Visit: Payer: Self-pay | Admitting: Family Medicine

## 2019-07-01 ENCOUNTER — Ambulatory Visit: Payer: PRIVATE HEALTH INSURANCE | Admitting: Family Medicine

## 2019-07-01 LAB — BASIC METABOLIC PANEL
BUN/Creatinine Ratio: 16 (ref 9–23)
BUN: 14 mg/dL (ref 6–24)
CO2: 25 mmol/L (ref 20–29)
Calcium: 9.5 mg/dL (ref 8.7–10.2)
Chloride: 102 mmol/L (ref 96–106)
Creatinine, Ser: 0.86 mg/dL (ref 0.57–1.00)
GFR calc Af Amer: 89 mL/min/{1.73_m2} (ref >59)
GFR calc non Af Amer: 77 mL/min/{1.73_m2} (ref >59)
Glucose: 112 mg/dL — ABNORMAL HIGH (ref 65–99)
Potassium: 4.5 mmol/L (ref 3.5–5.2)
Sodium: 144 mmol/L (ref 134–144)

## 2019-07-01 LAB — HEMOGLOBIN A1C
Est. average glucose Bld gHb Est-mCnc: 160 mg/dL
Hgb A1c MFr Bld: 7.2 % — ABNORMAL HIGH (ref 4.8–5.6)

## 2019-07-01 LAB — VITAMIN D 25 HYDROXY (VIT D DEFICIENCY, FRACTURES): Vit D, 25-Hydroxy: 25.4 ng/mL — ABNORMAL LOW (ref 30.0–100.0)

## 2019-07-03 ENCOUNTER — Encounter: Payer: Self-pay | Admitting: Nurse Practitioner

## 2019-07-03 ENCOUNTER — Other Ambulatory Visit: Payer: Self-pay

## 2019-07-03 ENCOUNTER — Ambulatory Visit (INDEPENDENT_AMBULATORY_CARE_PROVIDER_SITE_OTHER): Payer: PRIVATE HEALTH INSURANCE | Admitting: Nurse Practitioner

## 2019-07-03 VITALS — BP 110/70 | Temp 97.0°F | Ht 63.0 in | Wt 263.2 lb

## 2019-07-03 DIAGNOSIS — R300 Dysuria: Secondary | ICD-10-CM

## 2019-07-03 DIAGNOSIS — Z113 Encounter for screening for infections with a predominantly sexual mode of transmission: Secondary | ICD-10-CM

## 2019-07-03 LAB — POCT URINALYSIS DIPSTICK
Spec Grav, UA: 1.015 (ref 1.010–1.025)
pH, UA: 5 (ref 5.0–8.0)

## 2019-07-03 LAB — POCT UA - MICROSCOPIC ONLY
Bacteria, U Microscopic: 0
RBC, urine, microscopic: 0

## 2019-07-03 NOTE — Progress Notes (Signed)
Subjective:    Patient ID: [name], female    DOB: June 28, 1966, 53 y.o.   MRN: [id number]  Diabetes She presents for her follow-up diabetic visit. She has type 2 diabetes mellitus. Pertinent negatives for diabetes include no chest pain. Current diabetic treatments: metformin 750mg  one daily. She is compliant with treatment all of the time. Exercise: does some exercise, none this week due a fall this week. pain in back and right leg since fall. Home blood sugar record trend: 120 -150 fasting.  Patient drinks multiple cups of sweet tea throughout the day. However, she is trying to limit intake. Pt eats a lot of carbs, including pasta and rice. Patient having annual eye exams. Stopped taking glyburide due to weight gain. No family history of endocrine cancers. No personal history or pancreatitis.   Hypertension: Patient takes Lisinopril 10 mg daily with no missed dose. Pt limits salt intake. Has noticed some swelling in her ankle, however she did not take  as needed lasix. Additionally, pt has a little of SOB with exertion. Denies chest pain, palpitations, or orthopnea.  GERD: Controlled with pantoprazole  Dysuria: Complaining of dysuria for several days. Accompanied with urgency, slight odor, and whitish discharge. No new sexual partners per patient. Patient denies fevers, flank pain, pelvic pain, or chills.   Review of Systems  Constitutional: Negative for chills and fever.  Respiratory: Positive for shortness of breath. Negative for cough and wheezing.   Cardiovascular: Positive for leg swelling. Negative for chest pain.  Gastrointestinal: Negative for constipation, diarrhea, nausea and vomiting.  Genitourinary: Positive for dysuria and urgency. Negative for menstrual problem.       Objective:   Physical Exam Cardiovascular:     Rate and Rhythm: Normal rate and regular rhythm.     Pulses: Normal pulses.     Heart sounds: Normal heart sounds.  Pulmonary:     Effort: Pulmonary  effort is normal.     Breath sounds: Normal breath sounds.  Abdominal:     Tenderness: There is no right CVA tenderness or left CVA tenderness.  Genitourinary:    Comments: Pt deferred Musculoskeletal:     Right lower leg: Edema present.     Left lower leg: Edema present.  Skin:    Capillary Refill: Capillary refill takes less than 2 seconds.  Neurological:     Mental Status: She is alert.   1+ edema lower extremities Diabetic Foot Exam - Simple   Simple Foot Form Diabetic Foot exam was performed with the following findings: Yes 07/03/2019  1:20 PM  Visual Inspection No deformities, no ulcerations, no other skin breakdown bilaterally: Yes Sensation Testing Intact to touch and monofilament testing bilaterally: Yes Pulse Check Posterior Tibialis and Dorsalis pulse intact bilaterally: Yes Comments     Urine micro- 0-4 WBC HPF     Assessment & Plan:   1. Dysuria - POCT urinalysis dipstick - Chlamydia/Gonococcus/Trichomonas, NAA - Urine Culture  2. Routine screening for STI (sexually transmitted infection)  -Discussed in detail about lifestyle and dietary modifications to reduce hemoglobin A1c. Patient will try to limit carb intake and increase daily exercise. Discussed about GLP-1 medication for diabetes. Patient will check with insurance and see what GLP-1 her insurance will cover. Patient will call back to office. Blood pressure at goal.  Urine culture and STI testing pending. Discussed warning precautions with patient. If urinary symptoms worsen or accompanied with fever or chills please go to ER. Return in about 3 months (around 10/03/2019) for diabetes  check up. 25 minutes was spent with the patient.  This statement verifies that 25 minutes was indeed spent with the patient.  More than 50% of this visit-total duration of the visit-was spent in counseling and coordination of care. The issues that the patient came in for today as reflected in the diagnosis (s) please refer to  documentation for further details.

## 2019-07-03 NOTE — Patient Instructions (Addendum)
Please see if insurance covers one of the three GLP-1: Trulicity, Ozempic, and Victoza Contact office so med can be ordered

## 2019-07-06 ENCOUNTER — Other Ambulatory Visit: Payer: Self-pay | Admitting: Nurse Practitioner

## 2019-07-06 ENCOUNTER — Encounter: Payer: Self-pay | Admitting: Family Medicine

## 2019-07-06 MED ORDER — CIPROFLOXACIN HCL 500 MG PO TABS: 500.0000 mg | ORAL_TABLET | Freq: Two times a day (BID) | ORAL | 0 refills | Status: DC

## 2019-07-07 ENCOUNTER — Telehealth: Payer: Self-pay | Admitting: *Deleted

## 2019-07-07 LAB — URINE CULTURE

## 2019-07-07 NOTE — Telephone Encounter (Signed)
error 

## 2019-07-07 NOTE — Telephone Encounter (Signed)
Pt called insurance and states all four of the  meds you told her need PA. Please advise.

## 2019-07-08 LAB — CHLAMYDIA/GONOCOCCUS/TRICHOMONAS, NAA
Chlamydia by NAA: NEGATIVE
Gonococcus by NAA: NEGATIVE
Trich vag by NAA: NEGATIVE

## 2019-07-09 ENCOUNTER — Other Ambulatory Visit: Payer: Self-pay | Admitting: Nurse Practitioner

## 2019-07-09 MED ORDER — TRULICITY 0.75 MG/0.5ML ~~LOC~~ SOAJ: SUBCUTANEOUS | 2 refills | Status: DC

## 2019-07-09 NOTE — Telephone Encounter (Signed)
I will send in order then we do PA. Thanks

## 2019-07-15 ENCOUNTER — Ambulatory Visit: Payer: PRIVATE HEALTH INSURANCE | Admitting: Obstetrics and Gynecology

## 2019-07-16 ENCOUNTER — Telehealth: Payer: Self-pay | Admitting: *Deleted

## 2019-07-16 NOTE — Telephone Encounter (Signed)
Trulicity approved 2 pens monthly- 07/15/2019-07/14/2020

## 2019-07-21 ENCOUNTER — Telehealth: Payer: Self-pay | Admitting: Nurse Practitioner

## 2019-07-21 NOTE — Telephone Encounter (Signed)
Pt contacted office and informed us that she has been approved for Trulicity. Pt is wanting to know when should she begin taking this? Please advise. Thank you

## 2019-07-22 ENCOUNTER — Other Ambulatory Visit: Payer: Self-pay | Admitting: Nurse Practitioner

## 2019-07-22 NOTE — Telephone Encounter (Signed)
The starting dose was ordered on 8/20. She can go ahead and start any time. Thanks.

## 2019-07-23 NOTE — Telephone Encounter (Signed)
Left message to return call 

## 2019-07-24 NOTE — Telephone Encounter (Signed)
Pt.notified

## 2019-08-07 ENCOUNTER — Other Ambulatory Visit: Payer: Self-pay

## 2019-08-07 ENCOUNTER — Ambulatory Visit: Payer: PRIVATE HEALTH INSURANCE | Admitting: Urology

## 2019-08-07 DIAGNOSIS — Z20822 Contact with and (suspected) exposure to covid-19: Secondary | ICD-10-CM

## 2019-08-08 LAB — SPECIMEN STATUS REPORT

## 2019-08-08 LAB — NOVEL CORONAVIRUS, NAA: SARS-CoV-2, NAA: NOT DETECTED

## 2019-08-21 ENCOUNTER — Other Ambulatory Visit: Payer: Self-pay

## 2019-08-22 ENCOUNTER — Other Ambulatory Visit: Payer: PRIVATE HEALTH INSURANCE

## 2019-08-24 NOTE — Progress Notes (Signed)
53 y.o. [id number] Married Caucasian female here for annual exam.    No bleeding at all.  Having hot flashes.  Some pelvic cramping once a month but no bleeding.   Patient states a few weeks ago she was having some irritation near clitoral area. It seems some better now.  On Trulicity and having some watery diarrhea.  She will contact her PCP.   PCP:  Baltazar Apo, MD   Patient's last menstrual period was 04/05/2016.           Sexually active: Yes.    The current method of family planning is post menopausal status.    Exercising: No.  The patient does not participate in regular exercise at present. Smoker:  no  Health Maintenance: Pap: 01-31-18 Neg:Neg HR HPV,01-18-17 Neg:Neg HR HPV, 09-08-14 ASCUS:Pos HR HPV History of abnormal Pap:  Yes, 09-08-14 Ascus:Pos HR HPV with colposcopy revealing LGSIL on exocervical biopsy/2010abnormal pap with colposcopy but no treatment to cervix. Repeat pap was normal. 1990's had colposcopy with LEEP procedure to cervix. Had laser to condyloma in 1989 or 1990. MMG: 04-24-19 3D/Neg/density B/BiRads1 Colonoscopy:  2017 per patient polyp removed; 3 small hemorrhoids;next 5 years--family hx colon cancer--mom BMD:   n/a  Result  n/a TDaP:  2016 Gardasil:   n/a HIV: 01-18-17 NR Hep C: 01-18-17 Neg Screening Labs:  PCP. Flu vaccine and TB test done today.    reports that she has never smoked. She has never used smokeless tobacco. She reports that she does not drink alcohol or use drugs.  Past Medical History:  Diagnosis Date  . Anxiety   . Blood transfusion without reported diagnosis 1999   due to heavy menses  . Breast cancer ([place]) 05/22/2011   03/01/11, Stage 2, s/p lumpectomy, chemo/xrt  . DDD (degenerative disc disease), lumbar   . Depression 06/22/2011  . Diverticulitis   . DM (diabetes mellitus) ([place]) 06/22/2011  . Genital warts   . GERD (gastroesophageal reflux disease) 06/22/2011  . Hx of adenomatous colonic polyps 10/2007   40m sigmoid tubular  adenoma, FH colon cancer, mother in mid-50s  . Hypercholesterolemia 06/22/2011  . Hypertension 06/22/2011  . 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 05/22/[place]  . Iron deficiency anemia 03/25/2015  . Morbid obesity ([place]   . Shortness of breath   . 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 Vaginal bleeding 03/08/2014    Past Surgical History:  Procedure Laterality Date  . back surg x2    . BREAST LUMPECTOMY Right 2012  . CHOLECYSTECTOMY    . COLONOSCOPY  11/03/07   4-mm sessile polyp removed/small internal hemorrhoids/tubular adenoma, random colon bx negative for microscopic colitis  . COLONOSCOPY WITH PROPOFOL N/A 09/17/2016   Procedure: COLONOSCOPY WITH PROPOFOL;  Surgeon: RDaneil Dolin MD;  [facility];  Service: Endoscopy;  Laterality: N/A;  9:00 am  . COLONOSCOPY, ESOPHAGOGASTRODUODENOSCOPY (EGD) AND ESOPHAGEAL DILATION  01/2012   SLF: empiric esophageal dilation, gastritis with benign bx, hemorrhoids  . DILATATION & CURETTAGE/HYSTEROSCOPY WITH MYOSURE N/A 02/10/2015   Procedure: DILATATION & CURETTAGE/HYSTEROSCOPY WITH MYOSURE;  Surgeon: BNunzio Cobbs MD;  [facility];  Service: Gynecology;  Laterality: N/A;  . MM BREAST STEREO BX*L*R/S     rt.  .Marland KitchenPOLYPECTOMY  09/17/2016   Procedure: POLYPECTOMY;  Surgeon: RDaneil Dolin MD;  [facility];  Service: Endoscopy;;  ascending colon  . PORT-A-CATH REMOVAL  05/26/2012   Procedure: REMOVAL  PORT-A-CATH;  Surgeon: Jamesetta So, MD;  Location: [facility];  Service: General;  Laterality: N/A;  Minor Room  . PORTACATH PLACEMENT      Current Outpatient Medications  Medication Sig Dispense Refill  . atorvastatin (LIPITOR) 40 MG tablet Take 1 tablet (40 mg total) by mouth daily. 90 tablet 1  . blood glucose meter kit and supplies KIT Dispense based on patient and insurance preference. TEST ONCE DAILY. DIABETES TYPE 2 ICD 10 CODE E11.9 1 each 5  . citalopram (CELEXA) 40  MG tablet Take 1 tablet by mouth once daily 90 tablet 0  . Dulaglutide (TRULICITY) 3.82 NK/5.3ZJ SOPN Inject 0.75 mg subcu once a week. 4 pen 2  . furosemide (LASIX) 20 MG tablet Take one tablet qam prn 30 tablet 5  . lisinopril (PRINIVIL,ZESTRIL) 10 MG tablet Take 1 tablet (10 mg total) by mouth daily. 90 tablet 1  . metFORMIN (GLUCOPHAGE-XR) 750 MG 24 hr tablet Take 1 tablet (750 mg total) by mouth daily with breakfast. 30 tablet 5  . Microlet Lancets MISC USE 1 LANCET TO CHECK GLUCOSE ONCE DAILY    . pantoprazole (PROTONIX) 40 MG tablet Take 1 tablet by mouth once daily 90 tablet 0   No current facility-administered medications for this visit.     Family History  Problem Relation Age of Onset  . Colon cancer Mother        mid-50s, died of metastatic disease  . Cancer Mother        liver  . Coronary artery disease Mother   . Diabetes type I Mother   . Peripheral vascular disease Mother        Carotid disease in her 57s  . Coronary artery disease Father        CAD in his 102s  . Diabetes Father   . Diabetes type I Father   . Hypertension Father   . Bipolar disorder Sister   . Coronary artery disease Brother        MI in his 4s  . Hypertension Brother   . Hypertension Maternal Grandmother   . Hyperlipidemia Maternal Grandmother   . Stroke Maternal Grandmother   . Hypertension Maternal Grandfather   . Diabetes Maternal Grandfather   . Diabetes Paternal Grandmother   . Heart disease Paternal Grandfather     Review of Systems  All other systems reviewed and are negative.   Exam:   BP (!) 150/94 (Cuff Size: Large)   Pulse (!) 110   Temp (!) 97.3 F (36.3 C) (Temporal)   Resp (!) 22   Ht _0  (1.6 m)   Wt 259 lb (117.5 kg)   LMP 04/05/2016 Comment: spotting 11/2016 and 12/2016  BMI 45.88 kg/m     General appearance: alert, cooperative and appears stated age Head: normocephalic, without obvious abnormality, atraumatic Neck: no adenopathy, supple, symmetrical, trachea  midline and thyroid normal to inspection and palpation Lungs: clear to auscultation bilaterally Breasts: left - normal appearance other than patchy red plaques under breast fold., no masses or tenderness, No nipple retraction or dimpling, No nipple discharge or bleeding, No axillary adenopathy Right with scar and retraction along with radiation change.  No masses.  No nipple discharge or bleeding, No axillary adenopathy Heart: regular rate and rhythm Abdomen: soft, non-tender; no masses, no organomegaly Extremities: extremities normal, atraumatic, no cyanosis or edema Skin: skin color, texture, turgor normal. No rashes or lesions Lymph nodes: cervical, supraclavicular, and axillary nodes normal. Neurologic: grossly normal  Pelvic: External  genitalia:  no lesions              No abnormal inguinal nodes palpated.              Urethra:  normal appearing urethra with no masses, tenderness or lesions              Bartholins and Skenes: normal                 Vagina: normal appearing vagina with normal color and discharge, no lesions              Cervix: no lesions              Pap taken: No. Bimanual Exam:  Uterus:  normal size, contour, position, consistency, mobility, non-tender              Adnexa: no mass, fullness, tenderness              Rectal exam: Yes.  .  Confirms.              Anus:  normal sphincter tone, no lesions  Chaperone was present for exam.  Assessment:   Well woman visit with normal exam. Hx right breast cancer.  Hx LGSIL.  Prior LEEP.  Hx fibroids.  Possible adenomyosis. Postmenopausal female. Vaginal atrophy.  FH of colon cancer in mother.  FH CAD.  Pelvic cramping.  Menopausal symptoms.  [place] of flexural folds.  BMI 45.88.  Plan: Mammogram screening discussed. Self breast awareness reviewed. Pap and HR HPV in 2022 (3 year return, 5 year risk of CIN 3+ is 0.18%).  Guidelines for Calcium, Vitamin D, regular exercise program including cardiovascular and  weight bearing exercise. [place], E2.  Pelvic US.  Mycolog and Nystatin.  We discussed weight loss.  Follow up annually and prn.   After visit summary provided.

## 2019-08-25 ENCOUNTER — Other Ambulatory Visit: Payer: Self-pay

## 2019-08-25 ENCOUNTER — Ambulatory Visit (INDEPENDENT_AMBULATORY_CARE_PROVIDER_SITE_OTHER): Payer: PRIVATE HEALTH INSURANCE | Admitting: Obstetrics and Gynecology

## 2019-08-25 ENCOUNTER — Encounter: Payer: Self-pay | Admitting: Obstetrics and Gynecology

## 2019-08-25 ENCOUNTER — Telehealth: Payer: Self-pay | Admitting: Obstetrics and Gynecology

## 2019-08-25 ENCOUNTER — Other Ambulatory Visit (INDEPENDENT_AMBULATORY_CARE_PROVIDER_SITE_OTHER): Payer: PRIVATE HEALTH INSURANCE | Admitting: *Deleted

## 2019-08-25 VITALS — BP 150/94 | HR 110 | Temp 97.3°F | Resp 22 | Ht 63.0 in | Wt 259.0 lb

## 2019-08-25 DIAGNOSIS — Z23 Encounter for immunization: Secondary | ICD-10-CM

## 2019-08-25 DIAGNOSIS — Z01419 Encounter for gynecological examination (general) (routine) without abnormal findings: Secondary | ICD-10-CM

## 2019-08-25 DIAGNOSIS — R102 Pelvic and perineal pain unspecified side: Secondary | ICD-10-CM

## 2019-08-25 DIAGNOSIS — Z111 Encounter for screening for respiratory tuberculosis: Secondary | ICD-10-CM

## 2019-08-25 DIAGNOSIS — N951 Menopausal and female climacteric states: Secondary | ICD-10-CM | POA: Diagnosis not present

## 2019-08-25 MED ORDER — NYSTATIN 100000 UNIT/GM EX POWD: Freq: Three times a day (TID) | CUTANEOUS | 2 refills | Status: DC

## 2019-08-25 MED ORDER — NYSTATIN-TRIAMCINOLONE 100000-0.1 UNIT/GM-% EX CREA: 1.0000 "application " | TOPICAL_CREAM | Freq: Two times a day (BID) | CUTANEOUS | 0 refills | Status: DC

## 2019-08-25 NOTE — Patient Instructions (Signed)

## 2019-08-25 NOTE — Telephone Encounter (Signed)
Please place in pap recall for 2022.  She has a hx of LGSIL.

## 2019-08-26 LAB — ESTRADIOL: Estradiol: 29.1 pg/mL

## 2019-08-26 LAB — FOLLICLE STIMULATING HORMONE: FSH: 19.7 m[IU]/mL

## 2019-08-26 NOTE — Telephone Encounter (Signed)
36 recall entered for 08-24-21(3 years from last pap).

## 2019-08-27 LAB — TB SKIN TEST
Induration: 0 mm
TB Skin Test: NEGATIVE

## 2019-08-31 ENCOUNTER — Telehealth: Payer: Self-pay | Admitting: Obstetrics and Gynecology

## 2019-08-31 NOTE — Telephone Encounter (Signed)
Call placed to patient to review benefit for scheduled ultrasound appointment. Left voicemail message requesting a return call °

## 2019-08-31 NOTE — Telephone Encounter (Signed)
See previous phone note. Patient called back, stating she would like to keep her ultrasound appointment with Dr Quincy Simmonds on 09/03/2019. Appointment has been rescheduled to original appointment date of 09/03/2019 with Dr Quincy Simmonds. Patient is aware of the appointment date, arrival time and cancellation policy. No further questions.  Forwarding to Dr Quincy Simmonds for final review. Patient is agreeable to disposition. Will close encounter

## 2019-08-31 NOTE — Telephone Encounter (Signed)
Patient returned call. Review benefit for scheduled appointment. Patient advises she is just now returning to work after being out and not paid for two weeks. Patient states she will need to call back to reschedule.  Forwarding to Dr Quincy Simmonds for review.

## 2019-09-03 ENCOUNTER — Other Ambulatory Visit: Payer: Self-pay | Admitting: Obstetrics and Gynecology

## 2019-09-03 ENCOUNTER — Ambulatory Visit (INDEPENDENT_AMBULATORY_CARE_PROVIDER_SITE_OTHER): Payer: PRIVATE HEALTH INSURANCE | Admitting: Obstetrics and Gynecology

## 2019-09-03 ENCOUNTER — Other Ambulatory Visit: Payer: Self-pay

## 2019-09-03 ENCOUNTER — Other Ambulatory Visit: Payer: PRIVATE HEALTH INSURANCE

## 2019-09-03 ENCOUNTER — Ambulatory Visit (INDEPENDENT_AMBULATORY_CARE_PROVIDER_SITE_OTHER): Payer: PRIVATE HEALTH INSURANCE

## 2019-09-03 ENCOUNTER — Encounter: Payer: Self-pay | Admitting: Obstetrics and Gynecology

## 2019-09-03 ENCOUNTER — Other Ambulatory Visit: Payer: PRIVATE HEALTH INSURANCE | Admitting: Obstetrics and Gynecology

## 2019-09-03 VITALS — BP 144/92 | HR 76 | Temp 97.7°F | Ht 63.5 in | Wt 256.2 lb

## 2019-09-03 DIAGNOSIS — Z6841 Body Mass Index (BMI) 40.0 and over, adult: Secondary | ICD-10-CM

## 2019-09-03 DIAGNOSIS — R102 Pelvic and perineal pain: Secondary | ICD-10-CM | POA: Diagnosis not present

## 2019-09-03 DIAGNOSIS — D251 Intramural leiomyoma of uterus: Secondary | ICD-10-CM | POA: Diagnosis not present

## 2019-09-03 DIAGNOSIS — N912 Amenorrhea, unspecified: Secondary | ICD-10-CM | POA: Diagnosis not present

## 2019-09-03 DIAGNOSIS — N951 Menopausal and female climacteric states: Secondary | ICD-10-CM

## 2019-09-03 NOTE — Patient Instructions (Signed)

## 2019-09-03 NOTE — Progress Notes (Signed)
GYNECOLOGY  VISIT   HPI: 53 y.o.   Married  Caucasian  female   [phone] with Patient's last menstrual period was 04/05/2016.   here for pelvic ultrasound and possible endometrial biopsy.   Patient has pelvic cramping and hot flashes.  Her [place] was 19.7 and her E2 was 29.1 on 08/25/19.  She has not had a menstruation since 04/05/16.  GYNECOLOGIC HISTORY: Patient's last menstrual period was 04/05/2016. Contraception:  PMP Menopausal hormone therapy:  none Last mammogram:  04-24-19 3D/Neg/density B/BiRads1 Last pap smear: 01-31-18 Neg:Neg HR HPV,01-18-17 Neg:Neg HR HPV, 09-08-14 ASCUS:Pos HR HPV        OB History    Gravida  4   Para  3   Term  0   Preterm  0   AB  1   Living  3     SAB  0   TAB  0   Ectopic  0   Multiple  0   Live Births  3              Patient Active Problem List   Diagnosis Date Noted  . Central adiposity 04/12/2018  . Seasonal affective disorder ([place]) 12/15/2017  . Chest pain 02/27/2017  . 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 Panic disorder 01/09/2017  . Rectal bleeding 09/06/2016  . Abdominal cramping 09/06/2016  . Fatty liver 09/06/2016  . Dehydration 12/15/2015  . Orthostatic dizziness 12/15/2015  . Nausea vomiting and diarrhea 12/15/2015  . Iron deficiency anemia 03/25/2015  . Excessive or frequent menstruation 04/08/2014  . Dysmenorrhea 04/08/2014  . H/O adenomatous polyp of colon 01/24/2012  . FH: colon cancer 01/24/2012  . Esophageal dysphagia 01/24/2012  . Chronic diarrhea 01/24/2012  . Depression 06/22/2011  . GERD (gastroesophageal reflux disease) 06/22/2011  . DM (diabetes mellitus) ([place]) 06/22/2011  . Hypercholesterolemia 06/22/2011  . Hypertension 06/22/2011  . Obesity 06/22/2011  . 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 Anxiety   . Blood transfusion without reported diagnosis 1999   due to heavy  menses  . Breast cancer ([place]) 05/22/2011   03/01/11, Stage 2, s/p lumpectomy, chemo/xrt  . DDD (degenerative disc disease), lumbar   . Depression 06/22/2011  . Diverticulitis   . DM (diabetes mellitus) ([place]) 06/22/2011  . Genital warts   . GERD (gastroesophageal reflux disease) 06/22/2011  . Hx of adenomatous colonic polyps 10/2007   57m sigmoid tubular adenoma, FH colon cancer, mother in mid-50s  . Hypercholesterolemia 06/22/2011  . Hypertension 06/22/2011  . 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 05/22/2011  . Iron deficiency anemia 03/25/2015  . Morbid obesity ([place]   . Shortness of breath   . 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 Vaginal bleeding 03/08/2014    Past Surgical History:  Procedure Laterality Date  . back surg x2    . BREAST LUMPECTOMY Right 2012  . CHOLECYSTECTOMY    . COLONOSCOPY  11/03/07   4-mm sessile polyp removed/small internal hemorrhoids/tubular adenoma, random colon bx negative for microscopic colitis  . COLONOSCOPY WITH PROPOFOL N/A 09/17/2016   Procedure: COLONOSCOPY WITH PROPOFOL;  Surgeon: RDaneil Dolin MD;  [facility];  Service: Endoscopy;  Laterality: N/A;  9:00 am  . COLONOSCOPY, ESOPHAGOGASTRODUODENOSCOPY (EGD) AND ESOPHAGEAL DILATION  01/2012   SLF: empiric esophageal dilation, gastritis with benign bx, hemorrhoids  . DILATATION & CURETTAGE/HYSTEROSCOPY  WITH MYOSURE N/A 02/10/2015   Procedure: DILATATION & CURETTAGE/HYSTEROSCOPY WITH MYOSURE;  Surgeon: Nunzio Cobbs, MD;  Location: [facility];  Service: Gynecology;  Laterality: N/A;  . MM BREAST STEREO BX*L*R/S     rt.  Marland Kitchen POLYPECTOMY  09/17/2016   Procedure: POLYPECTOMY;  Surgeon: Daneil Dolin, MD;  [facility];  Service: Endoscopy;;  ascending colon  . PORT-A-CATH REMOVAL  05/26/2012   Procedure: REMOVAL PORT-A-CATH;  Surgeon: Jamesetta So, MD;  Location: [facility];  Service: General;  Laterality: N/A;  Minor Room  . PORTACATH  PLACEMENT      Current Outpatient Medications  Medication Sig Dispense Refill  . atorvastatin (LIPITOR) 40 MG tablet Take 1 tablet (40 mg total) by mouth daily. 90 tablet 1  . blood glucose meter kit and supplies KIT Dispense based on patient and insurance preference. TEST ONCE DAILY. DIABETES TYPE 2 ICD 10 CODE E11.9 1 each 5  . citalopram (CELEXA) 40 MG tablet Take 1 tablet by mouth once daily 90 tablet 0  . Dulaglutide (TRULICITY) 1.10 RP/5.9YV SOPN Inject 0.75 mg subcu once a week. 4 pen 2  . furosemide (LASIX) 20 MG tablet Take one tablet qam prn 30 tablet 5  . lisinopril (PRINIVIL,ZESTRIL) 10 MG tablet Take 1 tablet (10 mg total) by mouth daily. 90 tablet 1  . metFORMIN (GLUCOPHAGE-XR) 750 MG 24 hr tablet Take 1 tablet (750 mg total) by mouth daily with breakfast. 30 tablet 5  . Microlet Lancets MISC USE 1 LANCET TO CHECK GLUCOSE ONCE DAILY    . nystatin (MYCOSTATIN/NYSTOP) powder Apply topically 3 (three) times daily. Apply to affected area for up to 7 days 30 g 2  . nystatin-triamcinolone (MYCOLOG II) cream Apply 1 application topically 2 (two) times daily. Apply to affected area BID for up to 7 days. 60 g 0  . pantoprazole (PROTONIX) 40 MG tablet Take 1 tablet by mouth once daily 90 tablet 0   No current facility-administered medications for this visit.      ALLERGIES: Metformin and related, Flagyl [metronidazole], Lansoprazole, and Pravastatin  Family History  Problem Relation Age of Onset  . Colon cancer Mother        mid-50s, died of metastatic disease  . Cancer Mother        liver  . Coronary artery disease Mother   . Diabetes type I Mother   . Peripheral vascular disease Mother        Carotid disease in her 56s  . Coronary artery disease Father        CAD in his 14s  . Diabetes Father   . Diabetes type I Father   . Hypertension Father   . Bipolar disorder Sister   . Coronary artery disease Brother        MI in his 66s  . Hypertension Brother   . Hypertension  Maternal Grandmother   . Hyperlipidemia Maternal Grandmother   . Stroke Maternal Grandmother   . Hypertension Maternal Grandfather   . Diabetes Maternal Grandfather   . Diabetes Paternal Grandmother   . Heart disease Paternal Grandfather     Social History   Socioeconomic History  . Marital status: Married    Spouse name: Not on file  . Number of children: 3  . Years of education: Not on file  . Highest education level: Not on file  Occupational History  . Occupation: child care    Employer: [employer]  Social Needs  . Financial  resource strain: Not on file  . Food insecurity    Worry: Not on file    Inability: Not on file  . Transportation needs    Medical: Not on file    Non-medical: Not on file  Tobacco Use  . Smoking status: Never Smoker  . Smokeless tobacco: Never Used  Substance and Sexual Activity  . Alcohol use: No    Alcohol/week: 0.0 standard drinks  . Drug use: No  . Sexual activity: Yes    Partners: Male    Birth control/protection: None  Lifestyle  . Physical activity    Days per week: Not on file    Minutes per session: Not on file  . Stress: Not on file  Relationships  . Social Herbalist on phone: Not on file    Gets together: Not on file    Attends religious service: Not on file    Active member of club or organization: Not on file    Attends meetings of clubs or organizations: Not on file    Relationship status: Not on file  . Intimate partner violence    Fear of current or ex partner: Not on file    Emotionally abused: Not on file    Physically abused: Not on file    Forced sexual activity: Not on file  Other Topics Concern  . Not on file  Social History Narrative  . Not on file    Review of Systems  All other systems reviewed and are negative.   PHYSICAL EXAMINATION:    BP (!) 144/92 (Cuff Size: Large)   Pulse 76   Temp 97.7 F (36.5 C) (Temporal)   Ht 5' 3.5" (1.613 m)   Wt 256 lb 3.2 oz (116.2 kg)   LMP  04/05/2016 Comment: spotting 11/2016 and 12/2016  BMI 44.67 kg/m     General appearance: alert, cooperative and appears stated age   Pelvic US Uterus with 17 mm posterior myoma.  EMS 5.09 mm.  Ovaries normal. No free fluid.  EMB: Consent for procedure.  Rationale explained. Sterile prep with   Paracervical block with 1% lidocaine 6 cc, lot number    [id number], expiration April 2022 Tenaculum to anterior cervical lip. Pipelle passed to    7      cm twice.   Tissue to pathology.  Small sample. Minimal EBL. No complications.   Chaperone was present for exam.  ASSESSMENT  Amenorrhea.  Perimenopausal by hormonal testing.  Pelvic cramping.  BMI 44.57. Hx breast cancer.  PLAN  Discussion of fibroid and perimenopausal status by labs.  EMB done today to rule out hyperplasia. Final plan to follow.    An After Visit Summary was printed and given to the patient.  __15____ minutes face to face time of which over 50% was spent in counseling.

## 2019-09-06 ENCOUNTER — Encounter: Payer: Self-pay | Admitting: Family Medicine

## 2019-09-17 ENCOUNTER — Encounter

## 2019-09-17 ENCOUNTER — Ambulatory Visit: Payer: PRIVATE HEALTH INSURANCE | Admitting: Obstetrics and Gynecology

## 2019-09-24 ENCOUNTER — Other Ambulatory Visit: Payer: Self-pay | Admitting: Nurse Practitioner

## 2019-09-24 ENCOUNTER — Ambulatory Visit (INDEPENDENT_AMBULATORY_CARE_PROVIDER_SITE_OTHER): Payer: PRIVATE HEALTH INSURANCE | Admitting: Family Medicine

## 2019-09-24 ENCOUNTER — Other Ambulatory Visit: Payer: Self-pay | Admitting: Family Medicine

## 2019-09-24 ENCOUNTER — Other Ambulatory Visit: Payer: Self-pay

## 2019-09-24 DIAGNOSIS — J329 Chronic sinusitis, unspecified: Secondary | ICD-10-CM | POA: Diagnosis not present

## 2019-09-24 DIAGNOSIS — J310 Chronic rhinitis: Secondary | ICD-10-CM

## 2019-09-24 MED ORDER — DOXYCYCLINE HYCLATE 100 MG PO TABS: 100.0000 mg | ORAL_TABLET | Freq: Two times a day (BID) | ORAL | 0 refills | Status: DC

## 2019-09-24 MED ORDER — ALBUTEROL SULFATE HFA 108 (90 BASE) MCG/ACT IN AERS: 2.0000 | INHALATION_SPRAY | Freq: Four times a day (QID) | RESPIRATORY_TRACT | 0 refills | Status: DC | PRN

## 2019-09-24 NOTE — Progress Notes (Signed)
   Subjective:  Audio plus video  Patient ID: [name], female    DOB: 05/15/1966, 53 y.o.   MRN: [id number]  Cough This is a new problem. Associated symptoms comments: Cough, chest pain when breathing deep or coughing, no fever. She has tried nothing for the symptoms.   Virtual Visit via Telephone Note  I connected with [name] on 09/24/19 at  3:50 PM EST by telephone and verified that I am speaking with the correct person using two identifiers.  Location: Patient: home Provider: office   I discussed the limitations, risks, security and privacy concerns of performing an evaluation and management service by telephone and the availability of in person appointments. I also discussed with the patient that there may be a patient responsible charge related to this service. The patient expressed understanding and agreed to proceed.   History of Present Illness:    Observations/Objective:   Assessment and Plan:   Follow Up Instructions:    I discussed the assessment and treatment plan with the patient. The patient was provided an opportunity to ask questions and all were answered. The patient agreed with the plan and demonstrated an understanding of the instructions.   The patient was advised to call back or seek an in-person evaluation if the symptoms worsen or if the condition fails to improve as anticipated.  I provided 18 minutes of non-face-to-face time during this encounter.  Patient has had some potential contacts.  No known exposures to COVID-19 patients.  Started with almond congestion drainage.  Moved into the chest.  Sense of wheeziness at times.  Cough deep in the chest.  At times productive of some phlegm.  Also some sinus congestion    Review of Systems  Respiratory: Positive for cough.    No vomiting no diarrhea    Objective:   Physical Exam  Virtual      Assessment & Plan:  Impression rhinosinusitis/bronchitis with element of reactive  airways.  Plan albuterol 2 sprays 4 times daily as needed.  Doxycycline 100 twice daily 10 days.  Recommended coronavirus rationale discussed.  Warning signs discussed

## 2019-09-25 ENCOUNTER — Other Ambulatory Visit: Payer: Self-pay | Admitting: *Deleted

## 2019-09-25 DIAGNOSIS — Z20822 Contact with and (suspected) exposure to covid-19: Secondary | ICD-10-CM

## 2019-09-27 LAB — NOVEL CORONAVIRUS, NAA: SARS-CoV-2, NAA: NOT DETECTED

## 2019-10-14 ENCOUNTER — Encounter (HOSPITAL_COMMUNITY): Payer: Self-pay | Admitting: Emergency Medicine

## 2019-10-14 ENCOUNTER — Emergency Department (HOSPITAL_COMMUNITY): Payer: PRIVATE HEALTH INSURANCE

## 2019-10-14 ENCOUNTER — Emergency Department (HOSPITAL_COMMUNITY)
Admission: EM | Admit: 2019-10-14 | Discharge: 2019-10-15 | Disposition: A | Discharge status: Home / Self Care | Payer: PRIVATE HEALTH INSURANCE | Attending: Emergency Medicine | Admitting: Emergency Medicine

## 2019-10-14 ENCOUNTER — Other Ambulatory Visit: Payer: Self-pay

## 2019-10-14 DIAGNOSIS — I10 Essential (primary) hypertension: Secondary | ICD-10-CM | POA: Insufficient documentation

## 2019-10-14 DIAGNOSIS — E119 Type 2 diabetes mellitus without complications: Secondary | ICD-10-CM | POA: Insufficient documentation

## 2019-10-14 DIAGNOSIS — Z79899 Other long term (current) drug therapy: Secondary | ICD-10-CM | POA: Diagnosis not present

## 2019-10-14 DIAGNOSIS — M25562 Pain in left knee: Secondary | ICD-10-CM

## 2019-10-14 DIAGNOSIS — Z7984 Long term (current) use of oral hypoglycemic drugs: Secondary | ICD-10-CM | POA: Insufficient documentation

## 2019-10-14 MED ORDER — IBUPROFEN 400 MG PO TABS: 600.0000 mg | ORAL_TABLET | Freq: Once | ORAL | Status: DC

## 2019-10-14 MED ORDER — HYDROCODONE-ACETAMINOPHEN 5-325 MG PO TABS
1.0000 | ORAL_TABLET | Freq: Once | ORAL | Status: AC
  Administered 2019-10-14: 1 via ORAL
  Filled 2019-10-14: qty 1

## 2019-10-14 MED ORDER — IBUPROFEN 400 MG PO TABS
400.0000 mg | ORAL_TABLET | Freq: Once | ORAL | Status: AC
  Administered 2019-10-14: 400 mg via ORAL
  Filled 2019-10-14: qty 1

## 2019-10-14 MED ORDER — HYDROCODONE-ACETAMINOPHEN 5-325 MG PO TABS: 1.0000 | ORAL_TABLET | Freq: Four times a day (QID) | ORAL | 0 refills | Status: DC | PRN

## 2019-10-14 MED ORDER — METHOCARBAMOL 500 MG PO TABS: 500.0000 mg | ORAL_TABLET | Freq: Two times a day (BID) | ORAL | 0 refills | Status: DC

## 2019-10-14 MED ORDER — HYDROCODONE-ACETAMINOPHEN 5-325 MG PO TABS: 1.0000 | ORAL_TABLET | Freq: Once | ORAL | Status: DC

## 2019-10-14 NOTE — Discharge Instructions (Addendum)
You have been seen today for a knee injury. There were no acute abnormalities on the x-rays, including no sign of fracture or dislocation, however, there could be injuries to the soft tissues, such as the ligaments or tendons that are not seen on xrays. There could also be what are called occult fractures that are small fractures not seen on xray. Antiinflammatory medications: Take 600 mg of ibuprofen every 6 hours or 440 mg (over the counter dose) to 500 mg (prescription dose) of naproxen every 12 hours for the next 3 days. After this time, these medications may be used as needed for pain. Take these medications with food to avoid upset stomach. Choose only one of these medications, do not take them together. Acetaminophen (generic for Tylenol): Should you continue to have additional pain while taking the ibuprofen or naproxen, you may add in acetaminophen as needed. Your daily total maximum amount of acetaminophen from all sources should be limited to 4000mg /day for persons without liver problems, or 2000mg /day for those with liver problems. Vicodin: May take Vicodin (hydrocodone-acetaminophen) as needed for severe pain.   Do not drive or perform other dangerous activities while taking this medication as it can cause drowsiness as well as changes in reaction time and judgement.   Please note that each pill of Vicodin contains 325 mg of acetaminophen (generic for Tylenol) and the above dosage limits apply. Ice: May apply ice to the area over the next 24 hours for 15 minutes at a time to reduce swelling. Methocarbamol: Methocarbamol (generic for Robaxin) is a muscle relaxer and can help relieve stiff muscles or muscle spasms.  Do not drive or perform other dangerous activities while taking this medication as it can cause drowsiness as well as changes in reaction time and judgement. Elevation: Keep the extremity elevated as often as possible to reduce pain and inflammation. Support: Wear the knee immobilizer  for support and comfort. Wear this until pain resolves. You will be weight-bearing as tolerated, which means you can slowly start to put weight on the extremity and increase amount and frequency as pain allows.  Follow up: Follow-up with the orthopedic specialist within the next week or two. Return: Return to the ED for numbness, weakness, increasing pain, overall worsening symptoms, loss of function, or if symptoms are not improving, you have tried to follow up with the orthopedic specialist, and have been unable to do so.  For prescription assistance, may try using prescription discount sites or apps, such as goodrx.com

## 2019-10-14 NOTE — ED Triage Notes (Signed)
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 Patient states that the pain became worse over the course of the day and tonight.

## 2019-10-14 NOTE — ED Provider Notes (Signed)
[facility] EMERGENCY DEPARTMENT Provider Note   CSN: [id number] Arrival date & time: 10/14/19  2110     History   Chief Complaint Chief Complaint  Patient presents with   Knee Pain    left knee    HPI [name] is a 53 y.o. female.     HPI  [name] is a 53 y.o. female, with a history of DM, obesity, HTN, presenting to the ED with left knee pain beginning this morning.  As part of her job as a Emergency planning/management officer, she was lifting a patient, twisted, and felt a pop followed by pain in her left knee. The pain in her knee is throbbing, severe, radiating across the knee.  Pain is worse with ambulating.  She also endorses some possible laxity in the knee with ambulation, but states it does not feel unstable. Denies numbness, weakness, neck/back pain, deformity, or any other complaints.  Patient is followed by Dr. Luna Glasgow for orthopedics.  Past Medical History:  Diagnosis Date   Anxiety    Blood transfusion without reported diagnosis 1999   due to heavy menses   Breast cancer ([place]) 05/22/2011   03/01/11, Stage 2, s/p lumpectomy, chemo/xrt   DDD (degenerative disc disease), lumbar    Depression 06/22/2011   Diverticulitis    DM (diabetes mellitus) ([place]) 06/22/2011   Genital warts    GERD (gastroesophageal reflux disease) 06/22/2011   Hx of adenomatous colonic polyps 10/2007   80m sigmoid tubular adenoma, FH colon cancer, mother in mid-50s   Hypercholesterolemia 06/22/2011   Hypertension 06/22/2011   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 05/22/2011   Iron deficiency anemia 03/25/2015   Morbid obesity ([place]    Shortness of breath    STD (sexually transmitted disease)    HPV, 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   Vaginal bleeding 03/08/2014    Patient Active Problem List   Diagnosis Date Noted   Central adiposity 04/12/2018   Seasonal affective disorder ([place] 12/15/2017   Chest pain 02/27/2017   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   Panic disorder 01/09/2017   Rectal bleeding 09/06/2016   Abdominal cramping 09/06/2016   Fatty liver 09/06/2016   Dehydration 12/15/2015   Orthostatic dizziness 12/15/2015   Nausea vomiting and diarrhea 12/15/2015   Iron deficiency anemia 03/25/2015   Excessive or frequent menstruation 04/08/2014   Dysmenorrhea 04/08/2014   H/O adenomatous polyp of colon 01/24/2012   FH: colon cancer 01/24/2012   Esophageal dysphagia 01/24/2012   Chronic diarrhea 01/24/2012   Depression 06/22/2011   GERD (gastroesophageal reflux disease) 06/22/2011   DM (diabetes mellitus) ([place] 06/22/2011   Hypercholesterolemia 06/22/2011   Hypertension 06/22/2011   Obesity 06/22/2011   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    Past Surgical History:  Procedure Laterality Date   back surg x2     BREAST LUMPECTOMY Right 2012   CHOLECYSTECTOMY     COLONOSCOPY  11/03/07   4-mm sessile polyp removed/small internal hemorrhoids/tubular adenoma, random colon bx negative for microscopic colitis   COLONOSCOPY WITH PROPOFOL N/A 09/17/2016   Procedure: COLONOSCOPY WITH PROPOFOL;  Surgeon: RDaneil Dolin MD;  [facility];  Service: Endoscopy;  Laterality: N/A;  9:00 am   COLONOSCOPY, ESOPHAGOGASTRODUODENOSCOPY (EGD) AND ESOPHAGEAL DILATION  01/2012   SLF: empiric esophageal dilation, gastritis with benign bx, hemorrhoids   DILATATION & CURETTAGE/HYSTEROSCOPY WITH MYOSURE N/A 02/10/2015   Procedure: DILATATION & CURETTAGE/HYSTEROSCOPY WITH MYOSURE;  Surgeon: Nunzio Cobbs, MD;  Location: [facility];  Service: Gynecology;  Laterality: N/A;   MM BREAST STEREO BX*L*R/S     rt.   POLYPECTOMY  09/17/2016   Procedure: POLYPECTOMY;  Surgeon: Daneil Dolin, MD;  [facility];  Service: Endoscopy;;  ascending colon   PORT-A-CATH REMOVAL  05/26/2012   Procedure: REMOVAL PORT-A-CATH;  Surgeon: Jamesetta So, MD;   Location: [facility];  Service: General;  Laterality: N/A;  Minor Room   PORTACATH PLACEMENT       OB History    Gravida  4   Para  3   Term  0   Preterm  0   AB  1   Living  3     SAB  0   TAB  0   Ectopic  0   Multiple  0   Live Births  3            Home Medications    Prior to Admission medications   Medication Sig Start Date End Date Taking? Authorizing Provider  albuterol (VENTOLIN HFA) 108 (90 Base) MCG/ACT inhaler Inhale 2 puffs into the lungs every 6 (six) hours as needed for wheezing or shortness of breath. 09/24/19   Mikey Kirschner, MD  atorvastatin (LIPITOR) 40 MG tablet Take 1 tablet by mouth once daily 09/25/19   Nilda Simmer, NP  blood glucose meter kit and supplies KIT Dispense based on patient and insurance preference. TEST ONCE DAILY. DIABETES TYPE 2 ICD 10 CODE E11.9 08/26/18   Cheyenne Adas, NP  citalopram (CELEXA) 40 MG tablet Take 1 tablet by mouth once daily 09/25/19   Nilda Simmer, NP  doxycycline (VIBRA-TABS) 100 MG tablet Take 1 tablet (100 mg total) by mouth 2 (two) times daily. 09/24/19   Mikey Kirschner, MD  Dulaglutide (TRULICITY) 7.20 NO/7.0JG SOPN INJECT 1 SYRINGE SUBCUTANEOUSLY ONCE A WEEK 09/25/19   Nilda Simmer, NP  furosemide (LASIX) 20 MG tablet Take one tablet qam prn 04/01/19   Kathyrn Drown, MD  HYDROcodone-acetaminophen (NORCO/VICODIN) 5-325 MG tablet Take 1-2 tablets by mouth every 6 (six) hours as needed for severe pain. 10/14/19   Broady Lafoy C, PA-C  lisinopril (ZESTRIL) 10 MG tablet Take 1 tablet by mouth once daily 09/25/19   Nilda Simmer, NP  metFORMIN (GLUCOPHAGE-XR) 750 MG 24 hr tablet TAKE 1 TABLET BY MOUTH ONCE DAILY WITH BREAKFAST (STOP  GLIPIZIDE) 09/25/19   Nilda Simmer, NP  methocarbamol (ROBAXIN) 500 MG tablet Take 1 tablet (500 mg total) by mouth 2 (two) times daily. 10/14/19   Alichia Alridge C, PA-C  Microlet Lancets MISC USE 1 LANCET TO CHECK GLUCOSE ONCE DAILY 05/29/19   [provider]  nystatin (MYCOSTATIN/NYSTOP) powder Apply topically 3 (three) times daily. Apply to affected area for up to 7 days 08/25/19   Nunzio Cobbs, MD  nystatin-triamcinolone [facility] II) cream Apply 1 application topically 2 (two) times daily. Apply to affected area BID for up to 7 days. 08/25/19   Nunzio Cobbs, MD  pantoprazole (PROTONIX) 40 MG tablet Take 1 tablet by mouth once daily 09/25/19   Nilda Simmer, NP  potassium chloride (KCL) 2 mEq/mL SOLN oral liquid Take 10 mL BID for 21 days, then 10 mL daily, PO 09/21/11 05/30/18  Baird Cancer, PA-C    Family History Family History  Problem Relation Age of Onset   Colon cancer Mother  mid-50s, died of metastatic disease   Cancer Mother        liver   Coronary artery disease Mother    Diabetes type I Mother    Peripheral vascular disease Mother        Carotid disease in her 17s   Coronary artery disease Father        CAD in his 49s   Diabetes Father    Diabetes type I Father    Hypertension Father    Bipolar disorder Sister    Coronary artery disease Brother        MI in his 71s   Hypertension Brother    Hypertension Maternal Grandmother    Hyperlipidemia Maternal Grandmother    Stroke Maternal Grandmother    Hypertension Maternal Grandfather    Diabetes Maternal Grandfather    Diabetes Paternal Grandmother    Heart disease Paternal Grandfather     Social History Social History   Tobacco Use   Smoking status: Never Smoker   Smokeless tobacco: Never Used  Substance Use Topics   Alcohol use: No    Alcohol/week: 0.0 standard drinks   Drug use: No     Allergies   Metformin and related, Flagyl [metronidazole], Lansoprazole, and Pravastatin   Review of Systems Review of Systems  Musculoskeletal: Positive for arthralgias. Negative for back pain and neck pain.  Skin: Negative for color change.  Neurological: Negative for weakness and numbness.      Physical Exam Updated Vital Signs BP (!) 173/102 (BP Location: Left Arm)    Pulse 83    Temp 99 F (37.2 C) (Oral)    Resp 18    Ht '5\' 4"'$  (1.626 m)    Wt 108.9 kg    LMP 04/05/2016 Comment: spotting 11/2016 and 12/2016   SpO2 98%    BMI 41.20 kg/m   Physical Exam Vitals signs and nursing note reviewed.  Constitutional:      General: She is not in acute distress.    Appearance: She is well-developed. She is not diaphoretic.  HENT:     Head: Normocephalic and atraumatic.  Eyes:     Conjunctiva/sclera: Conjunctivae normal.  Neck:     Musculoskeletal: Neck supple.  Cardiovascular:     Rate and Rhythm: Normal rate and regular rhythm.     Pulses:          Dorsalis pedis pulses are 2+ on the left side.       Posterior tibial pulses are 2+ on the left side.  Pulmonary:     Effort: Pulmonary effort is normal.  Musculoskeletal:     Comments: Tenderness across the left knee with some associated swelling.  No color change, deformity, laxity.  Range of motion intact, though painful. No pain, tenderness, or pain with range of motion in the hip or ankle.  Skin:    General: Skin is warm and dry.     Coloration: Skin is not pale.  Neurological:     Mental Status: She is alert.     Comments: Sensation to light touch grossly intact in the left lower extremity. Strength 5/5 in the left knee and ankle. Patient is ambulatory with a limping gait.  Psychiatric:        Behavior: Behavior normal.      ED Treatments / Results  Labs (all labs ordered are listed, but only abnormal results are displayed) Labs Reviewed - No data to display  EKG None  Radiology Dg Knee Complete 4 Views Left  Result Date: 10/14/2019 CLINICAL DATA:  Heavy lifting with knee pain, initial encounter EXAM: LEFT KNEE - COMPLETE 4+ VIEW COMPARISON:  None. FINDINGS: Mild degenerative changes are noted in the medial joint space. Patellofemoral degenerative changes are noted as well. No acute fracture or dislocation  is noted. No joint effusion is seen. IMPRESSION: Mild degenerative change without acute abnormality. Electronically Signed   By: Inez Catalina M.D.   On: 10/14/2019 22:34    Procedures Procedures (including critical care time)  Medications Ordered in ED Medications  HYDROcodone-acetaminophen (NORCO/VICODIN) 5-325 MG per tablet 1 tablet (1 tablet Oral Given 10/14/19 2228)  ibuprofen (ADVIL) tablet 400 mg (400 mg Oral Given 10/14/19 2228)     Initial Impression / Assessment and Plan / ED Course  I have reviewed the triage vital signs and the nursing notes.  Pertinent labs & imaging results that were available during my care of the patient were reviewed by me and considered in my medical decision making (see chart for details).        Patient presents with left knee pain.  No evidence of neurovascular compromise. I was not able to reproduce any laxity in the knee, however, due to patient's report of possible laxity, I placed the patient in a knee immobilizer and provided crutches.  Orthopedic follow-up. The patient was given instructions for home care as well as return precautions. Patient voices understanding of these instructions, accepts the plan, and is comfortable with discharge.   Findings and plan of care discussed with Ezequiel Essex, MD. Dr. Wyvonnia Dusky personally evaluated and examined this patient.  Final Clinical Impressions(s) / ED Diagnoses   Final diagnoses:  Acute pain of left knee    ED Discharge Orders         Ordered    HYDROcodone-acetaminophen (NORCO/VICODIN) 5-325 MG tablet  Every 6 hours PRN     10/14/19 2307    methocarbamol (ROBAXIN) 500 MG tablet  2 times daily     10/14/19 2307           Layla Maw 10/16/19 0910    Ezequiel Essex, MD 10/16/19 [phone]

## 2019-10-22 ENCOUNTER — Ambulatory Visit: Payer: PRIVATE HEALTH INSURANCE | Admitting: Orthopaedic Surgery

## 2019-10-22 ENCOUNTER — Other Ambulatory Visit: Payer: Self-pay

## 2019-10-22 ENCOUNTER — Encounter: Payer: Self-pay | Admitting: Orthopaedic Surgery

## 2019-10-22 VITALS — BP 134/75 | HR 91 | Ht 64.0 in | Wt 253.0 lb

## 2019-10-22 DIAGNOSIS — M25562 Pain in left knee: Secondary | ICD-10-CM | POA: Diagnosis not present

## 2019-10-22 MED ORDER — HYDROCODONE-ACETAMINOPHEN 5-325 MG PO TABS: ORAL_TABLET | ORAL | 0 refills | Status: DC

## 2019-10-22 MED ORDER — NAPROXEN 500 MG PO TABS: 500.0000 mg | ORAL_TABLET | Freq: Two times a day (BID) | ORAL | 5 refills | Status: DC

## 2019-10-22 NOTE — Progress Notes (Signed)
Patient OT:LXBWIOMBT [name], female DOB:08-18-1966, 53 y.o. DHR:[id number]  Chief Complaint  Patient presents with  . Knee Pain    left     HPI  [name] is a 53 y.o. female who has developed left knee pain over the last two weeks.  She has had swelling and popping but no giving way.  She went to the ER on 10-14-2019.  X-rays showed DJD of the knee. She was given pain medicine and ibuprofen.  She has less pain but the knee still hurts.  She has no redness, no other joint pain.   Body mass index is 43.43 kg/m.  The patient meets the AMA guidelines for Morbid (severe) obesity with a BMI > 40.0 and I have recommended weight loss.   ROS  Review of Systems  Constitutional: Positive for activity change.  Musculoskeletal: Positive for arthralgias and myalgias.  Psychiatric/Behavioral: The patient is nervous/anxious.   All other systems reviewed and are negative.   All other systems reviewed and are negative.  The following is a summary of the past history medically, past history surgically, known current medicines, social history and family history.  This information is gathered electronically by the computer from prior information and documentation.  I review this each visit and have found including this information at this point in the chart is beneficial and informative.    Past Medical History:  Diagnosis Date  . Anxiety   . Blood transfusion without reported diagnosis 1999   due to heavy menses  . Breast cancer ([place]) 05/22/2011   03/01/11, Stage 2, s/p lumpectomy, chemo/xrt  . DDD (degenerative disc disease), lumbar   . Depression 06/22/2011  . Diverticulitis   . DM (diabetes mellitus) ([place]) 06/22/2011  . Genital warts   . GERD (gastroesophageal reflux disease) 06/22/2011  . Hx of adenomatous colonic polyps 10/2007   77m sigmoid tubular adenoma, FH colon cancer, mother in 54  . Hypercholesterolemia 06/22/2011  . Hypertension 06/22/2011  . Invasive ductal carcinoma of right  breast ([place] 05/22/2011  . Iron deficiency anemia 03/25/2015  . Morbid obesity ([place]   . Shortness of breath   . 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 Vaginal bleeding 03/08/2014    Past Surgical History:  Procedure Laterality Date  . back surg x2    . BREAST LUMPECTOMY Right 2012  . CHOLECYSTECTOMY    . COLONOSCOPY  11/03/07   4-mm sessile polyp removed/small internal hemorrhoids/tubular adenoma, random colon bx negative for microscopic colitis  . COLONOSCOPY WITH PROPOFOL N/A 09/17/2016   Procedure: COLONOSCOPY WITH PROPOFOL;  Surgeon: RDaneil Dolin MD;  [facility];  Service: Endoscopy;  Laterality: N/A;  9:00 am  . COLONOSCOPY, ESOPHAGOGASTRODUODENOSCOPY (EGD) AND ESOPHAGEAL DILATION  01/2012   SLF: empiric esophageal dilation, gastritis with benign bx, hemorrhoids  . DILATATION & CURETTAGE/HYSTEROSCOPY WITH MYOSURE N/A 02/10/2015   Procedure: DILATATION & CURETTAGE/HYSTEROSCOPY WITH MYOSURE;  Surgeon: BNunzio Cobbs MD;  [facility];  Service: Gynecology;  Laterality: N/A;  . MM BREAST STEREO BX*L*R/S     rt.  .Marland KitchenPOLYPECTOMY  09/17/2016   Procedure: POLYPECTOMY;  Surgeon: RDaneil Dolin MD;  [facility];  Service: Endoscopy;;  ascending colon  . PORT-A-CATH REMOVAL  05/26/2012   Procedure: REMOVAL PORT-A-CATH;  Surgeon: MJamesetta So MD;  Location: [facility];  Service: General;  Laterality: N/A;  Minor Room  . PORTACATH PLACEMENT  Family History  Problem Relation Age of Onset  . Colon cancer Mother        54, died of metastatic disease  . Cancer Mother        liver  . Coronary artery disease Mother   . Diabetes type I Mother   . Peripheral vascular disease Mother        Carotid disease in her 45s  . Coronary artery disease Father        CAD in his 48s  . Diabetes Father   . Diabetes type I Father   . Hypertension Father   . Bipolar disorder Sister   . Coronary artery disease Brother         MI in his 62s  . Hypertension Brother   . Hypertension Maternal Grandmother   . Hyperlipidemia Maternal Grandmother   . Stroke Maternal Grandmother   . Hypertension Maternal Grandfather   . Diabetes Maternal Grandfather   . Diabetes Paternal Grandmother   . Heart disease Paternal Grandfather     Social History Social History   Tobacco Use  . Smoking status: Never Smoker  . Smokeless tobacco: Never Used  Substance Use Topics  . Alcohol use: No    Alcohol/week: 0.0 standard drinks  . Drug use: No    Allergies  Allergen Reactions  . Metformin And Related Diarrhea  . Flagyl [Metronidazole] Hives and Itching  . Lansoprazole   . Pravastatin Other (See Comments)    Muscle aches    Current Outpatient Medications  Medication Sig Dispense Refill  . atorvastatin (LIPITOR) 40 MG tablet Take 1 tablet by mouth once daily 90 tablet 0  . blood glucose meter kit and supplies KIT Dispense based on patient and insurance preference. TEST ONCE DAILY. DIABETES TYPE 2 ICD 10 CODE E11.9 1 each 5  . citalopram (CELEXA) 40 MG tablet Take 1 tablet by mouth once daily 90 tablet 0  . Dulaglutide (TRULICITY) 5.59 RC/1.6LA SOPN INJECT 1 SYRINGE SUBCUTANEOUSLY ONCE A WEEK 4 mL 0  . HYDROcodone-acetaminophen (NORCO/VICODIN) 5-325 MG tablet Take 1-2 tablets by mouth every 6 (six) hours as needed for severe pain. 15 tablet 0  . lisinopril (ZESTRIL) 10 MG tablet Take 1 tablet by mouth once daily 90 tablet 0  . metFORMIN (GLUCOPHAGE-XR) 750 MG 24 hr tablet TAKE 1 TABLET BY MOUTH ONCE DAILY WITH BREAKFAST (STOP  GLIPIZIDE) 90 tablet 0  . methocarbamol (ROBAXIN) 500 MG tablet Take 1 tablet (500 mg total) by mouth 2 (two) times daily. 20 tablet 0  . Microlet Lancets MISC USE 1 LANCET TO CHECK GLUCOSE ONCE DAILY    . pantoprazole (PROTONIX) 40 MG tablet Take 1 tablet by mouth once daily 90 tablet 0  . albuterol (VENTOLIN HFA) 108 (90 Base) MCG/ACT inhaler Inhale 2 puffs into the lungs every 6 (six) hours as  needed for wheezing or shortness of breath. 18 g 0  . doxycycline (VIBRA-TABS) 100 MG tablet Take 1 tablet (100 mg total) by mouth 2 (two) times daily. 20 tablet 0  . furosemide (LASIX) 20 MG tablet Take one tablet qam prn 30 tablet 5  . nystatin (MYCOSTATIN/NYSTOP) powder Apply topically 3 (three) times daily. Apply to affected area for up to 7 days 30 g 2  . nystatin-triamcinolone (MYCOLOG II) cream Apply 1 application topically 2 (two) times daily. Apply to affected area BID for up to 7 days. 60 g 0   No current facility-administered medications for this visit.      Physical Exam  Blood pressure 134/75, pulse 91, height '5\' 4"'$  (1.626 m), weight 253 lb (114.8 kg), last menstrual period 04/05/2016.  Constitutional: overall normal hygiene, normal nutrition, well developed, normal grooming, normal body habitus. Assistive device:none  Musculoskeletal: gait and station Limp left, muscle tone and strength are normal, no tremors or atrophy is present.  .  Neurological: coordination overall normal.  Deep tendon reflex/nerve stretch intact.  Sensation normal.  Cranial nerves II-XII intact.   Skin:   Normal overall no scars, lesions, ulcers or rashes. No psoriasis.  Psychiatric: Alert and oriented x 3.  Recent memory intact, remote memory unclear.  Normal mood and affect. Well groomed.  Good eye contact.  Cardiovascular: overall no swelling, no varicosities, no edema bilaterally, normal temperatures of the legs and arms, no clubbing, cyanosis and good capillary refill.  Lymphatic: palpation is normal.  Left knee is tender medially, effusion, ROM 0 to 105, stable, NV intact.  Limp left.  All other systems reviewed and are negative   The patient has been educated about the nature of the problem(s) and counseled on treatment options.  The patient appeared to understand what I have discussed and is in agreement with it.  Encounter Diagnosis  Name Primary?  . Acute pain of left knee Yes    PROCEDURE NOTE:  The patient requests injections of the left knee , verbal consent was obtained.  The left knee was prepped appropriately after time out was performed.   Sterile technique was observed and injection of 1 cc of Depo-Medrol 40 mg with several cc's of plain xylocaine. Anesthesia was provided by ethyl chloride and a 20-gauge needle was used to inject the knee area. The injection was tolerated well.  A band aid dressing was applied.  The patient was advised to apply ice later today and tomorrow to the injection sight as needed.   PLAN Call if any problems.  Precautions discussed.  Continue current medications. I will add Naprosyn and pain medicine.  Return to clinic November 09, 2019   I have reviewed the [place] web site prior to prescribing narcotic medicine for this patient.   Electronically Signed Sanjuana Kava, MD 12/3/20201:54 PM

## 2019-10-25 ENCOUNTER — Other Ambulatory Visit: Payer: Self-pay | Admitting: Nurse Practitioner

## 2019-11-02 ENCOUNTER — Other Ambulatory Visit: Payer: Self-pay

## 2019-11-02 MED ORDER — HYDROCODONE-ACETAMINOPHEN 5-325 MG PO TABS: ORAL_TABLET | ORAL | 0 refills | Status: DC

## 2019-11-02 NOTE — Telephone Encounter (Signed)
Dr. Marthann Schiller patient  Hydrocodone-Acetaminophen  5/325mg   Qty 30 Tablets  One tablet every four hours as needed for acute pain. Limit of five days per [place] statue.  PATIENT USES [place]

## 2019-11-10 ENCOUNTER — Ambulatory Visit: Payer: PRIVATE HEALTH INSURANCE | Admitting: Orthopaedic Surgery

## 2019-11-17 ENCOUNTER — Encounter: Payer: Self-pay | Admitting: Orthopaedic Surgery

## 2019-11-17 ENCOUNTER — Ambulatory Visit (INDEPENDENT_AMBULATORY_CARE_PROVIDER_SITE_OTHER): Payer: PRIVATE HEALTH INSURANCE | Admitting: Orthopaedic Surgery

## 2019-11-17 ENCOUNTER — Other Ambulatory Visit: Payer: Self-pay

## 2019-11-17 VITALS — Temp 97.0°F | Ht 64.0 in | Wt 248.0 lb

## 2019-11-17 DIAGNOSIS — Z6841 Body Mass Index (BMI) 40.0 and over, adult: Secondary | ICD-10-CM

## 2019-11-17 DIAGNOSIS — M7062 Trochanteric bursitis, left hip: Secondary | ICD-10-CM | POA: Diagnosis not present

## 2019-11-17 DIAGNOSIS — M25562 Pain in left knee: Secondary | ICD-10-CM | POA: Diagnosis not present

## 2019-11-17 MED ORDER — HYDROCODONE-ACETAMINOPHEN 5-325 MG PO TABS: ORAL_TABLET | ORAL | 0 refills | Status: DC

## 2019-11-17 NOTE — Progress Notes (Addendum)
Patient HL:[id number] [name], female DOB:05-27-1966, 53 y.o. HTD:[id number]  Chief Complaint  Patient presents with  . Hip Pain    Feels like it's starting in the hip now and moving down the leg    HPI  [name] is a 53 y.o. female who has left knee pain.  The knee is better after the injection but she has developed pain of the lateral left hip over the bursa area. She has no new falls or trauma.  She is taking her Naprosyn and pain medicine.   Body mass index is 42.57 kg/m.  The patient meets the AMA guidelines for Morbid (severe) obesity with a BMI > 40.0 and I have recommended weight loss.   ROS  Review of Systems  Constitutional: Positive for activity change.  Musculoskeletal: Positive for arthralgias and myalgias.  Psychiatric/Behavioral: The patient is nervous/anxious.   All other systems reviewed and are negative.   All other systems reviewed and are negative.  The following is a summary of the past history medically, past history surgically, known current medicines, social history and family history.  This information is gathered electronically by the computer from prior information and documentation.  I review this each visit and have found including this information at this point in the chart is beneficial and informative.    Past Medical History:  Diagnosis Date  . Anxiety   . Blood transfusion without reported diagnosis 1999   due to heavy menses  . Breast cancer ([place]) 05/22/2011   03/01/11, Stage 2, s/p lumpectomy, chemo/xrt  . DDD (degenerative disc disease), lumbar   . Depression 06/22/2011  . Diverticulitis   . DM (diabetes mellitus) ([place]) 06/22/2011  . Genital warts   . GERD (gastroesophageal reflux disease) 06/22/2011  . Hx of adenomatous colonic polyps 10/2007   30m sigmoid tubular adenoma, FH colon cancer, mother in mid-50s  . Hypercholesterolemia 06/22/2011  . Hypertension 06/22/2011  . 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[place] 05/22/2011  . Iron  deficiency anemia 03/25/2015  . Morbid obesity ([place]   . Shortness of breath   . 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 Vaginal bleeding 03/08/2014    Past Surgical History:  Procedure Laterality Date  . back surg x2    . BREAST LUMPECTOMY Right 2012  . CHOLECYSTECTOMY    . COLONOSCOPY  11/03/07   4-mm sessile polyp removed/small internal hemorrhoids/tubular adenoma, random colon bx negative for microscopic colitis  . COLONOSCOPY WITH PROPOFOL N/A 09/17/2016   Procedure: COLONOSCOPY WITH PROPOFOL;  Surgeon: RDaneil Dolin MD;  [facility];  Service: Endoscopy;  Laterality: N/A;  9:00 am  . COLONOSCOPY, ESOPHAGOGASTRODUODENOSCOPY (EGD) AND ESOPHAGEAL DILATION  01/2012   SLF: empiric esophageal dilation, gastritis with benign bx, hemorrhoids  . DILATATION & CURETTAGE/HYSTEROSCOPY WITH MYOSURE N/A 02/10/2015   Procedure: DILATATION & CURETTAGE/HYSTEROSCOPY WITH MYOSURE;  Surgeon: BNunzio Cobbs MD;  [facility];  Service: Gynecology;  Laterality: N/A;  . MM BREAST STEREO BX*L*R/S     rt.  .Marland KitchenPOLYPECTOMY  09/17/2016   Procedure: POLYPECTOMY;  Surgeon: RDaneil Dolin MD;  [facility];  Service: Endoscopy;;  ascending colon  . PORT-A-CATH REMOVAL  05/26/2012   Procedure: REMOVAL PORT-A-CATH;  Surgeon: MJamesetta So MD;  Location: [facility];  Service: General;  Laterality: N/A;  Minor Room  . PORTACATH PLACEMENT      Family History  Problem Relation Age of Onset  .  Colon cancer Mother        mid-50s, died of metastatic disease  . Cancer Mother        liver  . Coronary artery disease Mother   . Diabetes type I Mother   . Peripheral vascular disease Mother        Carotid disease in her 34s  . Coronary artery disease Father        CAD in his 48s  . Diabetes Father   . Diabetes type I Father   . Hypertension Father   . Bipolar disorder Sister   . Coronary artery disease Brother        MI in his 20s  .  Hypertension Brother   . Hypertension Maternal Grandmother   . Hyperlipidemia Maternal Grandmother   . Stroke Maternal Grandmother   . Hypertension Maternal Grandfather   . Diabetes Maternal Grandfather   . Diabetes Paternal Grandmother   . Heart disease Paternal Grandfather     Social History Social History   Tobacco Use  . Smoking status: Never Smoker  . Smokeless tobacco: Never Used  Substance Use Topics  . Alcohol use: No    Alcohol/week: 0.0 standard drinks  . Drug use: No    Allergies  Allergen Reactions  . Metformin And Related Diarrhea  . Flagyl [Metronidazole] Hives and Itching  . Lansoprazole   . Pravastatin Other (See Comments)    Muscle aches    Current Outpatient Medications  Medication Sig Dispense Refill  . atorvastatin (LIPITOR) 40 MG tablet Take 1 tablet by mouth once daily 90 tablet 0  . blood glucose meter kit and supplies KIT Dispense based on patient and insurance preference. TEST ONCE DAILY. DIABETES TYPE 2 ICD 10 CODE E11.9 1 each 5  . citalopram (CELEXA) 40 MG tablet Take 1 tablet by mouth once daily 90 tablet 0  . Dulaglutide (TRULICITY) 1.74 BS/4.9QP SOPN INJECT 1 SYRINGLE SUBCUTANEOUSLY ONCE A WEEK 4 mL 3  . lisinopril (ZESTRIL) 10 MG tablet Take 1 tablet by mouth once daily 90 tablet 0  . metFORMIN (GLUCOPHAGE-XR) 750 MG 24 hr tablet TAKE 1 TABLET BY MOUTH ONCE DAILY WITH BREAKFAST (STOP  GLIPIZIDE) 90 tablet 0  . Microlet Lancets MISC USE 1 LANCET TO CHECK GLUCOSE ONCE DAILY    . pantoprazole (PROTONIX) 40 MG tablet Take 1 tablet by mouth once daily 90 tablet 0  . albuterol (VENTOLIN HFA) 108 (90 Base) MCG/ACT inhaler Inhale 2 puffs into the lungs every 6 (six) hours as needed for wheezing or shortness of breath. 18 g 0  . furosemide (LASIX) 20 MG tablet Take one tablet qam prn 30 tablet 5  . HYDROcodone-acetaminophen (NORCO/VICODIN) 5-325 MG tablet One tablet every six hours for pain.  Limit 7 days. 28 tablet 0  . methocarbamol (ROBAXIN) 500  MG tablet Take 1 tablet (500 mg total) by mouth 2 (two) times daily. (Patient not taking: Reported on 11/17/2019) 20 tablet 0  . naproxen (NAPROSYN) 500 MG tablet Take 1 tablet (500 mg total) by mouth 2 (two) times daily with a meal. (Patient not taking: Reported on 11/17/2019) 60 tablet 5   No current facility-administered medications for this visit.     Physical Exam  Temperature (!) 97 F (36.1 C), height '5\' 4"'$  (1.626 m), weight 248 lb (112.5 kg), last menstrual period 04/05/2016.  Constitutional: overall normal hygiene, normal nutrition, well developed, normal grooming, normal body habitus. Assistive device:none  Musculoskeletal: gait and station Limp left, muscle tone and strength are normal,  no tremors or atrophy is present.  .  Neurological: coordination overall normal.  Deep tendon reflex/nerve stretch intact.  Sensation normal.  Cranial nerves II-XII intact.   Skin:   Normal overall no scars, lesions, ulcers or rashes. No psoriasis.  Psychiatric: Alert and oriented x 3.  Recent memory intact, remote memory unclear.  Normal mood and affect. Well groomed.  Good eye contact.  Cardiovascular: overall no swelling, no varicosities, no edema bilaterally, normal temperatures of the legs and arms, no clubbing, cyanosis and good capillary refill.  Lymphatic: palpation is normal.  Left lateral hip is tender.  She has no redness. ROM is full.  Limp left.  All other systems reviewed and are negative   The patient has been educated about the nature of the problem(s) and counseled on treatment options.  The patient appeared to understand what I have discussed and is in agreement with it.  Encounter Diagnoses  Name Primary?  . Trochanteric bursitis, left hip Yes  . Acute pain of left knee   . Body mass index 40.0-44.9, adult ([place])   . Morbid obesity ([place])    PROCEDURE NOTE:  The patient request injection, verbal consent was obtained.  The left trochanteric area of the hip was  prepped appropriately after time out was performed.   Sterile technique was observed and injection of 1 cc of Depo-Medrol 40 mg with several cc's of plain xylocaine. Anesthesia was provided by ethyl chloride and a 20-gauge needle was used to inject the hip area. The injection was tolerated well.  A band aid dressing was applied.  The patient was advised to apply ice later today and tomorrow to the injection sight as needed.  PLAN Call if any problems.  Precautions discussed.  Continue current medications.   Return to clinic 3 weeks   I have reviewed the [place] web site prior to prescribing narcotic medicine for this patient.   Electronically Signed Sanjuana Kava, MD 12/29/20201:46 PM

## 2019-11-24 ENCOUNTER — Other Ambulatory Visit: Payer: Self-pay

## 2019-11-24 ENCOUNTER — Ambulatory Visit: Payer: PRIVATE HEALTH INSURANCE | Attending: Internal Medicine

## 2019-11-24 DIAGNOSIS — Z20822 Contact with and (suspected) exposure to covid-19: Secondary | ICD-10-CM

## 2019-11-25 LAB — NOVEL CORONAVIRUS, NAA: SARS-CoV-2, NAA: NOT DETECTED

## 2019-12-04 ENCOUNTER — Other Ambulatory Visit: Payer: Self-pay | Admitting: Orthopaedic Surgery

## 2019-12-04 DIAGNOSIS — M25511 Pain in right shoulder: Secondary | ICD-10-CM

## 2019-12-08 ENCOUNTER — Ambulatory Visit: Payer: PRIVATE HEALTH INSURANCE | Admitting: Orthopaedic Surgery

## 2019-12-09 ENCOUNTER — Encounter: Payer: Self-pay | Admitting: Orthopaedic Surgery

## 2019-12-09 ENCOUNTER — Ambulatory Visit (INDEPENDENT_AMBULATORY_CARE_PROVIDER_SITE_OTHER): Payer: 59 | Admitting: Orthopaedic Surgery

## 2019-12-09 ENCOUNTER — Other Ambulatory Visit: Payer: Self-pay

## 2019-12-09 DIAGNOSIS — M7062 Trochanteric bursitis, left hip: Secondary | ICD-10-CM

## 2019-12-09 MED ORDER — HYDROCODONE-ACETAMINOPHEN 5-325 MG PO TABS: ORAL_TABLET | ORAL | 0 refills | Status: DC

## 2019-12-09 NOTE — Progress Notes (Signed)
Virtual Visit via Telephone Note  I connected with@ on 12/09/19 at 10:40 AM EST by telephone and verified that I am speaking with the correct person using two identifiers.  Location: Patient: home Provider: [facility]   I discussed the limitations, risks, security and privacy concerns of performing an evaluation and management service by telephone and the availability of in person appointments. I also discussed with the patient that there may be a patient responsible charge related to this service. The patient expressed understanding and agreed to proceed.   History of Present Illness: She has had recurrence of the left hip trochanteric pain.   The injection in the office on 12-29 helped but the pain has returned.  She has no swelling, no redness, no weakness.  She thinks she needs another injection.  She is taking the Naprosyn.  Her blood sugar did not rise with the last injection.  I told her to call the office and make appointment.  I will refill her pain medicine.   Observations/Objective: Per above.  Assessment and Plan: Encounter Diagnosis  Name Primary?  . Trochanteric bursitis, left hip Yes     Follow Up Instructions: To call and make appointment.   I discussed the assessment and treatment plan with the patient. The patient was provided an opportunity to ask questions and all were answered. The patient agreed with the plan and demonstrated an understanding of the instructions.   The patient was advised to call back or seek an in-person evaluation if the symptoms worsen or if the condition fails to improve as anticipated.  I provided 10 minutes of non-face-to-face time during this encounter.   Sanjuana Kava, MD

## 2019-12-15 ENCOUNTER — Other Ambulatory Visit: Payer: Self-pay

## 2019-12-15 ENCOUNTER — Ambulatory Visit (INDEPENDENT_AMBULATORY_CARE_PROVIDER_SITE_OTHER): Payer: 59 | Admitting: Orthopaedic Surgery

## 2019-12-15 ENCOUNTER — Encounter: Payer: Self-pay | Admitting: Orthopaedic Surgery

## 2019-12-15 DIAGNOSIS — M7062 Trochanteric bursitis, left hip: Secondary | ICD-10-CM

## 2019-12-15 DIAGNOSIS — Z6841 Body Mass Index (BMI) 40.0 and over, adult: Secondary | ICD-10-CM

## 2019-12-15 MED ORDER — HYDROCODONE-ACETAMINOPHEN 5-325 MG PO TABS: ORAL_TABLET | ORAL | 0 refills | Status: DC

## 2019-12-15 NOTE — Progress Notes (Signed)
PROCEDURE NOTE:  The patient request injection, verbal consent was obtained.  The left trochanteric area of the hip was prepped appropriately after time out was performed.   Sterile technique was observed and injection of 1 cc of Depo-Medrol 40 mg with several cc's of plain xylocaine. Anesthesia was provided by ethyl chloride and a 20-gauge needle was used to inject the hip area. The injection was tolerated well.  A band aid dressing was applied.  The patient was advised to apply ice later today and tomorrow to the injection sight as needed.  I have reviewed the [place] web site prior to prescribing narcotic medicine for this patient.  I will see her in one month.  Call if any problem.  Precautions discussed.    Electronically Signed Sanjuana Kava, MD 1/26/20212:49 PM

## 2019-12-20 ENCOUNTER — Other Ambulatory Visit: Payer: Self-pay | Admitting: Nurse Practitioner

## 2019-12-21 NOTE — Telephone Encounter (Signed)
Ok times one needs six mo ck u with me or carolyn within next mo last one aug

## 2019-12-22 ENCOUNTER — Ambulatory Visit (INDEPENDENT_AMBULATORY_CARE_PROVIDER_SITE_OTHER): Payer: 59 | Admitting: Gastroenterology

## 2019-12-22 ENCOUNTER — Other Ambulatory Visit: Payer: Self-pay

## 2019-12-22 ENCOUNTER — Encounter: Payer: Self-pay | Admitting: Gastroenterology

## 2019-12-22 DIAGNOSIS — R1310 Dysphagia, unspecified: Secondary | ICD-10-CM | POA: Diagnosis not present

## 2019-12-22 DIAGNOSIS — R109 Unspecified abdominal pain: Secondary | ICD-10-CM | POA: Diagnosis not present

## 2019-12-22 DIAGNOSIS — R1319 Other dysphagia: Secondary | ICD-10-CM

## 2019-12-22 NOTE — H&P (View-Only) (Signed)
Primary Care Physician:  Mikey Kirschner, MD  Primary GI: Dr. Gala Romney   Patient Location: Home   Provider Location: [facility] office   Reason for Visit: Abdominal pain, self-referral    Persons present on the virtual encounter, with roles: Patient and NP   Total time (minutes) spent on medical discussion: 15 minutes   Due to COVID-19, visit was conducted using virtual method.  Visit was requested by patient.  Virtual Visit via DOXY Note Due to COVID-19, visit is conducted virtually and was requested by patient.   I connected with [name] on 12/22/19 at  2:30 PM EST by telephone and verified that I am speaking with the correct person using two identifiers.   I discussed the limitations, risks, security and privacy concerns of performing an evaluation and management service by telephone and the availability of in person appointments. I also discussed with the patient that there may be a patient responsible charge related to this service. The patient expressed understanding and agreed to proceed.  Chief Complaint  Patient presents with  . Abdominal Pain    diverticulitis,nausea,diarrhea occasionally     History of Present Illness: [name] is a 54 y.o. female presenting today as a self-referral due to abdominal pain. She has a history of gastritis, empiric dilation in 2013, diverticulosis, and internal hemorrhoids s/p banding in 2018.   Has noted several weeks of abdominal pain located in mid abdomen. She is unable to describe specific location. Visit limited by DOXY and unable to physically examine patient in person.   For past few weeks has had issues. Worsening. Feels like a stabbing pain. Tries to drink water to help it and ease it some. Avoiding spicy foods, sauce, as this causes discomfort. Horrible time with orange juice.  Feels like pain is steady. Eating can worsen stomach. States pain in abdomen, felt sick on stomach last few days. Pain located in the middle.  Has been cutting back on seeds. Feels like it is getting worse. Has had a lot of nausea, no vomiting. No fever or chills. No NSAIDs. Protonix once daily on empty stomach. No melena or hematochezia. Some solid food dysphagia intermittently. Noted improvement historically with dilation.   Diarrhea occasionally. Comes and goes. Feels food-related. Chronic and at baseline.   Past Medical History:  Diagnosis Date  . Anxiety   . Blood transfusion without reported diagnosis 1999   due to heavy menses  . Breast cancer ([place]) 05/22/2011   03/01/11, Stage 2, s/p lumpectomy, chemo/xrt  . DDD (degenerative disc disease), lumbar   . Depression 06/22/2011  . Diverticulitis   . DM (diabetes mellitus) ([place]) 06/22/2011  . Genital warts   . GERD (gastroesophageal reflux disease) 06/22/2011  . Hx of adenomatous colonic polyps 10/2007   50m sigmoid tubular adenoma, FH colon cancer, mother in mid-50s  . Hypercholesterolemia 06/22/2011  . Hypertension 06/22/2011  . 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[place] 05/22/2011  . Iron deficiency anemia 03/25/2015  . Morbid obesity ([place]   . Shortness of breath   . 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 Vaginal bleeding 03/08/2014     Past Surgical History:  Procedure Laterality Date  . back surg x2    . BREAST LUMPECTOMY Right 2012  . CHOLECYSTECTOMY    . COLONOSCOPY  11/03/07   4-mm sessile polyp removed/small internal hemorrhoids/tubular adenoma, random colon bx negative for microscopic colitis  . COLONOSCOPY WITH PROPOFOL N/A  09/17/2016   Grade 2 hemorrhoids, colonic diverticulosis, ascending colon polyp (sessile serrated adenoma), 5 year surveillance  . COLONOSCOPY, ESOPHAGOGASTRODUODENOSCOPY (EGD) AND ESOPHAGEAL DILATION  01/2012   mild gastritis s/p biopsy. Empiric dilation. Normal duodenum.   Marland Kitchen DILATATION & CURETTAGE/HYSTEROSCOPY WITH MYOSURE N/A 02/10/2015   Procedure: DILATATION & CURETTAGE/HYSTEROSCOPY WITH MYOSURE;   Surgeon: Nunzio Cobbs, MD;  Location: [facility];  Service: Gynecology;  Laterality: N/A;  . MM BREAST STEREO BX*L*R/S     rt.  Marland Kitchen POLYPECTOMY  09/17/2016   Procedure: POLYPECTOMY;  Surgeon: Daneil Dolin, MD;  [facility];  Service: Endoscopy;;  ascending colon  . PORT-A-CATH REMOVAL  05/26/2012   Procedure: REMOVAL PORT-A-CATH;  Surgeon: Jamesetta So, MD;  Location: [facility];  Service: General;  Laterality: N/A;  Minor Room  . PORTACATH PLACEMENT       Current Meds  Medication Sig  . atorvastatin (LIPITOR) 40 MG tablet Take 1 tablet by mouth once daily  . blood glucose meter kit and supplies KIT Dispense based on patient and insurance preference. TEST ONCE DAILY. DIABETES TYPE 2 ICD 10 CODE E11.9  . citalopram (CELEXA) 40 MG tablet Take 1 tablet by mouth once daily  . Dulaglutide (TRULICITY) 4.12 IN/8.6VE SOPN INJECT 1 SYRINGLE SUBCUTANEOUSLY ONCE A WEEK  . HYDROcodone-acetaminophen (NORCO/VICODIN) 5-325 MG tablet One tablet by mouth every six hours as needed for pain.  Seven day limit  . lisinopril (ZESTRIL) 10 MG tablet Take 1 tablet by mouth once daily  . metFORMIN (GLUCOPHAGE-XR) 750 MG 24 hr tablet Take 1 tablet by mouth once daily with breakfast  . Microlet Lancets MISC USE 1 LANCET TO CHECK GLUCOSE ONCE DAILY  . pantoprazole (PROTONIX) 40 MG tablet Take 1 tablet by mouth once daily     Family History  Problem Relation Age of Onset  . Colon cancer Mother        mid-50s, died of metastatic disease  . Cancer Mother        liver  . Coronary artery disease Mother   . Diabetes type I Mother   . Peripheral vascular disease Mother        Carotid disease in her 65s  . Coronary artery disease Father        CAD in his 37s  . Diabetes Father   . Diabetes type I Father   . Hypertension Father   . Bipolar disorder Sister   . Coronary artery disease Brother        MI in his 72s  . Hypertension Brother   . Hypertension Maternal Grandmother   . Hyperlipidemia  Maternal Grandmother   . Stroke Maternal Grandmother   . Hypertension Maternal Grandfather   . Diabetes Maternal Grandfather   . Diabetes Paternal Grandmother   . Heart disease Paternal Grandfather     Social History   Socioeconomic History  . Marital status: Married    Spouse name: Not on file  . Number of children: 3  . Years of education: Not on file  . Highest education level: Not on file  Occupational History  . Occupation: child care    Employer: [employer]  Tobacco Use  . Smoking status: Never Smoker  . Smokeless tobacco: Never Used  Substance and Sexual Activity  . Alcohol use: No    Alcohol/week: 0.0 standard drinks  . Drug use: No  . Sexual activity: Yes    Partners: Male    Birth control/protection: None  Other Topics Concern  .  Not on file  Social History Narrative  . Not on file   Social Determinants of Health   Financial Resource Strain:   . Difficulty of Paying Living Expenses: Not on file  Food Insecurity:   . Worried About Charity fundraiser in the Last Year: Not on file  . Ran Out of Food in the Last Year: Not on file  Transportation Needs:   . Lack of Transportation (Medical): Not on file  . Lack of Transportation (Non-Medical): Not on file  Physical Activity:   . Days of Exercise per Week: Not on file  . Minutes of Exercise per Session: Not on file  Stress:   . Feeling of Stress : Not on file  Social Connections:   . Frequency of Communication with Friends and Family: Not on file  . Frequency of Social Gatherings with Friends and Family: Not on file  . Attends Religious Services: Not on file  . Active Member of Clubs or Organizations: Not on file  . Attends Archivist Meetings: Not on file  . Marital Status: Not on file       Review of Systems: Gen: Denies fever, chills, anorexia. Denies fatigue, weakness, weight loss.  CV: Denies chest pain, palpitations, syncope, peripheral edema, and claudication. Resp: Denies  dyspnea at rest, cough, wheezing, coughing up blood, and pleurisy. GI: see HPI Derm: Denies rash, itching, dry skin Psych: Denies depression, anxiety, memory loss, confusion. No homicidal or suicidal ideation.  Heme: Denies bruising, bleeding, and enlarged lymph nodes.  Observations/Objective: No distress. Maintains good eye contact. No obvious discomfort.   Assessment:  54 year old female presenting as self-referral due to several week history of abdominal pain, associated nausea, and intermittent dysphagia. Visit completed via DOXY, so physical exam unable to be completed. Denying NSAIDs and currently taking PPI daily. Differentials including gastritis, PUD, unable to exclude pancreatitis but less likely. Gallbladder absent. Clinically does not seem consistent with diverticulitis.  Will pursue CBC, CMP, lipase now. May need imaging. We will also arrange EGD with dilation due to solid food dysphagia. She has also noted improvement with dilation historically, with last EGD in 2013.   Plan:  1. Proceed with upper endoscopy/dilation in the near future with Dr. Gala Romney. The risks, benefits, and alternatives have been discussed in detail with patient. They have stated understanding and desire to proceed. PROPOFOL due to polypharmacy.   2. Increase Protonix to BID  3. CBC, CMP, lipase  4. May need imaging. Will review labs first.    Follow Up Instructions:    I discussed the assessment and treatment plan with the patient. The patient was provided an opportunity to ask questions and all were answered. The patient agreed with the plan and demonstrated an understanding of the instructions.   The patient was advised to call back or seek an in-person evaluation if the symptoms worsen or if the condition fails to improve as anticipated.  I provided 15 minutes of face-to-face time via video during this encounter.  Annitta Needs, PhD, ANP-BC [facility]

## 2019-12-22 NOTE — Patient Instructions (Signed)
Increase Protonix to twice a day for now, 30 minutes before breakfast and dinner.  I have ordered blood work to have done as soon as you can. We might need to pursue imaging studies.  We are arranging an upper endoscopy with dilation in the near future.  Further recommendations to follow!  It was a pleasure to see you today. I want to create trusting relationships with patients to provide genuine, compassionate, and quality care. I value your feedback. If you receive a survey regarding your visit,  I greatly appreciate you taking time to fill this out.   Annitta Needs, PhD, ANP-BC [facility]

## 2019-12-22 NOTE — Progress Notes (Signed)
  Primary Care Physician:  Luking, William S, MD  Primary GI: Dr. Rourk   Patient Location: Home   Provider Location: [facility] office   Reason for Visit: Abdominal pain, self-referral    Persons present on the virtual encounter, with roles: Patient and NP   Total time (minutes) spent on medical discussion: 15 minutes   Due to COVID-19, visit was conducted using virtual method.  Visit was requested by patient.  Virtual Visit via DOXY Note Due to COVID-19, visit is conducted virtually and was requested by patient.   I connected with [name] on 12/22/19 at  2:30 PM EST by telephone and verified that I am speaking with the correct person using two identifiers.   I discussed the limitations, risks, security and privacy concerns of performing an evaluation and management service by telephone and the availability of in person appointments. I also discussed with the patient that there may be a patient responsible charge related to this service. The patient expressed understanding and agreed to proceed.  Chief Complaint  Patient presents with  . Abdominal Pain    diverticulitis,nausea,diarrhea occasionally     History of Present Illness: [name] is a 53 y.o. female presenting today as a self-referral due to abdominal pain. She has a history of gastritis, empiric dilation in 2013, diverticulosis, and internal hemorrhoids s/p banding in 2018.   Has noted several weeks of abdominal pain located in mid abdomen. She is unable to describe specific location. Visit limited by DOXY and unable to physically examine patient in person.   For past few weeks has had issues. Worsening. Feels like a stabbing pain. Tries to drink water to help it and ease it some. Avoiding spicy foods, sauce, as this causes discomfort. Horrible time with orange juice.  Feels like pain is steady. Eating can worsen stomach. States pain in abdomen, felt sick on stomach last few days. Pain located in the middle.  Has been cutting back on seeds. Feels like it is getting worse. Has had a lot of nausea, no vomiting. No fever or chills. No NSAIDs. Protonix once daily on empty stomach. No melena or hematochezia. Some solid food dysphagia intermittently. Noted improvement historically with dilation.   Diarrhea occasionally. Comes and goes. Feels food-related. Chronic and at baseline.   Past Medical History:  Diagnosis Date  . Anxiety   . Blood transfusion without reported diagnosis 1999   due to heavy menses  . Breast cancer (HCC) 05/22/2011   03/01/11, Stage 2, s/p lumpectomy, chemo/xrt  . DDD (degenerative disc disease), lumbar   . Depression 06/22/2011  . Diverticulitis   . DM (diabetes mellitus) (HCC) 06/22/2011  . Genital warts   . GERD (gastroesophageal reflux disease) 06/22/2011  . Hx of adenomatous colonic polyps 10/2007   4mm sigmoid tubular adenoma, FH colon cancer, mother in mid-50s  . Hypercholesterolemia 06/22/2011  . Hypertension 06/22/2011  . 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 Iron deficiency anemia 03/25/2015  . Morbid obesity (HCC)   . Shortness of breath   . 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 Vaginal bleeding 03/08/2014     Past Surgical History:  Procedure Laterality Date  . back surg x2    . BREAST LUMPECTOMY Right 2012  . CHOLECYSTECTOMY    . COLONOSCOPY  11/03/07   4-mm sessile polyp removed/small internal hemorrhoids/tubular adenoma, random colon bx negative for microscopic colitis  . COLONOSCOPY WITH PROPOFOL N/A   09/17/2016   Grade 2 hemorrhoids, colonic diverticulosis, ascending colon polyp (sessile serrated adenoma), 5 year surveillance  . COLONOSCOPY, ESOPHAGOGASTRODUODENOSCOPY (EGD) AND ESOPHAGEAL DILATION  01/2012   mild gastritis s/p biopsy. Empiric dilation. Normal duodenum.   . DILATATION & CURETTAGE/HYSTEROSCOPY WITH MYOSURE N/A 02/10/2015   Procedure: DILATATION & CURETTAGE/HYSTEROSCOPY WITH MYOSURE;   Surgeon: Brook E Amundson C Silva, MD;  Location: [facility];  Service: Gynecology;  Laterality: N/A;  . MM BREAST STEREO BX*L*R/S     rt.  . POLYPECTOMY  09/17/2016   Procedure: POLYPECTOMY;  Surgeon: Robert M Rourk, MD;  Location: [facility];  Service: Endoscopy;;  ascending colon  . PORT-A-CATH REMOVAL  05/26/2012   Procedure: REMOVAL PORT-A-CATH;  Surgeon: Mark A Jenkins, MD;  Location: [facility];  Service: General;  Laterality: N/A;  Minor Room  . PORTACATH PLACEMENT       Current Meds  Medication Sig  . atorvastatin (LIPITOR) 40 MG tablet Take 1 tablet by mouth once daily  . blood glucose meter kit and supplies KIT Dispense based on patient and insurance preference. TEST ONCE DAILY. DIABETES TYPE 2 ICD 10 CODE E11.9  . citalopram (CELEXA) 40 MG tablet Take 1 tablet by mouth once daily  . Dulaglutide (TRULICITY) 0.75 MG/0.5ML SOPN INJECT 1 SYRINGLE SUBCUTANEOUSLY ONCE A WEEK  . HYDROcodone-acetaminophen (NORCO/VICODIN) 5-325 MG tablet One tablet by mouth every six hours as needed for pain.  Seven day limit  . lisinopril (ZESTRIL) 10 MG tablet Take 1 tablet by mouth once daily  . metFORMIN (GLUCOPHAGE-XR) 750 MG 24 hr tablet Take 1 tablet by mouth once daily with breakfast  . Microlet Lancets MISC USE 1 LANCET TO CHECK GLUCOSE ONCE DAILY  . pantoprazole (PROTONIX) 40 MG tablet Take 1 tablet by mouth once daily     Family History  Problem Relation Age of Onset  . Colon cancer Mother        mid-50s, died of metastatic disease  . Cancer Mother        liver  . Coronary artery disease Mother   . Diabetes type I Mother   . Peripheral vascular disease Mother        Carotid disease in her 50s  . Coronary artery disease Father        CAD in his 60s  . Diabetes Father   . Diabetes type I Father   . Hypertension Father   . Bipolar disorder Sister   . Coronary artery disease Brother        MI in his 30s  . Hypertension Brother   . Hypertension Maternal Grandmother   . Hyperlipidemia  Maternal Grandmother   . Stroke Maternal Grandmother   . Hypertension Maternal Grandfather   . Diabetes Maternal Grandfather   . Diabetes Paternal Grandmother   . Heart disease Paternal Grandfather     Social History   Socioeconomic History  . Marital status: Married    Spouse name: Not on file  . Number of children: 3  . Years of education: Not on file  . Highest education level: Not on file  Occupational History  . Occupation: child care    Employer: [employer]  Tobacco Use  . Smoking status: Never Smoker  . Smokeless tobacco: Never Used  Substance and Sexual Activity  . Alcohol use: No    Alcohol/week: 0.0 standard drinks  . Drug use: No  . Sexual activity: Yes    Partners: Male    Birth control/protection: None  Other Topics Concern  .   Not on file  Social History Narrative  . Not on file   Social Determinants of Health   Financial Resource Strain:   . Difficulty of Paying Living Expenses: Not on file  Food Insecurity:   . Worried About Charity fundraiser in the Last Year: Not on file  . Ran Out of Food in the Last Year: Not on file  Transportation Needs:   . Lack of Transportation (Medical): Not on file  . Lack of Transportation (Non-Medical): Not on file  Physical Activity:   . Days of Exercise per Week: Not on file  . Minutes of Exercise per Session: Not on file  Stress:   . Feeling of Stress : Not on file  Social Connections:   . Frequency of Communication with Friends and Family: Not on file  . Frequency of Social Gatherings with Friends and Family: Not on file  . Attends Religious Services: Not on file  . Active Member of Clubs or Organizations: Not on file  . Attends Archivist Meetings: Not on file  . Marital Status: Not on file       Review of Systems: Gen: Denies fever, chills, anorexia. Denies fatigue, weakness, weight loss.  CV: Denies chest pain, palpitations, syncope, peripheral edema, and claudication. Resp: Denies  dyspnea at rest, cough, wheezing, coughing up blood, and pleurisy. GI: see HPI Derm: Denies rash, itching, dry skin Psych: Denies depression, anxiety, memory loss, confusion. No homicidal or suicidal ideation.  Heme: Denies bruising, bleeding, and enlarged lymph nodes.  Observations/Objective: No distress. Maintains good eye contact. No obvious discomfort.   Assessment:  54 year old female presenting as self-referral due to several week history of abdominal pain, associated nausea, and intermittent dysphagia. Visit completed via DOXY, so physical exam unable to be completed. Denying NSAIDs and currently taking PPI daily. Differentials including gastritis, PUD, unable to exclude pancreatitis but less likely. Gallbladder absent. Clinically does not seem consistent with diverticulitis.  Will pursue CBC, CMP, lipase now. May need imaging. We will also arrange EGD with dilation due to solid food dysphagia. She has also noted improvement with dilation historically, with last EGD in 2013.   Plan:  1. Proceed with upper endoscopy/dilation in the near future with Dr. Gala Romney. The risks, benefits, and alternatives have been discussed in detail with patient. They have stated understanding and desire to proceed. PROPOFOL due to polypharmacy.   2. Increase Protonix to BID  3. CBC, CMP, lipase  4. May need imaging. Will review labs first.    Follow Up Instructions:    I discussed the assessment and treatment plan with the patient. The patient was provided an opportunity to ask questions and all were answered. The patient agreed with the plan and demonstrated an understanding of the instructions.   The patient was advised to call back or seek an in-person evaluation if the symptoms worsen or if the condition fails to improve as anticipated.  I provided 15 minutes of face-to-face time via video during this encounter.  Annitta Needs, PhD, ANP-BC [facility]

## 2019-12-23 ENCOUNTER — Encounter: Payer: Self-pay | Admitting: Family Medicine

## 2019-12-23 ENCOUNTER — Telehealth: Payer: Self-pay | Admitting: Gastroenterology

## 2019-12-23 ENCOUNTER — Telehealth: Payer: Self-pay

## 2019-12-23 NOTE — Telephone Encounter (Signed)
Called pt, EGD/DIL w/Prop w/RMR scheduled for 02/22/20 at 10:30am. Orders entered.

## 2019-12-23 NOTE — Telephone Encounter (Signed)
error 

## 2019-12-23 NOTE — Progress Notes (Signed)
Cc'ed to pcp °

## 2019-12-23 NOTE — Telephone Encounter (Signed)
Pre-op and COVID test 02/18/20. Letter mailed with procedure instructions.

## 2019-12-24 ENCOUNTER — Telehealth: Payer: Self-pay

## 2019-12-24 LAB — CBC WITH DIFFERENTIAL
Basophils Absolute: 0.1 10*3/uL (ref 0.0–0.2)
Basos: 1 %
EOS (ABSOLUTE): 0.2 10*3/uL (ref 0.0–0.4)
Eos: 2 %
Hematocrit: 41 % (ref 34.0–46.6)
Hemoglobin: 13.3 g/dL (ref 11.1–15.9)
Immature Grans (Abs): 0 10*3/uL (ref 0.0–0.1)
Immature Granulocytes: 0 %
Lymphocytes Absolute: 2.6 10*3/uL (ref 0.7–3.1)
Lymphs: 35 %
MCH: 28.1 pg (ref 26.6–33.0)
MCHC: 32.4 g/dL (ref 31.5–35.7)
MCV: 87 fL (ref 79–97)
Monocytes Absolute: 0.5 10*3/uL (ref 0.1–0.9)
Monocytes: 7 %
Neutrophils Absolute: 4 10*3/uL (ref 1.4–7.0)
Neutrophils: 55 %
RBC: 4.74 x10E6/uL (ref 3.77–5.28)
RDW: 14.7 % (ref 11.7–15.4)
WBC: 7.3 10*3/uL (ref 3.4–10.8)

## 2019-12-24 LAB — COMPREHENSIVE METABOLIC PANEL
ALT: 16 IU/L (ref 0–32)
AST: 17 IU/L (ref 0–40)
Albumin/Globulin Ratio: 1.4 (ref 1.2–2.2)
Albumin: 4.2 g/dL (ref 3.8–4.9)
Alkaline Phosphatase: 70 IU/L (ref 39–117)
BUN/Creatinine Ratio: 13 (ref 9–23)
BUN: 12 mg/dL (ref 6–24)
Bilirubin Total: 0.3 mg/dL (ref 0.0–1.2)
CO2: 24 mmol/L (ref 20–29)
Calcium: 9.3 mg/dL (ref 8.7–10.2)
Chloride: 103 mmol/L (ref 96–106)
Creatinine, Ser: 0.96 mg/dL (ref 0.57–1.00)
GFR calc Af Amer: 78 mL/min/{1.73_m2} (ref >59)
GFR calc non Af Amer: 67 mL/min/{1.73_m2} (ref >59)
Globulin, Total: 3 g/dL (ref 1.5–4.5)
Glucose: 95 mg/dL (ref 65–99)
Potassium: 4.1 mmol/L (ref 3.5–5.2)
Sodium: 144 mmol/L (ref 134–144)
Total Protein: 7.2 g/dL (ref 6.0–8.5)

## 2019-12-24 LAB — LIPASE: Lipase: 34 U/L (ref 14–72)

## 2019-12-24 NOTE — Telephone Encounter (Signed)
PA for EGD submitted via Availtiy website for [facility]. Case pended. Reference# [id number].

## 2019-12-28 ENCOUNTER — Other Ambulatory Visit: Payer: Self-pay

## 2019-12-28 NOTE — Telephone Encounter (Signed)
Received fax from [facility]. EGD approved. Request case#: [id number], valid 12/25/19-03/24/20.

## 2019-12-29 ENCOUNTER — Other Ambulatory Visit: Payer: Self-pay | Admitting: Gastroenterology

## 2019-12-29 MED ORDER — ONDANSETRON HCL 4 MG PO TABS: 4.0000 mg | ORAL_TABLET | Freq: Three times a day (TID) | ORAL | 1 refills | Status: DC | PRN

## 2019-12-31 ENCOUNTER — Telehealth: Payer: Self-pay | Admitting: Orthopaedic Surgery

## 2019-12-31 MED ORDER — HYDROCODONE-ACETAMINOPHEN 5-325 MG PO TABS: ORAL_TABLET | ORAL | 0 refills | Status: DC

## 2019-12-31 NOTE — Telephone Encounter (Signed)
Patient called for refill: HYDROcodone-acetaminophen (NORCO/VICODIN) 5-325 MG tablet 28 tab  -[place]

## 2020-01-05 ENCOUNTER — Other Ambulatory Visit: Payer: Self-pay | Admitting: *Deleted

## 2020-01-05 ENCOUNTER — Telehealth: Payer: Self-pay | Admitting: Family Medicine

## 2020-01-05 MED ORDER — BLOOD GLUCOSE METER KIT: PACK | 5 refills | Status: DC

## 2020-01-05 NOTE — Telephone Encounter (Signed)
Pt needs a one touch meter called in with supplies and lancets to [facility] in [place]. The one prescribed insurance will not cover.

## 2020-01-05 NOTE — Telephone Encounter (Signed)
Script faxed to pharm and pt notified.

## 2020-01-06 ENCOUNTER — Encounter (HOSPITAL_COMMUNITY): Payer: Self-pay

## 2020-01-06 ENCOUNTER — Other Ambulatory Visit: Payer: Self-pay

## 2020-01-07 ENCOUNTER — Encounter (HOSPITAL_COMMUNITY)
Admission: RE | Admit: 2020-01-07 | Discharge: 2020-01-07 | Disposition: A | Discharge status: Home / Self Care | Payer: 59 | Source: Ambulatory Visit | Attending: Internal Medicine | Admitting: Internal Medicine

## 2020-01-07 ENCOUNTER — Other Ambulatory Visit (HOSPITAL_COMMUNITY): Admission: RE | Admit: 2020-01-07 | Payer: 59 | Source: Ambulatory Visit

## 2020-01-08 ENCOUNTER — Other Ambulatory Visit (HOSPITAL_COMMUNITY)
Admission: RE | Admit: 2020-01-08 | Discharge: 2020-01-08 | Disposition: A | Discharge status: Home / Self Care | Payer: 59 | Source: Ambulatory Visit | Attending: Internal Medicine | Admitting: Internal Medicine

## 2020-01-08 ENCOUNTER — Encounter (HOSPITAL_COMMUNITY)
Admission: RE | Admit: 2020-01-08 | Discharge: 2020-01-08 | Disposition: A | Discharge status: Home / Self Care | Payer: 59 | Source: Ambulatory Visit | Attending: Internal Medicine | Admitting: Internal Medicine

## 2020-01-08 ENCOUNTER — Other Ambulatory Visit: Payer: Self-pay

## 2020-01-08 DIAGNOSIS — Z01812 Encounter for preprocedural laboratory examination: Secondary | ICD-10-CM | POA: Insufficient documentation

## 2020-01-08 DIAGNOSIS — Z20822 Contact with and (suspected) exposure to covid-19: Secondary | ICD-10-CM | POA: Diagnosis not present

## 2020-01-08 LAB — SARS CORONAVIRUS 2 (TAT 6-24 HRS): SARS Coronavirus 2: NEGATIVE

## 2020-01-11 ENCOUNTER — Ambulatory Visit (HOSPITAL_COMMUNITY)
Admission: RE | Admit: 2020-01-11 | Discharge: 2020-01-11 | Disposition: A | Discharge status: Home / Self Care | Payer: 59 | Attending: Internal Medicine | Admitting: Internal Medicine

## 2020-01-11 ENCOUNTER — Ambulatory Visit (HOSPITAL_COMMUNITY): Payer: 59 | Admitting: Anesthesiology

## 2020-01-11 ENCOUNTER — Encounter (HOSPITAL_COMMUNITY)
Admission: RE | Disposition: A | Discharge status: Home / Self Care | Payer: Self-pay | Source: Home / Self Care | Attending: Internal Medicine

## 2020-01-11 ENCOUNTER — Encounter (HOSPITAL_COMMUNITY): Payer: Self-pay | Admitting: Internal Medicine

## 2020-01-11 DIAGNOSIS — F329 Major depressive disorder, single episode, unspecified: Secondary | ICD-10-CM | POA: Insufficient documentation

## 2020-01-11 DIAGNOSIS — Z6841 Body Mass Index (BMI) 40.0 and over, adult: Secondary | ICD-10-CM | POA: Diagnosis not present

## 2020-01-11 DIAGNOSIS — R1310 Dysphagia, unspecified: Secondary | ICD-10-CM | POA: Diagnosis not present

## 2020-01-11 DIAGNOSIS — E119 Type 2 diabetes mellitus without complications: Secondary | ICD-10-CM | POA: Insufficient documentation

## 2020-01-11 DIAGNOSIS — R109 Unspecified abdominal pain: Secondary | ICD-10-CM | POA: Insufficient documentation

## 2020-01-11 DIAGNOSIS — Z853 Personal history of malignant neoplasm of breast: Secondary | ICD-10-CM | POA: Insufficient documentation

## 2020-01-11 DIAGNOSIS — Z9221 Personal history of antineoplastic chemotherapy: Secondary | ICD-10-CM | POA: Diagnosis not present

## 2020-01-11 DIAGNOSIS — Z923 Personal history of irradiation: Secondary | ICD-10-CM | POA: Insufficient documentation

## 2020-01-11 DIAGNOSIS — K219 Gastro-esophageal reflux disease without esophagitis: Secondary | ICD-10-CM | POA: Insufficient documentation

## 2020-01-11 DIAGNOSIS — Z7984 Long term (current) use of oral hypoglycemic drugs: Secondary | ICD-10-CM | POA: Insufficient documentation

## 2020-01-11 DIAGNOSIS — Z79899 Other long term (current) drug therapy: Secondary | ICD-10-CM | POA: Insufficient documentation

## 2020-01-11 DIAGNOSIS — Z86010 Personal history of colonic polyps: Secondary | ICD-10-CM | POA: Insufficient documentation

## 2020-01-11 DIAGNOSIS — I10 Essential (primary) hypertension: Secondary | ICD-10-CM | POA: Diagnosis not present

## 2020-01-11 DIAGNOSIS — E7800 Pure hypercholesterolemia, unspecified: Secondary | ICD-10-CM | POA: Insufficient documentation

## 2020-01-11 HISTORY — PX: MALONEY DILATION: SHX5535

## 2020-01-11 HISTORY — PX: ESOPHAGOGASTRODUODENOSCOPY (EGD) WITH PROPOFOL: SHX5813

## 2020-01-11 LAB — GLUCOSE, CAPILLARY: Glucose-Capillary: 103 mg/dL — ABNORMAL HIGH (ref 70–99)

## 2020-01-11 SURGERY — ESOPHAGOGASTRODUODENOSCOPY (EGD) WITH PROPOFOL
Anesthesia: General

## 2020-01-11 MED ORDER — LACTATED RINGERS IV SOLN
INTRAVENOUS | Status: DC | PRN
  Administered 2020-01-11: 1000 mL via INTRAVENOUS

## 2020-01-11 MED ORDER — CHLORHEXIDINE GLUCONATE CLOTH 2 % EX PADS: 6.0000 | MEDICATED_PAD | Freq: Once | CUTANEOUS | Status: DC

## 2020-01-11 MED ORDER — PROPOFOL 10 MG/ML IV BOLUS
INTRAVENOUS | Status: AC
  Filled 2020-01-11: qty 40

## 2020-01-11 MED ORDER — KETAMINE HCL 10 MG/ML IJ SOLN
INTRAMUSCULAR | Status: DC | PRN
  Administered 2020-01-11: 20 mg via INTRAVENOUS

## 2020-01-11 MED ORDER — LACTATED RINGERS IV SOLN: INTRAVENOUS | Status: DC

## 2020-01-11 MED ORDER — KETAMINE HCL 50 MG/5ML IJ SOSY
PREFILLED_SYRINGE | INTRAMUSCULAR | Status: AC
  Filled 2020-01-11: qty 5

## 2020-01-11 MED ORDER — PROPOFOL 500 MG/50ML IV EMUL
INTRAVENOUS | Status: DC | PRN
  Administered 2020-01-11: 150 ug/kg/min via INTRAVENOUS

## 2020-01-11 MED ORDER — PROPOFOL 10 MG/ML IV BOLUS
INTRAVENOUS | Status: DC | PRN
  Administered 2020-01-11: 40 mg via INTRAVENOUS

## 2020-01-11 MED ORDER — STERILE WATER FOR IRRIGATION IR SOLN
Status: DC | PRN
  Administered 2020-01-11: 100 mL

## 2020-01-11 NOTE — Anesthesia Postprocedure Evaluation (Signed)
Anesthesia Post Note  Patient: [name]  Procedure(s) Performed: ESOPHAGOGASTRODUODENOSCOPY (EGD) WITH PROPOFOL (N/A ) MALONEY DILATION (N/A )  Patient location during evaluation: PACU Anesthesia Type: General Level of consciousness: awake and alert and oriented Pain management: pain level controlled Vital Signs Assessment: post-procedure vital signs reviewed and stable Respiratory status: spontaneous breathing Cardiovascular status: stable Postop Assessment: no apparent nausea or vomiting Anesthetic complications: no     Last Vitals:  Vitals:   01/11/20 0734 01/11/20 0740  BP:  131/80  Pulse: 66   Resp: 20   Temp: 36.7 C   SpO2: 97%     Last Pain:  Vitals:   01/11/20 0853  TempSrc:   PainSc: 0-No pain                 Zacharia Sowles

## 2020-01-11 NOTE — Discharge Instructions (Signed)
EGD Discharge instructions Please read the instructions outlined below and refer to this sheet in the next few weeks. These discharge instructions provide you with general information on caring for yourself after you leave the hospital. Your doctor may also give you specific instructions. While your treatment has been planned according to the most current medical practices available, unavoidable complications occasionally occur. If you have any problems or questions after discharge, please call your doctor. ACTIVITY  You may resume your regular activity but move at a slower pace for the next 24 hours.   Take frequent rest periods for the next 24 hours.   Walking will help expel (get rid of) the air and reduce the bloated feeling in your abdomen.   No driving for 24 hours (because of the anesthesia (medicine) used during the test).   You may shower.   Do not sign any important legal documents or operate any machinery for 24 hours (because of the anesthesia used during the test).  NUTRITION  Drink plenty of fluids.   You may resume your normal diet.   Begin with a light meal and progress to your normal diet.   Avoid alcoholic beverages for 24 hours or as instructed by your caregiver.  MEDICATIONS  You may resume your normal medications unless your caregiver tells you otherwise.  WHAT YOU CAN EXPECT TODAY  You may experience abdominal discomfort such as a feeling of fullness or "gas" pains.  FOLLOW-UP  Your doctor will discuss the results of your test with you.  SEEK IMMEDIATE MEDICAL ATTENTION IF ANY OF THE FOLLOWING OCCUR:  Excessive nausea (feeling sick to your stomach) and/or vomiting.   Severe abdominal pain and distention (swelling).   Trouble swallowing.   Temperature over 101 F (37.8 C).   Rectal bleeding or vomiting of blood.   I stretched your esophagus today.  Continue Protonix 40 mg twice daily  Office visit with [place] in 6 months at your request, call your  daughter ,  York Ram at [phone] -reviewed results

## 2020-01-11 NOTE — Op Note (Signed)
[facility] Patient Name: [name] Procedure Date: 01/11/2020 8:25 AM MRN: [id number] Date of Birth: 01-Aug-1966 Attending MD: Norvel Richards , MD CSN: [id number] Age: 54 Admit Type: Outpatient Procedure:                Upper GI endoscopy Indications:              Dysphagia Providers:                Norvel Richards, MD, Rosina Lowenstein, RN, Raphael Gibney, Technician Referring MD:              Medicines:                Propofol per Anesthesia Complications:            No immediate complications. Estimated Blood Loss:     Estimated blood loss: none. Estimated blood loss:                            none. Procedure:                Pre-Anesthesia Assessment:                           - Prior to the procedure, a History and Physical                            was performed, and patient medications and                            allergies were reviewed. The patient's tolerance of                            previous anesthesia was also reviewed. The risks                            and benefits of the procedure and the sedation                            options and risks were discussed with the patient.                            All questions were answered, and informed consent                            was obtained. Prior Anticoagulants: The patient has                            taken no previous anticoagulant or antiplatelet                            agents. ASA Grade Assessment: II - A patient with                            mild systemic  disease. After reviewing the risks                            and benefits, the patient was deemed in                            satisfactory condition to undergo the procedure.                           After obtaining informed consent, the endoscope was                            passed under direct vision. Throughout the                            procedure, the patient's blood pressure, pulse, and                           oxygen saturations were monitored continuously. The                            [id number]) was introduced through the                            mouth, and advanced to the second part of duodenum.                            The upper GI endoscopy was accomplished without                            difficulty. The patient tolerated the procedure                            well. Scope In: 8:46:16 AM Scope Out: 8:51:32 AM Total Procedure Duration: 0 hours 5 minutes 16 seconds  Findings:      The examined esophagus was normal.      The entire examined stomach was normal.      The duodenal bulb and second portion of the duodenum were normal. The       scope was withdrawn. Dilation was performed with a Maloney dilator with       mild resistance at 56 Fr. The dilation site was examined following       endoscope reinsertion and showed no change. Estimated blood loss: none. Impression:               - Normal esophagus. Status post dilation                           - Normal stomach.                           - Normal duodenal bulb and second portion of the                            duodenum.                           -  No specimens collected. Moderate Sedation:      Moderate (conscious) sedation was personally administered by an       anesthesia professional. The following parameters were monitored: oxygen       saturation, heart rate, blood pressure, respiratory rate, EKG, adequacy       of pulmonary ventilation, and response to care. Recommendation:           - Patient has a contact number available for                            emergencies. The signs and symptoms of potential                            delayed complications were discussed with the                            patient. Return to normal activities tomorrow.                            Written discharge instructions were provided to the                            patient.                            - Advance diet as tolerated.                           - Continue present medications. Continue Protonix                            40 mg twice daily.                           - Return to my office in 6 months. Procedure Code(s):        --- Professional ---                           [phone], Esophagogastroduodenoscopy, flexible,                            transoral; diagnostic, including collection of                            specimen(s) by brushing or washing, when performed                            (separate procedure)                           43450, Dilation of esophagus, by unguided sound or                            bougie, single or multiple passes Diagnosis Code(s):        --- Professional ---  R13.10, Dysphagia, unspecified CPT copyright 2019 American Medical Association. All rights reserved. The codes documented in this report are preliminary and upon coder review may  be revised to meet current compliance requirements. Cristopher Estimable. Arnetra Terris, MD Norvel Richards, MD 01/11/2020 9:00:08 AM This report has been signed electronically. Number of Addenda: 0

## 2020-01-11 NOTE — Interval H&P Note (Signed)
History and Physical Interval Note:  01/11/2020 8:31 AM  [name]  has presented today for surgery, with the diagnosis of dyspepsia, dysphagia.  The various methods of treatment have been discussed with the patient and family. After consideration of risks, benefits and other options for treatment, the patient has consented to  Procedure(s) with comments: ESOPHAGOGASTRODUODENOSCOPY (EGD) WITH PROPOFOL (N/A) - 10:30am-office rescheduled 2/22 @ 8:30am MALONEY DILATION (N/A) as a surgical intervention.  The patient's history has been reviewed, patient examined, no change in status, stable for surgery.  I have reviewed the patient's chart and labs.  Questions were answered to the patient's satisfaction.     [name]   No change.  Reflux better on Protonix 40 mg twice daily.  EGD with dilation today as feasible/appropriate per plan. The risks, benefits, limitations, alternatives and imponderables have been reviewed with the patient. Potential for esophageal dilation, biopsy, etc. have also been reviewed.  Questions have been answered. All parties agreeable.

## 2020-01-11 NOTE — Addendum Note (Signed)
Addendum  created 01/11/20 0944 by Ollen Bowl, CRNA   Charge Capture section accepted

## 2020-01-11 NOTE — Transfer of Care (Signed)
Immediate Anesthesia Transfer of Care Note  Patient: [name]  Procedure(s) Performed: ESOPHAGOGASTRODUODENOSCOPY (EGD) WITH PROPOFOL (N/A ) MALONEY DILATION (N/A )  Patient Location: PACU  Anesthesia Type:General  Level of Consciousness: awake  Airway & Oxygen Therapy: Patient Spontanous Breathing  Post-op Assessment: Report given to RN  Post vital signs: Reviewed  Last Vitals:  Vitals Value Taken Time  BP    Temp    Pulse    Resp    SpO2      Last Pain:  Vitals:   01/11/20 0853  TempSrc:   PainSc: 0-No pain      Patients Stated Pain Goal: 6 ([id number])  Complications: No apparent anesthesia complications

## 2020-01-11 NOTE — Anesthesia Preprocedure Evaluation (Signed)
Anesthesia Evaluation  Patient identified by MRN, date of birth, ID band Patient awake    Reviewed: Allergy & Precautions, NPO status , Patient's Chart, lab work & pertinent test results  Airway Mallampati: III  TM Distance: >3 FB Neck ROM: Full    Dental no notable dental hx. (+) Teeth Intact   Pulmonary shortness of breath and with exertion,    Pulmonary exam normal breath sounds clear to auscultation       Cardiovascular Exercise Tolerance: Good hypertension, Pt. on medications + DOE  Normal cardiovascular examI Rhythm:Regular Rate:Normal     Neuro/Psych Anxiety Depression negative neurological ROS  negative psych ROS   GI/Hepatic Neg liver ROS, GERD  Medicated and Controlled,  Endo/Other  diabetes, Type 2, Oral Hypoglycemic AgentsMorbid obesity  Renal/GU negative Renal ROS  negative genitourinary   Musculoskeletal  (+) Arthritis , Osteoarthritis,    Abdominal   Peds negative pediatric ROS (+)  Hematology negative hematology ROS (+)   Anesthesia Other Findings H/o L breast Ca - s/p exc/CT/RT last tx 2012  Reproductive/Obstetrics negative OB ROS                             Anesthesia Physical Anesthesia Plan  ASA: III  Anesthesia Plan: General   Post-op Pain Management:    Induction: Intravenous  PONV Risk Score and Plan: 3 and Propofol infusion, TIVA, Ondansetron and Treatment may vary due to age or medical condition  Airway Management Planned: Simple Face Mask and Nasal Cannula  Additional Equipment:   Intra-op Plan:   Post-operative Plan:   Informed Consent: I have reviewed the patients History and Physical, chart, labs and discussed the procedure including the risks, benefits and alternatives for the proposed anesthesia with the patient or authorized representative who has indicated his/her understanding and acceptance.     Dental advisory given  Plan Discussed  with: CRNA  Anesthesia Plan Comments: (Plan Full PPE use  Plan GA with GETA as needed d/w pt -WTP with same after Q&A)        Anesthesia Quick Evaluation

## 2020-01-19 ENCOUNTER — Ambulatory Visit: Payer: 59 | Admitting: Orthopaedic Surgery

## 2020-01-26 ENCOUNTER — Ambulatory Visit: Payer: 59 | Admitting: Orthopaedic Surgery

## 2020-01-26 ENCOUNTER — Telehealth: Payer: Self-pay | Admitting: Orthopaedic Surgery

## 2020-01-26 MED ORDER — HYDROCODONE-ACETAMINOPHEN 5-325 MG PO TABS: ORAL_TABLET | ORAL | 0 refills | Status: DC

## 2020-01-26 NOTE — Telephone Encounter (Signed)
Patient was scheduled to be seen today.  She called and stated she had a runny nose but thought it was her sinuses.  I spoke to [place] who suggested that the patient wait and reschedule her appointment until next week just to make sure of what was going on.  Patient agreed  Patient does request a refill on Hydrocodone/Acetaminophen 5-325  Mgs.  Qty  24      Sig: One tablet by mouth every six hours as needed for pain. Seven day limit    Patient states she uses [facility] in [place]

## 2020-01-28 ENCOUNTER — Ambulatory Visit: Payer: 59 | Attending: Internal Medicine

## 2020-01-28 DIAGNOSIS — Z23 Encounter for immunization: Secondary | ICD-10-CM

## 2020-01-28 NOTE — Progress Notes (Signed)
   Covid-19 Vaccination Clinic  Name:  [name]    MRN: [id number] DOB: 1966-04-07  01/28/2020  [name] was observed post Covid-19 immunization for 15 minutes. Brief epsiode of dizziness per pt report. VS Taken, BP 113/72, pulse 86 and 98% room air. Water provided, and pt states " I think it was just anxiety." She was provided with Vaccine Information Sheet and instruction to access the V-Safe system.   [name] was instructed to call 911 with any severe reactions post vaccine: Marland Kitchen Difficulty breathing  . Swelling of face and throat  . A fast heartbeat  . A bad rash all over body  . Dizziness and weakness   Immunizations Administered    Name Date Dose VIS Date Route   Moderna COVID-19 Vaccine 01/28/2020 11:46 AM 0.5 mL 10/20/2019 Intramuscular   Manufacturer: Moderna   Lot: [id number]   [id number]

## 2020-02-09 ENCOUNTER — Other Ambulatory Visit: Payer: Self-pay

## 2020-02-09 ENCOUNTER — Encounter: Payer: Self-pay | Admitting: Orthopaedic Surgery

## 2020-02-09 ENCOUNTER — Ambulatory Visit (INDEPENDENT_AMBULATORY_CARE_PROVIDER_SITE_OTHER): Payer: 59 | Admitting: Orthopaedic Surgery

## 2020-02-09 DIAGNOSIS — M7062 Trochanteric bursitis, left hip: Secondary | ICD-10-CM | POA: Diagnosis not present

## 2020-02-09 DIAGNOSIS — Z6841 Body Mass Index (BMI) 40.0 and over, adult: Secondary | ICD-10-CM

## 2020-02-09 MED ORDER — HYDROCODONE-ACETAMINOPHEN 5-325 MG PO TABS: ORAL_TABLET | ORAL | 0 refills | Status: DC

## 2020-02-09 NOTE — Progress Notes (Signed)
PROCEDURE NOTE:  The patient request injection, verbal consent was obtained.  The left trochanteric area of the hip was prepped appropriately after time out was performed.   Sterile technique was observed and injection of 1 cc of Depo-Medrol 40 mg with several cc's of plain xylocaine. Anesthesia was provided by ethyl chloride and a 20-gauge needle was used to inject the hip area. The injection was tolerated well.  A band aid dressing was applied.  The patient was advised to apply ice later today and tomorrow to the injection sight as needed.  I have reviewed the [place] web site prior to prescribing narcotic medicine for this patient.   Electronically Signed Sanjuana Kava, MD 3/23/20211:45 PM

## 2020-02-16 ENCOUNTER — Ambulatory Visit: Payer: 59 | Attending: Internal Medicine

## 2020-02-16 ENCOUNTER — Other Ambulatory Visit: Payer: Self-pay

## 2020-02-16 DIAGNOSIS — Z20822 Contact with and (suspected) exposure to covid-19: Secondary | ICD-10-CM

## 2020-02-17 LAB — NOVEL CORONAVIRUS, NAA: SARS-CoV-2, NAA: NOT DETECTED

## 2020-02-17 LAB — SARS-COV-2, NAA 2 DAY TAT

## 2020-02-18 ENCOUNTER — Other Ambulatory Visit (HOSPITAL_COMMUNITY): Payer: 59

## 2020-02-25 ENCOUNTER — Ambulatory Visit (INDEPENDENT_AMBULATORY_CARE_PROVIDER_SITE_OTHER): Payer: 59 | Admitting: Orthopaedic Surgery

## 2020-02-25 ENCOUNTER — Encounter: Payer: Self-pay | Admitting: Orthopaedic Surgery

## 2020-02-25 ENCOUNTER — Ambulatory Visit: Payer: 59

## 2020-02-25 ENCOUNTER — Other Ambulatory Visit: Payer: Self-pay

## 2020-02-25 VITALS — BP 122/78 | HR 80 | Temp 97.1°F | Ht 64.0 in | Wt 238.2 lb

## 2020-02-25 DIAGNOSIS — J683 Other acute and subacute respiratory conditions due to chemicals, gases, fumes and vapors: Secondary | ICD-10-CM | POA: Insufficient documentation

## 2020-02-25 DIAGNOSIS — M25551 Pain in right hip: Secondary | ICD-10-CM

## 2020-02-25 DIAGNOSIS — W19XXXA Unspecified fall, initial encounter: Secondary | ICD-10-CM

## 2020-02-25 DIAGNOSIS — F324 Major depressive disorder, single episode, in partial remission: Secondary | ICD-10-CM

## 2020-02-25 DIAGNOSIS — G620 Drug-induced polyneuropathy: Secondary | ICD-10-CM

## 2020-02-25 DIAGNOSIS — Z6841 Body Mass Index (BMI) 40.0 and over, adult: Secondary | ICD-10-CM

## 2020-02-25 DIAGNOSIS — E119 Type 2 diabetes mellitus without complications: Secondary | ICD-10-CM

## 2020-02-25 DIAGNOSIS — Y92512 Supermarket, store or market as the place of occurrence of the external cause: Secondary | ICD-10-CM | POA: Diagnosis not present

## 2020-02-25 DIAGNOSIS — C50911 Malignant neoplasm of unspecified site of right female breast: Secondary | ICD-10-CM

## 2020-02-25 MED ORDER — HYDROCODONE-ACETAMINOPHEN 5-325 MG PO TABS: ORAL_TABLET | ORAL | 0 refills | Status: DC

## 2020-02-25 MED ORDER — NAPROXEN 500 MG PO TABS: 500.0000 mg | ORAL_TABLET | Freq: Two times a day (BID) | ORAL | 5 refills | Status: DC

## 2020-02-25 NOTE — Progress Notes (Signed)
Patient [name] [name], female DOB:03-Jul-1966, 54 y.o. [id number]  Chief Complaint  Patient presents with  . Hip Pain    R/fell on 02/20/20 hurting bad today    HPI  LAVELLA MYREN is a 54 y.o. female who fell in parking lot of [place] on 02-20-2020 and hurt her right hip.  I have seen her in the past for left hip pain.  She has pain of the lateral right hip.  She is limping. She has used ice, heat, rest and medicine for the hip. She is still hurting.  She did not go to the ER.   Body mass index is 40.9 kg/m.  The patient meets the AMA guidelines for Morbid (severe) obesity with a BMI > 40.0 and I have recommended weight loss.   ROS  Review of Systems  Constitutional: Positive for activity change.  Musculoskeletal: Positive for arthralgias and myalgias.  Psychiatric/Behavioral: The patient is nervous/anxious.   All other systems reviewed and are negative.   All other systems reviewed and are negative.  The following is a summary of the past history medically, past history surgically, known current medicines, social history and family history.  This information is gathered electronically by the computer from prior information and documentation.  I review this each visit and have found including this information at this point in the chart is beneficial and informative.    Past Medical History:  Diagnosis Date  . Anxiety   . Blood transfusion without reported diagnosis 1999   due to heavy menses  . Breast cancer ([place]) 05/22/2011   03/01/11, Stage 2, s/p lumpectomy, chemo/xrt  . DDD (degenerative disc disease), lumbar   . Depression 06/22/2011  . Diverticulitis   . DM (diabetes mellitus) ([place]) 06/22/2011  . Genital warts   . GERD (gastroesophageal reflux disease) 06/22/2011  . Hx of adenomatous colonic polyps 10/2007   69m sigmoid tubular adenoma, FH colon cancer, mother in mid-50s  . Hypercholesterolemia 06/22/2011  . Hypertension 06/22/2011  . 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 Iron deficiency anemia 03/25/2015  . Morbid obesity ([place]   . Shortness of breath   . 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 Vaginal bleeding 03/08/2014    Past Surgical History:  Procedure Laterality Date  . back surg x2    . BREAST LUMPECTOMY Right 2012  . CHOLECYSTECTOMY    . COLONOSCOPY  11/03/07   4-mm sessile polyp removed/small internal hemorrhoids/tubular adenoma, random colon bx negative for microscopic colitis  . COLONOSCOPY WITH PROPOFOL N/A 09/17/2016   Grade 2 hemorrhoids, colonic diverticulosis, ascending colon polyp (sessile serrated adenoma), 5 year surveillance  . COLONOSCOPY, ESOPHAGOGASTRODUODENOSCOPY (EGD) AND ESOPHAGEAL DILATION  01/2012   mild gastritis s/p biopsy. Empiric dilation. Normal duodenum.   .Marland KitchenDILATATION & CURETTAGE/HYSTEROSCOPY WITH MYOSURE N/A 02/10/2015   Procedure: DILATATION & CURETTAGE/HYSTEROSCOPY WITH MYOSURE;  Surgeon: BNunzio Cobbs MD;  [facility];  Service: Gynecology;  Laterality: N/A;  . ESOPHAGOGASTRODUODENOSCOPY (EGD) WITH PROPOFOL N/A 01/11/2020   Procedure: ESOPHAGOGASTRODUODENOSCOPY (EGD) WITH PROPOFOL;  Surgeon: RDaneil Dolin MD;  [facility];  Service: Endoscopy;  Laterality: N/A;  10:30am-office rescheduled 2/22 @ 8:30am  . MALONEY DILATION N/A 01/11/2020   Procedure: MVenia MinksDILATION;  Surgeon: RDaneil Dolin MD;  [facility];  Service: Endoscopy;  Laterality: N/A;  . MM BREAST STEREO BX*L*R/S     rt.  .Marland KitchenPOLYPECTOMY  09/17/2016  Procedure: POLYPECTOMY;  Surgeon: Daneil Dolin, MD;  [facility];  Service: Endoscopy;;  ascending colon  . PORT-A-CATH REMOVAL  05/26/2012   Procedure: REMOVAL PORT-A-CATH;  Surgeon: Jamesetta So, MD;  Location: [facility];  Service: General;  Laterality: N/A;  Minor Room  . PORTACATH PLACEMENT      Family History  Problem Relation Age of Onset  . Colon cancer Mother        mid-50s, died  of metastatic disease  . Cancer Mother        liver  . Coronary artery disease Mother   . Diabetes type I Mother   . Peripheral vascular disease Mother        Carotid disease in her 52s  . Coronary artery disease Father        CAD in his 52s  . Diabetes Father   . Diabetes type I Father   . Hypertension Father   . Bipolar disorder Sister   . Coronary artery disease Brother        MI in his 74s  . Hypertension Brother   . Hypertension Maternal Grandmother   . Hyperlipidemia Maternal Grandmother   . Stroke Maternal Grandmother   . Hypertension Maternal Grandfather   . Diabetes Maternal Grandfather   . Diabetes Paternal Grandmother   . Heart disease Paternal Grandfather     Social History Social History   Tobacco Use  . Smoking status: Never Smoker  . Smokeless tobacco: Never Used  Substance Use Topics  . Alcohol use: No    Alcohol/week: 0.0 standard drinks  . Drug use: No    Allergies  Allergen Reactions  . Metformin And Related Diarrhea  . Flagyl [Metronidazole] Hives and Itching  . Lansoprazole   . Pravastatin Other (See Comments)    Muscle aches    Current Outpatient Medications  Medication Sig Dispense Refill  . atorvastatin (LIPITOR) 40 MG tablet Take 1 tablet by mouth once daily (Patient taking differently: Take 40 mg by mouth at bedtime. ) 90 tablet 0  . blood glucose meter kit and supplies KIT Dispense based on patient and insurance preference. TEST ONCE DAILY. DIABETES TYPE 2 ICD 10 CODE E11.9 1 each 5  . blood glucose meter kit and supplies Dispense based on patient and insurance preference. Use up to four times daily as directed. (FOR ICD-10 E10.9, E11.9). 1 each 5  . citalopram (CELEXA) 40 MG tablet Take 1 tablet by mouth once daily (Patient taking differently: Take 40 mg by mouth daily. ) 90 tablet 0  . Dulaglutide (TRULICITY) 7.82 NF/6.2ZH SOPN INJECT 1 SYRINGLE SUBCUTANEOUSLY ONCE A WEEK (Patient taking differently: Inject 0.75 mg into the skin once a  week. ) 4 mL 3  . HYDROcodone-acetaminophen (NORCO/VICODIN) 5-325 MG tablet One tablet by mouth every six hours as needed for pain.  Seven day limit 20 tablet 0  . lisinopril (ZESTRIL) 10 MG tablet Take 1 tablet by mouth once daily (Patient taking differently: Take 10 mg by mouth daily. ) 90 tablet 0  . metFORMIN (GLUCOPHAGE-XR) 750 MG 24 hr tablet Take 1 tablet by mouth once daily with breakfast (Patient taking differently: Take 750 mg by mouth daily with breakfast. ) 90 tablet 0  . Microlet Lancets MISC USE 1 LANCET TO CHECK GLUCOSE ONCE DAILY    . ondansetron (ZOFRAN) 4 MG tablet Take 1 tablet (4 mg total) by mouth every 8 (eight) hours as needed for nausea or vomiting. 30 tablet 1  . pantoprazole (PROTONIX)  40 MG tablet Take 1 tablet by mouth once daily (Patient taking differently: Take 40 mg by mouth every morning. ) 90 tablet 0   No current facility-administered medications for this visit.     Physical Exam  Blood pressure 122/78, pulse 80, temperature (!) 97.1 F (36.2 C), height '5\' 4"'$  (1.626 m), weight 238 lb 4 oz (108.1 kg), last menstrual period 04/05/2016.  Constitutional: overall normal hygiene, normal nutrition, well developed, normal grooming, normal body habitus. Assistive device:none  Musculoskeletal: gait and station Limp right, muscle tone and strength are normal, no tremors or atrophy is present.  .  Neurological: coordination overall normal.  Deep tendon reflex/nerve stretch intact.  Sensation normal.  Cranial nerves II-XII intact.   Skin:   Normal overall no scars, lesions, ulcers or rashes. No psoriasis.  Psychiatric: Alert and oriented x 3.  Recent memory intact, remote memory unclear.  Normal mood and affect. Well groomed.  Good eye contact.  Cardiovascular: overall no swelling, no varicosities, no edema bilaterally, normal temperatures of the legs and arms, no clubbing, cyanosis and good capillary refill.  Lymphatic: palpation is normal.  Left hip is not  painful today.  It has full  ROM.  She has pain over the right hip trochanteric area.  She has some bruising.  NV intact. ROM is full but tender.  Limp right.  All other systems reviewed and are negative   The patient has been educated about the nature of the problem(s) and counseled on treatment options.  The patient appeared to understand what I have discussed and is in agreement with it.  Encounter Diagnoses  Name Primary?  . Pain in right hip Yes  . Body mass index 40.0-44.9, adult ([place])   . Morbid obesity ([place])    X-rays were done of the right hip, reported separately.  PLAN Call if any problems.  Precautions discussed.  Continue current medications. I will refill her pain medicine and begin Naprosyn.  Return to clinic 2 weeks   I have reviewed the [place] web site prior to prescribing narcotic medicine for this patient.   Electronically Signed Sanjuana Kava, MD 4/8/20218:32 AM

## 2020-02-25 NOTE — Patient Instructions (Addendum)
Out of work until Monday 02/29/20. Needs to use crutches and stay off of her feet to help hip recover.

## 2020-02-26 ENCOUNTER — Other Ambulatory Visit: Payer: Self-pay | Admitting: Family Medicine

## 2020-03-01 ENCOUNTER — Ambulatory Visit: Payer: 59 | Attending: Internal Medicine

## 2020-03-01 ENCOUNTER — Ambulatory Visit: Payer: 59 | Admitting: Orthopaedic Surgery

## 2020-03-01 DIAGNOSIS — Z23 Encounter for immunization: Secondary | ICD-10-CM

## 2020-03-01 NOTE — Progress Notes (Signed)
   Covid-19 Vaccination Clinic  Name:  [name]    MRN: [id number] DOB: [birth year]  03/01/2020  [name] was observed post Covid-19 immunization for 15 minutes without incident. She was provided with Vaccine Information Sheet and instruction to access the V-Safe system.   [name] was instructed to call 911 with any severe reactions post vaccine: Marland Kitchen Difficulty breathing  . Swelling of face and throat  . A fast heartbeat  . A bad rash all over body  . Dizziness and weakness   Immunizations Administered    Name Date Dose VIS Date Route   Moderna COVID-19 Vaccine 03/01/2020 11:44 AM 0.5 mL 10/20/2019 Intramuscular   Manufacturer: Moderna   LotHQ:[id number]   GilbertDW:[id number]

## 2020-03-08 ENCOUNTER — Ambulatory Visit: Payer: 59 | Admitting: Orthopaedic Surgery

## 2020-03-10 ENCOUNTER — Telehealth: Payer: Self-pay | Admitting: Orthopaedic Surgery

## 2020-03-10 MED ORDER — HYDROCODONE-ACETAMINOPHEN 5-325 MG PO TABS: ORAL_TABLET | ORAL | 0 refills | Status: DC

## 2020-03-10 NOTE — Telephone Encounter (Signed)
Patient called for refill / reminded patient of policy regarding call in prior to noon on Thursdays: HYDROcodone-acetaminophen (NORCO/VICODIN) 5-325 MG tablet 28 tablet   - [place]

## 2020-03-15 ENCOUNTER — Ambulatory Visit: Payer: 59 | Admitting: Orthopaedic Surgery

## 2020-03-22 ENCOUNTER — Ambulatory Visit (INDEPENDENT_AMBULATORY_CARE_PROVIDER_SITE_OTHER): Payer: 59 | Admitting: Orthopaedic Surgery

## 2020-03-22 ENCOUNTER — Other Ambulatory Visit: Payer: Self-pay

## 2020-03-22 ENCOUNTER — Encounter: Payer: Self-pay | Admitting: Orthopaedic Surgery

## 2020-03-22 ENCOUNTER — Ambulatory Visit: Payer: 59

## 2020-03-22 VITALS — Temp 98.1°F | Ht 64.0 in | Wt 240.0 lb

## 2020-03-22 DIAGNOSIS — M25552 Pain in left hip: Secondary | ICD-10-CM | POA: Diagnosis not present

## 2020-03-22 DIAGNOSIS — M7062 Trochanteric bursitis, left hip: Secondary | ICD-10-CM | POA: Diagnosis not present

## 2020-03-22 DIAGNOSIS — Z6841 Body Mass Index (BMI) 40.0 and over, adult: Secondary | ICD-10-CM

## 2020-03-22 NOTE — Progress Notes (Signed)
Patient OI:[id number] Emily Filbert, female DOB:[birth year], 54 y.o. [id number]  Chief Complaint  Patient presents with  . Hip Pain    Bilateral hip pain    HPI  JASSMIN KEMMERER is a 54 y.o. female who has left hip pain and some right hip pain.  The right hip is better.  The left hip hurts laterally over the bursa of the trochanter.  She has no redness, no trauma.  The pain is deep.    Body mass index is 41.2 kg/m.  The patient meets the AMA guidelines for Morbid (severe) obesity with a BMI > 40.0 and I have recommended weight loss.   ROS  Review of Systems  Constitutional: Positive for activity change.  Musculoskeletal: Positive for arthralgias and myalgias.  Psychiatric/Behavioral: The patient is nervous/anxious.   All other systems reviewed and are negative.   All other systems reviewed and are negative.  The following is a summary of the past history medically, past history surgically, known current medicines, social history and family history.  This information is gathered electronically by the computer from prior information and documentation.  I review this each visit and have found including this information at this point in the chart is beneficial and informative.    Past Medical History:  Diagnosis Date  . Anxiety   . Blood transfusion without reported diagnosis 1999   due to heavy menses  . Breast cancer ([place]) 05/22/2011   03/01/11, Stage 2, s/p lumpectomy, chemo/xrt  . DDD (degenerative disc disease), lumbar   . Depression 06/22/2011  . Diverticulitis   . DM (diabetes mellitus) ([place]) 06/22/2011  . Genital warts   . GERD (gastroesophageal reflux disease) 06/22/2011  . Hx of adenomatous colonic polyps 10/2007   89m sigmoid tubular adenoma, FH colon cancer, mother in mid-50s  . Hypercholesterolemia 06/22/2011  . Hypertension 06/22/2011  . 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 05/22[place]  . Iron deficiency anemia 03/25/2015  . Morbid [place] ([place] [facility]   . Shortness of  breath   . 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 Vaginal bleeding 03/08/2014    Past Surgical History:  Procedure Laterality Date  . back surg x2    . BREAST LUMPECTOMY Right 2012  . CHOLECYSTECTOMY    . COLONOSCOPY  11/03/07   4-mm sessile polyp removed/small internal hemorrhoids/tubular adenoma, random colon bx negative for microscopic colitis  . COLONOSCOPY WITH PROPOFOL N/A 09/17/2016   Grade 2 hemorrhoids, colonic diverticulosis, ascending colon polyp (sessile serrated adenoma), 5 year surveillance  . COLONOSCOPY, ESOPHAGOGASTRODUODENOSCOPY (EGD) AND ESOPHAGEAL DILATION  01/2012   mild gastritis s/p biopsy. Empiric dilation. Normal duodenum.   .Marland KitchenDILATATION & CURETTAGE/HYSTEROSCOPY WITH MYOSURE N/A 02/10/2015   Procedure: DILATATION & CURETTAGE/HYSTEROSCOPY WITH MYOSURE;  Surgeon: BNunzio Cobbs MD;  [facility];  Service: Gynecology;  Laterality: N/A;  . ESOPHAGOGASTRODUODENOSCOPY (EGD) WITH PROPOFOL N/A 01/11/2020   Procedure: ESOPHAGOGASTRODUODENOSCOPY (EGD) WITH PROPOFOL;  Surgeon: RDaneil Dolin MD;  [facility];  Service: Endoscopy;  Laterality: N/A;  10:30am-office rescheduled 2/22 @ 8:30am  . MALONEY DILATION N/A 01/11/2020   Procedure: MVenia MinksDILATION;  Surgeon: RDaneil Dolin MD;  [facility];  Service: Endoscopy;  Laterality: N/A;  . MM BREAST STEREO BX*L*R/S     rt.  .Marland KitchenPOLYPECTOMY  09/17/2016   Procedure: POLYPECTOMY;  Surgeon: RDaneil Dolin MD;  [facility];  Service: Endoscopy;;  ascending colon  . PORT-A-CATH REMOVAL  05/26/2012   Procedure: REMOVAL PORT-A-CATH;  Surgeon: Jamesetta So, MD;  Location: [facility];  Service: General;  Laterality: N/A;  Minor Room  . PORTACATH PLACEMENT      Family History  Problem Relation Age of Onset  . Colon cancer Mother        mid-50s, died of metastatic disease  . Cancer Mother        liver  . Coronary artery disease Mother   . Diabetes  type I Mother   . Peripheral vascular disease Mother        Carotid disease in her 30s  . Coronary artery disease Father        CAD in his 78s  . Diabetes Father   . Diabetes type I Father   . Hypertension Father   . Bipolar disorder Sister   . Coronary artery disease Brother        MI in his 67s  . Hypertension Brother   . Hypertension Maternal Grandmother   . Hyperlipidemia Maternal Grandmother   . Stroke Maternal Grandmother   . Hypertension Maternal Grandfather   . Diabetes Maternal Grandfather   . Diabetes Paternal Grandmother   . Heart disease Paternal Grandfather     Social History Social History   Tobacco Use  . Smoking status: Never Smoker  . Smokeless tobacco: Never Used  Substance Use Topics  . Alcohol use: No    Alcohol/week: 0.0 standard drinks  . Drug use: No    Allergies  Allergen Reactions  . Metformin And Related Diarrhea  . Flagyl [Metronidazole] Hives and Itching  . Lansoprazole   . Pravastatin Other (See Comments)    Muscle aches    Current Outpatient Medications  Medication Sig Dispense Refill  . atorvastatin (LIPITOR) 40 MG tablet Take 1 tablet by mouth once daily (Patient taking differently: Take 40 mg by mouth at bedtime. ) 90 tablet 0  . blood glucose meter kit and supplies KIT Dispense based on patient and insurance preference. TEST ONCE DAILY. DIABETES TYPE 2 ICD 10 CODE E11.9 1 each 5  . blood glucose meter kit and supplies Dispense based on patient and insurance preference. Use up to four times daily as directed. (FOR ICD-10 E10.9, E11.9). 1 each 5  . citalopram (CELEXA) 40 MG tablet Take 1 tablet by mouth once daily (Patient taking differently: Take 40 mg by mouth daily. ) 90 tablet 0  . Dulaglutide (TRULICITY) 4.56 YB/6.3SL SOPN Inject 0.75 mg subcu once weekly 4 pen 0  . HYDROcodone-acetaminophen (NORCO/VICODIN) 5-325 MG tablet One tablet by mouth every six hours as needed for pain.  Seven day limit 28 tablet 0  . lisinopril (ZESTRIL)  10 MG tablet Take 1 tablet by mouth once daily (Patient taking differently: Take 10 mg by mouth daily. ) 90 tablet 0  . metFORMIN (GLUCOPHAGE-XR) 750 MG 24 hr tablet Take 1 tablet by mouth once daily with breakfast (Patient taking differently: Take 750 mg by mouth daily with breakfast. ) 90 tablet 0  . Microlet Lancets MISC USE 1 LANCET TO CHECK GLUCOSE ONCE DAILY    . naproxen (NAPROSYN) 500 MG tablet Take 1 tablet (500 mg total) by mouth 2 (two) times daily with a meal. 60 tablet 5  . ondansetron (ZOFRAN) 4 MG tablet Take 1 tablet (4 mg total) by mouth every 8 (eight) hours as needed for nausea or vomiting. 30 tablet 1  . pantoprazole (PROTONIX) 40 MG tablet Take 1 tablet by mouth once daily (Patient taking differently:  Take 40 mg by mouth every morning. ) 90 tablet 0   No current facility-administered medications for this visit.     Physical Exam  Temperature 98.1 F (36.7 C), height '5\' 4"'$  (1.626 m), weight 240 lb (108.9 kg), last menstrual period 04/05/2016.  Constitutional: overall normal hygiene, normal nutrition, well developed, normal grooming, normal body habitus. Assistive device:none  Musculoskeletal: gait and station Limp left, muscle tone and strength are normal, no tremors or atrophy is present.  .  Neurological: coordination overall normal.  Deep tendon reflex/nerve stretch intact.  Sensation normal.  Cranial nerves II-XII intact.   Skin:   Normal overall no scars, lesions, ulcers or rashes. No psoriasis.  Psychiatric: Alert and oriented x 3.  Recent memory intact, remote memory unclear.  Normal mood and affect. Well groomed.  Good eye contact.  Cardiovascular: overall no swelling, no varicosities, no edema bilaterally, normal temperatures of the legs and arms, no clubbing, cyanosis and good capillary refill.  Lymphatic: palpation is normal.  Left hip is tender over the trochanteric bursa.  ROM is full.  Right hip has full ROM.  NV intact.  All other systems reviewed  and are negative   The patient has been educated about the nature of the problem(s) and counseled on treatment options.  The patient appeared to understand what I have discussed and is in agreement with it.  Encounter Diagnoses  Name Primary?  . Pain in left hip Yes  . Trochanteric bursitis, left hip   x-rays were done of the left hip, reported separately.  PROCEDURE NOTE:  The patient request injection, verbal consent was obtained.  The left trochanteric area of the hip was prepped appropriately after time out was performed.   Sterile technique was observed and injection of 1 cc of Depo-Medrol 40 mg with several cc's of plain xylocaine. Anesthesia was provided by ethyl chloride and a 20-gauge needle was used to inject the hip area. The injection was tolerated well.  A band aid dressing was applied.  The patient was advised to apply ice later today and tomorrow to the injection sight as needed.   PLAN Call if any problems.  Precautions discussed.  Continue current medications.   Return to clinic 2 weeks   Electronically Signed Sanjuana Kava, MD 5/4/20212:00 PM

## 2020-03-28 ENCOUNTER — Telehealth: Payer: Self-pay | Admitting: Family Medicine

## 2020-03-28 ENCOUNTER — Other Ambulatory Visit: Payer: Self-pay | Admitting: Family Medicine

## 2020-03-28 ENCOUNTER — Telehealth (INDEPENDENT_AMBULATORY_CARE_PROVIDER_SITE_OTHER): Payer: 59 | Admitting: Family Medicine

## 2020-03-28 ENCOUNTER — Telehealth: Payer: Self-pay

## 2020-03-28 ENCOUNTER — Other Ambulatory Visit: Payer: Self-pay

## 2020-03-28 DIAGNOSIS — Z79899 Other long term (current) drug therapy: Secondary | ICD-10-CM | POA: Diagnosis not present

## 2020-03-28 DIAGNOSIS — E7800 Pure hypercholesterolemia, unspecified: Secondary | ICD-10-CM

## 2020-03-28 DIAGNOSIS — I10 Essential (primary) hypertension: Secondary | ICD-10-CM

## 2020-03-28 DIAGNOSIS — E119 Type 2 diabetes mellitus without complications: Secondary | ICD-10-CM | POA: Diagnosis not present

## 2020-03-28 MED ORDER — CITALOPRAM HYDROBROMIDE 40 MG PO TABS: 40.0000 mg | ORAL_TABLET | Freq: Every day | ORAL | 1 refills | Status: DC

## 2020-03-28 MED ORDER — HYDROCODONE-ACETAMINOPHEN 5-325 MG PO TABS: ORAL_TABLET | ORAL | 0 refills | Status: DC

## 2020-03-28 MED ORDER — PANTOPRAZOLE SODIUM 40 MG PO TBEC: 40.0000 mg | DELAYED_RELEASE_TABLET | Freq: Every morning | ORAL | 1 refills | Status: DC

## 2020-03-28 MED ORDER — TRULICITY 0.75 MG/0.5ML ~~LOC~~ SOAJ: SUBCUTANEOUS | 5 refills | Status: DC

## 2020-03-28 MED ORDER — LISINOPRIL 10 MG PO TABS: 10.0000 mg | ORAL_TABLET | Freq: Every day | ORAL | 1 refills | Status: DC

## 2020-03-28 MED ORDER — ATORVASTATIN CALCIUM 40 MG PO TABS: 40.0000 mg | ORAL_TABLET | Freq: Every day | ORAL | 1 refills | Status: DC

## 2020-03-28 MED ORDER — METFORMIN HCL ER 750 MG PO TB24: 750.0000 mg | ORAL_TABLET | Freq: Every day | ORAL | 1 refills | Status: DC

## 2020-03-28 NOTE — Progress Notes (Signed)
   Subjective:  Audio plus videoPatient ID: [name], female    DOB: Aug 12, 1966, 54 y.o.   MRN: [id number]  Diabetes She presents for her follow-up diabetic visit. She has type 2 diabetes mellitus. There are no hypoglycemic associated symptoms. There are no diabetic associated symptoms. There are no hypoglycemic complications. There are no diabetic complications. Compliance with diabetes treatment: Pt checks blood sugar once a day but admits to forgetting at times.  Weight trend: has lost 20 lbs. Diabetic current diet: Pt has cut back on breads and soft drinks. Working on sweet tea consumption    Virtual Visit via Video Note  I connected with [name] on 03/28/20 at  9:30 AM EDT by a video enabled telemedicine application and verified that I am speaking with the correct person using two identifiers.  Location: Patient: home Provider: office   I discussed the limitations of evaluation and management by telemedicine and the availability of in person appointments. The patient expressed understanding and agreed to proceed.  History of Present Illness:    Observations/Objective:   Assessment and Plan:   Follow Up Instructions:    I discussed the assessment and treatment plan with the patient. The patient was provided an opportunity to ask questions and all were answered. The patient agreed with the plan and demonstrated an understanding of the instructions.   The patient was advised to call back or seek an in-person evaluation if the symptoms worsen or if the condition fails to improve as anticipated.  I provided 23 minutes of non-face-to-face time during this encounter.   Patient continues to take lipid medication regularly. No obvious side effects from it. Generally does not miss a dose. Prior blood work results are reviewed with patient. Patient continues to work on fat intake in diet  Blood pressure medicine and blood pressure levels reviewed today with patient.  Compliant with blood pressure medicine. States does not miss a dose. No obvious side effects. Blood pressure generally good when checked elsewhere. Watching salt intake.      Review of Systems No headache no chest pain no shortness of breath    Objective:   Physical Exam Virtual     Assessment & Plan:  Impression 1 type 2 diabetes.  Apparent good control discussed need to check A1c  2.  Hypertension.  Blood pressure good when checked elsewhere compliant discussed to maintain  3.  Hyperlipidemia.  Status uncertain recommend checking  4.  Depression clinically stable patient maintain same meds  Follow-up in 6 months diet exercise discussed appropriate blood work

## 2020-03-28 NOTE — Telephone Encounter (Signed)
Hydrocodone-Acetaminophen 5/325 mg   Qty 28 Tablets  PATIENT USES [facility]

## 2020-03-28 NOTE — Telephone Encounter (Signed)
Ms. naquana, veksler are scheduled for a virtual visit with your provider today.    Just as we do with appointments in the office, we must obtain your consent to participate.  Your consent will be active for this visit and any virtual visit you may have with one of our providers in the next 365 days.    If you have a MyChart account, I can also send a copy of this consent to you electronically.  All virtual visits are billed to your insurance company just like a traditional visit in the office.  As this is a virtual visit, video technology does not allow for your provider to perform a traditional examination.  This may limit your provider's ability to fully assess your condition.  If your provider identifies any concerns that need to be evaluated in person or the need to arrange testing such as labs, EKG, etc, we will make arrangements to do so.    Although advances in technology are sophisticated, we cannot ensure that it will always work on either your end or our end.  If the connection with a video visit is poor, we may have to switch to a telephone visit.  With either a video or telephone visit, we are not always able to ensure that we have a secure connection.   I need to obtain your verbal consent now.   Are you willing to proceed with your visit today?   ROSEBUD SHARPLESS has provided verbal consent on 03/28/2020 for a virtual visit (video or telephone).   [name], LPN [id number]  [phone] AM

## 2020-04-05 ENCOUNTER — Ambulatory Visit: Payer: 59 | Admitting: Orthopaedic Surgery

## 2020-04-06 LAB — LIPID PANEL
Chol/HDL Ratio: 3.3 ratio (ref 0.0–4.4)
Cholesterol, Total: 157 mg/dL (ref 100–199)
HDL: 48 mg/dL (ref >39)
LDL Chol Calc (NIH): 83 mg/dL (ref 0–99)
Triglycerides: 151 mg/dL — ABNORMAL HIGH (ref 0–149)
VLDL Cholesterol Cal: 26 mg/dL (ref 5–40)

## 2020-04-06 LAB — HEPATIC FUNCTION PANEL
ALT: 14 IU/L (ref 0–32)
AST: 19 IU/L (ref 0–40)
Albumin: 4.1 g/dL (ref 3.8–4.9)
Alkaline Phosphatase: 75 IU/L (ref 48–121)
Bilirubin Total: 0.3 mg/dL (ref 0.0–1.2)
Bilirubin, Direct: 0.11 mg/dL (ref 0.00–0.40)
Total Protein: 7 g/dL (ref 6.0–8.5)

## 2020-04-06 LAB — HEMOGLOBIN A1C
Est. average glucose Bld gHb Est-mCnc: 126 mg/dL
Hgb A1c MFr Bld: 6 % — ABNORMAL HIGH (ref 4.8–5.6)

## 2020-04-07 ENCOUNTER — Encounter: Payer: Self-pay | Admitting: Family Medicine

## 2020-04-09 ENCOUNTER — Other Ambulatory Visit: Payer: Self-pay

## 2020-04-09 ENCOUNTER — Encounter: Payer: Self-pay | Admitting: Emergency Medicine

## 2020-04-09 ENCOUNTER — Ambulatory Visit
Admission: EM | Admit: 2020-04-09 | Discharge: 2020-04-09 | Disposition: A | Discharge status: Home / Self Care | Payer: 59 | Attending: Emergency Medicine | Admitting: Emergency Medicine

## 2020-04-09 ENCOUNTER — Ambulatory Visit (INDEPENDENT_AMBULATORY_CARE_PROVIDER_SITE_OTHER): Payer: 59

## 2020-04-09 ENCOUNTER — Ambulatory Visit: Payer: 59

## 2020-04-09 DIAGNOSIS — M79671 Pain in right foot: Secondary | ICD-10-CM

## 2020-04-09 DIAGNOSIS — S92501A Displaced unspecified fracture of right lesser toe(s), initial encounter for closed fracture: Secondary | ICD-10-CM | POA: Diagnosis not present

## 2020-04-09 NOTE — ED Triage Notes (Signed)
Pt here for right foot pain after dropping a toaster on foot last night; bruising noted around 2nd digit

## 2020-04-09 NOTE — Discharge Instructions (Addendum)
The right foot x-ray is positive for minimally displaced fracture of the head of the second toe  Follow-up with orthopedic Follow RICE instruction Take OTC Tylenol/ibuprofen as needed for pain Return or go to ED for worsening of symptoms

## 2020-04-09 NOTE — ED Provider Notes (Signed)
[facility]    CSN: [id number] Arrival date & time: 04/09/20  1246      History   Chief Complaint Chief Complaint  Patient presents with  . Foot Pain    HPI [name] is a 54 y.o. female.   Who presented to the urgent care for complaint of right foot pain that started last night.Patient reports she dropped a toes to the right foot.   He localizes the pain to the right foot.  She described the pain as constant and achy.  Has tried OTC tenderness ibuprofen as needed for pain without relief.  His symptoms are made worse with ROM.  He denies similar symptoms in the past.  Denies chills, fever, nausea, vomiting and diarrhea.  The history is provided by the patient. No language interpreter was used.    Past Medical History:  Diagnosis Date  . Anxiety   . Blood transfusion without reported diagnosis 1999   due to heavy menses  . Breast cancer ([place]) 05/22/2011   03/01/11, Stage 2, s/p lumpectomy, chemo/xrt  . DDD (degenerative disc disease), lumbar   . Depression 06/22/2011  . Diverticulitis   . DM (diabetes mellitus) ([place]) 06/22/2011  . Genital warts   . GERD (gastroesophageal reflux disease) 06/22/2011  . Hx of adenomatous colonic polyps 10/2007   76m sigmoid tubular adenoma, FH colon cancer, mother in mid-50s  . Hypercholesterolemia 06/22/2011  . Hypertension 06/22/2011  . 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[place] 05/22/2011  . Iron deficiency anemia 03/25/2015  . Morbid obesity ([place]   . Shortness of breath   . 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 Vaginal bleeding 03/08/2014    Patient Active Problem List   Diagnosis Date Noted  . 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 Abdominal pain 12/22/2019  . Central adiposity 04/12/2018  . Seasonal affective disorder ([place] 12/15/2017  . Chest pain 02/27/2017  . 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 Panic disorder 01/09/2017  . Rectal bleeding 09/06/2016  . Abdominal cramping 09/06/2016  . Fatty liver 09/06/2016  . Dehydration 12/15/2015  . Orthostatic dizziness 12/15/2015  . Nausea vomiting and diarrhea 12/15/2015  . Iron deficiency anemia 03/25/2015  . Excessive or frequent menstruation 04/08/2014  . Dysmenorrhea 04/08/2014  . H/O adenomatous polyp of colon 01/24/2012  . FH: colon cancer 01/24/2012  . Esophageal dysphagia 01/24/2012  . Chronic diarrhea 01/24/2012  . Depression 06/22/2011  . GERD (gastroesophageal reflux disease) 06/22/2011  . DM (diabetes mellitus) ([place] 06/22/2011  . Hypercholesterolemia 06/22/2011  . Hypertension 06/22/2011  . Obesity 06/22/2011  . 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[place] 05/22/2011    Past Surgical History:  Procedure Laterality Date  . back surg x2    . BREAST LUMPECTOMY Right 2012  . CHOLECYSTECTOMY    . COLONOSCOPY  11/03/07   4-mm sessile polyp removed/small internal hemorrhoids/tubular adenoma, random colon bx negative for microscopic colitis  . COLONOSCOPY WITH PROPOFOL N/A 09/17/2016   Grade 2 hemorrhoids, colonic diverticulosis, ascending colon polyp (sessile serrated adenoma), 5 year surveillance  . COLONOSCOPY, ESOPHAGOGASTRODUODENOSCOPY (EGD) AND ESOPHAGEAL DILATION  01/2012   mild gastritis s/p biopsy. Empiric dilation. Normal duodenum.   .Marland KitchenDILATATION & CURETTAGE/HYSTEROSCOPY WITH MYOSURE N/A 02/10/2015   Procedure: DILATATION & CURETTAGE/HYSTEROSCOPY WITH MYOSURE;  Surgeon: BNunzio Cobbs MD;  [facility];  Service: Gynecology;  Laterality: N/A;  . ESOPHAGOGASTRODUODENOSCOPY (EGD) WITH PROPOFOL N/A 01/11/2020   Procedure: ESOPHAGOGASTRODUODENOSCOPY (EGD) WITH PROPOFOL;  Surgeon: Daneil Dolin, MD;  [facility];  Service: Endoscopy;  Laterality: N/A;  10:30am-office rescheduled 2/22 @ 8:30am  . MALONEY DILATION N/A 01/11/2020   Procedure: Venia Minks  DILATION;  Surgeon: Daneil Dolin, MD;  [facility];  Service: Endoscopy;  Laterality: N/A;  . MM BREAST STEREO BX*L*R/S     rt.  Marland Kitchen POLYPECTOMY  09/17/2016   Procedure: POLYPECTOMY;  Surgeon: Daneil Dolin, MD;  [facility];  Service: Endoscopy;;  ascending colon  . PORT-A-CATH REMOVAL  05/26/2012   Procedure: REMOVAL PORT-A-CATH;  Surgeon: Jamesetta So, MD;  [facility]: [facility];  Service: General;  Laterality: N/A;  Minor Room  . PORTACATH PLACEMENT      OB History    Gravida  4   Para  3   Term  0   Preterm  0   AB  1   Living  3     SAB  0   TAB  0   Ectopic  0   Multiple  0   Live Births  3            Home Medications    Prior to Admission medications   Medication Sig Start Date End Date Taking? Authorizing Provider  atorvastatin (LIPITOR) 40 MG tablet Take 1 tablet (40 mg total) by mouth at bedtime. 03/28/20   Mikey Kirschner, MD  blood glucose meter kit and supplies KIT Dispense based on patient and insurance preference. TEST ONCE DAILY. DIABETES TYPE 2 ICD 10 CODE E11.9 08/26/18   Cheyenne Adas, NP  blood glucose meter kit and supplies Dispense based on patient and insurance preference. Use up to four times daily as directed. (FOR ICD-10 E10.9, E11.9). 01/05/20   Mikey Kirschner, MD  citalopram (CELEXA) 40 MG tablet Take 1 tablet (40 mg total) by mouth daily. 03/28/20   Mikey Kirschner, MD  Dulaglutide (TRULICITY) 3.41 DQ/2.2WL SOPN Inject 0.75 mg subcu once weekly 03/28/20   Mikey Kirschner, MD  HYDROcodone-acetaminophen (NORCO/VICODIN) 5-325 MG tablet One tablet by mouth every six hours as needed for pain.  Seven day limit 03/28/20   Sanjuana Kava, MD  lisinopril (ZESTRIL) 10 MG tablet Take 1 tablet (10 mg total) by mouth daily. 03/28/20   Mikey Kirschner, MD  metFORMIN (GLUCOPHAGE-XR) 750 MG 24 hr tablet Take 1 tablet (750 mg total) by mouth daily with breakfast. 03/28/20   Mikey Kirschner, MD  Microlet Lancets MISC USE 1  LANCET TO CHECK GLUCOSE ONCE DAILY 05/29/19   [provider]  pantoprazole (PROTONIX) 40 MG tablet Take 1 tablet (40 mg total) by mouth every morning. 03/28/20   Mikey Kirschner, MD    Family History Family History  Problem Relation Age of Onset  . Colon cancer Mother        mid-50s, died of metastatic disease  . Cancer Mother        liver  . Coronary artery disease Mother   . Diabetes type I Mother   . Peripheral vascular disease Mother        Carotid disease in her 54s  . Coronary artery disease Father        CAD in his 41s  . Diabetes Father   . Diabetes type I Father   . Hypertension Father   . Bipolar disorder Sister   . Coronary artery  disease Brother        MI in his 64s  . Hypertension Brother   . Hypertension Maternal Grandmother   . Hyperlipidemia Maternal Grandmother   . Stroke Maternal Grandmother   . Hypertension Maternal Grandfather   . Diabetes Maternal Grandfather   . Diabetes Paternal Grandmother   . Heart disease Paternal Grandfather     Social History Social History   Tobacco Use  . Smoking status: Never Smoker  . Smokeless tobacco: Never Used  Substance Use Topics  . Alcohol use: No    Alcohol/week: 0.0 standard drinks  . Drug use: No     Allergies   Metformin and related, Flagyl [metronidazole], Lansoprazole, and Pravastatin   Review of Systems Review of Systems  Constitutional: Negative.   Respiratory: Negative.   Cardiovascular: Negative.   Musculoskeletal: Positive for arthralgias.  Skin: Positive for color change.  All other systems reviewed and are negative.    Physical Exam Triage Vital Signs ED Triage Vitals  Enc Vitals Group     BP 04/09/20 1256 (!) 169/82     Pulse Rate 04/09/20 1256 85     Resp 04/09/20 1256 18     Temp 04/09/20 1256 98.5 F (36.9 C)     Temp Source 04/09/20 1256 Oral     SpO2 04/09/20 1256 95 %     Weight --      Height --      Head Circumference --      Peak Flow --      Pain Score  04/09/20 1257 8     Pain Loc --      Pain Edu? --      Excl. in [place]? --    No data found.  Updated Vital Signs BP (!) 169/82 (BP Location: Right Arm)   Pulse 85   Temp 98.5 F (36.9 C) (Oral)   Resp 18   LMP 04/05/2016 Comment: spotting 11/2016 and 12/2016  SpO2 95%   Visual Acuity Right Eye Distance:   Left Eye Distance:   Bilateral Distance:    Right Eye Near:   Left Eye Near:    Bilateral Near:     Physical Exam Vitals and nursing note reviewed.  Constitutional:      General: She is not in acute distress.    Appearance: Normal appearance. She is normal weight. She is not ill-appearing, toxic-appearing or diaphoretic.  Cardiovascular:     Rate and Rhythm: Normal rate and regular rhythm.     Pulses: Normal pulses.     Heart sounds: Normal heart sounds. No murmur. No friction rub. No gallop.   Pulmonary:     Effort: Pulmonary effort is normal. No respiratory distress.     Breath sounds: Normal breath sounds. No stridor. No wheezing, rhonchi or rales.  Chest:     Chest wall: No tenderness.  Musculoskeletal:        General: Tenderness and signs of injury present.     Right foot: Swelling and tenderness present.     Left foot: Normal.     Comments: The right foot is without any obvious asymmetry y compared to the left foot.  There is swelling present on the second and third toe.  No ecchymosis, warmth, open wound present.  Limited range of motion due to pain.  Neurovascular status intact.  Neurological:     Mental Status: She is alert and oriented to person, place, and time.     Cranial Nerves: No cranial nerve deficit.  Sensory: No sensory deficit.      UC Treatments / Results  Labs (all labs ordered are listed, but only abnormal results are displayed) Labs Reviewed - No data to display  EKG   Radiology DG Foot Complete Right  Result Date: 04/09/2020 CLINICAL DATA:  Dorsal foot and 2nd toe pain after injury last night. EXAM: RIGHT FOOT COMPLETE - 3+ VIEW  COMPARISON:  Radiographs 04/10/2017. FINDINGS: There is a minimally displaced fracture through the head of the 2nd proximal phalanx. This demonstrates no definite intra-articular extension. No other evidence of acute fracture or dislocation. Stable mild midfoot degenerative changes and chronic spurring along the base of the 5th metatarsal. Small calcaneal spurs are noted. IMPRESSION: Minimally displaced fracture through the head of the 2nd proximal phalanx. Electronically Signed   By: Richardean Sale M.D.   On: 04/09/2020 13:40    Procedures Procedures (including critical care time)  Medications Ordered in UC Medications - No data to display  Initial Impression / Assessment and Plan / UC Course  I have reviewed the triage vital signs and the nursing notes.  Pertinent labs & imaging results that were available during my care of the patient were reviewed by me and considered in my medical decision making (see chart for details).    Patient is stable for discharge.  Right foot x-ray positive for minimally displaced fracture of the head of the second proximal phalanx.  I have reviewed the x-ray myself and the radiologist interpretation.  I am in agreement with the radiologist interpretation.  Was advised to continue to take ibuprofen 800 mg as prescribed for pain management.  Follow-up with orthopedic for further reevaluation. Postop shoe was applied in office.  Final Clinical Impressions(s) / UC Diagnoses   Final diagnoses:  Closed fracture of phalanx of right second toe, initial encounter  Right foot pain     Discharge Instructions     The right foot x-ray is positive for minimally displaced fracture of the head of the second toe  Follow-up with orthopedic Follow RICE instruction Take OTC Tylenol/ibuprofen as needed for pain Return or go to ED for worsening of symptoms    ED Prescriptions    None     PDMP not reviewed this encounter.   Emerson Monte, FNP 04/09/20  1408

## 2020-04-12 ENCOUNTER — Encounter: Payer: Self-pay | Admitting: Orthopaedic Surgery

## 2020-04-12 ENCOUNTER — Ambulatory Visit (INDEPENDENT_AMBULATORY_CARE_PROVIDER_SITE_OTHER): Payer: 59 | Admitting: Orthopaedic Surgery

## 2020-04-12 ENCOUNTER — Other Ambulatory Visit: Payer: Self-pay

## 2020-04-12 VITALS — Ht 64.0 in | Wt 246.0 lb

## 2020-04-12 DIAGNOSIS — S92521A Displaced fracture of medial phalanx of right lesser toe(s), initial encounter for closed fracture: Secondary | ICD-10-CM

## 2020-04-12 DIAGNOSIS — Z6841 Body Mass Index (BMI) 40.0 and over, adult: Secondary | ICD-10-CM

## 2020-04-12 NOTE — Patient Instructions (Signed)
Out of work note from 04/12/20 to 04/18/20. Return to work on 04/19/20.

## 2020-04-12 NOTE — Progress Notes (Signed)
Patient KG:[id number] [name], female DOB:[birth year], 54 y.o. GUY:[id number]  Chief Complaint  Patient presents with  . Foot Injury    Rt foot DOI 04/08/20    HPI  [name] is a 54 y.o. female who dropped a four slice toaster on her foot and hurt her right second toe 04-09-2020.  I have reviewed the ER report and x-rays.  X-rays showed: IMPRESSION: Minimally displaced fracture through the head of the 2nd proximal phalanx.  I have independently reviewed and interpreted x-rays of this patient done at another site by another physician or qualified health professional.  She was given post op shoe. She has no other injury.  Pain is less.   Body mass index is 42.23 kg/m.  The patient meets the AMA guidelines for Morbid (severe) obesity with a BMI > 40.0 and I have recommended weight loss.   ROS  Review of Systems  All other systems reviewed and are negative.  The following is a summary of the past history medically, past history surgically, known current medicines, social history and family history.  This information is gathered electronically by the computer from prior information and documentation.  I review this each visit and have found including this information at this point in the chart is beneficial and informative.    Past Medical History:  Diagnosis Date  . Anxiety   . Blood transfusion without reported diagnosis 1999   due to heavy menses  . Breast cancer ([place]) 05/22/2011   03/01/11, Stage 2, s/p lumpectomy, chemo/xrt  . DDD (degenerative disc disease), lumbar   . Depression 06/22/2011  . Diverticulitis   . DM (diabetes mellitus) ([place]) 06/22/2011  . Genital warts   . GERD (gastroesophageal reflux disease) 06/22/2011  . Hx of adenomatous colonic polyps 10/2007   38m sigmoid tubular adenoma, FH colon cancer, mother in mid-50s  . Hypercholesterolemia 06/22/2011  . Hypertension 06/22/2011  . 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[place] 05/22/2011  . Iron deficiency anemia  03/25/2015  . Morbid obesity ([place]   . Shortness of breath   . 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 Vaginal bleeding 03/08/2014    Past Surgical History:  Procedure Laterality Date  . back surg x2    . BREAST LUMPECTOMY Right 2012  . CHOLECYSTECTOMY    . COLONOSCOPY  11/03/07   4-mm sessile polyp removed/small internal hemorrhoids/tubular adenoma, random colon bx negative for microscopic colitis  . COLONOSCOPY WITH PROPOFOL N/A 09/17/2016   Grade 2 hemorrhoids, colonic diverticulosis, ascending colon polyp (sessile serrated adenoma), 5 year surveillance  . COLONOSCOPY, ESOPHAGOGASTRODUODENOSCOPY (EGD) AND ESOPHAGEAL DILATION  01/2012   mild gastritis s/p biopsy. Empiric dilation. Normal duodenum.   .Marland KitchenDILATATION & CURETTAGE/HYSTEROSCOPY WITH MYOSURE N/A 02/10/2015   Procedure: DILATATION & CURETTAGE/HYSTEROSCOPY WITH MYOSURE;  Surgeon: BNunzio Cobbs MD;  [facility];  Service: Gynecology;  Laterality: N/A;  . ESOPHAGOGASTRODUODENOSCOPY (EGD) WITH PROPOFOL N/A 01/11/2020   Procedure: ESOPHAGOGASTRODUODENOSCOPY (EGD) WITH PROPOFOL;  Surgeon: RDaneil Dolin MD;  [facility];  Service: Endoscopy;  Laterality: N/A;  10:30am-office rescheduled 2/22 @ 8:30am  . MALONEY DILATION N/A 01/11/2020   Procedure: MVenia MinksDILATION;  Surgeon: RDaneil Dolin MD;  [facility];  Service: Endoscopy;  Laterality: N/A;  . MM BREAST STEREO BX*L*R/S     rt.  .Marland KitchenPOLYPECTOMY  09/17/2016   Procedure: POLYPECTOMY;  Surgeon: RDaneil Dolin MD;  [facility];  Service: Endoscopy;;  ascending colon  . PORT-A-CATH REMOVAL  05/26/2012   Procedure: REMOVAL PORT-A-CATH;  Surgeon: Jamesetta So, MD;  [facility];  Service: General;  Laterality: N/A;  Minor Room  . PORTACATH PLACEMENT      Family History  Problem Relation Age of Onset  . Colon cancer Mother        mid-50s, died of metastatic disease  . Cancer Mother         liver  . Coronary artery disease Mother   . Diabetes type I Mother   . Peripheral vascular disease Mother        Carotid disease in her 3s  . Coronary artery disease Father        CAD in his 48s  . Diabetes Father   . Diabetes type I Father   . Hypertension Father   . Bipolar disorder Sister   . Coronary artery disease Brother        MI in his 64s  . Hypertension Brother   . Hypertension Maternal Grandmother   . Hyperlipidemia Maternal Grandmother   . Stroke Maternal Grandmother   . Hypertension Maternal Grandfather   . Diabetes Maternal Grandfather   . Diabetes Paternal Grandmother   . Heart disease Paternal Grandfather     Social History Social History   Tobacco Use  . Smoking status: Never Smoker  . Smokeless tobacco: Never Used  Substance Use Topics  . Alcohol use: No    Alcohol/week: 0.0 standard drinks  . Drug use: No    Allergies  Allergen Reactions  . Metformin And Related Diarrhea  . Flagyl [Metronidazole] Hives and Itching  . Lansoprazole   . Pravastatin Other (See Comments)    Muscle aches    Current Outpatient Medications  Medication Sig Dispense Refill  . atorvastatin (LIPITOR) 40 MG tablet Take 1 tablet (40 mg total) by mouth at bedtime. 90 tablet 1  . blood glucose meter kit and supplies KIT Dispense based on patient and insurance preference. TEST ONCE DAILY. DIABETES TYPE 2 ICD 10 CODE E11.9 1 each 5  . blood glucose meter kit and supplies Dispense based on patient and insurance preference. Use up to four times daily as directed. (FOR ICD-10 E10.9, E11.9). 1 each 5  . citalopram (CELEXA) 40 MG tablet Take 1 tablet (40 mg total) by mouth daily. 90 tablet 1  . Dulaglutide (TRULICITY) 1.61 WR/6.0AV SOPN Inject 0.75 mg subcu once weekly 4 pen 5  . HYDROcodone-acetaminophen (NORCO/VICODIN) 5-325 MG tablet One tablet by mouth every six hours as needed for pain.  Seven day limit 28 tablet 0  . lisinopril (ZESTRIL) 10 MG tablet Take 1 tablet (10 mg total)  by mouth daily. 90 tablet 1  . metFORMIN (GLUCOPHAGE-XR) 750 MG 24 hr tablet Take 1 tablet (750 mg total) by mouth daily with breakfast. 90 tablet 1  . Microlet Lancets MISC USE 1 LANCET TO CHECK GLUCOSE ONCE DAILY    . pantoprazole (PROTONIX) 40 MG tablet Take 1 tablet (40 mg total) by mouth every morning. 90 tablet 1   No current facility-administered medications for this visit.     Physical Exam  Height _0  (1.626 m), weight 246 lb (111.6 kg), last menstrual period 04/05/2016.  Constitutional: overall normal hygiene, normal nutrition, well developed, normal grooming, normal body habitus. Assistive device:post op shoe  Musculoskeletal: gait and station Limp right, muscle tone and strength are normal, no tremors or atrophy is present.  .  Neurological: coordination overall normal.  Deep tendon reflex/nerve stretch intact.  Sensation normal.  Cranial nerves II-XII intact.   Skin:   Normal overall no scars, lesions, ulcers or rashes. No psoriasis.  Psychiatric: Alert and oriented x 3.  Recent memory intact, remote memory unclear.  Normal mood and affect. Well groomed.  Good eye contact.  Cardiovascular: overall no swelling, no varicosities, no edema bilaterally, normal temperatures of the legs and arms, no clubbing, cyanosis and good capillary refill.  Lymphatic: palpation is normal.  Right second toe with swelling and ecchymosis.  NV intact.  Some foot dorsal swelling.  Limp right.  All other systems reviewed and are negative   The patient has been educated about the nature of the problem(s) and counseled on treatment options.  The patient appeared to understand what I have discussed and is in agreement with it.  Encounter Diagnoses  Name Primary?  . Closed displaced fracture of middle phalanx of lesser toe of right foot, initial encounter Yes  . Body mass index 40.0-44.9, adult ([place])   . Morbid obesity ([place])     PLAN Call if any problems.  Precautions discussed.  Continue  current medications.   Return to clinic 2 weeks   X-rays on return.  We have buddy taped the toes and explained about using the tape and gauze.  Do not get it wet.  Electronically Signed Sanjuana Kava, MD 5/25/20213:33 PM

## 2020-04-13 ENCOUNTER — Telehealth: Payer: Self-pay | Admitting: Orthopaedic Surgery

## 2020-04-13 MED ORDER — HYDROCODONE-ACETAMINOPHEN 5-325 MG PO TABS: ORAL_TABLET | ORAL | 0 refills | Status: DC

## 2020-04-13 NOTE — Telephone Encounter (Signed)
Patient called to relay that her medication was to be refilled, per discussing at her office visit yesterday, 04/12/20: HYDROcodone-acetaminophen (NORCO/VICODIN) 5-325 MG tablet 28 tablet  -[place]

## 2020-04-19 ENCOUNTER — Other Ambulatory Visit: Payer: Self-pay

## 2020-04-19 ENCOUNTER — Ambulatory Visit (INDEPENDENT_AMBULATORY_CARE_PROVIDER_SITE_OTHER): Payer: 59 | Admitting: Family Medicine

## 2020-04-19 ENCOUNTER — Encounter: Payer: Self-pay | Admitting: Family Medicine

## 2020-04-19 VITALS — BP 136/82 | Temp 97.4°F | Ht 64.0 in | Wt 244.0 lb

## 2020-04-19 DIAGNOSIS — N760 Acute vaginitis: Secondary | ICD-10-CM

## 2020-04-19 DIAGNOSIS — R350 Frequency of micturition: Secondary | ICD-10-CM | POA: Diagnosis not present

## 2020-04-19 DIAGNOSIS — B9689 Other specified bacterial agents as the cause of diseases classified elsewhere: Secondary | ICD-10-CM | POA: Diagnosis not present

## 2020-04-19 DIAGNOSIS — R300 Dysuria: Secondary | ICD-10-CM

## 2020-04-19 LAB — POCT URINALYSIS DIPSTICK
Spec Grav, UA: 1.02 (ref 1.010–1.025)
pH, UA: 5 (ref 5.0–8.0)

## 2020-04-19 MED ORDER — CLINDAMYCIN PHOSPHATE 2 % VA CREA: 1.0000 | TOPICAL_CREAM | Freq: Every day | VAGINAL | 0 refills | Status: AC

## 2020-04-19 NOTE — Progress Notes (Signed)
Patient ID: [name], female    DOB: 1966/05/15, 54 y.o.   MRN: [id number]   Chief Complaint  Patient presents with  . Urinary Tract Infection    Last week began to have frequency. last couple of days have had feelings of urgency. Pt states she has been off of soft drinks for about a year but did drink a Dr.Pepper and is not sure if that had anything to do with it.    Subjective:    HPI Pt seen for concern of frequent urination in the past week. Pt not having pain with urination.  No blood in urine. No h/o recurrent utis.  No fever.  Has h/o low back pain.  Has been drinking more sodas and caffeine recently.  Has some concern of having a possible vaginal infection.  She had recent intercourse with her husband and he is uncircumcised and feels that this may be bringing on a vaginal infection some times.  No pain with intercourse or increased vaginal discharge.  Last pap smear was 2 yrs. ago per pt. Not concerned of STD exposure.   Medical History [name] has a past medical history of Anxiety, Blood transfusion without reported diagnosis (1999), Breast cancer ([place]) (05/22/2011), DDD (degenerative disc disease), lumbar, Depression (06/22/2011), Diverticulitis, DM (diabetes mellitus) ([place]) (06/22/2011), Genital warts, GERD (gastroesophageal reflux disease) (06/22/2011), adenomatous colonic polyps (10/2007), Hypercholesterolemia (06/22/2011), Hypertension (06/22/2011), 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[place]) (05/22/2011), Iron deficiency anemia (03/25/2015), Morbid obesity ([place]), Shortness of breath, STD (sexually transmitted disease), and Vaginal bleeding (03/08/2014).   Outpatient Encounter Medications as of 04/19/2020  Medication Sig  . atorvastatin (LIPITOR) 40 MG tablet Take 1 tablet (40 mg total) by mouth at bedtime.  . blood glucose meter kit and supplies KIT Dispense based on patient and insurance preference. TEST ONCE DAILY. DIABETES TYPE 2 ICD 10 CODE E11.9  . blood glucose meter  kit and supplies Dispense based on patient and insurance preference. Use up to four times daily as directed. (FOR ICD-10 E10.9, E11.9).  . citalopram (CELEXA) 40 MG tablet Take 1 tablet (40 mg total) by mouth daily.  . Dulaglutide (TRULICITY) 8.29 HB/7.1IR SOPN Inject 0.75 mg subcu once weekly  . HYDROcodone-acetaminophen (NORCO/VICODIN) 5-325 MG tablet One tablet by mouth every six hours as needed for pain.  Seven day limit  . lisinopril (ZESTRIL) 10 MG tablet Take 1 tablet (10 mg total) by mouth daily.  . metFORMIN (GLUCOPHAGE-XR) 750 MG 24 hr tablet Take 1 tablet (750 mg total) by mouth daily with breakfast.  . Microlet Lancets MISC USE 1 LANCET TO CHECK GLUCOSE ONCE DAILY  . pantoprazole (PROTONIX) 40 MG tablet Take 1 tablet (40 mg total) by mouth every morning.  . clindamycin (CLEOCIN) 2 % vaginal cream Place 1 Applicatorful vaginally at bedtime for 3 days.   No facility-administered encounter medications on file as of 04/19/2020.     Review of Systems  Constitutional: Negative for chills and fever.  HENT: Negative for congestion, rhinorrhea and sore throat.   Respiratory: Negative for cough, shortness of breath and wheezing.   Cardiovascular: Negative for chest pain and leg swelling.  Gastrointestinal: Negative for abdominal pain, diarrhea, nausea and vomiting.  Genitourinary: Positive for frequency and urgency. Negative for difficulty urinating, dysuria, flank pain, hematuria, pelvic pain, vaginal bleeding, vaginal discharge and vaginal pain.  Musculoskeletal: Positive for back pain. Negative for arthralgias.  Skin: Negative for rash.  Neurological: Negative for dizziness, weakness and headaches.     Vitals BP 136/82  Temp (!) 97.4 F (36.3 C)   Ht _0  (1.626 m)   Wt 244 lb (110.7 kg)   LMP 04/05/2016 Comment: spotting 11/2016 and 12/2016  SpO2 96%   BMI 41.88 kg/m   Objective:   Physical Exam Vitals and nursing note reviewed. Exam conducted with a chaperone present.    Constitutional:      General: She is not in acute distress.    Appearance: Normal appearance. She is not ill-appearing.  Cardiovascular:     Rate and Rhythm: Normal rate and regular rhythm.  Pulmonary:     Effort: Pulmonary effort is normal. No respiratory distress.  Abdominal:     General: Abdomen is flat. Bowel sounds are normal. There is no distension.     Palpations: Abdomen is soft. There is no mass.     Tenderness: There is no abdominal tenderness. There is no right CVA tenderness, left CVA tenderness, guarding or rebound.     Hernia: No hernia is present.  Genitourinary:    General: Normal vulva.     Exam position: Lithotomy position.     Pubic Area: No rash.      Labia:        Right: No rash or tenderness.        Left: No rash or tenderness.      Comments: +small white discharge.  No cervical lesions. No CMT.  Skin:    General: Skin is warm and dry.     Findings: No rash.  Neurological:     General: No focal deficit present.     Mental Status: She is alert and oriented to person, place, and time.  Psychiatric:        Mood and Affect: Mood normal.        Behavior: Behavior normal.     Assessment and Plan   1. Bacterial vaginitis - clindamycin (CLEOCIN) 2 % vaginal cream; Place 1 Applicatorful vaginally at bedtime for 3 days.  Dispense: 40 g; Refill: 0 - Chlamydia/Gonococcus/Trichomonas, NAA  2. Dysuria - POCT Urinalysis Dipstick - Urine Culture - Chlamydia/Gonococcus/Trichomonas, NAA  3. Urine frequency - Urine Culture   UA- negative.  Sent for urine culture.  Wet prep showing- clue cells.  BV- Gave clindamycin cream PV for 3 days.  F/u prn.

## 2020-04-21 ENCOUNTER — Encounter: Payer: Self-pay | Admitting: Family Medicine

## 2020-04-21 LAB — CHLAMYDIA/GONOCOCCUS/TRICHOMONAS, NAA
Chlamydia by NAA: NEGATIVE
Gonococcus by NAA: NEGATIVE
Trich vag by NAA: NEGATIVE

## 2020-04-22 ENCOUNTER — Other Ambulatory Visit (HOSPITAL_COMMUNITY): Payer: Self-pay | Admitting: *Deleted

## 2020-04-22 DIAGNOSIS — C50911 Malignant neoplasm of unspecified site of right female breast: Secondary | ICD-10-CM

## 2020-04-22 LAB — URINE CULTURE

## 2020-04-25 ENCOUNTER — Other Ambulatory Visit: Payer: Self-pay

## 2020-04-25 ENCOUNTER — Ambulatory Visit (HOSPITAL_COMMUNITY)
Admission: RE | Admit: 2020-04-25 | Discharge: 2020-04-25 | Disposition: A | Discharge status: Home / Self Care | Payer: 59 | Source: Ambulatory Visit | Attending: Hematology | Admitting: Hematology

## 2020-04-25 ENCOUNTER — Inpatient Hospital Stay (HOSPITAL_COMMUNITY): Payer: 59 | Attending: Hematology

## 2020-04-25 DIAGNOSIS — Z1231 Encounter for screening mammogram for malignant neoplasm of breast: Secondary | ICD-10-CM | POA: Diagnosis not present

## 2020-04-25 DIAGNOSIS — Z9221 Personal history of antineoplastic chemotherapy: Secondary | ICD-10-CM | POA: Diagnosis not present

## 2020-04-25 DIAGNOSIS — Z853 Personal history of malignant neoplasm of breast: Secondary | ICD-10-CM | POA: Insufficient documentation

## 2020-04-25 DIAGNOSIS — K219 Gastro-esophageal reflux disease without esophagitis: Secondary | ICD-10-CM | POA: Insufficient documentation

## 2020-04-25 DIAGNOSIS — C50911 Malignant neoplasm of unspecified site of right female breast: Secondary | ICD-10-CM

## 2020-04-25 DIAGNOSIS — F418 Other specified anxiety disorders: Secondary | ICD-10-CM | POA: Insufficient documentation

## 2020-04-25 DIAGNOSIS — E1140 Type 2 diabetes mellitus with diabetic neuropathy, unspecified: Secondary | ICD-10-CM | POA: Diagnosis not present

## 2020-04-25 DIAGNOSIS — Z171 Estrogen receptor negative status [ER-]: Secondary | ICD-10-CM | POA: Insufficient documentation

## 2020-04-25 DIAGNOSIS — E7800 Pure hypercholesterolemia, unspecified: Secondary | ICD-10-CM | POA: Diagnosis not present

## 2020-04-25 DIAGNOSIS — I10 Essential (primary) hypertension: Secondary | ICD-10-CM | POA: Insufficient documentation

## 2020-04-25 DIAGNOSIS — Z923 Personal history of irradiation: Secondary | ICD-10-CM | POA: Insufficient documentation

## 2020-04-25 DIAGNOSIS — Z79899 Other long term (current) drug therapy: Secondary | ICD-10-CM | POA: Insufficient documentation

## 2020-04-25 DIAGNOSIS — Z7984 Long term (current) use of oral hypoglycemic drugs: Secondary | ICD-10-CM | POA: Diagnosis not present

## 2020-04-25 LAB — CBC WITH DIFFERENTIAL/PLATELET
Abs Immature Granulocytes: 0.01 10*3/uL (ref 0.00–0.07)
Basophils Absolute: 0 10*3/uL (ref 0.0–0.1)
Basophils Relative: 1 %
Eosinophils Absolute: 0.1 10*3/uL (ref 0.0–0.5)
Eosinophils Relative: 2 %
HCT: 41.7 % (ref 36.0–46.0)
Hemoglobin: 13 g/dL (ref 12.0–15.0)
Immature Granulocytes: 0 %
Lymphocytes Relative: 28 %
Lymphs Abs: 2.1 10*3/uL (ref 0.7–4.0)
MCH: 27.8 pg (ref 26.0–34.0)
MCHC: 31.2 g/dL (ref 30.0–36.0)
MCV: 89.3 fL (ref 80.0–100.0)
Monocytes Absolute: 0.4 10*3/uL (ref 0.1–1.0)
Monocytes Relative: 6 %
Neutro Abs: 4.6 10*3/uL (ref 1.7–7.7)
Neutrophils Relative %: 63 %
Platelets: 221 10*3/uL (ref 150–400)
RBC: 4.67 MIL/uL (ref 3.87–5.11)
RDW: 15.4 % (ref 11.5–15.5)
WBC: 7.2 10*3/uL (ref 4.0–10.5)
nRBC: 0 % (ref 0.0–0.2)

## 2020-04-25 LAB — COMPREHENSIVE METABOLIC PANEL
ALT: 18 U/L (ref 0–44)
AST: 18 U/L (ref 15–41)
Albumin: 4.1 g/dL (ref 3.5–5.0)
Alkaline Phosphatase: 55 U/L (ref 38–126)
Anion gap: 10 (ref 5–15)
BUN: 11 mg/dL (ref 6–20)
CO2: 28 mmol/L (ref 22–32)
Calcium: 9 mg/dL (ref 8.9–10.3)
Chloride: 99 mmol/L (ref 98–111)
Creatinine, Ser: 0.91 mg/dL (ref 0.44–1.00)
GFR calc Af Amer: >60 mL/min (ref >60)
GFR calc non Af Amer: >60 mL/min (ref >60)
Glucose, Bld: 101 mg/dL — ABNORMAL HIGH (ref 70–99)
Potassium: 3.8 mmol/L (ref 3.5–5.1)
Sodium: 137 mmol/L (ref 135–145)
Total Bilirubin: 0.6 mg/dL (ref 0.3–1.2)
Total Protein: 7.8 g/dL (ref 6.5–8.1)

## 2020-04-25 LAB — VITAMIN D 25 HYDROXY (VIT D DEFICIENCY, FRACTURES): Vit D, 25-Hydroxy: 17.3 ng/mL — ABNORMAL LOW (ref 30–100)

## 2020-04-26 ENCOUNTER — Encounter: Payer: Self-pay | Admitting: Orthopaedic Surgery

## 2020-04-26 ENCOUNTER — Ambulatory Visit (INDEPENDENT_AMBULATORY_CARE_PROVIDER_SITE_OTHER): Payer: 59 | Admitting: Orthopaedic Surgery

## 2020-04-26 ENCOUNTER — Ambulatory Visit: Payer: 59

## 2020-04-26 DIAGNOSIS — S92521D Displaced fracture of medial phalanx of right lesser toe(s), subsequent encounter for fracture with routine healing: Secondary | ICD-10-CM

## 2020-04-26 MED ORDER — HYDROCODONE-ACETAMINOPHEN 5-325 MG PO TABS: ORAL_TABLET | ORAL | 0 refills | Status: DC

## 2020-04-26 NOTE — Progress Notes (Signed)
My toe is tender  She is using the post op shoe.  She has no new trauma or redness.  NV intact.  X-rays were done of the right foot, reported separately.  Encounter Diagnosis  Name Primary?   Closed displaced fracture of middle phalanx of lesser toe of right foot with routine healing, subsequent encounter Yes   Continue post op shoe  Return in three weeks  X-rays of the right foot  Call if any problem.  Precautions discussed.   Electronically Signed Sanjuana Kava, MD 6/8/20211:45 PM

## 2020-04-26 NOTE — Patient Instructions (Signed)
3

## 2020-04-28 ENCOUNTER — Other Ambulatory Visit (HOSPITAL_COMMUNITY): Payer: Self-pay | Admitting: Hematology

## 2020-04-28 ENCOUNTER — Inpatient Hospital Stay (HOSPITAL_BASED_OUTPATIENT_CLINIC_OR_DEPARTMENT_OTHER): Payer: 59 | Admitting: Hematology

## 2020-04-28 ENCOUNTER — Other Ambulatory Visit: Payer: Self-pay

## 2020-04-28 VITALS — BP 119/69 | HR 79 | Temp 97.1°F | Resp 18 | Wt 244.3 lb

## 2020-04-28 DIAGNOSIS — C50911 Malignant neoplasm of unspecified site of right female breast: Secondary | ICD-10-CM

## 2020-04-28 DIAGNOSIS — Z1231 Encounter for screening mammogram for malignant neoplasm of breast: Secondary | ICD-10-CM | POA: Diagnosis not present

## 2020-04-28 DIAGNOSIS — Z853 Personal history of malignant neoplasm of breast: Secondary | ICD-10-CM | POA: Diagnosis not present

## 2020-04-28 MED ORDER — ERGOCALCIFEROL 1.25 MG (50000 UT) PO CAPS
50000.0000 [IU] | ORAL_CAPSULE | ORAL | 12 refills | Status: DC
Start: 2020-04-28 — End: 2021-02-06

## 2020-04-28 NOTE — Patient Instructions (Addendum)
[place] at [facility] Discharge Instructions  You were seen today by Dr. Delton Coombes. He went over your recent results. You will be prescribed a weekly dose of vitamin D. A mammogram will be scheduled. Dr. Delton Coombes will see you back in 1 year for labs and follow up.   Thank you for choosing [place] at [facility] to provide your oncology and hematology care.  To afford each patient quality time with our provider, please arrive at least 15 minutes before your scheduled appointment time.   If you have a lab appointment with the [facility] please come in thru the Main Entrance and check in at the main information desk  You need to re-schedule your appointment should you arrive 10 or more minutes late.  We strive to give you quality time with our providers, and arriving late affects you and other patients whose appointments are after yours.  Also, if you no show three or more times for appointments you may be dismissed from the clinic at the providers discretion.     Again, thank you for choosing [facility].  Our hope is that these requests will decrease the amount of time that you wait before being seen by our physicians.       _____________________________________________________________  Should you have questions after your visit to [facility], please contact our office at [phone] between the hours of 8:00 a.m. and 4:30 p.m.  Voicemails left after 4:00 p.m. will not be returned until the following business day.  For prescription refill requests, have your pharmacy contact our office and allow 72 hours.    Cancer Center Support Programs:   > Cancer Support Group  2nd Tuesday of the month 1pm-2pm, Journey Room

## 2020-04-28 NOTE — Progress Notes (Signed)
[place], [place] [address]   CLINIC:  Medical Oncology/Hematology  PCP:  Erven Colla, DO [address] / [place] [address] [phone]   REASON FOR VISIT:  Follow-up for right breast cancer  CURRENT THERAPY: Observation  BRIEF ONCOLOGIC HISTORY:  Oncology History  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[place])  02/28/2011 Initial Diagnosis   Right needle core biopsy demonstrating Invasive ductal carcinoma of right breast   03/21/2011 Surgery   Right lumpectomy demonstrating a 3.8 cm invasive ductal carcinoma, grade III, no LVI, and 0/1 lymph nodes   05/11/2011 - 07/13/2011 Chemotherapy   AC x 4   08/03/2011 - 10/19/2011 Chemotherapy   Paclitaxel weekly x 12   11/07/2011 - 12/31/2011 Radiation Therapy     02/10/2015 Procedure   Hysteroscopy with dilation and curettage by Dr. Josefa Half.   02/10/2015 Pathology Results   Diagnosis Endometrium, curettage - ABUNDANT BLOOD AND DEGENERATIVE SECRETORY ENDOMETRIUM WITH STROMAL BREAKDOWN (VERY LIMITED MATERIAL). - INFLAMED SQUAMOUS EPITHELIUM AND ENDOCERVICAL MUCOSA, NO DYSPLASIA OR MALIGNANCY.     CANCER STAGING: Cancer Staging 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HCC) Staging form: Breast, AJCC 7th Edition - Clinical: Stage IIA (T2, N0, cM0) - Unsigned   INTERVAL HISTORY:  [name], a 54 y.o. female, returns for routine follow-up of her right breast cancer. [name] was last seen on 05/13/2019.  Today she reports that she stopped taking the gabapentin as it was not helping the numbness and she stopped taking the tamoxifen.   REVIEW OF SYSTEMS:  Review of Systems  Constitutional: Positive for appetite change (moderately decreased) and fatigue (severe).  Musculoskeletal: Positive for arthralgias (6/10 R toe).  Neurological: Positive for dizziness, headaches and numbness.  All other systems reviewed and are negative.   PAST MEDICAL/SURGICAL HISTORY:  Past  Medical History:  Diagnosis Date  . Anxiety   . Blood transfusion without reported diagnosis 1999   due to heavy menses  . Breast cancer ([place]) 05/22/2011   03/01/11, Stage 2, s/p lumpectomy, chemo/xrt  . DDD (degenerative disc disease), lumbar   . Depression 06/22/2011  . Diverticulitis   . DM (diabetes mellitus) ([place]) 06/22/2011  . Genital warts   . GERD (gastroesophageal reflux disease) 06/22/2011  . Hx of adenomatous colonic polyps 10/2007   65m sigmoid tubular adenoma, FH colon cancer, mother in mid-50s  . Hypercholesterolemia 06/22/2011  . Hypertension 06/22/2011  . 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[place] 05/22/2011  . Iron deficiency anemia 03/25/2015  . Morbid obesity ([place]   . Shortness of breath   . 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 Vaginal bleeding 03/08/2014   Past Surgical History:  Procedure Laterality Date  . back surg x2    . BREAST LUMPECTOMY Right 2012  . CHOLECYSTECTOMY    . COLONOSCOPY  11/03/07   4-mm sessile polyp removed/small internal hemorrhoids/tubular adenoma, random colon bx negative for microscopic colitis  . COLONOSCOPY WITH PROPOFOL N/A 09/17/2016   Grade 2 hemorrhoids, colonic diverticulosis, ascending colon polyp (sessile serrated adenoma), 5 year surveillance  . COLONOSCOPY, ESOPHAGOGASTRODUODENOSCOPY (EGD) AND ESOPHAGEAL DILATION  01/2012   mild gastritis s/p biopsy. Empiric dilation. Normal duodenum.   .Marland KitchenDILATATION & CURETTAGE/HYSTEROSCOPY WITH MYOSURE N/A 02/10/2015   Procedure: DILATATION & CURETTAGE/HYSTEROSCOPY WITH MYOSURE;  Surgeon: BNunzio Cobbs MD;  [facility];  Service: Gynecology;  Laterality: N/A;  . ESOPHAGOGASTRODUODENOSCOPY (EGD) WITH PROPOFOL N/A 01/11/2020  Procedure: ESOPHAGOGASTRODUODENOSCOPY (EGD) WITH PROPOFOL;  Surgeon: Daneil Dolin, MD;  [facility];  Service: Endoscopy;  Laterality: N/A;  10:30am-office rescheduled 2/22 @ 8:30am  . MALONEY DILATION N/A  01/11/2020   Procedure: [name] DILATION;  Surgeon: Daneil Dolin, MD;  [facility];  Service: Endoscopy;  Laterality: N/A;  . MM BREAST STEREO BX*L*R/S     rt.  Marland Kitchen POLYPECTOMY  09/17/2016   Procedure: POLYPECTOMY;  Surgeon: Daneil Dolin, MD;  [facility];  Service: Endoscopy;;  ascending colon  . PORT-A-CATH REMOVAL  05/26/2012   Procedure: REMOVAL PORT-A-CATH;  Surgeon: Jamesetta So, MD;  Location: [facility] ORS;  Service: General;  Laterality: N/A;  Minor Room  . PORTACATH PLACEMENT      SOCIAL HISTORY:  Social History   Socioeconomic History  . Marital status: Married    Spouse name: Not on file  . Number of children: 3  . Years of education: Not on file  . Highest education level: Not on file  Occupational History  . Occupation: child care    Employer: [employer]  Tobacco Use  . Smoking status: Never Smoker  . Smokeless tobacco: Never Used  Vaping Use  . Vaping Use: Never used  Substance and Sexual Activity  . Alcohol use: No    Alcohol/week: 0.0 standard drinks  . Drug use: No  . Sexual activity: Yes    Partners: Male    Birth control/protection: None  Other Topics Concern  . Not on file  Social History Narrative  . Not on file   Social Determinants of Health   Financial Resource Strain:   . Difficulty of Paying Living Expenses:   Food Insecurity:   . Worried About Charity fundraiser in the Last Year:   . Arboriculturist in the Last Year:   Transportation Needs:   . Film/video editor (Medical):   Marland Kitchen Lack of Transportation (Non-Medical):   Physical Activity:   . Days of Exercise per Week:   . Minutes of Exercise per Session:   Stress:   . Feeling of Stress :   Social Connections:   . Frequency of Communication with Friends and Family:   . Frequency of Social Gatherings with Friends and Family:   . Attends Religious Services:   . Active Member of Clubs or Organizations:   . Attends Archivist Meetings:   Marland Kitchen  Marital Status:   Intimate Partner Violence:   . Fear of Current or Ex-Partner:   . Emotionally Abused:   Marland Kitchen Physically Abused:   . Sexually Abused:     FAMILY HISTORY:  Family History  Problem Relation Age of Onset  . Colon cancer Mother        mid-50s, died of metastatic disease  . Cancer Mother        liver  . Coronary artery disease Mother   . Diabetes type I Mother   . Peripheral vascular disease Mother        Carotid disease in her 41s  . Coronary artery disease Father        CAD in his 69s  . Diabetes Father   . Diabetes type I Father   . Hypertension Father   . Bipolar disorder Sister   . Coronary artery disease Brother        MI in his 57s  . Hypertension Brother   . Hypertension Maternal Grandmother   . Hyperlipidemia Maternal Grandmother   .  Stroke Maternal Grandmother   . Hypertension Maternal Grandfather   . Diabetes Maternal Grandfather   . Diabetes Paternal Grandmother   . Heart disease Paternal Grandfather     CURRENT MEDICATIONS:  Current Outpatient Medications  Medication Sig Dispense Refill  . atorvastatin (LIPITOR) 40 MG tablet Take 1 tablet (40 mg total) by mouth at bedtime. 90 tablet 1  . blood glucose meter kit and supplies KIT Dispense based on patient and insurance preference. TEST ONCE DAILY. DIABETES TYPE 2 ICD 10 CODE E11.9 1 each 5  . blood glucose meter kit and supplies Dispense based on patient and insurance preference. Use up to four times daily as directed. (FOR ICD-10 E10.9, E11.9). 1 each 5  . citalopram (CELEXA) 40 MG tablet Take 1 tablet (40 mg total) by mouth daily. 90 tablet 1  . Dulaglutide (TRULICITY) 5.63 OV/5.6EP SOPN Inject 0.75 mg subcu once weekly 4 pen 5  . HYDROcodone-acetaminophen (NORCO/VICODIN) 5-325 MG tablet One tablet by mouth every six hours as needed for pain.  Seven day limit 25 tablet 0  . lisinopril (ZESTRIL) 10 MG tablet Take 1 tablet (10 mg total) by mouth daily. 90 tablet 1  . metFORMIN (GLUCOPHAGE-XR) 750 MG  24 hr tablet Take 1 tablet (750 mg total) by mouth daily with breakfast. 90 tablet 1  . Microlet Lancets MISC USE 1 LANCET TO CHECK GLUCOSE ONCE DAILY    . pantoprazole (PROTONIX) 40 MG tablet Take 1 tablet (40 mg total) by mouth every morning. 90 tablet 1   No current facility-administered medications for this visit.    ALLERGIES:  Allergies  Allergen Reactions  . Metformin And Related Diarrhea  . Flagyl [Metronidazole] Hives and Itching  . Lansoprazole   . Pravastatin Other (See Comments)    Muscle aches    PHYSICAL EXAM:  Performance status (ECOG): 1 - Symptomatic but completely ambulatory  There were no vitals filed for this visit. Wt Readings from Last 3 Encounters:  04/19/20 244 lb (110.7 kg)  04/12/20 246 lb (111.6 kg)  03/22/20 240 lb (108.9 kg)   Physical Exam Vitals reviewed.  Constitutional:      Appearance: Normal appearance. She is obese.  Cardiovascular:     Rate and Rhythm: Normal rate and regular rhythm.     Pulses: Normal pulses.     Heart sounds: Normal heart sounds.  Pulmonary:     Effort: Pulmonary effort is normal.     Breath sounds: Normal breath sounds.  Chest:     Breasts:        Right: Tenderness present.   Abdominal:     Palpations: Abdomen is soft. There is no mass.     Tenderness: There is no abdominal tenderness.  Lymphadenopathy:     Upper Body:     Right upper body: No supraclavicular or axillary adenopathy.     Left upper body: No supraclavicular or axillary adenopathy.  Neurological:     General: No focal deficit present.     Mental Status: She is alert and oriented to person, place, and time.  Psychiatric:        Mood and Affect: Mood normal.        Behavior: Behavior normal.   Right breast lumpectomy is unchanged with no palpable masses.  LABORATORY DATA:  I have reviewed the labs as listed.  CBC Latest Ref Rng & Units 04/25/2020 12/23/2019 05/06/2019  WBC 4.0 - 10.5 K/uL 7.2 7.3 7.3  Hemoglobin 12.0 - 15.0 g/dL 13.0 13.3 12.6  Hematocrit 36 - 46 % 41.7 41.0 41.6  Platelets 150 - 400 K/uL 221 - 224   CMP Latest Ref Rng & Units 04/25/2020 04/05/2020 12/23/2019  Glucose 70 - 99 mg/dL 101(H) - 95  BUN 6 - 20 mg/dL 11 - 12  Creatinine 0.44 - 1.00 mg/dL 0.91 - 0.96  Sodium 135 - 145 mmol/L 137 - 144  Potassium 3.5 - 5.1 mmol/L 3.8 - 4.1  Chloride 98 - 111 mmol/L 99 - 103  CO2 22 - 32 mmol/L 28 - 24  Calcium 8.9 - 10.3 mg/dL 9.0 - 9.3  Total Protein 6.5 - 8.1 g/dL 7.8 7.0 7.2  Total Bilirubin 0.3 - 1.2 mg/dL 0.6 0.3 0.3  Alkaline Phos 38 - 126 U/L 55 75 70  AST 15 - 41 U/L _0 ALT 0 - 44 U/L _1 DIAGNOSTIC IMAGING:  I have independently reviewed the scans and discussed with the patient. DG Foot Complete Right  Result Date: 04/09/2020 CLINICAL DATA:  Dorsal foot and 2nd toe pain after injury last night. EXAM: RIGHT FOOT COMPLETE - 3+ VIEW COMPARISON:  Radiographs 04/10/2017. FINDINGS: There is a minimally displaced fracture through the head of the 2nd proximal phalanx. This demonstrates no definite intra-articular extension. No other evidence of acute fracture or dislocation. Stable mild midfoot degenerative changes and chronic spurring along the base of the 5th metatarsal. Small calcaneal spurs are noted. IMPRESSION: Minimally displaced fracture through the head of the 2nd proximal phalanx. Electronically Signed   By: Richardean Sale M.D.   On: 04/09/2020 13:40   DG Toe 2nd Right  Result Date: 04/26/2020 Clinical:  History of fracture of second toe right X-rays were done of the right second toe, three views. There is a healing fracture of the proximal phalanx distally of the second toe on the right.  Bone quality is good. Impression: healing fracture of the proximal phalanx of second toe right. Electronically Signed Sanjuana Kava, MD 6/8/20211:42 PM   MM 3D SCREEN BREAST BILATERAL  Result Date: 04/26/2020 CLINICAL DATA:  Screening. History of RIGHT breast cancer and lumpectomy in 2012. EXAM: DIGITAL  SCREENING BILATERAL MAMMOGRAM WITH TOMO AND CAD COMPARISON:  Previous exam(s). ACR Breast Density Category b: There are scattered areas of fibroglandular density. FINDINGS: There are no findings suspicious for malignancy. RIGHT lumpectomy changes are again noted. Images were processed with CAD. IMPRESSION: No mammographic evidence of malignancy. A result letter of this screening mammogram will be mailed directly to the patient. RECOMMENDATION: Screening mammogram in one year. (Code:SM-B-01Y) BI-RADS CATEGORY  2: Benign. Electronically Signed   By: Margarette Canada M.D.   On: 04/26/2020 15:49     ASSESSMENT:  1.  Stage II (T2N0) right breast IDC: -Diagnosed in April 2012, ER negative, PR 2% positive, Ki-67 90%, HER-2 negative. -Status post lumpectomy followed by adjuvant chemotherapy with AC x4 followed by 12 weekly paclitaxel completed on 10/19/2011. -Adjuvant breast radiation completed on 12/31/2011. -Tamoxifen was likely not recommended because of low positivity for PR. -Mammogram on 04/25/2020 was BI-RADS 2 category.   PLAN:  1.  Stage II (T2N0) right breast IDC: -I reviewed mammogram results.  Physical exam did not reveal any abnormalities.  Labs were normal. -We will see her back in 1 year for follow-up with mammogram and physical exam  2.  Neuropathy: -She used gabapentin but did not have any benefit.  Reports neuropathy with numbness.  No pains reported.  3.  Vitamin D deficiency: -Vitamin D today 17.3. -  We will send vitamin D 50,000 units weekly.   Orders placed this encounter:  No orders of the defined types were placed in this encounter.    Derek Jack, MD [place] [phone]   I, Milinda Antis, am acting as a scribe for Dr. Sanda Linger.  I, Derek Jack MD, have reviewed the above documentation for accuracy and completeness, and I agree with the above.

## 2020-05-07 ENCOUNTER — Encounter (HOSPITAL_COMMUNITY): Payer: Self-pay | Admitting: Emergency Medicine

## 2020-05-07 ENCOUNTER — Other Ambulatory Visit: Payer: Self-pay

## 2020-05-07 ENCOUNTER — Emergency Department (HOSPITAL_COMMUNITY): Payer: 59

## 2020-05-07 ENCOUNTER — Emergency Department (HOSPITAL_COMMUNITY)
Admission: EM | Admit: 2020-05-07 | Discharge: 2020-05-07 | Disposition: A | Discharge status: Home / Self Care | Payer: 59 | Attending: Emergency Medicine | Admitting: Emergency Medicine

## 2020-05-07 DIAGNOSIS — I10 Essential (primary) hypertension: Secondary | ICD-10-CM | POA: Diagnosis not present

## 2020-05-07 DIAGNOSIS — E119 Type 2 diabetes mellitus without complications: Secondary | ICD-10-CM | POA: Insufficient documentation

## 2020-05-07 DIAGNOSIS — Z79899 Other long term (current) drug therapy: Secondary | ICD-10-CM | POA: Diagnosis not present

## 2020-05-07 DIAGNOSIS — Z888 Allergy status to other drugs, medicaments and biological substances status: Secondary | ICD-10-CM | POA: Insufficient documentation

## 2020-05-07 DIAGNOSIS — Z853 Personal history of malignant neoplasm of breast: Secondary | ICD-10-CM | POA: Diagnosis not present

## 2020-05-07 DIAGNOSIS — Z881 Allergy status to other antibiotic agents status: Secondary | ICD-10-CM | POA: Diagnosis not present

## 2020-05-07 DIAGNOSIS — Z7984 Long term (current) use of oral hypoglycemic drugs: Secondary | ICD-10-CM | POA: Diagnosis not present

## 2020-05-07 DIAGNOSIS — R109 Unspecified abdominal pain: Secondary | ICD-10-CM | POA: Diagnosis present

## 2020-05-07 DIAGNOSIS — N12 Tubulo-interstitial nephritis, not specified as acute or chronic: Secondary | ICD-10-CM | POA: Insufficient documentation

## 2020-05-07 LAB — COMPREHENSIVE METABOLIC PANEL
ALT: 17 U/L (ref 0–44)
AST: 16 U/L (ref 15–41)
Albumin: 4.1 g/dL (ref 3.5–5.0)
Alkaline Phosphatase: 60 U/L (ref 38–126)
Anion gap: 11 (ref 5–15)
BUN: 10 mg/dL (ref 6–20)
CO2: 26 mmol/L (ref 22–32)
Calcium: 8.8 mg/dL — ABNORMAL LOW (ref 8.9–10.3)
Chloride: 101 mmol/L (ref 98–111)
Creatinine, Ser: 0.8 mg/dL (ref 0.44–1.00)
GFR calc Af Amer: >60 mL/min (ref >60)
GFR calc non Af Amer: >60 mL/min (ref >60)
Glucose, Bld: 101 mg/dL — ABNORMAL HIGH (ref 70–99)
Potassium: 3.7 mmol/L (ref 3.5–5.1)
Sodium: 138 mmol/L (ref 135–145)
Total Bilirubin: 0.8 mg/dL (ref 0.3–1.2)
Total Protein: 7.7 g/dL (ref 6.5–8.1)

## 2020-05-07 LAB — CBC WITH DIFFERENTIAL/PLATELET
Abs Immature Granulocytes: 0.03 10*3/uL (ref 0.00–0.07)
Basophils Absolute: 0 10*3/uL (ref 0.0–0.1)
Basophils Relative: 1 %
Eosinophils Absolute: 0.1 10*3/uL (ref 0.0–0.5)
Eosinophils Relative: 1 %
HCT: 41.7 % (ref 36.0–46.0)
Hemoglobin: 13.1 g/dL (ref 12.0–15.0)
Immature Granulocytes: 0 %
Lymphocytes Relative: 22 %
Lymphs Abs: 1.9 10*3/uL (ref 0.7–4.0)
MCH: 28 pg (ref 26.0–34.0)
MCHC: 31.4 g/dL (ref 30.0–36.0)
MCV: 89.1 fL (ref 80.0–100.0)
Monocytes Absolute: 0.6 10*3/uL (ref 0.1–1.0)
Monocytes Relative: 7 %
Neutro Abs: 6 10*3/uL (ref 1.7–7.7)
Neutrophils Relative %: 69 %
Platelets: 211 10*3/uL (ref 150–400)
RBC: 4.68 MIL/uL (ref 3.87–5.11)
RDW: 15.6 % — ABNORMAL HIGH (ref 11.5–15.5)
WBC: 8.7 10*3/uL (ref 4.0–10.5)
nRBC: 0 % (ref 0.0–0.2)

## 2020-05-07 LAB — URINALYSIS, ROUTINE W REFLEX MICROSCOPIC
Bilirubin Urine: NEGATIVE
Glucose, UA: NEGATIVE mg/dL
Ketones, ur: NEGATIVE mg/dL
Nitrite: NEGATIVE
Protein, ur: NEGATIVE mg/dL
Specific Gravity, Urine: 1.027 (ref 1.005–1.030)
WBC, UA: >50 WBC/hpf — ABNORMAL HIGH (ref 0–5)
pH: 6 (ref 5.0–8.0)

## 2020-05-07 MED ORDER — KETOROLAC TROMETHAMINE 30 MG/ML IJ SOLN
30.0000 mg | Freq: Once | INTRAMUSCULAR | Status: AC
  Administered 2020-05-07: 30 mg via INTRAVENOUS
  Filled 2020-05-07: qty 1

## 2020-05-07 MED ORDER — IOHEXOL 300 MG/ML  SOLN
100.0000 mL | Freq: Once | INTRAMUSCULAR | Status: AC | PRN
  Administered 2020-05-07: 100 mL via INTRAVENOUS

## 2020-05-07 MED ORDER — CEPHALEXIN 500 MG PO CAPS
500.0000 mg | ORAL_CAPSULE | Freq: Three times a day (TID) | ORAL | 0 refills | Status: AC
Start: 2020-05-07 — End: 2020-05-14

## 2020-05-07 MED ORDER — SODIUM CHLORIDE 0.9 % IV BOLUS
1000.0000 mL | Freq: Once | INTRAVENOUS | Status: AC
  Administered 2020-05-07: 1000 mL via INTRAVENOUS

## 2020-05-07 MED ORDER — ONDANSETRON 4 MG PO TBDP
4.0000 mg | ORAL_TABLET | Freq: Three times a day (TID) | ORAL | 0 refills | Status: DC | PRN
Start: 2020-05-07 — End: 2020-10-31

## 2020-05-07 MED ORDER — SODIUM CHLORIDE 0.9 % IV SOLN
1.0000 g | Freq: Once | INTRAVENOUS | Status: AC
  Administered 2020-05-07: 1 g via INTRAVENOUS
  Filled 2020-05-07: qty 10

## 2020-05-07 MED ORDER — ONDANSETRON HCL 4 MG/2ML IJ SOLN
4.0000 mg | Freq: Once | INTRAMUSCULAR | Status: AC
  Administered 2020-05-07: 4 mg via INTRAVENOUS
  Filled 2020-05-07: qty 2

## 2020-05-07 MED ORDER — MORPHINE SULFATE (PF) 4 MG/ML IV SOLN
4.0000 mg | Freq: Once | INTRAVENOUS | Status: AC
  Administered 2020-05-07: 4 mg via INTRAVENOUS
  Filled 2020-05-07: qty 1

## 2020-05-07 NOTE — ED Provider Notes (Signed)
[facility] Provider Note   CSN: [id number] Arrival date & time: 05/07/20  1541    History Chief Complaint  Patient presents with  . Abdominal Pain    [name] is a 54 y.o. female with past history significant for diverticulitis, diabetes, breast cancer in remission who presents for evaluation of abdominal pain and flank pain.  Patient with 2-day history of left side abdominal pain and left flank pain.  Admits to dysuria without hematuria.  Unknown prior history of kidney stones.  Multiple episodes of NBNB emesis.  Has not been able to take her home meds due to her emesis.  Decreased p.o. intake at home due to emesis.  Feels like she only goes small quantities of urine at a time.  Decreased stream.  Denies fever, lightheadedness, dizziness, chest pain, shortness of breath, diarrhea, melena, bright red blood per rectum, lateral leg swelling, redness, warmth, rashes, lesions.  Has not taken thing for symptoms.  She rates her current pain an 8/10.  No known prior history of aneurysm, dissection.  Denies additional aggravating or relieving factors.  History obtained from patient past medical records.  No interpreter used.  HPI     Past Medical History:  Diagnosis Date  . Anxiety   . Blood transfusion without reported diagnosis 1999   due to heavy menses  . Breast cancer ([place]) 05/22/2011   03/01/11, Stage 2, s/p lumpectomy, chemo/xrt  . DDD (degenerative disc disease), lumbar   . Depression 06/22/2011  . Diverticulitis   . DM (diabetes mellitus) ([place]) 06/22/2011  . Genital warts   . GERD (gastroesophageal reflux disease) 06/22/2011  . Hx of adenomatous colonic polyps 10/2007   84m sigmoid tubular adenoma, FH colon cancer, mother in mid-50s  . Hypercholesterolemia 06/22/2011  . Hypertension 06/22/2011  . 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[place] 05/22/2011  . Iron deficiency anemia 03/25/2015  . Morbid obesity ([facility]   . Shortness of breath   . 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 Vaginal bleeding 03/08/2014    Patient Active Problem List   Diagnosis Date Noted  . Neuropathy due to drug ([facility] 02/25/2020  . Reactive airways dysfunction syndrome ([facility] 02/25/2020  . Abdominal pain 12/22/2019  . Central adiposity 04/12/2018  . Seasonal affective disorder ([facility] 12/15/2017  . Chest pain 02/27/2017  . 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 Panic disorder 01/09/2017  . Rectal bleeding 09/06/2016  . Abdominal cramping 09/06/2016  . Fatty liver 09/06/2016  . Dehydration 12/15/2015  . Orthostatic dizziness 12/15/2015  . Nausea vomiting and diarrhea 12/15/2015  . Iron deficiency anemia 03/25/2015  . Excessive or frequent menstruation 04/08/2014  . Dysmenorrhea 04/08/2014  . H/O adenomatous polyp of colon 01/24/2012  . FH: colon cancer 01/24/2012  . Esophageal dysphagia 01/24/2012  . Chronic diarrhea 01/24/2012  . Depression 06/22/2011  . GERD (gastroesophageal reflux disease) 06/22/2011  . DM (diabetes mellitus) ([facility] 06/22/2011  . Hypercholesterolemia 06/22/2011  . Hypertension 06/22/2011  . Obesity 06/22/2011  . 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[facility] 05/22/2011    Past Surgical History:  Procedure Laterality Date  . back surg x2    . BREAST LUMPECTOMY Right 2012  . CHOLECYSTECTOMY    . COLONOSCOPY  11/03/07   4-mm sessile polyp removed/small internal hemorrhoids/tubular adenoma, random colon bx negative for microscopic colitis  . COLONOSCOPY WITH PROPOFOL N/A 09/17/2016   Grade 2 hemorrhoids, colonic diverticulosis,  ascending colon polyp (sessile serrated adenoma), 5 year surveillance  . COLONOSCOPY, ESOPHAGOGASTRODUODENOSCOPY (EGD) AND ESOPHAGEAL DILATION  01/2012   mild gastritis s/p biopsy. Empiric dilation. Normal duodenum.   Marland Kitchen DILATATION & CURETTAGE/HYSTEROSCOPY WITH MYOSURE N/A 02/10/2015   Procedure: DILATATION &  CURETTAGE/HYSTEROSCOPY WITH MYOSURE;  Surgeon: Nunzio Cobbs, MD;  Location: [facility];  Service: Gynecology;  Laterality: N/A;  . ESOPHAGOGASTRODUODENOSCOPY (EGD) WITH PROPOFOL N/A 01/11/2020   Procedure: ESOPHAGOGASTRODUODENOSCOPY (EGD) WITH PROPOFOL;  Surgeon: Daneil Dolin, MD;  [facility];  Service: Endoscopy;  Laterality: N/A;  10:30am-office rescheduled 2/22 @ 8:30am  . MALONEY DILATION N/A 01/11/2020   Procedure: Venia Minks DILATION;  Surgeon: Daneil Dolin, MD;  [facility];  Service: Endoscopy;  Laterality: N/A;  . MM BREAST STEREO BX*L*R/S     rt.  Marland Kitchen POLYPECTOMY  09/17/2016   Procedure: POLYPECTOMY;  Surgeon: Daneil Dolin, MD;  [facility]: [facility];  Service: Endoscopy;;  ascending colon  . PORT-A-CATH REMOVAL  05/26/2012   Procedure: REMOVAL PORT-A-CATH;  Surgeon: Jamesetta So, MD;  Location: [facility];  Service: General;  Laterality: N/A;  Minor Room  . PORTACATH PLACEMENT       OB History    Gravida  4   Para  3   Term  0   Preterm  0   AB  1   Living  3     SAB  0   TAB  0   Ectopic  0   Multiple  0   Live Births  3           Family History  Problem Relation Age of Onset  . Colon cancer Mother        mid-50s, died of metastatic disease  . Cancer Mother        liver  . Coronary artery disease Mother   . Diabetes type I Mother   . Peripheral vascular disease Mother        Carotid disease in her 57s  . Coronary artery disease Father        CAD in his 50s  . Diabetes Father   . Diabetes type I Father   . Hypertension Father   . Bipolar disorder Sister   . Coronary artery disease Brother        MI in his 77s  . Hypertension Brother   . Hypertension Maternal Grandmother   . Hyperlipidemia Maternal Grandmother   . Stroke Maternal Grandmother   . Hypertension Maternal Grandfather   . Diabetes Maternal Grandfather   . Diabetes Paternal Grandmother   . Heart disease Paternal Grandfather     Social History    Tobacco Use  . Smoking status: Never Smoker  . Smokeless tobacco: Never Used  Vaping Use  . Vaping Use: Never used  Substance Use Topics  . Alcohol use: No    Alcohol/week: 0.0 standard drinks  . Drug use: No    Home Medications Prior to Admission medications   Medication Sig Start Date End Date Taking? Authorizing Provider  atorvastatin (LIPITOR) 40 MG tablet Take 1 tablet (40 mg total) by mouth at bedtime. 03/28/20  Yes Mikey Kirschner, MD  citalopram (CELEXA) 40 MG tablet Take 1 tablet (40 mg total) by mouth daily. 03/28/20  Yes Mikey Kirschner, MD  Dulaglutide (TRULICITY) 1.82 XH/3.7JI SOPN Inject 0.75 mg subcu once weekly Patient taking differently: Inject 0.75 mg into the skin every Thursday. Inject 0.75 mg subcu once weekly  03/28/20  Yes Mikey Kirschner, MD  ergocalciferol (VITAMIN D2) 1.25 MG (50000 UT) capsule Take 1 capsule (50,000 Units total) by mouth once a week. 04/28/20  Yes Derek Jack, MD  HYDROcodone-acetaminophen (NORCO/VICODIN) 5-325 MG tablet One tablet by mouth every six hours as needed for pain.  Seven day limit Patient taking differently: Take 1 tablet by mouth every 6 (six) hours as needed for moderate pain or severe pain.  04/26/20  Yes Sanjuana Kava, MD  lisinopril (ZESTRIL) 10 MG tablet Take 1 tablet (10 mg total) by mouth daily. 03/28/20  Yes Mikey Kirschner, MD  metFORMIN (GLUCOPHAGE-XR) 750 MG 24 hr tablet Take 1 tablet (750 mg total) by mouth daily with breakfast. 03/28/20  Yes Mikey Kirschner, MD  pantoprazole (PROTONIX) 40 MG tablet Take 1 tablet (40 mg total) by mouth every morning. 03/28/20  Yes Mikey Kirschner, MD  blood glucose meter kit and supplies KIT Dispense based on patient and insurance preference. TEST ONCE DAILY. DIABETES TYPE 2 ICD 10 CODE E11.9 Patient not taking: Reported on 05/07/2020 08/26/18   Cheyenne Adas, NP  blood glucose meter kit and supplies Dispense based on patient and insurance preference. Use up to four times  daily as directed. (FOR ICD-10 E10.9, E11.9). Patient not taking: Reported on 05/07/2020 01/05/20   Mikey Kirschner, MD  cephALEXin (KEFLEX) 500 MG capsule Take 1 capsule (500 mg total) by mouth 3 (three) times daily for 7 days. 05/07/20 05/14/20  Sayge Brienza A, PA-C  ondansetron (ZOFRAN ODT) 4 MG disintegrating tablet Take 1 tablet (4 mg total) by mouth every 8 (eight) hours as needed for nausea or vomiting. 05/07/20   Braidyn Peace A, PA-C    Allergies    Flagyl [metronidazole], Lansoprazole, and Pravastatin  Review of Systems   Review of Systems  Constitutional: Negative.   HENT: Negative.   Respiratory: Negative.   Cardiovascular: Negative.   Gastrointestinal: Positive for abdominal pain, nausea and vomiting. Negative for abdominal distention, anal bleeding, blood in stool, constipation, diarrhea and rectal pain.  Genitourinary: Negative.   Musculoskeletal: Negative.   Skin: Negative.   Neurological: Negative.   All other systems reviewed and are negative.   Physical Exam Updated Vital Signs BP (!) 141/104 (BP Location: Left Arm)   Pulse 96   Temp 98.2 F (36.8 C) (Temporal)   Resp 20   Ht _0  (1.626 m)   Wt 109.9 kg   LMP 04/05/2016 Comment: spotting 11/2016 and 12/2016  SpO2 97%   BMI 41.57 kg/m   Physical Exam Vitals and nursing note reviewed.  Constitutional:      General: She is not in acute distress.    Appearance: She is well-developed. She is not ill-appearing or toxic-appearing.  HENT:     Head: Normocephalic and atraumatic.     Mouth/Throat:     Mouth: Mucous membranes are moist.  Eyes:     Pupils: Pupils are equal, round, and reactive to light.  Cardiovascular:     Rate and Rhythm: Normal rate.     Heart sounds: Normal heart sounds.  Pulmonary:     Effort: Pulmonary effort is normal. No respiratory distress.     Breath sounds: Normal breath sounds.  Abdominal:     General: Bowel sounds are normal. There is no distension.     Palpations:  Abdomen is soft.     Tenderness: There is abdominal tenderness in the left upper quadrant. There is left CVA tenderness. There is no right CVA tenderness,  guarding or rebound. Negative signs include Murphy's sign and McBurney's sign.     Hernia: No hernia is present.  Musculoskeletal:        General: Normal range of motion.     Cervical back: Normal range of motion.  Skin:    General: Skin is warm and dry.     Capillary Refill: Capillary refill takes less than 2 seconds.     Findings: No rash.  Neurological:     General: No focal deficit present.     Mental Status: She is alert.    ED Results / Procedures / Treatments   Labs (all labs ordered are listed, but only abnormal results are displayed) Labs Reviewed  URINALYSIS, ROUTINE W REFLEX MICROSCOPIC - Abnormal; Notable for the following components:      Result Value   Color, Urine STRAW (*)    Hgb urine dipstick MODERATE (*)    Leukocytes,Ua MODERATE (*)    WBC, UA >50 (*)    Bacteria, UA RARE (*)    All other components within normal limits  CBC WITH DIFFERENTIAL/PLATELET - Abnormal; Notable for the following components:   RDW 15.6 (*)    All other components within normal limits  COMPREHENSIVE METABOLIC PANEL - Abnormal; Notable for the following components:   Glucose, Bld 101 (*)    Calcium 8.8 (*)    All other components within normal limits    EKG None  Radiology CT Abdomen Pelvis W Contrast  Result Date: 05/07/2020 CLINICAL DATA:  Left abdominal pain and flank pain. EXAM: CT ABDOMEN AND PELVIS WITH CONTRAST TECHNIQUE: Multidetector CT imaging of the abdomen and pelvis was performed using the standard protocol following bolus administration of intravenous contrast. CONTRAST:  171m OMNIPAQUE IOHEXOL 300 MG/ML  SOLN COMPARISON:  CT abdomen pelvis dated 05/27/2019 FINDINGS: Lower chest: Postsurgical changes are seen in the right breast. The visible portions of the lungs are clear. Hepatobiliary: No focal liver abnormality  is seen. Status post cholecystectomy. No biliary dilatation. Pancreas: Unremarkable. No pancreatic ductal dilatation or surrounding inflammatory changes. Spleen: Normal in size without focal abnormality. Adrenals/Urinary Tract: Adrenal glands are unremarkable. Kidneys are normal, without renal calculi, focal lesion, or hydronephrosis. There is mild circumferential urothelial wall thickening on the left with surrounding fat stranding. Bladder is unremarkable. Stomach/Bowel: Stomach is within normal limits. There is a small duodenal diverticulum. Appendix appears normal. No evidence of bowel wall thickening, distention, or inflammatory changes. Vascular/Lymphatic: Aortic atherosclerosis. No enlarged abdominal or pelvic lymph nodes. Reproductive: Uterus and bilateral adnexa are unremarkable. Other: No abdominal wall hernia or abnormality. No abdominopelvic ascites. Musculoskeletal: Degenerative changes are seen in the spine. IMPRESSION: 1. Mild circumferential urothelial wall thickening on the left with surrounding fat stranding may reflect ascending urinary tract infection. Aortic Atherosclerosis (ICD10-I70.0). Electronically Signed   By: TZerita BoersM.D.   On: 05/07/2020 17:31    Procedures Procedures (including critical care time)  Medications Ordered in ED Medications  sodium chloride 0.9 % bolus 1,000 mL (0 mLs Intravenous Stopped 05/07/20 1843)  ondansetron (ZOFRAN) injection 4 mg (4 mg Intravenous Given 05/07/20 1642)  morphine 4 MG/ML injection 4 mg (4 mg Intravenous Given 05/07/20 1642)  iohexol (OMNIPAQUE) 300 MG/ML solution 100 mL (100 mLs Intravenous Contrast Given 05/07/20 1704)  cefTRIAXone (ROCEPHIN) 1 g in sodium chloride 0.9 % 100 mL IVPB (0 g Intravenous Stopped 05/07/20 1843)  ketorolac (TORADOL) 30 MG/ML injection 30 mg (30 mg Intravenous Given 05/07/20 1908)    ED Course  I have reviewed  the triage vital signs and the nursing notes.  Pertinent labs & imaging results that were  available during my care of the patient were reviewed by me and considered in my medical decision making (see chart for details).  54 year old presents for evaluation of left-sided abdominal pain left flank pain.  Also with urinary symptoms.  No pelvic pain, vaginal discharge or concerns for STDs.  Afebrile, nonseptic, not ill-appearing.  Diffuse tenderness to left upper abdomen and left flank.  No hematuria per patient.  Multiple episodes of NBNB emesis.  No associated chest pain, shortness of breath.  She has no tachycardia, tachypnea or hypoxia.  Low suspicion for atypical PE or ACS.  Plan on labs, imaging, pain control and reassess.   Labs imaging personally viewed interpreted Urinalysis positive for infection CBC without leukocytosis, hemoglobin 19.4 Metabolic panel mild hyperglycemia to 101, noticed electrolyte, renal abnormality CT abdomen and pelvis with ascending urinary tract infection  Patient reassessed.  She is tolerating p.o. intake without difficulty.  Pain controlled.  No emesis since antiemetics.  Given antibiotics for likely pyelonephritis.  Low suspicion for infected stone, sepsis.  Will DC home with antibiotics, antiemetics.  She may return for any worsening symptoms.  Patient is nontoxic, nonseptic appearing, in no apparent distress.  Patient's pain and other symptoms adequately managed in emergency department.  Fluid bolus given.  Labs, imaging and vitals reviewed.  Patient does not meet the SIRS or Sepsis criteria.  On repeat exam patient does not have a surgical abdomin and there are no peritoneal signs.  No indication of appendicitis, bowel obstruction, bowel perforation, cholecystitis, diverticulitis, PID or ectopic pregnancy.  Patient discharged home with symptomatic treatment and given strict instructions for follow-up with their primary care physician.  I have also discussed reasons to return immediately to the ER.  Patient expresses understanding and agrees with plan.      MDM Rules/Calculators/A&P                           Final Clinical Impression(s) / ED Diagnoses Final diagnoses:  Pyelonephritis    Rx / DC Orders ED Discharge Orders         Ordered    Urine Culture     Discontinue  Reprint     05/07/20 1928    cephALEXin (KEFLEX) 500 MG capsule  3 times daily     Discontinue  Reprint     05/07/20 1931    ondansetron (ZOFRAN ODT) 4 MG disintegrating tablet  Every 8 hours PRN     Discontinue  Reprint     05/07/20 1931           Omeka Holben A, PA-C 05/07/20 1933    Milton Ferguson, MD 05/09/20 1045

## 2020-05-07 NOTE — ED Triage Notes (Signed)
N/V/ today x 3   Urination all night long -painful after urination   L abd pain   Pain similar to when she had a UTI last year

## 2020-05-07 NOTE — Discharge Instructions (Signed)
Take the antibiotics and the antinausea medicine as prescribed.  You may take Tylenol ibuprofen as needed for pain.  You are given your first dose of antibiotics here in the emergency department.  Follow-up with primary care provider in 2 to 3 days for an ED follow-up.  If you have any new worsening symptoms please seek reevaluation the emergency department.

## 2020-05-07 NOTE — ED Notes (Signed)
Pt ambulatory to waiting room. Pt verbalized understanding of discharge instructions.   

## 2020-05-10 LAB — URINE CULTURE: Culture: 80000 — AB

## 2020-05-24 ENCOUNTER — Ambulatory Visit: Payer: 59 | Admitting: Orthopaedic Surgery

## 2020-07-12 ENCOUNTER — Ambulatory Visit: Payer: 59 | Admitting: Gastroenterology

## 2020-08-23 ENCOUNTER — Other Ambulatory Visit: Payer: 59

## 2020-08-23 ENCOUNTER — Telehealth: Payer: Self-pay | Admitting: Family Medicine

## 2020-08-23 ENCOUNTER — Other Ambulatory Visit: Payer: Self-pay

## 2020-08-23 ENCOUNTER — Other Ambulatory Visit (INDEPENDENT_AMBULATORY_CARE_PROVIDER_SITE_OTHER): Payer: 59

## 2020-08-23 DIAGNOSIS — Z111 Encounter for screening for respiratory tuberculosis: Secondary | ICD-10-CM

## 2020-08-23 DIAGNOSIS — Z23 Encounter for immunization: Secondary | ICD-10-CM

## 2020-08-23 NOTE — Telephone Encounter (Signed)
Pt in office for TB test and flu shot today. Pt is wanting to know if she needs a pneumonia shot. Pt has Pneumonia 13 shot in November 2019. Please advise. Thank you.

## 2020-08-23 NOTE — Telephone Encounter (Signed)
Doesn't need prevnar 23 until she is 54 yrs old.   She's up to date so far.   Thx.   Dr. Lovena Le

## 2020-08-23 NOTE — Telephone Encounter (Signed)
Left detailed message (per DPR) on voicemail

## 2020-08-25 ENCOUNTER — Other Ambulatory Visit: Payer: Self-pay | Admitting: Family Medicine

## 2020-08-25 LAB — TB SKIN TEST
Induration: 0 mm
TB Skin Test: NEGATIVE

## 2020-08-25 MED ORDER — SHINGRIX 50 MCG/0.5ML IM SUSR: 0.5000 mL | Freq: Once | INTRAMUSCULAR | 1 refills | Status: AC

## 2020-09-05 ENCOUNTER — Telehealth: Payer: Self-pay

## 2020-09-05 MED ORDER — LORAZEPAM 0.5 MG PO TABS
0.5000 mg | ORAL_TABLET | Freq: Two times a day (BID) | ORAL | 0 refills | Status: DC | PRN
Start: 2020-09-05 — End: 2020-10-04

## 2020-09-05 NOTE — Telephone Encounter (Signed)
Patient is requesting something for her nerves . 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 She is asking for just enough to get her through the next few days please. [facility]-

## 2020-09-05 NOTE — Telephone Encounter (Signed)
Patient notified of recommendations. 

## 2020-09-13 ENCOUNTER — Other Ambulatory Visit: Payer: Self-pay | Admitting: *Deleted

## 2020-09-13 MED ORDER — CITALOPRAM HYDROBROMIDE 40 MG PO TABS
40.0000 mg | ORAL_TABLET | Freq: Every day | ORAL | 0 refills | Status: DC
Start: 2020-09-13 — End: 2020-10-04

## 2020-09-13 MED ORDER — PANTOPRAZOLE SODIUM 40 MG PO TBEC
40.0000 mg | DELAYED_RELEASE_TABLET | Freq: Every morning | ORAL | 0 refills | Status: DC
Start: 2020-09-13 — End: 2020-10-05

## 2020-09-13 MED ORDER — LISINOPRIL 10 MG PO TABS
10.0000 mg | ORAL_TABLET | Freq: Every day | ORAL | 0 refills | Status: DC
Start: 2020-09-13 — End: 2020-09-22

## 2020-09-13 MED ORDER — METFORMIN HCL ER 750 MG PO TB24
750.0000 mg | ORAL_TABLET | Freq: Every day | ORAL | 0 refills | Status: DC
Start: 2020-09-13 — End: 2020-09-22

## 2020-09-13 MED ORDER — ATORVASTATIN CALCIUM 40 MG PO TABS
40.0000 mg | ORAL_TABLET | Freq: Every day | ORAL | 0 refills | Status: DC
Start: 2020-09-13 — End: 2020-10-05

## 2020-09-14 ENCOUNTER — Ambulatory Visit: Payer: 59 | Admitting: Family Medicine

## 2020-09-21 ENCOUNTER — Other Ambulatory Visit: Payer: Self-pay | Admitting: *Deleted

## 2020-09-21 ENCOUNTER — Telehealth: Payer: Self-pay

## 2020-09-21 NOTE — Telephone Encounter (Signed)
Pt has appt coming up on 10/04/20. Please advise. Thank you

## 2020-09-21 NOTE — Telephone Encounter (Signed)
Sent my chart message to schedule medication follow up

## 2020-09-21 NOTE — Telephone Encounter (Signed)
[name] called needing refill on metFORMIN (GLUCOPHAGE-XR) 750 MG 24 hr tablet,  Dulaglutide (TRULICITY) 0.51 GZ/3.5OI SOPN [[id number], lisinopril (ZESTRIL) 10 MG tablet [place], [place] - [address] Med Check on 11/16   Pt call back [phone]

## 2020-09-22 ENCOUNTER — Telehealth: Payer: Self-pay

## 2020-09-22 MED ORDER — METFORMIN HCL ER 750 MG PO TB24
750.0000 mg | ORAL_TABLET | Freq: Every day | ORAL | 0 refills | Status: DC
Start: 2020-09-22 — End: 2020-10-05

## 2020-09-22 MED ORDER — LISINOPRIL 10 MG PO TABS
10.0000 mg | ORAL_TABLET | Freq: Every day | ORAL | 0 refills | Status: DC
Start: 2020-09-22 — End: 2020-10-05

## 2020-09-22 MED ORDER — TRULICITY 0.75 MG/0.5ML ~~LOC~~ SOAJ: SUBCUTANEOUS | 0 refills | Status: DC

## 2020-09-22 NOTE — Telephone Encounter (Signed)
Patient is requesting refill on Dulaglutide 0.75 mg has been out for a week. She has medication follow up on 11/16. [facility] [place]

## 2020-09-22 NOTE — Telephone Encounter (Signed)
Please advise. Thank you

## 2020-09-22 NOTE — Telephone Encounter (Signed)
Refills sent in and detailed message left on voicemail(DPR)

## 2020-09-22 NOTE — Telephone Encounter (Signed)
Yes give 30 day supply till appt. Thx. Dr. Lovena Le

## 2020-09-22 NOTE — Telephone Encounter (Signed)
Refills sent in and detailed message left on voicemail (DPR)

## 2020-09-22 NOTE — Telephone Encounter (Signed)
Has appointment 11/16 for medication followup

## 2020-09-22 NOTE — Telephone Encounter (Signed)
Yes pls give 30 days supply till next visit. Thx. Dr t

## 2020-09-24 ENCOUNTER — Other Ambulatory Visit: Payer: Self-pay

## 2020-09-24 ENCOUNTER — Ambulatory Visit
Admission: EM | Admit: 2020-09-24 | Discharge: 2020-09-24 | Disposition: A | Discharge status: Home / Self Care | Payer: 59 | Attending: Emergency Medicine | Admitting: Emergency Medicine

## 2020-09-24 ENCOUNTER — Encounter: Payer: Self-pay | Admitting: Emergency Medicine

## 2020-09-24 DIAGNOSIS — J208 Acute bronchitis due to other specified organisms: Secondary | ICD-10-CM

## 2020-09-24 DIAGNOSIS — J9801 Acute bronchospasm: Secondary | ICD-10-CM

## 2020-09-24 MED ORDER — PREDNISONE 10 MG PO TABS: 20.0000 mg | ORAL_TABLET | Freq: Every day | ORAL | 0 refills | Status: DC

## 2020-09-24 MED ORDER — BENZONATATE 100 MG PO CAPS: 100.0000 mg | ORAL_CAPSULE | Freq: Three times a day (TID) | ORAL | 0 refills | Status: DC

## 2020-09-24 MED ORDER — AZITHROMYCIN 250 MG PO TABS: 250.0000 mg | ORAL_TABLET | Freq: Every day | ORAL | 0 refills | Status: DC

## 2020-09-24 MED ORDER — ALBUTEROL SULFATE HFA 108 (90 BASE) MCG/ACT IN AERS
1.0000 | INHALATION_SPRAY | Freq: Four times a day (QID) | RESPIRATORY_TRACT | 0 refills | Status: DC | PRN
Start: 2020-09-24 — End: 2021-01-12

## 2020-09-24 NOTE — ED Triage Notes (Signed)
Cough and chest congestion that has continued to get worse over past two weeks.  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  States a family member has smoked around her before and she got like this.

## 2020-09-24 NOTE — ED Provider Notes (Signed)
[place]   [id number] 09/24/20 Arrival Time: 6295   CC: URI  SUBJECTIVE: History from: patient.  [name] is a 54 y.o. female who presented to the urgent care with a complaint of cough after being exposed to smoking, chest congestion that is getting worse for the past 2 weeks.  Denies sick exposure to COVID, flu or strep.  Denies recent travel.  Has tried OTC medications with relief.  Denies aggravating factors.  Denies previous symptoms in the past.   Denies fever, chills, fatigue, sinus pain, rhinorrhea, sore throat, SOB, wheezing, chest pain, nausea, changes in bowel or bladder habits.    ROS: As per HPI.  All other pertinent ROS negative.      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   Iron deficiency anemia 03/25/2015   Morbid obesity ([place])    Shortness of breath    STD (sexually transmitted disease)    HPV, 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   Vaginal bleeding 03/08/2014   Past Surgical History:  Procedure Laterality Date   back surg x2     BREAST LUMPECTOMY Right 2012   CHOLECYSTECTOMY     COLONOSCOPY  11/03/07   4-mm sessile polyp removed/small internal hemorrhoids/tubular adenoma, random colon bx negative for microscopic colitis   COLONOSCOPY WITH PROPOFOL N/A 09/17/2016   Grade 2 hemorrhoids, colonic diverticulosis, ascending colon polyp (sessile serrated  adenoma), 5 year surveillance   COLONOSCOPY, ESOPHAGOGASTRODUODENOSCOPY (EGD) AND ESOPHAGEAL DILATION  01/2012   mild gastritis s/p biopsy. Empiric dilation. Normal duodenum.    DILATATION & CURETTAGE/HYSTEROSCOPY WITH MYOSURE N/A 02/10/2015   Procedure: DILATATION & CURETTAGE/HYSTEROSCOPY WITH MYOSURE;  Surgeon: Nunzio Cobbs, MD;  Location: [facility];  Service: Gynecology;  Laterality: N/A;   ESOPHAGOGASTRODUODENOSCOPY (EGD) WITH PROPOFOL N/A 01/11/2020   Procedure: ESOPHAGOGASTRODUODENOSCOPY (EGD) WITH PROPOFOL;  Surgeon: Daneil Dolin, MD;  [facility];  Service: Endoscopy;  Laterality: N/A;  10:30am-office rescheduled 2/22 @ 8:30am   MALONEY DILATION N/A 01/11/2020   Procedure: Venia Minks DILATION;  Surgeon: Daneil Dolin, MD;  [facility]: [facility];  Service: Endoscopy;  Laterality: N/A;   MM BREAST STEREO BX*L*R/S     rt.   POLYPECTOMY  09/17/2016   Procedure: POLYPECTOMY;  Surgeon: Daneil Dolin, MD;  [facility]: [facility];  Service: Endoscopy;;  ascending colon   PORT-A-CATH REMOVAL  05/26/2012   Procedure: REMOVAL PORT-A-CATH;  Surgeon: Jamesetta So, MD;  Location: [facility];  Service: General;  Laterality: N/A;  Minor Room   PORTACATH PLACEMENT     Allergies  Allergen Reactions   Flagyl [Metronidazole] Hives, Itching and Swelling   Lansoprazole Other (See Comments)    unknown   Pravastatin Other (See Comments)    Muscle aches   No current facility-administered medications on file prior to encounter.   Current Outpatient Medications on File Prior to Encounter  Medication  Sig Dispense Refill   atorvastatin (LIPITOR) 40 MG tablet Take 1 tablet (40 mg total) by mouth at bedtime. 30 tablet 0   blood glucose meter kit and supplies KIT Dispense based on patient and insurance preference. TEST ONCE DAILY. DIABETES TYPE 2 ICD 10 CODE E11.9 (Patient not taking: Reported on 05/07/2020) 1 each 5   blood glucose meter kit and supplies Dispense based on  patient and insurance preference. Use up to four times daily as directed. (FOR ICD-10 E10.9, E11.9). (Patient not taking: Reported on 05/07/2020) 1 each 5   citalopram (CELEXA) 40 MG tablet Take 1 tablet (40 mg total) by mouth daily. 30 tablet 0   Dulaglutide (TRULICITY) 8.25 KN/3.9JQ SOPN Inject 0.75 mg subcu once weekly 0.5 mL 0   ergocalciferol (VITAMIN D2) 1.25 MG (50000 UT) capsule Take 1 capsule (50,000 Units total) by mouth once a week. 4 capsule 12   HYDROcodone-acetaminophen (NORCO/VICODIN) 5-325 MG tablet One tablet by mouth every six hours as needed for pain.  Seven day limit (Patient taking differently: Take 1 tablet by mouth every 6 (six) hours as needed for moderate pain or severe pain. ) 25 tablet 0   lisinopril (ZESTRIL) 10 MG tablet Take 1 tablet (10 mg total) by mouth daily. 30 tablet 0   LORazepam (ATIVAN) 0.5 MG tablet Take 1 tablet (0.5 mg total) by mouth 2 (two) times daily as needed for anxiety. 15 tablet 0   metFORMIN (GLUCOPHAGE-XR) 750 MG 24 hr tablet Take 1 tablet (750 mg total) by mouth daily with breakfast. 30 tablet 0   ondansetron (ZOFRAN ODT) 4 MG disintegrating tablet Take 1 tablet (4 mg total) by mouth every 8 (eight) hours as needed for nausea or vomiting. 20 tablet 0   pantoprazole (PROTONIX) 40 MG tablet Take 1 tablet (40 mg total) by mouth every morning. 30 tablet 0   Social History   Socioeconomic History   Marital status: Married    Spouse name: Not on file   Number of children: 3   Years of education: Not on file   Highest education level: Not on file  Occupational History   Occupation: child care    Employer: [employer]  Tobacco Use   Smoking status: Never Smoker   Smokeless tobacco: Never Used  Vaping Use   Vaping Use: Never used  Substance and Sexual Activity   Alcohol use: No    Alcohol/week: 0.0 standard drinks   Drug use: No   Sexual activity: Yes    Partners: Male    Birth control/protection: None  Other  Topics Concern   Not on file  Social History Narrative   Not on file   Social Determinants of Health   Financial Resource Strain:    Difficulty of Paying Living Expenses: Not on file  Food Insecurity:    Worried About Charity fundraiser in the Last Year: Not on file   [employer] of Food in the Last Year: Not on file  Transportation Needs:    Lack of Transportation (Medical): Not on file   Lack of Transportation (Non-Medical): Not on file  Physical Activity:    Days of Exercise per Week: Not on file   Minutes of Exercise per Session: Not on file  Stress:    Feeling of Stress : Not on file  Social Connections:    Frequency of Communication with Friends and Family: Not on file   Frequency of Social Gatherings with Friends and Family: Not on file  Attends Religious Services: Not on file   Active Member of Clubs or Organizations: Not on file   Attends Banker Meetings: Not on file   Marital Status: Not on file  Intimate Partner Violence:    Fear of Current or Ex-Partner: Not on file   Emotionally Abused: Not on file   Physically Abused: Not on file   Sexually Abused: Not on file   Family History  Problem Relation Age of Onset   Colon cancer Mother        mid-50s, died of metastatic disease   Cancer Mother        liver   Coronary artery disease Mother    Diabetes type I Mother    Peripheral vascular disease Mother        Carotid disease in her 50s   Coronary artery disease Father        CAD in his 87s   Diabetes Father    Diabetes type I Father    Hypertension Father    Bipolar disorder Sister    Coronary artery disease Brother        MI in his 52s   Hypertension Brother    Hypertension Maternal Grandmother    Hyperlipidemia Maternal Grandmother    Stroke Maternal Grandmother    Hypertension Maternal Grandfather    Diabetes Maternal Grandfather    Diabetes Paternal Grandmother    Heart disease Paternal Grandfather      OBJECTIVE:  Vitals:   09/24/20 1053  Weight: 235 lb (106.6 kg)  Height: 5\' 4"  (1.626 m)     General appearance: alert; appears fatigued, but nontoxic; speaking in full sentences and tolerating own secretions HEENT: NCAT; Ears: EACs clear, TMs pearly gray; Eyes: PERRL.  EOM grossly intact. Sinuses: nontender; Nose: nares patent without rhinorrhea, Throat: oropharynx clear, tonsils non erythematous or enlarged, uvula midline  Neck: supple without LAD Lungs: unlabored respirations, symmetrical air entry; cough: moderate, marked; no respiratory distress; CTAB Heart: regular rate and rhythm.  Radial pulses 2+ symmetrical bilaterally Skin: warm and dry Psychological: alert and cooperative; normal mood and affect  LABS:  No results found for this or any previous visit (from the past 24 hour(s)).   ASSESSMENT & PLAN:  1. Acute bronchitis due to other specified organisms   2. Bronchospasm     Meds ordered this encounter  Medications   azithromycin (ZITHROMAX) 250 MG tablet    Sig: Take 1 tablet (250 mg total) by mouth daily. Take first 2 tablets together, then 1 every day until finished.    Dispense:  6 tablet    Refill:  0   albuterol (VENTOLIN HFA) 108 (90 Base) MCG/ACT inhaler    Sig: Inhale 1-2 puffs into the lungs every 6 (six) hours as needed for wheezing or shortness of breath.    Dispense:  18 g    Refill:  0   predniSONE (DELTASONE) 10 MG tablet    Sig: Take 2 tablets (20 mg total) by mouth daily.    Dispense:  15 tablet    Refill:  0   benzonatate (TESSALON) 100 MG capsule    Sig: Take 1 capsule (100 mg total) by mouth every 8 (eight) hours.    Dispense:  21 capsule    Refill:  0    Discharge instructions  Get plenty of rest and push fluids Tessalon Perles prescribed for cough Azithromycin was prescribed  ProAir was prescribed for bronchospasm Prednisone was prescribed Use medications daily for symptom relief Use  OTC medications like ibuprofen or  tylenol as needed fever or pain Call or go to the ED if you have any new or worsening symptoms such as fever, worsening cough, shortness of breath, chest tightness, chest pain, turning blue, changes in mental status, etc...   Reviewed expectations re: course of current medical issues. Questions answered. Outlined signs and symptoms indicating need for more acute intervention. Patient verbalized understanding. After Visit Summary given.         Emerson Monte, FNP 09/24/20 1120

## 2020-09-24 NOTE — Discharge Instructions (Addendum)
Get plenty of rest and push fluids Tessalon Perles prescribed for cough Azithromycin was prescribed  ProAir was prescribed for bronchospasm Prednisone was prescribed Use medications daily for symptom relief Use OTC medications like ibuprofen or tylenol as needed fever or pain Call or go to the ED if you have any new or worsening symptoms such as fever, worsening cough, shortness of breath, chest tightness, chest pain, turning blue, changes in mental status, etc..Marland Kitchen

## 2020-10-04 ENCOUNTER — Ambulatory Visit (INDEPENDENT_AMBULATORY_CARE_PROVIDER_SITE_OTHER): Payer: 59 | Admitting: Family Medicine

## 2020-10-04 ENCOUNTER — Other Ambulatory Visit: Payer: Self-pay

## 2020-10-04 ENCOUNTER — Encounter: Payer: Self-pay | Admitting: Family Medicine

## 2020-10-04 VITALS — BP 132/82 | HR 93 | Temp 97.6°F | Ht 64.0 in | Wt 239.0 lb

## 2020-10-04 DIAGNOSIS — F32A Depression, unspecified: Secondary | ICD-10-CM

## 2020-10-04 DIAGNOSIS — E119 Type 2 diabetes mellitus without complications: Secondary | ICD-10-CM

## 2020-10-04 DIAGNOSIS — F419 Anxiety disorder, unspecified: Secondary | ICD-10-CM | POA: Diagnosis not present

## 2020-10-04 DIAGNOSIS — I10 Essential (primary) hypertension: Secondary | ICD-10-CM | POA: Diagnosis not present

## 2020-10-04 DIAGNOSIS — D367 Benign neoplasm of other specified sites: Secondary | ICD-10-CM

## 2020-10-04 MED ORDER — LORAZEPAM 0.5 MG PO TABS: 0.5000 mg | ORAL_TABLET | Freq: Two times a day (BID) | ORAL | 0 refills | Status: DC | PRN

## 2020-10-04 MED ORDER — VENLAFAXINE HCL ER 37.5 MG PO CP24: 37.5000 mg | ORAL_CAPSULE | Freq: Every day | ORAL | 1 refills | Status: DC

## 2020-10-04 MED ORDER — FLUTICASONE PROPIONATE 50 MCG/ACT NA SUSP: 2.0000 | Freq: Every day | NASAL | 1 refills | Status: DC

## 2020-10-04 MED ORDER — CITALOPRAM HYDROBROMIDE 20 MG PO TABS: ORAL_TABLET | ORAL | 0 refills | Status: DC

## 2020-10-04 NOTE — Progress Notes (Signed)
Patient ID: [name], female    DOB: 04/04/1966, 54 y.o.   MRN: [id number]   Chief Complaint  Patient presents with  . Hypertension   Subjective:    HPI   Pt here for med check. Pt was on antibiotic for bronchitis. Not feeling much better from it. Pt also found a cyst under right arm about a month ago. Pt states it has becoming bigger. PHQ-9 and GAD 7 complete. Pt states that the Celexa is not working as well. Pt has been on celexa 84m since 2015.  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 Was feeling very down at that time and pt requested ativan for her anxiety.    Pt stating working w patients that smoke now and hard to work like that. Pt with recent bronchitis and taking abx, improving. Using inhaler for this.  Coughing improved.   Small cyst. In rt axilla/tricep about 1 inch.  Not painful unless [name] is manipulating it a lot. Pt feeling like it's getting bigger.  No redness or tenderness.  Has been there for months.  Pt has worsening with depression when winter starts.  Getting outside helps her.  Makes her feel more down. Tried wellbutrin in past for SAD. Wanting to sleep more, and low energy.   Medical History [place] a past medical history of Anxiety, Blood transfusion without reported diagnosis (1999), [place]), DDD (degenerative disc disease), lumbar, Depression (06/22/2011), Diverticulitis, DM [place]diabetes [place]) [place] (06[place]22[place]), Genital warts, GERD (gastroesophageal reflux disease) (06/22/2011), adenomatous colonic polyps (10/2007), Hypercholesterolemia (06/22/2011), Hypertension (06/22/2011), 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[place] (05/22/2011), Iron deficiency anemia (03/25/2015), Morbid [place], Shortness of breath, STD (sexually transmitted disease), and Vaginal bleeding (03/08/2014).   Outpatient Encounter Medications as of 10/04/2020    Medication Sig  . albuterol (VENTOLIN HFA) 108 (90 Base) MCG/ACT inhaler Inhale 1-2 puffs into the lungs every 6 (six) hours as needed for wheezing or shortness of breath.  .Marland Kitchenatorvastatin (LIPITOR) 40 MG tablet Take 1 tablet (40 mg total) by mouth at bedtime.  . blood glucose meter kit and supplies KIT Dispense based on patient and insurance preference. TEST ONCE DAILY. DIABETES TYPE 2 ICD 10 CODE E11.9  . blood glucose meter kit and supplies Dispense based on patient and insurance preference. Use up to four times daily as directed. (FOR ICD-10 E10.9, E11.9).  . Dulaglutide (TRULICITY) 00.25MEN/2.7POSOPN Inject 0.75 mg subcu once weekly  . ergocalciferol (VITAMIN D2) 1.25 MG (50000 UT) capsule Take 1 capsule (50,000 Units total) by mouth once a week.  .Marland Kitchenlisinopril (ZESTRIL) 10 MG tablet Take 1 tablet (10 mg total) by mouth daily.  .Marland KitchenLORazepam (ATIVAN) 0.5 MG tablet Take 1 tablet (0.5 mg total) by mouth 2 (two) times daily as needed for anxiety.  . metFORMIN (GLUCOPHAGE-XR) 750 MG 24 hr tablet Take 1 tablet (750 mg total) by mouth daily with breakfast.  . ondansetron (ZOFRAN ODT) 4 MG disintegrating tablet Take 1 tablet (4 mg total) by mouth every 8 (eight) hours as needed for nausea or vomiting.  . pantoprazole (PROTONIX) 40 MG tablet Take 1 tablet (40 mg total) by mouth every morning.  . [DISCONTINUED] atorvastatin (LIPITOR) 40 MG tablet Take 1 tablet (40 mg total) by mouth at bedtime.  . [DISCONTINUED] citalopram (CELEXA) 40 MG tablet Take 1 tablet (40 mg total) by mouth daily.  . [  DISCONTINUED] Dulaglutide (TRULICITY) 8.41 LK/4.4WN SOPN Inject 0.75 mg subcu once weekly  . [DISCONTINUED] lisinopril (ZESTRIL) 10 MG tablet Take 1 tablet (10 mg total) by mouth daily.  . [DISCONTINUED] LORazepam (ATIVAN) 0.5 MG tablet Take 1 tablet (0.5 mg total) by mouth 2 (two) times daily as needed for anxiety.  . [DISCONTINUED] metFORMIN (GLUCOPHAGE-XR) 750 MG 24 hr tablet Take 1 tablet (750 mg total) by mouth  daily with breakfast.  . [DISCONTINUED] pantoprazole (PROTONIX) 40 MG tablet Take 1 tablet (40 mg total) by mouth every morning.  . citalopram (CELEXA) 20 MG tablet Take 1 tab for 7 days, then take 1/2 tab for 7 days. Then discontinue.  . fluticasone (FLONASE) 50 MCG/ACT nasal spray Place 2 sprays into both nostrils daily.  Marland Kitchen venlafaxine XR (EFFEXOR XR) 37.5 MG 24 hr capsule Take 1 capsule (37.5 mg total) by mouth daily with breakfast.  . [DISCONTINUED] azithromycin (ZITHROMAX) 250 MG tablet Take 1 tablet (250 mg total) by mouth daily. Take first 2 tablets together, then 1 every day until finished.  . [DISCONTINUED] benzonatate (TESSALON) 100 MG capsule Take 1 capsule (100 mg total) by mouth every 8 (eight) hours.  . [DISCONTINUED] HYDROcodone-acetaminophen (NORCO/VICODIN) 5-325 MG tablet One tablet by mouth every six hours as needed for pain.  Seven day limit (Patient taking differently: Take 1 tablet by mouth every 6 (six) hours as needed for moderate pain or severe pain. )  . [DISCONTINUED] predniSONE (DELTASONE) 10 MG tablet Take 2 tablets (20 mg total) by mouth daily.   No facility-administered encounter medications on file as of 10/04/2020.     Review of Systems  Constitutional: Negative for chills and fever.  HENT: Negative for congestion, rhinorrhea and sore throat.   Respiratory: Negative for cough, shortness of breath and wheezing.   Cardiovascular: Negative for chest pain and leg swelling.  Gastrointestinal: Negative for abdominal pain, diarrhea, nausea and vomiting.  Genitourinary: Negative for dysuria and frequency.  Musculoskeletal: Negative for arthralgias and back pain.  Skin: Negative for rash.       +cyst, on rt tricep.  Neurological: Negative for dizziness, weakness and headaches.  Psychiatric/Behavioral: Positive for dysphoric mood. Negative for self-injury, sleep disturbance and suicidal ideas. The patient is not nervous/anxious.      Vitals BP 132/82   Pulse 93    Temp 97.6 F (36.4 C)   Ht 5' 4" (1.626 m)   Wt 239 lb (108.4 kg)   LMP 04/05/2016 Comment: spotting 11/2016 and 12/2016  SpO2 96%   BMI 41.02 kg/m   Objective:   Physical Exam Vitals and nursing note reviewed.  Constitutional:      Appearance: Normal appearance.  HENT:     Head: Normocephalic and atraumatic.     Nose: Nose normal.     Mouth/Throat:     Mouth: Mucous membranes are moist.     Pharynx: Oropharynx is clear.  Eyes:     Extraocular Movements: Extraocular movements intact.     Conjunctiva/sclera: Conjunctivae normal.     Pupils: Pupils are equal, round, and reactive to light.  Cardiovascular:     Rate and Rhythm: Normal rate and regular rhythm.     Pulses: Normal pulses.     Heart sounds: Normal heart sounds.  Pulmonary:     Effort: Pulmonary effort is normal.     Breath sounds: Normal breath sounds. No wheezing, rhonchi or rales.  Musculoskeletal:        General: Normal range of motion.     Right  lower leg: No edema.     Left lower leg: No edema.  Skin:    General: Skin is warm and dry.     Findings: No lesion or rash.     Comments: +1cm round, mobile cyst in fat pad of the rt tricep. No erythema, warmth, tenderness, or rash.  Neurological:     General: No focal deficit present.     Mental Status: [name] is alert and oriented to person, place, and time.  Psychiatric:        Mood and Affect: Mood normal.        Behavior: Behavior normal.      Assessment and Plan   1. Type 2 diabetes mellitus without complication, without long-term current use of insulin (HCC) - CBC - CMP14+EGFR - Hemoglobin A1c - Lipid panel; Future  2. Primary hypertension - CBC - CMP14+EGFR  3. Anxiety  4. Depression, unspecified depression type  5. Cyst, dermoid, arm, right    DM2- needs labs. Cont meds. Labs for diabetes, before can call in more refills.  Depression/anxiety/ SAD- suboptimal.  Start on effexor xr 37.54m, and taper off the celexa 474m  Gave taper  instructions. Gave ativan prn till pt on effexor at therapeutic dose.  Recommending to take it prn, discussed not a good long term solution to anxiety.  Pt in agreement. Also discussed light box therapy for SAD.  htn- suboptimal. Cont lisinopril. Cont to monitor and dec salt intake. Inc in exercising.  Reviewed labs- stable. Will refill dm and Hld meds.  Cyst- rt tricep. Cont to monitor.  Likely lipoma.  If having pain or redness to rto.  Pt declining referral to derm at this time.   F/u 4wks to recheck meds for anxiety/depression.

## 2020-10-04 NOTE — Patient Instructions (Addendum)
Light therapy box, on [employer] or [employer].  Take 20mg  celexa daily for 7 days. and take 37.5mg  effexor daily. Week 2- take 10mg  celexa for 7 days, then continue to take 37.5mg  effexor daily.  Call if having any concerns with medications.

## 2020-10-05 ENCOUNTER — Encounter: Payer: Self-pay | Admitting: Family Medicine

## 2020-10-05 LAB — CMP14+EGFR
ALT: 13 IU/L (ref 0–32)
AST: 11 IU/L (ref 0–40)
Albumin/Globulin Ratio: 1.5 (ref 1.2–2.2)
Albumin: 4.6 g/dL (ref 3.8–4.9)
Alkaline Phosphatase: 77 IU/L (ref 44–121)
BUN/Creatinine Ratio: 9 (ref 9–23)
BUN: 8 mg/dL (ref 6–24)
Bilirubin Total: 0.6 mg/dL (ref 0.0–1.2)
CO2: 28 mmol/L (ref 20–29)
Calcium: 9.5 mg/dL (ref 8.7–10.2)
Chloride: 100 mmol/L (ref 96–106)
Creatinine, Ser: 0.87 mg/dL (ref 0.57–1.00)
GFR calc Af Amer: 87 mL/min/{1.73_m2} (ref >59)
GFR calc non Af Amer: 76 mL/min/{1.73_m2} (ref >59)
Globulin, Total: 3 g/dL (ref 1.5–4.5)
Glucose: 90 mg/dL (ref 65–99)
Potassium: 4 mmol/L (ref 3.5–5.2)
Sodium: 142 mmol/L (ref 134–144)
Total Protein: 7.6 g/dL (ref 6.0–8.5)

## 2020-10-05 LAB — CBC
Hematocrit: 41.4 % (ref 34.0–46.6)
Hemoglobin: 13.8 g/dL (ref 11.1–15.9)
MCH: 28.3 pg (ref 26.6–33.0)
MCHC: 33.3 g/dL (ref 31.5–35.7)
MCV: 85 fL (ref 79–97)
Platelets: 247 10*3/uL (ref 150–450)
RBC: 4.88 x10E6/uL (ref 3.77–5.28)
RDW: 14.7 % (ref 11.7–15.4)
WBC: 8.8 10*3/uL (ref 3.4–10.8)

## 2020-10-05 LAB — HEMOGLOBIN A1C
Est. average glucose Bld gHb Est-mCnc: 128 mg/dL
Hgb A1c MFr Bld: 6.1 % — ABNORMAL HIGH (ref 4.8–5.6)

## 2020-10-05 MED ORDER — LISINOPRIL 10 MG PO TABS
10.0000 mg | ORAL_TABLET | Freq: Every day | ORAL | 1 refills | Status: DC
Start: 2020-10-05 — End: 2021-04-19

## 2020-10-05 MED ORDER — ATORVASTATIN CALCIUM 40 MG PO TABS
40.0000 mg | ORAL_TABLET | Freq: Every day | ORAL | 1 refills | Status: DC
Start: 2020-10-05 — End: 2020-11-24

## 2020-10-05 MED ORDER — METFORMIN HCL ER 750 MG PO TB24
750.0000 mg | ORAL_TABLET | Freq: Every day | ORAL | 1 refills | Status: DC
Start: 2020-10-05 — End: 2021-04-19

## 2020-10-05 MED ORDER — PANTOPRAZOLE SODIUM 40 MG PO TBEC
40.0000 mg | DELAYED_RELEASE_TABLET | Freq: Every morning | ORAL | 1 refills | Status: DC
Start: 2020-10-05 — End: 2021-01-30

## 2020-10-05 MED ORDER — TRULICITY 0.75 MG/0.5ML ~~LOC~~ SOAJ
SUBCUTANEOUS | 2 refills | Status: DC
Start: 2020-10-05 — End: 2021-01-13

## 2020-10-25 ENCOUNTER — Telehealth: Payer: Self-pay

## 2020-10-25 NOTE — Telephone Encounter (Signed)
Patient is having left ear pain with dizziness.She is requesting something to be called into [facility]

## 2020-10-25 NOTE — Telephone Encounter (Signed)
Needs visit please schedule

## 2020-10-27 ENCOUNTER — Ambulatory Visit (INDEPENDENT_AMBULATORY_CARE_PROVIDER_SITE_OTHER): Payer: 59 | Admitting: General Surgery

## 2020-10-27 ENCOUNTER — Other Ambulatory Visit: Payer: Self-pay

## 2020-10-27 ENCOUNTER — Encounter: Payer: Self-pay | Admitting: General Surgery

## 2020-10-27 VITALS — BP 142/90 | HR 86 | Temp 98.3°F | Resp 16 | Ht 64.0 in | Wt 240.0 lb

## 2020-10-27 DIAGNOSIS — D1721 Benign lipomatous neoplasm of skin and subcutaneous tissue of right arm: Secondary | ICD-10-CM | POA: Diagnosis not present

## 2020-10-27 NOTE — Progress Notes (Signed)
[facility] History and Physical  Reason for Referral: Right arm lipoma / cyst  Referring Physician:  Erven Colla, DO  Chief Complaint    New Patient (Initial Visit)      [name] is a 54 y.o. female.  HPI:  Ms. Herbison is a 54 yo with history of right breast cancer in 2012, HTN, DM, HLD, who has noticed a mass / lump on her right upper arm area over the bicep for several weeks now and felt like it was getting larger. She says when she first noted it she thought it would probably go away and she left it alone. She has since noticed it again and it feels larger than it started out being. She has no history of any infections or swelling in the area.  She did have right breast cancer and follows with this regularly and gets her mammograms.   Past Medical History:  Diagnosis Date  . Anxiety   . Blood transfusion without reported diagnosis 1999   due to heavy menses  . Breast cancer ([place]) 05/22/2011   03/01/11, Stage 2, s/p lumpectomy, chemo/xrt  . DDD (degenerative disc disease), lumbar   . Depression 06/22/2011  . Diverticulitis   . DM (diabetes mellitus) ([place]) 06/22/2011  . Genital warts   . GERD (gastroesophageal reflux disease) 06/22/2011  . Hx of adenomatous colonic polyps 10/2007   55m sigmoid tubular adenoma, FH colon cancer, mother in mid-50s  . Hypercholesterolemia 06/22/2011  . Hypertension 06/22/2011  . 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 05/22/2011  . Iron deficiency anemia 03/25/2015  . [place]bid [place]   . Shortness of breath   . 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 Vaginal bleeding 03/08/2014    Past Surgical History:  Procedure Laterality Date  . back surg x2    . BREAST LUMPECTOMY Right 2012  . CHOLECYSTECTOMY    . COLONOSCOPY  11/03/07   4-mm sessile polyp removed/small internal hemorrhoids/tubular adenoma, random colon bx negative for microscopic colitis  . COLONOSCOPY WITH PROPOFOL N/A  09/17/2016   Grade 2 hemorrhoids, colonic diverticulosis, ascending colon polyp (sessile serrated adenoma), 5 year surveillance  . COLONOSCOPY, ESOPHAGOGASTRODUODENOSCOPY (EGD) AND ESOPHAGEAL DILATION  01/2012   mild gastritis s/p biopsy. Empiric dilation. Normal duodenum.   .Marland KitchenDILATATION & CURETTAGE/HYSTEROSCOPY WITH MYOSURE N/A 02/10/2015   Procedure: DILATATION & CURETTAGE/HYSTEROSCOPY WITH MYOSURE;  Surgeon: BNunzio Cobbs MD;  [facility];  Service: Gynecology;  Laterality: N/A;  . ESOPHAGOGASTRODUODENOSCOPY (EGD) WITH PROPOFOL N/A 01/11/2020   Procedure: ESOPHAGOGASTRODUODENOSCOPY (EGD) WITH PROPOFOL;  Surgeon: RDaneil Dolin MD;  [facility];  Service: Endoscopy;  Laterality: N/A;  10:30am-office rescheduled 2/22 @ 8:30am  . MALONEY DILATION N/A 01/11/2020   Procedure: MVenia MinksDILATION;  Surgeon: RDaneil Dolin MD;  [facility];  Service: Endoscopy;  Laterality: N/A;  . MM BREAST STEREO BX*L*R/S     rt.  .Marland KitchenPOLYPECTOMY  09/17/2016   Procedure: POLYPECTOMY;  Surgeon: RDaneil Dolin MD;  [facility];  Service: Endoscopy;;  ascending colon  . PORT-A-CATH REMOVAL  05/26/2012   Procedure: REMOVAL PORT-A-CATH;  Surgeon: MJamesetta So MD;  Location: [facility];  Service: General;  Laterality: N/A;  Minor Room  . PORTACATH PLACEMENT      Family History  Problem Relation Age of Onset  . Colon cancer Mother        mid-50s, died of  metastatic disease  . Cancer Mother        liver  . Coronary artery disease Mother   . Diabetes type I Mother   . Peripheral vascular disease Mother        Carotid disease in her 36s  . Coronary artery disease Father        CAD in his 46s  . Diabetes Father   . Diabetes type I Father   . Hypertension Father   . Bipolar disorder Sister   . Coronary artery disease Brother        MI in his 78s  . Hypertension Brother   . Hypertension Maternal Grandmother   . Hyperlipidemia Maternal Grandmother   . Stroke  Maternal Grandmother   . Hypertension Maternal Grandfather   . Diabetes Maternal Grandfather   . Diabetes Paternal Grandmother   . Heart disease Paternal Grandfather     Social History   Tobacco Use  . Smoking status: Never Smoker  . Smokeless tobacco: Never Used  Vaping Use  . Vaping Use: Never used  Substance Use Topics  . Alcohol use: No    Alcohol/week: 0.0 standard drinks  . Drug use: No    Medications: I have reviewed the patient's current medications. Allergies as of 10/27/2020      Reactions   Flagyl [metronidazole] Hives, Itching, Swelling   Lansoprazole Other (See Comments)   unknown   Pravastatin Other (See Comments)   Muscle aches      Medication List       Accurate as of October 27, 2020  4:06 PM. If you have any questions, ask your nurse or doctor.        albuterol 108 (90 Base) MCG/ACT inhaler Commonly known as: VENTOLIN HFA Inhale 1-2 puffs into the lungs every 6 (six) hours as needed for wheezing or shortness of breath.   atorvastatin 40 MG tablet Commonly known as: LIPITOR Take 1 tablet (40 mg total) by mouth at bedtime.   blood glucose meter kit and supplies Dispense based on patient and insurance preference. Use up to four times daily as directed. (FOR ICD-10 E10.9, E11.9).   blood glucose meter kit and supplies Kit Dispense based on patient and insurance preference. TEST ONCE DAILY. DIABETES TYPE 2 ICD 10 CODE E11.9   citalopram 20 MG tablet Commonly known as: CELEXA Take 1 tab for 7 days, then take 1/2 tab for 7 days. Then discontinue.   ergocalciferol 1.25 MG (50000 UT) capsule Commonly known as: VITAMIN D2 Take 1 capsule (50,000 Units total) by mouth once a week.   fluticasone 50 MCG/ACT nasal spray Commonly known as: FLONASE Place 2 sprays into both nostrils daily.   lisinopril 10 MG tablet Commonly known as: ZESTRIL Take 1 tablet (10 mg total) by mouth daily.   LORazepam 0.5 MG tablet Commonly known as: ATIVAN Take 1 tablet  (0.5 mg total) by mouth 2 (two) times daily as needed for anxiety.   metFORMIN 750 MG 24 hr tablet Commonly known as: GLUCOPHAGE-XR Take 1 tablet (750 mg total) by mouth daily with breakfast.   ondansetron 4 MG disintegrating tablet Commonly known as: Zofran ODT Take 1 tablet (4 mg total) by mouth every 8 (eight) hours as needed for nausea or vomiting.   pantoprazole 40 MG tablet Commonly known as: PROTONIX Take 1 tablet (40 mg total) by mouth every morning.   Trulicity 2.50 IB/7.0WU Sopn Generic drug: Dulaglutide Inject 0.75 mg subcu once weekly   venlafaxine XR 37.5 MG 24 hr  capsule Commonly known as: Effexor XR Take 1 capsule (37.5 mg total) by mouth daily with breakfast.        ROS:  A comprehensive review of systems was negative except for: Cardiovascular: positive for chest pain Gastrointestinal: positive for abdominal pain and reflux symptoms Musculoskeletal: positive for back pain and joint pain  Blood pressure (!) 142/90, pulse 86, temperature 98.3 F (36.8 C), temperature source Oral, resp. rate 16, height 5' 4" (1.626 m), weight 240 lb (108.9 kg), last menstrual period 04/05/2016, SpO2 97 %. Physical Exam Vitals reviewed.  HENT:     Head: Normocephalic.     Nose: Nose normal.     Mouth/Throat:     Mouth: Mucous membranes are moist.  Cardiovascular:     Rate and Rhythm: Normal rate.  Pulmonary:     Effort: Pulmonary effort is normal.     Breath sounds: Normal breath sounds.  Abdominal:     General: There is no distension.     Palpations: Abdomen is soft.     Tenderness: There is no abdominal tenderness.  Musculoskeletal:        General: No swelling. Normal range of motion.     Cervical back: Normal range of motion.     Comments: Right upper arm, 1-1.5cm superficial mass, mobile, over 10cm from the axilla, overlying the muscle  Skin:    General: Skin is warm.  Neurological:     General: No focal deficit present.     Mental Status: She is alert and  oriented to person, place, and time.  Psychiatric:        Mood and Affect: Mood normal.        Behavior: Behavior normal.        Thought Content: Thought content normal.        Judgment: Judgment normal.     Results: None   Assessment & Plan:  SHARIECE VIVEIROS is a 54 y.o. female with a lipoma versus cyst in the right upper arm. We discussed excision in the office with local and discussed the risk of bleeding, infection, recurrence, finding an unexpected pathology. We discussed the option of [place] before excision but it is bothering her and she wants it out.    Future Appointments  Date Time Provider [place]  11/01/2020  2:10 PM Erven Colla, DO [facility]  11/15/2020 11:30 AM Virl Cagey, MD [facility] None  12/20/2020  4:00 PM Nunzio Cobbs, MD [place] None  04/27/2021  1:00 PM [facility] 1 [facility] [place] [facility]  04/27/2021  2:00 PM [facility] None  05/04/2021  1:00 PM Derek Jack, MD [facility] None     All questions were answered to the satisfaction of the patient.    Virl Cagey 10/27/2020, 4:06 PM

## 2020-10-27 NOTE — Patient Instructions (Signed)
Lipoma Removal  Lipoma removal is a surgical procedure to remove a lipoma, which is a noncancerous (benign) tumor that is made up of fat cells. Most lipomas are small and painless and do not require treatment. They can form in many areas of the body but are most common under the skin of the back, arms, shoulders, buttocks, and thighs. You may need lipoma removal if you have a lipoma that is large, growing, or causing discomfort. Lipoma removal may also be done for cosmetic reasons. Tell a health care provider about:  Any allergies you have.  All medicines you are taking, including vitamins, herbs, eye drops, creams, and over-the-counter medicines.  Any problems you or family members have had with anesthetic medicines.  Any blood disorders you have.  Any surgeries you have had.  Any medical conditions you have.  Whether you are pregnant or may be pregnant. What are the risks? Generally, this is a safe procedure. However, problems may occur, including:  Infection.  Bleeding.  Scarring.  Allergic reactions to medicines.  Damage to nearby structures or organs, such as damage to nerves or blood vessels near the lipoma. Medicines Ask your health care provider about:  Changing or stopping your regular medicines. This is especially important if you are taking diabetes medicines or blood thinners.  Taking medicines such as aspirin and ibuprofen. These medicines can thin your blood. Do not take these medicines unless your health care provider tells you to take them.  Taking over-the-counter medicines, vitamins, herbs, and supplements. General instructions  You will have a physical exam. Your health care provider will check the size of the lipoma and whether it can be moved easily.  You may have a biopsy and imaging tests, such as X-rays, a CT scan, and an MRI.  Do not use any products that contain nicotine or tobacco for at least 4 weeks before the procedure. These products include  cigarettes, e-cigarettes, and chewing tobacco. If you need help quitting, ask your health care provider.  Ask your health care provider: ? How your surgery site will be marked. ? What steps will be taken to help prevent infection. These may include:  Washing skin with a germ-killing soap.  Taking antibiotic medicine.  Plan to have someone take you home from the hospital or clinic.  If you will be going home right after the procedure, plan to have someone with you for 24 hours. What happens during the procedure?   An IV will be inserted into one of your veins.  You will be given one or more of the following: ? A medicine to help you relax (sedative). ? A medicine to numb the area (local anesthetic). ? A medicine to make you fall asleep (general anesthetic). ? A medicine that is injected into an area of your body to numb everything below the injection site (regional anesthetic).  An incision will be made over the lipoma or very near the lipoma. The incision may be made in a natural skin line or crease.  Tissues, nerves, and blood vessels near the lipoma will be moved out of the way.  The lipoma and the capsule that surrounds it will be separated from the surrounding tissues.  The lipoma will be removed.  The incision may be closed with stitches (sutures).  A bandage (dressing) will be placed over the incision. The procedure may vary among health care providers and hospitals. What happens after the procedure?  Your blood pressure, heart rate, breathing rate, and blood oxygen level  will be monitored until you leave the hospital or clinic.  If you were prescribed an antibiotic medicine, use it as told by your health care provider. Do not stop using the antibiotic even if you start to feel better.  If you were given a sedative during the procedure, it can affect you for several hours. Do not drive or operate machinery until your health care provider says that it is  safe. Summary  Before the procedure, follow instructions from your health care provider about eating and drinking, and changing or stopping your regular medicines. This is especially important if you are taking diabetes medicines or blood thinners.  After the lipoma is removed, the incision may be closed with stitches (sutures) and covered with a bandage (dressing).  If you were given a sedative during the procedure, it can affect you for several hours. Do not drive or operate machinery until your health care provider says that it is safe. This information is not intended to replace advice given to you by your health care provider. Make sure you discuss any questions you have with your health care provider. Document Revised: 06/22/2019 Document Reviewed: 06/22/2019 Elsevier Patient Education  [place].

## 2020-10-31 ENCOUNTER — Other Ambulatory Visit: Payer: Self-pay

## 2020-10-31 ENCOUNTER — Ambulatory Visit
Admission: EM | Admit: 2020-10-31 | Discharge: 2020-10-31 | Disposition: A | Discharge status: Home / Self Care | Payer: 59 | Attending: Emergency Medicine | Admitting: Emergency Medicine

## 2020-10-31 DIAGNOSIS — R42 Dizziness and giddiness: Secondary | ICD-10-CM | POA: Diagnosis not present

## 2020-10-31 DIAGNOSIS — J069 Acute upper respiratory infection, unspecified: Secondary | ICD-10-CM

## 2020-10-31 DIAGNOSIS — H6592 Unspecified nonsuppurative otitis media, left ear: Secondary | ICD-10-CM

## 2020-10-31 DIAGNOSIS — Z1152 Encounter for screening for COVID-19: Secondary | ICD-10-CM

## 2020-10-31 MED ORDER — BENZONATATE 100 MG PO CAPS: 100.0000 mg | ORAL_CAPSULE | Freq: Three times a day (TID) | ORAL | 0 refills | Status: DC

## 2020-10-31 MED ORDER — CETIRIZINE HCL 10 MG PO TABS: 10.0000 mg | ORAL_TABLET | Freq: Every day | ORAL | 0 refills | Status: DC

## 2020-10-31 MED ORDER — PREDNISONE 10 MG PO TABS: 20.0000 mg | ORAL_TABLET | Freq: Every day | ORAL | 0 refills | Status: DC

## 2020-10-31 MED ORDER — ONDANSETRON 4 MG PO TBDP: 4.0000 mg | ORAL_TABLET | Freq: Three times a day (TID) | ORAL | 0 refills | Status: DC | PRN

## 2020-10-31 MED ORDER — FLUTICASONE PROPIONATE 50 MCG/ACT NA SUSP: 1.0000 | Freq: Every day | NASAL | 0 refills | Status: DC

## 2020-10-31 NOTE — ED Triage Notes (Signed)
Pt presents with dizziness , nausea and some abdominal pain for past 3 days, needs work note

## 2020-10-31 NOTE — Discharge Instructions (Signed)
COVID for 19, flu A/B testing ordered.  It will take between 2-7 days for test results.  Someone will contact you regarding abnormal results.    In the meantime: You should remain isolated in your home for 10 days from symptom onset AND greater than 24 hours after symptoms resolution (absence of fever without the use of fever-reducing medication and improvement in respiratory symptoms), whichever is longer Get plenty of rest and push fluids Tessalon Perles prescribed for cough Zyrtec for nasal congestion, runny nose, and/or sore throat Flonase for nasal congestion and runny nose Zofran prescribed for nausea Prednisone was prescribed Use medications daily for symptom relief Use OTC medications like ibuprofen or tylenol as needed fever or pain Call or go to the ED if you have any new or worsening symptoms such as fever, worsening cough, shortness of breath, chest tightness, chest pain, turning blue, changes in mental status, etc..Marland Kitchen

## 2020-10-31 NOTE — ED Provider Notes (Signed)
[place]   [id number] 10/31/20 Arrival Time: 6433   Chief Complaint  Patient presents with  . Abdominal Pain     SUBJECTIVE: History from: patient and family.  [name] is a 54 y.o. female who resented to the urgent care for complaint of nausea, abdominal pain, dizziness, left ear pain, cough and congestion for the past 3 days.  Has been told at work to have a COVID-19 test done before returning.  Denies sick exposure to COVID, flu or strep.  Denies recent travel.  Has tried OTC medication without relief.  Denies aggravating factor.  Denies previous symptoms in the past.   Denies fever, chills, fatigue, sinus pain, rhinorrhea, sore throat, SOB, wheezing, chest pain,  changes in bowel or bladder habits.    ROS: As per HPI.  All other pertinent ROS negative.      Past Medical History:  Diagnosis Date  . Anxiety   . Blood transfusion without reported diagnosis 1999   due to heavy menses  . Breast cancer ([place]) 05/22/2011   03/01/11, Stage 2, s/p lumpectomy, chemo/xrt  . DDD (degenerative disc disease), lumbar   . Depression 06/22/2011  . Diverticulitis   . DM (diabetes mellitus) ([place]) 06/22/2011  . Genital warts   . GERD (gastroesophageal reflux disease) 06/22/2011  . Hx of adenomatous colonic polyps 10/2007   64m sigmoid tubular adenoma, FH colon cancer, mother in mid-50s  . Hypercholesterolemia 06/22/2011  . Hypertension 06/22/2011  . 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[place] 05/22/2011  . Iron deficiency anemia 03/25/2015  . Morbid obesity [place]   . Shortness of breath   . 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 Vaginal bleeding 03/08/2014   Past Surgical History:  Procedure Laterality Date  . back surg x2    . BREAST LUMPECTOMY Right 2012  . CHOLECYSTECTOMY    . COLONOSCOPY  11/03/07   4-mm sessile polyp removed/small internal hemorrhoids/tubular adenoma, random colon bx negative for microscopic colitis  . COLONOSCOPY  WITH PROPOFOL N/A 09/17/2016   Grade 2 hemorrhoids, colonic diverticulosis, ascending colon polyp (sessile serrated adenoma), 5 year surveillance  . COLONOSCOPY, ESOPHAGOGASTRODUODENOSCOPY (EGD) AND ESOPHAGEAL DILATION  01/2012   mild gastritis s/p biopsy. Empiric dilation. Normal duodenum.   .Marland KitchenDILATATION & CURETTAGE/HYSTEROSCOPY WITH MYOSURE N/A 02/10/2015   Procedure: DILATATION & CURETTAGE/HYSTEROSCOPY WITH MYOSURE;  Surgeon: BNunzio Cobbs MD;  [facility];  Service: Gynecology;  Laterality: N/A;  . ESOPHAGOGASTRODUODENOSCOPY (EGD) WITH PROPOFOL N/A 01/11/2020   Procedure: ESOPHAGOGASTRODUODENOSCOPY (EGD) WITH PROPOFOL;  Surgeon: RDaneil Dolin MD;  [facility];  Service: Endoscopy;  Laterality: N/A;  10:30am-office rescheduled 2/22 @ 8:30am  . MALONEY DILATION N/A 01/11/2020   Procedure: MVenia MinksDILATION;  Surgeon: RDaneil Dolin MD;  [facility];  Service: Endoscopy;  Laterality: N/A;  . MM BREAST STEREO BX*L*R/S     rt.  .Marland KitchenPOLYPECTOMY  09/17/2016   Procedure: POLYPECTOMY;  Surgeon: RDaneil Dolin MD;  [facility];  Service: Endoscopy;;  ascending colon  . PORT-A-CATH REMOVAL  05/26/2012   Procedure: REMOVAL PORT-A-CATH;  Surgeon: MJamesetta So MD;  Location: [facility];  Service: General;  Laterality: N/A;  Minor Room  . PORTACATH PLACEMENT     Allergies  Allergen Reactions  . Flagyl [Metronidazole] Hives, Itching and Swelling  . Lansoprazole Other (See Comments)    unknown  . Pravastatin Other (See Comments)    Muscle  aches   No current facility-administered medications on file prior to encounter.   Current Outpatient Medications on File Prior to Encounter  Medication Sig Dispense Refill  . albuterol (VENTOLIN HFA) 108 (90 Base) MCG/ACT inhaler Inhale 1-2 puffs into the lungs every 6 (six) hours as needed for wheezing or shortness of breath. 18 g 0  . atorvastatin (LIPITOR) 40 MG tablet Take 1 tablet (40 mg total) by mouth at  bedtime. 90 tablet 1  . blood glucose meter kit and supplies KIT Dispense based on patient and insurance preference. TEST ONCE DAILY. DIABETES TYPE 2 ICD 10 CODE E11.9 1 each 5  . blood glucose meter kit and supplies Dispense based on patient and insurance preference. Use up to four times daily as directed. (FOR ICD-10 E10.9, E11.9). 1 each 5  . citalopram (CELEXA) 20 MG tablet Take 1 tab for 7 days, then take 1/2 tab for 7 days. Then discontinue. 15 tablet 0  . Dulaglutide (TRULICITY) 1.95 KD/3.2IZ SOPN Inject 0.75 mg subcu once weekly 0.5 mL 2  . ergocalciferol (VITAMIN D2) 1.25 MG (50000 UT) capsule Take 1 capsule (50,000 Units total) by mouth once a week. 4 capsule 12  . lisinopril (ZESTRIL) 10 MG tablet Take 1 tablet (10 mg total) by mouth daily. 90 tablet 1  . LORazepam (ATIVAN) 0.5 MG tablet Take 1 tablet (0.5 mg total) by mouth 2 (two) times daily as needed for anxiety. 30 tablet 0  . metFORMIN (GLUCOPHAGE-XR) 750 MG 24 hr tablet Take 1 tablet (750 mg total) by mouth daily with breakfast. 90 tablet 1  . pantoprazole (PROTONIX) 40 MG tablet Take 1 tablet (40 mg total) by mouth every morning. 90 tablet 1  . venlafaxine XR (EFFEXOR XR) 37.5 MG 24 hr capsule Take 1 capsule (37.5 mg total) by mouth daily with breakfast. 30 capsule 1   Social History   Socioeconomic History  . Marital status: Married    Spouse name: Not on file  . Number of children: 3  . Years of education: Not on file  . Highest education level: Not on file  Occupational History  . Occupation: child care    Employer: [employer]  Tobacco Use  . Smoking status: Never Smoker  . Smokeless tobacco: Never Used  Vaping Use  . Vaping Use: Never used  Substance and Sexual Activity  . Alcohol use: No    Alcohol/week: 0.0 standard drinks  . Drug use: No  . Sexual activity: Yes    Partners: Male    Birth control/protection: None  Other Topics Concern  . Not on file  Social History Narrative  . Not on file    Social Determinants of Health   Financial Resource Strain: Not on file  Food Insecurity: Not on file  Transportation Needs: Not on file  Physical Activity: Not on file  Stress: Not on file  Social Connections: Not on file  Intimate Partner Violence: Not on file   Family History  Problem Relation Age of Onset  . Colon cancer Mother        mid-50s, died of metastatic disease  . Cancer Mother        liver  . Coronary artery disease Mother   . Diabetes type I Mother   . Peripheral vascular disease Mother        Carotid disease in her 32s  . Coronary artery disease Father        CAD in his 32s  . Diabetes Father   .  Diabetes type I Father   . Hypertension Father   . Bipolar disorder Sister   . Coronary artery disease Brother        MI in his 79s  . Hypertension Brother   . Hypertension Maternal Grandmother   . Hyperlipidemia Maternal Grandmother   . Stroke Maternal Grandmother   . Hypertension Maternal Grandfather   . Diabetes Maternal Grandfather   . Diabetes Paternal Grandmother   . Heart disease Paternal Grandfather     OBJECTIVE:  Vitals:   10/31/20 1022  BP: 135/84  Pulse: 91  Resp: 15  Temp: 98.7 F (37.1 C)  SpO2: 96%     General appearance: alert; appears fatigued, but nontoxic; speaking in full sentences and tolerating own secretions HEENT: NCAT; Ears: EACs clear, right TMs pearly gray, left TM with middle ear effusion; Eyes: PERRL.  EOM grossly intact. Sinuses: nontender; Nose: nares patent without rhinorrhea, Throat: oropharynx clear, tonsils non erythematous or enlarged, uvula midline  Neck: supple without LAD Lungs: unlabored respirations, symmetrical air entry; cough: moderate; no respiratory distress; CTAB Heart: regular rate and rhythm.  Radial pulses 2+ symmetrical bilaterally Skin: warm and dry Psychological: alert and cooperative; normal mood and affect  LABS:  No results found for this or any previous visit (from the past 24 hour(s)).    ASSESSMENT & PLAN:  1. Middle ear effusion, left   2. Dizziness   3. URI with cough and congestion   4. Encounter for screening for COVID-19     Meds ordered this encounter  Medications  . benzonatate (TESSALON) 100 MG capsule    Sig: Take 1 capsule (100 mg total) by mouth every 8 (eight) hours.    Dispense:  30 capsule    Refill:  0  . ondansetron (ZOFRAN ODT) 4 MG disintegrating tablet    Sig: Take 1 tablet (4 mg total) by mouth every 8 (eight) hours as needed for nausea or vomiting.    Dispense:  20 tablet    Refill:  0  . fluticasone (FLONASE) 50 MCG/ACT nasal spray    Sig: Place 1 spray into both nostrils daily for 7 days.    Dispense:  16 g    Refill:  0  . cetirizine (ZYRTEC ALLERGY) 10 MG tablet    Sig: Take 1 tablet (10 mg total) by mouth daily.    Dispense:  30 tablet    Refill:  0  . predniSONE (DELTASONE) 10 MG tablet    Sig: Take 2 tablets (20 mg total) by mouth daily.    Dispense:  15 tablet    Refill:  0    Discharge instructions  COVID for 19, flu A/B testing ordered.  It will take between 2-7 days for test results.  Someone will contact you regarding abnormal results.    In the meantime: You should remain isolated in your home for 10 days from symptom onset AND greater than 24 hours after symptoms resolution (absence of fever without the use of fever-reducing medication and improvement in respiratory symptoms), whichever is longer Get plenty of rest and push fluids Tessalon Perles prescribed for cough Zyrtec for nasal congestion, runny nose, and/or sore throat Flonase for nasal congestion and runny nose Zofran prescribed for nausea Prednisone was prescribed Use medications daily for symptom relief Use OTC medications like ibuprofen or tylenol as needed fever or pain Call or go to the ED if you have any new or worsening symptoms such as fever, worsening cough, shortness of breath, chest tightness, chest  pain, turning blue, changes in mental status,  etc...   Reviewed expectations re: course of current medical issues. Questions answered. Outlined signs and symptoms indicating need for more acute intervention. Patient verbalized understanding. After Visit Summary given.         Emerson Monte, FNP 10/31/20 1110

## 2020-11-01 ENCOUNTER — Other Ambulatory Visit: Payer: Self-pay

## 2020-11-01 ENCOUNTER — Telehealth (INDEPENDENT_AMBULATORY_CARE_PROVIDER_SITE_OTHER): Payer: 59 | Admitting: Family Medicine

## 2020-11-01 ENCOUNTER — Telehealth: Payer: Self-pay | Admitting: Family Medicine

## 2020-11-01 DIAGNOSIS — F324 Major depressive disorder, single episode, in partial remission: Secondary | ICD-10-CM | POA: Diagnosis not present

## 2020-11-01 DIAGNOSIS — F419 Anxiety disorder, unspecified: Secondary | ICD-10-CM

## 2020-11-01 MED ORDER — PAROXETINE HCL 10 MG PO TABS: ORAL_TABLET | ORAL | 1 refills | Status: DC

## 2020-11-01 NOTE — Progress Notes (Signed)
Patient ID: [name], female    DOB: 04/06/1966, 54 y.o.   MRN: [id number]   Virtual Visit via Video Note  I connected with [name] on 11/01/20 at  2:10 PM EST by a video enabled telemedicine application and verified that I am speaking with the correct person using two identifiers.  Location: Patient: home Provider: office   I discussed the limitations of evaluation and management by telemedicine and the availability of in person appointments. The patient expressed understanding and agreed to proceed.  Chief Complaint  Patient presents with  . Anxiety   Subjective:    HPI Pt needing follow up on anxiety. Pt states she is taking her meds but feels the same and is having nausea.  Pt went to Urgent Care yesterday for URI and fluid in both ears. Pt is out sick for past 3 days.   Pt has fluid on both ears, and went to Urgent care.  Pt was treated with prednisone and flonase. Nausea meds and zyrtec.   H/o anxiety- Pt was on paxil in past and helped with panic attacks. Still not feeling like it's controlled.   Pt wasn't having so much of breathing issue till around a client is smoking. Pt used to be on Paxil CR 37.84m in the past and thought it worked well.    Medical History [name] a past medical history of Anxiety, Blood transfusion without reported diagnosis (1999), [place]05/[place]), DDD (degenerative disc disease), lumbar, Depression (06/22/2011), Diverticulitis, DM (diabetes mellitus) ([place] (06/22/2011), Genital warts, GERD (gastroesophageal reflux disease) (06/22/2011), adenomatous colonic polyps (10/2007), Hypercholesterolemia (06/22/2011), Hypertension (06/22/2011), 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[facility] (05/22/2011), Iron deficiency anemia (03/25/2015), Morbid obesity ([facility], Shortness of breath, STD (sexually transmitted disease), and Vaginal bleeding (03/08/2014).   Outpatient Encounter Medications as of 11/01/2020  Medication Sig  .  albuterol (VENTOLIN HFA) 108 (90 Base) MCG/ACT inhaler Inhale 1-2 puffs into the lungs every 6 (six) hours as needed for wheezing or shortness of breath.  .Marland Kitchenatorvastatin (LIPITOR) 40 MG tablet Take 1 tablet (40 mg total) by mouth at bedtime.  . benzonatate (TESSALON) 100 MG capsule Take 1 capsule (100 mg total) by mouth every 8 (eight) hours.  . blood glucose meter kit and supplies KIT Dispense based on patient and insurance preference. TEST ONCE DAILY. DIABETES TYPE 2 ICD 10 CODE E11.9  . blood glucose meter kit and supplies Dispense based on patient and insurance preference. Use up to four times daily as directed. (FOR ICD-10 E10.9, E11.9).  . cetirizine (ZYRTEC ALLERGY) 10 MG tablet Take 1 tablet (10 mg total) by mouth daily.  . Dulaglutide (TRULICITY) 04.62MMM/3.8TRSOPN Inject 0.75 mg subcu once weekly  . ergocalciferol (VITAMIN D2) 1.25 MG (50000 UT) capsule Take 1 capsule (50,000 Units total) by mouth once a week.  . fluticasone (FLONASE) 50 MCG/ACT nasal spray Place 1 spray into both nostrils daily for 7 days.  .Marland Kitchenlisinopril (ZESTRIL) 10 MG tablet Take 1 tablet (10 mg total) by mouth daily.  .Marland KitchenLORazepam (ATIVAN) 0.5 MG tablet Take 1 tablet (0.5 mg total) by mouth 2 (two) times daily as needed for anxiety.  . metFORMIN (GLUCOPHAGE-XR) 750 MG 24 hr tablet Take 1 tablet (750 mg total) by mouth daily with breakfast.  . ondansetron (ZOFRAN ODT) 4 MG disintegrating tablet Take 1 tablet (4 mg total) by mouth every 8 (eight) hours as needed for nausea or vomiting.  . pantoprazole (PROTONIX) 40 MG tablet Take  1 tablet (40 mg total) by mouth every morning.  . predniSONE (DELTASONE) 10 MG tablet Take 2 tablets (20 mg total) by mouth daily.  . [DISCONTINUED] venlafaxine XR (EFFEXOR XR) 37.5 MG 24 hr capsule Take 1 capsule (37.5 mg total) by mouth daily with breakfast.  . PARoxetine (PAXIL) 10 MG tablet Take 1 tab p.o. daily for 7 days, then increase to 2 tab daily.  . [DISCONTINUED] citalopram (CELEXA) 20  MG tablet Take 1 tab for 7 days, then take 1/2 tab for 7 days. Then discontinue.   No facility-administered encounter medications on file as of 11/01/2020.     Review of Systems  Constitutional: Negative for chills and fever.  HENT: Negative for congestion, rhinorrhea and sore throat.   Respiratory: Negative for cough, shortness of breath and wheezing.   Cardiovascular: Negative for chest pain and leg swelling.  Gastrointestinal: Negative for abdominal pain, diarrhea, nausea and vomiting.  Genitourinary: Negative for dysuria and frequency.  Musculoskeletal: Negative for arthralgias and back pain.  Skin: Negative for rash.  Neurological: Negative for dizziness, weakness and headaches.  Psychiatric/Behavioral: Negative for dysphoric mood, self-injury, sleep disturbance and suicidal ideas. The patient is nervous/anxious.      Vitals LMP 04/05/2016 Comment: spotting 11/2016 and 12/2016  Objective:   Physical Exam  No PE due to phone visit.  Assessment and Plan   1. Anxiety - PARoxetine (PAXIL) 10 MG tablet; Take 1 tab p.o. daily for 7 days, then increase to 2 tab daily.  Dispense: 45 tablet; Refill: 1  2. Major depressive disorder with single episode, in partial remission (HCC) - PARoxetine (PAXIL) 10 MG tablet; Take 1 tab p.o. daily for 7 days, then increase to 2 tab daily.  Dispense: 45 tablet; Refill: 1   Anxiety- not controlled. Will start with paxil 30m and inc to 21m  Pt to take ativan prn, pt has some tablets left.   F/u 1 mo or prn.   Follow Up Instructions:    I discussed the assessment and treatment plan with the patient. The patient was provided an opportunity to ask questions and all were answered. The patient agreed with the plan and demonstrated an understanding of the instructions.   The patient was advised to call back or seek an in-person evaluation if the symptoms worsen or if the condition fails to improve as anticipated.  I provided 12 minutes of  non-face-to-face time during this encounter.

## 2020-11-01 NOTE — Telephone Encounter (Signed)
Ms. ysabella, babiarz are scheduled for a virtual visit with your provider today.    Just as we do with appointments in the office, we must obtain your consent to participate.  Your consent will be active for this visit and any virtual visit you may have with one of our providers in the next 365 days.    If you have a MyChart account, I can also send a copy of this consent to you electronically.  All virtual visits are billed to your insurance company just like a traditional visit in the office.  As this is a virtual visit, video technology does not allow for your provider to perform a traditional examination.  This may limit your provider's ability to fully assess your condition.  If your provider identifies any concerns that need to be evaluated in person or the need to arrange testing such as labs, EKG, etc, we will make arrangements to do so.    Although advances in technology are sophisticated, we cannot ensure that it will always work on either your end or our end.  If the connection with a video visit is poor, we may have to switch to a telephone visit.  With either a video or telephone visit, we are not always able to ensure that we have a secure connection.   I need to obtain your verbal consent now.   Are you willing to proceed with your visit today?   [name] has provided verbal consent on 11/01/2020 for a virtual visit (video or telephone).   [name], LPN [id number]/1779  3:90 PM

## 2020-11-02 LAB — COVID-19, FLU A+B NAA
Influenza A, NAA: NOT DETECTED
Influenza B, NAA: NOT DETECTED
SARS-CoV-2, NAA: NOT DETECTED

## 2020-11-10 ENCOUNTER — Ambulatory Visit: Payer: 59 | Admitting: General Surgery

## 2020-11-14 ENCOUNTER — Other Ambulatory Visit: Payer: 59

## 2020-11-15 ENCOUNTER — Other Ambulatory Visit: Payer: Self-pay

## 2020-11-15 ENCOUNTER — Ambulatory Visit (INDEPENDENT_AMBULATORY_CARE_PROVIDER_SITE_OTHER): Payer: 59 | Admitting: General Surgery

## 2020-11-15 ENCOUNTER — Encounter: Payer: Self-pay | Admitting: Family Medicine

## 2020-11-15 ENCOUNTER — Encounter: Payer: Self-pay | Admitting: General Surgery

## 2020-11-15 VITALS — BP 136/85 | HR 90 | Temp 98.6°F | Resp 14 | Ht 65.0 in | Wt 242.0 lb

## 2020-11-15 DIAGNOSIS — D1721 Benign lipomatous neoplasm of skin and subcutaneous tissue of right arm: Secondary | ICD-10-CM

## 2020-11-15 MED ORDER — HYDROCODONE-ACETAMINOPHEN 5-325 MG PO TABS: 1.0000 | ORAL_TABLET | Freq: Four times a day (QID) | ORAL | 0 refills | Status: DC | PRN

## 2020-11-15 NOTE — Patient Instructions (Signed)
Lipoma Removal, Care After This sheet gives you information about how to care for yourself after your procedure. Your health care provider may also give you more specific instructions. If you have problems or questions, contact your health care provider. What can I expect after the procedure? After the procedure, it is common to have:  Mild pain.  Swelling.  Bruising. Follow these instructions at home: Bathing   Do not take baths, swim, or use a hot tub until your health care provider approves. Ask your health care provider if you may take showers. You may only be allowed to take sponge baths.  Keep your bandage (dressing) dry until your health care provider says it can be removed. Incision care   Follow instructions from your health care provider about how to take care of your incision. Make sure you: ? Wash your hands with soap and water for at least 20 seconds before and after you change your dressing. If soap and water are not available, use hand sanitizer. ? Change your dressing as told by your health care provider. ? Leave stitches (sutures), skin glue, or adhesive strips in place. These skin closures may need to stay in place for 2 weeks or longer. If adhesive strip edges start to loosen and curl up, you may trim the loose edges. Do not remove adhesive strips completely unless your health care provider tells you to do that.  Check your incision area every day for signs of infection. Check for: ? More redness, swelling, or pain. ? Fluid or blood. ? Warmth. ? Pus or a bad smell. Medicines  Take over-the-counter and prescription medicines only as told by your health care provider.  If you were prescribed an antibiotic medicine, use it as told by your health care provider. Do not stop using the antibiotic even if you start to feel better. General instructions   If you were given a sedative during the procedure, it can affect you for several hours. Do not drive or operate  machinery until your health care provider says that it is safe.  Do not use any products that contain nicotine or tobacco, such as cigarettes, e-cigarettes, and chewing tobacco. These can delay healing. If you need help quitting, ask your health care provider.  Return to your normal activities as told by your health care provider. Ask your health care provider what activities are safe for you.  Keep all follow-up visits as told by your health care provider. This is important. Contact a health care provider if:  You have more redness, swelling, or pain around your incision.  You have fluid or blood coming from your incision.  Your incision feels warm to the touch.  You have pus or a bad smell coming from your incision.  You have pain that does not get better with medicine. Get help right away if:  You have chills or a fever.  You have severe pain. Summary  After the procedure, it is common to have mild pain, swelling, and bruising.  Follow instructions from your health care provider about how to take care of your incision.  Check your incision area every day for signs of infection.  Contact a health care provider if you have more redness, swelling, or pain around your incision. This information is not intended to replace advice given to you by your health care provider. Make sure you discuss any questions you have with your health care provider. Document Revised: 06/22/2019 Document Reviewed: 06/22/2019 Elsevier Patient Education  2020 Elsevier   Inc.  

## 2020-11-15 NOTE — Progress Notes (Signed)
[facility] Procedure Note  11/15/2020   Preoperative Diagnosis:  Right arm lipoma    Postoperative Diagnosis: Same   Procedure(s) Performed:  Excision of lipoma right arm 2cm    Surgeon: Leatrice Jewels. Henreitta Leber, MD   Anesthesia: 1% Xylocaine with epinephrine    Specimens: Right arm lipoma    Estimated Blood Loss: Minimal   Wound Class:Clean    Operative Indications: [name] is a 54 yo with a growing tender mass in the right upper arm. This is over 10 cm from her axilla and is superficial in nature. She feels like it is growing but it has not shown signs of infection or drainage. Discussed excision and risk of bleeding, infection, finding something other than a lipoma.   Findings: Lipomatous hard lesion    Procedure: The patient was taken to the procedure room and placed semi upright with her right arm tucked above her head to expose the right upper medial arm.  The area was palpated and was prepared and draped in the usual sterile fashion.  Xylocaine with epinephrine was injected around the lesion and intradermal. An incision was made over the area and carried down through the dermis with sharp dissection. Using a combination of sharp dissection with scissors and blunt dissection. The hardened globular fatty tissue was removed. The area was palpated and no additional masses were noted. I could feel her muscle and tendinous band inferior.  Hemostasis was achieved. The area was closed with interrupted 3-0 Vicryl and a running 4-0 Monocryl subcuticular and Dermabond.    Final inspection revealed acceptable hemostasis. The patient tolerated the procedure well. The specimen was sent to pathology.  Rx for Norco 5-325 mg q 6 PRN # 6 sent to pharmacy.  Will call with results and follow up.    Algis Greenhouse, MD [facility] [address] [place], [place] [address] [phone] (office)

## 2020-11-16 ENCOUNTER — Other Ambulatory Visit: Payer: Self-pay | Admitting: General Surgery

## 2020-11-23 ENCOUNTER — Other Ambulatory Visit: Payer: Self-pay | Admitting: Family Medicine

## 2020-11-28 ENCOUNTER — Telehealth: Payer: Self-pay | Admitting: Family Medicine

## 2020-11-28 NOTE — Telephone Encounter (Signed)
Pharmacy states that patients new insurance will not pay for Paroxetine 10 mg tablets 2 twice daily (max 30 for 30 days). May pharmacy have a new rx for 20 mg o every day. Please advise. Thank you

## 2020-11-29 MED ORDER — PAROXETINE HCL 20 MG PO TABS: ORAL_TABLET | ORAL | 2 refills | Status: DC

## 2020-11-29 NOTE — Telephone Encounter (Signed)
Med sent to pharmacy. Left message to return call

## 2020-11-29 NOTE — Telephone Encounter (Signed)
Pt returned call but nurse not available, pt left message. Returned call to patient and informed her of new dose. Pt verbalized understanding.

## 2020-11-29 NOTE — Addendum Note (Signed)
Addended by: [name] on: 11/29/2020 08:38 AM   Modules accepted: Orders

## 2020-11-29 NOTE — Telephone Encounter (Signed)
Yes, pls order 20mg  paxil daily. #30 with 2 refills. Thx. Dr. Lovena Le

## 2020-12-01 ENCOUNTER — Other Ambulatory Visit: Payer: Self-pay

## 2020-12-01 ENCOUNTER — Telehealth: Payer: Self-pay | Admitting: Family Medicine

## 2020-12-01 ENCOUNTER — Telehealth (INDEPENDENT_AMBULATORY_CARE_PROVIDER_SITE_OTHER): Payer: Self-pay | Admitting: Family Medicine

## 2020-12-01 DIAGNOSIS — M5416 Radiculopathy, lumbar region: Secondary | ICD-10-CM

## 2020-12-01 DIAGNOSIS — F324 Major depressive disorder, single episode, in partial remission: Secondary | ICD-10-CM

## 2020-12-01 DIAGNOSIS — F419 Anxiety disorder, unspecified: Secondary | ICD-10-CM

## 2020-12-01 DIAGNOSIS — F0631 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: Secondary | ICD-10-CM

## 2020-12-01 MED ORDER — PAROXETINE HCL 10 MG PO TABS: ORAL_TABLET | ORAL | 0 refills | Status: DC

## 2020-12-01 MED ORDER — PAROXETINE HCL 40 MG PO TABS: 40.0000 mg | ORAL_TABLET | ORAL | 1 refills | Status: DC

## 2020-12-01 NOTE — Telephone Encounter (Signed)
[name], putnam are scheduled for a virtual visit with your provider today.    Just as we do with appointments in the office, we must obtain your consent to participate.  Your consent will be active for this visit and any virtual visit you may have with one of our providers in the next 365 days.    If you have a MyChart account, I can also send a copy of this consent to you electronically.  All virtual visits are billed to your insurance company just like a traditional visit in the office.  As this is a virtual visit, video technology does not allow for your provider to perform a traditional examination.  This may limit your provider's ability to fully assess your condition.  If your provider identifies any concerns that need to be evaluated in person or the need to arrange testing such as labs, EKG, etc, we will make arrangements to do so.    Although advances in technology are sophisticated, we cannot ensure that it will always work on either your end or our end.  If the connection with a video visit is poor, we may have to switch to a telephone visit.  With either a video or telephone visit, we are not always able to ensure that we have a secure connection.   I need to obtain your verbal consent now.   Are you willing to proceed with your visit today?   [name] has provided verbal consent on 12/01/2020 for a virtual visit (video or telephone).   [name], LPN 7/93/9030  0:92 PM

## 2020-12-01 NOTE — Progress Notes (Signed)
Patient ID: [name], female    DOB: 02/11/1966, 55 y.o.   MRN: [id number]   Virtual Visit via Video Note  I connected with [name] on 12/01/20 at  2:10 PM EST by a video enabled telemedicine application and verified that I am speaking with the correct person using two identifiers.  Location: Patient: home Provider: office   I discussed the limitations of evaluation and management by telemedicine and the availability of in person appointments. The patient expressed understanding and agreed to proceed.   Chief Complaint  Patient presents with  . Anxiety   Subjective:    HPI Pt having follow up on anxiety. Pt doing well with anxiety.  Per nurse note-However, having issues with back and leg pain. Legs are starting to give out. Both legs affected but left leg mainly. Legs has been going numb. Aching in joints also. Pt had 2 back surgeries a couple years ago.   Pt was increased the paxil from $RemoveBe'10mg'rGiEdblGa$  to $R'20mg'tj$ .  Slightly better.  Not feeling where should be.  having some moments of tearful and feeling down.   Pt feeling having some problems with leg and back pain. H/o " deterioation" of her back in 2002. More she is working and bending and lifting a lot and worsening.  Left leg hurting that caused the back surgery. Had on in 2002, with 2 back surgeries. Feeling her leg is giving out. Used to see Dr. Durene Cal for surgery. 2nd surgery- left leg with numbness with pain.  Now having numbness and can't walk. Almost fell this week.   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   Outpatient Encounter Medications as of 12/01/2020  Medication Sig  . albuterol (VENTOLIN HFA) 108 (90 Base) MCG/ACT inhaler Inhale 1-2 puffs into the lungs every 6 (six) hours as needed for wheezing or shortness of breath.  Marland Kitchen atorvastatin (LIPITOR) 40 MG tablet TAKE 1 TABLET BY MOUTH AT BEDTIME  . benzonatate (TESSALON) 100 MG capsule Take 1 capsule (100 mg total) by mouth every 8 (eight) hours.  . blood glucose meter kit and supplies KIT Dispense based on patient and insurance preference. TEST ONCE DAILY. DIABETES TYPE 2 ICD 10 CODE E11.9  . blood glucose meter kit and supplies Dispense based on patient and insurance preference. Use up to four times daily as directed. (FOR ICD-10 E10.9, E11.9).  . cetirizine (ZYRTEC ALLERGY) 10 MG tablet Take 1 tablet (10 mg total) by mouth daily.  . Dulaglutide (TRULICITY) 4.97 WY/6.3ZC SOPN Inject 0.75 mg subcu once weekly  . ergocalciferol (VITAMIN D2) 1.25 MG (50000 UT) capsule Take 1 capsule (50,000 Units total) by mouth once a week.  Marland Kitchen HYDROcodone-acetaminophen (NORCO) 5-325 MG tablet Take 1 tablet by mouth every 6 (six) hours as needed for severe pain.  Marland Kitchen lisinopril (ZESTRIL) 10 MG tablet Take 1 tablet (10 mg total) by mouth daily.  Marland Kitchen LORazepam (ATIVAN) 0.5 MG tablet Take 1 tablet (0.5 mg total) by mouth 2 (two) times daily as needed for anxiety.  . metFORMIN (GLUCOPHAGE-XR) 750 MG 24 hr tablet Take 1 tablet (750 mg  total) by mouth daily with breakfast.  . ondansetron (ZOFRAN ODT) 4 MG disintegrating tablet Take 1 tablet (4 mg total) by mouth every 8 (eight) hours as needed for nausea or vomiting.  . pantoprazole (PROTONIX) 40 MG tablet Take 1 tablet (40 mg total) by mouth every morning.  Marland Kitchen PARoxetine (PAXIL) 20 MG tablet Take one tablet po daily  . [START ON  12/18/2020] PARoxetine (PAXIL) 40 MG tablet Take 1 tablet (40 mg total) by mouth every morning.  . [DISCONTINUED] PARoxetine (PAXIL) 10 MG tablet Take 1 tab p.o. daily for 7 days, then increase to 2 tab daily.  . fluticasone (FLONASE) 50 MCG/ACT nasal spray Place 1 spray into both nostrils daily for 7 days.  Marland Kitchen PARoxetine (PAXIL) 10 MG tablet Take 1 tab p.o. daily with the $Remove'20mg'yYVUMUk$  tablet for 14 days.  . [DISCONTINUED] predniSONE (DELTASONE) 10 MG tablet Take 2 tablets (20 mg total) by mouth daily.   No facility-administered encounter medications on file as of 12/01/2020.     Review of Systems  Constitutional: Negative for chills and fever.  HENT: Negative for congestion, rhinorrhea and sore throat.   Respiratory: Negative for cough, shortness of breath and wheezing.   Cardiovascular: Negative for chest pain and leg swelling.  Gastrointestinal: Negative for abdominal pain, diarrhea, nausea and vomiting.  Genitourinary: Negative for dysuria and frequency.  Musculoskeletal: Positive for back pain (chronic). Negative for arthralgias.  Skin: Negative for rash.  Neurological: Positive for numbness (left leg). Negative for dizziness, weakness and headaches.  Psychiatric/Behavioral: Positive for dysphoric mood (slight improvement). Negative for self-injury, sleep disturbance and suicidal ideas. The patient is not nervous/anxious.      Vitals LMP 04/05/2016 Comment: spotting 11/2016 and 12/2016  Objective:   Physical Exam  No PE due to phone visit.  Assessment and Plan   1. Major depressive disorder with single episode, in partial remission (HCC) - PARoxetine (PAXIL) 10 MG tablet; Take 1 tab p.o. daily with the $Remove'20mg'jlTmObY$  tablet for 14 days.  Dispense: 14 tablet; Refill: 0  2. Anxiety - PARoxetine (PAXIL) 10 MG tablet; Take 1 tab p.o. daily with the $Remove'20mg'ktMLDPL$  tablet for 14 days.  Dispense: 14 tablet; Refill: 0  3. 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 Lumbar  radiculopathy    Anxiety/depression- suboptimal.  Will increase paxil.  Pt tapering up from $Remov'20mg'OTJTQX$  paxil, to $RemoveB'30mg'CNlPFvIf$  paxil for 2 wks, then increase to $RemoveBef'40mg'XMBYuMhGsJ$  paxil daily.  Call in 4 wks for f/u visit and in 1 wk for in person visit on the back pain.  Follow Up Instructions:    I discussed the assessment and treatment plan with the patient. The patient was provided an opportunity to ask questions and all were answered. The patient agreed with the plan and demonstrated an understanding of the instructions.   The patient was advised to call back or seek an in-person evaluation if the symptoms worsen or if the condition fails to improve as anticipated.  I provided 15 minutes of non-face-to-face time during this encounter.

## 2020-12-08 ENCOUNTER — Telehealth: Payer: Self-pay | Admitting: *Deleted

## 2020-12-08 NOTE — Telephone Encounter (Signed)
Pt called because [facility] told her they did not know which dose of paxil to fill. Note on paxil 40 to start 1/30. 20 mg sent in on 1/11 with 2 refills. Pt seen 1/13. Note states to take 20mg  for 14 days then go to 40mg . Pt states she is currently taking 30mg  that she started yesterday. I asked pt how many days was she told to take 30mg  before going to 40 and she was unsure. Please advise.

## 2020-12-09 NOTE — Telephone Encounter (Signed)
Called and discussed with pt and [facility] pharm both verbalized understanding

## 2020-12-09 NOTE — Telephone Encounter (Signed)
Pt was supposed to take 30mg  for 14 days, then go to 40mg  daily of paxil.   Thx.   Dr. Lovena Le

## 2020-12-09 NOTE — Telephone Encounter (Signed)
error 

## 2020-12-14 ENCOUNTER — Ambulatory Visit
Admission: EM | Admit: 2020-12-14 | Discharge: 2020-12-14 | Disposition: A | Discharge status: Home / Self Care | Payer: 59 | Attending: Family Medicine | Admitting: Family Medicine

## 2020-12-14 ENCOUNTER — Other Ambulatory Visit: Payer: Self-pay

## 2020-12-14 ENCOUNTER — Ambulatory Visit (INDEPENDENT_AMBULATORY_CARE_PROVIDER_SITE_OTHER): Payer: 59

## 2020-12-14 ENCOUNTER — Encounter: Payer: Self-pay | Admitting: Emergency Medicine

## 2020-12-14 DIAGNOSIS — M25511 Pain in right shoulder: Secondary | ICD-10-CM

## 2020-12-14 DIAGNOSIS — W19XXXA Unspecified fall, initial encounter: Secondary | ICD-10-CM

## 2020-12-14 MED ORDER — CYCLOBENZAPRINE HCL 10 MG PO TABS: 10.0000 mg | ORAL_TABLET | Freq: Two times a day (BID) | ORAL | 0 refills | Status: DC | PRN

## 2020-12-14 MED ORDER — HYDROCODONE-ACETAMINOPHEN 5-325 MG PO TABS: 1.0000 | ORAL_TABLET | Freq: Four times a day (QID) | ORAL | 0 refills | Status: AC | PRN

## 2020-12-14 NOTE — ED Provider Notes (Signed)
[facility]   [id number] 12/14/20 Arrival Time: 1611  EU:MPNTI PAIN  SUBJECTIVE: History from: patient. [name] is a 55 y.o. female complains of right shoulder pain after a fall last week.  Reports that she tried to catch herself with her right arm.  Reports that she is having limited range of motion she cannot raise her arm above her shoulder in front of her or out to the side.  Reports that she cannot lift as much on that side as she used to be able to.  Denies hearing any pops or cracks when she fell.  Reports that the area is swollen and very tender to the anterior shoulder.  Has been taking ibuprofen and Tylenol with no relief.  Has been using ice area with no relief. Denies a precipitating event or specific injury.    Describes the pain as intermittent and sharp in character.  Symptoms are made worse with activity.  Denies similar symptoms in the past.  Denies fever, chills, erythema, ecchymosis, weakness, numbness and tingling, saddle paresthesias, loss of bowel or bladder function.      ROS: As per HPI.  All other pertinent ROS negative.     Past Medical History:  Diagnosis Date  . Anxiety   . Blood transfusion without reported diagnosis 1999   due to heavy menses  . Breast cancer (HCC) 05/22/2011   03/01/11, Stage 2, s/p lumpectomy, chemo/xrt  . DDD (degenerative disc disease), lumbar   . Depression 06/22/2011  . Diverticulitis   . DM (diabetes mellitus) (HCC) 06/22/2011  . Genital warts   . GERD (gastroesophageal reflux disease) 06/22/2011  . Hx of adenomatous colonic polyps 10/2007   56mm sigmoid tubular adenoma, FH colon cancer, mother in mid-50s  . Hypercholesterolemia 06/22/2011  . Hypertension 06/22/2011  . 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 Iron deficiency anemia 03/25/2015  . Morbid obesity (HCC)   . Shortness of breath   . 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 Vaginal bleeding 03/08/2014   Past  Surgical History:  Procedure Laterality Date  . back surg x2    . BREAST LUMPECTOMY Right 2012  . CHOLECYSTECTOMY    . COLONOSCOPY  11/03/07   4-mm sessile polyp removed/small internal hemorrhoids/tubular adenoma, random colon bx negative for microscopic colitis  . COLONOSCOPY WITH PROPOFOL N/A 09/17/2016   Grade 2 hemorrhoids, colonic diverticulosis, ascending colon polyp (sessile serrated adenoma), 5 year surveillance  . COLONOSCOPY, ESOPHAGOGASTRODUODENOSCOPY (EGD) AND ESOPHAGEAL DILATION  01/2012   mild gastritis s/p biopsy. Empiric dilation. Normal duodenum.   Marland Kitchen DILATATION & CURETTAGE/HYSTEROSCOPY WITH MYOSURE N/A 02/10/2015   Procedure: DILATATION & CURETTAGE/HYSTEROSCOPY WITH MYOSURE;  Surgeon: Patton Salles, MD;  Location: [facility];  Service: Gynecology;  Laterality: N/A;  . ESOPHAGOGASTRODUODENOSCOPY (EGD) WITH PROPOFOL N/A 01/11/2020   Procedure: ESOPHAGOGASTRODUODENOSCOPY (EGD) WITH PROPOFOL;  Surgeon: Corbin Ade, MD;  Location: [facility];  Service: Endoscopy;  Laterality: N/A;  10:30am-office rescheduled 2/22 @ 8:30am  . MALONEY DILATION N/A 01/11/2020   Procedure: Elease Hashimoto DILATION;  Surgeon: Corbin Ade, MD;  Location: [facility];  Service: Endoscopy;  Laterality: N/A;  . MM BREAST STEREO BX*L*R/S     rt.  Marland Kitchen POLYPECTOMY  09/17/2016   Procedure: POLYPECTOMY;  Surgeon: Corbin Ade, MD;  Location: [facility];  Service: Endoscopy;;  ascending colon  . PORT-A-CATH REMOVAL  05/26/2012   Procedure: REMOVAL PORT-A-CATH;  Surgeon: Loraine Leriche  Lowella Petties, MD;  Location: [facility];  Service: General;  Laterality: N/A;  Minor Room  . PORTACATH PLACEMENT     Allergies  Allergen Reactions  . Flagyl [Metronidazole] Hives, Itching and Swelling  . Lansoprazole Other (See Comments)    unknown  . Pravastatin Other (See Comments)    Muscle aches   No current facility-administered medications on file prior to encounter.   Current Outpatient Medications on File Prior to  Encounter  Medication Sig Dispense Refill  . albuterol (VENTOLIN HFA) 108 (90 Base) MCG/ACT inhaler Inhale 1-2 puffs into the lungs every 6 (six) hours as needed for wheezing or shortness of breath. 18 g 0  . atorvastatin (LIPITOR) 40 MG tablet TAKE 1 TABLET BY MOUTH AT BEDTIME 90 tablet 0  . benzonatate (TESSALON) 100 MG capsule Take 1 capsule (100 mg total) by mouth every 8 (eight) hours. 30 capsule 0  . blood glucose meter kit and supplies KIT Dispense based on patient and insurance preference. TEST ONCE DAILY. DIABETES TYPE 2 ICD 10 CODE E11.9 1 each 5  . blood glucose meter kit and supplies Dispense based on patient and insurance preference. Use up to four times daily as directed. (FOR ICD-10 E10.9, E11.9). 1 each 5  . cetirizine (ZYRTEC ALLERGY) 10 MG tablet Take 1 tablet (10 mg total) by mouth daily. 30 tablet 0  . Dulaglutide (TRULICITY) 5.09 TO/6.7TI SOPN Inject 0.75 mg subcu once weekly 0.5 mL 2  . ergocalciferol (VITAMIN D2) 1.25 MG (50000 UT) capsule Take 1 capsule (50,000 Units total) by mouth once a week. 4 capsule 12  . fluticasone (FLONASE) 50 MCG/ACT nasal spray Place 1 spray into both nostrils daily for 7 days. 16 g 0  . lisinopril (ZESTRIL) 10 MG tablet Take 1 tablet (10 mg total) by mouth daily. 90 tablet 1  . LORazepam (ATIVAN) 0.5 MG tablet Take 1 tablet (0.5 mg total) by mouth 2 (two) times daily as needed for anxiety. 30 tablet 0  . metFORMIN (GLUCOPHAGE-XR) 750 MG 24 hr tablet Take 1 tablet (750 mg total) by mouth daily with breakfast. 90 tablet 1  . ondansetron (ZOFRAN ODT) 4 MG disintegrating tablet Take 1 tablet (4 mg total) by mouth every 8 (eight) hours as needed for nausea or vomiting. 20 tablet 0  . pantoprazole (PROTONIX) 40 MG tablet Take 1 tablet (40 mg total) by mouth every morning. 90 tablet 1  . PARoxetine (PAXIL) 10 MG tablet Take 1 tab p.o. daily with the $Remove'20mg'upkzOoR$  tablet for 14 days. 14 tablet 0  . PARoxetine (PAXIL) 20 MG tablet Take one tablet po daily 30 tablet  2  . [START ON 12/18/2020] PARoxetine (PAXIL) 40 MG tablet Take 1 tablet (40 mg total) by mouth every morning. 30 tablet 1   Social History   Socioeconomic History  . Marital status: Married    Spouse name: Not on file  . Number of children: 3  . Years of education: Not on file  . Highest education level: Not on file  Occupational History  . Occupation: child care    Employer: [employer]  Tobacco Use  . Smoking status: Never Smoker  . Smokeless tobacco: Never Used  Vaping Use  . Vaping Use: Never used  Substance and Sexual Activity  . Alcohol use: No    Alcohol/week: 0.0 standard drinks  . Drug use: No  . Sexual activity: Yes    Partners: Male    Birth control/protection: None  Other Topics Concern  . Not  on file  Social History Narrative  . Not on file   Social Determinants of Health   Financial Resource Strain: Not on file  Food Insecurity: Not on file  Transportation Needs: Not on file  Physical Activity: Not on file  Stress: Not on file  Social Connections: Not on file  Intimate Partner Violence: Not on file   Family History  Problem Relation Age of Onset  . Colon cancer Mother        mid-50s, died of metastatic disease  . Cancer Mother        liver  . Coronary artery disease Mother   . Diabetes type I Mother   . Peripheral vascular disease Mother        Carotid disease in her 70s  . Coronary artery disease Father        CAD in his 49s  . Diabetes Father   . Diabetes type I Father   . Hypertension Father   . Bipolar disorder Sister   . Coronary artery disease Brother        MI in his 21s  . Hypertension Brother   . Hypertension Maternal Grandmother   . Hyperlipidemia Maternal Grandmother   . Stroke Maternal Grandmother   . Hypertension Maternal Grandfather   . Diabetes Maternal Grandfather   . Diabetes Paternal Grandmother   . Heart disease Paternal Grandfather     OBJECTIVE:  Vitals:   12/14/20 1620  BP: (!) 149/87  Pulse: (!) 103   Resp: 16  Temp: 98 F (36.7 C)  TempSrc: Oral  SpO2: 95%    General appearance: ALERT; in no acute distress.  Head: NCAT Lungs: Normal respiratory effort CV: pulses 2+ bilaterally. Cap refill < 2 seconds Musculoskeletal:  Inspection: Skin warm, dry, clear and intact No erythema Swelling to anterior right shoulder Palpation: Anterior right shoulder tender to palpation ROM: Limited ROM active and passive to right shoulder Skin: warm and dry Neurologic: Ambulates without difficulty; Sensation intact about the upper/ lower extremities Psychological: alert and cooperative; normal mood and affect  DIAGNOSTIC STUDIES:  DG Shoulder Right  Result Date: 12/14/2020 CLINICAL DATA:  Shoulder pain following fall 1 week ago, initial encounter EXAM: RIGHT SHOULDER - 2+ VIEW COMPARISON:  None. FINDINGS: Degenerative changes of the acromioclavicular joint are seen. No acute fracture or dislocation is seen. Mild glenohumeral degenerative changes are noted as well. Underlying bony thorax is within limits. IMPRESSION: Degenerative change without acute abnormality. Electronically Signed   By: Inez Catalina M.D.   On: 12/14/2020 16:42     ASSESSMENT & PLAN:  1. Acute pain of right shoulder   2. Fall, initial encounter      Meds ordered this encounter  Medications  . HYDROcodone-acetaminophen (NORCO/VICODIN) 5-325 MG tablet    Sig: Take 1 tablet by mouth every 6 (six) hours as needed for up to 3 days for moderate pain or severe pain.    Dispense:  10 tablet    Refill:  0    Order Specific Question:   Supervising Provider    Answer:   Chase Picket [id number]  . cyclobenzaprine (FLEXERIL) 10 MG tablet    Sig: Take 1 tablet (10 mg total) by mouth 2 (two) times daily as needed for muscle spasms.    Dispense:  20 tablet    Refill:  0    Order Specific Question:   Supervising Provider    Answer:   Chase Picket [[id number]]    X-ray today shows degenerative changes,  no acute fractures or  dislocations Prescribed Flexeril Prescribed Norco for moderate to severe pain Placed ambulatory referral for orthopedics Ortho care  Follow-up there if symptoms are persisting Continue conservative management of rest, ice, and gentle stretches Take ibuprofen as needed for pain relief (may cause abdominal discomfort, ulcers, and GI bleeds avoid taking with other NSAIDs) Take cyclobenzaprine at nighttime for symptomatic relief. Avoid driving or operating heavy machinery while using medication. Follow up with PCP if symptoms persist Return or go to the ER if you have any new or worsening symptoms (fever, chills, chest pain, abdominal pain, changes in bowel or bladder habits, pain radiating into lower legs)   [place] Controlled Substances Registry consulted for this patient. I feel the risk/benefit ratio today is favorable for proceeding with this prescription for a controlled substance. Medication sedation precautions given.  Reviewed expectations re: course of current medical issues. Questions answered. Outlined signs and symptoms indicating need for more acute intervention. Patient verbalized understanding. After Visit Summary given.       Faustino Congress, NP 12/14/20 1648

## 2020-12-14 NOTE — ED Triage Notes (Signed)
RT shoulder pain after a fall last week

## 2020-12-14 NOTE — Discharge Instructions (Addendum)
Xray today shows degenerative changes, no acute fractures or dislocations  I have sent in [place] for you to take for pain, one tablet every 6 hours as needed for moderate to severe pain  I have sent in flexeril for you to take twice a day as needed for muscle spasms. This medication can make you sleepy. Do not drive or operate heavy machinery with this medication.  Take ibuprofen as needed.  Rest and elevate your arm.  Apply ice packs 2-3 times a day for up to 20 minutes each.    Follow up with your primary care provider or an orthopedist if you symptoms continue or worsen;  Or if you develop new symptoms, such as numbness, tingling, or weakness.

## 2020-12-20 ENCOUNTER — Ambulatory Visit: Payer: 59 | Admitting: Obstetrics and Gynecology

## 2020-12-22 ENCOUNTER — Encounter: Payer: Self-pay | Admitting: Family Medicine

## 2020-12-22 ENCOUNTER — Ambulatory Visit (INDEPENDENT_AMBULATORY_CARE_PROVIDER_SITE_OTHER): Payer: 59 | Admitting: Family Medicine

## 2020-12-22 VITALS — BP 110/70 | HR 94 | Temp 97.0°F | Ht 65.0 in | Wt 241.0 lb

## 2020-12-22 DIAGNOSIS — F324 Major depressive disorder, single episode, in partial remission: Secondary | ICD-10-CM

## 2020-12-22 DIAGNOSIS — E119 Type 2 diabetes mellitus without complications: Secondary | ICD-10-CM | POA: Diagnosis not present

## 2020-12-22 DIAGNOSIS — E1165 Type 2 diabetes mellitus with hyperglycemia: Secondary | ICD-10-CM

## 2020-12-22 DIAGNOSIS — R110 Nausea: Secondary | ICD-10-CM | POA: Diagnosis not present

## 2020-12-22 LAB — POCT GLYCOSYLATED HEMOGLOBIN (HGB A1C): Hemoglobin A1C: 5.5 % (ref 4.0–5.6)

## 2020-12-22 MED ORDER — ONDANSETRON 4 MG PO TBDP: 4.0000 mg | ORAL_TABLET | Freq: Three times a day (TID) | ORAL | 0 refills | Status: DC | PRN

## 2020-12-22 MED ORDER — CITALOPRAM HYDROBROMIDE 40 MG PO TABS: 40.0000 mg | ORAL_TABLET | Freq: Every day | ORAL | 1 refills | Status: DC

## 2020-12-22 NOTE — Patient Instructions (Signed)
Hyperglycemia Hyperglycemia is when the sugar (glucose) level in your blood is too high. High blood sugar can happen to people who have or do not have diabetes. High blood sugar can happen quickly. It can be an emergency. What are the causes? If you have diabetes, high blood sugar may be caused by:  Medicines that increase blood sugar or affect your control of diabetes.  Getting less physical activity.  Overeating.  Being sick or injured or having an infection.  Having surgery.  Stress.  Not giving yourself enough insulin (if you are taking it). You may have high blood sugar because you have diabetes that has not been diagnosed yet. If you do not have diabetes, high blood sugar may be caused by:  Certain medicines.  Stress.  A bad illness.  An infection.  Having surgery.  Diseases of the pancreas. What increases the risk? This condition is more likely to develop in people who have risk factors for diabetes, such as:  Having a family member with diabetes.  Certain conditions in which the body's defense system (immune system) attacks itself. These are called autoimmune disorders.  Being overweight.  Not being active.  Having a condition called insulin resistance.  Having a history of: ? Prediabetes. ? Diabetes when pregnant. ? Polycystic ovarian syndrome (PCOS). What are the signs or symptoms? This condition may not cause symptoms. If you do have symptoms, they may include:  Feeling more thirsty than normal.  Needing to pee (urinate) more often than normal.  Hunger.  Feeling very tired.  Blurry eyesight (vision). You may get other symptoms as the condition gets worse, such as:  Dry mouth.  Pain in your belly (abdomen).  Not being hungry (loss of appetite).  Breath that smells fruity.  Weakness.  Weight loss that is not planned.  A tingling or numb feeling in your hands or feet.  A headache.  Cuts or bruises that heal slowly. How is this  treated? Treatment depends on the cause of your condition. Treatment may include:  Taking medicine to control your blood sugar levels.  Changing your medicine or dosage if you take insulin or other diabetes medicines.  Lifestyle changes. These may include: ? Exercising more. ? Eating healthier foods. ? Losing weight.  Treating an illness or infection.  Checking your blood sugar more often.  Stopping or reducing steroid medicines. If your condition gets very bad, you will need to be treated in the hospital. Follow these instructions at home: General instructions  Take over-the-counter and prescription medicines only as told by your doctor.  Do not smoke or use any products that contain nicotine or tobacco. If you need help quitting, ask your doctor.  If you drink alcohol: ? Limit how much you have to:  0-1 drink a day for women who are not pregnant.  0-2 drinks a day for men. ? Know how much alcohol is in a drink. In the [place] one drink equals one 12 oz bottle of beer (355 mL), one 5 oz glass of wine (148 mL), or one 1 oz glass of hard liquor (44 mL).  Manage stress. If you need help with this, ask your doctor.  Do exercises as told by your doctor.  Keep all follow-up visits. Eating and drinking  Stay at a healthy weight.  Make sure you drink enough fluid when you: ? Exercise. ? Get sick. ? Are in hot temperatures.  Drink enough fluid to keep your pee (urine) pale yellow.   If you  have diabetes:  Know the symptoms of high blood sugar.  Follow your diabetes management plan as told by your doctor. Make sure you: ? Take insulin and medicines as told. ? Follow your exercise plan. ? Follow your meal plan. Eat on time. Do not skip meals. ? Check your blood sugar as often as told. Make sure you check before and after exercise. If you exercise longer or in a different way, check your blood sugar more often. ? Follow your sick day plan whenever you cannot eat or drink  normally. Make this plan ahead of time with your doctor.  Share your diabetes management plan with people in your workplace, school, and household.  Check your pee for ketones when you are ill and as told by your doctor.  Carry a card or wear jewelry that says that you have diabetes.   Where to find more information American Diabetes Association: www.diabetes.org Contact a doctor if:  Your blood sugar level is at or above 240 mg/dL (13.3 mmol/L) for 2 days in a row.  You have problems keeping your blood sugar in your target range.  You have high blood pressure often.  You have signs of illness, such as: ? Feeling like you may vomit (feeling nauseous). ? Vomiting. ? A fever. Get help right away if:  Your blood sugar monitor reads "high" even when you are taking insulin.  You have trouble breathing.  You have a change in how you think, feel, or act (mental status).  You feel like you may vomit, and the feeling does not go away.  You cannot stop vomiting. These symptoms may be an emergency. Get medical help right away. Call your local emergency services (911 in the [place]  Do not wait to see if the symptoms will go away.  Do not drive yourself to the hospital. Summary  Hyperglycemia is when the sugar (glucose) level in your blood is too high.  High blood sugar can happen to people who have or do not have diabetes.  Make sure you drink enough fluids and follow your meal plan. Exercise as often as told by your doctor.  Contact your doctor if you have problems keeping your blood sugar in your target range. This information is not intended to replace advice given to you by your health care provider. Make sure you discuss any questions you have with your health care provider. Document Revised: 08/19/2020 Document Reviewed: 08/19/2020 Elsevier Patient Education  2021 [employer].

## 2020-12-22 NOTE — Progress Notes (Signed)
Patient ID: [name], female    DOB: 10/02/1966, 55 y.o.   MRN: [id number]   Chief Complaint  Patient presents with  . Diabetes   Subjective:  CC: elevated blood sugars  This is a new problem.  Presents today with a concern of elevated blood sugars.  Has been taking Celexa for her depression, recently started on Paxil in the last month.  She reports that she was not feeling well so she started taking her blood sugars, her blood sugar has been ranged from 267-331 in the last several days.  This is unusual for her as her blood sugars do not usually range that high, her last A1c was 6.1.  She wonders if it is the Paxil that is causing the elevation in blood sugar.  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  She reports that she is more thirsty and urinating more with her elevated blood sugars.  She is compliant with her diabetic medication, the only change is the new addition of Paxil.   started feeling bad last week and checked sugar and it was elevated. States she started fish oil and paxil around the same time she started having high sugars. Also having nausea around the same time.  Results for orders placed or performed in visit on 12/22/20  POCT glycosylated hemoglobin (Hb A1C)  Result Value Ref Range   Hemoglobin A1C 5.5 4.0 - 5.6 %   HbA1c POC (<> result, manual entry)     HbA1c, POC (prediabetic range)     HbA1c, POC (controlled diabetic range)        Medical History [name] has a past medical history of Anxiety, Blood transfusion without reported diagnosis (1999), Breast cancer ([place]) (05/22/2011), DDD (degenerative disc disease), lumbar, Depression (06/22/2011), Diverticulitis, DM (diabetes mellitus) ([place]) (06/22/2011), Genital warts, GERD (gastroesophageal reflux disease) (06/22/2011), adenomatous colonic polyps (10/2007),  Hypercholesterolemia (06/22/2011), Hypertension (06/22/2011), 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   Outpatient Encounter Medications as of 12/22/2020  Medication Sig  . albuterol (VENTOLIN HFA) 108 (90 Base) MCG/ACT inhaler Inhale 1-2 puffs into the lungs every 6 (six) hours as needed for wheezing or shortness of breath.  Marland Kitchen atorvastatin (LIPITOR) 40 MG tablet TAKE 1 TABLET BY MOUTH AT BEDTIME  . blood glucose meter kit and supplies KIT Dispense based on patient and insurance preference. TEST ONCE DAILY. DIABETES TYPE 2 ICD 10 CODE E11.9  . blood glucose meter kit and supplies Dispense based on patient and insurance preference. Use up to four times daily as directed. (FOR ICD-10 E10.9, E11.9).  . cetirizine (ZYRTEC ALLERGY) 10 MG tablet Take 1 tablet (10 mg total) by mouth daily.  . citalopram (CELEXA) 40 MG tablet Take 1 tablet (40 mg total) by mouth daily.  . cyclobenzaprine (FLEXERIL) 10 MG tablet Take 1 tablet (10 mg total) by mouth 2 (two) times daily as needed for muscle spasms.  . Dulaglutide (TRULICITY) 5.63 JS/9.7WY SOPN Inject 0.75 mg subcu once weekly  . ergocalciferol (VITAMIN D2) 1.25 MG (50000 UT) capsule Take 1 capsule (50,000 Units total) by mouth once a week.  . fluticasone (FLONASE) 50 MCG/ACT nasal spray Place 1 spray into both nostrils daily for 7 days.  Marland Kitchen lisinopril (ZESTRIL) 10 MG tablet Take  1 tablet (10 mg total) by mouth daily.  Marland Kitchen LORazepam (ATIVAN) 0.5 MG tablet Take 1 tablet (0.5 mg total) by mouth 2 (two) times daily as needed for anxiety.  . metFORMIN (GLUCOPHAGE-XR) 750 MG 24 hr tablet Take 1 tablet (750 mg total) by mouth daily with breakfast.  . ondansetron (ZOFRAN ODT) 4 MG disintegrating tablet Take 1 tablet (4 mg total) by mouth every 8 (eight) hours as needed for nausea or vomiting.  . pantoprazole  (PROTONIX) 40 MG tablet Take 1 tablet (40 mg total) by mouth every morning.  . [DISCONTINUED] benzonatate (TESSALON) 100 MG capsule Take 1 capsule (100 mg total) by mouth every 8 (eight) hours.  . [DISCONTINUED] ondansetron (ZOFRAN ODT) 4 MG disintegrating tablet Take 1 tablet (4 mg total) by mouth every 8 (eight) hours as needed for nausea or vomiting.  . [DISCONTINUED] PARoxetine (PAXIL) 10 MG tablet Take 1 tab p.o. daily with the 58m tablet for 14 days.  . [DISCONTINUED] PARoxetine (PAXIL) 20 MG tablet Take one tablet po daily  . [DISCONTINUED] PARoxetine (PAXIL) 40 MG tablet Take 1 tablet (40 mg total) by mouth every morning.   No facility-administered encounter medications on file as of 12/22/2020.     Review of Systems  Constitutional: Negative for chills and fever.  HENT: Negative for congestion and sore throat.   Respiratory: Positive for cough and shortness of breath.        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  Cardiovascular: Negative for chest pain.  Gastrointestinal: Positive for abdominal pain and nausea. Negative for diarrhea and vomiting.       Not new abdominal pain.  Nausea is new since last 2 weeks.   Endocrine: Positive for polydipsia and polyuria.       Blood sugars elevated.     Vitals BP 110/70   Pulse 94   Temp (!) 97 F (36.1 C)   Ht 5' 5" (1.651 m)   Wt 241 lb (109.3 kg)   LMP 04/05/2016 Comment: spotting 11/2016 and 12/2016  SpO2 97%   BMI 40.10 kg/m   Objective:   Physical Exam Vitals reviewed.  Constitutional:      General: She is not in acute distress.    Appearance: Normal appearance.  Cardiovascular:     Rate and Rhythm: Normal rate and regular rhythm.     Heart sounds: Normal heart sounds.  Pulmonary:     Effort: Pulmonary effort is normal.     Breath sounds: Normal breath sounds.  Skin:    General: Skin is warm and dry.  Neurological:     General: No focal deficit present.     Mental Status: She is  alert.  Psychiatric:        Behavior: Behavior normal.      Assessment and Plan   1. Diabetes mellitus without complication [place] - POCT glycosylated hemoglobin (Hb A1C)  2. Type 2 diabetes mellitus with hyperglycemia, without long-term current use of insulin (HCC) - citalopram (CELEXA) 40 MG tablet; Take 1 tablet (40 mg total) by mouth daily.  Dispense: 30 tablet; Refill: 1  3. Major depressive disorder with single episode, in partial remission (HCC) - citalopram (CELEXA) 40 MG tablet; Take 1 tablet (40 mg total) by mouth daily.  Dispense: 30 tablet; Refill: 1  4. Nausea - ondansetron (ZOFRAN ODT) 4 MG disintegrating tablet; Take 1 tablet (4 mg total) by mouth every 8 (eight) hours as needed for nausea or vomiting.  Dispense: 20 tablet; Refill: 0   .  Reports that the elevated blood sugars are causing her to have nausea, will send some Zofran to assist with this.  Will discontinue the Paxil, reorder the Celexa at her previous dose.  She will continue to monitor her blood sugars daily, she will continue to keep a log, and bring this information with her at her next follow-up visit.  Agrees with plan of care discussed today. Understands warning signs to seek further care: chest pain, shortness of breath, any significant change in health.  Understands to follow-up in 1 month to assess blood sugars on Celexa.  She will let us know if she needs to be seen sooner.  She wonders if she qualifies for a different way to monitor her blood sugar.   Pecolia Ades, NP 12/22/2020

## 2020-12-27 ENCOUNTER — Ambulatory Visit: Payer: Self-pay | Admitting: Family Medicine

## 2020-12-28 ENCOUNTER — Encounter: Payer: Self-pay | Admitting: Orthopaedic Surgery

## 2021-01-04 ENCOUNTER — Emergency Department (HOSPITAL_COMMUNITY)
Admission: EM | Admit: 2021-01-04 | Discharge: 2021-01-04 | Disposition: A | Discharge status: Home / Self Care | Payer: 59 | Attending: Emergency Medicine | Admitting: Emergency Medicine

## 2021-01-04 ENCOUNTER — Other Ambulatory Visit: Payer: Self-pay

## 2021-01-04 ENCOUNTER — Encounter (HOSPITAL_COMMUNITY): Payer: Self-pay

## 2021-01-04 ENCOUNTER — Emergency Department (HOSPITAL_COMMUNITY): Payer: 59

## 2021-01-04 DIAGNOSIS — R6883 Chills (without fever): Secondary | ICD-10-CM | POA: Insufficient documentation

## 2021-01-04 DIAGNOSIS — Z8719 Personal history of other diseases of the digestive system: Secondary | ICD-10-CM | POA: Insufficient documentation

## 2021-01-04 DIAGNOSIS — Z7984 Long term (current) use of oral hypoglycemic drugs: Secondary | ICD-10-CM | POA: Diagnosis not present

## 2021-01-04 DIAGNOSIS — R197 Diarrhea, unspecified: Secondary | ICD-10-CM | POA: Insufficient documentation

## 2021-01-04 DIAGNOSIS — R1084 Generalized abdominal pain: Secondary | ICD-10-CM

## 2021-01-04 DIAGNOSIS — Z79899 Other long term (current) drug therapy: Secondary | ICD-10-CM | POA: Diagnosis not present

## 2021-01-04 DIAGNOSIS — R000 Tachycardia, unspecified: Secondary | ICD-10-CM | POA: Diagnosis not present

## 2021-01-04 DIAGNOSIS — R112 Nausea with vomiting, unspecified: Secondary | ICD-10-CM | POA: Diagnosis not present

## 2021-01-04 DIAGNOSIS — I10 Essential (primary) hypertension: Secondary | ICD-10-CM | POA: Insufficient documentation

## 2021-01-04 DIAGNOSIS — E119 Type 2 diabetes mellitus without complications: Secondary | ICD-10-CM | POA: Insufficient documentation

## 2021-01-04 DIAGNOSIS — Z853 Personal history of malignant neoplasm of breast: Secondary | ICD-10-CM | POA: Diagnosis not present

## 2021-01-04 DIAGNOSIS — R1033 Periumbilical pain: Secondary | ICD-10-CM | POA: Diagnosis present

## 2021-01-04 LAB — COMPREHENSIVE METABOLIC PANEL
ALT: 24 U/L (ref 0–44)
AST: 22 U/L (ref 15–41)
Albumin: 3.9 g/dL (ref 3.5–5.0)
Alkaline Phosphatase: 52 U/L (ref 38–126)
Anion gap: 9 (ref 5–15)
BUN: 14 mg/dL (ref 6–20)
CO2: 25 mmol/L (ref 22–32)
Calcium: 8.7 mg/dL — ABNORMAL LOW (ref 8.9–10.3)
Chloride: 103 mmol/L (ref 98–111)
Creatinine, Ser: 0.87 mg/dL (ref 0.44–1.00)
GFR, Estimated: >60 mL/min (ref >60)
Glucose, Bld: 132 mg/dL — ABNORMAL HIGH (ref 70–99)
Potassium: 3.5 mmol/L (ref 3.5–5.1)
Sodium: 137 mmol/L (ref 135–145)
Total Bilirubin: 0.9 mg/dL (ref 0.3–1.2)
Total Protein: 7.5 g/dL (ref 6.5–8.1)

## 2021-01-04 LAB — CBC WITH DIFFERENTIAL/PLATELET
Abs Immature Granulocytes: 0.03 10*3/uL (ref 0.00–0.07)
Basophils Absolute: 0 10*3/uL (ref 0.0–0.1)
Basophils Relative: 0 %
Eosinophils Absolute: 0.1 10*3/uL (ref 0.0–0.5)
Eosinophils Relative: 2 %
HCT: 44.1 % (ref 36.0–46.0)
Hemoglobin: 13.9 g/dL (ref 12.0–15.0)
Immature Granulocytes: 0 %
Lymphocytes Relative: 6 %
Lymphs Abs: 0.5 10*3/uL — ABNORMAL LOW (ref 0.7–4.0)
MCH: 28.2 pg (ref 26.0–34.0)
MCHC: 31.5 g/dL (ref 30.0–36.0)
MCV: 89.5 fL (ref 80.0–100.0)
Monocytes Absolute: 0.4 10*3/uL (ref 0.1–1.0)
Monocytes Relative: 5 %
Neutro Abs: 6.8 10*3/uL (ref 1.7–7.7)
Neutrophils Relative %: 87 %
Platelets: 191 10*3/uL (ref 150–400)
RBC: 4.93 MIL/uL (ref 3.87–5.11)
RDW: 15.4 % (ref 11.5–15.5)
WBC: 7.8 10*3/uL (ref 4.0–10.5)
nRBC: 0 % (ref 0.0–0.2)

## 2021-01-04 LAB — I-STAT BETA HCG BLOOD, ED (MC, WL, AP ONLY): Blood: NEGATIVE

## 2021-01-04 LAB — LIPASE, BLOOD: Lipase: 26 U/L (ref 11–51)

## 2021-01-04 MED ORDER — HYDROMORPHONE HCL 1 MG/ML IJ SOLN
0.5000 mg | Freq: Once | INTRAMUSCULAR | Status: AC
  Administered 2021-01-04: 0.5 mg via INTRAVENOUS
  Filled 2021-01-04: qty 1

## 2021-01-04 MED ORDER — SODIUM CHLORIDE 0.9 % IV BOLUS
1000.0000 mL | Freq: Once | INTRAVENOUS | Status: AC
  Administered 2021-01-04: 1000 mL via INTRAVENOUS

## 2021-01-04 MED ORDER — IOHEXOL 300 MG/ML  SOLN
100.0000 mL | Freq: Once | INTRAMUSCULAR | Status: AC | PRN
  Administered 2021-01-04: 100 mL via INTRAVENOUS

## 2021-01-04 MED ORDER — ONDANSETRON 4 MG PO TBDP: 4.0000 mg | ORAL_TABLET | Freq: Three times a day (TID) | ORAL | 0 refills | Status: DC | PRN

## 2021-01-04 MED ORDER — ONDANSETRON HCL 4 MG/2ML IJ SOLN
4.0000 mg | Freq: Once | INTRAMUSCULAR | Status: AC
  Administered 2021-01-04: 4 mg via INTRAVENOUS
  Filled 2021-01-04: qty 2

## 2021-01-04 MED ORDER — MORPHINE SULFATE (PF) 4 MG/ML IV SOLN
4.0000 mg | Freq: Once | INTRAVENOUS | Status: AC
  Administered 2021-01-04: 4 mg via INTRAVENOUS
  Filled 2021-01-04: qty 1

## 2021-01-04 NOTE — ED Notes (Signed)
Entered room and introduced self to patient. Pt appears to be resting in bed, respirations are even and unlabored with equal chest rise and fall. Bed is locked in the lowest position, side rails x2, call bell within reach. Pt educated on call light use and hourly rounding, verbalized understanding and in agreement at this time. All questions and concerns voiced addressed. Refreshments offered and provided per patient request.  

## 2021-01-04 NOTE — Discharge Instructions (Signed)
Home to rest, encourage low sugar hydrating fluids such as Gatorade 0. Take Zofran as needed as prescribed for nausea and vomiting. Follow-up with your primary care provider if symptoms persist.  Return to the emergency room for worsening or concerning symptoms.

## 2021-01-04 NOTE — ED Provider Notes (Signed)
[facility] EMERGENCY DEPARTMENT Provider Note   CSN: [id number] Arrival date & time: 01/04/21  1217     History Chief Complaint  Patient presents with  . Abdominal Pain    [name] is a 55 y.o. female.  55 year old female presents with complaint of periumbilical abdominal pain with nausea, vomiting, diarrhea onset midnight last night, also reports chills.  No known sick contacts.  Patient states that she had spaghetti for dinner which she thinks is fine however had a chicken sandwich for lunch and thinks it was too greasy for her.  Prior abdominal surgeries include cholecystectomy.  Patient is not anything for her symptoms prior to arrival.  Emesis is nonbilious nonbloody, diarrhea is nonbloody, report is loose stools.  No other complaints or concerns.        Past Medical History:  Diagnosis Date  . Anxiety   . Blood transfusion without reported diagnosis 1999   due to heavy menses  . Breast cancer (HCC) 05/22/2011   03/01/11, Stage 2, s/p lumpectomy, chemo/xrt  . DDD (degenerative disc disease), lumbar   . Depression 06/22/2011  . Diverticulitis   . DM (diabetes mellitus) (HCC) 06/22/2011  . Genital warts   . GERD (gastroesophageal reflux disease) 06/22/2011  . Hx of adenomatous colonic polyps 10/2007   25mm sigmoid tubular adenoma, FH colon cancer, mother in mid-50s  . Hypercholesterolemia 06/22/2011  . Hypertension 06/22/2011  . 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 Iron deficiency anemia 03/25/2015  . Morbid obesity (HCC)   . Shortness of breath   . 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 Vaginal bleeding 03/08/2014    Patient Active Problem List   Diagnosis Date Noted  . Diabetes mellitus without complication (HCC) 12/22/2020  . Nausea 12/22/2020  . Lipoma of right upper extremity 10/27/2020  . 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 Abdominal pain  12/22/2019  . Central adiposity 04/12/2018  . Seasonal affective disorder (HCC) 12/15/2017  . Chest pain 02/27/2017  . 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 Panic disorder 01/09/2017  . Rectal bleeding 09/06/2016  . Abdominal cramping 09/06/2016  . Fatty liver 09/06/2016  . Dehydration 12/15/2015  . Orthostatic dizziness 12/15/2015  . Nausea vomiting and diarrhea 12/15/2015  . Iron deficiency anemia 03/25/2015  . Excessive or frequent menstruation 04/08/2014  . Dysmenorrhea 04/08/2014  . H/O adenomatous polyp of colon 01/24/2012  . FH: colon cancer 01/24/2012  . Esophageal dysphagia 01/24/2012  . Chronic diarrhea 01/24/2012  . Depression 06/22/2011  . GERD (gastroesophageal reflux disease) 06/22/2011  . Type 2 diabetes mellitus with hyperglycemia, without long-term current use of insulin (HCC) 06/22/2011  . Hypercholesterolemia 06/22/2011  . Hypertension 06/22/2011  . Obesity 06/22/2011  . 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 back surg x2    . BREAST LUMPECTOMY Right 2012  . CHOLECYSTECTOMY    . COLONOSCOPY  11/03/07   4-mm sessile polyp removed/small internal hemorrhoids/tubular adenoma, random colon bx negative for microscopic colitis  . COLONOSCOPY WITH PROPOFOL N/A 09/17/2016   Grade 2 hemorrhoids, colonic diverticulosis, ascending colon polyp (sessile serrated adenoma), 5 year surveillance  . COLONOSCOPY, ESOPHAGOGASTRODUODENOSCOPY (EGD) AND ESOPHAGEAL DILATION  01/2012   mild gastritis s/p biopsy. Empiric dilation. Normal duodenum.   Marland Kitchen DILATATION & CURETTAGE/HYSTEROSCOPY WITH  MYOSURE N/A 02/10/2015   Procedure: DILATATION & CURETTAGE/HYSTEROSCOPY WITH MYOSURE;  Surgeon: Nunzio Cobbs, MD;  Location: [facility];  Service: Gynecology;  Laterality: N/A;  . ESOPHAGOGASTRODUODENOSCOPY (EGD) WITH PROPOFOL N/A 01/11/2020   Procedure:  ESOPHAGOGASTRODUODENOSCOPY (EGD) WITH PROPOFOL;  Surgeon: Daneil Dolin, MD;  Location: [facility];  Service: Endoscopy;  Laterality: N/A;  10:30am-office rescheduled 2/22 @ 8:30am  . MALONEY DILATION N/A 01/11/2020   Procedure: Venia Minks DILATION;  Surgeon: Daneil Dolin, MD;  Location: [facility];  Service: Endoscopy;  Laterality: N/A;  . MM BREAST STEREO BX*L*R/S     rt.  Marland Kitchen POLYPECTOMY  09/17/2016   Procedure: POLYPECTOMY;  Surgeon: Daneil Dolin, MD;  Location: [facility];  Service: Endoscopy;;  ascending colon  . PORT-A-CATH REMOVAL  05/26/2012   Procedure: REMOVAL PORT-A-CATH;  Surgeon: Jamesetta So, MD;  Location: [facility];  Service: General;  Laterality: N/A;  Minor Room  . PORTACATH PLACEMENT       OB History    Gravida  4   Para  3   Term  0   Preterm  0   AB  1   Living  3     SAB  0   IAB  0   Ectopic  0   Multiple  0   Live Births  3           Family History  Problem Relation Age of Onset  . Colon cancer Mother        mid-50s, died of metastatic disease  . Cancer Mother        liver  . Coronary artery disease Mother   . Diabetes type I Mother   . Peripheral vascular disease Mother        Carotid disease in her 79s  . Coronary artery disease Father        CAD in his 2s  . Diabetes Father   . Diabetes type I Father   . Hypertension Father   . Bipolar disorder Sister   . Coronary artery disease Brother        MI in his 78s  . Hypertension Brother   . Hypertension Maternal Grandmother   . Hyperlipidemia Maternal Grandmother   . Stroke Maternal Grandmother   . Hypertension Maternal Grandfather   . Diabetes Maternal Grandfather   . Diabetes Paternal Grandmother   . Heart disease Paternal Grandfather     Social History   Tobacco Use  . Smoking status: Never Smoker  . Smokeless tobacco: Never Used  Vaping Use  . Vaping Use: Never used  Substance Use Topics  . Alcohol use: No    Alcohol/week: 0.0 standard drinks  . Drug use:  No    Home Medications Prior to Admission medications   Medication Sig Start Date End Date Taking? Authorizing Provider  ondansetron (ZOFRAN ODT) 4 MG disintegrating tablet Take 1 tablet (4 mg total) by mouth every 8 (eight) hours as needed for nausea or vomiting. 01/04/21  Yes Tacy Learn, PA-C  albuterol (VENTOLIN HFA) 108 (90 Base) MCG/ACT inhaler Inhale 1-2 puffs into the lungs every 6 (six) hours as needed for wheezing or shortness of breath. 09/24/20   Avegno, Darrelyn Hillock, FNP  atorvastatin (LIPITOR) 40 MG tablet TAKE 1 TABLET BY MOUTH AT BEDTIME 11/24/20   Lovena Le, Malena M, DO  blood glucose meter kit and supplies KIT Dispense based on patient and insurance preference. TEST ONCE DAILY. DIABETES TYPE 2 ICD 10  CODE E11.9 08/26/18   Cheyenne Adas, NP  blood glucose meter kit and supplies Dispense based on patient and insurance preference. Use up to four times daily as directed. (FOR ICD-10 E10.9, E11.9). 01/05/20   Mikey Kirschner, MD  cetirizine (ZYRTEC ALLERGY) 10 MG tablet Take 1 tablet (10 mg total) by mouth daily. 10/31/20   Avegno, Darrelyn Hillock, FNP  citalopram (CELEXA) 40 MG tablet Take 1 tablet (40 mg total) by mouth daily. 12/22/20   Chalmers Guest, NP  cyclobenzaprine (FLEXERIL) 10 MG tablet Take 1 tablet (10 mg total) by mouth 2 (two) times daily as needed for muscle spasms. 12/14/20   Faustino Congress, NP  Dulaglutide (TRULICITY) 6.04 VW/0.9WJ SOPN Inject 0.75 mg subcu once weekly 10/05/20   Elvia Collum M, DO  ergocalciferol (VITAMIN D2) 1.25 MG (50000 UT) capsule Take 1 capsule (50,000 Units total) by mouth once a week. 04/28/20   Derek Jack, MD  fluticasone (FLONASE) 50 MCG/ACT nasal spray Place 1 spray into both nostrils daily for 7 days. 10/31/20 11/07/20  Avegno, Darrelyn Hillock, FNP  lisinopril (ZESTRIL) 10 MG tablet Take 1 tablet (10 mg total) by mouth daily. 10/05/20   Lovena Le, Malena M, DO  LORazepam (ATIVAN) 0.5 MG tablet Take 1 tablet (0.5 mg total) by mouth 2 (two)  times daily as needed for anxiety. 10/04/20   Elvia Collum M, DO  metFORMIN (GLUCOPHAGE-XR) 750 MG 24 hr tablet Take 1 tablet (750 mg total) by mouth daily with breakfast. 10/05/20   Lovena Le, Malena M, DO  pantoprazole (PROTONIX) 40 MG tablet Take 1 tablet (40 mg total) by mouth every morning. 10/05/20   Erven Colla, DO    Allergies    Flagyl [metronidazole], Lansoprazole, and Pravastatin  Review of Systems   Review of Systems  Constitutional: Positive for chills. Negative for fever.  Respiratory: Negative for shortness of breath.   Gastrointestinal: Positive for abdominal pain, diarrhea, nausea and vomiting. Negative for blood in stool and constipation.  Genitourinary: Negative for dysuria and frequency.  Musculoskeletal: Negative for arthralgias and myalgias.  Skin: Negative for rash.  Allergic/Immunologic: Positive for immunocompromised state.  Neurological: Negative for weakness.  All other systems reviewed and are negative.   Physical Exam Updated Vital Signs BP 110/65 (BP Location: Left Arm)   Pulse 94   Temp 98.3 F (36.8 C) (Oral)   Resp 15   Ht $R'5\' 4"'vI$  (1.626 m)   Wt 106.6 kg   LMP 04/05/2016 Comment: spotting 11/2016 and 12/2016  SpO2 96%   BMI 40.34 kg/m   Physical Exam Vitals and nursing note reviewed.  Constitutional:      General: She is not in acute distress.    Appearance: She is well-developed and well-nourished. She is not diaphoretic.  HENT:     Head: Normocephalic and atraumatic.  Cardiovascular:     Rate and Rhythm: Regular rhythm. Tachycardia present.  Pulmonary:     Effort: Pulmonary effort is normal.     Breath sounds: Normal breath sounds.  Abdominal:     Palpations: Abdomen is soft.     Tenderness: There is abdominal tenderness in the periumbilical area.  Skin:    General: Skin is warm and dry.     Findings: No erythema or rash.  Neurological:     Mental Status: She is alert and oriented to person, place, and time.  Psychiatric:         Mood and Affect: Mood and affect normal.        Behavior:  Behavior normal.     ED Results / Procedures / Treatments   Labs (all labs ordered are listed, but only abnormal results are displayed) Labs Reviewed  CBC WITH DIFFERENTIAL/PLATELET - Abnormal; Notable for the following components:      Result Value   Lymphs Abs 0.5 (*)    All other components within normal limits  COMPREHENSIVE METABOLIC PANEL - Abnormal; Notable for the following components:   Glucose, Bld 132 (*)    Calcium 8.7 (*)    All other components within normal limits  I-STAT BETA HCG BLOOD, ED ([facility], [facility], [facility] ONLY) - Normal  LIPASE, BLOOD  URINALYSIS, ROUTINE W REFLEX MICROSCOPIC    EKG None  Radiology CT Abdomen Pelvis W Contrast  Result Date: 01/04/2021 CLINICAL DATA:  Nonlocalized abdominal pain. Nausea vomiting and diarrhea. EXAM: CT ABDOMEN AND PELVIS WITH CONTRAST TECHNIQUE: Multidetector CT imaging of the abdomen and pelvis was performed using the standard protocol following bolus administration of intravenous contrast. CONTRAST:  138mL OMNIPAQUE IOHEXOL 300 MG/ML  SOLN COMPARISON:  05/07/2020. FINDINGS: Lower chest: Unremarkable. Hepatobiliary: No suspicious focal abnormality within the liver parenchyma. Gallbladder is surgically absent. Mild distention of the common bile duct is stable, likely related to prior cholecystectomy. Pancreas: No focal mass lesion. No dilatation of the main duct. No intraparenchymal cyst. No peripancreatic edema. Spleen: No splenomegaly. No focal mass lesion. Adrenals/Urinary Tract: No adrenal nodule or mass. Punctate nonobstructing stones are seen in each kidney, best appreciated on coronal imaging. Cortical scarring noted both kidneys with potential underlying component of persistent fetal lobation no suspicious renal mass lesion or hydronephrosis. No evidence for hydroureter. The urinary bladder appears normal for the degree of distention. Stomach/Bowel: Tiny hiatal hernia. Stomach  otherwise unremarkable. Duodenum is normally positioned as is the ligament of Treitz. Duodenal diverticulum noted. No small bowel wall thickening. No small bowel dilatation. The terminal ileum is normal. The appendix is not well visualized, but there is no edema or inflammation in the region of the cecum. Fluid is seen along the length of the colon, including in the rectum, compatible with the reported clinical history of diarrhea. Vascular/Lymphatic: There is abdominal aortic atherosclerosis without aneurysm. There is no gastrohepatic or hepatoduodenal ligament lymphadenopathy. No retroperitoneal or mesenteric lymphadenopathy. No pelvic sidewall lymphadenopathy. Reproductive: The uterus is unremarkable.  There is no adnexal mass. Other: No intraperitoneal free fluid. Musculoskeletal: No worrisome lytic or sclerotic osseous abnormality. IMPRESSION: 1. Intraluminal fluid is seen along the length of the colon, including in the rectum, compatible with the reported clinical history of diarrhea. 2. Punctate nonobstructing bilateral renal stones. 3. Tiny hiatal hernia. 4. Aortic Atherosclerosis (ICD10-I70.0). Electronically Signed   By: Misty Stanley M.D.   On: 01/04/2021 15:28    Procedures Procedures   Medications Ordered in ED Medications  sodium chloride 0.9 % bolus 1,000 mL (1,000 mLs Intravenous New Bag/Given 01/04/21 1253)  ondansetron (ZOFRAN) injection 4 mg (4 mg Intravenous Given 01/04/21 1254)  morphine 4 MG/ML injection 4 mg (4 mg Intravenous Given 01/04/21 1257)  iohexol (OMNIPAQUE) 300 MG/ML solution 100 mL (100 mLs Intravenous Contrast Given 01/04/21 1419)  HYDROmorphone (DILAUDID) injection 0.5 mg (0.5 mg Intravenous Given 01/04/21 1521)  ondansetron (ZOFRAN) injection 4 mg (4 mg Intravenous Given 01/04/21 1521)    ED Course  I have reviewed the triage vital signs and the nursing notes.  Pertinent labs & imaging results that were available during my care of the patient were reviewed by me and  considered in my medical decision making (  see chart for details).  Clinical Course as of 01/04/21 1616  Wed Jan 04, 2021  1321 I was directly involved in this patients medical care.  [JH]  [id number] 68-year-old female with complaint of nausea, vomiting, diarrhea abdominal pain onset midnight.  On exam found to have generalized abdominal tenderness.  Labs without significant findings including CBC, CMP, UA lipase within normal limits, hCG negative.  Urinalysis not provided however denies urinary symptoms.  CT without significant findings, does show likely diarrheal process.  On recheck, patient is feeling better, will p.o. challenge prior to discharge with prescription for Zofran, plan follow-up PCP, return to the ED as needed. [LM]    Clinical Course User Index [JH] [place], Greggory Brandy, MD [LM] Roque Lias   MDM Rules/Calculators/A&P                          Final Clinical Impression(s) / ED Diagnoses Final diagnoses:  Generalized abdominal pain  Nausea vomiting and diarrhea    Rx / DC Orders ED Discharge Orders         Ordered    ondansetron (ZOFRAN ODT) 4 MG disintegrating tablet  Every 8 hours PRN        01/04/21 1613           Tacy Learn, PA-C 01/04/21 1616    Luna Fuse, MD 01/07/21 [id number]

## 2021-01-04 NOTE — ED Triage Notes (Signed)
Pt to er, pt states that she has been having vomiting and diarrhea since last night, states that she is having some abd pain.  Pt states that no one else at home is sick.  Pt reports umbilical pain.

## 2021-01-04 NOTE — ED Notes (Signed)
Patient transported to CT 

## 2021-01-04 NOTE — ED Notes (Signed)
Ginger ale provided to the patient.

## 2021-01-05 ENCOUNTER — Telehealth: Payer: Self-pay

## 2021-01-05 NOTE — Telephone Encounter (Signed)
Please advise. Thank you

## 2021-01-05 NOTE — Telephone Encounter (Signed)
Transition Care Management Unsuccessful Follow-up Telephone Call  Date of discharge and from where:  01/04/2021 from [facility]  Attempts:  1st Attempt  Reason for unsuccessful TCM follow-up call:  Left voice message

## 2021-01-05 NOTE — Telephone Encounter (Signed)
Pt was at ER yesterday they said she has a 48 hour bug she is throwing up nausea,vomiting and extremely weak and wants to know if Dr Lovena Le can give her a extended work note till Monday so she can recover. Hospital said to get in touch with [place] to get extended work note.   Pt call back [phone]

## 2021-01-05 NOTE — Telephone Encounter (Signed)
    Transition Care Management Follow-up Telephone Call  Date of discharge and from where: 01/04/2021 from [facility]  How have you been since you were released from the hospital? Pt is still feeling poorly.   Any questions or concerns? No  Items Reviewed:  Did the pt receive and understand the discharge instructions provided? Yes   Medications obtained and verified? Yes   Other? No   Any new allergies since your discharge? No   Dietary orders reviewed? N/A  Do you have support at home? Yes   Functional Questionnaire: (I = Independent and D = Dependent) ADLs: I  Bathing/Dressing- I  Meal Prep- I  Eating- I  Maintaining continence- I  Transferring/Ambulation- I  Managing Meds- I  Follow up appointments reviewed:   PCP Hospital f/u appt confirmed? No  Scheduled to see Elvia Collum, DO on 01/12/2021 @ 02:50pm.  Are transportation arrangements needed? No  If their condition worsens, is the pt aware to call PCP or go to the Emergency Dept.? Yes Was the patient provided with contact information for the PCP's office or ED? Yes Was to pt encouraged to call back with questions or concerns? Yes

## 2021-01-06 ENCOUNTER — Encounter: Payer: Self-pay | Admitting: Family Medicine

## 2021-01-06 NOTE — Telephone Encounter (Signed)
Please go ahead and give appropriate work note may return to work on Monday if not doing better by then I would recommend a recheck thank you

## 2021-01-06 NOTE — Telephone Encounter (Signed)
Pt is calling to check on note

## 2021-01-12 ENCOUNTER — Ambulatory Visit (INDEPENDENT_AMBULATORY_CARE_PROVIDER_SITE_OTHER): Payer: 59 | Admitting: Family Medicine

## 2021-01-12 ENCOUNTER — Other Ambulatory Visit: Payer: Self-pay

## 2021-01-12 VITALS — BP 134/86 | HR 86 | Temp 97.2°F | Ht 64.0 in | Wt 241.0 lb

## 2021-01-12 DIAGNOSIS — E119 Type 2 diabetes mellitus without complications: Secondary | ICD-10-CM | POA: Diagnosis not present

## 2021-01-12 DIAGNOSIS — R110 Nausea: Secondary | ICD-10-CM

## 2021-01-12 DIAGNOSIS — N390 Urinary tract infection, site not specified: Secondary | ICD-10-CM

## 2021-01-12 DIAGNOSIS — R109 Unspecified abdominal pain: Secondary | ICD-10-CM

## 2021-01-12 LAB — POCT URINALYSIS DIPSTICK
Nitrite, UA: POSITIVE
Spec Grav, UA: <=1.005 — AB (ref 1.010–1.025)
pH, UA: 5 (ref 5.0–8.0)

## 2021-01-12 LAB — POCT GLUCOSE (DEVICE FOR HOME USE): POC Glucose: 114 mg/dl — AB (ref 70–99)

## 2021-01-12 MED ORDER — FREESTYLE LIBRE 14 DAY READER DEVI: 0 refills | Status: DC

## 2021-01-12 MED ORDER — CEPHALEXIN 500 MG PO CAPS
500.0000 mg | ORAL_CAPSULE | Freq: Two times a day (BID) | ORAL | 0 refills | Status: DC
Start: 2021-01-12 — End: 2021-02-23

## 2021-01-12 MED ORDER — ONDANSETRON 4 MG PO TBDP
4.0000 mg | ORAL_TABLET | Freq: Three times a day (TID) | ORAL | 0 refills | Status: DC | PRN
Start: 2021-01-12 — End: 2021-01-31

## 2021-01-12 MED ORDER — FREESTYLE LIBRE 14 DAY SENSOR MISC: 3 refills | Status: DC

## 2021-01-12 NOTE — Progress Notes (Signed)
Patient ID: ANISIA LEIJA, female    DOB: 03-10-1966, 55 y.o.   MRN: [id number]   Chief Complaint  Patient presents with  . Abdominal Pain   Subjective:    HPI  pt went to ED for abdominal pain 01/04/21 and feeling better but concerned about having nausea for months. Takes zofran.  Ct abd done- fluid in abd near colon, compatible with diarrhea infection.  Last week vomiting a lot.  nausea constantly. Going for last 2-3 months. Went to er last week and said gastroenteritis with vomiting and diarrhea.  Husband and son had vomiting only 2 days after.   cholecystecomy already H/o dm2- Having issue with bg running high 250-300s.   Taking protonix for gerd.  Ct scan - not showing much.  Eating and drinking still.   Last night 247 glucose before bed.  Taking zofran daily 1x per day, some times bid.  H/o rt breast cancer 2012, surgery, with chemo and then radiation. Seeing oncology yearly now.  Hasn't checked bg yet tdays. Having in frequency. No dysuria.  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), Shortness of breath, STD (sexually transmitted disease), and Vaginal bleeding (03/08/2014).   Outpatient Encounter Medications as of 01/12/2021  Medication Sig  . atorvastatin (LIPITOR) 40 MG tablet TAKE 1 TABLET BY MOUTH AT BEDTIME  . blood glucose meter kit and supplies KIT Dispense based on patient and insurance preference. TEST ONCE DAILY. DIABETES TYPE 2 ICD 10 CODE E11.9  . blood  glucose meter kit and supplies Dispense based on patient and insurance preference. Use up to four times daily as directed. (FOR ICD-10 E10.9, E11.9).  . cephALEXin (KEFLEX) 500 MG capsule Take 1 capsule (500 mg total) by mouth 2 (two) times daily.  . citalopram (CELEXA) 40 MG tablet Take 1 tablet (40 mg total) by mouth daily.  . Continuous Blood Gluc Receiver (FREESTYLE LIBRE 14 DAY READER) DEVI Use as directed.  . Continuous Blood Gluc Sensor (FREESTYLE LIBRE 14 DAY SENSOR) MISC Use as directed  . cyclobenzaprine (FLEXERIL) 10 MG tablet Take 1 tablet (10 mg total) by mouth 2 (two) times daily as needed for muscle spasms.  . ergocalciferol (VITAMIN D2) 1.25 MG (50000 UT) capsule Take 1 capsule (50,000 Units total) by mouth once a week.  . fluticasone (FLONASE) 50 MCG/ACT nasal spray Place 1 spray into both nostrils daily for 7 days.  Marland Kitchen lisinopril (ZESTRIL) 10 MG tablet Take 1 tablet (10 mg total) by mouth daily.  Marland Kitchen LORazepam (ATIVAN) 0.5 MG tablet Take 1 tablet (0.5 mg total) by mouth 2 (two) times daily as needed for anxiety.  . metFORMIN (GLUCOPHAGE-XR) 750 MG 24 hr tablet Take 1 tablet (750 mg total) by mouth daily with breakfast.  . pantoprazole (PROTONIX) 40 MG tablet Take 1 tablet (40 mg total) by mouth every morning.  . [DISCONTINUED] Dulaglutide (TRULICITY) 6.38 VF/6.4PP SOPN Inject 0.75 mg subcu once weekly  . [DISCONTINUED] ondansetron (ZOFRAN ODT) 4 MG disintegrating tablet Take 1 tablet (4 mg total) by mouth every 8 (eight) hours as needed for nausea or vomiting.  . ondansetron (ZOFRAN ODT) 4 MG disintegrating tablet Take 1 tablet (4 mg total) by mouth  every 8 (eight) hours as needed for nausea or vomiting.  . [DISCONTINUED] albuterol (VENTOLIN HFA) 108 (90 Base) MCG/ACT inhaler Inhale 1-2 puffs into the lungs every 6 (six) hours as needed for wheezing or shortness of breath.  . [DISCONTINUED] cetirizine (ZYRTEC ALLERGY) 10 MG tablet Take 1 tablet (10 mg total) by mouth daily.   No  facility-administered encounter medications on file as of 01/12/2021.     Review of Systems  Constitutional: Negative for chills and fever.  HENT: Negative for congestion, rhinorrhea and sore throat.   Respiratory: Negative for cough, shortness of breath and wheezing.   Cardiovascular: Negative for chest pain and leg swelling.  Gastrointestinal: Negative for abdominal pain, diarrhea, nausea and vomiting.  Genitourinary: Negative for dysuria and frequency.  Musculoskeletal: Negative for arthralgias and back pain.  Skin: Negative for rash.  Neurological: Negative for dizziness, weakness and headaches.     Vitals BP 134/86   Pulse 86   Temp (!) 97.2 F (36.2 C)   Ht $R'5\' 4"'xp$  (1.626 m)   Wt 241 lb (109.3 kg)   LMP 04/05/2016 Comment: spotting 11/2016 and 12/2016  SpO2 97%   BMI 41.37 kg/m   Objective:   Physical Exam Vitals and nursing note reviewed.  Constitutional:      General: She is not in acute distress.    Appearance: Normal appearance. She is well-developed. She is obese. She is not ill-appearing.  HENT:     Head: Normocephalic and atraumatic.     Nose: Nose normal.     Mouth/Throat:     Mouth: Mucous membranes are moist.     Pharynx: Oropharynx is clear.  Eyes:     Extraocular Movements: Extraocular movements intact.     Conjunctiva/sclera: Conjunctivae normal.     Pupils: Pupils are equal, round, and reactive to light.  Cardiovascular:     Rate and Rhythm: Normal rate and regular rhythm.     Pulses: Normal pulses.     Heart sounds: Normal heart sounds.  Pulmonary:     Effort: Pulmonary effort is normal.     Breath sounds: Normal breath sounds. No wheezing, rhonchi or rales.  Abdominal:     General: Bowel sounds are normal. There is no distension.     Palpations: Abdomen is soft. There is no shifting dullness, hepatomegaly, splenomegaly or mass.     Tenderness: There is no abdominal tenderness. There is no guarding or rebound. Negative signs include Murphy's  sign and McBurney's sign.     Hernia: No hernia is present.  Musculoskeletal:        General: Normal range of motion.     Right lower leg: No edema.     Left lower leg: No edema.  Skin:    General: Skin is warm and dry.     Findings: No lesion or rash.  Neurological:     General: No focal deficit present.     Mental Status: She is alert and oriented to person, place, and time.  Psychiatric:        Mood and Affect: Mood normal.        Behavior: Behavior normal.      Assessment and Plan   1. Diabetes mellitus without complication ([place]) - POCT urinalysis dipstick - POCT Glucose (Device for Home Use) - CMP14+EGFR - Hemoglobin A1c - Continuous Blood Gluc Receiver (FREESTYLE LIBRE 14 DAY READER) DEVI; Use as directed.  Dispense: 1 each; Refill: 0 - Continuous Blood Gluc Sensor (FREESTYLE LIBRE 14 DAY SENSOR) MISC;  Use as directed  Dispense: 1 each; Refill: 3 - Specimen status report  2. Urinary tract infection without hematuria, site unspecified - Urine Culture - cephALEXin (KEFLEX) 500 MG capsule; Take 1 capsule (500 mg total) by mouth 2 (two) times daily.  Dispense: 14 capsule; Refill: 0  3. Abdominal pain, unspecified abdominal location  4. Nausea - ondansetron (ZOFRAN ODT) 4 MG disintegrating tablet; Take 1 tablet (4 mg total) by mouth every 8 (eight) hours as needed for nausea or vomiting.  Dispense: 20 tablet; Refill: 0   abd pain-improved.  Still having some nausea. zofran given prn.  Call or rto if worsening pain or vomiting.  Dm2-  Ordered freestyle libre for pt to see if she can get CGM. a1c - 6.0.  UTI- kelfex for 7 days.  culture sent.  F/u for nausea/dm2, and uti.  F/u 3 wks.

## 2021-01-13 ENCOUNTER — Other Ambulatory Visit: Payer: Self-pay | Admitting: Family Medicine

## 2021-01-13 ENCOUNTER — Telehealth: Payer: Self-pay | Admitting: Family Medicine

## 2021-01-13 LAB — HEMOGLOBIN A1C
Est. average glucose Bld gHb Est-mCnc: 126 mg/dL
Hgb A1c MFr Bld: 6 % — ABNORMAL HIGH (ref 4.8–5.6)

## 2021-01-13 LAB — CMP14+EGFR
ALT: 15 [IU]/L (ref 0–32)
AST: 17 [IU]/L (ref 0–40)
Albumin/Globulin Ratio: 1.5 (ref 1.2–2.2)
Albumin: 4.3 g/dL (ref 3.8–4.9)
Alkaline Phosphatase: 81 [IU]/L (ref 44–121)
BUN/Creatinine Ratio: 9 (ref 9–23)
BUN: 8 mg/dL (ref 6–24)
Bilirubin Total: 0.3 mg/dL (ref 0.0–1.2)
CO2: 26 mmol/L (ref 20–29)
Calcium: 9.2 mg/dL (ref 8.7–10.2)
Chloride: 100 mmol/L (ref 96–106)
Creatinine, Ser: 0.93 mg/dL (ref 0.57–1.00)
GFR calc Af Amer: 80 mL/min/{1.73_m2}
GFR calc non Af Amer: 69 mL/min/{1.73_m2}
Globulin, Total: 2.9 g/dL (ref 1.5–4.5)
Glucose: 115 mg/dL — ABNORMAL HIGH (ref 65–99)
Potassium: 3.6 mmol/L (ref 3.5–5.2)
Sodium: 143 mmol/L (ref 134–144)
Total Protein: 7.2 g/dL (ref 6.0–8.5)

## 2021-01-13 NOTE — Telephone Encounter (Signed)
Patient needing refill on trulicity 0.5 ml called into [facility] she states completely out

## 2021-01-13 NOTE — Telephone Encounter (Signed)
Pt notified it was sent in this morning

## 2021-01-15 LAB — URINE CULTURE

## 2021-01-15 LAB — SPECIMEN STATUS REPORT

## 2021-01-17 ENCOUNTER — Ambulatory Visit: Payer: 59 | Admitting: Obstetrics and Gynecology

## 2021-01-17 ENCOUNTER — Other Ambulatory Visit: Payer: Self-pay | Admitting: *Deleted

## 2021-01-17 MED ORDER — FLUCONAZOLE 150 MG PO TABS: 150.0000 mg | ORAL_TABLET | Freq: Once | ORAL | 0 refills | Status: AC

## 2021-01-19 ENCOUNTER — Ambulatory Visit: Payer: 59 | Admitting: Family Medicine

## 2021-01-22 ENCOUNTER — Encounter: Payer: Self-pay | Admitting: Family Medicine

## 2021-01-28 ENCOUNTER — Other Ambulatory Visit: Payer: Self-pay | Admitting: Family Medicine

## 2021-01-29 ENCOUNTER — Other Ambulatory Visit: Payer: Self-pay | Admitting: Family Medicine

## 2021-01-29 DIAGNOSIS — R110 Nausea: Secondary | ICD-10-CM

## 2021-01-29 DIAGNOSIS — F324 Major depressive disorder, single episode, in partial remission: Secondary | ICD-10-CM

## 2021-01-29 DIAGNOSIS — E1165 Type 2 diabetes mellitus with hyperglycemia: Secondary | ICD-10-CM

## 2021-02-02 ENCOUNTER — Encounter: Payer: Self-pay | Admitting: Family Medicine

## 2021-02-02 ENCOUNTER — Other Ambulatory Visit: Payer: Self-pay

## 2021-02-02 ENCOUNTER — Ambulatory Visit (INDEPENDENT_AMBULATORY_CARE_PROVIDER_SITE_OTHER): Payer: 59 | Admitting: Family Medicine

## 2021-02-02 VITALS — BP 128/86 | HR 95 | Temp 97.6°F | Ht 64.0 in | Wt 238.6 lb

## 2021-02-02 DIAGNOSIS — R110 Nausea: Secondary | ICD-10-CM

## 2021-02-02 DIAGNOSIS — J302 Other seasonal allergic rhinitis: Secondary | ICD-10-CM | POA: Diagnosis not present

## 2021-02-02 DIAGNOSIS — E1165 Type 2 diabetes mellitus with hyperglycemia: Secondary | ICD-10-CM | POA: Diagnosis not present

## 2021-02-02 MED ORDER — FLUTICASONE PROPIONATE 50 MCG/ACT NA SUSP: 2.0000 | Freq: Every day | NASAL | 2 refills | Status: DC

## 2021-02-02 MED ORDER — CETIRIZINE HCL 10 MG PO TABS: 10.0000 mg | ORAL_TABLET | Freq: Every day | ORAL | 3 refills | Status: DC

## 2021-02-02 NOTE — Progress Notes (Signed)
Patient ID: [name], female    DOB: [birth year], 55 y.o.   MRN: [id number]   Chief Complaint  Patient presents with  . Nausea    Continues Follow up on elevated sugars- sugars running better Pain in legs in back affecting mobility   Subjective:  CC: follow-up on diabetes and pain in legs, chronic nausea  Presents today to follow-up for diabetes, chronic nausea, and pain in back and legs.  Has started using libre freestyle, has not been working for the past 2 days, went to the pharmacy, contacted company and expecting the replacement within 1 to 2 days.  Reports that her highest blood sugar that she has seen has been 115.  Fasting sugars have been 90-105.  Her chronic nausea is not better, has been using Zofran, reports that it has been worse since she got a stomach flu about a month ago.  Chronic back and leg pain, affecting her ability to mobilize, has been using topical Voltaren with some relief, difficult to stand and walk.  Denies fever and chills, reporting seasonal allergies, positive for some shortness of breath which is not new, describing chest pain as a twinge that lasts for 2 seconds, then resolves.  Denies excessive thirst and urination.    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   Outpatient Encounter Medications as of 02/02/2021  Medication Sig  . cetirizine (ZYRTEC) 10 MG tablet  Take 1 tablet (10 mg total) by mouth daily.  . fluticasone (FLONASE) 50 MCG/ACT nasal spray Place 2 sprays into both nostrils daily.  Marland Kitchen atorvastatin (LIPITOR) 40 MG tablet TAKE 1 TABLET BY MOUTH AT BEDTIME  . blood glucose meter kit and supplies Dispense based on patient and insurance preference. Use up to four times daily as directed. (FOR ICD-10 E10.9, E11.9).  . cephALEXin (KEFLEX) 500 MG capsule Take 1 capsule (500 mg total) by mouth 2 (two) times daily.  . citalopram (CELEXA) 40 MG tablet Take 1 tablet by mouth once daily  . Continuous Blood Gluc Receiver (FREESTYLE LIBRE 14 DAY READER) DEVI Use as directed.  . Continuous Blood Gluc Sensor (FREESTYLE LIBRE 14 DAY SENSOR) MISC Use as directed  . cyclobenzaprine (FLEXERIL) 10 MG tablet Take 1 tablet (10 mg total) by mouth 2 (two) times daily as needed for muscle spasms.  . Dulaglutide (TRULICITY) 8.84 ZY/6.0YT SOPN INJECT 0.75 MG SUBCUTANEOUSLY ONCE WEEKLY  . ergocalciferol (VITAMIN D2) 1.25 MG (50000 UT) capsule Take 1 capsule (50,000 Units total) by mouth once a week.  Marland Kitchen lisinopril (ZESTRIL) 10 MG tablet Take 1 tablet (10 mg total) by mouth daily.  . metFORMIN (GLUCOPHAGE-XR) 750 MG 24 hr tablet Take 1 tablet (750 mg total) by mouth daily with breakfast.  . ondansetron (ZOFRAN-ODT) 4 MG disintegrating tablet DISSOLVE 1 TABLET IN MOUTH EVERY 8 HOURS AS NEEDED FOR NAUSEA FOR VOMITING  . pantoprazole (PROTONIX) 40 MG tablet TAKE 1 TABLET BY MOUTH ONCE DAILY IN THE MORNING   No facility-administered encounter medications on file as of 02/02/2021.     Review of Systems  Constitutional: Negative for chills and fever.  HENT: Negative  for congestion.        Having seasonal allergy symptoms.  Respiratory: Positive for shortness of breath.        Not new.  Cardiovascular: Positive for chest pain.       Twinge in left upper chest area- last 2 seconds- resolves.   Gastrointestinal: Negative for abdominal pain.  Endocrine: Negative for polydipsia  and polyuria.  Neurological: Negative for headaches.       Allergies     Vitals BP 128/86   Pulse 95   Temp 97.6 F (36.4 C) (Oral)   Ht $R'5\' 4"'aG$  (1.626 m)   Wt 238 lb 9.6 oz (108.2 kg)   LMP 04/05/2016 Comment: spotting 11/2016 and 12/2016  SpO2 99%   BMI 40.96 kg/m   Objective:   Physical Exam Vitals reviewed.  Constitutional:      Appearance: Normal appearance.  Cardiovascular:     Rate and Rhythm: Normal rate and regular rhythm.     Heart sounds: Normal heart sounds.  Pulmonary:     Effort: Pulmonary effort is normal.     Breath sounds: Normal breath sounds.  Skin:    General: Skin is warm and dry.  Neurological:     General: No focal deficit present.     Mental Status: She is alert.  Psychiatric:        Behavior: Behavior normal.      Assessment and Plan   1. Chronic nausea - Ambulatory referral to Gastroenterology  2. Seasonal allergies - cetirizine (ZYRTEC) 10 MG tablet; Take 1 tablet (10 mg total) by mouth daily.  Dispense: 30 tablet; Refill: 3 - fluticasone (FLONASE) 50 MCG/ACT nasal spray; Place 2 sprays into both nostrils daily.  Dispense: 16 g; Refill: 2  3. Type 2 diabetes mellitus with hyperglycemia, without long-term current use of insulin (HCC)   Chronic nausea: Agrees for gastroenterology referral for chronic nausea.  Has Zofran at home.    Seasonal allergies:  Zyrtec and Flonase prescribed.  Type 2 diabetes: expecting new libre freestyle in the next 1 to 2 days, blood sugars have improved.  Fasting sugars 90-105.  Chronic back and leg pain: Will try Tylenol, instructed not to go over 3000 mg in 24 hours, will continue with topical Voltaren, and will get the local pain patch for nighttime.  If symptoms persist, will consider referrals for pain management.  Has tried physical therapy and steroid injections in the past with minimal relief.  Has a history of 2 back surgeries in 2002, has degenerative disc disease.  Agrees with plan of care  discussed today. Understands warning signs to seek further care: chest pain, shortness of breath, any significant change in health.  Understands to follow-up if symptoms do not improve, or worsen.  Will follow up with Dr. Lovena Le for diabetes.    Pecolia Ades, NP 02/02/21

## 2021-02-02 NOTE — Patient Instructions (Signed)
Tylenol as tolerated for back pain- do not go over 3000 mg in 24 hours. Use local pain patches for pain on back and hips.    Chronic Back Pain When back pain lasts longer than 3 months, it is called chronic back pain. The cause of your back pain may not be known. Some common causes include:  Wear and tear (degenerative disease) of the bones, ligaments, or disks in your back.  Inflammation and stiffness in your back (arthritis). People who have chronic back pain often go through certain periods in which the pain is more intense (flare-ups). Many people can learn to manage the pain with home care. Follow these instructions at home: Pay attention to any changes in your symptoms. Take these actions to help with your pain: Managing pain and stiffness  If directed, apply ice to the painful area. Your health care provider may recommend applying ice during the first 24-48 hours after a flare-up begins. To do this: ? Put ice in a plastic bag. ? Place a towel between your skin and the bag. ? Leave the ice on for 20 minutes, 2-3 times per day.  If directed, apply heat to the affected area as often as told by your health care provider. Use the heat source that your health care provider recommends, such as a moist heat pack or a heating pad. ? Place a towel between your skin and the heat source. ? Leave the heat on for 20-30 minutes. ? Remove the heat if your skin turns bright red. This is especially important if you are unable to feel pain, heat, or cold. You may have a greater risk of getting burned.  Try soaking in a warm tub.      Activity  Avoid bending and other activities that make the problem worse.  Maintain a proper position when standing or sitting: ? When standing, keep your upper back and neck straight, with your shoulders pulled back. Avoid slouching. ? When sitting, keep your back straight and relax your shoulders. Do not round your shoulders or pull them backward.  Do not sit or  stand in one place for long periods of time.  Take brief periods of rest throughout the day. This will reduce your pain. Resting in a lying or standing position is usually better than sitting to rest.  When you are resting for longer periods, mix in some mild activity or stretching between periods of rest. This will help to prevent stiffness and pain.  Get regular exercise. Ask your health care provider what activities are safe for you.  Do not lift anything that is heavier than 10 lb (4.5 kg), or the limit that you are told, until your health care provider says that it is safe. Always use proper lifting technique, which includes: ? Bending your knees. ? Keeping the load close to your body. ? Avoiding twisting.  Sleep on a firm mattress in a comfortable position. Try lying on your side with your knees slightly bent. If you lie on your back, put a pillow under your knees.   Medicines  Treatment may include medicines for pain and inflammation taken by mouth or applied to the skin, prescription pain medicine, or muscle relaxants. Take over-the-counter and prescription medicines only as told by your health care provider.  Ask your health care provider if the medicine prescribed to you: ? Requires you to avoid driving or using machinery. ? Can cause constipation. You may need to take these actions to prevent or treat constipation:  Drink enough fluid to keep your urine pale yellow.  Take over-the-counter or prescription medicines.  Eat foods that are high in fiber, such as beans, whole grains, and fresh fruits and vegetables.  Limit foods that are high in fat and processed sugars, such as fried or sweet foods. General instructions  Do not use any products that contain nicotine or tobacco, such as cigarettes, e-cigarettes, and chewing tobacco. If you need help quitting, ask your health care provider.  Keep all follow-up visits as told by your health care provider. This is  important. Contact a health care provider if:  You have pain that is not relieved with rest or medicine.  Your pain gets worse, or you have new pain.  You have a high fever.  You have rapid weight loss.  You have trouble doing your normal activities. Get help right away if:  You have weakness or numbness in one or both of your legs or feet.  You have trouble controlling your bladder or your bowels.  You have severe back pain and have any of the following: ? Nausea or vomiting. ? Pain in your abdomen. ? Shortness of breath or you faint. Summary  Chronic back pain is back pain that lasts longer than 3 months.  When a flare-up begins, apply ice to the painful area for the first 24-48 hours.  Apply a moist heat pad or use a heating pad on the painful area as directed by your health care provider.  When you are resting for longer periods, mix in some mild activity or stretching between periods of rest. This will help to prevent stiffness and pain. This information is not intended to replace advice given to you by your health care provider. Make sure you discuss any questions you have with your health care provider. Document Revised: 12/16/2019 Document Reviewed: 12/16/2019 Elsevier Patient Education  2021 [employer].

## 2021-02-06 ENCOUNTER — Other Ambulatory Visit (HOSPITAL_COMMUNITY): Payer: Self-pay | Admitting: Surgery

## 2021-02-06 ENCOUNTER — Encounter: Payer: Self-pay | Admitting: Gastroenterology

## 2021-02-06 DIAGNOSIS — C50911 Malignant neoplasm of unspecified site of right female breast: Secondary | ICD-10-CM

## 2021-02-06 MED ORDER — ERGOCALCIFEROL 1.25 MG (50000 UT) PO CAPS: 50000.0000 [IU] | ORAL_CAPSULE | ORAL | 12 refills | Status: DC

## 2021-02-15 ENCOUNTER — Telehealth: Payer: Self-pay

## 2021-02-15 NOTE — Telephone Encounter (Signed)
Patient is still having pain in her legs and its been going on for two weeks and no better please advise on what to do next.

## 2021-02-15 NOTE — Telephone Encounter (Signed)
Seen 3/17 for leg pain

## 2021-02-16 NOTE — Telephone Encounter (Signed)
Needs to be seen, for follow up.  Thx. Dr .Lovena Le

## 2021-02-17 NOTE — Telephone Encounter (Signed)
Please schedule a follow up visit .

## 2021-02-17 NOTE — Telephone Encounter (Signed)
Patient has appointment on 4/7

## 2021-02-23 ENCOUNTER — Ambulatory Visit (INDEPENDENT_AMBULATORY_CARE_PROVIDER_SITE_OTHER): Payer: 59 | Admitting: Family Medicine

## 2021-02-23 ENCOUNTER — Encounter: Payer: Self-pay | Admitting: Family Medicine

## 2021-02-23 ENCOUNTER — Other Ambulatory Visit: Payer: Self-pay

## 2021-02-23 VITALS — BP 100/68 | HR 83 | Temp 97.5°F | Ht 64.0 in

## 2021-02-23 DIAGNOSIS — M5136 Other intervertebral disc degeneration, lumbar region: Secondary | ICD-10-CM

## 2021-02-23 DIAGNOSIS — R110 Nausea: Secondary | ICD-10-CM

## 2021-02-23 DIAGNOSIS — M51369 Other intervertebral disc degeneration, lumbar region without mention of lumbar back pain or lower extremity pain: Secondary | ICD-10-CM

## 2021-02-23 MED ORDER — HYDROCODONE-ACETAMINOPHEN 5-325 MG PO TABS
1.0000 | ORAL_TABLET | Freq: Four times a day (QID) | ORAL | 0 refills | Status: DC | PRN
Start: 2021-02-23 — End: 2021-03-09

## 2021-02-23 MED ORDER — ONDANSETRON 4 MG PO TBDP
ORAL_TABLET | ORAL | 2 refills | Status: DC
Start: 2021-02-23 — End: 2021-03-21

## 2021-02-23 NOTE — Progress Notes (Signed)
Patient ID: [name], female    DOB: 18-Oct-1966, 55 y.o.   MRN: [id number]   No chief complaint on file.  Subjective:  CC: right hip pain  This is a chronic problem.  Presents today to follow-up on chronic back pain.  Has had 2 back surgeries, has degenerative disc disease, back pain is been present for a long time, worse since falling on the ice in January.  Reports that it continues to be worse.  We have discussed this at a previous visit, she presents today to discuss pain management.  She tries Voltaren gel, lidocaine pain patch, and Tylenol without relief.  Has had a history of going to physical therapy and steroid injections without relief.  Wishes to see pain management.  Also has chronic nausea, has an appointment with gastroenterology early May 2022, would like a refill on Zofran today to hold her until that appointment.  Denies fever, chills, chest pain, shortness of breath.   right hip pain and radiates down right leg. Started in January after a fall on the ice.   Left leg pain from thigh down. Goes numb sometimes. Started after back surgery in 2002. Leg is starting to give out on her.   Needs refill on zofran. Does not see GI til May.   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   Outpatient Encounter Medications as of 02/23/2021  Medication Sig  . atorvastatin (LIPITOR) 40 MG tablet TAKE  1 TABLET BY MOUTH AT BEDTIME  . blood glucose meter kit and supplies Dispense based on patient and insurance preference. Use up to four times daily as directed. (FOR ICD-10 E10.9, E11.9).  . cetirizine (ZYRTEC) 10 MG tablet Take 1 tablet (10 mg total) by mouth daily.  . citalopram (CELEXA) 40 MG tablet Take 1 tablet by mouth once daily  . Continuous Blood Gluc Receiver (FREESTYLE LIBRE 14 DAY READER) DEVI Use as directed.  . Continuous Blood Gluc Sensor (FREESTYLE LIBRE 14 DAY SENSOR) MISC Use as directed  . Dulaglutide (TRULICITY) 5.99 JT/7.0VX SOPN INJECT 0.75 MG SUBCUTANEOUSLY ONCE WEEKLY  . ergocalciferol (VITAMIN D2) 1.25 MG (50000 UT) capsule Take 1 capsule (50,000 Units total) by mouth once a week.  . fluticasone (FLONASE) 50 MCG/ACT nasal spray Place 2 sprays into both nostrils daily.  Marland Kitchen HYDROcodone-acetaminophen (NORCO/VICODIN) 5-325 MG tablet Take 1 tablet by mouth every 6 (six) hours as needed for moderate pain.  Marland Kitchen lisinopril (ZESTRIL) 10 MG tablet Take 1 tablet (10 mg total) by mouth daily.  . metFORMIN (GLUCOPHAGE-XR) 750 MG 24 hr tablet Take 1 tablet (750 mg total) by mouth daily with breakfast.  . pantoprazole (PROTONIX) 40 MG tablet TAKE 1 TABLET BY MOUTH ONCE DAILY IN THE MORNING  . [DISCONTINUED] ondansetron (ZOFRAN-ODT) 4 MG disintegrating tablet DISSOLVE 1 TABLET IN MOUTH EVERY 8 HOURS AS NEEDED FOR NAUSEA FOR VOMITING  . ondansetron (ZOFRAN-ODT) 4 MG disintegrating tablet DISSOLVE 1 TABLET IN MOUTH EVERY 8 HOURS AS NEEDED FOR NAUSEA FOR VOMITING  . [DISCONTINUED] cephALEXin (KEFLEX) 500 MG capsule Take 1 capsule (500 mg total) by mouth 2 (two) times daily.  . [  DISCONTINUED] cyclobenzaprine (FLEXERIL) 10 MG tablet Take 1 tablet (10 mg total) by mouth 2 (two) times daily as needed for muscle spasms.   No facility-administered encounter medications on file as of 02/23/2021.     Review of Systems  Constitutional: Negative for chills and fever.  Respiratory: Negative for  shortness of breath.   Cardiovascular: Negative for chest pain.  Gastrointestinal: Positive for nausea. Negative for abdominal pain, diarrhea and vomiting.  Musculoskeletal: Positive for back pain.     Vitals BP 100/68   Pulse 83   Temp (!) 97.5 F (36.4 C)   Ht $R'5\' 4"'jU$  (1.626 m)   LMP 04/05/2016 Comment: spotting 11/2016 and 12/2016  SpO2 98%   BMI 40.96 kg/m   Objective:   Physical Exam Vitals reviewed.  Constitutional:      Appearance: Normal appearance.  Cardiovascular:     Rate and Rhythm: Normal rate and regular rhythm.     Heart sounds: Normal heart sounds.  Pulmonary:     Effort: Pulmonary effort is normal.     Breath sounds: Normal breath sounds.  Musculoskeletal:     Comments: Chronic degenerative disk disease, worse since falling on ice in January. No acute changes.   Skin:    General: Skin is warm and dry.  Neurological:     General: No focal deficit present.     Mental Status: She is alert.  Psychiatric:        Behavior: Behavior normal.      Assessment and Plan   1. Nausea - ondansetron (ZOFRAN-ODT) 4 MG disintegrating tablet; DISSOLVE 1 TABLET IN MOUTH EVERY 8 HOURS AS NEEDED FOR NAUSEA FOR VOMITING  Dispense: 30 tablet; Refill: 2  2. Degenerative disc disease, lumbar - Ambulatory referral to Pain Clinic - HYDROcodone-acetaminophen (NORCO/VICODIN) 5-325 MG tablet; Take 1 tablet by mouth every 6 (six) hours as needed for moderate pain.  Dispense: 20 tablet; Refill: 0   Chronic back pain, degenerative disc disease, wishes to see pain clinic.  Referral sent.  Request one-time prescription for hydrocodone awaiting this appointment.  Understands I will only prescribe hydrocodone once.  Declines orthopedic referral, has tried steroid injections in the past without relief.  High-risk medication warning: Opioids and benzodiazepines are medications that can cause dependence. You should only take them on an as needed basis. Do not drive and/or operate dangerous  machinery while taking these medications. It is advised to take these medications the minimal amount needed.  Agrees with plan of care discussed today. Understands warning signs to seek further care: chest pain, shortness of breath, any significant change in health.  Understands to follow-up with pain clinic for chronic back pain.  Seen gastroenterology early May for chronic nausea, Zofran sent.  Understands hydrocodone prescription is a one-time prescription, understands this is high risk medication.  Encouraged to use sparingly.   Pecolia Ades, NP 02/23/2021

## 2021-02-23 NOTE — Patient Instructions (Signed)
High-risk medication warning:  Opioids and benzodiazepines are medications that can cause dependence. You should only take them on an as needed basis. Do not drive and/or operate dangerous machinery while taking these medications. It is advised to take these medications the minimal amount needed.    Degenerative Disk Disease  Degenerative disk disease is a condition caused by changes that occur in the spinal disks as a person ages. Spinal disks are soft and compressible disks located between the bones of your spine (vertebrae). These disks act like shock absorbers. Degenerative disk disease can affect the whole spine. However, the neck and lower back are most often affected. Many changes can occur in the spinal disks with aging, such as:  The spinal disks may dry and shrink.  Small tears may occur in the tough, outer covering of the disk (annulus).  The disk space may become smaller due to loss of water.  Abnormal growths in the bone (spurs) may occur. This can put pressure on the nerve roots exiting the spinal canal, causing pain.  The spinal canal may become narrowed. What are the causes? This condition may be caused by:  Normal degeneration with age.  Injuries.  Certain activities and sports that cause damage. What increases the risk? The following factors may make you more likely to develop this condition:  Being overweight.  Having a family history of degenerative disk disease.  Smoking and use of products that contain nicotine and tobacco.  Sudden injury.  Doing work that requires heavy lifting. What are the signs or symptoms? Symptoms of this condition include:  Pain that varies in intensity. Some people have no pain, while others have severe pain. The location of the pain depends on the part of your backbone that is affected. You may have: ? Pain in your neck or arm if a disk in your neck area is affected. ? Pain in your back, buttocks, or legs if a disk in your  lower back is affected.  Pain that becomes worse while bending or reaching up, or with twisting movements.  Pain that may start gradually and worsen as time passes. It may also start after a major or minor injury.  Numbness or tingling in the arms or legs. How is this diagnosed? This condition may be diagnosed based on:  Your symptoms and medical history.  A physical exam.  Imaging tests, including: ? X-ray of the spine. ? CT scan. ? MRI. How is this treated? This condition may be treated with:  Medicines.  Injection of steroids into the back.  Rehabilitation exercises. These activities aim to strengthen muscles in your back and abdomen to better support your spine. If treatments do not help to relieve your symptoms or you have severe pain, you may need surgery. Follow these instructions at home: Medicines  Take over-the-counter and prescription medicines only as told by your health care provider.  Ask your health care provider if the medicine prescribed to you: ? Requires you to avoid driving or using machinery. ? Can cause constipation. You may need to take these actions to prevent or treat constipation:  Drink enough fluid to keep your urine pale yellow.  Take over-the-counter or prescription medicines.  Eat foods that are high in fiber, such as beans, whole grains, and fresh fruits and vegetables.  Limit foods that are high in fat and processed sugars, such as fried or sweet foods. Activity  Rest as told by your health care provider.  Avoid sitting for a long time without  moving. Get up to take short walks every 1-2 hours. This is important to improve blood flow and breathing. Ask for help if you feel weak or unsteady.  Return to your normal activities as told by your health care provider. Ask your health care provider what activities are safe for you.  Perform relaxation exercises as told by your health care provider.  Maintain good posture.  Do not lift  anything that is heavier than 10 lb (4.5 kg), or the limit that you are told, until your health care provider says that it is safe.  Follow proper lifting and walking techniques as told by your health care provider. Managing pain, stiffness, and swelling  If directed, put ice on the painful area. Icing can help to relieve pain. To do this: ? Put ice in a plastic bag. ? Place a towel between your skin and the bag. ? Leave the ice on for 20 minutes, 2-3 times a day. ? Remove the ice if your skin turns bright red. This is very important. If you cannot feel pain, heat, or cold, you have a greater risk of damage to the area.  If directed, apply heat to the painful area as often as told by your health care provider. Heat can reduce the stiffness of your muscles. Use the heat source that your health care provider recommends, such as a moist heat pack or a heating pad. ? Place a towel between your skin and the heat source. ? Leave the heat on for 20-30 minutes. ? Remove the heat if your skin turns bright red. This is especially important if you are unable to feel pain, heat, or cold. You may have a greater risk of getting burned.      General instructions  Change your sitting, standing, and sleeping habits as told by your health care provider.  Avoid sitting in the same position for long periods of time. Change positions frequently.  Lose weight or maintain a healthy weight as told by your health care provider.  Do not use any products that contain nicotine or tobacco, such as cigarettes, e-cigarettes, and chewing tobacco. If you need help quitting, ask your health care provider.  Wear supportive footwear.  Keep all follow-up visits. This is important. This may include visits for physical therapy. Contact a health care provider if you:  Have pain that does not go away within 1-4 weeks.  Lose your appetite.  Lose weight without trying. Get help right away if you:  Have severe  pain.  Notice weakness in your arms, hands, or legs.  Begin to lose control of your bladder or bowel movements.  Have fevers or night sweats. Summary  Degenerative disk disease is a condition caused by changes that occur in the spinal disks as a person ages.  This condition can affect the whole spine. However, the neck and lower back are most often affected.  Take over-the-counter and prescription medicines only as told by your health care provider. This information is not intended to replace advice given to you by your health care provider. Make sure you discuss any questions you have with your health care provider. Document Revised: 02/18/2020 Document Reviewed: 02/18/2020 Elsevier Patient Education  2021 [employer].

## 2021-03-08 ENCOUNTER — Telehealth: Payer: Self-pay | Admitting: Family Medicine

## 2021-03-08 NOTE — Telephone Encounter (Signed)
Patient is requesting something for back/leg pain. She states referral person told her Dr. Sampson Goon doesn't except her insurance and it will be 4 to 5 weeks longer to find someone who takes her insurance. [facility]

## 2021-03-09 ENCOUNTER — Other Ambulatory Visit: Payer: Self-pay | Admitting: Family Medicine

## 2021-03-09 DIAGNOSIS — M5136 Other intervertebral disc degeneration, lumbar region: Secondary | ICD-10-CM

## 2021-03-09 MED ORDER — HYDROCODONE-ACETAMINOPHEN 5-325 MG PO TABS
1.0000 | ORAL_TABLET | Freq: Four times a day (QID) | ORAL | 0 refills | Status: DC | PRN
Start: 2021-03-09 — End: 2021-03-30

## 2021-03-09 NOTE — Telephone Encounter (Signed)
A refill of the hydrocodone was sent in.  We will not be able to do this long-term.  She should use sparingly caution drowsiness.  Do not take when driving.  Follow-up with specialist as planned follow-up here if any other issues thank you

## 2021-03-09 NOTE — Telephone Encounter (Signed)
Pt contacted and verbalized understanding. Pt states she is trying to get into the program but it is taking a while. Pt advised to use sparingly and not while driving and to keep all follow ups. Pt verbalized understanding.

## 2021-03-14 ENCOUNTER — Encounter: Payer: Self-pay | Admitting: Physical Medicine and Rehabilitation

## 2021-03-16 ENCOUNTER — Ambulatory Visit: Payer: 59 | Admitting: Obstetrics and Gynecology

## 2021-03-20 ENCOUNTER — Telehealth: Payer: Self-pay | Admitting: Family Medicine

## 2021-03-20 DIAGNOSIS — E1165 Type 2 diabetes mellitus with hyperglycemia: Secondary | ICD-10-CM

## 2021-03-20 DIAGNOSIS — F324 Major depressive disorder, single episode, in partial remission: Secondary | ICD-10-CM

## 2021-03-21 ENCOUNTER — Ambulatory Visit (INDEPENDENT_AMBULATORY_CARE_PROVIDER_SITE_OTHER): Payer: 59 | Admitting: Gastroenterology

## 2021-03-21 ENCOUNTER — Other Ambulatory Visit: Payer: Self-pay

## 2021-03-21 ENCOUNTER — Encounter: Payer: Self-pay | Admitting: Gastroenterology

## 2021-03-21 ENCOUNTER — Telehealth: Payer: Self-pay | Admitting: *Deleted

## 2021-03-21 ENCOUNTER — Ambulatory Visit: Payer: 59 | Admitting: Gastroenterology

## 2021-03-21 ENCOUNTER — Telehealth: Payer: Self-pay

## 2021-03-21 VITALS — BP 95/63 | HR 93 | Temp 96.9°F | Ht 64.0 in | Wt 243.4 lb

## 2021-03-21 DIAGNOSIS — R110 Nausea: Secondary | ICD-10-CM

## 2021-03-21 DIAGNOSIS — K5900 Constipation, unspecified: Secondary | ICD-10-CM | POA: Diagnosis not present

## 2021-03-21 MED ORDER — ONDANSETRON 4 MG PO TBDP
4.0000 mg | ORAL_TABLET | Freq: Three times a day (TID) | ORAL | 3 refills | Status: DC
Start: 2021-03-21 — End: 2021-10-23

## 2021-03-21 MED ORDER — PANTOPRAZOLE SODIUM 40 MG PO TBEC: 40.0000 mg | DELAYED_RELEASE_TABLET | Freq: Two times a day (BID) | ORAL | 3 refills | Status: DC

## 2021-03-21 NOTE — Progress Notes (Signed)
CC'ED TO PCP 

## 2021-03-21 NOTE — Progress Notes (Addendum)
Referring Provider: Chalmers Guest, NP Primary Care Physician:  Erven Colla, DO Primary GI: Dr. Gala Romney   Chief Complaint  Patient presents with  . Nausea    Going on for past few months, constant    HPI:   [name] is a 55 y.o. female presenting today with a history of abdominal pain, gastritis, undergoing EGD in Feb 2021 with normal esophagus s/p dilation, normal stomach, and normal duodenum. Surveillance colonoscopy due in Oct 2022.   About 2-3 months ago had acute gastroenteritis type symptoms with N/V/D. Symptoms all resolved except nausea. No further vomiting. Nausea continuous. Early satiety. Zofran about 2-3 times per day. Worse early in morning and late at night. Protonix once per day. Lower abdominal pain intermittent. Has bad sciatica and has been on pain medication. Dealing with more constipation later. BM every few days. Has to strain. No rectal bleeding.   A1c 6.0.   Past Medical History:  Diagnosis Date  . Anxiety   . Blood transfusion without reported diagnosis 1999   due to heavy menses  . Breast cancer ([place]) 05/22/2011   03/01/11, Stage 2, s/p lumpectomy, chemo/xrt  . DDD (degenerative disc disease), lumbar   . Depression 06/22/2011  . Diverticulitis   . DM (diabetes mellitus) ([place]) 06/22/2011  . Genital warts   . GERD (gastroesophageal reflux disease) 06/22/2011  . Hx of adenomatous colonic polyps 10/2007   98mm sigmoid tubular adenoma, FH colon cancer, mother in mid-50s  . Hypercholesterolemia 06/22/2011  . Hypertension 06/22/2011  . 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[place]) 05/22/2011  . Iron deficiency anemia 03/25/2015  . Morbid obesity ([place])   . Shortness of breath   . 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 Vaginal bleeding 03/08/2014    Past Surgical History:  Procedure Laterality Date  . back surg x2    . BREAST LUMPECTOMY Right 2012  . CHOLECYSTECTOMY    . COLONOSCOPY  11/03/07   4-mm sessile  polyp removed/small internal hemorrhoids/tubular adenoma, random colon bx negative for microscopic colitis  . COLONOSCOPY WITH PROPOFOL N/A 09/17/2016   Grade 2 hemorrhoids, colonic diverticulosis, ascending colon polyp (sessile serrated adenoma), 5 year surveillance  . COLONOSCOPY, ESOPHAGOGASTRODUODENOSCOPY (EGD) AND ESOPHAGEAL DILATION  01/2012   mild gastritis s/p biopsy. Empiric dilation. Normal duodenum.   Marland Kitchen DILATATION & CURETTAGE/HYSTEROSCOPY WITH MYOSURE N/A 02/10/2015   Procedure: DILATATION & CURETTAGE/HYSTEROSCOPY WITH MYOSURE;  Surgeon: Nunzio Cobbs, MD;  Location: [facility];  Service: Gynecology;  Laterality: N/A;  . ESOPHAGOGASTRODUODENOSCOPY (EGD) WITH PROPOFOL N/A 01/11/2020   Normal esophagus, s/p dilation, normal stomach, normal duodenum.   Marland Kitchen MALONEY DILATION N/A 01/11/2020   Procedure: Venia Minks DILATION;  Surgeon: Daneil Dolin, MD;  [facility]: [facility];  Service: Endoscopy;  Laterality: N/A;  . MM BREAST STEREO BX*L*R/S     rt.  Marland Kitchen POLYPECTOMY  09/17/2016   Procedure: POLYPECTOMY;  Surgeon: Daneil Dolin, MD;  Location: [facility];  Service: Endoscopy;;  ascending colon  . PORT-A-CATH REMOVAL  05/26/2012   Procedure: REMOVAL PORT-A-CATH;  Surgeon: Jamesetta So, MD;  Location: [facility];  Service: General;  Laterality: N/A;  Minor Room  . PORTACATH PLACEMENT      Current Outpatient Medications  Medication Sig Dispense Refill  . atorvastatin (LIPITOR) 40 MG tablet TAKE 1 TABLET BY MOUTH AT BEDTIME 90 tablet 0  . blood glucose meter kit and supplies Dispense  based on patient and insurance preference. Use up to four times daily as directed. (FOR ICD-10 E10.9, E11.9). 1 each 5  . cetirizine (ZYRTEC) 10 MG tablet Take 1 tablet (10 mg total) by mouth daily. 30 tablet 3  . citalopram (CELEXA) 40 MG tablet Take 1 tablet by mouth once daily 30 tablet 0  . Continuous Blood Gluc Receiver (FREESTYLE LIBRE 14 DAY READER) DEVI Use as directed. 1 each 0  . Continuous Blood  Gluc Sensor (FREESTYLE LIBRE 14 DAY SENSOR) MISC Use as directed 1 each 3  . Dulaglutide (TRULICITY) 3.34 DH/6.8SH SOPN INJECT 0.75 MG SUBCUTANEOUSLY ONCE WEEKLY 4 mL 2  . ergocalciferol (VITAMIN D2) 1.25 MG (50000 UT) capsule Take 1 capsule (50,000 Units total) by mouth once a week. 4 capsule 12  . fluticasone (FLONASE) 50 MCG/ACT nasal spray Place 2 sprays into both nostrils daily. 16 g 2  . HYDROcodone-acetaminophen (NORCO/VICODIN) 5-325 MG tablet Take 1 tablet by mouth every 6 (six) hours as needed for moderate pain. 20 tablet 0  . lisinopril (ZESTRIL) 10 MG tablet Take 1 tablet (10 mg total) by mouth daily. 90 tablet 1  . metFORMIN (GLUCOPHAGE-XR) 750 MG 24 hr tablet Take 1 tablet (750 mg total) by mouth daily with breakfast. 90 tablet 1  . ondansetron (ZOFRAN-ODT) 4 MG disintegrating tablet Take 1 tablet (4 mg total) by mouth 3 (three) times daily before meals. As needed for nausea 90 tablet 3  . pantoprazole (PROTONIX) 40 MG tablet Take 1 tablet (40 mg total) by mouth 2 (two) times daily before a meal. 180 tablet 3   No current facility-administered medications for this visit.    Allergies as of 03/21/2021 - Review Complete 03/21/2021  Allergen Reaction Noted  . Flagyl [metronidazole] Hives, Itching, and Swelling 10/15/2017  . Lansoprazole Other (See Comments) 12/26/2017  . Pravastatin Other (See Comments) 08/02/2015    Family History  Problem Relation Age of Onset  . Colon cancer Mother        mid-50s, died of metastatic disease  . Cancer Mother        liver  . Coronary artery disease Mother   . Diabetes type I Mother   . Peripheral vascular disease Mother        Carotid disease in her 39s  . Coronary artery disease Father        CAD in his 48s  . Diabetes Father   . Diabetes type I Father   . Hypertension Father   . Bipolar disorder Sister   . Coronary artery disease Brother        MI in his 42s  . Hypertension Brother   . Hypertension Maternal Grandmother   .  Hyperlipidemia Maternal Grandmother   . Stroke Maternal Grandmother   . Hypertension Maternal Grandfather   . Diabetes Maternal Grandfather   . Diabetes Paternal Grandmother   . Heart disease Paternal Grandfather     Social History   Socioeconomic History  . Marital status: Married    Spouse name: Not on file  . Number of children: 3  . Years of education: Not on file  . Highest education level: Not on file  Occupational History  . Occupation: child care    Employer: [employer]  Tobacco Use  . Smoking status: Never Smoker  . Smokeless tobacco: Never Used  Vaping Use  . Vaping Use: Never used  Substance and Sexual Activity  . Alcohol use: No    Alcohol/week: 0.0 standard drinks  . Drug  use: No  . Sexual activity: Yes    Partners: Male    Birth control/protection: None  Other Topics Concern  . Not on file  Social History Narrative  . Not on file   Social Determinants of Health   Financial Resource Strain: Not on file  Food Insecurity: Not on file  Transportation Needs: Not on file  Physical Activity: Not on file  Stress: Not on file  Social Connections: Not on file    Review of Systems: Gen: Denies fever, chills, anorexia. Denies fatigue, weakness, weight loss.  CV: Denies chest pain, palpitations, syncope, peripheral edema, and claudication. Resp: Denies dyspnea at rest, cough, wheezing, coughing up blood, and pleurisy. GI: Denies vomiting blood, jaundice, and fecal incontinence.   Denies dysphagia or odynophagia. Derm: Denies rash, itching, dry skin Psych: Denies depression, anxiety, memory loss, confusion. No homicidal or suicidal ideation.  Heme: Denies bruising, bleeding, and enlarged lymph nodes.  Physical Exam: BP 95/63 Pulse 93   Temp (!) 96.9 F (36.1 C) (Temporal)   Ht $R'5\' 4"'ov$  (1.626 m)   Wt 243 lb 6.4 oz (110.4 kg)   LMP 04/05/2016 Comment: spotting 11/2016 and 12/2016  BMI 41.78 kg/m  General:   Alert and oriented. No distress noted.  Pleasant and cooperative.  Head:  Normocephalic and atraumatic. Eyes:  Conjuctiva clear without scleral icterus. Mouth:  Mask in place. Abdomen:  +BS, soft, non-tender and non-distended. No rebound or guarding. No HSM or masses noted. Msk:  Symmetrical without gross deformities. Normal posture. Extremities:  Without edema. Neurologic:  Alert and  oriented x4 Psych:  Alert and cooperative. Normal mood and affect.  ASSESSMENT: [name] is a 55 y.o. female presenting today with a history of abdominal pain, gastritis, undergoing EGD in Feb 2021 with normal esophagus s/p dilation, normal stomach, and normal duodenum. Surveillance colonoscopy due in Oct 2022. Now with nausea following acute viral illness.  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 Diabetes seems to be well-controlled. We discussed increasing PPI to BID, using Zofran prn TID, and pursuing GES. If +GES, can consider low dose Reglan short-term if necessary.   Constipation: in setting of opioid therapy. Will trial Linzess 145 mcg and anticipate may need 290 mcg daily.   PLAN:  GES PPI BID Zofran TID before meals Gastroparesis diet provided Linzess 145 mcg daily. Call with update.  Colonoscopy in Oct 2022 Return in 2-3 months   Annitta Needs, PhD, [facility]

## 2021-03-21 NOTE — Telephone Encounter (Signed)
Last seen for depression feb 2022

## 2021-03-21 NOTE — Telephone Encounter (Signed)
Sent my chart message appointment

## 2021-03-21 NOTE — Telephone Encounter (Signed)
Called Friday health plan (pt insurance). Was advised PA is required for GES. I need to fill out form and fax with clinicals. Records faxed

## 2021-03-21 NOTE — Patient Instructions (Addendum)
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 For now, let's increase Protonix to twice a day, 30 minutes before breakfast and dinner.   I have also sent in Zofran to take before meals up to 3 times a day as needed.   We have ordered a gastric emptying study.  For constipation, I have given samples of Linzess. Start taking Linzess 1 capsule 30 minutes before breakfast daily. It is normal to have some looser stool starting out for the first few days, but this should improve. If it does not, please call us, as we will need to adjust the dosage.   We will see you in 2-3 months!    Gastroparesis  Gastroparesis is a condition in which food takes longer than normal to empty from the stomach. This condition is also known as delayed gastric emptying. It is usually a long-term (chronic) condition. There is no cure, but there are treatments and things that you can do at home to help relieve symptoms. Treating the underlying condition that causes gastroparesis can also help relieve symptoms. What are the causes? In many cases, the cause of this condition is not known. Possible causes include:  A hormone (endocrine) disorder, such as hypothyroidism or diabetes.  A nervous system disease, such as Parkinson's disease or multiple sclerosis.  Cancer, infection, or surgery that affects the stomach or vagus nerve. The vagus nerve runs from your chest, through your neck, and to the lower part of your brain.  A connective tissue disorder, such as scleroderma.  Certain medicines. What increases the risk? You are more likely to develop this condition if:  You have certain disorders or diseases. These may include: ? An endocrine disorder. ? An eating disorder. ? Amyloidosis. ? Scleroderma. ? Parkinson's disease. ? Multiple sclerosis. ? Cancer or infection of the stomach or the vagus nerve.  You have had surgery on your stomach or vagus nerve.  You take certain  medicines.  You are female. What are the signs or symptoms? Symptoms of this condition include:  Feeling full after eating very little or a loss of appetite.  Nausea, vomiting, or heartburn.  Bloating of your abdomen.  Inconsistent blood sugar (glucose) levels on blood tests.  Unexplained weight loss.  Acid from the stomach coming up into the esophagus (gastroesophageal reflux).  Sudden tightening (spasm) of the stomach, which can be painful. Symptoms may come and go. Some people may not notice any symptoms. How is this diagnosed? This condition is diagnosed with tests, such as:  Tests that check how long it takes food to move through the stomach and intestines. These tests include: ? Upper gastrointestinal (GI) series. For this test, you drink a liquid that shows up well on X-rays, and then X-rays are taken of your intestines. ? Gastric emptying scintigraphy. For this test, you eat food that contains a small amount of radioactive material, and then scans are taken. ? Wireless capsule GI monitoring system. For this test, you swallow a pill (capsule) that records information about how foods and fluid move through your stomach.  Gastric manometry. For this test, a tube is passed down your throat and into your stomach to measure electrical and muscular activity.  Endoscopy. For this test, a long, thin tube with a camera and light on the end is passed down your throat and into your stomach to check for problems in your stomach lining.  Ultrasound. This test uses sound waves to create images of the inside of your body.  This can help rule out gallbladder disease or pancreatitis as a cause of your symptoms. How is this treated? There is no cure for this condition, but treatment and home care may relieve symptoms. Treatment may include:  Treating the underlying cause.  Managing your symptoms by making changes to your diet and exercise habits.  Taking medicines to control nausea and  vomiting and to stimulate stomach muscles.  Getting food through a feeding tube in the hospital. This may be done in severe cases.  Having surgery to insert a device called a gastric electrical stimulator into your body. This device helps improve stomach emptying and control nausea and vomiting. Follow these instructions at home:  Take over-the-counter and prescription medicines only as told by your health care provider.  Follow instructions from your health care provider about eating or drinking restrictions. Your health care provider may recommend that you: ? Eat smaller meals more often. ? Eat low-fat foods. ? Eat low-fiber forms of high-fiber foods. For example, eat cooked vegetables instead of raw vegetables. ? Have only liquid foods instead of solid foods. Liquid foods are easier to digest.  Drink enough fluid to keep your urine pale yellow.  Exercise as often as told by your health care provider.  Keep all follow-up visits. This is important. Contact a health care provider if you:  Notice that your symptoms do not improve with treatment.  Have new symptoms. Get help right away if you:  Have severe pain in your abdomen that does not improve with treatment.  Have nausea that is severe or does not go away.  Vomit every time you drink fluids. Summary  Gastroparesis is a long-term (chronic) condition in which food takes longer than normal to empty from the stomach.  Symptoms include nausea, vomiting, heartburn, bloating of your abdomen, and loss of appetite.  Eating smaller portions, low-fat foods, and low-fiber forms of high-fiber foods may help you manage your symptoms.  Get help right away if you have severe pain in your abdomen. This information is not intended to replace advice given to you by your health care provider. Make sure you discuss any questions you have with your health care provider. Document Revised: 03/14/2020 Document Reviewed: 03/14/2020 Elsevier  Patient Education  2021 [employer].

## 2021-03-21 NOTE — Telephone Encounter (Signed)
Pt is out of this medication and needs refill citalopram (CELEXA) 40 MG tablet  [place], [place] - [id number]  Pt call back [phone]

## 2021-03-21 NOTE — Telephone Encounter (Signed)
Please contact patient and have her schedule appt to follow up on anxiety. Thank you!

## 2021-03-22 NOTE — Telephone Encounter (Signed)
Needs appt to go over depression in next 30 days. Will send in 30 day supply.  Thx. Dr Lovena Le

## 2021-03-27 NOTE — Telephone Encounter (Signed)
Received approval from [employer]. Auth# [id number] DOS 03/22/2021-06/22/2021  Called [facility] to schedule but no answer. wcb

## 2021-03-27 NOTE — Telephone Encounter (Signed)
GES scheduled for Thursday 5/12 at [place] pt. She is going to have to r/s. She is aware of # to r/s.

## 2021-03-28 ENCOUNTER — Ambulatory Visit: Payer: 59 | Admitting: Obstetrics and Gynecology

## 2021-03-30 ENCOUNTER — Ambulatory Visit (HOSPITAL_COMMUNITY): Payer: 59

## 2021-03-30 ENCOUNTER — Telehealth: Payer: Self-pay

## 2021-03-30 DIAGNOSIS — M5136 Other intervertebral disc degeneration, lumbar region: Secondary | ICD-10-CM

## 2021-03-30 MED ORDER — HYDROCODONE-ACETAMINOPHEN 5-325 MG PO TABS: 1.0000 | ORAL_TABLET | Freq: Three times a day (TID) | ORAL | 0 refills | Status: DC | PRN

## 2021-03-30 NOTE — Telephone Encounter (Signed)
Med check follow up on 5/27

## 2021-03-30 NOTE — Telephone Encounter (Signed)
  Legs hurting at night, is out of pain meds.  Can't get in with the pain clinic until June 18th.  Can we refill her Hydrocodone?  [facility] [place]

## 2021-03-30 NOTE — Telephone Encounter (Signed)
Pt contacted and verbalized understanding. E-Script was not working and unable to fax to pharmacy. Pharmacy stated that patient could pick up script and hand it to them. Pt verbalized understanding.

## 2021-03-30 NOTE — Telephone Encounter (Signed)
[name],  We got over a note from [place] regarding this pt. Insurance will only cover 1 Qd. We have to do a PA or change this medication. What do you want me to do for this patient? Please advise.   thanks

## 2021-03-31 ENCOUNTER — Telehealth: Payer: Self-pay

## 2021-03-31 NOTE — Telephone Encounter (Signed)
[name],  This pt came by this morning wanting Linzess samples and I gave her more. So please send in a Rx for Linzess 145 mcg.   thanks

## 2021-04-03 MED ORDER — LINACLOTIDE 145 MCG PO CAPS: 145.0000 ug | ORAL_CAPSULE | Freq: Every day | ORAL | 3 refills | Status: DC

## 2021-04-03 NOTE — Telephone Encounter (Signed)
Noted and yes i'm sorry I did not put the name of the medication in my note.

## 2021-04-03 NOTE — Telephone Encounter (Signed)
Completed.

## 2021-04-03 NOTE — Telephone Encounter (Signed)
We need to do a PA. Are referring to Protonix?   She has chronic GERD, likely gastroparesis.

## 2021-04-03 NOTE — Addendum Note (Signed)
Addended by: Annitta Needs on: 04/03/2021 04:15 PM   Modules accepted: Orders

## 2021-04-04 NOTE — Telephone Encounter (Signed)
Have to call pt insurance

## 2021-04-05 ENCOUNTER — Ambulatory Visit (HOSPITAL_COMMUNITY)
Admission: RE | Admit: 2021-04-05 | Discharge: 2021-04-05 | Disposition: A | Discharge status: Home / Self Care | Payer: 59 | Source: Ambulatory Visit | Attending: Gastroenterology | Admitting: Gastroenterology

## 2021-04-05 DIAGNOSIS — R110 Nausea: Secondary | ICD-10-CM | POA: Diagnosis not present

## 2021-04-05 MED ORDER — TECHNETIUM TC 99M SULFUR COLLOID
2.0000 | Freq: Once | INTRAVENOUS | Status: AC | PRN
  Administered 2021-04-05: 1.92 via ORAL

## 2021-04-12 ENCOUNTER — Telehealth: Payer: Self-pay

## 2021-04-12 NOTE — Telephone Encounter (Signed)
Yes, let's do a PA if possible.

## 2021-04-12 NOTE — Telephone Encounter (Signed)
NOTED   Already done, waiting on response

## 2021-04-12 NOTE — Telephone Encounter (Signed)
Pt just left here picking up samples of Linzess, but her Pantoprazole has to be changed to one a day if the pt's insurance is going to pay for it.  Or I will do a PA real quick to see if this code will work

## 2021-04-13 ENCOUNTER — Telehealth: Payer: Self-pay

## 2021-04-13 NOTE — Telephone Encounter (Signed)
[facility] HAS APPROVED THE PT TO RECEIVE PANTOPRAZOLE SOD DR 40 MG TAB FROM 04/13/2021 TO 04/13/2022. PT CALLED AND VOICEMAIL LEFT FOR HER. GAVE TO SUSAN TO SEND TO SCAN.

## 2021-04-14 ENCOUNTER — Ambulatory Visit (INDEPENDENT_AMBULATORY_CARE_PROVIDER_SITE_OTHER): Payer: 59 | Admitting: Family Medicine

## 2021-04-14 ENCOUNTER — Other Ambulatory Visit: Payer: Self-pay

## 2021-04-14 ENCOUNTER — Encounter: Payer: Self-pay | Admitting: Family Medicine

## 2021-04-14 ENCOUNTER — Telehealth: Payer: Self-pay

## 2021-04-14 VITALS — BP 124/82 | HR 84 | Temp 97.1°F | Ht 64.0 in | Wt 242.4 lb

## 2021-04-14 DIAGNOSIS — F324 Major depressive disorder, single episode, in partial remission: Secondary | ICD-10-CM

## 2021-04-14 DIAGNOSIS — G620 Drug-induced polyneuropathy: Secondary | ICD-10-CM | POA: Diagnosis not present

## 2021-04-14 DIAGNOSIS — Z9889 Other specified postprocedural states: Secondary | ICD-10-CM

## 2021-04-14 DIAGNOSIS — F32A Depression, unspecified: Secondary | ICD-10-CM | POA: Diagnosis not present

## 2021-04-14 DIAGNOSIS — F419 Anxiety disorder, unspecified: Secondary | ICD-10-CM

## 2021-04-14 MED ORDER — CITALOPRAM HYDROBROMIDE 40 MG PO TABS: 40.0000 mg | ORAL_TABLET | Freq: Every day | ORAL | 1 refills | Status: DC

## 2021-04-14 NOTE — Telephone Encounter (Signed)
ERROR

## 2021-04-14 NOTE — Telephone Encounter (Signed)
Phoned and LMOVM of the pt regarding what medications/ Rx's in the past has she tried and failed for her chronic constipation

## 2021-04-14 NOTE — Progress Notes (Signed)
Patient ID: [name], female    DOB: 05/08/1966, 55 y.o.   MRN: [id number]   Chief Complaint  Patient presents with  . Depression   Subjective:    HPI   Pt here for follow up on depression. Pt states she has been having a rough time with arthiristis and pain in legs;  Pt is using her norco as sparingly as she can but has been hurting. Pt doing well on Celexa; has good days and bad days.    Pt f/u on depression. Feeling okay on the celexa.  Feels about the same.  Feeling might need to talk to someone and having financial issues and marital concerns.  Seeing pain management in June 16.   Seeing oncology, has neuropathy in hands in feet.  Has h/o lumbar surgery and chronic pain with multiple joints and sciatica.  Was on hydrocodone chronically for a few years while at this practice.   Tried gabapentin in past, but made her feel funny.  Happening at work intermittent with the numbness in hands but resolves with re-positioning. Pins and needles and not burning. Goes away and it resolves with shaking the hands.  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   Outpatient Encounter Medications as of 04/14/2021  Medication Sig  . atorvastatin (LIPITOR) 40 MG tablet TAKE 1 TABLET BY MOUTH AT BEDTIME  . blood glucose meter kit and supplies Dispense based on patient and insurance preference. Use  up to four times daily as directed. (FOR ICD-10 E10.9, E11.9).  . cetirizine (ZYRTEC) 10 MG tablet Take 1 tablet (10 mg total) by mouth daily.  . Continuous Blood Gluc Receiver (FREESTYLE LIBRE 14 DAY READER) DEVI Use as directed.  . Continuous Blood Gluc Sensor (FREESTYLE LIBRE 14 DAY SENSOR) MISC Use as directed  . Dulaglutide (TRULICITY) 4.03 KV/4.2VZ SOPN INJECT 0.75 MG SUBCUTANEOUSLY ONCE WEEKLY  . ergocalciferol (VITAMIN D2) 1.25 MG (50000 UT) capsule Take 1 capsule (50,000 Units total) by mouth once a week.  . fluticasone (FLONASE) 50 MCG/ACT nasal spray Place 2 sprays into both nostrils daily.  Marland Kitchen HYDROcodone-acetaminophen (NORCO/VICODIN) 5-325 MG tablet Take 1 tablet by mouth 3 (three) times daily as needed for moderate pain.  Marland Kitchen linaclotide (LINZESS) 145 MCG CAPS capsule Take 1 capsule (145 mcg total) by mouth daily before breakfast.  . lisinopril (ZESTRIL) 10 MG tablet Take 1 tablet (10 mg total) by mouth daily.  . metFORMIN (GLUCOPHAGE-XR) 750 MG 24 hr tablet Take 1 tablet (750 mg total) by mouth daily with breakfast.  . ondansetron (ZOFRAN-ODT) 4 MG disintegrating tablet Take 1 tablet (4 mg total) by mouth 3 (three) times daily before meals. As needed for nausea  . pantoprazole (PROTONIX) 40 MG tablet Take 1 tablet (40 mg total) by mouth 2 (two) times daily before a meal.  . [DISCONTINUED] citalopram (CELEXA) 40 MG tablet Take 1 tablet (40 mg total) by mouth daily. Needs appt.  . citalopram (CELEXA) 40 MG tablet Take 1 tablet (40 mg total) by mouth daily.   No facility-administered encounter medications on file as of 04/14/2021.  Review of Systems  Constitutional: Negative for chills and fever.  HENT: Negative for congestion, rhinorrhea and sore throat.   Respiratory: Negative for cough, shortness of breath and wheezing.   Cardiovascular: Negative for chest pain and leg swelling.  Gastrointestinal: Negative for abdominal pain, diarrhea, nausea and vomiting.  Genitourinary:  Negative for dysuria and frequency.  Musculoskeletal: Positive for back pain (chronic). Negative for arthralgias.  Skin: Negative for rash.  Neurological: Negative for dizziness, weakness and headaches.  Psychiatric/Behavioral: Positive for dysphoric mood (chronic). Negative for self-injury, sleep disturbance and suicidal ideas. The patient is nervous/anxious (chronic ).      Vitals BP 124/82   Pulse 84   Temp (!) 97.1 F (36.2 C)   Ht $R'5\' 4"'Fk$  (1.626 m)   Wt 242 lb 6.4 oz (110 kg)   LMP 04/05/2016 Comment: spotting 11/2016 and 12/2016  SpO2 98%   BMI 41.61 kg/m   Objective:   Physical Exam Vitals and nursing note reviewed.  Constitutional:      Appearance: Normal appearance.  HENT:     Head: Normocephalic and atraumatic.     Nose: Nose normal.     Mouth/Throat:     Mouth: Mucous membranes are moist.     Pharynx: Oropharynx is clear.  Eyes:     Extraocular Movements: Extraocular movements intact.     Conjunctiva/sclera: Conjunctivae normal.     Pupils: Pupils are equal, round, and reactive to light.  Cardiovascular:     Rate and Rhythm: Normal rate and regular rhythm.     Pulses: Normal pulses.     Heart sounds: Normal heart sounds.  Pulmonary:     Effort: Pulmonary effort is normal.     Breath sounds: Normal breath sounds. No wheezing, rhonchi or rales.  Musculoskeletal:        General: Normal range of motion.     Right lower leg: No edema.     Left lower leg: No edema.  Skin:    General: Skin is warm and dry.     Findings: No lesion or rash.  Neurological:     General: No focal deficit present.     Mental Status: She is alert and oriented to person, place, and time.  Psychiatric:        Mood and Affect: Mood normal.        Behavior: Behavior normal.      Assessment and Plan   1. Anxiety - Ambulatory referral to Psychology  2. 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 History of lumbar surgery   Chronic back pain/ multiple joint pains/sciatica and neuropathy- long standing. Reviewed with pt that she needs to f/u with pain management.  Given referrals and a couple refills on norco and advised that she needs to make it last.  We reviewed at multiple appt that she was given a one time fill and she keeps asking for more refills. Reviewed with pt needing further evaluation and to get to her pain management appt. reviewed with pt that we don't do chronic pain medications.  Recommending tylenol prn, heat/ice, topicals otc.   Depression/anxiety- stable. Pt not ready to change medications at this time.  Will cont with celexa, pt stating her symptoms are about the same.  Pt will to see psychologist for further help. May benefit in future from cymbalta with her chronic pain.  Return if symptoms worsen or fail to improve.

## 2021-04-18 ENCOUNTER — Other Ambulatory Visit: Payer: Self-pay | Admitting: Family Medicine

## 2021-04-20 ENCOUNTER — Telehealth: Payer: Self-pay

## 2021-04-20 NOTE — Telephone Encounter (Signed)
Prior authorization request form faxed to [facility] for this pt to receive/get approved for Linzess 145 mcg. PA has been done as well as an appeal for the pt. Now waiting n a decision from [facility].

## 2021-04-21 ENCOUNTER — Telehealth: Payer: Self-pay

## 2021-04-21 NOTE — Telephone Encounter (Signed)
[name], I have done a PA, a appeal, even filled out extra paperwork for this pt to get Linzess 145 mcg and her insurance keeps rejecting it. Not on pt's formulary. Her insurance I don't know too much about so back to the drawing board with the pt.

## 2021-04-24 NOTE — Telephone Encounter (Signed)
If she is doing well on Linzess 145 mcg daily, please provide samples and let's have her do patient assistance through the company. If not, we can trial Amitiza, and I can send that in.

## 2021-04-25 MED ORDER — LUBIPROSTONE 8 MCG PO CAPS: 8.0000 ug | ORAL_CAPSULE | Freq: Two times a day (BID) | ORAL | 5 refills | Status: DC

## 2021-04-25 NOTE — Addendum Note (Signed)
Addended by: Annitta Needs on: 04/25/2021 11:57 AM   Modules accepted: Orders

## 2021-04-25 NOTE — Telephone Encounter (Signed)
Pt would like to see if we can get Amitiza approved for her and if not then we can do the pt assistance paperwork.

## 2021-04-25 NOTE — Telephone Encounter (Signed)
Returned the pt's call and LMOVM. 

## 2021-04-25 NOTE — Telephone Encounter (Signed)
Phoned and advised the pt of the Rx being sent in and the instructions on how to use it.  Pt agreed

## 2021-04-25 NOTE — Telephone Encounter (Signed)
I sent in Amitiza 8 mcg po BID with food. Make sure to take with food to avoid nausea, as this is a classic side effect.

## 2021-04-27 ENCOUNTER — Ambulatory Visit (HOSPITAL_COMMUNITY)
Admission: RE | Admit: 2021-04-27 | Discharge: 2021-04-27 | Disposition: A | Discharge status: Home / Self Care | Payer: 59 | Source: Ambulatory Visit | Attending: Hematology | Admitting: Hematology

## 2021-04-27 ENCOUNTER — Other Ambulatory Visit: Payer: Self-pay

## 2021-04-27 ENCOUNTER — Inpatient Hospital Stay (HOSPITAL_COMMUNITY): Payer: 59 | Attending: Hematology

## 2021-04-27 ENCOUNTER — Ambulatory Visit (HOSPITAL_COMMUNITY): Payer: 59

## 2021-04-27 ENCOUNTER — Other Ambulatory Visit (HOSPITAL_COMMUNITY): Payer: 59

## 2021-04-27 DIAGNOSIS — Z923 Personal history of irradiation: Secondary | ICD-10-CM | POA: Insufficient documentation

## 2021-04-27 DIAGNOSIS — Z853 Personal history of malignant neoplasm of breast: Secondary | ICD-10-CM | POA: Insufficient documentation

## 2021-04-27 DIAGNOSIS — Z79899 Other long term (current) drug therapy: Secondary | ICD-10-CM | POA: Insufficient documentation

## 2021-04-27 DIAGNOSIS — E559 Vitamin D deficiency, unspecified: Secondary | ICD-10-CM | POA: Diagnosis not present

## 2021-04-27 DIAGNOSIS — Z7984 Long term (current) use of oral hypoglycemic drugs: Secondary | ICD-10-CM | POA: Insufficient documentation

## 2021-04-27 DIAGNOSIS — C50911 Malignant neoplasm of unspecified site of right female breast: Secondary | ICD-10-CM

## 2021-04-27 DIAGNOSIS — Z1231 Encounter for screening mammogram for malignant neoplasm of breast: Secondary | ICD-10-CM

## 2021-04-27 DIAGNOSIS — E119 Type 2 diabetes mellitus without complications: Secondary | ICD-10-CM

## 2021-04-27 LAB — COMPREHENSIVE METABOLIC PANEL
ALT: 18 U/L (ref 0–44)
AST: 18 U/L (ref 15–41)
Albumin: 4.1 g/dL (ref 3.5–5.0)
Alkaline Phosphatase: 66 U/L (ref 38–126)
Anion gap: 9 (ref 5–15)
BUN: 9 mg/dL (ref 6–20)
CO2: 28 mmol/L (ref 22–32)
Calcium: 8.7 mg/dL — ABNORMAL LOW (ref 8.9–10.3)
Chloride: 100 mmol/L (ref 98–111)
Creatinine, Ser: 0.9 mg/dL (ref 0.44–1.00)
GFR, Estimated: >60 mL/min (ref >60)
Glucose, Bld: 97 mg/dL (ref 70–99)
Potassium: 3.7 mmol/L (ref 3.5–5.1)
Sodium: 137 mmol/L (ref 135–145)
Total Bilirubin: 0.5 mg/dL (ref 0.3–1.2)
Total Protein: 7.9 g/dL (ref 6.5–8.1)

## 2021-04-27 LAB — CBC WITH DIFFERENTIAL/PLATELET
Abs Immature Granulocytes: 0.01 10*3/uL (ref 0.00–0.07)
Basophils Absolute: 0 10*3/uL (ref 0.0–0.1)
Basophils Relative: 1 %
Eosinophils Absolute: 0.1 10*3/uL (ref 0.0–0.5)
Eosinophils Relative: 2 %
HCT: 41.7 % (ref 36.0–46.0)
Hemoglobin: 12.9 g/dL (ref 12.0–15.0)
Immature Granulocytes: 0 %
Lymphocytes Relative: 35 %
Lymphs Abs: 2.1 10*3/uL (ref 0.7–4.0)
MCH: 27.7 pg (ref 26.0–34.0)
MCHC: 30.9 g/dL (ref 30.0–36.0)
MCV: 89.7 fL (ref 80.0–100.0)
Monocytes Absolute: 0.4 10*3/uL (ref 0.1–1.0)
Monocytes Relative: 7 %
Neutro Abs: 3.4 10*3/uL (ref 1.7–7.7)
Neutrophils Relative %: 55 %
Platelets: 218 10*3/uL (ref 150–400)
RBC: 4.65 MIL/uL (ref 3.87–5.11)
RDW: 15 % (ref 11.5–15.5)
WBC: 6.1 10*3/uL (ref 4.0–10.5)
nRBC: 0 % (ref 0.0–0.2)

## 2021-04-27 LAB — VITAMIN D 25 HYDROXY (VIT D DEFICIENCY, FRACTURES): Vit D, 25-Hydroxy: 64.59 ng/mL (ref 30–100)

## 2021-05-02 ENCOUNTER — Telehealth: Payer: Self-pay | Admitting: Gastroenterology

## 2021-05-02 NOTE — Telephone Encounter (Signed)
PATIENT CALLED ASKING ABOUT THE STATUS OF HER FINANCIAL ASSISTANCE FOR HER AMITEZA

## 2021-05-02 NOTE — Telephone Encounter (Signed)
Returned the pt's call LMOVM that I will call her tomorrow.

## 2021-05-03 NOTE — Progress Notes (Signed)
[facility] [address] [place] [address]   Patient Care Team: Erven Colla, DO as PCP - General (Family Medicine) Danie Binder, MD (Inactive) (Gastroenterology)  SUMMARY OF ONCOLOGIC HISTORY: Oncology History  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[place])  02/28/2011 Initial Diagnosis   Right needle core biopsy demonstrating Invasive ductal carcinoma of right breast    03/21/2011 Surgery   Right lumpectomy demonstrating a 3.8 cm invasive ductal carcinoma, grade III, no LVI, and 0/1 lymph nodes    05/11/2011 - 07/13/2011 Chemotherapy   AC x 4    08/03/2011 - 10/19/2011 Chemotherapy   Paclitaxel weekly x 12    11/07/2011 - 12/31/2011 Radiation Therapy     02/10/2015 Procedure   Hysteroscopy with dilation and curettage by Dr. Josefa Half.    02/10/2015 Pathology Results   Diagnosis Endometrium, curettage - ABUNDANT BLOOD AND DEGENERATIVE SECRETORY ENDOMETRIUM WITH STROMAL BREAKDOWN (VERY LIMITED MATERIAL). - INFLAMED SQUAMOUS EPITHELIUM AND ENDOCERVICAL MUCOSA, NO DYSPLASIA OR MALIGNANCY.      CHIEF COMPLIANT: Follow-up for right breast cancer   INTERVAL HISTORY: [name] is a 55 y.o. female here today for follow up of her right breast cancer. Her last visit was on 04/28/2020.   Today she reports feeling okay. She reports soreness at her right lumpectomy site. She is taking vitamin D but not calcium.  REVIEW OF SYSTEMS:   Review of Systems  Constitutional:  Positive for fatigue (20%).  HENT:   Positive for trouble swallowing.   Respiratory:  Positive for shortness of breath.   Gastrointestinal:  Positive for constipation and nausea.  Musculoskeletal:  Positive for back pain (10/10) and myalgias (10/10).  Neurological:  Positive for headaches and numbness.  All other systems reviewed and are negative.  I have reviewed the past medical history, past surgical history, social history and family history with the patient and they are  unchanged from previous note.   ALLERGIES:   is allergic to flagyl [metronidazole], lansoprazole, and pravastatin.   MEDICATIONS:  Current Outpatient Medications  Medication Sig Dispense Refill   atorvastatin (LIPITOR) 40 MG tablet TAKE 1 TABLET BY MOUTH AT BEDTIME 90 tablet 0   blood glucose meter kit and supplies Dispense based on patient and insurance preference. Use up to four times daily as directed. (FOR ICD-10 E10.9, E11.9). 1 each 5   cetirizine (ZYRTEC) 10 MG tablet Take 1 tablet (10 mg total) by mouth daily. 30 tablet 3   citalopram (CELEXA) 40 MG tablet Take 1 tablet (40 mg total) by mouth daily. 90 tablet 1   Continuous Blood Gluc Receiver (FREESTYLE LIBRE 14 DAY READER) DEVI Use as directed. 1 each 0   Continuous Blood Gluc Sensor (FREESTYLE LIBRE 14 DAY SENSOR) MISC Use as directed 1 each 3   Dulaglutide (TRULICITY) 1.27 NT/7.0YF SOPN INJECT 0.75 MG SUBCUTANEOUSLY ONCE WEEKLY 4 mL 2   ergocalciferol (VITAMIN D2) 1.25 MG (50000 UT) capsule Take 1 capsule (50,000 Units total) by mouth once a week. 4 capsule 12   fluticasone (FLONASE) 50 MCG/ACT nasal spray Place 2 sprays into both nostrils daily. 16 g 2   HYDROcodone-acetaminophen (NORCO/VICODIN) 5-325 MG tablet Take 1 tablet by mouth 3 (three) times daily as needed for moderate pain. 15 tablet 0   linaclotide (LINZESS) 145 MCG CAPS capsule Take 1 capsule (145 mcg total) by mouth daily before breakfast. 90 capsule 3   lisinopril (ZESTRIL) 10 MG tablet Take one tablet po once daily 90 tablet 0  lubiprostone (AMITIZA) 8 MCG capsule Take 1 capsule (8 mcg total) by mouth 2 (two) times daily with a meal. 60 capsule 5   metFORMIN (GLUCOPHAGE-XR) 750 MG 24 hr tablet Take one tablet po once daily with breakfast 90 tablet 0   ondansetron (ZOFRAN-ODT) 4 MG disintegrating tablet Take 1 tablet (4 mg total) by mouth 3 (three) times daily before meals. As needed for nausea 90 tablet 3   pantoprazole (PROTONIX) 40 MG tablet Take 1 tablet (40  mg total) by mouth 2 (two) times daily before a meal. 180 tablet 3   No current facility-administered medications for this visit.     PHYSICAL EXAMINATION: Performance status (ECOG): 1 - Symptomatic but completely ambulatory  There were no vitals filed for this visit. Wt Readings from Last 3 Encounters:  04/14/21 242 lb 6.4 oz (110 kg)  03/21/21 243 lb 6.4 oz (110.4 kg)  02/02/21 238 lb 9.6 oz (108.2 kg)   Physical Exam Vitals reviewed.  Constitutional:      Appearance: Normal appearance.  Cardiovascular:     Rate and Rhythm: Normal rate and regular rhythm.     Pulses: Normal pulses.     Heart sounds: Normal heart sounds.  Pulmonary:     Effort: Pulmonary effort is normal.     Breath sounds: Normal breath sounds.  Chest:  Breasts:    Right: Normal. No inverted nipple, mass, nipple discharge, skin change or tenderness.     Left: Normal. No inverted nipple, mass, nipple discharge, skin change or tenderness.  Abdominal:     Palpations: Abdomen is soft. There is no hepatomegaly, splenomegaly or mass.     Tenderness: There is no abdominal tenderness.  Musculoskeletal:     Right lower leg: No edema.     Left lower leg: No edema.  Neurological:     General: No focal deficit present.     Mental Status: She is alert and oriented to person, place, and time.  Psychiatric:        Mood and Affect: Mood normal.        Behavior: Behavior normal.    Breast Exam Chaperone: Thana Ates     LABORATORY DATA:  I have reviewed the data as listed CMP Latest Ref Rng & Units 04/27/2021 01/12/2021 01/04/2021  Glucose 70 - 99 mg/dL 97 115(H) 132(H)  BUN 6 - 20 mg/dL $Remove'9 8 14  'bFJhVts$ Creatinine 0.44 - 1.00 mg/dL 0.90 0.93 0.87  Sodium 135 - 145 mmol/L 137 143 137  Potassium 3.5 - 5.1 mmol/L 3.7 3.6 3.5  Chloride 98 - 111 mmol/L 100 100 103  CO2 22 - 32 mmol/L $RemoveB'28 26 25  'TxMybHzk$ Calcium 8.9 - 10.3 mg/dL 8.7(L) 9.2 8.7(L)  Total Protein 6.5 - 8.1 g/dL 7.9 7.2 7.5  Total Bilirubin 0.3 - 1.2 mg/dL 0.5 0.3 0.9   Alkaline Phos 38 - 126 U/L 66 81 52  AST 15 - 41 U/L $Remo'18 17 22  'BjssR$ ALT 0 - 44 U/L $Remo'18 15 24   'QAyct$ No results found for: TOI712 Lab Results  Component Value Date   WBC 6.1 04/27/2021   HGB 12.9 04/27/2021   HCT 41.7 04/27/2021   MCV 89.7 04/27/2021   PLT 218 04/27/2021   NEUTROABS 3.4 04/27/2021    ASSESSMENT:  1.  Stage II (T2N0) right breast IDC: -Diagnosed in April 2012, ER negative, PR 2% positive, Ki-67 90%, HER-2 negative. -Status post lumpectomy followed by adjuvant chemotherapy with AC x4 followed by 12 weekly paclitaxel completed on 10/19/2011. -Adjuvant  breast radiation completed on 12/31/2011. -Tamoxifen was likely not recommended because of low positivity for PR. -Mammogram on 04/25/2020 was BI-RADS 2 category.   PLAN:  1.  Stage II (T2N0) right breast IDC: - She has some pains at the right lumpectomy site above the areola.  She had these pains since surgery.  Mostly nerve related. - No palpable masses or lymph nodes. - We discussed mammogram from 04/27/2021 which was BI-RADS Category 1. - Reviewed lab from 04/27/2021 which showed normal LFTs and CBC. - She was told that she may follow-up with her PMD.  However she would like to follow-up with [place].  RTC 1 year for follow-up with labs and mammogram.   2.  Neuropathy: - She has numbness.  She has used gabapentin in the past without benefit.  No neuropathic pains reported.   3.  Vitamin D deficiency: - Vitamin D level is 64. - Continue vitamin D 50,000 units weekly.  Breast Cancer therapy associated bone loss: I have recommended calcium, Vitamin D and weight bearing exercises.  Orders placed this encounter:  No orders of the defined types were placed in this encounter.   The patient has a good understanding of the overall plan. She agrees with it. She will call with any problems that may develop before the next visit here.  Derek Jack, MD [place] [phone]   I, Thana Ates, am acting as a  scribe for Dr. Derek Jack.  I, Derek Jack MD, have reviewed the above documentation for accuracy and completeness, and I agree with the above.

## 2021-05-04 ENCOUNTER — Ambulatory Visit (HOSPITAL_COMMUNITY): Payer: 59 | Admitting: Hematology

## 2021-05-04 ENCOUNTER — Inpatient Hospital Stay (HOSPITAL_BASED_OUTPATIENT_CLINIC_OR_DEPARTMENT_OTHER): Payer: 59 | Admitting: Hematology

## 2021-05-04 ENCOUNTER — Telehealth: Payer: Self-pay | Admitting: Internal Medicine

## 2021-05-04 VITALS — BP 120/64 | HR 81 | Resp 16 | Wt 239.0 lb

## 2021-05-04 DIAGNOSIS — C50911 Malignant neoplasm of unspecified site of right female breast: Secondary | ICD-10-CM | POA: Diagnosis not present

## 2021-05-04 DIAGNOSIS — Z853 Personal history of malignant neoplasm of breast: Secondary | ICD-10-CM | POA: Diagnosis not present

## 2021-05-04 NOTE — Patient Instructions (Signed)
[facility] at [facility] Discharge Instructions  You were seen today by Dr. Katragadda. He went over your recent results. Dr. Katragadda will see you back in 1 year for labs and follow up.   Thank you for choosing [facility] at [facility] to provide your oncology and hematology care.  To afford each patient quality time with our provider, please arrive at least 15 minutes before your scheduled appointment time.   If you have a lab appointment with the Cancer Center please come in thru the Main Entrance and check in at the main information desk  You need to re-schedule your appointment should you arrive 10 or more minutes late.  We strive to give you quality time with our providers, and arriving late affects you and other patients whose appointments are after yours.  Also, if you no show three or more times for appointments you may be dismissed from the clinic at the providers discretion.     Again, thank you for choosing [facility].  Our hope is that these requests will decrease the amount of time that you wait before being seen by our physicians.       _____________________________________________________________  Should you have questions after your visit to  Cancer Center, please contact our office at [phone] between the hours of 8:00 a.m. and 4:30 p.m.  Voicemails left after 4:00 p.m. will not be returned until the following business day.  For prescription refill requests, have your pharmacy contact our office and allow 72 hours.    Cancer Center Support Programs:   > Cancer Support Group  2nd Tuesday of the month 1pm-2pm, Journey Room    

## 2021-05-04 NOTE — Telephone Encounter (Signed)
Patient called inquiring about a medication we were trying to get approved for her.  Said she spoke to [place] last week

## 2021-05-05 ENCOUNTER — Encounter: Payer: Self-pay | Admitting: Physical Medicine and Rehabilitation

## 2021-05-05 ENCOUNTER — Other Ambulatory Visit: Payer: Self-pay | Admitting: Family Medicine

## 2021-05-05 ENCOUNTER — Other Ambulatory Visit: Payer: Self-pay

## 2021-05-05 ENCOUNTER — Other Ambulatory Visit (HOSPITAL_COMMUNITY): Payer: Self-pay | Admitting: Hematology

## 2021-05-05 ENCOUNTER — Other Ambulatory Visit: Payer: Self-pay | Admitting: *Deleted

## 2021-05-05 ENCOUNTER — Encounter: Payer: 59 | Attending: Physical Medicine and Rehabilitation | Admitting: Physical Medicine and Rehabilitation

## 2021-05-05 ENCOUNTER — Telehealth: Payer: Self-pay

## 2021-05-05 VITALS — BP 135/82 | HR 81 | Temp 98.1°F | Ht 64.0 in | Wt 239.0 lb

## 2021-05-05 DIAGNOSIS — F0631 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: Secondary | ICD-10-CM

## 2021-05-05 DIAGNOSIS — Z5181 Encounter for therapeutic drug level monitoring: Secondary | ICD-10-CM | POA: Diagnosis present

## 2021-05-05 DIAGNOSIS — Z79891 Long term (current) use of opiate analgesic: Secondary | ICD-10-CM | POA: Diagnosis present

## 2021-05-05 DIAGNOSIS — M5417 Radiculopathy, lumbosacral region: Secondary | ICD-10-CM | POA: Diagnosis present

## 2021-05-05 DIAGNOSIS — E1165 Type 2 diabetes mellitus with hyperglycemia: Secondary | ICD-10-CM

## 2021-05-05 DIAGNOSIS — G894 Chronic pain syndrome: Secondary | ICD-10-CM | POA: Diagnosis present

## 2021-05-05 DIAGNOSIS — Z1231 Encounter for screening mammogram for malignant neoplasm of breast: Secondary | ICD-10-CM

## 2021-05-05 MED ORDER — TRULICITY 0.75 MG/0.5ML ~~LOC~~ SOAJ: SUBCUTANEOUS | 0 refills | Status: DC

## 2021-05-05 MED ORDER — PREGABALIN 50 MG PO CAPS: 50.0000 mg | ORAL_CAPSULE | Freq: Two times a day (BID) | ORAL | 5 refills | Status: DC

## 2021-05-05 MED ORDER — TRULICITY 0.75 MG/0.5ML ~~LOC~~ SOAJ: SUBCUTANEOUS | 3 refills | Status: DC

## 2021-05-05 NOTE — Patient Instructions (Signed)
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  Also hx of lumbar surgery- been on Norco for "years".   Here for evaluation of chronic pain.   Happy to try Lyrica- 50 mg 2x/day x 15 days to 1 week, then increase to  100 mg 2x/day for nerve pain.    2. Has tried Tramadol in past and "doesn't work"- "did nothing".  3. Norco - caused abd pain/nausea- but she felt was due to not eating.   4. Needs UDS and opiate contract- cannot up the dose of pain medicine without a license. Went over rules/regulations of pain meds- alcohol does not mix and can go to ER to be seen if need be, but tel [place] if got  meds.    5. CT scan of lumbar spine. Pt claustrophobia- would prefer not to get MRI if possible.   6. Explained that takes a little while for nerve pain meds to work. If develops mild side effects- see if can get through- if not, we can try something difference.   7. Con't current Celexa- won't start Duloxetine due to this medicine.   8. F/U in 6 weeks-

## 2021-05-05 NOTE — Telephone Encounter (Signed)
Pt needs refill on Dulaglutide (TRULICITY) 9[address] [place], [place] [address] Pt supposed to have one today and she is completley out.   Pt call back [phone]

## 2021-05-05 NOTE — Progress Notes (Signed)
Subjective:    Patient ID: [name], female    DOB: 1966-09-13, 55 y.o.   MRN: [id number]  HPI Pt is a 55 yr old female with hx of DM,- A1c 6.0;  Anxiety, R breast invasive ductal cancer of R breast, neuropathy in hands and feet; and arthritis and pain in legs.  Also hx of lumbar surgery- been on Norco for "years".   Here for evaluation of chronic pain.   Hands and feet- neuropathy from chemo and radiation.  Cannot hold granddaughter because drop things.  Oncologist dx'd it-  Tried gabapentin- made her feel weird.  Dizzy/lightheaded "funny".   On Celexa- 40 mg daily.   Been taking Norco- as needed When hurts, gets to crying.  Norco made her stomach hurt- Not allergic to tramadol Tried tramadol- 2 tabs- didn't "do anything".    Pain- described-  Sometimes in neck and back- on fire-  And low back- goes down R>>L leg- just below the knee.  Sits on R side, can go numb.    Cannot get off bed, or out of car, hurts so bad.    Has been taken off Xanax- was on previously- but not in last 6 months.  Last Hydrocodone 03/30/21- and has gotten multiple Rx's since 12/21. Usually 15-20 tabs at a time.  Knocks the edge off.- not more than that.    Last imaging 7/20 was ray of lumbar spine- has degenerative disc disease and facet arthritis.   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     Pain Inventory Average Pain 8 Pain Right Now 9 My pain is sharp, stabbing, and aching  In the last 24 hours, has pain interfered with the following? General activity 7 Relation with others 0 Enjoyment of life 4 What TIME of day is your pain at its worst? varies Sleep (in general)  varies  Pain is worse with: walking, bending, sitting, inactivity, standing, and some activites Pain improves with:  none Relief from Meds: 0  walk without assistance do you drive?  yes  employed # of hrs/week  30  bladder control problems weakness numbness trouble walking dizziness depression anxiety  new  new    Family History  Problem Relation Age of Onset   Colon cancer Mother        mid-50s, died of metastatic disease   Cancer Mother        liver   Coronary artery disease Mother    Diabetes type I Mother    Peripheral vascular disease Mother        Carotid disease in her 61s   Coronary artery disease Father        CAD in his 2s   Diabetes Father    Diabetes type I Father    Hypertension Father    Bipolar disorder Sister    Coronary artery disease Brother        MI in his 28s   Hypertension Brother    Hypertension Maternal Grandmother    Hyperlipidemia Maternal Grandmother    Stroke Maternal Grandmother    Hypertension Maternal Grandfather    Diabetes Maternal Grandfather    Diabetes Paternal Grandmother    Heart disease Paternal Grandfather    Social History   Socioeconomic History   Marital status: Married    Spouse name: Not on file   Number of children: 3   Years of education: Not on file  Highest education level: Not on file  Occupational History   Occupation: child care    Employer: [employer]  Tobacco Use   Smoking status: Never   Smokeless tobacco: Never  Vaping Use   Vaping Use: Never used  Substance and Sexual Activity   Alcohol use: No    Alcohol/week: 0.0 standard drinks   Drug use: No   Sexual activity: Yes    Partners: Male    Birth control/protection: None  Other Topics Concern   Not on file  Social History Narrative   Not on file   Social Determinants of Health   Financial Resource Strain: Not on file  Food Insecurity: Not on file  Transportation Needs: Not on file  Physical Activity: Not on file  Stress: Not on file  Social Connections: Not on file   Past Surgical History:  Procedure Laterality Date   back surg x2     BREAST LUMPECTOMY Right 2012   CHOLECYSTECTOMY     COLONOSCOPY  11/03/07   4-mm sessile  polyp removed/small internal hemorrhoids/tubular adenoma, random colon bx negative for microscopic colitis   COLONOSCOPY WITH PROPOFOL N/A 09/17/2016   Grade 2 hemorrhoids, colonic diverticulosis, ascending colon polyp (sessile serrated adenoma), 5 year surveillance   COLONOSCOPY, ESOPHAGOGASTRODUODENOSCOPY (EGD) AND ESOPHAGEAL DILATION  01/2012   mild gastritis s/p biopsy. Empiric dilation. Normal duodenum.    DILATATION & CURETTAGE/HYSTEROSCOPY WITH MYOSURE N/A 02/10/2015   Procedure: DILATATION & CURETTAGE/HYSTEROSCOPY WITH MYOSURE;  Surgeon: Nunzio Cobbs, MD;  Location: [facility];  Service: Gynecology;  Laterality: N/A;   ESOPHAGOGASTRODUODENOSCOPY (EGD) WITH PROPOFOL N/A 01/11/2020   Normal esophagus, s/p dilation, normal stomach, normal duodenum.    MALONEY DILATION N/A 01/11/2020   Procedure: Venia Minks DILATION;  Surgeon: Daneil Dolin, MD;  [facility];  Service: Endoscopy;  Laterality: N/A;   MM BREAST STEREO BX*L*R/S     rt.   POLYPECTOMY  09/17/2016   Procedure: POLYPECTOMY;  Surgeon: Daneil Dolin, MD;  Location: [facility];  Service: Endoscopy;;  ascending colon   PORT-A-CATH REMOVAL  05/26/2012   Procedure: REMOVAL PORT-A-CATH;  Surgeon: Jamesetta So, MD;  Location: [facility];  Service: General;  Laterality: N/A;  Minor Room   PORTACATH PLACEMENT     Past Medical History:  Diagnosis Date   Anxiety    Blood transfusion without reported diagnosis 1999   due to heavy menses   Breast cancer ([place]) 05/22/2011   03/01/11, Stage 2, s/p lumpectomy, chemo/xrt   DDD (degenerative disc disease), lumbar    Depression 06/22/2011   Diverticulitis    DM (diabetes mellitus) ([place]) 06/22/2011   Genital warts    GERD (gastroesophageal reflux disease) 06/22/2011   Hx of adenomatous colonic polyps 10/2007   81mm sigmoid tubular adenoma, FH colon cancer, mother in mid-50s   Hypercholesterolemia 06/22/2011   Hypertension 06/22/2011   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[place])  05/22/2011   Iron deficiency anemia 03/25/2015   Morbid obesity ([place])    Shortness of breath    STD (sexually transmitted disease)    HPV, 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   Vaginal bleeding 03/08/2014   BP 135/82   Pulse 81   Temp 98.1 F (36.7 C)   Ht 5\' 4"  (1.626 m)   Wt 239 lb (108.4 kg)   LMP 04/05/2016 Comment: spotting 11/2016 and 12/2016  SpO2 96%   BMI 41.02 kg/m   Opioid Risk Score:   Fall Risk Score:  `1  Depression screen PHQ 2/9  Depression screen [facility] 2/9 04/14/2021 10/04/2020  Decreased Interest 2 1  Down, Depressed, Hopeless 2 1  PHQ - 2 Score 4 2  Altered sleeping 2 1  Tired, decreased energy 2 2  Change in appetite 1 0  Feeling bad or failure about yourself  1 1  Trouble concentrating 3 1  Moving slowly or fidgety/restless 3 2  Suicidal thoughts 0 0  PHQ-9 Score 16 9  Difficult doing work/chores Extremely dIfficult Somewhat difficult  Some recent data might be hidden    Review of Systems  Constitutional: Negative.   HENT: Negative.    Eyes: Negative.   Respiratory: Negative.    Cardiovascular: Negative.   Gastrointestinal:  Positive for nausea.  Endocrine: Negative.        High blood sugar  Genitourinary: Negative.   Musculoskeletal:  Positive for arthralgias, back pain, gait problem and neck pain.  Skin: Negative.   Allergic/Immunologic: Negative.   Neurological:  Positive for weakness and numbness.       Tingling   Hematological: Negative.   Psychiatric/Behavioral:  Positive for dysphoric mood. The patient is nervous/anxious.   All other systems reviewed and are negative.     Objective:   Physical Exam  Awake, alert, BMI 41; sitting on table, NAD Neuro: No hoffman's B/L DTRs 3+ on R patella; otherwise 2+ in rest of extremities   MS: UE- deltoid- 4+/5, biceps 4+/5, WE 4/5- pain? Not clear why; Grip 4+/5 B/L; and finger abd 5-/5 B/L Decrease effort vs pain vs actual weakness? LE- HF 2/5 BL , KE 4-/5 B/L; DF- couldn't do initially- then ~  2+/5 B/L; PF 4/5 B/L  based on strength exam, but walking pretty well although mildly shuffling gait.  Explained to pt that walking exam c/w at least 3+/5 strength- it was not on MS testing     Assessment & Plan:   Pt is a 55 yr old female with hx of DM,- A1c 6.0;  Anxiety, R breast invasive ductal cancer of R breast, neuropathy in hands and feet; and arthritis and pain in legs.  Also hx of lumbar surgery- been on Norco for "years".   Here for evaluation of chronic pain.   Happy to try Lyrica- 50 mg 2x/day x 15 days to 1 week, then increase to  100 mg 2x/day for nerve pain.    2. Has tried Tramadol in past and "doesn't work"- "did nothing".  3. Norco - caused abd pain/nausea- but she felt was due to not eating.   4. Needs UDS and opiate contract- cannot up the dose of pain medicine without a license. Went over rules/regulations of pain meds- alcohol does not mix and can go to ER to be seen if need be, but tel [place] if got  meds.    5. CT scan of lumbar spine. Pt claustrophobia- would prefer not to get MRI if possible.   6. Explained that takes a little while for nerve pain meds to work. If develops mild side effects- see if can get through- if not, we can try something difference.   7. Con't current Celexa- won't start Duloxetine due to this medicine.   8. F/U in 6 weeks-     I spent a total of 52 minutes on appointment- discussing pain options, contract, etc.

## 2021-05-05 NOTE — Telephone Encounter (Signed)
Refill sent per protocol and pt was notified

## 2021-05-08 ENCOUNTER — Telehealth: Payer: Self-pay

## 2021-05-08 ENCOUNTER — Telehealth: Payer: Self-pay | Admitting: Physical Medicine and Rehabilitation

## 2021-05-08 NOTE — Telephone Encounter (Signed)
Patient needs to have MRI done at [facility].  [facility] is not participating with her insurance plan.  The hospital doesn't have any open MRI's, that may be an issue as well. The referral in system will need to be changed to [facility] if patient is willing to not have an open MRI.

## 2021-05-08 NOTE — Telephone Encounter (Signed)
Can you check who DOES have open MRI and let me know where to send it? That would be great- because they don't advertise to me, at least, who has open MRI- thanks, ML

## 2021-05-08 NOTE — Telephone Encounter (Signed)
Pt called checking on her PA for Amitiza which was denied also. Pt was given Linzess 145 mg samples until I can appeal.

## 2021-05-10 NOTE — Telephone Encounter (Signed)
I have contacted patient in reference to her MRI.  Her insurance only covers our facility or [facility], unfortunately neither has an open MRI.  I explained to patient if she wanted an open MRI it would be an out of pocket expense for her and she can't afford that.  She was ok for [place] to go ahead and send her to [facility] MRI and she said she would deal with it not being an open MRI.  I am going to send request for MRI to her insurance so that we can get auth for MRI.

## 2021-05-13 LAB — TOXASSURE SELECT,+ANTIDEPR,UR

## 2021-05-15 ENCOUNTER — Telehealth: Payer: Self-pay | Admitting: *Deleted

## 2021-05-15 NOTE — Telephone Encounter (Signed)
Urine drug screen for this encounter is consistent for prescribed medication 

## 2021-05-21 ENCOUNTER — Emergency Department (HOSPITAL_COMMUNITY): Payer: 59

## 2021-05-21 ENCOUNTER — Encounter (HOSPITAL_COMMUNITY): Payer: Self-pay | Admitting: Emergency Medicine

## 2021-05-21 ENCOUNTER — Emergency Department (HOSPITAL_COMMUNITY)
Admission: EM | Admit: 2021-05-21 | Discharge: 2021-05-21 | Disposition: A | Discharge status: Home / Self Care | Payer: 59 | Attending: Emergency Medicine | Admitting: Emergency Medicine

## 2021-05-21 ENCOUNTER — Other Ambulatory Visit: Payer: Self-pay

## 2021-05-21 DIAGNOSIS — Z853 Personal history of malignant neoplasm of breast: Secondary | ICD-10-CM | POA: Diagnosis not present

## 2021-05-21 DIAGNOSIS — I10 Essential (primary) hypertension: Secondary | ICD-10-CM | POA: Diagnosis not present

## 2021-05-21 DIAGNOSIS — W19XXXA Unspecified fall, initial encounter: Secondary | ICD-10-CM

## 2021-05-21 DIAGNOSIS — Y9301 Activity, walking, marching and hiking: Secondary | ICD-10-CM | POA: Insufficient documentation

## 2021-05-21 DIAGNOSIS — M25512 Pain in left shoulder: Secondary | ICD-10-CM | POA: Insufficient documentation

## 2021-05-21 DIAGNOSIS — Z7984 Long term (current) use of oral hypoglycemic drugs: Secondary | ICD-10-CM | POA: Insufficient documentation

## 2021-05-21 DIAGNOSIS — M25552 Pain in left hip: Secondary | ICD-10-CM | POA: Insufficient documentation

## 2021-05-21 DIAGNOSIS — M545 Low back pain, unspecified: Secondary | ICD-10-CM | POA: Insufficient documentation

## 2021-05-21 DIAGNOSIS — Z79899 Other long term (current) drug therapy: Secondary | ICD-10-CM | POA: Diagnosis not present

## 2021-05-21 DIAGNOSIS — E1140 Type 2 diabetes mellitus with diabetic neuropathy, unspecified: Secondary | ICD-10-CM | POA: Insufficient documentation

## 2021-05-21 DIAGNOSIS — Y92000 Kitchen of unspecified non-institutional (private) residence as  the place of occurrence of the external cause: Secondary | ICD-10-CM | POA: Diagnosis not present

## 2021-05-21 DIAGNOSIS — W1830XA Fall on same level, unspecified, initial encounter: Secondary | ICD-10-CM | POA: Diagnosis not present

## 2021-05-21 MED ORDER — OXYCODONE-ACETAMINOPHEN 5-325 MG PO TABS
1.0000 | ORAL_TABLET | Freq: Once | ORAL | Status: AC
  Administered 2021-05-21: 1 via ORAL
  Filled 2021-05-21: qty 1

## 2021-05-21 MED ORDER — PREDNISONE 10 MG PO TABS: 30.0000 mg | ORAL_TABLET | Freq: Every day | ORAL | 0 refills | Status: AC

## 2021-05-21 MED ORDER — OXYCODONE-ACETAMINOPHEN 5-325 MG PO TABS: 1.0000 | ORAL_TABLET | Freq: Three times a day (TID) | ORAL | 0 refills | Status: AC | PRN

## 2021-05-21 NOTE — ED Triage Notes (Signed)
Pt states she fell in her home today. Denies LOC and blood thinners but she did hit her head.  Pt is complaining of back pain and left shoulder pain.

## 2021-05-21 NOTE — Discharge Instructions (Addendum)
You have been seen here for shoulder and back pain.  I have given you prescription for pain medication please beware this medication make you drowsy do not consume alcohol or operate heavy treatment take this medication.  This medication Tylenol in it do not take Tylenol when taking this medication.   given you steroids please take as prescribed.  I recommend taking over-the-counter pain medications like ibuprofen and/or Tylenol every 6 as needed.  Please follow dosage and on the back of bottle.  I also recommend applying heat to the area and stretching out the muscles as this will help decrease stiffness and pain.  I have given you information on exercises please follow.  I placed you in a splint for your left shoulder please wear for a day or so and then take off as I do not want to have a frozen shoulder.  Please follow-up with orthopedic surgery and your neurosurgeon for further evaluation.  Come back to the emergency department if you develop chest pain, shortness of breath, severe abdominal pain, uncontrolled nausea, vomiting, diarrhea.

## 2021-05-21 NOTE — ED Provider Notes (Signed)
[facility] EMERGENCY DEPARTMENT Provider Note   CSN: [id number] Arrival date & time: 05/21/21  1327     History Chief Complaint  Patient presents with   [name]    MAHIMA HOTTLE is a 55 y.o. female.  HPI  Patient with significant medical history of breast cancer, DVT, diverticulitis, GERD, hypertension presents with chief complaint of left shoulder and lower back pain.  Patient states this started today after she tripped and fell walking in the kitchen, states she fell onto her left side, the wind was knocked out of her.  She states she bumped her head but denies losing conscious, is not on anticoagulant.  Patient states she was able to get up on her own accord, but has severe pain in her left shoulder and lower back.  She denies headaches, change in vision, paresthesias and/or weakness in the upper or lower extremities, patient denies urinary incontinence, urinary retention, difficulty with bowel movements, denies any saddle paresthesias.  Patient denies any chest pain, shortness of breath, abdominal pain.  She has not taken any pain medication for this, she denies any alleviating factors.  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   Iron deficiency anemia 03/25/2015   Morbid obesity ([place])    Shortness of breath    STD (sexually transmitted disease)    HPV, 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   Vaginal bleeding 03/08/2014     Patient Active Problem List   Diagnosis Date Noted   Constipation 03/21/2021   Degenerative disc disease, lumbar 02/23/2021   Seasonal allergies 02/02/2021   Diabetes mellitus without complication ([place]) 43/56/8616   Nausea without vomiting 12/22/2020   Lipoma of right upper extremity 10/27/2020   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   Abdominal pain 12/22/2019   Central adiposity 04/12/2018   Seasonal affective disorder ([place]) 12/15/2017   Chest pain 02/27/2017   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   Panic disorder 01/09/2017   Rectal bleeding 09/06/2016   Abdominal cramping 09/06/2016   Fatty liver 09/06/2016   Dehydration 12/15/2015   Orthostatic dizziness 12/15/2015   Nausea vomiting and diarrhea 12/15/2015   Iron deficiency anemia 03/25/2015   Excessive or frequent menstruation 04/08/2014   Dysmenorrhea 04/08/2014   H/O adenomatous polyp of colon 01/24/2012   FH: colon cancer 01/24/2012   Esophageal dysphagia 01/24/2012   Chronic diarrhea 01/24/2012   Depression 06/22/2011   GERD (gastroesophageal reflux disease) 06/22/2011   Type 2 diabetes mellitus with hyperglycemia, without long-term current use of insulin ([place]) 06/22/2011   Hypercholesterolemia 06/22/2011   Hypertension 06/22/2011   Obesity 06/22/2011   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 Empiric dilation. Normal duodenum.    DILATATION & CURETTAGE/HYSTEROSCOPY WITH MYOSURE N/A 02/10/2015   Procedure: DILATATION & CURETTAGE/HYSTEROSCOPY WITH MYOSURE;  Surgeon: Nunzio Cobbs, MD;  Location: [facility];  Service: Gynecology;  Laterality: N/A;   ESOPHAGOGASTRODUODENOSCOPY (EGD) WITH PROPOFOL N/A 01/11/2020   Normal esophagus, s/p dilation, normal stomach, normal duodenum.    MALONEY DILATION N/A 01/11/2020   Procedure: Venia Minks DILATION;  Surgeon: Daneil Dolin, MD;  [facility];  Service: Endoscopy;  Laterality: N/A;   MM BREAST STEREO BX*L*R/S     rt.   POLYPECTOMY  09/17/2016   Procedure: POLYPECTOMY;  Surgeon: Daneil Dolin, MD;  Location: [facility];  Service: Endoscopy;;  ascending colon   PORT-A-CATH REMOVAL  05/26/2012   Procedure: REMOVAL PORT-A-CATH;  Surgeon: Jamesetta So, MD;  Location: [facility];  Service: General;  Laterality: N/A;  Minor Room   PORTACATH PLACEMENT       OB History     Gravida  4   Para  3   Term  0   Preterm  0   AB  1   Living  3      SAB  0   IAB  0   Ectopic  0   Multiple  0   Live Births  3           Family History  Problem Relation Age of Onset   Colon cancer Mother        mid-50s, died of metastatic disease   Cancer Mother        liver   Coronary artery disease Mother    Diabetes type I Mother    Peripheral vascular disease Mother        Carotid disease in her 38s   Coronary artery disease Father        CAD in his 25s   Diabetes Father    Diabetes type I Father    Hypertension Father    Bipolar disorder Sister    Coronary artery disease Brother        MI in his 64s   Hypertension Brother    Hypertension Maternal Grandmother    Hyperlipidemia Maternal Grandmother    Stroke Maternal Grandmother    Hypertension Maternal Grandfather    Diabetes Maternal Grandfather    Diabetes Paternal Grandmother    Heart disease Paternal Grandfather      Social History   Tobacco Use   Smoking status: Never   Smokeless tobacco: Never  Vaping Use   Vaping Use: Never used  Substance Use Topics   Alcohol use: No    Alcohol/week: 0.0 standard drinks   Drug use: No    Home Medications Prior to Admission medications   Medication Sig Start Date End Date Taking? Authorizing Provider  oxyCODONE-acetaminophen (PERCOCET/ROXICET) 5-325 MG tablet Take 1 tablet by mouth every 8 (eight) hours as needed for up to 3 days for severe pain. 05/21/21 05/24/21 Yes Marcello Fennel, PA-C  predniSONE (DELTASONE) 10 MG tablet Take 3 tablets (30 mg total) by mouth daily for 4 days. 05/21/21 05/25/21 Yes Marcello Fennel, PA-C  atorvastatin (LIPITOR)  40 MG tablet TAKE 1 TABLET BY MOUTH AT BEDTIME 11/24/20   Lovena Le, Malena M, DO  blood glucose meter kit and supplies Dispense based on patient and insurance preference. Use up to four times daily as directed. (FOR ICD-10 E10.9, E11.9). 01/05/20   Mikey Kirschner, MD  cetirizine (ZYRTEC) 10 MG tablet Take 1 tablet (10 mg total) by mouth daily. 02/02/21   Chalmers Guest, NP  citalopram (CELEXA) 40 MG tablet Take 1 tablet (40 mg total) by mouth daily. 04/14/21   Erven Colla, DO  Continuous Blood Gluc Receiver (FREESTYLE LIBRE 14 DAY READER) DEVI Use as directed. 01/12/21   Erven Colla, DO  Continuous Blood Gluc Sensor (FREESTYLE LIBRE 14 DAY SENSOR) MISC Use as directed 01/12/21   Elvia Collum M, DO  Dulaglutide (TRULICITY) 4.33 IR/5.1OA SOPN INJECT 0.75 MG SUBCUTANEOUSLY ONCE WEEKLY 05/05/21   Elvia Collum M, DO  ergocalciferol (VITAMIN D2) 1.25 MG (50000 UT) capsule Take 1 capsule (50,000 Units total) by mouth once a week. 02/06/21   Derek Jack, MD  fluticasone (FLONASE) 50 MCG/ACT nasal spray Place 2 sprays into both nostrils daily. 02/02/21   Chalmers Guest, NP  lisinopril (ZESTRIL) 10 MG tablet Take one tablet po once daily 04/19/21   Elvia Collum M, DO  metFORMIN (GLUCOPHAGE-XR) 750 MG 24 hr  tablet Take one tablet po once daily with breakfast 04/19/21   Lovena Le, Malena M, DO  ondansetron (ZOFRAN-ODT) 4 MG disintegrating tablet Take 1 tablet (4 mg total) by mouth 3 (three) times daily before meals. As needed for nausea 03/21/21   Annitta Needs, NP  pantoprazole (PROTONIX) 40 MG tablet Take 1 tablet (40 mg total) by mouth 2 (two) times daily before a meal. 03/21/21   Annitta Needs, NP  pregabalin (LYRICA) 50 MG capsule Take 1 capsule (50 mg total) by mouth 2 (two) times daily. X 1 week, then 100 mg 2x/day-for back and nerve pain 05/05/21   Lovorn, Jinny Blossom, MD    Allergies    Flagyl [metronidazole], Lansoprazole, and Pravastatin  Review of Systems   Review of Systems  Constitutional:  Negative for chills and fever.  HENT:  Negative for congestion.   Respiratory:  Negative for shortness of breath.   Cardiovascular:  Negative for chest pain.  Gastrointestinal:  Negative for abdominal pain.  Genitourinary:  Negative for enuresis.  Musculoskeletal:  Positive for back pain. Negative for neck pain.       Left shoulder pain.  Skin:  Negative for rash.  Neurological:  Negative for dizziness and headaches.  Hematological:  Does not bruise/bleed easily.   Physical Exam Updated Vital Signs BP 135/81 (BP Location: Right Arm)   Pulse 95   Temp 98.6 F (37 C) (Oral)   Resp 17   Ht $R'5\' 4"'Co$  (1.626 m)   Wt 109.8 kg   LMP 04/05/2016 Comment: spotting 11/2016 and 12/2016  SpO2 96%   BMI 41.54 kg/m   Physical Exam Vitals and nursing note reviewed.  Constitutional:      General: She is not in acute distress.    Appearance: She is not ill-appearing.  HENT:     Head: Normocephalic and atraumatic.     Nose: No congestion.  Eyes:     Extraocular Movements: Extraocular movements intact.     Conjunctiva/sclera: Conjunctivae normal.  Cardiovascular:     Rate and Rhythm: Normal rate and regular rhythm.     Pulses: Normal pulses.     Heart sounds: No murmur heard.  No friction rub. No gallop.   Pulmonary:     Effort: No respiratory distress.     Breath sounds: No wheezing, rhonchi or rales.  Musculoskeletal:     Cervical back: No tenderness.     Right lower leg: No edema.     Left lower leg: No edema.     Comments: Patient spine was palpated, she has no tenderness along her lumbar spine no step-off or deformities present.  Patient is able to move her lower extremities without difficulty neurovascular fully intact.  Patient's left shoulder was palpated she had tenderness along the anterior aspect of her shoulder, there is no noted defects, patient was able to move at her elbow wrist and fingers without difficulty, neurovascular fully intact.  Chest was nontender to palpation.  Skin:    General: Skin is warm and dry.  Neurological:     Mental Status: She is alert.  Psychiatric:        Mood and Affect: Mood normal.    ED Results / Procedures / Treatments   Labs (all labs ordered are listed, but only abnormal results are displayed) Labs Reviewed - No data to display  EKG None  Radiology CT Lumbar Spine Wo Contrast  Result Date: 05/21/2021 CLINICAL DATA:  Low back pain, fall. EXAM: CT LUMBAR SPINE WITHOUT CONTRAST TECHNIQUE: Multidetector CT imaging of the lumbar spine was performed without intravenous contrast administration. Multiplanar CT image reconstructions were also generated. COMPARISON:  CT abdomen 01/04/2021 FINDINGS: Segmentation: The L5 vertebral level is fused to the sacrum. The L5-S1 facet joints are fused. Alignment: 3 mm grade 1 degenerative anterolisthesis at L4-5. Vertebrae: No fracture or acute bony findings. Paraspinal and other soft tissues: Bilateral nonobstructive nephrolithiasis. Aortoiliac atherosclerotic vascular disease. Cholecystectomy. Disc levels: T12-L1: Unremarkable. L1-2: Unremarkable. L2-3: No impingement.  Mild degenerative left facet arthropathy. L3-4: Moderate central narrowing of the thecal sac with mild to moderate right and mild left foraminal  stenosis due to disc bulge and facet arthropathy. L4-5: Moderate central narrowing of the thecal sac with mild to moderate bilateral foraminal stenosis due to disc bulge and facet arthropathy. L5-S1: Mild bilateral foraminal stenosis due to facet spurring. Prior laminectomies at this level. IMPRESSION: 1. Lumbar spondylosis and degenerative disc disease, causing moderate impingement at L3-4 and L4-5, and mild impingement at L5-S1, as detailed above. 2. Prior fusion at L5-S1. 3. Bilateral nonobstructive nephrolithiasis. 4.  Aortic Atherosclerosis (ICD10-I70.0). Electronically Signed   By: Van Clines M.D.   On: 05/21/2021 16:19   DG Shoulder Left  Result Date: 05/21/2021 CLINICAL DATA:  Left shoulder pain after fall today. EXAM: LEFT SHOULDER - 2+ VIEW COMPARISON:  None. FINDINGS: There is no evidence of fracture or dislocation. Severe degenerative changes seen involving the left acromioclavicular joint. Soft tissues are unremarkable. 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 No acute abnormality seen. Electronically Signed   By: Marijo Conception M.D.   On: 05/21/2021 16:22    Procedures Procedures   Medications Ordered in ED Medications  oxyCODONE-acetaminophen (PERCOCET/ROXICET) 5-325 MG per tablet 1 tablet (1 tablet Oral Given 05/21/21 1627)    ED Course  I have reviewed the triage vital signs and the nursing notes.  Pertinent labs & imaging results that were available during my care of the patient were reviewed by me and considered in my medical decision making (see chart for details).    MDM Rules/Calculators/A&P  Initial impression-patient presents withLeft shoulder and lower back pain.  She is alert, does not appear to be in acute stress, vital signs reassuring.  Will obtain imaging provide patient with pain medication and reassess.  Work-up-x-ray of left shoulderShows severe degenerative disease of the left AC joint.   Lumbar spine shows lumbar spondylosis with degenerative disc disease, causes moderate impingement at L3-L4 L4-L5.  No other acute abnormalities present.  Rule out- Low suspicion for intracranial head bleed as patient denies headaches, change in vision, paresthesias or weakness in the upper or lower extremities.  Low suspicion for spinal cord abnormality or spinal fracture as there is no gross deformities present my exam, patient able to move all 4 extremities, imaging negative for acute findings.  low suspicion for rib fracture or pneumothorax as lung sounds are clear bilaterally, chest is nontender to palpation.  Low suspicion for dislocation or fracture of the left shoulder as imaging is negative for acute findings.  Plan-  Left hip and lower back pain suspect muscular nature, will provide patient with pain medications, recommend following up with PCP as needed for further evaluation.  Vital signs have remained stable, no indication for hospital admission.  Patient given at home care as well strict return precautions.  Patient verbalized that they understood agreed to said plan.  Final Clinical Impression(s) / ED Diagnoses Final diagnoses:  Fall, initial encounter    Rx / DC Orders ED Discharge Orders          Ordered    oxyCODONE-acetaminophen (PERCOCET/ROXICET) 5-325 MG tablet  Every 8 hours PRN        05/21/21 1658    predniSONE (DELTASONE) 10 MG tablet  Daily        05/21/21 1659             Aron Baba 05/21/21 1701    Hayden Rasmussen, MD 05/22/21 1049

## 2021-05-21 NOTE — ED Notes (Signed)
Pt ambulatory to room but c/o left sided pain especially to arm and back

## 2021-05-23 ENCOUNTER — Telehealth: Payer: Self-pay

## 2021-05-23 NOTE — Telephone Encounter (Signed)
[name] called to report she was seen in the ER for a shoulder injury. Patient was given Oxycodone 5/325 for pain.

## 2021-05-24 ENCOUNTER — Telehealth: Payer: Self-pay | Admitting: Gastroenterology

## 2021-05-24 ENCOUNTER — Other Ambulatory Visit: Payer: Self-pay | Admitting: Family Medicine

## 2021-05-24 DIAGNOSIS — J302 Other seasonal allergic rhinitis: Secondary | ICD-10-CM

## 2021-05-24 NOTE — Telephone Encounter (Signed)
PATIENT CAME BY INQUIRING ABOUT THE STATUS OF HER LINZESS PRESCRIPTION.  SAID SHE ONLY HAS A FEW PILLS LEFT FROM HER SAMPLES

## 2021-05-24 NOTE — Telephone Encounter (Signed)
Phoned the pt and LMOVM for her regarding her Linzess PA and appeal has been denied and we will have to find another medication that hopefully will help her.  Tried to find one on her formulary, but will try it again later today.

## 2021-05-24 NOTE — Telephone Encounter (Signed)
This pt's insurance is set up with a portal that the Dr has to go on which there is a form that has the formulary drugs on it. You would have to go to [email or web address], pick your state, go to provider forms and rules, then go to exceptions, benefit request and attach the pt's notes if you want to try and get this Linzess. Or you can just start back over with this pt and do trials until we find something that works for her. Your call.

## 2021-05-25 NOTE — Telephone Encounter (Signed)
Please provide her with additional samples while we figure this out. You and I can get together Monday so I can have time to do this; I have not had to do that before, so I will need to look at it with you.

## 2021-05-25 NOTE — Telephone Encounter (Signed)
Noted   Phoned and LMOVM of the pt to come by the office and to pick up some samples of Linzess 290 mcg left up front. Pt called before I finished this note and was advised the same. Advised that Roseanne Kaufman and myself will work on it together Monday.

## 2021-05-30 ENCOUNTER — Encounter: Payer: Self-pay | Admitting: Orthopaedic Surgery

## 2021-05-30 ENCOUNTER — Other Ambulatory Visit: Payer: Self-pay

## 2021-05-30 ENCOUNTER — Ambulatory Visit: Payer: 59

## 2021-05-30 ENCOUNTER — Ambulatory Visit: Payer: 59 | Admitting: Orthopaedic Surgery

## 2021-05-30 VITALS — BP 154/104 | HR 103 | Ht 64.0 in | Wt 238.0 lb

## 2021-05-30 DIAGNOSIS — M542 Cervicalgia: Secondary | ICD-10-CM | POA: Diagnosis not present

## 2021-05-30 DIAGNOSIS — M25512 Pain in left shoulder: Secondary | ICD-10-CM | POA: Diagnosis not present

## 2021-05-30 NOTE — Patient Instructions (Addendum)
Do home exercises Use Aspercreme, Biofreeze or Voltaren gel over the counter 2-3 times daily make sure you rub it in well each time you use it.     Shoulder Exercises Ask your health care provider which exercises are safe for you. Do exercises exactly as told by your health care provider and adjust them as directed. It is normal to feel mild stretching, pulling, tightness, or discomfort as you do these exercises. Stop right away if you feel sudden pain or your pain gets worse. Do not begin these exercises until told by your health care provider. Stretching exercises External rotation and abduction This exercise is sometimes called corner stretch. This exercise rotates your arm outward (external rotation) and moves your arm out from your body (abduction). Stand in a doorway with one of your feet slightly in front of the other. This is called a staggered stance. If you cannot reach your forearms to the door frame, stand facing a corner of a room. Choose one of the following positions as told by your health care provider: Place your hands and forearms on the door frame above your head. Place your hands and forearms on the door frame at the height of your head. Place your hands on the door frame at the height of your elbows. Slowly move your weight onto your front foot until you feel a stretch across your chest and in the front of your shoulders. Keep your head and chest upright and keep your abdominal muscles tight. Hold for __________ seconds. To release the stretch, shift your weight to your back foot. Repeat __________ times. Complete this exercise __________ times a day. Extension, standing Stand and hold a broomstick, a cane, or a similar object behind your back. Your hands should be a little wider than shoulder width apart. Your palms should face away from your back. Keeping your elbows straight and your shoulder muscles relaxed, move the stick away from your body until you feel a stretch in  your shoulders (extension). Avoid shrugging your shoulders while you move the stick. Keep your shoulder blades tucked down toward the middle of your back. Hold for __________ seconds. Slowly return to the starting position. Repeat __________ times. Complete this exercise __________ times a day. Range-of-motion exercises Pendulum  Stand near a wall or a surface that you can hold onto for balance. Bend at the waist and let your left / right arm hang straight down. Use your other arm to support you. Keep your back straight and do not lock your knees. Relax your left / right arm and shoulder muscles, and move your hips and your trunk so your left / right arm swings freely. Your arm should swing because of the motion of your body, not because you are using your arm or shoulder muscles. Keep moving your hips and trunk so your arm swings in the following directions, as told by your health care provider: Side to side. Forward and backward. In clockwise and counterclockwise circles. Continue each motion for __________ seconds, or for as long as told by your health care provider. Slowly return to the starting position. Repeat __________ times. Complete this exercise __________ times a day. Shoulder flexion, standing  Stand and hold a broomstick, a cane, or a similar object. Place your hands a little more than shoulder width apart on the object. Your left / right hand should be palm up, and your other hand should be palm down. Keep your elbow straight and your shoulder muscles relaxed. Push the stick up  with your healthy arm to raise your left / right arm in front of your body, and then over your head until you feel a stretch in your shoulder (flexion). Avoid shrugging your shoulder while you raise your arm. Keep your shoulder blade tucked down toward the middle of your back. Hold for __________ seconds. Slowly return to the starting position. Repeat __________ times. Complete this exercise __________  times a day. Shoulder abduction, standing Stand and hold a broomstick, a cane, or a similar object. Place your hands a little more than shoulder width apart on the object. Your left / right hand should be palm up, and your other hand should be palm down. Keep your elbow straight and your shoulder muscles relaxed. Push the object across your body toward your left / right side. Raise your left / right arm to the side of your body (abduction) until you feel a stretch in your shoulder. Do not raise your arm above shoulder height unless your health care provider tells you to do that. If directed, raise your arm over your head. Avoid shrugging your shoulder while you raise your arm. Keep your shoulder blade tucked down toward the middle of your back. Hold for __________ seconds. Slowly return to the starting position. Repeat __________ times. Complete this exercise __________ times a day. Internal rotation  Place your left / right hand behind your back, palm up. Use your other hand to dangle an exercise band, a towel, or a similar object over your shoulder. Grasp the band with your left / right hand so you are holding on to both ends. Gently pull up on the band until you feel a stretch in the front of your left / right shoulder. The movement of your arm toward the center of your body is called internal rotation. Avoid shrugging your shoulder while you raise your arm. Keep your shoulder blade tucked down toward the middle of your back. Hold for __________ seconds. Release the stretch by letting go of the band and lowering your hands. Repeat __________ times. Complete this exercise __________ times a day. Strengthening exercises External rotation  Sit in a stable chair without armrests. Secure an exercise band to a stable object at elbow height on your left / right side. Place a soft object, such as a folded towel or a small pillow, between your left / right upper arm and your body to move your elbow  about 4 inches (10 cm) away from your side. Hold the end of the exercise band so it is tight and there is no slack. Keeping your elbow pressed against the soft object, slowly move your forearm out, away from your abdomen (external rotation). Keep your body steady so only your forearm moves. Hold for __________ seconds. Slowly return to the starting position. Repeat __________ times. Complete this exercise __________ times a day. Shoulder abduction  Sit in a stable chair without armrests, or stand up. Hold a __________ weight in your left / right hand, or hold an exercise band with both hands. Start with your arms straight down and your left / right palm facing in, toward your body. Slowly lift your left / right hand out to your side (abduction). Do not lift your hand above shoulder height unless your health care provider tells you that this is safe. Keep your arms straight. Avoid shrugging your shoulder while you do this movement. Keep your shoulder blade tucked down toward the middle of your back. Hold for __________ seconds. Slowly lower your arm, and  return to the starting position. Repeat __________ times. Complete this exercise __________ times a day. Shoulder extension Sit in a stable chair without armrests, or stand up. Secure an exercise band to a stable object in front of you so it is at shoulder height. Hold one end of the exercise band in each hand. Your palms should face each other. Straighten your elbows and lift your hands up to shoulder height. Step back, away from the secured end of the exercise band, until the band is tight and there is no slack. Squeeze your shoulder blades together as you pull your hands down to the sides of your thighs (extension). Stop when your hands are straight down by your sides. Do not let your hands go behind your body. Hold for __________ seconds. Slowly return to the starting position. Repeat __________ times. Complete this exercise __________  times a day. Shoulder row Sit in a stable chair without armrests, or stand up. Secure an exercise band to a stable object in front of you so it is at waist height. Hold one end of the exercise band in each hand. Position your palms so that your thumbs are facing the ceiling (neutral position). Bend each of your elbows to a 90-degree angle (right angle) and keep your upper arms at your sides. Step back until the band is tight and there is no slack. Slowly pull your elbows back behind you. Hold for __________ seconds. Slowly return to the starting position. Repeat __________ times. Complete this exercise __________ times a day. Shoulder press-ups  Sit in a stable chair that has armrests. Sit upright, with your feet flat on the floor. Put your hands on the armrests so your elbows are bent and your fingers are pointing forward. Your hands should be about even with the sides of your body. Push down on the armrests and use your arms to lift yourself off the chair. Straighten your elbows and lift yourself up as much as you comfortably can. Move your shoulder blades down, and avoid letting your shoulders move up toward your ears. Keep your feet on the ground. As you get stronger, your feet should support less of your body weight as you lift yourself up. Hold for __________ seconds. Slowly lower yourself back into the chair. Repeat __________ times. Complete this exercise __________ times a day. Wall push-ups  Stand so you are facing a stable wall. Your feet should be about one arm-length away from the wall. Lean forward and place your palms on the wall at shoulder height. Keep your feet flat on the floor as you bend your elbows and lean forward toward the wall. Hold for __________ seconds. Straighten your elbows to push yourself back to the starting position. Repeat __________ times. Complete this exercise __________ times a day. This information is not intended to replace advice given to you by  your health care provider. Make sure you discuss any questions you have with your healthcare provider. Document Revised: 02/27/2019 Document Reviewed: 12/05/2018 Elsevier Patient Education  [place].

## 2021-05-30 NOTE — Progress Notes (Signed)
My left shoulder really hurts.  She fell on 05-21-21 and hurt her left shoulder and her back. She was seen at [facility].  X-rays were done of the back and shoulder.  I have reviewed them and the notes.  I have independently reviewed and interpreted x-rays of this patient done at another site by another physician or qualified health professional.  Her back is much better.  She has pain in the left shoulder and cannot elevate it much.  She has pain sleeping on it.  She has neck pain as well.  She has no numbness.  She has not improved with the pain medicine and the steroids given from the ER.  She feels she is worse.  ROM of the left shoulder is very limited secondary to pain.  Grips and NV are intact.  ROM of neck is full but painful on the left lower neck.  She has no spasm.    X-rays were done of the cervical spine, reported separately.  Encounter Diagnoses  Name Primary?   Neck pain Yes   Acute pain of left shoulder    I am concerned about possible rotator cuff tear on the left.  She may need MRI.  PROCEDURE NOTE:  The patient request injection, verbal consent was obtained.  The left shoulder was prepped appropriately after time out was performed.   Sterile technique was observed and injection of 1 cc of Celestone 6mg  with several cc's of plain xylocaine. Anesthesia was provided by ethyl chloride and a 20-gauge needle was used to inject the shoulder area. A posterior approach was used.  The injection was tolerated well.  A band aid dressing was applied.  The patient was advised to apply ice later today and tomorrow to the injection sight as needed.   I have asked she use Aspercreme, Voltaren Gel or Biofreeze to the shoulder.  Return in two weeks.  Call if any problem.  Precautions discussed.  Electronically Signed Sanjuana Kava, MD 7/12/202210:19 AM

## 2021-06-01 ENCOUNTER — Other Ambulatory Visit: Payer: Self-pay

## 2021-06-01 ENCOUNTER — Encounter (HOSPITAL_COMMUNITY): Payer: Self-pay

## 2021-06-01 ENCOUNTER — Ambulatory Visit (HOSPITAL_COMMUNITY)
Admission: RE | Admit: 2021-06-01 | Discharge: 2021-06-01 | Disposition: A | Discharge status: Home / Self Care | Payer: 59 | Source: Ambulatory Visit | Attending: Physical Medicine and Rehabilitation | Admitting: Physical Medicine and Rehabilitation

## 2021-06-01 DIAGNOSIS — M5417 Radiculopathy, lumbosacral region: Secondary | ICD-10-CM

## 2021-06-01 MED ORDER — LUBIPROSTONE 24 MCG PO CAPS: 24.0000 ug | ORAL_CAPSULE | Freq: Two times a day (BID) | ORAL | 3 refills | Status: DC

## 2021-06-01 NOTE — Telephone Encounter (Signed)
Noted, will begin PA once faxed from the pharmacy.

## 2021-06-01 NOTE — Addendum Note (Signed)
Addended by: Annitta Needs on: 06/01/2021 12:19 PM   Modules accepted: Orders

## 2021-06-01 NOTE — Telephone Encounter (Signed)
Looked on formulary, which can be found here: https://caprx.[employer].com/webSearch/index?[id number]    Linzess is not covered at all.  Amitiza (generic form) is covered but a Tier 4. Symproic and Trulance would need PA.  I am sending in Amitiza 24 mcg po BID (requesting generic). Make sure to take with food twice a day due to side effect of nausea. IF this is not helpful, we could trial symproic but will likely need PA. Amitiza may be pricey due to tier 4, but our options are limited. Stop Linzess when taking Amitiza.

## 2021-06-02 ENCOUNTER — Other Ambulatory Visit: Payer: Self-pay | Admitting: *Deleted

## 2021-06-02 DIAGNOSIS — J302 Other seasonal allergic rhinitis: Secondary | ICD-10-CM

## 2021-06-02 MED ORDER — CETIRIZINE HCL 10 MG PO TABS: 10.0000 mg | ORAL_TABLET | Freq: Every day | ORAL | 0 refills | Status: DC

## 2021-06-09 NOTE — Telephone Encounter (Signed)
Phone the pt to see if she ever went and got her Amitiza from the pharmacy. Was advised by the pt that she is in the process of switching her insurance back to [facility] August 1st. States once she does she will call and let me know. Pt was given 3 boxes of Linzess 145 mcg because we are out of 290 mcg. (She asked for the samples and was ok with the 145 mcg.

## 2021-06-13 ENCOUNTER — Ambulatory Visit: Payer: 59 | Admitting: Orthopaedic Surgery

## 2021-06-13 ENCOUNTER — Other Ambulatory Visit: Payer: Self-pay

## 2021-06-13 ENCOUNTER — Encounter: Payer: Self-pay | Admitting: Orthopaedic Surgery

## 2021-06-13 VITALS — BP 136/90 | HR 83 | Ht 64.0 in | Wt 243.8 lb

## 2021-06-13 DIAGNOSIS — G8929 Other chronic pain: Secondary | ICD-10-CM | POA: Diagnosis not present

## 2021-06-13 DIAGNOSIS — Z6841 Body Mass Index (BMI) 40.0 and over, adult: Secondary | ICD-10-CM

## 2021-06-13 DIAGNOSIS — M25512 Pain in left shoulder: Secondary | ICD-10-CM | POA: Diagnosis not present

## 2021-06-13 MED ORDER — HYDROCODONE-ACETAMINOPHEN 5-325 MG PO TABS: 1.0000 | ORAL_TABLET | ORAL | 0 refills | Status: AC | PRN

## 2021-06-13 NOTE — Progress Notes (Signed)
i

## 2021-06-13 NOTE — Progress Notes (Addendum)
My shoulder is worse.  Her left shoulder has more pain.  She can hardly lift it.  The injection only helped a day or two. She has no numbness.  She has no redness.  Examination of left Upper Extremity is done.  Inspection:   Overall:  Elbow non-tender without crepitus or defects, forearm non-tender without crepitus or defects, wrist non-tender without crepitus or defects, hand non-tender.    Shoulder: with glenohumeral joint tenderness, without effusion.   Upper arm:  without swelling and tenderness   Range of motion:   Overall:  Full range of motion of the elbow, full range of motion of wrist and full range of motion in fingers.   Shoulder:  left  45 degrees forward flexion; 30 degrees abduction; 10 degrees internal rotation, 10 degrees external rotation, 0 degrees extension, 40 degrees adduction.   Stability:   Overall:  Shoulder is weak, probable tear; elbow and wrist stable   Strength and Tone:   Overall full shoulder muscles strength, full upper arm strength and normal upper arm bulk and tone.  Encounter Diagnoses  Name Primary?   Chronic left shoulder pain Yes   Body mass index 40.0-44.9, adult (HCC)    Morbid obesity ([place])       I will get MRI of the left shoulder as she has not improved.  I am concerned about rotator cuff tear.  I have reviewed the [place] web site prior to prescribing narcotic medicine for this patient.  Return in three weeks.  Call if any problem.  Precautions discussed.  Electronically Signed Sanjuana Kava, MD 7/26/20223:40 PM

## 2021-06-20 ENCOUNTER — Telehealth: Payer: Self-pay

## 2021-06-20 NOTE — Telephone Encounter (Signed)
FYI: [name] called: Dr. Luna Glasgow has prescribe her Hydrocodone for a torn rotor cuff.

## 2021-06-21 ENCOUNTER — Telehealth: Payer: Self-pay

## 2021-06-21 NOTE — Telephone Encounter (Signed)
Hydrocodone-Acetaminophen 5/325 MG   PATIENT USES  [facility]

## 2021-06-22 MED ORDER — HYDROCODONE-ACETAMINOPHEN 5-325 MG PO TABS: ORAL_TABLET | ORAL | 0 refills | Status: DC

## 2021-06-24 ENCOUNTER — Other Ambulatory Visit: Payer: Self-pay | Admitting: Family Medicine

## 2021-06-24 DIAGNOSIS — J302 Other seasonal allergic rhinitis: Secondary | ICD-10-CM

## 2021-06-26 ENCOUNTER — Telehealth: Payer: Self-pay | Admitting: Orthopaedic Surgery

## 2021-06-26 NOTE — Telephone Encounter (Signed)
Patient called and left message for Inez Catalina to return her call [phone]

## 2021-06-27 NOTE — Telephone Encounter (Signed)
I called LM for patient to call me back.

## 2021-06-28 NOTE — Telephone Encounter (Signed)
OK- then she will get her meds from him until he's done- then I will restart- thanks, ML

## 2021-07-05 ENCOUNTER — Other Ambulatory Visit: Payer: Self-pay | Admitting: Family Medicine

## 2021-07-05 DIAGNOSIS — J302 Other seasonal allergic rhinitis: Secondary | ICD-10-CM

## 2021-07-06 ENCOUNTER — Telehealth: Payer: Self-pay | Admitting: Orthopaedic Surgery

## 2021-07-06 MED ORDER — HYDROCODONE-ACETAMINOPHEN 5-325 MG PO TABS: ORAL_TABLET | ORAL | 0 refills | Status: DC

## 2021-07-06 NOTE — Telephone Encounter (Signed)
Patient called to request refill and has question about whether there is anything else she can do or the pain / aware of next scheduled appointment: HYDROcodone-acetaminophen (NORCO/VICODIN) 5-325 MG tablet 26 tablet    [place]

## 2021-07-06 NOTE — Telephone Encounter (Signed)
Noted. Patient aware 

## 2021-07-11 ENCOUNTER — Other Ambulatory Visit: Payer: Self-pay

## 2021-07-11 ENCOUNTER — Ambulatory Visit (HOSPITAL_COMMUNITY)
Admission: RE | Admit: 2021-07-11 | Discharge: 2021-07-11 | Disposition: A | Discharge status: Home / Self Care | Payer: 59 | Source: Ambulatory Visit | Attending: Orthopaedic Surgery | Admitting: Orthopaedic Surgery

## 2021-07-11 ENCOUNTER — Telehealth: Payer: Self-pay | Admitting: Internal Medicine

## 2021-07-11 DIAGNOSIS — G8929 Other chronic pain: Secondary | ICD-10-CM | POA: Diagnosis present

## 2021-07-11 DIAGNOSIS — M25512 Pain in left shoulder: Secondary | ICD-10-CM | POA: Diagnosis not present

## 2021-07-11 NOTE — Telephone Encounter (Signed)
Patient has been trying to get linzess and her insurance([employer]) will not cover it.  She has switched to [employer] and would like to try it now.  Please send in a prescription to [facility] in [place]

## 2021-07-12 MED ORDER — LINACLOTIDE 290 MCG PO CAPS: 290.0000 ug | ORAL_CAPSULE | Freq: Every day | ORAL | 3 refills | Status: DC

## 2021-07-12 NOTE — Addendum Note (Signed)
Addended by: Annitta Needs on: 07/12/2021 11:45 AM   Modules accepted: Orders

## 2021-07-12 NOTE — Telephone Encounter (Signed)
Pt has another insurance now and would like for a Rx for Linzess to be sent to [facility] in [place]. The pt has [employer] now.

## 2021-07-12 NOTE — Telephone Encounter (Signed)
Phoned and advised the pt of her Rx being sent in and to wait a little while then call to see if it is ready. Also if it needs a PA I will do it.

## 2021-07-12 NOTE — Telephone Encounter (Signed)
Linzess 290 mcg sent to [facility].

## 2021-07-13 ENCOUNTER — Ambulatory Visit: Payer: 59 | Admitting: Obstetrics and Gynecology

## 2021-07-14 ENCOUNTER — Encounter: Payer: 59 | Attending: Physical Medicine and Rehabilitation | Admitting: Physical Medicine and Rehabilitation

## 2021-07-14 ENCOUNTER — Other Ambulatory Visit: Payer: Self-pay

## 2021-07-14 ENCOUNTER — Other Ambulatory Visit: Payer: Self-pay | Admitting: Family Medicine

## 2021-07-14 ENCOUNTER — Other Ambulatory Visit: Payer: Self-pay | Admitting: *Deleted

## 2021-07-14 ENCOUNTER — Encounter: Payer: Self-pay | Admitting: Physical Medicine and Rehabilitation

## 2021-07-14 VITALS — BP 129/85 | HR 91 | Temp 98.4°F | Ht 64.0 in | Wt 241.6 lb

## 2021-07-14 DIAGNOSIS — M5136 Other intervertebral disc degeneration, lumbar region: Secondary | ICD-10-CM | POA: Diagnosis not present

## 2021-07-14 DIAGNOSIS — M5417 Radiculopathy, lumbosacral region: Secondary | ICD-10-CM | POA: Diagnosis not present

## 2021-07-14 DIAGNOSIS — R110 Nausea: Secondary | ICD-10-CM

## 2021-07-14 DIAGNOSIS — J302 Other seasonal allergic rhinitis: Secondary | ICD-10-CM

## 2021-07-14 MED ORDER — HYDROCODONE-ACETAMINOPHEN 5-325 MG PO TABS: 1.0000 | ORAL_TABLET | Freq: Three times a day (TID) | ORAL | 0 refills | Status: DC | PRN

## 2021-07-14 MED ORDER — DULOXETINE HCL 30 MG PO CPEP: 30.0000 mg | ORAL_CAPSULE | Freq: Every day | ORAL | 3 refills | Status: DC

## 2021-07-14 MED ORDER — ONDANSETRON HCL 4 MG PO TABS: 4.0000 mg | ORAL_TABLET | Freq: Three times a day (TID) | ORAL | 0 refills | Status: DC | PRN

## 2021-07-14 NOTE — Telephone Encounter (Signed)
Hydrocodone order needs to be corrected to read one q 8 hours prn # 90.  I have verified with Dr Dagoberto Ligas.  Zella Ball can you resend a new Rx pt is at pharmacy.

## 2021-07-14 NOTE — Patient Instructions (Signed)
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  Also hx of lumbar surgery- been on Norco for "years".   Here for f/u on chronic pain.  Lyrica- Will decrease 50 mg 2x/day- x  1 week, then stop  -due to side effects- make her hungry! 2.  Duloxetine/Cymbalta- 30 mg nightly-  for nerve/back pain- can cause nausea in 1% of patients- mild constipation/dry eyes/dry mouth. If gets nauseated- CAN TAKE WITH CELEXA DOSE! If this works well, can reduce Celexa and increase Cymbalta at next visit.   3. Will send in Zofran 4 mg 3x/day as needed for nausea to get through if gets sick with Duloxetine.  4. Norco/Hydrocodone- 5/325 3x/day as needed for pain.    5. Home exercise program- 15-20 minutes per day- at least 5 days/week- most patient shave reduction in pain within 3-4 weeks.    6. Chair yoga- youtube has great videos. Add the second week and try to do 2-3x/week.     7. Suggest voltaren gel 4x/day for arthritis in hands.   8. F/U in 3 months- call beforehand if any issues.

## 2021-07-14 NOTE — Progress Notes (Signed)
Subjective:    Patient ID: [name], female    DOB: 1966-03-06, 55 y.o.   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  Also hx of lumbar surgery- been on Norco for "years".   Here for f/u on chronic pain.  Lyrica- makes her feel- "in the clouds"- like drifting in space- still taking it.  Taking 100 mg BID.    Gets nauseated from [place] 1 tab q6 hours prn- taking 4 times per day.       Had a fall 06/20/21- walking in kitchen- shoe hit corner of vent and went sprawling and couldn't move for a few minutes.  Based on MRI of  L shoulder- has a nondisplaced fx of greater tuberosity with edema, and tendinosis of supraspinatus tendon and arthritis of L shoulder.  Pain Inventory Average Pain 8 Pain Right Now 7 My pain is constant, burning, stabbing, tingling, and aching  In the last 24 hours, has pain interfered with the following? General activity 9 Relation with others 4 Enjoyment of life 6 What TIME of day is your pain at its worst? morning , daytime, evening, and night Sleep (in general) Poor  Pain is worse with: walking, sitting, and some activites Pain improves with: rest and medication Relief from Meds:  not taking pain medication  Family History  Problem Relation Age of Onset   Colon cancer Mother        mid-50s, died of metastatic disease   Cancer Mother        liver   Coronary artery disease Mother    Diabetes type I Mother    Peripheral vascular disease Mother        Carotid disease in her 89s   Coronary artery disease Father        CAD in his 90s   Diabetes Father    Diabetes type I Father    Hypertension Father    Bipolar disorder Sister    Coronary artery disease Brother        MI in his 67s   Hypertension Brother    Hypertension Maternal Grandmother    Hyperlipidemia Maternal Grandmother     Stroke Maternal Grandmother    Hypertension Maternal Grandfather    Diabetes Maternal Grandfather    Diabetes Paternal Grandmother    Heart disease Paternal Grandfather    Social History   Socioeconomic History   Marital status: Married    Spouse name: Not on file   Number of children: 3   Years of education: Not on file   Highest education level: Not on file  Occupational History   Occupation: child care    Employer: [employer]  Tobacco Use   Smoking status: Never   Smokeless tobacco: Never  Vaping Use   Vaping Use: Never used  Substance and Sexual Activity   Alcohol use: No    Alcohol/week: 0.0 standard drinks   Drug use: No   Sexual activity: Yes    Partners: Male    Birth control/protection: None  Other Topics Concern   Not on file  Social History Narrative   Not on file   Social Determinants of Health   Financial Resource Strain: Not on file  Food Insecurity: Not on file  Transportation Needs: Not on file  Physical Activity: Not on  file  Stress: Not on file  Social Connections: Not on file   Past Surgical History:  Procedure Laterality Date   back surg x2     BREAST LUMPECTOMY Right 2012   CHOLECYSTECTOMY     COLONOSCOPY  11/03/07   4-mm sessile polyp removed/small internal hemorrhoids/tubular adenoma, random colon bx negative for microscopic colitis   COLONOSCOPY WITH PROPOFOL N/A 09/17/2016   Grade 2 hemorrhoids, colonic diverticulosis, ascending colon polyp (sessile serrated adenoma), 5 year surveillance   COLONOSCOPY, ESOPHAGOGASTRODUODENOSCOPY (EGD) AND ESOPHAGEAL DILATION  01/2012   mild gastritis s/p biopsy. Empiric dilation. Normal duodenum.    DILATATION & CURETTAGE/HYSTEROSCOPY WITH MYOSURE N/A 02/10/2015   Procedure: DILATATION & CURETTAGE/HYSTEROSCOPY WITH MYOSURE;  Surgeon: Nunzio Cobbs, MD;  Location: [facility];  Service: Gynecology;  Laterality: N/A;   ESOPHAGOGASTRODUODENOSCOPY (EGD) WITH PROPOFOL N/A 01/11/2020   Normal  esophagus, s/p dilation, normal stomach, normal duodenum.    MALONEY DILATION N/A 01/11/2020   Procedure: Venia Minks DILATION;  Surgeon: Daneil Dolin, MD;  [facility];  Service: Endoscopy;  Laterality: N/A;   MM BREAST STEREO BX*L*R/S     rt.   POLYPECTOMY  09/17/2016   Procedure: POLYPECTOMY;  Surgeon: Daneil Dolin, MD;  [facility];  Service: Endoscopy;;  ascending colon   PORT-A-CATH REMOVAL  05/26/2012   Procedure: REMOVAL PORT-A-CATH;  Surgeon: Jamesetta So, MD;  Location: [facility];  Service: General;  Laterality: N/A;  Minor Room   PORTACATH PLACEMENT     Past Surgical History:  Procedure Laterality Date   back surg x2     BREAST LUMPECTOMY Right 2012   CHOLECYSTECTOMY     COLONOSCOPY  11/03/07   4-mm sessile polyp removed/small internal hemorrhoids/tubular adenoma, random colon bx negative for microscopic colitis   COLONOSCOPY WITH PROPOFOL N/A 09/17/2016   Grade 2 hemorrhoids, colonic diverticulosis, ascending colon polyp (sessile serrated adenoma), 5 year surveillance   COLONOSCOPY, ESOPHAGOGASTRODUODENOSCOPY (EGD) AND ESOPHAGEAL DILATION  01/2012   mild gastritis s/p biopsy. Empiric dilation. Normal duodenum.    DILATATION & CURETTAGE/HYSTEROSCOPY WITH MYOSURE N/A 02/10/2015   Procedure: DILATATION & CURETTAGE/HYSTEROSCOPY WITH MYOSURE;  Surgeon: Nunzio Cobbs, MD;  Location: [facility];  Service: Gynecology;  Laterality: N/A;   ESOPHAGOGASTRODUODENOSCOPY (EGD) WITH PROPOFOL N/A 01/11/2020   Normal esophagus, s/p dilation, normal stomach, normal duodenum.    MALONEY DILATION N/A 01/11/2020   Procedure: Venia Minks DILATION;  Surgeon: Daneil Dolin, MD;  [facility];  Service: Endoscopy;  Laterality: N/A;   MM BREAST STEREO BX*L*R/S     rt.   POLYPECTOMY  09/17/2016   Procedure: POLYPECTOMY;  Surgeon: Daneil Dolin, MD;  [facility];  Service: Endoscopy;;  ascending colon   PORT-A-CATH REMOVAL  05/26/2012   Procedure: REMOVAL  PORT-A-CATH;  Surgeon: Jamesetta So, MD;  Location: [facility];  Service: General;  Laterality: N/A;  Minor Room   PORTACATH PLACEMENT     Past Medical History:  Diagnosis Date   Anxiety    Blood transfusion without reported diagnosis 1999   due to heavy menses   Breast cancer ([place]) 05/22/2011   03/01/11, Stage 2, s/p lumpectomy, chemo/xrt   DDD (degenerative disc disease), lumbar    Depression 06/22/2011   Diverticulitis    DM (diabetes mellitus) ([place]) 06/22/2011   Genital warts    GERD (gastroesophageal reflux disease) 06/22/2011   Hx of adenomatous colonic polyps 10/2007   3m sigmoid tubular adenoma, FH colon cancer,  mother in mid-50s   Hypercholesterolemia 06/22/2011   Hypertension 06/22/2011   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[place]) 05/22/2011   Iron deficiency anemia 03/25/2015   Morbid obesity ([facility])    Shortness of breath    STD (sexually transmitted disease)    HPV, 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   Vaginal bleeding 03/08/2014   BP 129/85   Pulse 91   Temp 98.4 F (36.9 C)   Ht '5\' 4"'$  (1.626 m)   Wt 241 lb 9.6 oz (109.6 kg)   LMP 04/05/2016 Comment: spotting 11/2016 and 12/2016  SpO2 96%   BMI 41.47 kg/m   Opioid Risk Score:   Fall Risk Score:  `1  Depression screen PHQ 2/9  Depression screen [facility] 2/9 05/05/2021 04/14/2021 10/04/2020  Decreased Interest '3 2 1  '$ Down, Depressed, Hopeless '3 2 1  '$ PHQ - 2 Score '6 4 2  '$ Altered sleeping '1 2 1  '$ Tired, decreased energy '3 2 2  '$ Change in appetite 1 1 0  Feeling bad or failure about yourself  '1 1 1  '$ Trouble concentrating '2 3 1  '$ Moving slowly or fidgety/restless '1 3 2  '$ Suicidal thoughts 1 0 0  PHQ-9 Score '16 16 9  '$ Difficult doing work/chores - Extremely dIfficult Somewhat difficult  Some recent data might be hidden    Review of Systems  Musculoskeletal:  Positive for back pain and gait problem.       Pain in arms, legs, right shoulder  Neurological:  Positive for weakness.  All other systems reviewed and are negative.      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  Also hx of lumbar surgery- been on Norco for "years".   Here for f/u on chronic pain.  Lyrica- Will decrease 50 mg 2x/day- x  1 week, then stop  -due to side effects- make her hungry! 2.  Duloxetine/Cymbalta- 30 mg nightly-  for nerve/back pain- can cause nausea in 1% of patients- mild constipation/dry eyes/dry mouth. If gets nauseated- CAN TAKE WITH CELEXA DOSE! If this works well, can reduce Celexa and increase Cymbalta at next visit.   3. Will send in Zofran 4 mg 3x/day as needed for nausea to get through if gets sick with Duloxetine.  4. Norco/Hydrocodone- 5/325 3x/day as needed for pain.    5. Home exercise program- 15-20 minutes per day- at least 5 days/week- most patient shave reduction in pain within 3-4 weeks.    6. Chair yoga- youtube has great videos. Add the second week and try to do 2-3x/week.     7. Suggest voltaren gel 4x/day for arthritis in hands.   8. F/U in 3 months- call beforehand if any issues.     I spent a total 38 minutes on visit- discussing HEP, pain control, arthritis of back and nerve pain.

## 2021-07-14 NOTE — Telephone Encounter (Signed)
PMP was Reviewed. Dr Dagoberto Ligas Note was Reviewed.  Hydrocodone e-scribed.

## 2021-07-15 ENCOUNTER — Other Ambulatory Visit: Payer: Self-pay | Admitting: Family Medicine

## 2021-07-15 DIAGNOSIS — J302 Other seasonal allergic rhinitis: Secondary | ICD-10-CM

## 2021-07-25 ENCOUNTER — Ambulatory Visit (INDEPENDENT_AMBULATORY_CARE_PROVIDER_SITE_OTHER): Payer: 59 | Admitting: Orthopaedic Surgery

## 2021-07-25 ENCOUNTER — Encounter: Payer: Self-pay | Admitting: Orthopaedic Surgery

## 2021-07-25 ENCOUNTER — Other Ambulatory Visit: Payer: Self-pay

## 2021-07-25 DIAGNOSIS — S42255A Nondisplaced fracture of greater tuberosity of left humerus, initial encounter for closed fracture: Secondary | ICD-10-CM

## 2021-07-25 NOTE — Progress Notes (Signed)
My shoulder still hurts.  MRI of the left shoulder shows: IMPRESSION: 1. Nondisplaced impaction fracture of the greater tuberosity with surrounding marrow edema. 2. Moderate tendinosis of the supraspinatus tendon. 3. Mild tendinosis of the intra-articular portion of the long head of the biceps tendon. 4. Mild partial-thickness cartilage loss of the glenohumeral joint.  I have explained the findings to her.  She has a fracture not seen on the plain xray of the left shoulder greater tuberosity.  She has been using the arm somewhat.  I told her I do not recommend a sling now but she could have one if she wanted.  She declines.  I have gone over precautions.  The swelling medially is from the fracture.  She is to limit motion of the shoulder and consider strongly of sleeping in a lazy boy type chair position at night.  Encounter Diagnosis  Name Primary?   Closed nondisplaced fracture of greater tuberosity of left humerus, initial encounter Yes   Return in three weeks.  X-rays of the shoulder then.  Call her pain management doctor to let them now of the fracture.  Call if any problem.  Precautions discussed.  Electronically Signed Sanjuana Kava, MD 9/6/20224:13 PM

## 2021-07-27 ENCOUNTER — Telehealth: Payer: Self-pay

## 2021-07-27 NOTE — Telephone Encounter (Signed)
Pt [name] called and said she saw Dr.Keeling Patrick [place] and told her she had a fracture and she might need more pain medication . Pt will notify us

## 2021-07-28 NOTE — Telephone Encounter (Signed)
As above.

## 2021-07-31 ENCOUNTER — Telehealth: Payer: Self-pay

## 2021-07-31 ENCOUNTER — Encounter: Payer: Self-pay | Admitting: *Deleted

## 2021-07-31 NOTE — Telephone Encounter (Signed)
[name] has a shoulder fracture. The Ortho MD advised her to call you for pain management. She is having to take her Hydrocodone 5-325 MG four times daily for pain relief. But is not getting much relief.  What more can she do?   Call back phone [phone].

## 2021-08-06 ENCOUNTER — Other Ambulatory Visit: Payer: Self-pay | Admitting: Family Medicine

## 2021-08-07 NOTE — Progress Notes (Deleted)
55 y.o. [id number] Married Caucasian female here for annual exam.    PCP:     Patient's last menstrual period was 04/05/2016.           Sexually active: {yes no:314532}  The current method of family planning is {contraception:315051}.    Exercising: {yes no:314532}  {types:19826} Smoker:  no  Health Maintenance: Pap:  01-31-18 normal History of abnormal Pap:  yes 2015 ASCUS Pos HR HPV--Colpo LGSIL MMG:  04-27-21 normal Colonoscopy:  2013 normal BMD:   N/A  Result   TDaP:  2016 Gardasil:   no HIV:2018 NR Hep C:2018 NR Screening Labs:  Hb today: PCP, Urine today: ***   reports that she has never smoked. She has never used smokeless tobacco. She reports that she does not drink alcohol and does not use drugs.  Past Medical History:  Diagnosis Date   Anxiety    Blood transfusion without reported diagnosis 1999   due to heavy menses   Breast cancer ([place]) 05/22/2011   03/01/11, Stage 2, s/p lumpectomy, chemo/xrt   DDD (degenerative disc disease), lumbar    Depression 06/22/2011   Diverticulitis    DM (diabetes mellitus) ([place]) 06/22/2011   Genital warts    GERD (gastroesophageal reflux disease) 06/22/2011   Hx of adenomatous colonic polyps 10/2007   60m sigmoid tubular adenoma, FH colon cancer, mother in mid-50s   Hypercholesterolemia 06/22/2011   Hypertension 06/22/2011   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 05/22/2011   Iron deficiency anemia 03/25/2015   [place]    Shortness of breath    STD (sexually transmitted disease)    HPV, 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 Empiric dilation. Normal duodenum.    DILATATION & CURETTAGE/HYSTEROSCOPY WITH MYOSURE N/A 02/10/2015   Procedure: DILATATION & CURETTAGE/HYSTEROSCOPY WITH MYOSURE;  Surgeon: BNunzio Cobbs MD;  [facility];  Service: Gynecology;  Laterality: N/A;   ESOPHAGOGASTRODUODENOSCOPY (EGD) WITH PROPOFOL N/A 01/11/2020   Normal esophagus, s/p dilation, normal stomach, normal duodenum.    MALONEY DILATION N/A 01/11/2020   Procedure: MVenia MinksDILATION;  Surgeon: RDaneil Dolin MD;  [facility];  Service: Endoscopy;  Laterality: N/A;   MM BREAST STEREO BX*L*R/S     rt.   POLYPECTOMY  09/17/2016   Procedure: POLYPECTOMY;  Surgeon: RDaneil Dolin MD;  [facility];  Service: Endoscopy;;  ascending colon   PORT-A-CATH REMOVAL  05/26/2012   Procedure: REMOVAL PORT-A-CATH;  Surgeon: MJamesetta So MD;  [facility];  Service: General;  Laterality: N/A;  Minor Room   PORTACATH PLACEMENT      Current Outpatient Medications  Medication Sig Dispense Refill   atorvastatin (LIPITOR) 40 MG tablet TAKE 1 TABLET BY MOUTH AT BEDTIME 90 tablet 0   blood glucose meter kit and supplies Dispense based on patient and insurance preference. Use up to four times daily as directed. (FOR ICD-10 E10.9, E11.9). 1 each 5  cetirizine (ZYRTEC) 10 MG tablet Take 1 tablet by mouth once daily 30 tablet 3   citalopram (CELEXA) 40 MG tablet Take 1 tablet (40 mg total) by mouth daily. 90 tablet 1   Continuous Blood Gluc Receiver (FREESTYLE LIBRE 14 DAY READER) DEVI Use as directed. 1 each 0   Continuous Blood Gluc Sensor (FREESTYLE LIBRE 14 DAY SENSOR) MISC Use as directed 1 each 3   Dulaglutide (TRULICITY) 9.16 BW/4.6KZ SOPN INJECT 0.75 MG SUBCUTANEOUSLY ONCE WEEKLY 4 mL 0   DULoxetine (CYMBALTA) 30 MG capsule Take 1 capsule (30 mg total)  by mouth at bedtime. For nerve pain- can take Celexa 40 mg daily- but cannot increase Duloxetine on current Celexa dose 30 capsule 3   ergocalciferol (VITAMIN D2) 1.25 MG (50000 UT) capsule Take 1 capsule (50,000 Units total) by mouth once a week. 4 capsule 12   fluticasone (FLONASE) 50 MCG/ACT nasal spray Use 2 spray(s) in each nostril once daily 16 g 0   HYDROcodone-acetaminophen (NORCO/VICODIN) 5-325 MG tablet Take 1 tablet by mouth every 8 (eight) hours as needed for severe pain. 90 tablet 0   linaclotide (LINZESS) 290 MCG CAPS capsule Take 1 capsule (290 mcg total) by mouth daily before breakfast. 90 capsule 3   lisinopril (ZESTRIL) 10 MG tablet Take one tablet po once daily 90 tablet 0   metFORMIN (GLUCOPHAGE-XR) 750 MG 24 hr tablet Take one tablet po once daily with breakfast 90 tablet 0   ondansetron (ZOFRAN) 4 MG tablet Take 1 tablet (4 mg total) by mouth every 8 (eight) hours as needed for nausea or vomiting. 45 tablet 0   ondansetron (ZOFRAN-ODT) 4 MG disintegrating tablet Take 1 tablet (4 mg total) by mouth 3 (three) times daily before meals. As needed for nausea 90 tablet 3   pantoprazole (PROTONIX) 40 MG tablet Take 1 tablet (40 mg total) by mouth 2 (two) times daily before a meal. 180 tablet 3   No current facility-administered medications for this visit.    Family History  Problem Relation Age of Onset   Colon cancer Mother        mid-50s, died of metastatic disease   Cancer Mother        liver   Coronary artery disease Mother    Diabetes type I Mother    Peripheral vascular disease Mother        Carotid disease in her 23s   Coronary artery disease Father        CAD in his 65s   Diabetes Father    Diabetes type I Father    Hypertension Father    Bipolar disorder Sister    Coronary artery disease Brother        MI in his 55s   Hypertension Brother    Hypertension Maternal Grandmother    Hyperlipidemia Maternal Grandmother    Stroke Maternal Grandmother     Hypertension Maternal Grandfather    Diabetes Maternal Grandfather    Diabetes Paternal Grandmother    Heart disease Paternal Grandfather     Review of Systems  Exam:   LMP 04/05/2016 Comment: spotting 11/2016 and 12/2016    General appearance: alert, cooperative and appears stated age Head: normocephalic, without obvious abnormality, atraumatic Neck: no adenopathy, supple, symmetrical, trachea midline and thyroid normal to inspection and palpation Lungs: clear to auscultation bilaterally Breasts: normal appearance, no masses or tenderness, No nipple retraction or dimpling, No nipple discharge or bleeding, No axillary adenopathy Heart: regular rate and rhythm Abdomen: soft, non-tender; no  masses, no organomegaly Extremities: extremities normal, atraumatic, no cyanosis or edema Skin: skin color, texture, turgor normal. No rashes or lesions Lymph nodes: cervical, supraclavicular, and axillary nodes normal. Neurologic: grossly normal  Pelvic: External genitalia:  no lesions              No abnormal inguinal nodes palpated.              Urethra:  normal appearing urethra with no masses, tenderness or lesions              Bartholins and Skenes: normal                 Vagina: normal appearing vagina with normal color and discharge, no lesions              Cervix: no lesions              Pap taken: {yes no:314532} Bimanual Exam:  Uterus:  normal size, contour, position, consistency, mobility, non-tender              Adnexa: no mass, fullness, tenderness              Rectal exam: {yes no:314532}.  Confirms.              Anus:  normal sphincter tone, no lesions  Chaperone was present for exam:  ***  Assessment:   Well woman visit with gynecologic exam.   Plan: Mammogram screening discussed. Self breast awareness reviewed. Pap and HR HPV as above. Guidelines for Calcium, Vitamin D, regular exercise program including cardiovascular and weight bearing exercise.   Follow up annually  and prn.   Additional counseling given.  {yes [id number]. _______ minutes face to face time of which over 50% was spent in counseling.    After visit summary provided.

## 2021-08-09 ENCOUNTER — Other Ambulatory Visit: Payer: Self-pay

## 2021-08-09 ENCOUNTER — Encounter: Payer: Self-pay | Admitting: Gastroenterology

## 2021-08-09 ENCOUNTER — Ambulatory Visit (INDEPENDENT_AMBULATORY_CARE_PROVIDER_SITE_OTHER): Payer: 59 | Admitting: Gastroenterology

## 2021-08-09 VITALS — BP 142/90 | HR 99 | Temp 97.3°F | Ht 64.0 in | Wt 238.6 lb

## 2021-08-09 DIAGNOSIS — R1319 Other dysphagia: Secondary | ICD-10-CM | POA: Diagnosis not present

## 2021-08-09 DIAGNOSIS — R110 Nausea: Secondary | ICD-10-CM

## 2021-08-09 DIAGNOSIS — R1013 Epigastric pain: Secondary | ICD-10-CM

## 2021-08-09 DIAGNOSIS — K5900 Constipation, unspecified: Secondary | ICD-10-CM | POA: Diagnosis not present

## 2021-08-09 DIAGNOSIS — K219 Gastro-esophageal reflux disease without esophagitis: Secondary | ICD-10-CM

## 2021-08-09 DIAGNOSIS — Z86010 Personal history of colonic polyps: Secondary | ICD-10-CM

## 2021-08-09 MED ORDER — HYDROCORTISONE (PERIANAL) 2.5 % EX CREA: 1.0000 "application " | TOPICAL_CREAM | Freq: Two times a day (BID) | CUTANEOUS | 1 refills | Status: DC

## 2021-08-09 NOTE — Progress Notes (Signed)
Referring Provider: Erven Colla, DO Primary Care Physician:  Erven Colla, DO Primary GI: Dr. Abbey Chatters  Chief Complaint  Patient presents with   Nausea    No vomiting   Colonoscopy    Due for tcs     HPI:   [name] is a 55 y.o. female presenting today with a history of abdominal pain, gastritis, undergoing EGD in Feb 2021 with normal esophagus s/p dilation, normal stomach, and normal duodenum. Surveillance colonoscopy due in Oct 2022 with history of adenomas and family history of colon cancer in mother in her mid 65s, succumbing to the disease.   At last visit in May 2022 was dealing with nausea following acute gastroenteritis presentation. GES was normal.   Zofran TID before meals. Has breakthrough nausea. Protonix BID. Had some liquid dysphagia.No solid food dysphagia. Notes epigastric pain. No NSAIDs, as they burn her stomach.   Linzess 290 mcg daily for constipation. Scant blood if straining. Sometimes rectal itching.   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   Iron deficiency anemia 03/25/2015   Morbid obesity ([place])    Shortness of breath    STD (sexually transmitted disease)    HPV, 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 Empiric dilation. Normal duodenum.    DILATATION & CURETTAGE/HYSTEROSCOPY WITH MYOSURE N/A 02/10/2015   Procedure: DILATATION & CURETTAGE/HYSTEROSCOPY WITH MYOSURE;  Surgeon: Nunzio Cobbs, MD;  Location: [facility];  Service: Gynecology;  Laterality: N/A;   ESOPHAGOGASTRODUODENOSCOPY (EGD) WITH PROPOFOL N/A 01/11/2020   Normal esophagus, s/p dilation, normal stomach, normal duodenum.    MALONEY DILATION N/A 01/11/2020   Procedure: Venia Minks DILATION;  Surgeon: Daneil Dolin, MD;  [facility];  Service: Endoscopy;  Laterality: N/A;   MM BREAST STEREO BX*L*R/S     rt.   POLYPECTOMY  09/17/2016   Procedure: POLYPECTOMY;  Surgeon: Daneil Dolin, MD;  [facility]: [facility];  Service: Endoscopy;;  ascending colon   PORT-A-CATH REMOVAL  05/26/2012   Procedure: REMOVAL PORT-A-CATH;  Surgeon: Jamesetta So, MD;  Location: [facility];  Service: General;  Laterality: N/A;  Minor Room   PORTACATH PLACEMENT      Current Outpatient Medications  Medication Sig Dispense Refill   atorvastatin (LIPITOR) 40 MG tablet TAKE 1 TABLET BY MOUTH AT BEDTIME 45  tablet 0   blood glucose meter kit and supplies Dispense based on patient and insurance preference. Use up to four times daily as directed. (FOR ICD-10 E10.9, E11.9). 1 each 5   cetirizine (ZYRTEC) 10 MG tablet Take 1 tablet by mouth once daily 30 tablet 3   citalopram (CELEXA) 40 MG tablet Take 1 tablet (40 mg total) by mouth daily. 90 tablet 1   Continuous Blood Gluc Receiver (FREESTYLE LIBRE 14 DAY READER) DEVI Use as directed. 1 each  0   Continuous Blood Gluc Sensor (FREESTYLE LIBRE 14 DAY SENSOR) MISC Use as directed 1 each 3   Dulaglutide (TRULICITY) 1.61 WR/6.0AV SOPN INJECT 0.75 MG SUBCUTANEOUSLY ONCE WEEKLY 4 mL 0   DULoxetine (CYMBALTA) 30 MG capsule Take 1 capsule (30 mg total) by mouth at bedtime. For nerve pain- can take Celexa 40 mg daily- but cannot increase Duloxetine on current Celexa dose 30 capsule 3   ergocalciferol (VITAMIN D2) 1.25 MG (50000 UT) capsule Take 1 capsule (50,000 Units total) by mouth once a week. 4 capsule 12   fluticasone (FLONASE) 50 MCG/ACT nasal spray Use 2 spray(s) in each nostril once daily 16 g 0   HYDROcodone-acetaminophen (NORCO/VICODIN) 5-325 MG tablet Take 1 tablet by mouth every 8 (eight) hours as needed for severe pain. 90 tablet 0   linaclotide (LINZESS) 290 MCG CAPS capsule Take 1 capsule (290 mcg total) by mouth daily before breakfast. 90 capsule 3   lisinopril (ZESTRIL) 10 MG tablet Take 1 tablet by mouth once daily 45 tablet 0   metFORMIN (GLUCOPHAGE-XR) 750 MG 24 hr tablet Take 1 tablet by mouth once daily with breakfast 45 tablet 0   ondansetron (ZOFRAN) 4 MG tablet Take 1 tablet (4 mg total) by mouth every 8 (eight) hours as needed for nausea or vomiting. 45 tablet 0   ondansetron (ZOFRAN-ODT) 4 MG disintegrating tablet Take 1 tablet (4 mg total) by mouth 3 (three) times daily before meals. As needed for nausea 90 tablet 3   pantoprazole (PROTONIX) 40 MG tablet Take 1 tablet (40 mg total) by mouth 2 (two) times daily before a meal. 180 tablet 3   No current facility-administered medications for this visit.    Allergies as of 08/09/2021 - Review Complete 08/09/2021  Allergen Reaction Noted   Flagyl [metronidazole] Hives, Itching, and Swelling 10/15/2017   Lansoprazole Other (See Comments) 12/26/2017   Pravastatin Other (See Comments) 08/02/2015    Family History  Problem Relation Age of Onset   Colon cancer Mother        mid-50s, died of metastatic disease   Cancer  Mother        liver   Coronary artery disease Mother    Diabetes type I Mother    Peripheral vascular disease Mother        Carotid disease in her 62s   Coronary artery disease Father        CAD in his 23s   Diabetes Father    Diabetes type I Father    Hypertension Father    Bipolar disorder Sister    Coronary artery disease Brother        MI in his 68s   Hypertension Brother    Hypertension Maternal Grandmother    Hyperlipidemia Maternal Grandmother    Stroke Maternal Grandmother    Hypertension Maternal Grandfather    Diabetes Maternal Grandfather    Diabetes Paternal Grandmother    Heart disease Paternal Grandfather     Social History   Socioeconomic  History   Marital status: Married    Spouse name: Not on file   Number of children: 3   Years of education: Not on file   Highest education level: Not on file  Occupational History   Occupation: child care    Employer: [employer]  Tobacco Use   Smoking status: Never   Smokeless tobacco: Never  Vaping Use   Vaping Use: Never used  Substance and Sexual Activity   Alcohol use: No    Alcohol/week: 0.0 standard drinks   Drug use: No   Sexual activity: Yes    Partners: Male    Birth control/protection: None  Other Topics Concern   Not on file  Social History Narrative   Not on file   Social Determinants of Health   Financial Resource Strain: Not on file  Food Insecurity: Not on file  Transportation Needs: Not on file  Physical Activity: Not on file  Stress: Not on file  Social Connections: Not on file    Review of Systems: Gen: Denies fever, chills, anorexia. Denies fatigue, weakness, weight loss.  CV: Denies chest pain, palpitations, syncope, peripheral edema, and claudication. Resp: Denies dyspnea at rest, cough, wheezing, coughing up blood, and pleurisy. GI: see HPI Derm: Denies rash, itching, dry skin Psych: Denies depression, anxiety, memory loss, confusion. No homicidal or suicidal ideation.   Heme: Denies bruising, bleeding, and enlarged lymph nodes.  Physical Exam: BP (!) 142/90   Pulse 99   Temp (!) 97.3 F (36.3 C) (Temporal)   Ht $R'5\' 4"'Up$  (1.626 m)   Wt 238 lb 9.6 oz (108.2 kg)   LMP 04/05/2016 Comment: spotting 11/2016 and 12/2016  BMI 40.96 kg/m  General:   Alert and oriented. No distress noted. Pleasant and cooperative.  Head:  Normocephalic and atraumatic. Eyes:  Conjuctiva clear without scleral icterus. Mouth:  mask in place Abdomen:  +BS, soft, mild TTP epigastric and non-distended. No rebound or guarding. No HSM or masses noted. Rectal: no fissure. Small external hemorrhoid tags. No mass on DRE. No overt blood.  Msk:  Symmetrical without gross deformities. Normal posture. Extremities:  Without edema. Neurologic:  Alert and  oriented x4 Psych:  Alert and cooperative. Normal mood and affect.  ASSESSMENT: [name] is a 55 y.o. female presenting today with a history of abdominal pain, gastritis, undergoing EGD in Feb 2021 with normal esophagus s/p dilation, normal stomach, and normal duodenum. Surveillance colonoscopy due in Oct 2022 with history of adenomas and family history of colon cancer in mother in her mid 78s, succumbing to the disease.   Persistent nausea noted since May 2022, following acute gastroenteritis presentation. GES normal. Continues with nausea despite supportive measures and now with dyspepsia. Avoiding NSAIDs. Will pursue EGD in near future. Gallbladder absent.   Linzess 290 mcg daily working well for constipation. Scant blood with straining and rectal itching. May need banding in future. Due for colonoscopy now as history of adenomas and family history of colon cancer in mother in mid 15s.   PLAN:  Proceed with colonoscopy/EGD by Dr. Abbey Chatters  in near future: the risks, benefits, and alternatives have been discussed with the patient in detail. The patient states understanding and desires to proceed.  Continue PPI BID Continue scheduled  Zofran Anusol cream sent to pharmacy Continue Linzess 290 mcg daily Return in follow-up after procedure. May need banding   Annitta Needs, PhD, ANP-BC [facility]

## 2021-08-09 NOTE — Patient Instructions (Signed)
We are arranging a colonoscopy, upper endoscopy, and possible dilation in the near future. Do not take metformin the day of the procedure.   I have sent in Anusol cream you can take twice a day per rectum for 7 days. You can repeat as needed.   Further recommendations to follow after the procedure!  I enjoyed seeing you again today! As you know, I value our relationship and want to provide genuine, compassionate, and quality care. I welcome your feedback. If you receive a survey regarding your visit,  I greatly appreciate you taking time to fill this out. See you next time!  Annitta Needs, PhD, ANP-BC [facility]

## 2021-08-10 ENCOUNTER — Other Ambulatory Visit: Payer: Self-pay | Admitting: Family Medicine

## 2021-08-10 DIAGNOSIS — J302 Other seasonal allergic rhinitis: Secondary | ICD-10-CM

## 2021-08-11 ENCOUNTER — Emergency Department (HOSPITAL_COMMUNITY)
Admission: EM | Admit: 2021-08-11 | Discharge: 2021-08-11 | Disposition: A | Discharge status: Home / Self Care | Payer: 59 | Attending: Emergency Medicine | Admitting: Emergency Medicine

## 2021-08-11 ENCOUNTER — Ambulatory Visit: Payer: 59 | Admitting: Obstetrics and Gynecology

## 2021-08-11 ENCOUNTER — Other Ambulatory Visit: Payer: Self-pay

## 2021-08-11 ENCOUNTER — Emergency Department (HOSPITAL_COMMUNITY): Payer: 59

## 2021-08-11 ENCOUNTER — Encounter (HOSPITAL_COMMUNITY): Payer: Self-pay

## 2021-08-11 DIAGNOSIS — E119 Type 2 diabetes mellitus without complications: Secondary | ICD-10-CM | POA: Diagnosis not present

## 2021-08-11 DIAGNOSIS — I10 Essential (primary) hypertension: Secondary | ICD-10-CM | POA: Insufficient documentation

## 2021-08-11 DIAGNOSIS — M542 Cervicalgia: Secondary | ICD-10-CM | POA: Diagnosis not present

## 2021-08-11 DIAGNOSIS — Z7984 Long term (current) use of oral hypoglycemic drugs: Secondary | ICD-10-CM | POA: Diagnosis not present

## 2021-08-11 DIAGNOSIS — M25512 Pain in left shoulder: Secondary | ICD-10-CM | POA: Insufficient documentation

## 2021-08-11 DIAGNOSIS — Z853 Personal history of malignant neoplasm of breast: Secondary | ICD-10-CM | POA: Diagnosis not present

## 2021-08-11 DIAGNOSIS — Z79899 Other long term (current) drug therapy: Secondary | ICD-10-CM | POA: Diagnosis not present

## 2021-08-11 MED ORDER — HYDROMORPHONE HCL 1 MG/ML IJ SOLN
1.0000 mg | Freq: Once | INTRAMUSCULAR | Status: AC
  Administered 2021-08-11: 1 mg via INTRAMUSCULAR
  Filled 2021-08-11: qty 1
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
## 2021-08-11 NOTE — Discharge Instructions (Signed)
Call your pain management office to schedule appointment

## 2021-08-12 ENCOUNTER — Other Ambulatory Visit: Payer: Self-pay | Admitting: Family Medicine

## 2021-08-12 DIAGNOSIS — E1165 Type 2 diabetes mellitus with hyperglycemia: Secondary | ICD-10-CM

## 2021-08-15 ENCOUNTER — Ambulatory Visit (INDEPENDENT_AMBULATORY_CARE_PROVIDER_SITE_OTHER): Payer: 59 | Admitting: Orthopaedic Surgery

## 2021-08-15 ENCOUNTER — Encounter: Payer: Self-pay | Admitting: Orthopaedic Surgery

## 2021-08-15 ENCOUNTER — Other Ambulatory Visit: Payer: Self-pay

## 2021-08-15 VITALS — BP 142/87 | HR 107 | Ht 64.0 in | Wt 240.0 lb

## 2021-08-15 DIAGNOSIS — G8929 Other chronic pain: Secondary | ICD-10-CM

## 2021-08-15 DIAGNOSIS — S42255A Nondisplaced fracture of greater tuberosity of left humerus, initial encounter for closed fracture: Secondary | ICD-10-CM

## 2021-08-15 DIAGNOSIS — Z6841 Body Mass Index (BMI) 40.0 and over, adult: Secondary | ICD-10-CM

## 2021-08-15 MED ORDER — HYDROCODONE-ACETAMINOPHEN 5-325 MG PO TABS: 1.0000 | ORAL_TABLET | Freq: Three times a day (TID) | ORAL | 0 refills | Status: DC | PRN
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
## 2021-08-22 NOTE — Patient Instructions (Signed)
[name]  08/22/2021     @PREFPERIOPPHARMACY @   Your procedure is scheduled on 08/25/2021.   Report to [name] at [phone]  A.M.   Call this number if you have problems the morning of surgery:  [phone]   Remember:  Follow the diet and prep instructions given to you by the office.     DO NOT take any medications for diabetes the morning of your procedure.    Take these medicines the morning of surgery with A SIP OF WATER              celexa, hydrocodone(if needed), zofran (if needed), protonix.    Do not wear jewelry, make-up or nail polish.  Do not wear lotions, powders, or perfumes, or deodorant.  Do not shave 48 hours prior to surgery.  Men may shave face and neck.  Do not bring valuables to the hospital.  [facility] is not responsible for any belongings or valuables.  Contacts, dentures or bridgework may not be worn into surgery.  Leave your suitcase in the car.  After surgery it may be brought to your room.  For patients admitted to the hospital, discharge time will be determined by your treatment team.  Patients discharged the day of surgery will not be allowed to drive home and must have someone with them for 24 hours.    Special instructions:   DO NOT smoke tobacco or vape for 24 hours before your procedure.  Please read over the following fact sheets that you were given. Anesthesia Post-op Instructions and Care and Recovery After Surgery      Upper Endoscopy, Adult, Care After This sheet gives you information about how to care for yourself after your procedure. Your health care provider may also give you more specific instructions. If you have problems or questions, contact your health care provider. What can I expect after the procedure? After the procedure, it is common to have: A sore throat. Mild stomach pain or discomfort. Bloating. Nausea. Follow these instructions at home:  Follow instructions from your health care  provider about what to eat or drink after your procedure. Return to your normal activities as told by your health care provider. Ask your health care provider what activities are safe for you. Take over-the-counter and prescription medicines only as told by your health care provider. If you were given a sedative during the procedure, it can affect you for several hours. Do not drive or operate machinery until your health care provider says that it is safe. Keep all follow-up visits as told by your health care provider. This is important. Contact a health care provider if you have: A sore throat that lasts longer than one day. Trouble swallowing. Get help right away if: You vomit blood or your vomit looks like coffee grounds. You have: A fever. Bloody, black, or tarry stools. A severe sore throat or you cannot swallow. Difficulty breathing. Severe pain in your chest or abdomen. Summary After the procedure, it is common to have a sore throat, mild stomach discomfort, bloating, and nausea. If you were given a sedative during the procedure, it can affect you for several hours. Do not drive or operate machinery until your health care provider says that it is safe. Follow instructions from your health care provider about what to eat or drink after your procedure. Return to your normal activities as told by your health care provider. This information is not  intended to replace advice given to you by your health care provider. Make sure you discuss any questions you have with your health care provider. Document Revised: 11/03/2019 Document Reviewed: 04/07/2018 Elsevier Patient Education  2022 [place]. Esophageal Dilatation Esophageal dilatation, also called esophageal dilation, is a procedure to widen or open a blocked or narrowed part of the esophagus. The esophagus is the part of the body that moves food and liquid from the mouth to the stomach. You may need this procedure if: You have a  buildup of scar tissue in your esophagus that makes it difficult, painful, or impossible to swallow. This can be caused by gastroesophageal reflux disease (GERD). You have cancer of the esophagus. There is a problem with how food moves through your esophagus. In some cases, you may need this procedure repeated at a later time to dilate the esophagus gradually. Tell a health care provider about: Any allergies you have. All medicines you are taking, including vitamins, herbs, eye drops, creams, and over-the-counter medicines. Any problems you or family members have had with anesthetic medicines. Any blood disorders you have. Any surgeries you have had. Any medical conditions you have. Any antibiotic medicines you are required to take before dental procedures. Whether you are pregnant or may be pregnant. What are the risks? Generally, this is a safe procedure. However, problems may occur, including: Bleeding due to a tear in the lining of the esophagus. A hole, or perforation, in the esophagus. What happens before the procedure? Ask your health care provider about: Changing or stopping your regular medicines. This is especially important if you are taking diabetes medicines or blood thinners. Taking medicines such as aspirin and ibuprofen. These medicines can thin your blood. Do not take these medicines unless your health care provider tells you to take them. Taking over-the-counter medicines, vitamins, herbs, and supplements. Follow instructions from your health care provider about eating or drinking restrictions. Plan to have a responsible adult take you home from the hospital or clinic. Plan to have a responsible adult care for you for the time you are told after you leave the hospital or clinic. This is important. What happens during the procedure? You may be given a medicine to help you relax (sedative). A numbing medicine may be sprayed into the back of your throat, or you may gargle  the medicine. Your health care provider may perform the dilatation using various surgical instruments, such as: Simple dilators. This instrument is carefully placed in the esophagus to stretch it. Guided wire bougies. This involves using an endoscope to insert a wire into the esophagus. A dilator is passed over this wire to enlarge the esophagus. Then the wire is removed. Balloon dilators. An endoscope with a small balloon is inserted into the esophagus. The balloon is inflated to stretch the esophagus and open it up. The procedure may vary among health care providers and hospitals. What can I expect after the procedure? Your blood pressure, heart rate, breathing rate, and blood oxygen level will be monitored until you leave the hospital or clinic. Your throat may feel slightly sore and numb. This will get better over time. You will not be allowed to eat or drink until your throat is no longer numb. When you are able to drink, urinate, and sit on the edge of the bed without nausea or dizziness, you may be able to return home. Follow these instructions at home: Take over-the-counter and prescription medicines only as told by your health care provider. If you  were given a sedative during the procedure, it can affect you for several hours. Do not drive or operate machinery until your health care provider says that it is safe. Plan to have a responsible adult care for you for the time you are told. This is important. Follow instructions from your health care provider about any eating or drinking restrictions. Do not use any products that contain nicotine or tobacco, such as cigarettes, e-cigarettes, and chewing tobacco. If you need help quitting, ask your health care provider. Keep all follow-up visits. This is important. Contact a health care provider if: You have a fever. You have pain that is not relieved by medicine. Get help right away if: You have chest pain. You have trouble breathing. You  have trouble swallowing. You vomit blood. You have black, tarry, or bloody stools. These symptoms may represent a serious problem that is an emergency. Do not wait to see if the symptoms will go away. Get medical help right away. Call your local emergency services (911 in the [place] Do not drive yourself to the hospital. Summary Esophageal dilatation, also called esophageal dilation, is a procedure to widen or open a blocked or narrowed part of the esophagus. Plan to have a responsible adult take you home from the hospital or clinic. For this procedure, a numbing medicine may be sprayed into the back of your throat, or you may gargle the medicine. Do not drive or operate machinery until your health care provider says that it is safe. This information is not intended to replace advice given to you by your health care provider. Make sure you discuss any questions you have with your health care provider. Document Revised: 03/23/2020 Document Reviewed: 03/23/2020 Elsevier Patient Education  [place]. Colonoscopy, Adult, Care After This sheet gives you information about how to care for yourself after your procedure. Your health care provider may also give you more specific instructions. If you have problems or questions, contact your health care provider. What can I expect after the procedure? After the procedure, it is common to have: A small amount of blood in your stool for 24 hours after the procedure. Some gas. Mild cramping or bloating of your abdomen. Follow these instructions at home: Eating and drinking  Drink enough fluid to keep your urine pale yellow. Follow instructions from your health care provider about eating or drinking restrictions. Resume your normal diet as instructed by your health care provider. Avoid heavy or fried foods that are hard to digest. Activity Rest as told by your health care provider. Avoid sitting for a long time without moving. Get up to take  short walks every 1-2 hours. This is important to improve blood flow and breathing. Ask for help if you feel weak or unsteady. Return to your normal activities as told by your health care provider. Ask your health care provider what activities are safe for you. Managing cramping and bloating  Try walking around when you have cramps or feel bloated. Apply heat to your abdomen as told by your health care provider. Use the heat source that your health care provider recommends, such as a moist heat pack or a heating pad. Place a towel between your skin and the heat source. Leave the heat on for 20-30 minutes. Remove the heat if your skin turns bright red. This is especially important if you are unable to feel pain, heat, or cold. You may have a greater risk of getting burned. General instructions If you were given a  sedative during the procedure, it can affect you for several hours. Do not drive or operate machinery until your health care provider says that it is safe. For the first 24 hours after the procedure: Do not sign important documents. Do not drink alcohol. Do your regular daily activities at a slower pace than normal. Eat soft foods that are easy to digest. Take over-the-counter and prescription medicines only as told by your health care provider. Keep all follow-up visits as told by your health care provider. This is important. Contact a health care provider if: You have blood in your stool 2-3 days after the procedure. Get help right away if you have: More than a small spotting of blood in your stool. Large blood clots in your stool. Swelling of your abdomen. Nausea or vomiting. A fever. Increasing pain in your abdomen that is not relieved with medicine. Summary After the procedure, it is common to have a small amount of blood in your stool. You may also have mild cramping and bloating of your abdomen. If you were given a sedative during the procedure, it can affect you for  several hours. Do not drive or operate machinery until your health care provider says that it is safe. Get help right away if you have a lot of blood in your stool, nausea or vomiting, a fever, or increased pain in your abdomen. This information is not intended to replace advice given to you by your health care provider. Make sure you discuss any questions you have with your health care provider. Document Revised: 10/30/2019 Document Reviewed: 06/01/2019 Elsevier Patient Education  [place] After This sheet gives you information about how to care for yourself after your procedure. Your health care provider may also give you more specific instructions. If you have problems or questions, contact your health care provider. What can I expect after the procedure? After the procedure, it is common to have: Tiredness. Forgetfulness about what happened after the procedure. Impaired judgment for important decisions. Nausea or vomiting. Some difficulty with balance. Follow these instructions at home: For the time period you were told by your health care provider:   Rest as needed. Do not participate in activities where you could fall or become injured. Do not drive or use machinery. Do not drink alcohol. Do not take sleeping pills or medicines that cause drowsiness. Do not make important decisions or sign legal documents. Do not take care of children on your own. Eating and drinking Follow the diet that is recommended by your health care provider. Drink enough fluid to keep your urine pale yellow. If you vomit: Drink water, juice, or soup when you can drink without vomiting. Make sure you have little or no nausea before eating solid foods. General instructions Have a responsible adult stay with you for the time you are told. It is important to have someone help care for you until you are awake and alert. Take over-the-counter and prescription medicines  only as told by your health care provider. If you have sleep apnea, surgery and certain medicines can increase your risk for breathing problems. Follow instructions from your health care provider about wearing your sleep device: Anytime you are sleeping, including during daytime naps. While taking prescription pain medicines, sleeping medicines, or medicines that make you drowsy. Avoid smoking. Keep all follow-up visits as told by your health care provider. This is important. Contact a health care provider if: You keep feeling nauseous or you keep vomiting. You  feel light-headed. You are still sleepy or having trouble with balance after 24 hours. You develop a rash. You have a fever. You have redness or swelling around the IV site. Get help right away if: You have trouble breathing. You have new-onset confusion at home. Summary For several hours after your procedure, you may feel tired. You may also be forgetful and have poor judgment. Have a responsible adult stay with you for the time you are told. It is important to have someone help care for you until you are awake and alert. Rest as told. Do not drive or operate machinery. Do not drink alcohol or take sleeping pills. Get help right away if you have trouble breathing, or if you suddenly become confused. This information is not intended to replace advice given to you by your health care provider. Make sure you discuss any questions you have with your health care provider. Document Revised: 07/21/2020 Document Reviewed: 10/08/2019 Elsevier Patient Education  2022 [employer].

## 2021-08-23 ENCOUNTER — Encounter (HOSPITAL_COMMUNITY): Payer: Self-pay

## 2021-08-23 ENCOUNTER — Encounter (HOSPITAL_COMMUNITY)
Admission: RE | Admit: 2021-08-23 | Discharge: 2021-08-23 | Disposition: A | Discharge status: Home / Self Care | Payer: 59 | Source: Ambulatory Visit | Attending: Internal Medicine | Admitting: Internal Medicine

## 2021-08-23 DIAGNOSIS — Z01818 Encounter for other preprocedural examination: Secondary | ICD-10-CM | POA: Diagnosis not present

## 2021-08-23 LAB — BASIC METABOLIC PANEL
Anion gap: 9 (ref 5–15)
BUN: 13 mg/dL (ref 6–20)
CO2: 27 mmol/L (ref 22–32)
Calcium: 9.1 mg/dL (ref 8.9–10.3)
Chloride: 102 mmol/L (ref 98–111)
Creatinine, Ser: 0.77 mg/dL (ref 0.44–1.00)
GFR, Estimated: >60 mL/min (ref >60)
Glucose, Bld: 106 mg/dL — ABNORMAL HIGH (ref 70–99)
Potassium: 3.9 mmol/L (ref 3.5–5.1)
Sodium: 138 mmol/L (ref 135–145)

## 2021-08-23 LAB — HCG, QUANTITATIVE, PREGNANCY: hCG, Beta Chain, Quant, S: 2 m[IU]/mL (ref <5)

## 2021-08-24 ENCOUNTER — Telehealth: Payer: Self-pay

## 2021-08-24 NOTE — Telephone Encounter (Signed)
Called pt to inform her TCS/EGD/DIL w/Propofol ASA 3 w/Dr. Abbey Chatters has to be cancelled for tomorrow d/t issue with city water supply. She prefers to have procedure done on a Friday. Will call her when November schedule is available.

## 2021-08-29 ENCOUNTER — Other Ambulatory Visit: Payer: Self-pay

## 2021-08-29 ENCOUNTER — Ambulatory Visit: Payer: 59

## 2021-08-29 ENCOUNTER — Encounter: Payer: Self-pay | Admitting: *Deleted

## 2021-08-29 ENCOUNTER — Ambulatory Visit (INDEPENDENT_AMBULATORY_CARE_PROVIDER_SITE_OTHER): Payer: 59 | Admitting: Orthopaedic Surgery

## 2021-08-29 ENCOUNTER — Encounter: Payer: Self-pay | Admitting: Orthopaedic Surgery

## 2021-08-29 DIAGNOSIS — S42255D Nondisplaced fracture of greater tuberosity of left humerus, subsequent encounter for fracture with routine healing: Secondary | ICD-10-CM

## 2021-08-29 DIAGNOSIS — G8929 Other chronic pain: Secondary | ICD-10-CM

## 2021-08-29 DIAGNOSIS — S42255A Nondisplaced fracture of greater tuberosity of left humerus, initial encounter for closed fracture: Secondary | ICD-10-CM

## 2021-08-29 NOTE — Telephone Encounter (Signed)
Called pt. She has been rescheduled to 11/4 at [facility] will mail new prep instructions and also will let know if she needs new pre-op appt. Message sent to endo

## 2021-08-29 NOTE — Progress Notes (Signed)
I have less pain.  Her left shoulder is less painful.  NV intact.  ROM limited.  X-rays were done of the left shoulder, reported separately.  Encounter Diagnoses  Name Primary?   Chronic left shoulder pain Yes   Closed nondisplaced fracture of greater tuberosity of left humerus with routine healing, subsequent encounter    Begin gentle ROM exercises, walk fingers up wall, no heavy lifting.  Return in three weeks.  X-rays then of the left shoulder.  Call if any problem.  Precautions discussed.  Marland Kitchenwkx

## 2021-09-08 ENCOUNTER — Other Ambulatory Visit: Payer: Self-pay | Admitting: Nurse Practitioner

## 2021-09-08 ENCOUNTER — Other Ambulatory Visit: Payer: Self-pay | Admitting: Family Medicine

## 2021-09-08 DIAGNOSIS — F324 Major depressive disorder, single episode, in partial remission: Secondary | ICD-10-CM

## 2021-09-08 DIAGNOSIS — E1165 Type 2 diabetes mellitus with hyperglycemia: Secondary | ICD-10-CM

## 2021-09-10 ENCOUNTER — Other Ambulatory Visit: Payer: Self-pay

## 2021-09-10 DIAGNOSIS — E1165 Type 2 diabetes mellitus with hyperglycemia: Secondary | ICD-10-CM

## 2021-09-10 DIAGNOSIS — J302 Other seasonal allergic rhinitis: Secondary | ICD-10-CM

## 2021-09-14 ENCOUNTER — Encounter: Payer: Self-pay | Admitting: Emergency Medicine

## 2021-09-14 ENCOUNTER — Telehealth: Payer: Self-pay | Admitting: *Deleted

## 2021-09-14 ENCOUNTER — Ambulatory Visit
Admission: EM | Admit: 2021-09-14 | Discharge: 2021-09-14 | Disposition: A | Discharge status: Home / Self Care | Payer: 59 | Attending: Physician Assistant | Admitting: Physician Assistant

## 2021-09-14 ENCOUNTER — Other Ambulatory Visit: Payer: Self-pay

## 2021-09-14 DIAGNOSIS — H6692 Otitis media, unspecified, left ear: Secondary | ICD-10-CM | POA: Diagnosis not present

## 2021-09-14 MED ORDER — HYDROCODONE-ACETAMINOPHEN 5-325 MG PO TABS: 1.0000 | ORAL_TABLET | Freq: Three times a day (TID) | ORAL | 0 refills | Status: DC | PRN

## 2021-09-14 MED ORDER — AMOXICILLIN 500 MG PO CAPS: 500.0000 mg | ORAL_CAPSULE | Freq: Three times a day (TID) | ORAL | 0 refills | Status: DC

## 2021-09-14 NOTE — ED Triage Notes (Signed)
Patient c/o left sided ear ache and sore throat x 1.5 weeks.

## 2021-09-14 NOTE — Telephone Encounter (Signed)
[name] called for a refill on her hydrocodone. Per PMP last filled 08/15/21. Next appt is 10/20/21.

## 2021-09-14 NOTE — Telephone Encounter (Signed)
Notified. 

## 2021-09-15 MED ORDER — TRULICITY 0.75 MG/0.5ML ~~LOC~~ SOAJ: SUBCUTANEOUS | 0 refills | Status: DC

## 2021-09-15 MED ORDER — FLUTICASONE PROPIONATE 50 MCG/ACT NA SUSP: 2.0000 | Freq: Every day | NASAL | 0 refills | Status: DC

## 2021-09-15 MED ORDER — ATORVASTATIN CALCIUM 40 MG PO TABS: 40.0000 mg | ORAL_TABLET | Freq: Every day | ORAL | 0 refills | Status: DC

## 2021-09-15 NOTE — ED Provider Notes (Signed)
[facility]    CSN: [id number] Arrival date & time: 09/14/21  1751      History   Chief Complaint Chief Complaint  Patient presents with   Otalgia   Sore Throat    HPI [name] is a 55 y.o. female.   The history is provided by the patient. No language interpreter was used.  Otalgia Location:  Left Behind ear:  No abnormality Quality:  Aching Severity:  Moderate Onset quality:  Gradual Duration:  2 days Timing:  Constant Progression:  Worsening Chronicity:  New Relieved by:  Nothing Worsened by:  Nothing Ineffective treatments:  None tried Associated symptoms: congestion   Sore Throat   Past Medical History:  Diagnosis Date   Anxiety    Blood transfusion without reported diagnosis 1999   due to heavy menses   Breast cancer ([place]) 05/22/2011   03/01/11, Stage 2, s/p lumpectomy, chemo/xrt   DDD (degenerative disc disease), lumbar    Depression 06/22/2011   Diverticulitis    DM (diabetes mellitus) ([place]) 06/22/2011   Genital warts    GERD (gastroesophageal reflux disease) 06/22/2011   Hx of adenomatous colonic polyps 10/2007   65m sigmoid tubular adenoma, FH colon cancer, mother in mid-50s   Hypercholesterolemia 06/22/2011   Hypertension 06/22/2011   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 05/22/2011   Iron deficiency anemia 03/25/2015   Morbid obesity ([place]    Shortness of breath    STD (sexually transmitted disease)    HPV, 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   Vaginal bleeding 03/08/2014    Patient Active Problem List   Diagnosis Date Noted   Dyspepsia 08/09/2021   Lumbosacral radiculopathy 07/14/2021   Constipation 03/21/2021   Degenerative disc disease, lumbar 02/23/2021   Seasonal allergies 02/02/2021   Diabetes mellitus without complication ([place] 002/23/3612  Nausea without vomiting 12/22/2020   Lipoma of right upper extremity 10/27/2020   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   Abdominal pain 12/22/2019   Central adiposity 04/12/2018   Seasonal affective disorder ([place] 12/15/2017   Chest pain 02/27/2017   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   Panic disorder 01/09/2017   Rectal bleeding 09/06/2016   Abdominal cramping 09/06/2016   Fatty liver 09/06/2016   Dehydration 12/15/2015   Orthostatic dizziness 12/15/2015   Nausea vomiting and diarrhea 12/15/2015   Iron deficiency anemia 03/25/2015   Excessive or frequent menstruation 04/08/2014   Dysmenorrhea 04/08/2014   H/O adenomatous polyp of colon 01/24/2012   FH: colon cancer 01/24/2012   Esophageal dysphagia 01/24/2012   Chronic diarrhea 01/24/2012   Depression 06/22/2011   GERD (gastroesophageal reflux disease) 06/22/2011   Type 2 diabetes mellitus with hyperglycemia, without long-term current use of insulin (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 Empiric dilation. Normal duodenum.  DILATATION & CURETTAGE/HYSTEROSCOPY WITH MYOSURE N/A 02/10/2015   Procedure: DILATATION & CURETTAGE/HYSTEROSCOPY WITH MYOSURE;  Surgeon: Nunzio Cobbs, MD;  Location: [facility];  Service: Gynecology;  Laterality: N/A;   ESOPHAGOGASTRODUODENOSCOPY (EGD) WITH PROPOFOL N/A 01/11/2020   Normal esophagus,  s/p dilation, normal stomach, normal duodenum.    MALONEY DILATION N/A 01/11/2020   Procedure: Venia Minks DILATION;  Surgeon: Daneil Dolin, MD;  [facility];  Service: Endoscopy;  Laterality: N/A;   MM BREAST STEREO BX*L*R/S     rt.   POLYPECTOMY  09/17/2016   Procedure: POLYPECTOMY;  Surgeon: Daneil Dolin, MD;  [facility];  Service: Endoscopy;;  ascending colon   PORT-A-CATH REMOVAL  05/26/2012   Procedure: REMOVAL PORT-A-CATH;  Surgeon: Jamesetta So, MD;  Location: [facility];  Service: General;  Laterality: N/A;  Minor Room   PORTACATH PLACEMENT      OB History     Gravida  4   Para  3   Term  0   Preterm  0   AB  1   Living  3      SAB  0   IAB  0   Ectopic  0   Multiple  0   Live Births  3            Home Medications    Prior to Admission medications   Medication Sig Start Date End Date Taking? Authorizing Provider  amoxicillin (AMOXIL) 500 MG capsule Take 1 capsule (500 mg total) by mouth 3 (three) times daily. 09/14/21  Yes Caryl Ada K, PA-C  atorvastatin (LIPITOR) 40 MG tablet Take 1 tablet (40 mg total) by mouth at bedtime. 09/15/21   Nilda Simmer, NP  blood glucose meter kit and supplies Dispense based on patient and insurance preference. Use up to four times daily as directed. (FOR ICD-10 E10.9, E11.9). 01/05/20   Mikey Kirschner, MD  cetirizine (ZYRTEC) 10 MG tablet Take 1 tablet by mouth once daily 07/17/21   Elvia Collum M, DO  citalopram (CELEXA) 40 MG tablet Take 1 tablet by mouth once daily 09/11/21   Coral Spikes, DO  Continuous Blood Gluc Receiver (FREESTYLE LIBRE 14 DAY READER) DEVI Use as directed. 01/12/21   Erven Colla, DO  Continuous Blood Gluc Sensor (FREESTYLE LIBRE 14 DAY SENSOR) MISC Use as directed 01/12/21   Elvia Collum M, DO  Dulaglutide (TRULICITY) 1.61 WR/6.0AV SOPN INJECT 0.75 MG SUBCUTANEOUSLY ONCE A WEEK 09/15/21   Nilda Simmer, NP  DULoxetine (CYMBALTA) 30 MG capsule Take 1  capsule (30 mg total) by mouth at bedtime. For nerve pain- can take Celexa 40 mg daily- but cannot increase Duloxetine on current Celexa dose 07/14/21   Lovorn, Jinny Blossom, MD  ergocalciferol (VITAMIN D2) 1.25 MG (50000 UT) capsule Take 1 capsule (50,000 Units total) by mouth once a week. 02/06/21   Derek Jack, MD  fluticasone (FLONASE) 50 MCG/ACT nasal spray Place 2 sprays into both nostrils daily. prn 09/15/21   Nilda Simmer, NP  HYDROcodone-acetaminophen (NORCO/VICODIN) 5-325 MG tablet Take 1 tablet by mouth every 8 (eight) hours as needed for severe pain. 09/14/21   Lovorn, Jinny Blossom, MD  hydrocortisone (ANUSOL-HC) 2.5 % rectal cream Place 1 application rectally 2 (two) times daily. Patient taking differently: Place 1 application rectally 2 (two) times daily as needed for hemorrhoids or anal itching. 08/09/21   Annitta Needs, NP  linaclotide [facility]) 290 MCG CAPS capsule Take 1 capsule (290 mcg  total) by mouth daily before breakfast. 07/12/21   Annitta Needs, NP  lisinopril (ZESTRIL) 10 MG tablet Take 1 tablet by mouth once daily 08/07/21   Nilda Simmer, NP  metFORMIN (GLUCOPHAGE-XR) 750 MG 24 hr tablet TAKE 1 TABLET BY MOUTH ONCE DAILY WITH BREAKFAST . APPOINTMENT REQUIRED FOR FUTURE REFILLS 09/15/21   Nilda Simmer, NP  Omega-3 Fatty Acids (FISH OIL) 1000 MG CAPS Take 1,000 mg by mouth daily.    [provider]  ondansetron (ZOFRAN) 4 MG tablet Take 1 tablet (4 mg total) by mouth every 8 (eight) hours as needed for nausea or vomiting. 07/14/21   Lovorn, Jinny Blossom, MD  ondansetron (ZOFRAN-ODT) 4 MG disintegrating tablet Take 1 tablet (4 mg total) by mouth 3 (three) times daily before meals. As needed for nausea 03/21/21   Annitta Needs, NP  pantoprazole (PROTONIX) 40 MG tablet Take 1 tablet (40 mg total) by mouth 2 (two) times daily before a meal. 03/21/21   Annitta Needs, NP    Family History Family History  Problem Relation Age of Onset   Colon cancer Mother        mid-50s,  died of metastatic disease   Cancer Mother        liver   Coronary artery disease Mother    Diabetes type I Mother    Peripheral vascular disease Mother        Carotid disease in her 15s   Coronary artery disease Father        CAD in his 82s   Diabetes Father    Diabetes type I Father    Hypertension Father    Bipolar disorder Sister    Coronary artery disease Brother        MI in his 33s   Hypertension Brother    Hypertension Maternal Grandmother    Hyperlipidemia Maternal Grandmother    Stroke Maternal Grandmother    Hypertension Maternal Grandfather    Diabetes Maternal Grandfather    Diabetes Paternal Grandmother    Heart disease Paternal Grandfather     Social History Social History   Tobacco Use   Smoking status: Never   Smokeless tobacco: Never  Vaping Use   Vaping Use: Never used  Substance Use Topics   Alcohol use: No    Alcohol/week: 0.0 standard drinks   Drug use: No     Allergies   Flagyl [metronidazole], Lansoprazole, and Pravastatin   Review of Systems Review of Systems  HENT:  Positive for congestion and ear pain.   All other systems reviewed and are negative.   Physical Exam Triage Vital Signs ED Triage Vitals  Enc Vitals Group     BP 09/14/21 1937 128/84     Pulse Rate 09/14/21 1937 91     Resp 09/14/21 1937 16     Temp 09/14/21 1937 98.4 F (36.9 C)     Temp Source 09/14/21 1937 Oral     SpO2 09/14/21 1937 93 %     Weight --      Height --      Head Circumference --      Peak Flow --      Pain Score 09/14/21 1936 8     Pain Loc --      Pain Edu? --      Excl. in [place]? --    No data found.  Updated Vital Signs BP 128/84 (BP Location: Right Arm)   Pulse 91   Temp 98.4 F (36.9  C) (Oral)   Resp 16   LMP 04/05/2016 Comment: spotting 11/2016 and 12/2016  SpO2 93%   Visual Acuity Right Eye Distance:   Left Eye Distance:   Bilateral Distance:    Right Eye Near:   Left Eye Near:    Bilateral Near:     Physical  Exam Vitals and nursing note reviewed.  Constitutional:      Appearance: She is well-developed.  HENT:     Head: Normocephalic.     Right Ear: Tympanic membrane normal.     Left Ear: Tympanic membrane is erythematous.  Eyes:     Conjunctiva/sclera: Conjunctivae normal.  Pulmonary:     Effort: Pulmonary effort is normal.  Abdominal:     General: There is no distension.  Musculoskeletal:        General: Normal range of motion.     Cervical back: Normal range of motion.  Neurological:     Mental Status: She is alert and oriented to person, place, and time.     UC Treatments / Results  Labs (all labs ordered are listed, but only abnormal results are displayed) Labs Reviewed - No data to display  EKG   Radiology No results found.  Procedures Procedures (including critical care time)  Medications Ordered in UC Medications - No data to display  Initial Impression / Assessment and Plan / UC Course  I have reviewed the triage vital signs and the nursing notes.  Pertinent labs & imaging results that were available during my care of the patient were reviewed by me and considered in my medical decision making (see chart for details).       Final Clinical Impressions(s) / UC Diagnoses   Final diagnoses:  Left otitis media, unspecified otitis media type   Discharge Instructions   None    ED Prescriptions     Medication Sig Dispense Auth. Provider   amoxicillin (AMOXIL) 500 MG capsule Take 1 capsule (500 mg total) by mouth 3 (three) times daily. 30 capsule Fransico Meadow, [place]      PDMP not reviewed this encounter. An After Visit Summary was printed and given to the patient.    Fransico Meadow, [place] 09/15/21 1246

## 2021-09-19 ENCOUNTER — Encounter (HOSPITAL_COMMUNITY)
Admission: RE | Admit: 2021-09-19 | Discharge: 2021-09-19 | Disposition: A | Discharge status: Home / Self Care | Payer: 59 | Source: Ambulatory Visit | Attending: Internal Medicine | Admitting: Internal Medicine

## 2021-09-19 ENCOUNTER — Encounter (HOSPITAL_COMMUNITY): Payer: Self-pay

## 2021-09-19 ENCOUNTER — Ambulatory Visit (INDEPENDENT_AMBULATORY_CARE_PROVIDER_SITE_OTHER): Payer: 59 | Admitting: Orthopaedic Surgery

## 2021-09-19 ENCOUNTER — Ambulatory Visit: Payer: 59

## 2021-09-19 ENCOUNTER — Encounter: Payer: Self-pay | Admitting: Orthopaedic Surgery

## 2021-09-19 ENCOUNTER — Other Ambulatory Visit: Payer: Self-pay

## 2021-09-19 VITALS — BP 126/79 | HR 91 | Temp 98.6°F | Resp 18 | Ht 64.0 in | Wt 240.0 lb

## 2021-09-19 DIAGNOSIS — S42255D Nondisplaced fracture of greater tuberosity of left humerus, subsequent encounter for fracture with routine healing: Secondary | ICD-10-CM

## 2021-09-19 DIAGNOSIS — Z01818 Encounter for other preprocedural examination: Secondary | ICD-10-CM

## 2021-09-19 HISTORY — DX: Other complications of anesthesia, initial encounter: T88.59XA

## 2021-09-19 NOTE — Progress Notes (Signed)
My shoulder is better.  She has better motion of the shoulder but has pain past 90 degrees.  X-rays were done and reported separately of the left shoulder.  She has signs of possible impingement from the callus formation.  I have given exercises to do.  Encounter Diagnosis  Name Primary?   Closed nondisplaced fracture of greater tuberosity of left humerus with routine healing, subsequent encounter Yes   Return in one month. X-rays left shoulder on return.  Do the exercises.  Call if any problem.  Precautions discussed.  Electronically [place], MD 11/1/20223:09 PM

## 2021-09-19 NOTE — Patient Instructions (Signed)
[name]  09/19/2021     @PREFPERIOPPHARMACY @   Your procedure is scheduled on 09/22/2021.  Report to [name] at 6:45 A.M.  Call this number if you have problems the morning of surgery:  [phone]   Remember:    Please follow the diet and prep instructions given to you by the doctors office.          Take these medicines the morning of surgery with A SIP OF WATER : Zyrtec, Celexa, Hydrocodone, Zofran and Protonix.     Do not wear jewelry, make-up or nail polish.  Do not wear lotions, powders, or perfumes, or deodorant.  Do not shave 48 hours prior to surgery.  Men may shave face and neck.  Do not bring valuables to the hospital.  [facility] is not responsible for any belongings or valuables.  Contacts, dentures or bridgework may not be worn into surgery.  Leave your suitcase in the car.  After surgery it may be brought to your room.  For patients admitted to the hospital, discharge time will be determined by your treatment team.  Patients discharged the day of surgery will not be allowed to drive home.   Name and phone number of your driver:   family Special instructions:  N/a  Please read over the following fact sheets that you were given. Care and Recovery After Surgery  Upper Endoscopy, Adult Upper endoscopy is a procedure to look inside the upper GI (gastrointestinal) tract. The upper GI tract is made up of: The part of the body that moves food from your mouth to your stomach (esophagus). The stomach. The first part of your small intestine (duodenum). This procedure is also called esophagogastroduodenoscopy (EGD) or gastroscopy. In this procedure, your health care provider passes a thin, flexible tube (endoscope) through your mouth and down your esophagus into your stomach. A small camera is attached to the end of the tube. Images from the camera appear on a monitor in the exam room. During this procedure, your health care provider may also remove a small  piece of tissue to be sent to a lab and examined under a microscope (biopsy). Your health care provider may do an upper endoscopy to diagnose cancers of the upper GI tract. You may also have this procedure to find the cause of other conditions, such as: Stomach pain. Heartburn. Pain or problems when swallowing. Nausea and vomiting. Stomach bleeding. Stomach ulcers. Tell a health care provider about: Any allergies you have. All medicines you are taking, including vitamins, herbs, eye drops, creams, and over-the-counter medicines. Any problems you or family members have had with anesthetic medicines. Any blood disorders you have. Any surgeries you have had. Any medical conditions you have. Whether you are pregnant or may be pregnant. What are the risks? Generally, this is a safe procedure. However, problems may occur, including: Infection. Bleeding. Allergic reactions to medicines. A tear or hole (perforation) in the esophagus, stomach, or duodenum. What happens before the procedure? Staying hydrated Follow instructions from your health care provider about hydration, which may include: Up to 2 hours before the procedure - you may continue to drink clear liquids, such as water, clear fruit juice, black coffee, and plain tea.  Eating and drinking restrictions Follow instructions from your health care provider about eating and drinking, which may include: 8 hours before the procedure - stop eating heavy meals or foods, such as meat, fried foods, or fatty foods. 6 hours before the procedure - stop eating  light meals or foods, such as toast or cereal. 6 hours before the procedure - stop drinking milk or drinks that contain milk. 2 hours before the procedure - stop drinking clear liquids. Medicines Ask your health care provider about: Changing or stopping your regular medicines. This is especially important if you are taking diabetes medicines or blood thinners. Taking medicines such as  aspirin and ibuprofen. These medicines can thin your blood. Do not take these medicines unless your health care provider tells you to take them. Taking over-the-counter medicines, vitamins, herbs, and supplements. General instructions Plan to have someone take you home from the hospital or clinic. If you will be going home right after the procedure, plan to have someone with you for 24 hours. Ask your health care provider what steps will be taken to help prevent infection. What happens during the procedure?  An IV will be inserted into one of your veins. You may be given one or more of the following: A medicine to help you relax (sedative). A medicine to numb the throat (local anesthetic). You will lie on your left side on an exam table. Your health care provider will pass the endoscope through your mouth and down your esophagus. Your health care provider will use the scope to check the inside of your esophagus, stomach, and duodenum. Biopsies may be taken. The endoscope will be removed. The procedure may vary among health care providers and hospitals. What happens after the procedure? Your blood pressure, heart rate, breathing rate, and blood oxygen level will be monitored until you leave the hospital or clinic. Do not drive for 24 hours if you were given a sedative during your procedure. When your throat is no longer numb, you may be given some fluids to drink. It is up to you to get the results of your procedure. Ask your health care provider, or the department that is doing the procedure, when your results will be ready. Summary Upper endoscopy is a procedure to look inside the upper GI tract. During the procedure, an IV will be inserted into one of your veins. You may be given a medicine to help you relax. A medicine will be used to numb your throat. The endoscope will be passed through your mouth and down your esophagus. This information is not intended to replace advice given to you  by your health care provider. Make sure you discuss any questions you have with your health care provider. Document Revised: 04/30/2018 Document Reviewed: 04/07/2018 Elsevier Patient Education  2022 [place]. Colonoscopy, Adult A colonoscopy is a procedure to look at the entire large intestine. This procedure is done using a long, thin, flexible tube that has a camera on the end. You may have a colonoscopy: As a part of normal colorectal screening. If you have certain symptoms, such as: A low number of red blood cells in your blood (anemia). Diarrhea that does not go away. Pain in your abdomen. Blood in your stool. A colonoscopy can help screen for and diagnose medical problems, including: Tumors. Extra tissue that grows where mucus forms (polyps). Inflammation. Areas of bleeding. Tell your health care provider about: Any allergies you have. All medicines you are taking, including vitamins, herbs, eye drops, creams, and over-the-counter medicines. Any problems you or family members have had with anesthetic medicines. Any blood disorders you have. Any surgeries you have had. Any medical conditions you have. Any problems you have had with having bowel movements. Whether you are pregnant or may be pregnant. What  are the risks? Generally, this is a safe procedure. However, problems may occur, including: Bleeding. Damage to your intestine. Allergic reactions to medicines given during the procedure. Infection. This is rare. What happens before the procedure? Eating and drinking restrictions Follow instructions from your health care provider about eating or drinking restrictions, which may include: A few days before the procedure: Follow a low-fiber diet. Avoid nuts, seeds, dried fruit, raw fruits, and vegetables. 1-3 days before the procedure: Eat only gelatin dessert or ice pops. Drink only clear liquids, such as water, clear juice, clear broth or bouillon, black coffee or  tea, or clear soft drinks or sports drinks. Avoid liquids that contain red or purple dye. The day of the procedure: Do not eat solid foods. You may continue to drink clear liquids until up to 2 hours before the procedure. Do not eat or drink anything starting 2 hours before the procedure, or within the time period that your health care provider recommends. Bowel prep If you were prescribed a bowel prep to take by mouth (orally) to clean out your colon: Take it as told by your health care provider. Starting the day before your procedure, you will need to drink a large amount of liquid medicine. The liquid will cause you to have many bowel movements of loose stool until your stool becomes almost clear or light green. If your skin or the opening between the buttocks (anus) gets irritated from diarrhea, you may relieve the irritation using: Wipes with medicine in them, such as adult wet wipes with aloe and vitamin E. A product to soothe skin, such as petroleum jelly. If you vomit while drinking the bowel prep: Take a break for up to 60 minutes. Begin the bowel prep again. Call your health care provider if you keep vomiting or you cannot take the bowel prep without vomiting. To clean out your colon, you may also be given: Laxative medicines. These help you have a bowel movement. Instructions for enema use. An enema is liquid medicine injected into your rectum. Medicines Ask your health care provider about: Changing or stopping your regular medicines or supplements. This is especially important if you are taking iron supplements, diabetes medicines, or blood thinners. Taking medicines such as aspirin and ibuprofen. These medicines can thin your blood. Do not take these medicines unless your health care provider tells you to take them. Taking over-the-counter medicines, vitamins, herbs, and supplements. General instructions Ask your health care provider what steps will be taken to help prevent  infection. These may include washing skin with a germ-killing soap. Plan to have someone take you home from the hospital or clinic. What happens during the procedure?  An IV will be inserted into one of your veins. You may be given one or more of the following: A medicine to help you relax (sedative). A medicine to numb the area (local anesthetic). A medicine to make you fall asleep (general anesthetic). This is rarely needed. You will lie on your side with your knees bent. The tube will: Have oil or gel put on it (be lubricated). Be inserted into your anus. Be gently eased through all parts of your large intestine. Air will be sent into your colon to keep it open. This may cause some pressure or cramping. Images will be taken with the camera and will appear on a screen. A small tissue sample may be removed to be looked at under a microscope (biopsy). The tissue may be sent to a lab for testing if  any signs of problems are found. If small polyps are found, they may be removed and checked for cancer cells. When the procedure is finished, the tube will be removed. The procedure may vary among health care providers and hospitals. What happens after the procedure? Your blood pressure, heart rate, breathing rate, and blood oxygen level will be monitored until you leave the hospital or clinic. You may have a small amount of blood in your stool. You may pass gas and have mild cramping or bloating in your abdomen. This is caused by the air that was used to open your colon during the exam. Do not drive for 24 hours after the procedure. It is up to you to get the results of your procedure. Ask your health care provider, or the department that is doing the procedure, when your results will be ready. Summary A colonoscopy is a procedure to look at the entire large intestine. Follow instructions from your health care provider about eating and drinking before the procedure. If you were prescribed an  oral bowel prep to clean out your colon, take it as told by your health care provider. During the colonoscopy, a flexible tube with a camera on its end is inserted into the anus and then passed into the other parts of the large intestine. This information is not intended to replace advice given to you by your health care provider. Make sure you discuss any questions you have with your health care provider. Document Revised: 05/29/2019 Document Reviewed: 05/29/2019 Elsevier Patient Education  [place].

## 2021-09-22 ENCOUNTER — Ambulatory Visit (HOSPITAL_COMMUNITY): Payer: 59 | Admitting: Certified Registered Nurse Anesthetist

## 2021-09-22 ENCOUNTER — Other Ambulatory Visit: Payer: Self-pay

## 2021-09-22 ENCOUNTER — Ambulatory Visit (HOSPITAL_COMMUNITY)
Admission: RE | Admit: 2021-09-22 | Discharge: 2021-09-22 | Disposition: A | Discharge status: Home / Self Care | Payer: 59 | Attending: Internal Medicine | Admitting: Internal Medicine

## 2021-09-22 ENCOUNTER — Encounter (HOSPITAL_COMMUNITY)
Admission: RE | Disposition: A | Discharge status: Home / Self Care | Payer: Self-pay | Source: Home / Self Care | Attending: Internal Medicine

## 2021-09-22 ENCOUNTER — Encounter (HOSPITAL_COMMUNITY): Payer: Self-pay

## 2021-09-22 DIAGNOSIS — R1013 Epigastric pain: Secondary | ICD-10-CM | POA: Insufficient documentation

## 2021-09-22 DIAGNOSIS — Z881 Allergy status to other antibiotic agents status: Secondary | ICD-10-CM | POA: Insufficient documentation

## 2021-09-22 DIAGNOSIS — E119 Type 2 diabetes mellitus without complications: Secondary | ICD-10-CM | POA: Diagnosis not present

## 2021-09-22 DIAGNOSIS — Z8249 Family history of ischemic heart disease and other diseases of the circulatory system: Secondary | ICD-10-CM | POA: Insufficient documentation

## 2021-09-22 DIAGNOSIS — Z7985 Long-term (current) use of injectable non-insulin antidiabetic drugs: Secondary | ICD-10-CM | POA: Insufficient documentation

## 2021-09-22 DIAGNOSIS — Z86010 Personal history of colonic polyps: Secondary | ICD-10-CM | POA: Diagnosis not present

## 2021-09-22 DIAGNOSIS — D123 Benign neoplasm of transverse colon: Secondary | ICD-10-CM | POA: Insufficient documentation

## 2021-09-22 DIAGNOSIS — Z800 Family history of malignant neoplasm of digestive organs: Secondary | ICD-10-CM | POA: Insufficient documentation

## 2021-09-22 DIAGNOSIS — Z7984 Long term (current) use of oral hypoglycemic drugs: Secondary | ICD-10-CM | POA: Insufficient documentation

## 2021-09-22 DIAGNOSIS — Z1211 Encounter for screening for malignant neoplasm of colon: Secondary | ICD-10-CM | POA: Diagnosis present

## 2021-09-22 DIAGNOSIS — K317 Polyp of stomach and duodenum: Secondary | ICD-10-CM | POA: Diagnosis not present

## 2021-09-22 DIAGNOSIS — K648 Other hemorrhoids: Secondary | ICD-10-CM | POA: Diagnosis not present

## 2021-09-22 DIAGNOSIS — Z853 Personal history of malignant neoplasm of breast: Secondary | ICD-10-CM | POA: Insufficient documentation

## 2021-09-22 DIAGNOSIS — D124 Benign neoplasm of descending colon: Secondary | ICD-10-CM | POA: Insufficient documentation

## 2021-09-22 DIAGNOSIS — K2970 Gastritis, unspecified, without bleeding: Secondary | ICD-10-CM

## 2021-09-22 DIAGNOSIS — Z01818 Encounter for other preprocedural examination: Secondary | ICD-10-CM

## 2021-09-22 DIAGNOSIS — Z888 Allergy status to other drugs, medicaments and biological substances status: Secondary | ICD-10-CM | POA: Insufficient documentation

## 2021-09-22 DIAGNOSIS — Z79899 Other long term (current) drug therapy: Secondary | ICD-10-CM | POA: Diagnosis not present

## 2021-09-22 DIAGNOSIS — Z833 Family history of diabetes mellitus: Secondary | ICD-10-CM | POA: Diagnosis not present

## 2021-09-22 DIAGNOSIS — E7800 Pure hypercholesterolemia, unspecified: Secondary | ICD-10-CM | POA: Diagnosis not present

## 2021-09-22 DIAGNOSIS — K2950 Unspecified chronic gastritis without bleeding: Secondary | ICD-10-CM | POA: Diagnosis not present

## 2021-09-22 DIAGNOSIS — I10 Essential (primary) hypertension: Secondary | ICD-10-CM | POA: Insufficient documentation

## 2021-09-22 HISTORY — PX: ESOPHAGOGASTRODUODENOSCOPY (EGD) WITH PROPOFOL: SHX5813

## 2021-09-22 HISTORY — PX: BIOPSY: SHX5522

## 2021-09-22 HISTORY — PX: BALLOON DILATION: SHX5330

## 2021-09-22 HISTORY — PX: COLONOSCOPY WITH PROPOFOL: SHX5780

## 2021-09-22 HISTORY — PX: POLYPECTOMY: SHX5525

## 2021-09-22 LAB — GLUCOSE, CAPILLARY: Glucose-Capillary: 116 mg/dL — ABNORMAL HIGH (ref 70–99)

## 2021-09-22 SURGERY — COLONOSCOPY WITH PROPOFOL
Anesthesia: General

## 2021-09-22 MED ORDER — LACTATED RINGERS IV SOLN: INTRAVENOUS | Status: DC

## 2021-09-22 MED ORDER — LIDOCAINE HCL (CARDIAC) PF 100 MG/5ML IV SOSY
PREFILLED_SYRINGE | INTRAVENOUS | Status: DC | PRN
Start: 2021-09-22 — End: 2021-09-22
  Administered 2021-09-22: 50 mg via INTRAVENOUS

## 2021-09-22 MED ORDER — PROPOFOL 10 MG/ML IV BOLUS
INTRAVENOUS | Status: DC | PRN
  Administered 2021-09-22: 100 mg via INTRAVENOUS
  Administered 2021-09-22: 30 mg via INTRAVENOUS

## 2021-09-22 MED ORDER — PROPOFOL 500 MG/50ML IV EMUL
INTRAVENOUS | Status: DC | PRN
  Administered 2021-09-22: 150 ug/kg/min via INTRAVENOUS

## 2021-09-22 NOTE — Anesthesia Postprocedure Evaluation (Signed)
Anesthesia Post Note  Patient: PAMMIE CHIRINO  Procedure(s) Performed: COLONOSCOPY WITH PROPOFOL ESOPHAGOGASTRODUODENOSCOPY (EGD) WITH PROPOFOL BALLOON DILATION POLYPECTOMY BIOPSY  Patient location during evaluation: Phase II Anesthesia Type: General Level of consciousness: awake Pain management: pain level controlled Vital Signs Assessment: post-procedure vital signs reviewed and stable Respiratory status: spontaneous breathing and respiratory function stable Cardiovascular status: blood pressure returned to baseline and stable Postop Assessment: no headache and no apparent nausea or vomiting Anesthetic complications: no Comments: Late entry   No notable events documented.   Last Vitals:  Vitals:   09/22/21 0849 09/22/21 0853  BP:  119/73  Pulse: 94   Resp: 16   Temp: 36.7 C   SpO2: 100%     Last Pain:  Vitals:   09/22/21 0849  TempSrc: Axillary  PainSc: 0-No pain                 Louann Sjogren

## 2021-09-22 NOTE — Op Note (Addendum)
[facility] Patient Name: [name] Procedure Date: 09/22/2021 7:58 AM MRN: [id number] Date of Birth: [birth year] Attending MD: Elon Alas. Abbey Chatters DO CSN: [id number] Age: 55 Admit Type: Outpatient Procedure:                Upper GI endoscopy Indications:              Epigastric abdominal pain, Dysphagia, Heartburn Providers:                Elon Alas. Abbey Chatters, DO, Lambert Mody, Nelma Rothman, Technician Referring MD:              Medicines:                See the Anesthesia note for documentation of the                            administered medications Complications:            No immediate complications. Estimated Blood Loss:     Estimated blood loss was minimal. Procedure:                Pre-Anesthesia Assessment:                           - The anesthesia plan was to use monitored                            anesthesia care (MAC).                           After obtaining informed consent, the endoscope was                            passed under direct vision. Throughout the                            procedure, the patient's blood pressure, pulse, and                            oxygen saturations were monitored continuously. The                            [id number]-H190 ([id number]) scope was introduced through the                            mouth, and advanced to the second part of duodenum.                            The upper GI endoscopy was accomplished without                            difficulty. The patient tolerated the procedure                            well. Scope In: 8:15:19  AM Scope Out: 8:21:36 AM Total Procedure Duration: 0 hours 6 minutes 17 seconds  Findings:      The Z-line was regular and was found 37 cm from the incisors.      No endoscopic abnormality was evident in the esophagus to explain the       patient's complaint of dysphagia. Preparations were made for empiric       dilation. A TTS dilator was passed through the  scope. Dilation with an       18-19-20 mm balloon dilator was performed to 20 mm. Dilation was       performed with a mild resistance at 20 mm. Estimated blood loss was none.      Diffuse moderate inflammation characterized by erythema was found in the       gastric body and in the gastric antrum. Biopsies were taken with a cold       forceps for Helicobacter pylori testing.      The duodenal bulb, first portion of the duodenum and second portion of       the duodenum were normal.      Multiple small sessile polyps with no bleeding and no stigmata of recent       bleeding were found in the gastric fundus. Biopsies were taken with a       cold forceps for histology. Impression:               - Z-line regular, 37 cm from the incisors.                           - Gastritis. Biopsied.                           - Normal duodenal bulb, first portion of the                            duodenum and second portion of the duodenum.                           - Multiple gastric polyps. Biopsied. Moderate Sedation:      Per Anesthesia Care Recommendation:           - Patient has a contact number available for                            emergencies. The signs and symptoms of potential                            delayed complications were discussed with the                            patient. Return to normal activities tomorrow.                            Written discharge instructions were provided to the                            patient.                           -  Resume previous diet.                           - Continue present medications.                           - Await pathology results.                           - Repeat upper endoscopy PRN for retreatment.                           - Return to GI clinic in 4 months.                           - Use a proton pump inhibitor PO BID. Procedure Code(s):        --- Professional ---                           [phone], Esophagogastroduodenoscopy,  flexible,                            transoral; with biopsy, single or multiple Diagnosis Code(s):        --- Professional ---                           K29.70, Gastritis, unspecified, without bleeding                           K31.7, Polyp of stomach and duodenum                           R10.13, Epigastric pain                           R13.10, Dysphagia, unspecified                           R12, Heartburn CPT copyright 2019 American Medical Association. All rights reserved. The codes documented in this report are preliminary and upon coder review may  be revised to meet current compliance requirements. Elon Alas. Abbey Chatters, DO [place] Abbey Chatters, DO 09/22/2021 8:25:31 AM This report has been signed electronically. Number of Addenda: 0

## 2021-09-22 NOTE — H&P (Signed)
Primary Care Physician:  Kathyrn Drown, MD Primary Gastroenterologist:  Dr. Abbey Chatters  Pre-Procedure History & Physical: HPI:  [name] is a 55 y.o. female is here for and EGD for dysphagia, abdominal pain, GERD and a colonoscopy for surveillance. Patient with history of adenomas and family history of colon cancer in mother in her mid 48s, succumbing to the disease.   Zofran TID before meals. Has breakthrough nausea. Protonix BID. Had some liquid dysphagia.No solid food dysphagia. Notes epigastric pain. No NSAIDs, as they burn her stomach.  Past Medical History:  Diagnosis Date   Anxiety    Blood transfusion without reported diagnosis 1999   due to heavy menses   Breast cancer ([place]) 05/22/2011   03/01/11, Stage 2, s/p lumpectomy, chemo/xrt   Complication of anesthesia    pt woke up during procedure   DDD (degenerative disc disease), lumbar    Depression 06/22/2011   Diverticulitis    DM (diabetes mellitus) ([place]) 06/22/2011   Genital warts    GERD (gastroesophageal reflux disease) 06/22/2011   Hx of adenomatous colonic polyps 10/2007   7m sigmoid tubular adenoma, FH colon cancer, mother in mid-50s   Hypercholesterolemia 06/22/2011   Hypertension 06/22/2011   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 05/22/2011   Iron deficiency anemia 03/25/2015   [place] [place]    Shortness of breath    STD (sexually transmitted disease)    HPV, 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 Empiric dilation. Normal duodenum.    DILATATION & CURETTAGE/HYSTEROSCOPY WITH MYOSURE N/A 02/10/2015   Procedure: DILATATION & CURETTAGE/HYSTEROSCOPY WITH MYOSURE;  Surgeon: BNunzio Cobbs MD;  [facility];  Service: Gynecology;  Laterality: N/A;   ESOPHAGOGASTRODUODENOSCOPY (EGD) WITH PROPOFOL N/A 01/11/2020   Normal esophagus, s/p dilation, normal stomach, normal duodenum.    MALONEY DILATION N/A 01/11/2020   Procedure: MVenia MinksDILATION;  Surgeon: RDaneil Dolin MD;  [facility];  Service: Endoscopy;  Laterality: N/A;   MM BREAST STEREO BX*L*R/S     rt.   POLYPECTOMY  09/17/2016   Procedure: POLYPECTOMY;  Surgeon: RDaneil Dolin MD;  Location: [facility];  Service: Endoscopy;;  ascending colon   PORT-A-CATH REMOVAL  05/26/2012   Procedure: REMOVAL PORT-A-CATH;  Surgeon: MJamesetta So MD;  Location: [facility] ORS;  Service: General;  Laterality: N/A;  Minor Room   PORTACATH PLACEMENT      Prior to Admission medications   Medication Sig Start Date End Date Taking? Authorizing Provider  amoxicillin (AMOXIL) 500 MG capsule Take 1 capsule (500 mg total) by mouth 3 (three) times daily. 09/14/21  Yes SCaryl AdaK, PA-C  atorvastatin (LIPITOR) 40 MG tablet Take 1 tablet (40 mg total) by mouth at bedtime. 09/15/21  Yes HNilda Simmer NP  cetirizine (ZYRTEC) 10 MG tablet Take 1 tablet by mouth once daily 07/17/21  Yes Taylor, Malena M, DO  citalopram (CELEXA) 40 MG tablet Take 1 tablet by  mouth once daily 09/11/21  Yes Cook, Jayce G, DO  Dulaglutide (TRULICITY) 0.07 MA/2.6JF SOPN INJECT 0.75 MG SUBCUTANEOUSLY ONCE A WEEK 09/15/21  Yes Pearson Forster C, NP  DULoxetine (CYMBALTA) 30 MG capsule Take 1 capsule (30 mg total) by mouth at bedtime. For nerve pain- can take Celexa 40 mg daily- but cannot increase Duloxetine on current Celexa dose 07/14/21  Yes Lovorn, Jinny Blossom, MD  ergocalciferol (VITAMIN D2) 1.25 MG  (50000 UT) capsule Take 1 capsule (50,000 Units total) by mouth once a week. 02/06/21  Yes Derek Jack, MD  fluticasone (FLONASE) 50 MCG/ACT nasal spray Place 2 sprays into both nostrils daily. prn 09/15/21  Yes Nilda Simmer, NP  HYDROcodone-acetaminophen (NORCO/VICODIN) 5-325 MG tablet Take 1 tablet by mouth every 8 (eight) hours as needed for severe pain. 09/14/21  Yes Lovorn, Jinny Blossom, MD  hydrocortisone (ANUSOL-HC) 2.5 % rectal cream Place 1 application rectally 2 (two) times daily. Patient taking differently: Place 1 application rectally 2 (two) times daily as needed for hemorrhoids or anal itching. 08/09/21  Yes Annitta Needs, NP  linaclotide [facility]) 290 MCG CAPS capsule Take 1 capsule (290 mcg total) by mouth daily before breakfast. 07/12/21  Yes Annitta Needs, NP  lisinopril (ZESTRIL) 10 MG tablet Take 1 tablet by mouth once daily 08/07/21  Yes Nilda Simmer, NP  metFORMIN (GLUCOPHAGE-XR) 750 MG 24 hr tablet TAKE 1 TABLET BY MOUTH ONCE DAILY WITH BREAKFAST . APPOINTMENT REQUIRED FOR FUTURE REFILLS 09/15/21  Yes Nilda Simmer, NP  Omega-3 Fatty Acids (FISH OIL) 1000 MG CAPS Take 1,000 mg by mouth daily.   Yes [provider]  ondansetron (ZOFRAN) 4 MG tablet Take 1 tablet (4 mg total) by mouth every 8 (eight) hours as needed for nausea or vomiting. 07/14/21  Yes Lovorn, Jinny Blossom, MD  ondansetron (ZOFRAN-ODT) 4 MG disintegrating tablet Take 1 tablet (4 mg total) by mouth 3 (three) times daily before meals. As needed for nausea 03/21/21  Yes Annitta Needs, NP  pantoprazole (PROTONIX) 40 MG tablet Take 1 tablet (40 mg total) by mouth 2 (two) times daily before a meal. 03/21/21  Yes Annitta Needs, NP  blood glucose meter kit and supplies Dispense based on patient and insurance preference. Use up to four times daily as directed. (FOR ICD-10 E10.9, E11.9). 01/05/20   Mikey Kirschner, MD  Continuous Blood Gluc Receiver (FREESTYLE LIBRE 14 DAY READER) DEVI Use as directed. 01/12/21    Erven Colla, DO  Continuous Blood Gluc Sensor (FREESTYLE LIBRE 14 DAY SENSOR) MISC Use as directed 01/12/21   Elvia Collum M, DO    Allergies as of 08/10/2021 - Review Complete 08/09/2021  Allergen Reaction Noted   Flagyl [metronidazole] Hives, Itching, and Swelling 10/15/2017   Lansoprazole Other (See Comments) 12/26/2017   Pravastatin Other (See Comments) 08/02/2015    Family History  Problem Relation Age of Onset   Colon cancer Mother        mid-50s, died of metastatic disease   Cancer Mother        liver   Coronary artery disease Mother    Diabetes type I Mother    Peripheral vascular disease Mother        Carotid disease in her 82s   Coronary artery disease Father        CAD in his 49s   Diabetes Father    Diabetes type I Father    Hypertension Father    Bipolar disorder Sister    Coronary artery  disease Brother        MI in his 44s   Hypertension Brother    Hypertension Maternal Grandmother    Hyperlipidemia Maternal Grandmother    Stroke Maternal Grandmother    Hypertension Maternal Grandfather    Diabetes Maternal Grandfather    Diabetes Paternal Grandmother    Heart disease Paternal Grandfather     Social History   Socioeconomic History   Marital status: Married    Spouse name: Not on file   Number of children: 3   Years of education: Not on file   Highest education level: Not on file  Occupational History   Occupation: child care    Employer: [employer]  Tobacco Use   Smoking status: Never   Smokeless tobacco: Never  Vaping Use   Vaping Use: Never used  Substance and Sexual Activity   Alcohol use: No    Alcohol/week: 0.0 standard drinks   Drug use: No   Sexual activity: Yes    Partners: Male    Birth control/protection: None  Other Topics Concern   Not on file  Social History Narrative   Not on file   Social Determinants of Health   Financial Resource Strain: Not on file  Food Insecurity: Not on file  Transportation  Needs: Not on file  Physical Activity: Not on file  Stress: Not on file  Social Connections: Not on file  Intimate Partner Violence: Not on file    Review of Systems: See HPI, otherwise negative ROS  Physical Exam: Vital signs in last 24 hours: Temp:  [98.2 F (36.8 C)] 98.2 F (36.8 C) (11/04 0714) Pulse Rate:  [74] 74 (11/04 0714) Resp:  [18] 18 (11/04 0714) BP: (134)/(69) 134/69 (11/04 0714) SpO2:  [97 %] 97 % (11/04 0714)   General:   Alert,  Well-developed, well-nourished, pleasant and cooperative in NAD Head:  Normocephalic and atraumatic. Eyes:  Sclera clear, no icterus.   Conjunctiva pink. Ears:  Normal auditory acuity. Nose:  No deformity, discharge,  or lesions. Mouth:  No deformity or lesions, dentition normal. Neck:  Supple; no masses or thyromegaly. Lungs:  Clear throughout to auscultation.   No wheezes, crackles, or rhonchi. No acute distress. Heart:  Regular rate and rhythm; no murmurs, clicks, rubs,  or gallops. Abdomen:  Soft, nontender and nondistended. No masses, hepatosplenomegaly or hernias noted. Normal bowel sounds, without guarding, and without rebound.   Msk:  Symmetrical without gross deformities. Normal posture. Extremities:  Without clubbing or edema. Neurologic:  Alert and  oriented x4;  grossly normal neurologically. Skin:  Intact without significant lesions or rashes. Cervical Nodes:  No significant cervical adenopathy. Psych:  Alert and cooperative. Normal mood and affect.  Impression/Plan: [name] is here for an EGD with possible dilation for dysphagia, abdominal pain, GERD and a colonoscopy for surveillance. Patient with history of adenomas and family history of colon cancer in mother in her mid 64s, succumbing to the disease.   The risks of the procedure including infection, bleed, or perforation as well as benefits, limitations, alternatives and imponderables have been reviewed with the patient. Questions have been answered. All  parties agreeable.

## 2021-09-22 NOTE — Discharge Instructions (Addendum)
EGD Discharge instructions Please read the instructions outlined below and refer to this sheet in the next few weeks. These discharge instructions provide you with general information on caring for yourself after you leave the hospital. Your doctor may also give you specific instructions. While your treatment has been planned according to the most current medical practices available, unavoidable complications occasionally occur. If you have any problems or questions after discharge, please call your doctor. ACTIVITY You may resume your regular activity but move at a slower pace for the next 24 hours.  Take frequent rest periods for the next 24 hours.  Walking will help expel (get rid of) the air and reduce the bloated feeling in your abdomen.  No driving for 24 hours (because of the anesthesia (medicine) used during the test).  You may shower.  Do not sign any important legal documents or operate any machinery for 24 hours (because of the anesthesia used during the test).  NUTRITION Drink plenty of fluids.  You may resume your normal diet.  Begin with a light meal and progress to your normal diet.  Avoid alcoholic beverages for 24 hours or as instructed by your caregiver.  MEDICATIONS You may resume your normal medications unless your caregiver tells you otherwise.  WHAT YOU CAN EXPECT TODAY You may experience abdominal discomfort such as a feeling of fullness or "gas" pains.  FOLLOW-UP Your doctor will discuss the results of your test with you.  SEEK IMMEDIATE MEDICAL ATTENTION IF ANY OF THE FOLLOWING OCCUR: Excessive nausea (feeling sick to your stomach) and/or vomiting.  Severe abdominal pain and distention (swelling).  Trouble swallowing.  Temperature over 101 F (37.8 C).  Rectal bleeding or vomiting of blood.     Colonoscopy Discharge Instructions  Read the instructions outlined below and refer to this sheet in the next few weeks. These discharge instructions provide you with  general information on caring for yourself after you leave the hospital. Your doctor may also give you specific instructions. While your treatment has been planned according to the most current medical practices available, unavoidable complications occasionally occur.   ACTIVITY You may resume your regular activity, but move at a slower pace for the next 24 hours.  Take frequent rest periods for the next 24 hours.  Walking will help get rid of the air and reduce the bloated feeling in your belly (abdomen).  No driving for 24 hours (because of the medicine (anesthesia) used during the test).   Do not sign any important legal documents or operate any machinery for 24 hours (because of the anesthesia used during the test).  NUTRITION Drink plenty of fluids.  You may resume your normal diet as instructed by your doctor.  Begin with a light meal and progress to your normal diet. Heavy or fried foods are harder to digest and may make you feel sick to your stomach (nauseated).  Avoid alcoholic beverages for 24 hours or as instructed.  MEDICATIONS You may resume your normal medications unless your doctor tells you otherwise.  WHAT YOU CAN EXPECT TODAY Some feelings of bloating in the abdomen.  Passage of more gas than usual.  Spotting of blood in your stool or on the toilet paper.  IF YOU HAD POLYPS REMOVED DURING THE COLONOSCOPY: No aspirin products for 7 days or as instructed.  No alcohol for 7 days or as instructed.  Eat a soft diet for the next 24 hours.  FINDING OUT THE RESULTS OF YOUR TEST Not all test results are  available during your visit. If your test results are not back during the visit, make an appointment with your caregiver to find out the results. Do not assume everything is normal if you have not heard from your caregiver or the medical facility. It is important for you to follow up on all of your test results.  SEEK IMMEDIATE MEDICAL ATTENTION IF: You have more than a spotting of  blood in your stool.  Your belly is swollen (abdominal distention).  You are nauseated or vomiting.  You have a temperature over 101.  You have abdominal pain or discomfort that is severe or gets worse throughout the day.   Your EGD revealed mild amount inflammation in your stomach.  I took biopsies of this to rule out infection with a bacteria called H. pylori.  Await pathology results, my office will contact you.  You did have a slight narrowing of your esophagus so I stretched this with a balloon.  Hopefully this helps with your swallowing.  Continue on pantoprazole twice daily.  Your colonoscopy revealed 4 polyp(s) which I removed successfully. Await pathology results, my office will contact you. I recommend repeating colonoscopy in 5 years for surveillance purposes.   Follow up with GI in 4 months  I hope you have a great rest of your week!  Elon Alas. Abbey Chatters, D.O. Gastroenterology and Hepatology [facility]

## 2021-09-22 NOTE — Op Note (Signed)
[facility] Patient Name: [name] Procedure Date: 09/22/2021 8:22 AM MRN: [id number] Date of Birth: [birth year] Attending MD: Elon Alas. Abbey Chatters DO CSN: [id number] Age: 55 Admit Type: Outpatient Procedure:                Colonoscopy Indications:              Colon cancer screening in patient at increased                            risk: Colorectal cancer in mother, High risk colon                            cancer surveillance: Personal history of multiple                            (3 or more) adenomas Providers:                Elon Alas. Abbey Chatters, DO, Lambert Mody, Nelma Rothman, Technician Referring MD:              Medicines:                See the Anesthesia note for documentation of the                            administered medications Complications:            No immediate complications. Estimated Blood Loss:     Estimated blood loss was minimal. Procedure:                Pre-Anesthesia Assessment:                           - The anesthesia plan was to use monitored                            anesthesia care (MAC).                           After obtaining informed consent, the colonoscope                            was passed under direct vision. Throughout the                            procedure, the patient's blood pressure, pulse, and                            oxygen saturations were monitored continuously. The                            [id number]-HQ190L ([id number]) was introduced through the                            anus and advanced to the the cecum, identified by  appendiceal orifice and ileocecal valve. The                            colonoscopy was performed without difficulty. The                            patient tolerated the procedure well. The quality                            of the bowel preparation was evaluated using the                            BBPS [facility] Bowel Preparation Scale) with scores                             of: Right Colon = 2 (minor amount of residual                            staining, small fragments of stool and/or opaque                            liquid, but mucosa seen well), Transverse Colon = 3                            (entire mucosa seen well with no residual staining,                            small fragments of stool or opaque liquid) and Left                            Colon = 3 (entire mucosa seen well with no residual                            staining, small fragments of stool or opaque                            liquid). The total BBPS score equals 8. The quality                            of the bowel preparation was good. Scope In: 8:26:19 AM Scope Out: 8:43:27 AM Scope Withdrawal Time: 0 hours 14 minutes 9 seconds  Total Procedure Duration: 0 hours 17 minutes 8 seconds  Findings:      The perianal and digital rectal examinations were normal.      Non-bleeding internal hemorrhoids were found during endoscopy.      Four sessile polyps were found in the descending colon and transverse       colon. The polyps were 5 to 8 mm in size. These polyps were removed with       a cold snare. Resection and retrieval were complete.      The exam was otherwise without abnormality. Impression:               - Non-bleeding internal hemorrhoids.                           -  Four 5 to 8 mm polyps in the descending colon and                            in the transverse colon, removed with a cold snare.                            Resected and retrieved.                           - The examination was otherwise normal. Moderate Sedation:      Per Anesthesia Care Recommendation:           - Patient has a contact number available for                            emergencies. The signs and symptoms of potential                            delayed complications were discussed with the                            patient. Return to normal activities tomorrow.                             Written discharge instructions were provided to the                            patient.                           - Resume previous diet.                           - Continue present medications.                           - Await pathology results.                           - Repeat colonoscopy in 5 years for surveillance.                           - Return to GI clinic in 4 months. Procedure Code(s):        --- Professional ---                           [id number], Colonoscopy, flexible; with removal of                            tumor(s), polyp(s), or other lesion(s) by snare                            technique Diagnosis Code(s):        --- Professional ---  Z80.0, Family history of malignant neoplasm of                            digestive organs                           Z86.010, Personal history of colonic polyps                           K63.5, Polyp of colon                           K64.8, Other hemorrhoids CPT copyright 2019 American Medical Association. All rights reserved. The codes documented in this report are preliminary and upon coder review may  be revised to meet current compliance requirements. Elon Alas. Abbey Chatters, DO [place] Abbey Chatters, DO 09/22/2021 8:46:29 AM This report has been signed electronically. Number of Addenda: 0

## 2021-09-22 NOTE — Transfer of Care (Signed)
Immediate Anesthesia Transfer of Care Note  Patient: [name]  Procedure(s) Performed: COLONOSCOPY WITH PROPOFOL ESOPHAGOGASTRODUODENOSCOPY (EGD) WITH PROPOFOL BALLOON DILATION POLYPECTOMY BIOPSY  Patient Location: Short Stay  Anesthesia Type:General  Level of Consciousness: awake, alert  and oriented  Airway & Oxygen Therapy: Patient Spontanous Breathing  Post-op Assessment: Report given to RN and Post -op Vital signs reviewed and stable  Post vital signs: Reviewed and stable  Last Vitals:  Vitals Value Taken Time  BP    Temp 36.7 C 09/22/21 0849  Pulse 94 09/22/21 0849  Resp 16 09/22/21 0849  SpO2 100 % 09/22/21 0849    Last Pain:  Vitals:   09/22/21 0849  TempSrc: Axillary  PainSc: 0-No pain         Complications: No notable events documented.

## 2021-09-22 NOTE — Anesthesia Preprocedure Evaluation (Signed)
Anesthesia Evaluation  Patient identified by MRN, date of birth, ID band Patient awake    Reviewed: Allergy & Precautions, H&P , NPO status , Patient's Chart, lab work & pertinent test results, reviewed documented beta blocker date and time   Airway Mallampati: II  TM Distance: >3 FB Neck ROM: full    Dental no notable dental hx.    Pulmonary shortness of breath,    Pulmonary exam normal breath sounds clear to auscultation       Cardiovascular Exercise Tolerance: Good hypertension, negative cardio ROS   Rhythm:regular Rate:Normal     Neuro/Psych PSYCHIATRIC DISORDERS Anxiety Depression  Neuromuscular disease    GI/Hepatic Neg liver ROS, GERD  Medicated,  Endo/Other  diabetesMorbid obesity  Renal/GU negative Renal ROS  negative genitourinary   Musculoskeletal   Abdominal   Peds  Hematology  (+) Blood dyscrasia, anemia ,   Anesthesia Other Findings   Reproductive/Obstetrics negative OB ROS                             Anesthesia Physical Anesthesia Plan  ASA: 3  Anesthesia Plan: General   Post-op Pain Management:    Induction:   PONV Risk Score and Plan:   Airway Management Planned:   Additional Equipment:   Intra-op Plan:   Post-operative Plan:   Informed Consent: I have reviewed the patients History and Physical, chart, labs and discussed the procedure including the risks, benefits and alternatives for the proposed anesthesia with the patient or authorized representative who has indicated his/her understanding and acceptance.     Dental Advisory Given  Plan Discussed with: CRNA  Anesthesia Plan Comments:         Anesthesia Quick Evaluation

## 2021-09-25 LAB — SURGICAL PATHOLOGY

## 2021-09-26 ENCOUNTER — Telehealth: Payer: Self-pay | Admitting: Family Medicine

## 2021-09-26 DIAGNOSIS — Z79899 Other long term (current) drug therapy: Secondary | ICD-10-CM

## 2021-09-26 DIAGNOSIS — E1165 Type 2 diabetes mellitus with hyperglycemia: Secondary | ICD-10-CM

## 2021-09-26 DIAGNOSIS — E7800 Pure hypercholesterolemia, unspecified: Secondary | ICD-10-CM

## 2021-09-26 DIAGNOSIS — I10 Essential (primary) hypertension: Secondary | ICD-10-CM

## 2021-09-26 NOTE — Telephone Encounter (Signed)
Lipid, liver, metabolic 7, C8Q, urine ACR Patient needs to do these before her next follow-up visit at the start of the December

## 2021-09-26 NOTE — Telephone Encounter (Signed)
Blood work ordered in Epic. Patient notified. 

## 2021-09-27 ENCOUNTER — Encounter (HOSPITAL_COMMUNITY): Payer: Self-pay | Admitting: Internal Medicine

## 2021-10-07 ENCOUNTER — Other Ambulatory Visit: Payer: Self-pay | Admitting: Nurse Practitioner

## 2021-10-13 ENCOUNTER — Other Ambulatory Visit: Payer: Self-pay | Admitting: Nurse Practitioner

## 2021-10-13 DIAGNOSIS — E1165 Type 2 diabetes mellitus with hyperglycemia: Secondary | ICD-10-CM

## 2021-10-17 ENCOUNTER — Telehealth: Payer: Self-pay

## 2021-10-17 ENCOUNTER — Other Ambulatory Visit: Payer: Self-pay | Admitting: Nurse Practitioner

## 2021-10-17 DIAGNOSIS — J302 Other seasonal allergic rhinitis: Secondary | ICD-10-CM

## 2021-10-17 MED ORDER — HYDROCODONE-ACETAMINOPHEN 5-325 MG PO TABS: 1.0000 | ORAL_TABLET | Freq: Three times a day (TID) | ORAL | 0 refills | Status: DC | PRN

## 2021-10-17 NOTE — Telephone Encounter (Signed)
Calling to request refill on hydrocodone per PMP last filled 09/15/2021

## 2021-10-18 ENCOUNTER — Telehealth: Payer: Self-pay | Admitting: Family Medicine

## 2021-10-18 DIAGNOSIS — E1165 Type 2 diabetes mellitus with hyperglycemia: Secondary | ICD-10-CM

## 2021-10-18 MED ORDER — TRULICITY 0.75 MG/0.5ML ~~LOC~~ SOAJ: SUBCUTANEOUS | 0 refills | Status: DC

## 2021-10-18 NOTE — Telephone Encounter (Signed)
Patient is requesting refill on dulaglutide 075 mg  called into [facility] [place] She has appointment on 10/23/2021

## 2021-10-19 ENCOUNTER — Ambulatory Visit: Payer: 59 | Admitting: Family Medicine

## 2021-10-20 ENCOUNTER — Other Ambulatory Visit: Payer: Self-pay

## 2021-10-20 ENCOUNTER — Encounter: Payer: 59 | Attending: Physical Medicine and Rehabilitation | Admitting: Physical Medicine and Rehabilitation

## 2021-10-20 ENCOUNTER — Encounter: Payer: Self-pay | Admitting: Physical Medicine and Rehabilitation

## 2021-10-20 VITALS — BP 114/77 | HR 93 | Temp 98.2°F | Wt 237.4 lb

## 2021-10-20 DIAGNOSIS — Z79891 Long term (current) use of opiate analgesic: Secondary | ICD-10-CM | POA: Insufficient documentation

## 2021-10-20 DIAGNOSIS — M5417 Radiculopathy, lumbosacral region: Secondary | ICD-10-CM | POA: Diagnosis not present

## 2021-10-20 DIAGNOSIS — G894 Chronic pain syndrome: Secondary | ICD-10-CM | POA: Insufficient documentation

## 2021-10-20 DIAGNOSIS — Z5181 Encounter for therapeutic drug level monitoring: Secondary | ICD-10-CM | POA: Insufficient documentation

## 2021-10-20 LAB — BASIC METABOLIC PANEL
BUN/Creatinine Ratio: 10 (ref 9–23)
BUN: 9 mg/dL (ref 6–24)
CO2: 26 mmol/L (ref 20–29)
Calcium: 8.8 mg/dL (ref 8.7–10.2)
Chloride: 101 mmol/L (ref 96–106)
Creatinine, Ser: 0.89 mg/dL (ref 0.57–1.00)
Glucose: 113 mg/dL — ABNORMAL HIGH (ref 70–99)
Potassium: 4.2 mmol/L (ref 3.5–5.2)
Sodium: 142 mmol/L (ref 134–144)
eGFR: 77 mL/min/{1.73_m2} (ref >59)

## 2021-10-20 LAB — HEPATIC FUNCTION PANEL
ALT: 20 IU/L (ref 0–32)
AST: 15 IU/L (ref 0–40)
Albumin: 4.4 g/dL (ref 3.8–4.9)
Alkaline Phosphatase: 76 IU/L (ref 44–121)
Bilirubin Total: 0.3 mg/dL (ref 0.0–1.2)
Bilirubin, Direct: <0.1 mg/dL (ref 0.00–0.40)
Total Protein: 6.9 g/dL (ref 6.0–8.5)

## 2021-10-20 LAB — MICROALBUMIN / CREATININE URINE RATIO
Creatinine, Urine: 99.7 mg/dL
Microalb/Creat Ratio: 7 mg/g creat (ref 0–29)
Microalbumin, Urine: 6.8 ug/mL

## 2021-10-20 LAB — LIPID PANEL
Chol/HDL Ratio: 3.6 ratio (ref 0.0–4.4)
Cholesterol, Total: 158 mg/dL (ref 100–199)
HDL: 44 mg/dL (ref >39)
LDL Chol Calc (NIH): 88 mg/dL (ref 0–99)
Triglycerides: 147 mg/dL (ref 0–149)
VLDL Cholesterol Cal: 26 mg/dL (ref 5–40)

## 2021-10-20 LAB — HEMOGLOBIN A1C
Est. average glucose Bld gHb Est-mCnc: 128 mg/dL
Hgb A1c MFr Bld: 6.1 % — ABNORMAL HIGH (ref 4.8–5.6)

## 2021-10-20 NOTE — Patient Instructions (Signed)
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  Also hx of lumbar surgery- been on Norco for "years".    Here for f/u on chronic pain.  Will not increase Cymbalta- because on Celexa 40 mg daily- and don't want increase cymbalta because would have to reduce dose of Celexa.   2. Completely off Lyrica.    3. Increase Norco to 4 tabs/day won't change current Rx, since just prescribed, but will increase NEXT Rx and expect her to run out 1 week early due to increasing dose to 4 pills/day from 3 pills/day   4. Talk with Ortho about L and R trigger fingers- can get injected.   5. Also talk with Ortho about R knee steroid injection.   6. F/U in 3 months on pain meds.

## 2021-10-20 NOTE — Progress Notes (Signed)
Subjective:    Patient ID: [name], female    DOB: 04-21-1966, 55 y.o.   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  Also hx of lumbar surgery- been on Norco for "years".    Here for f/u on chronic pain.  Not a lot of nausea with Cymbalta- taking 30 mg daily.  Mood- a little better- has good and bad days.    L shoulder- humeral fx- is "growing back"- growing "too much"- having to do exercises to allow her to have more ROM in L shoulder.   R knee pain- has had injection into R knee- started hurting- in last week.    Working up til dark- working so many hours- doesn't get home til 7pm- sometimes doesn't even eat.  Does things on weekend.  Hurting after work.   Just got pain meds refilled 10/17/21.       Pain Inventory Average Pain 8 Pain Right Now 8 My pain is sharp, stabbing, and aching  In the last 24 hours, has pain interfered with the following? General activity 7 Relation with others 7 Enjoyment of life 4 What TIME of day is your pain at its worst? daytime Sleep (in general) Poor  Pain is worse with: walking, bending, sitting, standing, and some activites Pain improves with: medication Relief from Meds: 6  Family History  Problem Relation Age of Onset   Colon cancer Mother        mid-50s, died of metastatic disease   Cancer Mother        liver   Coronary artery disease Mother    Diabetes type I Mother    Peripheral vascular disease Mother        Carotid disease in her 76s   Coronary artery disease Father        CAD in his 36s   Diabetes Father    Diabetes type I Father    Hypertension Father    Bipolar disorder Sister    Coronary artery disease Brother        MI in his 14s   Hypertension Brother    Hypertension Maternal Grandmother    Hyperlipidemia Maternal Grandmother    Stroke Maternal  Grandmother    Hypertension Maternal Grandfather    Diabetes Maternal Grandfather    Diabetes Paternal Grandmother    Heart disease Paternal Grandfather    Social History   Socioeconomic History   Marital status: Married    Spouse name: Not on file   Number of children: 3   Years of education: Not on file   Highest education level: Not on file  Occupational History   Occupation: child care    Employer: [employer]  Tobacco Use   Smoking status: Never   Smokeless tobacco: Never  Vaping Use   Vaping Use: Never used  Substance and Sexual Activity   Alcohol use: No    Alcohol/week: 0.0 standard drinks   Drug use: No   Sexual activity: Yes    Partners: Male    Birth control/protection: None  Other Topics Concern   Not on file  Social History Narrative   Not on file   Social Determinants of Health   Financial Resource Strain: Not on file  Food Insecurity: Not on file  Transportation Needs: Not on file  Physical Activity: Not on file  Stress: Not on file  Social Connections: Not on file   Past Surgical History:  Procedure Laterality Date   back surg x2     BALLOON DILATION N/A 09/22/2021   Procedure: BALLOON DILATION;  Surgeon: Eloise Harman, DO;  [facility];  Service: Endoscopy;  Laterality: N/A;   BIOPSY  09/22/2021   Procedure: BIOPSY;  Surgeon: Eloise Harman, DO;  [facility];  Service: Endoscopy;;   BREAST LUMPECTOMY Right 2012   CHOLECYSTECTOMY     COLONOSCOPY  11/03/07   4-mm sessile polyp removed/small internal hemorrhoids/tubular adenoma, random colon bx negative for microscopic colitis   COLONOSCOPY WITH PROPOFOL N/A 09/17/2016   Grade 2 hemorrhoids, colonic diverticulosis, ascending colon polyp (sessile serrated adenoma), 5 year surveillance   COLONOSCOPY WITH PROPOFOL N/A 09/22/2021   Procedure: COLONOSCOPY WITH PROPOFOL;  Surgeon: Eloise Harman, DO;  [facility];  Service: Endoscopy;  Laterality: N/A;   7:30am   COLONOSCOPY, ESOPHAGOGASTRODUODENOSCOPY (EGD) AND ESOPHAGEAL DILATION  01/2012   mild gastritis s/p biopsy. Empiric dilation. Normal duodenum.    DILATATION & CURETTAGE/HYSTEROSCOPY WITH MYOSURE N/A 02/10/2015   Procedure: DILATATION & CURETTAGE/HYSTEROSCOPY WITH MYOSURE;  Surgeon: Nunzio Cobbs, MD;  Location: [place] [facility];  Service: Gynecology;  Laterality: N/A;   ESOPHAGOGASTRODUODENOSCOPY (EGD) WITH PROPOFOL N/A 01/11/2020   Normal esophagus, s/p dilation, normal stomach, normal duodenum.    ESOPHAGOGASTRODUODENOSCOPY (EGD) WITH PROPOFOL N/A 09/22/2021   Procedure: ESOPHAGOGASTRODUODENOSCOPY (EGD) WITH PROPOFOL;  Surgeon: Eloise Harman, DO;  [facility];  Service: Endoscopy;  Laterality: N/A;   MALONEY DILATION N/A 01/11/2020   Procedure: Venia Minks DILATION;  Surgeon: Daneil Dolin, MD;  [facility];  Service: Endoscopy;  Laterality: N/A;   MM BREAST STEREO BX*L*R/S     rt.   POLYPECTOMY  09/17/2016   Procedure: POLYPECTOMY;  Surgeon: Daneil Dolin, MD;  [facility];  Service: Endoscopy;;  ascending colon   POLYPECTOMY  09/22/2021   Procedure: POLYPECTOMY;  Surgeon: Eloise Harman, DO;  [facility];  Service: Endoscopy;;   PORT-A-CATH REMOVAL  05/26/2012   Procedure: REMOVAL PORT-A-CATH;  Surgeon: Jamesetta So, MD;  Location: [facility];  Service: General;  Laterality: N/A;  Minor Room   PORTACATH PLACEMENT     Past Surgical History:  Procedure Laterality Date   back surg x2     BALLOON DILATION N/A 09/22/2021   Procedure: BALLOON DILATION;  Surgeon: Eloise Harman, DO;  [facility];  Service: Endoscopy;  Laterality: N/A;   BIOPSY  09/22/2021   Procedure: BIOPSY;  Surgeon: Eloise Harman, DO;  [facility];  Service: Endoscopy;;   BREAST LUMPECTOMY Right 2012   CHOLECYSTECTOMY     COLONOSCOPY  11/03/07   4-mm sessile polyp removed/small internal hemorrhoids/tubular adenoma, random colon bx  negative for microscopic colitis   COLONOSCOPY WITH PROPOFOL N/A 09/17/2016   Grade 2 hemorrhoids, colonic diverticulosis, ascending colon polyp (sessile serrated adenoma), 5 year surveillance   COLONOSCOPY WITH PROPOFOL N/A 09/22/2021   Procedure: COLONOSCOPY WITH PROPOFOL;  Surgeon: Eloise Harman, DO;  [facility];  Service: Endoscopy;  Laterality: N/A;  7:30am   COLONOSCOPY, ESOPHAGOGASTRODUODENOSCOPY (EGD) AND ESOPHAGEAL DILATION  01/2012   mild gastritis s/p biopsy. Empiric dilation. Normal duodenum.    DILATATION & CURETTAGE/HYSTEROSCOPY WITH MYOSURE N/A 02/10/2015   Procedure: DILATATION & CURETTAGE/HYSTEROSCOPY WITH MYOSURE;  Surgeon: Jamey Reas  Lorenz Coaster, MD;  Location: [facility];  Service: Gynecology;  Laterality: N/A;   ESOPHAGOGASTRODUODENOSCOPY (EGD) WITH PROPOFOL N/A 01/11/2020   Normal esophagus, s/p dilation, normal stomach, normal duodenum.    ESOPHAGOGASTRODUODENOSCOPY (EGD) WITH PROPOFOL N/A 09/22/2021   Procedure: ESOPHAGOGASTRODUODENOSCOPY (EGD) WITH PROPOFOL;  Surgeon: Eloise Harman, DO;  [facility];  Service: Endoscopy;  Laterality: N/A;   MALONEY DILATION N/A 01/11/2020   Procedure: Venia Minks DILATION;  Surgeon: Daneil Dolin, MD;  [facility];  Service: Endoscopy;  Laterality: N/A;   MM BREAST STEREO BX*L*R/S     rt.   POLYPECTOMY  09/17/2016   Procedure: POLYPECTOMY;  Surgeon: Daneil Dolin, MD;  [facility];  Service: Endoscopy;;  ascending colon   POLYPECTOMY  09/22/2021   Procedure: POLYPECTOMY;  Surgeon: Eloise Harman, DO;  [facility];  Service: Endoscopy;;   PORT-A-CATH REMOVAL  05/26/2012   Procedure: REMOVAL PORT-A-CATH;  Surgeon: Jamesetta So, MD;  Location: [facility];  Service: General;  Laterality: N/A;  Minor Room   PORTACATH PLACEMENT     Past Medical History:  Diagnosis Date   Anxiety    Blood transfusion without reported diagnosis 1999   due to heavy menses   Breast cancer ([place])  05/22/2011   03/01/11, Stage 2, s/p lumpectomy, chemo/xrt   Complication of anesthesia    pt woke up during procedure   DDD (degenerative disc disease), lumbar    Depression 06/22/2011   Diverticulitis    DM (diabetes mellitus) ([place]) 06/22/2011   Genital warts    GERD (gastroesophageal reflux disease) 06/22/2011   Hx of adenomatous colonic polyps 10/2007   55mm sigmoid tubular adenoma, FH colon cancer, mother in mid-50s   Hypercholesterolemia 06/22/2011   Hypertension 06/22/2011   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[place]) 05/22/2011   Iron deficiency anemia 03/25/2015   Morbid obesity ([place])    Shortness of breath    STD (sexually transmitted disease)    HPV, 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   Vaginal bleeding 03/08/2014   BP 114/77   Pulse 93   Temp 98.2 F (36.8 C)   Wt 237 lb 6.4 oz (107.7 kg)   LMP 04/05/2016 Comment: spotting 11/2016 and 12/2016  SpO2 97%   BMI 40.75 kg/m   Opioid Risk Score:   Fall Risk Score:  `1  Depression screen PHQ 2/9  Depression screen [facility] 2/9 10/20/2021 07/14/2021 05/05/2021 04/14/2021 10/04/2020  Decreased Interest 3 1 3 2 1   Down, Depressed, Hopeless 3 1 3 2 1   PHQ - 2 Score 6 2 6 4 2   Altered sleeping - - 1 2 1   Tired, decreased energy - - 3 2 2   Change in appetite - - 1 1 0  Feeling bad or failure about yourself  - - 1 1 1   Trouble concentrating - - 2 3 1   Moving slowly or fidgety/restless - - 1 3 2   Suicidal thoughts - - 1 0 0  PHQ-9 Score - - 16 16 9   Difficult doing work/chores - - - Extremely dIfficult Somewhat difficult  Some recent data might be hidden    Review of Systems  Constitutional: Negative.   HENT: Negative.    Eyes: Negative.   Respiratory: Negative.    Cardiovascular: Negative.   Gastrointestinal: Negative.   Endocrine: Negative.   Genitourinary: Negative.   Musculoskeletal:  Positive for back pain, gait problem and neck pain.  Skin: Negative.   Allergic/Immunologic: Negative.  Hematological: Negative.    Psychiatric/Behavioral:  Positive for dysphoric mood.   All other systems reviewed and are negative.     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  Also hx of lumbar surgery- been on Norco for "years".    Here for f/u on chronic pain.  Will not increase Cymbalta- because on Celexa 40 mg daily- and don't want increase cymbalta because would have to reduce dose of Celexa.   2. Completely off Lyrica.    3. Increase Norco to 4 tabs/day won't change current Rx, since just prescribed, but will increase NEXT Rx and expect her to run out 1 week early due to increasing dose to 4 pills/day from 3 pills/day   4. Talk with Ortho about L and R trigger fingers- can get injected.   5. Also talk with Ortho about R knee steroid injection.   6. F/U in 3 months on pain meds.   I spent a total of 21 minutes on visit- as discussed above- went over change for pain meds.

## 2021-10-23 ENCOUNTER — Other Ambulatory Visit: Payer: Self-pay

## 2021-10-23 ENCOUNTER — Ambulatory Visit (INDEPENDENT_AMBULATORY_CARE_PROVIDER_SITE_OTHER): Payer: 59 | Admitting: Family Medicine

## 2021-10-23 ENCOUNTER — Encounter: Payer: Self-pay | Admitting: Family Medicine

## 2021-10-23 VITALS — BP 123/82 | HR 91 | Temp 97.3°F | Ht 64.0 in | Wt 239.2 lb

## 2021-10-23 DIAGNOSIS — Z23 Encounter for immunization: Secondary | ICD-10-CM | POA: Diagnosis not present

## 2021-10-23 DIAGNOSIS — I10 Essential (primary) hypertension: Secondary | ICD-10-CM

## 2021-10-23 DIAGNOSIS — K529 Noninfective gastroenteritis and colitis, unspecified: Secondary | ICD-10-CM

## 2021-10-23 DIAGNOSIS — M65332 Trigger finger, left middle finger: Secondary | ICD-10-CM | POA: Diagnosis not present

## 2021-10-23 DIAGNOSIS — E782 Mixed hyperlipidemia: Secondary | ICD-10-CM

## 2021-10-23 DIAGNOSIS — E119 Type 2 diabetes mellitus without complications: Secondary | ICD-10-CM

## 2021-10-23 MED ORDER — ONDANSETRON 8 MG PO TBDP: 8.0000 mg | ORAL_TABLET | Freq: Three times a day (TID) | ORAL | 0 refills | Status: DC | PRN

## 2021-10-23 MED ORDER — DIPHENOXYLATE-ATROPINE 2.5-0.025 MG PO TABS: 1.0000 | ORAL_TABLET | Freq: Four times a day (QID) | ORAL | 0 refills | Status: DC | PRN

## 2021-10-23 NOTE — Patient Instructions (Signed)
Medication as directed.  Flu shot today.  Be sure to get an eye exam.  Follow up in 6 months.  Take care  Dr. Lacinda Axon

## 2021-10-24 ENCOUNTER — Encounter: Payer: Self-pay | Admitting: Orthopaedic Surgery

## 2021-10-24 ENCOUNTER — Ambulatory Visit (INDEPENDENT_AMBULATORY_CARE_PROVIDER_SITE_OTHER): Payer: 59 | Admitting: Orthopaedic Surgery

## 2021-10-24 ENCOUNTER — Ambulatory Visit: Payer: 59

## 2021-10-24 VITALS — BP 128/78 | HR 107 | Ht 64.0 in | Wt 242.0 lb

## 2021-10-24 DIAGNOSIS — S42255D Nondisplaced fracture of greater tuberosity of left humerus, subsequent encounter for fracture with routine healing: Secondary | ICD-10-CM | POA: Diagnosis not present

## 2021-10-24 DIAGNOSIS — M65331 Trigger finger, right middle finger: Secondary | ICD-10-CM

## 2021-10-24 DIAGNOSIS — M6530 Trigger finger, unspecified finger: Secondary | ICD-10-CM | POA: Insufficient documentation

## 2021-10-24 DIAGNOSIS — M65332 Trigger finger, left middle finger: Secondary | ICD-10-CM | POA: Diagnosis not present

## 2021-10-24 DIAGNOSIS — K529 Noninfective gastroenteritis and colitis, unspecified: Secondary | ICD-10-CM | POA: Insufficient documentation

## 2021-10-24 NOTE — Assessment & Plan Note (Signed)
At goal. A1C 6.1. Continue Metformin and Trulicity.

## 2021-10-24 NOTE — Assessment & Plan Note (Signed)
Advised supportive care. If persists, can refer to Ortho.

## 2021-10-24 NOTE — Assessment & Plan Note (Signed)
Treating with Zofran 8 mg ODT and Lomotil.

## 2021-10-24 NOTE — Assessment & Plan Note (Signed)
Stable on Lisinopril. Continue.

## 2021-10-24 NOTE — Progress Notes (Signed)
Subjective:  Patient ID: [name], female    DOB: 02-Apr-1966  Age: 55 y.o. MRN: [id number]  CC: Chief Complaint  Patient presents with   Medication Refill    Pt here for med refills. Pt states she has had diarrhea since Saturday; seasonal anxiety getting worse per patient; middle fingers getting stuck     HPI:  55 year old female presents for follow-up.  Patient reports that on Sunday morning she developed nausea and diarrhea.  Symptoms have continued to persist.  She has used Zofran without resolution.  No abdominal pain.  No fever.  Patient concerned about her symptoms.  Patient would like her flu vaccine today.  Patient also notes that her middle fingers tend to get stuck when she flexes them.  She has to manually move them back into normal position.  She has had recent laboratory studies.  Her diabetes is well controlled.  A1c 6.1.  She is compliant with metformin and Trulicity.  No reported adverse side effects.  Blood pressure is well controlled on lisinopril.  Lipids have been fairly well controlled on Lipitor. Needs lipid panel.   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) 05/22/2011    Social Hx   Social History   Socioeconomic History   Marital status: Married     Spouse name: Not on file   Number of children: 3   Years of education: Not on file   Highest education level: Not on file  Occupational History   Occupation: child care    Employer: [employer]  Tobacco Use   Smoking status: Never   Smokeless tobacco: Never  Vaping Use   Vaping Use: Never used  Substance and Sexual Activity   Alcohol use: No    Alcohol/week: 0.0 standard drinks   Drug use: No   Sexual activity: Yes    Partners: Male    Birth control/protection: None  Other Topics Concern   Not on file  Social History Narrative   Not on file   Social Determinants of Health   Financial Resource Strain: Not on file  Food Insecurity: Not on file  Transportation Needs: Not on file  Physical Activity: Not on file  Stress: Not on file  Social Connections: Not on file    Review of Systems Per HPI  Objective:  BP 123/82   Pulse 91   Temp (!) 97.3 F (36.3 C)   Ht 5\' 4"  (1.626 m)   Wt 239 lb 3.2 oz (108.5 kg)   LMP 04/05/2016 Comment: spotting 11/2016 and 12/2016  SpO2 95%   BMI 41.06 kg/m   BP/Weight 10/23/2021 10/20/2021 12/24/4268  Systolic BP 623 762 831  Diastolic BP 82 77 73  Wt. (Lbs) 239.2 237.4 -  BMI 41.06 40.75 -    Physical Exam Vitals and nursing note reviewed.  Constitutional:  General: She is not in acute distress.    Appearance: Normal appearance. She is obese. She is not ill-appearing.  HENT:     Head: Normocephalic and atraumatic.  Eyes:     General:        Right eye: No discharge.        Left eye: No discharge.     Conjunctiva/sclera: Conjunctivae normal.  Cardiovascular:     Rate and Rhythm: Normal rate and regular rhythm.  Pulmonary:     Effort: Pulmonary effort is normal.     Breath sounds: Normal breath sounds. No wheezing, rhonchi or rales.  Abdominal:     General: There is no distension.     Palpations: Abdomen is soft.     Tenderness: There is no abdominal tenderness.  Neurological:     Mental Status: She is alert.   Psychiatric:        Mood and Affect: Mood normal.        Behavior: Behavior normal.    Lab Results  Component Value Date   WBC 6.1 04/27/2021   HGB 12.9 04/27/2021   HCT 41.7 04/27/2021   PLT 218 04/27/2021   GLUCOSE 113 (H) 10/19/2021   CHOL 158 10/19/2021   TRIG 147 10/19/2021   HDL 44 10/19/2021   LDLCALC 88 10/19/2021   ALT 20 10/19/2021   AST 15 10/19/2021   NA 142 10/19/2021   K 4.2 10/19/2021   CL 101 10/19/2021   CREATININE 0.89 10/19/2021   BUN 9 10/19/2021   CO2 26 10/19/2021   TSH 2.810 12/14/2017   HGBA1C 6.1 (H) 10/19/2021   MICROALBUR 1.2 10/20/2018     Assessment & Plan:   Problem List Items Addressed This Visit       Cardiovascular and Mediastinum   Essential hypertension    Stable on Lisinopril. Continue.        Digestive   Gastroenteritis - Primary    Treating with Zofran 8 mg ODT and Lomotil.         Endocrine   Diabetes mellitus without complication ([place])    At goal. A1C 6.1. Continue Metformin and Trulicity.        Musculoskeletal and Integument   Trigger finger    Advised supportive care. If persists, can refer to Ortho.        Other   Hyperlipidemia    At goal currently. Continue Lipitor.      Other Visit Diagnoses     Need for vaccination       Relevant Orders   Flu Vaccine QUAD 73mo+IM (Fluarix, Fluzone & Alfiuria Quad PF) (Completed)       Meds ordered this encounter  Medications   ondansetron (ZOFRAN-ODT) 8 MG disintegrating tablet    Sig: Take 1 tablet (8 mg total) by mouth every 8 (eight) hours as needed for nausea or vomiting.    Dispense:  20 tablet    Refill:  0   diphenoxylate-atropine (LOMOTIL) 2.5-0.025 MG tablet    Sig: Take 1 tablet by mouth 4 (four) times daily as needed for diarrhea or loose stools.    Dispense:  30 tablet    Refill:  0    Follow-up:  Return in about 6 months (around 04/23/2022) for Follow up Chronic medical issues.  [place]

## 2021-10-24 NOTE — Assessment & Plan Note (Signed)
At goal currently. Continue Lipitor.

## 2021-10-24 NOTE — Progress Notes (Signed)
She has developed trigger fingers of the left long and right long.  It pops, sticks in place, is getting worse.  Left is worse than the right.  She has no trauma, no swelling, no redness.  Her left shoulder is doing well.  X-rays were done of the left shoulder, reported separately.  She has triggering of the left and right long fingers at the A1 pulley area.  NV intact.  Encounter Diagnoses  Name Primary?   Closed nondisplaced fracture of greater tuberosity of left humerus with routine healing, subsequent encounter Yes   Trigger middle finger of left hand    Trigger finger, right middle finger    PROCEDURE  Trigger Finger Injection  The right Middle finger  has been locking at the A1 pulley.  The patient has been told about injection of the digit.  Surgical correction and excision of the A1 pulley will resolve the problem.  Ani injection in the digit should help but the results may be short lived.  The patient asked appropriate questions and understands the procedure.  The patient has elected for an injection at this time.  Verbal consent was obtained.  A timeout was taken to confirm the proper hand and digit.  Medication  1 mL of DepoMedrol 40mg   2 mL of 1% lidocaine plain  Ethyl chloride for anesthesia  Alcohol was used to prepare the skin along with ethyl chloride and then the injection was made at the A1 pulley there were no complications.  It was tolerated well.  A Band-aid dressing was applied.  Call if any problem or difficulty.   PROCEDURE  Trigger Finger Injection  The left Middle finger  has been locking at the A1 pulley.  The patient has been told about injection of the digit.  Surgical correction and excision of the A1 pulley will resolve the problem.  Ani injection in the digit should help but the results may be short lived.  The patient asked appropriate questions and understands the procedure.  The patient has elected for an injection at this time.  Verbal consent  was obtained.  A timeout was taken to confirm the proper hand and digit.  Medication  1 mL of DepoMedrol 40mg   2 mL of 1% lidocaine plain  Ethyl chloride for anesthesia  Alcohol was used to prepare the skin along with ethyl chloride and then the injection was made at the A1 pulley there were no complications.  It was tolerated well.  A Band-aid dressing was applied.  Call if any problem or difficulty.   I told her surgery is the definitive treatment of the fingers.  ROM of the left shoulder is good.  NV intact.  I will see as needed.  Call if any problem.  Precautions discussed.  Electronically Signed Sanjuana Kava, MD 12/6/20222:28 PM

## 2021-10-28 LAB — TOXASSURE SELECT,+ANTIDEPR,UR

## 2021-10-31 ENCOUNTER — Telehealth: Payer: Self-pay | Admitting: *Deleted

## 2021-10-31 NOTE — Telephone Encounter (Signed)
Urine drug screen for this encounter is consistent for prescribed medication 

## 2021-11-07 ENCOUNTER — Other Ambulatory Visit (INDEPENDENT_AMBULATORY_CARE_PROVIDER_SITE_OTHER): Payer: 59 | Admitting: *Deleted

## 2021-11-07 DIAGNOSIS — Z23 Encounter for immunization: Secondary | ICD-10-CM | POA: Diagnosis not present

## 2021-11-08 ENCOUNTER — Ambulatory Visit: Payer: 59 | Admitting: Obstetrics and Gynecology

## 2021-11-08 ENCOUNTER — Other Ambulatory Visit: Payer: Self-pay | Admitting: Nurse Practitioner

## 2021-11-08 ENCOUNTER — Other Ambulatory Visit: Payer: Self-pay | Admitting: Family Medicine

## 2021-11-08 ENCOUNTER — Other Ambulatory Visit: Payer: Self-pay | Admitting: Physical Medicine and Rehabilitation

## 2021-11-08 DIAGNOSIS — E1165 Type 2 diabetes mellitus with hyperglycemia: Secondary | ICD-10-CM

## 2021-11-15 ENCOUNTER — Telehealth: Payer: Self-pay

## 2021-11-15 NOTE — Telephone Encounter (Signed)
Patient called to request a refill on Hydrocodone sent to [facility] in [place]. Per PMP, last fill 10/17/21.

## 2021-11-16 MED ORDER — HYDROCODONE-ACETAMINOPHEN 5-325 MG PO TABS: 1.0000 | ORAL_TABLET | Freq: Three times a day (TID) | ORAL | 0 refills | Status: DC | PRN

## 2021-11-16 NOTE — Addendum Note (Signed)
Addended by: Courtney Heys on: 11/16/2021 11:34 AM   Modules accepted: Orders

## 2021-11-16 NOTE — Telephone Encounter (Signed)
Patient notified of prescription sent to the pharmacy

## 2021-11-22 ENCOUNTER — Ambulatory Visit: Payer: 59 | Admitting: Obstetrics and Gynecology

## 2021-12-09 ENCOUNTER — Other Ambulatory Visit: Payer: Self-pay | Admitting: Family Medicine

## 2021-12-09 ENCOUNTER — Other Ambulatory Visit: Payer: Self-pay | Admitting: Nurse Practitioner

## 2021-12-09 DIAGNOSIS — F324 Major depressive disorder, single episode, in partial remission: Secondary | ICD-10-CM

## 2021-12-09 DIAGNOSIS — J302 Other seasonal allergic rhinitis: Secondary | ICD-10-CM

## 2021-12-09 DIAGNOSIS — E1165 Type 2 diabetes mellitus with hyperglycemia: Secondary | ICD-10-CM

## 2021-12-14 ENCOUNTER — Telehealth: Payer: Self-pay | Admitting: Family Medicine

## 2021-12-14 NOTE — Telephone Encounter (Signed)
Pt stated that [facility] advised her that her Trulicity shots required authorization.   [phone]

## 2021-12-14 NOTE — Telephone Encounter (Signed)
PA completed yesterday 12/13/20; could take 1-5 days for insurance determination. Left message on pt voicemail

## 2021-12-19 ENCOUNTER — Telehealth: Payer: Self-pay

## 2021-12-19 NOTE — Telephone Encounter (Signed)
°  Patient requesting Hydrocodone 5-325 MG refill.    11/16/2021  Hydrocodone-Acetamin 5-325 Mg 90.00 [address] [id number] [place]  10/23/2021  Diphenoxylate-Atrop 2.5-0.025 28.00 [address] [id number] [place]

## 2021-12-20 MED ORDER — HYDROCODONE-ACETAMINOPHEN 5-325 MG PO TABS: 1.0000 | ORAL_TABLET | Freq: Three times a day (TID) | ORAL | 0 refills | Status: DC | PRN

## 2021-12-20 NOTE — Telephone Encounter (Signed)
Notified of refill. 

## 2021-12-21 ENCOUNTER — Telehealth: Payer: Self-pay | Admitting: *Deleted

## 2021-12-21 ENCOUNTER — Encounter: Payer: Self-pay | Admitting: *Deleted

## 2021-12-21 NOTE — Telephone Encounter (Signed)
[employer] [facility] approved Trulicity 0.75mg /0.80ml. Approval good 12/13/21-12/13/22

## 2021-12-21 NOTE — Telephone Encounter (Signed)
Patient notified

## 2021-12-21 NOTE — Telephone Encounter (Signed)
Prior auth submitted to [employer].

## 2021-12-21 NOTE — Telephone Encounter (Addendum)
PA Case: [id number], Status: Approved, Coverage Starts on: 12/21/2021 12:00 AM, Coverage Ends on: 06/20/2022 12:00 AM. Questions? Contact [phone]. Pharmacy and Ms Augenstein notified.

## 2021-12-22 ENCOUNTER — Ambulatory Visit: Payer: Self-pay | Admitting: Obstetrics and Gynecology

## 2022-01-04 ENCOUNTER — Encounter: Payer: Self-pay | Admitting: Obstetrics and Gynecology

## 2022-01-04 ENCOUNTER — Other Ambulatory Visit (HOSPITAL_COMMUNITY)
Admission: RE | Admit: 2022-01-04 | Discharge: 2022-01-04 | Disposition: A | Discharge status: Home / Self Care | Payer: 59 | Source: Ambulatory Visit | Attending: Obstetrics and Gynecology | Admitting: Obstetrics and Gynecology

## 2022-01-04 ENCOUNTER — Other Ambulatory Visit: Payer: Self-pay

## 2022-01-04 ENCOUNTER — Ambulatory Visit (INDEPENDENT_AMBULATORY_CARE_PROVIDER_SITE_OTHER): Payer: 59 | Admitting: Obstetrics and Gynecology

## 2022-01-04 VITALS — BP 140/80 | HR 97 | Ht 63.0 in | Wt 235.0 lb

## 2022-01-04 DIAGNOSIS — N898 Other specified noninflammatory disorders of vagina: Secondary | ICD-10-CM | POA: Insufficient documentation

## 2022-01-04 DIAGNOSIS — Z124 Encounter for screening for malignant neoplasm of cervix: Secondary | ICD-10-CM | POA: Insufficient documentation

## 2022-01-04 DIAGNOSIS — Z01419 Encounter for gynecological examination (general) (routine) without abnormal findings: Secondary | ICD-10-CM | POA: Diagnosis not present

## 2022-01-04 DIAGNOSIS — N951 Menopausal and female climacteric states: Secondary | ICD-10-CM | POA: Diagnosis not present

## 2022-01-04 NOTE — Patient Instructions (Signed)

## 2022-01-04 NOTE — Progress Notes (Signed)
56 y.o. [id number] Married Caucasian female here for annual exam.   Husband is present for the visit today.   Patient complaining of hot flashes.  Some vaginal discharge.  Sometimes itching and odor.   Feels some cramping like she would start a period.   Dealing with arthritis and degenerative joint disease. Taking hydrocodone Cymbalta to control pain.   PCP:  Thersa Salt, DO   Patient's last menstrual period was 04/05/2016.           Sexually active: Yes.    The current method of family planning is post menopausal status.    Exercising: No.  The patient does not participate in regular exercise at present. 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  Repeat pap was normal.  1990's had colposcopy with LEEP procedure to cervix. Had laser to condyloma in 1989 or 1990. MMG:  04-27-21 Neg/BiRads1 Colonoscopy:  09-22-21 polyps removed;next 5 year BMD:   n/a  Result  n/a TDaP:  2016 Gardasil:   n/a HIV: 01-18-17 NR Hep C: 01-18-17 Neg Screening Labs:  PCP   reports that she has never smoked. She has never used smokeless tobacco. She reports that she does not drink alcohol and does not use drugs.  Past Medical History:  Diagnosis Date   Anxiety    Arthritis    Blood transfusion without reported diagnosis 1999   due to heavy menses   Breast cancer ([place]) 05/22/2011   03/01/11, Stage 2, s/p lumpectomy, chemo/xrt   Complication of anesthesia    pt woke up during procedure   DDD (degenerative disc disease), lumbar    Depression 06/22/2011   Diverticulitis    DM (diabetes mellitus) ([place]) 06/22/2011   Genital warts    GERD (gastroesophageal reflux disease) 06/22/2011   Hx of adenomatous colonic polyps 10/2007   6mm sigmoid tubular adenoma, FH colon  cancer, mother in mid-50s   Hypercholesterolemia 06/22/2011   Hypertension 06/22/2011   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[place]) 05/22/2011   Iron deficiency anemia 03/25/2015   Morbid obesity ([place])    Shortness of breath    STD (sexually transmitted disease)    HPV, 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   Vaginal bleeding 03/08/2014    Past Surgical History:  Procedure Laterality Date   back surg x2     BALLOON DILATION N/A 09/22/2021   Procedure: BALLOON DILATION;  Surgeon: Eloise Harman, DO;  [facility];  Service: Endoscopy;  Laterality: N/A;   BIOPSY  09/22/2021   Procedure: BIOPSY;  Surgeon: Eloise Harman, DO;  [facility];  Service: Endoscopy;;   BREAST LUMPECTOMY Right 2012   CHOLECYSTECTOMY     COLONOSCOPY  11/03/07   4-mm sessile polyp removed/small internal hemorrhoids/tubular adenoma, random colon bx negative for microscopic colitis   COLONOSCOPY WITH PROPOFOL N/A 09/17/2016   Grade 2 hemorrhoids, colonic diverticulosis, ascending colon polyp (sessile serrated adenoma), 5 year surveillance   COLONOSCOPY WITH PROPOFOL N/A 09/22/2021   Procedure: COLONOSCOPY WITH PROPOFOL;  Surgeon: Eloise Harman, DO;  [facility];  Service: Endoscopy;  Laterality: N/A;  7:30am   COLONOSCOPY, ESOPHAGOGASTRODUODENOSCOPY (EGD) AND ESOPHAGEAL DILATION  01/2012   mild gastritis s/p biopsy. Empiric dilation. Normal duodenum.    DILATATION & CURETTAGE/HYSTEROSCOPY WITH  MYOSURE N/A 02/10/2015   Procedure: DILATATION & CURETTAGE/HYSTEROSCOPY WITH MYOSURE;  Surgeon: Nunzio Cobbs, MD;  Location: [facility];  Service: Gynecology;  Laterality: N/A;   ESOPHAGOGASTRODUODENOSCOPY (EGD) WITH PROPOFOL N/A 01/11/2020   Normal esophagus, s/p dilation, normal stomach, normal duodenum.    ESOPHAGOGASTRODUODENOSCOPY (EGD) WITH PROPOFOL N/A 09/22/2021   Procedure: ESOPHAGOGASTRODUODENOSCOPY (EGD) WITH PROPOFOL;  Surgeon: Eloise Harman, DO;  [facility];  Service: Endoscopy;  Laterality: N/A;   MALONEY DILATION N/A 01/11/2020   Procedure: Venia Minks DILATION;  Surgeon: Daneil Dolin, MD;  [facility];  Service: Endoscopy;  Laterality: N/A;   MM BREAST STEREO BX*L*R/S     rt.   POLYPECTOMY  09/17/2016   Procedure: POLYPECTOMY;  Surgeon: Daneil Dolin, MD;  [facility];  Service: Endoscopy;;  ascending colon   POLYPECTOMY  09/22/2021   Procedure: POLYPECTOMY;  Surgeon: Eloise Harman, DO;  [facility];  Service: Endoscopy;;   PORT-A-CATH REMOVAL  05/26/2012   Procedure: REMOVAL PORT-A-CATH;  Surgeon: Jamesetta So, MD;  Location: [facility];  Service: General;  Laterality: N/A;  Minor Room   PORTACATH PLACEMENT      Current Outpatient Medications  Medication Sig Dispense Refill   atorvastatin (LIPITOR) 40 MG tablet TAKE 1 TABLET BY MOUTH AT BEDTIME 90 tablet 0   blood glucose meter kit and supplies Dispense based on patient and insurance preference. Use up to four times daily as directed. (FOR ICD-10 E10.9, E11.9). 1 each 5   cetirizine (ZYRTEC) 10 MG tablet Take 1 tablet by mouth once daily 30 tablet 2   citalopram (CELEXA) 40 MG tablet Take 1 tablet by mouth once daily 90 tablet 0   Continuous Blood Gluc Receiver (FREESTYLE LIBRE 14 DAY READER) DEVI Use as directed. 1 each 0   Continuous Blood Gluc Sensor (FREESTYLE LIBRE 14 DAY SENSOR) MISC Use as directed 1 each 3   DULoxetine (CYMBALTA) 30 MG capsule TAKE 1 CAPSULE BY MOUTH AT BEDTIME. FOR NERVE PAIN- CAN TAKE CELEXA 40 MG DAILY- BUT CANNOT INCREASE DULOXETINE ON CURRENT CELEXA DOSE 30 capsule 5   ergocalciferol (VITAMIN D2) 1.25 MG (50000 UT) capsule Take 1 capsule (50,000 Units total) by mouth once a week. 4 capsule 12   fluticasone (FLONASE) 50 MCG/ACT nasal spray USE 2 SPRAY(S) IN EACH NOSTRIL ONCE DAILY AS NEEDED 16 g 0   HYDROcodone-acetaminophen (NORCO/VICODIN) 5-325 MG tablet Take 1 tablet by mouth every 8 (eight) hours as needed for severe  pain. 90 tablet 0   lisinopril (ZESTRIL) 10 MG tablet TAKE 1 TABLET BY MOUTH ONCE DAILY . APPOINTMENT REQUIRED FOR FUTURE REFILLS 45 tablet 0   metFORMIN (GLUCOPHAGE-XR) 750 MG 24 hr tablet TAKE 1 TABLET BY MOUTH ONCE DAILY WITH BREAKFAST . 90 tablet 1   Multiple Vitamin (MULTIVITAMIN) capsule Take 1 capsule by mouth daily.     Omega-3 Fatty Acids (FISH OIL) 1000 MG CAPS Take 1,000 mg by mouth daily.     ondansetron (ZOFRAN-ODT) 8 MG disintegrating tablet DISSOLVE 1 TABLET IN MOUTH EVERY 8 HOURS AS NEEDED FOR NAUSEA FOR VOMITING 20 tablet 0   pantoprazole (PROTONIX) 40 MG tablet Take 1 tablet (40 mg total) by mouth 2 (two) times daily before a meal. 116 tablet 3   TRULICITY 5.79 UX/8.3FX SOPN INJECT 0.75 MG SUBCUTANEOUSLY ONCE A WEEK 4 mL 0   No current facility-administered medications for this visit.    Family History  Problem Relation Age of Onset  Colon cancer Mother        mid-50s, died of metastatic disease   Cancer Mother        liver   Coronary artery disease Mother    Diabetes type I Mother    Peripheral vascular disease Mother        Carotid disease in her 31s   Coronary artery disease Father        CAD in his 80s   Diabetes Father    Diabetes type I Father    Hypertension Father    Bipolar disorder Sister    Coronary artery disease Brother        MI in his 55s   Hypertension Brother    Hypertension Maternal Grandmother    Hyperlipidemia Maternal Grandmother    Stroke Maternal Grandmother    Hypertension Maternal Grandfather    Diabetes Maternal Grandfather    Diabetes Paternal Grandmother    Heart disease Paternal Grandfather     Review of Systems  Constitutional:  Positive for fatigue.  All other systems reviewed and are negative.  Exam:   BP 140/80    Pulse 97    Ht $R'5\' 3"'kX$  (1.6 m)    Wt 235 lb (106.6 kg)    LMP 04/05/2016 Comment: spotting 11/2016 and 12/2016   SpO2 100%    BMI 41.63 kg/m     General appearance: alert, cooperative and appears stated  age Head: normocephalic, without obvious abnormality, atraumatic Neck: no adenopathy, supple, symmetrical, trachea midline and thyroid normal to inspection and palpation Lungs: clear to auscultation bilaterally Breasts: left - normal appearance, no masses or tenderness, No nipple retraction or dimpling, No nipple discharge or bleeding, No axillary adenopathy Right- consistent with prior surgery, scar tissue noted, no masses or tenderness, No nipple discharge or bleeding, No axillary adenopathy Heart: regular rate and rhythm Abdomen: obese, soft, non-tender; no masses, no organomegaly Extremities: extremities normal, atraumatic, no cyanosis or edema Skin: skin color, texture, turgor normal. No rashes or lesions Lymph nodes: cervical, supraclavicular, and axillary nodes normal. Neurologic: grossly normal  Pelvic: External genitalia:  no lesions              No abnormal inguinal nodes palpated.              Urethra:  normal appearing urethra with no masses, tenderness or lesions              Bartholins and Skenes: normal                 Vagina: normal appearing vagina with normal color and discharge, no lesions              Cervix: no lesions              Pap taken: yes Bimanual Exam:  Uterus:  normal size, contour, position, consistency, mobility, non-tender              Adnexa: no mass, fullness, tenderness              Rectal exam: yes.  Confirms.              Anus:  normal sphincter tone, no lesions  Chaperone was present for exam: yes.  Assessment:   Well woman visit with gynecologic exam. Hx right breast cancer.  Hx LGSIL.  Prior LEEP.  Vaginal discharge. Hx fibroids.  Possible adenomyosis. Menopausal symptoms.  FH of colon cancer in mother.   Plan: Mammogram screening discussed. Self breast awareness reviewed.  Pap and HR HPV collected. Guidelines for Calcium, Vitamin D, regular exercise program including cardiovascular and weight bearing exercise. Vaginitis swab  collected.  FSH, estradiol.  Follow up annually and prn.   After visit summary provided.

## 2022-01-05 LAB — CERVICOVAGINAL ANCILLARY ONLY
Bacterial Vaginitis (gardnerella): NEGATIVE
Candida Glabrata: NEGATIVE
Candida Vaginitis: NEGATIVE
Comment: NEGATIVE
Comment: NEGATIVE
Comment: NEGATIVE
Comment: NEGATIVE
Trichomonas: NEGATIVE

## 2022-01-05 LAB — ESTRADIOL: Estradiol: 18 pg/mL

## 2022-01-05 LAB — FOLLICLE STIMULATING HORMONE: FSH: 22.7 m[IU]/mL

## 2022-01-05 LAB — CYTOLOGY - PAP
Comment: NEGATIVE
Diagnosis: NEGATIVE
High risk HPV: NEGATIVE

## 2022-01-06 ENCOUNTER — Other Ambulatory Visit: Payer: Self-pay | Admitting: Family Medicine

## 2022-01-06 DIAGNOSIS — E1165 Type 2 diabetes mellitus with hyperglycemia: Secondary | ICD-10-CM

## 2022-01-06 DIAGNOSIS — J302 Other seasonal allergic rhinitis: Secondary | ICD-10-CM

## 2022-01-09 ENCOUNTER — Other Ambulatory Visit: Payer: Self-pay | Admitting: Family Medicine

## 2022-01-09 ENCOUNTER — Other Ambulatory Visit (HOSPITAL_COMMUNITY): Payer: Self-pay | Admitting: Hematology

## 2022-01-09 DIAGNOSIS — E1165 Type 2 diabetes mellitus with hyperglycemia: Secondary | ICD-10-CM

## 2022-01-09 DIAGNOSIS — C50911 Malignant neoplasm of unspecified site of right female breast: Secondary | ICD-10-CM

## 2022-01-11 ENCOUNTER — Telehealth: Payer: Self-pay | Admitting: Family Medicine

## 2022-01-11 ENCOUNTER — Other Ambulatory Visit: Payer: Self-pay

## 2022-01-11 MED ORDER — LISINOPRIL 10 MG PO TABS: ORAL_TABLET | ORAL | 0 refills | Status: DC

## 2022-01-11 NOTE — Telephone Encounter (Signed)
Patient is requesting refill on lisinopril 10 mg called into [facility]

## 2022-01-11 NOTE — Telephone Encounter (Signed)
Prescription sent electronically to pharmacy. Patient notified. 

## 2022-01-18 ENCOUNTER — Telehealth: Payer: Self-pay | Admitting: *Deleted

## 2022-01-18 MED ORDER — HYDROCODONE-ACETAMINOPHEN 5-325 MG PO TABS: 1.0000 | ORAL_TABLET | Freq: Three times a day (TID) | ORAL | 0 refills | Status: DC | PRN

## 2022-01-18 NOTE — Telephone Encounter (Signed)
Mrs Linehan is requesting a refill on her hydrocodone. Last fill date is 12/21/21 and next appt is 01/26/22. ?

## 2022-01-18 NOTE — Telephone Encounter (Signed)
Notified. 

## 2022-01-26 ENCOUNTER — Encounter: Payer: Self-pay | Admitting: Family Medicine

## 2022-01-26 ENCOUNTER — Encounter: Payer: Self-pay | Admitting: *Deleted

## 2022-01-26 ENCOUNTER — Other Ambulatory Visit: Payer: Self-pay

## 2022-01-26 ENCOUNTER — Encounter: Payer: Self-pay | Admitting: Physical Medicine and Rehabilitation

## 2022-01-26 ENCOUNTER — Ambulatory Visit (HOSPITAL_COMMUNITY)
Admission: RE | Admit: 2022-01-26 | Discharge: 2022-01-26 | Disposition: A | Discharge status: Home / Self Care | Payer: 59 | Source: Ambulatory Visit | Attending: Family Medicine | Admitting: Family Medicine

## 2022-01-26 ENCOUNTER — Ambulatory Visit (INDEPENDENT_AMBULATORY_CARE_PROVIDER_SITE_OTHER): Payer: 59 | Admitting: Family Medicine

## 2022-01-26 VITALS — BP 136/86 | HR 123 | Temp 99.0°F | Wt 239.6 lb

## 2022-01-26 DIAGNOSIS — R059 Cough, unspecified: Secondary | ICD-10-CM | POA: Insufficient documentation

## 2022-01-26 MED ORDER — PROMETHAZINE-DM 6.25-15 MG/5ML PO SYRP
5.0000 mL | ORAL_SOLUTION | Freq: Four times a day (QID) | ORAL | 0 refills | Status: DC | PRN
Start: 2022-01-26 — End: 2022-05-01

## 2022-01-26 NOTE — Assessment & Plan Note (Signed)
Patient with significant cough.  Concern for viral illness.  Possible COVID-19.  Awaiting test results.  Chest x-ray was obtained today was independently reviewed by me.  Interpretation: Normal chest x-ray.  No evidence of infiltrate.  Promethazine DM for cough.  Supportive care. ?

## 2022-01-26 NOTE — Patient Instructions (Signed)
This is likely viral in origin. Possibly COVID. Awaiting test results. ? ?Rest. Lots of fluids. ? ?Medication as prescribed. ? ?Chest xray today. ? ?Take care ? ?Dr. Lacinda Axon  ?

## 2022-01-26 NOTE — Progress Notes (Signed)
? ?Subjective:  ?Patient ID: [name], female    DOB: [birth year]  Age: 56 y.o. MRN: [id number] ? ?CC: ?Chief Complaint  ?Patient presents with  ? Fever  ?  Congestion, fever, feels terrible. Began feeling sluggish first part of week.   ? ? ?HPI: ? ?56 year old female presents for evaluation of the above. ? ?Patient reports that she has been sick since yesterday.  She reports that her "client" has been under the weather with similar symptoms.  She reports cough, nausea, body aches.  Subjective fever.  She states that she was very cold last night.  No documented fever.  No medications or interventions tried.  She feels very poorly.  No relieving factors. ? ?Patient Active Problem List  ? Diagnosis Date Noted  ? Cough 01/26/2022  ? Trigger finger 10/24/2021  ? Lumbosacral radiculopathy 07/14/2021  ? Diabetes mellitus without complication ([place]) 89/16/9450  ? 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 Panic disorder 01/09/2017  ? Fatty liver 09/06/2016  ? H/O adenomatous polyp of colon 01/24/2012  ? FH: colon cancer 01/24/2012  ? GERD (gastroesophageal reflux disease) 06/22/2011  ? Hyperlipidemia 06/22/2011  ? Essential hypertension 06/22/2011  ? Obesity 06/22/2011  ? 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[place]) 05/22/2011  ? ? ?Social Hx   ?Social History  ? ?Socioeconomic History  ? Marital status: Married  ?  Spouse name: Not on file  ? Number of children: 3  ? Years of education: Not on file  ? Highest education level: Not on file  ?Occupational History  ? Occupation: child care  ?  Employer: [employer]  ?Tobacco Use  ? Smoking status: Never  ? Smokeless tobacco: Never  ?Vaping Use  ? Vaping Use: Never used  ?Substance and Sexual Activity  ? Alcohol use: No  ?  Alcohol/week: 0.0 standard drinks  ? Drug use: No  ? Sexual activity: Yes  ?  Partners: Male  ?  Birth control/protection: None  ?Other Topics Concern  ? Not on file  ?Social History Narrative   ? Not on file  ? ?Social Determinants of Health  ? ?Financial Resource Strain: Not on file  ?Food Insecurity: Not on file  ?Transportation Needs: Not on file  ?Physical Activity: Not on file  ?Stress: Not on file  ?Social Connections: Not on file  ? ? ?Review of Systems ?Per HPI ? ?Objective:  ?BP 136/86   Pulse (!) 123   Temp 99 ?F (37.2 ?C)   Wt 239 lb 9.6 oz (108.7 kg)   LMP 04/05/2016 Comment: spotting 11/2016 and 12/2016  SpO2 95%   BMI 42.44 kg/m?  ? ?BP/Weight 01/26/2022 01/04/2022 10/24/2021  ?Systolic BP 388 828 003  ?Diastolic BP 86 80 78  ?Wt. (Lbs) 239.6 235 242  ?BMI 42.44 41.63 41.54  ? ? ?Physical Exam ?Vitals and nursing note reviewed.  ?Constitutional:   ?   General: She is not in acute distress. ?   Appearance: She is obese.  ?HENT:  ?   Head: Normocephalic and atraumatic.  ?   Mouth/Throat:  ?   Mouth: Mucous membranes are moist.  ?   Pharynx: Oropharynx is clear.  ?Eyes:  ?   General:     ?   Right eye: No discharge.     ?   Left eye: No discharge.  ?   Conjunctiva/sclera: Conjunctivae normal.  ?Cardiovascular:  ?   Rate and Rhythm: Regular rhythm. Tachycardia present.  ?Pulmonary:  ?  Effort: Pulmonary effort is normal.  ?   Breath sounds: Normal breath sounds. No wheezing or rales.  ?Neurological:  ?   Mental Status: She is alert.  ? ? ?Lab Results  ?Component Value Date  ? WBC 6.1 04/27/2021  ? HGB 12.9 04/27/2021  ? HCT 41.7 04/27/2021  ? PLT 218 04/27/2021  ? GLUCOSE 113 (H) 10/19/2021  ? CHOL 158 10/19/2021  ? TRIG 147 10/19/2021  ? HDL 44 10/19/2021  ? [place] 88 10/19/2021  ? ALT 20 10/19/2021  ? AST 15 10/19/2021  ? NA 142 10/19/2021  ? K 4.2 10/19/2021  ? CL 101 10/19/2021  ? CREATININE 0.89 10/19/2021  ? BUN 9 10/19/2021  ? CO2 26 10/19/2021  ? TSH 2.810 12/14/2017  ? HGBA1C 6.1 (H) 10/19/2021  ? MICROALBUR 1.2 10/20/2018  ? ? ? ?Assessment & Plan:  ? ?Problem List Items Addressed This Visit   ? ?  ? Other  ? Cough - Primary  ?  Patient with significant cough.  Concern for viral  illness.  Possible COVID-19.  Awaiting test results.  Chest x-ray was obtained today was independently reviewed by me.  Interpretation: Normal chest x-ray.  No evidence of infiltrate.  Promethazine DM for cough.  Supportive care. ?  ?  ? Relevant Orders  ? DG Chest 2 View (Completed)  ? COVID-19, Flu A+B and RSV  ? ? ?Meds ordered this encounter  ?Medications  ? promethazine-dextromethorphan (PROMETHAZINE-DM) 6.25-15 MG/5ML syrup  ?  Sig: Take 5 mLs by mouth 4 (four) times daily as needed.  ?  Dispense:  118 mL  ?  Refill:  0  ? ? ?Thersa Salt DO ?[place] ? ?

## 2022-01-28 ENCOUNTER — Encounter: Payer: Self-pay | Admitting: Family Medicine

## 2022-01-28 LAB — COVID-19, FLU A+B AND RSV
Influenza A, NAA: NOT DETECTED
Influenza B, NAA: NOT DETECTED
RSV, NAA: NOT DETECTED
SARS-CoV-2, NAA: DETECTED — AB

## 2022-01-29 ENCOUNTER — Other Ambulatory Visit: Payer: Self-pay | Admitting: Family Medicine

## 2022-01-29 MED ORDER — NIRMATRELVIR/RITONAVIR (PAXLOVID)TABLET: 3.0000 | ORAL_TABLET | Freq: Two times a day (BID) | ORAL | 0 refills | Status: AC

## 2022-01-30 ENCOUNTER — Encounter: Payer: Self-pay | Admitting: Family Medicine

## 2022-01-30 ENCOUNTER — Telehealth: Payer: Self-pay | Admitting: *Deleted

## 2022-01-30 NOTE — Telephone Encounter (Signed)
Patient just wanted to clarify a few things... ? 1)  illness started Friday and positive test come back Sunday- is she good to come out of quarantine Thursday ? ?2) She would like work note thru Monday so she will have time to gain her strength back ? ?3) someone at work mentioned her she needed to get another Covid test to make sure it was now negative and she didn't know if this was something she needed to be able to return to work and be around others ?

## 2022-01-30 NOTE — Telephone Encounter (Signed)
Patient advised of md message and recommendations. Verbalized understanding. 

## 2022-01-30 NOTE — Telephone Encounter (Signed)
Coral Spikes, DO   ? ?Yes okay to come out of quarantine on Thursday.  ?Okay to give work note.  ?No need for repeat testing per CDC.   ? ?

## 2022-02-04 ENCOUNTER — Other Ambulatory Visit: Payer: Self-pay | Admitting: Family Medicine

## 2022-02-05 ENCOUNTER — Other Ambulatory Visit: Payer: Self-pay | Admitting: Family Medicine

## 2022-02-16 ENCOUNTER — Telehealth: Payer: Self-pay | Admitting: *Deleted

## 2022-02-16 MED ORDER — HYDROCODONE-ACETAMINOPHEN 5-325 MG PO TABS: 1.0000 | ORAL_TABLET | Freq: Three times a day (TID) | ORAL | 0 refills | Status: DC | PRN

## 2022-02-16 NOTE — Telephone Encounter (Signed)
Asking for a refill on her pain medication hydrocodone. ?

## 2022-02-27 ENCOUNTER — Ambulatory Visit: Payer: 59 | Admitting: Gastroenterology

## 2022-03-02 ENCOUNTER — Other Ambulatory Visit: Payer: Self-pay | Admitting: Family Medicine

## 2022-03-02 DIAGNOSIS — E1165 Type 2 diabetes mellitus with hyperglycemia: Secondary | ICD-10-CM

## 2022-03-05 ENCOUNTER — Other Ambulatory Visit: Payer: Self-pay | Admitting: Family Medicine

## 2022-03-05 DIAGNOSIS — J302 Other seasonal allergic rhinitis: Secondary | ICD-10-CM

## 2022-03-05 DIAGNOSIS — E1165 Type 2 diabetes mellitus with hyperglycemia: Secondary | ICD-10-CM

## 2022-03-13 ENCOUNTER — Other Ambulatory Visit: Payer: Self-pay | Admitting: Family Medicine

## 2022-03-14 ENCOUNTER — Telehealth: Payer: Self-pay | Admitting: *Deleted

## 2022-03-14 NOTE — Telephone Encounter (Signed)
Mrs Luby called for a refill on her pain medication hydrocodone 5/325 last filled 02/16/22. Next appt is 05/11/22. ?

## 2022-03-15 MED ORDER — HYDROCODONE-ACETAMINOPHEN 5-325 MG PO TABS: 1.0000 | ORAL_TABLET | Freq: Three times a day (TID) | ORAL | 0 refills | Status: DC | PRN

## 2022-03-15 NOTE — Addendum Note (Signed)
Addended by: Courtney Heys on: 03/15/2022 11:00 AM ? ? Modules accepted: Orders ? ?

## 2022-03-21 ENCOUNTER — Other Ambulatory Visit: Payer: Self-pay | Admitting: Family Medicine

## 2022-03-21 DIAGNOSIS — J302 Other seasonal allergic rhinitis: Secondary | ICD-10-CM

## 2022-03-29 ENCOUNTER — Other Ambulatory Visit: Payer: Self-pay | Admitting: Family Medicine

## 2022-03-29 DIAGNOSIS — J302 Other seasonal allergic rhinitis: Secondary | ICD-10-CM

## 2022-03-30 ENCOUNTER — Other Ambulatory Visit: Payer: Self-pay | Admitting: Family Medicine

## 2022-03-30 ENCOUNTER — Other Ambulatory Visit: Payer: Self-pay | Admitting: Gastroenterology

## 2022-03-30 ENCOUNTER — Other Ambulatory Visit (HOSPITAL_COMMUNITY): Payer: Self-pay | Admitting: Hematology

## 2022-03-30 DIAGNOSIS — C50911 Malignant neoplasm of unspecified site of right female breast: Secondary | ICD-10-CM

## 2022-03-30 DIAGNOSIS — F324 Major depressive disorder, single episode, in partial remission: Secondary | ICD-10-CM

## 2022-04-12 ENCOUNTER — Telehealth: Payer: Self-pay | Admitting: *Deleted

## 2022-04-12 NOTE — Telephone Encounter (Signed)
[name] is requesting a refill on her hydrocodone. Per the PMP the last fill date was 03/16/22.

## 2022-04-13 MED ORDER — HYDROCODONE-ACETAMINOPHEN 5-325 MG PO TABS: 1.0000 | ORAL_TABLET | Freq: Three times a day (TID) | ORAL | 0 refills | Status: DC | PRN

## 2022-04-21 ENCOUNTER — Other Ambulatory Visit: Payer: Self-pay | Admitting: Family Medicine

## 2022-04-24 ENCOUNTER — Ambulatory Visit: Payer: 59 | Admitting: Family Medicine

## 2022-04-27 ENCOUNTER — Telehealth: Payer: Self-pay

## 2022-04-27 ENCOUNTER — Other Ambulatory Visit: Payer: Self-pay | Admitting: Family Medicine

## 2022-04-27 DIAGNOSIS — E1165 Type 2 diabetes mellitus with hyperglycemia: Secondary | ICD-10-CM

## 2022-04-27 MED ORDER — CLONAZEPAM 0.5 MG PO TABS: 0.5000 mg | ORAL_TABLET | Freq: Two times a day (BID) | ORAL | 0 refills | Status: DC | PRN

## 2022-04-27 NOTE — Telephone Encounter (Signed)
Patient informed that prescription was sent in. Verbalized understanding.

## 2022-04-27 NOTE — Telephone Encounter (Signed)
Caller name:Yuka Truman Hayward   On DPR? :YEs  Call back [phone]  Provider they see: Lacinda Axon   Reason for call: Pt sister has cancer and they are taking her off life support and pt is very upset and want to know if something can be called in to help her nerves she was very up set when she called in

## 2022-04-27 NOTE — Telephone Encounter (Signed)
Please advise. Thank you

## 2022-05-01 ENCOUNTER — Encounter: Payer: Self-pay | Admitting: Family Medicine

## 2022-05-01 ENCOUNTER — Telehealth: Payer: Self-pay

## 2022-05-01 ENCOUNTER — Other Ambulatory Visit (HOSPITAL_COMMUNITY)
Admission: RE | Admit: 2022-05-01 | Discharge: 2022-05-01 | Disposition: A | Discharge status: Home / Self Care | Payer: 59 | Source: Other Acute Inpatient Hospital | Attending: Family Medicine | Admitting: Family Medicine

## 2022-05-01 ENCOUNTER — Ambulatory Visit (INDEPENDENT_AMBULATORY_CARE_PROVIDER_SITE_OTHER): Payer: 59 | Admitting: Family Medicine

## 2022-05-01 VITALS — BP 104/74 | HR 86 | Temp 98.5°F | Wt 240.4 lb

## 2022-05-01 DIAGNOSIS — R55 Syncope and collapse: Secondary | ICD-10-CM

## 2022-05-01 DIAGNOSIS — G894 Chronic pain syndrome: Secondary | ICD-10-CM | POA: Insufficient documentation

## 2022-05-01 DIAGNOSIS — I447 Left bundle-branch block, unspecified: Secondary | ICD-10-CM

## 2022-05-01 LAB — COMPREHENSIVE METABOLIC PANEL
ALT: 24 U/L (ref 0–44)
AST: 23 U/L (ref 15–41)
Albumin: 4.2 g/dL (ref 3.5–5.0)
Alkaline Phosphatase: 55 U/L (ref 38–126)
Anion gap: 7 (ref 5–15)
BUN: 14 mg/dL (ref 6–20)
CO2: 28 mmol/L (ref 22–32)
Calcium: 9.1 mg/dL (ref 8.9–10.3)
Chloride: 102 mmol/L (ref 98–111)
Creatinine, Ser: 0.93 mg/dL (ref 0.44–1.00)
GFR, Estimated: >60 mL/min (ref >60)
Glucose, Bld: 102 mg/dL — ABNORMAL HIGH (ref 70–99)
Potassium: 4 mmol/L (ref 3.5–5.1)
Sodium: 137 mmol/L (ref 135–145)
Total Bilirubin: 0.6 mg/dL (ref 0.3–1.2)
Total Protein: 8.1 g/dL (ref 6.5–8.1)

## 2022-05-01 LAB — CBC
HCT: 41.9 % (ref 36.0–46.0)
Hemoglobin: 13.1 g/dL (ref 12.0–15.0)
MCH: 28.2 pg (ref 26.0–34.0)
MCHC: 31.3 g/dL (ref 30.0–36.0)
MCV: 90.1 fL (ref 80.0–100.0)
Platelets: 229 10*3/uL (ref 150–400)
RBC: 4.65 MIL/uL (ref 3.87–5.11)
RDW: 15.4 % (ref 11.5–15.5)
WBC: 7 10*3/uL (ref 4.0–10.5)
nRBC: 0 % (ref 0.0–0.2)

## 2022-05-01 LAB — TROPONIN I (HIGH SENSITIVITY): Troponin I (High Sensitivity): 3 ng/L (ref <18)

## 2022-05-01 MED ORDER — LISINOPRIL 5 MG PO TABS: 5.0000 mg | ORAL_TABLET | Freq: Every day | ORAL | 0 refills | Status: DC

## 2022-05-01 NOTE — Telephone Encounter (Signed)
PA done for the pt's Pantoprazole 40 mg. Dx: used R13.19 and K21.9. also had to send along the last ov for the pt. Waiting on a response from Cover My Meds.

## 2022-05-01 NOTE — Patient Instructions (Signed)
Labs at the hospital today.  I have decreased your lisinopril.  We will call with results.  Take care  Dr. Lacinda Axon

## 2022-05-01 NOTE — Telephone Encounter (Signed)
Pt's Pantoprazole was approved on 05-01-2022 and it will end on 05-02-2023. PA Case [id number].  Phoned and spoke with Richardson Landry at [facility] in [place] and advised him that the pt's Rx was approved.  Gave documentation to Manuela Schwartz to scan in pt's chart.

## 2022-05-02 ENCOUNTER — Other Ambulatory Visit (HOSPITAL_COMMUNITY): Payer: Self-pay | Admitting: *Deleted

## 2022-05-02 ENCOUNTER — Other Ambulatory Visit: Payer: Self-pay | Admitting: Family Medicine

## 2022-05-02 ENCOUNTER — Other Ambulatory Visit: Payer: Self-pay | Admitting: Physical Medicine and Rehabilitation

## 2022-05-02 DIAGNOSIS — E559 Vitamin D deficiency, unspecified: Secondary | ICD-10-CM

## 2022-05-02 DIAGNOSIS — D649 Anemia, unspecified: Secondary | ICD-10-CM

## 2022-05-02 DIAGNOSIS — J302 Other seasonal allergic rhinitis: Secondary | ICD-10-CM

## 2022-05-02 DIAGNOSIS — R55 Syncope and collapse: Secondary | ICD-10-CM | POA: Insufficient documentation

## 2022-05-02 LAB — CBC WITH DIFFERENTIAL/PLATELET
Basophils Absolute: 0 10*3/uL (ref 0.0–0.1)
Basophils Relative: 1 %
Eosinophils Absolute: 0.2 10*3/uL (ref 0.0–0.5)
Eosinophils Relative: 2 %
Lymphocytes Relative: 26 %
Lymphs Abs: 1.8 10*3/uL (ref 0.7–4.0)
Monocytes Absolute: 0.5 10*3/uL (ref 0.1–1.0)
Monocytes Relative: 8 %
Neutro Abs: 4.5 10*3/uL (ref 1.7–7.7)
Neutrophils Relative %: 63 %
nRBC: 0 /100 WBC

## 2022-05-02 NOTE — Progress Notes (Signed)
Subjective:  Patient ID: [name], female    DOB: 10-Sep-1966  Age: 56 y.o. MRN: [id number]  CC: Chief Complaint  Patient presents with   Hypertension    Pt has concerns regarding her strength-recently pt noticed that she has loss strength in legs and hands. Pt has been unable to check sugars or blood pressure    HPI:  56 year old female with hypertension, history of left bundle branch block, fatty liver, GERD, diabetes, mood disorder, chronic pain, hyperlipidemia presents for evaluation of the above.  Patient recently lost a relative.  It was expected but not as quickly as it occurred.  Patient reports that when she found out the news she had a syncopal episode.  She states that she is not feeling well.  She is not eating and drinking as she normally does.  She feels weak.  She has been feeling dizzy as well.  Blood pressure initially 96/68 here today.  She endorses compliance with her medications.  Denies chest pain.  No reports of shortness of breath.  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   Fatty liver 09/06/2016   H/O adenomatous polyp of colon 01/24/2012   FH: colon cancer 01/24/2012   GERD (gastroesophageal reflux disease) 06/22/2011   Hyperlipidemia 06/22/2011   Essential hypertension 06/22/2011   Obesity 06/22/2011   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[place]) 05/22/2011    Social Hx   Social History   Socioeconomic History   Marital status: Married    Spouse name: Not on file   Number of children: 3   Years of education: Not on file   Highest education level: Not on file  Occupational History   Occupation: child care    Employer: [employer]  Tobacco Use    Smoking status: Never   Smokeless tobacco: Never  Vaping Use   Vaping Use: Never used  Substance and Sexual Activity   Alcohol use: No    Alcohol/week: 0.0 standard drinks of alcohol   Drug use: No   Sexual activity: Yes    Partners: Male    Birth control/protection: None  Other Topics Concern   Not on file  Social History Narrative   Not on file   Social Determinants of Health   Financial Resource Strain: Not on file  Food Insecurity: Not on file  Transportation Needs: Not on file  Physical Activity: Not on file  Stress: Not on file  Social Connections: Not on file    Review of Systems Per HPI  Objective:  BP 104/74   Pulse 86   Temp 98.5 F (36.9 C)   Wt 240 lb 6.4 oz (109 kg)   LMP 04/05/2016 Comment: spotting 11/2016 and 12/2016  SpO2 95%   BMI 42.58 kg/m      05/01/2022    4:01 PM 05/01/2022    3:24 PM 01/26/2022    2:58 PM  BP/Weight  Systolic BP 423 96 536  Diastolic BP 74 68 86  Wt. (Lbs)  240.4 239.6  BMI  42.58 kg/m2 42.44 kg/m2    Physical Exam Vitals and nursing note reviewed.  Constitutional:      Appearance: Normal appearance. She is obese.  Eyes:     General:  Right eye: No discharge.        Left eye: No discharge.     Conjunctiva/sclera: Conjunctivae normal.  Cardiovascular:     Rate and Rhythm: Normal rate and regular rhythm.  Pulmonary:     Effort: Pulmonary effort is normal.     Breath sounds: Normal breath sounds. No wheezing, rhonchi or rales.  Neurological:     Mental Status: She is alert.  Psychiatric:        Mood and Affect: Mood normal.        Behavior: Behavior normal.     Lab Results  Component Value Date   WBC 7.0 05/01/2022   HGB 13.1 05/01/2022   HCT 41.9 05/01/2022   PLT 229 05/01/2022   GLUCOSE 102 (H) 05/01/2022   CHOL 158 10/19/2021   TRIG 147 10/19/2021   HDL 44 10/19/2021   LDLCALC 88 10/19/2021   ALT 24 05/01/2022   AST 23 05/01/2022   NA 137 05/01/2022   K 4.0 05/01/2022   CL 102 05/01/2022    CREATININE 0.93 05/01/2022   BUN 14 05/01/2022   CO2 28 05/01/2022   TSH 2.810 12/14/2017   HGBA1C 6.1 (H) 10/19/2021   MICROALBUR 1.2 10/20/2018     Assessment & Plan:   Problem List Items Addressed This Visit       Cardiovascular and Mediastinum   LBBB (left bundle branch block)   Relevant Medications   lisinopril (ZESTRIL) 5 MG tablet     Other   Syncope - Primary    Stat labs obtained today including troponin and were unremarkable.  EKG was obtained and revealed bundle-branch block.  Patient's blood pressure was repeated today and was still on the low side.  Lisinopril decreased to 5 mg daily.  Advised patient she needs to eat and drink regularly.  I suspect that this was secondary to vasovagal response.  If recurs, will refer to cardiology.      Relevant Orders   EKG 12-Lead (Completed)   CBC   Troponin I   Comprehensive metabolic panel    Meds ordered this encounter  Medications   lisinopril (ZESTRIL) 5 MG tablet    Sig: Take 1 tablet (5 mg total) by mouth daily.    Dispense:  90 tablet    Refill:  [place]

## 2022-05-02 NOTE — Assessment & Plan Note (Addendum)
Stat labs obtained today including troponin and were unremarkable.  EKG was obtained and was independently interpreted by me.  Interpretation: Sinus rhythm.  Left bundle-branch block.  Patient's blood pressure was repeated today and was still on the low side.  Lisinopril decreased to 5 mg daily.  Advised patient she needs to eat and drink regularly.  I suspect that this was secondary to vasovagal response.  If recurs, will refer to cardiology.

## 2022-05-03 ENCOUNTER — Inpatient Hospital Stay (HOSPITAL_COMMUNITY): Payer: 59 | Attending: Hematology

## 2022-05-03 ENCOUNTER — Ambulatory Visit (HOSPITAL_COMMUNITY)
Admission: RE | Admit: 2022-05-03 | Discharge: 2022-05-03 | Disposition: A | Discharge status: Home / Self Care | Payer: 59 | Source: Ambulatory Visit | Attending: Hematology | Admitting: Hematology

## 2022-05-03 DIAGNOSIS — D649 Anemia, unspecified: Secondary | ICD-10-CM

## 2022-05-03 DIAGNOSIS — Z1231 Encounter for screening mammogram for malignant neoplasm of breast: Secondary | ICD-10-CM

## 2022-05-03 DIAGNOSIS — E559 Vitamin D deficiency, unspecified: Secondary | ICD-10-CM

## 2022-05-03 LAB — VITAMIN D 25 HYDROXY (VIT D DEFICIENCY, FRACTURES): Vit D, 25-Hydroxy: 69.89 ng/mL (ref 30–100)

## 2022-05-08 ENCOUNTER — Other Ambulatory Visit: Payer: Self-pay | Admitting: Family Medicine

## 2022-05-10 ENCOUNTER — Inpatient Hospital Stay (HOSPITAL_COMMUNITY): Payer: 59 | Admitting: Hematology

## 2022-05-10 VITALS — BP 152/80 | HR 84 | Temp 98.4°F | Resp 18 | Ht 63.0 in | Wt 229.6 lb

## 2022-05-10 DIAGNOSIS — Z923 Personal history of irradiation: Secondary | ICD-10-CM | POA: Insufficient documentation

## 2022-05-10 DIAGNOSIS — E559 Vitamin D deficiency, unspecified: Secondary | ICD-10-CM | POA: Insufficient documentation

## 2022-05-10 DIAGNOSIS — Z9221 Personal history of antineoplastic chemotherapy: Secondary | ICD-10-CM | POA: Insufficient documentation

## 2022-05-10 DIAGNOSIS — C50911 Malignant neoplasm of unspecified site of right female breast: Secondary | ICD-10-CM

## 2022-05-10 DIAGNOSIS — Z853 Personal history of malignant neoplasm of breast: Secondary | ICD-10-CM | POA: Diagnosis present

## 2022-05-10 DIAGNOSIS — G629 Polyneuropathy, unspecified: Secondary | ICD-10-CM | POA: Insufficient documentation

## 2022-05-10 DIAGNOSIS — M7989 Other specified soft tissue disorders: Secondary | ICD-10-CM | POA: Diagnosis not present

## 2022-05-10 DIAGNOSIS — Z79899 Other long term (current) drug therapy: Secondary | ICD-10-CM | POA: Insufficient documentation

## 2022-05-10 NOTE — Progress Notes (Signed)
[facility] [address]  [address]   Patient Care Team: Coral Spikes, DO as PCP - General (Family Medicine) Danie Binder, MD (Inactive) (Gastroenterology) Eloise Harman, DO as Consulting Physician (Gastroenterology)  SUMMARY OF ONCOLOGIC HISTORY: Oncology History  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[place])  02/28/2011 Initial Diagnosis   Right needle core biopsy demonstrating Invasive ductal carcinoma of right breast   03/21/2011 Surgery   Right lumpectomy demonstrating a 3.8 cm invasive ductal carcinoma, grade III, no LVI, and 0/1 lymph nodes   05/11/2011 - 07/13/2011 Chemotherapy   AC x 4   08/03/2011 - 10/19/2011 Chemotherapy   Paclitaxel weekly x 12   11/07/2011 - 12/31/2011 Radiation Therapy     02/10/2015 Procedure   Hysteroscopy with dilation and curettage by Dr. Josefa Half.   02/10/2015 Pathology Results   Diagnosis Endometrium, curettage - ABUNDANT BLOOD AND DEGENERATIVE SECRETORY ENDOMETRIUM WITH STROMAL BREAKDOWN (VERY LIMITED MATERIAL). - INFLAMED SQUAMOUS EPITHELIUM AND ENDOCERVICAL MUCOSA, NO DYSPLASIA OR MALIGNANCY.     CHIEF COMPLIANT: Follow-up for right breast cancer   INTERVAL HISTORY: [name] is a 56 y.o. female here today for follow up of her right breast cancer. Her last visit was on 05/04/2021.   Today she reports feeling good. She denies new pains, and she reports continued tenderness at her right lumpectomy site. She continues to take calcium and vitamin D.  She reports left arm swelling.   REVIEW OF SYSTEMS:   Review of Systems  Constitutional:  Positive for fatigue. Negative for appetite change.  HENT:   Positive for trouble swallowing.   Respiratory:  Positive for cough and shortness of breath.   Gastrointestinal:  Positive for constipation and nausea.  Genitourinary:  Positive for frequency.   Musculoskeletal:  Positive for arthralgias (7/10 leg) and back pain (7/10 lower).   Psychiatric/Behavioral:  Positive for sleep disturbance.   All other systems reviewed and are negative.   I have reviewed the past medical history, past surgical history, social history and family history with the patient and they are unchanged from previous note.   ALLERGIES:   is allergic to flagyl [metronidazole], lansoprazole, and pravastatin.   MEDICATIONS:  Current Outpatient Medications  Medication Sig Dispense Refill   atorvastatin (LIPITOR) 40 MG tablet TAKE 1 TABLET BY MOUTH AT BEDTIME 90 tablet 0   blood glucose meter kit and supplies Dispense based on patient and insurance preference. Use up to four times daily as directed. (FOR ICD-10 E10.9, E11.9). 1 each 5   cetirizine (ZYRTEC) 10 MG tablet Take 1 tablet by mouth once daily 30 tablet 0   citalopram (CELEXA) 40 MG tablet Take 1 tablet by mouth once daily 90 tablet 0   Dulaglutide (TRULICITY) 8.67 EH/2.0NO SOPN INJECT 0.75 MG SUBCUTANEOUSLY ONCE A WEEK 4 mL 0   DULoxetine (CYMBALTA) 30 MG capsule Take 1 capsule (30 mg total) by mouth daily. 30 capsule 5   fluticasone (FLONASE) 50 MCG/ACT nasal spray USE 2 SPRAY(S) IN EACH NOSTRIL ONCE DAILY AS NEEDED 16 g 0   HYDROcodone-acetaminophen (NORCO/VICODIN) 5-325 MG tablet Take 1 tablet by mouth every 8 (eight) hours as needed for severe pain. 90 tablet 0   lisinopril (ZESTRIL) 5 MG tablet Take 1 tablet (5 mg total) by mouth daily. 90 tablet 0   metFORMIN (GLUCOPHAGE-XR) 750 MG 24 hr tablet Take 1 tablet by mouth once daily with breakfast 90 tablet 0   Multiple Vitamin (MULTIVITAMIN) capsule Take 1 capsule  by mouth daily.     Omega-3 Fatty Acids (FISH OIL) 1000 MG CAPS Take 1,000 mg by mouth daily.     ondansetron (ZOFRAN-ODT) 8 MG disintegrating tablet DISSOLVE 1 TABLET IN MOUTH EVERY 8 HOURS AS NEEDED FOR NAUSEA OR VOMITING 20 tablet 0   pantoprazole (PROTONIX) 40 MG tablet TAKE 1 TABLET BY MOUTH TWICE DAILY BEFORE A MEAL 90 tablet 3   Vitamin D, Ergocalciferol, (DRISDOL) 1.25  MG (50000 UNIT) CAPS capsule Take 1 capsule by mouth once a week 12 capsule 0   No current facility-administered medications for this visit.     PHYSICAL EXAMINATION: Performance status (ECOG): 1 - Symptomatic but completely ambulatory  Vitals:   05/10/22 0759  BP: (!) 152/80  Pulse: 84  Resp: 18  Temp: 98.4 F (36.9 C)  SpO2: 97%   Wt Readings from Last 3 Encounters:  05/10/22 229 lb 9.6 oz (104.1 kg)  05/01/22 240 lb 6.4 oz (109 kg)  01/26/22 239 lb 9.6 oz (108.7 kg)   Physical Exam Vitals reviewed.  Constitutional:      Appearance: Normal appearance. She is obese.  Cardiovascular:     Rate and Rhythm: Normal rate and regular rhythm.     Pulses: Normal pulses.     Heart sounds: Normal heart sounds.  Pulmonary:     Effort: Pulmonary effort is normal.     Breath sounds: Normal breath sounds.  Chest:  Breasts:    Right: Skin change (lumpectomy scar above areola) present. No swelling, bleeding, inverted nipple, mass, nipple discharge or tenderness.     Left: No swelling, bleeding, inverted nipple, mass, nipple discharge, skin change or tenderness.  Abdominal:     Palpations: Abdomen is soft. There is no hepatomegaly, splenomegaly or mass.     Tenderness: There is no abdominal tenderness.  Lymphadenopathy:     Upper Body:     Right upper body: No supraclavicular or axillary adenopathy.     Left upper body: No supraclavicular or axillary adenopathy.  Neurological:     General: No focal deficit present.     Mental Status: She is alert and oriented to person, place, and time.  Psychiatric:        Mood and Affect: Mood normal.        Behavior: Behavior normal.     Breast Exam Chaperone: Thana Ates     LABORATORY DATA:  I have reviewed the data as listed    Latest Ref Rng & Units 05/01/2022    4:30 PM 10/19/2021    8:20 AM 08/23/2021    8:22 AM  CMP  Glucose 70 - 99 mg/dL 102  113  106   BUN 6 - 20 mg/dL $Remove'14  9  13   'mvYXNey$ Creatinine 0.44 - 1.00 mg/dL 0.93  0.89   0.77   Sodium 135 - 145 mmol/L 137  142  138   Potassium 3.5 - 5.1 mmol/L 4.0  4.2  3.9   Chloride 98 - 111 mmol/L 102  101  102   CO2 22 - 32 mmol/L $RemoveB'28  26  27   'gbYWHOhP$ Calcium 8.9 - 10.3 mg/dL 9.1  8.8  9.1   Total Protein 6.5 - 8.1 g/dL 8.1  6.9    Total Bilirubin 0.3 - 1.2 mg/dL 0.6  0.3    Alkaline Phos 38 - 126 U/L 55  76    AST 15 - 41 U/L 23  15    ALT 0 - 44 U/L 24  20  No results found for: "CAN153" Lab Results  Component Value Date   WBC DUPLICATE REQUEST 29/56/2130   HGB DUPLICATE REQUEST 86/57/8469   HCT DUPLICATE REQUEST 62/95/2841   MCV DUPLICATE REQUEST 32/44/0102   PLT DUPLICATE REQUEST 72/53/6644   NEUTROABS 4.5 05/02/2022    ASSESSMENT:  1.  Stage II (T2N0) right breast IDC: -Diagnosed in April 2012, ER negative, PR 2% positive, Ki-67 90%, HER-2 negative. -Status post lumpectomy followed by adjuvant chemotherapy with AC x4 followed by 12 weekly paclitaxel completed on 10/19/2011. -Adjuvant breast radiation completed on 12/31/2011. -Tamoxifen was likely not recommended because of low positivity for PR. -Mammogram on 04/25/2020 was BI-RADS 2 category.   PLAN:  1.  Stage II (T2N0) right breast IDC: - Physical exam today shows right lumpectomy site is within normal limits above the areola.  Right breast is small in size.  Left breast has no palpable masses.  No palpable adenopathy. - Reviewed mammogram from 05/03/2022, BI-RADS Category 1. - Labs from 05/01/2022 shows normal LFTs and creatinine.  CBC was normal. - Recommend follow-up in 1 year with repeat labs and mammogram.   2.  Neuropathy: - Numbness in the extremities is stable.  No indication for gabapentin.   3.  Vitamin D deficiency: - Vitamin D is 79.  Continue vitamin D 50,000 units weekly.  Breast Cancer therapy associated bone loss: I have recommended calcium, Vitamin D and weight bearing exercises.  Orders placed this encounter:  Orders Placed This Encounter  Procedures   MM 3D SCREEN BREAST  BILATERAL    The patient has a good understanding of the overall plan. She agrees with it. She will call with any problems that may develop before the next visit here.  Derek Jack, MD [place] [phone]   I, Thana Ates, am acting as a scribe for Dr. Derek Jack.  I, Derek Jack MD, have reviewed the above documentation for accuracy and completeness, and I agree with the above.

## 2022-05-10 NOTE — Patient Instructions (Addendum)
[place] at [facility] Discharge Instructions   You were seen and examined today by Dr. Delton Coombes.  He reviewed the results of your lab work which are normal.  Continue calcium + vitamin D supplements.   We will see you back in 1 year. We will repeat mammogram and lab work prior to your next visit.    Thank you for choosing [place] at [facility] to provide your oncology and hematology care.  To afford each patient quality time with our provider, please arrive at least 15 minutes before your scheduled appointment time.   If you have a lab appointment with the [facility] please come in thru the Main Entrance and check in at the main information desk.  You need to re-schedule your appointment should you arrive 10 or more minutes late.  We strive to give you quality time with our providers, and arriving late affects you and other patients whose appointments are after yours.  Also, if you no show three or more times for appointments you may be dismissed from the clinic at the providers discretion.     Again, thank you for choosing [facility].  Our hope is that these requests will decrease the amount of time that you wait before being seen by our physicians.       _____________________________________________________________  Should you have questions after your visit to [facility], please contact our office at [phone] and follow the prompts.  Our office hours are 8:00 a.m. and 4:30 p.m. Monday - Friday.  Please note that voicemails left after 4:00 p.m. may not be returned until the following business day.  We are closed weekends and major holidays.  You do have access to a nurse 24-7, just call the main number to the clinic [phone] and do not press any options, hold on the line and a nurse will answer the phone.    For prescription refill requests, have your pharmacy contact our office and allow 72  hours.    Due to Covid, you will need to wear a mask upon entering the hospital. If you do not have a mask, a mask will be given to you at the Main Entrance upon arrival. For doctor visits, patients may have 1 support person age 9 or older with them. For treatment visits, patients can not have anyone with them due to social distancing guidelines and our immunocompromised population.

## 2022-05-11 ENCOUNTER — Encounter: Payer: Self-pay | Admitting: Physical Medicine and Rehabilitation

## 2022-05-11 ENCOUNTER — Encounter: Payer: 59 | Attending: Physical Medicine and Rehabilitation | Admitting: Physical Medicine and Rehabilitation

## 2022-05-11 VITALS — BP 122/83 | HR 87 | Ht 63.0 in | Wt 242.0 lb

## 2022-05-11 DIAGNOSIS — Z79891 Long term (current) use of opiate analgesic: Secondary | ICD-10-CM | POA: Insufficient documentation

## 2022-05-11 DIAGNOSIS — Z5181 Encounter for therapeutic drug level monitoring: Secondary | ICD-10-CM | POA: Diagnosis present

## 2022-05-11 DIAGNOSIS — G894 Chronic pain syndrome: Secondary | ICD-10-CM | POA: Diagnosis present

## 2022-05-11 DIAGNOSIS — F0631 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: Secondary | ICD-10-CM | POA: Diagnosis present

## 2022-05-11 MED ORDER — HYDROCODONE-ACETAMINOPHEN 5-325 MG PO TABS: 1.0000 | ORAL_TABLET | Freq: Three times a day (TID) | ORAL | 0 refills | Status: DC | PRN

## 2022-05-11 NOTE — Progress Notes (Signed)
Subjective:    Patient ID: [name], female    DOB: 1966/09/03, 56 y.o.   MRN: [id number]  HPI Pt is a 56 yr old female with hx of DM,- A1c 6.0;  Anxiety, R breast invasive ductal cancer of R breast in 2012 neuropathy in hands and feet; and arthritis and pain in legs.  Also hx of lumbar surgery- been on Norco for "years".    Here for f/u on chronic pain.   Sister died 2 weeks ago- breast cancer that metastasized. And C Diff. Was 56 yrs old.   Missed last appointment- got COVID- couldn't hardly swallow or get OOB.  Out of work for 1 week- no energy.  Doing OK now.    Didn't talk to Ortho about R knee injections.  L upper arm swollen and won't go away.  Cancer was on R side- and swelling didn't occur until has fx of LUE-  Still cannot lift LUE higher than ~ 60 degrees  R knee still bothers her, but not as bad as it was.   Couldn't keep eyes open when given Klonopin for Anxiety after sister died 2 weeks ago.    Pain Inventory Average Pain 10 Pain Right Now 7 My pain is sharp, stabbing, and aching  In the last 24 hours, has pain interfered with the following? General activity 4 Relation with others 4 Enjoyment of life 4 What TIME of day is your pain at its worst? morning  and night Sleep (in general) Fair  Pain is worse with: walking, bending, sitting, inactivity, and standing Pain improves with: rest and medication Relief from Meds: 7  Family History  Problem Relation Age of Onset   Colon cancer Mother        mid-50s, died of metastatic disease   Cancer Mother        liver   Coronary artery disease Mother    Diabetes type I Mother    Peripheral vascular disease Mother        Carotid disease in her 51s   Coronary artery disease Father        CAD in his 65s   Diabetes Father    Diabetes type I Father    Hypertension Father    Bipolar disorder Sister    Coronary artery disease Brother        MI in his 41s   Hypertension Brother    Hypertension  Maternal Grandmother    Hyperlipidemia Maternal Grandmother    Stroke Maternal Grandmother    Hypertension Maternal Grandfather    Diabetes Maternal Grandfather    Diabetes Paternal Grandmother    Heart disease Paternal Grandfather    Social History   Socioeconomic History   Marital status: Married    Spouse name: Not on file   Number of children: 3   Years of education: Not on file   Highest education level: Not on file  Occupational History   Occupation: child care    Employer: [employer]  Tobacco Use   Smoking status: Never   Smokeless tobacco: Never  Vaping Use   Vaping Use: Never used  Substance and Sexual Activity   Alcohol use: No    Alcohol/week: 0.0 standard drinks of alcohol   Drug use: No   Sexual activity: Yes    Partners: Male    Birth control/protection: None  Other Topics Concern   Not on file  Social History Narrative   Not on file   Social Determinants of Health  Financial Resource Strain: Not on file  Food Insecurity: Not on file  Transportation Needs: Not on file  Physical Activity: Not on file  Stress: Not on file  Social Connections: Not on file   Past Surgical History:  Procedure Laterality Date   back surg x2     BALLOON DILATION N/A 09/22/2021   Procedure: BALLOON DILATION;  Surgeon: Lanelle Bal, DO;  [facility];  Service: Endoscopy;  Laterality: N/A;   BIOPSY  09/22/2021   Procedure: BIOPSY;  Surgeon: Lanelle Bal, DO;  [facility];  Service: Endoscopy;;   BREAST LUMPECTOMY Right 2012   CHOLECYSTECTOMY     COLONOSCOPY  11/03/07   4-mm sessile polyp removed/small internal hemorrhoids/tubular adenoma, random colon bx negative for microscopic colitis   COLONOSCOPY WITH PROPOFOL N/A 09/17/2016   Grade 2 hemorrhoids, colonic diverticulosis, ascending colon polyp (sessile serrated adenoma), 5 year surveillance   COLONOSCOPY WITH PROPOFOL N/A 09/22/2021   Procedure: COLONOSCOPY WITH PROPOFOL;   Surgeon: Lanelle Bal, DO;  [facility];  Service: Endoscopy;  Laterality: N/A;  7:30am   COLONOSCOPY, ESOPHAGOGASTRODUODENOSCOPY (EGD) AND ESOPHAGEAL DILATION  01/2012   mild gastritis s/p biopsy. Empiric dilation. Normal duodenum.    DILATATION & CURETTAGE/HYSTEROSCOPY WITH MYOSURE N/A 02/10/2015   Procedure: DILATATION & CURETTAGE/HYSTEROSCOPY WITH MYOSURE;  Surgeon: Patton Salles, MD;  Location: [facility];  Service: Gynecology;  Laterality: N/A;   ESOPHAGOGASTRODUODENOSCOPY (EGD) WITH PROPOFOL N/A 01/11/2020   Normal esophagus, s/p dilation, normal stomach, normal duodenum.    ESOPHAGOGASTRODUODENOSCOPY (EGD) WITH PROPOFOL N/A 09/22/2021   Procedure: ESOPHAGOGASTRODUODENOSCOPY (EGD) WITH PROPOFOL;  Surgeon: Lanelle Bal, DO;  [facility];  Service: Endoscopy;  Laterality: N/A;   MALONEY DILATION N/A 01/11/2020   Procedure: Elease Hashimoto DILATION;  Surgeon: Corbin Ade, MD;  [facility];  Service: Endoscopy;  Laterality: N/A;   MM BREAST STEREO BX*L*R/S     rt.   POLYPECTOMY  09/17/2016   Procedure: POLYPECTOMY;  Surgeon: Corbin Ade, MD;  [facility];  Service: Endoscopy;;  ascending colon   POLYPECTOMY  09/22/2021   Procedure: POLYPECTOMY;  Surgeon: Lanelle Bal, DO;  [facility];  Service: Endoscopy;;   PORT-A-CATH REMOVAL  05/26/2012   Procedure: REMOVAL PORT-A-CATH;  Surgeon: [name], MD;  Location: [facility];  Service: General;  Laterality: N/A;  Minor Room   PORTACATH PLACEMENT     Past Surgical History:  Procedure Laterality Date   back surg x2     BALLOON DILATION N/A 09/22/2021   Procedure: BALLOON DILATION;  Surgeon: Lanelle Bal, DO;  [facility];  Service: Endoscopy;  Laterality: N/A;   BIOPSY  09/22/2021   Procedure: BIOPSY;  Surgeon: Lanelle Bal, DO;  [facility];  Service: Endoscopy;;   BREAST LUMPECTOMY Right 2012   CHOLECYSTECTOMY     COLONOSCOPY   11/03/07   4-mm sessile polyp removed/small internal hemorrhoids/tubular adenoma, random colon bx negative for microscopic colitis   COLONOSCOPY WITH PROPOFOL N/A 09/17/2016   Grade 2 hemorrhoids, colonic diverticulosis, ascending colon polyp (sessile serrated adenoma), 5 year surveillance   COLONOSCOPY WITH PROPOFOL N/A 09/22/2021   Procedure: COLONOSCOPY WITH PROPOFOL;  Surgeon: Lanelle Bal, DO;  [facility];  Service: Endoscopy;  Laterality: N/A;  7:30am   COLONOSCOPY, ESOPHAGOGASTRODUODENOSCOPY (EGD) AND ESOPHAGEAL DILATION  01/2012   mild gastritis s/p biopsy. Empiric dilation. Normal duodenum.    DILATATION &  CURETTAGE/HYSTEROSCOPY WITH MYOSURE N/A 02/10/2015   Procedure: DILATATION & CURETTAGE/HYSTEROSCOPY WITH MYOSURE;  Surgeon: Patton Salles, MD;  Location: [facility];  Service: Gynecology;  Laterality: N/A;   ESOPHAGOGASTRODUODENOSCOPY (EGD) WITH PROPOFOL N/A 01/11/2020   Normal esophagus, s/p dilation, normal stomach, normal duodenum.    ESOPHAGOGASTRODUODENOSCOPY (EGD) WITH PROPOFOL N/A 09/22/2021   Procedure: ESOPHAGOGASTRODUODENOSCOPY (EGD) WITH PROPOFOL;  Surgeon: Lanelle Bal, DO;  [facility];  Service: Endoscopy;  Laterality: N/A;   MALONEY DILATION N/A 01/11/2020   Procedure: Elease Hashimoto DILATION;  Surgeon: Corbin Ade, MD;  [facility];  Service: Endoscopy;  Laterality: N/A;   MM BREAST STEREO BX*L*R/S     rt.   POLYPECTOMY  09/17/2016   Procedure: POLYPECTOMY;  Surgeon: Corbin Ade, MD;  [facility];  Service: Endoscopy;;  ascending colon   POLYPECTOMY  09/22/2021   Procedure: POLYPECTOMY;  Surgeon: Lanelle Bal, DO;  [facility];  Service: Endoscopy;;   PORT-A-CATH REMOVAL  05/26/2012   Procedure: REMOVAL PORT-A-CATH;  Surgeon: [name], MD;  Location: [facility];  Service: General;  Laterality: N/A;  Minor Room   PORTACATH PLACEMENT     Past Medical History:  Diagnosis Date   Anxiety     Arthritis    Blood transfusion without reported diagnosis 1999   due to heavy menses   Breast cancer (HCC) 05/22/2011   03/01/11, Stage 2, s/p lumpectomy, chemo/xrt   Complication of anesthesia    pt woke up during procedure   DDD (degenerative disc disease), lumbar    Depression 06/22/2011   Diverticulitis    DM (diabetes mellitus) (HCC) 06/22/2011   Genital warts    GERD (gastroesophageal reflux disease) 06/22/2011   Hx of adenomatous colonic polyps 10/2007   4mm sigmoid tubular adenoma, FH colon cancer, mother in mid-50s   Hypercholesterolemia 06/22/2011   Hypertension 06/22/2011   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   Iron deficiency anemia 03/25/2015   Morbid obesity (HCC)    Shortness of breath    STD (sexually transmitted disease)    HPV, 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   Vaginal bleeding 03/08/2014   BP 122/83   Pulse 87   Ht 5\' 3"  (1.6 m)   Wt 242 lb (109.8 kg)   LMP 04/05/2016 Comment: spotting 11/2016 and 12/2016  SpO2 92%   BMI 42.87 kg/m   Opioid Risk Score:   Fall Risk Score:  `1  Depression screen [facility] 2/9     10/20/2021    3:10 PM 07/14/2021    3:19 PM 05/05/2021    2:37 PM 04/14/2021    1:11 PM 10/04/2020    2:52 PM 06/08/2017   11:26 PM  Depression screen PHQ 2/9  Decreased Interest 3 1 3 2 1 1   Down, Depressed, Hopeless 3 1 3 2 1 1   PHQ - 2 Score 6 2 6 4 2 2   Altered sleeping   1 2 1 1   Tired, decreased energy   3 2 2 1   Change in appetite   1 1 0 1  Feeling bad or failure about yourself    1 1 1  0  Trouble concentrating   2 3 1 1   Moving slowly or fidgety/restless   1 3 2  0  Suicidal thoughts   1 0 0   PHQ-9 Score   16 16 9 6   Difficult doing work/chores    Extremely dIfficult Somewhat difficult Somewhat difficult  Review of Systems  Musculoskeletal:  Positive for back pain and neck pain.       Bilateral leg pain Bilateral hip pain Bilateral shoulder pain  All other systems reviewed and are negative.      Objective:   Physical Exam   Can lift LUE ~ 60 degrees total in abduction and flexion At shoulder Also has significant swelling of LUE- esp around medial aspect of L upper arm, but entire arm is affected- nonpitting edema even notable in L forearm as well- larger circumference of LUE than RUE- ~ 1-2 inches difference.      Assessment & Plan:   Pt is a 56 yr old female with hx of DM,- A1c 6.0;  Anxiety, R breast invasive ductal cancer of R breast in 2012 neuropathy in hands and feet; and arthritis and pain in legs.  Also hx of lumbar surgery- been on Norco for "years".  Grief reaction and depression over death of sister.    Here for f/u on chronic pain.  Increase Norco to 5/325 mg 4x/day as needed for pain- #120-  since was going to do at last visit, but somehow didn't do.  2.  Needs ot see Ortho about swelling of LUE since UE fx-  and for R knee steroid injection.   3. Keeping Cymbalta at 40 mg daily- since also on Celexa.    4.  Stay off Klonopin if possible. Really can affect sleepiness severely.    5. There is a [facility] psychologist of controlled pain meds right now. Explained if they don't have it in stock, then will need to call around and let us know where to send it.     6. F/U in 3 months for chronic pain  7. UDS today- already done   I spent a total of 23   minutes on total care today- >50% coordination of care- due to discussion about increasing pain meds as well as discussion about grief of sister passing away.

## 2022-05-11 NOTE — Patient Instructions (Signed)
Pt is a 56 yr old female with hx of DM,- A1c 6.0;  Anxiety, R breast invasive ductal cancer of R breast in 2012 neuropathy in hands and feet; and arthritis and pain in legs.  Also hx of lumbar surgery- been on Norco for "years".  Grief reaction and depression over death of sister.    Here for f/u on chronic pain.  Increase Norco to 5/325 mg 4x/day as needed for pain- #120-  since was going to do at last visit, but somehow didn't do.  2.  Needs ot see Ortho about swelling of LUE since UE fx-  and for R knee steroid injection.   3. Keeping Cymbalta at 40 mg daily- since also on Celexa.    4.  Stay off Klonopin if possible. Really can affect sleepiness severely.    5. There is a [facility] and exercise psychologist of controlled pain meds right now. Explained if they don't have it in stock, then will need to call around and let us know where to send it.     6. F/U in 3 months for chronic pain

## 2022-05-14 ENCOUNTER — Telehealth: Payer: Self-pay

## 2022-05-14 MED ORDER — HYDROCODONE-ACETAMINOPHEN 5-325 MG PO TABS: 1.0000 | ORAL_TABLET | Freq: Four times a day (QID) | ORAL | 0 refills | Status: DC | PRN

## 2022-05-14 NOTE — Telephone Encounter (Signed)
Patient has requested a new script to be sent to [facility]. The new  script should state Hydrocodone 5-325 MG,  taken 4 tablets daily.   Thank you. ZO#[id number].

## 2022-05-15 ENCOUNTER — Ambulatory Visit (INDEPENDENT_AMBULATORY_CARE_PROVIDER_SITE_OTHER): Payer: 59 | Admitting: Family Medicine

## 2022-05-15 ENCOUNTER — Encounter: Payer: Self-pay | Admitting: Family Medicine

## 2022-05-15 VITALS — BP 98/70 | HR 94 | Temp 97.3°F | Wt 241.0 lb

## 2022-05-15 DIAGNOSIS — I10 Essential (primary) hypertension: Secondary | ICD-10-CM | POA: Diagnosis not present

## 2022-05-15 DIAGNOSIS — R0789 Other chest pain: Secondary | ICD-10-CM

## 2022-05-15 DIAGNOSIS — D1722 Benign lipomatous neoplasm of skin and subcutaneous tissue of left arm: Secondary | ICD-10-CM

## 2022-05-15 DIAGNOSIS — R55 Syncope and collapse: Secondary | ICD-10-CM

## 2022-05-15 DIAGNOSIS — I447 Left bundle-branch block, unspecified: Secondary | ICD-10-CM

## 2022-05-15 NOTE — Patient Instructions (Signed)
Stop lisinopril.  I have placed a referral to cardiology.  Follow-up in 3 months.  Take care  Dr. Adriana Simas

## 2022-05-16 ENCOUNTER — Other Ambulatory Visit: Payer: Self-pay | Admitting: Family Medicine

## 2022-05-16 ENCOUNTER — Ambulatory Visit: Payer: 59 | Admitting: Family Medicine

## 2022-05-16 DIAGNOSIS — D179 Benign lipomatous neoplasm, unspecified: Secondary | ICD-10-CM | POA: Insufficient documentation

## 2022-05-16 NOTE — Assessment & Plan Note (Signed)
Patient has a large lipoma left upper arm.  Discussed referral to general surgery for removal.  Patient states that she will consider

## 2022-05-16 NOTE — Assessment & Plan Note (Signed)
BP 98/70 today.  Stopping lisinopril.

## 2022-05-16 NOTE — Progress Notes (Signed)
Subjective:  Patient ID: [name], female    DOB: [birth year]  Age: 56 y.o. MRN: [id number]  CC: Chief Complaint  Patient presents with   Follow Up    Pt here for follow up. Pt states she has had some chest pains and shortness of breath recently. Blood pressure drops when pt feels funny. BP at [facility] systolic 030. Pt would like to discuss area on upper left arm. Feels funny when pt touches it; "feels like a big mass"    HPI:  56 year old female with a history of obesity, GERD, fatty liver, type 2 diabetes, mood disorder hyperlipidemia chronic pain syndrome, recent syncope presents for follow-up.  Patient has had no additional episodes of syncope.  She does states that she has some occasional left-sided chest pain which resolves within a few seconds.  It is sharp in nature.  She also reports shortness of breath.  She has a history of left bundle branch block.  Patient's BP low today.  We will need to discuss her blood pressure medications.  Patient also reports that she has a large lump or mass on her left upper arm.  It is not painful but is displeasing cosmetically.  She would like me to examine this today.  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   Fatty liver 09/06/2016   H/O adenomatous polyp of colon 01/24/2012   FH: colon cancer 01/24/2012   GERD (gastroesophageal reflux disease) 06/22/2011   Hyperlipidemia 06/22/2011   Essential hypertension 06/22/2011   Obesity 06/22/2011   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[place]) 05/22/2011    Social Hx   Social History   Socioeconomic History   Marital status: Married    Spouse name: Not on file   Number of children: 3   Years of  education: Not on file   Highest education level: Not on file  Occupational History   Occupation: child care    Employer: [employer]  Tobacco Use   Smoking status: Never   Smokeless tobacco: Never  Vaping Use   Vaping Use: Never used  Substance and Sexual Activity   Alcohol use: No    Alcohol/week: 0.0 standard drinks of alcohol   Drug use: No   Sexual activity: Yes    Partners: Male    Birth control/protection: None  Other Topics Concern   Not on file  Social History Narrative   Not on file   Social Determinants of Health   Financial Resource Strain: Not on file  Food Insecurity: Not on file  Transportation Needs: Not on file  Physical Activity: Not on file  Stress: Not on file  Social Connections: Not on file    Review of Systems Per HPI  Objective:  BP 98/70   Pulse 94   Temp (!) 97.3 F (36.3 C)   Wt 241 lb (109.3 kg)   LMP 04/05/2016 Comment: spotting 11/2016 and 12/2016  SpO2 94%   BMI 42.69 kg/m      05/15/2022    2:14 PM 05/11/2022    3:00 PM 05/10/2022    7:59 AM  BP/Weight  Systolic BP 98 622 633  Diastolic BP 70 83 80  Wt. (Lbs) 241 242 229.6  BMI 42.69 kg/m2 42.87 kg/m2 40.67 kg/m2    Physical Exam Constitutional:      General: She is not in acute distress.    Appearance: She is obese.  HENT:     Head: Normocephalic and atraumatic.  Eyes:     General:        Right eye: No discharge.        Left eye: No discharge.     Conjunctiva/sclera: Conjunctivae normal.  Cardiovascular:     Rate and Rhythm: Normal rate and regular rhythm.  Pulmonary:     Effort: Pulmonary effort is normal.     Breath sounds: Normal breath sounds. No wheezing, rhonchi or rales.  Skin:    Comments: Left upper arm with a large mass which is soft to palpation.  This is consistent with a lipoma.  Neurological:     Mental Status: She is alert.  Psychiatric:        Mood and Affect: Mood normal.        Behavior: Behavior normal.     Lab Results   Component Value Date   WBC DUPLICATE REQUEST 90/24/0973   HGB DUPLICATE REQUEST 53/29/9242   HCT DUPLICATE REQUEST 68/34/1962   PLT DUPLICATE REQUEST 22/97/9892   GLUCOSE 102 (H) 05/01/2022   CHOL 158 10/19/2021   TRIG 147 10/19/2021   HDL 44 10/19/2021   LDLCALC 88 10/19/2021   ALT 24 05/01/2022   AST 23 05/01/2022   NA 137 05/01/2022   K 4.0 05/01/2022   CL 102 05/01/2022   CREATININE 0.93 05/01/2022   BUN 14 05/01/2022   CO2 28 05/01/2022   TSH 2.810 12/14/2017   HGBA1C 6.1 (H) 10/19/2021   MICROALBUR 1.2 10/20/2018     Assessment & Plan:   Problem List Items Addressed This Visit       Cardiovascular and Mediastinum   LBBB (left bundle branch block)   Relevant Orders   Ambulatory referral to Cardiology   Essential hypertension    BP 98/70 today.  Stopping lisinopril.        Other   Syncope - Primary    No additional episodes.  Patient is quite concerned.  Given her history of syncope as well as left bundle branch block and reported bouts of brief chest pain I am sending her to cardiology for further evaluation.      Relevant Orders   Ambulatory referral to Cardiology   Lipoma    Patient has a large lipoma left upper arm.  Discussed referral to general surgery for removal.  Patient states that she will consider      Other Visit Diagnoses     Atypical chest pain       Relevant Orders   Ambulatory referral to Cardiology      Follow-up:  Return in about 3 months (around 08/15/2022).  [place]

## 2022-05-16 NOTE — Assessment & Plan Note (Signed)
No additional episodes.  Patient is quite concerned.  Given her history of syncope as well as left bundle branch block and reported bouts of brief chest pain I am sending her to cardiology for further evaluation.

## 2022-05-17 ENCOUNTER — Telehealth: Payer: Self-pay | Admitting: *Deleted

## 2022-05-17 LAB — TOXASSURE SELECT,+ANTIDEPR,UR

## 2022-05-17 NOTE — Telephone Encounter (Signed)
Urine drug screen for this encounter is consistent for prescribed medication 

## 2022-05-21 ENCOUNTER — Other Ambulatory Visit: Payer: Self-pay | Admitting: Family Medicine

## 2022-05-21 DIAGNOSIS — J302 Other seasonal allergic rhinitis: Secondary | ICD-10-CM

## 2022-05-23 ENCOUNTER — Other Ambulatory Visit: Payer: Self-pay | Admitting: Family Medicine

## 2022-05-23 DIAGNOSIS — E1165 Type 2 diabetes mellitus with hyperglycemia: Secondary | ICD-10-CM

## 2022-05-25 ENCOUNTER — Other Ambulatory Visit: Payer: Self-pay | Admitting: Family Medicine

## 2022-05-25 DIAGNOSIS — J302 Other seasonal allergic rhinitis: Secondary | ICD-10-CM

## 2022-06-01 ENCOUNTER — Other Ambulatory Visit: Payer: Self-pay | Admitting: Family Medicine

## 2022-06-19 ENCOUNTER — Telehealth: Payer: Self-pay | Admitting: Family Medicine

## 2022-06-19 NOTE — Telephone Encounter (Signed)
Nurse- Patient is wanting all her medications switched to [facility] and a copy of her current medications sent to them/

## 2022-06-19 NOTE — Telephone Encounter (Signed)
Current med list faxed over to [facility]. Sent my chart message to patient asking if she needs refills; no refills needed but states we can send a list over to [facility].

## 2022-06-25 ENCOUNTER — Other Ambulatory Visit: Payer: Self-pay | Admitting: Family Medicine

## 2022-06-25 DIAGNOSIS — F324 Major depressive disorder, single episode, in partial remission: Secondary | ICD-10-CM

## 2022-06-25 DIAGNOSIS — J302 Other seasonal allergic rhinitis: Secondary | ICD-10-CM

## 2022-06-25 DIAGNOSIS — E1165 Type 2 diabetes mellitus with hyperglycemia: Secondary | ICD-10-CM

## 2022-06-25 MED ORDER — TRULICITY 0.75 MG/0.5ML ~~LOC~~ SOAJ: SUBCUTANEOUS | 0 refills | Status: DC

## 2022-06-25 MED ORDER — FLUTICASONE PROPIONATE 50 MCG/ACT NA SUSP: NASAL | 0 refills | Status: DC

## 2022-06-25 MED ORDER — ONDANSETRON 8 MG PO TBDP: ORAL_TABLET | ORAL | 0 refills | Status: DC

## 2022-06-25 MED ORDER — CETIRIZINE HCL 10 MG PO TABS: 10.0000 mg | ORAL_TABLET | Freq: Every day | ORAL | 0 refills | Status: DC

## 2022-06-25 MED ORDER — ATORVASTATIN CALCIUM 40 MG PO TABS: 40.0000 mg | ORAL_TABLET | Freq: Every day | ORAL | 0 refills | Status: DC

## 2022-06-25 MED ORDER — CITALOPRAM HYDROBROMIDE 40 MG PO TABS: 40.0000 mg | ORAL_TABLET | Freq: Every day | ORAL | 0 refills | Status: DC

## 2022-06-25 MED ORDER — METFORMIN HCL ER 750 MG PO TB24: 750.0000 mg | ORAL_TABLET | Freq: Every day | ORAL | 0 refills | Status: DC

## 2022-06-26 ENCOUNTER — Ambulatory Visit: Payer: 59 | Admitting: Cardiology

## 2022-06-27 ENCOUNTER — Encounter (HOSPITAL_COMMUNITY): Payer: Self-pay

## 2022-06-27 ENCOUNTER — Emergency Department (HOSPITAL_COMMUNITY)
Admission: EM | Admit: 2022-06-27 | Discharge: 2022-06-27 | Disposition: A | Discharge status: Home / Self Care | Payer: 59 | Attending: Emergency Medicine | Admitting: Emergency Medicine

## 2022-06-27 DIAGNOSIS — W57XXXA Bitten or stung by nonvenomous insect and other nonvenomous arthropods, initial encounter: Secondary | ICD-10-CM | POA: Insufficient documentation

## 2022-06-27 DIAGNOSIS — S20469A Insect bite (nonvenomous) of unspecified back wall of thorax, initial encounter: Secondary | ICD-10-CM | POA: Diagnosis present

## 2022-06-27 DIAGNOSIS — S40862A Insect bite (nonvenomous) of left upper arm, initial encounter: Secondary | ICD-10-CM | POA: Diagnosis not present

## 2022-06-27 MED ORDER — FAMOTIDINE 20 MG PO TABS
20.0000 mg | ORAL_TABLET | Freq: Once | ORAL | Status: AC
  Administered 2022-06-27: 20 mg via ORAL
  Filled 2022-06-27: qty 1

## 2022-06-27 MED ORDER — DIPHENHYDRAMINE HCL 25 MG PO CAPS
25.0000 mg | ORAL_CAPSULE | Freq: Once | ORAL | Status: AC
  Administered 2022-06-27: 25 mg via ORAL
  Filled 2022-06-27: qty 1

## 2022-06-27 MED ORDER — PREDNISONE 50 MG PO TABS
60.0000 mg | ORAL_TABLET | Freq: Once | ORAL | Status: AC
  Administered 2022-06-27: 60 mg via ORAL
  Filled 2022-06-27: qty 1

## 2022-06-27 MED ORDER — PREDNISONE 20 MG PO TABS
20.0000 mg | ORAL_TABLET | Freq: Two times a day (BID) | ORAL | 0 refills | Status: DC
Start: 2022-06-27 — End: 2022-07-03

## 2022-06-27 NOTE — ED Notes (Signed)
Pt with burning to area bit by unknown insect to left axillary, mild redness noted

## 2022-06-27 NOTE — Discharge Instructions (Signed)
We sent a prednisone prescription to your pharmacy.  Also take Benadryl 25 mg 4 times a day, and Pepcid 20 mg twice a day for 5 days.  Return here or see your doctor for problems

## 2022-06-27 NOTE — ED Notes (Signed)
ED Provider at bedside. 

## 2022-06-27 NOTE — ED Provider Notes (Signed)
[facility] EMERGENCY DEPARTMENT Provider Note   CSN: [id number] Arrival date & time: 06/27/22  1750     History {Add pertinent medical, surgical, social history, OB history to HPI:1} Chief Complaint  Patient presents with   Insect Bite    [name] is a 56 y.o. female.  HPI She states that about an hour and a half ago she felt a sudden stinging sensation over her left upper back.  She became worried that it was a spider so decided to come here for checkup.  She had her son drawn area around the spot.  She denies shortness of breath.    Home Medications Prior to Admission medications   Medication Sig Start Date End Date Taking? Authorizing Provider  atorvastatin (LIPITOR) 40 MG tablet Take 1 tablet (40 mg total) by mouth at bedtime. 06/25/22   Coral Spikes, DO  blood glucose meter kit and supplies Dispense based on patient and insurance preference. Use up to four times daily as directed. (FOR ICD-10 E10.9, E11.9). 01/05/20   Mikey Kirschner, MD  cetirizine (ZYRTEC) 10 MG tablet Take 1 tablet (10 mg total) by mouth daily. 06/25/22   Coral Spikes, DO  citalopram (CELEXA) 40 MG tablet Take 1 tablet (40 mg total) by mouth daily. 06/25/22   Coral Spikes, DO  Dulaglutide (TRULICITY) 9.62 EZ/6.6QH SOPN INJECT 0.75 MG  SUBCUTANEOUSLY ONCE A WEEK 06/25/22   Thersa Salt G, DO  DULoxetine (CYMBALTA) 30 MG capsule Take 1 capsule (30 mg total) by mouth daily. 05/03/22   Lovorn, Jinny Blossom, MD  fluticasone (FLONASE) 50 MCG/ACT nasal spray USE 2 SPRAY(S) IN EACH NOSTRIL ONCE DAILY AS NEEDED 06/25/22   Coral Spikes, DO  HYDROcodone-acetaminophen (NORCO/VICODIN) 5-325 MG tablet Take 1 tablet by mouth every 6 (six) hours as needed for severe pain. 05/14/22   Lovorn, Jinny Blossom, MD  metFORMIN (GLUCOPHAGE-XR) 750 MG 24 hr tablet Take 1 tablet (750 mg total) by mouth daily with breakfast. 06/25/22   Coral Spikes, DO  Multiple Vitamin (MULTIVITAMIN) capsule Take 1 capsule by mouth daily.    [provider]   Omega-3 Fatty Acids (FISH OIL) 1000 MG CAPS Take 1,000 mg by mouth daily.    [provider]  ondansetron (ZOFRAN-ODT) 8 MG disintegrating tablet DISSOLVE 1 TABLET IN MOUTH EVERY 8 HOURS AS NEEDED FOR NAUSEA OR VOMITING 06/25/22   Thersa Salt G, DO  pantoprazole (PROTONIX) 40 MG tablet TAKE 1 TABLET BY MOUTH TWICE DAILY BEFORE A MEAL 04/02/22   Annitta Needs, NP  Vitamin D, Ergocalciferol, (DRISDOL) 1.25 MG (50000 UNIT) CAPS capsule Take 1 capsule by mouth once a week 04/02/22   Derek Jack, MD      Allergies    Flagyl [metronidazole], Lansoprazole, and Pravastatin    Review of Systems   Review of Systems  Physical Exam Updated Vital Signs BP (!) 149/91 (BP Location: Left Wrist)   Pulse (!) 114   Temp 98 F (36.7 C) (Oral)   Resp 16   Ht $R'5\' 4"'oy$  (1.626 m)   Wt 109.2 kg   LMP 04/05/2016 Comment: spotting 11/2016 and 12/2016  SpO2 99%   BMI 41.32 kg/m  Physical Exam Vitals and nursing note reviewed.  Constitutional:      General: She is not in acute distress.    Appearance: Normal appearance. She is well-developed. She is obese. She is not ill-appearing or toxic-appearing.  HENT:     Head: Normocephalic and atraumatic.     Right  Ear: External ear normal.     Left Ear: External ear normal.  Eyes:     Conjunctiva/sclera: Conjunctivae normal.     Pupils: Pupils are equal, round, and reactive to light.  Neck:     Trachea: Phonation normal.  Cardiovascular:     Rate and Rhythm: Normal rate.  Pulmonary:     Effort: Pulmonary effort is normal.  Abdominal:     General: There is no distension.  Musculoskeletal:        General: Normal range of motion.     Cervical back: Normal range of motion and neck supple.  Skin:    General: Skin is warm and dry.     Comments: Left upper back dyspnea in the axilla with an area of redness about 1 x 1 cm and mild tenderness to palpation.  No visible foreign body or vesicle or bleeding in this area.  Neurological:     Mental  Status: She is alert and oriented to person, place, and time.     Cranial Nerves: No cranial nerve deficit.     Sensory: No sensory deficit.     Motor: No abnormal muscle tone.     Coordination: Coordination normal.  Psychiatric:        Mood and Affect: Mood normal.        Behavior: Behavior normal.        Thought Content: Thought content normal.        Judgment: Judgment normal.     ED Results / Procedures / Treatments   Labs (all labs ordered are listed, but only abnormal results are displayed) Labs Reviewed - No data to display  EKG None  Radiology No results found.  Procedures Procedures  {Document cardiac monitor, telemetry assessment procedure when appropriate:1}  Medications Ordered in ED Medications - No data to display  ED Course/ Medical Decision Making/ A&P                           Medical Decision Making Suspected insect bite, mosquito or hymenoptera.  Doubt spider bite.  Antihistamine prednisone treatment initiated.   ***  {Document critical care time when appropriate:1} {Document review of labs and clinical decision tools ie heart score, Chads2Vasc2 etc:1}  {Document your independent review of radiology images, and any outside records:1} {Document your discussion with family members, caretakers, and with consultants:1} {Document social determinants of health affecting pt's care:1} {Document your decision making why or why not admission, treatments were needed:1} Final Clinical Impression(s) / ED Diagnoses Final diagnoses:  None    Rx / DC Orders ED Discharge Orders     None

## 2022-06-27 NOTE — ED Triage Notes (Signed)
Pt states she was bit by an insect several minutes ago, on her back left side, near axilla. Nickel sized red spot noted.

## 2022-06-28 ENCOUNTER — Other Ambulatory Visit: Payer: Self-pay | Admitting: *Deleted

## 2022-06-28 DIAGNOSIS — C50911 Malignant neoplasm of unspecified site of right female breast: Secondary | ICD-10-CM

## 2022-06-28 MED ORDER — VITAMIN D (ERGOCALCIFEROL) 1.25 MG (50000 UNIT) PO CAPS: 50000.0000 [IU] | ORAL_CAPSULE | ORAL | 3 refills | Status: DC

## 2022-07-02 ENCOUNTER — Telehealth: Payer: Self-pay

## 2022-07-03 ENCOUNTER — Ambulatory Visit: Payer: 59 | Admitting: Cardiology

## 2022-07-03 ENCOUNTER — Telehealth: Payer: Self-pay

## 2022-07-03 ENCOUNTER — Encounter: Payer: Self-pay | Admitting: Cardiology

## 2022-07-03 ENCOUNTER — Other Ambulatory Visit (HOSPITAL_COMMUNITY): Payer: Self-pay

## 2022-07-03 VITALS — BP 120/80 | HR 90 | Ht 64.0 in | Wt 241.0 lb

## 2022-07-03 DIAGNOSIS — R55 Syncope and collapse: Secondary | ICD-10-CM

## 2022-07-03 DIAGNOSIS — E782 Mixed hyperlipidemia: Secondary | ICD-10-CM

## 2022-07-03 DIAGNOSIS — E119 Type 2 diabetes mellitus without complications: Secondary | ICD-10-CM

## 2022-07-03 DIAGNOSIS — R072 Precordial pain: Secondary | ICD-10-CM | POA: Diagnosis not present

## 2022-07-03 DIAGNOSIS — I447 Left bundle-branch block, unspecified: Secondary | ICD-10-CM

## 2022-07-03 DIAGNOSIS — I10 Essential (primary) hypertension: Secondary | ICD-10-CM

## 2022-07-03 MED ORDER — DULOXETINE HCL 30 MG PO CPEP
30.0000 mg | ORAL_CAPSULE | Freq: Every day | ORAL | 5 refills | Status: DC
Start: 2022-07-03 — End: 2023-01-28

## 2022-07-03 MED ORDER — IVABRADINE HCL 5 MG PO TABS
15.0000 mg | ORAL_TABLET | ORAL | 0 refills | Status: DC
  Filled 2022-07-03: qty 3, 1d supply, fill #0

## 2022-07-03 MED ORDER — HYDROCODONE-ACETAMINOPHEN 5-325 MG PO TABS: 1.0000 | ORAL_TABLET | Freq: Four times a day (QID) | ORAL | 0 refills | Status: DC | PRN

## 2022-07-03 NOTE — Progress Notes (Signed)
Cardiology Office Note:    Date:  07/03/2022   ID:  [name], DOB Jun 03, 1966, MRN [id number]  PCP:  Coral Spikes, DO   [facility] Providers Cardiologist:  None     Referring MD: Coral Spikes, DO    History of Present Illness:    [name] is a 56 y.o. female here for the evaluation of syncope with left bundle branch block.  Previously seen in 2018  DM LBBB  When she found the news of sister's death she had a syncopal episode.  He was not eating and drinking normally at the time.  She felt dizzy.  Blood pressure was 96/68 at prior office visit.  Suspicion was vasovagal. Stopped BP meds.   Brother CAD Mother CABG Father CABG  She then had an episode of left-sided chest discomfort lasting a few seconds duration.  This was concerning to her.  No significant shortness of breath, no fevers chills.  Recently had spider bite went to [facility], ER for this.  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  Past Medical History:  Diagnosis Date   Anxiety    Arthritis    Blood transfusion without reported diagnosis 1999   due to heavy menses   Breast cancer ([place]) 05/22/2011   03/01/11, Stage 2, s/p lumpectomy, chemo/xrt   Complication of anesthesia    pt woke up during procedure   DDD (degenerative disc disease), lumbar    Depression 06/22/2011   Diverticulitis    DM (diabetes mellitus) ([place]) 06/22/2011   Genital warts    GERD (gastroesophageal reflux disease) 06/22/2011   Hx of adenomatous colonic polyps 10/2007   57m sigmoid tubular adenoma, FH colon cancer, mother in mid-50s   Hypercholesterolemia 06/22/2011   Hypertension 06/22/2011   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   Iron deficiency anemia 03/25/2015   Morbid obesity ([place]    Shortness of breath    STD (sexually transmitted disease)    HPV, 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   Syncope    Vaginal bleeding 03/08/2014     Past Surgical History:  Procedure Laterality Date   back surg x2     BALLOON DILATION N/A 09/22/2021   Procedure: BALLOON DILATION;  Surgeon: CEloise Harman DO;  [facility];  Service: Endoscopy;  Laterality: N/A;   BIOPSY  09/22/2021   Procedure: BIOPSY;  Surgeon: CEloise Harman DO;  Location: [facility] END[facility];  Service: Endoscopy;;   BREAST LUMPECTOMY Right 2012   CHOLECYSTECTOMY     COLONOSCOPY  11/03/07   4-mm sessile polyp removed/small internal hemorrhoids/tubular adenoma, random colon bx negative for microscopic colitis   COLONOSCOPY WITH PROPOFOL N/A 09/17/2016   Grade 2 hemorrhoids, colonic diverticulosis, ascending colon polyp (sessile serrated adenoma), 5 year surveillance   COLONOSCOPY WITH PROPOFOL N/A 09/22/2021   Procedure: COLONOSCOPY WITH PROPOFOL;  Surgeon: CEloise Harman DO;  Location: [facility] END[facility] SU[facility];  Service: Endoscopy;  Laterality: N/A;  7:30am   COLONOSCOPY, ESOPHAGOGASTRODUODENOSCOPY (EGD) AND ESOPHAGEAL DILATION  01/2012   mild gastritis s/p biopsy. Empiric dilation. Normal duodenum.    DILATATION & CURETTAGE/HYSTEROSCOPY WITH MYOSURE N/A 02/10/2015   Procedure: DILATATION & CURETTAGE/HYSTEROSCOPY WITH MYOSURE;  Surgeon: BNunzio Cobbs MD;  [facility];  Service: Gynecology;  Laterality: N/A;   ESOPHAGOGASTRODUODENOSCOPY (EGD) WITH PROPOFOL N/A 01/11/2020   Normal esophagus, s/p dilation, normal stomach, normal duodenum.  ESOPHAGOGASTRODUODENOSCOPY (EGD) WITH PROPOFOL N/A 09/22/2021   Procedure: ESOPHAGOGASTRODUODENOSCOPY (EGD) WITH PROPOFOL;  Surgeon: Eloise Harman, DO;  [facility];  Service: Endoscopy;  Laterality: N/A;   MALONEY DILATION N/A 01/11/2020   Procedure: Venia Minks DILATION;  Surgeon: Daneil Dolin, MD;  Location[facility];  Service: Endoscopy;  Laterality: N/A;   MM BREAST STEREO BX*L*R/S     rt.   POLYPECTOMY  09/17/2016   Procedure: POLYPECTOMY;  Surgeon: Daneil Dolin, MD;  Location: [facility]  END[facility];  Service: Endoscopy;;  ascending colon   POLYPECTOMY  09/22/2021   Procedure: POLYPECTOMY;  Surgeon: Eloise Harman, DO;  Location[facility];  Service: Endoscopy;;   PORT-A-CATH REMOVAL  05/26/2012   Procedure: REMOVAL PORT-A-CATH;  Surgeon: Jamesetta So, MD;  Location: [facility] ORS;  Service: General;  Laterality: N/A;  Minor Room   PORTACATH PLACEMENT      Current Medications: Current Meds  Medication Sig   atorvastatin (LIPITOR) 40 MG tablet Take 1 tablet (40 mg total) by mouth at bedtime.   blood glucose meter kit and supplies Dispense based on patient and insurance preference. Use up to four times daily as directed. (FOR ICD-10 E10.9, E11.9).   cetirizine (ZYRTEC) 10 MG tablet Take 1 tablet (10 mg total) by mouth daily.   citalopram (CELEXA) 40 MG tablet Take 1 tablet (40 mg total) by mouth daily.   Dulaglutide (TRULICITY) 6.94 WN/4.6EV SOPN INJECT 0.75 MG  SUBCUTANEOUSLY ONCE A WEEK   DULoxetine (CYMBALTA) 30 MG capsule Take 1 capsule (30 mg total) by mouth daily.   fluticasone (FLONASE) 50 MCG/ACT nasal spray USE 2 SPRAY(S) IN EACH NOSTRIL ONCE DAILY AS NEEDED   HYDROcodone-acetaminophen (NORCO/VICODIN) 5-325 MG tablet Take 1 tablet by mouth every 6 (six) hours as needed for severe pain.   ivabradine (CORLANOR) 5 MG TABS tablet Take 3 tablets (15 mg total) by mouth as directed. Take 3 tablets 2 hours before your CT scan   metFORMIN (GLUCOPHAGE-XR) 750 MG 24 hr tablet Take 1 tablet (750 mg total) by mouth daily with breakfast.   Multiple Vitamin (MULTIVITAMIN) capsule Take 1 capsule by mouth daily.   Omega-3 Fatty Acids (FISH OIL) 1000 MG CAPS Take 1,000 mg by mouth daily.   ondansetron (ZOFRAN-ODT) 8 MG disintegrating tablet DISSOLVE 1 TABLET IN MOUTH EVERY 8 HOURS AS NEEDED FOR NAUSEA OR VOMITING   pantoprazole (PROTONIX) 40 MG tablet TAKE 1 TABLET BY MOUTH TWICE DAILY BEFORE A MEAL   Vitamin D, Ergocalciferol, (DRISDOL) 1.25 MG (50000 UNIT) CAPS capsule Take 1 capsule  (50,000 Units total) by mouth once a week.     Allergies:   Flagyl [metronidazole], Lansoprazole, and Pravastatin   Social History   Socioeconomic History   Marital status: Married    Spouse name: Not on file   Number of children: 3   Years of education: Not on file   Highest education level: Not on file  Occupational History   Occupation: child care    Employer: [employer]  Tobacco Use   Smoking status: Never   Smokeless tobacco: Never  Vaping Use   Vaping Use: Never used  Substance and Sexual Activity   Alcohol use: No    Alcohol/week: 0.0 standard drinks of alcohol   Drug use: No   Sexual activity: Yes    Partners: Male    Birth control/protection: None  Other Topics Concern   Not on file  Social History Narrative   Not on file  Social Determinants of Health   Financial Resource Strain: Not on file  Food Insecurity: Not on file  Transportation Needs: Not on file  Physical Activity: Not on file  Stress: Not on file  Social Connections: Not on file     Family History: The patient's family history includes Bipolar disorder in her sister; Cancer in her mother; Colon cancer in her mother; Coronary artery disease in her brother, father, and mother; Diabetes in her father, maternal grandfather, and paternal grandmother; Diabetes type I in her father and mother; Heart disease in her paternal grandfather; Hyperlipidemia in her maternal grandmother; Hypertension in her brother, father, maternal grandfather, and maternal grandmother; Peripheral vascular disease in her mother; Stroke in her maternal grandmother.  ROS:   Please see the history of present illness.     All other systems reviewed and are negative.  EKGs/Labs/Other Studies Reviewed:    The following studies were reviewed today: Echo 2012 shows normal ejection fraction  EKG:   left branch block 84 bpm on 05/01/2022  Recent Labs: 05/01/2022: ALT 24; BUN 14; Creatinine, Ser 0.93; Potassium 4.0; Sodium  137 7/49/4496: Hemoglobin DUPLICATE REQUEST; Platelets DUPLICATE REQUEST  Recent Lipid Panel    Component Value Date/Time   CHOL 158 10/19/2021 0820   TRIG 147 10/19/2021 0820   HDL 44 10/19/2021 0820   CHOLHDL 3.6 10/19/2021 0820   CHOLHDL 4.4 10/20/2018 0749   VLDL 36 06/04/2014 0919   LDLCALC 88 10/19/2021 0820   LDLCALC 98 10/20/2018 0749     Risk Assessment/Calculations:              Physical Exam:    VS:  BP 120/80 (BP Location: Left Arm, Patient Position: Sitting, Cuff Size: Normal)   Pulse 90   Ht 5' 4" (1.626 m)   Wt 241 lb (109.3 kg)   LMP 04/05/2016 Comment: spotting 11/2016 and 12/2016  SpO2 96%   BMI 41.37 kg/m     Wt Readings from Last 3 Encounters:  07/03/22 241 lb (109.3 kg)  06/27/22 240 lb 11.2 oz (109.2 kg)  05/15/22 241 lb (109.3 kg)     GEN:  Well nourished, well developed in no acute distress HEENT: Normal NECK: No JVD; No carotid bruits LYMPHATICS: No lymphadenopathy CARDIAC: RRR, no murmurs, no rubs, gallops RESPIRATORY:  Clear to auscultation without rales, wheezing or rhonchi  ABDOMEN: Soft, non-tender, non-distended MUSCULOSKELETAL:  No edema; No deformity  SKIN: Warm and dry NEUROLOGIC:  Alert and oriented x 3 PSYCHIATRIC:  Normal affect   ASSESSMENT:    1. LBBB (left bundle branch block)   2. Mixed hyperlipidemia   3. Diabetes mellitus with coincident hypertension ([place])   4. Precordial chest pain   5. Vasovagal syncope    PLAN:    In order of problems listed above:  Syncope -Agree with Dr. Lacinda Axon, likely vasovagal event in the setting of high anxiety, hyperventilation after hearing of her sister's death.  Blood pressure was already on the low side.  Agree with discontinuation of her antihypertensives.  Hydration.  Anxiety controlled.  She has not had any future episodes.  I think it is not likely related to left bundle branch block or conduction abnormality.  If recurs, I will have low threshold for ZIO  monitoring.  Precordial chest pain - We will check a coronary CT scan with possible FFR analysis.  Ivabradine 15 mg prior to study.  Blood pressure is fairly soft.  She has left bundle branch block, diabetes. -Agree with atorvastatin 40 mg  a day given diabetes.  LDL 88.  If plaque is present encourage LDL of less than 70.  May be able to obtain this if transitioned over to rosuvastatin 40 or if addition of Zetia 10 mg is made.  Left bundle branch block - Noted since 2021 EKG.  Explained to them the physiology behind this.  No need for pacemaker.  When left in a ranch block is discovered, we do like to make sure that there is no underlying coronary artery disease or ischemic cause.  We are going to obtain a coronary CT scan.  Strong family history of CAD - Brother, father, mother, stents and CABG   Medication Adjustments/Labs and Tests Ordered: Current medicines are reviewed at length with the patient today.  Concerns regarding medicines are outlined above.  Orders Placed This Encounter  Procedures   CT CORONARY MORPH W/CTA COR W/SCORE W/CA W/CM &/OR WO/CM   ECHOCARDIOGRAM COMPLETE   Meds ordered this encounter  Medications   ivabradine (CORLANOR) 5 MG TABS tablet    Sig: Take 3 tablets (15 mg total) by mouth as directed. Take 3 tablets 2 hours before your CT scan    Dispense:  3 tablet    Refill:  0    CASH PRICE $10/TAB - PT WILL CALL WHEN NEEDS FILLED    Patient Instructions  Medication Instructions:  The current medical regimen is effective;  continue present plan and medications.   *If you need a refill on your cardiac medications before your next appointment, please call your pharmacy*  Testing/Procedures: Your physician has requested that you have an echocardiogram at [facility]. Echocardiography is a painless test that uses sound waves to create images of your heart. It provides your doctor with information about the size and shape of your heart and how well your heart's  chambers and valves are working. This procedure takes approximately one hour. There are no restrictions for this procedure.    Your cardiac CT will be scheduled at:   [facility] [address] [place], [place] [address] [phone]  Please arrive at the [facility] (Entrance C2) of [facility] 30 minutes prior to test start time. You can use the FREE valet parking offered at entrance C (encouraged to control the heart rate for the test)  Proceed to the [facility] Radiology Department (first floor) to check-in and test prep.  All radiology patients and guests should use entrance C2 at [facility], accessed from [facility], even though the hospital's physical address listed is [address].     Please follow these instructions carefully (unless otherwise directed):  On the Night Before the Test: Be sure to Drink plenty of water. Do not consume any caffeinated/decaffeinated beverages or chocolate 12 hours prior to your test. Do not take any antihistamines 12 hours prior to your test.  On the Day of the Test: Drink plenty of water until 1 hour prior to the test. Do not eat any food 4 hours prior to the test. You may take your regular medications prior to the test.  Take metoprolol (Lopressor) two hours prior to test. HOLD Furosemide/Hydrochlorothiazide morning of the test. FEMALES- please wear underwire-free bra if available, avoid dresses & tight clothing      After the Test: Drink plenty of water. After receiving IV contrast, you may experience a mild flushed feeling. This is normal. On occasion, you may experience a mild rash up to 24 hours after the  test. This is not dangerous. If this occurs, you can take Benadryl 25 mg and increase your fluid intake. If you experience trouble breathing, this can be serious. If it is severe call 911 IMMEDIATELY. If it is mild, please call our office. If you take any of  these medications: Glipizide/Metformin, Avandament, Glucavance, please do not take 48 hours after completing test unless otherwise instructed.  We will call to schedule your test 2-4 weeks out understanding that some insurance companies will need an authorization prior to the service being performed.   For non-scheduling related questions, please contact the cardiac imaging nurse navigator should you have any questions/concerns: Marchia Bond, Cardiac Imaging Nurse Navigator Gordy Clement, Cardiac Imaging Nurse Navigator [facility] Direct Office Dial: [phone]   For scheduling needs, including cancellations and rescheduling, please call [place], [phone].  Follow-Up: At [facility], you and your health needs are our priority.  As part of our continuing mission to provide you with exceptional heart care, we have created designated Provider Care Teams.  These Care Teams include your primary Cardiologist (physician) and Advanced Practice Providers (APPs -  Physician Assistants and Nurse Practitioners) who all work together to provide you with the care you need, when you need it.  We recommend signing up for the patient portal called "MyChart".  Sign up information is provided on this After Visit Summary.  MyChart is used to connect with patients for Virtual Visits (Telemedicine).  Patients are able to view lab/test results, encounter notes, upcoming appointments, etc.  Non-urgent messages can be sent to your provider as well.   To learn more about what you can do with MyChart, go to [email or web address].    Your next appointment:   Follow up will be determined after the results of the above testing.   Important Information About Sugar         Signed, Candee Furbish, MD  07/03/2022 5:00 PM    [place]

## 2022-07-03 NOTE — Telephone Encounter (Signed)
Over the phone PA for Hydrocodone 5-325 MG has been approved per [employer]. Insurance to fax. Message left on patient voicemail to call back if any issues.

## 2022-07-03 NOTE — Telephone Encounter (Signed)
Dr Dagoberto Ligas Note was Reviewed.  [name] taking Cymbalta 30 mg and Celexa 40 mg daily PMP was Reviewed.  Hydrocodone e-scribed today.  Ms [name] is aware of the above and verbalizes understanding.

## 2022-07-03 NOTE — Patient Instructions (Addendum)
Medication Instructions:  The current medical regimen is effective;  continue present plan and medications.   *If you need a refill on your cardiac medications before your next appointment, please call your pharmacy*  Testing/Procedures: Your physician has requested that you have an echocardiogram at [facility]. Echocardiography is a painless test that uses sound waves to create images of your heart. It provides your doctor with information about the size and shape of your heart and how well your heart's chambers and valves are working. This procedure takes approximately one hour. There are no restrictions for this procedure.    Your cardiac CT will be scheduled at:   [facility] [address] [place], [place] [address] [phone]  Please arrive at the [facility] and Children's Entrance (Entrance C2) of [facility] 30 minutes prior to test start time. You can use the FREE valet parking offered at entrance C (encouraged to control the heart rate for the test)  Proceed to the [facility] Radiology Department (first floor) to check-in and test prep.  All radiology patients and guests should use entrance C2 at [facility], accessed from [facility], even though the hospital's physical address listed is [address]     Please follow these instructions carefully (unless otherwise directed):  On the Night Before the Test: Be sure to Drink plenty of water. Do not consume any caffeinated/decaffeinated beverages or chocolate 12 hours prior to your test. Do not take any antihistamines 12 hours prior to your test.  On the Day of the Test: Drink plenty of water until 1 hour prior to the test. Do not eat any food 4 hours prior to the test. You may take your regular medications prior to the test.  Take metoprolol (Lopressor) two hours prior to test. HOLD Furosemide/Hydrochlorothiazide morning of the test. FEMALES- please wear  underwire-free bra if available, avoid dresses & tight clothing      After the Test: Drink plenty of water. After receiving IV contrast, you may experience a mild flushed feeling. This is normal. On occasion, you may experience a mild rash up to 24 hours after the test. This is not dangerous. If this occurs, you can take Benadryl 25 mg and increase your fluid intake. If you experience trouble breathing, this can be serious. If it is severe call 911 IMMEDIATELY. If it is mild, please call our office. If you take any of these medications: Glipizide/Metformin, Avandament, Glucavance, please do not take 48 hours after completing test unless otherwise instructed.  We will call to schedule your test 2-4 weeks out understanding that some insurance companies will need an authorization prior to the service being performed.   For non-scheduling related questions, please contact the cardiac imaging nurse navigator should you have any questions/concerns: Marchia Bond, Cardiac Imaging Nurse Navigator Gordy Clement, Cardiac Imaging Nurse Navigator [facility] Direct Office Dial: [phone]   For scheduling needs, including cancellations and rescheduling, please call [place], [phone].  Follow-Up: At [facility], you and your health needs are our priority.  As part of our continuing mission to provide you with exceptional heart care, we have created designated Provider Care Teams.  These Care Teams include your primary Cardiologist (physician) and Advanced Practice Providers (APPs -  Physician Assistants and Nurse Practitioners) who all work together to provide you with the care you need, when you need it.  We recommend signing up for the patient portal called "MyChart".  Sign up information  is provided on this After Visit Summary.  MyChart is used to connect with patients for Virtual Visits (Telemedicine).  Patients are able to view lab/test results, encounter notes,  upcoming appointments, etc.  Non-urgent messages can be sent to your provider as well.   To learn more about what you can do with MyChart, go to [email or web address].    Your next appointment:   Follow up will be determined after the results of the above testing.   Important Information About Sugar

## 2022-07-04 ENCOUNTER — Telehealth: Payer: Self-pay | Admitting: *Deleted

## 2022-07-04 NOTE — Telephone Encounter (Signed)
Prior auth submitted via CoverMyMeds for hydrocodone acetaminophen.

## 2022-07-04 NOTE — Telephone Encounter (Signed)
Per faxed: Hydrocodone - APAP 5-325 MG  Tab approved from 07/03/2022 to 01/03/2023.

## 2022-07-04 NOTE — Telephone Encounter (Addendum)
Approval received from insurance. 07/04/22-12/31/22.  Pharmacy notified.

## 2022-07-10 ENCOUNTER — Ambulatory Visit (HOSPITAL_COMMUNITY)
Admission: RE | Admit: 2022-07-10 | Discharge: 2022-07-10 | Disposition: A | Discharge status: Home / Self Care | Payer: 59 | Source: Ambulatory Visit | Attending: Cardiology | Admitting: Cardiology

## 2022-07-10 DIAGNOSIS — I447 Left bundle-branch block, unspecified: Secondary | ICD-10-CM | POA: Diagnosis present

## 2022-07-10 DIAGNOSIS — R55 Syncope and collapse: Secondary | ICD-10-CM | POA: Insufficient documentation

## 2022-07-10 LAB — ECHOCARDIOGRAM COMPLETE
AR max vel: 1.88 cm2
AV Area VTI: 1.86 cm2
AV Area mean vel: 1.75 cm2
AV Mean grad: 5 mmHg
AV Peak grad: 8.8 mmHg
Ao pk vel: 1.48 m/s
Area-P 1/2: 5.93 cm2
MV VTI: 2.18 cm2
S' Lateral: 2.55 cm
Single Plane A4C EF: 53.1 %

## 2022-07-10 MED ORDER — PERFLUTREN LIPID MICROSPHERE
1.0000 mL | INTRAVENOUS | Status: AC | PRN
  Administered 2022-07-10: 6 mL via INTRAVENOUS

## 2022-07-10 NOTE — Progress Notes (Signed)
*  PRELIMINARY RESULTS* Echocardiogram 2D Echocardiogram has been performed.  [name] 07/10/2022, 4:26 PM

## 2022-07-12 ENCOUNTER — Ambulatory Visit (INDEPENDENT_AMBULATORY_CARE_PROVIDER_SITE_OTHER): Payer: 59

## 2022-07-12 ENCOUNTER — Telehealth: Payer: Self-pay

## 2022-07-12 ENCOUNTER — Other Ambulatory Visit: Payer: Self-pay | Admitting: Cardiology

## 2022-07-12 ENCOUNTER — Other Ambulatory Visit (HOSPITAL_COMMUNITY): Payer: Self-pay

## 2022-07-12 DIAGNOSIS — R55 Syncope and collapse: Secondary | ICD-10-CM

## 2022-07-12 DIAGNOSIS — I447 Left bundle-branch block, unspecified: Secondary | ICD-10-CM

## 2022-07-12 DIAGNOSIS — R9430 Abnormal result of cardiovascular function study, unspecified: Secondary | ICD-10-CM

## 2022-07-12 DIAGNOSIS — R072 Precordial pain: Secondary | ICD-10-CM

## 2022-07-12 MED ORDER — ENTRESTO 24-26 MG PO TABS: 1.0000 | ORAL_TABLET | Freq: Two times a day (BID) | ORAL | 11 refills | Status: DC

## 2022-07-12 NOTE — Telephone Encounter (Signed)
Called patient with results of echo. Patient will start Entresto 24/26 mg by mouth twice daily. Ordered monitor for 14 day zio patch. If patient receives 14 days prior to CT, she will put on then, if not she will put on after her CT. Scheduled patient with Melina Copa PA on 08/23/22 to give patient enough time to wear monitor and get results back. Patient will get BMET when she comes in to see Melina Copa PA. Ordered Entresto with dot phrase with Delene Loll free 30 days. Will send message to our pre-auth nurse to help if patient needs assistance due to cost of Entresto.

## 2022-07-12 NOTE — Telephone Encounter (Signed)
-----   Message from Jerline Pain, MD sent at 07/12/2022 11:50 AM EDT ----- EF 30-35% moderate decreased function. Change from prior ECHO. Let's start Entresto 24/26 mg PO BID. Watch for any signs of low blood pressure.  She has CT coronary evaluation in September - excellent. Let's place a Zio monitor to evaluate for any adverse arrhythmias. (Can place after CT scan). Non live monitor OK, 14 days.   Let's have her follow up with APP in one month with BMET at that time. Candee Furbish, MD

## 2022-07-12 NOTE — Progress Notes (Unsigned)
Enrolled for [employer] to mail a ZIO XT long term holter monitor to the patients address on file.  

## 2022-07-16 DIAGNOSIS — I447 Left bundle-branch block, unspecified: Secondary | ICD-10-CM

## 2022-07-16 DIAGNOSIS — R55 Syncope and collapse: Secondary | ICD-10-CM

## 2022-07-16 DIAGNOSIS — R072 Precordial pain: Secondary | ICD-10-CM | POA: Diagnosis not present

## 2022-07-16 DIAGNOSIS — R9430 Abnormal result of cardiovascular function study, unspecified: Secondary | ICD-10-CM | POA: Diagnosis not present

## 2022-07-17 ENCOUNTER — Ambulatory Visit (INDEPENDENT_AMBULATORY_CARE_PROVIDER_SITE_OTHER): Payer: 59

## 2022-07-17 DIAGNOSIS — Z111 Encounter for screening for respiratory tuberculosis: Secondary | ICD-10-CM | POA: Diagnosis not present

## 2022-07-19 LAB — TB SKIN TEST
Induration: 0 mm
TB Skin Test: NEGATIVE

## 2022-07-20 ENCOUNTER — Other Ambulatory Visit: Payer: Self-pay | Admitting: Family Medicine

## 2022-07-20 DIAGNOSIS — J302 Other seasonal allergic rhinitis: Secondary | ICD-10-CM

## 2022-07-20 DIAGNOSIS — F324 Major depressive disorder, single episode, in partial remission: Secondary | ICD-10-CM

## 2022-07-21 ENCOUNTER — Other Ambulatory Visit: Payer: Self-pay | Admitting: Family Medicine

## 2022-07-21 DIAGNOSIS — E1165 Type 2 diabetes mellitus with hyperglycemia: Secondary | ICD-10-CM

## 2022-07-30 ENCOUNTER — Telehealth (HOSPITAL_COMMUNITY): Payer: Self-pay | Admitting: Emergency Medicine

## 2022-07-30 NOTE — Telephone Encounter (Signed)
Reaching out to patient to offer assistance regarding upcoming cardiac imaging study; pt verbalizes understanding of appt date/time, parking situation and where to check in, pre-test NPO status and medications ordered, and verified current allergies; name and call back number provided for further questions should they arise Marchia Bond RN Navigator Cardiac Imaging [facility] [phone] office [phone] cell  Arrival 330 w/c entrance '15mg'$  ivabradine  Needs istat creat NO BP/PUNCTURES TO R ARM

## 2022-07-30 NOTE — Telephone Encounter (Signed)
Attempted to call patient regarding upcoming cardiac CT appointment. °Left message on voicemail with name and callback number °Anja Neuzil RN Navigator Cardiac Imaging °[facility] °[phone] Office °[phone] Cell ° °

## 2022-07-31 ENCOUNTER — Ambulatory Visit (HOSPITAL_COMMUNITY)
Admission: RE | Admit: 2022-07-31 | Discharge: 2022-07-31 | Disposition: A | Discharge status: Home / Self Care | Payer: 59 | Source: Ambulatory Visit | Attending: Cardiology | Admitting: Cardiology

## 2022-07-31 VITALS — BP 108/70 | HR 88

## 2022-07-31 DIAGNOSIS — I700 Atherosclerosis of aorta: Secondary | ICD-10-CM | POA: Diagnosis not present

## 2022-07-31 DIAGNOSIS — R55 Syncope and collapse: Secondary | ICD-10-CM | POA: Insufficient documentation

## 2022-07-31 DIAGNOSIS — E782 Mixed hyperlipidemia: Secondary | ICD-10-CM | POA: Diagnosis present

## 2022-07-31 DIAGNOSIS — E119 Type 2 diabetes mellitus without complications: Secondary | ICD-10-CM | POA: Diagnosis present

## 2022-07-31 DIAGNOSIS — R072 Precordial pain: Secondary | ICD-10-CM | POA: Diagnosis present

## 2022-07-31 MED ORDER — DILTIAZEM HCL 25 MG/5ML IV SOLN: 10.0000 mg | INTRAVENOUS | Status: DC | PRN

## 2022-07-31 MED ORDER — METOPROLOL TARTRATE 5 MG/5ML IV SOLN
INTRAVENOUS | Status: AC
  Administered 2022-07-31: 10 mg via INTRAVENOUS
  Filled 2022-07-31: qty 20

## 2022-07-31 MED ORDER — NITROGLYCERIN 0.4 MG SL SUBL: 0.8000 mg | SUBLINGUAL_TABLET | Freq: Once | SUBLINGUAL | Status: DC

## 2022-07-31 MED ORDER — NITROGLYCERIN 0.4 MG SL SUBL
SUBLINGUAL_TABLET | SUBLINGUAL | Status: AC
  Filled 2022-07-31: qty 2

## 2022-07-31 MED ORDER — DILTIAZEM HCL 25 MG/5ML IV SOLN
INTRAVENOUS | Status: AC
  Administered 2022-07-31: 10 mg via INTRAVENOUS
  Filled 2022-07-31: qty 5

## 2022-07-31 MED ORDER — IOHEXOL 350 MG/ML SOLN
100.0000 mL | Freq: Once | INTRAVENOUS | Status: AC | PRN
  Administered 2022-07-31: 100 mL via INTRAVENOUS

## 2022-07-31 MED ORDER — METOPROLOL TARTRATE 5 MG/5ML IV SOLN: 10.0000 mg | INTRAVENOUS | Status: DC | PRN

## 2022-08-01 ENCOUNTER — Telehealth: Payer: Self-pay | Admitting: Family Medicine

## 2022-08-01 ENCOUNTER — Telehealth: Payer: Self-pay

## 2022-08-01 NOTE — Telephone Encounter (Signed)
Trulicity 0.'75mg'$ /0.5 ml has been approved from 07/31/22-08/01/23. Med will be covered as long as pt remains covered by prescription drug plan and there are no changes to plan benefits. Pt contacted and made aware.

## 2022-08-01 NOTE — Telephone Encounter (Signed)
Refill request for pain meds

## 2022-08-02 ENCOUNTER — Other Ambulatory Visit: Payer: Self-pay | Admitting: *Deleted

## 2022-08-02 ENCOUNTER — Telehealth: Payer: Self-pay | Admitting: *Deleted

## 2022-08-02 DIAGNOSIS — Z79899 Other long term (current) drug therapy: Secondary | ICD-10-CM

## 2022-08-02 DIAGNOSIS — E782 Mixed hyperlipidemia: Secondary | ICD-10-CM

## 2022-08-02 LAB — POCT I-STAT CREATININE: Creatinine, Ser: 0.9 mg/dL (ref 0.44–1.00)

## 2022-08-02 MED ORDER — HYDROCODONE-ACETAMINOPHEN 5-325 MG PO TABS: 1.0000 | ORAL_TABLET | Freq: Four times a day (QID) | ORAL | 0 refills | Status: DC | PRN

## 2022-08-02 MED ORDER — EZETIMIBE 10 MG PO TABS: 10.0000 mg | ORAL_TABLET | Freq: Every day | ORAL | 3 refills | Status: DC

## 2022-08-02 NOTE — Telephone Encounter (Signed)
Reviewed results and orders with patient.  She is agreeable to starting zetia 10 mg daily and having Lipid panel in 2 months.  RX sent into [facility] as requested.  Lipid panel will be drawn at [facility] in [place] in November.  We also discussed cutting back on/out any white foods - bread,rice, potatoes etc to help with wt loss.  Advised doing home exercises and walking to help as well.  Co-pay card mailed to her home address for [facility].  All questions were answered at the time of the call.   She will call back with any further questions/concerns.

## 2022-08-07 DIAGNOSIS — R9430 Abnormal result of cardiovascular function study, unspecified: Secondary | ICD-10-CM | POA: Diagnosis not present

## 2022-08-13 ENCOUNTER — Other Ambulatory Visit: Payer: Self-pay | Admitting: Family Medicine

## 2022-08-13 DIAGNOSIS — J302 Other seasonal allergic rhinitis: Secondary | ICD-10-CM

## 2022-08-14 ENCOUNTER — Ambulatory Visit: Payer: 59 | Admitting: Cardiology

## 2022-08-16 ENCOUNTER — Ambulatory Visit: Payer: 59 | Admitting: Family Medicine

## 2022-08-22 ENCOUNTER — Encounter: Payer: Self-pay | Admitting: Physician Assistant

## 2022-08-22 NOTE — Progress Notes (Addendum)
Cardiology Office Note    Date:  08/23/2022   ID:  ORRA NOLDE, DOB [birth year], MRN [id number]  PCP:  Coral Spikes, DO  Cardiologist:  Candee Furbish, MD  Electrophysiologist:  None   Chief Complaint: f/u syncope, abnormal cardiac testing  History of Present Illness:   CLARANN HELVEY is a 56 y.o. female with history of DM, breast CA s/p lumpectomy/chemo/xrt, anxiety, depression, DDD, diverticulitis, GERD, HTN, HLD, IDA, obesity, LBBB (since 2022), syncope, recently diagnosed NICM, mild CAD, aortic atherosclerosis, PSVT who is seen for follow-up.  She was remotely evaluated in 2018 for chest pain with emotional stress. Nuclear stress test was felt reassuring with abnormality felt d/t breast attenuation rather than ischemia.   She re-established care with Dr. Marlou Porch in 06/2022 for evaluation of syncope. In June of this year her sister was actively dying from breast cancer. She was on the phone with her family who helped put the phone to her sister so she could say goodbye. 5 minutes later her sister passed away. When the patient got the news, the next thing she knew, she had blacked out for just a moment. This was witnessed by her husband. There was no seizure activity, b/b incontinence, or post-ictal state. She instantly remembered her sister had died and began hyperventilating. Within a few moments she felt normal. She didn't seek care immediately but in close follow-up had BP 98 systolic so lisinopril was decreased (then eventually stopped). The suspicion was vasovagal. Dr. Marlou Porch agreed this was likely the case in his visit 06/2022. No specific driving restrictions implicated. He did note she'd had a LBBB on EKG that had been identified in 08/2021. Subsequent echo 07/10/22 showed poor acoustic windows but with EF 30-35%, severe hypokinesis/akinesis of the septal wall; hypokinesis of the anterior and inferolateral wall, mild LVE, mild concentric LVH, mild MR. Cor CT showed CAC 141 (96%ile) with  mild nonobstructive disease as outlined below. Zetia was added. 14 day Zio showed predominant NSR range 56-182 with average HR 98bpm, 9 SVT runs (longest lasting 45 seconds), rare PAC/PVCs, no dangerous arrhythmias. She was started on Entresto with plan for f/u.   She returns for follow-up today with her husband. I reviewed her studies in depth with them. She reports NYHA class 2 DOE which has been ongoing the last few months, resolved with rest. The Entresto did help somewhat with her breathing. She has an occasional chest twinge but no anginal type chest pain. No significant palpitations, LE edema or orthopnea. She has not had any recurrent episodes of syncope. She reports a significant family history of CAD in multiple family members but no known family history of heart failure or sudden cardiac death. No family hx of congenital deafness. Denies any history of tobacco, ETOH, illicit drug use. Review of onc records shows she received paclitaxel for her breast CA. She works in [employer]. She does notice some stiffness of her joints since the medicines were adjusted above. She wonders if it is the [place] since a client of hers also reported they take it and have had issues.    Labwork independently reviewed: 07/2022 Cr 0.90 04/2022 CMET ok Cr 0.93, K 4.0, LFTs ok, CBC ok 10/2021 A1C 6.1, LDL 88, trig 147 (PCP) 2019 TSH ok  Cardiology Studies:   Studies reviewed are outlined and summarized above. Reports included below if pertinent.   Monitor 07/2022     Normal sinus rhythm avg HR 98 bpm   Atrial tachycardia - benign  Rare PAC/PVC   No atrial fibrillation   Symptoms triggered were associiated with sinus rhythm/mild tachycardia - benign     Patch Wear Time:  13 days and 21 hours (2023-08-28T20:33:30-0400 to 2023-09-11T17:37:39-0400)   Patient had a min HR of 56 bpm, max HR of 182 bpm, and avg HR of 98 bpm. Predominant underlying rhythm was Sinus Rhythm. Bundle Branch Block/IVCD was present.  9 Supraventricular Tachycardia runs occurred, the run with the fastest interval lasting 18  beats with a max rate of 182 bpm, the longest lasting 45.0 secs with an avg rate of 108 bpm. Isolated SVEs were rare (<1.0%), SVE Couplets were rare (<1.0%), and SVE Triplets were rare (<1.0%). Isolated VEs were rare (<1.0%, 889), VE Couplets were rare  (<1.0%, 4), and VE Triplets were rare (<1.0%, 1).  Cor CT 07/2022   IMPRESSION: 1. No significant noncardiac supplemental findings.     Electronically Signed   By: Kerby Moors M.D.   On: 08/01/2022 10:22   EXAM: Cardiac/Coronary CTA   TECHNIQUE: A non-contrast, gated CT scan was obtained with axial slices of 3 mm through the heart for calcium scoring. Calcium scoring was performed using the Agatston method. A 120 kV prospective, gated, contrast cardiac scan was obtained. Gantry rotation speed was 250 msecs and collimation was 0.6 mm. Two sublingual nitroglycerin tablets (0.8 mg) were given. The 3D data set was reconstructed in 5% intervals of the 35-75% of the R-R cycle. Diastolic phases were analyzed on a dedicated workstation using MPR, MIP, and VRT modes. The patient received 95 cc of contrast.   FINDINGS: Image quality: Average.   Noise artifact is: Limited. (signal-to-noise).   Coronary Arteries:  Normal coronary origin.  Right dominance.   Left main: The left main is a large caliber vessel with a normal take off from the left coronary cusp that trifurcates into a LAD, LCX, and ramus intermedius. There is no plaque or stenosis.   Left anterior descending artery: The LAD has minimal (0-24) calcified stenosis in the proximal vessel. The LAD gives off 1 large, branching diagonal; mild (25-49) calcified stenosis in the proximal vessel.   Ramus intermedius: Mild (25-49) plaque in the distal vessel.   Left circumflex artery: The LCX is non-dominant and patent with no evidence of plaque or stenosis. The LCX gives off 1 patent  obtuse marginal branch.   Right coronary artery: The RCA is dominant with normal take off from the right coronary cusp. There is no evidence of plaque or stenosis. The RCA terminates as a PDA and right posterolateral branch without evidence of plaque or stenosis.   Right Atrium: Right atrial size is within normal limits.   Right Ventricle: The right ventricular cavity is within normal limits.   Left Atrium: Left atrial size is normal in size with no left atrial appendage filling defect.   Left Ventricle: The ventricular cavity size is within normal limits. There are no stigmata of prior infarction. There is no abnormal filling defect.   Pulmonary arteries: Normal in size without proximal filling defect.   Pulmonary veins: Normal pulmonary venous drainage.   Pericardium: Normal thickness with no significant effusion or calcium present.   Cardiac valves: The aortic valve is trileaflet without significant calcification. The mitral valve is normal structure without significant calcification.   Aorta: Normal caliber with mild aortic atherosclerosis.   Extra-cardiac findings: See attached radiology report for non-cardiac structures.   IMPRESSION: 1. Coronary calcium score of 141. This was 59 percentile for age-, sex, and  race-matched controls.   2. Normal coronary origin with right dominance.   3. Mild CAD as outlined above.   4. Aortic atherosclerosis   RECOMMENDATIONS: CAD-RADS 2: Mild non-obstructive CAD (25-49%). Consider non-atherosclerotic causes of chest pain. Consider preventive therapy and risk factor modification.   Kirk Ruths, MD   Electronically Signed: By: Kirk Ruths M.D. On: 07/31/2022 17:45   2D echo 06/2022    1. Poor acoustic windows limit study. Definity used. LVEF is depressed  with severe hypokinesis/akinesis of the septal wall; hypokinesis of the  anterior and inferolateral wall. Left ventricular ejection fraction, by  estimation,  is 30 to 35%. The left  ventricle has moderately decreased function. The left ventricular internal  cavity size was mildly dilated. There is mild concentric left ventricular  hypertrophy. Indeterminate diastolic filling due to E-A fusion.   2. Right ventricular systolic function is normal. The right ventricular  size is normal. There is normal pulmonary artery systolic pressure.   3. The mitral valve is normal in structure. Mild mitral valve  regurgitation.   4. The aortic valve is tricuspid. Aortic valve regurgitation is not  visualized.   5. The inferior vena cava is normal in size with greater than 50%  respiratory variability, suggesting right atrial pressure of 3 mmHg.     Past Medical History:  Diagnosis Date   Anxiety    Arthritis    Blood transfusion without reported diagnosis 1999   due to heavy menses   Breast cancer ([place]) 05/22/2011   03/01/11, Stage 2, s/p lumpectomy, chemo/xrt   Complication of anesthesia    pt woke up during procedure   DDD (degenerative disc disease), lumbar    Depression 06/22/2011   Diverticulitis    DM (diabetes mellitus) ([place]) 06/22/2011   Genital warts    GERD (gastroesophageal reflux disease) 06/22/2011   Hx of adenomatous colonic polyps 10/2007   24m sigmoid tubular adenoma, FH colon cancer, mother in mid-50s   Hypercholesterolemia 06/22/2011   Hypertension 06/22/2011   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 05/22/2011   Iron deficiency anemia 03/25/2015   LBBB (left bundle branch block)    Mild CAD    Morbid obesity ([facility])    [place] ([place]ischemic cardiomyopathy) [place]    PSVT (paroxysmal supraventricular tachycardia)    Shortness of breath    STD (sexually transmitted disease)    HPV, 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   Syncope    Syncope    Vaginal bleeding 03/08/2014    Past Surgical History:  Procedure Laterality Date   back surg x2     BALLOON DILATION N/A 09/22/2021   Procedure: BALLOON DILATION;  Surgeon: CEloise Harman DO;  [facility];  Service: Endoscopy;  Laterality: N/A;   BIOPSY  09/22/2021   Procedure: BIOPSY;  Surgeon: CEloise Harman DO;  [facility];  Service: Endoscopy;;   BREAST LUMPECTOMY Right 2012   CHOLECYSTECTOMY     COLONOSCOPY  11/03/07   4-mm sessile polyp removed/small internal hemorrhoids/tubular adenoma, random colon bx negative for microscopic colitis   COLONOSCOPY WITH PROPOFOL N/A 09/17/2016   Grade 2 hemorrhoids, colonic diverticulosis, ascending colon polyp (sessile serrated adenoma), 5 year surveillance   COLONOSCOPY WITH PROPOFOL N/A 09/22/2021   Procedure: COLONOSCOPY WITH PROPOFOL;  Surgeon: CEloise Harman DO;  [facility];  Service: Endoscopy;  Laterality: N/A;  7:30am   COLONOSCOPY, ESOPHAGOGASTRODUODENOSCOPY (EGD) AND ESOPHAGEAL DILATION  01/2012   mild gastritis s/p biopsy. Empiric dilation.  Normal duodenum.    DILATATION & CURETTAGE/HYSTEROSCOPY WITH MYOSURE N/A 02/10/2015   Procedure: DILATATION & CURETTAGE/HYSTEROSCOPY WITH MYOSURE;  Surgeon: Nunzio Cobbs, MD;  Location: [facility];  Service: Gynecology;  Laterality: N/A;   ESOPHAGOGASTRODUODENOSCOPY (EGD) WITH PROPOFOL N/A 01/11/2020   Normal esophagus, s/p dilation, normal stomach, normal duodenum.    ESOPHAGOGASTRODUODENOSCOPY (EGD) WITH PROPOFOL N/A 09/22/2021   Procedure: ESOPHAGOGASTRODUODENOSCOPY (EGD) WITH PROPOFOL;  Surgeon: Eloise Harman, DO;  [facility];  Service: Endoscopy;  Laterality: N/A;   MALONEY DILATION N/A 01/11/2020   Procedure: Venia Minks DILATION;  Surgeon: Daneil Dolin, MD;  [facility];  Service: Endoscopy;  Laterality: N/A;   MM BREAST STEREO BX*L*R/S     rt.   POLYPECTOMY  09/17/2016   Procedure: POLYPECTOMY;  Surgeon: Daneil Dolin, MD;  [facility];  Service: Endoscopy;;  ascending colon   POLYPECTOMY  09/22/2021   Procedure: POLYPECTOMY;  Surgeon: Eloise Harman, DO;  [facility];   Service: Endoscopy;;   PORT-A-CATH REMOVAL  05/26/2012   Procedure: REMOVAL PORT-A-CATH;  Surgeon: Jamesetta So, MD;  Location: [facility];  Service: General;  Laterality: N/A;  Minor Room   PORTACATH PLACEMENT      Current Medications: Current Meds  Medication Sig   atorvastatin (LIPITOR) 40 MG tablet TAKE 1 TABLET BY MOUTH EVERYDAY AT BEDTIME   blood glucose meter kit and supplies Dispense based on patient and insurance preference. Use up to four times daily as directed. (FOR ICD-10 E10.9, E11.9).   cetirizine (ZYRTEC) 10 MG tablet TAKE 1 TABLET BY MOUTH EVERY DAY   citalopram (CELEXA) 40 MG tablet TAKE 1 TABLET BY MOUTH EVERY DAY   Dulaglutide (TRULICITY) 2.67 TI/4.5YK SOPN INJECT 0.75 MG SUBCUTANEOUSLY ONCE A WEEK   DULoxetine (CYMBALTA) 30 MG capsule Take 1 capsule (30 mg total) by mouth daily.   ezetimibe (ZETIA) 10 MG tablet Take 1 tablet (10 mg total) by mouth daily.   fluticasone (FLONASE) 50 MCG/ACT nasal spray USE 2 SPRAY(S) IN EACH NOSTRIL ONCE DAILY AS NEEDED   HYDROcodone-acetaminophen (NORCO/VICODIN) 5-325 MG tablet Take 1 tablet by mouth every 6 (six) hours as needed for severe pain.   metFORMIN (GLUCOPHAGE-XR) 750 MG 24 hr tablet TAKE 1 TABLET BY MOUTH EVERY DAY WITH BREAKFAST   Multiple Vitamin (MULTIVITAMIN) capsule Take 1 capsule by mouth daily.   Omega-3 Fatty Acids (FISH OIL) 1000 MG CAPS Take 1,000 mg by mouth daily.   ondansetron (ZOFRAN-ODT) 8 MG disintegrating tablet DISSOLVE 1 TABLET IN MOUTH EVERY 8 HOURS AS NEEDED FOR NAUSEA OR VOMITING.   pantoprazole (PROTONIX) 40 MG tablet TAKE 1 TABLET BY MOUTH TWICE DAILY BEFORE A MEAL   sacubitril-valsartan (ENTRESTO) 24-26 MG Take 1 tablet by mouth 2 (two) times daily.   Vitamin D, Ergocalciferol, (DRISDOL) 1.25 MG (50000 UNIT) CAPS capsule Take 1 capsule (50,000 Units total) by mouth once a week.      Allergies:   Flagyl [metronidazole], Lansoprazole, and Pravastatin   Social History   Socioeconomic History   Marital  status: Married    Spouse name: Not on file   Number of children: 3   Years of education: Not on file   Highest education level: Not on file  Occupational History   Occupation: child care    Employer: [employer]  Tobacco Use   Smoking status: Never   Smokeless tobacco: Never  Vaping Use   Vaping Use: Never used  Substance and Sexual  Activity   Alcohol use: No    Alcohol/week: 0.0 standard drinks of alcohol   Drug use: No   Sexual activity: Yes    Partners: Male    Birth control/protection: None  Other Topics Concern   Not on file  Social History Narrative   Not on file   Social Determinants of Health   Financial Resource Strain: Not on file  Food Insecurity: Not on file  Transportation Needs: Not on file  Physical Activity: Not on file  Stress: Not on file  Social Connections: Not on file     Family History:  The patient's family history includes Bipolar disorder in her sister; Cancer in her mother; Colon cancer in her mother; Coronary artery disease in her brother, father, and mother; Diabetes in her father, maternal grandfather, and paternal grandmother; Diabetes type I in her father and mother; Heart disease in her paternal grandfather; Hyperlipidemia in her maternal grandmother; Hypertension in her brother, father, maternal grandfather, and maternal grandmother; Peripheral vascular disease in her mother; Stroke in her maternal grandmother.  ROS:   Please see the history of present illness.  All other systems are reviewed and otherwise negative.    EKG(s)/Additional Labs   EKG:  EKG is not ordered today but reviewed from 04/2022, NSR with LBBB. QTc hand calculated at 569m  Recent Labs: 05/01/2022: ALT 24; BUN 14; Potassium 4.0; Sodium 137 68/36/6294 Hemoglobin DUPLICATE REQUEST; Platelets DUPLICATE REQUEST 97/65/4650 Creatinine, Ser 0.90  Recent Lipid Panel    Component Value Date/Time   CHOL 158 10/19/2021 0820   TRIG 147 10/19/2021 0820   HDL 44  10/19/2021 0820   CHOLHDL 3.6 10/19/2021 0820   CHOLHDL 4.4 10/20/2018 0749   VLDL 36 06/04/2014 0919   LDLCALC 88 10/19/2021 0820   LDLCALC 98 10/20/2018 0749    PHYSICAL EXAM:    VS:  BP 122/78   Pulse 92   Ht _0  (1.626 m)   Wt 241 lb 9.6 oz (109.6 kg)   LMP 04/05/2016 Comment: spotting 11/2016 and 12/2016  SpO2 98%   BMI 41.47 kg/m   BMI: Body mass index is 41.47 kg/m.  GEN: Well nourished, well developed obese female in no acute distress HEENT: normocephalic, atraumatic Neck: no JVD, carotid bruits, or masses Cardiac: RRR; no murmurs, rubs, or gallops, no edema  Respiratory:  clear to auscultation bilaterally, normal work of breathing GI: soft, nontender, nondistended, + BS MS: no deformity or atrophy Skin: warm and dry, no rash Neuro:  Alert and Oriented x 3, Strength and sensation are intact, follows commands Psych: euthymic mood, full affect  Wt Readings from Last 3 Encounters:  08/23/22 241 lb 9.6 oz (109.6 kg)  07/03/22 241 lb (109.3 kg)  06/27/22 240 lb 11.2 oz (109.2 kg)     ASSESSMENT & PLAN:   1. NICM (nonischemic cardiomyopathy) - etiology unclear. Coronary CTA 07/2022 showed only mild nonobstructive CAD. Top differential dx includes LBBB cardiomyopathy, familial, infiltrative, stress-induced. She is also describing some joint stiffness though has a background history of chronic pain. Will check cardiomyopathy w/u labs with HIV, ANA, RF, iron studies and obtain cardiac MRI for further evaluation. She is nervous about the addition of further medicine. She thinks that EDelene Lollmight be causing some joint aches. I think it is more likely to be the Zetia than the Entresto. Will get CK with labs along with CBC, CMET as well. I would prefer we try to continue Entresto if we are able. With her issues with hypotension  earlier this year, will try Toprol 61m instead of carvedilol. Will bring her back in close follow-up to see how she is doing and discuss further med  titration. I doubt the cMRI will be back by then but she will need close follow-up for med adjustment as we are able. Would ideally like to see her on spironolactone and consideration of SGLT2i. She said she already feels like she is taking too much medication. Due to patient's perceived medication side effects, most helpful to add one med at a time. Also gave info sheet on 2g sodium restriction, 2L fluid restriction, daily weights.  2. Syncope, LBBB, prolonged QT interval - syncope was felt to be vasovagal in the setting of emotional stress. However, she has also been noted to have both electrical and structural disturbances in her cardiac workup with LBBB, prolonged QT interval (which may partly be affected by the wide QRS of 1476m, and LV dysfunction. I discussed her case with Dr. MeMyles Gipho felt that given her LBBB and cardiomyopathy, she may benefit from formal EPS to evaluate for inducible arrhythmia. I will refer to him for formal evaluation.  He felt that she can be trialed on beta blocker therapy for her heart failure. I will also reach out to Dr. SkMarlou Porchor his input on the patient's QT interval in the meantime. I've sent for electrolytes today. If her LV dysfunction persists despite medical optimization, may need to consider CRT-D.  3. Nonobstructive CAD, Atherosclerosis of aorta - continue risk factor modification. I will discuss ASA therapy with Dr. SkMarlou PorchAdd beta blocker. Continue statin. Will try Zetia holiday to see if this was causing the increased muscle stiffness. Also discussed seeing PCP back in follow-up for general assessment of whether there would be any rheumatologic concerns now that she is also found to have cardiomyopathy. Would have low threshold to refer to pharmD for management in follow-up.   4. Essential hypertension - follow BP with addition of Toprol. Do not suspect it will make a big difference in BP. She is worried to lower it again.  5. PSVT (paroxysmal  supraventricular tachycardia) - noted on event monitor. This seems generally asymptomatic. Follow clinically with initiation of BB.  Addendum: plan reviewed with Dr. SkMarlou PorchPer his recommendations he would not adjust background meds for current QT (borderline with underlying LBBB), no specific activity restrictions given suspected vagal syncope and reassuring Zio, and otherwise agree with plan above with addition of Toprol and consideration of spiro and Jardiance moving forward.     Disposition: F/u with [facility] in 2 weeks for further med titration (pharmD booked out 6 weeks, I have no [place] clinic availability until a month from now). Pt would eventually like to f/u towards [place].   Medication Adjustments/Labs and Tests Ordered: Current medicines are reviewed at length with the patient today.  Concerns regarding medicines are outlined above. Medication changes, Labs and Tests ordered today are summarized above and listed in the Patient Instructions accessible in Encounters.   Signed, DaCharlie PitterPA-C  08/23/2022 3:35 PM    [place]: [phone]: [phone]

## 2022-08-23 ENCOUNTER — Ambulatory Visit: Payer: 59 | Attending: Physician Assistant | Admitting: Physician Assistant

## 2022-08-23 ENCOUNTER — Encounter: Payer: Self-pay | Admitting: Physician Assistant

## 2022-08-23 VITALS — BP 122/78 | HR 92 | Ht 64.0 in | Wt 241.6 lb

## 2022-08-23 DIAGNOSIS — I10 Essential (primary) hypertension: Secondary | ICD-10-CM | POA: Diagnosis not present

## 2022-08-23 DIAGNOSIS — I428 Other cardiomyopathies: Secondary | ICD-10-CM | POA: Diagnosis not present

## 2022-08-23 DIAGNOSIS — I447 Left bundle-branch block, unspecified: Secondary | ICD-10-CM

## 2022-08-23 DIAGNOSIS — I471 Supraventricular tachycardia, unspecified: Secondary | ICD-10-CM

## 2022-08-23 DIAGNOSIS — R55 Syncope and collapse: Secondary | ICD-10-CM

## 2022-08-23 DIAGNOSIS — I700 Atherosclerosis of aorta: Secondary | ICD-10-CM

## 2022-08-23 DIAGNOSIS — I2510 Atherosclerotic heart disease of native coronary artery without angina pectoris: Secondary | ICD-10-CM

## 2022-08-23 MED ORDER — METOPROLOL SUCCINATE ER 25 MG PO TB24: 25.0000 mg | ORAL_TABLET | Freq: Every day | ORAL | 1 refills | Status: DC

## 2022-08-23 NOTE — Patient Instructions (Addendum)
Medication Instructions:   STOP TAKING ZETIA NOW  START TAKING METOPROLOL SUCCINATE (TOPROL XL) 25 MG BY MOUTH DAILY  *If you need a refill on your cardiac medications before your next appointment, please call your pharmacy*   You have been referred to SEE ELECTROPHYSIOLOGIST DR. [place] YOU TO MAKE THIS APPOINTMENT   Lab Work:  TODAY DOWNSTAIRS AT [facility]--CMET, MAGNESIUM LEVEL, CBC, TSH, HIV ANTIBODY W/REFLEX, IRON PANEL, ANA, RHEUMATOID FACTOR, AND CK LEVEL  If you have labs (blood work) drawn today and your tests are completely normal, you will receive your results only by: [place] (if you have MyChart) OR A paper copy in the mail If you have any lab test that is abnormal or we need to change your treatment, we will call you to review the results.   Testing/Procedures:    You are scheduled for Cardiac MRI on ______________. Please arrive for your appointment at ______________ ( arrive 30-45 minutes prior to test start time). ?  [facility] [address] [place],  [address] [phone] Please take advantage of the free valet parking available at the MAIN entrance (A entrance).  Proceed to the [facility] Radiology Department (First Floor) for check-in.    Magnetic resonance imaging (MRI) is a painless test that produces images of the inside of the body without using Xrays.  During an MRI, strong magnets and radio waves work together in a Research officer, political party to form detailed images.   MRI images may provide more details about a medical condition than X-rays, CT scans, and ultrasounds can provide.  You may be given earphones to listen for instructions.  You may eat a light breakfast and take medications as ordered with the exception of HCTZ (fluid pill, other). Please avoid stimulants for 12 hr prior to test. (Ie. Caffeine, nicotine, chocolate, or antihistamine  medications)  If a contrast material will be used, an IV will be inserted into one of your veins. Contrast material will be injected into your IV. It will leave your body through your urine within a day. You may be told to drink plenty of fluids to help flush the contrast material out of your system.  You will be asked to remove all metal, including: Watch, jewelry, and other metal objects including hearing aids, hair pieces and dentures. Also wearable glucose monitoring systems (ie. Freestyle Libre and Omnipods) (Braces and fillings normally are not a problem.)   TEST WILL TAKE APPROXIMATELY 1 HOUR  PLEASE NOTIFY SCHEDULING AT LEAST 24 HOURS IN ADVANCE IF YOU ARE UNABLE TO KEEP YOUR APPOINTMENT. [phone]  Please call Marchia Bond, cardiac imaging nurse navigator with any questions/concerns. Marchia Bond RN Navigator Cardiac Imaging Gordy Clement RN Navigator Cardiac Imaging [facility] [phone] Office     Follow-Up:  WITH VIN BHAGAT PA-C ON 09/06/22 AT 3:35 PM HERE AT [place]    Important Information About Sugar

## 2022-08-24 LAB — COMPREHENSIVE METABOLIC PANEL
ALT: 16 IU/L (ref 0–32)
AST: 16 IU/L (ref 0–40)
Albumin/Globulin Ratio: 1.6 (ref 1.2–2.2)
Albumin: 4.4 g/dL (ref 3.8–4.9)
Alkaline Phosphatase: 65 IU/L (ref 44–121)
BUN/Creatinine Ratio: 9 (ref 9–23)
BUN: 9 mg/dL (ref 6–24)
Bilirubin Total: 0.4 mg/dL (ref 0.0–1.2)
CO2: 28 mmol/L (ref 20–29)
Calcium: 9 mg/dL (ref 8.7–10.2)
Chloride: 102 mmol/L (ref 96–106)
Creatinine, Ser: 1.02 mg/dL — ABNORMAL HIGH (ref 0.57–1.00)
Globulin, Total: 2.8 g/dL (ref 1.5–4.5)
Glucose: 109 mg/dL — ABNORMAL HIGH (ref 70–99)
Potassium: 4.3 mmol/L (ref 3.5–5.2)
Sodium: 142 mmol/L (ref 134–144)
Total Protein: 7.2 g/dL (ref 6.0–8.5)
eGFR: 65 mL/min/{1.73_m2} (ref >59)

## 2022-08-24 LAB — MAGNESIUM: Magnesium: 2 mg/dL (ref 1.6–2.3)

## 2022-08-24 LAB — HIV ANTIBODY (ROUTINE TESTING W REFLEX): HIV Screen 4th Generation wRfx: NONREACTIVE

## 2022-08-24 LAB — IRON,TIBC AND FERRITIN PANEL
Ferritin: 37 ng/mL (ref 15–150)
Iron Saturation: 12 % — ABNORMAL LOW (ref 15–55)
Iron: 38 ug/dL (ref 27–159)
Total Iron Binding Capacity: 316 ug/dL (ref 250–450)
UIBC: 278 ug/dL (ref 131–425)

## 2022-08-24 LAB — CBC
Hematocrit: 40.4 % (ref 34.0–46.6)
Hemoglobin: 13.2 g/dL (ref 11.1–15.9)
MCH: 27.5 pg (ref 26.6–33.0)
MCHC: 32.7 g/dL (ref 31.5–35.7)
MCV: 84 fL (ref 79–97)
Platelets: 230 10*3/uL (ref 150–450)
RBC: 4.8 x10E6/uL (ref 3.77–5.28)
RDW: 14.3 % (ref 11.7–15.4)
WBC: 6.5 10*3/uL (ref 3.4–10.8)

## 2022-08-24 LAB — CK: Total CK: 65 U/L (ref 32–182)

## 2022-08-24 LAB — RHEUMATOID FACTOR: Rheumatoid fact SerPl-aCnc: 16.8 IU/mL — ABNORMAL HIGH (ref <14.0)

## 2022-08-24 LAB — TSH: TSH: 2.33 u[IU]/mL (ref 0.450–4.500)

## 2022-08-24 LAB — ANA: Anti Nuclear Antibody (ANA): NEGATIVE

## 2022-08-26 ENCOUNTER — Other Ambulatory Visit: Payer: Self-pay | Admitting: Family Medicine

## 2022-08-27 ENCOUNTER — Telehealth: Payer: Self-pay

## 2022-08-27 NOTE — Telephone Encounter (Signed)
Pt called and she needs a new Rx for her Pantoprazole to be taken X 2 a day sent to [facility] because her insurance changed to [place]. So I have to do another PA if needed.

## 2022-08-28 ENCOUNTER — Ambulatory Visit (INDEPENDENT_AMBULATORY_CARE_PROVIDER_SITE_OTHER): Payer: 59 | Admitting: Family Medicine

## 2022-08-28 VITALS — BP 123/77 | HR 82 | Temp 97.7°F | Ht 64.0 in | Wt 241.0 lb

## 2022-08-28 DIAGNOSIS — I700 Atherosclerosis of aorta: Secondary | ICD-10-CM

## 2022-08-28 DIAGNOSIS — I2510 Atherosclerotic heart disease of native coronary artery without angina pectoris: Secondary | ICD-10-CM | POA: Diagnosis not present

## 2022-08-28 DIAGNOSIS — I428 Other cardiomyopathies: Secondary | ICD-10-CM | POA: Diagnosis not present

## 2022-08-28 DIAGNOSIS — E782 Mixed hyperlipidemia: Secondary | ICD-10-CM

## 2022-08-28 DIAGNOSIS — I10 Essential (primary) hypertension: Secondary | ICD-10-CM

## 2022-08-28 DIAGNOSIS — E119 Type 2 diabetes mellitus without complications: Secondary | ICD-10-CM

## 2022-08-28 DIAGNOSIS — Z23 Encounter for immunization: Secondary | ICD-10-CM | POA: Diagnosis not present

## 2022-08-28 MED ORDER — PANTOPRAZOLE SODIUM 40 MG PO TBEC: 40.0000 mg | DELAYED_RELEASE_TABLET | Freq: Two times a day (BID) | ORAL | 3 refills | Status: DC

## 2022-08-28 NOTE — Addendum Note (Signed)
Addended by: Annitta Needs on: 08/28/2022 01:50 PM   Modules accepted: Orders

## 2022-08-28 NOTE — Telephone Encounter (Signed)
Completed.

## 2022-08-28 NOTE — Patient Instructions (Signed)
Continue your medications.  Follow up in 3 months.  Call with concerns.  Take care  Dr. Lacinda Axon

## 2022-08-29 DIAGNOSIS — I428 Other cardiomyopathies: Secondary | ICD-10-CM | POA: Insufficient documentation

## 2022-08-29 DIAGNOSIS — I700 Atherosclerosis of aorta: Secondary | ICD-10-CM | POA: Insufficient documentation

## 2022-08-29 DIAGNOSIS — I2510 Atherosclerotic heart disease of native coronary artery without angina pectoris: Secondary | ICD-10-CM | POA: Insufficient documentation

## 2022-08-29 NOTE — Assessment & Plan Note (Signed)
BP well controlled.  Continue current medications.

## 2022-08-29 NOTE — Assessment & Plan Note (Signed)
Continue Lipitor and Zetia.  Needs lipid panel.

## 2022-08-29 NOTE — Assessment & Plan Note (Signed)
Needs A1c.  Continue current medications.

## 2022-08-29 NOTE — Addendum Note (Signed)
Addended by: [name] on: 08/29/2022 01:22 PM   Modules accepted: Orders

## 2022-08-29 NOTE — Progress Notes (Signed)
Subjective:  Patient ID: [name], female    DOB: Nov 02, 1966  Age: 56 y.o. MRN: [id number]  CC: Chief Complaint  Patient presents with   3 month follow up     Recent heart failure diagnoses    HPI:  56 year old female with history of syncope, recently found to have nonischemic cardiomyopathy, type 2 diabetes, fatty liver, GERD, mood disorder, chronic pain syndrome, hyperlipidemia presents for follow-up.  Patient states that overall she is doing okay.  She has been struggling with some joint aches and pains.  I suspect this is due to her lipid-lowering medication.  She has been stressed due to all of the work-up and new issues that have come about.  During work-up of syncope, patient found to have decreased ejection fraction.  Has mild coronary artery disease but this appears to be nonischemic.  Being followed closely by cardiology.  Is now on Entresto, Metoprolol.  Has been referred to EP.   LDL is not at goal given the fact that she has vascular disease.  Needs updated lipid panel.  Is currently on Zetia and atorvastatin.  DM-2 has been well controlled. Needs A1C.  He is on metformin and Trulicity.   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   Fatty liver 09/06/2016   H/O adenomatous polyp of colon 01/24/2012   FH: colon cancer 01/24/2012   GERD (gastroesophageal reflux disease) 06/22/2011   Hyperlipidemia 06/22/2011   Essential hypertension 06/22/2011   Obesity 06/22/2011   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[place]) 05/22/2011    Social Hx   Social  History   Socioeconomic History   Marital status: Married    Spouse name: Not on file   Number of children: 3   Years of education: Not on file   Highest education level: Not on file  Occupational History   Occupation: child care    Employer: [employer]  Tobacco Use   Smoking status: Never   Smokeless tobacco: Never  Vaping Use   Vaping Use: Never used  Substance and Sexual Activity   Alcohol use: No    Alcohol/week: 0.0 standard drinks of alcohol   Drug use: No   Sexual activity: Yes    Partners: Male    Birth control/protection: None  Other Topics Concern   Not on file  Social History Narrative   Not on file   Social Determinants of Health   Financial Resource Strain: Not on file  Food Insecurity: Not on file  Transportation Needs: Not on file  Physical Activity: Not on file  Stress: Not on file  Social Connections: Not on file    Review of Systems  Constitutional: Negative.   Cardiovascular:  Negative for chest pain.   Objective:  BP 123/77   Pulse 82   Temp 97.7 F (36.5 C)   Ht '5\' 4"'$  (1.626 m)   Wt 241 lb (109.3 kg)   LMP 04/05/2016 Comment: spotting 11/2016 and 12/2016  SpO2 97%   BMI 41.37 kg/m      08/28/2022    3:35 PM 08/23/2022    3:27 PM 07/31/2022  4:28 PM  BP/Weight  Systolic BP 161 096 045  Diastolic BP 77 78 70  Wt. (Lbs) 241 241.6   BMI 41.37 kg/m2 41.47 kg/m2     Physical Exam Vitals and nursing note reviewed.  Constitutional:      Appearance: Normal appearance. She is obese.  HENT:     Head: Normocephalic and atraumatic.  Eyes:     General:        Right eye: No discharge.        Left eye: No discharge.     Conjunctiva/sclera: Conjunctivae normal.  Cardiovascular:     Rate and Rhythm: Normal rate and regular rhythm.  Pulmonary:     Effort: Pulmonary effort is normal.     Breath sounds: Normal breath sounds. No wheezing or rales.  Neurological:     Mental Status: She is alert.  Psychiatric:        Mood and  Affect: Mood normal.        Behavior: Behavior normal.     Lab Results  Component Value Date   WBC 6.5 08/23/2022   HGB 13.2 08/23/2022   HCT 40.4 08/23/2022   PLT 230 08/23/2022   GLUCOSE 109 (H) 08/23/2022   CHOL 158 10/19/2021   TRIG 147 10/19/2021   HDL 44 10/19/2021   LDLCALC 88 10/19/2021   ALT 16 08/23/2022   AST 16 08/23/2022   NA 142 08/23/2022   K 4.3 08/23/2022   CL 102 08/23/2022   CREATININE 1.02 (H) 08/23/2022   BUN 9 08/23/2022   CO2 28 08/23/2022   TSH 2.330 08/23/2022   HGBA1C 6.1 (H) 10/19/2021   MICROALBUR 1.2 10/20/2018     Assessment & Plan:   Problem List Items Addressed This Visit       Cardiovascular and Mediastinum   Aortic atherosclerosis ([place])   Relevant Medications   ezetimibe (ZETIA) 10 MG tablet   CAD (coronary artery disease)   Relevant Medications   ezetimibe (ZETIA) 10 MG tablet   Essential hypertension    BP well controlled.  Continue current medications.      Relevant Medications   ezetimibe (ZETIA) 10 MG tablet   NICM (nonischemic cardiomyopathy) ([place]) - Primary    Patient not happy about recent addition of medications.  Advised that she needs to stay compliant.  Continue current medications.      Relevant Medications   ezetimibe (ZETIA) 10 MG tablet     Endocrine   Diabetes mellitus without complication ([facility])    Needs A1c.  Continue current medications.        Other   Hyperlipidemia    Continue Lipitor and Zetia.  Needs lipid panel.      Relevant Medications   ezetimibe (ZETIA) 10 MG tablet   Other Visit Diagnoses     Immunization due       Relevant Orders   Flu Vaccine QUAD 80moIM (Fluarix, Fluzone & Alfiuria Quad PF) (Completed)       Follow-up:  Return in about 3 months (around 11/28/2022).  [place]

## 2022-08-29 NOTE — Assessment & Plan Note (Signed)
Patient not happy about recent addition of medications.  Advised that she needs to stay compliant.  Continue current medications.

## 2022-08-31 ENCOUNTER — Telehealth: Payer: Self-pay

## 2022-08-31 ENCOUNTER — Ambulatory Visit: Payer: 59 | Admitting: Physical Medicine and Rehabilitation

## 2022-08-31 MED ORDER — HYDROCODONE-ACETAMINOPHEN 5-325 MG PO TABS: 1.0000 | ORAL_TABLET | Freq: Four times a day (QID) | ORAL | 0 refills | Status: DC | PRN

## 2022-08-31 NOTE — Telephone Encounter (Signed)
Patient called for a refill on Hydrocodone/APAP 5/325 mg. I called patient back and left VM asking her to call her pharmacy and make sure they had the medication in stock has it is on national back order. And if not at her pharmacy call other pharmacies to see what is in stock. I called her pharmacy [facility] and they do not have it in stock.

## 2022-08-31 NOTE — Telephone Encounter (Signed)
Patient called back stating that [facility] in [place] has the medication in stock. Can you send it there? Dr. Florentina Jenny patient, LF #[id number] on 08/02/22.

## 2022-08-31 NOTE — Telephone Encounter (Signed)
PMP was Reviewed.  Dr Dagoberto Ligas Note was Reviewed Hydrocodone e-scribed to [facility].  Call placed to Ms. Olafson regarding the above, she verbalizes understanding.

## 2022-09-01 ENCOUNTER — Other Ambulatory Visit: Payer: Self-pay | Admitting: Gastroenterology

## 2022-09-03 NOTE — Telephone Encounter (Signed)
No it's a different insurance and if a PA comes over then I will do it. When it is X 2 a day it has to be another Rx done.

## 2022-09-03 NOTE — Telephone Encounter (Signed)
[name], is patient's PPI not covered? I would rather not change if doing well on pantoprazole that I sent in.

## 2022-09-03 NOTE — Telephone Encounter (Signed)
Phoned and spoke with the pt and yes she is doing good with medication so I will do a PA on it because per pt she spoke with the insurance [employer] and she is only allowed 90 per calendar year

## 2022-09-05 NOTE — Telephone Encounter (Signed)
Pt approved for Pantoprazole 40 mg DR tablets. Pt made aware

## 2022-09-05 NOTE — Telephone Encounter (Signed)
Given to Manuela Schwartz to scan

## 2022-09-06 ENCOUNTER — Encounter: Payer: Self-pay | Admitting: Physician Assistant

## 2022-09-06 ENCOUNTER — Ambulatory Visit: Payer: 59 | Attending: Physician Assistant | Admitting: Physician Assistant

## 2022-09-06 VITALS — BP 108/80 | HR 87 | Ht 64.0 in | Wt 239.8 lb

## 2022-09-06 DIAGNOSIS — I10 Essential (primary) hypertension: Secondary | ICD-10-CM

## 2022-09-06 DIAGNOSIS — I428 Other cardiomyopathies: Secondary | ICD-10-CM

## 2022-09-06 DIAGNOSIS — I700 Atherosclerosis of aorta: Secondary | ICD-10-CM

## 2022-09-06 DIAGNOSIS — I2510 Atherosclerotic heart disease of native coronary artery without angina pectoris: Secondary | ICD-10-CM

## 2022-09-06 DIAGNOSIS — R55 Syncope and collapse: Secondary | ICD-10-CM

## 2022-09-06 DIAGNOSIS — E782 Mixed hyperlipidemia: Secondary | ICD-10-CM

## 2022-09-06 DIAGNOSIS — I447 Left bundle-branch block, unspecified: Secondary | ICD-10-CM | POA: Diagnosis not present

## 2022-09-06 DIAGNOSIS — I471 Supraventricular tachycardia, unspecified: Secondary | ICD-10-CM

## 2022-09-06 MED ORDER — EMPAGLIFLOZIN 10 MG PO TABS: 10.0000 mg | ORAL_TABLET | Freq: Every day | ORAL | 3 refills | Status: DC

## 2022-09-06 MED ORDER — EZETIMIBE 10 MG PO TABS: 10.0000 mg | ORAL_TABLET | Freq: Every day | ORAL | 3 refills | Status: DC

## 2022-09-06 MED ORDER — METOPROLOL SUCCINATE ER 50 MG PO TB24: 50.0000 mg | ORAL_TABLET | Freq: Every day | ORAL | 3 refills | Status: DC

## 2022-09-06 NOTE — Patient Instructions (Signed)
Medication Instructions:  RESTART Zetia '10mg'$  Take 1 tablet daily   INCREASE '50mg'$  take 1 tablet once a day START Jardiance '10mg'$  Take 1 tablet once a day  *If you need a refill on your cardiac medications before your next appointment, please call your pharmacy*   Lab Work: TODAY-BMET If you have labs (blood work) drawn today and your tests are completely normal, you will receive your results only by: [place] (if you have MyChart) OR A paper copy in the mail If you have any lab test that is abnormal or we need to change your treatment, we will call you to review the results.   Testing/Procedures: NONE   Follow-Up: At [facility], you and your health needs are our priority.  As part of our continuing mission to provide you with exceptional heart care, we have created designated Provider Care Teams.  These Care Teams include your primary Cardiologist (physician) and Advanced Practice Providers (APPs -  Physician Assistants and Nurse Practitioners) who all work together to provide you with the care you need, when you need it.  We recommend signing up for the patient portal called "MyChart".  Sign up information is provided on this After Visit Summary.  MyChart is used to connect with patients for Virtual Visits (Telemedicine).  Patients are able to view lab/test results, encounter notes, upcoming appointments, etc.  Non-urgent messages can be sent to your provider as well.   To learn more about what you can do with MyChart, go to [email or web address].    Your next appointment:   1-2 month(s)  The format for your next appointment:   In Person  Provider:   Melina Copa, PA-C in [facility] or another APP   Other Instructions   Important Information About Sugar

## 2022-09-06 NOTE — Progress Notes (Signed)
Cardiology Office Note:    Date:  09/06/2022   ID:  [name], DOB 27-Dec-1965, MRN [id number]  PCP:  Jill Spikes, DO  [facility] Cardiologist:  Jill Furbish, MD  [place] Electrophysiologist:  None   Chief Complaint: medication titration   History of Present Illness:    [name] is a 56 y.o. female with a hx of DM, breast CA s/p lumpectomy/chemo/xrt, anxiety, depression, DDD, diverticulitis, GERD, HTN, HLD, IDA, obesity, LBBB (since 2022), syncope, recently diagnosed NICM, mild CAD, aortic atherosclerosis, PSVT who is seen for follow-up.   She was remotely evaluated in 2018 for chest pain with emotional stress. Nuclear stress test was felt reassuring with abnormality felt d/t breast attenuation rather than ischemia.    She re-established care with Dr. Marlou [name] in 06/2022 for evaluation of syncope. In June of this year her sister was actively dying from breast cancer. She was on the phone with her family who helped put the phone to her sister so she could say goodbye. 5 minutes later her sister passed away. When the patient got the news, the next thing she knew, she had blacked out for just a moment. This was witnessed by her husband. There was no seizure activity, b/b incontinence, or post-ictal state. She instantly remembered her sister had died and began hyperventilating. Within a few moments she felt normal. She didn't seek care immediately but in close follow-up had BP 98 systolic so lisinopril was decreased (then eventually stopped). The suspicion was vasovagal. Dr. Marlou [name] agreed this was likely the case in his visit 06/2022. No specific driving restrictions implicated. He did note she'd had a LBBB on EKG that had been identified in 08/2021. Subsequent echo 07/10/22 showed poor acoustic windows but with EF 30-35%, severe hypokinesis/akinesis of the septal wall; hypokinesis of the anterior and inferolateral wall, mild LVE, mild concentric LVH, mild MR. Cor CT showed CAC 141  (96%ile) with mild nonobstructive disease as outlined below. Zetia was added. 14 day Zio showed predominant NSR range 56-182 with average HR 98bpm, 9 SVT runs (longest lasting 45 seconds), rare PAC/PVCs, no dangerous arrhythmias. She was started on Entresto with plan for f/u.    Seen by [name] 08/23/22. Doing well. Plan to titrate GDMT. Pending cMRI.  "Addendum: plan reviewed with Dr. Marlou [name]. Per his recommendations he would not adjust background meds for current QT (borderline with underlying LBBB), no specific activity restrictions given suspected vagal syncope and reassuring Zio, and otherwise agree with plan above with addition of Toprol and consideration of spiro and Jardiance moving forward".   Recommended to follow up with PCP for abnormal RF and iron level.   Here today for follow up.  No improved muscle stiffness on her legs despite discontinuing Zetia.  Denies chest pain, palpitation, orthopnea, PND, syncope, lower extremity edema or melena.  Has intermittent shortness of breath.  Discussed with PCP regarding abnormal labs.  Recommended watchful waiting.  Past Medical History:  Diagnosis Date   Anxiety    Arthritis    Blood transfusion without reported diagnosis 1999   due to heavy menses   Breast cancer ([place]) 05/22/2011   03/01/11, Stage 2, s/p lumpectomy, chemo/xrt   Complication of anesthesia    pt woke up during procedure   DDD (degenerative disc disease), lumbar    Depression 06/22/2011   Diverticulitis    DM (diabetes mellitus) ([place]) 06/22/2011   Genital warts    GERD (gastroesophageal reflux disease) 06/22/2011   Hx of adenomatous colonic  polyps 10/2007   18m sigmoid tubular adenoma, FH colon cancer, mother in mid-50s   Hypercholesterolemia 06/22/2011   Hypertension 06/22/2011   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[facility] 05/22/2011   Iron deficiency anemia 03/25/2015   LBBB (left bundle branch block)    Mild CAD    Morbid obesity ([facility])    NICM (nonischemic  cardiomyopathy) ([place]    PSVT (paroxysmal supraventricular tachycardia)    Shortness of breath    STD (sexually transmitted disease)    HPV, 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   Syncope    Syncope    Vaginal bleeding 03/08/2014    Past Surgical History:  Procedure Laterality Date   back surg x2     BALLOON DILATION N/A 09/22/2021   Procedure: BALLOON DILATION;  Surgeon: CEloise Harman DO;  [facility];  Service: Endoscopy;  Laterality: N/A;   BIOPSY  09/22/2021   Procedure: BIOPSY;  Surgeon: CEloise Harman DO;  [facility];  Service: Endoscopy;;   BREAST LUMPECTOMY Right 2012   CHOLECYSTECTOMY     COLONOSCOPY  11/03/07   4-mm sessile polyp removed/small internal hemorrhoids/tubular adenoma, random colon bx negative for microscopic colitis   COLONOSCOPY WITH PROPOFOL N/A 09/17/2016   Grade 2 hemorrhoids, colonic diverticulosis, ascending colon polyp (sessile serrated adenoma), 5 year surveillance   COLONOSCOPY WITH PROPOFOL N/A 09/22/2021   Procedure: COLONOSCOPY WITH PROPOFOL;  Surgeon: CEloise Harman DO;  [facility];  Service: Endoscopy;  Laterality: N/A;  7:30am   COLONOSCOPY, ESOPHAGOGASTRODUODENOSCOPY (EGD) AND ESOPHAGEAL DILATION  01/2012   mild gastritis s/p biopsy. Empiric dilation. Normal duodenum.    DILATATION & CURETTAGE/HYSTEROSCOPY WITH MYOSURE N/A 02/10/2015   Procedure: DILATATION & CURETTAGE/HYSTEROSCOPY WITH MYOSURE;  Surgeon: BNunzio Cobbs MD;  [facility];  Service: Gynecology;  Laterality: N/A;   ESOPHAGOGASTRODUODENOSCOPY (EGD) WITH PROPOFOL N/A 01/11/2020   Normal esophagus, s/p dilation, normal stomach, normal duodenum.    ESOPHAGOGASTRODUODENOSCOPY (EGD) WITH PROPOFOL N/A 09/22/2021   Procedure: ESOPHAGOGASTRODUODENOSCOPY (EGD) WITH PROPOFOL;  Surgeon: CEloise Harman DO;  [facility];  Service: Endoscopy;  Laterality: N/A;   MALONEY DILATION N/A 01/11/2020   Procedure: MVenia MinksDILATION;   Surgeon: RDaneil Dolin MD;  [facility];  Service: Endoscopy;  Laterality: N/A;   MM BREAST STEREO BX*L*R/S     rt.   POLYPECTOMY  09/17/2016   Procedure: POLYPECTOMY;  Surgeon: RDaneil Dolin MD;  [facility];  Service: Endoscopy;;  ascending colon   POLYPECTOMY  09/22/2021   Procedure: POLYPECTOMY;  Surgeon: CEloise Harman DO;  [facility];  Service: Endoscopy;;   PORT-A-CATH REMOVAL  05/26/2012   Procedure: REMOVAL PORT-A-CATH;  Surgeon: MJamesetta So MD;  [facility];  Service: General;  Laterality: N/A;  Minor Room   PORTACATH PLACEMENT      Current Medications: Current Meds  Medication Sig   atorvastatin (LIPITOR) 40 MG tablet TAKE 1 TABLET BY MOUTH EVERYDAY AT BEDTIME   blood glucose meter kit and supplies Dispense based on patient and insurance preference. Use up to four times daily as directed. (FOR ICD-10 E10.9, E11.9).   cetirizine (ZYRTEC) 10 MG tablet TAKE 1 TABLET BY MOUTH EVERY DAY   citalopram (CELEXA) 40 MG tablet TAKE 1 TABLET BY MOUTH EVERY DAY   Dulaglutide (TRULICITY) 03.64MWO/0.3OZSOPN INJECT 0.75 MG SUBCUTANEOUSLY ONCE A WEEK   DULoxetine (CYMBALTA) 30 MG capsule Take 1 capsule (30 mg total)  by mouth daily.   fluticasone (FLONASE) 50 MCG/ACT nasal spray USE 2 SPRAY(S) IN EACH NOSTRIL ONCE DAILY AS NEEDED   HYDROcodone-acetaminophen (NORCO/VICODIN) 5-325 MG tablet Take 1 tablet by mouth every 6 (six) hours as needed for severe pain.   metFORMIN (GLUCOPHAGE-XR) 750 MG 24 hr tablet TAKE 1 TABLET BY MOUTH EVERY DAY WITH BREAKFAST   metoprolol succinate (TOPROL XL) 25 MG 24 hr tablet Take 1 tablet (25 mg total) by mouth daily.   Multiple Vitamin (MULTIVITAMIN) capsule Take 1 capsule by mouth daily.   Omega-3 Fatty Acids (FISH OIL) 1000 MG CAPS Take 1,000 mg by mouth daily.   ondansetron (ZOFRAN-ODT) 8 MG disintegrating tablet DISSOLVE 1 TABLET IN MOUTH EVERY 8 HOURS AS NEEDED FOR NAUSEA OR VOMITING.   pantoprazole  (PROTONIX) 40 MG tablet Take 1 tablet (40 mg total) by mouth 2 (two) times daily before a meal.   sacubitril-valsartan (ENTRESTO) 24-26 MG Take 1 tablet by mouth 2 (two) times daily.   Vitamin D, Ergocalciferol, (DRISDOL) 1.25 MG (50000 UNIT) CAPS capsule Take 1 capsule (50,000 Units total) by mouth once a week.     Allergies:   Flagyl [metronidazole], Lansoprazole, and Pravastatin   Social History   Socioeconomic History   Marital status: Married    Spouse name: Not on file   Number of children: 3   Years of education: Not on file   Highest education level: Not on file  Occupational History   Occupation: child care    Employer: [employer]  Tobacco Use   Smoking status: Never   Smokeless tobacco: Never  Vaping Use   Vaping Use: Never used  Substance and Sexual Activity   Alcohol use: No    Alcohol/week: 0.0 standard drinks of alcohol   Drug use: No   Sexual activity: Yes    Partners: Male    Birth control/protection: None  Other Topics Concern   Not on file  Social History Narrative   Not on file   Social Determinants of Health   Financial Resource Strain: Not on file  Food Insecurity: Not on file  Transportation Needs: Not on file  Physical Activity: Not on file  Stress: Not on file  Social Connections: Not on file     Family History: The patient's family history includes Bipolar disorder in her sister; Cancer in her mother; Colon cancer in her mother; Coronary artery disease in her brother, father, and mother; Diabetes in her father, maternal grandfather, and paternal grandmother; Diabetes type I in her father and mother; Heart disease in her paternal grandfather; Hyperlipidemia in her maternal grandmother; Hypertension in her brother, father, maternal grandfather, and maternal grandmother; Peripheral vascular disease in her mother; Stroke in her maternal grandmother.    ROS:   Please see the history of present illness.    All other systems reviewed and  are negative.   EKGs/Labs/Other Studies Reviewed:    The following studies were reviewed today:  Monitor 07/2022 Normal sinus rhythm avg HR 98 bpm   Atrial tachycardia - benign   Rare PAC/PVC   No atrial fibrillation   Symptoms triggered were associiated with sinus rhythm/mild tachycardia - benign     Patch Wear Time:  13 days and 21 hours (2023-08-28T20:33:30-0400 to 2023-09-11T17:37:39-0400)   Patient had a min HR of 56 bpm, max HR of 182 bpm, and avg HR of 98 bpm. Predominant underlying rhythm was Sinus Rhythm. Bundle Branch Block/IVCD was present. 9 Supraventricular Tachycardia runs occurred, the run with the fastest  interval lasting 18  beats with a max rate of 182 bpm, the longest lasting 45.0 secs with an avg rate of 108 bpm. Isolated SVEs were rare (<1.0%), SVE Couplets were rare (<1.0%), and SVE Triplets were rare (<1.0%). Isolated VEs were rare (<1.0%, 889), VE Couplets were rare  (<1.0%, 4), and VE Triplets were rare (<1.0%, 1).    Coronary CT 07/2022 IMPRESSION: 1. Coronary calcium score of 141. This was 61 percentile for age-, sex, and race-matched controls.   2. Normal coronary origin with right dominance.   3. Mild CAD as outlined above.   4. Aortic atherosclerosis   RECOMMENDATIONS: CAD-RADS 2: Mild non-obstructive CAD (25-49%). Consider non-atherosclerotic causes of chest pain. Consider preventive therapy and risk factor modification.    Echo 06/2022 1. Poor acoustic windows limit study. Definity used. LVEF is depressed  with severe hypokinesis/akinesis of the septal wall; hypokinesis of the  anterior and inferolateral wall. Left ventricular ejection fraction, by  estimation, is 30 to 35%. The left  ventricle has moderately decreased function. The left ventricular internal  cavity size was mildly dilated. There is mild concentric left ventricular  hypertrophy. Indeterminate diastolic filling due to E-A fusion.   2. Right ventricular systolic function is  normal. The right ventricular  size is normal. There is normal pulmonary artery systolic pressure.   3. The mitral valve is normal in structure. Mild mitral valve  regurgitation.   4. The aortic valve is tricuspid. Aortic valve regurgitation is not  visualized.   5. The inferior vena cava is normal in size with greater than 50%  respiratory variability, suggesting right atrial pressure of 3 mmHg.   EKG:  EKG is not  ordered today.     Recent Labs: 08/23/2022: ALT 16; BUN 9; Creatinine, Ser 1.02; Hemoglobin 13.2; Magnesium 2.0; Platelets 230; Potassium 4.3; Sodium 142; TSH 2.330  Recent Lipid Panel    Component Value Date/Time   CHOL 158 10/19/2021 0820   TRIG 147 10/19/2021 0820   HDL 44 10/19/2021 0820   CHOLHDL 3.6 10/19/2021 0820   CHOLHDL 4.4 10/20/2018 0749   VLDL 36 06/04/2014 0919   LDLCALC 88 10/19/2021 0820   LDLCALC 98 10/20/2018 0749     Physical Exam:    VS:  BP 108/80   Pulse 87   Ht _0  (1.626 m)   Wt 239 lb 12.8 oz (108.8 kg)   LMP 04/05/2016 Comment: spotting 11/2016 and 12/2016  SpO2 97%   BMI 41.16 kg/m     Wt Readings from Last 3 Encounters:  09/06/22 239 lb 12.8 oz (108.8 kg)  08/28/22 241 lb (109.3 kg)  08/23/22 241 lb 9.6 oz (109.6 kg)     GEN:  Well nourished, well developed in no acute distress HEENT: Normal NECK: No JVD; No carotid bruits LYMPHATICS: No lymphadenopathy CARDIAC: RRR, no murmurs, rubs, gallops RESPIRATORY:  Clear to auscultation without rales, wheezing or rhonchi  ABDOMEN: Soft, non-tender, non-distended MUSCULOSKELETAL:  No edema; No deformity  SKIN: Warm and dry NEUROLOGIC:  Alert and oriented x 3 PSYCHIATRIC:  Normal affect   ASSESSMENT AND PLAN:    NICM - Pending cMRI - Coronary CTA with mild CAD - Pending cMRI -Increase Toprol-XL to 50 mg daily as recommended -Continue Entresto 24/26 mg twice daily -Add Jardiance -Consider adding spironolactone if blood pressure support during next office visit  2.  Syncope, LBBB, prolonged QT interval - - syncope was felt to be vasovagal in the setting of emotional stress. However, she has also  been noted to have both electrical and structural disturbances in her cardiac workup with LBBB, prolonged QT interval (which may partly be affected by the wide QRS of 158m), and LV dysfunction.Discussed her case with Dr. MMyles Gipwho felt that given her LBBB and cardiomyopathy, she may benefit from formal EPS to evaluate for inducible arrhythmia. Pending formal evaluation.   3. PSVT -Increase beta-blocker as above -Mild intermittent skipped beat feeling  4.  Aortic atherosclerosis/non obstructive CAD -Restart Zetia.  Continue Lipitor -See below  5.  Lower extremity muscle stiffness -No improvement on her stiffness despite discontinuation of Zetia.  She will restart.  Suspect her symptoms likely due to chronic back issue.  She may try stop taking Lipitor for week.  6. HTN - BP stable    Medication Adjustments/Labs and Tests Ordered: Current medicines are reviewed at length with the patient today.  Concerns regarding medicines are outlined above.  No orders of the defined types were placed in this encounter.  No orders of the defined types were placed in this encounter.   Patient Instructions  Medication Instructions:  RESTART Zetia 172mTake 1 tablet daily   INCREASE 5028make 1 tablet once a day START Jardiance 61m16mke 1 tablet once a day  *If you need a refill on your cardiac medications before your next appointment, please call your pharmacy*   Lab Work: TODAY-BMET If you have labs (blood work) drawn today and your tests are completely normal, you will receive your results only by: MyChButts you have MyChart) OR A paper copy in the mail If you have any lab test that is abnormal or we need to change your treatment, we will call you to review the results.   Testing/Procedures: NONE   Follow-Up: [facility]  your health needs are our priority.  As part of our continuing mission to provide you with exceptional heart care, we have created designated Provider Care Teams.  These Care Teams include your primary Cardiologist (physician) and Advanced Practice Providers (APPs -  Physician Assistants and Nurse Practitioners) who all work together to provide you with the care you need, when you need it.  We recommend signing up for the patient portal called "MyChart".  Sign up information is provided on this After Visit Summary.  MyChart is used to connect with patients for Virtual Visits (Telemedicine).  Patients are able to view lab/test results, encounter notes, upcoming appointments, etc.  Non-urgent messages can be sent to your provider as well.   To learn more about what you can do with MyChart, go to [email or web address] Your next appointment:   1-2 month(s)  The format for your next appointment:   In Person  Provider:   [name]   Other Instructions   Important Information About Sugar         SignJarrett Soho  09/06/2022 4:00 PM    [facility]

## 2022-09-07 ENCOUNTER — Encounter: Payer: 59 | Attending: Physical Medicine and Rehabilitation | Admitting: Physical Medicine and Rehabilitation

## 2022-09-07 ENCOUNTER — Encounter: Payer: Self-pay | Admitting: Physical Medicine and Rehabilitation

## 2022-09-07 VITALS — BP 114/79 | HR 79 | Ht 64.0 in | Wt 241.2 lb

## 2022-09-07 DIAGNOSIS — M5416 Radiculopathy, lumbar region: Secondary | ICD-10-CM | POA: Diagnosis present

## 2022-09-07 DIAGNOSIS — R29898 Other symptoms and signs involving the musculoskeletal system: Secondary | ICD-10-CM | POA: Diagnosis present

## 2022-09-07 DIAGNOSIS — G894 Chronic pain syndrome: Secondary | ICD-10-CM | POA: Diagnosis present

## 2022-09-07 LAB — BASIC METABOLIC PANEL
BUN/Creatinine Ratio: 9 (ref 9–23)
BUN: 8 mg/dL (ref 6–24)
CO2: 21 mmol/L (ref 20–29)
Calcium: 9.2 mg/dL (ref 8.7–10.2)
Chloride: 100 mmol/L (ref 96–106)
Creatinine, Ser: 0.93 mg/dL (ref 0.57–1.00)
Glucose: 90 mg/dL (ref 70–99)
Potassium: 4.1 mmol/L (ref 3.5–5.2)
Sodium: 138 mmol/L (ref 134–144)
eGFR: 72 mL/min/{1.73_m2} (ref >59)

## 2022-09-07 MED ORDER — HYDROCODONE-ACETAMINOPHEN 5-325 MG PO TABS: 1.0000 | ORAL_TABLET | Freq: Four times a day (QID) | ORAL | 0 refills | Status: DC | PRN

## 2022-09-07 NOTE — Patient Instructions (Signed)
Pt is a 56 yr old female with hx of DM,- A1c 6.1;  Anxiety, R breast invasive ductal cancer of R breast in 2012 neuropathy in hands and feet; and arthritis and pain in legs.  Also hx of lumbar surgery- been on Norco for "years".  Grief reaction and depression over death of sister.     Will wait on MRI lumbar spine-  Explained even if we find anything, cannot do anything about it until Ok'd by Cards.   2. Con't Duloxetine 30 mg- daily- don't increase since also on Celexa.    3. On Jardiance now for cardiac and DM- also on Trulicity for DM.    4. Just increased metoprolol to 50 mg daily for BP/HTN  5. Just got refill of Norco- 5/325 mg 4x/day as needed- #120- not due ot refill until 11/11- will refill but cannot get picked up til 10/11 -sent to [facility]- last time they didn't have, fyi.   6. I suggest getting getting a cane- needs to get something with stability that doesn't lean over to use- also educated how to use cane- suggest since L is worse/weaker, keep cane on R and bring forward with L foot.    7. F/U in 3 months- needs UDS at next visit.

## 2022-09-07 NOTE — Progress Notes (Signed)
Subjective:    Patient ID: [name], female    DOB: 18-Nov-1966, 56 y.o.   MRN: [id number]  HPI  Pt is a 56 yr old female with hx of DM,- A1c 6.0;  Anxiety, R breast invasive ductal cancer of R breast in 2012 neuropathy in hands and feet; and arthritis and pain in legs.  Also hx of lumbar surgery- been on Norco for "years".  Grief reaction and depression over death of sister.     Went up to 4x/day on Norco at last vist- said has helped  A little bit".    Talked to doctor about LUE swelling- sounds like it's a lipoma per pt description.  Trying to deal with heart issues first and then will have it done.    Got ECHO and cardiac CT- MRI scheduled for January. 11/14- back to Cardiology- to "check signals in heart" through wire in groin- is planned.   Heart muscle is weak- not pumping as it should.  Last UDS looks good- appropriate in 6/23.   Feeling like back getting worse and legs getting weak.  Explained even if we find anything, cannot do anything about it until Ok'd by Cards.   Holds onto anything around her- when walking around- feels wobbly.  And weak on her legs- But doesn't want to use cane/assistive device.   Feels like LLE weaker than R side.      Pain Inventory Average Pain 8 Pain Right Now 6 My pain is sharp, stabbing, tingling, and aching  In the last 24 hours, has pain interfered with the following? General activity 7 Relation with others 8 Enjoyment of life 9 What TIME of day is your pain at its worst? morning , daytime, evening, and night Sleep (in general) Poor  Pain is worse with: walking, bending, standing, and some activites Pain improves with: medication Relief from Meds: 5  Family History  Problem Relation Age of Onset   Colon cancer Mother        mid-50s, died of metastatic disease   Cancer Mother        liver   Coronary artery disease Mother    Diabetes type I Mother    Peripheral vascular disease Mother        Carotid disease  in her 96s   Coronary artery disease Father        CAD in his 57s   Diabetes Father    Diabetes type I Father    Hypertension Father    Bipolar disorder Sister    Coronary artery disease Brother        MI in his 88s   Hypertension Brother    Hypertension Maternal Grandmother    Hyperlipidemia Maternal Grandmother    Stroke Maternal Grandmother    Hypertension Maternal Grandfather    Diabetes Maternal Grandfather    Diabetes Paternal Grandmother    Heart disease Paternal Grandfather    Social History   Socioeconomic History   Marital status: Married    Spouse name: Not on file   Number of children: 3   Years of education: Not on file   Highest education level: Not on file  Occupational History   Occupation: child care    Employer: [employer]  Tobacco Use   Smoking status: Never   Smokeless tobacco: Never  Vaping Use   Vaping Use: Never used  Substance and Sexual Activity   Alcohol use: No    Alcohol/week: 0.0 standard drinks of alcohol  Drug use: No   Sexual activity: Yes    Partners: Male    Birth control/protection: None  Other Topics Concern   Not on file  Social History Narrative   Not on file   Social Determinants of Health   Financial Resource Strain: Not on file  Food Insecurity: Not on file  Transportation Needs: Not on file  Physical Activity: Not on file  Stress: Not on file  Social Connections: Not on file   Past Surgical History:  Procedure Laterality Date   back surg x2     BALLOON DILATION N/A 09/22/2021   Procedure: BALLOON DILATION;  Surgeon: Eloise Harman, DO;  [facility];  Service: Endoscopy;  Laterality: N/A;   BIOPSY  09/22/2021   Procedure: BIOPSY;  Surgeon: Eloise Harman, DO;  [facility];  Service: Endoscopy;;   BREAST LUMPECTOMY Right 2012   CHOLECYSTECTOMY     COLONOSCOPY  11/03/07   4-mm sessile polyp removed/small internal hemorrhoids/tubular adenoma, random colon bx negative for  microscopic colitis   COLONOSCOPY WITH PROPOFOL N/A 09/17/2016   Grade 2 hemorrhoids, colonic diverticulosis, ascending colon polyp (sessile serrated adenoma), 5 year surveillance   COLONOSCOPY WITH PROPOFOL N/A 09/22/2021   Procedure: COLONOSCOPY WITH PROPOFOL;  Surgeon: Eloise Harman, DO;  [facility];  Service: Endoscopy;  Laterality: N/A;  7:30am   COLONOSCOPY, ESOPHAGOGASTRODUODENOSCOPY (EGD) AND ESOPHAGEAL DILATION  01/2012   mild gastritis s/p biopsy. Empiric dilation. Normal duodenum.    DILATATION & CURETTAGE/HYSTEROSCOPY WITH MYOSURE N/A 02/10/2015   Procedure: DILATATION & CURETTAGE/HYSTEROSCOPY WITH MYOSURE;  Surgeon: Nunzio Cobbs, MD;  Location: [facility];  Service: Gynecology;  Laterality: N/A;   ESOPHAGOGASTRODUODENOSCOPY (EGD) WITH PROPOFOL N/A 01/11/2020   Normal esophagus, s/p dilation, normal stomach, normal duodenum.    ESOPHAGOGASTRODUODENOSCOPY (EGD) WITH PROPOFOL N/A 09/22/2021   Procedure: ESOPHAGOGASTRODUODENOSCOPY (EGD) WITH PROPOFOL;  Surgeon: Eloise Harman, DO;  [facility];  Service: Endoscopy;  Laterality: N/A;   MALONEY DILATION N/A 01/11/2020   Procedure: Venia Minks DILATION;  Surgeon: Daneil Dolin, MD;  [facility];  Service: Endoscopy;  Laterality: N/A;   MM BREAST STEREO BX*L*R/S     rt.   POLYPECTOMY  09/17/2016   Procedure: POLYPECTOMY;  Surgeon: Daneil Dolin, MD;  [facility];  Service: Endoscopy;;  ascending colon   POLYPECTOMY  09/22/2021   Procedure: POLYPECTOMY;  Surgeon: Eloise Harman, DO;  [facility];  Service: Endoscopy;;   PORT-A-CATH REMOVAL  05/26/2012   Procedure: REMOVAL PORT-A-CATH;  Surgeon: Jamesetta So, MD;  Location: [facility];  Service: General;  Laterality: N/A;  Minor Room   PORTACATH PLACEMENT     Past Surgical History:  Procedure Laterality Date   back surg x2     BALLOON DILATION N/A 09/22/2021   Procedure: BALLOON DILATION;  Surgeon: Eloise Harman,  DO;  [facility];  Service: Endoscopy;  Laterality: N/A;   BIOPSY  09/22/2021   Procedure: BIOPSY;  Surgeon: Eloise Harman, DO;  [facility];  Service: Endoscopy;;   BREAST LUMPECTOMY Right 2012   CHOLECYSTECTOMY     COLONOSCOPY  11/03/07   4-mm sessile polyp removed/small internal hemorrhoids/tubular adenoma, random colon bx negative for microscopic colitis   COLONOSCOPY WITH PROPOFOL N/A 09/17/2016   Grade 2 hemorrhoids, colonic diverticulosis, ascending colon polyp (sessile serrated adenoma), 5 year surveillance   COLONOSCOPY WITH PROPOFOL N/A 09/22/2021   Procedure: COLONOSCOPY  WITH PROPOFOL;  Surgeon: Eloise Harman, DO;  [facility];  Service: Endoscopy;  Laterality: N/A;  7:30am   COLONOSCOPY, ESOPHAGOGASTRODUODENOSCOPY (EGD) AND ESOPHAGEAL DILATION  01/2012   mild gastritis s/p biopsy. Empiric dilation. Normal duodenum.    DILATATION & CURETTAGE/HYSTEROSCOPY WITH MYOSURE N/A 02/10/2015   Procedure: DILATATION & CURETTAGE/HYSTEROSCOPY WITH MYOSURE;  Surgeon: Nunzio Cobbs, MD;  Location: [facility];  Service: Gynecology;  Laterality: N/A;   ESOPHAGOGASTRODUODENOSCOPY (EGD) WITH PROPOFOL N/A 01/11/2020   Normal esophagus, s/p dilation, normal stomach, normal duodenum.    ESOPHAGOGASTRODUODENOSCOPY (EGD) WITH PROPOFOL N/A 09/22/2021   Procedure: ESOPHAGOGASTRODUODENOSCOPY (EGD) WITH PROPOFOL;  Surgeon: Eloise Harman, DO;  [facility];  Service: Endoscopy;  Laterality: N/A;   MALONEY DILATION N/A 01/11/2020   Procedure: Venia Minks DILATION;  Surgeon: Daneil Dolin, MD;  [facility];  Service: Endoscopy;  Laterality: N/A;   MM BREAST STEREO BX*L*R/S     rt.   POLYPECTOMY  09/17/2016   Procedure: POLYPECTOMY;  Surgeon: Daneil Dolin, MD;  [facility];  Service: Endoscopy;;  ascending colon   POLYPECTOMY  09/22/2021   Procedure: POLYPECTOMY;  Surgeon: Eloise Harman, DO;  [facility];   Service: Endoscopy;;   PORT-A-CATH REMOVAL  05/26/2012   Procedure: REMOVAL PORT-A-CATH;  Surgeon: Jamesetta So, MD;  Location: [facility];  Service: General;  Laterality: N/A;  Minor Room   PORTACATH PLACEMENT     Past Medical History:  Diagnosis Date   Anxiety    Arthritis    Blood transfusion without reported diagnosis 1999   due to heavy menses   Breast cancer ([place]) 05/22/2011   03/01/11, Stage 2, s/p lumpectomy, chemo/xrt   Complication of anesthesia    pt woke up during procedure   DDD (degenerative disc disease), lumbar    Depression 06/22/2011   Diverticulitis    DM (diabetes mellitus) ([place]) 06/22/2011   Genital warts    GERD (gastroesophageal reflux disease) 06/22/2011   Hx of adenomatous colonic polyps 10/2007   42m sigmoid tubular adenoma, FH colon cancer, mother in mid-50s   Hypercholesterolemia 06/22/2011   Hypertension 06/22/2011   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[facility] 05/22/2011   Iron deficiency anemia 03/25/2015   LBBB (left bundle branch block)    Mild CAD    Morbid obesity ([facility])    NICM (nonischemic cardiomyopathy) ([facility]    PSVT (paroxysmal supraventricular tachycardia)    Shortness of breath    STD (sexually transmitted disease)    HPV, 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   Syncope    Syncope    Vaginal bleeding 03/08/2014   BP 114/79   Pulse 79   Ht '5\' 4"'$  (1.626 m)   Wt 241 lb 3.2 oz (109.4 kg)   LMP 04/05/2016 Comment: spotting 11/2016 and 12/2016  SpO2 94%   BMI 41.40 kg/m   Opioid Risk Score:   Fall Risk Score:  `1  Depression screen PHQ 2/9     10/20/2021    3:10 PM 07/14/2021    3:19 PM 05/05/2021    2:37 PM 04/14/2021    1:11 PM 10/04/2020    2:52 PM 06/08/2017   11:26 PM  Depression screen PHQ 2/9  Decreased Interest '3 1 3 2 1 1  '$ Down, Depressed, Hopeless '3 1 3 2 1 1  '$ PHQ - 2 Score '6 2 6 4 2 2  '$ Altered sleeping   '1 2 1 1  '$ Tired, decreased  energy   '3 2 2 1  '$ Change in appetite   1 1 0 1  Feeling bad or failure about yourself    '1 1  1 '$ 0  Trouble concentrating   '2 3 1 1  '$ Moving slowly or fidgety/restless   '1 3 2 '$ 0  Suicidal thoughts   1 0 0   PHQ-9 Score   '16 16 9 6  '$ Difficult doing work/chores    Extremely dIfficult Somewhat difficult Somewhat difficult      Review of Systems  Musculoskeletal:  Positive for back pain.       Bilateral arm pain Bilateral leg pain  All other systems reviewed and are negative.     Objective:   Physical Exam  Awake, alert, appropriate, BMI 41.4; sitting on exam table, NAD  MS: LE's RLE_ HF 5/5; KE and KF 4+/5; DF and PF 5/5 LLE- HF 4+/5; KE/KF 4+/5; and DF and PF 5-/5  Gait_ stiff kneed with walking- although looks equal, pt c/o more weakness on LLE      Assessment & Plan:   Pt is a 56 yr old female with hx of DM,- A1c 6.1;  Anxiety, R breast invasive ductal cancer of R breast in 2012 neuropathy in hands and feet; and arthritis and pain in legs.  Also hx of lumbar surgery- been on Norco for "years".  Grief reaction and depression over death of sister.     Will wait on MRI lumbar spine-  Explained even if we find anything, cannot do anything about it until Ok'd by Cards.   2. Con't Duloxetine 30 mg- daily- don't increase since also on Celexa.    3. On Jardiance now for cardiac and DM- also on Trulicity for DM.    4. Just increased metoprolol to 50 mg daily for BP/HTN  5. Just got refill of Norco- 5/325 mg 4x/day as needed- #120- not due ot refill until 11/11- will refill but cannot get picked up til 10/11 -sent to [facility]- last time they didn't have, fyi.   6. I suggest getting getting a cane- needs to get something with stability that doesn't lean over to use- also educated how to use cane- suggest since L is worse/weaker, keep cane on R and bring forward with L foot.    7. F/U in 3 months- needs UDS at next visit.     I spent a total of 21  minutes on total care today- >50% coordination of care- due to education on using cane, and continue pain meds- also  discussed MRI and waiting and why.

## 2022-09-17 ENCOUNTER — Other Ambulatory Visit: Payer: Self-pay | Admitting: Family Medicine

## 2022-09-17 DIAGNOSIS — E1165 Type 2 diabetes mellitus with hyperglycemia: Secondary | ICD-10-CM

## 2022-09-24 ENCOUNTER — Other Ambulatory Visit: Payer: Self-pay | Admitting: Family Medicine

## 2022-10-02 ENCOUNTER — Institutional Professional Consult (permissible substitution): Payer: 59 | Admitting: Cardiovascular Disease

## 2022-10-09 IMAGING — DX DG CHEST 2V
2 series · 2 of 2 positions shown · non-contrast
Comparison: 12/14/2016.

CLINICAL DATA: Cough, fever.

EXAM:
CHEST - 2 VIEW

[chest pa]
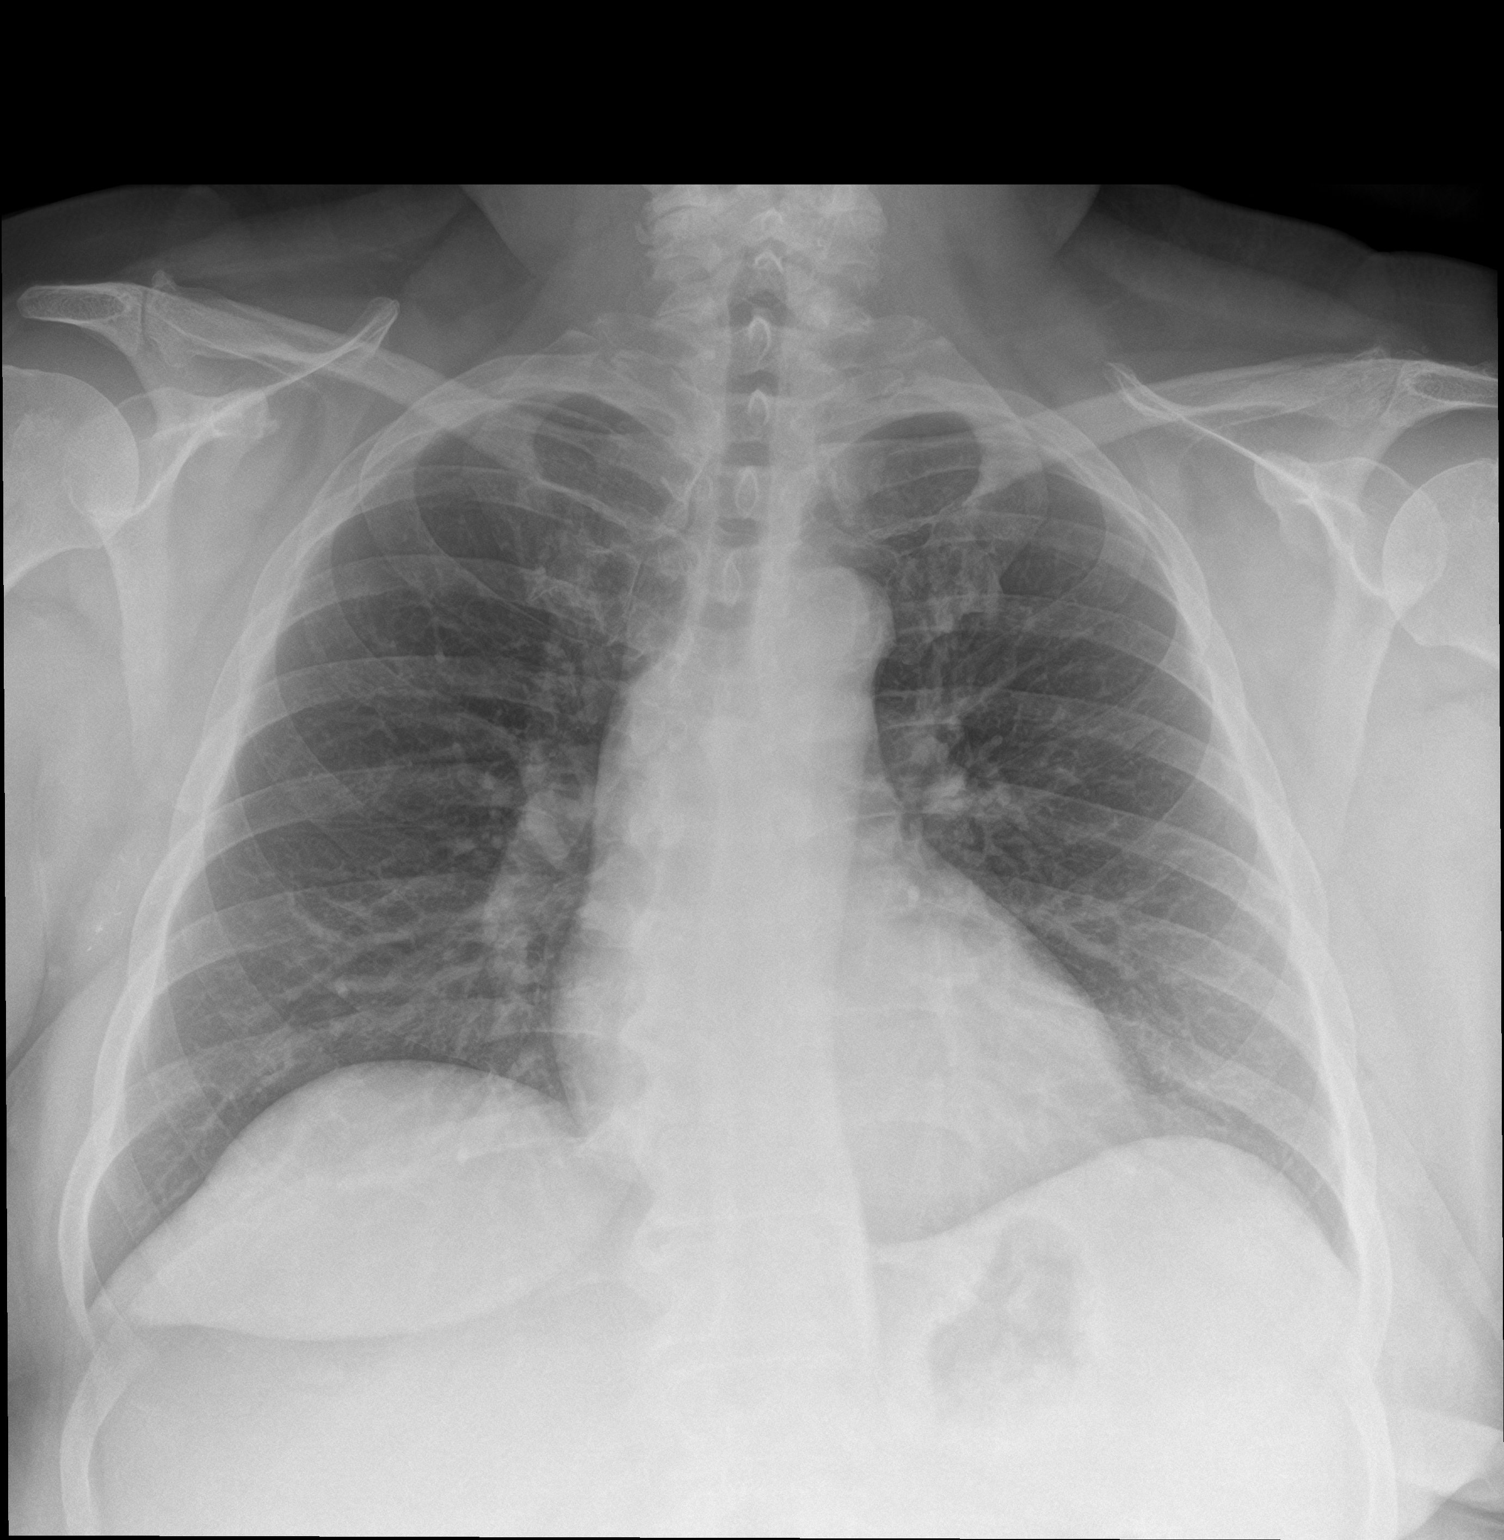

[chest lat]
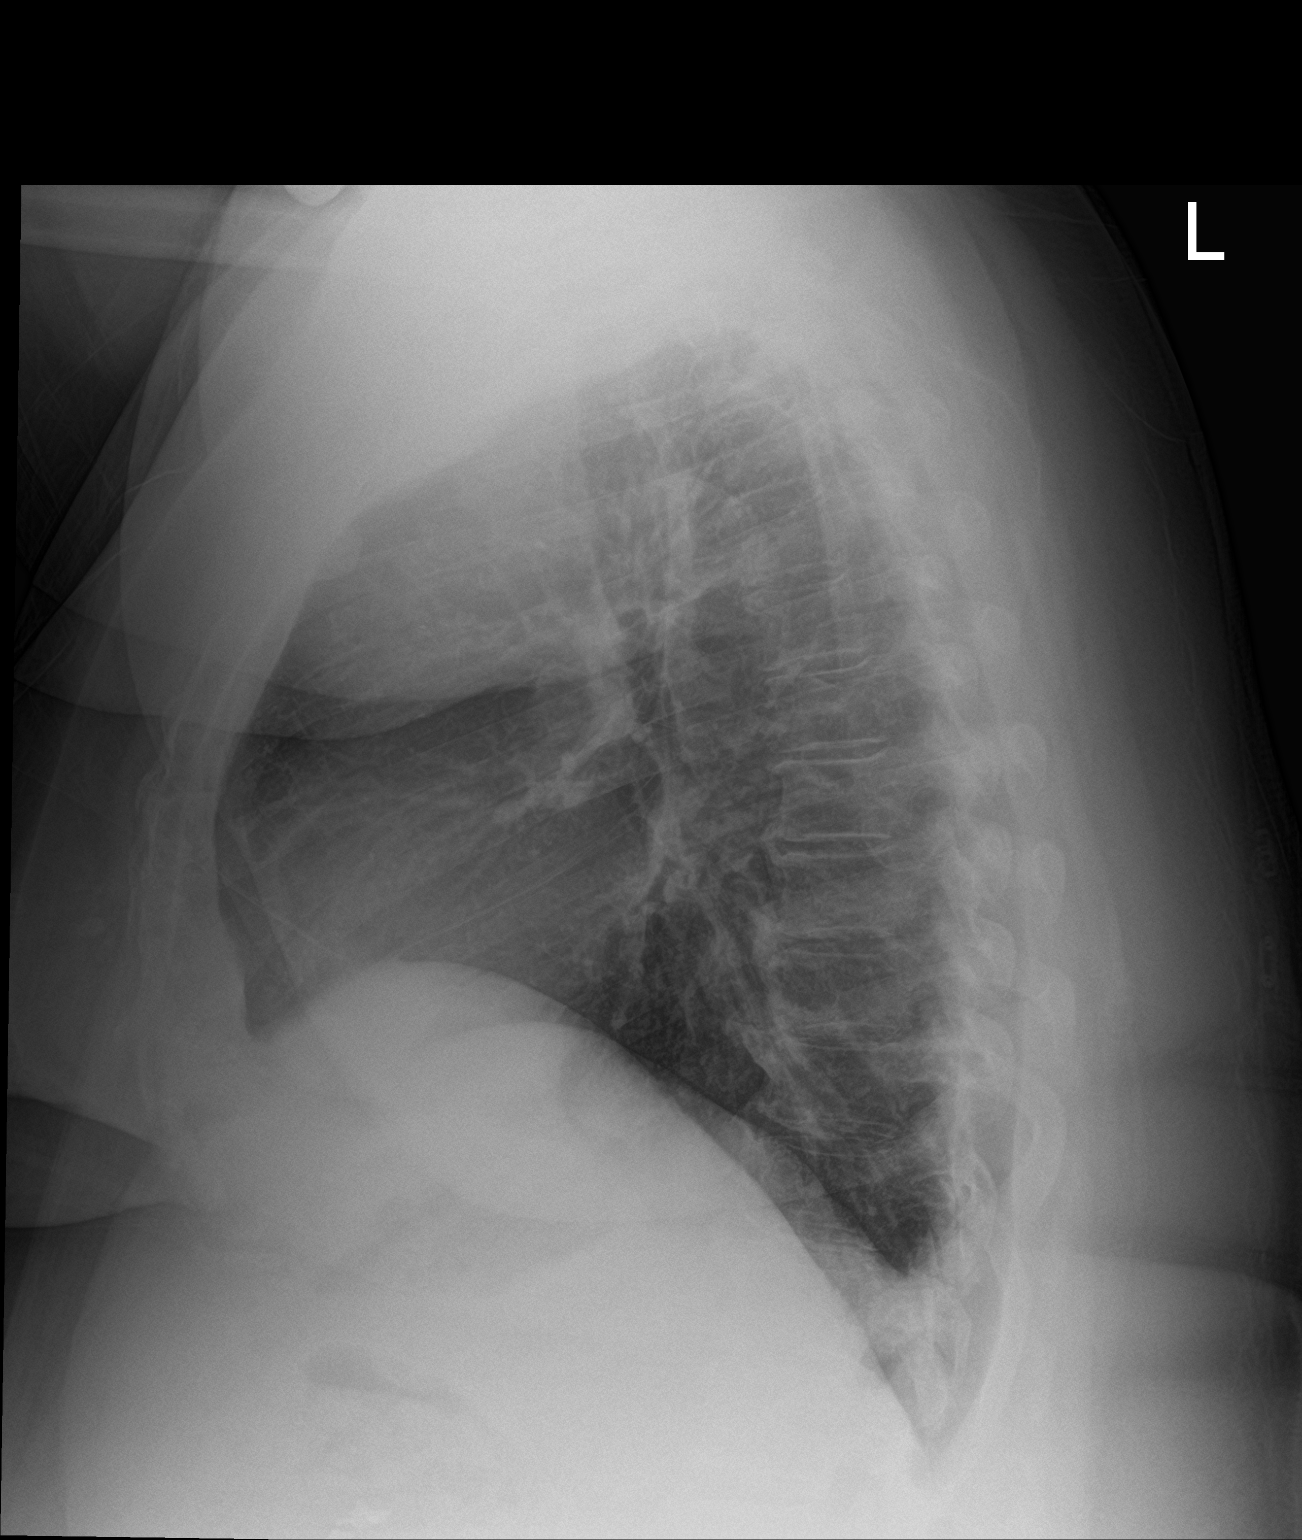

[2 of 2 positions shown; findings below may reference images not displayed]

FINDINGS: No consolidation. No visible pleural effusions or pneumothorax.
Cardiomediastinal silhouette is similar in within normal limits.
Degenerative changes of the spine. Similar appearance of the chest
in comparison to the prior.
IMPRESSION: No evidence of acute cardiopulmonary disease.

## 2022-10-14 ENCOUNTER — Other Ambulatory Visit: Payer: Self-pay | Admitting: Family Medicine

## 2022-10-14 DIAGNOSIS — J302 Other seasonal allergic rhinitis: Secondary | ICD-10-CM

## 2022-10-19 ENCOUNTER — Other Ambulatory Visit: Payer: Self-pay | Admitting: Family Medicine

## 2022-10-19 DIAGNOSIS — Z419 Encounter for procedure for purposes other than remedying health state, unspecified: Secondary | ICD-10-CM | POA: Diagnosis not present

## 2022-10-31 ENCOUNTER — Telehealth: Payer: Self-pay | Admitting: Physical Medicine and Rehabilitation

## 2022-10-31 ENCOUNTER — Other Ambulatory Visit: Payer: Self-pay | Admitting: Family Medicine

## 2022-10-31 DIAGNOSIS — E1165 Type 2 diabetes mellitus with hyperglycemia: Secondary | ICD-10-CM

## 2022-10-31 NOTE — Telephone Encounter (Signed)
Patient needs a refill on hydrocodone.

## 2022-11-01 MED ORDER — HYDROCODONE-ACETAMINOPHEN 5-325 MG PO TABS: 1.0000 | ORAL_TABLET | Freq: Four times a day (QID) | ORAL | 0 refills | Status: DC | PRN

## 2022-11-01 NOTE — Telephone Encounter (Signed)
Rx written and sent to the pharmacy. Thanks!  

## 2022-11-01 NOTE — Addendum Note (Signed)
Addended by: Alger Simons T on: 11/01/2022 10:41 AM   Modules accepted: Orders

## 2022-11-13 ENCOUNTER — Encounter: Payer: Self-pay | Admitting: Medical

## 2022-11-13 ENCOUNTER — Ambulatory Visit: Payer: 59 | Attending: Medical | Admitting: Medical

## 2022-11-13 VITALS — BP 118/84 | HR 92 | Ht 64.0 in | Wt 236.0 lb

## 2022-11-13 DIAGNOSIS — I5022 Chronic systolic (congestive) heart failure: Secondary | ICD-10-CM

## 2022-11-13 DIAGNOSIS — I471 Supraventricular tachycardia, unspecified: Secondary | ICD-10-CM

## 2022-11-13 DIAGNOSIS — I447 Left bundle-branch block, unspecified: Secondary | ICD-10-CM | POA: Diagnosis not present

## 2022-11-13 DIAGNOSIS — I428 Other cardiomyopathies: Secondary | ICD-10-CM

## 2022-11-13 DIAGNOSIS — R55 Syncope and collapse: Secondary | ICD-10-CM

## 2022-11-13 DIAGNOSIS — E782 Mixed hyperlipidemia: Secondary | ICD-10-CM

## 2022-11-13 NOTE — Progress Notes (Signed)
Cardiology Office Note:    Date:  11/13/2022   ID:  [name], DOB January 03, 1966, MRN [id number]  PCP:  Coral Spikes, DO  [facility] Cardiologist:  Candee Furbish, MD  [facility] Electrophysiologist:  Melida Quitter, MD   Referring MD: Coral Spikes, DO   Chief Complaint: 1-2 month follow-up  History of Present Illness:    [name] is a 56 y.o. female with a hx of DM, breast CA s/p lumpectomy/chemo/xrt, anxiety, depression, DDD, diverticulitis, GERD, HTN, HLD, IDA, obesity, LBBB since 2022, NICM, mild CAD, aortic atherosclerosis, pSVT who is being seen for follow-up.  She was evaluated in 2018 for chest pain with emotional stress. Nuclear stress test was felt to be reassuring with abnormality felt d/t breast attenuation rather than ischemia.    She re-established care with Dr. Mardene Sayer in 06/2022 for evaluation of syncope. In June of 2023 her sister was actively dying from breast cancer. She was on the phone with her family who helped her say goodbye to her sister. 5 minutes later her sister passed away. When the patient got the news, she blacked out for just a moment. This was witnessed by her husband. There was no seizure, b/b incontinence, or post-ictal state. She then remembered hr sister had died and began hyperventilating. Within a few moments she felt normal. She didn't seek immediate care but in close follow-up SBP was 98 and lisinopril was decreased. The suspicion was vasocagal and Dr. Marlou Porch agreed this was likely the case in his visit 06/2022. He did note she'd had a LBBB on EKG that had been identified in 08/2021.Subsequent echo 06/2022 showed poor acoustic windoww but with an EF 30-35%, severe HK/akinesis of the septal wall; HK of the anterior and inferolateral wall, mild LVE, mild concentric LVH, mid MR. Coronary CT showed CAC 141, 96% with mild nonobstructive disease. Zetia was added. Hart monitor showed NSR with 9 runs of SVT, rare PVCs/PACs. She was started on  Enstresto with plan for f/u. A cMRI Was ordered to further evaluate reasons for CM.   Last seen 08/2022 and cMRI was not ready. Jardiance was added. Given syncope, LBBB and prolonged Qtc, she was referred to EP for evaluation for inducible arrhythmia.    Today, the patient reports dizziness and lightheadedness while standing.  Blood pressure today is good at 118/84.  Patient denies chest pain.  She reports chronic and unchanged shortness of breath.  Patient is scheduled for her cardiac MRI January 4.  She also has an upcoming visit with her primary care, he ordered repeat lipid panels.  Past Medical History:  Diagnosis Date   Anxiety    Arthritis    Blood transfusion without reported diagnosis 1999   due to heavy menses   Breast cancer ([place]) 05/22/2011   03/01/11, Stage 2, s/p lumpectomy, chemo/xrt   Complication of anesthesia    pt woke up during procedure   DDD (degenerative disc disease), lumbar    Depression 06/22/2011   Diverticulitis    DM (diabetes mellitus) ([place]) 06/22/2011   Genital warts    GERD (gastroesophageal reflux disease) 06/22/2011   Hx of adenomatous colonic polyps 10/2007   61m sigmoid tubular adenoma, FH colon cancer, mother in mid-50s   Hypercholesterolemia 06/22/2011   Hypertension 06/22/2011   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[place] 05/22/2011   Iron deficiency anemia 03/25/2015   LBBB (left bundle branch block)    Mild CAD    Morbid obesity ([place]  NICM (nonischemic cardiomyopathy) ([place])    PSVT (paroxysmal supraventricular tachycardia)    Shortness of breath    STD (sexually transmitted disease)    HPV, 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   Syncope    Syncope    Vaginal bleeding 03/08/2014    Past Surgical History:  Procedure Laterality Date   back surg x2     BALLOON DILATION N/A 09/22/2021   Procedure: BALLOON DILATION;  Surgeon: Eloise Harman, DO;  [facility];  Service: Endoscopy;  Laterality: N/A;   BIOPSY  09/22/2021    Procedure: BIOPSY;  Surgeon: Eloise Harman, DO;  [facility];  Service: Endoscopy;;   BREAST LUMPECTOMY Right 2012   CHOLECYSTECTOMY     COLONOSCOPY  11/03/07   4-mm sessile polyp removed/small internal hemorrhoids/tubular adenoma, random colon bx negative for microscopic colitis   COLONOSCOPY WITH PROPOFOL N/A 09/17/2016   Grade 2 hemorrhoids, colonic diverticulosis, ascending colon polyp (sessile serrated adenoma), 5 year surveillance   COLONOSCOPY WITH PROPOFOL N/A 09/22/2021   Procedure: COLONOSCOPY WITH PROPOFOL;  Surgeon: Eloise Harman, DO;  [facility];  Service: Endoscopy;  Laterality: N/A;  7:30am   COLONOSCOPY, ESOPHAGOGASTRODUODENOSCOPY (EGD) AND ESOPHAGEAL DILATION  01/2012   mild gastritis s/p biopsy. Empiric dilation. Normal duodenum.    DILATATION & CURETTAGE/HYSTEROSCOPY WITH MYOSURE N/A 02/10/2015   Procedure: DILATATION & CURETTAGE/HYSTEROSCOPY WITH MYOSURE;  Surgeon: Nunzio Cobbs, MD;  Location: [facility];  Service: Gynecology;  Laterality: N/A;   ESOPHAGOGASTRODUODENOSCOPY (EGD) WITH PROPOFOL N/A 01/11/2020   Normal esophagus, s/p dilation, normal stomach, normal duodenum.    ESOPHAGOGASTRODUODENOSCOPY (EGD) WITH PROPOFOL N/A 09/22/2021   Procedure: ESOPHAGOGASTRODUODENOSCOPY (EGD) WITH PROPOFOL;  Surgeon: Eloise Harman, DO;  [facility];  Service: Endoscopy;  Laterality: N/A;   MALONEY DILATION N/A 01/11/2020   Procedure: Venia Minks DILATION;  Surgeon: Daneil Dolin, MD;  [facility];  Service: Endoscopy;  Laterality: N/A;   MM BREAST STEREO BX*L*R/S     rt.   POLYPECTOMY  09/17/2016   Procedure: POLYPECTOMY;  Surgeon: Daneil Dolin, MD;  [facility];  Service: Endoscopy;;  ascending colon   POLYPECTOMY  09/22/2021   Procedure: POLYPECTOMY;  Surgeon: Eloise Harman, DO;  [facility];  Service: Endoscopy;;   PORT-A-CATH REMOVAL  05/26/2012   Procedure: REMOVAL PORT-A-CATH;  Surgeon:  Jamesetta So, MD;  Location: [facility];  Service: General;  Laterality: N/A;  Minor Room   PORTACATH PLACEMENT      Current Medications: Current Meds  Medication Sig   atorvastatin (LIPITOR) 40 MG tablet TAKE 1 TABLET BY MOUTH EVERYDAY AT BEDTIME   blood glucose meter kit and supplies Dispense based on patient and insurance preference. Use up to four times daily as directed. (FOR ICD-10 E10.9, E11.9).   cetirizine (ZYRTEC) 10 MG tablet TAKE 1 TABLET BY MOUTH EVERY DAY   citalopram (CELEXA) 40 MG tablet TAKE 1 TABLET BY MOUTH EVERY DAY   Dulaglutide (TRULICITY) 9.93 TT/0.1XB SOPN INJECT 0.75 MG SUBCUTANEOUSLY ONCE A WEEK   DULoxetine (CYMBALTA) 30 MG capsule Take 1 capsule (30 mg total) by mouth daily.   empagliflozin (JARDIANCE) 10 MG TABS tablet Take 1 tablet (10 mg total) by mouth daily before breakfast.   ezetimibe (ZETIA) 10 MG tablet Take 1 tablet (10 mg total) by mouth daily.   fluticasone (FLONASE) 50 MCG/ACT nasal spray USE 2 SPRAY(S) IN EACH NOSTRIL ONCE DAILY AS NEEDED   HYDROcodone-acetaminophen (  NORCO/VICODIN) 5-325 MG tablet Take 1 tablet by mouth every 6 (six) hours as needed for severe pain.   metFORMIN (GLUCOPHAGE-XR) 750 MG 24 hr tablet TAKE 1 TABLET BY MOUTH EVERY DAY WITH BREAKFAST   metoprolol succinate (TOPROL XL) 50 MG 24 hr tablet Take 1 tablet (50 mg total) by mouth daily.   Multiple Vitamin (MULTIVITAMIN) capsule Take 1 capsule by mouth daily.   Omega-3 Fatty Acids (FISH OIL) 1000 MG CAPS Take 1,000 mg by mouth daily.   ondansetron (ZOFRAN-ODT) 8 MG disintegrating tablet DISSOLVE 1 TABLET IN MOUTH EVERY 8 HOURS AS NEEDED FOR NAUSEA OR VOMITING.   pantoprazole (PROTONIX) 40 MG tablet Take 1 tablet (40 mg total) by mouth 2 (two) times daily before a meal.   sacubitril-valsartan (ENTRESTO) 24-26 MG Take 1 tablet by mouth 2 (two) times daily.   Vitamin D, Ergocalciferol, (DRISDOL) 1.25 MG (50000 UNIT) CAPS capsule Take 1 capsule (50,000 Units total) by mouth once a week.      Allergies:   Flagyl [metronidazole], Lansoprazole, and Pravastatin   Social History   Socioeconomic History   Marital status: Married    Spouse name: Not on file   Number of children: 3   Years of education: Not on file   Highest education level: Not on file  Occupational History   Occupation: child care    Employer: [employer]  Tobacco Use   Smoking status: Never   Smokeless tobacco: Never  Vaping Use   Vaping Use: Never used  Substance and Sexual Activity   Alcohol use: No    Alcohol/week: 0.0 standard drinks of alcohol   Drug use: No   Sexual activity: Yes    Partners: Male    Birth control/protection: None  Other Topics Concern   Not on file  Social History Narrative   Not on file   Social Determinants of Health   Financial Resource Strain: Not on file  Food Insecurity: Not on file  Transportation Needs: Not on file  Physical Activity: Not on file  Stress: Not on file  Social Connections: Not on file     Family History: The patient's family history includes Bipolar disorder in her sister; Cancer in her mother; Colon cancer in her mother; Coronary artery disease in her brother, father, and mother; Diabetes in her father, maternal grandfather, and paternal grandmother; Diabetes type I in her father and mother; Heart disease in her paternal grandfather; Hyperlipidemia in her maternal grandmother; Hypertension in her brother, father, maternal grandfather, and maternal grandmother; Peripheral vascular disease in her mother; Stroke in her maternal grandmother.  ROS:   Please see the history of present illness.     All other systems reviewed and are negative.  EKGs/Labs/Other Studies Reviewed:    The following studies were reviewed today:  Monitor 07/2022 Normal sinus rhythm avg HR 98 bpm   Atrial tachycardia - benign   Rare PAC/PVC   No atrial fibrillation   Symptoms triggered were associiated with sinus rhythm/mild tachycardia - benign      Patch Wear Time:  13 days and 21 hours (2023-08-28T20:33:30-0400 to 2023-09-11T17:37:39-0400)   Patient had a min HR of 56 bpm, max HR of 182 bpm, and avg HR of 98 bpm. Predominant underlying rhythm was Sinus Rhythm. Bundle Branch Block/IVCD was present. 9 Supraventricular Tachycardia runs occurred, the run with the fastest interval lasting 18  beats with a max rate of 182 bpm, the longest lasting 45.0 secs with an avg rate of 108 bpm. Isolated SVEs  were rare (<1.0%), SVE Couplets were rare (<1.0%), and SVE Triplets were rare (<1.0%). Isolated VEs were rare (<1.0%, 889), VE Couplets were rare  (<1.0%, 4), and VE Triplets were rare (<1.0%, 1).     Coronary CT 07/2022 IMPRESSION: 1. Coronary calcium score of 141. This was 2 percentile for age-, sex, and race-matched controls.   2. Normal coronary origin with right dominance.   3. Mild CAD as outlined above.   4. Aortic atherosclerosis   RECOMMENDATIONS: CAD-RADS 2: Mild non-obstructive CAD (25-49%). Consider non-atherosclerotic causes of chest pain. Consider preventive therapy and risk factor modification.     Echo 06/2022 1. Poor acoustic windows limit study. Definity used. LVEF is depressed  with severe hypokinesis/akinesis of the septal wall; hypokinesis of the  anterior and inferolateral wall. Left ventricular ejection fraction, by  estimation, is 30 to 35%. The left  ventricle has moderately decreased function. The left ventricular internal  cavity size was mildly dilated. There is mild concentric left ventricular  hypertrophy. Indeterminate diastolic filling due to E-A fusion.   2. Right ventricular systolic function is normal. The right ventricular  size is normal. There is normal pulmonary artery systolic pressure.   3. The mitral valve is normal in structure. Mild mitral valve  regurgitation.   4. The aortic valve is tricuspid. Aortic valve regurgitation is not  visualized.   5. The inferior vena cava is normal in size  with greater than 50%  respiratory variability, suggesting right atrial pressure of 3 mmHg.   EKG:  EKG is ordered today.  The ekg ordered today demonstrates NSR 72bpm, TWI aVL  Recent Labs: 08/23/2022: ALT 16; Hemoglobin 13.2; Magnesium 2.0; Platelets 230; TSH 2.330 09/06/2022: BUN 8; Creatinine, Ser 0.93; Potassium 4.1; Sodium 138  Recent Lipid Panel    Component Value Date/Time   CHOL 158 10/19/2021 0820   TRIG 147 10/19/2021 0820   HDL 44 10/19/2021 0820   CHOLHDL 3.6 10/19/2021 0820   CHOLHDL 4.4 10/20/2018 0749   VLDL 36 06/04/2014 0919   LDLCALC 88 10/19/2021 0820   LDLCALC 98 10/20/2018 0749    Physical Exam:    VS:  BP 118/84   Pulse 92   Ht _0  (1.626 m)   Wt 236 lb (107 kg)   LMP 04/05/2016 Comment: spotting 11/2016 and 12/2016  SpO2 97%   BMI 40.51 kg/m     Wt Readings from Last 3 Encounters:  11/13/22 236 lb (107 kg)  09/07/22 241 lb 3.2 oz (109.4 kg)  09/06/22 239 lb 12.8 oz (108.8 kg)     GEN:  Well nourished, well developed in no acute distress HEENT: Normal NECK: No JVD; No carotid bruits LYMPHATICS: No lymphadenopathy CARDIAC: RRR, no murmurs, rubs, gallops RESPIRATORY:  Clear to auscultation without rales, wheezing or rhonchi  ABDOMEN: Soft, non-tender, non-distended MUSCULOSKELETAL:  No edema; No deformity  SKIN: Warm and dry NEUROLOGIC:  Alert and oriented x 3 PSYCHIATRIC:  Normal affect   ASSESSMENT:    1. NICM (nonischemic cardiomyopathy) ([place])   2. Chronic systolic heart failure ([place])   3. Syncope and collapse   4. LBBB (left bundle branch block)   5. PSVT (paroxysmal supraventricular tachycardia)   6. Hyperlipidemia, mixed    PLAN:    In order of problems listed above:  NICM Chronic systolic heart failure - LVEF 30-35% - pending cMRI scheduled 11/22/22 - continue Toprol 81m daily - continue Entresto 24/279mBID - continue Jardiance 1065maily - will hold on spiro given complaints of  dizziness and lightheadedness.  Orthostatics negative today.    Syncope, LBBB, prolonged Qt interval - syncope was felt to be vasovagal in the setting of emotional stress, however she has both electrical and structural disturbances in her cardiac work-up with LBBB and prolonged Qt interval.  She has an upcoming appointment with EP to evaluate for inducible arrhythmias.   pSVT - no palpitations noted - continue Toprol 31m daily  HLD - continue Zetia - she stopped Lipitor due to muscle stiffness and was restarted on Lipitor 870mdaily - LDL 88 in 2022 - PCP has ordered repeat lipid panel  Disposition: Follow up in 3 month(s) with MD/APP   Signed, [name] Mantia H Ninfa MeekerPA-C  11/13/2022 2:56 PM    [place]

## 2022-11-13 NOTE — Patient Instructions (Signed)
Medication Instructions:  ?Your physician recommends that you continue on your current medications as directed. Please refer to the Current Medication list given to you today. ? ? ?Labwork: ?None today ? ?Testing/Procedures: ?None today ? ?Follow-Up: ?3 months ? ?Any Other Special Instructions Will Be Listed Below (If Applicable). ? ?If you need a refill on your cardiac medications before your next appointment, please call your pharmacy. ? ?

## 2022-11-14 ENCOUNTER — Ambulatory Visit: Payer: 59 | Admitting: Medical

## 2022-11-15 ENCOUNTER — Institutional Professional Consult (permissible substitution): Payer: 59 | Admitting: Cardiovascular Disease

## 2022-11-19 DIAGNOSIS — Z419 Encounter for procedure for purposes other than remedying health state, unspecified: Secondary | ICD-10-CM | POA: Diagnosis not present

## 2022-11-22 ENCOUNTER — Ambulatory Visit (HOSPITAL_COMMUNITY): Admission: RE | Admit: 2022-11-22 | Payer: 59 | Source: Ambulatory Visit

## 2022-11-22 ENCOUNTER — Other Ambulatory Visit (HOSPITAL_COMMUNITY): Payer: 59

## 2022-11-23 ENCOUNTER — Other Ambulatory Visit (HOSPITAL_COMMUNITY): Payer: 59

## 2022-11-29 ENCOUNTER — Ambulatory Visit: Payer: 59 | Admitting: Family Medicine

## 2022-11-30 ENCOUNTER — Other Ambulatory Visit: Payer: Self-pay | Admitting: Family Medicine

## 2022-12-03 ENCOUNTER — Telehealth: Payer: Self-pay | Admitting: Physical Medicine and Rehabilitation

## 2022-12-03 NOTE — Telephone Encounter (Signed)
Patient needs a refill on hydrocodone.  Please call patient at [phone].

## 2022-12-04 ENCOUNTER — Telehealth: Payer: Self-pay | Admitting: Cardiology

## 2022-12-04 ENCOUNTER — Ambulatory Visit: Payer: 59 | Admitting: Medical

## 2022-12-04 MED ORDER — METOPROLOL SUCCINATE ER 50 MG PO TB24: 50.0000 mg | ORAL_TABLET | Freq: Every day | ORAL | 11 refills | Status: DC

## 2022-12-04 MED ORDER — HYDROCODONE-ACETAMINOPHEN 5-325 MG PO TABS: 1.0000 | ORAL_TABLET | Freq: Four times a day (QID) | ORAL | 0 refills | Status: DC | PRN

## 2022-12-04 NOTE — Telephone Encounter (Signed)
Pt stated she is needing a PA done for all her meds with heart.  She changed ins and they are needing all new PAs  Empegliflozin  Ezetimibe  Metoprolol

## 2022-12-04 NOTE — Telephone Encounter (Signed)
PA Submitted for Hydrocodone Neena Beecham (Key: [id number])

## 2022-12-04 NOTE — Telephone Encounter (Signed)
Patient called back and states pharmacy got medication but needs prior auth.

## 2022-12-04 NOTE — Telephone Encounter (Signed)
Pt c/o medication issue:  1. Name of Medication: sacubitril-valsartan (ENTRESTO) 24-26 MG   2. How are you currently taking this medication (dosage and times per day)? 1 tablet twice a day  3. Are you having a reaction (difficulty breathing--STAT)? no  4. What is your medication issue? Patient states the medication requires a prior authorization.

## 2022-12-04 NOTE — Telephone Encounter (Signed)
**Note De-Identified Rylyn Zawistowski Obfuscation** Entresto PA started through covermymeds. Key: [id number]

## 2022-12-04 NOTE — Telephone Encounter (Signed)
Metoprolol was sent to pt's pharmacy already. Confirmation received. Please address

## 2022-12-05 NOTE — Telephone Encounter (Signed)
**Note De-Identified Gael Londo Obfuscation** Dom[place]a [place] (Key: [id number]) PA Case ID #: [id number] Outcome: Approved.  This drug has been approved. Approved quantity: 30 tablets per 30 day(s).  Authorization Expiration Date: 11/18/2038 Drug Jardiance '10MG'$  tablets ePA cloud logo Form [facility] Medicaid of [place] Prior Authorization Request Form [phone] NCPDP)  I have notified [id number] [place], [place] [address](Ph: [phone] of this approval and I request that they fill and to notify the pt when ready for pick up.

## 2022-12-05 NOTE — Telephone Encounter (Signed)
**Note De-Identified Amanda Pote Obfuscation** Harshini Trent (Key: [id number]) PA Case ID #: [id number] Outcome Approved on January 16 Approved. This drug has been approved. Approved quantity: 60 units per 30 day(s). The drug has been approved from 12/04/2022 to 12/04/2023. Drug Entresto 24-'26MG'$  tablets ePA cloud logo Form [facility] Medicaid of [place] Prior Authorization Request Form [phone] NCPDP)  I have notified [id number]- [place] [address](Ph: [phone] of this approval and was advised that a PA is needed for Jardiance only and that Metoprolol and Ezetimibe are both covered without a PA required.  Will work on [employer].

## 2022-12-05 NOTE — Telephone Encounter (Signed)
**Note De-Identified [name] Obfuscation** Jardiance PA started through covermymeds. Key: [id number]

## 2022-12-06 NOTE — Telephone Encounter (Signed)
PA approved [id number] valid 12/04/22-06/02/23. Patient contacted and advised to put reminder in phone for last week of June so that we can do a renewal PA.

## 2022-12-17 ENCOUNTER — Encounter
Payer: Medicaid Other | Attending: Physical Medicine and Rehabilitation | Admitting: Physical Medicine and Rehabilitation

## 2022-12-17 ENCOUNTER — Encounter: Payer: Self-pay | Admitting: Physical Medicine and Rehabilitation

## 2022-12-17 VITALS — BP 103/67 | HR 88 | Ht 64.0 in

## 2022-12-17 DIAGNOSIS — M65331 Trigger finger, right middle finger: Secondary | ICD-10-CM | POA: Insufficient documentation

## 2022-12-17 DIAGNOSIS — G894 Chronic pain syndrome: Secondary | ICD-10-CM | POA: Diagnosis not present

## 2022-12-17 DIAGNOSIS — M25552 Pain in left hip: Secondary | ICD-10-CM | POA: Insufficient documentation

## 2022-12-17 DIAGNOSIS — M65332 Trigger finger, left middle finger: Secondary | ICD-10-CM | POA: Diagnosis present

## 2022-12-17 DIAGNOSIS — M5416 Radiculopathy, lumbar region: Secondary | ICD-10-CM | POA: Diagnosis not present

## 2022-12-17 MED ORDER — LIDOCAINE 5 % EX OINT: 1.0000 | TOPICAL_OINTMENT | Freq: Four times a day (QID) | CUTANEOUS | 5 refills | Status: DC | PRN

## 2022-12-17 MED ORDER — HYDROCODONE-ACETAMINOPHEN 5-325 MG PO TABS: 1.0000 | ORAL_TABLET | Freq: Four times a day (QID) | ORAL | 0 refills | Status: DC | PRN

## 2022-12-17 NOTE — Patient Instructions (Signed)
Pt is a 57 yr old female with hx of DM,- A1c 6.0;  Anxiety, R breast invasive ductal cancer of R breast in 2012 neuropathy in hands and feet; and arthritis and pain in legs.  Also hx of lumbar surgery- been on Norco for "years".  Grief reaction and depression over death of sister.     Lidocaine ointment- 5% for back as well as hand pain- esp when when gets injections.  Can put thicker layer of it in place, then put saran wrap on it for 30 minutes or so and that will help numb it better.    2. Refill Norco - next due 01/03/23- we discussed my concern about increasing dosage/number of pills, however my concern is what do we do when in 5-10 years, need to keep increasing meds, what do we do- also as people age, cannot tolerate as much pain meds. So I'd rather not increase at this time.    3.  Call ortho about trigger fingers injections -since sticking again   4. Order MRI lumbar spine since pain getting worse with sciatica/radiculopathy Sx's and midline back /lumbar pain- has no implants- has tried ot treat for >3 years without improvement- actually progressively worse and need to image to make sure no issues- no obvious red flags.    5. Will get xray of pelvis due to L ischial tuberosity pain-  [facility] [address]    6. F/U in 93month- chronic back pain  7. Will give you a call when MRI comes back- but call me when does it, so I can look out for it.

## 2022-12-17 NOTE — Progress Notes (Addendum)
Subjective:    Patient ID: MITSUYO SCHILDER, female    DOB: [birth year], 57 y.o.   MRN: [id number]  HPI  Pt is a 57 yr old female with hx of DM,- A1c 6.1;  Anxiety, R breast invasive ductal cancer of R breast in 2012 neuropathy in hands and feet; and arthritis and pain in legs.  Also hx of lumbar surgery- been on Norco for "years".  Grief reaction and depression over death of sister.     Hurting more than normal Fell 1 month ago- jarred her and hurting more ever since. Lost balance- mouse in house and turned around and saw mouse and lost balance.   Feels like legs getting weaker -  Felt like was going to fall when going up a curb today.  Been going on for 2-3 weeks, just B/L legs feeling weaker, but LLE worse than RLE.   Hands- having problems making a fist- 3rd fingers get 'stuck" b/l  Needs to make appt with Ortho for hands. To get another injection for trigger fingers.   Using Norco 10 mg at night but still having pain at night.     Pain Inventory Average Pain 8 Pain Right Now 5 My pain is sharp, burning, and stabbing  In the last 24 hours, has pain interfered with the following? General activity 5 Relation with others 4 Enjoyment of life 5 What TIME of day is your pain at its worst? morning , daytime, evening, and night Sleep (in general) Poor  Pain is worse with: walking, bending, sitting, inactivity, standing, and some activites Pain improves with: rest and medication Relief from Meds: 7  Family History  Problem Relation Age of Onset   Colon cancer Mother        mid-50s, died of metastatic disease   Cancer Mother        liver   Coronary artery disease Mother    Diabetes type I Mother    Peripheral vascular disease Mother        Carotid disease in her 32s   Coronary artery disease Father        CAD in his 56s   Diabetes Father    Diabetes type I Father    Hypertension Father    Bipolar disorder Sister    Coronary artery disease Brother        MI in his  53s   Hypertension Brother    Hypertension Maternal Grandmother    Hyperlipidemia Maternal Grandmother    Stroke Maternal Grandmother    Hypertension Maternal Grandfather    Diabetes Maternal Grandfather    Diabetes Paternal Grandmother    Heart disease Paternal Grandfather    Social History   Socioeconomic History   Marital status: Married    Spouse name: Not on file   Number of children: 3   Years of education: Not on file   Highest education level: Not on file  Occupational History   Occupation: child care    Employer: [employer]  Tobacco Use   Smoking status: Never   Smokeless tobacco: Never  Vaping Use   Vaping Use: Never used  Substance and Sexual Activity   Alcohol use: No    Alcohol/week: 0.0 standard drinks of alcohol   Drug use: No   Sexual activity: Yes    Partners: Male    Birth control/protection: None  Other Topics Concern   Not on file  Social History Narrative   Not on file   Social Determinants of  Health   Financial Resource Strain: Not on file  Food Insecurity: Not on file  Transportation Needs: Not on file  Physical Activity: Not on file  Stress: Not on file  Social Connections: Not on file   Past Surgical History:  Procedure Laterality Date   back surg x2     BALLOON DILATION N/A 09/22/2021   Procedure: BALLOON DILATION;  Surgeon: Eloise Harman, DO;  [facility];  Service: Endoscopy;  Laterality: N/A;   BIOPSY  09/22/2021   Procedure: BIOPSY;  Surgeon: Eloise Harman, DO;  [facility];  Service: Endoscopy;;   BREAST LUMPECTOMY Right 2012   CHOLECYSTECTOMY     COLONOSCOPY  11/03/07   4-mm sessile polyp removed/small internal hemorrhoids/tubular adenoma, random colon bx negative for microscopic colitis   COLONOSCOPY WITH PROPOFOL N/A 09/17/2016   Grade 2 hemorrhoids, colonic diverticulosis, ascending colon polyp (sessile serrated adenoma), 5 year surveillance   COLONOSCOPY WITH PROPOFOL N/A 09/22/2021    Procedure: COLONOSCOPY WITH PROPOFOL;  Surgeon: Eloise Harman, DO;  [facility];  Service: Endoscopy;  Laterality: N/A;  7:30am   COLONOSCOPY, ESOPHAGOGASTRODUODENOSCOPY (EGD) AND ESOPHAGEAL DILATION  01/2012   mild gastritis s/p biopsy. Empiric dilation. Normal duodenum.    DILATATION & CURETTAGE/HYSTEROSCOPY WITH MYOSURE N/A 02/10/2015   Procedure: DILATATION & CURETTAGE/HYSTEROSCOPY WITH MYOSURE;  Surgeon: Nunzio Cobbs, MD;  Location: [facility];  Service: Gynecology;  Laterality: N/A;   ESOPHAGOGASTRODUODENOSCOPY (EGD) WITH PROPOFOL N/A 01/11/2020   Normal esophagus, s/p dilation, normal stomach, normal duodenum.    ESOPHAGOGASTRODUODENOSCOPY (EGD) WITH PROPOFOL N/A 09/22/2021   Procedure: ESOPHAGOGASTRODUODENOSCOPY (EGD) WITH PROPOFOL;  Surgeon: Eloise Harman, DO;  [facility];  Service: Endoscopy;  Laterality: N/A;   MALONEY DILATION N/A 01/11/2020   Procedure: Venia Minks DILATION;  Surgeon: Daneil Dolin, MD;  [facility];  Service: Endoscopy;  Laterality: N/A;   MM BREAST STEREO BX*L*R/S     rt.   POLYPECTOMY  09/17/2016   Procedure: POLYPECTOMY;  Surgeon: Daneil Dolin, MD;  [facility];  Service: Endoscopy;;  ascending colon   POLYPECTOMY  09/22/2021   Procedure: POLYPECTOMY;  Surgeon: Eloise Harman, DO;  [facility];  Service: Endoscopy;;   PORT-A-CATH REMOVAL  05/26/2012   Procedure: REMOVAL PORT-A-CATH;  Surgeon: Jamesetta So, MD;  [facility];  Service: General;  Laterality: N/A;  Minor Room   PORTACATH PLACEMENT     Past Surgical History:  Procedure Laterality Date   back surg x2     BALLOON DILATION N/A 09/22/2021   Procedure: BALLOON DILATION;  Surgeon: Eloise Harman, DO;  [facility];  Service: Endoscopy;  Laterality: N/A;   BIOPSY  09/22/2021   Procedure: BIOPSY;  Surgeon: Eloise Harman, DO;  [facility];  Service: Endoscopy;;   BREAST LUMPECTOMY Right 2012    CHOLECYSTECTOMY     COLONOSCOPY  11/03/07   4-mm sessile polyp removed/small internal hemorrhoids/tubular adenoma, random colon bx negative for microscopic colitis   COLONOSCOPY WITH PROPOFOL N/A 09/17/2016   Grade 2 hemorrhoids, colonic diverticulosis, ascending colon polyp (sessile serrated adenoma), 5 year surveillance   COLONOSCOPY WITH PROPOFOL N/A 09/22/2021   Procedure: COLONOSCOPY WITH PROPOFOL;  Surgeon: Eloise Harman, DO;  [facility];  Service: Endoscopy;  Laterality: N/A;  7:30am   COLONOSCOPY, ESOPHAGOGASTRODUODENOSCOPY (EGD) AND ESOPHAGEAL DILATION  01/2012   mild gastritis s/p biopsy. Empiric dilation. Normal duodenum.  DILATATION & CURETTAGE/HYSTEROSCOPY WITH MYOSURE N/A 02/10/2015   Procedure: DILATATION & CURETTAGE/HYSTEROSCOPY WITH MYOSURE;  Surgeon: Nunzio Cobbs, MD;  Location: [facility];  Service: Gynecology;  Laterality: N/A;   ESOPHAGOGASTRODUODENOSCOPY (EGD) WITH PROPOFOL N/A 01/11/2020   Normal esophagus, s/p dilation, normal stomach, normal duodenum.    ESOPHAGOGASTRODUODENOSCOPY (EGD) WITH PROPOFOL N/A 09/22/2021   Procedure: ESOPHAGOGASTRODUODENOSCOPY (EGD) WITH PROPOFOL;  Surgeon: Eloise Harman, DO;  [facility];  Service: Endoscopy;  Laterality: N/A;   MALONEY DILATION N/A 01/11/2020   Procedure: Venia Minks DILATION;  Surgeon: Daneil Dolin, MD;  [facility];  Service: Endoscopy;  Laterality: N/A;   MM BREAST STEREO BX*L*R/S     rt.   POLYPECTOMY  09/17/2016   Procedure: POLYPECTOMY;  Surgeon: Daneil Dolin, MD;  [facility];  Service: Endoscopy;;  ascending colon   POLYPECTOMY  09/22/2021   Procedure: POLYPECTOMY;  Surgeon: Eloise Harman, DO;  [facility];  Service: Endoscopy;;   PORT-A-CATH REMOVAL  05/26/2012   Procedure: REMOVAL PORT-A-CATH;  Surgeon: Jamesetta So, MD;  [facility];  Service: General;  Laterality: N/A;  Minor Room   PORTACATH PLACEMENT     Past Medical  History:  Diagnosis Date   Anxiety    Arthritis    Blood transfusion without reported diagnosis 1999   due to heavy menses   Breast cancer ([place]) 05/22/2011   03/01/11, Stage 2, s/p lumpectomy, chemo/xrt   Complication of anesthesia    pt woke up during procedure   DDD (degenerative disc disease), lumbar    Depression 06/22/2011   Diverticulitis    DM (diabetes mellitus) ([place]) 06/22/2011   Genital warts    GERD (gastroesophageal reflux disease) 06/22/2011   Hx of adenomatous colonic polyps 10/2007   69m sigmoid tubular adenoma, FH colon cancer, mother in mid-50s   Hypercholesterolemia 06/22/2011   Hypertension 06/22/2011   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[facility] 05/22/2011   Iron deficiency anemia 03/25/2015   LBBB (left bundle branch block)    Mild CAD    Morbid obesity ([facility]    [place]mic [place]) [place]    PSVT (paroxysmal supraventricular tachycardia)    Shortness of breath    STD (sexually transmitted disease)    HPV, 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   Syncope    Syncope    Vaginal bleeding 03/08/2014   Ht '5\' 4"'$  (1.626 m)   LMP 04/05/2016 Comment: spotting 11/2016 and 12/2016  BMI 40.51 kg/m   Opioid Risk Score:   Fall Risk Score:  `1  [facility]     12/17/2022   12:54 PM 10/20/2021    3:10 PM 07/14/2021    3:19 PM 05/05/2021    2:37 PM 04/14/2021    1:11 PM 10/04/2020    2:52 PM 06/08/2017   11:26 PM  Depression screen PHQ 2/9  Decreased Interest 0 '3 1 3 2 1 1  '$ Down, Depressed, Hopeless 0 '3 1 3 2 1 1  '$ PHQ - 2 Score 0 '6 2 6 4 2 2  '$ Altered sleeping    '1 2 1 1  '$ Tired, decreased energy    '3 2 2 1  '$ Change in appetite    1 1 0 1  Feeling bad or failure about yourself     '1 1 1 '$ 0  Trouble concentrating    '2 3 1 1  '$ Moving slowly or fidgety/restless    '1 3 2 '$ 0  Suicidal thoughts    1 0 0   PHQ-9 Score    '16 16 9 6  '$ Difficult doing work/chores     Extremely dIfficult Somewhat difficult Somewhat difficult      Review of Systems   Musculoskeletal:  Positive for back pain, gait problem and neck pain.  All other systems reviewed and are negative.     Objective:   Physical Exam  Awake, alert, appropriate, accompanied by husband, NAD Weight appears stable Trigger fingers B/L 3rd digits TTP over L ischial tuberosity and midline back pain Associated trigger points, but not as TTP Increased pain with lumbar flexion compared to lumbar extension.         Assessment & Plan:   Pt is a 57 yr old female with hx of DM,- A1c 6.0;  Anxiety, R breast invasive ductal cancer of R breast in 2012 neuropathy in hands and feet; and arthritis and pain in legs.  Also hx of lumbar surgery- been on Norco for "years".  Grief reaction and depression over death of sister.     Lidocaine ointment- 5% for back as well as hand pain- esp when when gets injections.  Can put thicker layer of it in place, then put saran wrap on it for 30 minutes or so and that will help numb it better.    2. Refill Norco - next due 01/03/23- we discussed my concern about increasing dosage/number of pills, however my concern is what do we do when in 5-10 years, need to keep increasing meds, what do we do- also as people age, cannot tolerate as much pain meds. So I'd rather not increase at this time.    3.  Call ortho about trigger fingers injections -since sticking again   4. Order MRI lumbar spine since pain getting worse with sciatica/radiculopathy Sx's and midline back /lumbar pain- has no implants- has tried ot treat for >3 years without improvement- actually progressively worse and need to image to make sure no issues- no obvious red flags.  Has attempted home exercise program that I gave patient 3x/week for last 6 months without significant improvement.    5. Will get xray of pelvis due to L ischial tuberosity pain-  [facility] [address] [place].    6. F/U in 86month- chronic back pain  7. Will give you a call when MRI comes back- but  call me when does it, so I can look out for it.    I spent a total of  32  minutes on total care today- >50% coordination of care- due to education about pain meds; MRI and xray need; as well as lidocaine ointment

## 2022-12-20 DIAGNOSIS — Z419 Encounter for procedure for purposes other than remedying health state, unspecified: Secondary | ICD-10-CM | POA: Diagnosis not present

## 2022-12-25 ENCOUNTER — Institutional Professional Consult (permissible substitution): Payer: Medicaid Other | Admitting: Cardiovascular Disease

## 2022-12-26 NOTE — Progress Notes (Deleted)
57 y.o. G30P0013 Married Caucasian female here for annual exam.    PCP:     Patient's last menstrual period was 04/05/2016.           Sexually active: {yes no:314532}  The current method of family planning is post menopausal status.    Exercising: {yes no:314532}  {types:19826} Smoker:  no  Health Maintenance: Pap:  01/04/22 neg:HR HPV neg, 01/31/18 neg;HR HPV neg History of abnormal Pap:  yes, 09-08-14 Ascus:Pos HR HPV with colposcopy revealing LGSIL on exocervical biopsy/2010 abnormal pap with colposcopy but no treatment to cervix.  Repeat pap was normal.  1990's had colposcopy with LEEP procedure to cervix.  MMG:  05/03/22 Breast Density Category B, BI-RADS CATEGORY 1 Neg Colonoscopy:  09/22/21 BMD:   n/a  Result  n/a TDaP:  06/01/15 Gardasil:   no HIV: 01/18/17 NR Hep C: 01/18/17 neg Screening Labs:  Hb today: ***, Urine today: ***   reports that she has never smoked. She has never used smokeless tobacco. She reports that she does not drink alcohol and does not use drugs.  Past Medical History:  Diagnosis Date   Anxiety    Arthritis    Blood transfusion without reported diagnosis 1999   due to heavy menses   Breast cancer ([place]) 05/22/2011   03/01/11, Stage 2, s/p lumpectomy, chemo/xrt   Complication of anesthesia    pt woke up during procedure   DDD (degenerative disc disease), lumbar    Depression 06/22/2011   Diverticulitis    DM (diabetes mellitus) ([place]) 06/22/2011   Genital warts    GERD (gastroesophageal reflux disease) 06/22/2011   Hx of adenomatous colonic polyps 10/2007   41m sigmoid tubular adenoma, FH colon cancer, mother in mid-50s   Hypercholesterolemia 06/22/2011   Hypertension 06/22/2011   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 05/22/[place]   Iron deficiency anemia 03/25/2015   LBBB (left bundle branch block)    Mild CAD    Morbid obesity ([facility])    N[facility] ([place]ischemic cardiomyopathy) ([facility]    PSVT (paroxysmal supraventricular tachycardia)    Shortness  of breath    STD (sexually transmitted disease)    HPV, 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   Syncope    Syncope    Vaginal bleeding 03/08/2014    Past Surgical History:  Procedure Laterality Date   back surg x2     BALLOON DILATION N/A 09/22/2021   Procedure: BALLOON DILATION;  Surgeon: CEloise Harman DO;  [facility];  Service: Endoscopy;  Laterality: N/A;   BIOPSY  09/22/2021   Procedure: BIOPSY;  Surgeon: CEloise Harman DO;  [facility];  Service: Endoscopy;;   BREAST LUMPECTOMY Right 2012   CHOLECYSTECTOMY     COLONOSCOPY  11/03/07   4-mm sessile polyp removed/small internal hemorrhoids/tubular adenoma, random colon bx negative for microscopic colitis   COLONOSCOPY WITH PROPOFOL N/A 09/17/2016   Grade 2 hemorrhoids, colonic diverticulosis, ascending colon polyp (sessile serrated adenoma), 5 year surveillance   COLONOSCOPY WITH PROPOFOL N/A 09/22/2021   Procedure: COLONOSCOPY WITH PROPOFOL;  Surgeon: CEloise Harman DO;  [facility];  Service: Endoscopy;  Laterality: N/A;  7:30am   COLONOSCOPY, ESOPHAGOGASTRODUODENOSCOPY (EGD) AND ESOPHAGEAL DILATION  01/2012   mild gastritis s/p biopsy. Empiric dilation. Normal duodenum.    DILATATION & CURETTAGE/HYSTEROSCOPY WITH MYOSURE N/A 02/10/2015   Procedure: DILATATION & CURETTAGE/HYSTEROSCOPY WITH MYOSURE;  Surgeon: BNunzio Cobbs MD;  [facility];  Service: Gynecology;  Laterality: N/A;  ESOPHAGOGASTRODUODENOSCOPY (EGD) WITH PROPOFOL N/A 01/11/2020   Normal esophagus, s/p dilation, normal stomach, normal duodenum.    ESOPHAGOGASTRODUODENOSCOPY (EGD) WITH PROPOFOL N/A 09/22/2021   Procedure: ESOPHAGOGASTRODUODENOSCOPY (EGD) WITH PROPOFOL;  Surgeon: Eloise Harman, DO;  [facility];  Service: Endoscopy;  Laterality: N/A;   MALONEY DILATION N/A 01/11/2020   Procedure: Venia Minks DILATION;  Surgeon: Daneil Dolin, MD;  [facility];  Service: Endoscopy;  Laterality:  N/A;   MM BREAST STEREO BX*L*R/S     rt.   POLYPECTOMY  09/17/2016   Procedure: POLYPECTOMY;  Surgeon: Daneil Dolin, MD;  [facility];  Service: Endoscopy;;  ascending colon   POLYPECTOMY  09/22/2021   Procedure: POLYPECTOMY;  Surgeon: Eloise Harman, DO;  [facility];  Service: Endoscopy;;   PORT-A-CATH REMOVAL  05/26/2012   Procedure: REMOVAL PORT-A-CATH;  Surgeon: Jamesetta So, MD;  [facility];  Service: General;  Laterality: N/A;  Minor Room   PORTACATH PLACEMENT      Current Outpatient Medications  Medication Sig Dispense Refill   atorvastatin (LIPITOR) 40 MG tablet TAKE 1 TABLET BY MOUTH EVERYDAY AT BEDTIME 30 tablet 2   blood glucose meter kit and supplies Dispense based on patient and insurance preference. Use up to four times daily as directed. (FOR ICD-10 E10.9, E11.9). 1 each 5   cetirizine (ZYRTEC) 10 MG tablet TAKE 1 TABLET BY MOUTH EVERY DAY 90 tablet 1   citalopram (CELEXA) 40 MG tablet TAKE 1 TABLET BY MOUTH EVERY DAY 90 tablet 1   Dulaglutide (TRULICITY) 8.29 FA/2.1HY SOPN INJECT 0.75 MG SUBCUTANEOUSLY ONCE A WEEK 6 mL 1   DULoxetine (CYMBALTA) 30 MG capsule Take 1 capsule (30 mg total) by mouth daily. 30 capsule 5   empagliflozin (JARDIANCE) 10 MG TABS tablet Take 1 tablet (10 mg total) by mouth daily before breakfast. 30 tablet 3   ezetimibe (ZETIA) 10 MG tablet Take 1 tablet (10 mg total) by mouth daily. 30 tablet 3   fluticasone (FLONASE) 50 MCG/ACT nasal spray USE 2 SPRAY(S) IN EACH NOSTRIL ONCE DAILY AS NEEDED 16 mL 5   HYDROcodone-acetaminophen (NORCO/VICODIN) 5-325 MG tablet Take 1 tablet by mouth every 6 (six) hours as needed for severe pain. Due for refill 01/03/23 120 tablet 0   lidocaine (XYLOCAINE) 5 % ointment Apply 1 Application topically 4 (four) times daily as needed for mild pain (back or hands). 50 g 5   metFORMIN (GLUCOPHAGE-XR) 750 MG 24 hr tablet TAKE 1 TABLET BY MOUTH EVERY DAY WITH BREAKFAST 30 tablet 2   metoprolol  succinate (TOPROL XL) 50 MG 24 hr tablet Take 1 tablet (50 mg total) by mouth daily. 30 tablet 11   Multiple Vitamin (MULTIVITAMIN) capsule Take 1 capsule by mouth daily.     Omega-3 Fatty Acids (FISH OIL) 1000 MG CAPS Take 1,000 mg by mouth daily.     ondansetron (ZOFRAN-ODT) 8 MG disintegrating tablet DISSOLVE 1 TABLET IN MOUTH EVERY 8 HOURS AS NEEDED FOR NAUSEA OR VOMITING. 18 tablet 0   pantoprazole (PROTONIX) 40 MG tablet Take 1 tablet (40 mg total) by mouth 2 (two) times daily before a meal. 90 tablet 3   sacubitril-valsartan (ENTRESTO) 24-26 MG Take 1 tablet by mouth 2 (two) times daily. 60 tablet 11   Vitamin D, Ergocalciferol, (DRISDOL) 1.25 MG (50000 UNIT) CAPS capsule Take 1 capsule (50,000 Units total) by mouth once a week. 12 capsule 3   No current facility-administered medications for this visit.  Family History  Problem Relation Age of Onset   Colon cancer Mother        mid-50s, died of metastatic disease   Cancer Mother        liver   Coronary artery disease Mother    Diabetes type I Mother    Peripheral vascular disease Mother        Carotid disease in her 37s   Coronary artery disease Father        CAD in his 32s   Diabetes Father    Diabetes type I Father    Hypertension Father    Bipolar disorder Sister    Coronary artery disease Brother        MI in his 78s   Hypertension Brother    Hypertension Maternal Grandmother    Hyperlipidemia Maternal Grandmother    Stroke Maternal Grandmother    Hypertension Maternal Grandfather    Diabetes Maternal Grandfather    Diabetes Paternal Grandmother    Heart disease Paternal Grandfather     Review of Systems  Exam:   LMP 04/05/2016 Comment: spotting 11/2016 and 12/2016    General appearance: alert, cooperative and appears stated age Head: normocephalic, without obvious abnormality, atraumatic Neck: no adenopathy, supple, symmetrical, trachea midline and thyroid normal to inspection and palpation Lungs: clear to  auscultation bilaterally Breasts: normal appearance, no masses or tenderness, No nipple retraction or dimpling, No nipple discharge or bleeding, No axillary adenopathy Heart: regular rate and rhythm Abdomen: soft, non-tender; no masses, no organomegaly Extremities: extremities normal, atraumatic, no cyanosis or edema Skin: skin color, texture, turgor normal. No rashes or lesions Lymph nodes: cervical, supraclavicular, and axillary nodes normal. Neurologic: grossly normal  Pelvic: External genitalia:  no lesions              No abnormal inguinal nodes palpated.              Urethra:  normal appearing urethra with no masses, tenderness or lesions              Bartholins and Skenes: normal                 Vagina: normal appearing vagina with normal color and discharge, no lesions              Cervix: no lesions              Pap taken: {yes no:314532} Bimanual Exam:  Uterus:  normal size, contour, position, consistency, mobility, non-tender              Adnexa: no mass, fullness, tenderness              Rectal exam: {yes no:314532}.  Confirms.              Anus:  normal sphincter tone, no lesions  Chaperone was present for exam:  ***  Assessment:   Well woman visit with gynecologic exam.   Plan: Mammogram screening discussed. Self breast awareness reviewed. Pap and HR HPV as above. Guidelines for Calcium, Vitamin D, regular exercise program including cardiovascular and weight bearing exercise.   Follow up annually and prn.   Additional counseling given.  {yes [id number]. _______ minutes face to face time of which over 50% was spent in counseling.    After visit summary provided.

## 2023-01-01 ENCOUNTER — Other Ambulatory Visit: Payer: Self-pay | Admitting: Family Medicine

## 2023-01-01 ENCOUNTER — Telehealth: Payer: Self-pay | Admitting: *Deleted

## 2023-01-01 DIAGNOSIS — J302 Other seasonal allergic rhinitis: Secondary | ICD-10-CM

## 2023-01-01 DIAGNOSIS — F324 Major depressive disorder, single episode, in partial remission: Secondary | ICD-10-CM

## 2023-01-01 NOTE — Telephone Encounter (Signed)
Patient left message today  01/01/23 requesting refill on Hydrocodone [facility].

## 2023-01-02 ENCOUNTER — Other Ambulatory Visit: Payer: Self-pay

## 2023-01-02 ENCOUNTER — Other Ambulatory Visit: Payer: Self-pay | Admitting: Family Medicine

## 2023-01-02 MED ORDER — HYDROCODONE-ACETAMINOPHEN 5-325 MG PO TABS: 1.0000 | ORAL_TABLET | Freq: Four times a day (QID) | ORAL | 0 refills | Status: DC | PRN

## 2023-01-02 MED ORDER — EMPAGLIFLOZIN 10 MG PO TABS: 10.0000 mg | ORAL_TABLET | Freq: Every day | ORAL | 3 refills | Status: DC

## 2023-01-02 NOTE — Telephone Encounter (Signed)
Pt's medication was sent to pt's pharmacy as requested. Confirmation received.  °

## 2023-01-03 ENCOUNTER — Other Ambulatory Visit: Payer: Self-pay | Admitting: Family Medicine

## 2023-01-03 NOTE — Telephone Encounter (Signed)
Done

## 2023-01-08 ENCOUNTER — Ambulatory Visit: Payer: 59 | Admitting: Obstetrics and Gynecology

## 2023-01-10 ENCOUNTER — Ambulatory Visit: Payer: 59 | Admitting: Family Medicine

## 2023-01-10 ENCOUNTER — Other Ambulatory Visit: Payer: Medicaid Other

## 2023-01-17 ENCOUNTER — Encounter: Payer: Self-pay | Admitting: Radiology

## 2023-01-18 DIAGNOSIS — Z419 Encounter for procedure for purposes other than remedying health state, unspecified: Secondary | ICD-10-CM | POA: Diagnosis not present

## 2023-01-25 ENCOUNTER — Other Ambulatory Visit: Payer: Self-pay | Admitting: Registered Nurse

## 2023-01-25 ENCOUNTER — Other Ambulatory Visit: Payer: Self-pay | Admitting: Gastroenterology

## 2023-01-28 ENCOUNTER — Telehealth: Payer: Self-pay | Admitting: Registered Nurse

## 2023-01-28 NOTE — Telephone Encounter (Signed)
Call Placed to [name] regarding her Cymbalta, no answer. Awaiting a return call.  Dr Dagoberto Ligas note reviewed on 09/07/2022. She is aware she is on Cymbalta and Celexa.

## 2023-01-31 ENCOUNTER — Ambulatory Visit: Payer: Medicaid Other | Admitting: Cardiovascular Disease

## 2023-02-05 ENCOUNTER — Encounter: Payer: Self-pay | Admitting: Orthopaedic Surgery

## 2023-02-05 ENCOUNTER — Ambulatory Visit (INDEPENDENT_AMBULATORY_CARE_PROVIDER_SITE_OTHER): Payer: Medicaid Other | Admitting: Orthopaedic Surgery

## 2023-02-05 DIAGNOSIS — M65332 Trigger finger, left middle finger: Secondary | ICD-10-CM | POA: Diagnosis not present

## 2023-02-05 DIAGNOSIS — M65331 Trigger finger, right middle finger: Secondary | ICD-10-CM

## 2023-02-05 MED ORDER — METHYLPREDNISOLONE ACETATE 40 MG/ML IJ SUSP
40.0000 mg | Freq: Once | INTRAMUSCULAR | Status: AC
  Administered 2023-02-05: 40 mg via INTRA_ARTICULAR

## 2023-02-05 NOTE — Patient Instructions (Signed)
Think about having a trigger release surgery. These injections may not help this time.   Dr.Harrison or Dr.Cairns can see you for that. They both do surgery.

## 2023-02-05 NOTE — Progress Notes (Signed)
PROCEDURE  Trigger Finger Injection  The right Middle finger  has been locking at the A1 pulley.  The patient has been told about injection of the digit.  Surgical correction and excision of the A1 pulley will resolve the problem.  Ani injection in the digit should help but the results may be short lived.  The patient asked appropriate questions and understands the procedure.  The patient has elected for an injection at this time.  Verbal consent was obtained.  A timeout was taken to confirm the proper hand and digit.  Medication  1 mL of DepoMedrol 40mg   2 mL of 1% lidocaine plain  Ethyl chloride for anesthesia  Alcohol was used to prepare the skin along with ethyl chloride and then the injection was made at the A1 pulley there were no complications.  It was tolerated well.  A Band-aid dressing was applied.  Call if any problem or difficulty.   PROCEDURE  Trigger Finger Injection  The left Middle finger  has been locking at the A1 pulley.  The patient has been told about injection of the digit.  Surgical correction and excision of the A1 pulley will resolve the problem.  Ani injection in the digit should help but the results may be short lived.  The patient asked appropriate questions and understands the procedure.  The patient has elected for an injection at this time.  Verbal consent was obtained.  A timeout was taken to confirm the proper hand and digit.  Medication  1 mL of DepoMedrol 40 mg  2 mL of 1% lidocaine plain  Ethyl chloride for anesthesia  Alcohol was used to prepare the skin along with ethyl chloride and then the injection was made at the A1 pulley there were no complications.  It was tolerated well.  A Band-aid dressing was applied.  Call if any problem or difficulty.   Encounter Diagnoses  Name Primary?   Trigger middle finger of left hand Yes   Trigger finger, right middle finger    I have told her to seriously consider trigger finger surgery for release of  the A1 pulley.  She will.  I will see prn.  Call if any problem.  Precautions discussed.  Electronically Signed Sanjuana Kava, MD 3/19/20248:47 AM

## 2023-02-12 ENCOUNTER — Ambulatory Visit: Payer: 59 | Admitting: Family Medicine

## 2023-02-18 DIAGNOSIS — Z419 Encounter for procedure for purposes other than remedying health state, unspecified: Secondary | ICD-10-CM | POA: Diagnosis not present

## 2023-02-19 ENCOUNTER — Other Ambulatory Visit: Payer: Self-pay | Admitting: Family Medicine

## 2023-02-19 DIAGNOSIS — F324 Major depressive disorder, single episode, in partial remission: Secondary | ICD-10-CM

## 2023-02-20 ENCOUNTER — Other Ambulatory Visit: Payer: Self-pay | Admitting: Family Medicine

## 2023-02-25 ENCOUNTER — Telehealth (HOSPITAL_COMMUNITY): Payer: Self-pay | Admitting: *Deleted

## 2023-02-25 NOTE — Telephone Encounter (Signed)
Reaching out to patient to offer assistance regarding upcoming cardiac imaging study; pt verbalizes understanding of appt date/time, parking situation and where to check in, and verified current allergies; name and call back number provided for further questions should they arise ° °Reathel Turi RN Navigator Cardiac Imaging °[facility] °[phone] office °[phone] cell ° °Patient denies metal but does report some mild claustrophobia.  °

## 2023-02-26 ENCOUNTER — Other Ambulatory Visit: Payer: Self-pay | Admitting: Physician Assistant

## 2023-02-26 ENCOUNTER — Ambulatory Visit (HOSPITAL_COMMUNITY)
Admission: RE | Admit: 2023-02-26 | Discharge: 2023-02-26 | Disposition: A | Discharge status: Home / Self Care | Payer: Medicaid Other | Source: Ambulatory Visit | Attending: Physician Assistant | Admitting: Physician Assistant

## 2023-02-26 ENCOUNTER — Ambulatory Visit
Admission: RE | Admit: 2023-02-26 | Discharge: 2023-02-26 | Disposition: A | Discharge status: Home / Self Care | Payer: Medicaid Other | Source: Ambulatory Visit | Attending: Physical Medicine and Rehabilitation | Admitting: Physical Medicine and Rehabilitation

## 2023-02-26 ENCOUNTER — Other Ambulatory Visit: Payer: Self-pay | Admitting: Physical Medicine and Rehabilitation

## 2023-02-26 DIAGNOSIS — I428 Other cardiomyopathies: Secondary | ICD-10-CM

## 2023-02-26 DIAGNOSIS — M65332 Trigger finger, left middle finger: Secondary | ICD-10-CM

## 2023-02-26 DIAGNOSIS — G894 Chronic pain syndrome: Secondary | ICD-10-CM

## 2023-02-26 DIAGNOSIS — M25552 Pain in left hip: Secondary | ICD-10-CM

## 2023-02-26 DIAGNOSIS — R55 Syncope and collapse: Secondary | ICD-10-CM

## 2023-02-26 DIAGNOSIS — M5416 Radiculopathy, lumbar region: Secondary | ICD-10-CM

## 2023-02-26 DIAGNOSIS — M65331 Trigger finger, right middle finger: Secondary | ICD-10-CM

## 2023-02-26 MED ORDER — GADOBUTROL 1 MMOL/ML IV SOLN
10.0000 mL | Freq: Once | INTRAVENOUS | Status: AC | PRN
  Administered 2023-02-26: 10 mL via INTRAVENOUS

## 2023-02-26 NOTE — Progress Notes (Signed)
57 y.o. [id number] Married Caucasian female here for annual exam.    FSH 22.7 and estradiol 18 on 01/04/22.  Has night sweats.   Denies vaginal bleeding and spotting.   Wears a pad due to discharge.  No itching or burning.  Some odor.  Wants evaluation.   Wants refill of Nystatin powder.  Has nonischemic cardiomyopathy with decreased cardiac functioning.  She is planning for a defibrillator and pacemaker which will be placed in June.  She is nervous about the procedures and is calling on her faith to help her.   PCP:   Everlene Other, DO  Patient's last menstrual period was 04/05/2016.           Sexually active: Yes.    The current method of family planning is post menopausal status.    Exercising: No.   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  Repeat pap was normal.  1990's had colposcopy with LEEP procedure to cervix.  MMG:  05/03/22 Breast Density Cat B, BI-RADS CAT 1 neg Colonoscopy:  09/22/21 - due in 2027. BMD:   n/a  Result  n/a TDaP:  06/01/15 Gardasil:   no HIV: 01/18/17 Neg Hep C: 08/23/22 NR Screening Labs:  PCP   reports that she has never smoked. She has never used smokeless tobacco. She reports that she does not drink alcohol and does not use drugs.  Past Medical History:  Diagnosis Date   Anxiety    Arthritis    Blood transfusion without reported diagnosis 1999   due to heavy menses   Breast cancer 05/22/2011   03/01/11, Stage 2, s/p lumpectomy, chemo/xrt   Complication of anesthesia    pt woke up during procedure   DDD (degenerative disc disease), lumbar    Depression 06/22/2011   Diverticulitis    DM (diabetes mellitus) 06/22/2011   Genital warts    GERD (gastroesophageal reflux disease) 06/22/2011   Hx of adenomatous colonic polyps 10/2007    4mm sigmoid tubular adenoma, FH colon cancer, mother in mid-50s   Hypercholesterolemia 06/22/2011   Hypertension 06/22/2011   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   Iron deficiency anemia 03/25/2015   LBBB (left bundle branch block)    Mild CAD    Morbid obesity    NICM (nonischemic cardiomyopathy)    PSVT (paroxysmal supraventricular tachycardia)    Shortness of breath    STD (sexually transmitted disease)    HPV, 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   Syncope    Syncope    Vaginal bleeding 03/08/2014    Past Surgical History:  Procedure Laterality Date   back surg x2     BALLOON DILATION N/A 09/22/2021   Procedure: BALLOON DILATION;  Surgeon: Lanelle Bal, DO;  [facility];  Service: Endoscopy;  Laterality: N/A;   BIOPSY  09/22/2021   Procedure: BIOPSY;  Surgeon: Lanelle Bal, DO;  [facility]: [facility];  Service: Endoscopy;;   BREAST LUMPECTOMY Right 2012   CHOLECYSTECTOMY     COLONOSCOPY  11/03/07   4-mm sessile polyp removed/small internal hemorrhoids/tubular adenoma, random colon bx negative for microscopic colitis   COLONOSCOPY WITH PROPOFOL N/A 09/17/2016   Grade 2 hemorrhoids, colonic diverticulosis, ascending colon polyp (sessile serrated adenoma), 5 year surveillance   COLONOSCOPY WITH PROPOFOL  N/A 09/22/2021   Procedure: COLONOSCOPY WITH PROPOFOL;  Surgeon: Lanelle Bal, DO;  [facility]: [facility];  Service: Endoscopy;  Laterality: N/A;  7:30am   COLONOSCOPY, ESOPHAGOGASTRODUODENOSCOPY (EGD) AND ESOPHAGEAL DILATION  01/2012   mild gastritis s/p biopsy. Empiric dilation. Normal duodenum.    DILATATION & CURETTAGE/HYSTEROSCOPY WITH MYOSURE N/A 02/10/2015   Procedure: DILATATION & CURETTAGE/HYSTEROSCOPY WITH MYOSURE;  Surgeon: Patton Salles, MD;  Location: [facility];  Service: Gynecology;  Laterality: N/A;   ESOPHAGOGASTRODUODENOSCOPY (EGD) WITH PROPOFOL N/A 01/11/2020   Normal esophagus, s/p dilation, normal stomach, normal  duodenum.    ESOPHAGOGASTRODUODENOSCOPY (EGD) WITH PROPOFOL N/A 09/22/2021   Procedure: ESOPHAGOGASTRODUODENOSCOPY (EGD) WITH PROPOFOL;  Surgeon: Lanelle Bal, DO;  [facility];  Service: Endoscopy;  Laterality: N/A;   MALONEY DILATION N/A 01/11/2020   Procedure: Elease Hashimoto DILATION;  Surgeon: Corbin Ade, MD;  [facility]: [facility];  Service: Endoscopy;  Laterality: N/A;   MM BREAST STEREO BX*L*R/S     rt.   POLYPECTOMY  09/17/2016   Procedure: POLYPECTOMY;  Surgeon: Corbin Ade, MD;  [facility];  Service: Endoscopy;;  ascending colon   POLYPECTOMY  09/22/2021   Procedure: POLYPECTOMY;  Surgeon: Lanelle Bal, DO;  [facility];  Service: Endoscopy;;   PORT-A-CATH REMOVAL  05/26/2012   Procedure: REMOVAL PORT-A-CATH;  Surgeon: [name], MD;  Location: [facility];  Service: General;  Laterality: N/A;  Minor Room   PORTACATH PLACEMENT      Current Outpatient Medications  Medication Sig Dispense Refill   atorvastatin (LIPITOR) 40 MG tablet TAKE 1 TABLET BY MOUTH EVERYDAY AT BEDTIME 30 tablet 2   blood glucose meter kit and supplies Dispense based on patient and insurance preference. Use up to four times daily as directed. (FOR ICD-10 E10.9, E11.9). 1 each 5   Blood Pressure KIT 1 Units by Does not apply route daily. 1 kit 0   cetirizine (ZYRTEC) 10 MG tablet TAKE 1 TABLET BY MOUTH EVERY DAY 90 tablet 1   citalopram (CELEXA) 40 MG tablet TAKE 1 TABLET BY MOUTH EVERY DAY 90 tablet 0   Dulaglutide (TRULICITY) 0.75 MG/0.5ML SOPN INJECT 0.75 MG SUBCUTANEOUSLY ONCE A WEEK 6 mL 1   DULoxetine (CYMBALTA) 30 MG capsule Take 1 capsule (30 mg total) by mouth daily. 90 capsule 1   empagliflozin (JARDIANCE) 10 MG TABS tablet Take 1 tablet (10 mg total) by mouth daily before breakfast. 90 tablet 3   fluticasone (FLONASE) 50 MCG/ACT nasal spray USE 2 SPRAY(S) IN EACH NOSTRIL ONCE DAILY AS NEEDED 16 mL 5   HYDROcodone-acetaminophen (NORCO/VICODIN) 5-325 MG  tablet Take 1 tablet by mouth every 6 (six) hours as needed for severe pain. Due for refill 01/03/23 120 tablet 0   lidocaine (XYLOCAINE) 5 % ointment Apply 1 Application topically 4 (four) times daily as needed for mild pain (back or hands). 50 g 5   metFORMIN (GLUCOPHAGE-XR) 750 MG 24 hr tablet TAKE 1 TABLET BY MOUTH EVERY DAY WITH BREAKFAST 30 tablet 2   metoprolol succinate (TOPROL XL) 50 MG 24 hr tablet Take 1 tablet (50 mg total) by mouth daily. 30 tablet 11   Multiple Vitamin (MULTIVITAMIN) capsule Take 1 capsule by mouth daily.     Omega-3 Fatty Acids (FISH OIL) 1000 MG CAPS Take 1,000 mg by mouth daily.     ondansetron (ZOFRAN-ODT) 8 MG disintegrating tablet DISSOLVE 1 TABLET IN MOUTH EVERY 8 HOURS AS NEEDED FOR NAUSEA OR VOMITING.  20 tablet 3   pantoprazole (PROTONIX) 40 MG tablet TAKE 1 TABLET (40 MG TOTAL) BY MOUTH TWICE A DAY BEFORE MEALS 180 tablet 1   sacubitril-valsartan (ENTRESTO) 24-26 MG Take 1 tablet by mouth 2 (two) times daily. 60 tablet 11   Vitamin D, Ergocalciferol, (DRISDOL) 1.25 MG (50000 UNIT) CAPS capsule Take 1 capsule (50,000 Units total) by mouth once a week. 12 capsule 3   No current facility-administered medications for this visit.    Family History  Problem Relation Age of Onset   Colon cancer Mother        mid-50s, died of metastatic disease   Cancer Mother        liver   Coronary artery disease Mother    Diabetes type I Mother    Peripheral vascular disease Mother        Carotid disease in her 9s   Coronary artery disease Father        CAD in his 59s   Diabetes Father    Diabetes type I Father    Hypertension Father    Bipolar disorder Sister    Coronary artery disease Brother        MI in his 59s   Hypertension Brother    Hypertension Maternal Grandmother    Hyperlipidemia Maternal Grandmother    Stroke Maternal Grandmother    Hypertension Maternal Grandfather    Diabetes Maternal Grandfather    Diabetes Paternal Grandmother    Heart  disease Paternal Grandfather     Review of Systems  All other systems reviewed and are negative.   Exam:   BP 128/80 (BP Location: Left Arm, Patient Position: Sitting, Cuff Size: Large)   Pulse 80   Ht 5\' 3"  (1.6 m)   Wt 234 lb (106.1 kg)   LMP 04/05/2016 Comment: spotting 11/2016 and 12/2016  SpO2 96%   BMI 41.45 kg/m     General appearance: alert, cooperative and appears stated age Head: normocephalic, without obvious abnormality, atraumatic Neck: no adenopathy, supple, symmetrical, trachea midline and thyroid normal to inspection and palpation Lungs: clear to auscultation bilaterally Breasts: left - normal appearance, no masses or tenderness, No nipple retraction or dimpling, No nipple discharge or bleeding, No axillary adenopathy Right - scar and induration of breast, no dominant masses, mildly tender, retraction of tissue along scar, No nipple discharge or bleeding, No axillary adenopathy Heart: regular rate and rhythm Abdomen: soft, non-tender; no masses, no organomegaly Extremities: extremities normal, atraumatic, no cyanosis or edema Skin: skin color, texture, turgor normal. No rashes or lesions Lymph nodes: cervical, supraclavicular, and axillary nodes normal. Neurologic: grossly normal  Pelvic: External genitalia:  no lesions              No abnormal inguinal nodes palpated.              Urethra:  normal appearing urethra with no masses, tenderness or lesions              Bartholins and Skenes: normal                 Vagina: normal appearing vagina with normal color and discharge, no lesions              Cervix: no lesions              Pap taken: no Bimanual Exam:  Uterus:  normal size, contour, position, consistency, mobility, non-tender              Adnexa: no  mass, fullness, tenderness              Rectal exam: yes.  Confirms.              Anus:  normal sphincter tone, no lesions  Chaperone was present for exam:  Warren Lacy, CMA  Assessment:   Well woman visit with  gynecologic exam. Hx right breast cancer.  Hx LGSIL.  Prior LEEP.  Hx fibroids and possible adenomyosis. Menopausal symptoms.  Vaginal discharge. FH of colon cancer in mother.   Plan: Mammogram screening discussed. Self breast awareness reviewed. She sees oncology again this summer.  Pap and HR HPV 2026. Guidelines for Calcium, Vitamin D, regular exercise program including cardiovascular and weight bearing exercise. Vaginitis testing.  Colonoscopy in 2027.  Follow up annually and prn.

## 2023-02-27 ENCOUNTER — Other Ambulatory Visit: Payer: Self-pay | Admitting: *Deleted

## 2023-02-27 ENCOUNTER — Telehealth: Payer: Self-pay

## 2023-02-27 NOTE — Telephone Encounter (Signed)
Patient notified and verbalized understanding. Patient had no questions or concerns at this time.  

## 2023-02-27 NOTE — Telephone Encounter (Signed)
Per paper fax from [facility] Patient requesting 90 day supply instead of 30 day.

## 2023-02-27 NOTE — Telephone Encounter (Signed)
-----   Message from Laurann Montana, [place] sent at 02/27/2023  7:56 AM EDT ----- Please let pt know that there is no evidence of infiltration or scar in the heart. Her EF remains lower than normal at 34% by this study. (Was 30-35% by echo so this is known.) Patient has f/u tomorrow with Dr. Jenene Slicker. Recommend keep this appointment to discuss further med titration. Will cc to Dr. Jenene Slicker so she is aware. Patient also has f/u appt scheduled with Dr. Nelly Laurence 4/16 for EP eval - originally scheduled for PSVT, but given LVEF continues to be <35% + LBBB, question whether she would benefit from CRT-D. Will update appt notes.

## 2023-02-28 ENCOUNTER — Ambulatory Visit: Payer: Medicaid Other | Attending: Internal Medicine | Admitting: Internal Medicine

## 2023-02-28 ENCOUNTER — Encounter: Payer: Self-pay | Admitting: Internal Medicine

## 2023-02-28 VITALS — BP 118/78 | HR 84 | Ht 64.0 in | Wt 233.0 lb

## 2023-02-28 DIAGNOSIS — I428 Other cardiomyopathies: Secondary | ICD-10-CM | POA: Diagnosis not present

## 2023-02-28 DIAGNOSIS — E119 Type 2 diabetes mellitus without complications: Secondary | ICD-10-CM | POA: Diagnosis not present

## 2023-02-28 DIAGNOSIS — E782 Mixed hyperlipidemia: Secondary | ICD-10-CM | POA: Diagnosis not present

## 2023-02-28 MED ORDER — DULOXETINE HCL 30 MG PO CPEP: 30.0000 mg | ORAL_CAPSULE | Freq: Every day | ORAL | 1 refills | Status: DC

## 2023-02-28 MED ORDER — MECLIZINE HCL 25 MG PO TABS: 25.0000 mg | ORAL_TABLET | Freq: Three times a day (TID) | ORAL | 3 refills | Status: DC | PRN

## 2023-02-28 MED ORDER — BLOOD PRESSURE KIT: 1.0000 [IU] | PACK | Freq: Every day | 0 refills | Status: AC

## 2023-02-28 NOTE — Patient Instructions (Signed)
Medication Instructions:  Your physician has recommended you make the following change in your medication:   -Start Meclizine 25 mg tablets three times daily.   *If you need a refill on your cardiac medications before your next appointment, please call your pharmacy*   Lab Work: None Today If you have labs (blood work) drawn today and your tests are completely normal, you will receive your results only by: MyChart Message (if you have MyChart) OR A paper copy in the mail If you have any lab test that is abnormal or we need to change your treatment, we will call you to review the results.   Testing/Procedures: None   Follow-Up: At [facility], you and your health needs are our priority.  As part of our continuing mission to provide you with exceptional heart care, we have created designated Provider Care Teams.  These Care Teams include your primary Cardiologist (physician) and Advanced Practice Providers (APPs -  Physician Assistants and Nurse Practitioners) who all work together to provide you with the care you need, when you need it.  We recommend signing up for the patient portal called "MyChart".  Sign up information is provided on this After Visit Summary.  MyChart is used to connect with patients for Virtual Visits (Telemedicine).  Patients are able to view lab/test results, encounter notes, upcoming appointments, etc.  Non-urgent messages can be sent to your provider as well.   To learn more about what you can do with MyChart, go to [email or web address].    Your next appointment:   1 month(s) if dizziness improves  6 months if dizziness does not improve  Provider:   Luane School, MD    Other Instructions Call our office on 4/18 and update [place] on your dizziness. This will determine if your Sherryll Burger is increased.

## 2023-02-28 NOTE — Progress Notes (Signed)
Cardiology Office Note  Date: 02/28/2023   ID: [name], DOB 1966-04-26, MRN [id number]  PCP:  Tommie Sams, DO  Cardiologist:  Marjo Bicker, MD Electrophysiologist:  Maurice Small, MD   Reason for Office Visit: NICM follow-up   History of Present Illness: [name] is a 57 y.o. female known to have NICM LVEF 34% with no device, mild CAD, history of breast cancer s/p lumpectomy, chemo/XRT, HLD presented to the cardiology clinic for follow-up visit.  Patient is currently on GDMT but unable to titrate up due to symptoms of daily dizziness.  When asked about dizziness, she reported having room spinning sensation. She also mentioned that her dizziness started around 2 years ago when she first had COVID shots and worsened since her sister passed away in 2022/06/06. She denies any symptoms of chest pain, DOE (has baseline SOB but no recent worsening), syncope, palpitations or leg swelling. Whenever she feels dizzy at home, she does not check her blood pressure because she does not have any blood pressure kit with her.  Past Medical History:  Diagnosis Date   Anxiety    Arthritis    Blood transfusion without reported diagnosis 1999   due to heavy menses   Breast cancer 05/22/2011   03/01/11, Stage 2, s/p lumpectomy, chemo/xrt   Complication of anesthesia    pt woke up during procedure   DDD (degenerative disc disease), lumbar    Depression 06/22/2011   Diverticulitis    DM (diabetes mellitus) 06/22/2011   Genital warts    GERD (gastroesophageal reflux disease) 06/22/2011   Hx of adenomatous colonic polyps 10/2007   36mm sigmoid tubular adenoma, FH colon cancer, mother in mid-50s   Hypercholesterolemia 06/22/2011   Hypertension 06/22/2011   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   Iron deficiency anemia 03/25/2015   LBBB (left bundle branch block)    Mild CAD    Morbid obesity    NICM (nonischemic cardiomyopathy)    PSVT (paroxysmal  supraventricular tachycardia)    Shortness of breath    STD (sexually transmitted disease)    HPV, 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   Syncope    Syncope    Vaginal bleeding 03/08/2014    Past Surgical History:  Procedure Laterality Date   back surg x2     BALLOON DILATION N/A 09/22/2021   Procedure: BALLOON DILATION;  Surgeon: Lanelle Bal, DO;  [facility];  Service: Endoscopy;  Laterality: N/A;   BIOPSY  09/22/2021   Procedure: BIOPSY;  Surgeon: Lanelle Bal, DO;  [facility];  Service: Endoscopy;;   BREAST LUMPECTOMY Right 2012   CHOLECYSTECTOMY     COLONOSCOPY  11/03/07   4-mm sessile polyp removed/small internal hemorrhoids/tubular adenoma, random colon bx negative for microscopic colitis   COLONOSCOPY WITH PROPOFOL N/A 09/17/2016   Grade 2 hemorrhoids, colonic diverticulosis, ascending colon polyp (sessile serrated adenoma), 5 year surveillance   COLONOSCOPY WITH PROPOFOL N/A 09/22/2021   Procedure: COLONOSCOPY WITH PROPOFOL;  Surgeon: Lanelle Bal, DO;  [facility];  Service: Endoscopy;  Laterality: N/A;  7:30am   COLONOSCOPY, ESOPHAGOGASTRODUODENOSCOPY (EGD) AND ESOPHAGEAL DILATION  01/2012   mild gastritis s/p biopsy. Empiric dilation. Normal duodenum.    DILATATION & CURETTAGE/HYSTEROSCOPY WITH MYOSURE N/A 02/10/2015   Procedure: DILATATION & CURETTAGE/HYSTEROSCOPY WITH MYOSURE;  Surgeon: Patton Salles, MD;  Location: [facility];  Service: Gynecology;  Laterality: N/A;   ESOPHAGOGASTRODUODENOSCOPY (EGD) WITH PROPOFOL N/A  01/11/2020   Normal esophagus, s/p dilation, normal stomach, normal duodenum.    ESOPHAGOGASTRODUODENOSCOPY (EGD) WITH PROPOFOL N/A 09/22/2021   Procedure: ESOPHAGOGASTRODUODENOSCOPY (EGD) WITH PROPOFOL;  Surgeon: Lanelle Bal, DO;  [facility];  Service: Endoscopy;  Laterality: N/A;   MALONEY DILATION N/A 01/11/2020   Procedure: Elease Hashimoto DILATION;  Surgeon: Corbin Ade, MD;  [facility];  Service: Endoscopy;  Laterality: N/A;   MM BREAST STEREO BX*L*R/S     rt.   POLYPECTOMY  09/17/2016   Procedure: POLYPECTOMY;  Surgeon: Corbin Ade, MD;  [facility];  Service: Endoscopy;;  ascending colon   POLYPECTOMY  09/22/2021   Procedure: POLYPECTOMY;  Surgeon: Lanelle Bal, DO;  [facility];  Service: Endoscopy;;   PORT-A-CATH REMOVAL  05/26/2012   Procedure: REMOVAL PORT-A-CATH;  Surgeon: [name], MD;  Location: [facility];  Service: General;  Laterality: N/A;  Minor Room   PORTACATH PLACEMENT      Current Outpatient Medications  Medication Sig Dispense Refill   atorvastatin (LIPITOR) 40 MG tablet TAKE 1 TABLET BY MOUTH EVERYDAY AT BEDTIME 30 tablet 2   blood glucose meter kit and supplies Dispense based on patient and insurance preference. Use up to four times daily as directed. (FOR ICD-10 E10.9, E11.9). 1 each 5   cetirizine (ZYRTEC) 10 MG tablet TAKE 1 TABLET BY MOUTH EVERY DAY 90 tablet 1   citalopram (CELEXA) 40 MG tablet TAKE 1 TABLET BY MOUTH EVERY DAY 90 tablet 0   Dulaglutide (TRULICITY) 0.75 MG/0.5ML SOPN INJECT 0.75 MG SUBCUTANEOUSLY ONCE A WEEK 6 mL 1   DULoxetine (CYMBALTA) 30 MG capsule Take 1 capsule (30 mg total) by mouth daily. 90 capsule 1   empagliflozin (JARDIANCE) 10 MG TABS tablet Take 1 tablet (10 mg total) by mouth daily before breakfast. 90 tablet 3   ezetimibe (ZETIA) 10 MG tablet Take 1 tablet (10 mg total) by mouth daily. 30 tablet 3   fluticasone (FLONASE) 50 MCG/ACT nasal spray USE 2 SPRAY(S) IN EACH NOSTRIL ONCE DAILY AS NEEDED 16 mL 5   HYDROcodone-acetaminophen (NORCO/VICODIN) 5-325 MG tablet Take 1 tablet by mouth every 6 (six) hours as needed for severe pain. Due for refill 01/03/23 120 tablet 0   lidocaine (XYLOCAINE) 5 % ointment Apply 1 Application topically 4 (four) times daily as needed for mild pain (back or hands). 50 g 5   metFORMIN (GLUCOPHAGE-XR) 750 MG 24 hr tablet TAKE 1 TABLET BY MOUTH EVERY  DAY WITH BREAKFAST 30 tablet 2   metoprolol succinate (TOPROL XL) 50 MG 24 hr tablet Take 1 tablet (50 mg total) by mouth daily. 30 tablet 11   Multiple Vitamin (MULTIVITAMIN) capsule Take 1 capsule by mouth daily.     Omega-3 Fatty Acids (FISH OIL) 1000 MG CAPS Take 1,000 mg by mouth daily.     ondansetron (ZOFRAN-ODT) 8 MG disintegrating tablet DISSOLVE 1 TABLET IN MOUTH EVERY 8 HOURS AS NEEDED FOR NAUSEA OR VOMITING. 20 tablet 3   pantoprazole (PROTONIX) 40 MG tablet TAKE 1 TABLET (40 MG TOTAL) BY MOUTH TWICE A DAY BEFORE MEALS 180 tablet 1   sacubitril-valsartan (ENTRESTO) 24-26 MG Take 1 tablet by mouth 2 (two) times daily. 60 tablet 11   Vitamin D, Ergocalciferol, (DRISDOL) 1.25 MG (50000 UNIT) CAPS capsule Take 1 capsule (50,000 Units total) by mouth once a week. 12 capsule 3   No current facility-administered medications for this visit.   Allergies:  Flagyl [metronidazole], Lansoprazole, and  Pravastatin   Social History: The patient  reports that she has never smoked. She has never used smokeless tobacco. She reports that she does not drink alcohol and does not use drugs.   Family History: The patient's family history includes Bipolar disorder in her sister; Cancer in her mother; Colon cancer in her mother; Coronary artery disease in her brother, father, and mother; Diabetes in her father, maternal grandfather, and paternal grandmother; Diabetes type I in her father and mother; Heart disease in her paternal grandfather; Hyperlipidemia in her maternal grandmother; Hypertension in her brother, father, maternal grandfather, and maternal grandmother; Peripheral vascular disease in her mother; Stroke in her maternal grandmother.   ROS:  Please see the history of present illness. Otherwise, complete review of systems is positive for none.  All other systems are reviewed and negative.   Physical Exam: VS:  BP 118/78   Pulse 84   Ht  (1.626 m)   Wt 233 lb (105.7 kg)   LMP 04/05/2016  Comment: spotting 11/2016 and 12/2016  SpO2 98%   BMI 39.99 kg/m , BMI Body mass index is 39.99 kg/m.  Wt Readings from Last 3 Encounters:  02/28/23 233 lb (105.7 kg)  11/13/22 236 lb (107 kg)  09/07/22 241 lb 3.2 oz (109.4 kg)    General: Patient appears comfortable at rest. HEENT: Conjunctiva and lids normal, oropharynx clear with moist mucosa. Neck: Supple, no elevated JVP or carotid bruits, no thyromegaly. Lungs: Clear to auscultation, nonlabored breathing at rest. Cardiac: Regular rate and rhythm, no S3 or significant systolic murmur, no pericardial rub. Abdomen: Soft, nontender, no hepatomegaly, bowel sounds present, no guarding or rebound. Extremities: No pitting edema, distal pulses 2+. Skin: Warm and dry. Musculoskeletal: No kyphosis. Neuropsychiatric: Alert and oriented x3, affect grossly appropriate.  ECG:  NSR, LBBB; QRS 145 msec in 04/2022  Recent Labwork: 08/23/2022: ALT 16; AST 16; Hemoglobin 13.2; Magnesium 2.0; Platelets 230; TSH 2.330 09/06/2022: BUN 8; Creatinine, Ser 0.93; Potassium 4.1; Sodium 138     Component Value Date/Time   CHOL 158 10/19/2021 0820   TRIG 147 10/19/2021 0820   HDL 44 10/19/2021 0820   CHOLHDL 3.6 10/19/2021 0820   CHOLHDL 4.4 10/20/2018 0749   VLDL 36 06/04/2014 0919   LDLCALC 88 10/19/2021 0820   LDLCALC 98 10/20/2018 0749    Other Studies Reviewed Today:   Assessment and Plan: Patient is a 57 year old F known to have NICM LVEF 34% with no device, mild CAD, history of breast cancer s/p lumpectomy, chemo/XRT, HLD presented to the cardiology clinic for follow-up visit.  # NICM LVEF 34% with no device -Not on Lasix -Continue metoprolol succinate 50 mg once daily -Continue Entresto 24-26 mg twice daily (patient is instructed to call the clinic and if her dizziness completely resolves with meclizine, will need to increase the dose of Entresto to 49-51 mg twice daily and obtain BMP in 5 days) -Continue Jardiance 10 mg once  daily -EKG shows LBBB, QRS more than 150 ms  # Dizziness likely secondary to vertigo: Start meclizine 25 mg 3 times daily, patient is instructed to call the clinic for an update and improvement in her dizziness symptoms.  Check blood pressure and heart rate at home during dizziness episodes. Prescribed BP kit. Patient is already referred to EP by Fransico Michael, [place] in 10/2022 for evaluation of exertional dizziness to rule out any inducible arrhythmias. If dizziness is not a limiting factor, she will benefit from up titration of GDMT and repeat echocardiogram in  a few months before seeing EP for CRT-D candidacy.  I have spent a total of 30 minutes with patient reviewing chart, EKGs, labs and examining patient as well as establishing an assessment and plan that was discussed with the patient.  > 50% of time was spent in direct patient care.     Medication Adjustments/Labs and Tests Ordered: Current medicines are reviewed at length with the patient today.  Concerns regarding medicines are outlined above.   Tests Ordered: Orders Placed This Encounter  Procedures   EKG 12-Lead    Medication Changes: No orders of the defined types were placed in this encounter.   Disposition:  Follow up  4 months  Signed, Tashima Scarpulla Verne SpurrPriya Tory Septer, MD, 02/28/2023 2:31 PM    [facility] [address], [place], [place] [id number]

## 2023-03-01 LAB — LIPID PANEL
Chol/HDL Ratio: 2.9 ratio (ref 0.0–4.4)
Cholesterol, Total: 144 mg/dL (ref 100–199)
HDL: 49 mg/dL (ref >39)
LDL Chol Calc (NIH): 63 mg/dL (ref 0–99)
Triglycerides: 193 mg/dL — ABNORMAL HIGH (ref 0–149)
VLDL Cholesterol Cal: 32 mg/dL (ref 5–40)

## 2023-03-01 LAB — HEMOGLOBIN A1C
Est. average glucose Bld gHb Est-mCnc: 131 mg/dL
Hgb A1c MFr Bld: 6.2 % — ABNORMAL HIGH (ref 4.8–5.6)

## 2023-03-04 ENCOUNTER — Telehealth: Payer: Self-pay

## 2023-03-04 MED ORDER — HYDROCODONE-ACETAMINOPHEN 5-325 MG PO TABS: 1.0000 | ORAL_TABLET | Freq: Four times a day (QID) | ORAL | 0 refills | Status: DC | PRN

## 2023-03-04 NOTE — Telephone Encounter (Signed)
Patient requesting refill on hydrocodone last fill per pmp   Filled  Written  ID  Drug  QTY  Days  Prescriber  RX #  Dispenser  Refill  Daily Dose*  Pymt Type  PMP  02/01/2023 01/02/2023 1  Hydrocodone-Acetamin 5-325 Mg 120.00 30 Me Lov [id number] Nor ([id number]) 0/0 20.00 MME Medicaid [place]

## 2023-03-05 ENCOUNTER — Ambulatory Visit: Payer: Medicaid Other | Attending: Cardiovascular Disease | Admitting: Cardiovascular Disease

## 2023-03-05 ENCOUNTER — Telehealth: Payer: Self-pay

## 2023-03-05 ENCOUNTER — Encounter: Payer: Self-pay | Admitting: Cardiovascular Disease

## 2023-03-05 VITALS — BP 127/78 | HR 89 | Ht 64.0 in | Wt 235.6 lb

## 2023-03-05 DIAGNOSIS — I428 Other cardiomyopathies: Secondary | ICD-10-CM

## 2023-03-05 DIAGNOSIS — I447 Left bundle-branch block, unspecified: Secondary | ICD-10-CM

## 2023-03-05 DIAGNOSIS — I5020 Unspecified systolic (congestive) heart failure: Secondary | ICD-10-CM

## 2023-03-05 NOTE — Progress Notes (Signed)
Electrophysiology Office Note:    Date:  03/05/2023   ID:  [name], DOB [birth year], MRN [id number]  PCP:  Tommie Sams, DO   [facility] Providers Cardiologist:  Marjo Bicker, MD Electrophysiologist:  Maurice Small, MD     Referring MD: Laurann Montana, PA-C   History of Present Illness:    [name] is a 57 y.o. female with a hx listed below, significant for left bundle branch block, nonischemic cardiomyopathy, history of breast cancer s/p lumpectomy cancer referred for arrhythmia management.  Her echocardiogram performed in August 2023 showed depressed EF, 30 to 35%.  GDMT has been titrated up since that time.  She has been on Entresto, metoprolol XL, and Jardiance since October 2023 at the latest.  Cardiac MRI in April 2024 showed EF less than 35%.  Definitive cause for her cardiomyopathy has not been identified.  CT coronary showed mild nonobstructive coronary disease.  She has a left bundle branch block with QRS greater than 150 ms.  She has chronic shortness of breath and fatigue.  She is making efforts to lose weight but is limited in her ability to walk.  She can barely make it to her mailbox before becoming short of breath.  Past Medical History:  Diagnosis Date   Anxiety    Arthritis    Blood transfusion without reported diagnosis 1999   due to heavy menses   Breast cancer 05/22/2011   03/01/11, Stage 2, s/p lumpectomy, chemo/xrt   Complication of anesthesia    pt woke up during procedure   DDD (degenerative disc disease), lumbar    Depression 06/22/2011   Diverticulitis    DM (diabetes mellitus) 06/22/2011   Genital warts    GERD (gastroesophageal reflux disease) 06/22/2011   Hx of adenomatous colonic polyps 10/2007   4mm sigmoid tubular adenoma, FH colon cancer, mother in mid-50s   Hypercholesterolemia 06/22/2011   Hypertension 06/22/2011   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   Iron deficiency anemia 03/25/2015    LBBB (left bundle branch block)    Mild CAD    Morbid obesity    NICM (nonischemic cardiomyopathy)    PSVT (paroxysmal supraventricular tachycardia)    Shortness of breath    STD (sexually transmitted disease)    HPV, 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   Syncope    Syncope    Vaginal bleeding 03/08/2014    Past Surgical History:  Procedure Laterality Date   back surg x2     BALLOON DILATION N/A 09/22/2021   Procedure: BALLOON DILATION;  Surgeon: Lanelle Bal, DO;  [facility];  Service: Endoscopy;  Laterality: N/A;   BIOPSY  09/22/2021   Procedure: BIOPSY;  Surgeon: Lanelle Bal, DO;  [facility];  Service: Endoscopy;;   BREAST LUMPECTOMY Right 2012   CHOLECYSTECTOMY     COLONOSCOPY  11/03/07   4-mm sessile polyp removed/small internal hemorrhoids/tubular adenoma, random colon bx negative for microscopic colitis   COLONOSCOPY WITH PROPOFOL N/A 09/17/2016   Grade 2 hemorrhoids, colonic diverticulosis, ascending colon polyp (sessile serrated adenoma), 5 year surveillance   COLONOSCOPY WITH PROPOFOL N/A 09/22/2021   Procedure: COLONOSCOPY WITH PROPOFOL;  Surgeon: Lanelle Bal, DO;  [facility];  Service: Endoscopy;  Laterality: N/A;  7:30am   COLONOSCOPY, ESOPHAGOGASTRODUODENOSCOPY (EGD) AND ESOPHAGEAL DILATION  01/2012   mild gastritis s/p biopsy. Empiric dilation. Normal duodenum.    DILATATION & CURETTAGE/HYSTEROSCOPY WITH MYOSURE N/A 02/10/2015  Procedure: DILATATION & CURETTAGE/HYSTEROSCOPY WITH MYOSURE;  Surgeon: Patton Salles, MD;  Location: [facility];  Service: Gynecology;  Laterality: N/A;   ESOPHAGOGASTRODUODENOSCOPY (EGD) WITH PROPOFOL N/A 01/11/2020   Normal esophagus, s/p dilation, normal stomach, normal duodenum.    ESOPHAGOGASTRODUODENOSCOPY (EGD) WITH PROPOFOL N/A 09/22/2021   Procedure: ESOPHAGOGASTRODUODENOSCOPY (EGD) WITH PROPOFOL;  Surgeon: Lanelle Bal, DO;  [facility];  Service: Endoscopy;   Laterality: N/A;   MALONEY DILATION N/A 01/11/2020   Procedure: Elease Hashimoto DILATION;  Surgeon: Corbin Ade, MD;  [facility];  Service: Endoscopy;  Laterality: N/A;   MM BREAST STEREO BX*L*R/S     rt.   POLYPECTOMY  09/17/2016   Procedure: POLYPECTOMY;  Surgeon: Corbin Ade, MD;  [facility];  Service: Endoscopy;;  ascending colon   POLYPECTOMY  09/22/2021   Procedure: POLYPECTOMY;  Surgeon: Lanelle Bal, DO;  [facility];  Service: Endoscopy;;   PORT-A-CATH REMOVAL  05/26/2012   Procedure: REMOVAL PORT-A-CATH;  Surgeon: [name], MD;  Location: [facility];  Service: General;  Laterality: N/A;  Minor Room   PORTACATH PLACEMENT      Current Medications: Current Meds  Medication Sig   atorvastatin (LIPITOR) 40 MG tablet TAKE 1 TABLET BY MOUTH EVERYDAY AT BEDTIME   blood glucose meter kit and supplies Dispense based on patient and insurance preference. Use up to four times daily as directed. (FOR ICD-10 E10.9, E11.9).   Blood Pressure KIT 1 Units by Does not apply route daily.   cetirizine (ZYRTEC) 10 MG tablet TAKE 1 TABLET BY MOUTH EVERY DAY   citalopram (CELEXA) 40 MG tablet TAKE 1 TABLET BY MOUTH EVERY DAY   Dulaglutide (TRULICITY) 0.75 MG/0.5ML SOPN INJECT 0.75 MG SUBCUTANEOUSLY ONCE A WEEK   DULoxetine (CYMBALTA) 30 MG capsule Take 1 capsule (30 mg total) by mouth daily.   empagliflozin (JARDIANCE) 10 MG TABS tablet Take 1 tablet (10 mg total) by mouth daily before breakfast.   fluticasone (FLONASE) 50 MCG/ACT nasal spray USE 2 SPRAY(S) IN EACH NOSTRIL ONCE DAILY AS NEEDED   HYDROcodone-acetaminophen (NORCO/VICODIN) 5-325 MG tablet Take 1 tablet by mouth every 6 (six) hours as needed for severe pain. Due for refill 01/03/23   lidocaine (XYLOCAINE) 5 % ointment Apply 1 Application topically 4 (four) times daily as needed for mild pain (back or hands).   metFORMIN (GLUCOPHAGE-XR) 750 MG 24 hr tablet TAKE 1 TABLET BY MOUTH EVERY DAY WITH BREAKFAST    metoprolol succinate (TOPROL XL) 50 MG 24 hr tablet Take 1 tablet (50 mg total) by mouth daily.   Multiple Vitamin (MULTIVITAMIN) capsule Take 1 capsule by mouth daily.   Omega-3 Fatty Acids (FISH OIL) 1000 MG CAPS Take 1,000 mg by mouth daily.   ondansetron (ZOFRAN-ODT) 8 MG disintegrating tablet DISSOLVE 1 TABLET IN MOUTH EVERY 8 HOURS AS NEEDED FOR NAUSEA OR VOMITING.   pantoprazole (PROTONIX) 40 MG tablet TAKE 1 TABLET (40 MG TOTAL) BY MOUTH TWICE A DAY BEFORE MEALS   sacubitril-valsartan (ENTRESTO) 24-26 MG Take 1 tablet by mouth 2 (two) times daily.   Vitamin D, Ergocalciferol, (DRISDOL) 1.25 MG (50000 UNIT) CAPS capsule Take 1 capsule (50,000 Units total) by mouth once a week.     Allergies:   Flagyl [metronidazole], Lansoprazole, and Pravastatin   Social and Family History: Reviewed in Epic  ROS:   Please see the history of present illness.    All other systems reviewed and are negative.  EKGs/Labs/Other Studies  Reviewed Today:    Echocardiogram:  07/10/22 TTE EF 30-35%   Monitors:  08/10/2022 Sinus rhythm HR 56-146, avg 98. 9 episodes of SVT occurred, longest 45 seconds. Overall burden of ectopy was < 1% There were approximately 35 pages of patient-triggered events showing sinus rhythm and occasional ectopy.  Stress testing:   Advanced imaging:  CMR 02/26/2023 Mild LVE with global hypokinesis, EF 34%. No delayed gadolinium uptake.  Estimated cardiac output 4.7 L/min.  Coronary CT 08/01/2022 1. Coronary calcium score of 141. This was 60 percentile for age-, sex, and race-matched controls. 2. Normal coronary origin with right dominance. 3. Mild CAD as outlined above. 4. Aortic atherosclerosis   EKG:  Last EKG results: 02/28/23 - sinus rhythm LBBB   Recent Labs: 08/23/2022: ALT 16; Hemoglobin 13.2; Magnesium 2.0; Platelets 230; TSH 2.330 09/06/2022: BUN 8; Creatinine, Ser 0.93; Potassium 4.1; Sodium 138     Physical Exam:    VS:  BP 127/78   Pulse 89    Ht 5\' 4"  (1.626 m)   Wt 235 lb 9.6 oz (106.9 kg)   LMP 04/05/2016 Comment: spotting 11/2016 and 12/2016  SpO2 95%   BMI 40.44 kg/m     Wt Readings from Last 3 Encounters:  03/05/23 235 lb 9.6 oz (106.9 kg)  02/28/23 233 lb (105.7 kg)  11/13/22 236 lb (107 kg)     GEN:  Well nourished, well developed in no acute distress, obese CARDIAC: RRR, no murmurs, rubs, gallops RESPIRATORY:  Normal work of breathing MUSCULOSKELETAL: no edema    ASSESSMENT & PLAN:    Non-ischemic cardiomyopathy Etiology unclear EF remains less than 35% despite greater than 3 months of GDMT She meets criteria for CRT-D  I discussed the indication for the procedure and the logistics, risks, potential benefit, and after care. I specifically explained that risks include but are not limited to infection, bleeding,damage to blood vessels, lung, and the heart -- but risk of prolonged hospitalization, need for surgery, or the event of stroke, heart attack, or death are low but not zero.    LBBB Possibly causing cardiomyopathy QRS greater than 150 ms Will address with a CRT  CHFrEF NYHA II-III Continue Entresto 24-26, Jardiance 10 mg, metoprolol XL 50 mg  PSVT Appears to be subclinical Will monitor with the device            Medication Adjustments/Labs and Tests Ordered: Current medicines are reviewed at length with the patient today.  Concerns regarding medicines are outlined above.  No orders of the defined types were placed in this encounter.  No orders of the defined types were placed in this encounter.    Signed, Maurice Small, MD  03/05/2023 11:17 AM    [facility]

## 2023-03-05 NOTE — Patient Instructions (Signed)
Medication Instructions:  Your physician recommends that you continue on your current medications as directed. Please refer to the Current Medication list given to you today. *If you need a refill on your cardiac medications before your next appointment, please call your pharmacy*   Testing/Procedures: BiV ICD Placement on Tuesday, June 4th  Your physician has recommended that you have a defibrillator inserted. An implantable cardioverter defibrillator (ICD) is a small device that is placed in your chest or, in rare cases, your abdomen. This device uses electrical pulses or shocks to help control life-threatening, irregular heartbeats that could lead the heart to suddenly stop beating (sudden cardiac arrest). Leads are attached to the ICD that goes into your heart. This is done in the hospital and usually requires an overnight stay. Please see the instruction sheet given to you today for more information.    Follow-Up: At [facility], you and your health needs are our priority.  As part of our continuing mission to provide you with exceptional heart care, we have created designated Provider Care Teams.  These Care Teams include your primary Cardiologist (physician) and Advanced Practice Providers (APPs -  Physician Assistants and Nurse Practitioners) who all work together to provide you with the care you need, when you need it.  We recommend signing up for the patient portal called "MyChart".  Sign up information is provided on this After Visit Summary.  MyChart is used to connect with patients for Virtual Visits (Telemedicine).  Patients are able to view lab/test results, encounter notes, upcoming appointments, etc.  Non-urgent messages can be sent to your provider as well.   To learn more about what you can do with MyChart, go to [email or web address].    Your next appointment:   Follow up after ICD placement   Provider:   York Pellant, MD

## 2023-03-05 NOTE — Telephone Encounter (Signed)
Documentation from [facility] concerning Excessive Duration: PPI regarding the pt using Pantoprazole. This medication is on the pt's formulary and so is Omeprazole and Esomeprazole and Bentyl (which she trialed/failed).

## 2023-03-07 ENCOUNTER — Telehealth: Payer: Self-pay | Admitting: Internal Medicine

## 2023-03-07 NOTE — Telephone Encounter (Signed)
Spoke with pt who states that she stopped taking Zetia and is now has SOB and dizziness and feeling like she was going to pass out that was worse this morning. Pt states that she stopped taking Meclizine. Pt encourged to be seen in the ER for SOB and Dizziness and feeling like she was going to pass out.

## 2023-03-07 NOTE — Telephone Encounter (Signed)
Patient had a appointment with Mallipeddi last week she was told to discontinue the Zetia she was taking. Today she is feeling odd, has SOB, and thought she would pass out. She would like to be triaged. She saw Dr. Hyacinth Meeker yesterday she is schedule for a pacemaker install in June. She didn't have the symptoms yesterday.  Please Advise.

## 2023-03-08 NOTE — Telephone Encounter (Signed)
Patient notified and verbalized understanding. 

## 2023-03-12 ENCOUNTER — Ambulatory Visit (INDEPENDENT_AMBULATORY_CARE_PROVIDER_SITE_OTHER): Payer: Medicaid Other | Admitting: Obstetrics and Gynecology

## 2023-03-12 ENCOUNTER — Other Ambulatory Visit (HOSPITAL_COMMUNITY)
Admission: RE | Admit: 2023-03-12 | Discharge: 2023-03-12 | Disposition: A | Discharge status: Home / Self Care | Payer: Medicaid Other | Source: Ambulatory Visit | Attending: Obstetrics and Gynecology | Admitting: Obstetrics and Gynecology

## 2023-03-12 ENCOUNTER — Encounter: Payer: Self-pay | Admitting: Obstetrics and Gynecology

## 2023-03-12 VITALS — BP 128/80 | HR 80 | Ht 63.0 in | Wt 234.0 lb

## 2023-03-12 DIAGNOSIS — Z01419 Encounter for gynecological examination (general) (routine) without abnormal findings: Secondary | ICD-10-CM | POA: Diagnosis not present

## 2023-03-12 DIAGNOSIS — N898 Other specified noninflammatory disorders of vagina: Secondary | ICD-10-CM

## 2023-03-12 MED ORDER — NYSTATIN 100000 UNIT/GM EX POWD: 1.0000 | Freq: Three times a day (TID) | CUTANEOUS | 2 refills | Status: DC

## 2023-03-12 NOTE — Patient Instructions (Signed)

## 2023-03-14 ENCOUNTER — Other Ambulatory Visit: Payer: Self-pay | Admitting: *Deleted

## 2023-03-14 ENCOUNTER — Ambulatory Visit (INDEPENDENT_AMBULATORY_CARE_PROVIDER_SITE_OTHER): Payer: Medicaid Other | Admitting: Family Medicine

## 2023-03-14 VITALS — BP 112/72 | HR 83 | Temp 96.8°F | Ht 63.0 in | Wt 234.0 lb

## 2023-03-14 DIAGNOSIS — E782 Mixed hyperlipidemia: Secondary | ICD-10-CM

## 2023-03-14 DIAGNOSIS — K219 Gastro-esophageal reflux disease without esophagitis: Secondary | ICD-10-CM

## 2023-03-14 DIAGNOSIS — I10 Essential (primary) hypertension: Secondary | ICD-10-CM | POA: Diagnosis not present

## 2023-03-14 DIAGNOSIS — Z7984 Long term (current) use of oral hypoglycemic drugs: Secondary | ICD-10-CM

## 2023-03-14 DIAGNOSIS — N760 Acute vaginitis: Secondary | ICD-10-CM

## 2023-03-14 DIAGNOSIS — E119 Type 2 diabetes mellitus without complications: Secondary | ICD-10-CM

## 2023-03-14 DIAGNOSIS — I428 Other cardiomyopathies: Secondary | ICD-10-CM | POA: Diagnosis not present

## 2023-03-14 DIAGNOSIS — B9689 Other specified bacterial agents as the cause of diseases classified elsewhere: Secondary | ICD-10-CM

## 2023-03-14 LAB — CERVICOVAGINAL ANCILLARY ONLY
Bacterial Vaginitis (gardnerella): POSITIVE — AB
Candida Glabrata: NEGATIVE
Candida Vaginitis: NEGATIVE
Comment: NEGATIVE
Comment: NEGATIVE
Comment: NEGATIVE
Comment: NEGATIVE
Trichomonas: NEGATIVE

## 2023-03-14 MED ORDER — CLINDAMYCIN PHOSPHATE 2 % VA CREA
1.0000 | TOPICAL_CREAM | Freq: Every day | VAGINAL | 0 refills | Status: DC
Start: 2023-03-14 — End: 2023-03-19

## 2023-03-14 NOTE — Patient Instructions (Signed)
Continue your current medications.  Follow up in 3-6 months.

## 2023-03-15 ENCOUNTER — Other Ambulatory Visit: Payer: Self-pay

## 2023-03-15 ENCOUNTER — Encounter: Payer: Self-pay | Admitting: Physical Medicine and Rehabilitation

## 2023-03-15 ENCOUNTER — Encounter
Payer: Medicaid Other | Attending: Physical Medicine and Rehabilitation | Admitting: Physical Medicine and Rehabilitation

## 2023-03-15 ENCOUNTER — Telehealth: Payer: Self-pay

## 2023-03-15 VITALS — BP 97/66 | HR 79 | Ht 63.0 in | Wt 236.4 lb

## 2023-03-15 DIAGNOSIS — G894 Chronic pain syndrome: Secondary | ICD-10-CM

## 2023-03-15 DIAGNOSIS — Z79891 Long term (current) use of opiate analgesic: Secondary | ICD-10-CM

## 2023-03-15 DIAGNOSIS — M5416 Radiculopathy, lumbar region: Secondary | ICD-10-CM | POA: Diagnosis not present

## 2023-03-15 DIAGNOSIS — I428 Other cardiomyopathies: Secondary | ICD-10-CM

## 2023-03-15 DIAGNOSIS — Z01810 Encounter for preprocedural cardiovascular examination: Secondary | ICD-10-CM

## 2023-03-15 DIAGNOSIS — Z5181 Encounter for therapeutic drug level monitoring: Secondary | ICD-10-CM | POA: Diagnosis not present

## 2023-03-15 MED ORDER — HYDROCODONE-ACETAMINOPHEN 5-325 MG PO TABS: 1.0000 | ORAL_TABLET | Freq: Four times a day (QID) | ORAL | 0 refills | Status: DC | PRN

## 2023-03-15 NOTE — Assessment & Plan Note (Signed)
Well controlled. Continue current medications  

## 2023-03-15 NOTE — Telephone Encounter (Signed)
PA started for Cleocin in Cover  My Meds

## 2023-03-15 NOTE — Progress Notes (Signed)
Subjective:  Patient ID: [name], female    DOB: [birth year]  Age: 57 y.o. MRN: [id number]  CC: Chief Complaint  Patient presents with   Heart Problem    Saw cardiology and has been advised procedure is needed   Hot Flashes    recommendations    HPI:  57 year old female with an extensive PMH presents for follow up.  Patient's major issue currently is NICM/HFrEF. EF has not improved with medical therapy. Plan is for CRT-D. She is nervous/scared about the procedure. Continues to have SOB which is limiting activity.  Lipids well controlled on Lipitor.  HTN well controlled on Metoprolol and Entresto.   DM-2 @ goal. Recent A1C 6.2. Doing well on Trulicity, Jardiance, Metformin.  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   Fatty liver 09/06/2016   H/O adenomatous polyp of colon 01/24/2012   FH: colon cancer 01/24/2012   GERD (gastroesophageal reflux disease) 06/22/2011   Hyperlipidemia 06/22/2011   Essential hypertension 06/22/2011   Obesity 06/22/2011   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    Social Hx   Social History   Socioeconomic History   Marital status: Married    Spouse name: Not on file   Number of children: 3   Years of education: Not on file   Highest education level: Some college, no degree  Occupational History   Occupation: child care    Employer: [employer]  Tobacco Use   Smoking status: Never   Smokeless tobacco: Never  Vaping Use   Vaping Use: Never used   Substance and Sexual Activity   Alcohol use: No    Alcohol/week: 0.0 standard drinks of alcohol   Drug use: No   Sexual activity: Yes    Partners: Male    Birth control/protection: Post-menopausal  Other Topics Concern   Not on file  Social History Narrative   Not on file   Social Determinants of Health   Financial Resource Strain: Medium Risk (03/11/2023)   Overall Financial Resource Strain (CARDIA)    Difficulty of Paying Living Expenses: Somewhat hard  Food Insecurity: Food Insecurity Present (03/11/2023)   Hunger Vital Sign    Worried About Running Out of Food in the Last Year: Sometimes true    Ran Out of Food in the Last Year: Sometimes true  Transportation Needs: No Transportation Needs (03/11/2023)   PRAPARE - Administrator, Civil Service (Medical): No    Lack of Transportation (Non-Medical): No  Physical Activity: Unknown (03/11/2023)   Exercise Vital Sign    Days of Exercise per Week: Patient declined    Minutes of Exercise per Session: Not on file  Stress: Stress Concern Present (03/11/2023)   [employer] of Occupational Health - Occupational Stress Questionnaire    Feeling of Stress : To some extent  Social Connections: Moderately Isolated (03/11/2023)   Social Connection and Isolation Panel [NHANES]    Frequency of Communication with Friends and Family: More than three times a week    Frequency of Social Gatherings with Friends and Family: Twice  a week    Attends Religious Services: Never    Active Member of Clubs or Organizations: No    Attends Engineer, structural: Not on file    Marital Status: Married    Review of Systems  Constitutional:        Hot flashes.  Psychiatric/Behavioral:  The patient is nervous/anxious.      Objective:  BP 112/72   Pulse 83   Temp (!) 96.8 F (36 C)   Ht 5\' 3"  (1.6 m)   Wt 234 lb (106.1 kg)   LMP 04/05/2016 Comment: spotting 11/2016 and 12/2016  SpO2 97%   BMI 41.45 kg/m      03/15/2023    12:43 PM 03/14/2023    2:54 PM 03/12/2023    3:50 PM  BP/Weight  Systolic BP 97 112 128  Diastolic BP 66 72 80  Wt. (Lbs) 236.4 234 234  BMI 41.88 kg/m2 41.45 kg/m2 41.45 kg/m2    Physical Exam Vitals and nursing note reviewed.  Constitutional:      Appearance: Normal appearance. She is obese.  HENT:     Head: Normocephalic and atraumatic.  Cardiovascular:     Rate and Rhythm: Normal rate and regular rhythm.  Pulmonary:     Effort: Pulmonary effort is normal.     Breath sounds: Normal breath sounds. No wheezing, rhonchi or rales.  Neurological:     Mental Status: She is alert.  Psychiatric:        Mood and Affect: Mood normal.        Behavior: Behavior normal.     Lab Results  Component Value Date   WBC 6.5 08/23/2022   HGB 13.2 08/23/2022   HCT 40.4 08/23/2022   PLT 230 08/23/2022   GLUCOSE 90 09/06/2022   CHOL 144 02/28/2023   TRIG 193 (H) 02/28/2023   HDL 49 02/28/2023   LDLCALC 63 02/28/2023   ALT 16 08/23/2022   AST 16 08/23/2022   NA 138 09/06/2022   K 4.1 09/06/2022   CL 100 09/06/2022   CREATININE 0.93 09/06/2022   BUN 8 09/06/2022   CO2 21 09/06/2022   TSH 2.330 08/23/2022   HGBA1C 6.2 (H) 02/28/2023   MICROALBUR 1.2 10/20/2018     Assessment & Plan:   Problem List Items Addressed This Visit       Cardiovascular and Mediastinum   Essential hypertension    Well controlled. Continue current medications.      NICM (nonischemic cardiomyopathy) (HCC) - Primary    Discussed upcoming procedure and answered questions/concerns. Hopefully this will improved EF.        Endocrine   Diabetes mellitus without complication (HCC)    At goal. Continue current medications.         Other   Hyperlipidemia    At goal. Continue Lipitor.        Follow-up:  3-6 months  [name] Adriana Simas DO [facility]

## 2023-03-15 NOTE — Assessment & Plan Note (Signed)
Discussed upcoming procedure and answered questions/concerns. Hopefully this will improved EF.

## 2023-03-15 NOTE — Assessment & Plan Note (Signed)
At goal. Continue current medications. 

## 2023-03-15 NOTE — Assessment & Plan Note (Signed)
At goal. Continue Lipitor. 

## 2023-03-15 NOTE — Progress Notes (Signed)
Subjective:    Patient ID: [name], female    DOB: Oct 05, 1966, 57 y.o.   MRN: [id number]  HPI  Pt is a 57 yr old female with hx of DM,- A1c 6.1;  Anxiety, R breast invasive ductal cancer of R breast in 2012 neuropathy in hands and feet; and arthritis and pain in legs.  Also hx of lumbar surgery- been on Norco for "years".  Grief reaction and depression over death of sister.     Here for f/u on chronic back pain   Had trigger finger injections done in L hand- but told cannot get again- would need surgery.     Insurance wouldn't pay for MRI. For lumbar spine-  Usually they deny if patients haven't attempted HPI regularly or  PT for trial.    Getting a pacemaker and defibrillator placed- June 4th-  Had appt for May- but doesn't have help getting son back and forth- his graduation, so got appt  moved.   Has heart muscles at 35%- EF- it sounds like.  Doesn't want it done, but knows she needs to do it.   Tried Lidocaine on hands- didn't help that much, even putting on with Saran wrap-  said she "iced it like a cake".   Went to PCP doctor yesterday- Cholesterol is doing OK; and A1c 6.1- "Back acting up-  Loves to walk, but harder to do- can only walk to mailbox- cannot catch breath. And gets real dizzy.      Pain Inventory Average Pain 6 Pain Right Now 4 My pain is sharp and stabbing  In the last 24 hours, has pain interfered with the following? General activity 4 Relation with others 3 Enjoyment of life 2 What TIME of day is your pain at its worst? varies Sleep (in general) NA  Pain is worse with: walking, bending, sitting, and standing Pain improves with: medication and injections Relief from Meds: 6  Family History  Problem Relation Age of Onset   Colon cancer Mother        mid-50s, died of metastatic disease   Cancer Mother        liver   Coronary artery disease Mother    Diabetes type I Mother    Peripheral vascular disease Mother        Carotid  disease in her 104s   Coronary artery disease Father        CAD in his 3s   Diabetes Father    Diabetes type I Father    Hypertension Father    Bipolar disorder Sister    Coronary artery disease Brother        MI in his 85s   Hypertension Brother    Hypertension Maternal Grandmother    Hyperlipidemia Maternal Grandmother    Stroke Maternal Grandmother    Hypertension Maternal Grandfather    Diabetes Maternal Grandfather    Diabetes Paternal Grandmother    Heart disease Paternal Grandfather    Social History   Socioeconomic History   Marital status: Married    Spouse name: Not on file   Number of children: 3   Years of education: Not on file   Highest education level: Some college, no degree  Occupational History   Occupation: child care    Employer: [employer]  Tobacco Use   Smoking status: Never   Smokeless tobacco: Never  Vaping Use   Vaping Use: Never used  Substance and Sexual Activity   Alcohol use: No  Alcohol/week: 0.0 standard drinks of alcohol   Drug use: No   Sexual activity: Yes    Partners: Male    Birth control/protection: Post-menopausal  Other Topics Concern   Not on file  Social History Narrative   Not on file   Social Determinants of Health   Financial Resource Strain: Medium Risk (03/11/2023)   Overall Financial Resource Strain (CARDIA)    Difficulty of Paying Living Expenses: Somewhat hard  Food Insecurity: Food Insecurity Present (03/11/2023)   Hunger Vital Sign    Worried About Running Out of Food in the Last Year: Sometimes true    Ran Out of Food in the Last Year: Sometimes true  Transportation Needs: No Transportation Needs (03/11/2023)   PRAPARE - Administrator, Civil Service (Medical): No    Lack of Transportation (Non-Medical): No  Physical Activity: Unknown (03/11/2023)   Exercise Vital Sign    Days of Exercise per Week: Patient declined    Minutes of Exercise per Session: Not on file  Stress: Stress Concern  Present (03/11/2023)   [employer] of Occupational Health - Occupational Stress Questionnaire    Feeling of Stress : To some extent  Social Connections: Moderately Isolated (03/11/2023)   Social Connection and Isolation Panel [NHANES]    Frequency of Communication with Friends and Family: More than three times a week    Frequency of Social Gatherings with Friends and Family: Twice a week    Attends Religious Services: Never    Database administrator or Organizations: No    Attends Engineer, structural: Not on file    Marital Status: Married   Past Surgical History:  Procedure Laterality Date   back surg x2     BALLOON DILATION N/A 09/22/2021   Procedure: BALLOON DILATION;  Surgeon: Lanelle Bal, DO;  [facility];  Service: Endoscopy;  Laterality: N/A;   BIOPSY  09/22/2021   Procedure: BIOPSY;  Surgeon: Lanelle Bal, DO;  [facility];  Service: Endoscopy;;   BREAST LUMPECTOMY Right 2012   CHOLECYSTECTOMY     COLONOSCOPY  11/03/07   4-mm sessile polyp removed/small internal hemorrhoids/tubular adenoma, random colon bx negative for microscopic colitis   COLONOSCOPY WITH PROPOFOL N/A 09/17/2016   Grade 2 hemorrhoids, colonic diverticulosis, ascending colon polyp (sessile serrated adenoma), 5 year surveillance   COLONOSCOPY WITH PROPOFOL N/A 09/22/2021   Procedure: COLONOSCOPY WITH PROPOFOL;  Surgeon: Lanelle Bal, DO;  [facility];  Service: Endoscopy;  Laterality: N/A;  7:30am   COLONOSCOPY, ESOPHAGOGASTRODUODENOSCOPY (EGD) AND ESOPHAGEAL DILATION  01/2012   mild gastritis s/p biopsy. Empiric dilation. Normal duodenum.    DILATATION & CURETTAGE/HYSTEROSCOPY WITH MYOSURE N/A 02/10/2015   Procedure: DILATATION & CURETTAGE/HYSTEROSCOPY WITH MYOSURE;  Surgeon: Patton Salles, MD;  Location: [facility];  Service: Gynecology;  Laterality: N/A;   ESOPHAGOGASTRODUODENOSCOPY (EGD) WITH PROPOFOL N/A 01/11/2020   Normal esophagus, s/p  dilation, normal stomach, normal duodenum.    ESOPHAGOGASTRODUODENOSCOPY (EGD) WITH PROPOFOL N/A 09/22/2021   Procedure: ESOPHAGOGASTRODUODENOSCOPY (EGD) WITH PROPOFOL;  Surgeon: Lanelle Bal, DO;  [facility];  Service: Endoscopy;  Laterality: N/A;   MALONEY DILATION N/A 01/11/2020   Procedure: Elease Hashimoto DILATION;  Surgeon: Corbin Ade, MD;  [facility];  Service: Endoscopy;  Laterality: N/A;   MM BREAST STEREO BX*L*R/S     rt.   POLYPECTOMY  09/17/2016   Procedure: POLYPECTOMY;  Surgeon: Corbin Ade, MD;  [facility];  Service: Endoscopy;;  ascending colon   POLYPECTOMY  09/22/2021   Procedure: POLYPECTOMY;  Surgeon: Lanelle Bal, DO;  [facility];  Service: Endoscopy;;   PORT-A-CATH REMOVAL  05/26/2012   Procedure: REMOVAL PORT-A-CATH;  Surgeon: [name], MD;  Location: [facility];  Service: General;  Laterality: N/A;  Minor Room   PORTACATH PLACEMENT     Past Surgical History:  Procedure Laterality Date   back surg x2     BALLOON DILATION N/A 09/22/2021   Procedure: BALLOON DILATION;  Surgeon: Lanelle Bal, DO;  [facility];  Service: Endoscopy;  Laterality: N/A;   BIOPSY  09/22/2021   Procedure: BIOPSY;  Surgeon: Lanelle Bal, DO;  [facility];  Service: Endoscopy;;   BREAST LUMPECTOMY Right 2012   CHOLECYSTECTOMY     COLONOSCOPY  11/03/07   4-mm sessile polyp removed/small internal hemorrhoids/tubular adenoma, random colon bx negative for microscopic colitis   COLONOSCOPY WITH PROPOFOL N/A 09/17/2016   Grade 2 hemorrhoids, colonic diverticulosis, ascending colon polyp (sessile serrated adenoma), 5 year surveillance   COLONOSCOPY WITH PROPOFOL N/A 09/22/2021   Procedure: COLONOSCOPY WITH PROPOFOL;  Surgeon: Lanelle Bal, DO;  [facility];  Service: Endoscopy;  Laterality: N/A;  7:30am   COLONOSCOPY, ESOPHAGOGASTRODUODENOSCOPY (EGD) AND ESOPHAGEAL DILATION  01/2012   mild  gastritis s/p biopsy. Empiric dilation. Normal duodenum.    DILATATION & CURETTAGE/HYSTEROSCOPY WITH MYOSURE N/A 02/10/2015   Procedure: DILATATION & CURETTAGE/HYSTEROSCOPY WITH MYOSURE;  Surgeon: Patton Salles, MD;  Location: [facility];  Service: Gynecology;  Laterality: N/A;   ESOPHAGOGASTRODUODENOSCOPY (EGD) WITH PROPOFOL N/A 01/11/2020   Normal esophagus, s/p dilation, normal stomach, normal duodenum.    ESOPHAGOGASTRODUODENOSCOPY (EGD) WITH PROPOFOL N/A 09/22/2021   Procedure: ESOPHAGOGASTRODUODENOSCOPY (EGD) WITH PROPOFOL;  Surgeon: Lanelle Bal, DO;  [facility];  Service: Endoscopy;  Laterality: N/A;   MALONEY DILATION N/A 01/11/2020   Procedure: Elease Hashimoto DILATION;  Surgeon: Corbin Ade, MD;  [facility];  Service: Endoscopy;  Laterality: N/A;   MM BREAST STEREO BX*L*R/S     rt.   POLYPECTOMY  09/17/2016   Procedure: POLYPECTOMY;  Surgeon: Corbin Ade, MD;  [facility];  Service: Endoscopy;;  ascending colon   POLYPECTOMY  09/22/2021   Procedure: POLYPECTOMY;  Surgeon: Lanelle Bal, DO;  [facility];  Service: Endoscopy;;   PORT-A-CATH REMOVAL  05/26/2012   Procedure: REMOVAL PORT-A-CATH;  Surgeon: [name], MD;  Location: [facility];  Service: General;  Laterality: N/A;  Minor Room   PORTACATH PLACEMENT     Past Medical History:  Diagnosis Date   Anxiety    Arthritis    Blood transfusion without reported diagnosis 1999   due to heavy menses   Breast cancer (HCC) 05/22/2011   03/01/11, Stage 2, s/p lumpectomy, chemo/xrt   Complication of anesthesia    pt woke up during procedure   DDD (degenerative disc disease), lumbar    Depression 06/22/2011   Diverticulitis    DM (diabetes mellitus) (HCC) 06/22/2011   Genital warts    GERD (gastroesophageal reflux disease) 06/22/2011   Hx of adenomatous colonic polyps 10/2007   4mm sigmoid tubular adenoma, FH colon cancer, mother in mid-50s   Hypercholesterolemia  06/22/2011   Hypertension 06/22/2011   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   Iron deficiency anemia 03/25/2015   LBBB (left bundle branch block)  Mild CAD    Morbid obesity (HCC)    NICM (nonischemic cardiomyopathy) (HCC)    PSVT (paroxysmal supraventricular tachycardia)    Shortness of breath    STD (sexually transmitted disease)    HPV, 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   Syncope    Syncope    Vaginal bleeding 03/08/2014   BP 97/66   Pulse 79   Ht 5\' 3"  (1.6 m)   Wt 236 lb 6.4 oz (107.2 kg)   LMP 04/05/2016 Comment: spotting 11/2016 and 12/2016  SpO2 95%   BMI 41.88 kg/m      Repeated BP 113/75  Opioid Risk Score:   Fall Risk Score:  `1  Depression screen [facility] 2/9     03/15/2023   12:47 PM 03/14/2023    3:16 PM 12/17/2022   12:54 PM 10/20/2021    3:10 PM 07/14/2021    3:19 PM 05/05/2021    2:37 PM 04/14/2021    1:11 PM  Depression screen PHQ 2/9  Decreased Interest 1 1 0 3 1 3 2   Down, Depressed, Hopeless 1 1 0 3 1 3 2   PHQ - 2 Score 2 2 0 6 2 6 4   Altered sleeping  2    1 2   Tired, decreased energy  2    3 2   Change in appetite  0    1 1  Feeling bad or failure about yourself   0    1 1  Trouble concentrating  2    2 3   Moving slowly or fidgety/restless  0    1 3  Suicidal thoughts  0    1 0  PHQ-9 Score  8    16 16   Difficult doing work/chores  Somewhat difficult     Extremely dIfficult    Review of Systems  Constitutional: Negative.   HENT: Negative.    Eyes: Negative.   Respiratory: Negative.    Cardiovascular: Negative.   Gastrointestinal: Negative.   Endocrine: Negative.   Genitourinary: Negative.   Musculoskeletal:  Positive for back pain.       Arms and legs esp hips  Skin: Negative.   Allergic/Immunologic: Negative.   Neurological: Negative.   Hematological: Negative.   Psychiatric/Behavioral:  Positive for dysphoric mood.   All other systems reviewed and are negative.      Objective:   Physical Exam  Awake, alert,  appropriate, accompanied by husband, NAD Band across low back TTP- with accompanying muscle spasms palpated More ROM of fingers today       Assessment & Plan:   Pt is a 57 yr old female with hx of DM,- A1c 6.2;  Anxiety, R breast invasive ductal cancer of R breast in 2012 neuropathy in hands and feet; and arthritis and pain in legs.  Also hx of lumbar surgery- been on Norco for "years".  Grief reaction and depression over death of sister.      Goal to try and strengthen the muscle sin your back and core-  exercise ball- blow up til knees are at 90 degrees.  Sit on it at least 30 minutes/day- goal to increase to >1 hours/day- work on keeping upright on back.  And then once you're bored and things are easy, then you start to challenge balance by bringing 1 foot off floor, etc.   3. Cannot get MRI due to getting pacer- but not til June, so IF can get MRI then that would be awesome before gets Facilities manager- I suggest calling insurance- and  seeing if can do, since getting Pacemaker.  Let me know if I need to do something different  4. Con't Norco- send in Norco 5/325 mg up to 4x/day as needed #120- and then sent in another for June 2024.   5. Duloxetine 30 mg daily since also on Celexa/Citalopram- sent in 6 months refill on 02/28/23.   6. Went over xray results- all DJD/arthritis, but nothing acute.   7. UDS due today- will get done  8. F/U in 3 months with Riley Lam and 6 months with me- on chronic back pain    I spent a total of 26   minutes on total care today- >50% coordination of care- due to discussion about Riley Lam, pain and options for exercises without tiring her out.

## 2023-03-15 NOTE — Patient Instructions (Signed)
Pt is a 57 yr old female with hx of DM,- A1c 6.2;  Anxiety, R breast invasive ductal cancer of R breast in 2012 neuropathy in hands and feet; and arthritis and pain in legs.  Also hx of lumbar surgery- been on Norco for "years".  Grief reaction and depression over death of sister.      Goal to try and strengthen the muscle sin your back and core-  exercise ball- blow up til knees are at 90 degrees.  Sit on it at least 30 minutes/day- goal to increase to >1 hours/day- work on keeping upright on back.  And then once you're bored and things are easy, then you start to challenge balance by bringing 1 foot off floor, etc.   3. Cannot get MRI due to getting pacer- but not til June, so IF can get MRI then that would be awesome before gets Facilities manager- I suggest calling insurance- and seeing if can do, since getting Pacemaker.  Let me know if I need to do something different  4. Con't Norco- send in Norco 5/325 mg up to 4x/day as needed #120- and then sent in another for June 2024.   5. Duloxetine 30 mg daily since also on Celexa/Citalopram- sent in 6 months refill on 02/28/23.   6. Went over xray results- all DJD/arthritis, but nothing acute.   7. UDS due today- will get done  8. F/U in 3 months with Riley Lam and 6 months with me- on chronic back pain

## 2023-03-18 ENCOUNTER — Other Ambulatory Visit: Payer: Self-pay

## 2023-03-19 ENCOUNTER — Telehealth: Payer: Self-pay

## 2023-03-19 MED ORDER — CLINDESSE 2 % VA CREA: TOPICAL_CREAM | VAGINAL | 0 refills | Status: DC

## 2023-03-19 NOTE — Telephone Encounter (Signed)
Per Cover [employer] and [employer] PA denial: Here is list of preferred alternatives: Cleocin vaginal ovules, Clindesse vaginal cream, Nuvessa vaginal gel(brand preferred for all), metronidazole vaginal gel

## 2023-03-19 NOTE — Telephone Encounter (Signed)
Please inform the patient that I sent a prescription for Clindesse 2% vaginal cream to her pharmacy.  She will place one applicator, 100 mg, in the vagina, once at bedtime.  Disp:  100 mg. RF:  none.

## 2023-03-19 NOTE — Telephone Encounter (Signed)
Original PA and appeal denied for Clindamycin. Original PA and appeal both described pt allergy to Flaygl.

## 2023-03-19 NOTE — Telephone Encounter (Signed)
Spoke to pt regarding new prescription and she voiced understanding.

## 2023-03-20 DIAGNOSIS — Z419 Encounter for procedure for purposes other than remedying health state, unspecified: Secondary | ICD-10-CM | POA: Diagnosis not present

## 2023-03-21 LAB — TOXASSURE SELECT,+ANTIDEPR,UR

## 2023-03-22 ENCOUNTER — Telehealth: Payer: Self-pay

## 2023-03-22 NOTE — Telephone Encounter (Signed)
Spoke with patient, offered to move up device placement but patient declined due to work issues. Patient would like to keep June 4th appointment. No further needs at this time

## 2023-03-24 ENCOUNTER — Other Ambulatory Visit: Payer: Self-pay | Admitting: Family Medicine

## 2023-03-24 DIAGNOSIS — J302 Other seasonal allergic rhinitis: Secondary | ICD-10-CM

## 2023-04-01 ENCOUNTER — Other Ambulatory Visit: Payer: Self-pay | Admitting: Family Medicine

## 2023-04-03 ENCOUNTER — Other Ambulatory Visit: Payer: Self-pay | Admitting: Obstetrics and Gynecology

## 2023-04-03 NOTE — Telephone Encounter (Signed)
Med refill request: nystatin powder Last AEX: 03/12/23 Refill authorized: Please Advise?

## 2023-04-06 ENCOUNTER — Emergency Department (HOSPITAL_COMMUNITY)
Admission: EM | Admit: 2023-04-06 | Discharge: 2023-04-06 | Disposition: A | Discharge status: Home / Self Care | Payer: Medicaid Other | Attending: Emergency Medicine | Admitting: Emergency Medicine

## 2023-04-06 ENCOUNTER — Encounter (HOSPITAL_COMMUNITY): Payer: Self-pay | Admitting: Emergency Medicine

## 2023-04-06 ENCOUNTER — Emergency Department (HOSPITAL_COMMUNITY): Payer: Medicaid Other

## 2023-04-06 ENCOUNTER — Other Ambulatory Visit: Payer: Self-pay

## 2023-04-06 DIAGNOSIS — Z853 Personal history of malignant neoplasm of breast: Secondary | ICD-10-CM | POA: Diagnosis not present

## 2023-04-06 DIAGNOSIS — Z79899 Other long term (current) drug therapy: Secondary | ICD-10-CM | POA: Diagnosis not present

## 2023-04-06 DIAGNOSIS — Z7984 Long term (current) use of oral hypoglycemic drugs: Secondary | ICD-10-CM | POA: Insufficient documentation

## 2023-04-06 DIAGNOSIS — I509 Heart failure, unspecified: Secondary | ICD-10-CM | POA: Insufficient documentation

## 2023-04-06 DIAGNOSIS — E119 Type 2 diabetes mellitus without complications: Secondary | ICD-10-CM | POA: Insufficient documentation

## 2023-04-06 DIAGNOSIS — R0602 Shortness of breath: Secondary | ICD-10-CM

## 2023-04-06 DIAGNOSIS — I110 Hypertensive heart disease with heart failure: Secondary | ICD-10-CM | POA: Insufficient documentation

## 2023-04-06 HISTORY — DX: Heart failure, unspecified: I50.9

## 2023-04-06 LAB — BASIC METABOLIC PANEL
Anion gap: 9 (ref 5–15)
BUN: 12 mg/dL (ref 6–20)
CO2: 26 mmol/L (ref 22–32)
Calcium: 8.7 mg/dL — ABNORMAL LOW (ref 8.9–10.3)
Chloride: 101 mmol/L (ref 98–111)
Creatinine, Ser: 1.01 mg/dL — ABNORMAL HIGH (ref 0.44–1.00)
GFR, Estimated: >60 mL/min (ref >60)
Glucose, Bld: 96 mg/dL (ref 70–99)
Potassium: 3.8 mmol/L (ref 3.5–5.1)
Sodium: 136 mmol/L (ref 135–145)

## 2023-04-06 LAB — CBC WITH DIFFERENTIAL/PLATELET
Abs Immature Granulocytes: 0.03 10*3/uL (ref 0.00–0.07)
Basophils Absolute: 0.1 10*3/uL (ref 0.0–0.1)
Basophils Relative: 1 %
Eosinophils Absolute: 0.2 10*3/uL (ref 0.0–0.5)
Eosinophils Relative: 3 %
HCT: 42 % (ref 36.0–46.0)
Hemoglobin: 13.2 g/dL (ref 12.0–15.0)
Immature Granulocytes: 0 %
Lymphocytes Relative: 39 %
Lymphs Abs: 2.7 10*3/uL (ref 0.7–4.0)
MCH: 27.6 pg (ref 26.0–34.0)
MCHC: 31.4 g/dL (ref 30.0–36.0)
MCV: 87.7 fL (ref 80.0–100.0)
Monocytes Absolute: 0.4 10*3/uL (ref 0.1–1.0)
Monocytes Relative: 6 %
Neutro Abs: 3.5 10*3/uL (ref 1.7–7.7)
Neutrophils Relative %: 51 %
Platelets: 225 10*3/uL (ref 150–400)
RBC: 4.79 MIL/uL (ref 3.87–5.11)
RDW: 16.9 % — ABNORMAL HIGH (ref 11.5–15.5)
WBC: 6.8 10*3/uL (ref 4.0–10.5)
nRBC: 0 % (ref 0.0–0.2)

## 2023-04-06 LAB — TROPONIN I (HIGH SENSITIVITY): Troponin I (High Sensitivity): 4 ng/L (ref <18)

## 2023-04-06 LAB — BRAIN NATRIURETIC PEPTIDE: B Natriuretic Peptide: 22 pg/mL (ref 0.0–100.0)

## 2023-04-06 LAB — D-DIMER, QUANTITATIVE: D-Dimer, Quant: 0.32 ug{FEU}/mL (ref 0.00–0.50)

## 2023-04-06 MED ORDER — FUROSEMIDE 10 MG/ML IJ SOLN
40.0000 mg | Freq: Once | INTRAMUSCULAR | Status: AC
  Administered 2023-04-06: 40 mg via INTRAVENOUS
  Filled 2023-04-06: qty 4

## 2023-04-06 MED ORDER — DOXYCYCLINE HYCLATE 100 MG PO CAPS: 100.0000 mg | ORAL_CAPSULE | Freq: Two times a day (BID) | ORAL | 0 refills | Status: AC

## 2023-04-06 NOTE — ED Provider Notes (Signed)
This patient is a 57 year old female, she is chronically ill with a diagnosis of nonischemic cardiomyopathy and congestive heart failure with an ejection fraction around 30%.  She is scheduled to get a ICD and pacemaker placed in June however she presents today with a complaint of shortness of breath that occurred last night as she was trying to sleep.  She had to sit straight upright with multiple pillows behind her back as she could not lay down without becoming dyspneic.  Throughout the day today she has felt extremely fatigued and had ongoing dyspnea, this is both with exertion as well as orthopnea.  She denies peripheral edema, she has had about a week of feeling a little bit off with a cough and a runny nose as multiple people in the family have had a mild upper respiratory infection.  She is coughing the occasional amount of small clear phlegm, no fevers, no dysuria, no diarrhea, she is nauseated but not vomiting.  Her appetite has been poor.  Review of the medical record shows multiple office visits with cardiology and a scheduled pacemaker placement.  She has not had a heart catheterization, however she has had an MR of her cardiac muscle, this showed that the patient had global hypokinesis and abnormal septal motion, trivial regurg, CT scan of the coronaries from September 2023 showed no significant vascular findings minimal coronary disease.  On exam this patient has clear lungs, no JVD, when I try to lay her down she becomes very dyspneic and wants to sit back up.  There is no edema, she has no murmur, soft abdomen, no edema peripherally.  EKG shows left bundle branch block consistent with prior EKGs  Labs and chest x-ray will be ordered, will likely discuss with cardiology if any concerns for decompensated congestive heart failure, that being said the patient is completely unremarkable in their appearance with normal vital signs Lasix given.  After investigation the patient was informed of her  very normal workup including labs BNP, her chest x-ray, her EKG and her vital signs.  She is agreeable to follow-up in the outpatient setting  Medical screening examination/treatment/procedure(s) were conducted as a shared visit with non-physician practitioner(s) and myself.  I personally evaluated the patient during the encounter.  Clinical Impression:   Final diagnoses:  Shortness of breath         Eber Hong, MD 04/07/23 1458

## 2023-04-06 NOTE — ED Triage Notes (Addendum)
Pt states she has CHF and is also scheduled for AICD placement on June 4th. States family has been sick with a cough and that last night she started coughing "bad" and that most of today has felt like she "couldn't catch her breath". Pt also endorses L sided chest pain that she states is worse with inhalation and exhalation.

## 2023-04-06 NOTE — ED Provider Notes (Signed)
[place] EMERGENCY DEPARTMENT AT [facility] Provider Note   CSN: [id number] Arrival date & time: 04/06/23  2008     History  Chief Complaint  Patient presents with   Shortness of Breath   Chest Pain    [name] is a 57 y.o. female with a history of CHF, diabetes mellitus, and breast cancer presents today for shortness of breath. Patient reports that she was woken up in the middle of the night having to catch her breath. She propped herself up on her pillows and was able to go back to sleep. Today, she reports feeling short of breath at rest and with exertion as well as chest pain that worsens with coughing. Patient reports sick contact at home. No lower extremity edema,abdominal pain, or fever. Echo from 06/2022 reveals EF of 30-35%.     Home Medications Prior to Admission medications   Medication Sig Start Date End Date Taking? Authorizing Provider  atorvastatin (LIPITOR) 40 MG tablet TAKE 1 TABLET BY MOUTH EVERYDAY AT BEDTIME 01/02/23   Cook, Noble G, DO  blood glucose meter kit and supplies Dispense based on patient and insurance preference. Use up to four times daily as directed. (FOR ICD-10 E10.9, E11.9). 01/05/20   Merlyn Albert, MD  Blood Pressure KIT 1 Units by Does not apply route daily. 02/28/23   Mallipeddi, Vishnu P, MD  cetirizine (ZYRTEC) 10 MG tablet TAKE 1 TABLET BY MOUTH EVERY DAY 03/25/23   Everlene Other G, DO  citalopram (CELEXA) 40 MG tablet TAKE 1 TABLET BY MOUTH EVERY DAY 02/25/23   Tommie Sams, DO  Clindamycin Phosphate, 1 Dose, (CLINDESSE) vaginal cream Apply 1 applicator (100 mg) per vagina once at bedtime. 03/19/23   Patton Salles, MD  Dulaglutide (TRULICITY) 0.75 MG/0.5ML SOPN INJECT 0.75 MG SUBCUTANEOUSLY ONCE A WEEK 10/31/22   Everlene Other G, DO  DULoxetine (CYMBALTA) 30 MG capsule Take 1 capsule (30 mg total) by mouth daily. 02/28/23   Lovorn, Aundra Millet, MD  empagliflozin (JARDIANCE) 10 MG TABS tablet Take 1 tablet (10 mg total) by  mouth daily before breakfast. 01/02/23   Jake Bathe, MD  fluticasone (FLONASE) 50 MCG/ACT nasal spray USE 2 SPRAY(S) IN EACH NOSTRIL ONCE DAILY AS NEEDED 01/02/23   Tommie Sams, DO  HYDROcodone-acetaminophen (NORCO/VICODIN) 5-325 MG tablet Take 1 tablet by mouth every 6 (six) hours as needed for severe pain. Due for refill 05/02/23 03/15/23   Lovorn, Aundra Millet, MD  lidocaine (XYLOCAINE) 5 % ointment Apply 1 Application topically 4 (four) times daily as needed for mild pain (back or hands). Patient not taking: Reported on 03/15/2023 12/17/22   Genice Rouge, MD  metFORMIN (GLUCOPHAGE-XR) 750 MG 24 hr tablet TAKE 1 TABLET BY MOUTH EVERY DAY WITH BREAKFAST 01/02/23   Everlene Other G, DO  metoprolol succinate (TOPROL XL) 50 MG 24 hr tablet Take 1 tablet (50 mg total) by mouth daily. 12/04/22   Jake Bathe, MD  Multiple Vitamin (MULTIVITAMIN) capsule Take 1 capsule by mouth daily.    [provider]  nystatin (MYCOSTATIN/NYSTOP) powder APPLY 1 APPLICATION TOPICALLY 3 (THREE) TIMES DAILY. APPLY TO AFFECTED AREA FOR UP TO 7 DAYS 04/03/23   Ardell Isaacs, Forrestine Him, MD  Omega-3 Fatty Acids (FISH OIL) 1000 MG CAPS Take 1,000 mg by mouth daily.    [provider]  ondansetron (ZOFRAN-ODT) 8 MG disintegrating tablet DISSOLVE 1 TABLET IN MOUTH EVERY 8 HOURS AS NEEDED FOR NAUSEA OR VOMITING. 04/04/23  Cook, Jayce G, DO  pantoprazole (PROTONIX) 40 MG tablet TAKE 1 TABLET (40 MG TOTAL) BY MOUTH TWICE A DAY BEFORE MEALS 01/30/23   Gelene Mink, NP  sacubitril-valsartan (ENTRESTO) 24-26 MG Take 1 tablet by mouth 2 (two) times daily. 07/12/22   Jake Bathe, MD  Vitamin D, Ergocalciferol, (DRISDOL) 1.25 MG (50000 UNIT) CAPS capsule Take 1 capsule (50,000 Units total) by mouth once a week. 06/28/22   Doreatha Massed, MD      Allergies    Flagyl [metronidazole], Lansoprazole, and Pravastatin    Review of Systems   Review of Systems  Respiratory:  Positive for shortness of breath.    Cardiovascular:  Positive for chest pain.  All other systems reviewed and are negative.   Physical Exam Updated Vital Signs BP (!) 145/76 (BP Location: Left Arm)   Pulse 80   Temp 98.1 F (36.7 C) (Oral)   Resp 12   LMP 04/05/2016 Comment: spotting 11/2016 and 12/2016  SpO2 98%  Physical Exam Vitals and nursing note reviewed.  Constitutional:      Appearance: Normal appearance. She is well-developed.  HENT:     Head: Normocephalic and atraumatic.     Mouth/Throat:     Mouth: Mucous membranes are moist.     Pharynx: Oropharynx is clear.  Eyes:     Conjunctiva/sclera: Conjunctivae normal.     Pupils: Pupils are equal, round, and reactive to light.  Neck:     Vascular: No JVD.  Cardiovascular:     Rate and Rhythm: Normal rate and regular rhythm.     Pulses: Normal pulses.     Heart sounds: Normal heart sounds.  Pulmonary:     Effort: Pulmonary effort is normal.     Breath sounds: Normal breath sounds.  Abdominal:     Palpations: Abdomen is soft.     Tenderness: There is no abdominal tenderness.  Musculoskeletal:     Right lower leg: No edema.     Left lower leg: No edema.  Skin:    General: Skin is warm and dry.     Capillary Refill: Capillary refill takes less than 2 seconds.     Findings: No rash.  Neurological:     General: No focal deficit present.     Mental Status: She is alert.  Psychiatric:        Mood and Affect: Mood normal.        Behavior: Behavior normal.     ED Results / Procedures / Treatments   Labs (all labs ordered are listed, but only abnormal results are displayed) Labs Reviewed  BASIC METABOLIC PANEL  BRAIN NATRIURETIC PEPTIDE  D-DIMER, QUANTITATIVE  CBC WITH DIFFERENTIAL/PLATELET  TROPONIN I (HIGH SENSITIVITY)    EKG None  Radiology DG Chest Portable 1 View  Result Date: 04/06/2023 CLINICAL DATA:  Shortness of breath. EXAM: PORTABLE CHEST 1 VIEW COMPARISON:  January 26, 2022 FINDINGS: The heart size and mediastinal contours are  within normal limits. Both lungs are clear. Multilevel degenerative changes seen throughout the thoracic spine. IMPRESSION: No active disease. Electronically Signed   By: Aram Candela M.D.   On: 04/06/2023 20:44    Procedures Procedures: not indicated.   Medications Ordered in ED Medications - No data to display  ED Course/ Medical Decision Making/ A&P                             Medical Decision Making Amount and/or Complexity  of Data Reviewed Labs: ordered. Radiology: ordered.   This patient presents to the ED for concern of shortness of breath, this involves an extensive number of treatment options, and is a complaint that carries with it a high risk of complications and morbidity.  The differential diagnosis includes CHF exacerbation, PE,    Co morbidities that complicate the patient evaluation  CHF with EF of 30-35% Right breast cancer Diabetes mellitus Hypertension   Additional history obtained:  Additional history obtained from patient External records from outside source obtained and reviewed including cardiology records   Lab Tests:  I Ordered, and personally interpreted labs - all within normal limits.   Imaging Studies ordered:  I ordered imaging studies including CXR  I independently visualized and interpreted imaging which showed normal chest x-ray with no acute findings I agree with the radiologist interpretation   Cardiac Monitoring: / EKG:  The patient was maintained on a cardiac monitor.  I personally viewed and interpreted the cardiac monitored which showed an underlying rhythm of: sinus rhythm with LBBB   Problem List / ED Course / Critical interventions / Medication management  Shortness of breath I ordered medication including Lasix to diurese patient.   Reevaluation of the patient after these medicines showed that the patient improved I have reviewed the patients home medicines and have made adjustments as needed   Social  Determinants of Health:  Social connections Housing   Test / Admission - Considered:  Patient is hemodynamically stable and safe to be discharged home. Return instructions provided.         Final Clinical Impression(s) / ED Diagnoses Final diagnoses:  None    Rx / DC Orders ED Discharge Orders     None         Maxwell Marion, PA-C 04/06/23 2227    Eber Hong, MD 04/07/23 [phone]

## 2023-04-06 NOTE — ED Notes (Signed)
Pt ambulatory to bathroom with no difficulty or distress noted. °

## 2023-04-06 NOTE — Discharge Instructions (Signed)
Discussed results with patient. Doxycycline will be given as prophylaxis as she is scheduled to get a pacemaker in the next several weeks and she has sick contact at home.   Return to ED if: shortness of breath worsens or becomes persistent or if chest pain persists.

## 2023-04-08 ENCOUNTER — Telehealth: Payer: Self-pay

## 2023-04-08 DIAGNOSIS — H5213 Myopia, bilateral: Secondary | ICD-10-CM | POA: Diagnosis not present

## 2023-04-08 NOTE — Transitions of Care (Post Inpatient/ED Visit) (Signed)
04/08/2023  Name: [name] MRN: [id number] DOB: 07-28-1966  Today's TOC FU Call Status: Today's TOC FU Call Status:: Successful TOC FU Call Competed TOC FU Call Complete Date: 04/08/23  Transition Care Management Follow-up Telephone Call Date of Discharge: 04/06/23 [facility] Type of Discharge: Emergency Department Reason for ED Visit: Other: [facility]) How have you been since you were released from the hospital?: Better Any questions or concerns?: No  Items Reviewed: Did you receive and understand the discharge instructions provided?: Yes Medications obtained,verified, and reconciled?: Yes (Medications Reviewed) Any new allergies since your discharge?: No Dietary orders reviewed?: Yes Do you have support at home?: No  Medications Reviewed Today: Medications Reviewed Today     Reviewed by Karena Addison, LPN (Licensed Practical Nurse) on 04/08/23 at 1544  Med List Status: <None>   Medication Order Taking? Sig Documenting Provider Last Dose Status Informant  atorvastatin (LIPITOR) 40 MG tablet [id number] Yes TAKE 1 TABLET BY MOUTH EVERYDAY AT BEDTIME Everlene Other G, DO Taking Active   blood glucose meter kit and supplies [id number] Yes Dispense based on patient and insurance preference. Use up to four times daily as directed. (FOR ICD-10 E10.9, E11.9). Merlyn Albert, MD Taking Active Self  Blood Pressure KIT [id number] Yes 1 Units by Does not apply route daily. Mallipeddi, Vishnu P, MD Taking Active   cetirizine (ZYRTEC) 10 MG tablet [id number] Yes TAKE 1 TABLET BY MOUTH EVERY DAY Tommie Sams, DO Taking Active   citalopram (CELEXA) 40 MG tablet [id number] Yes TAKE 1 TABLET BY MOUTH EVERY DAY Everlene Other G, DO Taking Active   Clindamycin Phosphate, 1 Dose, (CLINDESSE) vaginal cream [id number] Yes Apply 1 applicator (100 mg) per vagina once at bedtime. Patton Salles, MD Taking Active   doxycycline (VIBRAMYCIN) 100 MG capsule [id number] Yes  Take 1 capsule (100 mg total) by mouth 2 (two) times daily for 7 days. Maxwell Marion, PA-C Taking Active   Dulaglutide (TRULICITY) 0.75 MG/0.5ML SOPN [id number] Yes INJECT 0.75 MG SUBCUTANEOUSLY ONCE A WEEK Cook, [place] G, DO Taking Active   DULoxetine (CYMBALTA) 30 MG capsule [id number] Yes Take 1 capsule (30 mg total) by mouth daily. Lovorn, Aundra Millet, MD Taking Active   empagliflozin (JARDIANCE) 10 MG TABS tablet [id number] Yes Take 1 tablet (10 mg total) by mouth daily before breakfast. Jake Bathe, MD Taking Active   fluticasone [facility]) 50 MCG/ACT nasal spray [id number] Yes USE 2 SPRAY(S) IN EACH NOSTRIL ONCE DAILY AS NEEDED Tommie Sams, DO Taking Active   HYDROcodone-acetaminophen (NORCO/VICODIN) 5-325 MG tablet [id number] Yes Take 1 tablet by mouth every 6 (six) hours as needed for severe pain. Due for refill 05/02/23 Genice Rouge, MD Taking Active   lidocaine (XYLOCAINE) 5 % ointment [id number] No Apply 1 Application topically 4 (four) times daily as needed for mild pain (back or hands).  Patient not taking: Reported on 04/08/2023   Genice Rouge, MD Not Taking Active            Med Note Kelli Churn, Sherlynn Carbon   Fri Mar 15, 2023 12:48 PM) Doesn't work  metFORMIN (GLUCOPHAGE-XR) 750 MG 24 hr tablet [id number] Yes TAKE 1 TABLET BY MOUTH EVERY DAY WITH BREAKFAST [place], [place] G, DO Taking Active   metoprolol succinate (TOPROL XL) 50 MG 24 hr tablet [id number] Yes Take 1 tablet (50 mg total) by mouth daily. Jake Bathe, MD Taking Active   Multiple Vitamin (MULTIVITAMIN) capsule [id number] Yes Take 1 capsule by mouth daily. [provider] Taking Active   nystatin (MYCOSTATIN/NYSTOP) powder [id number] Yes APPLY 1 APPLICATION TOPICALLY 3 (THREE) TIMES DAILY. APPLY TO AFFECTED AREA FOR UP TO 7 DAYS Patton Salles, MD Taking Active   Omega-3 Fatty Acids (FISH OIL) 1000 MG CAPS [id number] Yes Take 1,000 mg by mouth daily. [provider] Taking Active Self  ondansetron (ZOFRAN-ODT) 8  MG disintegrating tablet [id number] Yes DISSOLVE 1 TABLET IN MOUTH EVERY 8 HOURS AS NEEDED FOR NAUSEA OR VOMITING. Tommie Sams, DO Taking Active   pantoprazole (PROTONIX) 40 MG tablet [id number] Yes TAKE 1 TABLET (40 MG TOTAL) BY MOUTH TWICE A DAY BEFORE MEALS Gelene Mink, NP Taking Active   sacubitril-valsartan (ENTRESTO) 24-26 MG [id number] Yes Take 1 tablet by mouth 2 (two) times daily. Jake Bathe, MD Taking Active   Vitamin D, Ergocalciferol, (DRISDOL) 1.25 MG (50000 UNIT) CAPS capsule [id number] Yes Take 1 capsule (50,000 Units total) by mouth once a week. Doreatha Massed, MD Taking Active             Home Care and Equipment/Supplies: Were Home Health Services Ordered?: NA Any new equipment or medical supplies ordered?: NA  Functional Questionnaire: Do you need assistance with bathing/showering or dressing?: No Do you need assistance with meal preparation?: No Do you need assistance with eating?: No Do you have difficulty maintaining continence: No Do you need assistance with getting out of bed/getting out of a chair/moving?: No Do you have difficulty managing or taking your medications?: No  Follow up appointments reviewed: PCP Follow-up appointment confirmed?: NA Specialist Hospital Follow-up appointment confirmed?: Yes Date of Specialist follow-up appointment?: 04/23/23 Follow-Up Specialty Provider:: cardio Do you need transportation to your follow-up appointment?: No Do you understand care options if your condition(s) worsen?: Yes-patient verbalized understanding    SIGNATURE Karena Addison, LPN [facility] Nurse Health Advisor Direct Dial [phone]

## 2023-04-09 ENCOUNTER — Other Ambulatory Visit: Payer: Self-pay | Admitting: Family Medicine

## 2023-04-10 ENCOUNTER — Telehealth: Payer: Self-pay | Admitting: *Deleted

## 2023-04-10 NOTE — Telephone Encounter (Signed)
Urine drug screen for this encounter is consistent for prescribed medication 

## 2023-04-16 ENCOUNTER — Other Ambulatory Visit: Payer: Self-pay | Admitting: Family Medicine

## 2023-04-16 ENCOUNTER — Telehealth: Payer: Self-pay | Admitting: Cardiovascular Disease

## 2023-04-16 DIAGNOSIS — Z01810 Encounter for preprocedural cardiovascular examination: Secondary | ICD-10-CM | POA: Diagnosis not present

## 2023-04-16 DIAGNOSIS — I428 Other cardiomyopathies: Secondary | ICD-10-CM | POA: Diagnosis not present

## 2023-04-16 NOTE — Telephone Encounter (Signed)
Spoke with patient, reviewed pre-procedure letter with her. Answered questions about medications to hold. No further questions at this time

## 2023-04-16 NOTE — Telephone Encounter (Signed)
Patient is calling requesting a callback from a nurse regarding some questions she has for her upcoming procedure.

## 2023-04-17 LAB — CBC WITH DIFFERENTIAL/PLATELET
Basophils Absolute: 0 10*3/uL (ref 0.0–0.2)
Basos: 1 %
EOS (ABSOLUTE): 0.2 10*3/uL (ref 0.0–0.4)
Eos: 2 %
Hematocrit: 44.5 % (ref 34.0–46.6)
Hemoglobin: 14.1 g/dL (ref 11.1–15.9)
Immature Grans (Abs): 0 10*3/uL (ref 0.0–0.1)
Immature Granulocytes: 0 %
Lymphocytes Absolute: 2.3 10*3/uL (ref 0.7–3.1)
Lymphs: 30 %
MCH: 27.2 pg (ref 26.6–33.0)
MCHC: 31.7 g/dL (ref 31.5–35.7)
MCV: 86 fL (ref 79–97)
Monocytes Absolute: 0.5 10*3/uL (ref 0.1–0.9)
Monocytes: 7 %
Neutrophils Absolute: 4.5 10*3/uL (ref 1.4–7.0)
Neutrophils: 60 %
Platelets: 246 10*3/uL (ref 150–450)
RBC: 5.19 x10E6/uL (ref 3.77–5.28)
RDW: 15.9 % — ABNORMAL HIGH (ref 11.7–15.4)
WBC: 7.5 10*3/uL (ref 3.4–10.8)

## 2023-04-17 LAB — BASIC METABOLIC PANEL
BUN/Creatinine Ratio: 10 (ref 9–23)
BUN: 11 mg/dL (ref 6–24)
CO2: 24 mmol/L (ref 20–29)
Calcium: 9.6 mg/dL (ref 8.7–10.2)
Chloride: 103 mmol/L (ref 96–106)
Creatinine, Ser: 1.06 mg/dL — ABNORMAL HIGH (ref 0.57–1.00)
Glucose: 108 mg/dL — ABNORMAL HIGH (ref 70–99)
Potassium: 4.8 mmol/L (ref 3.5–5.2)
Sodium: 144 mmol/L (ref 134–144)
eGFR: 61 mL/min/{1.73_m2} (ref >59)

## 2023-04-20 DIAGNOSIS — Z419 Encounter for procedure for purposes other than remedying health state, unspecified: Secondary | ICD-10-CM | POA: Diagnosis not present

## 2023-04-22 NOTE — Pre-Procedure Instructions (Signed)
Instructed patient on the following items: Arrival time 0515 Nothing to eat or drink after midnight No meds AM of procedure Responsible person to drive you home and stay with you for 24 hrs Wash with special soap night before and morning of procedure  

## 2023-04-23 ENCOUNTER — Ambulatory Visit (HOSPITAL_COMMUNITY)
Admission: RE | Disposition: A | Discharge status: Home / Self Care | Payer: Self-pay | Source: Home / Self Care | Attending: Cardiovascular Disease

## 2023-04-23 ENCOUNTER — Other Ambulatory Visit: Payer: Self-pay | Admitting: Obstetrics and Gynecology

## 2023-04-23 ENCOUNTER — Other Ambulatory Visit: Payer: Self-pay

## 2023-04-23 ENCOUNTER — Ambulatory Visit (HOSPITAL_COMMUNITY)
Admission: RE | Admit: 2023-04-23 | Discharge: 2023-04-23 | Disposition: A | Discharge status: Home / Self Care | Payer: Medicaid Other | Attending: Cardiovascular Disease | Admitting: Cardiovascular Disease

## 2023-04-23 ENCOUNTER — Ambulatory Visit (HOSPITAL_COMMUNITY): Payer: Medicaid Other

## 2023-04-23 DIAGNOSIS — R0602 Shortness of breath: Secondary | ICD-10-CM | POA: Insufficient documentation

## 2023-04-23 DIAGNOSIS — I447 Left bundle-branch block, unspecified: Secondary | ICD-10-CM | POA: Diagnosis not present

## 2023-04-23 DIAGNOSIS — I509 Heart failure, unspecified: Secondary | ICD-10-CM | POA: Diagnosis present

## 2023-04-23 DIAGNOSIS — I5020 Unspecified systolic (congestive) heart failure: Secondary | ICD-10-CM | POA: Diagnosis not present

## 2023-04-23 DIAGNOSIS — I428 Other cardiomyopathies: Secondary | ICD-10-CM | POA: Insufficient documentation

## 2023-04-23 DIAGNOSIS — Z79899 Other long term (current) drug therapy: Secondary | ICD-10-CM | POA: Insufficient documentation

## 2023-04-23 DIAGNOSIS — R238 Other skin changes: Secondary | ICD-10-CM

## 2023-04-23 DIAGNOSIS — I471 Supraventricular tachycardia, unspecified: Secondary | ICD-10-CM | POA: Diagnosis not present

## 2023-04-23 DIAGNOSIS — I110 Hypertensive heart disease with heart failure: Secondary | ICD-10-CM | POA: Insufficient documentation

## 2023-04-23 DIAGNOSIS — I5022 Chronic systolic (congestive) heart failure: Secondary | ICD-10-CM | POA: Diagnosis not present

## 2023-04-23 DIAGNOSIS — Z853 Personal history of malignant neoplasm of breast: Secondary | ICD-10-CM | POA: Diagnosis not present

## 2023-04-23 DIAGNOSIS — Z920 Personal history of contraception: Secondary | ICD-10-CM | POA: Diagnosis not present

## 2023-04-23 HISTORY — PX: BIV ICD INSERTION CRT-D: EP1195

## 2023-04-23 LAB — GLUCOSE, CAPILLARY
Glucose-Capillary: 114 mg/dL — ABNORMAL HIGH (ref 70–99)
Glucose-Capillary: 116 mg/dL — ABNORMAL HIGH (ref 70–99)

## 2023-04-23 SURGERY — BIV ICD INSERTION CRT-D

## 2023-04-23 MED ORDER — SODIUM CHLORIDE 0.9 % IV SOLN
80.0000 mg | INTRAVENOUS | Status: AC
  Administered 2023-04-23: 80 mg

## 2023-04-23 MED ORDER — CEFAZOLIN SODIUM-DEXTROSE 1-4 GM/50ML-% IV SOLN: 1.0000 g | Freq: Four times a day (QID) | INTRAVENOUS | Status: DC

## 2023-04-23 MED ORDER — MIDAZOLAM HCL 2 MG/2ML IJ SOLN
INTRAMUSCULAR | Status: AC
  Filled 2023-04-23: qty 2

## 2023-04-23 MED ORDER — HEPARIN (PORCINE) IN NACL 1000-0.9 UT/500ML-% IV SOLN
INTRAVENOUS | Status: DC | PRN
  Administered 2023-04-23: 500 mL

## 2023-04-23 MED ORDER — FENTANYL CITRATE (PF) 100 MCG/2ML IJ SOLN
INTRAMUSCULAR | Status: AC
  Filled 2023-04-23: qty 2

## 2023-04-23 MED ORDER — ACETAMINOPHEN 325 MG PO TABS
325.0000 mg | ORAL_TABLET | ORAL | Status: DC | PRN
  Administered 2023-04-23: 650 mg via ORAL
  Filled 2023-04-23: qty 2

## 2023-04-23 MED ORDER — LIDOCAINE HCL (PF) 1 % IJ SOLN
INTRAMUSCULAR | Status: DC | PRN
  Administered 2023-04-23: 60 mL

## 2023-04-23 MED ORDER — FENTANYL CITRATE (PF) 100 MCG/2ML IJ SOLN
INTRAMUSCULAR | Status: DC | PRN
  Administered 2023-04-23 (×3): 25 ug via INTRAVENOUS

## 2023-04-23 MED ORDER — ONDANSETRON HCL 4 MG/2ML IJ SOLN: 4.0000 mg | Freq: Four times a day (QID) | INTRAMUSCULAR | Status: DC | PRN

## 2023-04-23 MED ORDER — CEFAZOLIN SODIUM-DEXTROSE 2-4 GM/100ML-% IV SOLN
INTRAVENOUS | Status: AC
  Administered 2023-04-23: 2 g via INTRAVENOUS
  Filled 2023-04-23: qty 100

## 2023-04-23 MED ORDER — MIDAZOLAM HCL 5 MG/5ML IJ SOLN
INTRAMUSCULAR | Status: DC | PRN
  Administered 2023-04-23 (×3): 1 mg via INTRAVENOUS

## 2023-04-23 MED ORDER — SODIUM CHLORIDE 0.9 % IV SOLN: INTRAVENOUS | Status: DC

## 2023-04-23 MED ORDER — CEFAZOLIN SODIUM-DEXTROSE 2-4 GM/100ML-% IV SOLN: 2.0000 g | INTRAVENOUS | Status: AC

## 2023-04-23 MED ORDER — POVIDONE-IODINE 10 % EX SWAB
2.0000 | Freq: Once | CUTANEOUS | Status: AC
  Administered 2023-04-23: 2 via TOPICAL

## 2023-04-23 MED ORDER — SODIUM CHLORIDE 0.9 % IV SOLN
INTRAVENOUS | Status: AC
  Filled 2023-04-23: qty 2

## 2023-04-23 MED ORDER — IOHEXOL 350 MG/ML SOLN
INTRAVENOUS | Status: DC | PRN
  Administered 2023-04-23: 20 mL

## 2023-04-23 SURGICAL SUPPLY — 21 items
BALLN ATTAIN 80 (BALLOONS) ×1
BALLOON ATTAIN 80 (BALLOONS) IMPLANT
CABLE SURGICAL S-101-97-12 (CABLE) ×1 IMPLANT
CATH ATTAIN COM SURV 6250V-EH (CATHETERS) IMPLANT
CATH ATTAIN COM SURV 6250V-MB2 (CATHETERS) IMPLANT
GUIDEWIRE ANGLED .035X150CM (WIRE) IMPLANT
ICD COBALT XT QUAD CRT DTPA2QQ (ICD Generator) IMPLANT
KIT ESSENTIALS PG (KITS) IMPLANT
KIT MICROPUNCTURE NIT STIFF (SHEATH) IMPLANT
LEAD ATTAIN PERFORMA S 4598-88 (Lead) IMPLANT
LEAD CAPSURE NOVUS 5076-52CM (Lead) IMPLANT
LEAD SPRINT QUAT SEC 6935M-62 (Lead) IMPLANT
PAD DEFIB RADIO PHYSIO CONN (PAD) ×1 IMPLANT
SHEATH 7FR PRELUDE SNAP 13 (SHEATH) IMPLANT
SHEATH 9.5FR PRELUDE SNAP 13 (SHEATH) IMPLANT
SHEATH 9FR PRELUDE SNAP 13 (SHEATH) IMPLANT
SLITTER 6232ADJ (MISCELLANEOUS) IMPLANT
TRAY PACEMAKER INSERTION (PACKS) ×1 IMPLANT
WIRE ACUITY WHISPER EDS 4648 (WIRE) IMPLANT
WIRE HI TORQ VERSACORE-J 145CM (WIRE) IMPLANT
WIRE MAILMAN 182CM (WIRE) IMPLANT

## 2023-04-23 NOTE — H&P (Signed)
Electrophysiology Office Note:    Date:  04/23/2023   ID:  [name], DOB 1966-11-11, MRN [id number]  PCP:  Tommie Sams, DO   [facility] Providers Cardiologist:  Marjo Bicker, MD Electrophysiologist:  Maurice Small, MD     Referring MD: No ref. provider found   History of Present Illness:    [name] is a 57 y.o. female with a hx listed below, significant for left bundle branch block, nonischemic cardiomyopathy, history of breast cancer s/p lumpectomy cancer referred for arrhythmia management.  Her echocardiogram performed in August 2023 showed depressed EF, 30 to 35%.  GDMT has been titrated up since that time.  She has been on Entresto, metoprolol XL, and Jardiance since October 2023 at the latest.  Cardiac MRI in April 2024 showed EF less than 35%.  Definitive cause for her cardiomyopathy has not been identified.  CT coronary showed mild nonobstructive coronary disease.  She has a left bundle branch block with QRS greater than 150 ms.  She has chronic shortness of breath and fatigue.  She is making efforts to lose weight but is limited in her ability to walk.  She can barely make it to her mailbox before becoming short of breath.  She reports that there have been no changes in her condition, diagnoses, or medications since our recent clinic visit.  Past Medical History:  Diagnosis Date   Anxiety    Arthritis    Blood transfusion without reported diagnosis 1999   due to heavy menses   Breast cancer (HCC) 05/22/2011   03/01/11, Stage 2, s/p lumpectomy, chemo/xrt   CHF (congestive heart failure) (HCC)    Complication of anesthesia    pt woke up during procedure   DDD (degenerative disc disease), lumbar    Depression 06/22/2011   Diverticulitis    DM (diabetes mellitus) (HCC) 06/22/2011   Genital warts    GERD (gastroesophageal reflux disease) 06/22/2011   Hx of adenomatous colonic polyps 10/2007   4mm sigmoid tubular adenoma, FH colon  cancer, mother in mid-50s   Hypercholesterolemia 06/22/2011   Hypertension 06/22/2011   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   Iron deficiency anemia 03/25/2015   LBBB (left bundle branch block)    Mild CAD    Morbid obesity (HCC)    NICM (nonischemic cardiomyopathy) (HCC)    PSVT (paroxysmal supraventricular tachycardia)    Shortness of breath    STD (sexually transmitted disease)    HPV, 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   Syncope    Syncope    Vaginal bleeding 03/08/2014    Past Surgical History:  Procedure Laterality Date   back surg x2     BALLOON DILATION N/A 09/22/2021   Procedure: BALLOON DILATION;  Surgeon: Lanelle Bal, DO;  [facility];  Service: Endoscopy;  Laterality: N/A;   BIOPSY  09/22/2021   Procedure: BIOPSY;  Surgeon: Lanelle Bal, DO;  [facility];  Service: Endoscopy;;   BREAST LUMPECTOMY Right 2012   CHOLECYSTECTOMY     COLONOSCOPY  11/03/07   4-mm sessile polyp removed/small internal hemorrhoids/tubular adenoma, random colon bx negative for microscopic colitis   COLONOSCOPY WITH PROPOFOL N/A 09/17/2016   Grade 2 hemorrhoids, colonic diverticulosis, ascending colon polyp (sessile serrated adenoma), 5 year surveillance   COLONOSCOPY WITH PROPOFOL N/A 09/22/2021   Procedure: COLONOSCOPY WITH PROPOFOL;  Surgeon: Lanelle Bal, DO;  [facility]: [facility];  Service: Endoscopy;  Laterality: N/A;  7:30am   COLONOSCOPY, ESOPHAGOGASTRODUODENOSCOPY (EGD) AND ESOPHAGEAL DILATION  01/2012   mild gastritis s/p biopsy. Empiric dilation. Normal duodenum.    DILATATION & CURETTAGE/HYSTEROSCOPY WITH MYOSURE N/A 02/10/2015   Procedure: DILATATION & CURETTAGE/HYSTEROSCOPY WITH MYOSURE;  Surgeon: Patton Salles, MD;  Location: [facility];  Service: Gynecology;  Laterality: N/A;   ESOPHAGOGASTRODUODENOSCOPY (EGD) WITH PROPOFOL N/A 01/11/2020   Normal esophagus, s/p dilation, normal stomach, normal duodenum.     ESOPHAGOGASTRODUODENOSCOPY (EGD) WITH PROPOFOL N/A 09/22/2021   Procedure: ESOPHAGOGASTRODUODENOSCOPY (EGD) WITH PROPOFOL;  Surgeon: Lanelle Bal, DO;  [facility];  Service: Endoscopy;  Laterality: N/A;   MALONEY DILATION N/A 01/11/2020   Procedure: Elease Hashimoto DILATION;  Surgeon: Corbin Ade, MD;  [facility];  Service: Endoscopy;  Laterality: N/A;   MM BREAST STEREO BX*L*R/S     rt.   POLYPECTOMY  09/17/2016   Procedure: POLYPECTOMY;  Surgeon: Corbin Ade, MD;  [facility];  Service: Endoscopy;;  ascending colon   POLYPECTOMY  09/22/2021   Procedure: POLYPECTOMY;  Surgeon: Lanelle Bal, DO;  [facility];  Service: Endoscopy;;   PORT-A-CATH REMOVAL  05/26/2012   Procedure: REMOVAL PORT-A-CATH;  Surgeon: [name], MD;  Location: [facility];  Service: General;  Laterality: N/A;  Minor Room   PORTACATH PLACEMENT      Current Medications: Current Meds  Medication Sig   atorvastatin (LIPITOR) 40 MG tablet TAKE 1 TABLET BY MOUTH EVERYDAY AT BEDTIME   cetirizine (ZYRTEC) 10 MG tablet TAKE 1 TABLET BY MOUTH EVERY DAY   citalopram (CELEXA) 40 MG tablet TAKE 1 TABLET BY MOUTH EVERY DAY   Dulaglutide (TRULICITY) 0.75 MG/0.5ML SOPN INJECT 0.75 MG SUBCUTANEOUSLY ONCE A WEEK (Patient taking differently: Inject 0.75 mg into the skin every Friday.)   DULoxetine (CYMBALTA) 30 MG capsule Take 1 capsule (30 mg total) by mouth daily.   fluticasone (FLONASE) 50 MCG/ACT nasal spray USE 2 SPRAY(S) IN EACH NOSTRIL ONCE DAILY AS NEEDED (Patient taking differently: Place 2 sprays into both nostrils daily. USE 2 SPRAY(S) IN EACH NOSTRIL ONCE DAILY AS NEEDED)   HYDROcodone-acetaminophen (NORCO/VICODIN) 5-325 MG tablet Take 1 tablet by mouth every 6 (six) hours as needed for severe pain. Due for refill 05/02/23   metFORMIN (GLUCOPHAGE-XR) 750 MG 24 hr tablet TAKE 1 TABLET BY MOUTH EVERY DAY WITH BREAKFAST   metoprolol succinate (TOPROL XL) 50 MG 24 hr tablet  Take 1 tablet (50 mg total) by mouth daily.   Multiple Vitamin (MULTIVITAMIN) capsule Take 1 capsule by mouth daily.   nystatin (MYCOSTATIN/NYSTOP) powder APPLY 1 APPLICATION TOPICALLY 3 (THREE) TIMES DAILY. APPLY TO AFFECTED AREA FOR UP TO 7 DAYS   ondansetron (ZOFRAN-ODT) 8 MG disintegrating tablet DISSOLVE 1 TABLET IN MOUTH EVERY 8 HOURS AS NEEDED FOR NAUSEA OR VOMITING.   pantoprazole (PROTONIX) 40 MG tablet TAKE 1 TABLET (40 MG TOTAL) BY MOUTH TWICE A DAY BEFORE MEALS   sacubitril-valsartan (ENTRESTO) 24-26 MG Take 1 tablet by mouth 2 (two) times daily.   Vitamin D, Ergocalciferol, (DRISDOL) 1.25 MG (50000 UNIT) CAPS capsule Take 1 capsule (50,000 Units total) by mouth once a week. (Patient taking differently: Take 50,000 Units by mouth every Friday.)     Allergies:   Flagyl [metronidazole], Lansoprazole, and Pravastatin   Social and Family History: Reviewed in Epic  ROS:   Please see the history of present illness.    All other systems reviewed and are negative.  EKGs/Labs/Other Studies Reviewed Today:  Echocardiogram:  07/10/22 TTE EF 30-35%   Monitors:  08/10/2022 Sinus rhythm HR 56-146, avg 98. 9 episodes of SVT occurred, longest 45 seconds. Overall burden of ectopy was < 1% There were approximately 35 pages of patient-triggered events showing sinus rhythm and occasional ectopy.  Stress testing:   Advanced imaging:  CMR 02/26/2023 Mild LVE with global hypokinesis, EF 34%. No delayed gadolinium uptake.  Estimated cardiac output 4.7 L/min.  Coronary CT 08/01/2022 1. Coronary calcium score of 141. This was 28 percentile for age-, sex, and race-matched controls. 2. Normal coronary origin with right dominance. 3. Mild CAD as outlined above. 4. Aortic atherosclerosis   EKG:  Last EKG results: 02/28/23 - sinus rhythm LBBB   Recent Labs: 08/23/2022: ALT 16; Magnesium 2.0; TSH 2.330 04/06/2023: B Natriuretic Peptide 22.0 04/16/2023: BUN 11; Creatinine, Ser 1.06;  Hemoglobin 14.1; Platelets 246; Potassium 4.8; Sodium 144     Physical Exam:    VS:  BP 133/68   Pulse 75   Temp (!) 97.5 F (36.4 C)   Resp 18   Ht 5\' 3"  (1.6 m)   Wt 106.6 kg   LMP 04/05/2016 Comment: spotting 11/2016 and 12/2016  SpO2 95%   BMI 41.63 kg/m     Wt Readings from Last 3 Encounters:  04/23/23 106.6 kg  03/15/23 107.2 kg  03/14/23 106.1 kg     GEN:  Well nourished, well developed in no acute distress, obese CARDIAC: RRR, no murmurs, rubs, gallops RESPIRATORY:  Normal work of breathing MUSCULOSKELETAL: no edema    ASSESSMENT & PLAN:    Non-ischemic cardiomyopathy Etiology unclear EF remains less than 35% despite greater than 3 months of GDMT She meets criteria for CRT-D  I discussed the indication for the procedure and the logistics, risks, potential benefit, and after care. I specifically explained that risks include but are not limited to infection, bleeding,damage to blood vessels, lung, and the heart -- but risk of prolonged hospitalization, need for surgery, or the event of stroke, heart attack, or death are low but not zero. She is ready to proceed   LBBB Possibly causing cardiomyopathy QRS greater than 150 ms Will address with a CRT  CHFrEF NYHA II-III Continue Entresto 24-26, Jardiance 10 mg, metoprolol XL 50 mg  PSVT Appears to be subclinical Will monitor with the device            Medication Adjustments/Labs and Tests Ordered: Current medicines are reviewed at length with the patient today.  Concerns regarding medicines are outlined above.  Orders Placed This Encounter  Procedures   Glucose, capillary   Informed Consent Details: Physician/Practitioner Attestation; Transcribe to consent form and obtain patient signature   Initiate Pre-op Protocol   Apply Cardiac Implantable Device Care Plan   Void on call to EP Lab   Electrode Placement Place arm electrodes on posterior shoulders   Confirm CBC and BMP (or CMP) results within  7 days for inpatient and 30 days for outpatient:   Pre-admission testing diagnosis   Use clippers to remove hair, entire chest area   EP PPM/ICD IMPLANT   Insert peripheral IV   Meds ordered this encounter  Medications   0.9 %  sodium chloride infusion   gentamicin (GARAMYCIN) 80 mg in sodium chloride 0.9 % 500 mL irrigation   ceFAZolin (ANCEF) IVPB 2g/100 mL premix    Order Specific Question:   Indication:    Answer:   Surgical Prophylaxis   povidone-iodine 10 % swab 2 Application  Signed, Maurice Small, MD  04/23/2023 7:22 AM    [facility]

## 2023-04-23 NOTE — Discharge Instructions (Signed)
After Your ICD (Implantable Cardiac Defibrillator)   You have a [employer] ICD  ACTIVITY Do not lift your arm above shoulder height for 1 week after your procedure. After 7 days, you may progress as below.  You should remove your sling 24 hours after your procedure, unless otherwise instructed by your provider.     Tuesday April 30, 2023  Wednesday May 01, 2023 Thursday May 02, 2023 Friday May 03, 2023   Do not lift, push, pull, or carry anything over 10 pounds with the affected arm until 6 weeks (Tuesday June 04, 2023 ) after your procedure.   You may drive AFTER your wound check, unless you have been told otherwise by your provider.   Ask your healthcare provider when you can go back to work   INCISION/Dressing   If large square, outer bandage is left in place, this can be removed after 24 hours from your procedure. Do not remove steri-strips or glue as below.   Monitor your defibrillator site for redness, swelling, and drainage. Call the device clinic at [phone] if you experience these symptoms or fever/chills.  If your incision is sealed with Steri-strips or staples, you may shower 7 days after your procedure or when told by your provider. Do not remove the steri-strips or let the shower hit directly on your site. You may wash around your site with soap and water.    If you were discharged in a sling, please do not wear this during the day more than 48 hours after your surgery unless otherwise instructed. This may increase the risk of stiffness and soreness in your shoulder.   Avoid lotions, ointments, or perfumes over your incision until it is well-healed.  You may use a hot tub or a pool AFTER your wound check appointment if the incision is completely closed.  Your ICD is designed to protect you from life threatening heart rhythms. Because of this, you may receive a shock.   1 shock with no symptoms:  Call the office during business hours. 1 shock with symptoms  (chest pain, chest pressure, dizziness, lightheadedness, shortness of breath, overall feeling unwell):  Call 911. If you experience 2 or more shocks in 24 hours:  Call 911. If you receive a shock, you should not drive for 6 months per the [place] DMV IF you receive appropriate therapy from your ICD.   ICD Alerts:  Some alerts are vibratory and others beep. These are NOT emergencies. Please call our office to let us know. If this occurs at night or on weekends, it can wait until the next business day. Send a remote transmission.  If your device is capable of reading fluid status (for heart failure), you will be offered monthly monitoring to review this with you.   DEVICE MANAGEMENT Remote monitoring is used to monitor your ICD from home. This monitoring is scheduled every 91 days by our office. It allows [place] to keep an eye on the functioning of your device to ensure it is working properly. You will routinely see your Electrophysiologist annually (more often if necessary).   You should receive your ID card for your new device in 4-8 weeks. Keep this card with you at all times once received. Consider wearing a medical alert bracelet or necklace.  Your ICD  may be MRI compatible. This will be discussed at your next office visit/wound check.  You should avoid contact with strong electric or magnetic fields.   Do not use amateur (ham) radio equipment or electric (  arc) welding torches. MP3 player headphones with magnets should not be used. Some devices are safe to use if held at least 12 inches (30 cm) from your defibrillator. These include power tools, lawn mowers, and speakers. If you are unsure if something is safe to use, ask your health care provider.  When using your cell phone, hold it to the ear that is on the opposite side from the defibrillator. Do not leave your cell phone in a pocket over the defibrillator.  You may safely use electric blankets, heating pads, computers, and microwave ovens.  Call  the office right away if: You have chest pain. You feel more than one shock. You feel more short of breath than you have felt before. You feel more light-headed than you have felt before. Your incision starts to open up.  This information is not intended to replace advice given to you by your health care provider. Make sure you discuss any questions you have with your health care provider.

## 2023-04-24 ENCOUNTER — Encounter (HOSPITAL_COMMUNITY): Payer: Self-pay | Admitting: Cardiovascular Disease

## 2023-04-24 MED FILL — Midazolam HCl Inj 2 MG/2ML (Base Equivalent): INTRAMUSCULAR | Qty: 3 | Status: AC

## 2023-04-24 NOTE — Telephone Encounter (Signed)
Med refill request: Nystatin powder Last AEX: 03/12/23 RF sent 04/03/23 Refill denied.

## 2023-04-28 ENCOUNTER — Other Ambulatory Visit: Payer: Self-pay | Admitting: Family Medicine

## 2023-04-28 DIAGNOSIS — E1165 Type 2 diabetes mellitus with hyperglycemia: Secondary | ICD-10-CM

## 2023-04-29 ENCOUNTER — Other Ambulatory Visit: Payer: Self-pay | Admitting: Hematology

## 2023-04-29 DIAGNOSIS — C50911 Malignant neoplasm of unspecified site of right female breast: Secondary | ICD-10-CM

## 2023-04-30 ENCOUNTER — Other Ambulatory Visit: Payer: Self-pay | Admitting: Obstetrics and Gynecology

## 2023-04-30 ENCOUNTER — Other Ambulatory Visit: Payer: Self-pay | Admitting: Family Medicine

## 2023-05-01 NOTE — Telephone Encounter (Signed)
Medication refill request: nystatin powder Last AEX:  03-12-23 Next AEX: not scheduled Last MMG (if hormonal medication request): n/a Refill authorized: last refilled 04-03-23 30 grams with 2 refills. This rx is denied

## 2023-05-02 ENCOUNTER — Ambulatory Visit: Payer: Medicaid Other | Attending: Cardiology

## 2023-05-02 DIAGNOSIS — I428 Other cardiomyopathies: Secondary | ICD-10-CM

## 2023-05-02 DIAGNOSIS — I447 Left bundle-branch block, unspecified: Secondary | ICD-10-CM

## 2023-05-02 LAB — CUP PACEART INCLINIC DEVICE CHECK
Date Time Interrogation Session: 20240613172706
Eval Rhythm: NORMAL
Implantable Lead Connection Status: 753985
Implantable Lead Connection Status: 753985
Implantable Lead Connection Status: 753985
Implantable Lead Implant Date: 20240604
Implantable Lead Implant Date: 20240604
Implantable Lead Implant Date: 20240604
Implantable Lead Location: 753858
Implantable Lead Location: 753859
Implantable Lead Location: 753860
Implantable Lead Model: 4598
Implantable Lead Model: 5076
Implantable Pulse Generator Implant Date: 20240604

## 2023-05-02 MED ORDER — APIXABAN 5 MG PO TABS
5.0000 mg | ORAL_TABLET | Freq: Two times a day (BID) | ORAL | 0 refills | Status: DC
Start: 2023-05-02 — End: 2023-11-19

## 2023-05-02 NOTE — Progress Notes (Signed)
Wound check appointment. BV pacing 98.5%. Steri-strips removed. Wound without redness or edema. Incision edges approximated, wound well healed. Normal device function. Thresholds, sensing, and impedances consistent with implant measurements. Device programmed at 3.5V for extra safety margin until 3 month visit. Histogram distribution appropriate for patient and level of activity. No mode switches or ventricular arrhythmias noted. Patient educated about wound care, arm mobility, lifting restrictions, shock plan. ROV in 3 months with implanting physician.  LUE edematous > RUE. LUE Radial +1 vs RUE Radial +2. Limited movement + Pain. Temperature and sensation comparatively similar and WNL. MD Graciela Husbands in clinic to assess pt. 6 weeks eliquis ordered r/t probable clot per Graciela Husbands. Sent to A. Mealor MD for review.

## 2023-05-02 NOTE — Patient Instructions (Addendum)
You will start taking Eliquis 5 mg-  Take one tablet by mouth twice a day for 6 weeks. -You are getting 2 weeks worth of samples.  I have sent in a one month supply of Eliquis to your pharmacy. You will have enough medication to complete 6 weeks of therapy.  After Your ICD (Implantable Cardiac Defibrillator)    Monitor your defibrillator site for redness, swelling, and drainage. Call the device clinic at [phone] if you experience these symptoms or fever/chills.  Your incision was closed with Steri-strips or staples:  You may shower 7 days after your procedure and wash your incision with soap and water. Avoid lotions, ointments, or perfumes over your incision until it is well-healed.  You may use a hot tub or a pool after your wound check appointment if the incision is completely closed.  Do not lift, push or pull greater than 10 pounds with the affected arm until 6 weeks after your procedure. There are no other restrictions in arm movement after your wound check appointment.   Your ICD is designed to protect you from life threatening heart rhythms. Because of this, you may receive a shock.   1 shock with no symptoms:  Call the office during business hours. 1 shock with symptoms (chest pain, chest pressure, dizziness, lightheadedness, shortness of breath, overall feeling unwell):  Call 911. If you experience 2 or more shocks in 24 hours:  Call 911. If you receive a shock, you should not drive.  [employer] DMV - no driving for 6 months if you receive appropriate therapy from your ICD.   ICD Alerts:  Some alerts are vibratory and others beep. These are NOT emergencies. Please call our office to let us know. If this occurs at night or on weekends, it can wait until the next business day. Send a remote transmission.  If your device is capable of reading fluid status (for heart failure), you will be offered monthly monitoring to review this with you.   Remote monitoring is used to monitor your  ICD from home. This monitoring is scheduled every 91 days by our office. It allows [place] to keep an eye on the functioning of your device to ensure it is working properly. You will routinely see your Electrophysiologist annually (more often if necessary).

## 2023-05-03 ENCOUNTER — Telehealth: Payer: Self-pay | Admitting: *Deleted

## 2023-05-03 NOTE — Telephone Encounter (Signed)
Lipids done by Dr.Cook in April were at goal.Patient should remain on Atorvastatin. Per DPR, I left message on home number.

## 2023-05-03 NOTE — Telephone Encounter (Signed)
Patient had stopped zetia because her insurance did not pay for it and when she stopped it she had dizziness which she blamed on medication.PharmD did not feel stopping zetia caused her symptoms. Her lipids were done by pcp in April  and he noted stable/at goal. I did encourage her to limit her sweets/carbohydrate as her triglydeides were elevated. She said she is making dietary modifications now.

## 2023-05-03 NOTE — Telephone Encounter (Signed)
-----   Message from Wiliam Ke, RN sent at 05/02/2023  5:05 PM EDT ----- Regarding: question about statin We saw this Pt today in device clinic.  She has atorvastatin on her med list, but she thinks someone told her to stop it?  She wanted to know if she should be taking it.  Could you please advise Pt?  Thank you, Dierdre Highman

## 2023-05-03 NOTE — Telephone Encounter (Signed)
Patient is returning call.  °

## 2023-05-03 NOTE — Telephone Encounter (Signed)
Returned call to pt. No answer. Left msg to call back.  

## 2023-05-03 NOTE — Telephone Encounter (Signed)
Patient is following up. She states the medication to be discussed is Ezetimibe, not Atorvastin. Please return call to [phone].

## 2023-05-06 ENCOUNTER — Other Ambulatory Visit (HOSPITAL_COMMUNITY): Payer: Self-pay | Admitting: Hematology

## 2023-05-06 DIAGNOSIS — Z1231 Encounter for screening mammogram for malignant neoplasm of breast: Secondary | ICD-10-CM

## 2023-05-08 ENCOUNTER — Other Ambulatory Visit: Payer: Self-pay | Admitting: Family Medicine

## 2023-05-08 DIAGNOSIS — F324 Major depressive disorder, single episode, in partial remission: Secondary | ICD-10-CM

## 2023-05-09 ENCOUNTER — Inpatient Hospital Stay: Payer: Medicaid Other

## 2023-05-09 ENCOUNTER — Other Ambulatory Visit: Payer: 59

## 2023-05-09 ENCOUNTER — Ambulatory Visit (HOSPITAL_COMMUNITY): Payer: 59

## 2023-05-09 ENCOUNTER — Telehealth: Payer: Self-pay

## 2023-05-09 NOTE — Telephone Encounter (Signed)
Pt called office to report her employer will not let her work with lifting restrictions on her letter.  Per Pt she is not required to lift, push or pull anything greater than 10 pounds at her job.  Modified letter per Pt request to remove restrictions.

## 2023-05-16 ENCOUNTER — Ambulatory Visit: Payer: Self-pay | Admitting: Physician Assistant

## 2023-05-16 ENCOUNTER — Ambulatory Visit: Payer: 59 | Admitting: Physician Assistant

## 2023-05-20 DIAGNOSIS — Z419 Encounter for procedure for purposes other than remedying health state, unspecified: Secondary | ICD-10-CM | POA: Diagnosis not present

## 2023-05-21 ENCOUNTER — Inpatient Hospital Stay: Payer: Medicaid Other | Admitting: Physician Assistant

## 2023-05-22 ENCOUNTER — Other Ambulatory Visit: Payer: Self-pay | Admitting: Family Medicine

## 2023-05-30 ENCOUNTER — Other Ambulatory Visit: Payer: Self-pay

## 2023-05-30 MED ORDER — DULOXETINE HCL 30 MG PO CPEP: 30.0000 mg | ORAL_CAPSULE | Freq: Every day | ORAL | 1 refills | Status: DC

## 2023-05-30 MED ORDER — HYDROCODONE-ACETAMINOPHEN 5-325 MG PO TABS: 1.0000 | ORAL_TABLET | Freq: Four times a day (QID) | ORAL | 0 refills | Status: DC | PRN

## 2023-06-07 ENCOUNTER — Ambulatory Visit
Admission: EM | Admit: 2023-06-07 | Discharge: 2023-06-07 | Disposition: A | Discharge status: Home / Self Care | Payer: Medicaid Other | Attending: Nurse Practitioner | Admitting: Nurse Practitioner

## 2023-06-07 ENCOUNTER — Ambulatory Visit (INDEPENDENT_AMBULATORY_CARE_PROVIDER_SITE_OTHER): Payer: Medicaid Other

## 2023-06-07 DIAGNOSIS — M25562 Pain in left knee: Secondary | ICD-10-CM

## 2023-06-07 DIAGNOSIS — M1712 Unilateral primary osteoarthritis, left knee: Secondary | ICD-10-CM

## 2023-06-07 DIAGNOSIS — R531 Weakness: Secondary | ICD-10-CM | POA: Diagnosis not present

## 2023-06-07 NOTE — ED Provider Notes (Signed)
[facility]    CSN: [id number] Arrival date & time: 06/07/23  1209      History   Chief Complaint Chief Complaint  Patient presents with   Knee Pain    HPI [name] is a 57 y.o. female.   The history is provided by the patient.   The patient presents for complaints of left knee pain. Symptoms started 1 day ago. She complains of pain with ambulating, and states it has become more difficult to walk. She also feels that the knee is swollen. She states the left knee feels like it is "going to give out". She denies injury, trauma, numbness, tingling, radiation of pain or inability to bear weight. She reports that she is on blood thinners and is concerned about a blood clot. The patient states that she has taken her regular pain medication with minimal relief.   Past Medical History:  Diagnosis Date   Anxiety    Arthritis    Blood transfusion without reported diagnosis 1999   due to heavy menses   Breast cancer (HCC) 05/22/2011   03/01/11, Stage 2, s/p lumpectomy, chemo/xrt   CHF (congestive heart failure) (HCC)    Complication of anesthesia    pt woke up during procedure   DDD (degenerative disc disease), lumbar    Depression 06/22/2011   Diverticulitis    DM (diabetes mellitus) (HCC) 06/22/2011   Genital warts    GERD (gastroesophageal reflux disease) 06/22/2011   Hx of adenomatous colonic polyps 10/2007   4mm sigmoid tubular adenoma, FH colon cancer, mother in mid-50s   Hypercholesterolemia 06/22/2011   Hypertension 06/22/2011   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   Iron deficiency anemia 03/25/2015   LBBB (left bundle branch block)    Mild CAD    Morbid obesity (HCC)    NICM (nonischemic cardiomyopathy) (HCC)    PSVT (paroxysmal supraventricular tachycardia)    Shortness of breath    STD (sexually transmitted disease)    HPV, 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   Syncope    Syncope    Vaginal bleeding 03/08/2014    Patient  Active Problem List   Diagnosis Date Noted   Lumbar radiculopathy 09/07/2022   NICM (nonischemic cardiomyopathy) (HCC) 08/29/2022   CAD (coronary artery disease) 08/29/2022   Aortic atherosclerosis (HCC) 08/29/2022   Lipoma 05/16/2022   Syncope 05/02/2022   Chronic pain syndrome 05/01/2022   LBBB (left bundle branch block) 05/01/2022   Diabetes mellitus without complication (HCC) 12/22/2020   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   Fatty liver 09/06/2016   H/O adenomatous polyp of colon 01/24/2012   FH: colon cancer 01/24/2012   GERD (gastroesophageal reflux disease) 06/22/2011   Hyperlipidemia 06/22/2011   Essential hypertension 06/22/2011   Obesity 06/22/2011   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    Past Surgical History:  Procedure Laterality Date   back surg x2     BALLOON DILATION N/A 09/22/2021   Procedure: BALLOON DILATION;  Surgeon: Lanelle Bal, DO;  Location: [facility];  Service: Endoscopy;  Laterality: N/A;   BIOPSY  09/22/2021   Procedure: BIOPSY;  Surgeon: Lanelle Bal, DO;  Location: [facility];  Service: Endoscopy;;   BIV ICD INSERTION CRT-D N/A 04/23/2023   Procedure: BIV ICD INSERTION CRT-D;  Surgeon: Maurice Small, MD;  Location: [facility];  Service: Cardiovascular;  Laterality: N/A;   BREAST LUMPECTOMY Right 2012  CHOLECYSTECTOMY     COLONOSCOPY  11/03/07   4-mm sessile polyp removed/small internal hemorrhoids/tubular adenoma, random colon bx negative for microscopic colitis   COLONOSCOPY WITH PROPOFOL N/A 09/17/2016   Grade 2 hemorrhoids, colonic diverticulosis, ascending colon polyp (sessile serrated adenoma), 5 year surveillance   COLONOSCOPY WITH PROPOFOL N/A 09/22/2021   Procedure: COLONOSCOPY WITH PROPOFOL;  Surgeon: Lanelle Bal, DO;  Location: [facility];  Service: Endoscopy;  Laterality: N/A;  7:30am   COLONOSCOPY, ESOPHAGOGASTRODUODENOSCOPY  (EGD) AND ESOPHAGEAL DILATION  01/2012   mild gastritis s/p biopsy. Empiric dilation. Normal duodenum.    DILATATION & CURETTAGE/HYSTEROSCOPY WITH MYOSURE N/A 02/10/2015   Procedure: DILATATION & CURETTAGE/HYSTEROSCOPY WITH MYOSURE;  Surgeon: Patton Salles, MD;  Location: [facility];  Service: Gynecology;  Laterality: N/A;   ESOPHAGOGASTRODUODENOSCOPY (EGD) WITH PROPOFOL N/A 01/11/2020   Normal esophagus, s/p dilation, normal stomach, normal duodenum.    ESOPHAGOGASTRODUODENOSCOPY (EGD) WITH PROPOFOL N/A 09/22/2021   Procedure: ESOPHAGOGASTRODUODENOSCOPY (EGD) WITH PROPOFOL;  Surgeon: Lanelle Bal, DO;  Location: [facility];  Service: Endoscopy;  Laterality: N/A;   MALONEY DILATION N/A 01/11/2020   Procedure: Elease Hashimoto DILATION;  Surgeon: Corbin Ade, MD;  Location: [facility];  Service: Endoscopy;  Laterality: N/A;   MM BREAST STEREO BX*L*R/S     rt.   POLYPECTOMY  09/17/2016   Procedure: POLYPECTOMY;  Surgeon: Corbin Ade, MD;  Location: [facility];  Service: Endoscopy;;  ascending colon   POLYPECTOMY  09/22/2021   Procedure: POLYPECTOMY;  Surgeon: Lanelle Bal, DO;  Location: [facility];  Service: Endoscopy;;   PORT-A-CATH REMOVAL  05/26/2012   Procedure: REMOVAL PORT-A-CATH;  Surgeon: [name], MD;  Location: [facility];  Service: General;  Laterality: N/A;  Minor Room   PORTACATH PLACEMENT      OB History     Gravida  4   Para  3   Term  0   Preterm  0   AB  1   Living  3      SAB  0   IAB  0   Ectopic  0   Multiple  0   Live Births  3            Home Medications    Prior to Admission medications   Medication Sig Start Date End Date Taking? Authorizing Provider  apixaban (ELIQUIS) 5 MG TABS tablet Take 1 tablet (5 mg total) by mouth 2 (two) times daily. 05/02/23  Yes Duke Salvia, MD  atorvastatin (LIPITOR) 40 MG tablet TAKE 1 TABLET BY MOUTH EVERYDAY AT BEDTIME 04/11/23  Yes Cook, Jayce G, DO  cetirizine (ZYRTEC) 10 MG  tablet TAKE 1 TABLET BY MOUTH EVERY DAY 03/25/23  Yes Cook, Jayce G, DO  citalopram (CELEXA) 40 MG tablet TAKE 1 TABLET BY MOUTH EVERY DAY 05/08/23  Yes Cook, Jayce G, DO  Dulaglutide (TRULICITY) 0.75 MG/0.5ML SOPN INJECT 0.75 MG SUBCUTANEOUSLY ONCE A WEEK 04/29/23  Yes Cook, Jayce G, DO  DULoxetine (CYMBALTA) 30 MG capsule Take 1 capsule (30 mg total) by mouth daily. 05/30/23  Yes Lovorn, Aundra Millet, MD  empagliflozin (JARDIANCE) 10 MG TABS tablet Take 1 tablet (10 mg total) by mouth daily before breakfast. 01/02/23  Yes Skains, Veverly Fells, MD  fluticasone (FLONASE) 50 MCG/ACT nasal spray USE 2 SPRAY(S) IN EACH NOSTRIL ONCE DAILY AS NEEDED Patient taking differently: Place 2 sprays into both nostrils daily. USE 2 SPRAY(S) IN EACH NOSTRIL ONCE DAILY AS NEEDED 01/02/23  Yes Cook, Jayce G, DO  HYDROcodone-acetaminophen (NORCO/VICODIN) 5-325 MG tablet Take 1 tablet by mouth every 6 (six) hours as needed for severe pain. Due for refill 05/02/23 05/30/23  Yes Lovorn, Aundra Millet, MD  metFORMIN (GLUCOPHAGE-XR) 750 MG 24 hr tablet TAKE 1 TABLET BY MOUTH EVERY DAY WITH BREAKFAST 04/11/23  Yes Cook, Jayce G, DO  metoprolol succinate (TOPROL XL) 50 MG 24 hr tablet Take 1 tablet (50 mg total) by mouth daily. 12/04/22  Yes Jake Bathe, MD  Multiple Vitamin (MULTIVITAMIN) capsule Take 1 capsule by mouth daily.   Yes [provider]  nystatin (MYCOSTATIN/NYSTOP) powder APPLY 1 APPLICATION TOPICALLY 3 (THREE) TIMES DAILY. APPLY TO AFFECTED AREA FOR UP TO 7 DAYS 04/03/23  Yes Amundson Shirley Friar, MD  ondansetron (ZOFRAN-ODT) 8 MG disintegrating tablet DISSOLVE 1 TABLET IN MOUTH EVERY 8 HOURS AS NEEDED FOR NAUSEA OR VOMITING. 05/22/23  Yes Cook, Jayce G, DO  pantoprazole (PROTONIX) 40 MG tablet TAKE 1 TABLET (40 MG TOTAL) BY MOUTH TWICE A DAY BEFORE MEALS 01/30/23  Yes Gelene Mink, NP  sacubitril-valsartan (ENTRESTO) 24-26 MG Take 1 tablet by mouth 2 (two) times daily. 07/12/22  Yes Jake Bathe, MD  Vitamin D, Ergocalciferol,  (DRISDOL) 1.25 MG (50000 UNIT) CAPS capsule TAKE 1 CAPSULE BY MOUTH ONE TIME PER WEEK 04/30/23  Yes Doreatha Massed, MD  blood glucose meter kit and supplies Dispense based on patient and insurance preference. Use up to four times daily as directed. (FOR ICD-10 E10.9, E11.9). 01/05/20   Merlyn Albert, MD  Blood Pressure KIT 1 Units by Does not apply route daily. 02/28/23   Mallipeddi, Vishnu P, MD  Clindamycin Phosphate, 1 Dose, (CLINDESSE) vaginal cream Apply 1 applicator (100 mg) per vagina once at bedtime. Patient not taking: Reported on 04/22/2023 03/19/23   Patton Salles, MD  lidocaine (XYLOCAINE) 5 % ointment Apply 1 Application topically 4 (four) times daily as needed for mild pain (back or hands). Patient not taking: Reported on 04/08/2023 12/17/22   Lovorn, Aundra Millet, MD  Omega-3 Fatty Acids (FISH OIL) 1000 MG CAPS Take 1,000 mg by mouth daily.    [provider]    Family History Family History  Problem Relation Age of Onset   Colon cancer Mother        mid-50s, died of metastatic disease   Cancer Mother        liver   Coronary artery disease Mother    Diabetes type I Mother    Peripheral vascular disease Mother        Carotid disease in her 51s   Coronary artery disease Father        CAD in his 70s   Diabetes Father    Diabetes type I Father    Hypertension Father    Bipolar disorder Sister    Coronary artery disease Brother        MI in his 16s   Hypertension Brother    Hypertension Maternal Grandmother    Hyperlipidemia Maternal Grandmother    Stroke Maternal Grandmother    Hypertension Maternal Grandfather    Diabetes Maternal Grandfather    Diabetes Paternal Grandmother    Heart disease Paternal Grandfather     Social History Social History   Tobacco Use   Smoking status: Never   Smokeless tobacco: Never  Vaping Use   Vaping status: Never Used  Substance Use Topics   Alcohol use: No    Alcohol/week: 0.0 standard drinks of  alcohol    Drug use: No     Allergies   Flagyl [metronidazole], Lansoprazole, and Pravastatin   Review of Systems Review of Systems Per HPI  Physical Exam Triage Vital Signs ED Triage Vitals  Encounter Vitals Group     BP 06/07/23 1230 98/64     Systolic BP Percentile --      Diastolic BP Percentile --      Pulse Rate 06/07/23 1230 82     Resp 06/07/23 1230 16     Temp 06/07/23 1230 97.8 F (36.6 C)     Temp Source 06/07/23 1230 Oral     SpO2 06/07/23 1230 96 %     Weight --      Height --      Head Circumference --      Peak Flow --      Pain Score 06/07/23 1231 8     Pain Loc --      Pain Education --      Exclude from Growth Chart --    No data found.  Updated Vital Signs BP 98/64 (BP Location: Left Arm)   Pulse 82   Temp 97.8 F (36.6 C) (Oral)   Resp 16   LMP 04/05/2016 Comment: spotting 11/2016 and 12/2016  SpO2 96%   Visual Acuity Right Eye Distance:   Left Eye Distance:   Bilateral Distance:    Right Eye Near:   Left Eye Near:    Bilateral Near:     Physical Exam Vitals and nursing note reviewed.  Constitutional:      General: She is not in acute distress.    Appearance: Normal appearance.  HENT:     Head: Normocephalic.  Eyes:     Extraocular Movements: Extraocular movements intact.     Pupils: Pupils are equal, round, and reactive to light.  Pulmonary:     Effort: Pulmonary effort is normal.  Musculoskeletal:     Cervical back: Normal range of motion.     Left knee: Deformity present. No swelling. Decreased range of motion. Tenderness present over the medial joint line and MCL. Normal pulse.     Comments: No calf swelling or tenderness  Skin:    General: Skin is warm and dry.  Neurological:     General: No focal deficit present.     Mental Status: She is alert and oriented to person, place, and time.  Psychiatric:        Mood and Affect: Mood normal.        Behavior: Behavior normal.      UC Treatments / Results  Labs (all labs ordered  are listed, but only abnormal results are displayed) Labs Reviewed - No data to display  EKG   Radiology DG Knee Complete 4 Views Left  Result Date: 06/07/2023 CLINICAL DATA:  Left knee pain and weakness for 1 day. EXAM: LEFT KNEE - COMPLETE 4+ VIEW COMPARISON:  None Available. FINDINGS: No evidence of fracture, dislocation, or joint effusion. Mild tricompartmental knee joint space narrowing with marginal spurring. Soft tissues are unremarkable. IMPRESSION: Mild tricompartmental osteoarthritis. No acute findings. Electronically Signed   By: Larose Hires D.O.   On: 06/07/2023 13:26    Procedures Procedures (including critical care time)  Medications Ordered in UC Medications - No data to display  Initial Impression / Assessment and Plan / UC Course  I have reviewed the triage vital signs and the nursing notes.  Pertinent labs & imaging results that were available during my  care of the patient were reviewed by me and considered in my medical decision making (see chart for details).  The patient is well-appearing, she is in NAD, vital signs are stable.  Xray of the left knee is negative for fracture or dislocation. Xray does show tricompartmental osteoarthritis, which is most likely the cause of her pain. Knee sleeve provided for the left knee to provide compression and support. Patient advised to apply ice as needed for pain, and gentle ROM exercises to help improve joint mobility. Patient advised if symptoms do not improve, recommend follow-up with orthopedics or with her PCP. Patient was given information for [employer] and for [facility]. Patient was in agreement with this plan of care and verbalizes understanding, all questions were answered, patient is stable for discharge.    Final Clinical Impressions(s) / UC Diagnoses   Final diagnoses:  Left knee pain, unspecified chronicity  Osteoarthritis of left knee, unspecified osteoarthritis type     Discharge  Instructions      The xray does not show a fracture or dislocation. It does show osteoarthritis of the left knee. Continue your regular pain medication.  A knee brace has been provided to give you support and compression. Apply ice or heat as needed. Apply ice for pain or swelling, heat for spasm or stiffness. Apply for 20 minutes, remove for 1 hr, repeat as needed. RICE therapy, rest, ice, compression and elevation. If symptoms are not improving, recommend following up with orthopedics. You can follow up with [facility] or with [facility]. Follow-up as needed.      ED Prescriptions   None    PDMP not reviewed this encounter.   Abran Cantor, NP 06/07/23 1348

## 2023-06-07 NOTE — ED Triage Notes (Signed)
Pt presents with left knee pain that started yesterday. Not taking any OTC medication at this time.

## 2023-06-07 NOTE — Discharge Instructions (Signed)
The xray does not show a fracture or dislocation. It does show osteoarthritis of the left knee. Continue your regular pain medication.  A knee brace has been provided to give you support and compression. Apply ice or heat as needed. Apply ice for pain or swelling, heat for spasm or stiffness. Apply for 20 minutes, remove for 1 hr, repeat as needed. RICE therapy, rest, ice, compression and elevation. If symptoms are not improving, recommend following up with orthopedics. You can follow up with [facility] or with [facility]. Follow-up as needed.

## 2023-06-09 ENCOUNTER — Other Ambulatory Visit: Payer: Self-pay | Admitting: Cardiology

## 2023-06-11 ENCOUNTER — Encounter: Payer: Medicaid Other | Admitting: Registered Nurse

## 2023-06-12 ENCOUNTER — Other Ambulatory Visit: Payer: Self-pay

## 2023-06-12 DIAGNOSIS — E559 Vitamin D deficiency, unspecified: Secondary | ICD-10-CM

## 2023-06-12 DIAGNOSIS — C50911 Malignant neoplasm of unspecified site of right female breast: Secondary | ICD-10-CM

## 2023-06-13 ENCOUNTER — Ambulatory Visit (HOSPITAL_COMMUNITY)
Admission: RE | Admit: 2023-06-13 | Discharge: 2023-06-13 | Disposition: A | Discharge status: Home / Self Care | Payer: Medicaid Other | Source: Ambulatory Visit | Attending: Hematology | Admitting: Hematology

## 2023-06-13 ENCOUNTER — Encounter (HOSPITAL_COMMUNITY): Payer: Self-pay

## 2023-06-13 ENCOUNTER — Inpatient Hospital Stay: Payer: Medicaid Other | Attending: Hematology | Admitting: Oncology

## 2023-06-13 DIAGNOSIS — C50911 Malignant neoplasm of unspecified site of right female breast: Secondary | ICD-10-CM

## 2023-06-13 DIAGNOSIS — Z1231 Encounter for screening mammogram for malignant neoplasm of breast: Secondary | ICD-10-CM | POA: Insufficient documentation

## 2023-06-13 DIAGNOSIS — E559 Vitamin D deficiency, unspecified: Secondary | ICD-10-CM | POA: Insufficient documentation

## 2023-06-13 DIAGNOSIS — Z853 Personal history of malignant neoplasm of breast: Secondary | ICD-10-CM | POA: Diagnosis not present

## 2023-06-13 HISTORY — DX: Personal history of antineoplastic chemotherapy: Z92.21

## 2023-06-13 HISTORY — DX: Personal history of irradiation: Z92.3

## 2023-06-13 LAB — VITAMIN D 25 HYDROXY (VIT D DEFICIENCY, FRACTURES): Vit D, 25-Hydroxy: 73.45 ng/mL (ref 30–100)

## 2023-06-13 LAB — COMPREHENSIVE METABOLIC PANEL
ALT: 18 U/L (ref 0–44)
AST: 17 U/L (ref 15–41)
Albumin: 3.7 g/dL (ref 3.5–5.0)
Alkaline Phosphatase: 65 U/L (ref 38–126)
Anion gap: 7 (ref 5–15)
BUN: 11 mg/dL (ref 6–20)
CO2: 28 mmol/L (ref 22–32)
Calcium: 8.8 mg/dL — ABNORMAL LOW (ref 8.9–10.3)
Chloride: 103 mmol/L (ref 98–111)
Creatinine, Ser: 0.95 mg/dL (ref 0.44–1.00)
GFR, Estimated: >60 mL/min (ref >60)
Glucose, Bld: 105 mg/dL — ABNORMAL HIGH (ref 70–99)
Potassium: 4.1 mmol/L (ref 3.5–5.1)
Sodium: 138 mmol/L (ref 135–145)
Total Bilirubin: 0.8 mg/dL (ref 0.3–1.2)
Total Protein: 7.6 g/dL (ref 6.5–8.1)

## 2023-06-13 LAB — CBC WITH DIFFERENTIAL/PLATELET
Abs Immature Granulocytes: 0.02 10*3/uL (ref 0.00–0.07)
Basophils Absolute: 0 10*3/uL (ref 0.0–0.1)
Basophils Relative: 1 %
Eosinophils Absolute: 0.2 10*3/uL (ref 0.0–0.5)
Eosinophils Relative: 3 %
HCT: 43.3 % (ref 36.0–46.0)
Hemoglobin: 13.5 g/dL (ref 12.0–15.0)
Immature Granulocytes: 0 %
Lymphocytes Relative: 30 %
Lymphs Abs: 2 10*3/uL (ref 0.7–4.0)
MCH: 27.7 pg (ref 26.0–34.0)
MCHC: 31.2 g/dL (ref 30.0–36.0)
MCV: 88.7 fL (ref 80.0–100.0)
Monocytes Absolute: 0.4 10*3/uL (ref 0.1–1.0)
Monocytes Relative: 7 %
Neutro Abs: 4 10*3/uL (ref 1.7–7.7)
Neutrophils Relative %: 59 %
Platelets: 200 10*3/uL (ref 150–400)
RBC: 4.88 MIL/uL (ref 3.87–5.11)
RDW: 15.9 % — ABNORMAL HIGH (ref 11.5–15.5)
WBC: 6.7 10*3/uL (ref 4.0–10.5)
nRBC: 0 % (ref 0.0–0.2)

## 2023-06-15 ENCOUNTER — Other Ambulatory Visit: Payer: Self-pay | Admitting: Family Medicine

## 2023-06-20 ENCOUNTER — Encounter: Payer: Medicaid Other | Attending: Registered Nurse | Admitting: Registered Nurse

## 2023-06-20 VITALS — BP 119/79 | HR 88 | Ht 63.0 in | Wt 234.8 lb

## 2023-06-20 DIAGNOSIS — Z5181 Encounter for therapeutic drug level monitoring: Secondary | ICD-10-CM

## 2023-06-20 DIAGNOSIS — M25561 Pain in right knee: Secondary | ICD-10-CM | POA: Diagnosis not present

## 2023-06-20 DIAGNOSIS — G894 Chronic pain syndrome: Secondary | ICD-10-CM

## 2023-06-20 DIAGNOSIS — Z79891 Long term (current) use of opiate analgesic: Secondary | ICD-10-CM

## 2023-06-20 DIAGNOSIS — G8929 Other chronic pain: Secondary | ICD-10-CM

## 2023-06-20 DIAGNOSIS — M25562 Pain in left knee: Secondary | ICD-10-CM

## 2023-06-20 DIAGNOSIS — M5416 Radiculopathy, lumbar region: Secondary | ICD-10-CM | POA: Diagnosis not present

## 2023-06-20 DIAGNOSIS — Z419 Encounter for procedure for purposes other than remedying health state, unspecified: Secondary | ICD-10-CM | POA: Diagnosis not present

## 2023-06-20 MED ORDER — HYDROCODONE-ACETAMINOPHEN 5-325 MG PO TABS: 1.0000 | ORAL_TABLET | Freq: Four times a day (QID) | ORAL | 0 refills | Status: DC | PRN

## 2023-06-20 NOTE — Progress Notes (Signed)
Pt walk in complaining of itching at implant site.   Pt is 2 months s/p implant.  No redness, edema or sign of infection.  Advised Pt ok at this point to use some ointment for itching.  Pt reassured.

## 2023-06-20 NOTE — Progress Notes (Signed)
Subjective:    Patient ID: [name], female    DOB: May 13, 1966, 57 y.o.   MRN: [id number]  HPI: [name] is a 57 y.o. female who returns for follow up appointment for chronic pain and medication refill. She states her  pain is located in her.lower back radiating into her bilateral lower extremities and bilateral Knee pain L>R. She  rates her pain 8. Her current exercise regime is walking and performing stretching exercises.  Ms. Steury Morphine equivalent is 20.00 MME.   Last UDS was Performed on 03/15/2023, it was consistent.      Pain Inventory Average Pain 8 Pain Right Now 8 My pain is sharp, stabbing, and aching  In the last 24 hours, has pain interfered with the following? General activity 8 Relation with others 8 Enjoyment of life 8 What TIME of day is your pain at its worst? varies Sleep (in general) Poor  Pain is worse with: walking, bending, sitting, and some activites Pain improves with: rest, medication, and TENS Relief from Meds: 6  Family History  Problem Relation Age of Onset   Colon cancer Mother        mid-50s, died of metastatic disease   Cancer Mother        liver   Coronary artery disease Mother    Diabetes type I Mother    Peripheral vascular disease Mother        Carotid disease in her 77s   Coronary artery disease Father        CAD in his 30s   Diabetes Father    Diabetes type I Father    Hypertension Father    Bipolar disorder Sister    Coronary artery disease Brother        MI in his 4s   Hypertension Brother    Hypertension Maternal Grandmother    Hyperlipidemia Maternal Grandmother    Stroke Maternal Grandmother    Hypertension Maternal Grandfather    Diabetes Maternal Grandfather    Diabetes Paternal Grandmother    Heart disease Paternal Grandfather    Social History   Socioeconomic History   Marital status: Married    Spouse name: Not on file   Number of children: 3   Years of education: Not on file   Highest  education level: Some college, no degree  Occupational History   Occupation: child care    Employer: [employer]  Tobacco Use   Smoking status: Never   Smokeless tobacco: Never  Vaping Use   Vaping status: Never Used  Substance and Sexual Activity   Alcohol use: No    Alcohol/week: 0.0 standard drinks of alcohol   Drug use: No   Sexual activity: Yes    Partners: Male    Birth control/protection: Post-menopausal  Other Topics Concern   Not on file  Social History Narrative   Not on file   Social Determinants of Health   Financial Resource Strain: Medium Risk (03/11/2023)   Overall Financial Resource Strain (CARDIA)    Difficulty of Paying Living Expenses: Somewhat hard  Food Insecurity: Food Insecurity Present (03/11/2023)   Hunger Vital Sign    Worried About Running Out of Food in the Last Year: Sometimes true    Ran Out of Food in the Last Year: Sometimes true  Transportation Needs: No Transportation Needs (03/11/2023)   PRAPARE - Administrator, Civil Service (Medical): No    Lack of Transportation (Non-Medical): No  Physical Activity: Unknown (03/11/2023)  Exercise Vital Sign    Days of Exercise per Week: Patient declined    Minutes of Exercise per Session: Not on file  Stress: Stress Concern Present (03/11/2023)   [employer] of Occupational Health - Occupational Stress Questionnaire    Feeling of Stress : To some extent  Social Connections: Moderately Isolated (03/11/2023)   Social Connection and Isolation Panel [NHANES]    Frequency of Communication with Friends and Family: More than three times a week    Frequency of Social Gatherings with Friends and Family: Twice a week    Attends Religious Services: Never    Database administrator or Organizations: No    Attends Engineer, structural: Not on file    Marital Status: Married   Past Surgical History:  Procedure Laterality Date   back surg x2     BALLOON DILATION N/A 09/22/2021    Procedure: BALLOON DILATION;  Surgeon: Lanelle Bal, DO;  [facility];  Service: Endoscopy;  Laterality: N/A;   BIOPSY  09/22/2021   Procedure: BIOPSY;  Surgeon: Lanelle Bal, DO;  [facility]: [facility];  Service: Endoscopy;;   BIV ICD INSERTION CRT-D N/A 04/23/2023   Procedure: BIV ICD INSERTION CRT-D;  Surgeon: Maurice Small, MD;  Location: [facility] INVASIVE CV LAB;  Service: Cardiovascular;  Laterality: N/A;   BREAST LUMPECTOMY Right 2012   CHOLECYSTECTOMY     COLONOSCOPY  11/03/07   4-mm sessile polyp removed/small internal hemorrhoids/tubular adenoma, random colon bx negative for microscopic colitis   COLONOSCOPY WITH PROPOFOL N/A 09/17/2016   Grade 2 hemorrhoids, colonic diverticulosis, ascending colon polyp (sessile serrated adenoma), 5 year surveillance   COLONOSCOPY WITH PROPOFOL N/A 09/22/2021   Procedure: COLONOSCOPY WITH PROPOFOL;  Surgeon: Lanelle Bal, DO;  [facility]: [facility];  Service: Endoscopy;  Laterality: N/A;  7:30am   COLONOSCOPY, ESOPHAGOGASTRODUODENOSCOPY (EGD) AND ESOPHAGEAL DILATION  01/2012   mild gastritis s/p biopsy. Empiric dilation. Normal duodenum.    DILATATION & CURETTAGE/HYSTEROSCOPY WITH MYOSURE N/A 02/10/2015   Procedure: DILATATION & CURETTAGE/HYSTEROSCOPY WITH MYOSURE;  Surgeon: Patton Salles, MD;  Location: [facility];  Service: Gynecology;  Laterality: N/A;   ESOPHAGOGASTRODUODENOSCOPY (EGD) WITH PROPOFOL N/A 01/11/2020   Normal esophagus, s/p dilation, normal stomach, normal duodenum.    ESOPHAGOGASTRODUODENOSCOPY (EGD) WITH PROPOFOL N/A 09/22/2021   Procedure: ESOPHAGOGASTRODUODENOSCOPY (EGD) WITH PROPOFOL;  Surgeon: Lanelle Bal, DO;  [facility];  Service: Endoscopy;  Laterality: N/A;   MALONEY DILATION N/A 01/11/2020   Procedure: Elease Hashimoto DILATION;  Surgeon: Corbin Ade, MD;  [facility];  Service: Endoscopy;  Laterality: N/A;   MM BREAST STEREO BX*L*R/S     rt.   POLYPECTOMY   09/17/2016   Procedure: POLYPECTOMY;  Surgeon: Corbin Ade, MD;  [facility];  Service: Endoscopy;;  ascending colon   POLYPECTOMY  09/22/2021   Procedure: POLYPECTOMY;  Surgeon: Lanelle Bal, DO;  [facility];  Service: Endoscopy;;   PORT-A-CATH REMOVAL  05/26/2012   Procedure: REMOVAL PORT-A-CATH;  Surgeon: [name], MD;  Location: [facility];  Service: General;  Laterality: N/A;  Minor Room   PORTACATH PLACEMENT     Past Surgical History:  Procedure Laterality Date   back surg x2     BALLOON DILATION N/A 09/22/2021   Procedure: BALLOON DILATION;  Surgeon: Lanelle Bal, DO;  [facility];  Service: Endoscopy;  Laterality: N/A;   BIOPSY  09/22/2021  Procedure: BIOPSY;  Surgeon: Lanelle Bal, DO;  [facility];  Service: Endoscopy;;   BIV ICD INSERTION CRT-D N/A 04/23/2023   Procedure: BIV ICD INSERTION CRT-D;  Surgeon: Maurice Small, MD;  Location: [facility] INVAS[facility] CV LAB;  Service: Cardiovascular;  Laterality: N/A;   BREAST LUMPECTOMY Right 2012   CHOLECYSTECTOMY     COLONOSCOPY  11/03/07   4-mm sessile polyp removed/small internal hemorrhoids/tubular adenoma, random colon bx negative for microscopic colitis   COLONOSCOPY WITH PROPOFOL N/A 09/17/2016   Grade 2 hemorrhoids, colonic diverticulosis, ascending colon polyp (sessile serrated adenoma), 5 year surveillance   COLONOSCOPY WITH PROPOFOL N/A 09/22/2021   Procedure: COLONOSCOPY WITH PROPOFOL;  Surgeon: Lanelle Bal, DO;  [facility];  Service: Endoscopy;  Laterality: N/A;  7:30am   COLONOSCOPY, ESOPHAGOGASTRODUODENOSCOPY (EGD) AND ESOPHAGEAL DILATION  01/2012   mild gastritis s/p biopsy. Empiric dilation. Normal duodenum.    DILATATION & CURETTAGE/HYSTEROSCOPY WITH MYOSURE N/A 02/10/2015   Procedure: DILATATION & CURETTAGE/HYSTEROSCOPY WITH MYOSURE;  Surgeon: Patton Salles, MD;  Location: [facility];  Service: Gynecology;  Laterality: N/A;    ESOPHAGOGASTRODUODENOSCOPY (EGD) WITH PROPOFOL N/A 01/11/2020   Normal esophagus, s/p dilation, normal stomach, normal duodenum.    ESOPHAGOGASTRODUODENOSCOPY (EGD) WITH PROPOFOL N/A 09/22/2021   Procedure: ESOPHAGOGASTRODUODENOSCOPY (EGD) WITH PROPOFOL;  Surgeon: Lanelle Bal, DO;  [facility];  Service: Endoscopy;  Laterality: N/A;   MALONEY DILATION N/A 01/11/2020   Procedure: Elease Hashimoto DILATION;  Surgeon: Corbin Ade, MD;  [facility];  Service: Endoscopy;  Laterality: N/A;   MM BREAST STEREO BX*L*R/S     rt.   POLYPECTOMY  09/17/2016   Procedure: POLYPECTOMY;  Surgeon: Corbin Ade, MD;  [facility];  Service: Endoscopy;;  ascending colon   POLYPECTOMY  09/22/2021   Procedure: POLYPECTOMY;  Surgeon: Lanelle Bal, DO;  [facility];  Service: Endoscopy;;   PORT-A-CATH REMOVAL  05/26/2012   Procedure: REMOVAL PORT-A-CATH;  Surgeon: [name], MD;  Location: [facility];  Service: General;  Laterality: N/A;  Minor Room   PORTACATH PLACEMENT     Past Medical History:  Diagnosis Date   Anxiety    Arthritis    Blood transfusion without reported diagnosis 1999   due to heavy menses   Breast cancer (HCC) 05/22/2011   03/01/11, Stage 2, s/p lumpectomy, chemo/xrt   CHF (congestive heart failure) (HCC)    Complication of anesthesia    pt woke up during procedure   DDD (degenerative disc disease), lumbar    Depression 06/22/2011   Diverticulitis    DM (diabetes mellitus) (HCC) 06/22/2011   Genital warts    GERD (gastroesophageal reflux disease) 06/22/2011   Hx of adenomatous colonic polyps 10/2007   4mm sigmoid tubular adenoma, FH colon cancer, mother in mid-50s   Hypercholesterolemia 06/22/2011   Hypertension 06/22/2011   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   Iron deficiency anemia 03/25/2015   LBBB (left bundle branch block)    Mild CAD    Morbid obesity (HCC)    NICM (nonischemic cardiomyopathy) (HCC)     Personal history of chemotherapy    Personal history of radiation therapy    PSVT (paroxysmal supraventricular tachycardia)    Shortness of breath    STD (sexually transmitted disease)    HPV, 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   Syncope    Syncope    Vaginal bleeding 03/08/2014   BP 119/79  Pulse 88   Ht 5\' 3"  (1.6 m)   Wt 234 lb 12.8 oz (106.5 kg)   LMP 04/05/2016 Comment: spotting 11/2016 and 12/2016  SpO2 94%   BMI 41.59 kg/m   Opioid Risk Score:   Fall Risk Score:  `1  Depression screen [facility] 2/9     03/15/2023   12:47 PM 03/14/2023    3:16 PM 12/17/2022   12:54 PM 10/20/2021    3:10 PM 07/14/2021    3:19 PM 05/05/2021    2:37 PM 04/14/2021    1:11 PM  Depression screen PHQ 2/9  Decreased Interest 1 1 0 3 1 3 2   Down, Depressed, Hopeless 1 1 0 3 1 3 2   PHQ - 2 Score 2 2 0 6 2 6 4   Altered sleeping  2    1 2   Tired, decreased energy  2    3 2   Change in appetite  0    1 1  Feeling bad or failure about yourself   0    1 1  Trouble concentrating  2    2 3   Moving slowly or fidgety/restless  0    1 3  Suicidal thoughts  0    1 0  PHQ-9 Score  8    16 16   Difficult doing work/chores  Somewhat difficult     Extremely dIfficult    Review of Systems  Gastrointestinal:  Positive for abdominal pain.  Musculoskeletal:  Positive for back pain.       B/L knee pain  All other systems reviewed and are negative.     Objective:   Physical Exam Vitals and nursing note reviewed.  Constitutional:      Appearance: Normal appearance.  Cardiovascular:     Rate and Rhythm: Normal rate and regular rhythm.     Pulses: Normal pulses.     Heart sounds: Normal heart sounds.  Musculoskeletal:     Cervical back: Normal range of motion and neck supple.     Comments: Normal Muscle Bulk and Muscle Testing Reveals:  Upper Extremities:Right: Full  ROM and Muscle Strength 5/5 Left Upper extremity: Decreased ROM 45 Degrees and Muscle Strength 5/5 Lumbar Hypersensitivity Lower Extremities: Full  ROM and Muscle Strength 5/5 Arises from Table slowly Narrow Based  Gait     Skin:    General: Skin is warm and dry.  Neurological:     Mental Status: She is alert and oriented to person, place, and time.  Psychiatric:        Mood and Affect: Mood normal.        Behavior: Behavior normal.         Assessment & Plan:  Lumbar Radiculitis: Continue HEP as Tolerated. Continue current Medication Regimen. Continue to Monitor. Chronic Bilateral Knee Pain: L>R: She states Ortho Following Continue to Monitor.  Chronic Pain Syndrome: Refilled: Hydrocodone 5/325 mg  one tablet every 6 hors as needed for pain #120. Seecond script sent to accommodate scheduled appointment. We will continue the opioid monitoring program, this consists of regular clinic visits, examinations, urine drug screen, pill counts as well as use of [place] Controlled Substance Reporting system. A 12 month History has been reviewed on the [place] Controlled Substance Reporting System on 06/20/2023 F/U with Dr. Berline Chough in 2 months

## 2023-06-22 ENCOUNTER — Other Ambulatory Visit: Payer: Self-pay | Admitting: Family Medicine

## 2023-06-22 DIAGNOSIS — J302 Other seasonal allergic rhinitis: Secondary | ICD-10-CM

## 2023-06-25 ENCOUNTER — Inpatient Hospital Stay: Payer: Medicaid Other | Admitting: Oncology

## 2023-06-26 ENCOUNTER — Emergency Department (HOSPITAL_COMMUNITY)
Admission: EM | Admit: 2023-06-26 | Discharge: 2023-06-27 | Disposition: A | Discharge status: Home / Self Care | Payer: Medicaid Other | Attending: Emergency Medicine | Admitting: Emergency Medicine

## 2023-06-26 ENCOUNTER — Emergency Department (HOSPITAL_COMMUNITY): Payer: Medicaid Other

## 2023-06-26 ENCOUNTER — Other Ambulatory Visit: Payer: Self-pay

## 2023-06-26 ENCOUNTER — Encounter: Payer: Self-pay | Admitting: Registered Nurse

## 2023-06-26 ENCOUNTER — Encounter (HOSPITAL_COMMUNITY): Payer: Self-pay | Admitting: *Deleted

## 2023-06-26 ENCOUNTER — Ambulatory Visit (INDEPENDENT_AMBULATORY_CARE_PROVIDER_SITE_OTHER): Payer: Medicaid Other

## 2023-06-26 ENCOUNTER — Ambulatory Visit
Admission: EM | Admit: 2023-06-26 | Discharge: 2023-06-26 | Disposition: A | Discharge status: Home / Self Care | Payer: Medicaid Other | Attending: Nurse Practitioner | Admitting: Nurse Practitioner

## 2023-06-26 DIAGNOSIS — N10 Acute tubulo-interstitial nephritis: Secondary | ICD-10-CM

## 2023-06-26 DIAGNOSIS — R109 Unspecified abdominal pain: Secondary | ICD-10-CM

## 2023-06-26 DIAGNOSIS — R1031 Right lower quadrant pain: Secondary | ICD-10-CM | POA: Insufficient documentation

## 2023-06-26 DIAGNOSIS — Z7901 Long term (current) use of anticoagulants: Secondary | ICD-10-CM | POA: Diagnosis not present

## 2023-06-26 DIAGNOSIS — N3000 Acute cystitis without hematuria: Secondary | ICD-10-CM

## 2023-06-26 DIAGNOSIS — E119 Type 2 diabetes mellitus without complications: Secondary | ICD-10-CM | POA: Diagnosis not present

## 2023-06-26 DIAGNOSIS — Z853 Personal history of malignant neoplasm of breast: Secondary | ICD-10-CM | POA: Insufficient documentation

## 2023-06-26 DIAGNOSIS — I10 Essential (primary) hypertension: Secondary | ICD-10-CM | POA: Insufficient documentation

## 2023-06-26 DIAGNOSIS — Z7984 Long term (current) use of oral hypoglycemic drugs: Secondary | ICD-10-CM | POA: Insufficient documentation

## 2023-06-26 DIAGNOSIS — R079 Chest pain, unspecified: Secondary | ICD-10-CM

## 2023-06-26 DIAGNOSIS — Z95810 Presence of automatic (implantable) cardiac defibrillator: Secondary | ICD-10-CM | POA: Insufficient documentation

## 2023-06-26 DIAGNOSIS — I2510 Atherosclerotic heart disease of native coronary artery without angina pectoris: Secondary | ICD-10-CM | POA: Diagnosis not present

## 2023-06-26 DIAGNOSIS — R0789 Other chest pain: Secondary | ICD-10-CM | POA: Diagnosis not present

## 2023-06-26 DIAGNOSIS — N200 Calculus of kidney: Secondary | ICD-10-CM | POA: Diagnosis not present

## 2023-06-26 DIAGNOSIS — Z9049 Acquired absence of other specified parts of digestive tract: Secondary | ICD-10-CM | POA: Diagnosis not present

## 2023-06-26 DIAGNOSIS — R1011 Right upper quadrant pain: Secondary | ICD-10-CM | POA: Diagnosis not present

## 2023-06-26 DIAGNOSIS — I700 Atherosclerosis of aorta: Secondary | ICD-10-CM | POA: Diagnosis not present

## 2023-06-26 LAB — BASIC METABOLIC PANEL
Anion gap: 13 (ref 5–15)
BUN: 9 mg/dL (ref 6–20)
CO2: 24 mmol/L (ref 22–32)
Calcium: 8.7 mg/dL — ABNORMAL LOW (ref 8.9–10.3)
Chloride: 99 mmol/L (ref 98–111)
Creatinine, Ser: 0.87 mg/dL (ref 0.44–1.00)
GFR, Estimated: >60 mL/min (ref >60)
Glucose, Bld: 93 mg/dL (ref 70–99)
Potassium: 3.4 mmol/L — ABNORMAL LOW (ref 3.5–5.1)
Sodium: 136 mmol/L (ref 135–145)

## 2023-06-26 LAB — POCT URINALYSIS DIP (MANUAL ENTRY)
Bilirubin, UA: NEGATIVE
Blood, UA: NEGATIVE
Glucose, UA: >=1000 mg/dL — AB
Ketones, POC UA: NEGATIVE mg/dL
Nitrite, UA: POSITIVE — AB
Protein Ur, POC: NEGATIVE mg/dL
Spec Grav, UA: 1.02 (ref 1.010–1.025)
Urobilinogen, UA: 0.2 E.U./dL
pH, UA: 5.5 (ref 5.0–8.0)

## 2023-06-26 LAB — CBC
HCT: 45 % (ref 36.0–46.0)
Hemoglobin: 14 g/dL (ref 12.0–15.0)
MCH: 27.6 pg (ref 26.0–34.0)
MCHC: 31.1 g/dL (ref 30.0–36.0)
MCV: 88.6 fL (ref 80.0–100.0)
Platelets: 194 10*3/uL (ref 150–400)
RBC: 5.08 MIL/uL (ref 3.87–5.11)
RDW: 15.9 % — ABNORMAL HIGH (ref 11.5–15.5)
WBC: 7.3 10*3/uL (ref 4.0–10.5)
nRBC: 0 % (ref 0.0–0.2)

## 2023-06-26 NOTE — ED Notes (Signed)
Patient is being discharged from the Urgent Care and sent to the Emergency Department via POV . Per NP, patient is in need of higher level of care due to hypotension/pyelonephritis/pallor. Patient is aware and verbalizes understanding of plan of care.  Vitals:   06/26/23 1913  BP: (!) 85/56  Pulse: 84  Resp: 13  Temp: 98.9 F (37.2 C)  SpO2: 95%    Pt reported daughter was coming to drive her to ED. pt refused EMS.

## 2023-06-26 NOTE — ED Provider Notes (Signed)
[facility]    CSN: [id number] Arrival date & time: 06/26/23  1814      History   Chief Complaint No chief complaint on file.   HPI [name] is a 57 y.o. female.   The history is provided by the patient.   The patient presents for complaints of pain in her right side has been present for the past week.  Patient states pain starts under the right breast and radiates into the right thoracic spine.  Patient denies injury, trauma, fall, shortness of breath, difficulty breathing, rash, bruising, swelling, redness to the site, or urinary symptoms..  Patient states that she has been taking her normal pain medication.  Patient reports that pain worsens when she takes a deep breath in.  During triage, patient is hypotensive, 85/56.  Patient states that she did not know that her blood pressure was low.  She denies chest pain, dizziness, or lightheadedness. Past Medical History:  Diagnosis Date   Anxiety    Arthritis    Blood transfusion without reported diagnosis 1999   due to heavy menses   Breast cancer (HCC) 05/22/2011   03/01/11, Stage 2, s/p lumpectomy, chemo/xrt   CHF (congestive heart failure) (HCC)    Complication of anesthesia    pt woke up during procedure   DDD (degenerative disc disease), lumbar    Depression 06/22/2011   Diverticulitis    DM (diabetes mellitus) (HCC) 06/22/2011   Genital warts    GERD (gastroesophageal reflux disease) 06/22/2011   Hx of adenomatous colonic polyps 10/2007   4mm sigmoid tubular adenoma, FH colon cancer, mother in mid-50s   Hypercholesterolemia 06/22/2011   Hypertension 06/22/2011   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   Iron deficiency anemia 03/25/2015   LBBB (left bundle branch block)    Mild CAD    Morbid obesity (HCC)    NICM (nonischemic cardiomyopathy) (HCC)    Personal history of chemotherapy    Personal history of radiation therapy    PSVT (paroxysmal supraventricular tachycardia)     Shortness of breath    STD (sexually transmitted disease)    HPV, 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   Syncope    Syncope    Vaginal bleeding 03/08/2014    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   Fatty liver 09/06/2016   H/O adenomatous polyp of colon 01/24/2012   FH: colon cancer 01/24/2012   GERD (gastroesophageal reflux disease) 06/22/2011   Hyperlipidemia 06/22/2011   Essential hypertension 06/22/2011   Obesity 06/22/2011   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    Past Surgical History:  Procedure Laterality Date   back surg x2     BALLOON DILATION N/A 09/22/2021   Procedure: BALLOON DILATION;  Surgeon: Lanelle Bal, DO;  Location: [facility];  Service: Endoscopy;  Laterality: N/A;   BIOPSY  09/22/2021   Procedure: BIOPSY;  Surgeon: Lanelle Bal, DO;  Location: [facility];  Service: Endoscopy;;   BIV ICD INSERTION CRT-D N/A 04/23/2023   Procedure: BIV ICD INSERTION CRT-D;  Surgeon:  Mealor, Roberts Gaudy, MD;  Location: [facility];  Service: Cardiovascular;  Laterality: N/A;   BREAST LUMPECTOMY Right 2012   CHOLECYSTECTOMY     COLONOSCOPY  11/03/07   4-mm sessile polyp removed/small internal hemorrhoids/tubular adenoma, random colon bx negative for microscopic colitis   COLONOSCOPY WITH PROPOFOL N/A 09/17/2016   Grade 2 hemorrhoids, colonic diverticulosis, ascending colon polyp (sessile serrated adenoma), 5 year surveillance   COLONOSCOPY WITH PROPOFOL N/A 09/22/2021   Procedure: COLONOSCOPY  WITH PROPOFOL;  Surgeon: Lanelle Bal, DO;  Location: [facility];  Service: Endoscopy;  Laterality: N/A;  7:30am   COLONOSCOPY, ESOPHAGOGASTRODUODENOSCOPY (EGD) AND ESOPHAGEAL DILATION  01/2012   mild gastritis s/p biopsy. Empiric dilation. Normal duodenum.    DILATATION & CURETTAGE/HYSTEROSCOPY WITH MYOSURE N/A 02/10/2015   Procedure: DILATATION & CURETTAGE/HYSTEROSCOPY WITH MYOSURE;  Surgeon: Patton Salles, MD;  Location: [facility];  Service: Gynecology;  Laterality: N/A;   ESOPHAGOGASTRODUODENOSCOPY (EGD) WITH PROPOFOL N/A 01/11/2020   Normal esophagus, s/p dilation, normal stomach, normal duodenum.    ESOPHAGOGASTRODUODENOSCOPY (EGD) WITH PROPOFOL N/A 09/22/2021   Procedure: ESOPHAGOGASTRODUODENOSCOPY (EGD) WITH PROPOFOL;  Surgeon: Lanelle Bal, DO;  [facility]: [facility];  Service: Endoscopy;  Laterality: N/A;   MALONEY DILATION N/A 01/11/2020   Procedure: Elease Hashimoto DILATION;  Surgeon: Corbin Ade, MD;  Location: [facility];  Service: Endoscopy;  Laterality: N/A;   MM BREAST STEREO BX*L*R/S     rt.   POLYPECTOMY  09/17/2016   Procedure: POLYPECTOMY;  Surgeon: Corbin Ade, MD;  [facility]: [facility];  Service: Endoscopy;;  ascending colon   POLYPECTOMY  09/22/2021   Procedure: POLYPECTOMY;  Surgeon: Lanelle Bal, DO;  Location: [facility];  Service: Endoscopy;;   PORT-A-CATH REMOVAL  05/26/2012   Procedure: REMOVAL PORT-A-CATH;  Surgeon: [name], MD;  Location: [facility];  Service: General;  Laterality: N/A;  Minor Room   PORTACATH PLACEMENT      OB History     Gravida  4   Para  3   Term  0   Preterm  0   AB  1   Living  3      SAB  0   IAB  0   Ectopic  0   Multiple  0   Live Births  3            Home Medications    Prior to Admission medications   Medication Sig Start Date End Date Taking? Authorizing Provider  apixaban (ELIQUIS) 5 MG TABS tablet Take 1 tablet (5 mg total) by mouth 2 (two) times daily. 05/02/23    Duke Salvia, MD  atorvastatin (LIPITOR) 40 MG tablet TAKE 1 TABLET BY MOUTH EVERYDAY AT BEDTIME 04/11/23   Cook, Pacifica G, DO  blood glucose meter kit and supplies Dispense based on patient and insurance preference. Use up to four times daily as directed. (FOR ICD-10 E10.9, E11.9). 01/05/20   Merlyn Albert, MD  Blood Pressure KIT 1 Units by Does not apply route daily. 02/28/23   Mallipeddi, Vishnu P, MD  cetirizine (ZYRTEC) 10 MG tablet TAKE 1 TABLET BY MOUTH EVERY DAY 03/25/23   Everlene Other G, DO  citalopram (CELEXA) 40 MG tablet TAKE 1 TABLET BY MOUTH EVERY DAY 05/08/23   Everlene Other G, DO  Clindamycin Phosphate, 1 Dose, (CLINDESSE) vaginal cream Apply 1 applicator (100 mg) per vagina once at bedtime. 03/19/23   Patton Salles, MD  Dulaglutide (TRULICITY) 0.75 MG/0.5ML SOPN INJECT 0.75 MG SUBCUTANEOUSLY ONCE A WEEK 04/29/23   Cook, Jayce G, DO  DULoxetine (CYMBALTA) 30 MG capsule Take 1 capsule (30 mg total) by mouth daily. 05/30/23   Lovorn, Aundra Millet, MD  empagliflozin (JARDIANCE) 10 MG TABS tablet Take 1 tablet (10 mg total) by mouth daily before breakfast. 01/02/23   Jake Bathe, MD  ENTRESTO 24-26 MG TAKE 1 TABLET BY MOUTH TWICE A DAY 06/10/23   Jake Bathe, MD  fluticasone (FLONASE) 50 MCG/ACT nasal spray USE 2 SPRAY(S) IN EACH NOSTRIL ONCE DAILY AS NEEDED 06/25/23   Tommie Sams, DO  HYDROcodone-acetaminophen (NORCO/VICODIN) 5-325 MG tablet Take 1 tablet by mouth every 6 (six) hours as needed for moderate pain. Do Not Fill Before 07/27/2023 06/20/23   Jones Bales, NP  lidocaine (XYLOCAINE) 5 % ointment Apply 1 Application topically 4 (four) times daily as needed for mild pain (back or hands). 12/17/22   Lovorn, Aundra Millet, MD  metFORMIN (GLUCOPHAGE-XR) 750 MG 24 hr tablet TAKE 1 TABLET BY MOUTH EVERY DAY WITH BREAKFAST 04/11/23   Everlene Other G, DO  metoprolol succinate (TOPROL XL) 50 MG 24 hr tablet Take 1 tablet (50 mg total) by mouth daily. 12/04/22   Jake Bathe, MD  Multiple  Vitamin (MULTIVITAMIN) capsule Take 1 capsule by mouth daily.    [provider]  nystatin (MYCOSTATIN/NYSTOP) powder APPLY 1 APPLICATION TOPICALLY 3 (THREE) TIMES DAILY. APPLY TO AFFECTED AREA FOR UP TO 7 DAYS 04/03/23   Ardell Isaacs, Forrestine Him, MD  Omega-3 Fatty Acids (FISH OIL) 1000 MG CAPS Take 1,000 mg by mouth daily.    [provider]  ondansetron (ZOFRAN-ODT) 8 MG disintegrating tablet DISSOLVE 1 TABLET IN MOUTH EVERY 8 HOURS AS NEEDED FOR NAUSEA OR VOMITING. 06/17/23   Everlene Other G, DO  pantoprazole (PROTONIX) 40 MG tablet TAKE 1 TABLET (40 MG TOTAL) BY MOUTH TWICE A DAY BEFORE MEALS 01/30/23   Gelene Mink, NP  Vitamin D, Ergocalciferol, (DRISDOL) 1.25 MG (50000 UNIT) CAPS capsule TAKE 1 CAPSULE BY MOUTH ONE TIME PER WEEK 04/30/23   Doreatha Massed, MD    Family History Family History  Problem Relation Age of Onset   Colon cancer Mother        mid-50s, died of metastatic disease   Cancer Mother        liver   Coronary artery disease Mother    Diabetes type I Mother    Peripheral vascular disease Mother        Carotid disease in her 52s   Coronary artery disease Father        CAD in his 73s   Diabetes Father    Diabetes type I Father    Hypertension Father    Bipolar disorder Sister    Coronary artery disease Brother        MI in his 72s   Hypertension Brother    Hypertension Maternal Grandmother    Hyperlipidemia Maternal Grandmother    Stroke Maternal Grandmother    Hypertension Maternal Grandfather    Diabetes Maternal Grandfather    Diabetes Paternal Grandmother    Heart disease Paternal Grandfather     Social History Social History   Tobacco Use   Smoking status: Never   Smokeless tobacco: Never  Vaping Use   Vaping status: Never Used  Substance Use Topics   Alcohol use: No    Alcohol/week: 0.0 standard drinks of alcohol   Drug use: No  Allergies   Flagyl [metronidazole], Lansoprazole, and Pravastatin   Review of  Systems Review of Systems Per HPI  Physical Exam Triage Vital Signs ED Triage Vitals  Encounter Vitals Group     BP 06/26/23 1913 (!) 85/56     Systolic BP Percentile --      Diastolic BP Percentile --      Pulse Rate 06/26/23 1913 84     Resp 06/26/23 1913 13     Temp 06/26/23 1913 98.9 F (37.2 C)     Temp Source 06/26/23 1913 Oral     SpO2 06/26/23 1913 95 %     Weight --      Height --      Head Circumference --      Peak Flow --      Pain Score 06/26/23 1918 9     Pain Loc --      Pain Education --      Exclude from Growth Chart --    Orthostatic VS for the past 24 hrs:  BP- Lying Pulse- Lying BP- Sitting Pulse- Sitting BP- Standing at 0 minutes Pulse- Standing at 0 minutes  06/26/23 1920 (!) 71/49 80 92/56 82 (!) 86/54 54    Updated Vital Signs BP (!) 85/56 (BP Location: Left Arm)   Pulse 84   Temp 98.9 F (37.2 C) (Oral)   Resp 13   LMP 04/05/2016 Comment: spotting 11/2016 and 12/2016  SpO2 95%   Visual Acuity Right Eye Distance:   Left Eye Distance:   Bilateral Distance:    Right Eye Near:   Left Eye Near:    Bilateral Near:     Physical Exam Vitals and nursing note reviewed.  Constitutional:      General: She is not in acute distress.    Appearance: Normal appearance.  HENT:     Head: Normocephalic.  Eyes:     Extraocular Movements: Extraocular movements intact.     Conjunctiva/sclera: Conjunctivae normal.     Pupils: Pupils are equal, round, and reactive to light.  Pulmonary:     Effort: Pulmonary effort is normal. No respiratory distress.     Breath sounds: Normal breath sounds. No stridor. No wheezing, rhonchi or rales.  Chest:     Chest wall: Tenderness (Right-sided chest wall tenderness at the right breast and radiates to the thoracic spine at T11-12) present.  Abdominal:     General: Bowel sounds are normal.     Palpations: Abdomen is soft.     Tenderness: There is no abdominal tenderness.  Neurological:     Mental Status: She is  alert.      UC Treatments / Results  Labs (all labs ordered are listed, but only abnormal results are displayed) Labs Reviewed  POCT URINALYSIS DIP (MANUAL ENTRY) - Abnormal; Notable for the following components:      Result Value   Clarity, UA hazy (*)    Glucose, UA >=1,000 (*)    Nitrite, UA Positive (*)    Leukocytes, UA Trace (*)    All other components within normal limits    EKG   Radiology DG Chest 2 View  Result Date: 06/26/2023 CLINICAL DATA:  Right-sided chest pain for 1 week. EXAM: CHEST - 2 VIEW COMPARISON:  04/23/2023 FINDINGS: Stable positioning of left-sided pacemaker.The cardiomediastinal contours are normal. The lungs are clear. Pulmonary vasculature is normal. No consolidation, pleural effusion, or pneumothorax. Thoracic spondylosis. No acute osseous abnormalities are seen. Right axillary surgical clips IMPRESSION: No acute  chest findings. Electronically Signed   By: Narda Rutherford M.D.   On: 06/26/2023 19:40    Procedures Procedures (including critical care time)  Medications Ordered in UC Medications - No data to display  Initial Impression / Assessment and Plan / UC Course  I have reviewed the triage vital signs and the nursing notes.  Pertinent labs & imaging results that were available during my care of the patient were reviewed by me and considered in my medical decision making (see chart for details).  Urinalysis showed patient has a UTI, there is concern that patient's UTI has progressed to pyelonephritis given her current presentation of hypotension.  Attempted to speak with patient regarding the diagnosis, as this provider was walking into the room, patient states "I do not feel right".  Discussed diagnosis with the patient and concern for possible sepsis, and advised patient that it is recommended that she go to the emergency department.  Patient patient is refusing EMS transport..  Patient called her daughter who will pick her up to go to the  emergency department.  Given patient's refusal to travel via EMS, she remains alert and oriented.  Patient to travel to [facility] emergency department via private vehicle.  Patient was given fluids prior to discharge, bp 112/72.  *** Final Clinical Impressions(s) / UC Diagnoses   Final diagnoses:  Right flank pain  Acute pyelonephritis   Discharge Instructions   None     ED Prescriptions   None    PDMP not reviewed this encounter.

## 2023-06-26 NOTE — Discharge Instructions (Addendum)
Go to the emergency department for further evaluation

## 2023-06-26 NOTE — ED Triage Notes (Signed)
Pt c/o right flank pain into the back breathing hurts,  lightheadedness, funny feeling x 2 days

## 2023-06-26 NOTE — ED Notes (Addendum)
Pt given 16 oz bottle of water per NP order. Will re-assess bp once water is finished.  Pt stated "I don't feel right, I don't feel like myself." NP aware and at bedside.

## 2023-06-26 NOTE — ED Triage Notes (Signed)
Pt in c/o RUQ pain and was seen at UC, pt hypotensive there with BP 70s, pt dx UTI, pt told "there was a concern that it went to my kidneys", pt A&O x4

## 2023-06-26 NOTE — ED Provider Triage Note (Signed)
Emergency Medicine Provider Triage Evaluation Note  [name] , a 57 y.o. female  was evaluated in triage.  Pt complains of right flank pain.  Pt seen at Urgent care and sent here for evaluation of uti/pyelonephritis.   Review of Systems  Positive: Flank pain  Negative: fever  Physical Exam  BP 115/67 (BP Location: Left Arm)   Pulse 78   Temp 98.5 F (36.9 C) (Oral)   Resp 17   Ht 5\' 4"  (1.626 m)   Wt 107.5 kg   LMP 04/05/2016 Comment: spotting 11/2016 and 12/2016  SpO2 96%   BMI 40.70 kg/m  Gen:   Awake, no distress   Resp:  Normal effort  MSK:   Moves extremities without difficulty  Other:    Medical Decision Making  Medically screening exam initiated at 9:20 PM.  Appropriate orders placed.  [name] was informed that the remainder of the evaluation will be completed by another provider, this initial triage assessment does not replace that evaluation, and the importance of remaining in the ED until their evaluation is complete.     Elson Areas, [place] 06/26/23 2122

## 2023-06-27 ENCOUNTER — Telehealth: Payer: Self-pay | Admitting: *Deleted

## 2023-06-27 ENCOUNTER — Emergency Department (HOSPITAL_COMMUNITY): Payer: Medicaid Other

## 2023-06-27 DIAGNOSIS — I700 Atherosclerosis of aorta: Secondary | ICD-10-CM | POA: Diagnosis not present

## 2023-06-27 DIAGNOSIS — I2510 Atherosclerotic heart disease of native coronary artery without angina pectoris: Secondary | ICD-10-CM | POA: Diagnosis not present

## 2023-06-27 LAB — URINALYSIS, MICROSCOPIC (REFLEX): WBC, UA: >50 WBC/hpf (ref 0–5)

## 2023-06-27 MED ORDER — HYDROCODONE-ACETAMINOPHEN 5-325 MG PO TABS: 1.0000 | ORAL_TABLET | Freq: Four times a day (QID) | ORAL | 0 refills | Status: DC | PRN

## 2023-06-27 MED ORDER — SODIUM CHLORIDE 0.9 % IV BOLUS
1000.0000 mL | Freq: Once | INTRAVENOUS | Status: AC
  Administered 2023-06-27: 1000 mL via INTRAVENOUS

## 2023-06-27 MED ORDER — IOHEXOL 350 MG/ML SOLN
100.0000 mL | Freq: Once | INTRAVENOUS | Status: AC | PRN
  Administered 2023-06-27: 100 mL via INTRAVENOUS

## 2023-06-27 MED ORDER — CEPHALEXIN 500 MG PO CAPS: 500.0000 mg | ORAL_CAPSULE | Freq: Three times a day (TID) | ORAL | 0 refills | Status: DC

## 2023-06-27 MED ORDER — CEPHALEXIN 500 MG PO CAPS
500.0000 mg | ORAL_CAPSULE | Freq: Once | ORAL | Status: AC
  Administered 2023-06-27: 500 mg via ORAL
  Filled 2023-06-27: qty 1

## 2023-06-27 NOTE — Telephone Encounter (Signed)
Patient has called around to all area pharmacies. Hydrocodone 5-325 mg is out of stock [facility], [facility], [facility]. The do have other strengths per patient.

## 2023-06-27 NOTE — Discharge Instructions (Addendum)
Begin taking Keflex as prescribed.  Drink plenty of fluids and get plenty of rest.  Begin taking hydrocodone as prescribed as needed for pain.  Follow-up with primary doctor if symptoms or not improving in the next week.

## 2023-06-27 NOTE — ED Notes (Signed)
Patient transported to CT 

## 2023-06-27 NOTE — ED Provider Notes (Signed)
[place] EMERGENCY DEPARTMENT AT [facility] Provider Note   CSN: [id number] Arrival date & time: 06/26/23  2025     History  Chief Complaint  Patient presents with   Abdominal Pain    [name] is a 57 y.o. female.  Patient is a 57 year old female with past medical history of breast cancer, hypertension, diabetes, nonischemic cardiomyopathy with pacer/defibrillator.  Patient presenting today with complaints of right-sided pain.  This has been present for the past week.  She describes pain that starts in her right lower lateral ribs and radiates to her back.  This pain has been constant and worse when she breathes or moves.  She denies fevers, chills, or productive cough.  She denies any bowel or bladder complaints.  Patient was seen at urgent care for this, then was found to have a low blood pressure, then was referred here for further evaluation.  The history is provided by the patient.       Home Medications Prior to Admission medications   Medication Sig Start Date End Date Taking? Authorizing Provider  apixaban (ELIQUIS) 5 MG TABS tablet Take 1 tablet (5 mg total) by mouth 2 (two) times daily. 05/02/23   Duke Salvia, MD  atorvastatin (LIPITOR) 40 MG tablet TAKE 1 TABLET BY MOUTH EVERYDAY AT BEDTIME 04/11/23   Cook, Parsons G, DO  blood glucose meter kit and supplies Dispense based on patient and insurance preference. Use up to four times daily as directed. (FOR ICD-10 E10.9, E11.9). 01/05/20   Merlyn Albert, MD  Blood Pressure KIT 1 Units by Does not apply route daily. 02/28/23   Mallipeddi, Vishnu P, MD  cetirizine (ZYRTEC) 10 MG tablet TAKE 1 TABLET BY MOUTH EVERY DAY 03/25/23   Everlene Other G, DO  citalopram (CELEXA) 40 MG tablet TAKE 1 TABLET BY MOUTH EVERY DAY 05/08/23   Everlene Other G, DO  Clindamycin Phosphate, 1 Dose, (CLINDESSE) vaginal cream Apply 1 applicator (100 mg) per vagina once at bedtime. 03/19/23   Patton Salles, MD  Dulaglutide  (TRULICITY) 0.75 MG/0.5ML SOPN INJECT 0.75 MG SUBCUTANEOUSLY ONCE A WEEK 04/29/23   Everlene Other G, DO  DULoxetine (CYMBALTA) 30 MG capsule Take 1 capsule (30 mg total) by mouth daily. 05/30/23   Lovorn, Aundra Millet, MD  empagliflozin (JARDIANCE) 10 MG TABS tablet Take 1 tablet (10 mg total) by mouth daily before breakfast. 01/02/23   Jake Bathe, MD  ENTRESTO 24-26 MG TAKE 1 TABLET BY MOUTH TWICE A DAY 06/10/23   Jake Bathe, MD  fluticasone (FLONASE) 50 MCG/ACT nasal spray USE 2 SPRAY(S) IN EACH NOSTRIL ONCE DAILY AS NEEDED 06/25/23   Tommie Sams, DO  HYDROcodone-acetaminophen (NORCO/VICODIN) 5-325 MG tablet Take 1 tablet by mouth every 6 (six) hours as needed for moderate pain. Do Not Fill Before 07/27/2023 06/20/23   Jones Bales, NP  lidocaine (XYLOCAINE) 5 % ointment Apply 1 Application topically 4 (four) times daily as needed for mild pain (back or hands). 12/17/22   Lovorn, Aundra Millet, MD  metFORMIN (GLUCOPHAGE-XR) 750 MG 24 hr tablet TAKE 1 TABLET BY MOUTH EVERY DAY WITH BREAKFAST 04/11/23   Everlene Other G, DO  metoprolol succinate (TOPROL XL) 50 MG 24 hr tablet Take 1 tablet (50 mg total) by mouth daily. 12/04/22   Jake Bathe, MD  Multiple Vitamin (MULTIVITAMIN) capsule Take 1 capsule by mouth daily.    [provider]  nystatin (MYCOSTATIN/NYSTOP) powder APPLY 1 APPLICATION TOPICALLY 3 (THREE)  TIMES DAILY. APPLY TO AFFECTED AREA FOR UP TO 7 DAYS 04/03/23   Ardell Isaacs, Forrestine Him, MD  Omega-3 Fatty Acids (FISH OIL) 1000 MG CAPS Take 1,000 mg by mouth daily.    [provider]  ondansetron (ZOFRAN-ODT) 8 MG disintegrating tablet DISSOLVE 1 TABLET IN MOUTH EVERY 8 HOURS AS NEEDED FOR NAUSEA OR VOMITING. 06/17/23   Everlene Other G, DO  pantoprazole (PROTONIX) 40 MG tablet TAKE 1 TABLET (40 MG TOTAL) BY MOUTH TWICE A DAY BEFORE MEALS 01/30/23   Gelene Mink, NP  Vitamin D, Ergocalciferol, (DRISDOL) 1.25 MG (50000 UNIT) CAPS capsule TAKE 1 CAPSULE BY MOUTH ONE TIME PER WEEK 04/30/23    Doreatha Massed, MD      Allergies    Flagyl [metronidazole], Lansoprazole, and Pravastatin    Review of Systems   Review of Systems  All other systems reviewed and are negative.   Physical Exam Updated Vital Signs BP 123/73   Pulse 91   Temp 98.3 F (36.8 C) (Oral)   Resp 20   Ht 5\' 4"  (1.626 m)   Wt 107.5 kg   LMP 04/05/2016 Comment: spotting 11/2016 and 12/2016  SpO2 98%   BMI 40.70 kg/m  Physical Exam Vitals and nursing note reviewed.  Constitutional:      General: She is not in acute distress.    Appearance: She is well-developed. She is not diaphoretic.  HENT:     Head: Normocephalic and atraumatic.  Cardiovascular:     Rate and Rhythm: Normal rate and regular rhythm.     Heart sounds: No murmur heard.    No friction rub. No gallop.  Pulmonary:     Effort: Pulmonary effort is normal. No respiratory distress.     Breath sounds: Normal breath sounds. No wheezing.  Abdominal:     General: Bowel sounds are normal. There is no distension.     Palpations: Abdomen is soft.     Tenderness: There is no abdominal tenderness.  Musculoskeletal:        General: Normal range of motion.     Cervical back: Normal range of motion and neck supple.  Skin:    General: Skin is warm and dry.  Neurological:     General: No focal deficit present.     Mental Status: She is alert and oriented to person, place, and time.     ED Results / Procedures / Treatments   Labs (all labs ordered are listed, but only abnormal results are displayed) Labs Reviewed  BASIC METABOLIC PANEL - Abnormal; Notable for the following components:      Result Value   Potassium 3.4 (*)    Calcium 8.7 (*)    All other components within normal limits  CBC - Abnormal; Notable for the following components:   RDW 15.9 (*)    All other components within normal limits  URINALYSIS, ROUTINE W REFLEX MICROSCOPIC - Abnormal; Notable for the following components:   Glucose, UA >=500 (*)    All other  components within normal limits  URINALYSIS, MICROSCOPIC (REFLEX) - Abnormal; Notable for the following components:   Bacteria, UA RARE (*)    All other components within normal limits    EKG None  Radiology CT Renal Stone Study  Result Date: 06/26/2023 CLINICAL DATA:  Right upper quadrant pain. EXAM: CT ABDOMEN AND PELVIS WITHOUT CONTRAST TECHNIQUE: Multidetector CT imaging of the abdomen and pelvis was performed following the standard protocol without IV contrast. RADIATION DOSE REDUCTION: This exam  was performed according to the departmental dose-optimization program which includes automated exposure control, adjustment of the mA and/or kV according to patient size and/or use of iterative reconstruction technique. COMPARISON:  January 04, 2021 FINDINGS: Lower chest: No acute abnormality. Hepatobiliary: No focal liver abnormality is seen. Status post cholecystectomy. No biliary dilatation. Pancreas: Unremarkable. No pancreatic ductal dilatation or surrounding inflammatory changes. Spleen: Normal in size without focal abnormality. Adrenals/Urinary Tract: Adrenal glands are unremarkable. Kidneys are normal, without obstructing renal calculi, focal lesion, or hydronephrosis. A punctate nonobstructing renal calculus is seen within the lower pole of the left kidney. Bladder is unremarkable. Stomach/Bowel: Stomach is within normal limits. The appendix is not visualized. No evidence of bowel wall thickening, distention, or inflammatory changes. Vascular/Lymphatic: Aortic atherosclerosis. No enlarged abdominal or pelvic lymph nodes. Reproductive: Uterus and bilateral adnexa are unremarkable. Other: No abdominal wall hernia or abnormality. No abdominopelvic ascites. Musculoskeletal: No acute or significant osseous findings. IMPRESSION: 1. Punctate nonobstructing left renal calculus. 2. Evidence of prior cholecystectomy. 3. Aortic atherosclerosis. Aortic Atherosclerosis (ICD10-I70.0). Electronically Signed   By:  Aram Candela M.D.   On: 06/26/2023 22:07   DG Chest 2 View  Result Date: 06/26/2023 CLINICAL DATA:  Right-sided chest pain for 1 week. EXAM: CHEST - 2 VIEW COMPARISON:  04/23/2023 FINDINGS: Stable positioning of left-sided pacemaker.The cardiomediastinal contours are normal. The lungs are clear. Pulmonary vasculature is normal. No consolidation, pleural effusion, or pneumothorax. Thoracic spondylosis. No acute osseous abnormalities are seen. Right axillary surgical clips IMPRESSION: No acute chest findings. Electronically Signed   By: Narda Rutherford M.D.   On: 06/26/2023 19:40    Procedures Procedures    Medications Ordered in ED Medications  sodium chloride 0.9 % bolus 1,000 mL (has no administration in time range)    ED Course/ Medical Decision Making/ A&P  Patient presenting with complaints of pain to the right lateral lower ribs.  This has been present for the past week.  Patient arrives here with stable vital signs and is afebrile.  Physical examination basically unremarkable.  Workup initiated including CBC, metabolic panel, and urinalysis.  Laboratory studies unremarkable, but UA does show greater than 50 white cells.  Chest x-ray shows no acute process and CTA of the chest shows no evidence for pulmonary embolism.  There was a mention of some coronary artery calcification, but patient tells me that this is known and has been followed for this before.  Patient seems appropriate for discharge.  I will treat with Keflex for the urinary tract infection and have her follow-up with primary doctor as needed.  Final Clinical Impression(s) / ED Diagnoses Final diagnoses:  None    Rx / DC Orders ED Discharge Orders     None         Geoffery Lyons, MD 06/27/23 [phone]

## 2023-06-27 NOTE — Telephone Encounter (Signed)
Call placed to [name], she was instructed to call pharmacy to see what strength of Hydrocodone they have in stock, she will call this provider with information. She verbalizes understanding.

## 2023-06-28 ENCOUNTER — Telehealth: Payer: Self-pay | Admitting: *Deleted

## 2023-06-28 ENCOUNTER — Telehealth: Payer: Self-pay | Admitting: Registered Nurse

## 2023-06-28 ENCOUNTER — Telehealth: Payer: Self-pay | Admitting: Internal Medicine

## 2023-06-28 NOTE — Telephone Encounter (Signed)
PA initiated Hydrocodone/Apap [name] (Key: [id number]) Rx #: [id number] Need Help? Call us at [phone] Status sent iconSent to Plan today

## 2023-06-28 NOTE — Telephone Encounter (Signed)
BP yesterday 123/73 at ER visit for abdominal pain  Left message to return call

## 2023-06-28 NOTE — Telephone Encounter (Signed)
Pt c/o BP issue: STAT if pt c/o blurred vision, one-sided weakness or slurred speech  1. What are your last 5 BP readings?   2. Are you having any other symptoms (ex. Dizziness, headache, blurred vision, passed out)?  Continuous pain underneath ribcage for about 1.5 weeks   3. What is your BP issue?   BP is low. Patient states her systolic recently got to 75 and would not get above 100. She does not have any readings available.

## 2023-06-28 NOTE — Telephone Encounter (Signed)
PMP was Reviewed [facility] called : No answer let message  [name] has called [facility] on yesterday and has left message with no return call.  Placed a call to [name], she was instructed to go to [facility] to see what the problem is, she verbalizes understanding.

## 2023-06-28 NOTE — Telephone Encounter (Signed)
Call placed to [facility] , pharmacist states she needs a PA. They will fillher Hydrocodone for  7 day prescription. Call placed to Ms. Nedra Hai, no answer. Left message to return the call.

## 2023-07-01 ENCOUNTER — Telehealth: Payer: Self-pay

## 2023-07-01 NOTE — Telephone Encounter (Signed)
Hydrocodone/APAP 5-325 approved 120 tabs per 30 days.

## 2023-07-01 NOTE — Telephone Encounter (Signed)
RN

## 2023-07-02 ENCOUNTER — Encounter: Payer: Medicaid Other | Admitting: Orthopaedic Surgery

## 2023-07-02 NOTE — Transitions of Care (Post Inpatient/ED Visit) (Signed)
   07/02/2023  Name: [name] MRN: [id number] DOB: November 10, 1966  Today's TOC FU Call Status:    Attempted to reach the patient regarding the most recent Inpatient/ED visit.  Follow Up Plan: Additional outreach attempts will be made to reach the patient to complete the Transitions of Care (Post Inpatient/ED visit) call.    J. Cristela Felt, RN, BSN, MSN Care Management Coordinator/[place] Phone Number:  [phone]

## 2023-07-04 ENCOUNTER — Inpatient Hospital Stay: Payer: Medicaid Other | Admitting: Family Medicine

## 2023-07-05 ENCOUNTER — Inpatient Hospital Stay: Payer: Medicaid Other | Admitting: Family Medicine

## 2023-07-09 ENCOUNTER — Other Ambulatory Visit: Payer: Self-pay | Admitting: Gastroenterology

## 2023-07-09 ENCOUNTER — Telehealth: Payer: Self-pay | Admitting: *Deleted

## 2023-07-09 MED ORDER — HYDROCODONE-ACETAMINOPHEN 5-325 MG PO TABS: 1.0000 | ORAL_TABLET | Freq: Four times a day (QID) | ORAL | 0 refills | Status: DC | PRN

## 2023-07-09 NOTE — Telephone Encounter (Signed)
PMP was Reviewed.  Hydrocodone e-scribed to pharmacy.

## 2023-07-09 NOTE — Telephone Encounter (Signed)
Patient requesting Hydrocodone 5-325 be sent to [facility].  [facility] strength [facility] back order.

## 2023-07-11 ENCOUNTER — Inpatient Hospital Stay: Payer: Medicaid Other | Admitting: Family Medicine

## 2023-07-18 ENCOUNTER — Encounter: Payer: Self-pay | Admitting: Nurse Practitioner

## 2023-07-18 ENCOUNTER — Ambulatory Visit (INDEPENDENT_AMBULATORY_CARE_PROVIDER_SITE_OTHER): Payer: Medicaid Other | Admitting: Nurse Practitioner

## 2023-07-18 VITALS — BP 118/70 | HR 85 | Temp 97.2°F | Ht 64.0 in | Wt 237.0 lb

## 2023-07-18 DIAGNOSIS — R5383 Other fatigue: Secondary | ICD-10-CM

## 2023-07-18 DIAGNOSIS — E876 Hypokalemia: Secondary | ICD-10-CM

## 2023-07-18 DIAGNOSIS — E119 Type 2 diabetes mellitus without complications: Secondary | ICD-10-CM

## 2023-07-18 DIAGNOSIS — Z79899 Other long term (current) drug therapy: Secondary | ICD-10-CM | POA: Diagnosis not present

## 2023-07-18 DIAGNOSIS — R1011 Right upper quadrant pain: Secondary | ICD-10-CM

## 2023-07-18 DIAGNOSIS — R350 Frequency of micturition: Secondary | ICD-10-CM

## 2023-07-18 LAB — POCT URINALYSIS DIP (CLINITEK)
Bilirubin, UA: NEGATIVE
Blood, UA: NEGATIVE
Glucose, UA: >=1000 mg/dL — AB
Ketones, POC UA: NEGATIVE mg/dL
Leukocytes, UA: NEGATIVE
Nitrite, UA: NEGATIVE
POC PROTEIN,UA: NEGATIVE
Spec Grav, UA: 1.01 (ref 1.010–1.025)
Urobilinogen, UA: 0.2 E.U./dL
pH, UA: 6 (ref 5.0–8.0)

## 2023-07-18 MED ORDER — LANCETS MISC. MISC
1.0000 | Freq: Two times a day (BID) | 1 refills | Status: AC
Start: 2023-07-18 — End: 2023-08-17

## 2023-07-18 MED ORDER — BLOOD GLUCOSE TEST VI STRP
1.0000 | ORAL_STRIP | Freq: Two times a day (BID) | 1 refills | Status: AC
Start: 2023-07-18 — End: 2023-10-26

## 2023-07-18 MED ORDER — BLOOD GLUCOSE MONITORING SUPPL DEVI: 1.0000 | Freq: Two times a day (BID) | 0 refills | Status: DC

## 2023-07-18 MED ORDER — LANCET DEVICE MISC
1.0000 | Freq: Two times a day (BID) | 1 refills | Status: AC
Start: 2023-07-18 — End: 2023-08-17

## 2023-07-18 NOTE — Progress Notes (Signed)
Subjective:    Patient ID: [name], female    DOB: 1966-10-17, 57 y.o.   MRN: [id number]  HPI ER follow up right sided abd pain and cystitis- completed antibiotics Pain was ongoing last week , but has subsided this week No fever.  States she has felt hot with chills at times at home.  Right CVA/flank tenderness at times.  No dysuria.  Some urgency and frequency.  Has not been able to check her sugar, needs new machine and supplies.  Has a complex cardiac history, regular follow-up with cardiology.  Has a Facilities manager.  SH: Cholecystectomy.  Review of Systems  Constitutional:  Positive for chills and fatigue. Negative for fever.  Respiratory:  Negative for cough, chest tightness and shortness of breath.   Cardiovascular:  Negative for chest pain.  Gastrointestinal:  Negative for constipation and diarrhea.  Genitourinary:  Positive for flank pain, frequency and urgency. Negative for dysuria, pelvic pain and vaginal discharge.       Objective:   Physical Exam NAD.  Alert, oriented.  Mild pallor noted.  Lungs clear.  Heart regular rate and rhythm.  Abdomen obese soft nondistended with moderate epigastric area tenderness noted.  No rebound or guarding.  No obvious masses.  Mild tenderness in the right upper quadrant outer aspect near the anterior axillary line.  Mild right CVA flank tenderness.  Skin clear. Chest CT, CT for renal stones and chest x-ray done 06/26/2023 and 06/27/2023 showed no acute changes related to the patient's symptoms. Today's Vitals   07/18/23 0845  BP: 118/70  Pulse: 85  Temp: (!) 97.2 F (36.2 C)  SpO2: 96%  Weight: 237 lb (107.5 kg)  Height: 5\' 4"  (1.626 m)   Body mass index is 40.68 kg/m. 06/26/2023: Potassium 3.4.        Assessment & Plan:   Problem List Items Addressed This Visit       Endocrine   Diabetes mellitus without complication (HCC) - Primary   Relevant Medications   Blood Glucose Monitoring Suppl DEVI   Glucose Blood  (BLOOD GLUCOSE TEST STRIPS) STRP   Lancet Device MISC   Lancets Misc. MISC   Other Relevant Orders   Comprehensive metabolic panel (Completed)   Hemoglobin A1c (Completed)   Other Visit Diagnoses     Fatigue, unspecified type       Relevant Orders   Comprehensive metabolic panel (Completed)   TSH (Completed)   Urinary frequency       Relevant Orders   POCT URINALYSIS DIP (CLINITEK) (Completed)   Urine Culture   Right upper quadrant abdominal pain       Relevant Orders   Lipase (Completed)   POCT URINALYSIS DIP (CLINITEK) (Completed)   Urine Culture   Hypokalemia       Relevant Orders   Comprehensive metabolic panel (Completed)   High risk medication use       Relevant Orders   Comprehensive metabolic panel (Completed)   Lipase (Completed)      Meds ordered this encounter  Medications   Blood Glucose Monitoring Suppl DEVI    Sig: 1 each by Does not apply route 2 (two) times daily. May substitute to any manufacturer covered by patient's insurance.    Dispense:  1 each    Refill:  0   Glucose Blood (BLOOD GLUCOSE TEST STRIPS) STRP    Sig: Inject 1 each into the skin 2 (two) times daily. May substitute to any manufacturer covered by patient's insurance  Dispense:  100 strip    Refill:  1   Lancet Device MISC    Sig: 1 each by Does not apply route in the morning and at bedtime. May substitute to any manufacturer covered by patient's insurance.    Dispense:  1 each    Refill:  1   Lancets Misc. MISC    Sig: Inject 1 each into the skin in the morning and at bedtime. May substitute to any manufacturer covered by patient's insurance.    Dispense:  100 each    Refill:  1   Refill sent for patient's glucose testing machine and supplies.  Recommend she check her sugar at least twice per day. Lab work pending regarding her abdominal pain. Reviewed labs and scans done from recent ED visit with patient from 06/26/2023. Urine culture pending. Warning signs reviewed.  Call back  if symptoms worsen or persist.

## 2023-07-19 ENCOUNTER — Encounter: Payer: Self-pay | Admitting: Nurse Practitioner

## 2023-07-19 LAB — COMPREHENSIVE METABOLIC PANEL
ALT: 15 IU/L (ref 0–32)
AST: 15 IU/L (ref 0–40)
Albumin: 4.1 g/dL (ref 3.8–4.9)
Alkaline Phosphatase: 83 IU/L (ref 44–121)
BUN/Creatinine Ratio: 11 (ref 9–23)
BUN: 9 mg/dL (ref 6–24)
Bilirubin Total: 0.3 mg/dL (ref 0.0–1.2)
CO2: 24 mmol/L (ref 20–29)
Calcium: 9.1 mg/dL (ref 8.7–10.2)
Chloride: 101 mmol/L (ref 96–106)
Creatinine, Ser: 0.85 mg/dL (ref 0.57–1.00)
Globulin, Total: 2.5 g/dL (ref 1.5–4.5)
Glucose: 118 mg/dL — ABNORMAL HIGH (ref 70–99)
Potassium: 3.8 mmol/L (ref 3.5–5.2)
Sodium: 142 mmol/L (ref 134–144)
Total Protein: 6.6 g/dL (ref 6.0–8.5)
eGFR: 80 mL/min/{1.73_m2} (ref >59)

## 2023-07-19 LAB — LIPASE: Lipase: 29 U/L (ref 14–72)

## 2023-07-19 LAB — HEMOGLOBIN A1C
Est. average glucose Bld gHb Est-mCnc: 126 mg/dL
Hgb A1c MFr Bld: 6 % — ABNORMAL HIGH (ref 4.8–5.6)

## 2023-07-19 LAB — TSH: TSH: 3.54 u[IU]/mL (ref 0.450–4.500)

## 2023-07-20 ENCOUNTER — Ambulatory Visit
Admission: EM | Admit: 2023-07-20 | Discharge: 2023-07-20 | Disposition: A | Discharge status: Home / Self Care | Payer: Medicaid Other | Attending: Nurse Practitioner | Admitting: Nurse Practitioner

## 2023-07-20 ENCOUNTER — Other Ambulatory Visit: Payer: Self-pay | Admitting: Nurse Practitioner

## 2023-07-20 DIAGNOSIS — Z1152 Encounter for screening for COVID-19: Secondary | ICD-10-CM

## 2023-07-20 DIAGNOSIS — Z20822 Contact with and (suspected) exposure to covid-19: Secondary | ICD-10-CM | POA: Diagnosis not present

## 2023-07-20 DIAGNOSIS — J069 Acute upper respiratory infection, unspecified: Secondary | ICD-10-CM | POA: Diagnosis not present

## 2023-07-20 MED ORDER — CEFDINIR 300 MG PO CAPS: 300.0000 mg | ORAL_CAPSULE | Freq: Two times a day (BID) | ORAL | 0 refills | Status: DC

## 2023-07-20 MED ORDER — PROMETHAZINE-DM 6.25-15 MG/5ML PO SYRP: 5.0000 mL | ORAL_SOLUTION | Freq: Four times a day (QID) | ORAL | 0 refills | Status: DC | PRN

## 2023-07-20 NOTE — ED Triage Notes (Signed)
Pt reports she has a runny nose,cough, headache, and sore throat x 2 days. Partner tested positive for covid

## 2023-07-20 NOTE — ED Provider Notes (Signed)
[facility]    CSN: [id number] Arrival date & time: 07/20/23  1103      History   Chief Complaint No chief complaint on file.   HPI [name] is a 57 y.o. female.   The history is provided by the patient.   Patient presents with a 2-day history of nasal congestion, runny nose, headache, cough, and sore throat.  Patient denies fever, chills, ear pain, ear drainage, wheezing, shortness of breath, difficulty breathing, chest pain, abdominal pain, nausea, vomiting, diarrhea.  Patient reports that her partner tested positive for COVID.  She states that she has been taking over-the-counter cough and cold medications for her symptoms.  Past Medical History:  Diagnosis Date   Anxiety    Arthritis    Blood transfusion without reported diagnosis 1999   due to heavy menses   Breast cancer (HCC) 05/22/2011   03/01/11, Stage 2, s/p lumpectomy, chemo/xrt   CHF (congestive heart failure) (HCC)    Complication of anesthesia    pt woke up during procedure   DDD (degenerative disc disease), lumbar    Depression 06/22/2011   Diverticulitis    DM (diabetes mellitus) (HCC) 06/22/2011   Genital warts    GERD (gastroesophageal reflux disease) 06/22/2011   Hx of adenomatous colonic polyps 10/2007   4mm sigmoid tubular adenoma, FH colon cancer, mother in mid-50s   Hypercholesterolemia 06/22/2011   Hypertension 06/22/2011   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   Iron deficiency anemia 03/25/2015   LBBB (left bundle branch block)    Mild CAD    Morbid obesity (HCC)    NICM (nonischemic cardiomyopathy) (HCC)    Personal history of chemotherapy    Personal history of radiation therapy    PSVT (paroxysmal supraventricular tachycardia)    Shortness of breath    STD (sexually transmitted disease)    HPV, 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   Syncope    Syncope    Vaginal bleeding 03/08/2014    Patient Active Problem List   Diagnosis Date Noted   Lumbar  radiculopathy 09/07/2022   NICM (nonischemic cardiomyopathy) (HCC) 08/29/2022   CAD (coronary artery disease) 08/29/2022   Aortic atherosclerosis (HCC) 08/29/2022   Lipoma 05/16/2022   Syncope 05/02/2022   Chronic pain syndrome 05/01/2022   LBBB (left bundle branch block) 05/01/2022   Diabetes mellitus without complication (HCC) 12/22/2020   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   Fatty liver 09/06/2016   H/O adenomatous polyp of colon 01/24/2012   FH: colon cancer 01/24/2012   GERD (gastroesophageal reflux disease) 06/22/2011   Hyperlipidemia 06/22/2011   Essential hypertension 06/22/2011   Obesity 06/22/2011   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    Past Surgical History:  Procedure Laterality Date   back surg x2     BALLOON DILATION N/A 09/22/2021   Procedure: BALLOON DILATION;  Surgeon: Lanelle Bal, DO;  Location: [facility];  Service: Endoscopy;  Laterality: N/A;   BIOPSY  09/22/2021   Procedure: BIOPSY;  Surgeon: Lanelle Bal, DO;  Location: [facility];  Service: Endoscopy;;   BIV ICD INSERTION CRT-D N/A 04/23/2023   Procedure: BIV ICD INSERTION CRT-D;  Surgeon: Maurice Small, MD;  Location: [facility];  Service: Cardiovascular;  Laterality: N/A;   BREAST LUMPECTOMY Right 2012   CHOLECYSTECTOMY     COLONOSCOPY  11/03/07   4-mm sessile polyp removed/small internal hemorrhoids/tubular adenoma, random colon bx  negative for microscopic colitis   COLONOSCOPY WITH PROPOFOL N/A 09/17/2016   Grade 2 hemorrhoids, colonic diverticulosis, ascending colon polyp (sessile serrated adenoma), 5 year surveillance   COLONOSCOPY WITH PROPOFOL N/A 09/22/2021   Procedure: COLONOSCOPY WITH PROPOFOL;  Surgeon: Lanelle Bal, DO;  Location: [facility];  Service: Endoscopy;  Laterality: N/A;  7:30am   COLONOSCOPY, ESOPHAGOGASTRODUODENOSCOPY (EGD) AND ESOPHAGEAL DILATION  01/2012   mild gastritis  s/p biopsy. Empiric dilation. Normal duodenum.    DILATATION & CURETTAGE/HYSTEROSCOPY WITH MYOSURE N/A 02/10/2015   Procedure: DILATATION & CURETTAGE/HYSTEROSCOPY WITH MYOSURE;  Surgeon: Patton Salles, MD;  Location: [facility];  Service: Gynecology;  Laterality: N/A;   ESOPHAGOGASTRODUODENOSCOPY (EGD) WITH PROPOFOL N/A 01/11/2020   Normal esophagus, s/p dilation, normal stomach, normal duodenum.    ESOPHAGOGASTRODUODENOSCOPY (EGD) WITH PROPOFOL N/A 09/22/2021   Procedure: ESOPHAGOGASTRODUODENOSCOPY (EGD) WITH PROPOFOL;  Surgeon: Lanelle Bal, DO;  Location: [facility];  Service: Endoscopy;  Laterality: N/A;   MALONEY DILATION N/A 01/11/2020   Procedure: Elease Hashimoto DILATION;  Surgeon: Corbin Ade, MD;  Location: [facility];  Service: Endoscopy;  Laterality: N/A;   MM BREAST STEREO BX*L*R/S     rt.   POLYPECTOMY  09/17/2016   Procedure: POLYPECTOMY;  Surgeon: Corbin Ade, MD;  Location: [facility];  Service: Endoscopy;;  ascending colon   POLYPECTOMY  09/22/2021   Procedure: POLYPECTOMY;  Surgeon: Lanelle Bal, DO;  Location: [facility];  Service: Endoscopy;;   PORT-A-CATH REMOVAL  05/26/2012   Procedure: REMOVAL PORT-A-CATH;  Surgeon: [name], MD;  Location: [facility];  Service: General;  Laterality: N/A;  Minor Room   PORTACATH PLACEMENT      OB History     Gravida  4   Para  3   Term  0   Preterm  0   AB  1   Living  3      SAB  0   IAB  0   Ectopic  0   Multiple      Live Births  3            Home Medications    Prior to Admission medications   Medication Sig Start Date End Date Taking? Authorizing Provider  promethazine-dextromethorphan (PROMETHAZINE-DM) 6.25-15 MG/5ML syrup Take 5 mLs by mouth 4 (four) times daily as needed. 07/20/23  Yes Jerric Oyen-Warren, Sadie Haber, NP  apixaban (ELIQUIS) 5 MG TABS tablet Take 1 tablet (5 mg total) by mouth 2 (two) times daily. 05/02/23   Duke Salvia, MD  atorvastatin (LIPITOR) 40 MG tablet  TAKE 1 TABLET BY MOUTH EVERYDAY AT BEDTIME 04/11/23   Cook, [place] G, DO  blood glucose meter kit and supplies Dispense based on patient and insurance preference. Use up to four times daily as directed. (FOR ICD-10 E10.9, E11.9). 01/05/20   Merlyn Albert, MD  Blood Glucose Monitoring Suppl DEVI 1 each by Does not apply route 2 (two) times daily. May substitute to any manufacturer covered by patient's insurance. 07/18/23   Campbell Riches, NP  Blood Pressure KIT 1 Units by Does not apply route daily. 02/28/23   Mallipeddi, Vishnu P, MD  cefdinir (OMNICEF) 300 MG capsule Take 1 capsule (300 mg total) by mouth 2 (two) times daily. 07/20/23   Campbell Riches, NP  cetirizine (ZYRTEC) 10 MG tablet TAKE 1 TABLET BY MOUTH EVERY DAY 03/25/23   Everlene Other G, DO  citalopram (CELEXA) 40 MG tablet TAKE 1 TABLET  BY MOUTH EVERY DAY 05/08/23   Everlene Other G, DO  Dulaglutide (TRULICITY) 0.75 MG/0.5ML SOPN INJECT 0.75 MG SUBCUTANEOUSLY ONCE A WEEK 04/29/23   Everlene Other G, DO  DULoxetine (CYMBALTA) 30 MG capsule Take 1 capsule (30 mg total) by mouth daily. 05/30/23   Lovorn, Aundra Millet, MD  empagliflozin (JARDIANCE) 10 MG TABS tablet Take 1 tablet (10 mg total) by mouth daily before breakfast. 01/02/23   Jake Bathe, MD  ENTRESTO 24-26 MG TAKE 1 TABLET BY MOUTH TWICE A DAY 06/10/23   Jake Bathe, MD  fluticasone (FLONASE) 50 MCG/ACT nasal spray USE 2 SPRAY(S) IN EACH NOSTRIL ONCE DAILY AS NEEDED 06/25/23   Everlene Other G, DO  Glucose Blood (BLOOD GLUCOSE TEST STRIPS) STRP Inject 1 each into the skin 2 (two) times daily. May substitute to any manufacturer covered by patient's insurance 07/18/23 10/26/23  Campbell Riches, NP  HYDROcodone-acetaminophen (NORCO/VICODIN) 5-325 MG tablet Take 1 tablet by mouth every 6 (six) hours as needed. 07/09/23   Jones Bales, NP  Lancet Device MISC 1 each by Does not apply route in the morning and at bedtime. May substitute to any manufacturer covered by patient's insurance. 07/18/23  08/17/23  Campbell Riches, NP  Lancets Misc. MISC Inject 1 each into the skin in the morning and at bedtime. May substitute to any manufacturer covered by patient's insurance. 07/18/23 08/17/23  Campbell Riches, NP  metFORMIN (GLUCOPHAGE-XR) 750 MG 24 hr tablet TAKE 1 TABLET BY MOUTH EVERY DAY WITH BREAKFAST 04/11/23   Everlene Other G, DO  metoprolol succinate (TOPROL XL) 50 MG 24 hr tablet Take 1 tablet (50 mg total) by mouth daily. 12/04/22   Jake Bathe, MD  nystatin (MYCOSTATIN/NYSTOP) powder APPLY 1 APPLICATION TOPICALLY 3 (THREE) TIMES DAILY. APPLY TO AFFECTED AREA FOR UP TO 7 DAYS 04/03/23   Patton Salles, MD  ondansetron (ZOFRAN-ODT) 8 MG disintegrating tablet DISSOLVE 1 TABLET IN MOUTH EVERY 8 HOURS AS NEEDED FOR NAUSEA OR VOMITING. 06/17/23   Everlene Other G, DO  pantoprazole (PROTONIX) 40 MG tablet TAKE 1 TABLET (40 MG TOTAL) BY MOUTH TWICE A DAY BEFORE MEALS 07/18/23   Gelene Mink, NP  Vitamin D, Ergocalciferol, (DRISDOL) 1.25 MG (50000 UNIT) CAPS capsule TAKE 1 CAPSULE BY MOUTH ONE TIME PER WEEK 04/30/23   Doreatha Massed, MD    Family History Family History  Problem Relation Age of Onset   Colon cancer Mother        mid-50s, died of metastatic disease   Cancer Mother        liver   Coronary artery disease Mother    Diabetes type I Mother    Peripheral vascular disease Mother        Carotid disease in her 65s   Coronary artery disease Father        CAD in his 95s   Diabetes Father    Diabetes type I Father    Hypertension Father    Bipolar disorder Sister    Coronary artery disease Brother        MI in his 14s   Hypertension Brother    Hypertension Maternal Grandmother    Hyperlipidemia Maternal Grandmother    Stroke Maternal Grandmother    Hypertension Maternal Grandfather    Diabetes Maternal Grandfather    Diabetes Paternal Grandmother    Heart disease Paternal Grandfather     Social History Social History   Tobacco Use   Smoking status: Never  Smokeless tobacco: Never  Vaping Use   Vaping status: Never Used  Substance Use Topics   Alcohol use: No    Alcohol/week: 0.0 standard drinks of alcohol   Drug use: No     Allergies   Flagyl [metronidazole], Lansoprazole, and Pravastatin   Review of Systems Review of Systems Per HPI  Physical Exam Triage Vital Signs ED Triage Vitals  Encounter Vitals Group     BP 07/20/23 1304 122/84     Systolic BP Percentile --      Diastolic BP Percentile --      Pulse Rate 07/20/23 1304 93     Resp 07/20/23 1304 18     Temp 07/20/23 1304 98.6 F (37 C)     Temp Source 07/20/23 1304 Oral     SpO2 07/20/23 1304 96 %     Weight --      Height --      Head Circumference --      Peak Flow --      Pain Score 07/20/23 1300 6     Pain Loc --      Pain Education --      Exclude from Growth Chart --    No data found.  Updated Vital Signs BP 122/84 (BP Location: Left Arm)   Pulse 93   Temp 98.6 F (37 C) (Oral)   Resp 18   LMP 04/05/2016 Comment: spotting 11/2016 and 12/2016  SpO2 96%   Visual Acuity Right Eye Distance:   Left Eye Distance:   Bilateral Distance:    Right Eye Near:   Left Eye Near:    Bilateral Near:     Physical Exam Vitals and nursing note reviewed.  Constitutional:      General: She is not in acute distress.    Appearance: Normal appearance.  HENT:     Head: Normocephalic.     Right Ear: Tympanic membrane, ear canal and external ear normal.     Left Ear: Tympanic membrane, ear canal and external ear normal.     Nose: Congestion and rhinorrhea present.     Mouth/Throat:     Mouth: Mucous membranes are moist.     Comments: Moderate cobblestoning present to posterior oropharynx Eyes:     Extraocular Movements: Extraocular movements intact.     Conjunctiva/sclera: Conjunctivae normal.     Pupils: Pupils are equal, round, and reactive to light.  Cardiovascular:     Rate and Rhythm: Normal rate and regular rhythm.     Pulses: Normal pulses.      Heart sounds: Normal heart sounds.  Pulmonary:     Effort: Pulmonary effort is normal. No respiratory distress.     Breath sounds: Normal breath sounds. No stridor. No wheezing, rhonchi or rales.  Abdominal:     General: Bowel sounds are normal.     Palpations: Abdomen is soft.     Tenderness: There is no abdominal tenderness.  Musculoskeletal:     Cervical back: Normal range of motion.  Skin:    General: Skin is warm and dry.  Neurological:     General: No focal deficit present.     Mental Status: She is alert and oriented to person, place, and time.  Psychiatric:        Mood and Affect: Mood normal.        Behavior: Behavior normal.      UC Treatments / Results  Labs (all labs ordered are listed, but only abnormal results are displayed) Labs  Reviewed  SARS CORONAVIRUS 2 (TAT 6-24 HRS)    EKG   Radiology No results found.  Procedures Procedures (including critical care time)  Medications Ordered in UC Medications - No data to display  Initial Impression / Assessment and Plan / UC Course  I have reviewed the triage vital signs and the nursing notes.  Pertinent labs & imaging results that were available during my care of the patient were reviewed by me and considered in my medical decision making (see chart for details).  The patient is well-appearing, she is in no acute distress, vital signs are stable.  COVID test is pending.  Suspect patient has COVID given her most recent close exposure.  She is a candidate to receive Paxlovid (she will need to hold atorvastatin for 2 weeks if she begins taking the medication).  Symptomatic treatment provided for her cough with Promethazine DM.  Patient advised to continue Flonase that she currently has.  Supportive care recommendations were provided and discussed with the patient to include increasing fluids, allowing for plenty of rest, warm salt water gargles, over-the-counter analgesics for pain or discomfort, and use of a  humidifier in her bedroom during sleep.  Patient was given strict ER follow-up precautions.  Patient is in agreement with this plan of care and verbalizes understanding.  All questions were answered.  Patient stable for discharge.   Final Clinical Impressions(s) / UC Diagnoses   Final diagnoses:  Viral upper respiratory tract infection with cough  Close exposure to COVID-19 virus  Encounter for screening for COVID-19     Discharge Instructions      COVID test is pending.  As discussed, the result will be available via MyChart.  If you see the result, please call this office to discuss antiviral treatment. Take medication as prescribed. Increase fluids and allow for plenty of rest. May take over-the-counter Tylenol as needed for pain, fever, or general discomfort. Recommend normal saline nasal spray throughout the day to help with nasal congestion and runny nose. Warm salt water gargles 3-4 times daily as needed for throat pain or discomfort. Recommend using a humidifier in your bedroom at nighttime during sleep or sleeping elevated on pillows while cough symptoms persist. If your COVID test is positive, you will need to remain home if you develop fever.  You can return to your normal activities when she has been fever free for 24 hours with no medication. Go to the emergency department if you experience shortness of breath, difficulty breathing, or other concerns. Follow-up as needed.     ED Prescriptions     Medication Sig Dispense Auth. Provider   promethazine-dextromethorphan (PROMETHAZINE-DM) 6.25-15 MG/5ML syrup Take 5 mLs by mouth 4 (four) times daily as needed. 118 mL Patricia Perales-Warren, Sadie Haber, NP      PDMP not reviewed this encounter.   Abran Cantor, NP 07/20/23 1352

## 2023-07-20 NOTE — Discharge Instructions (Signed)
COVID test is pending.  As discussed, the result will be available via MyChart.  If you see the result, please call this office to discuss antiviral treatment. Take medication as prescribed. Increase fluids and allow for plenty of rest. May take over-the-counter Tylenol as needed for pain, fever, or general discomfort. Recommend normal saline nasal spray throughout the day to help with nasal congestion and runny nose. Warm salt water gargles 3-4 times daily as needed for throat pain or discomfort. Recommend using a humidifier in your bedroom at nighttime during sleep or sleeping elevated on pillows while cough symptoms persist. If your COVID test is positive, you will need to remain home if you develop fever.  You can return to your normal activities when she has been fever free for 24 hours with no medication. Go to the emergency department if you experience shortness of breath, difficulty breathing, or other concerns. Follow-up as needed.

## 2023-07-21 DIAGNOSIS — Z419 Encounter for procedure for purposes other than remedying health state, unspecified: Secondary | ICD-10-CM | POA: Diagnosis not present

## 2023-07-22 LAB — SARS CORONAVIRUS 2 (TAT 6-24 HRS): SARS Coronavirus 2: POSITIVE — AB

## 2023-07-23 ENCOUNTER — Telehealth: Payer: Self-pay

## 2023-07-23 NOTE — Telephone Encounter (Signed)
Pt called wanting to discuss lab results. Pt is positive for COVID providers note states that she is a good candidate for PAXLOVID.   Pt states she is feeling fine has not had any symptoms and no fever for 24 hours pt called work stating this and that she was told as long as she didn't have any fever for 24 hours she could return to work and continue to wear a mask. Pt's work told her she would need a  not stating that.

## 2023-07-24 ENCOUNTER — Ambulatory Visit (INDEPENDENT_AMBULATORY_CARE_PROVIDER_SITE_OTHER): Payer: Medicaid Other

## 2023-07-24 ENCOUNTER — Telehealth: Payer: Self-pay

## 2023-07-24 DIAGNOSIS — I428 Other cardiomyopathies: Secondary | ICD-10-CM | POA: Diagnosis not present

## 2023-07-24 LAB — CUP PACEART REMOTE DEVICE CHECK
Battery Remaining Longevity: 132 mo
Battery Voltage: 3.08 V
Brady Statistic AP VP Percent: 0.03 %
Brady Statistic AP VS Percent: 0.01 %
Brady Statistic AS VP Percent: 98.58 %
Brady Statistic AS VS Percent: 1.39 %
Brady Statistic RA Percent Paced: 0.04 %
Brady Statistic RV Percent Paced: 4.63 %
Date Time Interrogation Session: 20240903232924
HighPow Impedance: 67 Ohm
Implantable Lead Connection Status: 753985
Implantable Lead Connection Status: 753985
Implantable Lead Connection Status: 753985
Implantable Lead Implant Date: 20240604
Implantable Lead Implant Date: 20240604
Implantable Lead Implant Date: 20240604
Implantable Lead Location: 753858
Implantable Lead Location: 753859
Implantable Lead Location: 753860
Implantable Lead Model: 4598
Implantable Lead Model: 5076
Implantable Pulse Generator Implant Date: 20240604
Lead Channel Impedance Value: 1083 Ohm
Lead Channel Impedance Value: 1197 Ohm
Lead Channel Impedance Value: 1273 Ohm
Lead Channel Impedance Value: 399 Ohm
Lead Channel Impedance Value: 399 Ohm
Lead Channel Impedance Value: 551 Ohm
Lead Channel Impedance Value: 608 Ohm
Lead Channel Impedance Value: 646 Ohm
Lead Channel Impedance Value: 703 Ohm
Lead Channel Impedance Value: 760 Ohm
Lead Channel Impedance Value: 779 Ohm
Lead Channel Impedance Value: 874 Ohm
Lead Channel Impedance Value: 931 Ohm
Lead Channel Pacing Threshold Amplitude: 0.625 V
Lead Channel Pacing Threshold Amplitude: 0.625 V
Lead Channel Pacing Threshold Amplitude: 0.75 V
Lead Channel Pacing Threshold Pulse Width: 0.4 ms
Lead Channel Pacing Threshold Pulse Width: 0.4 ms
Lead Channel Pacing Threshold Pulse Width: 0.4 ms
Lead Channel Sensing Intrinsic Amplitude: 11.1 mV
Lead Channel Sensing Intrinsic Amplitude: 2.6 mV
Lead Channel Setting Pacing Amplitude: 1.25 V
Lead Channel Setting Pacing Amplitude: 1.5 V
Lead Channel Setting Pacing Amplitude: 2 V
Lead Channel Setting Pacing Pulse Width: 0.4 ms
Lead Channel Setting Pacing Pulse Width: 0.4 ms
Lead Channel Setting Sensing Sensitivity: 0.3 mV
Zone Setting Status: 755011
Zone Setting Status: 755011
Zone Setting Status: 755011

## 2023-07-24 LAB — URINE CULTURE

## 2023-07-24 NOTE — Telephone Encounter (Signed)
Results discussed with pt per Eber Jones, pt verbalized understanding

## 2023-07-25 ENCOUNTER — Inpatient Hospital Stay: Payer: Medicaid Other | Attending: Physician Assistant | Admitting: Oncology

## 2023-07-25 DIAGNOSIS — G629 Polyneuropathy, unspecified: Secondary | ICD-10-CM | POA: Insufficient documentation

## 2023-07-25 DIAGNOSIS — Z923 Personal history of irradiation: Secondary | ICD-10-CM | POA: Insufficient documentation

## 2023-07-25 DIAGNOSIS — C50911 Malignant neoplasm of unspecified site of right female breast: Secondary | ICD-10-CM | POA: Diagnosis not present

## 2023-07-25 DIAGNOSIS — E559 Vitamin D deficiency, unspecified: Secondary | ICD-10-CM | POA: Insufficient documentation

## 2023-07-25 DIAGNOSIS — Z853 Personal history of malignant neoplasm of breast: Secondary | ICD-10-CM | POA: Insufficient documentation

## 2023-07-25 NOTE — Progress Notes (Signed)
[facility] [address], [place] [address]   Patient Care Team: Tommie Sams, DO as PCP - General (Family Medicine) Mealor, Roberts Gaudy, MD as PCP - Electrophysiology (Cardiology) Mallipeddi, Orion Modest, MD as PCP - Cardiology (Cardiology) West Bali, MD (Inactive) (Gastroenterology) Lanelle Bal, DO as Consulting Physician (Gastroenterology)  SUMMARY OF ONCOLOGIC HISTORY: Oncology History  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HCC)  02/28/2011 Initial Diagnosis   Right needle core biopsy demonstrating Invasive ductal carcinoma of right breast   03/21/2011 Surgery   Right lumpectomy demonstrating a 3.8 cm invasive ductal carcinoma, grade III, no LVI, and 0/1 lymph nodes   05/11/2011 - 07/13/2011 Chemotherapy   AC x 4   08/03/2011 - 10/19/2011 Chemotherapy   Paclitaxel weekly x 12   11/07/2011 - 12/31/2011 Radiation Therapy     02/10/2015 Procedure   Hysteroscopy with dilation and curettage by Dr. Conley Simmonds.   02/10/2015 Pathology Results   Diagnosis Endometrium, curettage - ABUNDANT BLOOD AND DEGENERATIVE SECRETORY ENDOMETRIUM WITH STROMAL BREAKDOWN (VERY LIMITED MATERIAL). - INFLAMED SQUAMOUS EPITHELIUM AND ENDOCERVICAL MUCOSA, NO DYSPLASIA OR MALIGNANCY.     CHIEF COMPLIANT: Follow-up for right breast cancer  I connected with [name] on 07/30/23 at  2:00 PM EDT by telephone visit and verified that I am speaking with the correct person using two identifiers.   I discussed the limitations, risks, security and privacy concerns of performing an evaluation and management service by telemedicine and the availability of in-person appointments. I also discussed with the patient that there may be a patient responsible charge related to this service. The patient expressed understanding and agreed to proceed.   Other persons participating in the visit and their role in the encounter: NP, Patient    Patient's location: Home  Provider's  location: Clinic    INTERVAL HISTORY: [name] is a 57 y.o. female here today for follow up of her right breast cancer.  She was last evaluated in clinic over a year ago.  Reports overall doing well.  Appetite is 60% energy levels are 50%.  Denies any pain.    Diagnosed with COVID on 07/20/2023.   Today she reports feeling improved.  Has a persistent cough, headache and dizziness secondary to recent COVID infection.  Has occasional constipation but no diarrhea.  Has a pacemaker and will experience chest pain intermittently.  Denies any new lumps or bumps.  Reports continued tenderness to right lumpectomy site.  Taking calcium and vitamin D supplements.  Has occasional left arm swelling.  REVIEW OF SYSTEMS:   Review of Systems  Constitutional:  Positive for fatigue.  HENT:   Positive for trouble swallowing.   Respiratory:  Positive for cough and shortness of breath.   Cardiovascular:  Positive for chest pain.  Gastrointestinal:  Positive for constipation and nausea.  Neurological:  Positive for dizziness and headaches.  Hematological:  Negative for adenopathy.    I have reviewed the past medical history, past surgical history, social history and family history with the patient and they are unchanged from previous note.   ALLERGIES:   is allergic to flagyl [metronidazole], lansoprazole, and pravastatin.   MEDICATIONS:  Current Outpatient Medications  Medication Sig Dispense Refill   apixaban (ELIQUIS) 5 MG TABS tablet Take 1 tablet (5 mg total) by mouth 2 (two) times daily. 60 tablet 0   atorvastatin (LIPITOR) 40 MG tablet TAKE 1 TABLET BY MOUTH EVERYDAY AT BEDTIME 90 tablet 1  blood glucose meter kit and supplies Dispense based on patient and insurance preference. Use up to four times daily as directed. (FOR ICD-10 E10.9, E11.9). 1 each 5   Blood Glucose Monitoring Suppl DEVI 1 each by Does not apply route 2 (two) times daily. May substitute to any manufacturer covered by  patient's insurance. 1 each 0   Blood Pressure KIT 1 Units by Does not apply route daily. 1 kit 0   cefdinir (OMNICEF) 300 MG capsule Take 1 capsule (300 mg total) by mouth 2 (two) times daily. 10 capsule 0   cetirizine (ZYRTEC) 10 MG tablet TAKE 1 TABLET BY MOUTH EVERY DAY 30 tablet 5   citalopram (CELEXA) 40 MG tablet TAKE 1 TABLET BY MOUTH EVERY DAY 90 tablet 2   Dulaglutide (TRULICITY) 0.75 MG/0.5ML SOPN INJECT 0.75 MG SUBCUTANEOUSLY ONCE A WEEK 6 mL 5   DULoxetine (CYMBALTA) 30 MG capsule Take 1 capsule (30 mg total) by mouth daily. 90 capsule 1   empagliflozin (JARDIANCE) 10 MG TABS tablet Take 1 tablet (10 mg total) by mouth daily before breakfast. 90 tablet 3   ENTRESTO 24-26 MG TAKE 1 TABLET BY MOUTH TWICE A DAY 60 tablet 11   fluticasone (FLONASE) 50 MCG/ACT nasal spray USE 2 SPRAY(S) IN EACH NOSTRIL ONCE DAILY AS NEEDED 48 mL 1   Glucose Blood (BLOOD GLUCOSE TEST STRIPS) STRP Inject 1 each into the skin 2 (two) times daily. May substitute to any manufacturer covered by patient's insurance 100 strip 1   HYDROcodone-acetaminophen (NORCO/VICODIN) 5-325 MG tablet Take 1 tablet by mouth every 6 (six) hours as needed. 120 tablet 0   Lancet Device MISC 1 each by Does not apply route in the morning and at bedtime. May substitute to any manufacturer covered by patient's insurance. 1 each 1   Lancets Misc. MISC Inject 1 each into the skin in the morning and at bedtime. May substitute to any manufacturer covered by patient's insurance. 100 each 1   metFORMIN (GLUCOPHAGE-XR) 750 MG 24 hr tablet TAKE 1 TABLET BY MOUTH EVERY DAY WITH BREAKFAST 90 tablet 1   metoprolol succinate (TOPROL XL) 50 MG 24 hr tablet Take 1 tablet (50 mg total) by mouth daily. 30 tablet 11   nystatin (MYCOSTATIN/NYSTOP) powder APPLY 1 APPLICATION TOPICALLY 3 (THREE) TIMES DAILY. APPLY TO AFFECTED AREA FOR UP TO 7 DAYS 30 g 2   ondansetron (ZOFRAN-ODT) 8 MG disintegrating tablet DISSOLVE 1 TABLET IN MOUTH EVERY 8 HOURS AS NEEDED  FOR NAUSEA OR VOMITING. 20 tablet 1   pantoprazole (PROTONIX) 40 MG tablet TAKE 1 TABLET (40 MG TOTAL) BY MOUTH TWICE A DAY BEFORE MEALS 180 tablet 1   promethazine-dextromethorphan (PROMETHAZINE-DM) 6.25-15 MG/5ML syrup Take 5 mLs by mouth 4 (four) times daily as needed. 118 mL 0   Vitamin D, Ergocalciferol, (DRISDOL) 1.25 MG (50000 UNIT) CAPS capsule TAKE 1 CAPSULE BY MOUTH ONE TIME PER WEEK 12 capsule 3   No current facility-administered medications for this visit.     PHYSICAL EXAMINATION: Performance status (ECOG): 1 - Symptomatic but completely ambulatory  There were no vitals filed for this visit.  Wt Readings from Last 3 Encounters:  07/18/23 237 lb (107.5 kg)  06/26/23 237 lb 1.6 oz (107.5 kg)  06/20/23 234 lb 12.8 oz (106.5 kg)   Physical Exam Neurological:     Mental Status: She is alert and oriented to person, place, and time.      LABORATORY DATA:  I have reviewed the data as listed  Latest Ref Rng & Units 07/18/2023   10:06 AM 06/26/2023    9:42 PM 06/13/2023    2:59 PM  CMP  Glucose 70 - 99 mg/dL 960  93  454   BUN 6 - 24 mg/dL 9  9  11    Creatinine 0.57 - 1.00 mg/dL 0.98  1.19  1.47   Sodium 134 - 144 mmol/L 142  136  138   Potassium 3.5 - 5.2 mmol/L 3.8  3.4  4.1   Chloride 96 - 106 mmol/L 101  99  103   CO2 20 - 29 mmol/L 24  24  28    Calcium 8.7 - 10.2 mg/dL 9.1  8.7  8.8   Total Protein 6.0 - 8.5 g/dL 6.6   7.6   Total Bilirubin 0.0 - 1.2 mg/dL 0.3   0.8   Alkaline Phos 44 - 121 IU/L 83   65   AST 0 - 40 IU/L 15   17   ALT 0 - 32 IU/L 15   18    No results found for: "CAN153" Lab Results  Component Value Date   WBC 7.3 06/26/2023   HGB 14.0 06/26/2023   HCT 45.0 06/26/2023   MCV 88.6 06/26/2023   PLT 194 06/26/2023   NEUTROABS 4.0 06/13/2023    ASSESSMENT:  1.  Stage II (T2N0) right breast IDC: -Diagnosed in April 2012, ER negative, PR 2% positive, Ki-67 90%, HER-2 negative. -Status post lumpectomy followed by adjuvant chemotherapy with  AC x4 followed by 12 weekly paclitaxel completed on 10/19/2011. -Adjuvant breast radiation completed on 12/31/2011. -Tamoxifen was likely not recommended because of low positivity for PR. -Mammogram on 04/25/2020 was BI-RADS 2 category.   PLAN:  1.  Stage II (T2N0) right breast IDC: - Mammogram from 06/13/2023 was read as BI-RADS Category 1 negative.  Recommend follow-up in 12 months. - Labs from 06/13/2023 show a normal CBC.   2.  Neuropathy: - Numbness in the extremities is stable.  No indication for gabapentin.   3.  Vitamin D deficiency: - Vitamin D is 73.45.  -Currently on ergocalciferol 50000 units weekly.  Recommend taking every other week.  Will recheck vitamin D level at next clinic visit.  PLAN SUMMARY: >> Take ergocalciferol every other week and we will recheck labs in 1 year. >> Schedule mammogram for July 2025. >> Return to clinic in 1 year for labs (CBC, vitamin D, CMP) and an clinic visit.       Orders placed this encounter:  Orders Placed This Encounter  Procedures   MM 3D SCREENING MAMMOGRAM BILATERAL BREAST   I provided 20 minutes of non face-to-face telephone visit time during this encounter, and > 50% was spent counseling as documented under my assessment & plan.    Durenda Hurt, NP 07/30/2023 5:09 AM

## 2023-08-02 ENCOUNTER — Encounter: Payer: Medicaid Other | Admitting: Cardiovascular Disease

## 2023-08-04 ENCOUNTER — Other Ambulatory Visit: Payer: Self-pay | Admitting: Family Medicine

## 2023-08-06 ENCOUNTER — Ambulatory Visit: Payer: Medicaid Other | Admitting: Cardiovascular Disease

## 2023-08-06 NOTE — Progress Notes (Signed)
Remote ICD transmission.   

## 2023-08-07 ENCOUNTER — Telehealth: Payer: Self-pay | Admitting: *Deleted

## 2023-08-07 NOTE — Telephone Encounter (Signed)
[name] called and has had problems with [facility] and would like her hydrocodone sent to [facility] in [place].

## 2023-08-08 MED ORDER — HYDROCODONE-ACETAMINOPHEN 5-325 MG PO TABS: 1.0000 | ORAL_TABLET | Freq: Four times a day (QID) | ORAL | 0 refills | Status: DC | PRN

## 2023-08-08 NOTE — Telephone Encounter (Signed)
Dr Berline Chough sent prescription to pharmacy.

## 2023-08-08 NOTE — Telephone Encounter (Signed)
Pt.notified

## 2023-08-08 NOTE — Telephone Encounter (Signed)
Pt calling again this morning re: Hydrocodone refill @ [facility].

## 2023-08-13 MED ORDER — PANTOPRAZOLE SODIUM 40 MG PO TBEC: 40.0000 mg | DELAYED_RELEASE_TABLET | Freq: Two times a day (BID) | ORAL | 1 refills | Status: DC

## 2023-08-13 NOTE — Telephone Encounter (Signed)
Prescription sent in.  Mandy: needs office visit.

## 2023-08-20 DIAGNOSIS — Z419 Encounter for procedure for purposes other than remedying health state, unspecified: Secondary | ICD-10-CM | POA: Diagnosis not present

## 2023-08-24 ENCOUNTER — Other Ambulatory Visit: Payer: Self-pay | Admitting: Family Medicine

## 2023-09-03 ENCOUNTER — Ambulatory Visit: Payer: Medicaid Other | Admitting: Gastroenterology

## 2023-09-06 ENCOUNTER — Encounter: Payer: Medicaid Other | Attending: Registered Nurse | Admitting: Physical Medicine and Rehabilitation

## 2023-09-06 ENCOUNTER — Encounter: Payer: Self-pay | Admitting: Physical Medicine and Rehabilitation

## 2023-09-06 VITALS — BP 137/81 | HR 79 | Ht 64.0 in | Wt 234.8 lb

## 2023-09-06 DIAGNOSIS — Z79891 Long term (current) use of opiate analgesic: Secondary | ICD-10-CM | POA: Insufficient documentation

## 2023-09-06 DIAGNOSIS — M67952 Unspecified disorder of synovium and tendon, left thigh: Secondary | ICD-10-CM | POA: Diagnosis not present

## 2023-09-06 DIAGNOSIS — Z5181 Encounter for therapeutic drug level monitoring: Secondary | ICD-10-CM | POA: Diagnosis not present

## 2023-09-06 DIAGNOSIS — G894 Chronic pain syndrome: Secondary | ICD-10-CM | POA: Diagnosis not present

## 2023-09-06 MED ORDER — HYDROCODONE-ACETAMINOPHEN 5-325 MG PO TABS: 1.0000 | ORAL_TABLET | Freq: Four times a day (QID) | ORAL | 0 refills | Status: DC | PRN

## 2023-09-06 MED ORDER — DULOXETINE HCL 30 MG PO CPEP: 30.0000 mg | ORAL_CAPSULE | Freq: Every day | ORAL | 1 refills | Status: DC

## 2023-09-06 NOTE — Progress Notes (Signed)
Subjective:    Patient ID: [name], female    DOB: 23-May-1966, 57 y.o.   MRN: [id number]  HPI  Pt is a 57 yr old female with hx of DM,- A1c 6.1;  Anxiety, R breast invasive ductal cancer of R breast in 2012 neuropathy in hands and feet; and arthritis and pain in legs.  Also hx of lumbar surgery- been on Norco for "years".  Grief reaction and depression over death of sister.     Here for f/u on chronic back pain     Things  L hip "giving her a fit"    Hx of trigger finger injections- and Knee injections-   Hx of DM- A1c 6.0-    Had pacemaker and defibrillator-  June 4th-    Pain- little worse in back-  Massive cleaning last week- couldn't move the next day.   Was able to work on Monday.   PCA worker-   Walking- walks in the house- but doesn't record how many steps  But not walking in neighborhood.  Because people have been killed walking in her neighborhood, walking.   Weight 2 lbs more last visit.     Pain Inventory Average Pain 6 Pain Right Now 8 My pain is sharp, stabbing, and aching  In the last 24 hours, has pain interfered with the following? General activity 3 Relation with others 3 Enjoyment of life 3 What TIME of day is your pain at its worst? morning , daytime, evening, and night Sleep (in general) Poor  Pain is worse with: walking, sitting, standing, and some activites Pain improves with: medication and injections Relief from Meds: 7  Family History  Problem Relation Age of Onset   Colon cancer Mother        mid-50s, died of metastatic disease   Cancer Mother        liver   Coronary artery disease Mother    Diabetes type I Mother    Peripheral vascular disease Mother        Carotid disease in her 21s   Coronary artery disease Father        CAD in his 32s   Diabetes Father    Diabetes type I Father    Hypertension Father    Bipolar disorder Sister    Coronary artery disease Brother        MI in his 3s   Hypertension Brother     Hypertension Maternal Grandmother    Hyperlipidemia Maternal Grandmother    Stroke Maternal Grandmother    Hypertension Maternal Grandfather    Diabetes Maternal Grandfather    Diabetes Paternal Grandmother    Heart disease Paternal Grandfather    Social History   Socioeconomic History   Marital status: Married    Spouse name: Not on file   Number of children: 3   Years of education: Not on file   Highest education level: Some college, no degree  Occupational History   Occupation: child care    Employer: [employer]  Tobacco Use   Smoking status: Never   Smokeless tobacco: Never  Vaping Use   Vaping status: Never Used  Substance and Sexual Activity   Alcohol use: No    Alcohol/week: 0.0 standard drinks of alcohol   Drug use: No   Sexual activity: Yes    Partners: Male    Birth control/protection: Post-menopausal  Other Topics Concern   Not on file  Social History Narrative   ** Merged History Encounter **  Social Determinants of Health   Financial Resource Strain: Medium Risk (03/11/2023)   Overall Financial Resource Strain (CARDIA)    Difficulty of Paying Living Expenses: Somewhat hard  Food Insecurity: Food Insecurity Present (03/11/2023)   Hunger Vital Sign    Worried About Running Out of Food in the Last Year: Sometimes true    Ran Out of Food in the Last Year: Sometimes true  Transportation Needs: No Transportation Needs (03/11/2023)   PRAPARE - Administrator, Civil Service (Medical): No    Lack of Transportation (Non-Medical): No  Physical Activity: Unknown (03/11/2023)   Exercise Vital Sign    Days of Exercise per Week: Patient declined    Minutes of Exercise per Session: Not on file  Stress: Stress Concern Present (03/11/2023)   [employer] of Occupational Health - Occupational Stress Questionnaire    Feeling of Stress : To some extent  Social Connections: Moderately Isolated (03/11/2023)   Social Connection and Isolation  Panel [NHANES]    Frequency of Communication with Friends and Family: More than three times a week    Frequency of Social Gatherings with Friends and Family: Twice a week    Attends Religious Services: Never    Database administrator or Organizations: No    Attends Engineer, structural: Not on file    Marital Status: Married   Past Surgical History:  Procedure Laterality Date   back surg x2     BALLOON DILATION N/A 09/22/2021   Procedure: BALLOON DILATION;  Surgeon: Lanelle Bal, DO;  [facility];  Service: Endoscopy;  Laterality: N/A;   BIOPSY  09/22/2021   Procedure: BIOPSY;  Surgeon: Lanelle Bal, DO;  [facility];  Service: Endoscopy;;   BIV ICD INSERTION CRT-D N/A 04/23/2023   Procedure: BIV ICD INSERTION CRT-D;  Surgeon: Maurice Small, MD;  Location: [facility] INVASIVE CV LAB;  Service: Cardiovascular;  Laterality: N/A;   BREAST LUMPECTOMY Right 2012   CHOLECYSTECTOMY     COLONOSCOPY  11/03/07   4-mm sessile polyp removed/small internal hemorrhoids/tubular adenoma, random colon bx negative for microscopic colitis   COLONOSCOPY WITH PROPOFOL N/A 09/17/2016   Grade 2 hemorrhoids, colonic diverticulosis, ascending colon polyp (sessile serrated adenoma), 5 year surveillance   COLONOSCOPY WITH PROPOFOL N/A 09/22/2021   Procedure: COLONOSCOPY WITH PROPOFOL;  Surgeon: Lanelle Bal, DO;  [facility];  Service: Endoscopy;  Laterality: N/A;  7:30am   COLONOSCOPY, ESOPHAGOGASTRODUODENOSCOPY (EGD) AND ESOPHAGEAL DILATION  01/2012   mild gastritis s/p biopsy. Empiric dilation. Normal duodenum.    DILATATION & CURETTAGE/HYSTEROSCOPY WITH MYOSURE N/A 02/10/2015   Procedure: DILATATION & CURETTAGE/HYSTEROSCOPY WITH MYOSURE;  Surgeon: Patton Salles, MD;  Location: [facility];  Service: Gynecology;  Laterality: N/A;   ESOPHAGOGASTRODUODENOSCOPY (EGD) WITH PROPOFOL N/A 01/11/2020   Normal esophagus, s/p dilation, normal stomach, normal  duodenum.    ESOPHAGOGASTRODUODENOSCOPY (EGD) WITH PROPOFOL N/A 09/22/2021   Procedure: ESOPHAGOGASTRODUODENOSCOPY (EGD) WITH PROPOFOL;  Surgeon: Lanelle Bal, DO;  [facility];  Service: Endoscopy;  Laterality: N/A;   MALONEY DILATION N/A 01/11/2020   Procedure: Elease Hashimoto DILATION;  Surgeon: Corbin Ade, MD;  [facility];  Service: Endoscopy;  Laterality: N/A;   MM BREAST STEREO BX*L*R/S     rt.   POLYPECTOMY  09/17/2016   Procedure: POLYPECTOMY;  Surgeon: Corbin Ade, MD;  [facility];  Service: Endoscopy;;  ascending colon   POLYPECTOMY  09/22/2021  Procedure: POLYPECTOMY;  Surgeon: Lanelle Bal, DO;  [facility];  Service: Endoscopy;;   PORT-A-CATH REMOVAL  05/26/2012   Procedure: REMOVAL PORT-A-CATH;  Surgeon: [name], MD;  Location: [facility];  Service: General;  Laterality: N/A;  Minor Room   PORTACATH PLACEMENT     Past Surgical History:  Procedure Laterality Date   back surg x2     BALLOON DILATION N/A 09/22/2021   Procedure: BALLOON DILATION;  Surgeon: Lanelle Bal, DO;  [facility];  Service: Endoscopy;  Laterality: N/A;   BIOPSY  09/22/2021   Procedure: BIOPSY;  Surgeon: Lanelle Bal, DO;  [facility];  Service: Endoscopy;;   BIV ICD INSERTION CRT-D N/A 04/23/2023   Procedure: BIV ICD INSERTION CRT-D;  Surgeon: Maurice Small, MD;  Location: [facility] INVASIVE CV LAB;  Service: Cardiovascular;  Laterality: N/A;   BREAST LUMPECTOMY Right 2012   CHOLECYSTECTOMY     COLONOSCOPY  11/03/07   4-mm sessile polyp removed/small internal hemorrhoids/tubular adenoma, random colon bx negative for microscopic colitis   COLONOSCOPY WITH PROPOFOL N/A 09/17/2016   Grade 2 hemorrhoids, colonic diverticulosis, ascending colon polyp (sessile serrated adenoma), 5 year surveillance   COLONOSCOPY WITH PROPOFOL N/A 09/22/2021   Procedure: COLONOSCOPY WITH PROPOFOL;  Surgeon: Lanelle Bal, DO;  [facility];  Service: Endoscopy;  Laterality: N/A;  7:30am   COLONOSCOPY, ESOPHAGOGASTRODUODENOSCOPY (EGD) AND ESOPHAGEAL DILATION  01/2012   mild gastritis s/p biopsy. Empiric dilation. Normal duodenum.    DILATATION & CURETTAGE/HYSTEROSCOPY WITH MYOSURE N/A 02/10/2015   Procedure: DILATATION & CURETTAGE/HYSTEROSCOPY WITH MYOSURE;  Surgeon: Patton Salles, MD;  Location: [facility];  Service: Gynecology;  Laterality: N/A;   ESOPHAGOGASTRODUODENOSCOPY (EGD) WITH PROPOFOL N/A 01/11/2020   Normal esophagus, s/p dilation, normal stomach, normal duodenum.    ESOPHAGOGASTRODUODENOSCOPY (EGD) WITH PROPOFOL N/A 09/22/2021   Procedure: ESOPHAGOGASTRODUODENOSCOPY (EGD) WITH PROPOFOL;  Surgeon: Lanelle Bal, DO;  [facility];  Service: Endoscopy;  Laterality: N/A;   MALONEY DILATION N/A 01/11/2020   Procedure: Elease Hashimoto DILATION;  Surgeon: Corbin Ade, MD;  [facility];  Service: Endoscopy;  Laterality: N/A;   MM BREAST STEREO BX*L*R/S     rt.   POLYPECTOMY  09/17/2016   Procedure: POLYPECTOMY;  Surgeon: Corbin Ade, MD;  [facility];  Service: Endoscopy;;  ascending colon   POLYPECTOMY  09/22/2021   Procedure: POLYPECTOMY;  Surgeon: Lanelle Bal, DO;  [facility];  Service: Endoscopy;;   PORT-A-CATH REMOVAL  05/26/2012   Procedure: REMOVAL PORT-A-CATH;  Surgeon: [name], MD;  Location: [facility];  Service: General;  Laterality: N/A;  Minor Room   PORTACATH PLACEMENT     Past Medical History:  Diagnosis Date   Anxiety    Arthritis    Blood transfusion without reported diagnosis 1999   due to heavy menses   Breast cancer (HCC) 05/22/2011   03/01/11, Stage 2, s/p lumpectomy, chemo/xrt   CHF (congestive heart failure) (HCC)    Complication of anesthesia    pt woke up during procedure   DDD (degenerative disc disease), lumbar    Depression 06/22/2011   Diverticulitis    DM (diabetes mellitus) (HCC) 06/22/2011   Genital warts     GERD (gastroesophageal reflux disease) 06/22/2011   Hx of adenomatous colonic polyps 10/2007   4mm sigmoid tubular adenoma, FH colon cancer, mother in mid-50s   Hypercholesterolemia 06/22/2011   Hypertension 06/22/2011  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   Iron deficiency anemia 03/25/2015   LBBB (left bundle branch block)    Mild CAD    Morbid obesity (HCC)    NICM (nonischemic cardiomyopathy) (HCC)    Personal history of chemotherapy    Personal history of radiation therapy    PSVT (paroxysmal supraventricular tachycardia) (HCC)    Shortness of breath    STD (sexually transmitted disease)    HPV, 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   Syncope    Syncope    Vaginal bleeding 03/08/2014   BP 137/81   Pulse 79   Ht 5\' 4"  (1.626 m)   Wt 234 lb 12.8 oz (106.5 kg)   LMP 04/05/2016 Comment: spotting 11/2016 and 12/2016  SpO2 96%   BMI 40.30 kg/m   Opioid Risk Score:   Fall Risk Score:  `1  Depression screen PHQ 2/9     09/06/2023    1:01 PM 07/18/2023    9:01 AM 03/15/2023   12:47 PM 03/14/2023    3:16 PM 12/17/2022   12:54 PM 10/20/2021    3:10 PM 07/14/2021    3:19 PM  Depression screen PHQ 2/9  Decreased Interest 1 1 1 1  0 3 1  Down, Depressed, Hopeless 1 1 1 1  0 3 1  PHQ - 2 Score 2 2 2 2  0 6 2  Altered sleeping  1  2     Tired, decreased energy  2  2     Change in appetite  0  0     Feeling bad or failure about yourself   1  0     Trouble concentrating  1  2     Moving slowly or fidgety/restless  0  0     Suicidal thoughts  0  0     PHQ-9 Score  7  8     Difficult doing work/chores    Somewhat difficult       Review of Systems  Constitutional: Negative.   HENT: Negative.    Eyes: Negative.   Respiratory: Negative.    Cardiovascular: Negative.   Gastrointestinal: Negative.   Endocrine: Negative.   Genitourinary: Negative.   Musculoskeletal:  Positive for back pain and neck pain.       Bilateral shoulders and knees  Skin: Negative.    Allergic/Immunologic: Negative.   Neurological: Negative.   Hematological:  Bruises/bleeds easily.       Apixaban  Psychiatric/Behavioral:  Positive for dysphoric mood.   All other systems reviewed and are negative.      Objective:   Physical Exam  Awake, alert, appropriate, BMI 40.30- accompanied by husband, NAD Very TTP on L gluteal medius-       Assessment & Plan:   Pt is a 57 yr old female with hx of DM,- A1c 6.1;  Anxiety, R breast invasive ductal cancer of R breast in 2012 neuropathy in hands and feet; and arthritis and pain in legs.  Also hx of lumbar surgery- been on Norco for "years".  Grief reaction and depression over death of sister.     Here for f/u on chronic back pain    Referral to Sports medicine- Dr [name] at [facility] for that L hip injection  2. Walking- decent pair of shoes- start with 10-15 minutes/day-  - another option- pedal bike - start out with a little resistance- and start 10-15 minutes til not sore, then bump up again.  3.  Con't Norco- 5/325 mg  4x/day- # 120- will send in 2  months supply   4. Will do Urine drug screen- UDS per opiate policy at this clinic.    5. Will have pt see Riley Lam in 2 months- and me every 4 months    6. On Duloxetine- 30 mg daily. For nerve/back pain- send in  6 months supply- to [facility]-    7 F/U with Riley Lam in 2months and me in 4 months- chronic pain   I spent a total of  23  minutes on total care today- >50% coordination of care- due to discussion and dx of L gluteal medius tendonopathy  and f/u as well as UDS, and refills of meds.

## 2023-09-06 NOTE — Patient Instructions (Signed)
Pt is a 57 yr old female with hx of DM,- A1c 6.1;  Anxiety, R breast invasive ductal cancer of R breast in 2012 neuropathy in hands and feet; and arthritis and pain in legs.  Also hx of lumbar surgery- been on Norco for "years".  Grief reaction and depression over death of sister.     Here for f/u on chronic back pain    Referral to Sports medicine- Dr [name] at [facility] for that L hip injection  2. Walking- decent pair of shoes- start with 10-15 minutes/day-  - another option- pedal bike - start out with a little resistance- and start 10-15 minutes til not sore, then bump up again.    3.  Con't Norco- 5/325 mg  4x/day- # 120- will send in 2  months supply   4. Will do Urine drug screen- UDS per opiate policy at this clinic.    5. Will have pt see Riley Lam in 2 months- and me every 4 months    6. On Duloxetine- 30 mg daily. For nerve/back pain- send in  6months supply- to [facility]-    7 F/U with Riley Lam in 2months and me in 4 months- chronic pain

## 2023-09-12 LAB — TOXASSURE SELECT,+ANTIDEPR,UR

## 2023-09-18 NOTE — Progress Notes (Deleted)
[name] [facility] [address] [place] [address] Phone: [phone]   Assessment and Plan:     There are no diagnoses linked to this encounter.  ***   Pertinent previous records reviewed include ***   Follow Up: ***     Subjective:   I, [name], am serving as a Neurosurgeon for Doctor Richardean Sale  Chief Complaint: left glute pain   HPI:   09/19/2023 Patient is a 57year old female complaining of glute pain. Patient states   Relevant Historical Information: ***  Additional pertinent review of systems negative.   Current Outpatient Medications:    apixaban (ELIQUIS) 5 MG TABS tablet, Take 1 tablet (5 mg total) by mouth 2 (two) times daily., Disp: 60 tablet, Rfl: 0   atorvastatin (LIPITOR) 40 MG tablet, TAKE 1 TABLET BY MOUTH EVERYDAY AT BEDTIME, Disp: 90 tablet, Rfl: 1   blood glucose meter kit and supplies, Dispense based on patient and insurance preference. Use up to four times daily as directed. (FOR ICD-10 E10.9, E11.9)., Disp: 1 each, Rfl: 5   Blood Glucose Monitoring Suppl DEVI, 1 each by Does not apply route 2 (two) times daily. May substitute to any manufacturer covered by patient's insurance., Disp: 1 each, Rfl: 0   Blood Pressure KIT, 1 Units by Does not apply route daily., Disp: 1 kit, Rfl: 0   cefdinir (OMNICEF) 300 MG capsule, Take 1 capsule (300 mg total) by mouth 2 (two) times daily., Disp: 10 capsule, Rfl: 0   cetirizine (ZYRTEC) 10 MG tablet, TAKE 1 TABLET BY MOUTH EVERY DAY, Disp: 30 tablet, Rfl: 5   citalopram (CELEXA) 40 MG tablet, TAKE 1 TABLET BY MOUTH EVERY DAY, Disp: 90 tablet, Rfl: 2   Dulaglutide (TRULICITY) 0.75 MG/0.5ML SOPN, INJECT 0.75 MG SUBCUTANEOUSLY ONCE A WEEK, Disp: 6 mL, Rfl: 5   DULoxetine (CYMBALTA) 30 MG capsule, Take 1 capsule (30 mg total) by mouth daily., Disp: 90 capsule, Rfl: 1   empagliflozin (JARDIANCE) 10 MG TABS tablet, Take 1 tablet (10 mg total) by mouth daily before  breakfast., Disp: 90 tablet, Rfl: 3   ENTRESTO 24-26 MG, TAKE 1 TABLET BY MOUTH TWICE A DAY, Disp: 60 tablet, Rfl: 11   fluticasone (FLONASE) 50 MCG/ACT nasal spray, USE 2 SPRAY(S) IN EACH NOSTRIL ONCE DAILY AS NEEDED, Disp: 48 mL, Rfl: 1   Glucose Blood (BLOOD GLUCOSE TEST STRIPS) STRP, Inject 1 each into the skin 2 (two) times daily. May substitute to any manufacturer covered by patient's insurance, Disp: 100 strip, Rfl: 1   HYDROcodone-acetaminophen (NORCO) 5-325 MG tablet, Take 1 tablet by mouth every 6 (six) hours as needed for moderate pain (pain score 4-6). Next fill 10/06/23 for chronic pain, Disp: 120 tablet, Rfl: 0   HYDROcodone-acetaminophen (NORCO/VICODIN) 5-325 MG tablet, Take 1 tablet by mouth every 6 (six) hours as needed., Disp: 120 tablet, Rfl: 0   metFORMIN (GLUCOPHAGE-XR) 750 MG 24 hr tablet, TAKE 1 TABLET BY MOUTH EVERY DAY WITH BREAKFAST, Disp: 90 tablet, Rfl: 1   metoprolol succinate (TOPROL XL) 50 MG 24 hr tablet, Take 1 tablet (50 mg total) by mouth daily., Disp: 30 tablet, Rfl: 11   nystatin (MYCOSTATIN/NYSTOP) powder, APPLY 1 APPLICATION TOPICALLY 3 (THREE) TIMES DAILY. APPLY TO AFFECTED AREA FOR UP TO 7 DAYS, Disp: 30 g, Rfl: 2   ondansetron (ZOFRAN-ODT) 8 MG disintegrating tablet, DISSOLVE 1 TABLET IN MOUTH EVERY 8 HOURS AS NEEDED FOR NAUSEA OR VOMITING, Disp: 20 tablet, Rfl: 0  pantoprazole (PROTONIX) 40 MG tablet, Take 1 tablet (40 mg total) by mouth 2 (two) times daily before a meal., Disp: 60 tablet, Rfl: 1   promethazine-dextromethorphan (PROMETHAZINE-DM) 6.25-15 MG/5ML syrup, Take 5 mLs by mouth 4 (four) times daily as needed., Disp: 118 mL, Rfl: 0   Vitamin D, Ergocalciferol, (DRISDOL) 1.25 MG (50000 UNIT) CAPS capsule, TAKE 1 CAPSULE BY MOUTH ONE TIME PER WEEK, Disp: 12 capsule, Rfl: 3   Objective:     There were no vitals filed for this visit.    There is no height or weight on file to calculate BMI.    Physical Exam:    ***   Electronically signed by:   [name] [facility] 2:10 PM 09/18/23

## 2023-09-19 ENCOUNTER — Ambulatory Visit: Payer: Medicaid Other | Admitting: Student

## 2023-09-19 ENCOUNTER — Ambulatory Visit: Payer: Medicaid Other | Admitting: Sports Medicine

## 2023-09-20 DIAGNOSIS — Z419 Encounter for procedure for purposes other than remedying health state, unspecified: Secondary | ICD-10-CM | POA: Diagnosis not present

## 2023-09-30 ENCOUNTER — Other Ambulatory Visit: Payer: Self-pay | Admitting: Family Medicine

## 2023-09-30 DIAGNOSIS — J302 Other seasonal allergic rhinitis: Secondary | ICD-10-CM

## 2023-10-03 ENCOUNTER — Ambulatory Visit: Payer: Medicaid Other | Admitting: Gastroenterology

## 2023-10-03 ENCOUNTER — Encounter: Payer: Self-pay | Admitting: Gastroenterology

## 2023-10-03 ENCOUNTER — Telehealth: Payer: Self-pay | Admitting: *Deleted

## 2023-10-03 VITALS — BP 132/81 | HR 78 | Temp 98.0°F | Ht 64.0 in | Wt 237.4 lb

## 2023-10-03 DIAGNOSIS — Z9049 Acquired absence of other specified parts of digestive tract: Secondary | ICD-10-CM | POA: Diagnosis not present

## 2023-10-03 DIAGNOSIS — K219 Gastro-esophageal reflux disease without esophagitis: Secondary | ICD-10-CM | POA: Diagnosis not present

## 2023-10-03 DIAGNOSIS — R1033 Periumbilical pain: Secondary | ICD-10-CM | POA: Diagnosis not present

## 2023-10-03 DIAGNOSIS — Z800 Family history of malignant neoplasm of digestive organs: Secondary | ICD-10-CM | POA: Diagnosis not present

## 2023-10-03 DIAGNOSIS — Z860101 Personal history of adenomatous and serrated colon polyps: Secondary | ICD-10-CM | POA: Diagnosis not present

## 2023-10-03 MED ORDER — DEXLANSOPRAZOLE 60 MG PO CPDR: 60.0000 mg | DELAYED_RELEASE_CAPSULE | Freq: Every day | ORAL | 3 refills | Status: DC

## 2023-10-03 NOTE — Progress Notes (Signed)
Gastroenterology Office Note     Primary Care Physician:  Tommie Sams, DO  Primary Gastroenterologist: Dr. Marletta Lor    Chief Complaint   Chief Complaint  Patient presents with   Gastroesophageal Reflux    Here for refills on pantoprazole     History of Present Illness   [name] is a 57 y.o. female presenting today with a history of  abdominal pain, gastritis, history of adenomas, and FH colon cancer in mother in her 78s, who succumbed to the disease.    Will have periumbilical abdominal pain with some foods like on fire. Spicy foods and red sauce triggers this. No issues with salad or soup. Any other foods will also cause issues. Gallbladder absent. Pantoprazole BID. Doesn't feel this is controlling GERD symptoms. Will burp sometimes. Has tried omeprazole and Nexium. Feels bloated after eating. Normal GES in 2022. A1c is 6.0. No significant weight loss. Symptoms have been going on for 3-4 months. Some food fear as worried about pain. Tried and failed omeprazole, pantoprazole, nexium.   Colonoscopy Nov 2022: non-bleeding internal hemorrhoids, four 5-8 mm polyps, Tubular adenomas, surveillance due in 2027.  EGD Nov 2022: gastritis, normal duodenum, multiple gastric polyps. Negative H.pylori.     Past Medical History:  Diagnosis Date   Anxiety    Arthritis    Blood transfusion without reported diagnosis 1999   due to heavy menses   Breast cancer (HCC) 05/22/2011   03/01/11, Stage 2, s/p lumpectomy, chemo/xrt   CHF (congestive heart failure) (HCC)    Complication of anesthesia    pt woke up during procedure   DDD (degenerative disc disease), lumbar    Depression 06/22/2011   Diverticulitis    DM (diabetes mellitus) (HCC) 06/22/2011   Genital warts    GERD (gastroesophageal reflux disease) 06/22/2011   Hx of adenomatous colonic polyps 10/2007   4mm sigmoid tubular adenoma, FH colon cancer, mother in mid-50s   Hypercholesterolemia 06/22/2011   Hypertension  06/22/2011   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   Iron deficiency anemia 03/25/2015   LBBB (left bundle branch block)    Mild CAD    Morbid obesity (HCC)    NICM (nonischemic cardiomyopathy) (HCC)    Personal history of chemotherapy    Personal history of radiation therapy    PSVT (paroxysmal supraventricular tachycardia) (HCC)    Shortness of breath    STD (sexually transmitted disease)    HPV, 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   Syncope    Syncope    Vaginal bleeding 03/08/2014    Past Surgical History:  Procedure Laterality Date   back surg x2     BALLOON DILATION N/A 09/22/2021   Procedure: BALLOON DILATION;  Surgeon: Lanelle Bal, DO;  Location: [facility];  Service: Endoscopy;  Laterality: N/A;   BIOPSY  09/22/2021   Procedure: BIOPSY;  Surgeon: Lanelle Bal, DO;  [facility]: [facility];  Service: Endoscopy;;   BIV ICD INSERTION CRT-D N/A 04/23/2023   Procedure: BIV ICD INSERTION CRT-D;  Surgeon: Maurice Small, MD;  Location: [facility];  Service: Cardiovascular;  Laterality: N/A;   BREAST LUMPECTOMY Right 2012   CHOLECYSTECTOMY     COLONOSCOPY  11/03/07   4-mm sessile polyp removed/small internal hemorrhoids/tubular adenoma, random colon bx negative for microscopic colitis   COLONOSCOPY WITH PROPOFOL N/A 09/17/2016   Grade 2 hemorrhoids, colonic diverticulosis, ascending colon polyp (sessile serrated adenoma), 5 year surveillance   COLONOSCOPY  WITH PROPOFOL N/A 09/22/2021   Procedure: COLONOSCOPY WITH PROPOFOL;  Surgeon: Lanelle Bal, DO;  [facility]: [facility];  Service: Endoscopy;  Laterality: N/A;  7:30am   COLONOSCOPY, ESOPHAGOGASTRODUODENOSCOPY (EGD) AND ESOPHAGEAL DILATION  01/2012   mild gastritis s/p biopsy. Empiric dilation. Normal duodenum.    DILATATION & CURETTAGE/HYSTEROSCOPY WITH MYOSURE N/A 02/10/2015   Procedure: DILATATION & CURETTAGE/HYSTEROSCOPY WITH MYOSURE;  Surgeon: Patton Salles, MD;   Location: [facility];  Service: Gynecology;  Laterality: N/A;   ESOPHAGOGASTRODUODENOSCOPY (EGD) WITH PROPOFOL N/A 01/11/2020   Normal esophagus, s/p dilation, normal stomach, normal duodenum.    ESOPHAGOGASTRODUODENOSCOPY (EGD) WITH PROPOFOL N/A 09/22/2021   Procedure: ESOPHAGOGASTRODUODENOSCOPY (EGD) WITH PROPOFOL;  Surgeon: Lanelle Bal, DO;  [facility];  Service: Endoscopy;  Laterality: N/A;   MALONEY DILATION N/A 01/11/2020   Procedure: Elease Hashimoto DILATION;  Surgeon: Corbin Ade, MD;  [facility]: [facility];  Service: Endoscopy;  Laterality: N/A;   MM BREAST STEREO BX*L*R/S     rt.   POLYPECTOMY  09/17/2016   Procedure: POLYPECTOMY;  Surgeon: Corbin Ade, MD;  [facility]: [facility];  Service: Endoscopy;;  ascending colon   POLYPECTOMY  09/22/2021   Procedure: POLYPECTOMY;  Surgeon: Lanelle Bal, DO;  [facility]: [facility];  Service: Endoscopy;;   PORT-A-CATH REMOVAL  05/26/2012   Procedure: REMOVAL PORT-A-CATH;  Surgeon: [name], MD;  Location: [facility];  Service: General;  Laterality: N/A;  Minor Room   PORTACATH PLACEMENT      Current Outpatient Medications  Medication Sig Dispense Refill   apixaban (ELIQUIS) 5 MG TABS tablet Take 1 tablet (5 mg total) by mouth 2 (two) times daily. 60 tablet 0   atorvastatin (LIPITOR) 40 MG tablet TAKE 1 TABLET BY MOUTH EVERYDAY AT BEDTIME 90 tablet 1   blood glucose meter kit and supplies Dispense based on patient and insurance preference. Use up to four times daily as directed. (FOR ICD-10 E10.9, E11.9). 1 each 5   Blood Glucose Monitoring Suppl DEVI 1 each by Does not apply route 2 (two) times daily. May substitute to any manufacturer covered by patient's insurance. 1 each 0   Blood Pressure KIT 1 Units by Does not apply route daily. 1 kit 0   cetirizine (ZYRTEC) 10 MG tablet Take 1 tablet by mouth once daily 60 tablet 0   citalopram (CELEXA) 40 MG tablet TAKE 1 TABLET BY MOUTH EVERY DAY 90 tablet 2   Dulaglutide  (TRULICITY) 0.75 MG/0.5ML SOPN INJECT 0.75 MG SUBCUTANEOUSLY ONCE A WEEK 6 mL 5   DULoxetine (CYMBALTA) 30 MG capsule Take 1 capsule (30 mg total) by mouth daily. 90 capsule 1   empagliflozin (JARDIANCE) 10 MG TABS tablet Take 1 tablet (10 mg total) by mouth daily before breakfast. 90 tablet 3   ENTRESTO 24-26 MG TAKE 1 TABLET BY MOUTH TWICE A DAY 60 tablet 11   fluticasone (FLONASE) 50 MCG/ACT nasal spray USE 2 SPRAY(S) IN EACH NOSTRIL ONCE DAILY AS NEEDED 48 mL 1   Glucose Blood (BLOOD GLUCOSE TEST STRIPS) STRP Inject 1 each into the skin 2 (two) times daily. May substitute to any manufacturer covered by patient's insurance 100 strip 1   HYDROcodone-acetaminophen (NORCO) 5-325 MG tablet Take 1 tablet by mouth every 6 (six) hours as needed for moderate pain (pain score 4-6). Next fill 10/06/23 for chronic pain 120 tablet 0   metFORMIN (GLUCOPHAGE-XR) 750 MG 24 hr tablet TAKE 1 TABLET BY MOUTH EVERY DAY WITH  BREAKFAST 90 tablet 1   metoprolol succinate (TOPROL XL) 50 MG 24 hr tablet Take 1 tablet (50 mg total) by mouth daily. 30 tablet 11   pantoprazole (PROTONIX) 40 MG tablet Take 1 tablet (40 mg total) by mouth 2 (two) times daily before a meal. 60 tablet 1   Vitamin D, Ergocalciferol, (DRISDOL) 1.25 MG (50000 UNIT) CAPS capsule TAKE 1 CAPSULE BY MOUTH ONE TIME PER WEEK 12 capsule 3   No current facility-administered medications for this visit.    Allergies as of 10/03/2023 - Review Complete 10/03/2023  Allergen Reaction Noted   Flagyl [metronidazole] Hives, Itching, and Swelling 10/15/2017   Lansoprazole Other (See Comments) 12/26/2017   Pravastatin Other (See Comments) 08/02/2015    Family History  Problem Relation Age of Onset   Colon cancer Mother        mid-50s, died of metastatic disease   Cancer Mother        liver   Coronary artery disease Mother    Diabetes type I Mother    Peripheral vascular disease Mother        Carotid disease in her 49s   Coronary artery disease Father         CAD in his 12s   Diabetes Father    Diabetes type I Father    Hypertension Father    Bipolar disorder Sister    Coronary artery disease Brother        MI in his 57s   Hypertension Brother    Hypertension Maternal Grandmother    Hyperlipidemia Maternal Grandmother    Stroke Maternal Grandmother    Hypertension Maternal Grandfather    Diabetes Maternal Grandfather    Diabetes Paternal Grandmother    Heart disease Paternal Grandfather     Social History   Socioeconomic History   Marital status: Married    Spouse name: Not on file   Number of children: 3   Years of education: Not on file   Highest education level: Some college, no degree  Occupational History   Occupation: child care    Employer: [employer]  Tobacco Use   Smoking status: Never   Smokeless tobacco: Never  Vaping Use   Vaping status: Never Used  Substance and Sexual Activity   Alcohol use: No    Alcohol/week: 0.0 standard drinks of alcohol   Drug use: No   Sexual activity: Yes    Partners: Male    Birth control/protection: Post-menopausal  Other Topics Concern   Not on file  Social History Narrative   ** Merged History Encounter **       Social Determinants of Health   Financial Resource Strain: Medium Risk (03/11/2023)   Overall Financial Resource Strain (CARDIA)    Difficulty of Paying Living Expenses: Somewhat hard  Food Insecurity: Food Insecurity Present (03/11/2023)   Hunger Vital Sign    Worried About Running Out of Food in the Last Year: Sometimes true    Ran Out of Food in the Last Year: Sometimes true  Transportation Needs: No Transportation Needs (03/11/2023)   PRAPARE - Administrator, Civil Service (Medical): No    Lack of Transportation (Non-Medical): No  Physical Activity: Unknown (03/11/2023)   Exercise Vital Sign    Days of Exercise per Week: Patient declined    Minutes of Exercise per Session: Not on file  Stress: Stress Concern Present (03/11/2023)    [employer] of Occupational Health - Occupational Stress Questionnaire    Feeling of  Stress : To some extent  Social Connections: Moderately Isolated (03/11/2023)   Social Connection and Isolation Panel [NHANES]    Frequency of Communication with Friends and Family: More than three times a week    Frequency of Social Gatherings with Friends and Family: Twice a week    Attends Religious Services: Never    Database administrator or Organizations: No    Attends Engineer, structural: Not on file    Marital Status: Married  Catering manager Violence: Not on file     Review of Systems   Gen: Denies any fever, chills, fatigue, weight loss, lack of appetite.  CV: Denies chest pain, heart palpitations, peripheral edema, syncope.  Resp: Denies shortness of breath at rest or with exertion. Denies wheezing or cough.  GI: Denies dysphagia or odynophagia. Denies jaundice, hematemesis, fecal incontinence. GU : Denies urinary burning, urinary frequency, urinary hesitancy MS: Denies joint pain, muscle weakness, cramps, or limitation of movement.  Derm: Denies rash, itching, dry skin Psych: Denies depression, anxiety, memory loss, and confusion Heme: Denies bruising, bleeding, and enlarged lymph nodes.   Physical Exam   BP 132/81 (BP Location: Left Arm, Patient Position: Sitting, Cuff Size: Large)   Pulse 78   Temp 98 F (36.7 C) (Oral)   Ht 5\' 4"  (1.626 m)   Wt 237 lb 6.4 oz (107.7 kg)   LMP 04/05/2016 Comment: spotting 11/2016 and 12/2016  SpO2 97%   BMI 40.75 kg/m  General:   Alert and oriented. Pleasant and cooperative. Well-nourished and well-developed.  Head:  Normocephalic and atraumatic. Eyes:  Without icterus Abdomen:  +BS, soft, TTP epigastric and periumbilical, and non-distended. No HSM noted. No guarding or rebound. No masses appreciated.  Rectal:  Deferred  Msk:  Symmetrical without gross deformities. Normal posture. Extremities:  Without edema. Neurologic:  Alert  and  oriented x4;  grossly normal neurologically. Skin:  Intact without significant lesions or rashes. Psych:  Alert and cooperative. Normal mood and affect.   Assessment   [name] is a 57 y.o. female presenting today with a history of  abdominal pain, gastritis, history of adenomas, and FH colon cancer in mother in her 69s, who succumbed to the disease.   Noting periumbilical abdominal pain postprandially with majority of foods, some food fear and aversions, but no weight loss. She has known atherosclerotic disease. Gallbladder absent. Labs in late August 2024 normal, and she was having these symptoms at that time as well. Will check CTA. EGD and colonoscopy fairly up-to-date. Consider low dose TCA if CTA is normal.   GERD: not ideally controlled. Trial Dexilant. Previously failing omeprazole, esomeprazole, pantoprazole. Suspect underlying delayed gastric emptying contributing to this due to med effect +/- diabetes.  Colon polyps and FH colon cancer: surveillance 2027.      PLAN   Stop pantoprazole. Start Dexilant. CTA in near future 6-8 weeks return   Gelene Mink, PhD, [facility]

## 2023-10-03 NOTE — Telephone Encounter (Signed)
PA approved via RADMD Validity Period: 10/03/2023 - 12/02/2023 Authorization: [id number] (Abdomen and Pelvis CT Angiography)  CTA scheduled for 11/18, arrival 9:15am.   Called pt, LMOVM. Also sent mychart message.

## 2023-10-03 NOTE — Patient Instructions (Signed)
We are arranging a special CT scan to look at the blood flow to your intestines. Do not take metformin on that day or for 72 hours following this.  I have sent in Dexilant to your pharmacy. I would like for you to take this once daily instead of the pantoprazole.  Please message if this is not helpful!  We will see you in 6-8 weeks!  I enjoyed seeing you again today! I value our relationship and want to provide genuine, compassionate, and quality care. You may receive a survey regarding your visit with me, and I welcome your feedback! Thanks so much for taking the time to complete this. I look forward to seeing you again.      Gelene Mink, PhD, ANP-BC [facility]

## 2023-10-04 ENCOUNTER — Telehealth: Payer: Self-pay

## 2023-10-04 NOTE — Telephone Encounter (Signed)
PA done on Cover My Meds for Dexlansoprazole 60 mg DR. Tried/failed: Omeprazole, Pantoprazole, Nexium and Esomeprazole. Pt has tried and failed: Omeprazole, Pantoprazole, Nexium and Esomeprazole. Waiting on a response from Cover My Meds.

## 2023-10-04 NOTE — Telephone Encounter (Signed)
PA approved for Dexilant Cap DR 60MG  . Approval timeframe start 10/04/2023 and ends 11/19/2023. Pt sent a message in her MyChart regarding this. Gave to [place] for scan to chart

## 2023-10-05 ENCOUNTER — Ambulatory Visit (HOSPITAL_BASED_OUTPATIENT_CLINIC_OR_DEPARTMENT_OTHER): Admission: RE | Admit: 2023-10-05 | Payer: Medicaid Other | Source: Ambulatory Visit

## 2023-10-07 ENCOUNTER — Ambulatory Visit (HOSPITAL_COMMUNITY): Payer: Medicaid Other

## 2023-10-09 NOTE — Progress Notes (Unsigned)
  Electrophysiology Office Note:   ID:  [name], DOB July 13, 1966, MRN [id number]  Primary Cardiologist: Marjo Bicker, MD Electrophysiologist: Maurice Small, MD *** {Click to update primary MD,subspecialty MD or APP then REFRESH:1}    History of Present Illness:   [name] is a 57 y.o. female with h/o LBBB, NICM, and h/o breast CA seen today for routine electrophysiology followup.   Since last being seen in our clinic the patient reports doing ***.  she denies chest pain, palpitations, dyspnea, PND, orthopnea, nausea, vomiting, dizziness, syncope, edema, weight gain, or early satiety.   Review of systems complete and found to be negative unless listed in HPI.   EP Information / Studies Reviewed:    EKG is ordered today. Personal review as below.       ICD Interrogation-  reviewed in detail today,  See PACEART report.  Echo 07/10/22 TTE EF 30-35%   Monitors 08/10/2022 Sinus rhythm HR 56-146, avg 98. 9 episodes of SVT occurred, longest 45 seconds. Overall burden of ectopy was < 1% There were approximately 35 pages of patient-triggered events showing sinus rhythm and occasional ectopy.   CMRi 02/26/2023 Mild LVE with global hypokinesis, EF 34%. No delayed gadolinium uptake.  Estimated cardiac output 4.7 L/min.   Coronary CT 08/01/2022 1. Coronary calcium score of 141. This was 65 percentile for age-, sex, and race-matched controls. 2. Normal coronary origin with right dominance. 3. Mild CAD as outlined above. 4. Aortic atherosclerosis  Physical Exam:   VS:  LMP 04/05/2016 Comment: spotting 11/2016 and 12/2016   Wt Readings from Last 3 Encounters:  10/03/23 237 lb 6.4 oz (107.7 kg)  09/06/23 234 lb 12.8 oz (106.5 kg)  07/18/23 237 lb (107.5 kg)     GEN: Well nourished, well developed in no acute distress NECK: No JVD; No carotid bruits CARDIAC: {EPRHYTHM:28826}, no murmurs, rubs, gallops RESPIRATORY:  Clear to auscultation without rales, wheezing or  rhonchi  ABDOMEN: Soft, non-tender, non-distended EXTREMITIES:  No edema; No deformity   ASSESSMENT AND PLAN:    Chronic systolic dysfunction s/p Medtronic CRT-D  NICM euvolemic today Stable on an appropriate medical regimen Normal ICD function See Pace Art report No changes today Will update Echo now s/p CRT  LBBB Possible cause of cardiomyopathy with QRS > 150 pre CRT  PSVT Subclinical. Follow through device.    Disposition:   Follow up with {EPPROVIDERS:28135} {EPFOLLOW UP:28173}   Signed, Graciella Freer, PA-C

## 2023-10-09 NOTE — Progress Notes (Unsigned)
[name] [address] [place] [address] Phone: [phone]   Assessment and Plan:    1. Greater trochanteric bursitis of left hip 2. Muscle strain of left gluteal region, initial encounter 3. Gluteal pain  -Chronic with exacerbation, initial sports medicine visit - 3 months of lateral left hip pain most consistent with greater trochanteric bursitis based on HPI and physical exam - Patient elected for greater trochanteric CSI.  Tolerated well per note below.  Corticosteroid injection may temporarily increase blood glucose in patient with past medical history of DM type II - Start HEP for gluteal musculature - May use topical Voltaren gel over areas of pain  15 additional minutes spent for educating Therapeutic Home Exercise Program.  This included exercises focusing on stretching, strengthening, with focus on eccentric aspects.   Long term goals include an improvement in range of motion, strength, endurance as well as avoiding reinjury. Patient's frequency would include in 1-2 times a day, 3-5 times a week for a duration of 6-12 weeks. Proper technique shown and discussed handout in great detail with ATC.  All questions were discussed and answered.  Procedure: Greater trochanteric bursal injection Side: Left  Risks explained and consent was given verbally. The site was cleaned with alcohol prep. A steroid injection was performed with patient in the lateral side-lying position at area of maximum tenderness over greater trochanter using 2mL of 1% lidocaine without epinephrine and 1mL of kenalog 40mg /ml. This was well tolerated and resulted in symptomatic relief.  Needle was removed, hemostasis achieved, and post injection instructions were explained.  Pt was advised to call or return to clinic if these symptoms worsen or fail to improve as anticipated.    Pertinent previous records reviewed include PMNR note 09/06/2023   Follow  Up: 3 to 4 weeks for reevaluation.  If no improvement or worsening of symptoms, could consider physical therapy versus gluteal tendon CSI   Subjective:   I, [name], am serving as a Neurosurgeon for Doctor Richardean Sale  Chief Complaint: left glute pain   HPI:   10/10/2023 Patient is a 57year old female complaining of glute pain. Patient states that she has had pain for about 2-3 months. She was startled and fell on to a heater thinks that may be the cause of the pain. Hx of DDD has been using hydrocodone and that helps with the pain. Does endorse antalgic gait. Does endorse some numbness. She has constant pain     Relevant Historical Information: Hypertension, GERD, DM type II, history of breast cancer,  Additional pertinent review of systems negative.   Current Outpatient Medications:    apixaban (ELIQUIS) 5 MG TABS tablet, Take 1 tablet (5 mg total) by mouth 2 (two) times daily., Disp: 60 tablet, Rfl: 0   atorvastatin (LIPITOR) 40 MG tablet, TAKE 1 TABLET BY MOUTH EVERYDAY AT BEDTIME, Disp: 90 tablet, Rfl: 1   blood glucose meter kit and supplies, Dispense based on patient and insurance preference. Use up to four times daily as directed. (FOR ICD-10 E10.9, E11.9)., Disp: 1 each, Rfl: 5   Blood Glucose Monitoring Suppl DEVI, 1 each by Does not apply route 2 (two) times daily. May substitute to any manufacturer covered by patient's insurance., Disp: 1 each, Rfl: 0   Blood Pressure KIT, 1 Units by Does not apply route daily., Disp: 1 kit, Rfl: 0   cetirizine (ZYRTEC) 10 MG tablet, Take 1 tablet by mouth once daily, Disp: 60 tablet,  Rfl: 0   citalopram (CELEXA) 40 MG tablet, TAKE 1 TABLET BY MOUTH EVERY DAY, Disp: 90 tablet, Rfl: 2   dexlansoprazole (DEXILANT) 60 MG capsule, Take 1 capsule (60 mg total) by mouth daily., Disp: 90 capsule, Rfl: 3   Dulaglutide (TRULICITY) 0.75 MG/0.5ML SOPN, INJECT 0.75 MG SUBCUTANEOUSLY ONCE A WEEK, Disp: 6 mL, Rfl: 5   DULoxetine (CYMBALTA) 30 MG  capsule, Take 1 capsule (30 mg total) by mouth daily., Disp: 90 capsule, Rfl: 1   empagliflozin (JARDIANCE) 10 MG TABS tablet, Take 1 tablet (10 mg total) by mouth daily before breakfast., Disp: 90 tablet, Rfl: 3   ENTRESTO 24-26 MG, TAKE 1 TABLET BY MOUTH TWICE A DAY, Disp: 60 tablet, Rfl: 11   fluticasone (FLONASE) 50 MCG/ACT nasal spray, USE 2 SPRAY(S) IN EACH NOSTRIL ONCE DAILY AS NEEDED, Disp: 48 mL, Rfl: 1   Glucose Blood (BLOOD GLUCOSE TEST STRIPS) STRP, Inject 1 each into the skin 2 (two) times daily. May substitute to any manufacturer covered by patient's insurance, Disp: 100 strip, Rfl: 1   HYDROcodone-acetaminophen (NORCO) 5-325 MG tablet, Take 1 tablet by mouth every 6 (six) hours as needed for moderate pain (pain score 4-6). Next fill 10/06/23 for chronic pain, Disp: 120 tablet, Rfl: 0   metFORMIN (GLUCOPHAGE-XR) 750 MG 24 hr tablet, TAKE 1 TABLET BY MOUTH EVERY DAY WITH BREAKFAST, Disp: 90 tablet, Rfl: 1   metoprolol succinate (TOPROL XL) 50 MG 24 hr tablet, Take 1 tablet (50 mg total) by mouth daily., Disp: 30 tablet, Rfl: 11   pantoprazole (PROTONIX) 40 MG tablet, Take 1 tablet (40 mg total) by mouth 2 (two) times daily before a meal., Disp: 60 tablet, Rfl: 1   Vitamin D, Ergocalciferol, (DRISDOL) 1.25 MG (50000 UNIT) CAPS capsule, TAKE 1 CAPSULE BY MOUTH ONE TIME PER WEEK, Disp: 12 capsule, Rfl: 3   Objective:     Vitals:   10/10/23 0907  Pulse: 81  SpO2: 97%  Weight: 236 lb (107 kg)  Height: 5\' 4"  (1.626 m)      Body mass index is 40.51 kg/m.    Physical Exam:    General: awake, alert, and oriented no acute distress, nontoxic Skin: no suspicious lesions or rashes Neuro:sensation intact distally with no deficits, normal muscle tone, no atrophy, strength 5/5 in all tested lower ext groups Psych: normal mood and affect, speech clear   Left hip: No deformity, swelling or wasting ROM Flexion 90, ext 30, IR 45, ER 45 TTP significantly greater trochanter, moderately  gluteal tendon and musculature and IT band NTTP over the hip flexors,  si joint, lumbar spine Negative log roll with FROM Positive FABER for lateral hip pain Positive FADIR for lateral hip pain Positive piriformis test for gluteal strain and pain, negative for radicular symptoms   Gait normal    Electronically signed by:  [name] 9:25 AM 10/10/23

## 2023-10-10 ENCOUNTER — Encounter: Payer: Self-pay | Admitting: Student

## 2023-10-10 ENCOUNTER — Ambulatory Visit: Payer: Self-pay

## 2023-10-10 ENCOUNTER — Ambulatory Visit: Payer: Medicaid Other | Attending: Student | Admitting: Student

## 2023-10-10 ENCOUNTER — Ambulatory Visit: Payer: Medicaid Other | Admitting: Sports Medicine

## 2023-10-10 VITALS — BP 112/62 | HR 77 | Ht 64.0 in | Wt 236.0 lb

## 2023-10-10 VITALS — HR 81 | Ht 64.0 in | Wt 236.0 lb

## 2023-10-10 DIAGNOSIS — I5022 Chronic systolic (congestive) heart failure: Secondary | ICD-10-CM

## 2023-10-10 DIAGNOSIS — I447 Left bundle-branch block, unspecified: Secondary | ICD-10-CM

## 2023-10-10 DIAGNOSIS — M7062 Trochanteric bursitis, left hip: Secondary | ICD-10-CM | POA: Diagnosis not present

## 2023-10-10 DIAGNOSIS — M7918 Myalgia, other site: Secondary | ICD-10-CM

## 2023-10-10 DIAGNOSIS — I5020 Unspecified systolic (congestive) heart failure: Secondary | ICD-10-CM

## 2023-10-10 DIAGNOSIS — I428 Other cardiomyopathies: Secondary | ICD-10-CM

## 2023-10-10 DIAGNOSIS — S76012A Strain of muscle, fascia and tendon of left hip, initial encounter: Secondary | ICD-10-CM | POA: Diagnosis not present

## 2023-10-10 LAB — CUP PACEART INCLINIC DEVICE CHECK
Battery Remaining Longevity: 128 mo
Battery Voltage: 3.04 V
Brady Statistic AP VP Percent: 0.03 %
Brady Statistic AP VS Percent: 0.01 %
Brady Statistic AS VP Percent: 98.53 %
Brady Statistic AS VS Percent: 1.43 %
Brady Statistic RA Percent Paced: 0.04 %
Brady Statistic RV Percent Paced: 4.43 %
Date Time Interrogation Session: 20241121092126
HighPow Impedance: 68 Ohm
Implantable Lead Connection Status: 753985
Implantable Lead Connection Status: 753985
Implantable Lead Connection Status: 753985
Implantable Lead Implant Date: 20240604
Implantable Lead Implant Date: 20240604
Implantable Lead Implant Date: 20240604
Implantable Lead Location: 753858
Implantable Lead Location: 753859
Implantable Lead Location: 753860
Implantable Lead Model: 4598
Implantable Lead Model: 5076
Implantable Pulse Generator Implant Date: 20240604
Lead Channel Impedance Value: 1045 Ohm
Lead Channel Impedance Value: 1197 Ohm
Lead Channel Impedance Value: 1235 Ohm
Lead Channel Impedance Value: 361 Ohm
Lead Channel Impedance Value: 361 Ohm
Lead Channel Impedance Value: 532 Ohm
Lead Channel Impedance Value: 551 Ohm
Lead Channel Impedance Value: 646 Ohm
Lead Channel Impedance Value: 703 Ohm
Lead Channel Impedance Value: 722 Ohm
Lead Channel Impedance Value: 760 Ohm
Lead Channel Impedance Value: 798 Ohm
Lead Channel Impedance Value: 836 Ohm
Lead Channel Pacing Threshold Amplitude: 0.5 V
Lead Channel Pacing Threshold Amplitude: 0.625 V
Lead Channel Pacing Threshold Amplitude: 0.75 V
Lead Channel Pacing Threshold Amplitude: 0.75 V
Lead Channel Pacing Threshold Amplitude: 0.75 V
Lead Channel Pacing Threshold Amplitude: 0.75 V
Lead Channel Pacing Threshold Pulse Width: 0.4 ms
Lead Channel Pacing Threshold Pulse Width: 0.4 ms
Lead Channel Pacing Threshold Pulse Width: 0.4 ms
Lead Channel Pacing Threshold Pulse Width: 0.4 ms
Lead Channel Pacing Threshold Pulse Width: 0.4 ms
Lead Channel Pacing Threshold Pulse Width: 0.4 ms
Lead Channel Sensing Intrinsic Amplitude: 10.5 mV
Lead Channel Sensing Intrinsic Amplitude: 2.5 mV
Lead Channel Setting Pacing Amplitude: 1.25 V
Lead Channel Setting Pacing Amplitude: 1.5 V
Lead Channel Setting Pacing Amplitude: 2 V
Lead Channel Setting Pacing Pulse Width: 0.4 ms
Lead Channel Setting Pacing Pulse Width: 0.4 ms
Lead Channel Setting Sensing Sensitivity: 0.3 mV
Zone Setting Status: 755011
Zone Setting Status: 755011
Zone Setting Status: 755011

## 2023-10-10 NOTE — Patient Instructions (Signed)
Glute HEP  Voltaren gel over areas of pain  3-4 week follow up

## 2023-10-10 NOTE — Patient Instructions (Signed)
Medication Instructions:  Your physician recommends that you continue on your current medications as directed. Please refer to the Current Medication list given to you today.  *If you need a refill on your cardiac medications before your next appointment, please call your pharmacy*  Lab Work: None ordered If you have labs (blood work) drawn today and your tests are completely normal, you will receive your results only by: MyChart Message (if you have MyChart) OR A paper copy in the mail If you have any lab test that is abnormal or we need to change your treatment, we will call you to review the results.  Testing/Procedures: Your physician has requested that you have an echocardiogram. Echocardiography is a painless test that uses sound waves to create images of your heart. It provides your doctor with information about the size and shape of your heart and how well your heart's chambers and valves are working. This procedure takes approximately one hour. There are no restrictions for this procedure. Please do NOT wear cologne, perfume, aftershave, or lotions (deodorant is allowed). Please arrive 15 minutes prior to your appointment time.  Please note: We ask at that you not bring children with you during ultrasound (echo/ vascular) testing. Due to room size and safety concerns, children are not allowed in the ultrasound rooms during exams. Our front office staff cannot provide observation of children in our lobby area while testing is being conducted. An adult accompanying a patient to their appointment will only be allowed in the ultrasound room at the discretion of the ultrasound technician under special circumstances. We apologize for any inconvenience.   Follow-Up: At [facility], you and your health needs are our priority.  As part of our continuing mission to provide you with exceptional heart care, we have created designated Provider Care Teams.  These Care Teams include your  primary Cardiologist (physician) and Advanced Practice Providers (APPs -  Physician Assistants and Nurse Practitioners) who all work together to provide you with the care you need, when you need it.  Your next appointment:   6 month(s)  Provider:   York Pellant, MD

## 2023-10-12 ENCOUNTER — Ambulatory Visit (HOSPITAL_BASED_OUTPATIENT_CLINIC_OR_DEPARTMENT_OTHER)
Admission: RE | Admit: 2023-10-12 | Discharge: 2023-10-12 | Disposition: A | Discharge status: Home / Self Care | Payer: Medicaid Other | Source: Ambulatory Visit | Attending: Gastroenterology | Admitting: Gastroenterology

## 2023-10-12 DIAGNOSIS — K5730 Diverticulosis of large intestine without perforation or abscess without bleeding: Secondary | ICD-10-CM | POA: Diagnosis not present

## 2023-10-12 DIAGNOSIS — I700 Atherosclerosis of aorta: Secondary | ICD-10-CM | POA: Diagnosis not present

## 2023-10-12 DIAGNOSIS — I701 Atherosclerosis of renal artery: Secondary | ICD-10-CM | POA: Diagnosis not present

## 2023-10-12 DIAGNOSIS — R1033 Periumbilical pain: Secondary | ICD-10-CM

## 2023-10-12 DIAGNOSIS — R109 Unspecified abdominal pain: Secondary | ICD-10-CM | POA: Diagnosis not present

## 2023-10-12 MED ORDER — IOHEXOL 350 MG/ML SOLN
100.0000 mL | Freq: Once | INTRAVENOUS | Status: AC | PRN
  Administered 2023-10-12: 100 mL via INTRAVENOUS

## 2023-10-15 LAB — POCT I-STAT CREATININE: Creatinine, Ser: 0.9 mg/dL (ref 0.44–1.00)

## 2023-10-16 ENCOUNTER — Other Ambulatory Visit: Payer: Self-pay | Admitting: Family Medicine

## 2023-10-16 ENCOUNTER — Telehealth: Payer: Self-pay | Admitting: *Deleted

## 2023-10-16 DIAGNOSIS — J302 Other seasonal allergic rhinitis: Secondary | ICD-10-CM

## 2023-10-16 NOTE — Telephone Encounter (Signed)
Spoke with patient. Patient is Postmenopausal, reports menses like cramps for 3-4 days followed by spotting. Denies any other GYN symptoms. OV scheduled for 12/11 at 1415.   ER precautions reviewed for heavy bleeding -changing saturated pad q1-2 hours, new or worsening symptoms.   Advised I will send to Dr. Edward Jolly to review and our office will return call if any additional recommendations. Patient agreeable.   Patient added to waitlist.   Dr. Edward Jolly -patient is scheduled in 15 min OV. Please advise if you have any other recommendations.

## 2023-10-16 NOTE — Telephone Encounter (Signed)
Ok for appointment as is.

## 2023-10-16 NOTE — Progress Notes (Signed)
GYNECOLOGY  VISIT   HPI: 57 y.o.   Married  Caucasian female   [phone] with Patient's last menstrual period was 04/05/2016.   here for endometrial biopsy for postmenopausal bleeding.   Bleeding stopped with Provera 10 mg daily.    Has Rx for 2 weeks.    Study Result  Narrative & Impression  CLINICAL DATA:  57 year old female with postmenopausal bleeding.   EXAM: TRANSABDOMINAL AND TRANSVAGINAL ULTRASOUND OF PELVIS   TECHNIQUE: Both transabdominal and transvaginal ultrasound examinations of the pelvis were performed. Transabdominal technique was performed for global imaging of the pelvis including uterus, ovaries, adnexal regions, and pelvic cul-de-sac. It was necessary to proceed with endovaginal exam following the transabdominal exam to visualize the endometrium and ovaries.   COMPARISON:  None Available.   FINDINGS: Uterus   Measurements: 8.1 x 4.4 x 6.4 cm = volume: 119.0 mL. No fibroids or other mass visualized.   Endometrium   Thickness: 6.2 mm.  Trace fluid noted within the endometrial cavity.   Right ovary   Measurements: 1.8 x 1.2 x 2.0 cm = volume: 2.2 mL. Normal appearance/no adnexal mass.   Left ovary   Measurements: 2.3 x 1.6 x 2.4 cm = volume: 4.5 mL. Normal appearance/no adnexal mass.   Other findings   No abnormal free fluid.   IMPRESSION: 1. The endometrium is abnormal for a postmenopausal female measuring 6.2 mm in maximum thickness. In the setting of post-menopausal bleeding, endometrial sampling is indicated to exclude carcinoma. If results are benign, sonohysterogram should be considered for focal lesion work-up. (Ref: Radiological Reasoning: Algorithmic Workup of Abnormal Vaginal Bleeding with Endovaginal Sonography and Sonohysterography. AJR 2008; [id number]:[id number])     Electronically Signed   By: Signa Kell M.D.   On: 10/24/2023 18:28      GYNECOLOGIC HISTORY: Patient's last menstrual period was 04/05/2016. Contraception:   PMP Menopausal hormone therapy:  n/a Last 2 paps:  01/04/22 neg: HR HPV neg, 01/31/18 neg: HR HPV neg  History of abnormal Pap or positive HPV:  yes, 09-08-14 Ascus:Pos HR HPV with colposcopy revealing LGSIL on exocervical biopsy/2010 abnormal pap with colposcopy but no treatment to cervix.  Repeat pap was normal.  1990's had colposcopy with LEEP procedure to cervix.  MMG:  05/03/22 Breast Density Cat B, BI-RADS CAT 1 neg Mammogram:   05/03/22 Breast Density Cat B, BI-RADS CAT 1 neg         OB History     Gravida  4   Para  3   Term  0   Preterm  0   AB  1   Living  3      SAB  0   IAB  0   Ectopic  0   Multiple      Live Births  3              Patient Active Problem List   Diagnosis Date Noted   Lumbar radiculopathy 09/07/2022   NICM (nonischemic cardiomyopathy) (HCC) 08/29/2022   CAD (coronary artery disease) 08/29/2022   Aortic atherosclerosis (HCC) 08/29/2022   Lipoma 05/16/2022   Syncope 05/02/2022   Chronic pain syndrome 05/01/2022   LBBB (left bundle branch block) 05/01/2022   Diabetes mellitus without complication (HCC) 12/22/2020   Abdominal pain 12/22/2019   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   Fatty liver 09/06/2016   H/O adenomatous polyp of colon 01/24/2012   FH: colon cancer 01/24/2012   GERD (gastroesophageal reflux disease) 06/22/2011  Hyperlipidemia 06/22/2011   Essential hypertension 06/22/2011   Obesity 06/22/2011   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    Past Medical History:  Diagnosis Date   Anxiety    Arthritis    Blood transfusion without reported diagnosis 1999   due to heavy menses   Breast cancer (HCC) 05/22/2011   03/01/11, Stage 2, s/p lumpectomy, chemo/xrt   CHF (congestive heart failure) (HCC)    Complication of anesthesia    pt woke up during procedure   DDD (degenerative disc disease), lumbar    Depression 06/22/2011   Diverticulitis    DM  (diabetes mellitus) (HCC) 06/22/2011   Genital warts    GERD (gastroesophageal reflux disease) 06/22/2011   Hx of adenomatous colonic polyps 10/2007   4mm sigmoid tubular adenoma, FH colon cancer, mother in mid-50s   Hypercholesterolemia 06/22/2011   Hypertension 06/22/2011   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   Iron deficiency anemia 03/25/2015   LBBB (left bundle branch block)    Mild CAD    Morbid obesity (HCC)    NICM (nonischemic cardiomyopathy) (HCC)    Personal history of chemotherapy    Personal history of radiation therapy    PSVT (paroxysmal supraventricular tachycardia) (HCC)    Shortness of breath    STD (sexually transmitted disease)    HPV, 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   Syncope    Syncope    Vaginal bleeding 03/08/2014    Past Surgical History:  Procedure Laterality Date   back surg x2     BALLOON DILATION N/A 09/22/2021   Procedure: BALLOON DILATION;  Surgeon: Lanelle Bal, DO;  Location: [facility];  Service: Endoscopy;  Laterality: N/A;   BIOPSY  09/22/2021   Procedure: BIOPSY;  Surgeon: Lanelle Bal, DO;  [facility]: [facility];  Service: Endoscopy;;   BIV ICD INSERTION CRT-D N/A 04/23/2023   Procedure: BIV ICD INSERTION CRT-D;  Surgeon: Maurice Small, MD;  Location: [facility];  Service: Cardiovascular;  Laterality: N/A;   BREAST LUMPECTOMY Right 2012   CHOLECYSTECTOMY     COLONOSCOPY  11/03/07   4-mm sessile polyp removed/small internal hemorrhoids/tubular adenoma, random colon bx negative for microscopic colitis   COLONOSCOPY WITH PROPOFOL N/A 09/17/2016   Grade 2 hemorrhoids, colonic diverticulosis, ascending colon polyp (sessile serrated adenoma), 5 year surveillance   COLONOSCOPY WITH PROPOFOL N/A 09/22/2021   Procedure: COLONOSCOPY WITH PROPOFOL;  Surgeon: Lanelle Bal, DO;  [facility]: [facility];  Service: Endoscopy;  Laterality: N/A;  7:30am   COLONOSCOPY, ESOPHAGOGASTRODUODENOSCOPY (EGD) AND  ESOPHAGEAL DILATION  01/2012   mild gastritis s/p biopsy. Empiric dilation. Normal duodenum.    DILATATION & CURETTAGE/HYSTEROSCOPY WITH MYOSURE N/A 02/10/2015   Procedure: DILATATION & CURETTAGE/HYSTEROSCOPY WITH MYOSURE;  Surgeon: Patton Salles, MD;  Location: [facility];  Service: Gynecology;  Laterality: N/A;   ESOPHAGOGASTRODUODENOSCOPY (EGD) WITH PROPOFOL N/A 01/11/2020   Normal esophagus, s/p dilation, normal stomach, normal duodenum.    ESOPHAGOGASTRODUODENOSCOPY (EGD) WITH PROPOFOL N/A 09/22/2021   Procedure: ESOPHAGOGASTRODUODENOSCOPY (EGD) WITH PROPOFOL;  Surgeon: Lanelle Bal, DO;  [facility];  Service: Endoscopy;  Laterality: N/A;   MALONEY DILATION N/A 01/11/2020   Procedure: Elease Hashimoto DILATION;  Surgeon: Corbin Ade, MD;  [facility]: [facility];  Service: Endoscopy;  Laterality: N/A;   MM BREAST STEREO BX*L*R/S     rt.   POLYPECTOMY  09/17/2016   Procedure: POLYPECTOMY;  Surgeon: Corbin Ade, MD;  [facility]: [facility];  Service: Endoscopy;;  ascending colon   POLYPECTOMY  09/22/2021   Procedure: POLYPECTOMY;  Surgeon: Lanelle Bal, DO;  [facility]: [facility];  Service: Endoscopy;;   PORT-A-CATH REMOVAL  05/26/2012   Procedure: REMOVAL PORT-A-CATH;  Surgeon: [name], MD;  Location: [facility];  Service: General;  Laterality: N/A;  Minor Room   PORTACATH PLACEMENT      Current Outpatient Medications  Medication Sig Dispense Refill   apixaban (ELIQUIS) 5 MG TABS tablet Take 1 tablet (5 mg total) by mouth 2 (two) times daily. 60 tablet 0   atorvastatin (LIPITOR) 40 MG tablet TAKE 1 TABLET BY MOUTH AT BEDTIME 90 tablet 0   blood glucose meter kit and supplies Dispense based on patient and insurance preference. Use up to four times daily as directed. (FOR ICD-10 E10.9, E11.9). 1 each 5   Blood Glucose Monitoring Suppl DEVI 1 each by Does not apply route 2 (two) times daily. May substitute to any manufacturer covered by patient's insurance. 1  each 0   Blood Pressure KIT 1 Units by Does not apply route daily. 1 kit 0   cetirizine (ZYRTEC) 10 MG tablet Take 1 tablet by mouth once daily 60 tablet 0   citalopram (CELEXA) 40 MG tablet TAKE 1 TABLET BY MOUTH EVERY DAY 90 tablet 2   dexlansoprazole (DEXILANT) 60 MG capsule Take 1 capsule (60 mg total) by mouth daily. 90 capsule 3   Dulaglutide (TRULICITY) 0.75 MG/0.5ML SOPN INJECT 0.75 MG SUBCUTANEOUSLY ONCE A WEEK 6 mL 5   DULoxetine (CYMBALTA) 30 MG capsule Take 1 capsule (30 mg total) by mouth daily. 90 capsule 1   empagliflozin (JARDIANCE) 10 MG TABS tablet Take 1 tablet (10 mg total) by mouth daily before breakfast. 90 tablet 3   ENTRESTO 24-26 MG TAKE 1 TABLET BY MOUTH TWICE A DAY 60 tablet 11   fluticasone (FLONASE) 50 MCG/ACT nasal spray USE 2 SPRAY(S) IN EACH NOSTRIL ONCE DAILY AS NEEDED 48 g 0   HYDROcodone-acetaminophen (NORCO) 5-325 MG tablet Take 1 tablet by mouth every 6 (six) hours as needed for moderate pain (pain score 4-6). Next fill 10/06/23 for chronic pain 120 tablet 0   medroxyPROGESTERone (PROVERA) 10 MG tablet Take 1 tablet (10 mg total) by mouth daily. Take for 14 days. 14 tablet 0   metFORMIN (GLUCOPHAGE-XR) 750 MG 24 hr tablet Take 1 tablet by mouth once daily with breakfast 90 tablet 0   metoprolol succinate (TOPROL XL) 50 MG 24 hr tablet Take 1 tablet (50 mg total) by mouth daily. 30 tablet 11   ondansetron (ZOFRAN-ODT) 4 MG disintegrating tablet Take 1 tablet (4 mg total) by mouth every 8 (eight) hours as needed for nausea or vomiting. 20 tablet 0   pantoprazole (PROTONIX) 40 MG tablet Take 1 tablet (40 mg total) by mouth 2 (two) times daily before a meal. 60 tablet 1   Vitamin D, Ergocalciferol, (DRISDOL) 1.25 MG (50000 UNIT) CAPS capsule TAKE 1 CAPSULE BY MOUTH ONE TIME PER WEEK 12 capsule 3   No current facility-administered medications for this visit.     ALLERGIES: Flagyl [metronidazole], Lansoprazole, and Pravastatin  Family History  Problem Relation  Age of Onset   Colon cancer Mother        mid-50s, died of metastatic disease   Cancer Mother        liver   Coronary artery disease Mother    Diabetes type I Mother    Peripheral vascular disease  Mother        Carotid disease in her 36s   Coronary artery disease Father        CAD in his 73s   Diabetes Father    Diabetes type I Father    Hypertension Father    Bipolar disorder Sister    Coronary artery disease Brother        MI in his 77s   Hypertension Brother    Hypertension Maternal Grandmother    Hyperlipidemia Maternal Grandmother    Stroke Maternal Grandmother    Hypertension Maternal Grandfather    Diabetes Maternal Grandfather    Diabetes Paternal Grandmother    Heart disease Paternal Grandfather     Social History   Socioeconomic History   Marital status: Married    Spouse name: Not on file   Number of children: 3   Years of education: Not on file   Highest education level: Some college, no degree  Occupational History   Occupation: child care    Employer: [employer]  Tobacco Use   Smoking status: Never   Smokeless tobacco: Never  Vaping Use   Vaping status: Never Used  Substance and Sexual Activity   Alcohol use: No    Alcohol/week: 0.0 standard drinks of alcohol   Drug use: No   Sexual activity: Yes    Partners: Male    Birth control/protection: Post-menopausal  Other Topics Concern   Not on file  Social History Narrative   ** Merged History Encounter **       Social Determinants of Health   Financial Resource Strain: Medium Risk (03/11/2023)   Overall Financial Resource Strain (CARDIA)    Difficulty of Paying Living Expenses: Somewhat hard  Food Insecurity: Food Insecurity Present (03/11/2023)   Hunger Vital Sign    Worried About Running Out of Food in the Last Year: Sometimes true    Ran Out of Food in the Last Year: Sometimes true  Transportation Needs: No Transportation Needs (03/11/2023)   PRAPARE - Scientist, research (physical sciences) (Medical): No    Lack of Transportation (Non-Medical): No  Physical Activity: Unknown (03/11/2023)   Exercise Vital Sign    Days of Exercise per Week: Patient declined    Minutes of Exercise per Session: Not on file  Stress: Stress Concern Present (03/11/2023)   [employer] of Occupational Health - Occupational Stress Questionnaire    Feeling of Stress : To some extent  Social Connections: Moderately Isolated (03/11/2023)   Social Connection and Isolation Panel [NHANES]    Frequency of Communication with Friends and Family: More than three times a week    Frequency of Social Gatherings with Friends and Family: Twice a week    Attends Religious Services: Never    Database administrator or Organizations: No    Attends Engineer, structural: Not on file    Marital Status: Married  Catering manager Violence: Not on file    Review of Systems  Genitourinary:  Positive for vaginal bleeding.  All other systems reviewed and are negative.   PHYSICAL EXAMINATION:   BP 130/80 (BP Location: Left Arm, Patient Position: Sitting, Cuff Size: Large)   Pulse 74   Ht 5\' 3"  (1.6 m)   Wt 229 lb (103.9 kg)   LMP 04/05/2016 Comment: spotting 11/2016 and 12/2016  SpO2 99%   BMI 40.57 kg/m     General appearance: alert, cooperative and appears stated age  Pelvic: External genitalia:  no lesions  Urethra:  normal appearing urethra with no masses, tenderness or lesions              Bartholins and Skenes: normal                 Vagina: normal appearing vagina with normal color and discharge, no lesions              Cervix: no lesions.  Cervix is short.                 EMB: Consent for procedure. Sterile prep with   Paracervical block with 1% lidocaine 10 cc, lot number    [id number], expiration 07/2025 Tenaculum to anterior cervical lip. Pipelle passed to     6.5     cm twice.   Tissue to pathology.  Minimal EBL. No complications.   Chaperone was present for  exam:  Warren Lacy, CMA  ASSESSMENT:  Postmenopausal bleeding.  Thickened endometrium with fluid in the endometrial canal.  On Provera.  Hx LEEP.  PLAN:  [place] reviewed.  Etiologies of bleeding and endometrial thickening discussed:  polyp, hyperplasia, carcinoma.  FU EMB.  Continue Provera.  I discussed potential sonohysterogram if EMB is negative and normal.  20 min  total time was spent for this patient encounter, including preparation, face-to-face counseling with the patient, coordination of care, and documentation of the encounter in addition to doing the endometrial biopsy.

## 2023-10-16 NOTE — Telephone Encounter (Signed)
Reviewed and encounter closed.

## 2023-10-20 DIAGNOSIS — Z419 Encounter for procedure for purposes other than remedying health state, unspecified: Secondary | ICD-10-CM | POA: Diagnosis not present

## 2023-10-21 ENCOUNTER — Telehealth: Payer: Self-pay | Admitting: *Deleted

## 2023-10-21 ENCOUNTER — Other Ambulatory Visit: Payer: Self-pay | Admitting: Family Medicine

## 2023-10-21 MED ORDER — ONDANSETRON 4 MG PO TBDP: 4.0000 mg | ORAL_TABLET | Freq: Three times a day (TID) | ORAL | 0 refills | Status: DC | PRN

## 2023-10-21 NOTE — Telephone Encounter (Signed)
Patient left voicemail on Triage line stating she did not feel good.   Returned call to patient. Patient states that she is still bleeding and now experiencing dizziness and nausea. States she is changing her pad "every hour on the hour" and having fifty cent piece sized clots. States she just feels weak. Experiencing moderate cramping, but takes hydrocodone 3 times a day for back pain, so cramping is tolerable. Advised Dr. Edward Jolly is out of the office today, but would review with covering provider and return call. Patient agreeable.   Dr. Kennith Center- please advise on appointment time for your schedule today.

## 2023-10-21 NOTE — Telephone Encounter (Signed)
Copied from CRM [id number]. Topic: Clinical - Medication Question >> Oct 21, 2023  9:14 AM [name] wrote: Reason for CRM: PT is requesting a refill on her Ondansetron as her Trulicity shots cause her extreme nausea. PT does not kno why Dr. Adriana Simas refused this medication refill. Please advise her on this.

## 2023-10-21 NOTE — Telephone Encounter (Signed)
Dr. Kennith Center -f/u on request below. Irving Burton is out of the office this afternoon.

## 2023-10-21 NOTE — Telephone Encounter (Signed)
Patient notified

## 2023-10-21 NOTE — Telephone Encounter (Signed)
Tommie Sams, DO     I sent it in. It was not on her list of medications.

## 2023-10-22 NOTE — Telephone Encounter (Signed)
Please have patient see me tomorrow at 11:30 am.

## 2023-10-22 NOTE — Telephone Encounter (Signed)
 Left message to call [facility] Triage at [phone], option 4.

## 2023-10-22 NOTE — Telephone Encounter (Signed)
Message transferred from Appt desk to surgery line.   Call returned to patient. Patient reports bleeding still heavy with clots, dizziness and lightheadedness. Patient lives in [place], unable to get to office for 4pm appt today.  Instructed patient to go directly to ER for further evaluation. ER will be able to complete PUS if needed. OV scheduled for tomorrow with Dr. Kennith Center for f/u at 1430. Patient will call with update, can make any changes to upcoming visits based on evaluation. Also has OV scheduled for 12/11 with Dr. Edward Jolly. Will keep both visits until f/u determined. Questions answered. Patient verbalizes understanding and is agreeable. Patient appreciative of return call. Advised will update both providers and return call if any additional recommendations.   Routing FYI. Will hold encounter to f/u with patient on 12/4  Cc: Dr. Edward Jolly

## 2023-10-22 NOTE — Telephone Encounter (Signed)
Spoke with patient. Patient unable to schedule for 11:30, scheduled for 12/4 at 1400. ER precautions reviewed. Patient verbalizes understanding and is agreeable.   Routing to provider for final review. Patient is agreeable to disposition. Will close encounter.

## 2023-10-23 ENCOUNTER — Telehealth: Payer: Self-pay | Admitting: Obstetrics and Gynecology

## 2023-10-23 ENCOUNTER — Ambulatory Visit (INDEPENDENT_AMBULATORY_CARE_PROVIDER_SITE_OTHER): Payer: Medicaid Other

## 2023-10-23 ENCOUNTER — Ambulatory Visit: Payer: Medicaid Other | Admitting: Obstetrics and Gynecology

## 2023-10-23 ENCOUNTER — Ambulatory Visit: Payer: Self-pay | Admitting: Obstetrics and Gynecology

## 2023-10-23 ENCOUNTER — Encounter: Payer: Self-pay | Admitting: Obstetrics and Gynecology

## 2023-10-23 VITALS — BP 128/86 | HR 90 | Ht 63.0 in | Wt 229.0 lb

## 2023-10-23 DIAGNOSIS — N939 Abnormal uterine and vaginal bleeding, unspecified: Secondary | ICD-10-CM

## 2023-10-23 DIAGNOSIS — I428 Other cardiomyopathies: Secondary | ICD-10-CM | POA: Diagnosis not present

## 2023-10-23 MED ORDER — MEDROXYPROGESTERONE ACETATE 10 MG PO TABS: 10.0000 mg | ORAL_TABLET | Freq: Every day | ORAL | 0 refills | Status: DC

## 2023-10-23 NOTE — Telephone Encounter (Signed)
Spoke with patient. Patient request to schedule at [facility] after 1330.   Call placed to [facility] Scheduling, spoke with [facility]. Patient scheduled for STAT PUS on 12/5 at 1430, arriving at 1415 with a full bladder.   Patient notified. Appt for 12/11 updated to EMB. Patient verbalizes understanding and is agreeable to plan.    Dr. Edward Jolly -do you want this report called to you? Or reviewed by one of the providers in office on 12/5?

## 2023-10-23 NOTE — Telephone Encounter (Signed)
STAT PUS order placed to be scheduled at outside imaging facility.   Call placed to patient, left detailed message requesting return call to discuss scheduling availability, [phone], option 4. Can also contact [facility] at [phone] to schedule PUS directly. Let me know how you would like to proceed.

## 2023-10-23 NOTE — Progress Notes (Signed)
GYNECOLOGY  VISIT   HPI: 57 y.o.   Married  Caucasian female   [phone] with [name]'s last menstrual period was 04/05/2016.   here for: bleeding. Pt has had a full menstrual flow with clots since Tuesday/ Pt has had menstrual cramps and very sharp pain in the left ovary before on Monday. Pt is struggling to eat and feels weak.  Changing a pad every hour.    Having a lot of cramping.   She has not had vaginal bleeding for years.   FSH 22.7 on estradiol 18 on 01/04/22.  Having hot flashes.  Off Eliquis.  Had a pacemaker placed.    GYNECOLOGIC HISTORY: [name]'s last menstrual period was 04/05/2016. Contraception:  PMP Menopausal hormone therapy:  n/a Last 2 paps:   01/04/22 neg: HR HPV neg, 01/31/18 neg: HR HPV neg  History of abnormal Pap or positive HPV:  yes, 09-08-14 Ascus:Pos HR HPV with colposcopy revealing LGSIL on exocervical biopsy/2010 abnormal pap with colposcopy but no treatment to cervix.  Repeat pap was normal.  1990's had colposcopy with LEEP procedure to cervix.  Mammogram:  06/13/23 Breast Density Cat B, BI-RADS CAT 1 neg         OB History     Gravida  4   Para  3   Term  0   Preterm  0   AB  1   Living  3      SAB  0   IAB  0   Ectopic  0   Multiple      Live Births  3              [name] Active Problem List   Diagnosis Date Noted   Lumbar radiculopathy 09/07/2022   NICM (nonischemic cardiomyopathy) (HCC) 08/29/2022   CAD (coronary artery disease) 08/29/2022   Aortic atherosclerosis (HCC) 08/29/2022   Lipoma 05/16/2022   Syncope 05/02/2022   Chronic pain syndrome 05/01/2022   LBBB (left bundle branch block) 05/01/2022   Diabetes mellitus without complication (HCC) 12/22/2020   Abdominal pain 12/22/2019   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   Fatty liver 09/06/2016   H/O adenomatous polyp of colon 01/24/2012   FH: colon cancer 01/24/2012   GERD (gastroesophageal reflux  disease) 06/22/2011   Hyperlipidemia 06/22/2011   Essential hypertension 06/22/2011   Obesity 06/22/2011   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    Past Medical History:  Diagnosis Date   Anxiety    Arthritis    Blood transfusion without reported diagnosis 1999   due to heavy menses   Breast cancer (HCC) 05/22/2011   03/01/11, Stage 2, s/p lumpectomy, chemo/xrt   CHF (congestive heart failure) (HCC)    Complication of anesthesia    pt woke up during procedure   DDD (degenerative disc disease), lumbar    Depression 06/22/2011   Diverticulitis    DM (diabetes mellitus) (HCC) 06/22/2011   Genital warts    GERD (gastroesophageal reflux disease) 06/22/2011   Hx of adenomatous colonic polyps 10/2007   4mm sigmoid tubular adenoma, FH colon cancer, mother in mid-50s   Hypercholesterolemia 06/22/2011   Hypertension 06/22/2011   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   Iron deficiency anemia 03/25/2015   LBBB (left bundle branch block)    Mild CAD    Morbid obesity (HCC)    NICM (nonischemic cardiomyopathy) (HCC)    Personal history of chemotherapy    Personal history of  radiation therapy    PSVT (paroxysmal supraventricular tachycardia) (HCC)    Shortness of breath    STD (sexually transmitted disease)    HPV, 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   Syncope    Syncope    Vaginal bleeding 03/08/2014    Past Surgical History:  Procedure Laterality Date   back surg x2     BALLOON DILATION N/A 09/22/2021   Procedure: BALLOON DILATION;  Surgeon: Lanelle Bal, DO;  [facility]: [facility];  Service: Endoscopy;  Laterality: N/A;   BIOPSY  09/22/2021   Procedure: BIOPSY;  Surgeon: Lanelle Bal, DO;  Location: [facility];  Service: Endoscopy;;   BIV ICD INSERTION CRT-D N/A 04/23/2023   Procedure: BIV ICD INSERTION CRT-D;  Surgeon: Maurice Small, MD;  Location: [facility] INVASIVE CV LAB;  Service: Cardiovascular;  Laterality: N/A;   BREAST  LUMPECTOMY Right 2012   CHOLECYSTECTOMY     COLONOSCOPY  11/03/07   4-mm sessile polyp removed/small internal hemorrhoids/tubular adenoma, random colon bx negative for microscopic colitis   COLONOSCOPY WITH PROPOFOL N/A 09/17/2016   Grade 2 hemorrhoids, colonic diverticulosis, ascending colon polyp (sessile serrated adenoma), 5 year surveillance   COLONOSCOPY WITH PROPOFOL N/A 09/22/2021   Procedure: COLONOSCOPY WITH PROPOFOL;  Surgeon: Lanelle Bal, DO;  [facility]: [facility];  Service: Endoscopy;  Laterality: N/A;  7:30am   COLONOSCOPY, ESOPHAGOGASTRODUODENOSCOPY (EGD) AND ESOPHAGEAL DILATION  01/2012   mild gastritis s/p biopsy. Empiric dilation. Normal duodenum.    DILATATION & CURETTAGE/HYSTEROSCOPY WITH MYOSURE N/A 02/10/2015   Procedure: DILATATION & CURETTAGE/HYSTEROSCOPY WITH MYOSURE;  Surgeon: Patton Salles, MD;  Location: [facility] ORS;  Service: Gynecology;  Laterality: N/A;   ESOPHAGOGASTRODUODENOSCOPY (EGD) WITH PROPOFOL N/A 01/11/2020   Normal esophagus, s/p dilation, normal stomach, normal duodenum.    ESOPHAGOGASTRODUODENOSCOPY (EGD) WITH PROPOFOL N/A 09/22/2021   Procedure: ESOPHAGOGASTRODUODENOSCOPY (EGD) WITH PROPOFOL;  Surgeon: Lanelle Bal, DO;  [facility];  Service: Endoscopy;  Laterality: N/A;   MALONEY DILATION N/A 01/11/2020   Procedure: Elease Hashimoto DILATION;  Surgeon: Corbin Ade, MD;  Location: [facility];  Service: Endoscopy;  Laterality: N/A;   MM BREAST STEREO BX*L*R/S     rt.   POLYPECTOMY  09/17/2016   Procedure: POLYPECTOMY;  Surgeon: Corbin Ade, MD;  Location: [facility];  Service: Endoscopy;;  ascending colon   POLYPECTOMY  09/22/2021   Procedure: POLYPECTOMY;  Surgeon: Lanelle Bal, DO;  Location: [facility];  Service: Endoscopy;;   PORT-A-CATH REMOVAL  05/26/2012   Procedure: REMOVAL PORT-A-CATH;  Surgeon: [name], MD;  Location: [facility];  Service: General;  Laterality: N/A;  Minor Room   PORTACATH PLACEMENT       Current Outpatient Medications  Medication Sig Dispense Refill   apixaban (ELIQUIS) 5 MG TABS tablet Take 1 tablet (5 mg total) by mouth 2 (two) times daily. 60 tablet 0   atorvastatin (LIPITOR) 40 MG tablet TAKE 1 TABLET BY MOUTH AT BEDTIME 90 tablet 0   blood glucose meter kit and supplies Dispense based on [name] and insurance preference. Use up to four times daily as directed. (FOR ICD-10 E10.9, E11.9). 1 each 5   Blood Glucose Monitoring Suppl DEVI 1 each by Does not apply route 2 (two) times daily. May substitute to any manufacturer covered by [name]'s insurance. 1 each 0   Blood Pressure KIT 1 Units by Does not apply route daily. 1 kit 0   cetirizine (ZYRTEC) 10 MG tablet  Take 1 tablet by mouth once daily 60 tablet 0   citalopram (CELEXA) 40 MG tablet TAKE 1 TABLET BY MOUTH EVERY DAY 90 tablet 2   dexlansoprazole (DEXILANT) 60 MG capsule Take 1 capsule (60 mg total) by mouth daily. 90 capsule 3   Dulaglutide (TRULICITY) 0.75 MG/0.5ML SOPN INJECT 0.75 MG SUBCUTANEOUSLY ONCE A WEEK 6 mL 5   DULoxetine (CYMBALTA) 30 MG capsule Take 1 capsule (30 mg total) by mouth daily. 90 capsule 1   empagliflozin (JARDIANCE) 10 MG TABS tablet Take 1 tablet (10 mg total) by mouth daily before breakfast. 90 tablet 3   ENTRESTO 24-26 MG TAKE 1 TABLET BY MOUTH TWICE A DAY 60 tablet 11   fluticasone (FLONASE) 50 MCG/ACT nasal spray USE 2 SPRAY(S) IN EACH NOSTRIL ONCE DAILY AS NEEDED 48 g 0   Glucose Blood (BLOOD GLUCOSE TEST STRIPS) STRP Inject 1 each into the skin 2 (two) times daily. May substitute to any manufacturer covered by [name]'s insurance 100 strip 1   HYDROcodone-acetaminophen (NORCO) 5-325 MG tablet Take 1 tablet by mouth every 6 (six) hours as needed for moderate pain (pain score 4-6). Next fill 10/06/23 for chronic pain 120 tablet 0   metFORMIN (GLUCOPHAGE-XR) 750 MG 24 hr tablet Take 1 tablet by mouth once daily with breakfast 90 tablet 0   metoprolol succinate (TOPROL XL) 50 MG 24 hr  tablet Take 1 tablet (50 mg total) by mouth daily. 30 tablet 11   ondansetron (ZOFRAN-ODT) 4 MG disintegrating tablet Take 1 tablet (4 mg total) by mouth every 8 (eight) hours as needed for nausea or vomiting. 20 tablet 0   pantoprazole (PROTONIX) 40 MG tablet Take 1 tablet (40 mg total) by mouth 2 (two) times daily before a meal. 60 tablet 1   Vitamin D, Ergocalciferol, (DRISDOL) 1.25 MG (50000 UNIT) CAPS capsule TAKE 1 CAPSULE BY MOUTH ONE TIME PER WEEK 12 capsule 3   No current facility-administered medications for this visit.     ALLERGIES: Flagyl [metronidazole], Lansoprazole, and Pravastatin  Family History  Problem Relation Age of Onset   Colon cancer Mother        mid-50s, died of metastatic disease   Cancer Mother        liver   Coronary artery disease Mother    Diabetes type I Mother    Peripheral vascular disease Mother        Carotid disease in her 63s   Coronary artery disease Father        CAD in his 56s   Diabetes Father    Diabetes type I Father    Hypertension Father    Bipolar disorder Sister    Coronary artery disease Brother        MI in his 3s   Hypertension Brother    Hypertension Maternal Grandmother    Hyperlipidemia Maternal Grandmother    Stroke Maternal Grandmother    Hypertension Maternal Grandfather    Diabetes Maternal Grandfather    Diabetes Paternal Grandmother    Heart disease Paternal Grandfather     Social History   Socioeconomic History   Marital status: Married    Spouse name: Not on file   Number of children: 3   Years of education: Not on file   Highest education level: Some college, no degree  Occupational History   Occupation: child care    Employer: [employer]  Tobacco Use   Smoking status: Never   Smokeless tobacco: Never  Vaping Use  Vaping status: Never Used  Substance and Sexual Activity   Alcohol use: No    Alcohol/week: 0.0 standard drinks of alcohol   Drug use: No   Sexual activity: Yes    Partners:  Male    Birth control/protection: Post-menopausal  Other Topics Concern   Not on file  Social History Narrative   ** Merged History Encounter **       Social Determinants of Health   Financial Resource Strain: Medium Risk (03/11/2023)   Overall Financial Resource Strain (CARDIA)    Difficulty of Paying Living Expenses: Somewhat hard  Food Insecurity: Food Insecurity Present (03/11/2023)   Hunger Vital Sign    Worried About Running Out of Food in the Last Year: Sometimes true    Ran Out of Food in the Last Year: Sometimes true  Transportation Needs: No Transportation Needs (03/11/2023)   PRAPARE - Administrator, Civil Service (Medical): No    Lack of Transportation (Non-Medical): No  Physical Activity: Unknown (03/11/2023)   Exercise Vital Sign    Days of Exercise per Week: [name] declined    Minutes of Exercise per Session: Not on file  Stress: Stress Concern Present (03/11/2023)   [employer] of Occupational Health - Occupational Stress Questionnaire    Feeling of Stress : To some extent  Social Connections: Moderately Isolated (03/11/2023)   Social Connection and Isolation Panel [NHANES]    Frequency of Communication with Friends and Family: More than three times a week    Frequency of Social Gatherings with Friends and Family: Twice a week    Attends Religious Services: Never    Database administrator or Organizations: No    Attends Engineer, structural: Not on file    Marital Status: Married  Catering manager Violence: Not on file    Review of Systems  Genitourinary:  Positive for vaginal bleeding.  All other systems reviewed and are negative.   PHYSICAL EXAMINATION:   Ht 5\' 3"  (1.6 m)   Wt 229 lb (103.9 kg)   LMP 04/05/2016 Comment: spotting 11/2016 and 12/2016  BMI 40.57 kg/m     General appearance: alert, cooperative and appears stated age   Pelvic: External genitalia:  no lesions              Urethra:  normal appearing urethra with no  masses, tenderness or lesions              Bartholins and Skenes: normal                 Vagina: normal appearing vagina with normal color and discharge, no lesions              Cervix: no lesions.  Small clot in the vagina.  No active bleeding from the os.                 Bimanual Exam:  Uterus:  normal size, contour, position, consistency, mobility, non-tender              Adnexa: no mass, fullness, tenderness        Chaperone was present for exam:  Warren Lacy, CMA  ASSESSMENT:  Abnormal uterine bleeding.  Right breast cancer, ER neg, and PR 2% positive.   PLAN: Quant hCG, FSH, estradiol, CBC, iron, ferritin.  I anticipate she will need to start iron.  Provera 10 mg x 14 days.   I did discuss that this is progesterone and low risk for affecting her  breast health.  Pelvic US.  Return for EMB next week.  Bleeding precautions given.   31 min  total time was spent for this [name] encounter, including preparation, face-to-face counseling with the [name], coordination of care, and documentation of the encounter.

## 2023-10-23 NOTE — Telephone Encounter (Signed)
Report can be called to me on 10/24/23.

## 2023-10-23 NOTE — Telephone Encounter (Signed)
Please schedule pelvic US for my patient who has abnormal uterine bleeding.   She has an appointment next week and I will do an endometrial biopsy then.   Please have the US done before that time and have a STAT reading.

## 2023-10-24 ENCOUNTER — Ambulatory Visit (HOSPITAL_COMMUNITY)
Admission: RE | Admit: 2023-10-24 | Discharge: 2023-10-24 | Disposition: A | Discharge status: Home / Self Care | Payer: Medicaid Other | Source: Ambulatory Visit | Attending: Obstetrics and Gynecology | Admitting: Obstetrics and Gynecology

## 2023-10-24 DIAGNOSIS — N950 Postmenopausal bleeding: Secondary | ICD-10-CM | POA: Diagnosis not present

## 2023-10-24 DIAGNOSIS — N939 Abnormal uterine and vaginal bleeding, unspecified: Secondary | ICD-10-CM | POA: Diagnosis not present

## 2023-10-24 DIAGNOSIS — R9389 Abnormal findings on diagnostic imaging of other specified body structures: Secondary | ICD-10-CM | POA: Diagnosis not present

## 2023-10-24 LAB — CUP PACEART REMOTE DEVICE CHECK
Battery Remaining Longevity: 128 mo
Battery Voltage: 3.03 V
Brady Statistic AP VP Percent: 0.04 %
Brady Statistic AP VS Percent: 0.01 %
Brady Statistic AS VP Percent: 98.25 %
Brady Statistic AS VS Percent: 1.7 %
Brady Statistic RA Percent Paced: 0.06 %
Brady Statistic RV Percent Paced: 2.33 %
Date Time Interrogation Session: 20241203205037
HighPow Impedance: 73 Ohm
Implantable Lead Connection Status: 753985
Implantable Lead Connection Status: 753985
Implantable Lead Connection Status: 753985
Implantable Lead Implant Date: 20240604
Implantable Lead Implant Date: 20240604
Implantable Lead Implant Date: 20240604
Implantable Lead Location: 753858
Implantable Lead Location: 753859
Implantable Lead Location: 753860
Implantable Lead Model: 4598
Implantable Lead Model: 5076
Implantable Pulse Generator Implant Date: 20240604
Lead Channel Impedance Value: 1140 Ohm
Lead Channel Impedance Value: 1235 Ohm
Lead Channel Impedance Value: 1311 Ohm
Lead Channel Impedance Value: 361 Ohm
Lead Channel Impedance Value: 380 Ohm
Lead Channel Impedance Value: 551 Ohm
Lead Channel Impedance Value: 589 Ohm
Lead Channel Impedance Value: 665 Ohm
Lead Channel Impedance Value: 722 Ohm
Lead Channel Impedance Value: 760 Ohm
Lead Channel Impedance Value: 817 Ohm
Lead Channel Impedance Value: 855 Ohm
Lead Channel Impedance Value: 893 Ohm
Lead Channel Pacing Threshold Amplitude: 0.625 V
Lead Channel Pacing Threshold Amplitude: 0.625 V
Lead Channel Pacing Threshold Amplitude: 0.75 V
Lead Channel Pacing Threshold Pulse Width: 0.4 ms
Lead Channel Pacing Threshold Pulse Width: 0.4 ms
Lead Channel Pacing Threshold Pulse Width: 0.4 ms
Lead Channel Sensing Intrinsic Amplitude: 2.1 mV
Lead Channel Sensing Intrinsic Amplitude: 9.9 mV
Lead Channel Setting Pacing Amplitude: 1.25 V
Lead Channel Setting Pacing Amplitude: 1.5 V
Lead Channel Setting Pacing Amplitude: 2 V
Lead Channel Setting Pacing Pulse Width: 0.4 ms
Lead Channel Setting Pacing Pulse Width: 0.4 ms
Lead Channel Setting Sensing Sensitivity: 0.3 mV
Zone Setting Status: 755011
Zone Setting Status: 755011
Zone Setting Status: 755011

## 2023-10-24 LAB — CBC
HCT: 47.1 % — ABNORMAL HIGH (ref 35.0–45.0)
Hemoglobin: 15 g/dL (ref 11.7–15.5)
MCH: 27 pg (ref 27.0–33.0)
MCHC: 31.8 g/dL — ABNORMAL LOW (ref 32.0–36.0)
MCV: 84.9 fL (ref 80.0–100.0)
MPV: 11.5 fL (ref 7.5–12.5)
Platelets: 236 10*3/uL (ref 140–400)
RBC: 5.55 10*6/uL — ABNORMAL HIGH (ref 3.80–5.10)
RDW: 14.9 % (ref 11.0–15.0)
WBC: 10.1 10*3/uL (ref 3.8–10.8)

## 2023-10-24 LAB — FOLLICLE STIMULATING HORMONE: FSH: 20 m[IU]/mL

## 2023-10-24 LAB — FERRITIN: Ferritin: 29 ng/mL (ref 16–232)

## 2023-10-24 LAB — ESTRADIOL: Estradiol: 18 pg/mL

## 2023-10-24 LAB — HCG, QUANTITATIVE, PREGNANCY: HCG, Total, QN: <5 m[IU]/mL

## 2023-10-24 LAB — IRON: Iron: 57 ug/dL (ref 45–160)

## 2023-10-25 NOTE — Telephone Encounter (Signed)
PUS report finalized and ready for review at your earliest convenience.

## 2023-10-28 NOTE — Telephone Encounter (Signed)
Seen by patient [name] on 10/28/2023 11:44 AM   Encounter closed.

## 2023-10-28 NOTE — Telephone Encounter (Signed)
Results sent to patient through my chart along with recommendation to keep appointment this week for endometrial biopsy with me.

## 2023-10-30 ENCOUNTER — Ambulatory Visit: Payer: Medicaid Other | Admitting: Obstetrics and Gynecology

## 2023-10-30 ENCOUNTER — Encounter: Payer: Self-pay | Admitting: Obstetrics and Gynecology

## 2023-10-30 ENCOUNTER — Other Ambulatory Visit (HOSPITAL_COMMUNITY)
Admission: RE | Admit: 2023-10-30 | Discharge: 2023-10-30 | Disposition: A | Discharge status: Home / Self Care | Payer: Medicaid Other | Source: Ambulatory Visit | Attending: Obstetrics and Gynecology | Admitting: Obstetrics and Gynecology

## 2023-10-30 VITALS — BP 130/80 | HR 74 | Ht 63.0 in | Wt 229.0 lb

## 2023-10-30 DIAGNOSIS — N950 Postmenopausal bleeding: Secondary | ICD-10-CM | POA: Diagnosis not present

## 2023-10-30 DIAGNOSIS — N939 Abnormal uterine and vaginal bleeding, unspecified: Secondary | ICD-10-CM | POA: Diagnosis not present

## 2023-10-30 DIAGNOSIS — R9389 Abnormal findings on diagnostic imaging of other specified body structures: Secondary | ICD-10-CM

## 2023-10-30 NOTE — Patient Instructions (Signed)
 Endometrial Biopsy  An endometrial biopsy is a procedure where a tissue sample is removed from the lining of the uterus. This lining is called the endometrium. The tissue sample is then sent to a lab for testing. You may have this type of biopsy to check for: Cancer. Infection. Growths called polyps. Uterine bleeding that can't be explained. Tell a health care provider about: Any allergies you have. All medicines you're taking including vitamins, herbs, eye drops, creams, and over-the-counter medicines. Any problems you or family members have had with anesthesia. Any bleeding problems you have. Any surgeries you have had. Any medical problems you have. Whether you're pregnant or may be pregnant. What are the risks? Your health care provider will talk with you about risks. These may include: Bleeding. Infection. Allergic reactions to medicines. Damage to the wall of the uterus. This is rare. What happens before the procedure? Keep track of your period. You may need to have this biopsy when you're not having your period. Ask your provider about: Changing or stopping your regular medicines. These include any diabetes medicines or blood thinners you take. Taking medicines such as aspirin and ibuprofen. These medicines can thin your blood. Do not take them unless your provider tells you to. Taking over-the-counter medicines, vitamins, herbs, and supplements. Bring a pad with you. You may need to wear one after the biopsy. Plan to have a responsible adult take you home from the hospital or clinic. You won't be allowed to drive. What happens during the procedure? A tool will be put into your vagina to hold it open. This helps your provider see the cervix. The cervix is the lowest part of the uterus. Your cervix will be cleaned with a solution that kills germs. You will be given anesthesia. This keeps you from feeling pain. It will numb your cervix. A tool called forceps will be used to  hold your cervix steady. A thin tool called a uterine sound will be put through your cervix. It will be used to: Find the length of your uterus. Find where to take the sample from. A soft tube called a catheter will be put into your uterus. The catheter will remove a tissue sample. The tube and tools will be removed. The sample will be sent to a lab for testing. The procedure may vary among providers and hospitals. What happens after the procedure? Your blood pressure, heart rate, breathing rate, and blood oxygen level will be monitored until you leave the hospital or clinic. It's up to you to get the results of your procedure. Ask your provider, or the department that is doing the procedure, when your results will be ready. This information is not intended to replace advice given to you by your health care provider. Make sure you discuss any questions you have with your health care provider. Document Revised: 01/15/2023 Document Reviewed: 01/15/2023 Elsevier Patient Education  2024 [employer].

## 2023-10-30 NOTE — Addendum Note (Signed)
Addended by: Ardell Isaacs, Debbe Bales E on: 10/30/2023 03:53 PM   Modules accepted: Orders

## 2023-11-01 ENCOUNTER — Telehealth: Payer: Self-pay | Admitting: *Deleted

## 2023-11-01 DIAGNOSIS — N939 Abnormal uterine and vaginal bleeding, unspecified: Secondary | ICD-10-CM

## 2023-11-01 LAB — SURGICAL PATHOLOGY

## 2023-11-01 NOTE — Telephone Encounter (Signed)
Call returned to patient. Patient anxious, asking for EMB results dated 10/30/23. States she reviewed in MyChart.   Advised A. ENDOMETRIUM, BIOPSY:  Inactive endometrium with focal breakdown.  Negative for atypia or malignancy.   Advised Dr. Edward Jolly will still need to review and make any additional recommendations for f/u. Patient appreciative of call.   Routing to Dr. Edward Jolly for review.

## 2023-11-04 NOTE — Telephone Encounter (Signed)
See result note from 11/04/23.   I recommend a sonohysterogram with me due to her negative endometrial biopsy.

## 2023-11-05 NOTE — Telephone Encounter (Signed)
I recommend increasing Provera to 20 mg daily.    Rx:  Provera 10 mg tablets Sig:  take 2 tablets (20 mg) by mouth once a day.  Disp:  60 RF:  none.   I recommend surgical consultation with Dr. Karma Greaser or Dr. Kennith Center.   Patient may benefit from a surgical approach and not waiting potentially until next year to do a sonohysterogram.   She may be a good candidate for a hysteroscopy with dilation and curettage.

## 2023-11-05 NOTE — Telephone Encounter (Signed)
Pt has seen the result note/recommendations on 11/04/2023.   Spoke w/ pt and she reported that she is agreeable to scheduling the sonohyst. However:  Pt reports bleeding started yesterday and took last pill (provera) today.   Pt reports light to moderate bleeding w/ some noticeable clots.    Pain scale 6-7/10 for cramping but reports that she takes pain meds for back and that has helped w/ relief of cramping.   Any additional recommendations prior to fwding to appt desk to schedule pt?

## 2023-11-06 ENCOUNTER — Telehealth: Payer: Self-pay

## 2023-11-06 MED ORDER — HYDROCODONE-ACETAMINOPHEN 5-325 MG PO TABS: 1.0000 | ORAL_TABLET | Freq: Four times a day (QID) | ORAL | 0 refills | Status: DC | PRN

## 2023-11-06 MED ORDER — MEDROXYPROGESTERONE ACETATE 10 MG PO TABS: ORAL_TABLET | ORAL | 0 refills | Status: DC

## 2023-11-06 NOTE — Telephone Encounter (Signed)
Pt notified and voiced understanding. Agreeable to the surgical consult.  Rx sent and msg sent to appt desk to contact pt.

## 2023-11-06 NOTE — Telephone Encounter (Signed)
[name], CMA Left message for patient to call and schedule appointment.

## 2023-11-06 NOTE — Progress Notes (Deleted)
[name] [address] [place] [address] Phone: [phone]   Assessment and Plan:     There are no diagnoses linked to this encounter.  ***   Pertinent previous records reviewed include ***    Follow Up: ***     Subjective:   I, Antonin Meininger, am serving as a Neurosurgeon for Doctor Richardean Sale   Chief Complaint: left glute pain    HPI:    10/10/2023 Patient is a 57year old female complaining of glute pain. Patient states that she has had pain for about 2-3 months. She was startled and fell on to a heater thinks that may be the cause of the pain. Hx of DDD has been using hydrocodone and that helps with the pain. Does endorse antalgic gait. Does endorse some numbness. She has constant pain    11/07/2023 Patient states  Relevant Historical Information: Hypertension, GERD, DM type II, history of breast cancer, Additional pertinent review of systems negative.   Current Outpatient Medications:    apixaban (ELIQUIS) 5 MG TABS tablet, Take 1 tablet (5 mg total) by mouth 2 (two) times daily., Disp: 60 tablet, Rfl: 0   atorvastatin (LIPITOR) 40 MG tablet, TAKE 1 TABLET BY MOUTH AT BEDTIME, Disp: 90 tablet, Rfl: 0   blood glucose meter kit and supplies, Dispense based on patient and insurance preference. Use up to four times daily as directed. (FOR ICD-10 E10.9, E11.9)., Disp: 1 each, Rfl: 5   Blood Glucose Monitoring Suppl DEVI, 1 each by Does not apply route 2 (two) times daily. May substitute to any manufacturer covered by patient's insurance., Disp: 1 each, Rfl: 0   Blood Pressure KIT, 1 Units by Does not apply route daily., Disp: 1 kit, Rfl: 0   cetirizine (ZYRTEC) 10 MG tablet, Take 1 tablet by mouth once daily, Disp: 60 tablet, Rfl: 0   citalopram (CELEXA) 40 MG tablet, TAKE 1 TABLET BY MOUTH EVERY DAY, Disp: 90 tablet, Rfl: 2   dexlansoprazole (DEXILANT) 60 MG capsule, Take 1 capsule (60 mg total) by mouth daily.,  Disp: 90 capsule, Rfl: 3   Dulaglutide (TRULICITY) 0.75 MG/0.5ML SOPN, INJECT 0.75 MG SUBCUTANEOUSLY ONCE A WEEK, Disp: 6 mL, Rfl: 5   DULoxetine (CYMBALTA) 30 MG capsule, Take 1 capsule (30 mg total) by mouth daily., Disp: 90 capsule, Rfl: 1   empagliflozin (JARDIANCE) 10 MG TABS tablet, Take 1 tablet (10 mg total) by mouth daily before breakfast., Disp: 90 tablet, Rfl: 3   ENTRESTO 24-26 MG, TAKE 1 TABLET BY MOUTH TWICE A DAY, Disp: 60 tablet, Rfl: 11   fluticasone (FLONASE) 50 MCG/ACT nasal spray, USE 2 SPRAY(S) IN EACH NOSTRIL ONCE DAILY AS NEEDED, Disp: 48 g, Rfl: 0   HYDROcodone-acetaminophen (NORCO) 5-325 MG tablet, Take 1 tablet by mouth every 6 (six) hours as needed for moderate pain (pain score 4-6). Next fill 10/06/23 for chronic pain, Disp: 120 tablet, Rfl: 0   medroxyPROGESTERone (PROVERA) 10 MG tablet, Take 1 tablet (10 mg total) by mouth daily. Take for 14 days., Disp: 14 tablet, Rfl: 0   metFORMIN (GLUCOPHAGE-XR) 750 MG 24 hr tablet, Take 1 tablet by mouth once daily with breakfast, Disp: 90 tablet, Rfl: 0   metoprolol succinate (TOPROL XL) 50 MG 24 hr tablet, Take 1 tablet (50 mg total) by mouth daily., Disp: 30 tablet, Rfl: 11   ondansetron (ZOFRAN-ODT) 4 MG disintegrating tablet, Take 1 tablet (4 mg total) by mouth every 8 (eight) hours as  needed for nausea or vomiting., Disp: 20 tablet, Rfl: 0   pantoprazole (PROTONIX) 40 MG tablet, Take 1 tablet (40 mg total) by mouth 2 (two) times daily before a meal., Disp: 60 tablet, Rfl: 1   Vitamin D, Ergocalciferol, (DRISDOL) 1.25 MG (50000 UNIT) CAPS capsule, TAKE 1 CAPSULE BY MOUTH ONE TIME PER WEEK, Disp: 12 capsule, Rfl: 3   Objective:     There were no vitals filed for this visit.    There is no height or weight on file to calculate BMI.    Physical Exam:    ***   Electronically signed by:  [name] 7:48 AM 11/06/23

## 2023-11-06 NOTE — Telephone Encounter (Signed)
Patient requesting refill on hydrocodone per pmp last fill  Filled  Written  ID  Drug  QTY  Days  Prescriber  RX #  Dispenser  Refill  Daily Dose*  Pymt Type  PMP  10/07/2023 09/06/2023 2  Hydrocodone-Acetamin 5-325 Mg 120.00 30 Me Lov [id number] [facility] ([id number]) 0/0 20.00 MME Medicaid [place]

## 2023-11-07 ENCOUNTER — Ambulatory Visit: Payer: Medicaid Other | Admitting: Sports Medicine

## 2023-11-07 ENCOUNTER — Encounter: Payer: Medicaid Other | Admitting: Registered Nurse

## 2023-11-08 NOTE — Telephone Encounter (Signed)
Per review of EPIC, patient scheduled with Dr. Kennith Center on 11/19/23.   Routing FYI.   Encounter closed.

## 2023-11-11 NOTE — Progress Notes (Deleted)
[name] [address] [place] [address] Phone: [phone]   Assessment and Plan:     There are no diagnoses linked to this encounter.  ***   Pertinent previous records reviewed include ***    Follow Up: ***     Subjective:   I, [name], am serving as a Neurosurgeon for [name]   Chief Complaint: left glute pain    HPI:    10/10/2023 Patient is a 57year old female complaining of glute pain. Patient states that she has had pain for about 2-3 months. She was startled and fell on to a heater thinks that may be the cause of the pain. Hx of DDD has been using hydrocodone and that helps with the pain. Does endorse antalgic gait. Does endorse some numbness. She has constant pain    11/21/2023 Patient states  Relevant Historical Information: Hypertension, GERD, DM type II, history of breast cancer, Additional pertinent review of systems negative.   Current Outpatient Medications:    apixaban (ELIQUIS) 5 MG TABS tablet, Take 1 tablet (5 mg total) by mouth 2 (two) times daily., Disp: 60 tablet, Rfl: 0   atorvastatin (LIPITOR) 40 MG tablet, TAKE 1 TABLET BY MOUTH AT BEDTIME, Disp: 90 tablet, Rfl: 0   blood glucose meter kit and supplies, Dispense based on patient and insurance preference. Use up to four times daily as directed. (FOR ICD-10 E10.9, E11.9)., Disp: 1 each, Rfl: 5   Blood Glucose Monitoring Suppl DEVI, 1 each by Does not apply route 2 (two) times daily. May substitute to any manufacturer covered by patient's insurance., Disp: 1 each, Rfl: 0   Blood Pressure KIT, 1 Units by Does not apply route daily., Disp: 1 kit, Rfl: 0   cetirizine (ZYRTEC) 10 MG tablet, Take 1 tablet by mouth once daily, Disp: 60 tablet, Rfl: 0   citalopram (CELEXA) 40 MG tablet, TAKE 1 TABLET BY MOUTH EVERY DAY, Disp: 90 tablet, Rfl: 2   dexlansoprazole (DEXILANT) 60 MG capsule, Take 1 capsule (60 mg total) by mouth daily.,  Disp: 90 capsule, Rfl: 3   Dulaglutide (TRULICITY) 0.75 MG/0.5ML SOPN, INJECT 0.75 MG SUBCUTANEOUSLY ONCE A WEEK, Disp: 6 mL, Rfl: 5   DULoxetine (CYMBALTA) 30 MG capsule, Take 1 capsule (30 mg total) by mouth daily., Disp: 90 capsule, Rfl: 1   empagliflozin (JARDIANCE) 10 MG TABS tablet, Take 1 tablet (10 mg total) by mouth daily before breakfast., Disp: 90 tablet, Rfl: 3   ENTRESTO 24-26 MG, TAKE 1 TABLET BY MOUTH TWICE A DAY, Disp: 60 tablet, Rfl: 11   fluticasone (FLONASE) 50 MCG/ACT nasal spray, USE 2 SPRAY(S) IN EACH NOSTRIL ONCE DAILY AS NEEDED, Disp: 48 g, Rfl: 0   HYDROcodone-acetaminophen (NORCO) 5-325 MG tablet, Take 1 tablet by mouth every 6 (six) hours as needed for moderate pain (pain score 4-6). Next fill 10/06/23 for chronic pain, Disp: 120 tablet, Rfl: 0   medroxyPROGESTERone (PROVERA) 10 MG tablet, Take two tabs po (20mg ) once daily., Disp: 60 tablet, Rfl: 0   metFORMIN (GLUCOPHAGE-XR) 750 MG 24 hr tablet, Take 1 tablet by mouth once daily with breakfast, Disp: 90 tablet, Rfl: 0   metoprolol succinate (TOPROL XL) 50 MG 24 hr tablet, Take 1 tablet (50 mg total) by mouth daily., Disp: 30 tablet, Rfl: 11   ondansetron (ZOFRAN-ODT) 4 MG disintegrating tablet, Take 1 tablet (4 mg total) by mouth every 8 (eight) hours as needed for nausea or vomiting., Disp:  20 tablet, Rfl: 0   pantoprazole (PROTONIX) 40 MG tablet, Take 1 tablet (40 mg total) by mouth 2 (two) times daily before a meal., Disp: 60 tablet, Rfl: 1   Vitamin D, Ergocalciferol, (DRISDOL) 1.25 MG (50000 UNIT) CAPS capsule, TAKE 1 CAPSULE BY MOUTH ONE TIME PER WEEK, Disp: 12 capsule, Rfl: 3   Objective:     There were no vitals filed for this visit.    There is no height or weight on file to calculate BMI.    Physical Exam:    ***   Electronically signed by:  [name] [name] 8:52 AM 11/11/23

## 2023-11-14 ENCOUNTER — Encounter: Payer: Self-pay | Admitting: Registered Nurse

## 2023-11-14 ENCOUNTER — Encounter: Payer: Medicaid Other | Attending: Registered Nurse | Admitting: Registered Nurse

## 2023-11-14 ENCOUNTER — Ambulatory Visit (HOSPITAL_COMMUNITY): Payer: Medicaid Other | Attending: Student

## 2023-11-14 VITALS — BP 115/78 | HR 82 | Ht 63.0 in | Wt 235.8 lb

## 2023-11-14 DIAGNOSIS — Z79891 Long term (current) use of opiate analgesic: Secondary | ICD-10-CM | POA: Insufficient documentation

## 2023-11-14 DIAGNOSIS — M5416 Radiculopathy, lumbar region: Secondary | ICD-10-CM | POA: Diagnosis not present

## 2023-11-14 DIAGNOSIS — G894 Chronic pain syndrome: Secondary | ICD-10-CM | POA: Diagnosis not present

## 2023-11-14 DIAGNOSIS — I5022 Chronic systolic (congestive) heart failure: Secondary | ICD-10-CM | POA: Insufficient documentation

## 2023-11-14 DIAGNOSIS — Z5181 Encounter for therapeutic drug level monitoring: Secondary | ICD-10-CM | POA: Insufficient documentation

## 2023-11-14 LAB — ECHOCARDIOGRAM COMPLETE
Area-P 1/2: 4.29 cm2
Height: 63 in
S' Lateral: 2.1 cm
Weight: 3772.8 [oz_av]

## 2023-11-14 MED ORDER — HYDROCODONE-ACETAMINOPHEN 5-325 MG PO TABS: 1.0000 | ORAL_TABLET | Freq: Four times a day (QID) | ORAL | 0 refills | Status: DC | PRN

## 2023-11-14 NOTE — Progress Notes (Signed)
Subjective:    Patient ID: [name], female    DOB: [birth year], 57 y.o.   MRN: [id number]  HPI: [name] is a 57 y.o. female who returns for follow up appointment for chronic pain and medication refill. She states her pain is located in her lower back radiating into her left hip and left lower extremity. She rates her pain 7. Her current exercise regime is walking and performing stretching exercises.  Ms. Stumph Morphine equivalent is 20.00 MME.   Last UDS was 09/06/2023, consistent . PMP was reviewed last fill date was 08/08/2023, repeat UDS today.    Pain Inventory Average Pain 8 Pain Right Now 7 My pain is constant and sharp  In the last 24 hours, has pain interfered with the following? General activity 9 Relation with others 5 Enjoyment of life 9 What TIME of day is your pain at its worst? morning , daytime, evening, and night Sleep (in general) Poor  Pain is worse with: walking, bending, sitting, inactivity, standing, and some activites Pain improves with: medication Relief from Meds: 1  Family History  Problem Relation Age of Onset   Colon cancer Mother        mid-50s, died of metastatic disease   Cancer Mother        liver   Coronary artery disease Mother    Diabetes type I Mother    Peripheral vascular disease Mother        Carotid disease in her 58s   Coronary artery disease Father        CAD in his 70s   Diabetes Father    Diabetes type I Father    Hypertension Father    Bipolar disorder Sister    Coronary artery disease Brother        MI in his 27s   Hypertension Brother    Hypertension Maternal Grandmother    Hyperlipidemia Maternal Grandmother    Stroke Maternal Grandmother    Hypertension Maternal Grandfather    Diabetes Maternal Grandfather    Diabetes Paternal Grandmother    Heart disease Paternal Grandfather    Social History   Socioeconomic History   Marital status: Married    Spouse name: Not on file   Number of children: 3    Years of education: Not on file   Highest education level: Some college, no degree  Occupational History   Occupation: child care    Employer: [employer]  Tobacco Use   Smoking status: Never   Smokeless tobacco: Never  Vaping Use   Vaping status: Never Used  Substance and Sexual Activity   Alcohol use: No    Alcohol/week: 0.0 standard drinks of alcohol   Drug use: No   Sexual activity: Yes    Partners: Male    Birth control/protection: Post-menopausal  Other Topics Concern   Not on file  Social History Narrative   ** Merged History Encounter **       Social Drivers of Health   Financial Resource Strain: Medium Risk (03/11/2023)   Overall Financial Resource Strain (CARDIA)    Difficulty of Paying Living Expenses: Somewhat hard  Food Insecurity: Food Insecurity Present (03/11/2023)   Hunger Vital Sign    Worried About Running Out of Food in the Last Year: Sometimes true    Ran Out of Food in the Last Year: Sometimes true  Transportation Needs: No Transportation Needs (03/11/2023)   PRAPARE - Administrator, Civil Service (Medical): No  Lack of Transportation (Non-Medical): No  Physical Activity: Unknown (03/11/2023)   Exercise Vital Sign    Days of Exercise per Week: Patient declined    Minutes of Exercise per Session: Not on file  Stress: Stress Concern Present (03/11/2023)   [employer] of Occupational Health - Occupational Stress Questionnaire    Feeling of Stress : To some extent  Social Connections: Moderately Isolated (03/11/2023)   Social Connection and Isolation Panel [NHANES]    Frequency of Communication with Friends and Family: More than three times a week    Frequency of Social Gatherings with Friends and Family: Twice a week    Attends Religious Services: Never    Database administrator or Organizations: No    Attends Engineer, structural: Not on file    Marital Status: Married   Past Surgical History:  Procedure  Laterality Date   back surg x2     BALLOON DILATION N/A 09/22/2021   Procedure: BALLOON DILATION;  Surgeon: Lanelle Bal, DO;  [facility];  Service: Endoscopy;  Laterality: N/A;   BIOPSY  09/22/2021   Procedure: BIOPSY;  Surgeon: Lanelle Bal, DO;  [facility];  Service: Endoscopy;;   BIV ICD INSERTION CRT-D N/A 04/23/2023   Procedure: BIV ICD INSERTION CRT-D;  Surgeon: Maurice Small, MD;  Location: [facility];  Service: Cardiovascular;  Laterality: N/A;   BREAST LUMPECTOMY Right 2012   CHOLECYSTECTOMY     COLONOSCOPY  11/03/07   4-mm sessile polyp removed/small internal hemorrhoids/tubular adenoma, random colon bx negative for microscopic colitis   COLONOSCOPY WITH PROPOFOL N/A 09/17/2016   Grade 2 hemorrhoids, colonic diverticulosis, ascending colon polyp (sessile serrated adenoma), 5 year surveillance   COLONOSCOPY WITH PROPOFOL N/A 09/22/2021   Procedure: COLONOSCOPY WITH PROPOFOL;  Surgeon: Lanelle Bal, DO;  [facility]: [facility];  Service: Endoscopy;  Laterality: N/A;  7:30am   COLONOSCOPY, ESOPHAGOGASTRODUODENOSCOPY (EGD) AND ESOPHAGEAL DILATION  01/2012   mild gastritis s/p biopsy. Empiric dilation. Normal duodenum.    DILATATION & CURETTAGE/HYSTEROSCOPY WITH MYOSURE N/A 02/10/2015   Procedure: DILATATION & CURETTAGE/HYSTEROSCOPY WITH MYOSURE;  Surgeon: Patton Salles, MD;  Location: [facility];  Service: Gynecology;  Laterality: N/A;   ESOPHAGOGASTRODUODENOSCOPY (EGD) WITH PROPOFOL N/A 01/11/2020   Normal esophagus, s/p dilation, normal stomach, normal duodenum.    ESOPHAGOGASTRODUODENOSCOPY (EGD) WITH PROPOFOL N/A 09/22/2021   Procedure: ESOPHAGOGASTRODUODENOSCOPY (EGD) WITH PROPOFOL;  Surgeon: Lanelle Bal, DO;  [facility];  Service: Endoscopy;  Laterality: N/A;   MALONEY DILATION N/A 01/11/2020   Procedure: Elease Hashimoto DILATION;  Surgeon: Corbin Ade, MD;  [facility];  Service: Endoscopy;   Laterality: N/A;   MM BREAST STEREO BX*L*R/S     rt.   POLYPECTOMY  09/17/2016   Procedure: POLYPECTOMY;  Surgeon: Corbin Ade, MD;  [facility];  Service: Endoscopy;;  ascending colon   POLYPECTOMY  09/22/2021   Procedure: POLYPECTOMY;  Surgeon: Lanelle Bal, DO;  [facility];  Service: Endoscopy;;   PORT-A-CATH REMOVAL  05/26/2012   Procedure: REMOVAL PORT-A-CATH;  Surgeon: [name], MD;  Location: [facility];  Service: General;  Laterality: N/A;  Minor Room   PORTACATH PLACEMENT     Past Surgical History:  Procedure Laterality Date   back surg x2     BALLOON DILATION N/A 09/22/2021   Procedure: BALLOON DILATION;  Surgeon: Lanelle Bal, DO;  [facility];  Service: Endoscopy;  Laterality: N/A;   BIOPSY  09/22/2021   Procedure: BIOPSY;  Surgeon: Lanelle Bal, DO;  [facility];  Service: Endoscopy;;   BIV ICD INSERTION CRT-D N/A 04/23/2023   Procedure: BIV ICD INSERTION CRT-D;  Surgeon: Maurice Small, MD;  Location: [facility] INVASIVE CV LAB;  Service: Cardiovascular;  Laterality: N/A;   BREAST LUMPECTOMY Right 2012   CHOLECYSTECTOMY     COLONOSCOPY  11/03/07   4-mm sessile polyp removed/small internal hemorrhoids/tubular adenoma, random colon bx negative for microscopic colitis   COLONOSCOPY WITH PROPOFOL N/A 09/17/2016   Grade 2 hemorrhoids, colonic diverticulosis, ascending colon polyp (sessile serrated adenoma), 5 year surveillance   COLONOSCOPY WITH PROPOFOL N/A 09/22/2021   Procedure: COLONOSCOPY WITH PROPOFOL;  Surgeon: Lanelle Bal, DO;  [facility];  Service: Endoscopy;  Laterality: N/A;  7:30am   COLONOSCOPY, ESOPHAGOGASTRODUODENOSCOPY (EGD) AND ESOPHAGEAL DILATION  01/2012   mild gastritis s/p biopsy. Empiric dilation. Normal duodenum.    DILATATION & CURETTAGE/HYSTEROSCOPY WITH MYOSURE N/A 02/10/2015   Procedure: DILATATION & CURETTAGE/HYSTEROSCOPY WITH MYOSURE;  Surgeon: Patton Salles, MD;   Location: [facility];  Service: Gynecology;  Laterality: N/A;   ESOPHAGOGASTRODUODENOSCOPY (EGD) WITH PROPOFOL N/A 01/11/2020   Normal esophagus, s/p dilation, normal stomach, normal duodenum.    ESOPHAGOGASTRODUODENOSCOPY (EGD) WITH PROPOFOL N/A 09/22/2021   Procedure: ESOPHAGOGASTRODUODENOSCOPY (EGD) WITH PROPOFOL;  Surgeon: Lanelle Bal, DO;  [facility];  Service: Endoscopy;  Laterality: N/A;   MALONEY DILATION N/A 01/11/2020   Procedure: Elease Hashimoto DILATION;  Surgeon: Corbin Ade, MD;  [facility];  Service: Endoscopy;  Laterality: N/A;   MM BREAST STEREO BX*L*R/S     rt.   POLYPECTOMY  09/17/2016   Procedure: POLYPECTOMY;  Surgeon: Corbin Ade, MD;  [facility];  Service: Endoscopy;;  ascending colon   POLYPECTOMY  09/22/2021   Procedure: POLYPECTOMY;  Surgeon: Lanelle Bal, DO;  [facility];  Service: Endoscopy;;   PORT-A-CATH REMOVAL  05/26/2012   Procedure: REMOVAL PORT-A-CATH;  Surgeon: [name], MD;  Location: [facility];  Service: General;  Laterality: N/A;  Minor Room   PORTACATH PLACEMENT     Past Medical History:  Diagnosis Date   Anxiety    Arthritis    Blood transfusion without reported diagnosis 1999   due to heavy menses   Breast cancer (HCC) 05/22/2011   03/01/11, Stage 2, s/p lumpectomy, chemo/xrt   CHF (congestive heart failure) (HCC)    Complication of anesthesia    pt woke up during procedure   DDD (degenerative disc disease), lumbar    Depression 06/22/2011   Diverticulitis    DM (diabetes mellitus) (HCC) 06/22/2011   Genital warts    GERD (gastroesophageal reflux disease) 06/22/2011   Hx of adenomatous colonic polyps 10/2007   4mm sigmoid tubular adenoma, FH colon cancer, mother in mid-50s   Hypercholesterolemia 06/22/2011   Hypertension 06/22/2011   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   Iron deficiency anemia 03/25/2015   LBBB (left bundle branch block)    Mild CAD     Morbid obesity (HCC)    NICM (nonischemic cardiomyopathy) (HCC)    Personal history of chemotherapy    Personal history of radiation therapy    PSVT (paroxysmal supraventricular tachycardia) (HCC)    Shortness of breath    STD (sexually transmitted disease)    HPV, 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   Syncope  Syncope    Vaginal bleeding 03/08/2014   LMP 04/05/2016 Comment: spotting 11/2016 and 12/2016  Opioid Risk Score:   Fall Risk Score:  `1  Depression screen PHQ 2/9     09/06/2023    1:01 PM 07/18/2023    9:01 AM 03/15/2023   12:47 PM 03/14/2023    3:16 PM 12/17/2022   12:54 PM 10/20/2021    3:10 PM 07/14/2021    3:19 PM  Depression screen PHQ 2/9  Decreased Interest 1 1 1 1  0 3 1  Down, Depressed, Hopeless 1 1 1 1  0 3 1  PHQ - 2 Score 2 2 2 2  0 6 2  Altered sleeping  1  2     Tired, decreased energy  2  2     Change in appetite  0  0     Feeling bad or failure about yourself   1  0     Trouble concentrating  1  2     Moving slowly or fidgety/restless  0  0     Suicidal thoughts  0  0     PHQ-9 Score  7  8     Difficult doing work/chores    Somewhat difficult         Review of Systems  Musculoskeletal:  Positive for back pain.  All other systems reviewed and are negative.     Objective:   Physical Exam Vitals and nursing note reviewed.  Constitutional:      Appearance: Normal appearance.  Cardiovascular:     Rate and Rhythm: Normal rate and regular rhythm.     Pulses: Normal pulses.     Heart sounds: Normal heart sounds.  Pulmonary:     Effort: Pulmonary effort is normal.     Breath sounds: Normal breath sounds.  Musculoskeletal:     Comments: Normal Muscle Bulk and Muscle Testing Reveals:  Upper Extremities: Full ROM and Muscle Strength 5/5  Lumbar Paraspinal Tenderness: L-3-L-5 Left Greater Trochanter Tenderness  Lower Extremities: Full ROM and Muscle Strength 5/5 Arises from Table slowly Antalgic  Gait     Skin:    General: Skin is warm and dry.   Neurological:     Mental Status: She is alert and oriented to person, place, and time.  Psychiatric:        Mood and Affect: Mood normal.        Behavior: Behavior normal.         Assessment & Plan:  Lumbar Radiculitis: Continue HEP as Tolerated. Continue current Medication Regimen. Continue to Monitor.11/14/2023 Chronic Bilateral Knee Pain: L>R: No complaints today. Ortho Following Continue to Monitor. 11/14/2023 Chronic Pain Syndrome: Refilled: Hydrocodone 5/325 mg  one tablet every 6 hours as needed for pain #120. Seecond script sent to accommodate scheduled appointment. We will continue the opioid monitoring program, this consists of regular clinic visits, examinations, urine drug screen, pill counts as well as use of [place] Controlled Substance Reporting system. A 12 month History has been reviewed on the [place] Controlled Substance Reporting System on 11/14/2023 F/U with Dr. Berline Chough in 2 months

## 2023-11-16 LAB — TOXASSURE SELECT,+ANTIDEPR,UR

## 2023-11-19 ENCOUNTER — Ambulatory Visit: Payer: Medicaid Other | Admitting: Obstetrics and Gynecology

## 2023-11-19 ENCOUNTER — Encounter: Payer: Self-pay | Admitting: Obstetrics and Gynecology

## 2023-11-19 ENCOUNTER — Other Ambulatory Visit (HOSPITAL_COMMUNITY)
Admission: RE | Admit: 2023-11-19 | Discharge: 2023-11-19 | Disposition: A | Discharge status: Home / Self Care | Payer: Medicaid Other | Source: Ambulatory Visit | Attending: Obstetrics and Gynecology | Admitting: Obstetrics and Gynecology

## 2023-11-19 VITALS — BP 122/64 | HR 84 | Wt 233.0 lb

## 2023-11-19 DIAGNOSIS — Z124 Encounter for screening for malignant neoplasm of cervix: Secondary | ICD-10-CM

## 2023-11-19 DIAGNOSIS — N950 Postmenopausal bleeding: Secondary | ICD-10-CM

## 2023-11-19 NOTE — Progress Notes (Signed)
 57 y.o. [phone] female with postmenopausal bleeding, nonischemic cardiomyopathy, left bundle branch block (pacemaker placed summer 2024), coronary artery disease, hypertension, type 2 diabetes, GERD, chronic back and leg pain, obesity here for surgical consultation for hysteroscopy D&C in the setting of ongoing postmenopausal bleeding. Married.   Patient's last menstrual period was 04/05/2016.   New onset bleeding started around the beginning of December.  No other bleeding issues since menopause. Patient was initially evaluated by Dr. Nikki and the following workup was completed: 10/24/2023: Transvaginal ultrasound with thickened endometrium 6 mm, 8 cm uterus with no fibroids or other masses.  Normal bilateral ovaries. 10/30/2023: Benign EMB She was started on Provera  and the bleeding initially improved however she called GYN on 11/01/2023 with heavier bleeding. Provera  was increased to 20 mg daily on 11/05/2023.  Today she reports no additional bleeding since the increase in dosing.  She notes intermittent cramping. Taking norco for back and leg, also using for moderate pelvic cramping.  She cannot take NSAIDs due to GERD.  Last A1c 6.0 Fasting sugars: 101  No chest pain, notes occasional shortness of breath.  She has been followed by cardiology.  GYN HISTORY: Hx of LEEP 2005-2006, and laser ablation prior to LEEP D&C for AUB-F History of right breast cancer, diagnosed in 2012 status postlumpectomy  OB History  Gravida Para Term Preterm AB Living  4 3 0 0 1 3  SAB IAB Ectopic Multiple Live Births  0 0 0  3    # Outcome Date GA Lbr Len/2nd Weight Sex Type Anes PTL Lv  4 AB           3 Para         LIV  2 Para         LIV  1 Para         LIV    Past Medical History:  Diagnosis Date   Anxiety    Arthritis    Blood transfusion without reported diagnosis 1999   due to heavy menses   Breast cancer (HCC) 05/22/2011   03/01/11, Stage 2, s/p lumpectomy, chemo/xrt   CHF  (congestive heart failure) (HCC)    Complication of anesthesia    pt woke up during procedure   DDD (degenerative disc disease), lumbar    Depression 06/22/2011   Diverticulitis    DM (diabetes mellitus) (HCC) 06/22/2011   Genital warts    GERD (gastroesophageal reflux disease) 06/22/2011   Hx of adenomatous colonic polyps 10/2007   4mm sigmoid tubular adenoma, FH colon cancer, mother in mid-50s   Hypercholesterolemia 06/22/2011   Hypertension 06/22/2011   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   Iron deficiency anemia 03/25/2015   LBBB (left bundle branch block)    Mild CAD    Morbid obesity (HCC)    NICM (nonischemic cardiomyopathy) (HCC)    Personal history of chemotherapy    Personal history of radiation therapy    PSVT (paroxysmal supraventricular tachycardia) (HCC)    Shortness of breath    STD (sexually transmitted disease)    HPV, 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   Syncope    Syncope    Vaginal bleeding 03/08/2014    Past Surgical History:  Procedure Laterality Date   back surg x2     BALLOON DILATION N/A 09/22/2021   Procedure: BALLOON DILATION;  Surgeon: Cindie Carlin POUR, DO;  [facility]: [facility];  Service: Endoscopy;  Laterality: N/A;   BIOPSY  09/22/2021   Procedure:  BIOPSY;  Surgeon: Cindie Carlin POUR, DO;  [facility]: [facility];  Service: Endoscopy;;   BIV ICD INSERTION CRT-D N/A 04/23/2023   Procedure: BIV ICD INSERTION CRT-D;  Surgeon: Nancey Eulas BRAVO, MD;  Location: [facility];  Service: Cardiovascular;  Laterality: N/A;   BREAST LUMPECTOMY Right 2012   CHOLECYSTECTOMY     COLONOSCOPY  11/03/07   4-mm sessile polyp removed/small internal hemorrhoids/tubular adenoma, random colon bx negative for microscopic colitis   COLONOSCOPY WITH PROPOFOL  N/A 09/17/2016   Grade 2 hemorrhoids, colonic diverticulosis, ascending colon polyp (sessile serrated adenoma), 5 year surveillance   COLONOSCOPY WITH PROPOFOL  N/A 09/22/2021   Procedure:  COLONOSCOPY WITH PROPOFOL ;  Surgeon: Cindie Carlin POUR, DO;  [facility]: [facility];  Service: Endoscopy;  Laterality: N/A;  7:30am   COLONOSCOPY, ESOPHAGOGASTRODUODENOSCOPY (EGD) AND ESOPHAGEAL DILATION  01/2012   mild gastritis s/p biopsy. Empiric dilation. Normal duodenum.    DILATATION & CURETTAGE/HYSTEROSCOPY WITH MYOSURE N/A 02/10/2015   Procedure: DILATATION & CURETTAGE/HYSTEROSCOPY WITH MYOSURE;  Surgeon: Bobie BRAVO Cathlyn JAYSON Nikki, MD;  Location: [facility];  Service: Gynecology;  Laterality: N/A;   ESOPHAGOGASTRODUODENOSCOPY (EGD) WITH PROPOFOL  N/A 01/11/2020   Normal esophagus, s/p dilation, normal stomach, normal duodenum.    ESOPHAGOGASTRODUODENOSCOPY (EGD) WITH PROPOFOL  N/A 09/22/2021   Procedure: ESOPHAGOGASTRODUODENOSCOPY (EGD) WITH PROPOFOL ;  Surgeon: Cindie Carlin POUR, DO;  [facility]: [facility];  Service: Endoscopy;  Laterality: N/A;   MALONEY DILATION N/A 01/11/2020   Procedure: AGAPITO DILATION;  Surgeon: Shaaron Lamar HERO, MD;  [facility]: [facility];  Service: Endoscopy;  Laterality: N/A;   MM BREAST STEREO BX*L*R/S     rt.   POLYPECTOMY  09/17/2016   Procedure: POLYPECTOMY;  Surgeon: Lamar HERO Shaaron, MD;  [facility]: [facility];  Service: Endoscopy;;  ascending colon   POLYPECTOMY  09/22/2021   Procedure: POLYPECTOMY;  Surgeon: Cindie Carlin POUR, DO;  [facility]: [facility];  Service: Endoscopy;;   PORT-A-CATH REMOVAL  05/26/2012   Procedure: REMOVAL PORT-A-CATH;  Surgeon: Oneil DELENA Budge, MD;  Location: [facility];  Service: General;  Laterality: N/A;  Minor Room   PORTACATH PLACEMENT      Current Outpatient Medications on File Prior to Visit  Medication Sig Dispense Refill   atorvastatin  (LIPITOR) 40 MG tablet TAKE 1 TABLET BY MOUTH AT BEDTIME 90 tablet 0   blood glucose meter kit and supplies Dispense based on patient and insurance preference. Use up to four times daily as directed. (FOR ICD-10 E10.9, E11.9). 1 each 5   Blood Glucose Monitoring Suppl DEVI 1 each by Does not  apply route 2 (two) times daily. May substitute to any manufacturer covered by patient's insurance. 1 each 0   Blood Pressure KIT 1 Units by Does not apply route daily. 1 kit 0   cetirizine  (ZYRTEC ) 10 MG tablet Take 1 tablet by mouth once daily 60 tablet 0   citalopram  (CELEXA ) 40 MG tablet TAKE 1 TABLET BY MOUTH EVERY DAY 90 tablet 2   dexlansoprazole  (DEXILANT ) 60 MG capsule Take 1 capsule (60 mg total) by mouth daily. 90 capsule 3   Dulaglutide  (TRULICITY ) 0.75 MG/0.5ML SOPN INJECT 0.75 MG SUBCUTANEOUSLY ONCE A WEEK 6 mL 5   DULoxetine  (CYMBALTA ) 30 MG capsule Take 1 capsule (30 mg total) by mouth daily. 90 capsule 1   ENTRESTO  24-26 MG TAKE 1 TABLET BY MOUTH TWICE A DAY 60 tablet 11   fluticasone  (FLONASE ) 50 MCG/ACT nasal spray USE 2 SPRAY(S) IN EACH NOSTRIL ONCE DAILY AS NEEDED  48 g 0   HYDROcodone -acetaminophen  (NORCO) 5-325 MG tablet Take 1 tablet by mouth every 6 (six) hours as needed for moderate pain (pain score 4-6). Do Not Fill Before  02/ 14/2025 120 tablet 0   medroxyPROGESTERone  (PROVERA ) 10 MG tablet Take two tabs po (20mg ) once daily. 60 tablet 0   metFORMIN  (GLUCOPHAGE -XR) 750 MG 24 hr tablet Take 1 tablet by mouth once daily with breakfast 90 tablet 0   ondansetron  (ZOFRAN -ODT) 4 MG disintegrating tablet Take 1 tablet (4 mg total) by mouth every 8 (eight) hours as needed for nausea or vomiting. 20 tablet 0   Vitamin D , Ergocalciferol , (DRISDOL ) 1.25 MG (50000 UNIT) CAPS capsule TAKE 1 CAPSULE BY MOUTH ONE TIME PER WEEK 12 capsule 3   No current facility-administered medications on file prior to visit.    Allergies  Allergen Reactions   Flagyl  [Metronidazole ] Hives, Itching and Swelling   Lansoprazole Other (See Comments)    Stomach pain   Pravastatin  Other (See Comments)    Muscle aches      PE Today's Vitals   11/19/23 0739  BP: 122/64  Pulse: 84  SpO2: 97%  Weight: 233 lb (105.7 kg)   Body mass index is 41.27 kg/m.  Physical Exam Vitals reviewed. Exam  conducted with a chaperone present.  Constitutional:      General: She is not in acute distress.    Appearance: Normal appearance.  HENT:     Head: Normocephalic and atraumatic.     Nose: Nose normal.  Eyes:     Extraocular Movements: Extraocular movements intact.     Conjunctiva/sclera: Conjunctivae normal.  Cardiovascular:     Rate and Rhythm: Normal rate and regular rhythm.     Heart sounds: No murmur heard.    No friction rub. No gallop.  Pulmonary:     Effort: Pulmonary effort is normal. No respiratory distress.     Breath sounds: Normal breath sounds. No stridor. No wheezing, rhonchi or rales.  Genitourinary:    General: Normal vulva.     Exam position: Lithotomy position.     Vagina: Normal.     Cervix: Normal. No friability.     Comments: PAP collected Musculoskeletal:        General: Normal range of motion.     Cervical back: Normal range of motion.  Lymphadenopathy:     Lower Body: No right inguinal adenopathy. No left inguinal adenopathy.  Neurological:     General: No focal deficit present.     Mental Status: She is alert.  Psychiatric:        Mood and Affect: Mood normal.        Behavior: Behavior normal.       Assessment and Plan:        Postmenopausal bleeding Assessment & Plan: Reviewed causes of PMB, including genitourinary syndrome of menopause, infection, trauma, polyps, urinary and GI etiologies, and malignancy.   Plan for hysteroscopy, dilation and curettage given continued bleeding despite treatment with Provera .  Discussed outpatient procedure. Reviewed that  recovery is usually 1-2 days. Risks including infections, bleeding, and damage to surrounding organs reviewed. Recommend NPO prior to midnight and reviewed medication to take on day of surgery. Dicussed use of NSAIDS as needed for pain postoperatively.  Preop checklist: Antibiotics: none DVT ppx: SCDs Postop visit: 1 week Additional clearance: medical and cardiac    Orders: -      Cytology - PAP -     Ambulatory Referral For Surgery Scheduling  Cervical  cancer screening -     Cytology - PAP     Dayveon Halley LULLA Pa, MD

## 2023-11-20 DIAGNOSIS — Z419 Encounter for procedure for purposes other than remedying health state, unspecified: Secondary | ICD-10-CM | POA: Diagnosis not present

## 2023-11-21 ENCOUNTER — Ambulatory Visit: Payer: Medicaid Other | Admitting: Sports Medicine

## 2023-11-23 ENCOUNTER — Other Ambulatory Visit: Payer: Self-pay | Admitting: Family Medicine

## 2023-11-23 ENCOUNTER — Other Ambulatory Visit: Payer: Self-pay | Admitting: Cardiology

## 2023-11-25 NOTE — Progress Notes (Deleted)
 [name] [address] [place] [address] Phone: [phone]   Assessment and Plan:     There are no diagnoses linked to this encounter.  ***   Pertinent previous records reviewed include ***    Follow Up: ***     Subjective:   I, [name], am serving as a neurosurgeon for Doctor Morene Mace   Chief Complaint: left glute pain    HPI:    10/10/2023 Patient is a 57year old female complaining of glute pain. Patient states that she has had pain for about 2-3 months. She was startled and fell on to a heater thinks that may be the cause of the pain. Hx of DDD has been using hydrocodone  and that helps with the pain. Does endorse antalgic gait. Does endorse some numbness. She has constant pain    11/26/2023 Patient states  Relevant Historical Information: Hypertension, GERD, DM type II, history of breast cancer, Additional pertinent review of systems negative.   Current Outpatient Medications:    atorvastatin  (LIPITOR) 40 MG tablet, TAKE 1 TABLET BY MOUTH AT BEDTIME, Disp: 90 tablet, Rfl: 0   blood glucose meter kit and supplies, Dispense based on patient and insurance preference. Use up to four times daily as directed. (FOR ICD-10 E10.9, E11.9)., Disp: 1 each, Rfl: 5   Blood Glucose Monitoring Suppl DEVI, 1 each by Does not apply route 2 (two) times daily. May substitute to any manufacturer covered by patient's insurance., Disp: 1 each, Rfl: 0   Blood Pressure KIT, 1 Units by Does not apply route daily., Disp: 1 kit, Rfl: 0   cetirizine  (ZYRTEC ) 10 MG tablet, Take 1 tablet by mouth once daily, Disp: 60 tablet, Rfl: 0   citalopram  (CELEXA ) 40 MG tablet, TAKE 1 TABLET BY MOUTH EVERY DAY, Disp: 90 tablet, Rfl: 2   dexlansoprazole  (DEXILANT ) 60 MG capsule, Take 1 capsule (60 mg total) by mouth daily., Disp: 90 capsule, Rfl: 3   Dulaglutide  (TRULICITY ) 0.75 MG/0.5ML SOPN, INJECT 0.75 MG SUBCUTANEOUSLY ONCE A WEEK, Disp: 6  mL, Rfl: 5   DULoxetine  (CYMBALTA ) 30 MG capsule, Take 1 capsule (30 mg total) by mouth daily., Disp: 90 capsule, Rfl: 1   empagliflozin  (JARDIANCE ) 10 MG TABS tablet, Take 1 tablet (10 mg total) by mouth daily before breakfast., Disp: 90 tablet, Rfl: 3   ENTRESTO  24-26 MG, TAKE 1 TABLET BY MOUTH TWICE A DAY, Disp: 60 tablet, Rfl: 11   fluticasone  (FLONASE ) 50 MCG/ACT nasal spray, USE 2 SPRAY(S) IN EACH NOSTRIL ONCE DAILY AS NEEDED, Disp: 48 g, Rfl: 0   HYDROcodone -acetaminophen  (NORCO) 5-325 MG tablet, Take 1 tablet by mouth every 6 (six) hours as needed for moderate pain (pain score 4-6). Do Not Fill Before  02/ 14/2025, Disp: 120 tablet, Rfl: 0   medroxyPROGESTERone  (PROVERA ) 10 MG tablet, Take two tabs po (20mg ) once daily., Disp: 60 tablet, Rfl: 0   metFORMIN  (GLUCOPHAGE -XR) 750 MG 24 hr tablet, Take 1 tablet by mouth once daily with breakfast, Disp: 90 tablet, Rfl: 0   ondansetron  (ZOFRAN -ODT) 4 MG disintegrating tablet, Take 1 tablet (4 mg total) by mouth every 8 (eight) hours as needed for nausea or vomiting., Disp: 20 tablet, Rfl: 0   Vitamin D , Ergocalciferol , (DRISDOL ) 1.25 MG (50000 UNIT) CAPS capsule, TAKE 1 CAPSULE BY MOUTH ONE TIME PER WEEK, Disp: 12 capsule, Rfl: 3   Objective:     There were no vitals filed for this visit.    There  is no height or weight on file to calculate BMI.    Physical Exam:    ***   Electronically signed by:  Odis Mace [name] 9:35 AM 11/25/23

## 2023-11-26 ENCOUNTER — Ambulatory Visit: Payer: Medicaid Other | Admitting: Sports Medicine

## 2023-11-26 ENCOUNTER — Telehealth: Payer: Self-pay | Admitting: Internal Medicine

## 2023-11-26 MED ORDER — EMPAGLIFLOZIN 10 MG PO TABS: 10.0000 mg | ORAL_TABLET | Freq: Every day | ORAL | 0 refills | Status: DC

## 2023-11-26 NOTE — Telephone Encounter (Signed)
*  STAT* If patient is at the pharmacy, call can be transferred to refill team.   1. Which medications need to be refilled? (please list name of each medication and dose if known) empagliflozin  (JARDIANCE ) 10 MG TABS tablet   2. Which pharmacy/location (including street and city if local pharmacy) is medication to be sent to?  [id number] - [place], [place] - [address]      3. Do they need a 30 day or 90 day supply? 90 day

## 2023-11-27 LAB — CYTOLOGY - PAP: Diagnosis: NEGATIVE

## 2023-11-28 ENCOUNTER — Ambulatory Visit: Payer: Medicaid Other | Admitting: Gastroenterology

## 2023-11-28 DIAGNOSIS — N950 Postmenopausal bleeding: Secondary | ICD-10-CM | POA: Insufficient documentation

## 2023-11-28 NOTE — Assessment & Plan Note (Signed)
 Reviewed causes of PMB, including genitourinary syndrome of menopause, infection, trauma, polyps, urinary and GI etiologies, and malignancy.   Plan for hysteroscopy, dilation and curettage given continued bleeding despite treatment with Provera .  Discussed outpatient procedure. Reviewed that  recovery is usually 1-2 days. Risks including infections, bleeding, and damage to surrounding organs reviewed. Recommend NPO prior to midnight and reviewed medication to take on day of surgery. Dicussed use of NSAIDS as needed for pain postoperatively.  Preop checklist: Antibiotics: none DVT ppx: SCDs Postop visit: 1 week Additional clearance: medical and cardiac

## 2023-11-30 ENCOUNTER — Other Ambulatory Visit: Payer: Self-pay | Admitting: Cardiology

## 2023-12-02 NOTE — Progress Notes (Deleted)
 [name] [address] [place] [address] Phone: [phone]   Assessment and Plan:     There are no diagnoses linked to this encounter.  ***   Pertinent previous records reviewed include ***    Follow Up: ***     Subjective:   I, [name], am serving as a neurosurgeon for Doctor Morene Mace   Chief Complaint: left glute pain    HPI:    10/10/2023 Patient is a 58year old female complaining of glute pain. Patient states that she has had pain for about 2-3 months. She was startled and fell on to a heater thinks that may be the cause of the pain. Hx of DDD has been using hydrocodone  and that helps with the pain. Does endorse antalgic gait. Does endorse some numbness. She has constant pain   12/03/2023 Patient states     Relevant Historical Information: Hypertension, GERD, DM type II, history of breast cancer,  Additional pertinent review of systems negative.   Current Outpatient Medications:    atorvastatin  (LIPITOR) 40 MG tablet, TAKE 1 TABLET BY MOUTH AT BEDTIME, Disp: 90 tablet, Rfl: 0   blood glucose meter kit and supplies, Dispense based on patient and insurance preference. Use up to four times daily as directed. (FOR ICD-10 E10.9, E11.9)., Disp: 1 each, Rfl: 5   Blood Glucose Monitoring Suppl DEVI, 1 each by Does not apply route 2 (two) times daily. May substitute to any manufacturer covered by patient's insurance., Disp: 1 each, Rfl: 0   Blood Pressure KIT, 1 Units by Does not apply route daily., Disp: 1 kit, Rfl: 0   cetirizine  (ZYRTEC ) 10 MG tablet, Take 1 tablet by mouth once daily, Disp: 60 tablet, Rfl: 0   citalopram  (CELEXA ) 40 MG tablet, TAKE 1 TABLET BY MOUTH EVERY DAY, Disp: 90 tablet, Rfl: 2   dexlansoprazole  (DEXILANT ) 60 MG capsule, Take 1 capsule (60 mg total) by mouth daily., Disp: 90 capsule, Rfl: 3   Dulaglutide  (TRULICITY ) 0.75 MG/0.5ML SOPN, INJECT 0.75 MG SUBCUTANEOUSLY ONCE A WEEK,  Disp: 6 mL, Rfl: 5   DULoxetine  (CYMBALTA ) 30 MG capsule, Take 1 capsule (30 mg total) by mouth daily., Disp: 90 capsule, Rfl: 1   empagliflozin  (JARDIANCE ) 10 MG TABS tablet, Take 1 tablet (10 mg total) by mouth daily before breakfast., Disp: 30 tablet, Rfl: 0   ENTRESTO  24-26 MG, TAKE 1 TABLET BY MOUTH TWICE A DAY, Disp: 60 tablet, Rfl: 11   fluticasone  (FLONASE ) 50 MCG/ACT nasal spray, USE 2 SPRAY(S) IN EACH NOSTRIL ONCE DAILY AS NEEDED, Disp: 48 g, Rfl: 0   HYDROcodone -acetaminophen  (NORCO) 5-325 MG tablet, Take 1 tablet by mouth every 6 (six) hours as needed for moderate pain (pain score 4-6). Do Not Fill Before  02/ 14/2025, Disp: 120 tablet, Rfl: 0   medroxyPROGESTERone  (PROVERA ) 10 MG tablet, Take two tabs po (20mg ) once daily., Disp: 60 tablet, Rfl: 0   metFORMIN  (GLUCOPHAGE -XR) 750 MG 24 hr tablet, Take 1 tablet by mouth once daily with breakfast, Disp: 90 tablet, Rfl: 0   metoprolol  succinate (TOPROL -XL) 50 MG 24 hr tablet, Take 1 tablet by mouth once daily, Disp: 90 tablet, Rfl: 3   ondansetron  (ZOFRAN -ODT) 4 MG disintegrating tablet, Take 1 tablet (4 mg total) by mouth every 8 (eight) hours as needed for nausea or vomiting., Disp: 20 tablet, Rfl: 0   ondansetron  (ZOFRAN -ODT) 8 MG disintegrating tablet, DISSOLVE 1 TABLET IN MOUTH EVERY 8 HOURS AS NEEDED FOR NAUSEA  OR VOMITING, Disp: 20 tablet, Rfl: 0   Vitamin D , Ergocalciferol , (DRISDOL ) 1.25 MG (50000 UNIT) CAPS capsule, TAKE 1 CAPSULE BY MOUTH ONE TIME PER WEEK, Disp: 12 capsule, Rfl: 3   Objective:     There were no vitals filed for this visit.    There is no height or weight on file to calculate BMI.    Physical Exam:    ***   Electronically signed by:  Odis Mace D.[name] 2:48 PM 12/02/23

## 2023-12-03 ENCOUNTER — Ambulatory Visit: Payer: Medicaid Other | Admitting: Sports Medicine

## 2023-12-05 ENCOUNTER — Encounter: Payer: Self-pay | Admitting: *Deleted

## 2023-12-05 ENCOUNTER — Telehealth: Payer: Self-pay | Admitting: *Deleted

## 2023-12-05 ENCOUNTER — Encounter: Payer: Self-pay | Admitting: Gastroenterology

## 2023-12-05 ENCOUNTER — Ambulatory Visit (INDEPENDENT_AMBULATORY_CARE_PROVIDER_SITE_OTHER): Payer: Medicaid Other | Admitting: Gastroenterology

## 2023-12-05 VITALS — BP 109/72 | HR 89 | Temp 98.0°F | Ht 63.0 in | Wt 235.1 lb

## 2023-12-05 DIAGNOSIS — K219 Gastro-esophageal reflux disease without esophagitis: Secondary | ICD-10-CM

## 2023-12-05 DIAGNOSIS — R1310 Dysphagia, unspecified: Secondary | ICD-10-CM

## 2023-12-05 DIAGNOSIS — R1013 Epigastric pain: Secondary | ICD-10-CM

## 2023-12-05 DIAGNOSIS — R142 Eructation: Secondary | ICD-10-CM | POA: Diagnosis not present

## 2023-12-05 DIAGNOSIS — R6881 Early satiety: Secondary | ICD-10-CM | POA: Diagnosis not present

## 2023-12-05 DIAGNOSIS — K5900 Constipation, unspecified: Secondary | ICD-10-CM

## 2023-12-05 MED ORDER — DEXLANSOPRAZOLE 60 MG PO CPDR: 60.0000 mg | DELAYED_RELEASE_CAPSULE | Freq: Every day | ORAL | 3 refills | Status: DC

## 2023-12-05 NOTE — Patient Instructions (Signed)
Let's proceed with an upper endoscopy by Dr. Marletta Lor. You will need to hold Trulicity 1 week prior, hold Jardiance 72 hours prior, and no metformin the day of the procedure.  Continue Dexilant daily. You can add Pepcid at bedtime.   You can take apple juice, prune juice, or Miralax daily as needed for constipation!  We will see you in 3 months!  I enjoyed seeing you again today! I value our relationship and want to provide genuine, compassionate, and quality care. You may receive a survey regarding your visit with me, and I welcome your feedback! Thanks so much for taking the time to complete this. I look forward to seeing you again.      Gelene Mink, PhD, ANP-BC [facility]

## 2023-12-05 NOTE — Progress Notes (Signed)
Gastroenterology Office Note     Primary Care Physician:  Tommie Sams, DO  Primary Gastroenterologist:   Chief Complaint   Chief Complaint  Patient presents with   Follow-up    Pt arrives for follow up. Pt having excessive belching as night. Pt does not have burning sensation as in the past. Constipation due to pain meds. Having nausea.      History of Present Illness   [name] is a 58 y.o. female presenting today with a history of  abdominal pain, gastritis, history of adenomas, and FH colon cancer in mother in her 80s, who succumbed to the disease.   Last seen in Nov 2024.   No abdominal pain any longer. Excessive belching at night. Smells like eggs. Happens 2-3 times a week. Waits at least 2-3 hours before laying down. Not eating heavy at night. Notes early satiety. Heaving but no vomiting. Cold water got stuck. No solid food dysphagia.   Constipation noted on opioids. Taking narcotics every 6 hours. Tiny sips of Dr Reino Kent will help her go. Doesn't want to add a prescriptive agent.    CTA Nov 2024: normal   GES normal in 2022  Colonoscopy Nov 2022: non-bleeding internal hemorrhoids, four 5-8 mm polyps, Tubular adenomas, surveillance due in 2027.  EGD Nov 2022: gastritis, normal duodenum, multiple gastric polyps. Negative H.pylori.       Past Medical History:  Diagnosis Date   Anxiety    Arthritis    Blood transfusion without reported diagnosis 1999   due to heavy menses   Breast cancer (HCC) 05/22/2011   03/01/11, Stage 2, s/p lumpectomy, chemo/xrt   CHF (congestive heart failure) (HCC)    Complication of anesthesia    pt woke up during procedure   DDD (degenerative disc disease), lumbar    Depression 06/22/2011   Diverticulitis    DM (diabetes mellitus) (HCC) 06/22/2011   Genital warts    GERD (gastroesophageal reflux disease) 06/22/2011   Hx of adenomatous colonic polyps 10/2007   4mm sigmoid tubular adenoma, FH colon cancer, mother in  mid-50s   Hypercholesterolemia 06/22/2011   Hypertension 06/22/2011   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   Iron deficiency anemia 03/25/2015   LBBB (left bundle branch block)    Mild CAD    Morbid obesity (HCC)    NICM (nonischemic cardiomyopathy) (HCC)    Personal history of chemotherapy    Personal history of radiation therapy    PSVT (paroxysmal supraventricular tachycardia) (HCC)    Shortness of breath    STD (sexually transmitted disease)    HPV, 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   Syncope    Syncope    Vaginal bleeding 03/08/2014    Past Surgical History:  Procedure Laterality Date   back surg x2     BALLOON DILATION N/A 09/22/2021   Procedure: BALLOON DILATION;  Surgeon: Lanelle Bal, DO;  Location: [facility];  Service: Endoscopy;  Laterality: N/A;   BIOPSY  09/22/2021   Procedure: BIOPSY;  Surgeon: Lanelle Bal, DO;  Location: [facility];  Service: Endoscopy;;   BIV ICD INSERTION CRT-D N/A 04/23/2023   Procedure: BIV ICD INSERTION CRT-D;  Surgeon: Maurice Small, MD;  Location: [facility];  Service: Cardiovascular;  Laterality: N/A;   BREAST LUMPECTOMY Right 2012   CHOLECYSTECTOMY     COLONOSCOPY  11/03/07   4-mm sessile polyp removed/small internal hemorrhoids/tubular adenoma, random colon bx negative for microscopic colitis  COLONOSCOPY WITH PROPOFOL N/A 09/17/2016   Grade 2 hemorrhoids, colonic diverticulosis, ascending colon polyp (sessile serrated adenoma), 5 year surveillance   COLONOSCOPY WITH PROPOFOL N/A 09/22/2021   Procedure: COLONOSCOPY WITH PROPOFOL;  Surgeon: Lanelle Bal, DO;  Location: [facility];  Service: Endoscopy;  Laterality: N/A;  7:30am   COLONOSCOPY, ESOPHAGOGASTRODUODENOSCOPY (EGD) AND ESOPHAGEAL DILATION  01/2012   mild gastritis s/p biopsy. Empiric dilation. Normal duodenum.    DILATATION & CURETTAGE/HYSTEROSCOPY WITH MYOSURE N/A 02/10/2015   Procedure: DILATATION & CURETTAGE/HYSTEROSCOPY  WITH MYOSURE;  Surgeon: Patton Salles, MD;  Location: [facility];  Service: Gynecology;  Laterality: N/A;   ESOPHAGOGASTRODUODENOSCOPY (EGD) WITH PROPOFOL N/A 01/11/2020   Normal esophagus, s/p dilation, normal stomach, normal duodenum.    ESOPHAGOGASTRODUODENOSCOPY (EGD) WITH PROPOFOL N/A 09/22/2021   Procedure: ESOPHAGOGASTRODUODENOSCOPY (EGD) WITH PROPOFOL;  Surgeon: Lanelle Bal, DO;  Location: [facility];  Service: Endoscopy;  Laterality: N/A;   MALONEY DILATION N/A 01/11/2020   Procedure: Elease Hashimoto DILATION;  Surgeon: Corbin Ade, MD;  Location: [facility];  Service: Endoscopy;  Laterality: N/A;   MM BREAST STEREO BX*L*R/S     rt.   POLYPECTOMY  09/17/2016   Procedure: POLYPECTOMY;  Surgeon: Corbin Ade, MD;  Location: [facility];  Service: Endoscopy;;  ascending colon   POLYPECTOMY  09/22/2021   Procedure: POLYPECTOMY;  Surgeon: Lanelle Bal, DO;  Location: [facility];  Service: Endoscopy;;   PORT-A-CATH REMOVAL  05/26/2012   Procedure: REMOVAL PORT-A-CATH;  Surgeon: [name], MD;  Location: [facility];  Service: General;  Laterality: N/A;  Minor Room   PORTACATH PLACEMENT      Current Outpatient Medications  Medication Sig Dispense Refill   atorvastatin (LIPITOR) 40 MG tablet TAKE 1 TABLET BY MOUTH AT BEDTIME 90 tablet 0   blood glucose meter kit and supplies Dispense based on patient and insurance preference. Use up to four times daily as directed. (FOR ICD-10 E10.9, E11.9). 1 each 5   Blood Glucose Monitoring Suppl DEVI 1 each by Does not apply route 2 (two) times daily. May substitute to any manufacturer covered by patient's insurance. 1 each 0   Blood Pressure KIT 1 Units by Does not apply route daily. 1 kit 0   cetirizine (ZYRTEC) 10 MG tablet Take 1 tablet by mouth once daily 60 tablet 0   citalopram (CELEXA) 40 MG tablet TAKE 1 TABLET BY MOUTH EVERY DAY 90 tablet 2   dexlansoprazole (DEXILANT) 60 MG capsule Take 1 capsule (60 mg total) by mouth  daily. 90 capsule 3   Dulaglutide (TRULICITY) 0.75 MG/0.5ML SOPN INJECT 0.75 MG SUBCUTANEOUSLY ONCE A WEEK 6 mL 5   DULoxetine (CYMBALTA) 30 MG capsule Take 1 capsule (30 mg total) by mouth daily. 90 capsule 1   empagliflozin (JARDIANCE) 10 MG TABS tablet Take 1 tablet (10 mg total) by mouth daily before breakfast. 30 tablet 0   ENTRESTO 24-26 MG TAKE 1 TABLET BY MOUTH TWICE A DAY 60 tablet 11   fluticasone (FLONASE) 50 MCG/ACT nasal spray USE 2 SPRAY(S) IN EACH NOSTRIL ONCE DAILY AS NEEDED 48 g 0   HYDROcodone-acetaminophen (NORCO) 5-325 MG tablet Take 1 tablet by mouth every 6 (six) hours as needed for moderate pain (pain score 4-6). Do Not Fill Before  02/ 14/2025 120 tablet 0   medroxyPROGESTERone (PROVERA) 10 MG tablet Take two tabs po (20mg ) once daily. 60 tablet 0   metFORMIN (GLUCOPHAGE-XR) 750 MG 24 hr tablet Take 1  tablet by mouth once daily with breakfast 90 tablet 0   metoprolol succinate (TOPROL-XL) 50 MG 24 hr tablet Take 1 tablet by mouth once daily 90 tablet 3   ondansetron (ZOFRAN-ODT) 4 MG disintegrating tablet Take 1 tablet (4 mg total) by mouth every 8 (eight) hours as needed for nausea or vomiting. 20 tablet 0   ondansetron (ZOFRAN-ODT) 8 MG disintegrating tablet DISSOLVE 1 TABLET IN MOUTH EVERY 8 HOURS AS NEEDED FOR NAUSEA OR VOMITING 20 tablet 0   Vitamin D, Ergocalciferol, (DRISDOL) 1.25 MG (50000 UNIT) CAPS capsule TAKE 1 CAPSULE BY MOUTH ONE TIME PER WEEK 12 capsule 3   No current facility-administered medications for this visit.    Allergies as of 12/05/2023 - Review Complete 12/05/2023  Allergen Reaction Noted   Flagyl [metronidazole] Hives, Itching, and Swelling 10/15/2017   Lansoprazole Other (See Comments) 12/26/2017   Pravastatin Other (See Comments) 08/02/2015    Family History  Problem Relation Age of Onset   Colon cancer Mother        mid-50s, died of metastatic disease   Cancer Mother        liver   Coronary artery disease Mother    Diabetes type I  Mother    Peripheral vascular disease Mother        Carotid disease in her 20s   Coronary artery disease Father        CAD in his 14s   Diabetes Father    Diabetes type I Father    Hypertension Father    Bipolar disorder Sister    Coronary artery disease Brother        MI in his 87s   Hypertension Brother    Hypertension Maternal Grandmother    Hyperlipidemia Maternal Grandmother    Stroke Maternal Grandmother    Hypertension Maternal Grandfather    Diabetes Maternal Grandfather    Diabetes Paternal Grandmother    Heart disease Paternal Grandfather     Social History   Socioeconomic History   Marital status: Married    Spouse name: Not on file   Number of children: 3   Years of education: Not on file   Highest education level: Some college, no degree  Occupational History   Occupation: child care    Employer: [employer]  Tobacco Use   Smoking status: Never   Smokeless tobacco: Never  Vaping Use   Vaping status: Never Used  Substance and Sexual Activity   Alcohol use: No    Alcohol/week: 0.0 standard drinks of alcohol   Drug use: No   Sexual activity: Yes    Partners: Male    Birth control/protection: Post-menopausal  Other Topics Concern   Not on file  Social History Narrative   ** Merged History Encounter **       Social Drivers of Health   Financial Resource Strain: Medium Risk (03/11/2023)   Overall Financial Resource Strain (CARDIA)    Difficulty of Paying Living Expenses: Somewhat hard  Food Insecurity: Food Insecurity Present (03/11/2023)   Hunger Vital Sign    Worried About Running Out of Food in the Last Year: Sometimes true    Ran Out of Food in the Last Year: Sometimes true  Transportation Needs: No Transportation Needs (03/11/2023)   PRAPARE - Administrator, Civil Service (Medical): No    Lack of Transportation (Non-Medical): No  Physical Activity: Unknown (03/11/2023)   Exercise Vital Sign    Days of Exercise per Week:  Patient declined  Minutes of Exercise per Session: Not on file  Stress: Stress Concern Present (03/11/2023)   [employer] of Occupational Health - Occupational Stress Questionnaire    Feeling of Stress : To some extent  Social Connections: Moderately Isolated (03/11/2023)   Social Connection and Isolation Panel [NHANES]    Frequency of Communication with Friends and Family: More than three times a week    Frequency of Social Gatherings with Friends and Family: Twice a week    Attends Religious Services: Never    Database administrator or Organizations: No    Attends Engineer, structural: Not on file    Marital Status: Married  Catering manager Violence: Not on file     Review of Systems   Gen: Denies any fever, chills, fatigue, weight loss, lack of appetite.  CV: Denies chest pain, heart palpitations, peripheral edema, syncope.  Resp: Denies shortness of breath at rest or with exertion. Denies wheezing or cough.  GI: Denies dysphagia or odynophagia. Denies jaundice, hematemesis, fecal incontinence. GU : Denies urinary burning, urinary frequency, urinary hesitancy MS: Denies joint pain, muscle weakness, cramps, or limitation of movement.  Derm: Denies rash, itching, dry skin Psych: Denies depression, anxiety, memory loss, and confusion Heme: Denies bruising, bleeding, and enlarged lymph nodes.   Physical Exam   BP 109/72   Pulse 89   Temp 98 F (36.7 C)   Ht 5\' 3"  (1.6 m)   Wt 235 lb 1.6 oz (106.6 kg)   LMP 04/05/2016 Comment: spotting 11/2016 and 12/2016  BMI 41.65 kg/m  General:   Alert and oriented. Pleasant and cooperative. Well-nourished and well-developed.  Head:  Normocephalic and atraumatic. Eyes:  Without icterus Abdomen:  +BS, soft, mild TTP epigastric and non-distended. No HSM noted. No guarding or rebound. No masses appreciated.  Rectal:  Deferred  Msk:  Symmetrical without gross deformities. Normal posture. Extremities:  Without  edema. Neurologic:  Alert and  oriented x4;  grossly normal neurologically. Skin:  Intact without significant lesions or rashes. Psych:  Alert and cooperative. Normal mood and affect.   Assessment   [name] is a 58 y.o. female presenting today with a history of abdominal pain, gastritis, history of adenomas, and FH colon cancer in mother in her 9s, who succumbed to the disease.   Dyspepsia: noting excessive belching, early satiety, heaving but without vomiting, liquid dysphagia but no solid food dysphagia. GES normal in 2022. Last EGD in Nov 2022. Will continue Dexilant daily, add Pepcid prn at bedtime, pursue EGD in near future. Suspect she does have transient delayed gastric emptying in setting of diabetes and med effect and could consider updating GES in the future.      PLAN    Proceed with upper endoscopy by Dr. Marletta Lor in near future: the risks, benefits, and alternatives have been discussed with the patient in detail. The patient states understanding and desires to proceed.  Continue Dexilant daily, Pepcid at bedtime prn OTC options for constipation: she wishes to avoid prescriptive at this point 3 month follow-up   Gelene Mink, PhD, ANP-BC [facility]

## 2023-12-05 NOTE — Telephone Encounter (Signed)
[employer] PA:  Certification Number [id number]  Status PENDED  Reference Number [id number]  Review Reason 1 Disposition pending review

## 2023-12-05 NOTE — H&P (View-Only) (Signed)
Gastroenterology Office Note     Primary Care Physician:  Tommie Sams, DO  Primary Gastroenterologist:   Chief Complaint   Chief Complaint  Patient presents with   Follow-up    Pt arrives for follow up. Pt having excessive belching as night. Pt does not have burning sensation as in the past. Constipation due to pain meds. Having nausea.      History of Present Illness   [name] is a 58 y.o. female presenting today with a history of  abdominal pain, gastritis, history of adenomas, and FH colon cancer in mother in her 80s, who succumbed to the disease.   Last seen in Nov 2024.   No abdominal pain any longer. Excessive belching at night. Smells like eggs. Happens 2-3 times a week. Waits at least 2-3 hours before laying down. Not eating heavy at night. Notes early satiety. Heaving but no vomiting. Cold water got stuck. No solid food dysphagia.   Constipation noted on opioids. Taking narcotics every 6 hours. Tiny sips of Dr Reino Kent will help her go. Doesn't want to add a prescriptive agent.    CTA Nov 2024: normal   GES normal in 2022  Colonoscopy Nov 2022: non-bleeding internal hemorrhoids, four 5-8 mm polyps, Tubular adenomas, surveillance due in 2027.  EGD Nov 2022: gastritis, normal duodenum, multiple gastric polyps. Negative H.pylori.       Past Medical History:  Diagnosis Date   Anxiety    Arthritis    Blood transfusion without reported diagnosis 1999   due to heavy menses   Breast cancer (HCC) 05/22/2011   03/01/11, Stage 2, s/p lumpectomy, chemo/xrt   CHF (congestive heart failure) (HCC)    Complication of anesthesia    pt woke up during procedure   DDD (degenerative disc disease), lumbar    Depression 06/22/2011   Diverticulitis    DM (diabetes mellitus) (HCC) 06/22/2011   Genital warts    GERD (gastroesophageal reflux disease) 06/22/2011   Hx of adenomatous colonic polyps 10/2007   4mm sigmoid tubular adenoma, FH colon cancer, mother in  mid-50s   Hypercholesterolemia 06/22/2011   Hypertension 06/22/2011   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   Iron deficiency anemia 03/25/2015   LBBB (left bundle branch block)    Mild CAD    Morbid obesity (HCC)    NICM (nonischemic cardiomyopathy) (HCC)    Personal history of chemotherapy    Personal history of radiation therapy    PSVT (paroxysmal supraventricular tachycardia) (HCC)    Shortness of breath    STD (sexually transmitted disease)    HPV, 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   Syncope    Syncope    Vaginal bleeding 03/08/2014    Past Surgical History:  Procedure Laterality Date   back surg x2     BALLOON DILATION N/A 09/22/2021   Procedure: BALLOON DILATION;  Surgeon: Lanelle Bal, DO;  Location: [facility];  Service: Endoscopy;  Laterality: N/A;   BIOPSY  09/22/2021   Procedure: BIOPSY;  Surgeon: Lanelle Bal, DO;  Location: [facility];  Service: Endoscopy;;   BIV ICD INSERTION CRT-D N/A 04/23/2023   Procedure: BIV ICD INSERTION CRT-D;  Surgeon: Maurice Small, MD;  Location: [facility];  Service: Cardiovascular;  Laterality: N/A;   BREAST LUMPECTOMY Right 2012   CHOLECYSTECTOMY     COLONOSCOPY  11/03/07   4-mm sessile polyp removed/small internal hemorrhoids/tubular adenoma, random colon bx negative for microscopic colitis  COLONOSCOPY WITH PROPOFOL N/A 09/17/2016   Grade 2 hemorrhoids, colonic diverticulosis, ascending colon polyp (sessile serrated adenoma), 5 year surveillance   COLONOSCOPY WITH PROPOFOL N/A 09/22/2021   Procedure: COLONOSCOPY WITH PROPOFOL;  Surgeon: Lanelle Bal, DO;  Location: [facility];  Service: Endoscopy;  Laterality: N/A;  7:30am   COLONOSCOPY, ESOPHAGOGASTRODUODENOSCOPY (EGD) AND ESOPHAGEAL DILATION  01/2012   mild gastritis s/p biopsy. Empiric dilation. Normal duodenum.    DILATATION & CURETTAGE/HYSTEROSCOPY WITH MYOSURE N/A 02/10/2015   Procedure: DILATATION & CURETTAGE/HYSTEROSCOPY  WITH MYOSURE;  Surgeon: Patton Salles, MD;  Location: [facility];  Service: Gynecology;  Laterality: N/A;   ESOPHAGOGASTRODUODENOSCOPY (EGD) WITH PROPOFOL N/A 01/11/2020   Normal esophagus, s/p dilation, normal stomach, normal duodenum.    ESOPHAGOGASTRODUODENOSCOPY (EGD) WITH PROPOFOL N/A 09/22/2021   Procedure: ESOPHAGOGASTRODUODENOSCOPY (EGD) WITH PROPOFOL;  Surgeon: Lanelle Bal, DO;  Location: [facility];  Service: Endoscopy;  Laterality: N/A;   MALONEY DILATION N/A 01/11/2020   Procedure: Elease Hashimoto DILATION;  Surgeon: Corbin Ade, MD;  Location: [facility];  Service: Endoscopy;  Laterality: N/A;   MM BREAST STEREO BX*L*R/S     rt.   POLYPECTOMY  09/17/2016   Procedure: POLYPECTOMY;  Surgeon: Corbin Ade, MD;  Location: [facility];  Service: Endoscopy;;  ascending colon   POLYPECTOMY  09/22/2021   Procedure: POLYPECTOMY;  Surgeon: Lanelle Bal, DO;  Location: [facility];  Service: Endoscopy;;   PORT-A-CATH REMOVAL  05/26/2012   Procedure: REMOVAL PORT-A-CATH;  Surgeon: [name], MD;  Location: [facility];  Service: General;  Laterality: N/A;  Minor Room   PORTACATH PLACEMENT      Current Outpatient Medications  Medication Sig Dispense Refill   atorvastatin (LIPITOR) 40 MG tablet TAKE 1 TABLET BY MOUTH AT BEDTIME 90 tablet 0   blood glucose meter kit and supplies Dispense based on patient and insurance preference. Use up to four times daily as directed. (FOR ICD-10 E10.9, E11.9). 1 each 5   Blood Glucose Monitoring Suppl DEVI 1 each by Does not apply route 2 (two) times daily. May substitute to any manufacturer covered by patient's insurance. 1 each 0   Blood Pressure KIT 1 Units by Does not apply route daily. 1 kit 0   cetirizine (ZYRTEC) 10 MG tablet Take 1 tablet by mouth once daily 60 tablet 0   citalopram (CELEXA) 40 MG tablet TAKE 1 TABLET BY MOUTH EVERY DAY 90 tablet 2   dexlansoprazole (DEXILANT) 60 MG capsule Take 1 capsule (60 mg total) by mouth  daily. 90 capsule 3   Dulaglutide (TRULICITY) 0.75 MG/0.5ML SOPN INJECT 0.75 MG SUBCUTANEOUSLY ONCE A WEEK 6 mL 5   DULoxetine (CYMBALTA) 30 MG capsule Take 1 capsule (30 mg total) by mouth daily. 90 capsule 1   empagliflozin (JARDIANCE) 10 MG TABS tablet Take 1 tablet (10 mg total) by mouth daily before breakfast. 30 tablet 0   ENTRESTO 24-26 MG TAKE 1 TABLET BY MOUTH TWICE A DAY 60 tablet 11   fluticasone (FLONASE) 50 MCG/ACT nasal spray USE 2 SPRAY(S) IN EACH NOSTRIL ONCE DAILY AS NEEDED 48 g 0   HYDROcodone-acetaminophen (NORCO) 5-325 MG tablet Take 1 tablet by mouth every 6 (six) hours as needed for moderate pain (pain score 4-6). Do Not Fill Before  02/ 14/2025 120 tablet 0   medroxyPROGESTERone (PROVERA) 10 MG tablet Take two tabs po (20mg ) once daily. 60 tablet 0   metFORMIN (GLUCOPHAGE-XR) 750 MG 24 hr tablet Take 1  tablet by mouth once daily with breakfast 90 tablet 0   metoprolol succinate (TOPROL-XL) 50 MG 24 hr tablet Take 1 tablet by mouth once daily 90 tablet 3   ondansetron (ZOFRAN-ODT) 4 MG disintegrating tablet Take 1 tablet (4 mg total) by mouth every 8 (eight) hours as needed for nausea or vomiting. 20 tablet 0   ondansetron (ZOFRAN-ODT) 8 MG disintegrating tablet DISSOLVE 1 TABLET IN MOUTH EVERY 8 HOURS AS NEEDED FOR NAUSEA OR VOMITING 20 tablet 0   Vitamin D, Ergocalciferol, (DRISDOL) 1.25 MG (50000 UNIT) CAPS capsule TAKE 1 CAPSULE BY MOUTH ONE TIME PER WEEK 12 capsule 3   No current facility-administered medications for this visit.    Allergies as of 12/05/2023 - Review Complete 12/05/2023  Allergen Reaction Noted   Flagyl [metronidazole] Hives, Itching, and Swelling 10/15/2017   Lansoprazole Other (See Comments) 12/26/2017   Pravastatin Other (See Comments) 08/02/2015    Family History  Problem Relation Age of Onset   Colon cancer Mother        mid-50s, died of metastatic disease   Cancer Mother        liver   Coronary artery disease Mother    Diabetes type I  Mother    Peripheral vascular disease Mother        Carotid disease in her 20s   Coronary artery disease Father        CAD in his 14s   Diabetes Father    Diabetes type I Father    Hypertension Father    Bipolar disorder Sister    Coronary artery disease Brother        MI in his 87s   Hypertension Brother    Hypertension Maternal Grandmother    Hyperlipidemia Maternal Grandmother    Stroke Maternal Grandmother    Hypertension Maternal Grandfather    Diabetes Maternal Grandfather    Diabetes Paternal Grandmother    Heart disease Paternal Grandfather     Social History   Socioeconomic History   Marital status: Married    Spouse name: Not on file   Number of children: 3   Years of education: Not on file   Highest education level: Some college, no degree  Occupational History   Occupation: child care    Employer: [employer]  Tobacco Use   Smoking status: Never   Smokeless tobacco: Never  Vaping Use   Vaping status: Never Used  Substance and Sexual Activity   Alcohol use: No    Alcohol/week: 0.0 standard drinks of alcohol   Drug use: No   Sexual activity: Yes    Partners: Male    Birth control/protection: Post-menopausal  Other Topics Concern   Not on file  Social History Narrative   ** Merged History Encounter **       Social Drivers of Health   Financial Resource Strain: Medium Risk (03/11/2023)   Overall Financial Resource Strain (CARDIA)    Difficulty of Paying Living Expenses: Somewhat hard  Food Insecurity: Food Insecurity Present (03/11/2023)   Hunger Vital Sign    Worried About Running Out of Food in the Last Year: Sometimes true    Ran Out of Food in the Last Year: Sometimes true  Transportation Needs: No Transportation Needs (03/11/2023)   PRAPARE - Administrator, Civil Service (Medical): No    Lack of Transportation (Non-Medical): No  Physical Activity: Unknown (03/11/2023)   Exercise Vital Sign    Days of Exercise per Week:  Patient declined  Minutes of Exercise per Session: Not on file  Stress: Stress Concern Present (03/11/2023)   [employer] of Occupational Health - Occupational Stress Questionnaire    Feeling of Stress : To some extent  Social Connections: Moderately Isolated (03/11/2023)   Social Connection and Isolation Panel [NHANES]    Frequency of Communication with Friends and Family: More than three times a week    Frequency of Social Gatherings with Friends and Family: Twice a week    Attends Religious Services: Never    Database administrator or Organizations: No    Attends Engineer, structural: Not on file    Marital Status: Married  Catering manager Violence: Not on file     Review of Systems   Gen: Denies any fever, chills, fatigue, weight loss, lack of appetite.  CV: Denies chest pain, heart palpitations, peripheral edema, syncope.  Resp: Denies shortness of breath at rest or with exertion. Denies wheezing or cough.  GI: Denies dysphagia or odynophagia. Denies jaundice, hematemesis, fecal incontinence. GU : Denies urinary burning, urinary frequency, urinary hesitancy MS: Denies joint pain, muscle weakness, cramps, or limitation of movement.  Derm: Denies rash, itching, dry skin Psych: Denies depression, anxiety, memory loss, and confusion Heme: Denies bruising, bleeding, and enlarged lymph nodes.   Physical Exam   BP 109/72   Pulse 89   Temp 98 F (36.7 C)   Ht 5\' 3"  (1.6 m)   Wt 235 lb 1.6 oz (106.6 kg)   LMP 04/05/2016 Comment: spotting 11/2016 and 12/2016  BMI 41.65 kg/m  General:   Alert and oriented. Pleasant and cooperative. Well-nourished and well-developed.  Head:  Normocephalic and atraumatic. Eyes:  Without icterus Abdomen:  +BS, soft, mild TTP epigastric and non-distended. No HSM noted. No guarding or rebound. No masses appreciated.  Rectal:  Deferred  Msk:  Symmetrical without gross deformities. Normal posture. Extremities:  Without  edema. Neurologic:  Alert and  oriented x4;  grossly normal neurologically. Skin:  Intact without significant lesions or rashes. Psych:  Alert and cooperative. Normal mood and affect.   Assessment   [name] is a 58 y.o. female presenting today with a history of abdominal pain, gastritis, history of adenomas, and FH colon cancer in mother in her 9s, who succumbed to the disease.   Dyspepsia: noting excessive belching, early satiety, heaving but without vomiting, liquid dysphagia but no solid food dysphagia. GES normal in 2022. Last EGD in Nov 2022. Will continue Dexilant daily, add Pepcid prn at bedtime, pursue EGD in near future. Suspect she does have transient delayed gastric emptying in setting of diabetes and med effect and could consider updating GES in the future.      PLAN    Proceed with upper endoscopy by Dr. Marletta Lor in near future: the risks, benefits, and alternatives have been discussed with the patient in detail. The patient states understanding and desires to proceed.  Continue Dexilant daily, Pepcid at bedtime prn OTC options for constipation: she wishes to avoid prescriptive at this point 3 month follow-up   Gelene Mink, PhD, ANP-BC [facility]

## 2023-12-06 ENCOUNTER — Ambulatory Visit
Admission: RE | Admit: 2023-12-06 | Discharge: 2023-12-06 | Disposition: A | Discharge status: Home / Self Care | Payer: Medicaid Other | Source: Ambulatory Visit | Attending: Nurse Practitioner

## 2023-12-06 VITALS — BP 132/82 | HR 99 | Temp 98.2°F | Resp 20

## 2023-12-06 DIAGNOSIS — H6593 Unspecified nonsuppurative otitis media, bilateral: Secondary | ICD-10-CM | POA: Diagnosis not present

## 2023-12-06 MED ORDER — PREDNISONE 20 MG PO TABS
40.0000 mg | ORAL_TABLET | Freq: Every day | ORAL | 0 refills | Status: AC
Start: 2023-12-06 — End: 2023-12-11

## 2023-12-06 NOTE — ED Triage Notes (Signed)
Pt reports she has a runny nose, cough with chest heaviness, left ear drainage, and feels weak x 1 week

## 2023-12-06 NOTE — ED Provider Notes (Signed)
[facility]    CSN: [id number] Arrival date & time: 12/06/23  1334      History   Chief Complaint Chief Complaint  Patient presents with   Cough    HPI [name] is a 59 y.o. female.   The history is provided by the patient.   The patient presents for complaints of runny nose, cough with chest heaviness, left ear drainage, bilateral ear pressure, dizziness, and weakness that been present for the past week.  Denies fever, chills, headache, nasal congestion, wheezing, difficulty breathing, chest pain, abdominal pain, nausea, vomiting, diarrhea, or rash.  Patient reports she is not taking any medication for her symptoms due to her pacemaker, and was not sure of what she should be taking.  Patient reports that she does have Flonase, she has been taking Zyrtec daily.  She also reports that she was started on amoxicillin 500 mg 3 times daily by her dentist for prophylaxis prior to dental work.  Past Medical History:  Diagnosis Date   Anxiety    Arthritis    Blood transfusion without reported diagnosis 1999   due to heavy menses   Breast cancer (HCC) 05/22/2011   03/01/11, Stage 2, s/p lumpectomy, chemo/xrt   CHF (congestive heart failure) (HCC)    Complication of anesthesia    pt woke up during procedure   DDD (degenerative disc disease), lumbar    Depression 06/22/2011   Diverticulitis    DM (diabetes mellitus) (HCC) 06/22/2011   Genital warts    GERD (gastroesophageal reflux disease) 06/22/2011   Hx of adenomatous colonic polyps 10/2007   4mm sigmoid tubular adenoma, FH colon cancer, mother in mid-50s   Hypercholesterolemia 06/22/2011   Hypertension 06/22/2011   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   Iron deficiency anemia 03/25/2015   LBBB (left bundle branch block)    Mild CAD    Morbid obesity (HCC)    NICM (nonischemic cardiomyopathy) (HCC)    Personal history of chemotherapy    Personal history of radiation therapy    PSVT  (paroxysmal supraventricular tachycardia) (HCC)    Shortness of breath    STD (sexually transmitted disease)    HPV, 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   Syncope    Syncope    Vaginal bleeding 03/08/2014    Patient Active Problem List   Diagnosis Date Noted   Postmenopausal bleeding 11/28/2023   Lumbar radiculopathy 09/07/2022   NICM (nonischemic cardiomyopathy) (HCC) 08/29/2022   CAD (coronary artery disease) 08/29/2022   Aortic atherosclerosis (HCC) 08/29/2022   Lipoma 05/16/2022   Syncope 05/02/2022   Chronic pain syndrome 05/01/2022   LBBB (left bundle branch block) 05/01/2022   Dyspepsia 08/09/2021   Constipation 03/21/2021   Diabetes mellitus without complication (HCC) 12/22/2020   Abdominal pain 12/22/2019   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   Fatty liver 09/06/2016   H/O adenomatous polyp of colon 01/24/2012   FH: colon cancer 01/24/2012   GERD (gastroesophageal reflux disease) 06/22/2011   Hyperlipidemia 06/22/2011   Essential hypertension 06/22/2011   Obesity 06/22/2011   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    Past Surgical History:  Procedure Laterality Date   back surg x2     BALLOON DILATION N/A 09/22/2021   Procedure: BALLOON DILATION;  Surgeon: Lanelle Bal, DO;  [facility]: [facility];  Service: Endoscopy;  Laterality: N/A;   BIOPSY  09/22/2021   Procedure: BIOPSY;  Surgeon: Earnest Bailey  K, DO;  [facility]: [facility];  Service: Endoscopy;;   BIV ICD INSERTION CRT-D N/A 04/23/2023   Procedure: BIV ICD INSERTION CRT-D;  Surgeon: Mealor, Roberts Gaudy, MD;  Location: [facility];  Service: Cardiovascular;  Laterality: N/A;   BREAST LUMPECTOMY Right 2012   CHOLECYSTECTOMY     COLONOSCOPY  11/03/07   4-mm sessile polyp removed/small internal hemorrhoids/tubular adenoma, random colon bx negative for microscopic colitis   COLONOSCOPY WITH PROPOFOL N/A 09/17/2016   Grade 2  hemorrhoids, colonic diverticulosis, ascending colon polyp (sessile serrated adenoma), 5 year surveillance   COLONOSCOPY WITH PROPOFOL N/A 09/22/2021   Procedure: COLONOSCOPY WITH PROPOFOL;  Surgeon: Lanelle Bal, DO;  [facility]: [facility];  Service: Endoscopy;  Laterality: N/A;  7:30am   COLONOSCOPY, ESOPHAGOGASTRODUODENOSCOPY (EGD) AND ESOPHAGEAL DILATION  01/2012   mild gastritis s/p biopsy. Empiric dilation. Normal duodenum.    DILATATION & CURETTAGE/HYSTEROSCOPY WITH MYOSURE N/A 02/10/2015   Procedure: DILATATION & CURETTAGE/HYSTEROSCOPY WITH MYOSURE;  Surgeon: Patton Salles, MD;  Location: [facility];  Service: Gynecology;  Laterality: N/A;   ESOPHAGOGASTRODUODENOSCOPY (EGD) WITH PROPOFOL N/A 01/11/2020   Normal esophagus, s/p dilation, normal stomach, normal duodenum.    ESOPHAGOGASTRODUODENOSCOPY (EGD) WITH PROPOFOL N/A 09/22/2021   Procedure: ESOPHAGOGASTRODUODENOSCOPY (EGD) WITH PROPOFOL;  Surgeon: Lanelle Bal, DO;  [facility];  Service: Endoscopy;  Laterality: N/A;   MALONEY DILATION N/A 01/11/2020   Procedure: Elease Hashimoto DILATION;  Surgeon: Corbin Ade, MD;  [facility]: [facility];  Service: Endoscopy;  Laterality: N/A;   MM BREAST STEREO BX*L*R/S     rt.   POLYPECTOMY  09/17/2016   Procedure: POLYPECTOMY;  Surgeon: Corbin Ade, MD;  [facility];  Service: Endoscopy;;  ascending colon   POLYPECTOMY  09/22/2021   Procedure: POLYPECTOMY;  Surgeon: Lanelle Bal, DO;  [facility];  Service: Endoscopy;;   PORT-A-CATH REMOVAL  05/26/2012   Procedure: REMOVAL PORT-A-CATH;  Surgeon: [name], MD;  Location: [facility];  Service: General;  Laterality: N/A;  Minor Room   PORTACATH PLACEMENT      OB History     Gravida  4   Para  3   Term  0   Preterm  0   AB  1   Living  3      SAB  0   IAB  0   Ectopic  0   Multiple      Live Births  3            Home Medications    Prior to Admission  medications   Medication Sig Start Date End Date Taking? Authorizing Provider  predniSONE (DELTASONE) 20 MG tablet Take 2 tablets (40 mg total) by mouth daily with breakfast for 5 days. 12/06/23 12/11/23 Yes Leath-Warren, Sadie Haber, NP  atorvastatin (LIPITOR) 40 MG tablet TAKE 1 TABLET BY MOUTH AT BEDTIME 10/16/23   Cook, Kipton G, DO  blood glucose meter kit and supplies Dispense based on patient and insurance preference. Use up to four times daily as directed. (FOR ICD-10 E10.9, E11.9). 01/05/20   Merlyn Albert, MD  Blood Glucose Monitoring Suppl DEVI 1 each by Does not apply route 2 (two) times daily. May substitute to any manufacturer covered by patient's insurance. 07/18/23   Campbell Riches, NP  Blood Pressure KIT 1 Units by Does not apply route daily. 02/28/23   Mallipeddi, Vishnu P, MD  cetirizine (ZYRTEC) 10 MG tablet Take  1 tablet by mouth once daily 09/30/23   Cook, Valdez G, DO  citalopram (CELEXA) 40 MG tablet TAKE 1 TABLET BY MOUTH EVERY DAY 05/08/23   Tommie Sams, DO  dexlansoprazole (DEXILANT) 60 MG capsule Take 1 capsule (60 mg total) by mouth daily. 12/05/23   Gelene Mink, NP  Dulaglutide (TRULICITY) 0.75 MG/0.5ML SOPN INJECT 0.75 MG SUBCUTANEOUSLY ONCE A WEEK 04/29/23   Everlene Other G, DO  DULoxetine (CYMBALTA) 30 MG capsule Take 1 capsule (30 mg total) by mouth daily. 09/06/23   Lovorn, Aundra Millet, MD  empagliflozin (JARDIANCE) 10 MG TABS tablet Take 1 tablet (10 mg total) by mouth daily before breakfast. 11/26/23   Mallipeddi, Orion Modest, MD  ENTRESTO 24-26 MG TAKE 1 TABLET BY MOUTH TWICE A DAY 06/10/23   Jake Bathe, MD  fluticasone (FLONASE) 50 MCG/ACT nasal spray USE 2 SPRAY(S) IN EACH NOSTRIL ONCE DAILY AS NEEDED 10/16/23   Tommie Sams, DO  HYDROcodone-acetaminophen (NORCO) 5-325 MG tablet Take 1 tablet by mouth every 6 (six) hours as needed for moderate pain (pain score 4-6). Do Not Fill Before  02/ 14/2025 11/14/23   Jones Bales, NP  medroxyPROGESTERone (PROVERA) 10 MG  tablet Take two tabs po (20mg ) once daily. 11/06/23   Patton Salles, MD  metFORMIN (GLUCOPHAGE-XR) 750 MG 24 hr tablet Take 1 tablet by mouth once daily with breakfast 10/16/23   Everlene Other G, DO  metoprolol succinate (TOPROL-XL) 50 MG 24 hr tablet Take 1 tablet by mouth once daily 12/02/23   Mallipeddi, Vishnu P, MD  ondansetron (ZOFRAN-ODT) 4 MG disintegrating tablet Take 1 tablet (4 mg total) by mouth every 8 (eight) hours as needed for nausea or vomiting. 10/21/23   Everlene Other G, DO  ondansetron (ZOFRAN-ODT) 8 MG disintegrating tablet DISSOLVE 1 TABLET IN MOUTH EVERY 8 HOURS AS NEEDED FOR NAUSEA OR VOMITING 11/25/23   Tommie Sams, DO  Vitamin D, Ergocalciferol, (DRISDOL) 1.25 MG (50000 UNIT) CAPS capsule TAKE 1 CAPSULE BY MOUTH ONE TIME PER WEEK 04/30/23   Doreatha Massed, MD    Family History Family History  Problem Relation Age of Onset   Colon cancer Mother        mid-50s, died of metastatic disease   Cancer Mother        liver   Coronary artery disease Mother    Diabetes type I Mother    Peripheral vascular disease Mother        Carotid disease in her 76s   Coronary artery disease Father        CAD in his 30s   Diabetes Father    Diabetes type I Father    Hypertension Father    Bipolar disorder Sister    Coronary artery disease Brother        MI in his 50s   Hypertension Brother    Hypertension Maternal Grandmother    Hyperlipidemia Maternal Grandmother    Stroke Maternal Grandmother    Hypertension Maternal Grandfather    Diabetes Maternal Grandfather    Diabetes Paternal Grandmother    Heart disease Paternal Grandfather     Social History Social History   Tobacco Use   Smoking status: Never   Smokeless tobacco: Never  Vaping Use   Vaping status: Never Used  Substance Use Topics   Alcohol use: No    Alcohol/week: 0.0 standard drinks of alcohol   Drug use: No     Allergies   Flagyl [metronidazole], Lansoprazole, and  Pravastatin   Review  of Systems Review of Systems Per HPI  Physical Exam Triage Vital Signs ED Triage Vitals  Encounter Vitals Group     BP 12/06/23 1406 132/82     Systolic BP Percentile --      Diastolic BP Percentile --      Pulse Rate 12/06/23 1406 99     Resp 12/06/23 1406 20     Temp 12/06/23 1406 98.2 F (36.8 C)     Temp Source 12/06/23 1406 Oral     SpO2 12/06/23 1406 94 %     Weight --      Height --      Head Circumference --      Peak Flow --      Pain Score 12/06/23 1407 0     Pain Loc --      Pain Education --      Exclude from Growth Chart --    No data found.  Updated Vital Signs BP 132/82 (BP Location: Left Wrist)   Pulse 99   Temp 98.2 F (36.8 C) (Oral)   Resp 20   LMP 04/05/2016 Comment: spotting 11/2016 and 12/2016  SpO2 94%   Visual Acuity Right Eye Distance:   Left Eye Distance:   Bilateral Distance:    Right Eye Near:   Left Eye Near:    Bilateral Near:     Physical Exam Vitals and nursing note reviewed.  Constitutional:      General: She is not in acute distress.    Appearance: Normal appearance.  HENT:     Head: Normocephalic.     Right Ear: Ear canal and external ear normal.     Left Ear: Ear canal and external ear normal.     Nose: Congestion present.     Right Turbinates: Enlarged and swollen.     Left Turbinates: Enlarged and swollen.     Right Sinus: No maxillary sinus tenderness or frontal sinus tenderness.     Left Sinus: No maxillary sinus tenderness or frontal sinus tenderness.     Mouth/Throat:     Lips: Pink.     Mouth: Mucous membranes are moist.     Pharynx: Uvula midline. Postnasal drip present. No pharyngeal swelling, oropharyngeal exudate, posterior oropharyngeal erythema or uvula swelling.  Eyes:     Extraocular Movements: Extraocular movements intact.     Conjunctiva/sclera: Conjunctivae normal.     Pupils: Pupils are equal, round, and reactive to light.  Cardiovascular:     Rate and Rhythm: Normal rate and regular rhythm.      Pulses: Normal pulses.     Heart sounds: Normal heart sounds.  Pulmonary:     Effort: Pulmonary effort is normal. No respiratory distress.     Breath sounds: Normal breath sounds. No stridor. No wheezing, rhonchi or rales.  Abdominal:     General: Bowel sounds are normal.     Palpations: Abdomen is soft.     Tenderness: There is no abdominal tenderness.  Musculoskeletal:     Cervical back: Normal range of motion.  Lymphadenopathy:     Cervical: No cervical adenopathy.  Neurological:     General: No focal deficit present.     Mental Status: She is alert and oriented to person, place, and time.  Psychiatric:        Mood and Affect: Mood normal.        Behavior: Behavior normal.      UC Treatments / Results  Labs (all  labs ordered are listed, but only abnormal results are displayed) Labs Reviewed - No data to display  EKG   Radiology No results found.  Procedures Procedures (including critical care time)  Medications Ordered in UC Medications - No data to display  Initial Impression / Assessment and Plan / UC Course  I have reviewed the triage vital signs and the nursing notes.  Pertinent labs & imaging results that were available during my care of the patient were reviewed by me and considered in my medical decision making (see chart for details).  On exam, lung sounds are clear throughout, room air sats at 94%.  Patient with moderate bilateral middle ear effusions.  Patient is currently taking amoxicillin 500 mg 3 times daily for a dental infection.  She is also taking cetirizine daily, she does have Flonase at home.  Will start patient on prednisone 40 mg for the next 5 days to help with eustachian tube inflammation and swelling.  Patient advised to begin use of cetirizine and Flonase.  Supportive care recommendations were provided and discussed with the patient to include over-the-counter analgesics, warm compresses to the ear, avoiding sticking anything inside of the  ear, and avoiding sudden movement while dizziness is present.  Patient was advised that if symptoms do not improve, recommend follow-up with her PCP or ENT for further evaluation.  Patient was in agreement with this plan of care and verbalizes understanding.  All questions were answered.  Patient stable for discharge.  Work note was provided.  Final Clinical Impressions(s) / UC Diagnoses   Final diagnoses:  Middle ear effusion, bilateral     Discharge Instructions      Take medication as prescribed.Continue your Cetirizine and Flonase. May take Tylenol as needed for pain, fever, or general discomfort. May use normal saline nasal spray for nasal congestion or runny nose. Do not make any sudden movements while you have symptoms. Do not stick anything inside of the ear while symptoms persist. As discussed if symptoms do not improve, recommend follow-up with your PCP or ENT. Follow-up as needed.      ED Prescriptions     Medication Sig Dispense Auth. Provider   predniSONE (DELTASONE) 20 MG tablet Take 2 tablets (40 mg total) by mouth daily with breakfast for 5 days. 10 tablet Leath-Warren, Sadie Haber, NP      PDMP not reviewed this encounter.   Abran Cantor, NP 12/06/23 [phone]

## 2023-12-06 NOTE — Discharge Instructions (Signed)
Take medication as prescribed.Continue your Cetirizine and Flonase. May take Tylenol as needed for pain, fever, or general discomfort. May use normal saline nasal spray for nasal congestion or runny nose. Do not make any sudden movements while you have symptoms. Do not stick anything inside of the ear while symptoms persist. As discussed if symptoms do not improve, recommend follow-up with your PCP or ENT. Follow-up as needed.

## 2023-12-09 NOTE — Telephone Encounter (Signed)
Availty PA: Certification Number [id number]  Status CANCELLED  Reference Number [id number]  Review Reason 1 Certification Not Required for this Service

## 2023-12-10 ENCOUNTER — Telehealth: Payer: Self-pay | Admitting: *Deleted

## 2023-12-10 NOTE — Telephone Encounter (Signed)
Spoke with patient.   Patient reports she completed Provera BID on 12/05/23. Reports cramping and light flow, denies any heavy bleeding. States she had noticed some change in mood while on Provera, felt "edgy".   Reviewed surgery referral. Confirmed PCP and cardiologist on file. Medical and cardiac clearance requested via faxed and confirmed.   Surgery dates reviewed, patient request to schedule on a Friday. Will proceed with scheduling on 01/24/24, clearance pending.   Patient aware to call if bleeding becomes heavy or new symptoms develop. Will provide update to Dr. Kennith Center and call if any additional recommendations.  Routing to Dr. Kennith Center for review.

## 2023-12-11 ENCOUNTER — Telehealth: Payer: Self-pay | Admitting: *Deleted

## 2023-12-11 ENCOUNTER — Encounter: Payer: Self-pay | Admitting: Obstetrics and Gynecology

## 2023-12-11 ENCOUNTER — Telehealth: Payer: Self-pay | Admitting: Internal Medicine

## 2023-12-11 NOTE — Telephone Encounter (Signed)
   Name: [name]  DOB: [birth year]  MRN: [id number]  Primary Cardiologist: Marjo Bicker, MD   Preoperative team, please contact this patient and set up a phone call appointment for further preoperative risk assessment. Please obtain consent and complete medication review. Thank you for your help.  Procedure scheduled for 01/24/2024  I confirm that guidance regarding antiplatelet and oral anticoagulation therapy has been completed and, if necessary, noted below.  [name] will need to be held 3 days prior to procedure and no other medications indicated to be held.  I also confirmed the patient resides in the state of [place]. As per [facility] Medical Board telemedicine laws, the patient must reside in the state in which the provider is licensed.   Napoleon Form, Leodis Rains, NP 12/11/2023, 8:41 AM [facility]

## 2023-12-11 NOTE — Telephone Encounter (Signed)
  Patient Consent for Virtual Visit        [name] has provided verbal consent on 12/11/2023 for a virtual visit (video or telephone).   CONSENT FOR VIRTUAL VISIT FOR:  [name]  By participating in this virtual visit I agree to the following:  I hereby voluntarily request, consent and authorize [facility] and its employed or contracted physicians, physician assistants, nurse practitioners or other licensed health care professionals (the Practitioner), to provide me with telemedicine health care services (the "Services") as deemed necessary by the treating Practitioner. I acknowledge and consent to receive the Services by the Practitioner via telemedicine. I understand that the telemedicine visit will involve communicating with the Practitioner through live audiovisual communication technology and the disclosure of certain medical information by electronic transmission. I acknowledge that I have been given the opportunity to request an in-person assessment or other available alternative prior to the telemedicine visit and am voluntarily participating in the telemedicine visit.  I understand that I have the right to withhold or withdraw my consent to the use of telemedicine in the course of my care at any time, without affecting my right to future care or treatment, and that the Practitioner or I may terminate the telemedicine visit at any time. I understand that I have the right to inspect all information obtained and/or recorded in the course of the telemedicine visit and may receive copies of available information for a reasonable fee.  I understand that some of the potential risks of receiving the Services via telemedicine include:  Delay or interruption in medical evaluation due to technological equipment failure or disruption; Information transmitted may not be sufficient (e.g. poor resolution of images) to allow for appropriate medical decision making by the  Practitioner; and/or  In rare instances, security protocols could fail, causing a breach of personal health information.  Furthermore, I acknowledge that it is my responsibility to provide information about my medical history, conditions and care that is complete and accurate to the best of my ability. I acknowledge that Practitioner's advice, recommendations, and/or decision may be based on factors not within their control, such as incomplete or inaccurate data provided by me or distortions of diagnostic images or specimens that may result from electronic transmissions. I understand that the practice of medicine is not an exact science and that Practitioner makes no warranties or guarantees regarding treatment outcomes. I acknowledge that a copy of this consent can be made available to me via my patient portal [facility] MyChart), or I can request a printed copy by calling the office of [facility].    I understand that my insurance will be billed for this visit.   I have read or had this consent read to me. I understand the contents of this consent, which adequately explains the benefits and risks of the Services being provided via telemedicine.  I have been provided ample opportunity to ask questions regarding this consent and the Services and have had my questions answered to my satisfaction. I give my informed consent for the services to be provided through the use of telemedicine in my medical care

## 2023-12-11 NOTE — Telephone Encounter (Signed)
Spoke with patient and scheduled her for a preop telehealth visit on 12/31/23. Consent in and meds reviewed. Will route to requesting surgeons office to make them aware.

## 2023-12-11 NOTE — Telephone Encounter (Signed)
   Pre-operative Risk Assessment    Patient Name: [name]  DOB: 05-13-1966 MRN: [id number]      Request for Surgical Clearance    Procedure:   Diagnostic hysteroscopy, dialation and curettage  Date of Surgery:  Clearance 01/24/24                                 Surgeon:  Dr. Vertell Novak Group or Practice Name:  [facility] Phone number:  [phone] Fax number:  [phone]   Type of Clearance Requested:   - Medical  - Pharmacy:  Hold if applicable      Type of Anesthesia:  Local    Additional requests/questions:    SignedSeymour Bars   12/11/2023, 8:24 AM

## 2023-12-12 ENCOUNTER — Other Ambulatory Visit: Payer: Self-pay | Admitting: Obstetrics and Gynecology

## 2023-12-12 DIAGNOSIS — N950 Postmenopausal bleeding: Secondary | ICD-10-CM

## 2023-12-12 MED ORDER — MEDROXYPROGESTERONE ACETATE 10 MG PO TABS: 20.0000 mg | ORAL_TABLET | Freq: Every day | ORAL | 2 refills | Status: DC

## 2023-12-12 MED ORDER — MEDROXYPROGESTERONE ACETATE 10 MG PO TABS: 10.0000 mg | ORAL_TABLET | Freq: Every day | ORAL | 2 refills | Status: DC

## 2023-12-12 NOTE — Telephone Encounter (Signed)
Pt should restart provera 20mg  every day until surgery. Sending rx now. Agree with ED recs for [facility].

## 2023-12-12 NOTE — Telephone Encounter (Signed)
Spoke with patient, advised per Dr. Kennith Center.  Rx sent. Patient states she has virtual appt with Cardiology for surgery clearance on 12/31/23.   Patient verbalizes understanding and is agreeable.   Encounter closed.

## 2023-12-19 NOTE — Patient Instructions (Signed)
[name]  12/19/2023       Your procedure is scheduled on 12/23/23.  Report to [facility] at 7:15 A.M.  Call this number if you have problems the morning of surgery:  [phone]  If you experience any cold or flu symptoms such as cough, fever, chills, shortness of breath, etc. between now and your scheduled surgery, please notify us at the above number.   Remember:   Do not eat or drink after midnight.  You may drink clear liquids until 7:15 am .  Clear liquids allowed are:  Water, Juice (No red color; non-citric and without pulp; diabetics please choose diet or no sugar options), Carbonated beverages (diabetics please choose diet or no sugar options), Clear Tea (No creamer, milk, or cream, including half & half and powdered creamer), Black Coffee Only (No creamer, milk or cream, including half & half and powdered creamer), and Clear Sports drink (No red color; diabetics please choose diet or no sugar options)  PLEASE FOLLOW EGD DIET INSTRUCTIONS GIVEN TO YOU     Take these medicines the morning of surgery with A SIP OF WATER    metoprolol   cetirizine (ZYRTEC)   citalopram (CELEXA)   dexlansoprazole (DEXILANT)   ENTRESTO   HYDROcodone-acetaminophen (NORCO)  if needed  medroxyPROGESTERone    HOLD JARDIANCE 3 DAYS BEFORE PROCEDURE-LAST DOSE ON 12/19/23   HOLD TRULICITY 1 WEEK BEFORE PROCEDURE -LAST DOSE ON 12/15/23     Do not wear jewelry, make-up or nail polish, including gel polish,  artificial nails, or any other type of covering on natural nails (fingers and  toes).  Do not wear lotions, powders, or perfumes, or deodorant.  Do not shave 48 hours prior to surgery.  Men may shave face and neck.  Do not bring valuables to the hospital.  [facility] is not responsible for any belongings or valuables.  Contacts, dentures or bridgework may not be worn into surgery.  Leave your suitcase in the car.  After surgery it may be brought to your room.  For patients admitted  to the hospital, discharge time will be determined by your treatment team.  Patients discharged the day of surgery will not be allowed to drive home.    Please read over the following fact sheets that you were given. Care and Recovery After Surgery   Upper Endoscopy, Adult, Care After After the procedure, it is common to have a sore throat. It is also common to have: Mild stomach pain or discomfort. Bloating. Nausea. Follow these instructions at home: The instructions below may help you care for yourself at home. Your health care provider may give you more instructions. If you have questions, ask your health care provider. If you were given a sedative during the procedure, it can affect you for several hours. Do not drive or operate machinery until your health care provider says that it is safe. If you will be going home right after the procedure, plan to have a responsible adult: Take you home from the hospital or clinic. You will not be allowed to drive. Care for you for the time you are told. Follow instructions from your health care provider about what you may eat and drink. Return to your normal activities as told by your health care provider. Ask your health care provider what activities are safe for you. Take over-the-counter and prescription medicines only as told by your health care provider. Contact a health care provider if you: Have a sore throat  that lasts longer than one day. Have trouble swallowing. Have a fever. Get help right away if you: Vomit blood or your vomit looks like coffee grounds. Have bloody, black, or tarry stools. Have a very bad sore throat or you cannot swallow. Have difficulty breathing or very bad pain in your chest or abdomen. These symptoms may be an emergency. Get help right away. Call 911. Do not wait to see if the symptoms will go away. Do not drive yourself to the hospital. Summary After the procedure, it is common to have a sore throat, mild  stomach discomfort, bloating, and nausea. If you were given a sedative during the procedure, it can affect you for several hours. Do not drive until your health care provider says that it is safe. Follow instructions from your health care provider about what you may eat and drink. Return to your normal activities as told by your health care provider. This information is not intended to replace advice given to you by your health care provider. Make sure you discuss any questions you have with your health care provider. Document Revised: 02/14/2022 Document Reviewed: 02/14/2022 Elsevier Patient Education  2024 [employer]vier Inc[employer]   Monitored Anesthesia Care, Care After The following information offers guidance on how to care for yourself after your procedure. Your health care provider may also give you more specific instructions. If you have problems or questions, contact your health care provider. What can I expect after the procedure? After the procedure, it is common to have: Tiredness. Little or no memory about what happened during or after the procedure. Impaired judgment when it comes to making decisions. Nausea or vomiting. Some trouble with balance. Follow these instructions at home: For the time period you were told by your health care provider:  Rest. Do not participate in activities where you could fall or become injured. Do not drive or use machinery. Do not drink alcohol. Do not take sleeping pills or medicines that cause drowsiness. Do not make important decisions or sign legal documents. Do not take care of children on your own. Medicines Take over-the-counter and prescription medicines only as told by your health care provider. If you were prescribed antibiotics, take them as told by your health care provider. Do not stop using the antibiotic even if you start to feel better. Eating and drinking Follow instructions from your health care provider about what you may eat and  drink. Drink enough fluid to keep your urine pale yellow. If you vomit: Drink clear fluids slowly and in small amounts as you are able. Clear fluids include water, ice chips, low-calorie sports drinks, and fruit juice that has water added to it (diluted fruit juice). Eat light and bland foods in small amounts as you are able. These foods include bananas, applesauce, rice, lean meats, toast, and crackers. General instructions  Have a responsible adult stay with you for the time you are told. It is important to have someone help care for you until you are awake and alert. If you have sleep apnea, surgery and some medicines can increase your risk for breathing problems. Follow instructions from your health care provider about wearing your sleep device: When you are sleeping. This includes during daytime naps. While taking prescription pain medicines, sleeping medicines, or medicines that make you drowsy. Do not use any products that contain nicotine or tobacco. These products include cigarettes, chewing tobacco, and vaping devices, such as e-cigarettes. If you need help quitting, ask your health care provider. Contact a health  care provider if: You feel nauseous or vomit every time you eat or drink. You feel light-headed. You are still sleepy or having trouble with balance after 24 hours. You get a rash. You have a fever. You have redness or swelling around the IV site. Get help right away if: You have trouble breathing. You have new confusion after you get home. These symptoms may be an emergency. Get help right away. Call 911. Do not wait to see if the symptoms will go away. Do not drive yourself to the hospital. This information is not intended to replace advice given to you by your health care provider. Make sure you discuss any questions you have with your health care provider. Document Revised: 04/02/2022 Document Reviewed: 04/02/2022 Elsevier Patient Education  2024 [employer].

## 2023-12-20 ENCOUNTER — Encounter (HOSPITAL_COMMUNITY)
Admission: RE | Admit: 2023-12-20 | Discharge: 2023-12-20 | Disposition: A | Discharge status: Home / Self Care | Payer: Medicaid Other | Source: Ambulatory Visit | Attending: Internal Medicine | Admitting: Internal Medicine

## 2023-12-20 ENCOUNTER — Telehealth: Payer: Self-pay | Admitting: Cardiovascular Disease

## 2023-12-20 ENCOUNTER — Encounter (HOSPITAL_COMMUNITY): Payer: Self-pay

## 2023-12-20 ENCOUNTER — Other Ambulatory Visit (HOSPITAL_COMMUNITY): Payer: Medicaid Other

## 2023-12-20 ENCOUNTER — Encounter: Payer: Self-pay | Admitting: Cardiovascular Disease

## 2023-12-20 VITALS — BP 113/70 | HR 93 | Temp 98.6°F | Resp 18 | Ht 63.0 in | Wt 236.0 lb

## 2023-12-20 DIAGNOSIS — I428 Other cardiomyopathies: Secondary | ICD-10-CM | POA: Insufficient documentation

## 2023-12-20 DIAGNOSIS — Z01812 Encounter for preprocedural laboratory examination: Secondary | ICD-10-CM | POA: Diagnosis not present

## 2023-12-20 DIAGNOSIS — E119 Type 2 diabetes mellitus without complications: Secondary | ICD-10-CM | POA: Insufficient documentation

## 2023-12-20 HISTORY — DX: Presence of cardiac pacemaker: Z95.0

## 2023-12-20 HISTORY — DX: Presence of automatic (implantable) cardiac defibrillator: Z95.810

## 2023-12-20 LAB — BASIC METABOLIC PANEL
Anion gap: 11 (ref 5–15)
BUN: 7 mg/dL (ref 6–20)
CO2: 22 mmol/L (ref 22–32)
Calcium: 9 mg/dL (ref 8.9–10.3)
Chloride: 106 mmol/L (ref 98–111)
Creatinine, Ser: 0.93 mg/dL (ref 0.44–1.00)
GFR, Estimated: >60 mL/min (ref >60)
Glucose, Bld: 126 mg/dL — ABNORMAL HIGH (ref 70–99)
Potassium: 3.5 mmol/L (ref 3.5–5.1)
Sodium: 139 mmol/L (ref 135–145)

## 2023-12-20 NOTE — Progress Notes (Signed)
PERIOPERATIVE PRESCRIPTION FOR IMPLANTED CARDIAC DEVICE PROGRAMMING  Patient Information: Name:  [name]  DOB:  1966/04/29  MRN:  [id number]  Procedure:   EGD    Date of Surgery:  Clearance 12/23/23                                  Surgeon:  Dr. Marletta Lor  Surgeon's Group or Practice Name:  [facility]  Phone number:  [phone]  Fax number:  [phone]   Type of Clearance Requested:     ONLY NEEDS DEVICE CLEARANCE      Type of Anesthesia:   propofol (monitor anesthesia)  Device Information:  Clinic EP Physician:  Dr. York Pellant  Device Type:  Defibrillator Manufacturer and Phone #:  [employer]: [id number] Pacemaker Dependent?:  No. Date of Last Device Check:  10/22/2023 Normal Device Function?:  Yes.    Electrophysiologist's Recommendations:  Have magnet available. Provide continuous ECG monitoring when magnet is used or reprogramming is to be performed.  Procedure may interfere with device function.  Magnet should be placed over device during procedure.  Per Device Clinic Standing Orders, Wiliam Ke, RN  4:07 PM 12/20/2023

## 2023-12-20 NOTE — Telephone Encounter (Signed)
   Pre-operative Risk Assessment    Patient Name: [name]  DOB: 24-Mar-1966 MRN: [id number]   Date of last office visit: 10/10/23 Date of next office visit: 12/31/23 tele visit    Request for Surgical Clearance    Procedure:   EGD   Date of Surgery:  Clearance 12/23/23                                Surgeon:  Dr. Marletta Lor  Surgeon's Group or Practice Name:  [facility]  Phone number:  [phone]  Fax number:  [phone]   Type of Clearance Requested:    ONLY NEEDS DEVICE CLEARANCE     Type of Anesthesia:   propofol (monitor anesthesia)    Additional requests/questions:    Alben Spittle   12/20/2023, 3:47 PM

## 2023-12-21 ENCOUNTER — Other Ambulatory Visit: Payer: Self-pay | Admitting: Internal Medicine

## 2023-12-21 DIAGNOSIS — Z419 Encounter for procedure for purposes other than remedying health state, unspecified: Secondary | ICD-10-CM | POA: Diagnosis not present

## 2023-12-22 ENCOUNTER — Other Ambulatory Visit: Payer: Self-pay | Admitting: Family Medicine

## 2023-12-23 ENCOUNTER — Ambulatory Visit (HOSPITAL_COMMUNITY): Payer: Medicaid Other | Admitting: Certified Registered Nurse Anesthetist

## 2023-12-23 ENCOUNTER — Other Ambulatory Visit: Payer: Self-pay

## 2023-12-23 ENCOUNTER — Ambulatory Visit (HOSPITAL_COMMUNITY)
Admission: RE | Admit: 2023-12-23 | Discharge: 2023-12-23 | Disposition: A | Discharge status: Home / Self Care | Payer: Medicaid Other | Attending: Internal Medicine | Admitting: Internal Medicine

## 2023-12-23 ENCOUNTER — Encounter (HOSPITAL_COMMUNITY)
Admission: RE | Disposition: A | Discharge status: Home / Self Care | Payer: Self-pay | Source: Home / Self Care | Attending: Internal Medicine

## 2023-12-23 DIAGNOSIS — I110 Hypertensive heart disease with heart failure: Secondary | ICD-10-CM

## 2023-12-23 DIAGNOSIS — Z6841 Body Mass Index (BMI) 40.0 and over, adult: Secondary | ICD-10-CM | POA: Diagnosis not present

## 2023-12-23 DIAGNOSIS — F32A Depression, unspecified: Secondary | ICD-10-CM | POA: Diagnosis not present

## 2023-12-23 DIAGNOSIS — Z7984 Long term (current) use of oral hypoglycemic drugs: Secondary | ICD-10-CM | POA: Diagnosis not present

## 2023-12-23 DIAGNOSIS — I2510 Atherosclerotic heart disease of native coronary artery without angina pectoris: Secondary | ICD-10-CM | POA: Insufficient documentation

## 2023-12-23 DIAGNOSIS — Z95810 Presence of automatic (implantable) cardiac defibrillator: Secondary | ICD-10-CM | POA: Insufficient documentation

## 2023-12-23 DIAGNOSIS — I472 Ventricular tachycardia, unspecified: Secondary | ICD-10-CM | POA: Diagnosis not present

## 2023-12-23 DIAGNOSIS — K219 Gastro-esophageal reflux disease without esophagitis: Secondary | ICD-10-CM | POA: Diagnosis not present

## 2023-12-23 DIAGNOSIS — K259 Gastric ulcer, unspecified as acute or chronic, without hemorrhage or perforation: Secondary | ICD-10-CM

## 2023-12-23 DIAGNOSIS — Z7985 Long-term (current) use of injectable non-insulin antidiabetic drugs: Secondary | ICD-10-CM | POA: Diagnosis not present

## 2023-12-23 DIAGNOSIS — I509 Heart failure, unspecified: Secondary | ICD-10-CM | POA: Insufficient documentation

## 2023-12-23 DIAGNOSIS — D649 Anemia, unspecified: Secondary | ICD-10-CM | POA: Insufficient documentation

## 2023-12-23 DIAGNOSIS — I428 Other cardiomyopathies: Secondary | ICD-10-CM | POA: Insufficient documentation

## 2023-12-23 DIAGNOSIS — I447 Left bundle-branch block, unspecified: Secondary | ICD-10-CM | POA: Insufficient documentation

## 2023-12-23 DIAGNOSIS — Z800 Family history of malignant neoplasm of digestive organs: Secondary | ICD-10-CM | POA: Diagnosis not present

## 2023-12-23 DIAGNOSIS — E119 Type 2 diabetes mellitus without complications: Secondary | ICD-10-CM | POA: Insufficient documentation

## 2023-12-23 DIAGNOSIS — K317 Polyp of stomach and duodenum: Secondary | ICD-10-CM | POA: Insufficient documentation

## 2023-12-23 DIAGNOSIS — R0602 Shortness of breath: Secondary | ICD-10-CM | POA: Insufficient documentation

## 2023-12-23 DIAGNOSIS — Z79899 Other long term (current) drug therapy: Secondary | ICD-10-CM | POA: Diagnosis not present

## 2023-12-23 DIAGNOSIS — K2970 Gastritis, unspecified, without bleeding: Secondary | ICD-10-CM | POA: Diagnosis not present

## 2023-12-23 DIAGNOSIS — M1990 Unspecified osteoarthritis, unspecified site: Secondary | ICD-10-CM | POA: Diagnosis not present

## 2023-12-23 DIAGNOSIS — K2950 Unspecified chronic gastritis without bleeding: Secondary | ICD-10-CM

## 2023-12-23 DIAGNOSIS — F419 Anxiety disorder, unspecified: Secondary | ICD-10-CM | POA: Diagnosis not present

## 2023-12-23 DIAGNOSIS — K30 Functional dyspepsia: Secondary | ICD-10-CM | POA: Diagnosis not present

## 2023-12-23 DIAGNOSIS — Z833 Family history of diabetes mellitus: Secondary | ICD-10-CM | POA: Insufficient documentation

## 2023-12-23 DIAGNOSIS — Z860101 Personal history of adenomatous and serrated colon polyps: Secondary | ICD-10-CM | POA: Insufficient documentation

## 2023-12-23 HISTORY — PX: POLYPECTOMY: SHX5525

## 2023-12-23 HISTORY — PX: BIOPSY: SHX5522

## 2023-12-23 HISTORY — PX: ESOPHAGOGASTRODUODENOSCOPY (EGD) WITH PROPOFOL: SHX5813

## 2023-12-23 LAB — GLUCOSE, CAPILLARY: Glucose-Capillary: 128 mg/dL — ABNORMAL HIGH (ref 70–99)

## 2023-12-23 SURGERY — ESOPHAGOGASTRODUODENOSCOPY (EGD) WITH PROPOFOL
Anesthesia: General

## 2023-12-23 MED ORDER — PROPOFOL 10 MG/ML IV BOLUS
INTRAVENOUS | Status: DC | PRN
  Administered 2023-12-23 (×2): 50 mg via INTRAVENOUS

## 2023-12-23 MED ORDER — LIDOCAINE HCL (CARDIAC) PF 100 MG/5ML IV SOSY
PREFILLED_SYRINGE | INTRAVENOUS | Status: DC | PRN
  Administered 2023-12-23: 60 mg via INTRATRACHEAL

## 2023-12-23 MED ORDER — LIDOCAINE HCL (PF) 2 % IJ SOLN
INTRAMUSCULAR | Status: AC
  Filled 2023-12-23: qty 40

## 2023-12-23 MED ORDER — ONDANSETRON 8 MG PO TBDP: ORAL_TABLET | ORAL | 0 refills | Status: DC

## 2023-12-23 MED ORDER — LACTATED RINGERS IV SOLN: INTRAVENOUS | Status: DC | PRN

## 2023-12-23 MED ORDER — PROPOFOL 500 MG/50ML IV EMUL
INTRAVENOUS | Status: DC | PRN
  Administered 2023-12-23: 200 ug/kg/min via INTRAVENOUS

## 2023-12-23 NOTE — Discharge Instructions (Addendum)
EGD Discharge instructions Please read the instructions outlined below and refer to this sheet in the next few weeks. These discharge instructions provide you with general information on caring for yourself after you leave the hospital. Your doctor may also give you specific instructions. While your treatment has been planned according to the most current medical practices available, unavoidable complications occasionally occur. If you have any problems or questions after discharge, please call your doctor. ACTIVITY You may resume your regular activity but move at a slower pace for the next 24 hours.  Take frequent rest periods for the next 24 hours.  Walking will help expel (get rid of) the air and reduce the bloated feeling in your abdomen.  No driving for 24 hours (because of the anesthesia (medicine) used during the test).  You may shower.  Do not sign any important legal documents or operate any machinery for 24 hours (because of the anesthesia used during the test).  NUTRITION Drink plenty of fluids.  You may resume your normal diet.  Begin with a light meal and progress to your normal diet.  Avoid alcoholic beverages for 24 hours or as instructed by your caregiver.  MEDICATIONS You may resume your normal medications unless your caregiver tells you otherwise.  WHAT YOU CAN EXPECT TODAY You may experience abdominal discomfort such as a feeling of fullness or "gas" pains.  FOLLOW-UP Your doctor will discuss the results of your test with you.  SEEK IMMEDIATE MEDICAL ATTENTION IF ANY OF THE FOLLOWING OCCUR: Excessive nausea (feeling sick to your stomach) and/or vomiting.  Severe abdominal pain and distention (swelling).  Trouble swallowing.  Temperature over 101 F (37.8 C).  Rectal bleeding or vomiting of blood.   Your EGD revealed mild amount inflammation in your stomach.  I took biopsies of this to rule out infection with a bacteria called H. pylori.  Multiple small likely benign  fundic gland polyps in her stomach.  One of the polyps slightly ulcerated which I removed today.  Esophagus and small bowel appeared normal.  Await pathology results, my office will contact you.  Continue on Dexilant daily.  Follow-up as previously scheduled.  I hope you have a great rest of your week!  Hennie Duos. Marletta Lor, D.O. Gastroenterology and Hepatology [facility]

## 2023-12-23 NOTE — Transfer of Care (Signed)
Immediate Anesthesia Transfer of Care Note  Patient: [name]  Procedure(s) Performed: ESOPHAGOGASTRODUODENOSCOPY (EGD) WITH PROPOFOL BIOPSY POLYPECTOMY  Patient Location: Endoscopy Unit  Anesthesia Type:General  Level of Consciousness: awake  Airway & Oxygen Therapy: Patient Spontanous Breathing  Post-op Assessment: Report given to RN and Post -op Vital signs reviewed and stable  Post vital signs: Reviewed and stable  Last Vitals:  Vitals Value Taken Time  BP 135/80   Temp 98   Pulse 85   Resp 16   SpO2 95     Last Pain:  Vitals:   12/23/23 1043  PainSc: 0-No pain         Complications: No notable events documented.

## 2023-12-23 NOTE — Anesthesia Preprocedure Evaluation (Signed)
Anesthesia Evaluation  Patient identified by MRN, date of birth, ID band Patient awake    Reviewed: Allergy & Precautions, H&P , NPO status , Patient's Chart, lab work & pertinent test results, reviewed documented beta blocker date and time   History of Anesthesia Complications (+) history of anesthetic complications  Airway Mallampati: II  TM Distance: >3 FB Neck ROM: full    Dental no notable dental hx. (+) Teeth Intact, Dental Advisory Given   Pulmonary shortness of breath   Pulmonary exam normal breath sounds clear to auscultation       Cardiovascular Exercise Tolerance: Good hypertension, + CAD and +CHF  Normal cardiovascular exam+ dysrhythmias Supra Ventricular Tachycardia + pacemaker + Cardiac Defibrillator  Rhythm:regular Rate:Normal  Non-ischemic cardiomyopathy.  EF was 30 - 35% but has improved to 40 - 45 % now. LBBB.  Mild CAD.  syncope   Neuro/Psych  PSYCHIATRIC DISORDERS Anxiety Depression     Neuromuscular disease    GI/Hepatic Neg liver ROS,GERD  Medicated,,  Endo/Other  diabetes, Well Controlled, Type 2  Class 3 obesity  Renal/GU negative Renal ROS  negative genitourinary   Musculoskeletal  (+) Arthritis , Osteoarthritis,    Abdominal   Peds  Hematology  (+) Blood dyscrasia, anemia   Anesthesia Other Findings   Reproductive/Obstetrics negative OB ROS                             Anesthesia Physical Anesthesia Plan  ASA: 3  Anesthesia Plan: General   Post-op Pain Management: Minimal or no pain anticipated   Induction: Intravenous  PONV Risk Score and Plan: Propofol infusion  Airway Management Planned: Natural Airway and Nasal Cannula  Additional Equipment: None  Intra-op Plan:   Post-operative Plan:   Informed Consent: I have reviewed the patients History and Physical, chart, labs and discussed the procedure including the risks, benefits and alternatives for  the proposed anesthesia with the patient or authorized representative who has indicated his/her understanding and acceptance.     Dental Advisory Given  Plan Discussed with: CRNA  Anesthesia Plan Comments:         Anesthesia Quick Evaluation

## 2023-12-23 NOTE — Interval H&P Note (Signed)
History and Physical Interval Note:  12/23/2023 10:04 AM  [name]  has presented today for surgery, with the diagnosis of dyspepsia.  The various methods of treatment have been discussed with the patient and family. After consideration of risks, benefits and other options for treatment, the patient has consented to  Procedure(s) with comments: ESOPHAGOGASTRODUODENOSCOPY (EGD) WITH PROPOFOL (N/A) - 11:15 am, asa 3, pt knows to arrive at 6:15 as a surgical intervention.  The patient's history has been reviewed, patient examined, no change in status, stable for surgery.  I have reviewed the patient's chart and labs.  Questions were answered to the patient's satisfaction.     Lanelle Bal

## 2023-12-23 NOTE — Anesthesia Postprocedure Evaluation (Signed)
Anesthesia Post Note  Patient: [name]  Procedure(s) Performed: ESOPHAGOGASTRODUODENOSCOPY (EGD) WITH PROPOFOL BIOPSY POLYPECTOMY  Patient location during evaluation: PACU Anesthesia Type: General Level of consciousness: awake and alert Pain management: pain level controlled Vital Signs Assessment: post-procedure vital signs reviewed and stable Respiratory status: spontaneous breathing, nonlabored ventilation, respiratory function stable and patient connected to nasal cannula oxygen Cardiovascular status: blood pressure returned to baseline and stable Postop Assessment: no apparent nausea or vomiting Anesthetic complications: no   There were no known notable events for this encounter.   Last Vitals:  Vitals:   12/23/23 0915 12/23/23 1056  BP: (!) 153/91 115/68  Pulse: 83 81  Resp: 14 11  Temp: 36.8 C 36.7 C  SpO2: 97% 96%    Last Pain:  Vitals:   12/23/23 1056  TempSrc: Axillary  PainSc: 0-No pain                 Kiele Heavrin L Tryce Surratt

## 2023-12-23 NOTE — Op Note (Signed)
[facility] Patient Name: [name] Procedure Date: 12/23/2023 10:31 AM MRN: [id number] Date of Birth: [birth year] Attending MD: Hennie Duos. Marletta Lor , [place], [id number] CSN: [id number] Age: 58 Admit Type: Outpatient Procedure:                Upper GI endoscopy Indications:              Functional Dyspepsia Providers:                Hennie Duos. Marletta Lor, DO, Sheran Fava, Elinor Parkinson Referring MD:              Medicines:                See the Anesthesia note for documentation of the                            administered medications Complications:            No immediate complications. Estimated Blood Loss:     Estimated blood loss was minimal. Procedure:                Pre-Anesthesia Assessment:                           - The anesthesia plan was to use monitored                            anesthesia care (MAC).                           After obtaining informed consent, the endoscope was                            passed under direct vision. Throughout the                            procedure, the patient's blood pressure, pulse, and                            oxygen saturations were monitored continuously. The                            [id number]-H190 ([id number]) scope was introduced through the                            mouth, and advanced to the second part of duodenum.                            The upper GI endoscopy was accomplished without                            difficulty. The patient tolerated the procedure                            well. Scope In: 10:48:08 AM Scope Out:  10:54:39 AM Total Procedure Duration: 0 hours 6 minutes 31 seconds  Findings:      The Z-line was regular and was found 38 cm from the incisors.      Diffuse mild inflammation characterized by erythema was found in the       entire examined stomach. Biopsies were taken with a cold forceps for       Helicobacter pylori testing.      A single 6 mm sessile polyp with no  bleeding and no stigmata of recent       bleeding was found in the gastric body. Inflamed and slightly ulcerated       The polyp was removed with a cold snare. Resection and retrieval were       complete.      Multiple small fundic gland polyps with no bleeding and no stigmata of       recent bleeding were found in the gastric fundus and in the gastric body       (all <1 cm in size).      The duodenal bulb, first portion of the duodenum and second portion of       the duodenum were normal. Impression:               - Z-line regular, 38 cm from the incisors.                           - Gastritis. Biopsied.                           - A single gastric polyp. Resected and retrieved.                           - Multiple gastric polyps.                           - Normal duodenal bulb, first portion of the                            duodenum and second portion of the duodenum. Moderate Sedation:      Per Anesthesia Care Recommendation:           - Patient has a contact number available for                            emergencies. The signs and symptoms of potential                            delayed complications were discussed with the                            patient. Return to normal activities tomorrow.                            Written discharge instructions were provided to the                            patient.                           -  Resume previous diet.                           - Continue present medications.                           - Await pathology results.                           - Return to GI clinic as previously scheduled. Procedure Code(s):        --- Professional ---                           [phone], Esophagogastroduodenoscopy, flexible,                            transoral; with removal of tumor(s), polyp(s), or                            other lesion(s) by snare technique                           43239, 59, Esophagogastroduodenoscopy, flexible,                             transoral; with biopsy, single or multiple Diagnosis Code(s):        --- Professional ---                           K29.70, Gastritis, unspecified, without bleeding                           K31.7, Polyp of stomach and duodenum                           K30, Functional dyspepsia CPT copyright 2022 American Medical Association. All rights reserved. The codes documented in this report are preliminary and upon coder review may  be revised to meet current compliance requirements. Hennie Duos. Marletta Lor, DO Hennie Duos. Tiana Sivertson, DO 12/23/2023 10:58:00 AM This report has been signed electronically. Number of Addenda: 0

## 2023-12-24 ENCOUNTER — Encounter (HOSPITAL_COMMUNITY): Payer: Self-pay | Admitting: Internal Medicine

## 2023-12-25 LAB — SURGICAL PATHOLOGY

## 2023-12-31 ENCOUNTER — Ambulatory Visit: Payer: Medicaid Other | Attending: Nurse Practitioner | Admitting: Nurse Practitioner

## 2023-12-31 DIAGNOSIS — Z01810 Encounter for preprocedural cardiovascular examination: Secondary | ICD-10-CM

## 2023-12-31 MED ORDER — EMPAGLIFLOZIN 10 MG PO TABS: 10.0000 mg | ORAL_TABLET | Freq: Every day | ORAL | 0 refills | Status: DC

## 2023-12-31 NOTE — Progress Notes (Signed)
Virtual Visit via Telephone Note   Because of [name]'s co-morbid illnesses, she is at least at moderate risk for complications without adequate follow up.  This format is felt to be most appropriate for this patient at this time.  The patient did not have access to video technology/had technical difficulties with video requiring transitioning to audio format only (telephone).  All issues noted in this document were discussed and addressed.  No physical exam could be performed with this format.  Please refer to the patient's chart for her consent to telehealth for [name].  Evaluation Performed:  Preoperative cardiovascular risk assessment _____________   Date:  12/31/2023   Patient ID:  [name], DOB [birth year], MRN [id number] Patient Location:  Home Provider location:   Office  Primary Care Provider:  Tommie Sams, DO Primary Cardiologist:  Marjo Bicker, MD  Chief Complaint / Patient Profile   58 y.o. y/o female with a h/o chronic HFrEF, NICM, LBBB s/p ICD implant, breast cancer who is pending diagnostic hysteroscopy, dilatation and curettage on 01/24/24 with Dr. Kennith Center and presents today for telephonic preoperative cardiovascular risk assessment.  History of Present Illness    [name] is a 58 y.o. female who presents via audio/video conferencing for a telehealth visit today.  Pt was last seen in cardiology clinic on 10/10/23 by Otilio Saber, PA.  At that time [name] was doing well.  The patient is now pending procedure as outlined above. Since her last visit, she denies chest pain, lower extremity edema, fatigue, palpitations, diaphoresis, weakness, presyncope, syncope, orthopnea, and PND. She has some shortness of breath with  moderate exertion that is unchanged recently. 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    Past Medical History     Past Medical History:  Diagnosis Date   AICD (automatic cardioverter/defibrillator) present    Anxiety    Arthritis    Blood transfusion without reported diagnosis 1999   due to heavy menses   Breast cancer (HCC) 05/22/2011   03/01/11, Stage 2, s/p lumpectomy, chemo/xrt   CHF (congestive heart failure) (HCC)    Complication of anesthesia    pt woke up during procedure   DDD (degenerative disc disease), lumbar    Depression 06/22/2011   Diverticulitis    DM (diabetes mellitus) (HCC) 06/22/2011   Genital warts    GERD (gastroesophageal reflux disease) 06/22/2011   Hx of adenomatous colonic polyps 10/2007   4mm sigmoid tubular adenoma, FH colon cancer, mother in mid-50s   Hypercholesterolemia 06/22/2011   Hypertension 06/22/2011   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   Iron deficiency anemia 03/25/2015   LBBB (left bundle branch block)    Mild CAD    Morbid obesity (HCC)    NICM (nonischemic cardiomyopathy) (HCC)    Personal history of chemotherapy    Personal history of radiation therapy    Presence of permanent cardiac pacemaker    PSVT (paroxysmal supraventricular tachycardia) (HCC)    Shortness of breath    STD (sexually transmitted disease)    HPV, 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   Syncope    Syncope    Vaginal bleeding 03/08/2014   Past Surgical History:  Procedure Laterality Date   back surg x2     BALLOON DILATION N/A 09/22/2021   Procedure: BALLOON DILATION;  Surgeon: Lanelle Bal, DO;  Location: [facility];  Service: Endoscopy;  Laterality: N/A;   BIOPSY  09/22/2021   Procedure: BIOPSY;  Surgeon: Lanelle Bal, DO;  [facility];  Service: Endoscopy;;   BIOPSY  12/23/2023   Procedure: BIOPSY;  Surgeon: Lanelle Bal, DO;  [facility]: [facility];  Service: Endoscopy;;   BIV ICD INSERTION CRT-D N/A 04/23/2023   Procedure: BIV ICD INSERTION CRT-D;  Surgeon: Maurice Small, MD;  Location: [facility] INVASIVE CV LAB;  Service:  Cardiovascular;  Laterality: N/A;   BREAST LUMPECTOMY Right 2012   CHOLECYSTECTOMY     COLONOSCOPY  11/03/07   4-mm sessile polyp removed/small internal hemorrhoids/tubular adenoma, random colon bx negative for microscopic colitis   COLONOSCOPY WITH PROPOFOL N/A 09/17/2016   Grade 2 hemorrhoids, colonic diverticulosis, ascending colon polyp (sessile serrated adenoma), 5 year surveillance   COLONOSCOPY WITH PROPOFOL N/A 09/22/2021   Procedure: COLONOSCOPY WITH PROPOFOL;  Surgeon: Lanelle Bal, DO;  [facility]: [facility];  Service: Endoscopy;  Laterality: N/A;  7:30am   COLONOSCOPY, ESOPHAGOGASTRODUODENOSCOPY (EGD) AND ESOPHAGEAL DILATION  01/2012   mild gastritis s/p biopsy. Empiric dilation. Normal duodenum.    DILATATION & CURETTAGE/HYSTEROSCOPY WITH MYOSURE N/A 02/10/2015   Procedure: DILATATION & CURETTAGE/HYSTEROSCOPY WITH MYOSURE;  Surgeon: Patton Salles, MD;  Location: [facility];  Service: Gynecology;  Laterality: N/A;   ESOPHAGOGASTRODUODENOSCOPY (EGD) WITH PROPOFOL N/A 01/11/2020   Normal esophagus, s/p dilation, normal stomach, normal duodenum.    ESOPHAGOGASTRODUODENOSCOPY (EGD) WITH PROPOFOL N/A 09/22/2021   Procedure: ESOPHAGOGASTRODUODENOSCOPY (EGD) WITH PROPOFOL;  Surgeon: Lanelle Bal, DO;  [facility];  Service: Endoscopy;  Laterality: N/A;   ESOPHAGOGASTRODUODENOSCOPY (EGD) WITH PROPOFOL N/A 12/23/2023   Procedure: ESOPHAGOGASTRODUODENOSCOPY (EGD) WITH PROPOFOL;  Surgeon: Lanelle Bal, DO;  [facility]: [facility];  Service: Endoscopy;  Laterality: N/A;  11:15 am, asa 3, pt knows to arrive at 6:15   MALONEY DILATION N/A 01/11/2020   Procedure: Elease Hashimoto DILATION;  Surgeon: Corbin Ade, MD;  [facility]: [facility];  Service: Endoscopy;  Laterality: N/A;   MM BREAST STEREO BX*L*R/S     rt.   POLYPECTOMY  09/17/2016   Procedure: POLYPECTOMY;  Surgeon: Corbin Ade, MD;  [facility];  Service: Endoscopy;;  ascending colon    POLYPECTOMY  09/22/2021   Procedure: POLYPECTOMY;  Surgeon: Lanelle Bal, DO;  [facility];  Service: Endoscopy;;   POLYPECTOMY  12/23/2023   Procedure: POLYPECTOMY;  Surgeon: Lanelle Bal, DO;  [facility];  Service: Endoscopy;;   PORT-A-CATH REMOVAL  05/26/2012   Procedure: REMOVAL PORT-A-CATH;  Surgeon: [name], MD;  Location: [facility];  Service: General;  Laterality: N/A;  Minor Room   PORTACATH PLACEMENT      Allergies  Allergies  Allergen Reactions   Flagyl [Metronidazole] Hives, Itching and Swelling   Lansoprazole Other (See Comments)    Stomach pain   Pravastatin Other (See Comments)    Muscle aches    Home Medications    Prior to Admission medications   Medication Sig Start Date End Date Taking? Authorizing Provider  atorvastatin (LIPITOR) 40 MG tablet TAKE 1 TABLET BY MOUTH AT BEDTIME 10/16/23   Cook, [place] G, DO  blood glucose meter kit and supplies Dispense based on patient and insurance preference. Use up to four times daily as directed. (FOR ICD-10 E10.9, E11.9). 01/05/20   Merlyn Albert, MD  Blood Glucose Monitoring Suppl DEVI 1 each by Does  not apply route 2 (two) times daily. May substitute to any manufacturer covered by patient's insurance. 07/18/23   Campbell Riches, NP  Blood Pressure KIT 1 Units by Does not apply route daily. 02/28/23   Mallipeddi, Vishnu P, MD  cetirizine (ZYRTEC) 10 MG tablet Take 1 tablet by mouth once daily 09/30/23   Cook, [place] G, DO  citalopram (CELEXA) 40 MG tablet TAKE 1 TABLET BY MOUTH EVERY DAY 05/08/23   Tommie Sams, DO  dexlansoprazole (DEXILANT) 60 MG capsule Take 1 capsule (60 mg total) by mouth daily. 12/05/23   Gelene Mink, NP  Dulaglutide (TRULICITY) 0.75 MG/0.5ML SOPN INJECT 0.75 MG SUBCUTANEOUSLY ONCE A WEEK 04/29/23   Everlene Other G, DO  DULoxetine (CYMBALTA) 30 MG capsule Take 1 capsule (30 mg total) by mouth daily. 09/06/23   Lovorn, Aundra Millet, MD  empagliflozin (JARDIANCE) 10 MG TABS  tablet TAKE 1 TABLET BY MOUTH ONCE DAILY BEFORE BREAKFAST 12/23/23   Mallipeddi, Orion Modest, MD  ENTRESTO 24-26 MG TAKE 1 TABLET BY MOUTH TWICE A DAY 06/10/23   Jake Bathe, MD  fluticasone (FLONASE) 50 MCG/ACT nasal spray USE 2 SPRAY(S) IN EACH NOSTRIL ONCE DAILY AS NEEDED 10/16/23   Tommie Sams, DO  HYDROcodone-acetaminophen (NORCO) 5-325 MG tablet Take 1 tablet by mouth every 6 (six) hours as needed for moderate pain (pain score 4-6). Do Not Fill Before  02/ 14/2025 11/14/23   Jones Bales, NP  medroxyPROGESTERone (PROVERA) 10 MG tablet Take 2 tablets (20 mg total) by mouth daily. 12/12/23   Rosalyn Gess, MD  metFORMIN (GLUCOPHAGE-XR) 750 MG 24 hr tablet Take 1 tablet by mouth once daily with breakfast 10/16/23   Everlene Other G, DO  metoprolol succinate (TOPROL-XL) 50 MG 24 hr tablet Take 1 tablet by mouth once daily 12/02/23   Mallipeddi, Vishnu P, MD  ondansetron (ZOFRAN-ODT) 4 MG disintegrating tablet Take 1 tablet (4 mg total) by mouth every 8 (eight) hours as needed for nausea or vomiting. 10/21/23   Everlene Other G, DO  ondansetron (ZOFRAN-ODT) 8 MG disintegrating tablet DISSOLVE 1 TABLET IN MOUTH EVERY 8 HOURS AS NEEDED FOR NAUSEA OR VOMITING 12/23/23   Tommie Sams, DO  Vitamin D, Ergocalciferol, (DRISDOL) 1.25 MG (50000 UNIT) CAPS capsule TAKE 1 CAPSULE BY MOUTH ONE TIME PER WEEK 04/30/23   Doreatha Massed, MD    Physical Exam    Vital Signs:  Rosezena Sensor does not have vital signs available for review today.  Given telephonic nature of communication, physical exam is limited. AAOx3. NAD. Normal affect.  Speech and respirations are unlabored.  Accessory Clinical Findings    None  Assessment & Plan    1.  Preoperative Cardiovascular Risk Assessment: According to the Revised Cardiac Risk Index (RCRI), her Perioperative Risk of Major Cardiac Event is (%): 0.9. Her Functional Capacity in METs is: 5.04 according to the Duke Activity Status Index (DASI). The patient is doing  well from a cardiac perspective. Therefore, based on ACC/AHA guidelines, the patient would be at acceptable risk for the planned procedure without further cardiovascular testing.   The patient was advised that if she develops new symptoms prior to surgery to contact our office to arrange for a follow-up visit, and she verbalized understanding.  Per office protocol, she may hold Jardiance for 3 days prior to procedure and should resume as soon as hemodynamically stable postoperatively.  A copy of this note will be routed to requesting surgeon.  Time:   Today,  I have spent 10 minutes with the patient with telehealth technology discussing medical history, symptoms, and management plan.     Levi Aland, NP-C  12/31/2023, 2:06 PM [address] Office [phone] Fax [phone]

## 2024-01-01 ENCOUNTER — Other Ambulatory Visit (HOSPITAL_COMMUNITY): Payer: Self-pay

## 2024-01-02 ENCOUNTER — Encounter: Payer: Self-pay | Admitting: Nurse Practitioner

## 2024-01-02 ENCOUNTER — Ambulatory Visit: Payer: Medicaid Other | Admitting: Nurse Practitioner

## 2024-01-02 VITALS — BP 114/78 | HR 78 | Temp 97.4°F | Wt 239.0 lb

## 2024-01-02 DIAGNOSIS — Z79899 Other long term (current) drug therapy: Secondary | ICD-10-CM

## 2024-01-02 DIAGNOSIS — Z01818 Encounter for other preprocedural examination: Secondary | ICD-10-CM | POA: Diagnosis not present

## 2024-01-02 DIAGNOSIS — E119 Type 2 diabetes mellitus without complications: Secondary | ICD-10-CM

## 2024-01-02 DIAGNOSIS — Z7985 Long-term (current) use of injectable non-insulin antidiabetic drugs: Secondary | ICD-10-CM | POA: Diagnosis not present

## 2024-01-02 NOTE — Telephone Encounter (Unsigned)
Copied from CRM [id number]. Topic: Clinical - Medical Advice >> Jan 02, 2024  8:58 AM Gildardo Pounds wrote: Reason for CRM: Noreene Larsson with [facility] following up on surgical clearance request. Patient scheduled 01/24/2024, and haven't received clearance back yet. Form is not showing received. Request was resent today. Callback number is [phone] option 5

## 2024-01-02 NOTE — Patient Instructions (Signed)
Epley maneuver for the left ear (YouTube videos); have someone with you

## 2024-01-03 ENCOUNTER — Encounter: Payer: Self-pay | Admitting: Nurse Practitioner

## 2024-01-03 LAB — HEPATIC FUNCTION PANEL
ALT: 13 [IU]/L (ref 0–32)
AST: 13 [IU]/L (ref 0–40)
Albumin: 4.4 g/dL (ref 3.8–4.9)
Alkaline Phosphatase: 71 [IU]/L (ref 44–121)
Bilirubin Total: 0.3 mg/dL (ref 0.0–1.2)
Bilirubin, Direct: 0.11 mg/dL (ref 0.00–0.40)
Total Protein: 7.3 g/dL (ref 6.0–8.5)

## 2024-01-03 LAB — MICROALBUMIN / CREATININE URINE RATIO
Creatinine, Urine: 75.4 mg/dL
Microalb/Creat Ratio: 37 mg/g{creat} — ABNORMAL HIGH (ref 0–29)
Microalbumin, Urine: 28.2 ug/mL

## 2024-01-03 LAB — HEMOGLOBIN A1C
Est. average glucose Bld gHb Est-mCnc: 137 mg/dL
Hgb A1c MFr Bld: 6.4 % — ABNORMAL HIGH (ref 4.8–5.6)

## 2024-01-04 ENCOUNTER — Other Ambulatory Visit: Payer: Self-pay | Admitting: Family Medicine

## 2024-01-04 ENCOUNTER — Encounter: Payer: Self-pay | Admitting: Nurse Practitioner

## 2024-01-04 DIAGNOSIS — J302 Other seasonal allergic rhinitis: Secondary | ICD-10-CM

## 2024-01-04 NOTE — Progress Notes (Unsigned)
   Subjective:    Patient ID: [name], female    DOB: 04-07-1966, 57 y.o.   MRN: [id number]  HPI Presents for preop evaluation for upcoming GYN procedure.  Patient has had clearance from cardiology, see notes.  Had to stop her Trulicity over a week ago for her EGD.  Decided to go ahead and stay off medications and she has to stop but a week before any surgery.  This means she will be off of it for 2 weeks.  Once she has recuperated from her procedure, patient to start back on Trulicity 0.75 mg weekly.   Review of Systems  Constitutional:  Positive for fatigue.  Respiratory:  Negative for cough, chest tightness, shortness of breath and wheezing.        Mild leg swelling at times.  Cardiovascular:  Positive for leg swelling. Negative for chest pain.   Social History   Tobacco Use   Smoking status: Never   Smokeless tobacco: Never  Vaping Use   Vaping status: Never Used  Substance Use Topics   Alcohol use: No    Alcohol/week: 0.0 standard drinks of alcohol   Drug use: No        Objective:   Physical Exam Vitals and nursing note reviewed.  Constitutional:      General: She is not in acute distress. Cardiovascular:     Rate and Rhythm: Normal rate and regular rhythm.     Pulses: Normal pulses.     Heart sounds: No murmur heard. Pulmonary:     Effort: Pulmonary effort is normal.     Breath sounds: Normal breath sounds.  Neurological:     Mental Status: She is alert.  Psychiatric:        Mood and Affect: Mood normal.        Behavior: Behavior normal.        Thought Content: Thought content normal.        Judgment: Judgment normal.    Diabetic Foot Exam - Simple   Simple Foot Form  01/02/2024  3:30 PM  Visual Inspection No deformities, no ulcerations, no other skin breakdown bilaterally: Yes Sensation Testing Intact to touch and monofilament testing bilaterally: Yes Pulse Check See comments: Yes Comments Strong DP pulses bilaterally.  Toes cool but with  normal capillary refill.  Sensation mildly diminished on the middle toes of her left foot which she relates to her low back issues.  Exam otherwise normal.    Today's Vitals   01/02/24 1608  BP: 114/78  Pulse: 78  Temp: (!) 97.4 F (36.3 C)  Weight: 239 lb (108.4 kg)   Body mass index is 42.34 kg/m.         Assessment & Plan:   Problem List Items Addressed This Visit       Endocrine   Diabetes mellitus without complication (HCC)   Relevant Orders   Hepatic function panel (Completed)   Hemoglobin A1c (Completed)   Microalbumin/Creatinine Ratio, Urine (Completed)   Other Visit Diagnoses       Pre-op evaluation    -  Primary     High risk medication use       Relevant Orders   Hepatic function panel (Completed)      Labs pending. Based on current status, patient is cleared for surgery. Return in about 3 months (around 03/31/2024).

## 2024-01-09 ENCOUNTER — Encounter: Payer: Self-pay | Admitting: *Deleted

## 2024-01-10 ENCOUNTER — Encounter: Payer: Medicaid Other | Admitting: Physical Medicine and Rehabilitation

## 2024-01-10 ENCOUNTER — Ambulatory Visit: Payer: Medicaid Other | Admitting: Physical Medicine and Rehabilitation

## 2024-01-10 ENCOUNTER — Telehealth: Payer: Self-pay

## 2024-01-10 NOTE — Telephone Encounter (Signed)
Pt LVM in triage line requesting a cb regarding her bleeding.   Pt reports seeing some brown spotting for the last couple of days and is now having a red light to moderate flow with bad cramping.   Per encounter dated 12/10/2023: pt reported some mood changes with taking 2 doses of the provera at that time but was taking it due to previously continued bleeding with only one dose daily. However, pt states that now only taking one daily-she's having the bleeding and bad cramping again.   Is agreeable to going back to two doses daily if agreeable/recommended with provider. Pt stated she would rather deal with the moods than continued bleeding.   D&C scheduled for 01/24/2024.  Please advise.

## 2024-01-10 NOTE — Telephone Encounter (Signed)
Per GH: "Okay to increase provera to 20mg  BID PO until surgery."   Pt notified and voiced understanding.  Encounter closed.

## 2024-01-15 ENCOUNTER — Encounter (HOSPITAL_COMMUNITY): Payer: Self-pay | Admitting: Obstetrics and Gynecology

## 2024-01-15 NOTE — Progress Notes (Addendum)
 Spoke w/ via phone for pre-op interview---[name] needs dos----  BMP and CBG per anesthesia. Surgeon orders requested 01/14/24.       [name] results------ Current EKG in Epic dated 10/10/23 and current A1c 6.4 in Epic dated 01/02/24. COVID test -----patient states asymptomatic no test needed Arrive at -------0900 NPO after MN NO Solid Food.  Clear liquids from MN until---0800 Pre-Surgery Ensure or G2:  Med rec completed Medications to take morning of surgery -----Metoprolol, Provera, Entresto, Cymbalta, Dexilant and Celexa  Diabetic medication ----- Hold Jaridance 3 days prior to surgery per cardiology clearance instructions.  GLP1 agonist last dose:01/10/24-Trulicity GLP1 instructions:No further doses of Trulicity until after procedure, pt verbalized understanding.  Patient instructed no nail polish to be worn day of surgery Patient instructed to bring photo id and insurance card day of surgery Patient aware to have Driver (ride ) / caregiver    for 24 hours after surgery - [name] Patient Special Instructions -----Shower with antibacterial soap. Pre-Op special Instructions -----  Cardiac clearance in Epic dated 12/31/23 by Benjamine Sprague, NP  Patient verbalized understanding of instructions that were given at this phone interview. Patient denies chest pain, sob, fever, cough at the interview.

## 2024-01-16 ENCOUNTER — Encounter: Payer: Medicaid Other | Admitting: Obstetrics and Gynecology

## 2024-01-16 ENCOUNTER — Telehealth: Payer: Self-pay

## 2024-01-16 NOTE — Telephone Encounter (Signed)
 Patient also left message surgery line.  Patient is scheduled for surgery on 01/24/24.   Patient aware pre-op needs to be scheduled in office, declines visit on 01/22/24, rescheduled to 12/24/23 at 1445, arrive at 1430 for check-in.   Instructed patient to seek evaluation at urgent care or PCP if symptoms worsen or do not resolve. Will provide update to Dr. Kennith Center.   Routing to provider for final review. Patient is agreeable to disposition. Will close encounter.

## 2024-01-16 NOTE — Telephone Encounter (Signed)
 Pt inquiring if she needs to come in for her Pre-op today in-person or if it could be virtual?  I advised her at the time of call that she will likely need to come in so that we can be sure to get her correct vitals and any additional blood work that may be needed. 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   Pt advised at the time of call that I will ask Dr. Kennith Center anyway but advised her to rest with applying some heat and to take some tylenol anyway to try and help w/ relief. Pt stated she didn't have anyone to bring her to the appt.  Please advise.

## 2024-01-18 DIAGNOSIS — Z419 Encounter for procedure for purposes other than remedying health state, unspecified: Secondary | ICD-10-CM | POA: Diagnosis not present

## 2024-01-20 ENCOUNTER — Encounter: Payer: Self-pay | Admitting: *Deleted

## 2024-01-21 ENCOUNTER — Encounter: Payer: Self-pay | Admitting: Obstetrics and Gynecology

## 2024-01-21 ENCOUNTER — Ambulatory Visit (INDEPENDENT_AMBULATORY_CARE_PROVIDER_SITE_OTHER): Payer: Medicaid Other | Admitting: Obstetrics and Gynecology

## 2024-01-21 VITALS — BP 118/64 | HR 93 | Temp 98.3°F | Ht 63.0 in | Wt 237.0 lb

## 2024-01-21 DIAGNOSIS — N950 Postmenopausal bleeding: Secondary | ICD-10-CM | POA: Diagnosis not present

## 2024-01-21 DIAGNOSIS — R9389 Abnormal findings on diagnostic imaging of other specified body structures: Secondary | ICD-10-CM

## 2024-01-21 NOTE — Progress Notes (Signed)
 58 y.o. [id number] female with postmenopausal bleeding, nonischemic cardiomyopathy, left bundle branch block (pacemaker placed summer 2024), coronary artery disease, hypertension, type 2 diabetes, GERD, chronic back and leg pain, obesity here for preoperative exam for hysteroscopy D&C in the setting of ongoing postmenopausal bleeding. Married.  Patient is currently on Provera 20 mg twice daily. 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  Patient's last menstrual period was 04/05/2016.   New onset bleeding started around the beginning of December.  No other bleeding issues since menopause. Patient was initially evaluated by Dr. Edward Jolly and the following workup was completed: 10/24/2023: Transvaginal ultrasound with thickened endometrium 6 mm, 8 cm uterus with no fibroids or other masses.  Normal bilateral ovaries. 10/30/2023: Benign EMB She was started on Provera and the bleeding initially improved however she called GYN on 11/01/2023 with heavier bleeding. Provera was increased to 20 mg daily on 11/05/2023.  Today she reports no additional bleeding since the increase in dosing.  She notes intermittent cramping. Taking norco for back and leg, also using for moderate pelvic cramping.  She cannot take NSAIDs due to GERD.  Last A1c 6.4 Fasting sugars: 101  Today, she is doing well. No chest pain or shortness of breath.  She has been followed by cardiology. Taking norco TID-QID for chronic lower back pain. 12/31/23- cards cleared for surgery; hold jardiance 3d prior to procedure. Will also need to hold Trulicity (GLP-1) according to anesthesia recommendations. Held Friday. 2/13- cleared by PCP/NP, A1c 6.4 2/21 increased to 20mg  BID provera due to ongoing bleeding No bleeding since  GYN HISTORY: Hx of LEEP 2005-2006, and laser ablation prior to LEEP D&C for AUB-F x2, 2016, 2020 History of right breast cancer, diagnosed in 2012 status postlumpectomy  OB History  Gravida  Para Term Preterm AB Living  4 3 0 0 1 3  SAB IAB Ectopic Multiple Live Births  0 0 0  3    # Outcome Date GA Lbr Len/2nd Weight Sex Type Anes PTL Lv  4 AB           3 Para         LIV  2 Para         LIV  1 Para         LIV    Past Medical History:  Diagnosis Date   AICD (automatic cardioverter/defibrillator) present    Anxiety    Arthritis    Blood transfusion without reported diagnosis 1999   due to heavy menses   Breast cancer (HCC) 05/22/2011   03/01/11, Stage 2, s/p lumpectomy, chemo/xrt   CHF (congestive heart failure) (HCC)    Complication of anesthesia    pt woke up during procedure in colonoscopy/endoscopy   DDD (degenerative disc disease), lumbar    Depression 06/22/2011   Diverticulitis    DM (diabetes mellitus) (HCC) 06/22/2011   Genital warts    GERD (gastroesophageal reflux disease) 06/22/2011   Hx of adenomatous colonic polyps 10/2007   4mm sigmoid tubular adenoma, FH colon cancer, mother in mid-50s   Hypercholesterolemia 06/22/2011   Hypertension 06/22/2011   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   Iron deficiency anemia 03/25/2015   LBBB (left bundle branch block)    Mild CAD    Morbid obesity (HCC)    NICM (nonischemic cardiomyopathy) (HCC)    Personal history of chemotherapy    Personal history of radiation therapy    Presence of permanent cardiac pacemaker  PSVT (paroxysmal supraventricular tachycardia) (HCC)    Shortness of breath    STD (sexually transmitted disease)    HPV, 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   Syncope    Syncope    Vaginal bleeding 03/08/2014    Past Surgical History:  Procedure Laterality Date   back surg x2     BALLOON DILATION N/A 09/22/2021   Procedure: BALLOON DILATION;  Surgeon: Lanelle Bal, DO;  [facility]: [facility];  Service: Endoscopy;  Laterality: N/A;   BIOPSY  09/22/2021   Procedure: BIOPSY;  Surgeon: Lanelle Bal, DO;  [facility]: [facility];  Service: Endoscopy;;   BIOPSY   12/23/2023   Procedure: BIOPSY;  Surgeon: Lanelle Bal, DO;  [facility]: [facility];  Service: Endoscopy;;   BIV ICD INSERTION CRT-D N/A 04/23/2023   Procedure: BIV ICD INSERTION CRT-D;  Surgeon: Maurice Small, MD;  Location: [facility];  Service: Cardiovascular;  Laterality: N/A;   BREAST LUMPECTOMY Right 2012   CHOLECYSTECTOMY     COLONOSCOPY  11/03/07   4-mm sessile polyp removed/small internal hemorrhoids/tubular adenoma, random colon bx negative for microscopic colitis   COLONOSCOPY WITH PROPOFOL N/A 09/17/2016   Grade 2 hemorrhoids, colonic diverticulosis, ascending colon polyp (sessile serrated adenoma), 5 year surveillance   COLONOSCOPY WITH PROPOFOL N/A 09/22/2021   Procedure: COLONOSCOPY WITH PROPOFOL;  Surgeon: Lanelle Bal, DO;  [facility]: [facility];  Service: Endoscopy;  Laterality: N/A;  7:30am   COLONOSCOPY, ESOPHAGOGASTRODUODENOSCOPY (EGD) AND ESOPHAGEAL DILATION  01/2012   mild gastritis s/p biopsy. Empiric dilation. Normal duodenum.    DILATATION & CURETTAGE/HYSTEROSCOPY WITH MYOSURE N/A 02/10/2015   Procedure: DILATATION & CURETTAGE/HYSTEROSCOPY WITH MYOSURE;  Surgeon: Patton Salles, MD;  Location: [facility];  Service: Gynecology;  Laterality: N/A;   ESOPHAGOGASTRODUODENOSCOPY (EGD) WITH PROPOFOL N/A 01/11/2020   Normal esophagus, s/p dilation, normal stomach, normal duodenum.    ESOPHAGOGASTRODUODENOSCOPY (EGD) WITH PROPOFOL N/A 09/22/2021   Procedure: ESOPHAGOGASTRODUODENOSCOPY (EGD) WITH PROPOFOL;  Surgeon: Lanelle Bal, DO;  [facility];  Service: Endoscopy;  Laterality: N/A;   ESOPHAGOGASTRODUODENOSCOPY (EGD) WITH PROPOFOL N/A 12/23/2023   Procedure: ESOPHAGOGASTRODUODENOSCOPY (EGD) WITH PROPOFOL;  Surgeon: Lanelle Bal, DO;  [facility];  Service: Endoscopy;  Laterality: N/A;  11:15 am, asa 3, pt knows to arrive at 6:15   MALONEY DILATION N/A 01/11/2020   Procedure: Elease Hashimoto DILATION;  Surgeon: Corbin Ade,  MD;  [facility]: [facility];  Service: Endoscopy;  Laterality: N/A;   MM BREAST STEREO BX*L*R/S     rt.   POLYPECTOMY  09/17/2016   Procedure: POLYPECTOMY;  Surgeon: Corbin Ade, MD;  [facility]: [facility];  Service: Endoscopy;;  ascending colon   POLYPECTOMY  09/22/2021   Procedure: POLYPECTOMY;  Surgeon: Lanelle Bal, DO;  [facility];  Service: Endoscopy;;   POLYPECTOMY  12/23/2023   Procedure: POLYPECTOMY;  Surgeon: Lanelle Bal, DO;  [facility];  Service: Endoscopy;;   PORT-A-CATH REMOVAL  05/26/2012   Procedure: REMOVAL PORT-A-CATH;  Surgeon: [name], MD;  Location: [facility];  Service: General;  Laterality: N/A;  Minor Room   PORTACATH PLACEMENT      Current Facility-Administered Medications on File Prior to Visit  Medication Dose Route Frequency Provider Last Rate Last Admin   acetaminophen (TYLENOL) 500 MG tablet            chlorhexidine (PERIDEX) 0.12 % solution  insulin aspart (novoLOG) 100 UNIT/ML injection            insulin aspart (novoLOG) injection 0-14 Units  0-14 Units Subcutaneous Q2H PRN Mariann Barter, MD   2 Units at 01/24/24 6578   lactated ringers infusion   Intravenous Continuous Mariann Barter, MD 10 mL/hr at 01/24/24 0827 New Bag at 01/24/24 0827   lactated ringers infusion   Intravenous Continuous Parthena Fergeson V, MD       sodium chloride flush (NS) 0.9 % injection 3-10 mL  3-10 mL Intravenous Q12H Ronelle Nigh, MD       sodium chloride flush (NS) 0.9 % injection 3-10 mL  3-10 mL Intravenous PRN Ronelle Nigh, MD       Current Outpatient Medications on File Prior to Visit  Medication Sig Dispense Refill   atorvastatin (LIPITOR) 40 MG tablet TAKE 1 TABLET BY MOUTH AT BEDTIME 90 tablet 0   blood glucose meter kit and supplies Dispense based on patient and insurance preference. Use up to four times daily as directed. (FOR ICD-10 E10.9, E11.9). 1 each 5   Blood Glucose Monitoring Suppl DEVI 1 each by Does not  apply route 2 (two) times daily. May substitute to any manufacturer covered by patient's insurance. 1 each 0   Blood Pressure KIT 1 Units by Does not apply route daily. 1 kit 0   cetirizine (ZYRTEC) 10 MG tablet Take 1 tablet by mouth once daily 60 tablet 0   citalopram (CELEXA) 40 MG tablet TAKE 1 TABLET BY MOUTH EVERY DAY 90 tablet 2   dexlansoprazole (DEXILANT) 60 MG capsule Take 1 capsule (60 mg total) by mouth daily. 90 capsule 3   Dulaglutide (TRULICITY) 0.75 MG/0.5ML SOPN INJECT 0.75 MG SUBCUTANEOUSLY ONCE A WEEK 6 mL 5   DULoxetine (CYMBALTA) 30 MG capsule Take 1 capsule (30 mg total) by mouth daily. 90 capsule 1   empagliflozin (JARDIANCE) 10 MG TABS tablet Take 1 tablet (10 mg total) by mouth daily before breakfast. 90 tablet 0   ENTRESTO 24-26 MG TAKE 1 TABLET BY MOUTH TWICE A DAY 60 tablet 11   fluticasone (FLONASE) 50 MCG/ACT nasal spray USE 2 SPRAY(S) IN EACH NOSTRIL ONCE DAILY AS NEEDED 48 g 0   HYDROcodone-acetaminophen (NORCO) 5-325 MG tablet Take 1 tablet by mouth every 6 (six) hours as needed for moderate pain (pain score 4-6). Do Not Fill Before  02/ 14/2025 120 tablet 0   medroxyPROGESTERone (PROVERA) 10 MG tablet Take 2 tablets (20 mg total) by mouth daily. 60 tablet 2   metFORMIN (GLUCOPHAGE-XR) 750 MG 24 hr tablet Take 1 tablet by mouth once daily with breakfast 90 tablet 0   metoprolol succinate (TOPROL-XL) 50 MG 24 hr tablet Take 1 tablet by mouth once daily 90 tablet 3   ondansetron (ZOFRAN-ODT) 4 MG disintegrating tablet Take 1 tablet (4 mg total) by mouth every 8 (eight) hours as needed for nausea or vomiting. (Patient not taking: Reported on 01/23/2024) 20 tablet 0   ondansetron (ZOFRAN-ODT) 8 MG disintegrating tablet DISSOLVE 1 TABLET IN MOUTH EVERY 8 HOURS AS NEEDED FOR NAUSEA OR VOMITING 20 tablet 0   Vitamin D, Ergocalciferol, (DRISDOL) 1.25 MG (50000 UNIT) CAPS capsule TAKE 1 CAPSULE BY MOUTH ONE TIME PER WEEK 12 capsule 3    Allergies  Allergen Reactions    Flagyl [Metronidazole] Hives, Itching and Swelling   Lansoprazole Other (See Comments)    Stomach pain   Pravastatin Other (See Comments)    Muscle aches  PE Today's Vitals   01/21/24 1451  BP: 118/64  Pulse: 93  Temp: 98.3 F (36.8 C)  TempSrc: Oral  SpO2: 97%  Weight: 237 lb (107.5 kg)  Height: 5\' 3"  (1.6 m)    Body mass index is 41.98 kg/m.  Physical Exam Vitals reviewed.  Constitutional:      General: She is not in acute distress.    Appearance: Normal appearance.  HENT:     Head: Normocephalic and atraumatic.     Nose: Nose normal.  Eyes:     Extraocular Movements: Extraocular movements intact.     Conjunctiva/sclera: Conjunctivae normal.  Cardiovascular:     Rate and Rhythm: Normal rate and regular rhythm.     Heart sounds: No murmur heard.    No friction rub. No gallop.  Pulmonary:     Effort: Pulmonary effort is normal. No respiratory distress.     Breath sounds: Normal breath sounds. No stridor. No wheezing, rhonchi or rales.  Musculoskeletal:        General: Normal range of motion.     Cervical back: Normal range of motion.  Neurological:     General: No focal deficit present.     Mental Status: She is alert.  Psychiatric:        Mood and Affect: Mood normal.        Behavior: Behavior normal.       Assessment and Plan:        Postmenopausal bleeding Assessment & Plan: Reviewed causes of PMB, including genitourinary syndrome of menopause, infection, trauma, polyps, urinary and GI etiologies, and malignancy.   Plan for hysteroscopy, dilation and curettage given continued bleeding despite treatment with Provera.  Discussed outpatient procedure. Reviewed that  recovery is usually 1-2 days. Risks including infections, bleeding, and damage to surrounding organs reviewed. Recommend NPO prior to midnight and reviewed medication to take on day of surgery. Dicussed use of NSAIDS as needed for pain postoperatively.  Preop checklist: Antibiotics:  none DVT ppx: SCDs Postop visit: 1 week Additional clearance: medical and cardiac completed    Thickened endometrium  As above    Rosalyn Gess, MD

## 2024-01-21 NOTE — H&P (View-Only) (Signed)
 58 y.o. [id number] female with postmenopausal bleeding, nonischemic cardiomyopathy, left bundle branch block (pacemaker placed summer 2024), coronary artery disease, hypertension, type 2 diabetes, GERD, chronic back and leg pain, obesity here for preoperative exam for hysteroscopy D&C in the setting of ongoing postmenopausal bleeding. Married.  Patient is currently on Provera 20 mg twice daily. 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  Patient's last menstrual period was 04/05/2016.   New onset bleeding started around the beginning of December.  No other bleeding issues since menopause. Patient was initially evaluated by Dr. Edward Jolly and the following workup was completed: 10/24/2023: Transvaginal ultrasound with thickened endometrium 6 mm, 8 cm uterus with no fibroids or other masses.  Normal bilateral ovaries. 10/30/2023: Benign EMB She was started on Provera and the bleeding initially improved however she called GYN on 11/01/2023 with heavier bleeding. Provera was increased to 20 mg daily on 11/05/2023.  Today she reports no additional bleeding since the increase in dosing.  She notes intermittent cramping. Taking norco for back and leg, also using for moderate pelvic cramping.  She cannot take NSAIDs due to GERD.  Last A1c 6.4 Fasting sugars: 101  Today, she is doing well. No chest pain or shortness of breath.  She has been followed by cardiology. Taking norco TID-QID for chronic lower back pain. 12/31/23- cards cleared for surgery; hold jardiance 3d prior to procedure. Will also need to hold Trulicity (GLP-1) according to anesthesia recommendations. Held Friday. 2/13- cleared by PCP/NP, A1c 6.4 2/21 increased to 20mg  BID provera due to ongoing bleeding No bleeding since  GYN HISTORY: Hx of LEEP 2005-2006, and laser ablation prior to LEEP D&C for AUB-F x2, 2016, 2020 History of right breast cancer, diagnosed in 2012 status postlumpectomy  OB History  Gravida  Para Term Preterm AB Living  4 3 0 0 1 3  SAB IAB Ectopic Multiple Live Births  0 0 0  3    # Outcome Date GA Lbr Len/2nd Weight Sex Type Anes PTL Lv  4 AB           3 Para         LIV  2 Para         LIV  1 Para         LIV    Past Medical History:  Diagnosis Date   AICD (automatic cardioverter/defibrillator) present    Anxiety    Arthritis    Blood transfusion without reported diagnosis 1999   due to heavy menses   Breast cancer (HCC) 05/22/2011   03/01/11, Stage 2, s/p lumpectomy, chemo/xrt   CHF (congestive heart failure) (HCC)    Complication of anesthesia    pt woke up during procedure in colonoscopy/endoscopy   DDD (degenerative disc disease), lumbar    Depression 06/22/2011   Diverticulitis    DM (diabetes mellitus) (HCC) 06/22/2011   Genital warts    GERD (gastroesophageal reflux disease) 06/22/2011   Hx of adenomatous colonic polyps 10/2007   4mm sigmoid tubular adenoma, FH colon cancer, mother in mid-50s   Hypercholesterolemia 06/22/2011   Hypertension 06/22/2011   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   Iron deficiency anemia 03/25/2015   LBBB (left bundle branch block)    Mild CAD    Morbid obesity (HCC)    NICM (nonischemic cardiomyopathy) (HCC)    Personal history of chemotherapy    Personal history of radiation therapy    Presence of permanent cardiac pacemaker  PSVT (paroxysmal supraventricular tachycardia) (HCC)    Shortness of breath    STD (sexually transmitted disease)    HPV, 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   Syncope    Syncope    Vaginal bleeding 03/08/2014    Past Surgical History:  Procedure Laterality Date   back surg x2     BALLOON DILATION N/A 09/22/2021   Procedure: BALLOON DILATION;  Surgeon: Lanelle Bal, DO;  [facility];  Service: Endoscopy;  Laterality: N/A;   BIOPSY  09/22/2021   Procedure: BIOPSY;  Surgeon: Lanelle Bal, DO;  [facility];  Service: Endoscopy;;   BIOPSY   12/23/2023   Procedure: BIOPSY;  Surgeon: Lanelle Bal, DO;  [facility];  Service: Endoscopy;;   BIV ICD INSERTION CRT-D N/A 04/23/2023   Procedure: BIV ICD INSERTION CRT-D;  Surgeon: Maurice Small, MD;  Location: [facility];  Service: Cardiovascular;  Laterality: N/A;   BREAST LUMPECTOMY Right 2012   CHOLECYSTECTOMY     COLONOSCOPY  11/03/07   4-mm sessile polyp removed/small internal hemorrhoids/tubular adenoma, random colon bx negative for microscopic colitis   COLONOSCOPY WITH PROPOFOL N/A 09/17/2016   Grade 2 hemorrhoids, colonic diverticulosis, ascending colon polyp (sessile serrated adenoma), 5 year surveillance   COLONOSCOPY WITH PROPOFOL N/A 09/22/2021   Procedure: COLONOSCOPY WITH PROPOFOL;  Surgeon: Lanelle Bal, DO;  [facility];  Service: Endoscopy;  Laterality: N/A;  7:30am   COLONOSCOPY, ESOPHAGOGASTRODUODENOSCOPY (EGD) AND ESOPHAGEAL DILATION  01/2012   mild gastritis s/p biopsy. Empiric dilation. Normal duodenum.    DILATATION & CURETTAGE/HYSTEROSCOPY WITH MYOSURE N/A 02/10/2015   Procedure: DILATATION & CURETTAGE/HYSTEROSCOPY WITH MYOSURE;  Surgeon: Patton Salles, MD;  Location: [facility];  Service: Gynecology;  Laterality: N/A;   ESOPHAGOGASTRODUODENOSCOPY (EGD) WITH PROPOFOL N/A 01/11/2020   Normal esophagus, s/p dilation, normal stomach, normal duodenum.    ESOPHAGOGASTRODUODENOSCOPY (EGD) WITH PROPOFOL N/A 09/22/2021   Procedure: ESOPHAGOGASTRODUODENOSCOPY (EGD) WITH PROPOFOL;  Surgeon: Lanelle Bal, DO;  [facility];  Service: Endoscopy;  Laterality: N/A;   ESOPHAGOGASTRODUODENOSCOPY (EGD) WITH PROPOFOL N/A 12/23/2023   Procedure: ESOPHAGOGASTRODUODENOSCOPY (EGD) WITH PROPOFOL;  Surgeon: Lanelle Bal, DO;  [facility];  Service: Endoscopy;  Laterality: N/A;  11:15 am, asa 3, pt knows to arrive at 6:15   MALONEY DILATION N/A 01/11/2020   Procedure: Elease Hashimoto DILATION;  Surgeon: Corbin Ade,  MD;  [facility];  Service: Endoscopy;  Laterality: N/A;   MM BREAST STEREO BX*L*R/S     rt.   POLYPECTOMY  09/17/2016   Procedure: POLYPECTOMY;  Surgeon: Corbin Ade, MD;  [facility];  Service: Endoscopy;;  ascending colon   POLYPECTOMY  09/22/2021   Procedure: POLYPECTOMY;  Surgeon: Lanelle Bal, DO;  [facility];  Service: Endoscopy;;   POLYPECTOMY  12/23/2023   Procedure: POLYPECTOMY;  Surgeon: Lanelle Bal, DO;  [facility];  Service: Endoscopy;;   PORT-A-CATH REMOVAL  05/26/2012   Procedure: REMOVAL PORT-A-CATH;  Surgeon: [name], MD;  Location: [facility];  Service: General;  Laterality: N/A;  Minor Room   PORTACATH PLACEMENT      Current Facility-Administered Medications on File Prior to Visit  Medication Dose Route Frequency Provider Last Rate Last Admin   acetaminophen (TYLENOL) 500 MG tablet            chlorhexidine (PERIDEX) 0.12 % solution  insulin aspart (novoLOG) 100 UNIT/ML injection            insulin aspart (novoLOG) injection 0-14 Units  0-14 Units Subcutaneous Q2H PRN Mariann Barter, MD   2 Units at 01/24/24 6578   lactated ringers infusion   Intravenous Continuous Mariann Barter, MD 10 mL/hr at 01/24/24 0827 New Bag at 01/24/24 0827   lactated ringers infusion   Intravenous Continuous Parthena Fergeson V, MD       sodium chloride flush (NS) 0.9 % injection 3-10 mL  3-10 mL Intravenous Q12H Ronelle Nigh, MD       sodium chloride flush (NS) 0.9 % injection 3-10 mL  3-10 mL Intravenous PRN Ronelle Nigh, MD       Current Outpatient Medications on File Prior to Visit  Medication Sig Dispense Refill   atorvastatin (LIPITOR) 40 MG tablet TAKE 1 TABLET BY MOUTH AT BEDTIME 90 tablet 0   blood glucose meter kit and supplies Dispense based on patient and insurance preference. Use up to four times daily as directed. (FOR ICD-10 E10.9, E11.9). 1 each 5   Blood Glucose Monitoring Suppl DEVI 1 each by Does not  apply route 2 (two) times daily. May substitute to any manufacturer covered by patient's insurance. 1 each 0   Blood Pressure KIT 1 Units by Does not apply route daily. 1 kit 0   cetirizine (ZYRTEC) 10 MG tablet Take 1 tablet by mouth once daily 60 tablet 0   citalopram (CELEXA) 40 MG tablet TAKE 1 TABLET BY MOUTH EVERY DAY 90 tablet 2   dexlansoprazole (DEXILANT) 60 MG capsule Take 1 capsule (60 mg total) by mouth daily. 90 capsule 3   Dulaglutide (TRULICITY) 0.75 MG/0.5ML SOPN INJECT 0.75 MG SUBCUTANEOUSLY ONCE A WEEK 6 mL 5   DULoxetine (CYMBALTA) 30 MG capsule Take 1 capsule (30 mg total) by mouth daily. 90 capsule 1   empagliflozin (JARDIANCE) 10 MG TABS tablet Take 1 tablet (10 mg total) by mouth daily before breakfast. 90 tablet 0   ENTRESTO 24-26 MG TAKE 1 TABLET BY MOUTH TWICE A DAY 60 tablet 11   fluticasone (FLONASE) 50 MCG/ACT nasal spray USE 2 SPRAY(S) IN EACH NOSTRIL ONCE DAILY AS NEEDED 48 g 0   HYDROcodone-acetaminophen (NORCO) 5-325 MG tablet Take 1 tablet by mouth every 6 (six) hours as needed for moderate pain (pain score 4-6). Do Not Fill Before  02/ 14/2025 120 tablet 0   medroxyPROGESTERone (PROVERA) 10 MG tablet Take 2 tablets (20 mg total) by mouth daily. 60 tablet 2   metFORMIN (GLUCOPHAGE-XR) 750 MG 24 hr tablet Take 1 tablet by mouth once daily with breakfast 90 tablet 0   metoprolol succinate (TOPROL-XL) 50 MG 24 hr tablet Take 1 tablet by mouth once daily 90 tablet 3   ondansetron (ZOFRAN-ODT) 4 MG disintegrating tablet Take 1 tablet (4 mg total) by mouth every 8 (eight) hours as needed for nausea or vomiting. (Patient not taking: Reported on 01/23/2024) 20 tablet 0   ondansetron (ZOFRAN-ODT) 8 MG disintegrating tablet DISSOLVE 1 TABLET IN MOUTH EVERY 8 HOURS AS NEEDED FOR NAUSEA OR VOMITING 20 tablet 0   Vitamin D, Ergocalciferol, (DRISDOL) 1.25 MG (50000 UNIT) CAPS capsule TAKE 1 CAPSULE BY MOUTH ONE TIME PER WEEK 12 capsule 3    Allergies  Allergen Reactions    Flagyl [Metronidazole] Hives, Itching and Swelling   Lansoprazole Other (See Comments)    Stomach pain   Pravastatin Other (See Comments)    Muscle aches  PE Today's Vitals   01/21/24 1451  BP: 118/64  Pulse: 93  Temp: 98.3 F (36.8 C)  TempSrc: Oral  SpO2: 97%  Weight: 237 lb (107.5 kg)  Height: 5\' 3"  (1.6 m)    Body mass index is 41.98 kg/m.  Physical Exam Vitals reviewed.  Constitutional:      General: She is not in acute distress.    Appearance: Normal appearance.  HENT:     Head: Normocephalic and atraumatic.     Nose: Nose normal.  Eyes:     Extraocular Movements: Extraocular movements intact.     Conjunctiva/sclera: Conjunctivae normal.  Cardiovascular:     Rate and Rhythm: Normal rate and regular rhythm.     Heart sounds: No murmur heard.    No friction rub. No gallop.  Pulmonary:     Effort: Pulmonary effort is normal. No respiratory distress.     Breath sounds: Normal breath sounds. No stridor. No wheezing, rhonchi or rales.  Musculoskeletal:        General: Normal range of motion.     Cervical back: Normal range of motion.  Neurological:     General: No focal deficit present.     Mental Status: She is alert.  Psychiatric:        Mood and Affect: Mood normal.        Behavior: Behavior normal.       Assessment and Plan:        Postmenopausal bleeding Assessment & Plan: Reviewed causes of PMB, including genitourinary syndrome of menopause, infection, trauma, polyps, urinary and GI etiologies, and malignancy.   Plan for hysteroscopy, dilation and curettage given continued bleeding despite treatment with Provera.  Discussed outpatient procedure. Reviewed that  recovery is usually 1-2 days. Risks including infections, bleeding, and damage to surrounding organs reviewed. Recommend NPO prior to midnight and reviewed medication to take on day of surgery. Dicussed use of NSAIDS as needed for pain postoperatively.  Preop checklist: Antibiotics:  none DVT ppx: SCDs Postop visit: 1 week Additional clearance: medical and cardiac completed    Thickened endometrium  As above    Rosalyn Gess, MD

## 2024-01-22 ENCOUNTER — Ambulatory Visit (INDEPENDENT_AMBULATORY_CARE_PROVIDER_SITE_OTHER): Payer: Medicaid Other

## 2024-01-22 DIAGNOSIS — I428 Other cardiomyopathies: Secondary | ICD-10-CM | POA: Diagnosis not present

## 2024-01-23 NOTE — Progress Notes (Signed)
 Pt returning call about arrival time change. Informed pt of updated arrival time 0745, clear liquids until 0630. Pt verbalized understanding.

## 2024-01-24 ENCOUNTER — Encounter (HOSPITAL_COMMUNITY): Payer: Self-pay | Admitting: Obstetrics and Gynecology

## 2024-01-24 ENCOUNTER — Encounter (HOSPITAL_COMMUNITY)
Admission: RE | Disposition: A | Discharge status: Home / Self Care | Payer: Self-pay | Source: Home / Self Care | Attending: Obstetrics and Gynecology

## 2024-01-24 ENCOUNTER — Other Ambulatory Visit: Payer: Self-pay

## 2024-01-24 ENCOUNTER — Encounter: Payer: Self-pay | Admitting: Obstetrics and Gynecology

## 2024-01-24 ENCOUNTER — Ambulatory Visit (HOSPITAL_BASED_OUTPATIENT_CLINIC_OR_DEPARTMENT_OTHER): Admitting: Certified Registered Nurse Anesthetist

## 2024-01-24 ENCOUNTER — Ambulatory Visit (HOSPITAL_COMMUNITY)
Admission: RE | Admit: 2024-01-24 | Discharge: 2024-01-24 | Disposition: A | Discharge status: Home / Self Care | Payer: Medicaid Other | Attending: Obstetrics and Gynecology | Admitting: Obstetrics and Gynecology

## 2024-01-24 ENCOUNTER — Ambulatory Visit (HOSPITAL_COMMUNITY): Admitting: Certified Registered Nurse Anesthetist

## 2024-01-24 DIAGNOSIS — Z79899 Other long term (current) drug therapy: Secondary | ICD-10-CM | POA: Insufficient documentation

## 2024-01-24 DIAGNOSIS — Z6841 Body Mass Index (BMI) 40.0 and over, adult: Secondary | ICD-10-CM | POA: Insufficient documentation

## 2024-01-24 DIAGNOSIS — I10 Essential (primary) hypertension: Secondary | ICD-10-CM

## 2024-01-24 DIAGNOSIS — I2510 Atherosclerotic heart disease of native coronary artery without angina pectoris: Secondary | ICD-10-CM | POA: Insufficient documentation

## 2024-01-24 DIAGNOSIS — I110 Hypertensive heart disease with heart failure: Secondary | ICD-10-CM | POA: Insufficient documentation

## 2024-01-24 DIAGNOSIS — I471 Supraventricular tachycardia, unspecified: Secondary | ICD-10-CM | POA: Diagnosis not present

## 2024-01-24 DIAGNOSIS — G8929 Other chronic pain: Secondary | ICD-10-CM | POA: Diagnosis not present

## 2024-01-24 DIAGNOSIS — F32A Depression, unspecified: Secondary | ICD-10-CM | POA: Insufficient documentation

## 2024-01-24 DIAGNOSIS — I428 Other cardiomyopathies: Secondary | ICD-10-CM | POA: Insufficient documentation

## 2024-01-24 DIAGNOSIS — K219 Gastro-esophageal reflux disease without esophagitis: Secondary | ICD-10-CM | POA: Insufficient documentation

## 2024-01-24 DIAGNOSIS — I447 Left bundle-branch block, unspecified: Secondary | ICD-10-CM | POA: Diagnosis not present

## 2024-01-24 DIAGNOSIS — N840 Polyp of corpus uteri: Secondary | ICD-10-CM | POA: Diagnosis not present

## 2024-01-24 DIAGNOSIS — E66813 Obesity, class 3: Secondary | ICD-10-CM | POA: Diagnosis not present

## 2024-01-24 DIAGNOSIS — M79606 Pain in leg, unspecified: Secondary | ICD-10-CM | POA: Insufficient documentation

## 2024-01-24 DIAGNOSIS — M549 Dorsalgia, unspecified: Secondary | ICD-10-CM | POA: Diagnosis not present

## 2024-01-24 DIAGNOSIS — I509 Heart failure, unspecified: Secondary | ICD-10-CM | POA: Insufficient documentation

## 2024-01-24 DIAGNOSIS — Z7985 Long-term (current) use of injectable non-insulin antidiabetic drugs: Secondary | ICD-10-CM | POA: Insufficient documentation

## 2024-01-24 DIAGNOSIS — M1990 Unspecified osteoarthritis, unspecified site: Secondary | ICD-10-CM | POA: Diagnosis not present

## 2024-01-24 DIAGNOSIS — R9389 Abnormal findings on diagnostic imaging of other specified body structures: Secondary | ICD-10-CM | POA: Insufficient documentation

## 2024-01-24 DIAGNOSIS — E119 Type 2 diabetes mellitus without complications: Secondary | ICD-10-CM | POA: Insufficient documentation

## 2024-01-24 DIAGNOSIS — F418 Other specified anxiety disorders: Secondary | ICD-10-CM

## 2024-01-24 DIAGNOSIS — N950 Postmenopausal bleeding: Secondary | ICD-10-CM | POA: Diagnosis not present

## 2024-01-24 DIAGNOSIS — Z7984 Long term (current) use of oral hypoglycemic drugs: Secondary | ICD-10-CM | POA: Diagnosis not present

## 2024-01-24 DIAGNOSIS — Z95810 Presence of automatic (implantable) cardiac defibrillator: Secondary | ICD-10-CM | POA: Insufficient documentation

## 2024-01-24 DIAGNOSIS — F419 Anxiety disorder, unspecified: Secondary | ICD-10-CM | POA: Insufficient documentation

## 2024-01-24 HISTORY — PX: DILATATION & CURETTAGE/HYSTEROSCOPY WITH MYOSURE: SHX6511

## 2024-01-24 LAB — BASIC METABOLIC PANEL
Anion gap: 12 (ref 5–15)
BUN: 7 mg/dL (ref 6–20)
CO2: 21 mmol/L — ABNORMAL LOW (ref 22–32)
Calcium: 8.8 mg/dL — ABNORMAL LOW (ref 8.9–10.3)
Chloride: 107 mmol/L (ref 98–111)
Creatinine, Ser: 0.81 mg/dL (ref 0.44–1.00)
GFR, Estimated: >60 mL/min (ref >60)
Glucose, Bld: 138 mg/dL — ABNORMAL HIGH (ref 70–99)
Potassium: 3.5 mmol/L (ref 3.5–5.1)
Sodium: 140 mmol/L (ref 135–145)

## 2024-01-24 LAB — CUP PACEART REMOTE DEVICE CHECK
Battery Remaining Longevity: 122 mo
Battery Voltage: 3.02 V
Brady Statistic RV Percent Paced: 5.96 %
Date Time Interrogation Session: 20250304192009
HighPow Impedance: 74 Ohm
Implantable Lead Connection Status: 753985
Implantable Lead Connection Status: 753985
Implantable Lead Connection Status: 753985
Implantable Lead Implant Date: 20240604
Implantable Lead Implant Date: 20240604
Implantable Lead Implant Date: 20240604
Implantable Lead Location: 753858
Implantable Lead Location: 753859
Implantable Lead Location: 753860
Implantable Lead Model: 4598
Implantable Lead Model: 5076
Implantable Pulse Generator Implant Date: 20240604
Lead Channel Impedance Value: 1102 Ohm
Lead Channel Impedance Value: 1121 Ohm
Lead Channel Impedance Value: 323 Ohm
Lead Channel Impedance Value: 456 Ohm
Lead Channel Impedance Value: 532 Ohm
Lead Channel Impedance Value: 551 Ohm
Lead Channel Impedance Value: 703 Ohm
Lead Channel Impedance Value: 722 Ohm
Lead Channel Impedance Value: 760 Ohm
Lead Channel Impedance Value: 760 Ohm
Lead Channel Impedance Value: 817 Ohm
Lead Channel Impedance Value: 836 Ohm
Lead Channel Impedance Value: 969 Ohm
Lead Channel Pacing Threshold Amplitude: 0.5 V
Lead Channel Pacing Threshold Amplitude: 0.625 V
Lead Channel Pacing Threshold Amplitude: 1 V
Lead Channel Pacing Threshold Pulse Width: 0.4 ms
Lead Channel Pacing Threshold Pulse Width: 0.4 ms
Lead Channel Pacing Threshold Pulse Width: 0.4 ms
Lead Channel Sensing Intrinsic Amplitude: 2.1 mV
Lead Channel Sensing Intrinsic Amplitude: 9.8 mV
Lead Channel Setting Pacing Amplitude: 1.25 V
Lead Channel Setting Pacing Amplitude: 1.5 V
Lead Channel Setting Pacing Amplitude: 2 V
Lead Channel Setting Pacing Pulse Width: 0.4 ms
Lead Channel Setting Pacing Pulse Width: 0.4 ms
Lead Channel Setting Sensing Sensitivity: 0.3 mV
Zone Setting Status: 755011
Zone Setting Status: 755011
Zone Setting Status: 755011

## 2024-01-24 LAB — GLUCOSE, CAPILLARY
Glucose-Capillary: 144 mg/dL — ABNORMAL HIGH (ref 70–99)
Glucose-Capillary: 145 mg/dL — ABNORMAL HIGH (ref 70–99)

## 2024-01-24 SURGERY — DILATATION & CURETTAGE/HYSTEROSCOPY WITH MYOSURE
Anesthesia: General

## 2024-01-24 MED ORDER — FENTANYL CITRATE (PF) 250 MCG/5ML IJ SOLN
INTRAMUSCULAR | Status: DC | PRN
  Administered 2024-01-24 (×2): 25 ug via INTRAVENOUS

## 2024-01-24 MED ORDER — CHLORHEXIDINE GLUCONATE 0.12 % MT SOLN
OROMUCOSAL | Status: AC
  Filled 2024-01-24: qty 15

## 2024-01-24 MED ORDER — PROPOFOL 10 MG/ML IV BOLUS
INTRAVENOUS | Status: DC | PRN
  Administered 2024-01-24: 150 mg via INTRAVENOUS

## 2024-01-24 MED ORDER — LACTATED RINGERS IV SOLN: INTRAVENOUS | Status: DC

## 2024-01-24 MED ORDER — ONDANSETRON HCL 4 MG/2ML IJ SOLN
INTRAMUSCULAR | Status: DC | PRN
  Administered 2024-01-24: 4 mg via INTRAVENOUS

## 2024-01-24 MED ORDER — MIDAZOLAM HCL 2 MG/2ML IJ SOLN
INTRAMUSCULAR | Status: AC
  Filled 2024-01-24: qty 2

## 2024-01-24 MED ORDER — DEXAMETHASONE SODIUM PHOSPHATE 10 MG/ML IJ SOLN
INTRAMUSCULAR | Status: DC | PRN
  Administered 2024-01-24: 4 mg via INTRAVENOUS

## 2024-01-24 MED ORDER — SODIUM CHLORIDE 0.9 % IR SOLN
Status: DC | PRN
  Administered 2024-01-24: 3000 mL

## 2024-01-24 MED ORDER — INSULIN ASPART 100 UNIT/ML IJ SOLN
0.0000 [IU] | INTRAMUSCULAR | Status: DC | PRN
  Administered 2024-01-24: 2 [IU] via SUBCUTANEOUS

## 2024-01-24 MED ORDER — LIDOCAINE HCL (PF) 1 % IJ SOLN
INTRAMUSCULAR | Status: DC | PRN
  Administered 2024-01-24: 10 mL

## 2024-01-24 MED ORDER — ACETAMINOPHEN 500 MG PO TABS
ORAL_TABLET | ORAL | Status: AC
  Filled 2024-01-24: qty 2

## 2024-01-24 MED ORDER — SODIUM CHLORIDE 0.9% FLUSH: 3.0000 mL | Freq: Two times a day (BID) | INTRAVENOUS | Status: DC

## 2024-01-24 MED ORDER — LIDOCAINE 2% (20 MG/ML) 5 ML SYRINGE
INTRAMUSCULAR | Status: DC | PRN
  Administered 2024-01-24: 60 mg via INTRAVENOUS

## 2024-01-24 MED ORDER — LIDOCAINE HCL (PF) 1 % IJ SOLN
INTRAMUSCULAR | Status: AC
Start: 2024-01-24 — End: ?
  Filled 2024-01-24: qty 30

## 2024-01-24 MED ORDER — PROPOFOL 10 MG/ML IV BOLUS
INTRAVENOUS | Status: AC
  Filled 2024-01-24: qty 20

## 2024-01-24 MED ORDER — ACETAMINOPHEN 500 MG PO TABS
1000.0000 mg | ORAL_TABLET | ORAL | Status: AC
  Administered 2024-01-24: 1000 mg via ORAL

## 2024-01-24 MED ORDER — PHENYLEPHRINE 80 MCG/ML (10ML) SYRINGE FOR IV PUSH (FOR BLOOD PRESSURE SUPPORT)
PREFILLED_SYRINGE | INTRAVENOUS | Status: DC | PRN
  Administered 2024-01-24 (×3): 80 ug via INTRAVENOUS

## 2024-01-24 MED ORDER — FENTANYL CITRATE (PF) 250 MCG/5ML IJ SOLN
INTRAMUSCULAR | Status: AC
  Filled 2024-01-24: qty 5

## 2024-01-24 MED ORDER — MEDROXYPROGESTERONE ACETATE 10 MG PO TABS: 20.0000 mg | ORAL_TABLET | Freq: Every day | ORAL | Status: DC

## 2024-01-24 MED ORDER — LIDOCAINE 2% (20 MG/ML) 5 ML SYRINGE
INTRAMUSCULAR | Status: AC
  Filled 2024-01-24: qty 5

## 2024-01-24 MED ORDER — MIDAZOLAM HCL 2 MG/2ML IJ SOLN
INTRAMUSCULAR | Status: DC | PRN
  Administered 2024-01-24 (×2): 1 mg via INTRAVENOUS

## 2024-01-24 MED ORDER — SODIUM CHLORIDE 0.9% FLUSH: 3.0000 mL | INTRAVENOUS | Status: DC | PRN

## 2024-01-24 MED ORDER — EPHEDRINE 5 MG/ML INJ
INTRAVENOUS | Status: AC
  Filled 2024-01-24: qty 5

## 2024-01-24 MED ORDER — INSULIN ASPART 100 UNIT/ML IJ SOLN
INTRAMUSCULAR | Status: AC
  Filled 2024-01-24: qty 1

## 2024-01-24 MED ORDER — EPHEDRINE SULFATE-NACL 50-0.9 MG/10ML-% IV SOSY
PREFILLED_SYRINGE | INTRAVENOUS | Status: DC | PRN
  Administered 2024-01-24 (×2): 5 mg via INTRAVENOUS

## 2024-01-24 MED ORDER — ROCURONIUM BROMIDE 10 MG/ML (PF) SYRINGE
PREFILLED_SYRINGE | INTRAVENOUS | Status: AC
  Filled 2024-01-24: qty 10

## 2024-01-24 MED ORDER — ORAL CARE MOUTH RINSE: 15.0000 mL | Freq: Once | OROMUCOSAL | Status: AC

## 2024-01-24 MED ORDER — CHLORHEXIDINE GLUCONATE 0.12 % MT SOLN
15.0000 mL | Freq: Once | OROMUCOSAL | Status: AC
  Administered 2024-01-24: 15 mL via OROMUCOSAL

## 2024-01-24 SURGICAL SUPPLY — 18 items
DEVICE MYOSURE LITE (MISCELLANEOUS) IMPLANT
DEVICE MYOSURE REACH (MISCELLANEOUS) IMPLANT
DILATOR CANAL MILEX (MISCELLANEOUS) IMPLANT
GAUZE 4X4 16PLY ~~LOC~~+RFID DBL (SPONGE) IMPLANT
GLOVE BIO SURGEON STRL SZ7.5 (GLOVE) ×1 IMPLANT
GLOVE BIOGEL PI IND STRL 7.5 (GLOVE) ×1 IMPLANT
GOWN STRL REUS W/TWL XL LVL3 (GOWN DISPOSABLE) ×1 IMPLANT
HIBICLENS CHG 4% 4OZ BTL (MISCELLANEOUS) ×1 IMPLANT
IV NS IRRIG 3000ML ARTHROMATIC (IV SOLUTION) ×1 IMPLANT
KIT PROCEDURE FLUENT (KITS) ×1 IMPLANT
KIT TURNOVER KIT B (KITS) ×1 IMPLANT
MYOSURE XL FIBROID (MISCELLANEOUS) IMPLANT
PACK VAGINAL MINOR WOMEN LF (CUSTOM PROCEDURE TRAY) ×1 IMPLANT
PAD OB MATERNITY 11 LF (PERSONAL CARE ITEMS) ×1 IMPLANT
PAD PREP 24X48 CUFFED NSTRL (MISCELLANEOUS) ×1 IMPLANT
SEAL ROD LENS SCOPE MYOSURE (ABLATOR) ×1 IMPLANT
SYSTEM TISS REMOVAL MYOSURE XL (MISCELLANEOUS) IMPLANT
TOWEL OR 17X24 6PK STRL BLUE (TOWEL DISPOSABLE) ×1 IMPLANT

## 2024-01-24 NOTE — Anesthesia Preprocedure Evaluation (Addendum)
 Anesthesia Evaluation  Patient identified by MRN, date of birth, ID band Patient awake    Reviewed: Allergy & Precautions, H&P , NPO status , Patient's Chart, lab work & pertinent test results, reviewed documented beta blocker date and time   Airway Mallampati: III  TM Distance: >3 FB Neck ROM: Full    Dental no notable dental hx. (+) Teeth Intact, Dental Advisory Given   Pulmonary neg pulmonary ROS   Pulmonary exam normal breath sounds clear to auscultation       Cardiovascular hypertension, Pt. on medications and Pt. on home beta blockers +CHF  + dysrhythmias Supra Ventricular Tachycardia + pacemaker + Cardiac Defibrillator  Rhythm:Regular Rate:Normal     Neuro/Psych   Anxiety Depression    negative neurological ROS     GI/Hepatic Neg liver ROS,GERD  Medicated,,  Endo/Other  diabetes, Type 2, Oral Hypoglycemic Agents  Class 3 obesity  Renal/GU negative Renal ROS  negative genitourinary   Musculoskeletal  (+) Arthritis , Osteoarthritis,    Abdominal   Peds  Hematology  (+) Blood dyscrasia, anemia   Anesthesia Other Findings   Reproductive/Obstetrics negative OB ROS                             Anesthesia Physical Anesthesia Plan  ASA: 3  Anesthesia Plan: General   Post-op Pain Management: Tylenol PO (pre-op)* and Toradol IV (intra-op)*   Induction: Intravenous  PONV Risk Score and Plan: 4 or greater and Ondansetron, Dexamethasone and Midazolam  Airway Management Planned: LMA  Additional Equipment:   Intra-op Plan:   Post-operative Plan: Extubation in OR  Informed Consent: I have reviewed the patients History and Physical, chart, labs and discussed the procedure including the risks, benefits and alternatives for the proposed anesthesia with the patient or authorized representative who has indicated his/her understanding and acceptance.     Dental advisory given  Plan  Discussed with: CRNA  Anesthesia Plan Comments:        Anesthesia Quick Evaluation

## 2024-01-24 NOTE — Anesthesia Postprocedure Evaluation (Signed)
 Anesthesia Post Note  Patient: LYRIQ FINERTY  Procedure(s) Performed: DILATATION & CURETTAGE/HYSTEROSCOPY WITH MYOSURE     Patient location during evaluation: PACU Anesthesia Type: General Level of consciousness: awake and alert Pain management: pain level controlled Vital Signs Assessment: post-procedure vital signs reviewed and stable Respiratory status: spontaneous breathing, nonlabored ventilation and respiratory function stable Cardiovascular status: blood pressure returned to baseline and stable Postop Assessment: no apparent nausea or vomiting Anesthetic complications: no  No notable events documented.  Last Vitals:  Vitals:   01/24/24 1045 01/24/24 1115  BP:  124/79  Pulse:  88  Resp: 17 18  Temp: 36.9 C   SpO2: 97% 97%    Last Pain:  Vitals:   01/24/24 1115  TempSrc:   PainSc: 1                  Quina Wilbourne,W. EDMOND

## 2024-01-24 NOTE — Progress Notes (Signed)
 PACU DISCHARGE NURSING NOTE: order to DC to home/ self care. VSS in Phase I PACU Ambulated with minimal assistance to restroom Tolerating POs well, with not complaints of nausea Pain as described as "soreness" not requiring any pharmacological intervention Peri-Pad Clean and Dry Client voided, without issue AVS reviewed with patient and daughter Client escorted to exit via wc Daughter here to drive pt home and be with her Opportunity for questions provided prior to DC

## 2024-01-24 NOTE — Interval H&P Note (Signed)
 History and Physical Interval Note:  01/24/2024 8:54 AM  [name]  has presented today for surgery, with the diagnosis of postmenopausal bleeding.  The various methods of treatment have been discussed with the patient and family. After consideration of risks, benefits and other options for treatment, the patient has consented to  Procedure(s) with comments: DILATATION & CURETTAGE/HYSTEROSCOPY WITH MYOSURE (N/A) [facility] as a surgical intervention.  The patient's history has been reviewed, patient examined, no change in status, stable for surgery.  I have reviewed the patient's chart and labs.  Questions were answered to the patient's satisfaction.     [name] Lasandra Beech

## 2024-01-24 NOTE — Op Note (Signed)
 01/24/2024 [name] [id number]  OPERATIVE REPORT  Preop Diagnosis: * No Diagnosis Codes entered *   Post operative diagnosis: same  Procedure: Diagnostic hysteroscopy, dilation and curettage  Surgeon: Rosalyn Gess, MD Assistant: none  Anesthesia: MAC Fluids: please see anesthesia report Fluid deficit: 85cc  Complications: None  Findings: Normal cervix. Tterine cavity measuring 7cm with endometrial polyp and both ostia seen.  Estimated blood loss: Minimal  Specimens:  ID Type Source Tests Collected by Time Destination  1 : Endometrial polyp Tissue PATH Soft tissue SURGICAL PATHOLOGY Rosalyn Gess, MD 01/24/2024 0945   2 : [facility] Tissue PATH Soft tissue SURGICAL PATHOLOGY Rosalyn Gess, MD 01/24/2024 [phone]     Disposition of specimen: Pathology    Procedure: Patient was taken to the OR where she was placed in dorsal lithotomy in stirrups. She voided prior to transport. SCDs were in place.  The patient was prepped and draped in the usual sterile fashion. An adequate timeout was obtained and everyone agreed. A bivalve speculum was placed inside the vagina and the cervix visualized. The cervix was grasped anteriorly with a single-tooth tenaculum. Paracervical block was performed with 1% lidocaine.  The uterus sounded to 7 cm. Sequential cervical dilation was done to 51fr, and the myosure hysteroscope was introduced into the uterine cavity. The cervix and endometrial lining appeared normal both ostia were seen. Findings as noted. A small polyp was seen and removed.  This was also used to sample the entire cavity.  A sharp curettage was then performed to ensure complete sampling of the cavity and was sent to pathology. All instrument, sponge and lap counts were correct x2. The patient was awakened taken to recovery room in stable condition.  Rosalyn Gess MD 01/24/2024 9:52 AM

## 2024-01-24 NOTE — Assessment & Plan Note (Signed)
 Reviewed causes of PMB, including genitourinary syndrome of menopause, infection, trauma, polyps, urinary and GI etiologies, and malignancy.   Plan for hysteroscopy, dilation and curettage given continued bleeding despite treatment with Provera.  Discussed outpatient procedure. Reviewed that  recovery is usually 1-2 days. Risks including infections, bleeding, and damage to surrounding organs reviewed. Recommend NPO prior to midnight and reviewed medication to take on day of surgery. Dicussed use of NSAIDS as needed for pain postoperatively.  Preop checklist: Antibiotics: none DVT ppx: SCDs Postop visit: 1 week Additional clearance: medical and cardiac completed

## 2024-01-24 NOTE — Transfer of Care (Signed)
 Immediate Anesthesia Transfer of Care Note  Patient: [name]  Procedure(s) Performed: DILATATION & CURETTAGE/HYSTEROSCOPY WITH MYOSURE  Patient Location: PACU  Anesthesia Type:General  Level of Consciousness: drowsy and patient cooperative  Airway & Oxygen Therapy: Patient Spontanous Breathing and Patient connected to face mask oxygen  Post-op Assessment: Report given to RN and Post -op Vital signs reviewed and stable  Post vital signs: Reviewed and stable  Last Vitals:  Vitals Value Taken Time  BP    Temp    Pulse 96 01/24/24 1004  Resp 14 01/24/24 1004  SpO2 100 % 01/24/24 1004  Vitals shown include unfiled device data.  Last Pain:  Vitals:   01/24/24 0804  TempSrc: Oral  PainSc: 4       Patients Stated Pain Goal: 5 (01/24/24 0804)  Complications: No notable events documented.

## 2024-01-24 NOTE — Discharge Instructions (Addendum)
  Post Anesthesia Home Care Instructions  Activity: Get plenty of rest for the remainder of the day. A responsible individual must stay with you for 24 hours following the procedure.  For the next 24 hours, DO NOT: -Drive a car -Advertising copywriter -Drink alcoholic beverages -Take any medication unless instructed by your physician -Make any legal decisions or sign important papers.  Meals: Start with liquid foods such as gelatin or soup. Progress to regular foods as tolerated. Avoid greasy, spicy, heavy foods. If nausea and/or vomiting occur, drink only clear liquids until the nausea and/or vomiting subsides. Call your physician if vomiting continues.  Special Instructions/Symptoms: Your throat may feel dry or sore from the anesthesia or the breathing tube placed in your throat during surgery. If this causes discomfort, gargle with warm salt water. The discomfort should disappear within 24 hours.     D & C Home care Instructions:   Personal hygiene:  Used sanitary napkins for vaginal drainage not tampons. Shower or tub bathe the day after your procedure. No douching until bleeding stops. Always wipe from front to back after  Elimination.  Activity: Do not drive or operate any equipment today. The effects of the anesthesia are still present and drowsiness may result. Rest today, not necessarily flat bed rest, just take it easy. You may resume your normal activity in one to 2 days.  Sexual activity: No intercourse for one week or as indicated by your physician  Diet: Eat a light diet as desired this evening. You may resume a regular diet tomorrow.  General Expectations of your surgery: Vaginal bleeding should be no heavier than a normal period. Spotting may continue up to 10 days. Mild cramps may continue for a couple of days. You may have a regular period in 2-6 weeks.  Unexpected observations call your doctor if these occur: persistent or heavy bleeding. Severe abdominal cramping or  pain. Elevation of temperature greater than 100F.  Call for an appointment in one week.

## 2024-01-24 NOTE — Anesthesia Procedure Notes (Signed)
 Procedure Name: LMA Insertion Date/Time: 01/24/2024 9:25 AM  Performed by: Audie Pinto, CRNAPre-anesthesia Checklist: Patient identified, Emergency Drugs available, Suction available and Patient being monitored Patient Re-evaluated:Patient Re-evaluated prior to induction Oxygen Delivery Method: Circle system utilized Preoxygenation: Pre-oxygenation with 100% oxygen Induction Type: IV induction LMA: LMA inserted LMA Size: 4.0 Placement Confirmation: positive ETCO2 Dental Injury: Teeth and Oropharynx as per pre-operative assessment

## 2024-01-25 ENCOUNTER — Encounter (HOSPITAL_COMMUNITY): Payer: Self-pay | Admitting: Obstetrics and Gynecology

## 2024-01-27 ENCOUNTER — Telehealth: Payer: Self-pay | Admitting: *Deleted

## 2024-01-27 LAB — SURGICAL PATHOLOGY

## 2024-01-27 NOTE — Telephone Encounter (Signed)
 Spoke with patient. S/p Hysteroscopy D&C 01/24/24.   Patient reports she was spotting after surgery, took a bath Saturday night, bleeding increased to flow with small clots. Changing non-saturated pad q2 hrs. Cramping 6 out of 10. No taking tylenol or motrin. Took hydrocodone for pain in her back and leg, this helped cramping. Has not taken provera since prior to surgery. Patient states she was advised to monitor and provide update.   Patient denies any other GYN symptoms, fever/chills, N/V or vaginal odor.   Dr. Kennith Center -please advise on bleeding and provera.

## 2024-01-27 NOTE — Telephone Encounter (Signed)
 Light bleeding can last up to 7d following surgery. I would like to determine if she can stay off of this medication, so waiting longer would be ideal. She should continue to hold provera for the first week unless she is passing large clots or is having a heavy flow. Ibuprofen would work better for cramping and minimize bleeding.  Please have her continue to provide updates.

## 2024-01-27 NOTE — Telephone Encounter (Signed)
 Spoke with patient, advised per Dr. Kennith Center.   Patient verbalizes understanding and is agreeable.   Encounter closed.

## 2024-01-30 ENCOUNTER — Encounter: Payer: Self-pay | Admitting: Cardiovascular Disease

## 2024-02-03 ENCOUNTER — Other Ambulatory Visit: Payer: Self-pay | Admitting: Family Medicine

## 2024-02-04 ENCOUNTER — Telehealth: Payer: Self-pay | Admitting: Physical Medicine and Rehabilitation

## 2024-02-04 MED ORDER — HYDROCODONE-ACETAMINOPHEN 5-325 MG PO TABS: 1.0000 | ORAL_TABLET | Freq: Four times a day (QID) | ORAL | 0 refills | Status: DC | PRN

## 2024-02-04 NOTE — Telephone Encounter (Signed)
 Requesting refill on hydrocodone, send to [facility]

## 2024-02-06 ENCOUNTER — Ambulatory Visit
Admission: EM | Admit: 2024-02-06 | Discharge: 2024-02-06 | Disposition: A | Discharge status: Home / Self Care | Attending: Nurse Practitioner | Admitting: Nurse Practitioner

## 2024-02-06 ENCOUNTER — Encounter: Payer: Self-pay | Admitting: Emergency Medicine

## 2024-02-06 DIAGNOSIS — J069 Acute upper respiratory infection, unspecified: Secondary | ICD-10-CM

## 2024-02-06 LAB — POC COVID19/FLU A&B COMBO
Covid Antigen, POC: NEGATIVE
Influenza A Antigen, POC: NEGATIVE
Influenza B Antigen, POC: NEGATIVE

## 2024-02-06 MED ORDER — CETIRIZINE-PSEUDOEPHEDRINE ER 5-120 MG PO TB12: 1.0000 | ORAL_TABLET | Freq: Every day | ORAL | 0 refills | Status: DC

## 2024-02-06 MED ORDER — FLUTICASONE PROPIONATE 50 MCG/ACT NA SUSP: 2.0000 | Freq: Every day | NASAL | 0 refills | Status: DC

## 2024-02-06 MED ORDER — PROMETHAZINE-DM 6.25-15 MG/5ML PO SYRP: 5.0000 mL | ORAL_SOLUTION | Freq: Four times a day (QID) | ORAL | 0 refills | Status: DC | PRN

## 2024-02-06 NOTE — ED Provider Notes (Signed)
 [facility]    CSN: [id number] Arrival date & time: 02/06/24  1123      History   Chief Complaint Chief Complaint  Patient presents with   Cough   Otalgia    HPI [name] is a 58 y.o. female.   The history is provided by the patient.   Patient presents for complaints of cough, runny nose, left ear pain, and generalized fatigue over the past several days.  Patient also states that her chest feels "heavy".  Denies fever, chills, ear drainage, wheezing, difficulty breathing, chest pain, abdominal pain, nausea, vomiting, diarrhea, or rash.  Patient states that she has not taken any medication for her symptoms.  Reports she was at the hospital on 3/7 for procedure.  Past Medical History:  Diagnosis Date   AICD (automatic cardioverter/defibrillator) present    Anxiety    Arthritis    Blood transfusion without reported diagnosis 1999   due to heavy menses   Breast cancer (HCC) 05/22/2011   03/01/11, Stage 2, s/p lumpectomy, chemo/xrt   CHF (congestive heart failure) (HCC)    Complication of anesthesia    pt woke up during procedure in colonoscopy/endoscopy   DDD (degenerative disc disease), lumbar    Depression 06/22/2011   Diverticulitis    DM (diabetes mellitus) (HCC) 06/22/2011   Genital warts    GERD (gastroesophageal reflux disease) 06/22/2011   Hx of adenomatous colonic polyps 10/2007   4mm sigmoid tubular adenoma, FH colon cancer, mother in mid-50s   Hypercholesterolemia 06/22/2011   Hypertension 06/22/2011   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   Iron deficiency anemia 03/25/2015   LBBB (left bundle branch block)    Mild CAD    Morbid obesity (HCC)    NICM (nonischemic cardiomyopathy) (HCC)    Personal history of chemotherapy    Personal history of radiation therapy    Presence of permanent cardiac pacemaker    PSVT (paroxysmal supraventricular tachycardia) (HCC)    Shortness of breath    STD (sexually transmitted  disease)    HPV, 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   Syncope    Syncope    Vaginal bleeding 03/08/2014    Patient Active Problem List   Diagnosis Date Noted   Postmenopausal bleeding 11/28/2023   Lumbar radiculopathy 09/07/2022   NICM (nonischemic cardiomyopathy) (HCC) 08/29/2022   CAD (coronary artery disease) 08/29/2022   Aortic atherosclerosis (HCC) 08/29/2022   Lipoma 05/16/2022   Syncope 05/02/2022   Chronic pain syndrome 05/01/2022   LBBB (left bundle branch block) 05/01/2022   Dyspepsia 08/09/2021   Constipation 03/21/2021   Diabetes mellitus without complication (HCC) 12/22/2020   Abdominal pain 12/22/2019   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   Fatty liver 09/06/2016   H/O adenomatous polyp of colon 01/24/2012   FH: colon cancer 01/24/2012   GERD (gastroesophageal reflux disease) 06/22/2011   Hyperlipidemia 06/22/2011   Essential hypertension 06/22/2011   Obesity 06/22/2011   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    Past Surgical History:  Procedure Laterality Date   back surg x2     BALLOON DILATION N/A 09/22/2021   Procedure: BALLOON DILATION;  Surgeon: Lanelle Bal, DO;  Location: [facility];  Service: Endoscopy;  Laterality: N/A;   BIOPSY  09/22/2021   Procedure: BIOPSY;  Surgeon: Lanelle Bal, DO;  [facility]: [facility];  Service: Endoscopy;;   BIOPSY  12/23/2023   Procedure: BIOPSY;  Surgeon:  Lanelle Bal, DO;  [facility]: [facility];  Service: Endoscopy;;   BIV ICD INSERTION CRT-D N/A 04/23/2023   Procedure: BIV ICD INSERTION CRT-D;  Surgeon: Maurice Small, MD;  Location[facility] INV[facility]IVE CV LAB;  Service: Cardiovascular;  Laterality: N/A;   BREAST LUMPECTOMY Right 2012   CHOLECYSTECTOMY     COLONOSCOPY  11/03/07   4-mm sessile polyp removed/small internal hemorrhoids/tubular adenoma, random colon bx negative for microscopic colitis   COLONOSCOPY WITH PROPOFOL N/A  09/17/2016   Grade 2 hemorrhoids, colonic diverticulosis, ascending colon polyp (sessile serrated adenoma), 5 year surveillance   COLONOSCOPY WITH PROPOFOL N/A 09/22/2021   Procedure: COLONOSCOPY WITH PROPOFOL;  Surgeon: Lanelle Bal, DO;  [facility]: [facility];  Service: Endoscopy;  Laterality: N/A;  7:30am   COLONOSCOPY, ESOPHAGOGASTRODUODENOSCOPY (EGD) AND ESOPHAGEAL DILATION  01/2012   mild gastritis s/p biopsy. Empiric dilation. Normal duodenum.    DILATATION & CURETTAGE/HYSTEROSCOPY WITH MYOSURE N/A 02/10/2015   Procedure: DILATATION & CURETTAGE/HYSTEROSCOPY WITH MYOSURE;  Surgeon: Patton Salles, MD;  Location: [facility];  Service: Gynecology;  Laterality: N/A;   DILATATION & CURETTAGE/HYSTEROSCOPY WITH MYOSURE N/A 01/24/2024   Procedure: DILATATION & CURETTAGE/HYSTEROSCOPY WITH MYOSURE;  Surgeon: Rosalyn Gess, MD;  Location: [facility];  Service: Gynecology;  Laterality: N/A;  [facility]   ESOPHAGOGASTRODUODENOSCOPY (EGD) WITH PROPOFOL N/A 01/11/2020   Normal esophagus, s/p dilation, normal stomach, normal duodenum.    ESOPHAGOGASTRODUODENOSCOPY (EGD) WITH PROPOFOL N/A 09/22/2021   Procedure: ESOPHAGOGASTRODUODENOSCOPY (EGD) WITH PROPOFOL;  Surgeon: Lanelle Bal, DO;  [facility];  Service: Endoscopy;  Laterality: N/A;   ESOPHAGOGASTRODUODENOSCOPY (EGD) WITH PROPOFOL N/A 12/23/2023   Procedure: ESOPHAGOGASTRODUODENOSCOPY (EGD) WITH PROPOFOL;  Surgeon: Lanelle Bal, DO;  [facility]: [facility];  Service: Endoscopy;  Laterality: N/A;  11:15 am, asa 3, pt knows to arrive at 6:15   MALONEY DILATION N/A 01/11/2020   Procedure: Elease Hashimoto DILATION;  Surgeon: Corbin Ade, MD;  [facility]: [facility];  Service: Endoscopy;  Laterality: N/A;   MM BREAST STEREO BX*L*R/S     rt.   POLYPECTOMY  09/17/2016   Procedure: POLYPECTOMY;  Surgeon: Corbin Ade, MD;  [facility]: [facility];  Service: Endoscopy;;  ascending colon   POLYPECTOMY  09/22/2021   Procedure: POLYPECTOMY;   Surgeon: Lanelle Bal, DO;  [facility];  Service: Endoscopy;;   POLYPECTOMY  12/23/2023   Procedure: POLYPECTOMY;  Surgeon: Lanelle Bal, DO;  [facility]: [facility];  Service: Endoscopy;;   PORT-A-CATH REMOVAL  05/26/2012   Procedure: REMOVAL PORT-A-CATH;  Surgeon: [name], MD;  Location: [facility];  Service: General;  Laterality: N/A;  Minor Room   PORTACATH PLACEMENT      OB History     Gravida  4   Para  3   Term  0   Preterm  0   AB  1   Living  3      SAB  0   IAB  0   Ectopic  0   Multiple      Live Births  3            Home Medications    Prior to Admission medications   Medication Sig Start Date End Date Taking? Authorizing Provider  atorvastatin (LIPITOR) 40 MG tablet TAKE 1 TABLET BY MOUTH AT BEDTIME 01/06/24   Cook, [place] G, DO  blood glucose meter kit and supplies Dispense based on patient and insurance preference. Use up  to four times daily as directed. (FOR ICD-10 E10.9, E11.9). 01/05/20   Merlyn Albert, MD  Blood Glucose Monitoring Suppl DEVI 1 each by Does not apply route 2 (two) times daily. May substitute to any manufacturer covered by patient's insurance. 07/18/23   Campbell Riches, NP  Blood Pressure KIT 1 Units by Does not apply route daily. 02/28/23   Mallipeddi, Vishnu P, MD  cetirizine (ZYRTEC) 10 MG tablet Take 1 tablet by mouth once daily 01/06/24   Cook, [place] G, DO  citalopram (CELEXA) 40 MG tablet TAKE 1 TABLET BY MOUTH EVERY DAY 05/08/23   Tommie Sams, DO  dexlansoprazole (DEXILANT) 60 MG capsule Take 1 capsule (60 mg total) by mouth daily. 12/05/23   Gelene Mink, NP  Dulaglutide (TRULICITY) 0.75 MG/0.5ML SOPN INJECT 0.75 MG SUBCUTANEOUSLY ONCE A WEEK 04/29/23   Everlene Other G, DO  DULoxetine (CYMBALTA) 30 MG capsule Take 1 capsule (30 mg total) by mouth daily. 09/06/23   Lovorn, Aundra Millet, MD  empagliflozin (JARDIANCE) 10 MG TABS tablet Take 1 tablet (10 mg total) by mouth daily before breakfast. 12/31/23    Swinyer, Zachary George, NP  ENTRESTO 24-26 MG TAKE 1 TABLET BY MOUTH TWICE A DAY 06/10/23   Jake Bathe, MD  fluticasone (FLONASE) 50 MCG/ACT nasal spray USE 2 SPRAY(S) IN EACH NOSTRIL ONCE DAILY AS NEEDED 10/16/23   Tommie Sams, DO  HYDROcodone-acetaminophen (NORCO) 5-325 MG tablet Take 1 tablet by mouth every 6 (six) hours as needed for moderate pain (pain score 4-6). Do Not Fill Before  02/ 14/2025 02/04/24   Genice Rouge, MD  metFORMIN (GLUCOPHAGE-XR) 750 MG 24 hr tablet Take 1 tablet by mouth once daily with breakfast 01/06/24   Tommie Sams, DO  metoprolol succinate (TOPROL-XL) 50 MG 24 hr tablet Take 1 tablet by mouth once daily 12/02/23   Mallipeddi, Vishnu P, MD  ondansetron (ZOFRAN-ODT) 4 MG disintegrating tablet Take 1 tablet (4 mg total) by mouth every 8 (eight) hours as needed for nausea or vomiting. Patient not taking: Reported on 01/23/2024 10/21/23   Tommie Sams, DO  ondansetron (ZOFRAN-ODT) 8 MG disintegrating tablet DISSOLVE 1 TABLET IN MOUTH EVERY 8 HOURS AS NEEDED FOR NAUSEA OR VOMITING 02/04/24   Tommie Sams, DO  Vitamin D, Ergocalciferol, (DRISDOL) 1.25 MG (50000 UNIT) CAPS capsule TAKE 1 CAPSULE BY MOUTH ONE TIME PER WEEK 04/30/23   Doreatha Massed, MD    Family History Family History  Problem Relation Age of Onset   Colon cancer Mother        mid-50s, died of metastatic disease   Cancer Mother        liver   Coronary artery disease Mother    Diabetes type I Mother    Peripheral vascular disease Mother        Carotid disease in her 15s   Coronary artery disease Father        CAD in his 85s   Diabetes Father    Diabetes type I Father    Hypertension Father    Bipolar disorder Sister    Coronary artery disease Brother        MI in his 31s   Hypertension Brother    Hypertension Maternal Grandmother    Hyperlipidemia Maternal Grandmother    Stroke Maternal Grandmother    Hypertension Maternal Grandfather    Diabetes Maternal Grandfather    Diabetes Paternal  Grandmother    Heart disease Paternal Grandfather     Social  History Social History   Tobacco Use   Smoking status: Never   Smokeless tobacco: Never  Vaping Use   Vaping status: Never Used  Substance Use Topics   Alcohol use: No    Alcohol/week: 0.0 standard drinks of alcohol   Drug use: No     Allergies   Flagyl [metronidazole], Lansoprazole, and Pravastatin   Review of Systems Review of Systems Per HPI  Physical Exam Triage Vital Signs ED Triage Vitals  Encounter Vitals Group     BP 02/06/24 1129 (!) 145/84     Systolic BP Percentile --      Diastolic BP Percentile --      Pulse Rate 02/06/24 1129 92     Resp 02/06/24 1129 18     Temp 02/06/24 1129 98.4 F (36.9 C)     Temp Source 02/06/24 1129 Oral     SpO2 02/06/24 1129 94 %     Weight --      Height --      Head Circumference --      Peak Flow --      Pain Score 02/06/24 1131 6     Pain Loc --      Pain Education --      Exclude from Growth Chart --    No data found.  Updated Vital Signs BP (!) 145/84 (BP Location: Left Arm)   Pulse 92   Temp 98.4 F (36.9 C) (Oral)   Resp 18   LMP 04/05/2016 Comment: spotting 11/2016 and 12/2016  SpO2 94%   Visual Acuity Right Eye Distance:   Left Eye Distance:   Bilateral Distance:    Right Eye Near:   Left Eye Near:    Bilateral Near:     Physical Exam Vitals and nursing note reviewed.  Constitutional:      General: She is not in acute distress.    Appearance: Normal appearance.  HENT:     Head: Normocephalic.     Right Ear: Tympanic membrane, ear canal and external ear normal.     Left Ear: Tympanic membrane, ear canal and external ear normal.     Nose: Congestion present.     Right Turbinates: Enlarged and swollen.     Left Turbinates: Enlarged and swollen.     Right Sinus: No maxillary sinus tenderness or frontal sinus tenderness.     Left Sinus: No maxillary sinus tenderness or frontal sinus tenderness.     Mouth/Throat:     Lips: Pink.      Mouth: Mucous membranes are moist.     Pharynx: Uvula midline. Posterior oropharyngeal erythema and postnasal drip present. No pharyngeal swelling, oropharyngeal exudate or uvula swelling.     Comments: Cobblestoning present to posterior oropharynx  Eyes:     Extraocular Movements: Extraocular movements intact.     Conjunctiva/sclera: Conjunctivae normal.     Pupils: Pupils are equal, round, and reactive to light.  Cardiovascular:     Rate and Rhythm: Normal rate and regular rhythm.     Pulses: Normal pulses.     Heart sounds: Normal heart sounds.  Pulmonary:     Effort: Pulmonary effort is normal. No respiratory distress.     Breath sounds: Normal breath sounds. No stridor. No wheezing, rhonchi or rales.  Abdominal:     General: Bowel sounds are normal.     Palpations: Abdomen is soft.     Tenderness: There is no abdominal tenderness.  Musculoskeletal:     Cervical back: Normal  range of motion.  Lymphadenopathy:     Cervical: No cervical adenopathy.  Skin:    General: Skin is warm and dry.  Neurological:     General: No focal deficit present.     Mental Status: She is alert and oriented to person, place, and time.  Psychiatric:        Mood and Affect: Mood normal.        Behavior: Behavior normal.      UC Treatments / Results  Labs (all labs ordered are listed, but only abnormal results are displayed) Labs Reviewed  POC COVID19/FLU A&B COMBO    EKG   Radiology No results found.  Procedures Procedures (including critical care time)  Medications Ordered in UC Medications - No data to display  Initial Impression / Assessment and Plan / UC Course  I have reviewed the triage vital signs and the nursing notes.  Pertinent labs & imaging results that were available during my care of the patient were reviewed by me and considered in my medical decision making (see chart for details).  COVID/flu test was negative.  Symptoms consistent with a viral URI with cough.   Will provide symptomatic treatment with Promethazine DM for the cough, fluticasone 50 mcg nasal spray for nasal congestion and runny nose, and cetirizine-D 5/120 mg tablets.  Supportive care recommendations were provided and discussed with the patient to include fluids, rest, over-the-counter analgesics, and use of normal saline nasal spray.  Discussed indications with patient regarding follow-up.  Patient was in agreement with this plan of care and verbalized understanding.  All questions were answered.  Patient stable for discharge.  Final Clinical Impressions(s) / UC Diagnoses   Final diagnoses:  None   Discharge Instructions   None    ED Prescriptions   None    PDMP not reviewed this encounter.   Abran Cantor, NP 02/06/24 1148

## 2024-02-06 NOTE — ED Triage Notes (Addendum)
 Had D&C done on 3/7.  Productive cough, runny nose and left ear pain and weakness since Monday.  States chest feels heavy

## 2024-02-06 NOTE — Discharge Instructions (Signed)
 Take medication as prescribed. Increase fluids and allow for plenty of rest. May take over-the-counter Tylenol as needed for pain, fever, or general discomfort. Recommend normal saline nasal spray throughout the day for nasal congestion and runny nose. For your cough, recommend use of a humidifier in your bedroom at nighttime during sleep and sleeping elevated on pillows while symptoms persist. If symptoms fail to improve over the next 5 to 7 days, or appear to be worsening, you may follow-up in this clinic or with your primary care physician for further evaluation. Follow-up as needed.

## 2024-02-07 ENCOUNTER — Other Ambulatory Visit: Payer: Self-pay

## 2024-02-07 DIAGNOSIS — J302 Other seasonal allergic rhinitis: Secondary | ICD-10-CM

## 2024-02-07 MED ORDER — CETIRIZINE HCL 10 MG PO TABS: 10.0000 mg | ORAL_TABLET | Freq: Every day | ORAL | 0 refills | Status: DC

## 2024-02-11 ENCOUNTER — Encounter: Payer: Medicaid Other | Admitting: Obstetrics and Gynecology

## 2024-02-11 NOTE — Progress Notes (Deleted)
 58 y.o. [phone] female with postmenopausal bleeding, nonischemic cardiomyopathy, left bundle branch block (pacemaker placed summer 2024), coronary artery disease, hypertension, type 2 diabetes, GERD, chronic back and leg pain, obesity 2wk s/p Diagnostic hysteroscopy, dilation and curettage, polypectomy here for postoperative exam. Married.  Works as [employer], Water engineer. Patient of Dr. Rica Records.  Patient's last menstrual period was 04/05/2016.  01/24/24 pathology: A.  ENDOMETRIAL POLYP:  Inactive endometrium.  Benign endometrial polyp.  Negative for endometrial intraepithelial neoplasia (EIN) and malignancy.   B. ENDOMETRIUM, CURETTAGE:  Inactive endometrium.  Negative for endometrial intraepithelial neoplasia (EIN) and malignancy.   Patient called 01/27/24 with ongoing bleeding ***  At prior appts, she notes: New onset bleeding started around the beginning of December.  No other bleeding issues since menopause. She was started on Provera and the bleeding initially improved however she called GYN on 11/01/2023 with heavier bleeding. Provera was increased to 20 mg daily on 11/05/2023.  Today she reports no additional bleeding since the increase in dosing.  She notes intermittent cramping. Taking norco for back and leg, also using for moderate pelvic cramping.  She cannot take NSAIDs due to GERD.  Preop Work-up: 10/24/2023: Transvaginal ultrasound with thickened endometrium 6 mm, 8 cm uterus with no fibroids or other masses.  Normal bilateral ovaries. 10/30/2023: Benign EMB 2/21 increased to 20mg  BID provera due to ongoing bleeding No bleeding since  GYN HISTORY: Hx of LEEP 2005-2006, and laser ablation prior to LEEP D&C for AUB-F x2, 2016, 2020 History of right breast cancer, diagnosed in 2012 status postlumpectomy  OB History  Gravida Para Term Preterm AB Living  4 3 0 0 1 3  SAB IAB Ectopic Multiple Live Births  0 0 0  3    # Outcome Date GA Lbr Len/2nd Weight Sex Type Anes  PTL Lv  4 AB           3 Para         LIV  2 Para         LIV  1 Para         LIV    Past Medical History:  Diagnosis Date   AICD (automatic cardioverter/defibrillator) present    Anxiety    Arthritis    Blood transfusion without reported diagnosis 1999   due to heavy menses   Breast cancer (HCC) 05/22/2011   03/01/11, Stage 2, s/p lumpectomy, chemo/xrt   CHF (congestive heart failure) (HCC)    Complication of anesthesia    pt woke up during procedure in colonoscopy/endoscopy   DDD (degenerative disc disease), lumbar    Depression 06/22/2011   Diverticulitis    DM (diabetes mellitus) (HCC) 06/22/2011   Genital warts    GERD (gastroesophageal reflux disease) 06/22/2011   Hx of adenomatous colonic polyps 10/2007   4mm sigmoid tubular adenoma, FH colon cancer, mother in mid-50s   Hypercholesterolemia 06/22/2011   Hypertension 06/22/2011   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   Iron deficiency anemia 03/25/2015   LBBB (left bundle branch block)    Mild CAD    Morbid obesity (HCC)    NICM (nonischemic cardiomyopathy) (HCC)    Personal history of chemotherapy    Personal history of radiation therapy    Presence of permanent cardiac pacemaker    PSVT (paroxysmal supraventricular tachycardia) (HCC)    Shortness of breath    STD (sexually transmitted disease)    HPV, 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   Syncope  Syncope    Vaginal bleeding 03/08/2014    Past Surgical History:  Procedure Laterality Date   back surg x2     BALLOON DILATION N/A 09/22/2021   Procedure: BALLOON DILATION;  Surgeon: Lanelle Bal, DO;  [facility];  Service: Endoscopy;  Laterality: N/A;   BIOPSY  09/22/2021   Procedure: BIOPSY;  Surgeon: Lanelle Bal, DO;  [facility];  Service: Endoscopy;;   BIOPSY  12/23/2023   Procedure: BIOPSY;  Surgeon: Lanelle Bal, DO;  [facility];  Service: Endoscopy;;   BIV ICD INSERTION CRT-D N/A  04/23/2023   Procedure: BIV ICD INSERTION CRT-D;  Surgeon: Maurice Small, MD;  Location: [facility] INVASIVE CV LAB;  Service: Cardiovascular;  Laterality: N/A;   BREAST LUMPECTOMY Right 2012   CHOLECYSTECTOMY     COLONOSCOPY  11/03/07   4-mm sessile polyp removed/small internal hemorrhoids/tubular adenoma, random colon bx negative for microscopic colitis   COLONOSCOPY WITH PROPOFOL N/A 09/17/2016   Grade 2 hemorrhoids, colonic diverticulosis, ascending colon polyp (sessile serrated adenoma), 5 year surveillance   COLONOSCOPY WITH PROPOFOL N/A 09/22/2021   Procedure: COLONOSCOPY WITH PROPOFOL;  Surgeon: Lanelle Bal, DO;  [facility];  Service: Endoscopy;  Laterality: N/A;  7:30am   COLONOSCOPY, ESOPHAGOGASTRODUODENOSCOPY (EGD) AND ESOPHAGEAL DILATION  01/2012   mild gastritis s/p biopsy. Empiric dilation. Normal duodenum.    DILATATION & CURETTAGE/HYSTEROSCOPY WITH MYOSURE N/A 02/10/2015   Procedure: DILATATION & CURETTAGE/HYSTEROSCOPY WITH MYOSURE;  Surgeon: Patton Salles, MD;  Location: [facility];  Service: Gynecology;  Laterality: N/A;   DILATATION & CURETTAGE/HYSTEROSCOPY WITH MYOSURE N/A 01/24/2024   Procedure: DILATATION & CURETTAGE/HYSTEROSCOPY WITH MYOSURE;  Surgeon: Rosalyn Gess, MD;  Location: [facility];  Service: Gynecology;  Laterality: N/A;  [facility]   ESOPHAGOGASTRODUODENOSCOPY (EGD) WITH PROPOFOL N/A 01/11/2020   Normal esophagus, s/p dilation, normal stomach, normal duodenum.    ESOPHAGOGASTRODUODENOSCOPY (EGD) WITH PROPOFOL N/A 09/22/2021   Procedure: ESOPHAGOGASTRODUODENOSCOPY (EGD) WITH PROPOFOL;  Surgeon: Lanelle Bal, DO;  [facility];  Service: Endoscopy;  Laterality: N/A;   ESOPHAGOGASTRODUODENOSCOPY (EGD) WITH PROPOFOL N/A 12/23/2023   Procedure: ESOPHAGOGASTRODUODENOSCOPY (EGD) WITH PROPOFOL;  Surgeon: Lanelle Bal, DO;  [facility];  Service: Endoscopy;  Laterality: N/A;  11:15 am, asa 3, pt knows to arrive at 6:15   MALONEY  DILATION N/A 01/11/2020   Procedure: Elease Hashimoto DILATION;  Surgeon: Corbin Ade, MD;  [facility];  Service: Endoscopy;  Laterality: N/A;   MM BREAST STEREO BX*L*R/S     rt.   POLYPECTOMY  09/17/2016   Procedure: POLYPECTOMY;  Surgeon: Corbin Ade, MD;  [facility];  Service: Endoscopy;;  ascending colon   POLYPECTOMY  09/22/2021   Procedure: POLYPECTOMY;  Surgeon: Lanelle Bal, DO;  [facility];  Service: Endoscopy;;   POLYPECTOMY  12/23/2023   Procedure: POLYPECTOMY;  Surgeon: Lanelle Bal, DO;  [facility];  Service: Endoscopy;;   PORT-A-CATH REMOVAL  05/26/2012   Procedure: REMOVAL PORT-A-CATH;  Surgeon: [name], MD;  Location: [facility];  Service: General;  Laterality: N/A;  Minor Room   PORTACATH PLACEMENT      Current Outpatient Medications on File Prior to Visit  Medication Sig Dispense Refill   atorvastatin (LIPITOR) 40 MG tablet TAKE 1 TABLET BY MOUTH AT BEDTIME 90 tablet 0   blood glucose meter kit and supplies Dispense based on patient and insurance preference.  Use up to four times daily as directed. (FOR ICD-10 E10.9, E11.9). 1 each 5   Blood Glucose Monitoring Suppl DEVI 1 each by Does not apply route 2 (two) times daily. May substitute to any manufacturer covered by patient's insurance. 1 each 0   Blood Pressure KIT 1 Units by Does not apply route daily. 1 kit 0   cetirizine (ZYRTEC) 10 MG tablet Take 1 tablet (10 mg total) by mouth daily. 60 tablet 0   cetirizine-pseudoephedrine (ZYRTEC-D) 5-120 MG tablet Take 1 tablet by mouth daily. 30 tablet 0   citalopram (CELEXA) 40 MG tablet TAKE 1 TABLET BY MOUTH EVERY DAY 90 tablet 2   dexlansoprazole (DEXILANT) 60 MG capsule Take 1 capsule (60 mg total) by mouth daily. 90 capsule 3   Dulaglutide (TRULICITY) 0.75 MG/0.5ML SOPN INJECT 0.75 MG SUBCUTANEOUSLY ONCE A WEEK 6 mL 5   DULoxetine (CYMBALTA) 30 MG capsule Take 1 capsule (30 mg total) by mouth daily. 90 capsule 1    empagliflozin (JARDIANCE) 10 MG TABS tablet Take 1 tablet (10 mg total) by mouth daily before breakfast. 90 tablet 0   ENTRESTO 24-26 MG TAKE 1 TABLET BY MOUTH TWICE A DAY 60 tablet 11   fluticasone (FLONASE) 50 MCG/ACT nasal spray Place 2 sprays into both nostrils daily. 16 g 0   HYDROcodone-acetaminophen (NORCO) 5-325 MG tablet Take 1 tablet by mouth every 6 (six) hours as needed for moderate pain (pain score 4-6). Do Not Fill Before  02/ 14/2025 120 tablet 0   metFORMIN (GLUCOPHAGE-XR) 750 MG 24 hr tablet Take 1 tablet by mouth once daily with breakfast 90 tablet 0   metoprolol succinate (TOPROL-XL) 50 MG 24 hr tablet Take 1 tablet by mouth once daily 90 tablet 3   ondansetron (ZOFRAN-ODT) 4 MG disintegrating tablet Take 1 tablet (4 mg total) by mouth every 8 (eight) hours as needed for nausea or vomiting. (Patient not taking: Reported on 01/23/2024) 20 tablet 0   ondansetron (ZOFRAN-ODT) 8 MG disintegrating tablet DISSOLVE 1 TABLET IN MOUTH EVERY 8 HOURS AS NEEDED FOR NAUSEA OR VOMITING 20 tablet 0   promethazine-dextromethorphan (PROMETHAZINE-DM) 6.25-15 MG/5ML syrup Take 5 mLs by mouth 4 (four) times daily as needed. 118 mL 0   Vitamin D, Ergocalciferol, (DRISDOL) 1.25 MG (50000 UNIT) CAPS capsule TAKE 1 CAPSULE BY MOUTH ONE TIME PER WEEK 12 capsule 3   No current facility-administered medications on file prior to visit.    Allergies  Allergen Reactions   Flagyl [Metronidazole] Hives, Itching and Swelling   Lansoprazole Other (See Comments)    Stomach pain   Pravastatin Other (See Comments)    Muscle aches      PE There were no vitals filed for this visit.   There is no height or weight on file to calculate BMI.  Physical Exam Vitals reviewed.  Constitutional:      General: She is not in acute distress.    Appearance: Normal appearance.  HENT:     Head: Normocephalic and atraumatic.     Nose: Nose normal.  Eyes:     Extraocular Movements: Extraocular movements intact.      Conjunctiva/sclera: Conjunctivae normal.  Pulmonary:     Effort: Pulmonary effort is normal.  Musculoskeletal:        General: Normal range of motion.     Cervical back: Normal range of motion.  Neurological:     General: No focal deficit present.     Mental Status: She is alert.  Psychiatric:  Mood and Affect: Mood normal.        Behavior: Behavior normal.       Assessment and Plan:        There are no diagnoses linked to this encounter.     Rosalyn Gess, MD

## 2024-02-18 ENCOUNTER — Encounter: Payer: Self-pay | Admitting: Obstetrics and Gynecology

## 2024-02-18 ENCOUNTER — Ambulatory Visit (INDEPENDENT_AMBULATORY_CARE_PROVIDER_SITE_OTHER): Admitting: Obstetrics and Gynecology

## 2024-02-18 VITALS — BP 118/62 | HR 90 | Temp 98.6°F | Wt 238.0 lb

## 2024-02-18 DIAGNOSIS — N840 Polyp of corpus uteri: Secondary | ICD-10-CM

## 2024-02-18 DIAGNOSIS — N950 Postmenopausal bleeding: Secondary | ICD-10-CM

## 2024-02-18 NOTE — Progress Notes (Signed)
 58 y.o. [phone] female with postmenopausal bleeding, nonischemic cardiomyopathy, left bundle branch block (pacemaker placed summer 2024), coronary artery disease, hypertension, type 2 diabetes, GERD, chronic back and leg pain, obesity who presents 3 weeks status post diagnostic hysteroscopy, polypectomy, D&C for postmenopausal bleeding. Married.  Works as [employer], Water engineer.  Patient's last menstrual period was 04/05/2016.   New onset bleeding started around the beginning of December.  No other bleeding issues since menopause.  Bleeding remain poorly controlled with Provera throughout her workup.  01/24/2024 pathology: A.  ENDOMETRIAL POLYP:  Inactive endometrium.  Benign endometrial polyp.  Negative for endometrial intraepithelial neoplasia (EIN) and malignancy.   B. ENDOMETRIUM, CURETTAGE:  Inactive endometrium.  Negative for endometrial intraepithelial neoplasia (EIN) and malignancy   Pt states she is doing well. Denies N/V, abdominal pain, VB, dysuria. Normal BM (chronic constipation) and voids.  Has restarted diabetes medications.  GYN HISTORY: Hx of LEEP 2005-2006, and laser ablation prior to LEEP D&C for AUB-F x2, 2016, 2020 History of right breast cancer, diagnosed in 2012 status postlumpectomy  OB History  Gravida Para Term Preterm AB Living  4 3 0 0 1 3  SAB IAB Ectopic Multiple Live Births  0 0 0  3    # Outcome Date GA Lbr Len/2nd Weight Sex Type Anes PTL Lv  4 AB           3 Para         LIV  2 Para         LIV  1 Para         LIV    Past Medical History:  Diagnosis Date   AICD (automatic cardioverter/defibrillator) present    Anxiety    Arthritis    Blood transfusion without reported diagnosis 1999   due to heavy menses   Breast cancer (HCC) 05/22/2011   03/01/11, Stage 2, s/p lumpectomy, chemo/xrt   CHF (congestive heart failure) (HCC)    Complication of anesthesia    pt woke up during procedure in colonoscopy/endoscopy   DDD (degenerative disc  disease), lumbar    Depression 06/22/2011   Diverticulitis    DM (diabetes mellitus) (HCC) 06/22/2011   Genital warts    GERD (gastroesophageal reflux disease) 06/22/2011   Hx of adenomatous colonic polyps 10/2007   4mm sigmoid tubular adenoma, FH colon cancer, mother in mid-50s   Hypercholesterolemia 06/22/2011   Hypertension 06/22/2011   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   Iron deficiency anemia 03/25/2015   LBBB (left bundle branch block)    Mild CAD    Morbid obesity (HCC)    NICM (nonischemic cardiomyopathy) (HCC)    Personal history of chemotherapy    Personal history of radiation therapy    Presence of permanent cardiac pacemaker    PSVT (paroxysmal supraventricular tachycardia) (HCC)    Shortness of breath    STD (sexually transmitted disease)    HPV, 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   Syncope    Syncope    Vaginal bleeding 03/08/2014    Past Surgical History:  Procedure Laterality Date   back surg x2     BALLOON DILATION N/A 09/22/2021   Procedure: BALLOON DILATION;  Surgeon: Lanelle Bal, DO;  [facility];  Service: Endoscopy;  Laterality: N/A;   BIOPSY  09/22/2021   Procedure: BIOPSY;  Surgeon: Lanelle Bal, DO;  [facility];  Service: Endoscopy;;   BIOPSY  12/23/2023   Procedure: BIOPSY;  Surgeon:  Lanelle Bal, DO;  [facility];  Service: Endoscopy;;   BIV ICD INSERTION CRT-D N/A 04/23/2023   Procedure: BIV ICD INSERTION CRT-D;  Surgeon: Maurice Small, MD;  Location: [facility] INVASIVE CV LAB;  Service: Cardiovascular;  Laterality: N/A;   BREAST LUMPECTOMY Right 2012   CHOLECYSTECTOMY     COLONOSCOPY  11/03/07   4-mm sessile polyp removed/small internal hemorrhoids/tubular adenoma, random colon bx negative for microscopic colitis   COLONOSCOPY WITH PROPOFOL N/A 09/17/2016   Grade 2 hemorrhoids, colonic diverticulosis, ascending colon polyp (sessile serrated adenoma), 5 year surveillance    COLONOSCOPY WITH PROPOFOL N/A 09/22/2021   Procedure: COLONOSCOPY WITH PROPOFOL;  Surgeon: Lanelle Bal, DO;  [facility];  Service: Endoscopy;  Laterality: N/A;  7:30am   COLONOSCOPY, ESOPHAGOGASTRODUODENOSCOPY (EGD) AND ESOPHAGEAL DILATION  01/2012   mild gastritis s/p biopsy. Empiric dilation. Normal duodenum.    DILATATION & CURETTAGE/HYSTEROSCOPY WITH MYOSURE N/A 02/10/2015   Procedure: DILATATION & CURETTAGE/HYSTEROSCOPY WITH MYOSURE;  Surgeon: Patton Salles, MD;  Location: [facility];  Service: Gynecology;  Laterality: N/A;   DILATATION & CURETTAGE/HYSTEROSCOPY WITH MYOSURE N/A 01/24/2024   Procedure: DILATATION & CURETTAGE/HYSTEROSCOPY WITH MYOSURE;  Surgeon: Rosalyn Gess, MD;  Location: [facility];  Service: Gynecology;  Laterality: N/A;  [facility]   ESOPHAGOGASTRODUODENOSCOPY (EGD) WITH PROPOFOL N/A 01/11/2020   Normal esophagus, s/p dilation, normal stomach, normal duodenum.    ESOPHAGOGASTRODUODENOSCOPY (EGD) WITH PROPOFOL N/A 09/22/2021   Procedure: ESOPHAGOGASTRODUODENOSCOPY (EGD) WITH PROPOFOL;  Surgeon: Lanelle Bal, DO;  [facility];  Service: Endoscopy;  Laterality: N/A;   ESOPHAGOGASTRODUODENOSCOPY (EGD) WITH PROPOFOL N/A 12/23/2023   Procedure: ESOPHAGOGASTRODUODENOSCOPY (EGD) WITH PROPOFOL;  Surgeon: Lanelle Bal, DO;  [facility];  Service: Endoscopy;  Laterality: N/A;  11:15 am, asa 3, pt knows to arrive at 6:15   MALONEY DILATION N/A 01/11/2020   Procedure: Elease Hashimoto DILATION;  Surgeon: Corbin Ade, MD;  [facility];  Service: Endoscopy;  Laterality: N/A;   MM BREAST STEREO BX*L*R/S     rt.   POLYPECTOMY  09/17/2016   Procedure: POLYPECTOMY;  Surgeon: Corbin Ade, MD;  [facility];  Service: Endoscopy;;  ascending colon   POLYPECTOMY  09/22/2021   Procedure: POLYPECTOMY;  Surgeon: Lanelle Bal, DO;  [facility];  Service: Endoscopy;;   POLYPECTOMY  12/23/2023   Procedure: POLYPECTOMY;   Surgeon: Lanelle Bal, DO;  [facility];  Service: Endoscopy;;   PORT-A-CATH REMOVAL  05/26/2012   Procedure: REMOVAL PORT-A-CATH;  Surgeon: [name], MD;  Location: [facility];  Service: General;  Laterality: N/A;  Minor Room   PORTACATH PLACEMENT      Current Outpatient Medications on File Prior to Visit  Medication Sig Dispense Refill   atorvastatin (LIPITOR) 40 MG tablet TAKE 1 TABLET BY MOUTH AT BEDTIME 90 tablet 0   blood glucose meter kit and supplies Dispense based on patient and insurance preference. Use up to four times daily as directed. (FOR ICD-10 E10.9, E11.9). 1 each 5   Blood Glucose Monitoring Suppl DEVI 1 each by Does not apply route 2 (two) times daily. May substitute to any manufacturer covered by patient's insurance. 1 each 0   Blood Pressure KIT 1 Units by Does not apply route daily. 1 kit 0   cetirizine (ZYRTEC) 10 MG tablet Take 1 tablet (10 mg total) by mouth daily. 60 tablet 0   cetirizine-pseudoephedrine (ZYRTEC-D) 5-120 MG  tablet Take 1 tablet by mouth daily. 30 tablet 0   citalopram (CELEXA) 40 MG tablet TAKE 1 TABLET BY MOUTH EVERY DAY 90 tablet 2   dexlansoprazole (DEXILANT) 60 MG capsule Take 1 capsule (60 mg total) by mouth daily. 90 capsule 3   Dulaglutide (TRULICITY) 0.75 MG/0.5ML SOPN INJECT 0.75 MG SUBCUTANEOUSLY ONCE A WEEK 6 mL 5   DULoxetine (CYMBALTA) 30 MG capsule Take 1 capsule (30 mg total) by mouth daily. 90 capsule 1   empagliflozin (JARDIANCE) 10 MG TABS tablet Take 1 tablet (10 mg total) by mouth daily before breakfast. 90 tablet 0   ENTRESTO 24-26 MG TAKE 1 TABLET BY MOUTH TWICE A DAY 60 tablet 11   fluticasone (FLONASE) 50 MCG/ACT nasal spray Place 2 sprays into both nostrils daily. 16 g 0   HYDROcodone-acetaminophen (NORCO) 5-325 MG tablet Take 1 tablet by mouth every 6 (six) hours as needed for moderate pain (pain score 4-6). Do Not Fill Before  02/ 14/2025 120 tablet 0   metFORMIN (GLUCOPHAGE-XR) 750 MG 24 hr tablet Take 1  tablet by mouth once daily with breakfast 90 tablet 0   metoprolol succinate (TOPROL-XL) 50 MG 24 hr tablet Take 1 tablet by mouth once daily 90 tablet 3   ondansetron (ZOFRAN-ODT) 8 MG disintegrating tablet DISSOLVE 1 TABLET IN MOUTH EVERY 8 HOURS AS NEEDED FOR NAUSEA OR VOMITING 20 tablet 0   Vitamin D, Ergocalciferol, (DRISDOL) 1.25 MG (50000 UNIT) CAPS capsule TAKE 1 CAPSULE BY MOUTH ONE TIME PER WEEK 12 capsule 3   No current facility-administered medications on file prior to visit.    Allergies  Allergen Reactions   Flagyl [Metronidazole] Hives, Itching and Swelling   Lansoprazole Other (See Comments)    Stomach pain   Pravastatin Other (See Comments)    Muscle aches      PE Today's Vitals   02/18/24 1423  BP: 118/62  Pulse: 90  Temp: 98.6 F (37 C)  TempSrc: Oral  SpO2: 96%  Weight: 238 lb (108 kg)    Body mass index is 40.85 kg/m.  Physical Exam Vitals reviewed.  Constitutional:      General: She is not in acute distress.    Appearance: Normal appearance.  HENT:     Head: Normocephalic and atraumatic.     Nose: Nose normal.  Eyes:     Extraocular Movements: Extraocular movements intact.     Conjunctiva/sclera: Conjunctivae normal.  Pulmonary:     Effort: Pulmonary effort is normal.  Musculoskeletal:        General: Normal range of motion.     Cervical back: Normal range of motion.  Neurological:     General: No focal deficit present.     Mental Status: She is alert.  Psychiatric:        Mood and Affect: Mood normal.        Behavior: Behavior normal.      Assessment and Plan:        Postmenopausal bleeding  Endometrial polyp  Benign polyp on pathology.  Recovering well after surgery. Return office for routine annual exam. Start stool softener for constipation.  Rosalyn Gess, MD

## 2024-02-29 DIAGNOSIS — Z419 Encounter for procedure for purposes other than remedying health state, unspecified: Secondary | ICD-10-CM | POA: Diagnosis not present

## 2024-03-03 ENCOUNTER — Telehealth: Payer: Self-pay | Admitting: Physical Medicine and Rehabilitation

## 2024-03-03 NOTE — Telephone Encounter (Signed)
 P left vm for refill of hydrocodone

## 2024-03-04 ENCOUNTER — Other Ambulatory Visit: Payer: Self-pay | Admitting: Family Medicine

## 2024-03-04 ENCOUNTER — Telehealth: Payer: Self-pay | Admitting: Physical Medicine and Rehabilitation

## 2024-03-04 NOTE — Telephone Encounter (Signed)
 P left another vm today to see if her hydrocodone was refilled yet   Hydrocodone  [facility]

## 2024-03-05 ENCOUNTER — Other Ambulatory Visit: Payer: Self-pay

## 2024-03-05 ENCOUNTER — Encounter: Payer: Self-pay | Admitting: *Deleted

## 2024-03-05 ENCOUNTER — Ambulatory Visit: Payer: Medicaid Other | Admitting: Gastroenterology

## 2024-03-05 ENCOUNTER — Other Ambulatory Visit: Payer: Self-pay | Admitting: Physical Medicine and Rehabilitation

## 2024-03-05 MED ORDER — HYDROCODONE-ACETAMINOPHEN 5-325 MG PO TABS: 1.0000 | ORAL_TABLET | Freq: Four times a day (QID) | ORAL | 0 refills | Status: DC | PRN

## 2024-03-05 MED ORDER — ONDANSETRON 8 MG PO TBDP: ORAL_TABLET | ORAL | 0 refills | Status: DC

## 2024-03-10 NOTE — Addendum Note (Signed)
 Addended by: Lott Rouleau A on: 03/10/2024 08:51 AM   Modules accepted: Orders

## 2024-03-10 NOTE — Progress Notes (Signed)
 Remote ICD transmission.

## 2024-03-12 ENCOUNTER — Encounter: Payer: Self-pay | Admitting: Internal Medicine

## 2024-03-12 ENCOUNTER — Ambulatory Visit: Payer: Medicaid Other | Attending: Internal Medicine | Admitting: Internal Medicine

## 2024-03-12 VITALS — BP 122/68 | HR 89 | Ht 64.0 in | Wt 236.0 lb

## 2024-03-12 DIAGNOSIS — I428 Other cardiomyopathies: Secondary | ICD-10-CM

## 2024-03-12 DIAGNOSIS — G4733 Obstructive sleep apnea (adult) (pediatric): Secondary | ICD-10-CM

## 2024-03-12 DIAGNOSIS — R5383 Other fatigue: Secondary | ICD-10-CM | POA: Insufficient documentation

## 2024-03-12 MED ORDER — SPIRONOLACTONE 25 MG PO TABS: 12.5000 mg | ORAL_TABLET | Freq: Every day | ORAL | 3 refills | Status: DC

## 2024-03-12 NOTE — Progress Notes (Signed)
 Cardiology Office Note  Date: 03/12/2024   ID: Marwah, Disbro [birth year], MRN [id number]  PCP:  Myrna Ast, DO  Cardiologist:  Lasalle Pointer, MD Electrophysiologist:  Efraim Grange, MD   Reason for Office Visit: NICM follow-up   History of Present Illness: [name] is a 58 y.o. female known to have NICM LVEF 34% with no device, mild CAD, history of breast cancer s/p lumpectomy, chemo/XRT, HLD presented to the cardiology clinic for follow-up visit.  S/p CRT-D in June 2024. Doing great.  No DOE, angina.  Dizziness improved.  No leg swelling.  She reported feeling severely tired recently.  Reviewed medications, did not start any new medications the last couple of months.  Reviewed labs, vitamin D , iron and ferritin within normal limits.  She was never tested for OSA.  She reported having nausea and has GERD, instructed her to follow-up with PCP.  Past Medical History:  Diagnosis Date   AICD (automatic cardioverter/defibrillator) present    Anxiety    Arthritis    Blood transfusion without reported diagnosis 1999   due to heavy menses   Breast cancer (HCC) 05/22/2011   03/01/11, Stage 2, s/p lumpectomy, chemo/xrt   CHF (congestive heart failure) (HCC)    Complication of anesthesia    pt woke up during procedure in colonoscopy/endoscopy   DDD (degenerative disc disease), lumbar    Depression 06/22/2011   Diverticulitis    DM (diabetes mellitus) (HCC) 06/22/2011   Genital warts    GERD (gastroesophageal reflux disease) 06/22/2011   Hx of adenomatous colonic polyps 10/2007   4mm sigmoid tubular adenoma, FH colon cancer, mother in mid-50s   Hypercholesterolemia 06/22/2011   Hypertension 06/22/2011   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   Iron deficiency anemia 03/25/2015   LBBB (left bundle branch block)    Mild CAD    Morbid obesity (HCC)    NICM (nonischemic cardiomyopathy) (HCC)    Personal history of chemotherapy    Personal  history of radiation therapy    Presence of permanent cardiac pacemaker    PSVT (paroxysmal supraventricular tachycardia) (HCC)    Shortness of breath    STD (sexually transmitted disease)    HPV, 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   Syncope    Syncope    Vaginal bleeding 03/08/2014    Past Surgical History:  Procedure Laterality Date   back surg x2     BALLOON DILATION N/A 09/22/2021   Procedure: BALLOON DILATION;  Surgeon: Vinetta Greening, DO;  [facility];  Service: Endoscopy;  Laterality: N/A;   BIOPSY  09/22/2021   Procedure: BIOPSY;  Surgeon: Vinetta Greening, DO;  [facility];  Service: Endoscopy;;   BIOPSY  12/23/2023   Procedure: BIOPSY;  Surgeon: Vinetta Greening, DO;  [facility];  Service: Endoscopy;;   BIV ICD INSERTION CRT-D N/A 04/23/2023   Procedure: BIV ICD INSERTION CRT-D;  Surgeon: Efraim Grange, MD;  Location: [facility];  Service: Cardiovascular;  Laterality: N/A;   BREAST LUMPECTOMY Right 2012   CHOLECYSTECTOMY     COLONOSCOPY  11/03/07   4-mm sessile polyp removed/small internal hemorrhoids/tubular adenoma, random colon bx negative for microscopic colitis   COLONOSCOPY WITH PROPOFOL  N/A 09/17/2016   Grade 2 hemorrhoids, colonic diverticulosis, ascending colon polyp (sessile serrated adenoma), 5 year surveillance   COLONOSCOPY WITH PROPOFOL  N/A 09/22/2021   Procedure: COLONOSCOPY WITH PROPOFOL ;  Surgeon: Vinetta Greening, DO;  [facility];  Service: Endoscopy;  Laterality: N/A;  7:30am   COLONOSCOPY, ESOPHAGOGASTRODUODENOSCOPY (EGD) AND ESOPHAGEAL DILATION  01/2012   mild gastritis s/p biopsy. Empiric dilation. Normal duodenum.    DILATATION & CURETTAGE/HYSTEROSCOPY WITH MYOSURE N/A 02/10/2015   Procedure: DILATATION & CURETTAGE/HYSTEROSCOPY WITH MYOSURE;  Surgeon: Greta Leatherwood, MD;  Location: [facility];  Service: Gynecology;  Laterality: N/A;   DILATATION & CURETTAGE/HYSTEROSCOPY WITH MYOSURE N/A  01/24/2024   Procedure: DILATATION & CURETTAGE/HYSTEROSCOPY WITH MYOSURE;  Surgeon: Romaine Closs, MD;  Location: [facility];  Service: Gynecology;  Laterality: N/A;  [facility]   ESOPHAGOGASTRODUODENOSCOPY (EGD) WITH PROPOFOL  N/A 01/11/2020   Normal esophagus, s/p dilation, normal stomach, normal duodenum.    ESOPHAGOGASTRODUODENOSCOPY (EGD) WITH PROPOFOL  N/A 09/22/2021   Procedure: ESOPHAGOGASTRODUODENOSCOPY (EGD) WITH PROPOFOL ;  Surgeon: Vinetta Greening, DO;  [facility];  Service: Endoscopy;  Laterality: N/A;   ESOPHAGOGASTRODUODENOSCOPY (EGD) WITH PROPOFOL  N/A 12/23/2023   Procedure: ESOPHAGOGASTRODUODENOSCOPY (EGD) WITH PROPOFOL ;  Surgeon: Vinetta Greening, DO;  [facility];  Service: Endoscopy;  Laterality: N/A;  11:15 am, asa 3, pt knows to arrive at 6:15   MALONEY DILATION N/A 01/11/2020   Procedure: Londa Rival DILATION;  Surgeon: Suzette Espy, MD;  [facility];  Service: Endoscopy;  Laterality: N/A;   MM BREAST STEREO BX*L*R/S     rt.   POLYPECTOMY  09/17/2016   Procedure: POLYPECTOMY;  Surgeon: Suzette Espy, MD;  [facility];  Service: Endoscopy;;  ascending colon   POLYPECTOMY  09/22/2021   Procedure: POLYPECTOMY;  Surgeon: Vinetta Greening, DO;  [facility];  Service: Endoscopy;;   POLYPECTOMY  12/23/2023   Procedure: POLYPECTOMY;  Surgeon: Vinetta Greening, DO;  [facility];  Service: Endoscopy;;   PORT-A-CATH REMOVAL  05/26/2012   Procedure: REMOVAL PORT-A-CATH;  Surgeon: Beau Bound, MD;  Location: [facility];  Service: General;  Laterality: N/A;  Minor Room   PORTACATH PLACEMENT      Current Outpatient Medications  Medication Sig Dispense Refill   atorvastatin  (LIPITOR) 40 MG tablet TAKE 1 TABLET BY MOUTH AT BEDTIME 90 tablet 0   blood glucose meter kit and supplies Dispense based on patient and insurance preference. Use up to four times daily as directed. (FOR ICD-10 E10.9, E11.9). 1 each 5   Blood Glucose  Monitoring Suppl DEVI 1 each by Does not apply route 2 (two) times daily. May substitute to any manufacturer covered by patient's insurance. 1 each 0   Blood Pressure KIT 1 Units by Does not apply route daily. 1 kit 0   cetirizine  (ZYRTEC ) 10 MG tablet Take 1 tablet (10 mg total) by mouth daily. 60 tablet 0   cetirizine -pseudoephedrine  (ZYRTEC -D) 5-120 MG tablet Take 1 tablet by mouth daily. 30 tablet 0   citalopram  (CELEXA ) 40 MG tablet TAKE 1 TABLET BY MOUTH EVERY DAY 90 tablet 2   dexlansoprazole  (DEXILANT ) 60 MG capsule Take 1 capsule (60 mg total) by mouth daily. 90 capsule 3   Dulaglutide  (TRULICITY ) 0.75 MG/0.5ML SOPN INJECT 0.75 MG SUBCUTANEOUSLY ONCE A WEEK 6 mL 5   DULoxetine  (CYMBALTA ) 30 MG capsule Take 1 capsule (30 mg total) by mouth daily. 90 capsule 1   empagliflozin  (JARDIANCE ) 10 MG TABS tablet Take 1 tablet (10 mg total) by mouth daily before breakfast. 90 tablet 0   ENTRESTO  24-26 MG TAKE 1 TABLET BY MOUTH TWICE A DAY 60 tablet 11   fluticasone  (FLONASE ) 50  MCG/ACT nasal spray Place 2 sprays into both nostrils daily. 16 g 0   HYDROcodone -acetaminophen  (NORCO) 5-325 MG tablet Take 1 tablet by mouth every 6 (six) hours as needed for moderate pain (pain score 4-6). Do Not Fill Before  02/ 14/2025 120 tablet 0   metFORMIN  (GLUCOPHAGE -XR) 750 MG 24 hr tablet Take 1 tablet by mouth once daily with breakfast 90 tablet 0   metoprolol  succinate (TOPROL -XL) 50 MG 24 hr tablet Take 1 tablet by mouth once daily 90 tablet 3   ondansetron  (ZOFRAN -ODT) 8 MG disintegrating tablet DISSOLVE 1 TABLET IN MOUTH EVERY 8 HOURS AS NEEDED FOR NAUSEA OR VOMITING 20 tablet 0   spironolactone  (ALDACTONE ) 25 MG tablet Take 0.5 tablets (12.5 mg total) by mouth daily. 45 tablet 3   Vitamin D , Ergocalciferol , (DRISDOL ) 1.25 MG (50000 UNIT) CAPS capsule TAKE 1 CAPSULE BY MOUTH ONE TIME PER WEEK 12 capsule 3   No current facility-administered medications for this visit.   Allergies:  Flagyl  [metronidazole ],  Lansoprazole, and Pravastatin    Social History: The patient  reports that she has never smoked. She has never used smokeless tobacco. She reports that she does not drink alcohol and does not use drugs.   Family History: The patient's family history includes Bipolar disorder in her sister; Cancer in her mother; Colon cancer in her mother; Coronary artery disease in her brother, father, and mother; Diabetes in her father, maternal grandfather, and paternal grandmother; Diabetes type I in her father and mother; Heart disease in her paternal grandfather; Hyperlipidemia in her maternal grandmother; Hypertension in her brother, father, maternal grandfather, and maternal grandmother; Peripheral vascular disease in her mother; Stroke in her maternal grandmother.   ROS:  Please see the history of present illness. Otherwise, complete review of systems is positive for none.  All other systems are reviewed and negative.   Physical Exam: VS:  BP 122/68 (BP Location: Left Arm, Cuff Size: Large)   Pulse 89   Ht 5\' 4"  (1.626 m)   Wt 236 lb (107 kg)   LMP 04/05/2016 Comment: spotting 11/2016 and 12/2016  SpO2 97%   BMI 40.51 kg/m , BMI Body mass index is 40.51 kg/m.  Wt Readings from Last 3 Encounters:  03/12/24 236 lb (107 kg)  02/18/24 238 lb (108 kg)  01/24/24 237 lb (107.5 kg)    General: Patient appears comfortable at rest. HEENT: Conjunctiva and lids normal, oropharynx clear with moist mucosa. Neck: Supple, no elevated JVP or carotid bruits, no thyromegaly. Lungs: Clear to auscultation, nonlabored breathing at rest. Cardiac: Regular rate and rhythm, no S3 or significant systolic murmur, no pericardial rub. Abdomen: Soft, nontender, no hepatomegaly, bowel sounds present, no guarding or rebound. Extremities: No pitting edema, distal pulses 2+. Skin: Warm and dry. Musculoskeletal: No kyphosis. Neuropsychiatric: Alert and oriented x3, affect grossly appropriate.  ECG:  NSR, LBBB; QRS 145 msec in  04/2022  Recent Labwork: 04/06/2023: B Natriuretic Peptide 22.0 07/18/2023: TSH 3.540 10/23/2023: Hemoglobin 15.0; Platelets 236 01/02/2024: ALT 13; AST 13 01/24/2024: BUN 7; Creatinine, Ser 0.81; Potassium 3.5; Sodium 140     Component Value Date/Time   CHOL 144 02/28/2023 0825   TRIG 193 (H) 02/28/2023 0825   HDL 49 02/28/2023 0825   CHOLHDL 2.9 02/28/2023 0825   CHOLHDL 4.4 10/20/2018 0749   VLDL 36 06/04/2014 0919   LDLCALC 63 02/28/2023 0825   LDLCALC 98 10/20/2018 0749     Assessment and Plan:  NICM LVEF (improved from 25% to 40 to 45%) s/p  CRT-D in June 2024: Compensated, not on Lasix .  Continue metoprolol  succinate 50 mg once daily, Entresto  24-26 mg twice daily, Jardiance  10 mg once daily and start spironolactone  12.5 mg once daily.  EKG showed atrial sensed ventricular paced rhythm.  Severe fatigue: Vitamin D , iron, ferritin within normal limits.  Pulm referral for OSA.    Medication Adjustments/Labs and Tests Ordered: Current medicines are reviewed at length with the patient today.  Concerns regarding medicines are outlined above.   Tests Ordered: Orders Placed This Encounter  Procedures   Ambulatory referral to Pulmonology   EKG 12-Lead    Medication Changes: Meds ordered this encounter  Medications   spironolactone  (ALDACTONE ) 25 MG tablet    Sig: Take 0.5 tablets (12.5 mg total) by mouth daily.    Dispense:  45 tablet    Refill:  3    Disposition:  Follow up 6 months Signed, Amarri Michaelson Beauford Bounds, MD, 03/12/2024 9:08 AM    [facility] [address], [place], [place] [address]

## 2024-03-12 NOTE — Patient Instructions (Addendum)
 Medication Instructions:  Your physician has recommended you make the following change in your medication:   -Stat Spironolactone  12.5 mg tablet once daily   *If you need a refill on your cardiac medications before your next appointment, please call your pharmacy*  Lab Work: None If you have labs (blood work) drawn today and your tests are completely normal, you will receive your results only by: MyChart Message (if you have MyChart) OR A paper copy in the mail If you have any lab test that is abnormal or we need to change your treatment, we will call you to review the results.  Testing/Procedures: None  Follow-Up: At [facility], you and your health needs are our priority.  As part of our continuing mission to provide you with exceptional heart care, our providers are all part of one team.  This team includes your primary Cardiologist (physician) and Advanced Practice Providers or APPs (Physician Assistants and Nurse Practitioners) who all work together to provide you with the care you need, when you need it.  Your next appointment:   6 month(s)  Provider:   You may see Vishnu P Mallipeddi, MD or one of the following Advanced Practice Providers on your designated Care Team:   [place], PA-C  Scotesia Bobo, [place] Theotis Flake, [place]     We recommend signing up for the patient portal called "MyChart".  Sign up information is provided on this After Visit Summary.  MyChart is used to connect with patients for Virtual Visits (Telemedicine).  Patients are able to view lab/test results, encounter notes, upcoming appointments, etc.  Non-urgent messages can be sent to your provider as well.   To learn more about what you can do with MyChart, go to [email or web address].   Other Instructions You have been referred to Pulmonology. They will contact you with your first appointment.

## 2024-03-16 ENCOUNTER — Other Ambulatory Visit: Payer: Self-pay | Admitting: Family Medicine

## 2024-03-16 DIAGNOSIS — F324 Major depressive disorder, single episode, in partial remission: Secondary | ICD-10-CM

## 2024-03-16 NOTE — Progress Notes (Deleted)
 58 y.o. G45P0013 Married Caucasian female here for annual exam.    PCP: Cook, Jayce G, DO   Patient's last menstrual period was 04/05/2016.           Sexually active: {yes no:314532}  The current method of family planning is post menopausal status.    Menopausal hormone therapy: none Exercising: {yes no:314532}  {types:19826} Smoker:  no  OB History  Gravida Para Term Preterm AB Living  4 3 0 0 1 3  SAB IAB Ectopic Multiple Live Births  0 0 0  3    # Outcome Date GA Lbr Len/2nd Weight Sex Type Anes PTL Lv  4 AB           3 Para         LIV  2 Para         LIV  1 Para         LIV     HEALTH MAINTENANCE: Last 2 paps: 2023-WNL, HPV-neg, 11/19/2023-WNL History of abnormal Pap or positive HPV: yes, 09-08-14 Ascus:Pos HR HPV with colposcopy revealing LGSIL on exocervical biopsy/2010 abnormal pap with colposcopy but no treatment to cervix.  Repeat pap was normal. 1990's had colposcopy with LEEP procedure to cervix  Mammogram: 06/13/2023-WNL, Cat B, scheduled for 06/18/2024 Colonoscopy: 09/22/2021, Due 09/2026 Bone Density: n/a  Result n/a   Immunization History  Administered Date(s) Administered   Influenza,inj,Quad PF,6+ Mos 08/02/2015, 11/14/2017, 10/14/2018, 08/25/2019, 08/23/2020, 10/23/2021, 08/28/2022   Influenza-Unspecified 08/13/2016, 12/19/2017   Moderna Sars-Covid-2 Vaccination 01/28/2020, 03/01/2020, 09/16/2020, 06/05/2021   PNEUMOCOCCAL CONJUGATE-20 11/07/2021   PPD Test 08/25/2019, 08/23/2020, 07/17/2022   Pneumococcal Polysaccharide-23 10/14/2018   Tdap 06/01/2015   Zoster Recombinant(Shingrix ) 10/29/2020      reports that she has never smoked. She has never used smokeless tobacco. She reports that she does not drink alcohol and does not use drugs.  Past Medical History:  Diagnosis Date   AICD (automatic cardioverter/defibrillator) present    Anxiety    Arthritis    Blood transfusion without reported diagnosis 1999   due to heavy menses   Breast cancer  (HCC) 05/22/2011   03/01/11, Stage 2, s/p lumpectomy, chemo/xrt   CHF (congestive heart failure) (HCC)    Complication of anesthesia    pt woke up during procedure in colonoscopy/endoscopy   DDD (degenerative disc disease), lumbar    Depression 06/22/2011   Diverticulitis    DM (diabetes mellitus) (HCC) 06/22/2011   Genital warts    GERD (gastroesophageal reflux disease) 06/22/2011   Hx of adenomatous colonic polyps 10/2007   4mm sigmoid tubular adenoma, FH colon cancer, mother in mid-50s   Hypercholesterolemia 06/22/2011   Hypertension 06/22/2011   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   Iron deficiency anemia 03/25/2015   LBBB (left bundle branch block)    Mild CAD    Morbid obesity (HCC)    NICM (nonischemic cardiomyopathy) (HCC)    Personal history of chemotherapy    Personal history of radiation therapy    Presence of permanent cardiac pacemaker    PSVT (paroxysmal supraventricular tachycardia) (HCC)    Shortness of breath    STD (sexually transmitted disease)    HPV, 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   Syncope    Syncope    Vaginal bleeding 03/08/2014    Past Surgical History:  Procedure Laterality Date   back surg x2     BALLOON DILATION N/A 09/22/2021   Procedure: BALLOON DILATION;  Surgeon: Vinetta Greening, DO;  Location: [facility];  Service: Endoscopy;  Laterality: N/A;   BIOPSY  09/22/2021   Procedure: BIOPSY;  Surgeon: Vinetta Greening, DO;  Location: [facility];  Service: Endoscopy;;   BIOPSY  12/23/2023   Procedure: BIOPSY;  Surgeon: Vinetta Greening, DO;  Location: [facility];  Service: Endoscopy;;   BIV ICD INSERTION CRT-D N/A 04/23/2023   Procedure: BIV ICD INSERTION CRT-D;  Surgeon: Efraim Grange, MD;  Location: [facility];  Service: Cardiovascular;  Laterality: N/A;   BREAST LUMPECTOMY Right 2012   CHOLECYSTECTOMY     COLONOSCOPY  11/03/07   4-mm sessile polyp removed/small internal hemorrhoids/tubular adenoma,  random colon bx negative for microscopic colitis   COLONOSCOPY WITH PROPOFOL  N/A 09/17/2016   Grade 2 hemorrhoids, colonic diverticulosis, ascending colon polyp (sessile serrated adenoma), 5 year surveillance   COLONOSCOPY WITH PROPOFOL  N/A 09/22/2021   Procedure: COLONOSCOPY WITH PROPOFOL ;  Surgeon: Vinetta Greening, DO;  Location: [facility];  Service: Endoscopy;  Laterality: N/A;  7:30am   COLONOSCOPY, ESOPHAGOGASTRODUODENOSCOPY (EGD) AND ESOPHAGEAL DILATION  01/2012   mild gastritis s/p biopsy. Empiric dilation. Normal duodenum.    DILATATION & CURETTAGE/HYSTEROSCOPY WITH MYOSURE N/A 02/10/2015   Procedure: DILATATION & CURETTAGE/HYSTEROSCOPY WITH MYOSURE;  Surgeon: Greta Leatherwood, MD;  Location: [facility];  Service: Gynecology;  Laterality: N/A;   DILATATION & CURETTAGE/HYSTEROSCOPY WITH MYOSURE N/A 01/24/2024   Procedure: DILATATION & CURETTAGE/HYSTEROSCOPY WITH MYOSURE;  Surgeon: Romaine Closs, MD;  Location: [facility];  Service: Gynecology;  Laterality: N/A;  [facility]   ESOPHAGOGASTRODUODENOSCOPY (EGD) WITH PROPOFOL  N/A 01/11/2020   Normal esophagus, s/p dilation, normal stomach, normal duodenum.    ESOPHAGOGASTRODUODENOSCOPY (EGD) WITH PROPOFOL  N/A 09/22/2021   Procedure: ESOPHAGOGASTRODUODENOSCOPY (EGD) WITH PROPOFOL ;  Surgeon: Vinetta Greening, DO;  [facility]: [facility];  Service: Endoscopy;  Laterality: N/A;   ESOPHAGOGASTRODUODENOSCOPY (EGD) WITH PROPOFOL  N/A 12/23/2023   Procedure: ESOPHAGOGASTRODUODENOSCOPY (EGD) WITH PROPOFOL ;  Surgeon: Vinetta Greening, DO;  [facility]: [facility];  Service: Endoscopy;  Laterality: N/A;  11:15 am, asa 3, pt knows to arrive at 6:15   MALONEY DILATION N/A 01/11/2020   Procedure: Londa Rival DILATION;  Surgeon: Suzette Espy, MD;  Location: [facility];  Service: Endoscopy;  Laterality: N/A;   MM BREAST STEREO BX*L*R/S     rt.   POLYPECTOMY  09/17/2016   Procedure: POLYPECTOMY;  Surgeon: Suzette Espy, MD;  Location: [facility];  Service:  Endoscopy;;  ascending colon   POLYPECTOMY  09/22/2021   Procedure: POLYPECTOMY;  Surgeon: Vinetta Greening, DO;  Location: [facility];  Service: Endoscopy;;   POLYPECTOMY  12/23/2023   Procedure: POLYPECTOMY;  Surgeon: Vinetta Greening, DO;  [facility]: [facility];  Service: Endoscopy;;   PORT-A-CATH REMOVAL  05/26/2012   Procedure: REMOVAL PORT-A-CATH;  Surgeon: Beau Bound, MD;  Location: [facility];  Service: General;  Laterality: N/A;  Minor Room   PORTACATH PLACEMENT      Current Outpatient Medications  Medication Sig Dispense Refill   atorvastatin  (LIPITOR) 40 MG tablet TAKE 1 TABLET BY MOUTH AT BEDTIME 90 tablet 0   blood glucose meter kit and supplies Dispense based on patient and insurance preference. Use up to four times daily as directed. (FOR ICD-10 E10.9, E11.9). 1 each 5   Blood Glucose Monitoring Suppl DEVI 1 each by Does not apply route 2 (two) times daily. May substitute to any manufacturer covered by patient's insurance. 1 each 0  Blood Pressure KIT 1 Units by Does not apply route daily. 1 kit 0   cetirizine  (ZYRTEC ) 10 MG tablet Take 1 tablet (10 mg total) by mouth daily. 60 tablet 0   cetirizine -pseudoephedrine  (ZYRTEC -D) 5-120 MG tablet Take 1 tablet by mouth daily. 30 tablet 0   citalopram  (CELEXA ) 40 MG tablet TAKE 1 TABLET BY MOUTH EVERY DAY 90 tablet 2   dexlansoprazole  (DEXILANT ) 60 MG capsule Take 1 capsule (60 mg total) by mouth daily. 90 capsule 3   Dulaglutide  (TRULICITY ) 0.75 MG/0.5ML SOPN INJECT 0.75 MG SUBCUTANEOUSLY ONCE A WEEK 6 mL 5   DULoxetine  (CYMBALTA ) 30 MG capsule Take 1 capsule (30 mg total) by mouth daily. 90 capsule 1   empagliflozin  (JARDIANCE ) 10 MG TABS tablet Take 1 tablet (10 mg total) by mouth daily before breakfast. 90 tablet 0   ENTRESTO  24-26 MG TAKE 1 TABLET BY MOUTH TWICE A DAY 60 tablet 11   fluticasone  (FLONASE ) 50 MCG/ACT nasal spray Place 2 sprays into both nostrils daily. 16 g 0   HYDROcodone -acetaminophen  (NORCO) 5-325 MG tablet  Take 1 tablet by mouth every 6 (six) hours as needed for moderate pain (pain score 4-6). Do Not Fill Before  02/ 14/2025 120 tablet 0   metFORMIN  (GLUCOPHAGE -XR) 750 MG 24 hr tablet Take 1 tablet by mouth once daily with breakfast 90 tablet 0   metoprolol  succinate (TOPROL -XL) 50 MG 24 hr tablet Take 1 tablet by mouth once daily 90 tablet 3   ondansetron  (ZOFRAN -ODT) 8 MG disintegrating tablet DISSOLVE 1 TABLET IN MOUTH EVERY 8 HOURS AS NEEDED FOR NAUSEA OR VOMITING 20 tablet 0   spironolactone  (ALDACTONE ) 25 MG tablet Take 0.5 tablets (12.5 mg total) by mouth daily. 45 tablet 3   Vitamin D , Ergocalciferol , (DRISDOL ) 1.25 MG (50000 UNIT) CAPS capsule TAKE 1 CAPSULE BY MOUTH ONE TIME PER WEEK 12 capsule 3   No current facility-administered medications for this visit.    ALLERGIES: Flagyl  [metronidazole ], Lansoprazole, and Pravastatin   Family History  Problem Relation Age of Onset   Colon cancer Mother        mid-50s, died of metastatic disease   Cancer Mother        liver   Coronary artery disease Mother    Diabetes type I Mother    Peripheral vascular disease Mother        Carotid disease in her 67s   Coronary artery disease Father        CAD in his 38s   Diabetes Father    Diabetes type I Father    Hypertension Father    Bipolar disorder Sister    Coronary artery disease Brother        MI in his 64s   Hypertension Brother    Hypertension Maternal Grandmother    Hyperlipidemia Maternal Grandmother    Stroke Maternal Grandmother    Hypertension Maternal Grandfather    Diabetes Maternal Grandfather    Diabetes Paternal Grandmother    Heart disease Paternal Grandfather     Review of Systems  PHYSICAL EXAM:  LMP 04/05/2016 Comment: spotting 11/2016 and 12/2016    General appearance: alert, cooperative and appears stated age Head: normocephalic, without obvious abnormality, atraumatic Neck: no adenopathy, supple, symmetrical, trachea midline and thyroid  normal to inspection and  palpation Lungs: clear to auscultation bilaterally Breasts: normal appearance, no masses or tenderness, No nipple retraction or dimpling, No nipple discharge or bleeding, No axillary adenopathy Heart: regular rate and rhythm Abdomen: soft, non-tender; no masses, no organomegaly  Extremities: extremities normal, atraumatic, no cyanosis or edema Skin: skin color, texture, turgor normal. No rashes or lesions Lymph nodes: cervical, supraclavicular, and axillary nodes normal. Neurologic: grossly normal  Pelvic: External genitalia:  no lesions              No abnormal inguinal nodes palpated.              Urethra:  normal appearing urethra with no masses, tenderness or lesions              Bartholins and Skenes: normal                 Vagina: normal appearing vagina with normal color and discharge, no lesions              Cervix: no lesions              Pap taken: {yes no:314532} Bimanual Exam:  Uterus:  normal size, contour, position, consistency, mobility, non-tender              Adnexa: no mass, fullness, tenderness              Rectal exam: {yes no:314532}.  Confirms.              Anus:  normal sphincter tone, no lesions  Chaperone was present for exam:  {BSCHAPERONE:31226::"Emily F, CMA"}  ASSESSMENT: Well woman visit with gynecologic exam.  PHQ-9: ***  ***  PLAN: Mammogram screening discussed. Self breast awareness reviewed. Pap and HRV collected:  {yes no:314532} Guidelines for Calcium , Vitamin D , regular exercise program including cardiovascular and weight bearing exercise. Medication refills:  *** {LABS (Optional):23779} Follow up:  ***    Additional counseling given.  {yes [id number]. ***  total time was spent for this patient encounter, including preparation, face-to-face counseling with the patient, coordination of care, and documentation of the encounter in addition to doing the well woman visit with gynecologic exam.

## 2024-03-17 ENCOUNTER — Other Ambulatory Visit: Payer: Self-pay | Admitting: Family Medicine

## 2024-03-17 ENCOUNTER — Ambulatory Visit: Payer: Self-pay | Admitting: Obstetrics and Gynecology

## 2024-03-18 ENCOUNTER — Other Ambulatory Visit: Payer: Self-pay | Admitting: Family Medicine

## 2024-03-18 MED ORDER — ONDANSETRON 8 MG PO TBDP: ORAL_TABLET | ORAL | 3 refills | Status: DC

## 2024-03-25 ENCOUNTER — Ambulatory Visit
Admission: EM | Admit: 2024-03-25 | Discharge: 2024-03-25 | Disposition: A | Discharge status: Home / Self Care | Attending: Family Medicine | Admitting: Family Medicine

## 2024-03-25 ENCOUNTER — Emergency Department (HOSPITAL_COMMUNITY)
Admission: EM | Admit: 2024-03-25 | Discharge: 2024-03-25 | Disposition: A | Discharge status: Home / Self Care | Attending: Emergency Medicine | Admitting: Emergency Medicine

## 2024-03-25 ENCOUNTER — Emergency Department (HOSPITAL_COMMUNITY)

## 2024-03-25 ENCOUNTER — Encounter (HOSPITAL_COMMUNITY): Payer: Self-pay | Admitting: *Deleted

## 2024-03-25 ENCOUNTER — Encounter: Payer: Self-pay | Admitting: Emergency Medicine

## 2024-03-25 ENCOUNTER — Other Ambulatory Visit: Payer: Self-pay

## 2024-03-25 DIAGNOSIS — R519 Headache, unspecified: Secondary | ICD-10-CM | POA: Insufficient documentation

## 2024-03-25 DIAGNOSIS — R0789 Other chest pain: Secondary | ICD-10-CM | POA: Diagnosis not present

## 2024-03-25 DIAGNOSIS — R0602 Shortness of breath: Secondary | ICD-10-CM | POA: Diagnosis not present

## 2024-03-25 DIAGNOSIS — R42 Dizziness and giddiness: Secondary | ICD-10-CM

## 2024-03-25 LAB — CBC WITH DIFFERENTIAL/PLATELET
Abs Immature Granulocytes: 0.02 10*3/uL (ref 0.00–0.07)
Basophils Absolute: 0.1 10*3/uL (ref 0.0–0.1)
Basophils Relative: 1 %
Eosinophils Absolute: 0.2 10*3/uL (ref 0.0–0.5)
Eosinophils Relative: 2 %
HCT: 44.8 % (ref 36.0–46.0)
Hemoglobin: 14 g/dL (ref 12.0–15.0)
Immature Granulocytes: 0 %
Lymphocytes Relative: 26 %
Lymphs Abs: 2.2 10*3/uL (ref 0.7–4.0)
MCH: 27.8 pg (ref 26.0–34.0)
MCHC: 31.3 g/dL (ref 30.0–36.0)
MCV: 89.1 fL (ref 80.0–100.0)
Monocytes Absolute: 0.6 10*3/uL (ref 0.1–1.0)
Monocytes Relative: 8 %
Neutro Abs: 5.3 10*3/uL (ref 1.7–7.7)
Neutrophils Relative %: 63 %
Platelets: 222 10*3/uL (ref 150–400)
RBC: 5.03 MIL/uL (ref 3.87–5.11)
RDW: 16 % — ABNORMAL HIGH (ref 11.5–15.5)
WBC: 8.3 10*3/uL (ref 4.0–10.5)
nRBC: 0 % (ref 0.0–0.2)

## 2024-03-25 LAB — TROPONIN I (HIGH SENSITIVITY): Troponin I (High Sensitivity): 3 ng/L (ref <18)

## 2024-03-25 LAB — COMPREHENSIVE METABOLIC PANEL WITH GFR
ALT: 22 U/L (ref 0–44)
AST: 23 U/L (ref 15–41)
Albumin: 4.1 g/dL (ref 3.5–5.0)
Alkaline Phosphatase: 65 U/L (ref 38–126)
Anion gap: 11 (ref 5–15)
BUN: 8 mg/dL (ref 6–20)
CO2: 26 mmol/L (ref 22–32)
Calcium: 9.5 mg/dL (ref 8.9–10.3)
Chloride: 99 mmol/L (ref 98–111)
Creatinine, Ser: 0.97 mg/dL (ref 0.44–1.00)
GFR, Estimated: >60 mL/min (ref >60)
Glucose, Bld: 93 mg/dL (ref 70–99)
Potassium: 3.9 mmol/L (ref 3.5–5.1)
Sodium: 136 mmol/L (ref 135–145)
Total Bilirubin: 0.6 mg/dL (ref 0.0–1.2)
Total Protein: 8 g/dL (ref 6.5–8.1)

## 2024-03-25 LAB — MAGNESIUM: Magnesium: 2 mg/dL (ref 1.7–2.4)

## 2024-03-25 MED ORDER — MECLIZINE HCL 12.5 MG PO TABS
25.0000 mg | ORAL_TABLET | Freq: Once | ORAL | Status: AC
  Administered 2024-03-25: 25 mg via ORAL
  Filled 2024-03-25: qty 2

## 2024-03-25 MED ORDER — SODIUM CHLORIDE 0.9 % IV BOLUS: 500.0000 mL | Freq: Once | INTRAVENOUS | Status: DC

## 2024-03-25 MED ORDER — PROCHLORPERAZINE EDISYLATE 10 MG/2ML IJ SOLN
10.0000 mg | Freq: Once | INTRAMUSCULAR | Status: AC
  Administered 2024-03-25: 10 mg via INTRAVENOUS
  Filled 2024-03-25: qty 2

## 2024-03-25 MED ORDER — DIPHENHYDRAMINE HCL 50 MG/ML IJ SOLN
25.0000 mg | Freq: Once | INTRAMUSCULAR | Status: AC
  Administered 2024-03-25: 25 mg via INTRAVENOUS
  Filled 2024-03-25: qty 1

## 2024-03-25 MED ORDER — MECLIZINE HCL 25 MG PO TABS: 25.0000 mg | ORAL_TABLET | Freq: Three times a day (TID) | ORAL | 0 refills | Status: DC | PRN

## 2024-03-25 MED ORDER — KETOROLAC TROMETHAMINE 15 MG/ML IJ SOLN
15.0000 mg | Freq: Once | INTRAMUSCULAR | Status: AC
  Administered 2024-03-25: 15 mg via INTRAVENOUS
  Filled 2024-03-25: qty 1

## 2024-03-25 NOTE — Discharge Instructions (Signed)
 Please present to the emergency department for further evaluation of your headache, dizziness, chest pressure, and shortness of breath.

## 2024-03-25 NOTE — Discharge Instructions (Signed)
 Please follow-up closely with your primary care doctor and cardiology on an outpatient basis.  Return to emergency department immediately for any new or worsening symptoms.

## 2024-03-25 NOTE — ED Triage Notes (Signed)
 Pt c/o dizziness and HA's for past 7 days. Dizziness comes and goes, c/o SOB when dizziness starts.

## 2024-03-25 NOTE — ED Triage Notes (Signed)
 Patient c/o dizziness x 10 days and with a headache.  Patient has taken Hydrocodone  w/o any relief.  Some blurred vision, nausea denies any vomiting.

## 2024-03-25 NOTE — ED Provider Notes (Signed)
 [facility]    CSN: [id number] Arrival date & time: 03/25/24  1353      History   Chief Complaint Chief Complaint  Patient presents with   Dizziness    HPI [name] is a 58 y.o. female.   Patient presents requesting evaluation for an approximate 7 to 10 day history of a persistent headache.  Patient states that it is intermittent and nothing makes it better or worse.  She also reports dizziness that is ongoing-it does keep her awake at sometimes.  She reports associated nausea and bilateral ear pressure.  There is no photophobia or phonophobia.  She also states that she has had some intermittent chest heaviness and shortness of breath.  There is a cough that randomly occurs.  No medications have been taken for the symptoms.  She does not endorse any fever, abdominal pain, vomiting, diarrhea, or dysuria.  The history is provided by the patient.  Dizziness Associated symptoms: chest pain, headaches, nausea and shortness of breath   Associated symptoms: no diarrhea, no palpitations, no vomiting and no weakness     Past Medical History:  Diagnosis Date   AICD (automatic cardioverter/defibrillator) present    Anxiety    Arthritis    Blood transfusion without reported diagnosis 1999   due to heavy menses   Breast cancer (HCC) 05/22/2011   03/01/11, Stage 2, s/p lumpectomy, chemo/xrt   CHF (congestive heart failure) (HCC)    Complication of anesthesia    pt woke up during procedure in colonoscopy/endoscopy   DDD (degenerative disc disease), lumbar    Depression 06/22/2011   Diverticulitis    DM (diabetes mellitus) (HCC) 06/22/2011   Genital warts    GERD (gastroesophageal reflux disease) 06/22/2011   Hx of adenomatous colonic polyps 10/2007   4mm sigmoid tubular adenoma, FH colon cancer, mother in mid-50s   Hypercholesterolemia 06/22/2011   Hypertension 06/22/2011   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   Iron deficiency anemia  03/25/2015   LBBB (left bundle branch block)    Mild CAD    Morbid obesity (HCC)    NICM (nonischemic cardiomyopathy) (HCC)    Personal history of chemotherapy    Personal history of radiation therapy    Presence of permanent cardiac pacemaker    PSVT (paroxysmal supraventricular tachycardia) (HCC)    Shortness of breath    STD (sexually transmitted disease)    HPV, 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   Syncope    Syncope    Vaginal bleeding 03/08/2014    Patient Active Problem List   Diagnosis Date Noted   Fatigue 03/12/2024   Postmenopausal bleeding 11/28/2023   Lumbar radiculopathy 09/07/2022   NICM (nonischemic cardiomyopathy) (HCC) 08/29/2022   CAD (coronary artery disease) 08/29/2022   Aortic atherosclerosis (HCC) 08/29/2022   Lipoma 05/16/2022   Syncope 05/02/2022   Chronic pain syndrome 05/01/2022   LBBB (left bundle branch block) 05/01/2022   Dyspepsia 08/09/2021   Constipation 03/21/2021   Diabetes mellitus without complication (HCC) 12/22/2020   Abdominal pain 12/22/2019   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   Fatty liver 09/06/2016   H/O adenomatous polyp of colon 01/24/2012   FH: colon cancer 01/24/2012   GERD (gastroesophageal reflux disease) 06/22/2011   Hyperlipidemia 06/22/2011   Essential hypertension 06/22/2011   Obesity 06/22/2011   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    Past Surgical History:  Procedure Laterality Date   back surg x2  BALLOON DILATION N/A 09/22/2021   Procedure: BALLOON DILATION;  Surgeon: Vinetta Greening, DO;  [facility]: [facility];  Service: Endoscopy;  Laterality: N/A;   BIOPSY  09/22/2021   Procedure: BIOPSY;  Surgeon: Vinetta Greening, DO;  [facility]: [facility];  Service: Endoscopy;;   BIOPSY  12/23/2023   Procedure: BIOPSY;  Surgeon: Vinetta Greening, DO;  [facility]: [facility];  Service: Endoscopy;;   BIV ICD INSERTION CRT-D N/A 04/23/2023    Procedure: BIV ICD INSERTION CRT-D;  Surgeon: Efraim Grange, MD;  Location: [facility];  Service: Cardiovascular;  Laterality: N/A;   BREAST LUMPECTOMY Right 2012   CHOLECYSTECTOMY     COLONOSCOPY  11/03/07   4-mm sessile polyp removed/small internal hemorrhoids/tubular adenoma, random colon bx negative for microscopic colitis   COLONOSCOPY WITH PROPOFOL  N/A 09/17/2016   Grade 2 hemorrhoids, colonic diverticulosis, ascending colon polyp (sessile serrated adenoma), 5 year surveillance   COLONOSCOPY WITH PROPOFOL  N/A 09/22/2021   Procedure: COLONOSCOPY WITH PROPOFOL ;  Surgeon: Vinetta Greening, DO;  [facility];  Service: Endoscopy;  Laterality: N/A;  7:30am   COLONOSCOPY, ESOPHAGOGASTRODUODENOSCOPY (EGD) AND ESOPHAGEAL DILATION  01/2012   mild gastritis s/p biopsy. Empiric dilation. Normal duodenum.    DILATATION & CURETTAGE/HYSTEROSCOPY WITH MYOSURE N/A 02/10/2015   Procedure: DILATATION & CURETTAGE/HYSTEROSCOPY WITH MYOSURE;  Surgeon: Greta Leatherwood, MD;  Location: [facility];  Service: Gynecology;  Laterality: N/A;   DILATATION & CURETTAGE/HYSTEROSCOPY WITH MYOSURE N/A 01/24/2024   Procedure: DILATATION & CURETTAGE/HYSTEROSCOPY WITH MYOSURE;  Surgeon: Romaine Closs, MD;  Location: [facility];  Service: Gynecology;  Laterality: N/A;  [facility]   ESOPHAGOGASTRODUODENOSCOPY (EGD) WITH PROPOFOL  N/A 01/11/2020   Normal esophagus, s/p dilation, normal stomach, normal duodenum.    ESOPHAGOGASTRODUODENOSCOPY (EGD) WITH PROPOFOL  N/A 09/22/2021   Procedure: ESOPHAGOGASTRODUODENOSCOPY (EGD) WITH PROPOFOL ;  Surgeon: Vinetta Greening, DO;  [facility];  Service: Endoscopy;  Laterality: N/A;   ESOPHAGOGASTRODUODENOSCOPY (EGD) WITH PROPOFOL  N/A 12/23/2023   Procedure: ESOPHAGOGASTRODUODENOSCOPY (EGD) WITH PROPOFOL ;  Surgeon: Vinetta Greening, DO;  [facility];  Service: Endoscopy;  Laterality: N/A;  11:15 am, asa 3, pt knows to arrive at 6:15   MALONEY DILATION N/A  01/11/2020   Procedure: Londa Rival DILATION;  Surgeon: Suzette Espy, MD;  [facility];  Service: Endoscopy;  Laterality: N/A;   MM BREAST STEREO BX*L*R/S     rt.   POLYPECTOMY  09/17/2016   Procedure: POLYPECTOMY;  Surgeon: Suzette Espy, MD;  [facility];  Service: Endoscopy;;  ascending colon   POLYPECTOMY  09/22/2021   Procedure: POLYPECTOMY;  Surgeon: Vinetta Greening, DO;  [facility];  Service: Endoscopy;;   POLYPECTOMY  12/23/2023   Procedure: POLYPECTOMY;  Surgeon: Vinetta Greening, DO;  [facility];  Service: Endoscopy;;   PORT-A-CATH REMOVAL  05/26/2012   Procedure: REMOVAL PORT-A-CATH;  Surgeon: Beau Bound, MD;  Location: [facility];  Service: General;  Laterality: N/A;  Minor Room   PORTACATH PLACEMENT      OB History     Gravida  4   Para  3   Term  0   Preterm  0   AB  1   Living  3      SAB  0   IAB  0   Ectopic  0   Multiple      Live Births  3  Home Medications    Prior to Admission medications   Medication Sig Start Date End Date Taking? Authorizing Provider  atorvastatin  (LIPITOR) 40 MG tablet TAKE 1 TABLET BY MOUTH AT BEDTIME 01/06/24  Yes Cook, Jayce G, DO  blood glucose meter kit and supplies Dispense based on patient and insurance preference. Use up to four times daily as directed. (FOR ICD-10 E10.9, E11.9). 01/05/20  Yes Areta Kuba, MD  Blood Glucose Monitoring Suppl DEVI 1 each by Does not apply route 2 (two) times daily. May substitute to any manufacturer covered by patient's insurance. 07/18/23  Yes Michelle Aid C, NP  Blood Pressure KIT 1 Units by Does not apply route daily. 02/28/23  Yes Mallipeddi, Vishnu P, MD  cetirizine  (ZYRTEC ) 10 MG tablet Take 1 tablet (10 mg total) by mouth daily. 02/07/24  Yes Cook, Jayce G, DO  cetirizine -pseudoephedrine  (ZYRTEC -D) 5-120 MG tablet Take 1 tablet by mouth daily. 02/06/24  Yes Leath-Warren, Belen Bowers, NP  citalopram  (CELEXA ) 40 MG  tablet Take 1 tablet by mouth once daily 03/16/24  Yes Cook, Jayce G, DO  dexlansoprazole  (DEXILANT ) 60 MG capsule Take 1 capsule (60 mg total) by mouth daily. 12/05/23  Yes Delman Ferns, NP  Dulaglutide  (TRULICITY ) 0.75 MG/0.5ML SOPN INJECT 0.75 MG SUBCUTANEOUSLY ONCE A WEEK 04/29/23  Yes Cook, Jayce G, DO  DULoxetine  (CYMBALTA ) 30 MG capsule Take 1 capsule (30 mg total) by mouth daily. 09/06/23  Yes Lovorn, Megan, MD  empagliflozin  (JARDIANCE ) 10 MG TABS tablet Take 1 tablet (10 mg total) by mouth daily before breakfast. 12/31/23  Yes Swinyer, Leilani Punter, NP  ENTRESTO  24-26 MG TAKE 1 TABLET BY MOUTH TWICE A DAY 06/10/23  Yes Hugh Madura, MD  fluticasone  (FLONASE ) 50 MCG/ACT nasal spray Place 2 sprays into both nostrils daily. 02/06/24  Yes Leath-Warren, Belen Bowers, NP  HYDROcodone -acetaminophen  (NORCO) 5-325 MG tablet Take 1 tablet by mouth every 6 (six) hours as needed for moderate pain (pain score 4-6). Do Not Fill Before  02/ 14/2025 03/05/24  Yes Raulkar, Keven Pel, MD  metFORMIN  (GLUCOPHAGE -XR) 750 MG 24 hr tablet Take 1 tablet by mouth once daily with breakfast 01/06/24  Yes Cook, Jayce G, DO  metoprolol  succinate (TOPROL -XL) 50 MG 24 hr tablet Take 1 tablet by mouth once daily 12/02/23  Yes Mallipeddi, Vishnu P, MD  ondansetron  (ZOFRAN -ODT) 8 MG disintegrating tablet DISSOLVE 1 TABLET IN MOUTH EVERY 8 HOURS AS NEEDED FOR NAUSEA OR VOMITING 03/18/24  Yes Cook, Jayce G, DO  spironolactone  (ALDACTONE ) 25 MG tablet Take 0.5 tablets (12.5 mg total) by mouth daily. 03/12/24 06/10/24 Yes Mallipeddi, Vishnu P, MD  Vitamin D , Ergocalciferol , (DRISDOL ) 1.25 MG (50000 UNIT) CAPS capsule TAKE 1 CAPSULE BY MOUTH ONE TIME PER WEEK 04/30/23  Yes Paulett Boros, MD    Family History Family History  Problem Relation Age of Onset   Colon cancer Mother        mid-50s, died of metastatic disease   Cancer Mother        liver   Coronary artery disease Mother    Diabetes type I Mother    Peripheral vascular  disease Mother        Carotid disease in her 110s   Coronary artery disease Father        CAD in his 53s   Diabetes Father    Diabetes type I Father    Hypertension Father    Bipolar disorder Sister    Coronary artery disease Brother  MI in his 30s   Hypertension Brother    Hypertension Maternal Grandmother    Hyperlipidemia Maternal Grandmother    Stroke Maternal Grandmother    Hypertension Maternal Grandfather    Diabetes Maternal Grandfather    Diabetes Paternal Grandmother    Heart disease Paternal Grandfather     Social History Social History   Tobacco Use   Smoking status: Never   Smokeless tobacco: Never  Vaping Use   Vaping status: Never Used  Substance Use Topics   Alcohol use: No    Alcohol/week: 0.0 standard drinks of alcohol   Drug use: No     Allergies   Flagyl  [metronidazole ], Lansoprazole, and Pravastatin    Review of Systems Review of Systems  Constitutional:  Negative for chills and fever.  HENT:  Positive for ear pain. Negative for congestion, rhinorrhea and sore throat.   Eyes:  Negative for photophobia, pain and redness.  Respiratory:  Positive for cough, chest tightness and shortness of breath.   Cardiovascular:  Positive for chest pain. Negative for palpitations.  Gastrointestinal:  Positive for nausea. Negative for abdominal pain, diarrhea and vomiting.  Genitourinary:  Negative for dysuria and urgency.  Musculoskeletal:  Negative for arthralgias and myalgias.  Skin:  Negative for rash.  Neurological:  Positive for dizziness and headaches. Negative for weakness.     Physical Exam Triage Vital Signs ED Triage Vitals  Encounter Vitals Group     BP 03/25/24 1440 106/71     Systolic BP Percentile --      Diastolic BP Percentile --      Pulse Rate 03/25/24 1440 88     Resp 03/25/24 1440 18     Temp 03/25/24 1440 98.3 F (36.8 C)     Temp Source 03/25/24 1440 Oral     SpO2 03/25/24 1440 95 %     Weight 03/25/24 1443 235 lb (106.6  kg)     Height 03/25/24 1443 5\' 4"  (1.626 m)     Head Circumference --      Peak Flow --      Pain Score 03/25/24 1442 7     Pain Loc --      Pain Education --      Exclude from Growth Chart --    No data found.  Updated Vital Signs BP 106/71 (BP Location: Left Arm)   Pulse 88   Temp 98.3 F (36.8 C) (Oral)   Resp 18   Ht 5\' 4"  (1.626 m)   Wt 235 lb (106.6 kg)   LMP 04/05/2016 Comment: spotting 11/2016 and 12/2016  SpO2 95%   BMI 40.34 kg/m   Physical Exam Vitals and nursing note reviewed.  Constitutional:      Appearance: Normal appearance.  HENT:     Head: Normocephalic.     Right Ear: Tympanic membrane and ear canal normal.     Left Ear: Tympanic membrane and ear canal normal.     Nose: Nose normal.     Mouth/Throat:     Mouth: Mucous membranes are moist.  Eyes:     Extraocular Movements: Extraocular movements intact.     Pupils: Pupils are equal, round, and reactive to light.  Cardiovascular:     Rate and Rhythm: Normal rate and regular rhythm.     Pulses: Normal pulses.     Heart sounds: Normal heart sounds.  Pulmonary:     Breath sounds: Normal breath sounds.  Abdominal:     General: Bowel sounds are normal.  Skin:  General: Skin is warm and dry.  Neurological:     General: No focal deficit present.     Mental Status: She is alert and oriented to person, place, and time.  Psychiatric:        Mood and Affect: Mood normal.        Behavior: Behavior normal.        Thought Content: Thought content normal.        Judgment: Judgment normal.    UC Treatments / Results  Labs (all labs ordered are listed, but only abnormal results are displayed) Labs Reviewed - No data to display  EKG   Radiology No results found.  Procedures Procedures (including critical care time)  Medications Ordered in UC Medications - No data to display  Initial Impression / Assessment and Plan / UC Course  I have reviewed the triage vital signs and the nursing  notes.  Pertinent labs & imaging results that were available during my care of the patient were reviewed by me and considered in my medical decision making (see chart for details).     Patient is here requesting evaluation for an ongoing headache, dizziness, chest pressure, and shortness of breath for the past 7 to 10 days.  Based upon her medical history and these concurrent symptoms, she will require additional evaluation which unfortunately extends beyond the capabilities urgent care.  Through shared decision making, she is agreeable to present at the Emergency Department for ongoing evaluation.  She is stable for transport via private vehicle.  Not actively having chest pain in our Urgent Care prior to leaving.  Final Clinical Impressions(s) / UC Diagnoses   Final diagnoses:  Acute nonintractable headache, unspecified headache type  Dizziness  Chest pressure     Discharge Instructions      Please present to the emergency department for further evaluation of your headache, dizziness, chest pressure, and shortness of breath.     ED Prescriptions   None    PDMP not reviewed this encounter.   Genene Kennel, FNP 03/25/24 [phone]

## 2024-03-25 NOTE — ED Provider Notes (Signed)
 [facility] AT [facility] Provider Note   CSN: [id number] Arrival date & time: 03/25/24  1512     History  Chief Complaint  Patient presents with   Dizziness    JERICA SWAGGERTY is a 58 y.o. female.  Patient is a 58 year old female who presents emergency department the chief complaint of headaches, dizziness which has been ongoing for approximate the past week.  She notes that the headaches have mainly been located in the frontal region.  She notes she feels as though there is a "squeezing sensation" in her head.  She does note that the dizzy spells have been daily and have been somewhat positional.  Patient states that she has had some associated shortness of breath with the dizzy spells.  She denies any increased lower extremity edema.  She has had no abdominal pain, nausea, vomiting, diarrhea.  Patient notes that she did hit her head a few weeks ago.  She denies any history of similar symptoms in the past.   Dizziness      Home Medications Prior to Admission medications   Medication Sig Start Date End Date Taking? Authorizing Provider  atorvastatin  (LIPITOR) 40 MG tablet TAKE 1 TABLET BY MOUTH AT BEDTIME 01/06/24   Cook, Jayce G, DO  blood glucose meter kit and supplies Dispense based on patient and insurance preference. Use up to four times daily as directed. (FOR ICD-10 E10.9, E11.9). 01/05/20   Areta Kuba, MD  Blood Glucose Monitoring Suppl DEVI 1 each by Does not apply route 2 (two) times daily. May substitute to any manufacturer covered by patient's insurance. 07/18/23   Derenda Flax, NP  Blood Pressure KIT 1 Units by Does not apply route daily. 02/28/23   Mallipeddi, Vishnu P, MD  cetirizine  (ZYRTEC ) 10 MG tablet Take 1 tablet (10 mg total) by mouth daily. 02/07/24   Cook, Jayce G, DO  cetirizine -pseudoephedrine  (ZYRTEC -D) 5-120 MG tablet Take 1 tablet by mouth daily. 02/06/24   Leath-Warren, Belen Bowers, NP  citalopram  (CELEXA ) 40 MG  tablet Take 1 tablet by mouth once daily 03/16/24   Cook, Jayce G, DO  dexlansoprazole  (DEXILANT ) 60 MG capsule Take 1 capsule (60 mg total) by mouth daily. 12/05/23   Delman Ferns, NP  Dulaglutide  (TRULICITY ) 0.75 MG/0.5ML SOPN INJECT 0.75 MG SUBCUTANEOUSLY ONCE A WEEK 04/29/23   Cook, Jayce G, DO  DULoxetine  (CYMBALTA ) 30 MG capsule Take 1 capsule (30 mg total) by mouth daily. 09/06/23   Lovorn, Megan, MD  empagliflozin  (JARDIANCE ) 10 MG TABS tablet Take 1 tablet (10 mg total) by mouth daily before breakfast. 12/31/23   Swinyer, Leilani Punter, NP  ENTRESTO  24-26 MG TAKE 1 TABLET BY MOUTH TWICE A DAY 06/10/23   Hugh Madura, MD  fluticasone  (FLONASE ) 50 MCG/ACT nasal spray Place 2 sprays into both nostrils daily. 02/06/24   Leath-Warren, Belen Bowers, NP  HYDROcodone -acetaminophen  (NORCO) 5-325 MG tablet Take 1 tablet by mouth every 6 (six) hours as needed for moderate pain (pain score 4-6). Do Not Fill Before  02/ 14/2025 03/05/24   Liam Redhead, MD  metFORMIN  (GLUCOPHAGE -XR) 750 MG 24 hr tablet Take 1 tablet by mouth once daily with breakfast 01/06/24   Cook, Jayce G, DO  metoprolol  succinate (TOPROL -XL) 50 MG 24 hr tablet Take 1 tablet by mouth once daily 12/02/23   Mallipeddi, Vishnu P, MD  ondansetron  (ZOFRAN -ODT) 8 MG disintegrating tablet DISSOLVE 1 TABLET IN MOUTH EVERY 8 HOURS AS NEEDED FOR NAUSEA OR VOMITING  03/18/24   Cook, Jayce G, DO  spironolactone  (ALDACTONE ) 25 MG tablet Take 0.5 tablets (12.5 mg total) by mouth daily. 03/12/24 06/10/24  Mallipeddi, Vishnu P, MD  Vitamin D , Ergocalciferol , (DRISDOL ) 1.25 MG (50000 UNIT) CAPS capsule TAKE 1 CAPSULE BY MOUTH ONE TIME PER WEEK 04/30/23   Paulett Boros, MD      Allergies    Flagyl  [metronidazole ], Lansoprazole, and Pravastatin     Review of Systems   Review of Systems  Neurological:  Positive for dizziness.  All other systems reviewed and are negative.   Physical Exam Updated Vital Signs BP 119/74 (BP Location: Left Arm)   Pulse  87   Temp 98.3 F (36.8 C) (Oral)   Resp 20   Ht 5\' 4"  (1.626 m)   Wt 106.6 kg   LMP 04/05/2016 Comment: spotting 11/2016 and 12/2016  SpO2 100%   BMI 40.34 kg/m  Physical Exam Vitals and nursing note reviewed.  Constitutional:      Appearance: Normal appearance.  HENT:     Head: Normocephalic and atraumatic.     Nose: Nose normal.     Mouth/Throat:     Mouth: Mucous membranes are moist.  Eyes:     Extraocular Movements: Extraocular movements intact.     Conjunctiva/sclera: Conjunctivae normal.     Pupils: Pupils are equal, round, and reactive to light.  Cardiovascular:     Rate and Rhythm: Normal rate and regular rhythm.     Pulses: Normal pulses.     Heart sounds: Normal heart sounds. No murmur heard.    No gallop.  Pulmonary:     Effort: Pulmonary effort is normal. No respiratory distress.     Breath sounds: Normal breath sounds. No stridor. No wheezing, rhonchi or rales.  Abdominal:     General: Abdomen is flat. Bowel sounds are normal. There is no distension.     Palpations: Abdomen is soft.     Tenderness: There is no abdominal tenderness. There is no guarding.  Musculoskeletal:        General: Normal range of motion.     Cervical back: Normal range of motion and neck supple. No rigidity or tenderness.     Right lower leg: No edema.     Left lower leg: No edema.  Skin:    General: Skin is warm and dry.     Findings: No rash.  Neurological:     General: No focal deficit present.     Mental Status: She is alert and oriented to person, place, and time. Mental status is at baseline.     Cranial Nerves: No cranial nerve deficit.     Sensory: No sensory deficit.     Motor: No weakness.     Coordination: Coordination normal.     Gait: Gait normal.  Psychiatric:        Mood and Affect: Mood normal.        Behavior: Behavior normal.        Thought Content: Thought content normal.        Judgment: Judgment normal.     ED Results / Procedures / Treatments    Labs (all labs ordered are listed, but only abnormal results are displayed) Labs Reviewed  COMPREHENSIVE METABOLIC PANEL WITH GFR  CBC WITH DIFFERENTIAL/PLATELET  URINALYSIS, ROUTINE W REFLEX MICROSCOPIC  MAGNESIUM   TROPONIN I (HIGH SENSITIVITY)    EKG None  Radiology No results found.  Procedures Procedures    Medications Ordered in ED Medications  meclizine  (ANTIVERT ) tablet  25 mg (has no administration in time range)  ketorolac  (TORADOL ) 15 MG/ML injection 15 mg (has no administration in time range)  prochlorperazine (COMPAZINE) injection 10 mg (has no administration in time range)  diphenhydrAMINE  (BENADRYL ) injection 25 mg (has no administration in time range)  sodium chloride  0.9 % bolus 500 mL (has no administration in time range)    ED Course/ Medical Decision Making/ A&P                                 Medical Decision Making Amount and/or Complexity of Data Reviewed Labs: ordered. Radiology: ordered.  Risk Prescription drug management.   This patient presents to the ED for concern of dizziness differential diagnosis includes CVA, TIA, positional vertigo, lab otitis, Mnire's disease, electrolyte derangement, acute kidney injury, dehydration, migraine, tension headache    Additional history obtained:  Additional history obtained from medical records External records from outside source obtained and reviewed including medical records   Lab Tests:  I Ordered, and personally interpreted labs.  The pertinent results include: No leukocytosis, no anemia, normal kidney function liver function, normal electrolytes, normal magnesium    Imaging Studies ordered:  I ordered imaging studies including CT scan of head, chest x-ray I independently visualized and interpreted imaging which showed no acute intracranial process, no acute cardiopulmonary process I agree with the radiologist interpretation   Medicines ordered and prescription drug management:  I  ordered medication including meclizine , Toradol , Benadryl , Compazine, IV fluids for headache, dizziness Reevaluation of the patient after these medicines showed that the patient improved I have reviewed the patients home medicines and have made adjustments as needed   Problem List / ED Course:  Patient symptoms have improved with treatment in the emergency department.  Discussed with patient symptoms are most consistent with vertigo at this time.  CT scan of the head demonstrated no acute pathology.  Do not suspect CVA or TIA as neurological exam is unremarkable and patient has negative head CT.  Blood work has been overall unremarkable as well.  Did discuss with patient of the need to follow-up with her cardiologist and discuss the spironolactone  that she is on as symptoms did began after she started taking this medication.  Will continue meclizine  on an outpatient basis as needed.  Strict turn precautions were provided for any new or worsening symptoms.  Patient voiced understanding and had no additional questions.  Patient was fully evaluated by attending physician who is in agreement to plan at this time.   Social Determinants of Health:  None           Final Clinical Impression(s) / ED Diagnoses Final diagnoses:  None    Rx / DC Orders ED Discharge Orders     None         Emmalene Hare 03/25/24 1734    Early Glisson, MD 03/26/24 [phone]

## 2024-03-25 NOTE — ED Notes (Signed)
 Patient is being discharged from the Urgent Care and sent to the Emergency Department via private vehicle . Per Tamar Fairly patient is in need of higher level of care due to further evaluation. Patient is aware and verbalizes understanding of plan of care.  Vitals:   03/25/24 1440  BP: 106/71  Pulse: 88  Resp: 18  Temp: 98.3 F (36.8 C)  SpO2: 95%

## 2024-03-30 DIAGNOSIS — Z419 Encounter for procedure for purposes other than remedying health state, unspecified: Secondary | ICD-10-CM | POA: Diagnosis not present

## 2024-03-31 ENCOUNTER — Ambulatory Visit: Payer: Medicaid Other | Admitting: Nurse Practitioner

## 2024-04-03 ENCOUNTER — Encounter
Payer: Medicaid Other | Attending: Physical Medicine and Rehabilitation | Admitting: Physical Medicine and Rehabilitation

## 2024-04-03 ENCOUNTER — Encounter: Payer: Self-pay | Admitting: Physical Medicine and Rehabilitation

## 2024-04-03 VITALS — BP 131/86 | HR 84 | Ht 64.0 in | Wt 236.8 lb

## 2024-04-03 DIAGNOSIS — G894 Chronic pain syndrome: Secondary | ICD-10-CM | POA: Insufficient documentation

## 2024-04-03 DIAGNOSIS — Z79891 Long term (current) use of opiate analgesic: Secondary | ICD-10-CM | POA: Insufficient documentation

## 2024-04-03 DIAGNOSIS — Z5181 Encounter for therapeutic drug level monitoring: Secondary | ICD-10-CM | POA: Diagnosis not present

## 2024-04-03 MED ORDER — DULOXETINE HCL 30 MG PO CPEP: 30.0000 mg | ORAL_CAPSULE | Freq: Every day | ORAL | 1 refills | Status: DC

## 2024-04-03 MED ORDER — BACLOFEN 10 MG PO TABS: 5.0000 mg | ORAL_TABLET | Freq: Three times a day (TID) | ORAL | 0 refills | Status: DC

## 2024-04-03 MED ORDER — HYDROCODONE-ACETAMINOPHEN 5-325 MG PO TABS: 1.0000 | ORAL_TABLET | Freq: Four times a day (QID) | ORAL | 0 refills | Status: DC | PRN

## 2024-04-03 NOTE — Patient Instructions (Signed)
 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  Also hx of lumbar surgery- been on Norco for "years".  Grief reaction and depression over death of sister.     Here for f/u on chronic back pain     Cannot do Pacemaker due to MRI- CT with contrast- so cannot do MRI- but based on  exam , pt has cervical myelopathy- is nontraumatic SCI.    2.   Get a cane! Use it!   3. Called Dr Rochelle Chu- [facility]- we need to get CT myelogram-    4.  Baclofen- 5 mg- 3x/day-  constipation and sedation- rare at the doses I'm giving you-  1st dose, 1st hour- be with someone- I don't foresee anything.   5.  No falls and no car accidents! No climbing ladders, etc.    6. Has neurogenic bowel and bladder.  Has urgency of bowel/bladder- but if it gets worse- go to ER.      7.  Wait for now on bladder meds -  and baclofen making you a little constipated,  might help bowel urgency.    8. Refilled Norco 5/325- is due  9. Refilled Duloxetine  30 mg daily- for nerve pain  10. F/U  wait list for 6 weeks-  and 3 months- but will order CT myelogram asap-  If you don't hear anything by next Wed/Thursday about CT myelogram, call me-

## 2024-04-03 NOTE — Progress Notes (Signed)
 Subjective:    Patient ID: [name], female    DOB: October 08, 1966, 57 y.o.   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  Also hx of lumbar surgery- been on Norco for "years".  Grief reaction and depression over death of sister.     Here for f/u on chronic back pain      Not doing well-  Having more pain- working 3 days/week- it takes a day to get back to  herself after seeing 2 clients-  [employer]-  4 hours first client and 2 hours 2nd client. M/W/F.     Legs are hurting so bad- and can hardly walk- after work for a day- only 2 clients- or stuff around the house . Hurting until she was in tears.    Now has to help her cook and do different tasks    Husband thinks the muscles are painful.   Can hardly get up steps anymore- has to hold up-  Is feeling weak-  Is feeling numb-  numbness to toes-  Esp L side.     Fingertips are so numb-  and entire Hand also numb, but worst at fingers.    Up to 236 lbs- has gained only 4 lbs  in last 6 months.   Doesn't feel like the same person anymore   A1c 6.1 per pt- although chart says 6.4 2/25. Thinks goes back soon for labs.   Legs feel so stiff- and sometimes arms spasms when moving.     Pooping comes on out-  pretty easily- has urgency and runs to bathroom to get there on time-  has has accidents- and has pooped on self- has had accidents  of bladder as well-  No problems getting pee out- severe urgency- gotta go, gotta go.    Pain Inventory Average Pain 6 Pain Right Now 8 My pain is sharp, stabbing, and aching  In the last 24 hours, has pain interfered with the following? General activity 4 Relation with others 4 Enjoyment of life 2 What TIME of day is your pain at its worst? morning , daytime, evening, and night Sleep (in general) Poor  Pain is worse with: walking,  bending, sitting, and standing Pain improves with: rest and medication Relief from Meds: 6  Family History  Problem Relation Age of Onset   Colon cancer Mother        mid-50s, died of metastatic disease   Cancer Mother        liver   Coronary artery disease Mother    Diabetes type I Mother    Peripheral vascular disease Mother        Carotid disease in her 82s   Coronary artery disease Father        CAD in his 60s   Diabetes Father    Diabetes type I Father    Hypertension Father    Bipolar disorder Sister    Coronary artery disease Brother        MI in his 25s   Hypertension Brother    Hypertension Maternal Grandmother    Hyperlipidemia Maternal Grandmother    Stroke Maternal Grandmother    Hypertension Maternal Grandfather    Diabetes Maternal Grandfather    Diabetes Paternal Grandmother    Heart disease Paternal Grandfather  Social History   Socioeconomic History   Marital status: Married    Spouse name: Not on file   Number of children: 3   Years of education: Not on file   Highest education level: Some college, no degree  Occupational History   Occupation: child care    Employer: [employer]  Tobacco Use   Smoking status: Never   Smokeless tobacco: Never  Vaping Use   Vaping status: Never Used  Substance and Sexual Activity   Alcohol use: No    Alcohol/week: 0.0 standard drinks of alcohol   Drug use: No   Sexual activity: Yes    Partners: Male    Birth control/protection: Post-menopausal  Other Topics Concern   Not on file  Social History Narrative   ** Merged History Encounter **       Social Drivers of Health   Financial Resource Strain: High Risk (12/31/2023)   Overall Financial Resource Strain (CARDIA)    Difficulty of Paying Living Expenses: Very hard  Food Insecurity: Food Insecurity Present (12/31/2023)   Hunger Vital Sign    Worried About Running Out of Food in the Last Year: Sometimes true    Ran Out of Food in the Last Year:  Sometimes true  Transportation Needs: No Transportation Needs (12/31/2023)   PRAPARE - Administrator, Civil Service (Medical): No    Lack of Transportation (Non-Medical): No  Physical Activity: Insufficiently Active (12/31/2023)   Exercise Vital Sign    Days of Exercise per Week: 1 day    Minutes of Exercise per Session: 10 min  Stress: Stress Concern Present (12/31/2023)   [employer] of Occupational Health - Occupational Stress Questionnaire    Feeling of Stress : To some extent  Social Connections: Moderately Isolated (12/31/2023)   Social Connection and Isolation Panel [NHANES]    Frequency of Communication with Friends and Family: More than three times a week    Frequency of Social Gatherings with Friends and Family: Once a week    Attends Religious Services: Never    Database administrator or Organizations: No    Attends Engineer, structural: Not on file    Marital Status: Married   Past Surgical History:  Procedure Laterality Date   back surg x2     BALLOON DILATION N/A 09/22/2021   Procedure: BALLOON DILATION;  Surgeon: Vinetta Greening, DO;  [facility];  Service: Endoscopy;  Laterality: N/A;   BIOPSY  09/22/2021   Procedure: BIOPSY;  Surgeon: Vinetta Greening, DO;  [facility];  Service: Endoscopy;;   BIOPSY  12/23/2023   Procedure: BIOPSY;  Surgeon: Vinetta Greening, DO;  [facility];  Service: Endoscopy;;   BIV ICD INSERTION CRT-D N/A 04/23/2023   Procedure: BIV ICD INSERTION CRT-D;  Surgeon: Efraim Grange, MD;  Location: [facility] INVASIVE CV LAB;  Service: Cardiovascular;  Laterality: N/A;   BREAST LUMPECTOMY Right 2012   CHOLECYSTECTOMY     COLONOSCOPY  11/03/07   4-mm sessile polyp removed/small internal hemorrhoids/tubular adenoma, random colon bx negative for microscopic colitis   COLONOSCOPY WITH PROPOFOL  N/A 09/17/2016   Grade 2 hemorrhoids, colonic diverticulosis, ascending colon polyp (sessile serrated  adenoma), 5 year surveillance   COLONOSCOPY WITH PROPOFOL  N/A 09/22/2021   Procedure: COLONOSCOPY WITH PROPOFOL ;  Surgeon: Vinetta Greening, DO;  [facility];  Service: Endoscopy;  Laterality: N/A;  7:30am   COLONOSCOPY, ESOPHAGOGASTRODUODENOSCOPY (EGD) AND ESOPHAGEAL DILATION  01/2012  mild gastritis s/p biopsy. Empiric dilation. Normal duodenum.    DILATATION & CURETTAGE/HYSTEROSCOPY WITH MYOSURE N/A 02/10/2015   Procedure: DILATATION & CURETTAGE/HYSTEROSCOPY WITH MYOSURE;  Surgeon: Greta Leatherwood, MD;  Location: [facility];  Service: Gynecology;  Laterality: N/A;   DILATATION & CURETTAGE/HYSTEROSCOPY WITH MYOSURE N/A 01/24/2024   Procedure: DILATATION & CURETTAGE/HYSTEROSCOPY WITH MYOSURE;  Surgeon: Romaine Closs, MD;  Location: [facility];  Service: Gynecology;  Laterality: N/A;  [facility]   ESOPHAGOGASTRODUODENOSCOPY (EGD) WITH PROPOFOL  N/A 01/11/2020   Normal esophagus, s/p dilation, normal stomach, normal duodenum.    ESOPHAGOGASTRODUODENOSCOPY (EGD) WITH PROPOFOL  N/A 09/22/2021   Procedure: ESOPHAGOGASTRODUODENOSCOPY (EGD) WITH PROPOFOL ;  Surgeon: Vinetta Greening, DO;  [facility];  Service: Endoscopy;  Laterality: N/A;   ESOPHAGOGASTRODUODENOSCOPY (EGD) WITH PROPOFOL  N/A 12/23/2023   Procedure: ESOPHAGOGASTRODUODENOSCOPY (EGD) WITH PROPOFOL ;  Surgeon: Vinetta Greening, DO;  [facility];  Service: Endoscopy;  Laterality: N/A;  11:15 am, asa 3, pt knows to arrive at 6:15   MALONEY DILATION N/A 01/11/2020   Procedure: Londa Rival DILATION;  Surgeon: Suzette Espy, MD;  [facility];  Service: Endoscopy;  Laterality: N/A;   MM BREAST STEREO BX*L*R/S     rt.   POLYPECTOMY  09/17/2016   Procedure: POLYPECTOMY;  Surgeon: Suzette Espy, MD;  [facility];  Service: Endoscopy;;  ascending colon   POLYPECTOMY  09/22/2021   Procedure: POLYPECTOMY;  Surgeon: Vinetta Greening, DO;  [facility];  Service: Endoscopy;;   POLYPECTOMY   12/23/2023   Procedure: POLYPECTOMY;  Surgeon: Vinetta Greening, DO;  [facility];  Service: Endoscopy;;   PORT-A-CATH REMOVAL  05/26/2012   Procedure: REMOVAL PORT-A-CATH;  Surgeon: Beau Bound, MD;  Location: [facility];  Service: General;  Laterality: N/A;  Minor Room   PORTACATH PLACEMENT     Past Surgical History:  Procedure Laterality Date   back surg x2     BALLOON DILATION N/A 09/22/2021   Procedure: BALLOON DILATION;  Surgeon: Vinetta Greening, DO;  [facility];  Service: Endoscopy;  Laterality: N/A;   BIOPSY  09/22/2021   Procedure: BIOPSY;  Surgeon: Vinetta Greening, DO;  [facility];  Service: Endoscopy;;   BIOPSY  12/23/2023   Procedure: BIOPSY;  Surgeon: Vinetta Greening, DO;  [facility];  Service: Endoscopy;;   BIV ICD INSERTION CRT-D N/A 04/23/2023   Procedure: BIV ICD INSERTION CRT-D;  Surgeon: Efraim Grange, MD;  Location: [facility] INVASIVE CV LAB;  Service: Cardiovascular;  Laterality: N/A;   BREAST LUMPECTOMY Right 2012   CHOLECYSTECTOMY     COLONOSCOPY  11/03/07   4-mm sessile polyp removed/small internal hemorrhoids/tubular adenoma, random colon bx negative for microscopic colitis   COLONOSCOPY WITH PROPOFOL  N/A 09/17/2016   Grade 2 hemorrhoids, colonic diverticulosis, ascending colon polyp (sessile serrated adenoma), 5 year surveillance   COLONOSCOPY WITH PROPOFOL  N/A 09/22/2021   Procedure: COLONOSCOPY WITH PROPOFOL ;  Surgeon: Vinetta Greening, DO;  [facility];  Service: Endoscopy;  Laterality: N/A;  7:30am   COLONOSCOPY, ESOPHAGOGASTRODUODENOSCOPY (EGD) AND ESOPHAGEAL DILATION  01/2012   mild gastritis s/p biopsy. Empiric dilation. Normal duodenum.    DILATATION & CURETTAGE/HYSTEROSCOPY WITH MYOSURE N/A 02/10/2015   Procedure: DILATATION & CURETTAGE/HYSTEROSCOPY WITH MYOSURE;  Surgeon: Greta Leatherwood, MD;  Location: [facility];  Service: Gynecology;  Laterality: N/A;   DILATATION & CURETTAGE/HYSTEROSCOPY  WITH MYOSURE N/A 01/24/2024   Procedure:  DILATATION & CURETTAGE/HYSTEROSCOPY WITH MYOSURE;  Surgeon: Romaine Closs, MD;  Location: [facility] OR;  Service: Gynecology;  Laterality: N/A;  [facility]   ESOPHAGOGASTRODUODENOSCOPY (EGD) WITH PROPOFOL  N/A 01/11/2020   Normal esophagus, s/p dilation, normal stomach, normal duodenum.    ESOPHAGOGASTRODUODENOSCOPY (EGD) WITH PROPOFOL  N/A 09/22/2021   Procedure: ESOPHAGOGASTRODUODENOSCOPY (EGD) WITH PROPOFOL ;  Surgeon: Vinetta Greening, DO;  [facility];  Service: Endoscopy;  Laterality: N/A;   ESOPHAGOGASTRODUODENOSCOPY (EGD) WITH PROPOFOL  N/A 12/23/2023   Procedure: ESOPHAGOGASTRODUODENOSCOPY (EGD) WITH PROPOFOL ;  Surgeon: Vinetta Greening, DO;  [facility];  Service: Endoscopy;  Laterality: N/A;  11:15 am, asa 3, pt knows to arrive at 6:15   MALONEY DILATION N/A 01/11/2020   Procedure: Londa Rival DILATION;  Surgeon: Suzette Espy, MD;  [facility];  Service: Endoscopy;  Laterality: N/A;   MM BREAST STEREO BX*L*R/S     rt.   POLYPECTOMY  09/17/2016   Procedure: POLYPECTOMY;  Surgeon: Suzette Espy, MD;  [facility];  Service: Endoscopy;;  ascending colon   POLYPECTOMY  09/22/2021   Procedure: POLYPECTOMY;  Surgeon: Vinetta Greening, DO;  [facility];  Service: Endoscopy;;   POLYPECTOMY  12/23/2023   Procedure: POLYPECTOMY;  Surgeon: Vinetta Greening, DO;  [facility];  Service: Endoscopy;;   PORT-A-CATH REMOVAL  05/26/2012   Procedure: REMOVAL PORT-A-CATH;  Surgeon: Beau Bound, MD;  Location: [facility];  Service: General;  Laterality: N/A;  Minor Room   PORTACATH PLACEMENT     Past Medical History:  Diagnosis Date   AICD (automatic cardioverter/defibrillator) present    Anxiety    Arthritis    Blood transfusion without reported diagnosis 1999   due to heavy menses   Breast cancer (HCC) 05/22/2011   03/01/11, Stage 2, s/p lumpectomy, chemo/xrt   CHF (congestive heart failure) (HCC)     Complication of anesthesia    pt woke up during procedure in colonoscopy/endoscopy   DDD (degenerative disc disease), lumbar    Depression 06/22/2011   Diverticulitis    DM (diabetes mellitus) (HCC) 06/22/2011   Genital warts    GERD (gastroesophageal reflux disease) 06/22/2011   Hx of adenomatous colonic polyps 10/2007   4mm sigmoid tubular adenoma, FH colon cancer, mother in mid-50s   Hypercholesterolemia 06/22/2011   Hypertension 06/22/2011   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   Iron deficiency anemia 03/25/2015   LBBB (left bundle branch block)    Mild CAD    Morbid obesity (HCC)    NICM (nonischemic cardiomyopathy) (HCC)    Personal history of chemotherapy    Personal history of radiation therapy    Presence of permanent cardiac pacemaker    PSVT (paroxysmal supraventricular tachycardia) (HCC)    Shortness of breath    STD (sexually transmitted disease)    HPV, 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   Syncope    Syncope    Vaginal bleeding 03/08/2014   BP 131/86 (BP Location: Left Arm, Patient Position: Sitting, Cuff Size: Large)   Pulse 84   Ht 5\' 4"  (1.626 m)   Wt 236 lb 12.8 oz (107.4 kg)   LMP 04/05/2016 Comment: spotting 11/2016 and 12/2016  SpO2 95%   BMI 40.65 kg/m   Opioid Risk Score:   Fall Risk Score:  `1  Depression screen PHQ 2/9     04/03/2024    3:00 PM 01/02/2024    3:06 PM 11/14/2023    2:18 PM 09/06/2023  1:01 PM 07/18/2023    9:01 AM 03/15/2023   12:47 PM 03/14/2023    3:16 PM  Depression screen PHQ 2/9  Decreased Interest 3 2 1 1 1 1 1   Down, Depressed, Hopeless 2 2 1 1 1 1 1   PHQ - 2 Score 5 4 2 2 2 2 2   Altered sleeping 2 3   1  2   Tired, decreased energy 2 3   2  2   Change in appetite 0 2   0  0  Feeling bad or failure about yourself  3 2   1   0  Trouble concentrating 1 1   1  2   Moving slowly or fidgety/restless 0 0   0  0  Suicidal thoughts 0 0   0  0  PHQ-9 Score 13 15   7  8   Difficult doing work/chores Very  difficult Somewhat difficult     Somewhat difficult      Review of Systems  Musculoskeletal:  Positive for arthralgias, back pain and neck pain.       Neck pain, lower back pain, bilateral shoulder pain radiating into arms, bilateral knee pain, foot/toe pain  All other systems reviewed and are negative.      Objective:   Physical Exam  Appears more depressed- sitting up in chair- not exam table, accompanied by husband, NAD  Neuro:  Decreased to light touch  from C2 down to T1 as well as T2- T12- Also decreased L1 to S2 B/L- becomes more numb down the legs.  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  Also hx of lumbar surgery- been on Norco for "years".  Grief reaction and depression over death of sister.     Here for f/u on chronic back pain     Cannot do Pacemaker due to MRI- CT with contrast- so cannot do MRI- but based on  exam , pt has cervical myelopathy- is nontraumatic SCI.    2.   Get a cane! Use it!   3. Called Dr Rochelle Chu- [facility]- we need to get CT myelogram-    4.  Baclofen- 5 mg- 3x/day-  constipation and sedation- rare at the doses I'm giving you-  1st dose, 1st hour- be with someone- I don't foresee anything.   5.  No falls and no car accidents! No climbing ladders, etc.    6. Has neurogenic  bowel and bladder.  Has urgency of bowel/bladder- but if it gets worse- go to ER.      7.  Wait for now on bladder meds -  and baclofen making you a little constipated,  might help bowel urgency.  8. Refilled Norco 5/325- is due  9. Refilled Duloxetine  30 mg daily- for nerve pain  10. F/U  wait list for 6 weeks-  and 3 months- but will order CT myelogram asap-  If you don't hear anything by next Wed/Thursday about CT myelogram, call me-     I spent a total of 45   minutes on total care today- >50% coordination of care- due to  as detailed above- very complex visit- dx'd pt with SCI/cervical myelopathy

## 2024-04-05 ENCOUNTER — Other Ambulatory Visit: Payer: Self-pay | Admitting: Family Medicine

## 2024-04-05 DIAGNOSIS — J302 Other seasonal allergic rhinitis: Secondary | ICD-10-CM

## 2024-04-06 ENCOUNTER — Other Ambulatory Visit: Payer: Self-pay | Admitting: Physical Medicine and Rehabilitation

## 2024-04-06 ENCOUNTER — Telehealth: Payer: Self-pay | Admitting: Cardiovascular Disease

## 2024-04-06 DIAGNOSIS — M4714 Other spondylosis with myelopathy, thoracic region: Secondary | ICD-10-CM

## 2024-04-06 DIAGNOSIS — G959 Disease of spinal cord, unspecified: Secondary | ICD-10-CM

## 2024-04-06 NOTE — Telephone Encounter (Signed)
 Patient wants to get a ID card showing she has a pacemaker.

## 2024-04-06 NOTE — Telephone Encounter (Signed)
 Will forward this call to our device team and covering RN, for further assistance with this request.

## 2024-04-07 ENCOUNTER — Ambulatory Visit: Admitting: Gastroenterology

## 2024-04-07 ENCOUNTER — Other Ambulatory Visit: Payer: Self-pay

## 2024-04-07 ENCOUNTER — Other Ambulatory Visit: Payer: Self-pay | Admitting: Hematology

## 2024-04-07 ENCOUNTER — Ambulatory Visit: Admitting: Nurse Practitioner

## 2024-04-07 ENCOUNTER — Other Ambulatory Visit: Payer: Self-pay | Admitting: Family Medicine

## 2024-04-07 DIAGNOSIS — C50911 Malignant neoplasm of unspecified site of right female breast: Secondary | ICD-10-CM

## 2024-04-07 MED ORDER — METFORMIN HCL ER 750 MG PO TB24: 750.0000 mg | ORAL_TABLET | Freq: Every day | ORAL | 3 refills | Status: AC

## 2024-04-07 MED ORDER — ATORVASTATIN CALCIUM 40 MG PO TABS: 40.0000 mg | ORAL_TABLET | Freq: Every day | ORAL | 3 refills | Status: AC

## 2024-04-08 ENCOUNTER — Telehealth: Payer: Self-pay | Admitting: Physical Medicine and Rehabilitation

## 2024-04-08 LAB — DRUG TOX MONITOR 1 W/CONF, ORAL FLD
Amphetamines: NEGATIVE ng/mL (ref <10)
Barbiturates: NEGATIVE ng/mL (ref <10)
Benzodiazepines: NEGATIVE ng/mL (ref <0.50)
Buprenorphine: NEGATIVE ng/mL (ref <0.10)
Cocaine: NEGATIVE ng/mL (ref <5.0)
Codeine: NEGATIVE ng/mL (ref <2.5)
Dihydrocodeine: 5.7 ng/mL — ABNORMAL HIGH (ref <2.5)
Fentanyl: NEGATIVE ng/mL (ref <0.10)
Heroin Metabolite: NEGATIVE ng/mL (ref <1.0)
Hydrocodone: 19.4 ng/mL — ABNORMAL HIGH (ref <2.5)
Hydromorphone: NEGATIVE ng/mL (ref <2.5)
MARIJUANA: NEGATIVE ng/mL (ref <2.5)
MDMA: NEGATIVE ng/mL (ref <10)
Meprobamate: NEGATIVE ng/mL (ref <2.5)
Methadone: NEGATIVE ng/mL (ref <5.0)
Morphine: NEGATIVE ng/mL (ref <2.5)
Nicotine Metabolite: NEGATIVE ng/mL (ref <5.0)
Norhydrocodone: NEGATIVE ng/mL (ref <2.5)
Noroxycodone: NEGATIVE ng/mL (ref <2.5)
Opiates: POSITIVE ng/mL — AB (ref <2.5)
Oxycodone: NEGATIVE ng/mL (ref <2.5)
Oxymorphone: NEGATIVE ng/mL (ref <2.5)
Phencyclidine: NEGATIVE ng/mL (ref <10)
Tapentadol: NEGATIVE ng/mL (ref <5.0)
Tramadol: NEGATIVE ng/mL (ref <5.0)
Zolpidem: NEGATIVE ng/mL (ref <5.0)

## 2024-04-08 LAB — DRUG TOX ALC METAB W/CON, ORAL FLD: Alcohol Metabolite: NEGATIVE ng/mL (ref <25)

## 2024-04-08 NOTE — Telephone Encounter (Signed)
 P called and stated she was told by lovorn to give a callback if she hasn't heard anything about her stat ct scan.

## 2024-04-09 ENCOUNTER — Other Ambulatory Visit: Payer: Self-pay | Admitting: Nurse Practitioner

## 2024-04-12 ENCOUNTER — Other Ambulatory Visit: Payer: Self-pay

## 2024-04-12 ENCOUNTER — Emergency Department (HOSPITAL_COMMUNITY)
Admission: EM | Admit: 2024-04-12 | Discharge: 2024-04-12 | Disposition: A | Discharge status: Home / Self Care | Attending: Emergency Medicine | Admitting: Emergency Medicine

## 2024-04-12 DIAGNOSIS — R220 Localized swelling, mass and lump, head: Secondary | ICD-10-CM

## 2024-04-12 DIAGNOSIS — K148 Other diseases of tongue: Secondary | ICD-10-CM | POA: Diagnosis not present

## 2024-04-12 DIAGNOSIS — I10 Essential (primary) hypertension: Secondary | ICD-10-CM | POA: Diagnosis not present

## 2024-04-12 MED ORDER — NAPROXEN 500 MG PO TABS: 500.0000 mg | ORAL_TABLET | Freq: Two times a day (BID) | ORAL | 0 refills | Status: DC

## 2024-04-12 NOTE — Discharge Instructions (Signed)
 Your exam is reassuring, there is no signs of swelling of the tongue or the throat, your vital signs been normal, I would recommend that you do take an anti-inflammatory such as ibuprofen  or Aleve , if you do not have any I have prescribed one for you called Naprosyn , take it twice a day.  Please take Naprosyn , 500mg  by mouth twice daily as needed for pain - this in an antiinflammatory medicine (NSAID) and is similar to ibuprofen  - many people feel that it is stronger than ibuprofen  and it is easier to take since it is a smaller pill.  Please use this only for 1 week - if your pain persists, you will need to follow up with your doctor in the office for ongoing guidance and pain control.  See your dentist on Tuesday for follow-up, ER for worsening symptoms

## 2024-04-12 NOTE — ED Notes (Signed)
 Patient given cup of Ice water  and crackers , drinking without choking, Patient stated "tongue feels the same, and some nausea." no vomiting noted.

## 2024-04-12 NOTE — ED Triage Notes (Signed)
 Pt states she recently got 3 of her wisdom teeth removed and one beside her wisdom tooth. States this was done last Thursday, per pt, tongues started swelling today and she was unable to swallow her medication. Denies hives, denies itching.

## 2024-04-12 NOTE — ED Provider Notes (Signed)
 [place] EMERGENCY DEPARTMENT AT [facility] Provider Note   CSN: [id number] Arrival date & time: 04/12/24  1012     History  Chief Complaint  Patient presents with   Oral Swelling    [name] is a 58 y.o. female.  HPI   This patient is a 58 year old female, she has some chronic pain issues for which she takes hydrocodone  for sciatica, she is on baclofen , she also takes metformin , Jardiance  and metoprolol .  She presents to the hospital today with a complaint of feeling like her tongue might be swollen and having trouble breathing.  She states this started this morning, she had several wisdom teeth removed on Thursday, 3 days ago.  This was 2 left lower, 1 left upper and 1 right lower.  There is no complications with the procedure, denies being placed on an antibiotic, denies having any rash or itching or swelling of her legs, no shortness of breath in the chest but feels like she has trouble breathing in her throat.  Family member at the bedside reports that her voice sounds normal  Home Medications Prior to Admission medications   Medication Sig Start Date End Date Taking? Authorizing Provider  naproxen  (NAPROSYN ) 500 MG tablet Take 1 tablet (500 mg total) by mouth 2 (two) times daily with a meal. 04/12/24  Yes Early Glisson, MD  atorvastatin  (LIPITOR) 40 MG tablet Take 1 tablet (40 mg total) by mouth at bedtime. 04/07/24   Cook, Jayce G, DO  baclofen  (LIORESAL ) 10 MG tablet Take 0.5 tablets (5 mg total) by mouth 3 (three) times daily. 1/2  3x/day- x 1 week,then can increase to 10 mg 1 tab 3x/day- 04/03/24   Lovorn, Megan, MD  blood glucose meter kit and supplies Dispense based on patient and insurance preference. Use up to four times daily as directed. (FOR ICD-10 E10.9, E11.9). 01/05/20   Areta Kuba, MD  Blood Glucose Monitoring Suppl DEVI 1 each by Does not apply route 2 (two) times daily. May substitute to any manufacturer covered by patient's insurance. 07/18/23    Derenda Flax, NP  Blood Pressure KIT 1 Units by Does not apply route daily. 02/28/23   Mallipeddi, Vishnu P, MD  cetirizine  (ZYRTEC ) 10 MG tablet Take 1 tablet by mouth once daily 04/06/24   Cook, Jayce G, DO  cetirizine -pseudoephedrine  (ZYRTEC -D) 5-120 MG tablet Take 1 tablet by mouth daily. 02/06/24   Leath-Warren, Belen Bowers, NP  citalopram  (CELEXA ) 40 MG tablet Take 1 tablet by mouth once daily 03/16/24   Cook, Jayce G, DO  dexlansoprazole  (DEXILANT ) 60 MG capsule Take 1 capsule (60 mg total) by mouth daily. 12/05/23   Delman Ferns, NP  Dulaglutide  (TRULICITY ) 0.75 MG/0.5ML SOPN INJECT 0.75 MG SUBCUTANEOUSLY ONCE A WEEK 04/29/23   Cook, Jayce G, DO  DULoxetine  (CYMBALTA ) 30 MG capsule Take 1 capsule (30 mg total) by mouth daily. 04/03/24   Lovorn, Megan, MD  empagliflozin  (JARDIANCE ) 10 MG TABS tablet TAKE 1 TABLET BY MOUTH ONCE DAILY BEFORE BREAKFAST 04/10/24   Mallipeddi, Kennyth Pean, MD  ENTRESTO  24-26 MG TAKE 1 TABLET BY MOUTH TWICE A DAY 06/10/23   Hugh Madura, MD  fluticasone  (FLONASE ) 50 MCG/ACT nasal spray Place 2 sprays into both nostrils daily. 02/06/24   Leath-Warren, Belen Bowers, NP  HYDROcodone -acetaminophen  (NORCO) 5-325 MG tablet Take 1 tablet by mouth every 6 (six) hours as needed for moderate pain (pain score 4-6). Do Not Fill Before  02/ 14/2025 04/03/24   Lovorn,  Jacqlyn Matas, MD  meclizine  (ANTIVERT ) 25 MG tablet Take 1 tablet (25 mg total) by mouth 3 (three) times daily as needed for dizziness. 03/25/24   Roselynn Connors, PA-C  metFORMIN  (GLUCOPHAGE -XR) 750 MG 24 hr tablet Take 1 tablet (750 mg total) by mouth daily with breakfast. 04/07/24   Cook, Jayce G, DO  metoprolol  succinate (TOPROL -XL) 50 MG 24 hr tablet Take 1 tablet by mouth once daily 12/02/23   Mallipeddi, Vishnu P, MD  ondansetron  (ZOFRAN -ODT) 8 MG disintegrating tablet DISSOLVE 1 TABLET IN MOUTH EVERY 8 HOURS AS NEEDED FOR NAUSEA OR VOMITING 03/18/24   Cook, Jayce G, DO  spironolactone  (ALDACTONE ) 25 MG tablet Take 0.5  tablets (12.5 mg total) by mouth daily. 03/12/24 06/10/24  Mallipeddi, Vishnu P, MD  Vitamin D , Ergocalciferol , (DRISDOL ) 1.25 MG (50000 UNIT) CAPS capsule Take 1 capsule by mouth once a week 04/08/24   Paulett Boros, MD      Allergies    Flagyl  [metronidazole ], Lansoprazole, and Pravastatin     Review of Systems   Review of Systems  All other systems reviewed and are negative.   Physical Exam Updated Vital Signs BP 119/62   Pulse 71   Temp 97.8 F (36.6 C) (Oral)   Resp 16   Ht 1.626 m (5\' 4" )   Wt 107.4 kg   LMP 04/05/2016 Comment: spotting 11/2016 and 12/2016  SpO2 93%   BMI 40.64 kg/m  Physical Exam Vitals and nursing note reviewed.  Constitutional:      General: She is not in acute distress.    Appearance: She is well-developed.  HENT:     Head: Normocephalic and atraumatic.     Mouth/Throat:     Pharynx: No oropharyngeal exudate.     Comments: The patient has no trismus or torticollis, inspecting in her mouth the extraction areas look normal without swelling of the gingiva or the tongue or the sublingual tissue, phonation is normal, there is no visible swelling of the posterior pharynx, no lymphadenopathy of the neck Eyes:     General: No scleral icterus.       Right eye: No discharge.        Left eye: No discharge.     Conjunctiva/sclera: Conjunctivae normal.     Pupils: Pupils are equal, round, and reactive to light.  Neck:     Thyroid : No thyromegaly.     Vascular: No JVD.  Cardiovascular:     Rate and Rhythm: Normal rate and regular rhythm.     Heart sounds: Normal heart sounds. No murmur heard.    No friction rub. No gallop.  Pulmonary:     Effort: Pulmonary effort is normal. No respiratory distress.     Breath sounds: Normal breath sounds. No wheezing or rales.  Abdominal:     General: Bowel sounds are normal. There is no distension.     Palpations: Abdomen is soft. There is no mass.     Tenderness: There is no abdominal tenderness.   Musculoskeletal:        General: No tenderness. Normal range of motion.     Cervical back: Normal range of motion and neck supple.     Right lower leg: No edema.     Left lower leg: No edema.  Lymphadenopathy:     Cervical: No cervical adenopathy.  Skin:    General: Skin is warm and dry.     Findings: No erythema or rash.  Neurological:     Mental Status: She is alert.  Coordination: Coordination normal.  Psychiatric:        Behavior: Behavior normal.     ED Results / Procedures / Treatments   Labs (all labs ordered are listed, but only abnormal results are displayed) Labs Reviewed - No data to display  EKG EKG Interpretation Date/Time:  Sunday Apr 12 2024 11:12:17 EDT Ventricular Rate:  73 PR Interval:  160 QRS Duration:  132 QT Interval:  465 QTC Calculation: 513 R Axis:   80  Text Interpretation: VENTRICULAR PACED RHYTHM Left bundle branch block Confirmed by Ahlijah Raia ([id number]) on 04/12/2024 11:21:01 AM  Radiology No results found.  Procedures Procedures    Medications Ordered in ED Medications - No data to display  ED Course/ Medical Decision Making/ A&P                                 Medical Decision Making Risk Prescription drug management.   The patient does not appear swollen in the face or in the mouth or in the pharynx, vital signs are normal without tachycardia hypoxia or hypotension, I do not see any signs of rashes or itching and she is not on anything that would cause angioedema.  She was not placed on an antibiotic and the hydrocodone is a chronic medication for her.  I will watch her on a cardiac monitor for a while to make sure there is no changes however I do not see any signs of acute allergic reaction at this time  This patient was observed for almost 3 hours without any significant changes, tolerated oral food and fluids, I do not see any signs of allergic reaction, she may have some sensation from a nerve after the anesthesia on the  back of her mouth but there does not appear to be any significant or high risk features.  She is stable for discharge on an anti-inflammatory        Final Clinical Impression(s) / ED Diagnoses Final diagnoses:  Mild tongue swelling    Rx / DC Orders ED Discharge Orders          Ordered    naproxen (NAPROSYN) 500 MG tablet  2 times daily with meals        05 /25/25 1301              Early Glisson, MD 04/12/24 1302

## 2024-04-14 ENCOUNTER — Ambulatory Visit (INDEPENDENT_AMBULATORY_CARE_PROVIDER_SITE_OTHER): Admitting: Nurse Practitioner

## 2024-04-14 VITALS — BP 132/86 | HR 81 | Temp 97.7°F | Ht 64.0 in | Wt 235.8 lb

## 2024-04-14 DIAGNOSIS — B370 Candidal stomatitis: Secondary | ICD-10-CM | POA: Diagnosis not present

## 2024-04-14 DIAGNOSIS — Z7984 Long term (current) use of oral hypoglycemic drugs: Secondary | ICD-10-CM | POA: Diagnosis not present

## 2024-04-14 DIAGNOSIS — E1159 Type 2 diabetes mellitus with other circulatory complications: Secondary | ICD-10-CM

## 2024-04-14 DIAGNOSIS — E119 Type 2 diabetes mellitus without complications: Secondary | ICD-10-CM

## 2024-04-14 DIAGNOSIS — R110 Nausea: Secondary | ICD-10-CM

## 2024-04-14 MED ORDER — NYSTATIN 100000 UNIT/ML MT SUSP: 5.0000 mL | Freq: Four times a day (QID) | OROMUCOSAL | 0 refills | Status: DC

## 2024-04-15 ENCOUNTER — Encounter: Payer: Self-pay | Admitting: Nurse Practitioner

## 2024-04-15 NOTE — Progress Notes (Signed)
 Subjective:    Patient ID: [name], female    DOB: Nov 17, 1966, 58 y.o.   MRN: [id number]  HPI Routine follow-up of her diabetes.  Note her husband is present per her request, defers being interviewed alone.  Currently on Trulicity , Jardiance  and metformin .  Jardiance  is prescribe by her cardiologist, gets regular follow-up.  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  Working about 3 days a week.  Staying very active through her job.  Having significant pain in her back and legs.  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  Being followed by pain management for this.  Has decreased her soda intake, does drink some sweet tea.  Limited bread and fried foods.  Had an eye exam scheduled for June but has to reschedule this since she is going to be testing for possible sleep apnea.  Gets regular preventive health physicals with gynecology. Complaints of a thickened feeling in the middle of her tongue.  No significant pain or tenderness.  No other oral lesions.  No difficulty swallowing or sore throat.  No change in taste.  Was seen at local ED on 04/12/2024 but no abnormalities were noted. Her main complaint today is persistent recurrent nausea.  No vomiting.  Taking Zofran  on a fairly regular basis to control the nausea.  Currently on Dexilant  through GI specialist.   Review of Systems  Constitutional:  Positive for fatigue.  HENT:  Negative for sore throat and trouble swallowing.   Respiratory:  Negative for cough, chest tightness and shortness of breath.   Cardiovascular:  Positive for leg swelling. Negative for chest pain.       Mild edema in the lower legs especially after work, denies any orthopnea.  Gastrointestinal:  Positive for nausea. Negative for abdominal pain, constipation, diarrhea and vomiting.      04/03/2024    3:00 PM  Depression screen PHQ 2/9  Decreased Interest 3  Down, Depressed, Hopeless 2  PHQ - 2  Score 5  Altered sleeping 2  Tired, decreased energy 2  Change in appetite 0  Feeling bad or failure about yourself  3  Trouble concentrating 1  Moving slowly or fidgety/restless 0  Suicidal thoughts 0  PHQ-9 Score 13  Difficult doing work/chores Very difficult      01/02/2024    3:07 PM 07/18/2023    9:01 AM 03/14/2023    3:16 PM 04/14/2021    1:12 PM  GAD 7 : Generalized Anxiety Score  Nervous, Anxious, on Edge 2 2 2 2   Control/stop worrying 2 2 3 3   Worry too much - different things 2 2 0 3  Trouble relaxing 2 2 2 2   Restless 2 1 1 2   Easily annoyed or irritable 2 1 1 2   Afraid - awful might happen 1 1 1 1   Total GAD 7 Score 13 11 10 15   Anxiety Difficulty Somewhat difficult  Somewhat difficult Extremely difficult    Social History   Tobacco Use   Smoking status: Never   Smokeless tobacco: Never  Vaping Use   Vaping status: Never Used  Substance Use Topics   Alcohol use: No    Alcohol/week: 0.0 standard drinks of alcohol   Drug use: No        Objective:   Physical Exam NAD.  Alert, oriented.  Mild pallor noted.  Thyroid  nontender to palpation, no mass or goiter noted.  Tongue: White thick and area  noted all along the tongue with no erythema or lesions.  Oral mucosa clear.  Pharynx clear and moist.  Lungs clear.  Heart regular rate rhythm.  Abdomen soft nondistended nontender.  No obvious masses. Today's Vitals   04/14/24 1400  BP: 132/86  Pulse: 81  Temp: 97.7 F (36.5 C)  SpO2: 98%  Weight: 235 lb 12.8 oz (107 kg)  Height: 5\' 4"  (1.626 m)   Body mass index is 40.47 kg/m. Weight stable.  Diabetic Foot Exam - Simple   Simple Foot Form Visual Inspection No deformities, no ulcerations, no other skin breakdown bilaterally: Yes Sensation Testing Intact to touch and monofilament testing bilaterally: Yes Pulse Check See comments: Yes Comments DP pulses present bilaterally; toes cool with normal cap refill.     CBC dated 03/25/2024 shows no anemia or  infection.    Assessment & Plan:   Problem List Items Addressed This Visit       Endocrine   Diabetes mellitus without complication (HCC) - Primary   Other Visit Diagnoses       Oral candidiasis       Relevant Medications   nystatin  (MYCOSTATIN ) 100000 UNIT/ML suspension     Nausea          Meds ordered this encounter  Medications   nystatin  (MYCOSTATIN ) 100000 UNIT/ML suspension    Sig: Take 5 mLs (500,000 Units total) by mouth 4 (four) times daily.    Dispense:  180 mL    Refill:  0    Supervising Provider:   Charlotta Cook A [[id number]]   Start nystatin  oral suspension as directed for oral candidiasis.  Call back if persists. Discussed potential causes for her persistent nausea including diabetic gastroenteritis, side effects of Trulicity  and history of GERD.  Discussed lifestyle factors affecting reflux symptoms.  Recommend follow-up with gastroenterology if symptoms persist.  In the meantime continue Zofran  and Dexilant  as directed.  Patient defers stopping Trulicity . Continue follow-up with pain management and cardiology as planned. Return in about 3 months (around 07/15/2024). Repeat lab work including lipid and A1c at that time.

## 2024-04-17 ENCOUNTER — Ambulatory Visit (HOSPITAL_COMMUNITY)

## 2024-04-21 ENCOUNTER — Ambulatory Visit (HOSPITAL_COMMUNITY)
Admission: RE | Admit: 2024-04-21 | Discharge: 2024-04-21 | Disposition: A | Discharge status: Home / Self Care | Source: Ambulatory Visit | Attending: Physical Medicine and Rehabilitation | Admitting: Physical Medicine and Rehabilitation

## 2024-04-21 DIAGNOSIS — M4714 Other spondylosis with myelopathy, thoracic region: Secondary | ICD-10-CM | POA: Diagnosis not present

## 2024-04-21 DIAGNOSIS — G959 Disease of spinal cord, unspecified: Secondary | ICD-10-CM | POA: Diagnosis not present

## 2024-04-21 DIAGNOSIS — M4712 Other spondylosis with myelopathy, cervical region: Secondary | ICD-10-CM | POA: Diagnosis not present

## 2024-04-21 DIAGNOSIS — M4802 Spinal stenosis, cervical region: Secondary | ICD-10-CM | POA: Diagnosis not present

## 2024-04-21 DIAGNOSIS — M4716 Other spondylosis with myelopathy, lumbar region: Secondary | ICD-10-CM | POA: Diagnosis not present

## 2024-04-21 DIAGNOSIS — M4319 Spondylolisthesis, multiple sites in spine: Secondary | ICD-10-CM | POA: Diagnosis not present

## 2024-04-21 MED ORDER — SODIUM CHLORIDE (PF) 0.9 % IJ SOLN
INTRAMUSCULAR | Status: AC
  Filled 2024-04-21: qty 50

## 2024-04-21 MED ORDER — IOHEXOL 300 MG/ML  SOLN
100.0000 mL | Freq: Once | INTRAMUSCULAR | Status: AC | PRN
  Administered 2024-04-21: 100 mL via INTRAVENOUS

## 2024-04-22 ENCOUNTER — Ambulatory Visit (INDEPENDENT_AMBULATORY_CARE_PROVIDER_SITE_OTHER)

## 2024-04-22 DIAGNOSIS — I428 Other cardiomyopathies: Secondary | ICD-10-CM

## 2024-04-23 LAB — CUP PACEART REMOTE DEVICE CHECK
Battery Remaining Longevity: 122 mo
Battery Voltage: 3.01 V
Brady Statistic RV Percent Paced: 7.64 %
Date Time Interrogation Session: 20250603230800
HighPow Impedance: 73 Ohm
Implantable Lead Connection Status: 753985
Implantable Lead Connection Status: 753985
Implantable Lead Connection Status: 753985
Implantable Lead Implant Date: 20240604
Implantable Lead Implant Date: 20240604
Implantable Lead Implant Date: 20240604
Implantable Lead Location: 753858
Implantable Lead Location: 753859
Implantable Lead Location: 753860
Implantable Lead Model: 4598
Implantable Lead Model: 5076
Implantable Pulse Generator Implant Date: 20240604
Lead Channel Impedance Value: 1026 Ohm
Lead Channel Impedance Value: 1197 Ohm
Lead Channel Impedance Value: 1197 Ohm
Lead Channel Impedance Value: 380 Ohm
Lead Channel Impedance Value: 418 Ohm
Lead Channel Impedance Value: 551 Ohm
Lead Channel Impedance Value: 570 Ohm
Lead Channel Impedance Value: 646 Ohm
Lead Channel Impedance Value: 703 Ohm
Lead Channel Impedance Value: 741 Ohm
Lead Channel Impedance Value: 741 Ohm
Lead Channel Impedance Value: 855 Ohm
Lead Channel Impedance Value: 874 Ohm
Lead Channel Pacing Threshold Amplitude: 0.625 V
Lead Channel Pacing Threshold Amplitude: 0.75 V
Lead Channel Pacing Threshold Amplitude: 1 V
Lead Channel Pacing Threshold Pulse Width: 0.4 ms
Lead Channel Pacing Threshold Pulse Width: 0.4 ms
Lead Channel Pacing Threshold Pulse Width: 0.4 ms
Lead Channel Sensing Intrinsic Amplitude: 1.8 mV
Lead Channel Sensing Intrinsic Amplitude: 9.1 mV
Lead Channel Setting Pacing Amplitude: 1.25 V
Lead Channel Setting Pacing Amplitude: 1.5 V
Lead Channel Setting Pacing Amplitude: 2 V
Lead Channel Setting Pacing Pulse Width: 0.4 ms
Lead Channel Setting Pacing Pulse Width: 0.4 ms
Lead Channel Setting Sensing Sensitivity: 0.3 mV
Zone Setting Status: 755011
Zone Setting Status: 755011
Zone Setting Status: 755011

## 2024-04-24 ENCOUNTER — Ambulatory Visit: Payer: Self-pay | Admitting: Cardiovascular Disease

## 2024-04-30 DIAGNOSIS — Z419 Encounter for procedure for purposes other than remedying health state, unspecified: Secondary | ICD-10-CM | POA: Diagnosis not present

## 2024-05-04 ENCOUNTER — Telehealth: Payer: Self-pay

## 2024-05-04 MED ORDER — HYDROCODONE-ACETAMINOPHEN 5-325 MG PO TABS: 1.0000 | ORAL_TABLET | Freq: Four times a day (QID) | ORAL | 0 refills | Status: DC | PRN

## 2024-05-04 NOTE — Addendum Note (Signed)
 Addended by: Jodi Munroe on: 05/04/2024 04:20 PM   Modules accepted: Orders

## 2024-05-04 NOTE — Telephone Encounter (Signed)
 Patient calling into the office requesting a refill on Hydrocodone .  Dr. Raynaldo Call is out of the office and message will be sent to other provider for review.  Patient aware Dr. Raynaldo Call is not available (out of the office)

## 2024-05-04 NOTE — Telephone Encounter (Signed)
 PMP was reviewed.  Dr Raynaldo Call note was reviewed.  She was seen in May 2025 and was supposed to be on wait list, will send to office staff.  If Dr Raynaldo Call doesn;t have any opening, please scheduled with Emilia Harbour in July, nothing past 05/28/2024.  Hydrocodone  e-scribed to pharmacy, call placed to ms Merlyn Starring regarding the above.

## 2024-05-09 ENCOUNTER — Other Ambulatory Visit: Payer: Self-pay | Admitting: Family Medicine

## 2024-05-09 DIAGNOSIS — E1165 Type 2 diabetes mellitus with hyperglycemia: Secondary | ICD-10-CM

## 2024-05-12 ENCOUNTER — Encounter: Admitting: Cardiovascular Disease

## 2024-05-12 ENCOUNTER — Ambulatory Visit: Admitting: Adult Health

## 2024-05-13 ENCOUNTER — Other Ambulatory Visit: Payer: Self-pay

## 2024-05-13 DIAGNOSIS — J302 Other seasonal allergic rhinitis: Secondary | ICD-10-CM

## 2024-05-13 MED ORDER — CETIRIZINE HCL 10 MG PO TABS: 10.0000 mg | ORAL_TABLET | Freq: Every day | ORAL | 2 refills | Status: DC

## 2024-05-21 ENCOUNTER — Other Ambulatory Visit: Payer: Self-pay | Admitting: Family Medicine

## 2024-05-25 ENCOUNTER — Telehealth: Payer: Self-pay | Admitting: Internal Medicine

## 2024-05-25 MED ORDER — SACUBITRIL-VALSARTAN 24-26 MG PO TABS: 1.0000 | ORAL_TABLET | Freq: Two times a day (BID) | ORAL | 2 refills | Status: AC

## 2024-05-25 NOTE — Telephone Encounter (Signed)
*  STAT* If patient is at the pharmacy, call can be transferred to refill team.   1. Which medications need to be refilled? (please list name of each medication and dose if known) ENTRESTO  24-26 MG   2. Which pharmacy/location (including street and city if local pharmacy) is medication to be sent to?  [id number] - [place], [place] - [address]    3. Do they need a 30 day or 90 day supply? 90

## 2024-05-25 NOTE — Telephone Encounter (Signed)
 Pt's medication was sent to pt's pharmacy as requested. Confirmation received.

## 2024-05-26 ENCOUNTER — Encounter: Admitting: Registered Nurse

## 2024-05-28 ENCOUNTER — Encounter: Admitting: Registered Nurse

## 2024-05-30 DIAGNOSIS — Z419 Encounter for procedure for purposes other than remedying health state, unspecified: Secondary | ICD-10-CM | POA: Diagnosis not present

## 2024-06-02 ENCOUNTER — Telehealth: Payer: Self-pay | Admitting: *Deleted

## 2024-06-02 NOTE — Progress Notes (Signed)
 Subjective:    Patient ID: [name], female    DOB: [birth year], 58 y.o.   MRN: [id number]  HPI: [name] is a 58 y.o. female who returns for follow up appointment for chronic pain and medication refill. She states her pain is located in her neck, mid- lower back radiating into her left lower extremity.She also reports receiving only one hour of pain relief with her current medication regimen, this will be discussed with Dr Cornelio, she verbalizes understanding. She  rates her pain 9. Her current exercise regime is walking short distances  and performing stretching exercises.  [name] Morphine  equivalent is 20.00 MME.   Last Oral Swab was Performed on 04/03/2024, it was consistent.      Pain Inventory Average Pain 9 Pain Right Now 9 My pain is constant, sharp, and stabbing  In the last 24 hours, has pain interfered with the following? General activity 2 Relation with others 2 Enjoyment of life 2 What TIME of day is your pain at its worst? morning , daytime, evening, and night Sleep (in general) Poor  Pain is worse with: walking, bending, sitting, inactivity, and standing Pain improves with: rest, heat/ice, and medication Relief from Meds: 6  Family History  Problem Relation Age of Onset   Colon cancer Mother        mid-50s, died of metastatic disease   Cancer Mother        liver   Coronary artery disease Mother    Diabetes type I Mother    Peripheral vascular disease Mother        Carotid disease in her 66s   Coronary artery disease Father        CAD in his 2s   Diabetes Father    Diabetes type I Father    Hypertension Father    Bipolar disorder Sister    Coronary artery disease Brother        MI in his 78s   Hypertension Brother    Hypertension Maternal Grandmother    Hyperlipidemia Maternal Grandmother    Stroke Maternal Grandmother    Hypertension Maternal Grandfather    Diabetes Maternal Grandfather    Diabetes Paternal Grandmother    Heart  disease Paternal Grandfather    Social History   Socioeconomic History   Marital status: Married    Spouse name: Not on file   Number of children: 3   Years of education: Not on file   Highest education level: Some college, no degree  Occupational History   Occupation: child care    Employer: [employer]  Tobacco Use   Smoking status: Never   Smokeless tobacco: Never  Vaping Use   Vaping status: Never Used  Substance and Sexual Activity   Alcohol use: No    Alcohol/week: 0.0 standard drinks of alcohol   Drug use: No   Sexual activity: Yes    Partners: Male    Birth control/protection: Post-menopausal  Other Topics Concern   Not on file  Social History Narrative   ** Merged History Encounter **       Social Drivers of Health   Financial Resource Strain: High Risk (12/31/2023)   Overall Financial Resource Strain (CARDIA)    Difficulty of Paying Living Expenses: Very hard  Food Insecurity: Food Insecurity Present (12/31/2023)   Hunger Vital Sign    Worried About Running Out of Food in the Last Year: Sometimes true    Ran Out of Food in the Last  Year: Sometimes true  Transportation Needs: No Transportation Needs (12/31/2023)   PRAPARE - Administrator, Civil Service (Medical): No    Lack of Transportation (Non-Medical): No  Physical Activity: Insufficiently Active (12/31/2023)   Exercise Vital Sign    Days of Exercise per Week: 1 day    Minutes of Exercise per Session: 10 min  Stress: Stress Concern Present (12/31/2023)   [employer] of Occupational Health - Occupational Stress Questionnaire    Feeling of Stress : To some extent  Social Connections: Moderately Isolated (12/31/2023)   Social Connection and Isolation Panel    Frequency of Communication with Friends and Family: More than three times a week    Frequency of Social Gatherings with Friends and Family: Once a week    Attends Religious Services: Never    Database administrator or  Organizations: No    Attends Engineer, structural: Not on file    Marital Status: Married   Past Surgical History:  Procedure Laterality Date   back surg x2     BALLOON DILATION N/A 09/22/2021   Procedure: BALLOON DILATION;  Surgeon: Cindie Carlin POUR, DO;  [facility];  Service: Endoscopy;  Laterality: N/A;   BIOPSY  09/22/2021   Procedure: BIOPSY;  Surgeon: Cindie Carlin POUR, DO;  [facility];  Service: Endoscopy;;   BIOPSY  12/23/2023   Procedure: BIOPSY;  Surgeon: Cindie Carlin POUR, DO;  [facility];  Service: Endoscopy;;   BIV ICD INSERTION CRT-D N/A 04/23/2023   Procedure: BIV ICD INSERTION CRT-D;  Surgeon: Nancey Eulas BRAVO, MD;  Location: [facility] INVASIVE CV LAB;  Service: Cardiovascular;  Laterality: N/A;   BREAST LUMPECTOMY Right 2012   CHOLECYSTECTOMY     COLONOSCOPY  11/03/07   4-mm sessile polyp removed/small internal hemorrhoids/tubular adenoma, random colon bx negative for microscopic colitis   COLONOSCOPY WITH PROPOFOL  N/A 09/17/2016   Grade 2 hemorrhoids, colonic diverticulosis, ascending colon polyp (sessile serrated adenoma), 5 year surveillance   COLONOSCOPY WITH PROPOFOL  N/A 09/22/2021   Procedure: COLONOSCOPY WITH PROPOFOL ;  Surgeon: Cindie Carlin POUR, DO;  [facility];  Service: Endoscopy;  Laterality: N/A;  7:30am   COLONOSCOPY, ESOPHAGOGASTRODUODENOSCOPY (EGD) AND ESOPHAGEAL DILATION  01/2012   mild gastritis s/p biopsy. Empiric dilation. Normal duodenum.    DILATATION & CURETTAGE/HYSTEROSCOPY WITH MYOSURE N/A 02/10/2015   Procedure: DILATATION & CURETTAGE/HYSTEROSCOPY WITH MYOSURE;  Surgeon: Bobie BRAVO Cathlyn JAYSON Nikki, MD;  Location: [facility];  Service: Gynecology;  Laterality: N/A;   DILATATION & CURETTAGE/HYSTEROSCOPY WITH MYOSURE N/A 01/24/2024   Procedure: DILATATION & CURETTAGE/HYSTEROSCOPY WITH MYOSURE;  Surgeon: Dallie Vera GAILS, MD;  [facility];  Service: Gynecology;  Laterality: N/A;  [facility]    ESOPHAGOGASTRODUODENOSCOPY (EGD) WITH PROPOFOL  N/A 01/11/2020   Normal esophagus, s/p dilation, normal stomach, normal duodenum.    ESOPHAGOGASTRODUODENOSCOPY (EGD) WITH PROPOFOL  N/A 09/22/2021   Procedure: ESOPHAGOGASTRODUODENOSCOPY (EGD) WITH PROPOFOL ;  Surgeon: Cindie Carlin POUR, DO;  [facility];  Service: Endoscopy;  Laterality: N/A;   ESOPHAGOGASTRODUODENOSCOPY (EGD) WITH PROPOFOL  N/A 12/23/2023   Procedure: ESOPHAGOGASTRODUODENOSCOPY (EGD) WITH PROPOFOL ;  Surgeon: Cindie Carlin POUR, DO;  [facility];  Service: Endoscopy;  Laterality: N/A;  11:15 am, asa 3, pt knows to arrive at 6:15   MALONEY DILATION N/A 01/11/2020   Procedure: AGAPITO DILATION;  Surgeon: Shaaron Lamar HERO, MD;  [facility];  Service: Endoscopy;  Laterality: N/A;   MM BREAST STEREO BX*L*R/S  rt.   POLYPECTOMY  09/17/2016   Procedure: POLYPECTOMY;  Surgeon: Lamar CHRISTELLA Hollingshead, MD;  [facility];  Service: Endoscopy;;  ascending colon   POLYPECTOMY  09/22/2021   Procedure: POLYPECTOMY;  Surgeon: Cindie Carlin POUR, DO;  [facility];  Service: Endoscopy;;   POLYPECTOMY  12/23/2023   Procedure: POLYPECTOMY;  Surgeon: Cindie Carlin POUR, DO;  [facility];  Service: Endoscopy;;   PORT-A-CATH REMOVAL  05/26/2012   Procedure: REMOVAL PORT-A-CATH;  Surgeon: Oneil DELENA Budge, MD;  [facility]: [facility];  Service: General;  Laterality: N/A;  Minor Room   PORTACATH PLACEMENT     Past Surgical History:  Procedure Laterality Date   back surg x2     BALLOON DILATION N/A 09/22/2021   Procedure: BALLOON DILATION;  Surgeon: Cindie Carlin POUR, DO;  [facility];  Service: Endoscopy;  Laterality: N/A;   BIOPSY  09/22/2021   Procedure: BIOPSY;  Surgeon: Cindie Carlin POUR, DO;  [facility];  Service: Endoscopy;;   BIOPSY  12/23/2023   Procedure: BIOPSY;  Surgeon: Cindie Carlin POUR, DO;  [facility];  Service: Endoscopy;;   BIV ICD INSERTION CRT-D N/A  04/23/2023   Procedure: BIV ICD INSERTION CRT-D;  Surgeon: Nancey Eulas BRAVO, MD;  Location: [facility] INVASIVE CV LAB;  Service: Cardiovascular;  Laterality: N/A;   BREAST LUMPECTOMY Right 2012   CHOLECYSTECTOMY     COLONOSCOPY  11/03/07   4-mm sessile polyp removed/small internal hemorrhoids/tubular adenoma, random colon bx negative for microscopic colitis   COLONOSCOPY WITH PROPOFOL  N/A 09/17/2016   Grade 2 hemorrhoids, colonic diverticulosis, ascending colon polyp (sessile serrated adenoma), 5 year surveillance   COLONOSCOPY WITH PROPOFOL  N/A 09/22/2021   Procedure: COLONOSCOPY WITH PROPOFOL ;  Surgeon: Cindie Carlin POUR, DO;  [facility];  Service: Endoscopy;  Laterality: N/A;  7:30am   COLONOSCOPY, ESOPHAGOGASTRODUODENOSCOPY (EGD) AND ESOPHAGEAL DILATION  01/2012   mild gastritis s/p biopsy. Empiric dilation. Normal duodenum.    DILATATION & CURETTAGE/HYSTEROSCOPY WITH MYOSURE N/A 02/10/2015   Procedure: DILATATION & CURETTAGE/HYSTEROSCOPY WITH MYOSURE;  Surgeon: Bobie BRAVO Cathlyn JAYSON Nikki, MD;  Location: [facility];  Service: Gynecology;  Laterality: N/A;   DILATATION & CURETTAGE/HYSTEROSCOPY WITH MYOSURE N/A 01/24/2024   Procedure: DILATATION & CURETTAGE/HYSTEROSCOPY WITH MYOSURE;  Surgeon: Dallie Vera GAILS, MD;  [facility];  Service: Gynecology;  Laterality: N/A;  [facility]   ESOPHAGOGASTRODUODENOSCOPY (EGD) WITH PROPOFOL  N/A 01/11/2020   Normal esophagus, s/p dilation, normal stomach, normal duodenum.    ESOPHAGOGASTRODUODENOSCOPY (EGD) WITH PROPOFOL  N/A 09/22/2021   Procedure: ESOPHAGOGASTRODUODENOSCOPY (EGD) WITH PROPOFOL ;  Surgeon: Cindie Carlin POUR, DO;  [facility];  Service: Endoscopy;  Laterality: N/A;   ESOPHAGOGASTRODUODENOSCOPY (EGD) WITH PROPOFOL  N/A 12/23/2023   Procedure: ESOPHAGOGASTRODUODENOSCOPY (EGD) WITH PROPOFOL ;  Surgeon: Cindie Carlin POUR, DO;  [facility];  Service: Endoscopy;  Laterality: N/A;  11:15 am, asa 3, pt knows to arrive at 6:15   MALONEY  DILATION N/A 01/11/2020   Procedure: AGAPITO DILATION;  Surgeon: Hollingshead Lamar CHRISTELLA, MD;  [facility];  Service: Endoscopy;  Laterality: N/A;   MM BREAST STEREO BX*L*R/S     rt.   POLYPECTOMY  09/17/2016   Procedure: POLYPECTOMY;  Surgeon: Lamar CHRISTELLA Hollingshead, MD;  [facility];  Service: Endoscopy;;  ascending colon   POLYPECTOMY  09/22/2021   Procedure: POLYPECTOMY;  Surgeon: Cindie Carlin POUR, DO;  [facility];  Service: Endoscopy;;   POLYPECTOMY  12/23/2023  Procedure: POLYPECTOMY;  Surgeon: Cindie Carlin POUR, DO;  [facility];  Service: Endoscopy;;   PORT-A-CATH REMOVAL  05/26/2012   Procedure: REMOVAL PORT-A-CATH;  Surgeon: Oneil DELENA Budge, MD;  Location: [facility];  Service: General;  Laterality: N/A;  Minor Room   PORTACATH PLACEMENT     Past Medical History:  Diagnosis Date   AICD (automatic cardioverter/defibrillator) present    Anxiety    Arthritis    Blood transfusion without reported diagnosis 1999   due to heavy menses   Breast cancer (HCC) 05/22/2011   03/01/11, Stage 2, s/p lumpectomy, chemo/xrt   CHF (congestive heart failure) (HCC)    Complication of anesthesia    pt woke up during procedure in colonoscopy/endoscopy   DDD (degenerative disc disease), lumbar    Depression 06/22/2011   Diverticulitis    DM (diabetes mellitus) (HCC) 06/22/2011   Genital warts    GERD (gastroesophageal reflux disease) 06/22/2011   Hx of adenomatous colonic polyps 10/2007   4mm sigmoid tubular adenoma, FH colon cancer, mother in mid-50s   Hypercholesterolemia 06/22/2011   Hypertension 06/22/2011   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   Iron deficiency anemia 03/25/2015   LBBB (left bundle branch block)    Mild CAD    Morbid obesity (HCC)    NICM (nonischemic cardiomyopathy) (HCC)    Personal history of chemotherapy    Personal history of radiation therapy    Presence of permanent cardiac pacemaker    PSVT (paroxysmal  supraventricular tachycardia) (HCC)    Shortness of breath    STD (sexually transmitted disease)    HPV, 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   Syncope    Syncope    Vaginal bleeding 03/08/2014   LMP 04/05/2016 Comment: spotting 11/2016 and 12/2016  Opioid Risk Score:   Fall Risk Score:  `1  Depression screen PHQ 2/9     04/03/2024    3:00 PM 01/02/2024    3:06 PM 11/14/2023    2:18 PM 09/06/2023    1:01 PM 07/18/2023    9:01 AM 03/15/2023   12:47 PM 03/14/2023    3:16 PM  Depression screen PHQ 2/9  Decreased Interest 3 2 1 1 1 1 1   Down, Depressed, Hopeless 2 2 1 1 1 1 1   PHQ - 2 Score 5 4 2 2 2 2 2   Altered sleeping 2 3   1  2   Tired, decreased energy 2 3   2  2   Change in appetite 0 2   0  0  Feeling bad or failure about yourself  3 2   1   0  Trouble concentrating 1 1   1  2   Moving slowly or fidgety/restless 0 0   0  0  Suicidal thoughts 0 0   0  0  PHQ-9 Score 13 15   7  8   Difficult doing work/chores Very difficult Somewhat difficult     Somewhat difficult    Review of Systems  Musculoskeletal:  Positive for back pain and neck pain.       Pain in both knees, pain in both upper arms, pain both legs  All other systems reviewed and are negative.      Objective:   Physical Exam Vitals and nursing note reviewed.  Constitutional:      Appearance: Normal appearance.  Cardiovascular:     Rate and Rhythm: Normal rate and regular rhythm.     Pulses: Normal pulses.  Heart sounds: Normal heart sounds.  Pulmonary:     Effort: Pulmonary effort is normal.     Breath sounds: Normal breath sounds.  Musculoskeletal:     Comments: Normal Muscle Bulk and Muscle Testing Reveals:  Upper Extremities: Right: Decreased ROM 45 Degrees  and Muscle Strength 5/5 Left Upper extremity: Decreased ROM 90 Degrees and Muscle Strength 5/5 Bilateral AC Joint Tenderness: L>R  Thoracic Hypersensitivity: T-1-T-4   Lumbar Hypersensitivity Bilateral Greater Trochanter Tenderness: L>R Lower  Extremities : Full ROM and Muscle Strength 5/5 Arises from Table slowly, using cane for support Antalgic Gait     Skin:    General: Skin is warm and dry.  Neurological:     Mental Status: She is alert and oriented to person, place, and time.  Psychiatric:        Mood and Affect: Mood normal.        Behavior: Behavior normal.          Assessment & Plan:  Cervicalgia: Cervical Myelopathy: She is scheduled for Myelogram> Continue to Monitor. Lumbar Radiculitis: Continue HEP as Tolerated. Continue current Medication Regimen. Continue to Monitor.06/04/2024 Chronic Bilateral Knee Pain: L>R: No complaints today. Ortho Following Continue to Monitor. 06/04/2024 Chronic Pain Syndrome: Continue: Hydrocodone  5/325 mg  one tablet every 6 hours as needed for pain #120. We will continue the opioid monitoring program, this consists of regular clinic visits, examinations, urine drug screen, pill counts as well as use of [place]  Controlled Substance Reporting system. A 12 month History has been reviewed on the [place]  Controlled Substance Reporting System on 06/04/2024 F/U with Dr. Lovorn in 2 months

## 2024-06-02 NOTE — Telephone Encounter (Signed)
 Ms [name] called for a refill on her hydrocodone  .

## 2024-06-03 ENCOUNTER — Ambulatory Visit: Payer: Self-pay | Admitting: Physical Medicine and Rehabilitation

## 2024-06-03 ENCOUNTER — Other Ambulatory Visit: Payer: Self-pay | Admitting: Physical Medicine and Rehabilitation

## 2024-06-03 DIAGNOSIS — G959 Disease of spinal cord, unspecified: Secondary | ICD-10-CM

## 2024-06-03 MED ORDER — HYDROCODONE-ACETAMINOPHEN 5-325 MG PO TABS: 1.0000 | ORAL_TABLET | Freq: Four times a day (QID) | ORAL | 0 refills | Status: DC | PRN

## 2024-06-03 NOTE — Telephone Encounter (Signed)
 Went over imaging.   The Imaging shows 2 spots with mild to moderate stenosis-   Is terrified of doing more surgery could make it worse-   Also having more pain in Ue's- and making it hard to  use them. And stiffness.   Still having intermittent bowel and bladder incontinence.  Has bowel incontinence- every day now. Has severe urgency-and causes her to have bowel accidents.     CT myelogram - not done- only CT with contrast-   Said was on report, but not done  Spoke ot Dr Louis- needs the myelogram to be done

## 2024-06-04 ENCOUNTER — Encounter: Attending: Physical Medicine and Rehabilitation | Admitting: Registered Nurse

## 2024-06-04 ENCOUNTER — Encounter: Payer: Self-pay | Admitting: Registered Nurse

## 2024-06-04 VITALS — BP 143/84 | HR 87 | Ht 64.0 in | Wt 231.0 lb

## 2024-06-04 DIAGNOSIS — M5416 Radiculopathy, lumbar region: Secondary | ICD-10-CM | POA: Diagnosis not present

## 2024-06-04 DIAGNOSIS — G894 Chronic pain syndrome: Secondary | ICD-10-CM | POA: Insufficient documentation

## 2024-06-04 DIAGNOSIS — M4714 Other spondylosis with myelopathy, thoracic region: Secondary | ICD-10-CM | POA: Diagnosis not present

## 2024-06-04 DIAGNOSIS — Z5181 Encounter for therapeutic drug level monitoring: Secondary | ICD-10-CM | POA: Insufficient documentation

## 2024-06-04 DIAGNOSIS — Z79891 Long term (current) use of opiate analgesic: Secondary | ICD-10-CM | POA: Diagnosis not present

## 2024-06-04 DIAGNOSIS — G959 Disease of spinal cord, unspecified: Secondary | ICD-10-CM | POA: Insufficient documentation

## 2024-06-04 NOTE — Patient Instructions (Signed)
 Per DR Cornelio Instructions:   You are scheduled for   CT myelogram-   Please use the Cane at all Times     Will discuss with Dr Cornelio regarding her instructions of Baclofen , and that you are afraid to take the Baclofen    Dr Cornelio Instructions of Baclofen    Baclofen - 5 mg- 3x/day-  constipation and sedation- rare at the doses I'm giving you-  1st dose, 1st hour- be with someone- I don't foresee anything.    5.  No falls and no car accidents! No climbing ladders, etc.

## 2024-06-05 ENCOUNTER — Telehealth: Payer: Self-pay | Admitting: Registered Nurse

## 2024-06-05 NOTE — Telephone Encounter (Signed)
 Spoke with Dr Lovorn regarding [name] pain and only receiving one hour relief with her current medication regimen. She agrees with increasing her Hydrocodone  to 7.5 mg every 6 hours as needed for pain. Placed a call toMs. [name] no answer, left message to return the call.

## 2024-06-05 NOTE — Telephone Encounter (Signed)
 [name]  instructed to take Hydrocodone  5 mg/ 325 mg one and half tablet  ( 7.5 mg) every 6 hours as needed for pain . She will send a My-Chart message with update on Tuesday 06/09/2024. She verbalizes understanding.

## 2024-06-07 ENCOUNTER — Other Ambulatory Visit: Payer: Self-pay | Admitting: Nurse Practitioner

## 2024-06-07 DIAGNOSIS — E1165 Type 2 diabetes mellitus with hyperglycemia: Secondary | ICD-10-CM

## 2024-06-08 ENCOUNTER — Other Ambulatory Visit: Payer: Self-pay

## 2024-06-08 DIAGNOSIS — E1165 Type 2 diabetes mellitus with hyperglycemia: Secondary | ICD-10-CM

## 2024-06-08 MED ORDER — TRULICITY 0.75 MG/0.5ML ~~LOC~~ SOAJ: SUBCUTANEOUS | 3 refills | Status: DC

## 2024-06-10 ENCOUNTER — Other Ambulatory Visit: Payer: Self-pay | Admitting: Family Medicine

## 2024-06-12 ENCOUNTER — Other Ambulatory Visit: Payer: Self-pay | Admitting: Family Medicine

## 2024-06-12 DIAGNOSIS — F324 Major depressive disorder, single episode, in partial remission: Secondary | ICD-10-CM

## 2024-06-15 ENCOUNTER — Other Ambulatory Visit

## 2024-06-15 NOTE — Discharge Instructions (Signed)

## 2024-06-15 NOTE — Addendum Note (Signed)
 Addended by: VICCI SELLER A on: 06/15/2024 01:21 PM   Modules accepted: Orders

## 2024-06-15 NOTE — Progress Notes (Signed)
 Remote ICD transmission.

## 2024-06-16 ENCOUNTER — Ambulatory Visit
Admission: RE | Admit: 2024-06-16 | Discharge: 2024-06-16 | Disposition: A | Discharge status: Home / Self Care | Source: Ambulatory Visit | Attending: Physical Medicine and Rehabilitation | Admitting: Physical Medicine and Rehabilitation

## 2024-06-16 ENCOUNTER — Inpatient Hospital Stay
Admission: RE | Admit: 2024-06-16 | Discharge: 2024-06-16 | Disposition: A | Discharge status: Home / Self Care | Source: Ambulatory Visit | Attending: Physical Medicine and Rehabilitation | Admitting: Physical Medicine and Rehabilitation

## 2024-06-16 DIAGNOSIS — G959 Disease of spinal cord, unspecified: Secondary | ICD-10-CM

## 2024-06-16 DIAGNOSIS — M4802 Spinal stenosis, cervical region: Secondary | ICD-10-CM | POA: Diagnosis not present

## 2024-06-16 DIAGNOSIS — M4804 Spinal stenosis, thoracic region: Secondary | ICD-10-CM | POA: Diagnosis not present

## 2024-06-16 DIAGNOSIS — M5124 Other intervertebral disc displacement, thoracic region: Secondary | ICD-10-CM | POA: Diagnosis not present

## 2024-06-16 MED ORDER — DIAZEPAM 5 MG PO TABS
10.0000 mg | ORAL_TABLET | Freq: Once | ORAL | Status: AC
  Administered 2024-06-16: 5 mg via ORAL

## 2024-06-16 MED ORDER — IOPAMIDOL (ISOVUE-M 300) INJECTION 61%
10.0000 mL | Freq: Once | INTRAMUSCULAR | Status: AC
  Administered 2024-06-16: 10 mL via INTRATHECAL

## 2024-06-16 MED ORDER — MEPERIDINE HCL 50 MG/ML IJ SOLN: 50.0000 mg | Freq: Once | INTRAMUSCULAR | Status: DC | PRN

## 2024-06-16 MED ORDER — ONDANSETRON HCL 4 MG/2ML IJ SOLN: 4.0000 mg | Freq: Once | INTRAMUSCULAR | Status: DC | PRN

## 2024-06-18 ENCOUNTER — Inpatient Hospital Stay: Payer: Medicaid Other

## 2024-06-18 ENCOUNTER — Ambulatory Visit (HOSPITAL_COMMUNITY): Payer: Medicaid Other

## 2024-06-23 ENCOUNTER — Ambulatory Visit: Admitting: Obstetrics and Gynecology

## 2024-06-25 ENCOUNTER — Inpatient Hospital Stay: Payer: Medicaid Other | Admitting: Oncology

## 2024-06-25 ENCOUNTER — Ambulatory Visit: Admitting: Adult Health

## 2024-06-30 ENCOUNTER — Other Ambulatory Visit: Payer: Self-pay | Admitting: Family Medicine

## 2024-06-30 ENCOUNTER — Other Ambulatory Visit: Payer: Self-pay

## 2024-06-30 DIAGNOSIS — Z419 Encounter for procedure for purposes other than remedying health state, unspecified: Secondary | ICD-10-CM | POA: Diagnosis not present

## 2024-06-30 DIAGNOSIS — J302 Other seasonal allergic rhinitis: Secondary | ICD-10-CM

## 2024-06-30 MED ORDER — HYDROCODONE-ACETAMINOPHEN 5-325 MG PO TABS: 1.0000 | ORAL_TABLET | Freq: Four times a day (QID) | ORAL | 0 refills | Status: DC | PRN

## 2024-07-02 ENCOUNTER — Ambulatory Visit: Admitting: Oncology

## 2024-07-02 ENCOUNTER — Ambulatory Visit (HOSPITAL_COMMUNITY): Payer: Self-pay

## 2024-07-02 ENCOUNTER — Inpatient Hospital Stay: Payer: Self-pay

## 2024-07-07 ENCOUNTER — Ambulatory Visit: Admitting: Cardiovascular Disease

## 2024-07-08 ENCOUNTER — Other Ambulatory Visit: Payer: Self-pay

## 2024-07-08 DIAGNOSIS — C50911 Malignant neoplasm of unspecified site of right female breast: Secondary | ICD-10-CM

## 2024-07-08 DIAGNOSIS — E559 Vitamin D deficiency, unspecified: Secondary | ICD-10-CM

## 2024-07-09 ENCOUNTER — Ambulatory Visit (HOSPITAL_COMMUNITY)
Admission: RE | Admit: 2024-07-09 | Discharge: 2024-07-09 | Disposition: A | Discharge status: Home / Self Care | Payer: Self-pay | Source: Ambulatory Visit | Attending: Oncology | Admitting: Oncology

## 2024-07-09 ENCOUNTER — Inpatient Hospital Stay: Attending: Oncology

## 2024-07-09 DIAGNOSIS — Z1231 Encounter for screening mammogram for malignant neoplasm of breast: Secondary | ICD-10-CM | POA: Diagnosis not present

## 2024-07-09 DIAGNOSIS — E559 Vitamin D deficiency, unspecified: Secondary | ICD-10-CM | POA: Diagnosis not present

## 2024-07-09 DIAGNOSIS — C50911 Malignant neoplasm of unspecified site of right female breast: Secondary | ICD-10-CM

## 2024-07-09 DIAGNOSIS — Z853 Personal history of malignant neoplasm of breast: Secondary | ICD-10-CM | POA: Diagnosis not present

## 2024-07-09 DIAGNOSIS — G620 Drug-induced polyneuropathy: Secondary | ICD-10-CM | POA: Diagnosis not present

## 2024-07-09 LAB — CBC WITH DIFFERENTIAL/PLATELET
Abs Immature Granulocytes: 0.01 K/uL (ref 0.00–0.07)
Basophils Absolute: 0.1 K/uL (ref 0.0–0.1)
Basophils Relative: 1 %
Eosinophils Absolute: 0.2 K/uL (ref 0.0–0.5)
Eosinophils Relative: 3 %
HCT: 44.6 % (ref 36.0–46.0)
Hemoglobin: 14.1 g/dL (ref 12.0–15.0)
Immature Granulocytes: 0 %
Lymphocytes Relative: 41 %
Lymphs Abs: 2.2 K/uL (ref 0.7–4.0)
MCH: 27.8 pg (ref 26.0–34.0)
MCHC: 31.6 g/dL (ref 30.0–36.0)
MCV: 87.8 fL (ref 80.0–100.0)
Monocytes Absolute: 0.5 K/uL (ref 0.1–1.0)
Monocytes Relative: 9 %
Neutro Abs: 2.4 K/uL (ref 1.7–7.7)
Neutrophils Relative %: 46 %
Platelets: 203 K/uL (ref 150–400)
RBC: 5.08 MIL/uL (ref 3.87–5.11)
RDW: 16.2 % — ABNORMAL HIGH (ref 11.5–15.5)
WBC: 5.3 K/uL (ref 4.0–10.5)
nRBC: 0 % (ref 0.0–0.2)

## 2024-07-09 LAB — COMPREHENSIVE METABOLIC PANEL WITH GFR
ALT: 20 U/L (ref 0–44)
AST: 21 U/L (ref 15–41)
Albumin: 3.8 g/dL (ref 3.5–5.0)
Alkaline Phosphatase: 78 U/L (ref 38–126)
Anion gap: 12 (ref 5–15)
BUN: 7 mg/dL (ref 6–20)
CO2: 25 mmol/L (ref 22–32)
Calcium: 9 mg/dL (ref 8.9–10.3)
Chloride: 104 mmol/L (ref 98–111)
Creatinine, Ser: 0.77 mg/dL (ref 0.44–1.00)
GFR, Estimated: >60 mL/min (ref >60)
Glucose, Bld: 119 mg/dL — ABNORMAL HIGH (ref 70–99)
Potassium: 3.6 mmol/L (ref 3.5–5.1)
Sodium: 141 mmol/L (ref 135–145)
Total Bilirubin: 0.7 mg/dL (ref 0.0–1.2)
Total Protein: 7.6 g/dL (ref 6.5–8.1)

## 2024-07-09 LAB — VITAMIN D 25 HYDROXY (VIT D DEFICIENCY, FRACTURES): Vit D, 25-Hydroxy: 95.72 ng/mL (ref 30–100)

## 2024-07-10 ENCOUNTER — Encounter: Payer: Self-pay | Admitting: Radiology

## 2024-07-14 ENCOUNTER — Ambulatory Visit: Admitting: Obstetrics and Gynecology

## 2024-07-14 ENCOUNTER — Inpatient Hospital Stay: Payer: Self-pay | Admitting: Oncology

## 2024-07-16 ENCOUNTER — Inpatient Hospital Stay: Admitting: Oncology

## 2024-07-16 ENCOUNTER — Other Ambulatory Visit: Payer: Self-pay | Admitting: Family Medicine

## 2024-07-16 DIAGNOSIS — E559 Vitamin D deficiency, unspecified: Secondary | ICD-10-CM | POA: Insufficient documentation

## 2024-07-16 DIAGNOSIS — G620 Drug-induced polyneuropathy: Secondary | ICD-10-CM | POA: Insufficient documentation

## 2024-07-16 NOTE — Assessment & Plan Note (Addendum)
-   Mammogram from 07/09/2024 was read as BI-RADS Category 1 negative.  Recommend follow-up in 12 months. - Labs from 06/13/2023 show a normal CBC.

## 2024-07-16 NOTE — Progress Notes (Unsigned)
 [facility] OFFICE PROGRESS NOTE  Cook, Jayce G, DO  ASSESSMENT & PLAN:    Assessment & Plan 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HCC) - Mammogram from 07/09/2024 was read as BI-RADS Category 1 negative.  Recommend follow-up in 12 months. - Labs from 06/13/2023 show a normal CBC.  Chemotherapy-induced neuropathy (HCC) - Numbness in the extremities is stable.  No indication for gabapentin .  Vitamin D  deficiency  Vitamin D  is 73.45.  -Currently on ergocalciferol  50000 units weekly.  Recommend taking every other week.  Will recheck vitamin D  level at next clinic visit.  No orders of the defined types were placed in this encounter.   INTERVAL HISTORY: Patient returns for follow-up for right breast cancer.  She recently had a mammogram on 07/09/2024 which was negative.  In the interim, patient has been dealing with pain due to cervical myelopathy, lumbar radiculitis and chronic bilateral knee pain left greater than right.  She will be having a CT myelogram in the near future.  She uses a cane for ambulation.  She is on narcotics for pain management.  Her hydrocodone  was recently increased due to unrelieved pain.  She has had lumbar surgery in the past.  Has some neurogenic bowel and bladder.  Has urgency.  Patient has a Facilities manager.  She was evaluated on 03/25/2024 for vertigo which was relieved with IV medications, 02/06/2024 for cough and ear pain (COVID and flu negative) and treated for viral infection,  She had hysteroscopy D&C on 01/24/2024 for postmenopausal bleeding.  Patient had EGD due to dyspepsia with Dr. Cindie on 12/23/2023 which showed gastritis and several gastric polyps which were removed.  Pathology was negative for H. pylori and polyps showed hyperplastic with erosion and granulation tissue but negative for dysplasia.   We reviewed CBC, CMP vitamin D .  SUMMARY OF HEMATOLOGIC HISTORY: Oncology History  Invasive ductal carcinoma of right  breast (HCC)  02/28/2011 Initial Diagnosis   Right needle core biopsy demonstrating Invasive ductal carcinoma of right breast   03/21/2011 Surgery   Right lumpectomy demonstrating a 3.8 cm invasive ductal carcinoma, grade III, no LVI, and 0/1 lymph nodes   05/11/2011 - 07/13/2011 Chemotherapy   AC x 4   08/03/2011 - 10/19/2011 Chemotherapy   Paclitaxel  weekly x 12   11/07/2011 - 12/31/2011 Radiation Therapy     02/10/2015 Procedure   Hysteroscopy with dilation and curettage by Dr. Bobie Cary.   02/10/2015 Pathology Results   Diagnosis Endometrium, curettage - ABUNDANT BLOOD AND DEGENERATIVE SECRETORY ENDOMETRIUM WITH STROMAL BREAKDOWN (VERY LIMITED MATERIAL). - INFLAMED SQUAMOUS EPITHELIUM AND ENDOCERVICAL MUCOSA, NO DYSPLASIA OR MALIGNANCY.    -Diagnosed in April 2012, ER negative, PR 2% positive, Ki-67 90%, HER-2 negative. -Status post lumpectomy followed by adjuvant chemotherapy with AC x4 followed by 12 weekly paclitaxel  completed on 10/19/2011. -Adjuvant breast radiation completed on 12/31/2011. -Tamoxifen  was likely not recommended because of low positivity for PR. -Mammogram on 04/25/2020 was BI-RADS 2 category.  CBC    Component Value Date/Time   WBC 5.3 07/09/2024 0752   RBC 5.08 07/09/2024 0752   HGB 14.1 07/09/2024 0752   HGB 14.1 04/16/2023 1558   HGB 10.0 (L) 02/10/2015 0848   HCT 44.6 07/09/2024 0752   HCT 44.5 04/16/2023 1558   PLT 203 07/09/2024 0752   PLT 246 04/16/2023 1558   MCV 87.8 07/09/2024 0752   MCV 86 04/16/2023 1558   MCH 27.8 07/09/2024 0752   MCHC 31.6 07/09/2024 0752   RDW 16.2 (H)  07/09/2024 0752   RDW 15.9 (H) 04/16/2023 1558   LYMPHSABS 2.2 07/09/2024 0752   LYMPHSABS 2.3 04/16/2023 1558   MONOABS 0.5 07/09/2024 0752   EOSABS 0.2 07/09/2024 0752   EOSABS 0.2 04/16/2023 1558   BASOSABS 0.1 07/09/2024 0752   BASOSABS 0.0 04/16/2023 1558       Latest Ref Rng & Units 07/09/2024    7:52 AM 03/25/2024    4:15 PM 01/24/2024    8:30 AM  CMP   Glucose 70 - 99 mg/dL 880  93  861   BUN 6 - 20 mg/dL 7  8  7    Creatinine 0.44 - 1.00 mg/dL 9.22  9.02  9.18   Sodium 135 - 145 mmol/L 141  136  140   Potassium 3.5 - 5.1 mmol/L 3.6  3.9  3.5   Chloride 98 - 111 mmol/L 104  99  107   CO2 22 - 32 mmol/L 25  26  21    Calcium  8.9 - 10.3 mg/dL 9.0  9.5  8.8   Total Protein 6.5 - 8.1 g/dL 7.6  8.0    Total Bilirubin 0.0 - 1.2 mg/dL 0.7  0.6    Alkaline Phos 38 - 126 U/L 78  65    AST 15 - 41 U/L 21  23    ALT 0 - 44 U/L 20  22       Lab Results  Component Value Date   FERRITIN 29 10/23/2023    There were no vitals filed for this visit.  Review of System:  ROS  Physical Exam: Physical Exam Constitutional:      Appearance: Normal appearance.  HENT:     Head: Normocephalic and atraumatic.  Eyes:     Pupils: Pupils are equal, round, and reactive to light.  Cardiovascular:     Rate and Rhythm: Normal rate and regular rhythm.     Heart sounds: Normal heart sounds. No murmur heard. Pulmonary:     Effort: Pulmonary effort is normal.     Breath sounds: Normal breath sounds. No wheezing.  Abdominal:     General: Bowel sounds are normal. There is no distension.     Palpations: Abdomen is soft.     Tenderness: There is no abdominal tenderness.  Musculoskeletal:        General: Normal range of motion.     Cervical back: Normal range of motion.  Skin:    General: Skin is warm and dry.     Findings: No rash.  Neurological:     Mental Status: She is alert and oriented to person, place, and time.     Gait: Gait is intact.  Psychiatric:        Mood and Affect: Mood and affect normal.        Cognition and Memory: Memory normal.        Judgment: Judgment normal.      I spent *** minutes dedicated to the care of this patient (face-to-face and non-face-to-face) on the date of the encounter to include what is described in the assessment and plan.,  Delon Hope, NP 07/16/2024 8:04 AM

## 2024-07-16 NOTE — Assessment & Plan Note (Addendum)
 Vitamin D  is 73.45.  -Currently on ergocalciferol  50000 units weekly.  Recommend taking every other week.  Will recheck vitamin D  level at next clinic visit.

## 2024-07-16 NOTE — Assessment & Plan Note (Addendum)
-   Numbness in the extremities is stable.  No indication for gabapentin .

## 2024-07-21 ENCOUNTER — Encounter: Payer: Self-pay | Admitting: Nurse Practitioner

## 2024-07-21 ENCOUNTER — Ambulatory Visit: Admitting: Nurse Practitioner

## 2024-07-21 VITALS — BP 132/83 | HR 77 | Temp 97.3°F | Ht 64.0 in | Wt 228.0 lb

## 2024-07-21 DIAGNOSIS — R110 Nausea: Secondary | ICD-10-CM | POA: Diagnosis not present

## 2024-07-21 DIAGNOSIS — N760 Acute vaginitis: Secondary | ICD-10-CM

## 2024-07-21 DIAGNOSIS — B9689 Other specified bacterial agents as the cause of diseases classified elsewhere: Secondary | ICD-10-CM | POA: Insufficient documentation

## 2024-07-21 DIAGNOSIS — R300 Dysuria: Secondary | ICD-10-CM | POA: Insufficient documentation

## 2024-07-21 DIAGNOSIS — B3731 Acute candidiasis of vulva and vagina: Secondary | ICD-10-CM | POA: Diagnosis not present

## 2024-07-21 DIAGNOSIS — E119 Type 2 diabetes mellitus without complications: Secondary | ICD-10-CM | POA: Diagnosis not present

## 2024-07-21 LAB — POCT URINALYSIS DIP (CLINITEK)
Bilirubin, UA: NEGATIVE
Glucose, UA: >=1000 mg/dL — AB
Ketones, POC UA: NEGATIVE mg/dL
Nitrite, UA: NEGATIVE
Spec Grav, UA: 1.015 (ref 1.010–1.025)
Urobilinogen, UA: 0.2 U/dL
pH, UA: 6 (ref 5.0–8.0)

## 2024-07-21 LAB — POCT GLUCOSE FINGERSTICK: Glucose: 92 (ref 70–99)

## 2024-07-21 MED ORDER — CLINDAMYCIN PHOSPHATE 2 % VA CREA
1.0000 | TOPICAL_CREAM | Freq: Every day | VAGINAL | 0 refills | Status: DC
Start: 2024-07-21 — End: 2024-07-28

## 2024-07-21 MED ORDER — FLUCONAZOLE 150 MG PO TABS: ORAL_TABLET | ORAL | 0 refills | Status: DC

## 2024-07-21 NOTE — Progress Notes (Unsigned)
 Subjective:    Patient ID: [name], female    DOB: May 21, 1966, 58 y.o.   MRN: [id number]  CC: Patient arrived for a follow up exam  Upon arrival, patient presented concern with constant N/V and only getting relief after using PRN zofran , in which she uses every day Q6H. In addition, she stated that she was having vaginal itching for the past 2 weeks, brown discharge, cloudy urine and vaginal odor.   Activity: minimal due to chronic pain issues Diet: decreased appetite but lots of fluid intake. Eye exam: Completed in 2024 and insurance suggest Q2Y now             Review of Systems  Constitutional:  Positive for appetite change.  Respiratory:  Negative for chest tightness, shortness of breath and wheezing.   Cardiovascular:  Negative for chest pain.  Gastrointestinal:  Positive for nausea. Negative for constipation, diarrhea and vomiting.  Endocrine: Positive for polyuria.  Genitourinary:  Positive for dysuria, frequency and vaginal discharge.  Musculoskeletal:  Positive for back pain and gait problem.       Patient reports chronic back pain as chronic and the usage of a cane when walking.    Vitals:   07/21/24 1446  BP: 132/83  Pulse: 77  Temp: (!) 97.3 F (36.3 C)  Height: 5' 4 (1.626 m)  Weight: 103.4 kg  SpO2: 98%  BMI (Calculated): 39.12       07/21/2024    2:52 PM 01/02/2024    3:07 PM 07/18/2023    9:01 AM 03/14/2023    3:16 PM  GAD 7 : Generalized Anxiety Score  Nervous, Anxious, on Edge 2 2 2 2   Control/stop worrying 3 2 2 3   Worry too much - different things 3 2 2  0  Trouble relaxing 2 2 2 2   Restless 1 2 1 1   Easily annoyed or irritable 2 2 1 1   Afraid - awful might happen 1 1 1 1   Total GAD 7 Score 14 13 11 10   Anxiety Difficulty Somewhat difficult Somewhat difficult  Somewhat difficult        07/21/2024    2:52 PM 06/04/2024    1:43 PM 04/03/2024    3:00 PM  Depression screen PHQ 2/9  Decreased Interest 2 1 3   Down, Depressed, Hopeless 2 1  2   PHQ - 2 Score 4 2 5   Altered sleeping 1  2  Tired, decreased energy 3  2  Change in appetite 1  0  Feeling bad or failure about yourself  1  3  Trouble concentrating 1  1  Moving slowly or fidgety/restless 1  0  Suicidal thoughts 0  0  PHQ-9 Score 12  13  Difficult doing work/chores Very difficult  Very difficult        Objective:   Physical Exam Vitals and nursing note reviewed.  Constitutional:      Appearance: Normal appearance.  Neck:     Comments: Thyroid  palpated - soft, nontender. Cardiovascular:     Rate and Rhythm: Normal rate and regular rhythm.     Heart sounds: Normal heart sounds.  Pulmonary:     Effort: Pulmonary effort is normal.     Breath sounds: Normal breath sounds.  Abdominal:     Palpations: Abdomen is soft.     Tenderness: There is abdominal tenderness.     Comments: Tenderness during palpation in the epigastric area.  Genitourinary:    Vagina: Vaginal discharge present.  Comments: Kidneys palpated - No tenderness reported.  Skin:    General: Skin is warm and dry.  Neurological:     Mental Status: She is alert.  Psychiatric:        Mood and Affect: Mood normal.        Behavior: Behavior normal.   Pelvic exam: VULVA: {details:315901::normal appearing vulva with no masses, tenderness or lesions}, VAGINA: vaginal discharge - milky, WET MOUNT done - results: {:315123}, CERVIX: {details:315904::normal appearing cervix without discharge or lesions}, exam chaperoned by Elveria Quarry.         Assessment & Plan:  Lab Orders         POCT URINALYSIS DIP (CLINITEK)         POCT Glucose Fingerstick       Vaginal candidiasis Educated on new order of Diflucan . Instructed to take as directed.   Bacterial vaginosis Educated on New order of Clindamycin  gel. Instructed to take as directed.    Nausea Patient was instructed to discontinue Jardiance  for 3-5 days. Follow up with office to see if the nausea subsides  Obesity Weight Chart reviewed  with patient. She was educated on her steady weight lost since 03/2024. Encouraged to continue plan with medications and health diet for now  Meds ordered this encounter  Medications   fluconazole  (DIFLUCAN ) 150 MG tablet    Sig: One po qd prn yeast infection; may repeat in 3-4 days if needed    Dispense:  2 tablet    Refill:  0    Supervising Provider:   ALPHONSA HAMILTON A [[id number]]   clindamycin  (CLEOCIN ) 2 % vaginal cream    Sig: Place 1 Applicatorful vaginally at bedtime. For 7 days.    Dispense:  40 g    Refill:  0    Supervising Provider:   ALPHONSA HAMILTON A [[id number]]    Follow up with office in 5 days to determine future plan of nausea and Jardiance . Return in about 3 months (around 10/20/2024).

## 2024-07-21 NOTE — Assessment & Plan Note (Signed)
 Educated on [place] order of Clindamycin  gel. Instructed to take as directed.

## 2024-07-21 NOTE — Assessment & Plan Note (Addendum)
ow

## 2024-07-21 NOTE — Assessment & Plan Note (Signed)
 Educated on new order of Diflucan . Instructed to take as directed.

## 2024-07-22 ENCOUNTER — Ambulatory Visit (INDEPENDENT_AMBULATORY_CARE_PROVIDER_SITE_OTHER): Payer: Medicaid Other

## 2024-07-22 ENCOUNTER — Telehealth: Payer: Self-pay | Admitting: Pharmacy Technician

## 2024-07-22 ENCOUNTER — Other Ambulatory Visit (HOSPITAL_COMMUNITY): Payer: Self-pay

## 2024-07-22 ENCOUNTER — Encounter

## 2024-07-22 DIAGNOSIS — I428 Other cardiomyopathies: Secondary | ICD-10-CM | POA: Diagnosis not present

## 2024-07-22 NOTE — Telephone Encounter (Signed)
 Pharmacy Patient Advocate Encounter   Received notification from CoverMyMeds that prior authorization for Clindamycin  Phosphate 2% cream is required/requested.   Insurance verification completed.   The patient is insured through [facility] MEDICAID .   Per test claim:  see below is preferred by the insurance.  If suggested medication is appropriate, Please send in a new RX and discontinue this one. If not, please advise as to why it's not appropriate so that we may request a Prior Authorization. Please note, some preferred medications may still require a PA.  If the suggested medications have not been trialed and there are no contraindications to their use, the PA will not be submitted, as it will not be approved.  Patient has to try/fail 2 preferred drugs.

## 2024-07-23 ENCOUNTER — Encounter: Payer: Self-pay | Admitting: Nurse Practitioner

## 2024-07-24 NOTE — Progress Notes (Deleted)
 58 y.o. G22P0013 Married Caucasian female here for annual exam.    PCP: Cook, Jayce G, DO   Patient's last menstrual period was 04/05/2016.           Sexually active: Yes.    The current method of family planning is post menopausal status.    Menopausal hormone therapy:  n/a Exercising: {yes no:314532}  {types:19826} Smoker:  no  OB History  Gravida Para Term Preterm AB Living  4 3 0 0 1 3  SAB IAB Ectopic Multiple Live Births  0 0 0  3    # Outcome Date GA Lbr Len/2nd Weight Sex Type Anes PTL Lv  4 AB           3 Para         LIV  2 Para         LIV  1 Para         LIV     HEALTH MAINTENANCE: Last 2 paps:  11/19/23 neg, 01/04/22 neg HR HPV neg History of abnormal Pap or positive HPV:  no Mammogram:   07/09/24 Breast Density Cat B, BIRADS Cat 1 neg  Colonoscopy:  09/22/21  Bone Density:  n/a  Result  n/a   Immunization History  Administered Date(s) Administered   Influenza,inj,Quad PF,6+ Mos 08/02/2015, 11/14/2017, 10/14/2018, 08/25/2019, 08/23/2020, 10/23/2021, 08/28/2022   Influenza-Unspecified 08/13/2016, 12/19/2017   Moderna Sars-Covid-2 Vaccination 01/28/2020, 03/01/2020, 09/16/2020, 06/05/2021   PNEUMOCOCCAL CONJUGATE-20 11/07/2021   PPD Test 08/25/2019, 08/23/2020, 07/17/2022   Pneumococcal Polysaccharide-23 10/14/2018   Tdap 06/01/2015   Zoster Recombinant(Shingrix ) 10/29/2020      reports that she has never smoked. She has never used smokeless tobacco. She reports that she does not drink alcohol and does not use drugs.  Past Medical History:  Diagnosis Date   AICD (automatic cardioverter/defibrillator) present    Anxiety    Arthritis    Blood transfusion without reported diagnosis 1999   due to heavy menses   Breast cancer (HCC) 05/22/2011   03/01/11, Stage 2, s/p lumpectomy, chemo/xrt   CHF (congestive heart failure) (HCC)    Complication of anesthesia    pt woke up during procedure in colonoscopy/endoscopy   DDD (degenerative disc disease), lumbar     Depression 06/22/2011   Diverticulitis    DM (diabetes mellitus) (HCC) 06/22/2011   Genital warts    GERD (gastroesophageal reflux disease) 06/22/2011   Hx of adenomatous colonic polyps 10/2007   4mm sigmoid tubular adenoma, FH colon cancer, mother in mid-50s   Hypercholesterolemia 06/22/2011   Hypertension 06/22/2011   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   Iron deficiency anemia 03/25/2015   LBBB (left bundle branch block)    Mild CAD    Morbid obesity (HCC)    NICM (nonischemic cardiomyopathy) (HCC)    Personal history of chemotherapy    Personal history of radiation therapy    Presence of permanent cardiac pacemaker    PSVT (paroxysmal supraventricular tachycardia) (HCC)    Shortness of breath    STD (sexually transmitted disease)    HPV, 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   Syncope    Syncope    Vaginal bleeding 03/08/2014    Past Surgical History:  Procedure Laterality Date   back surg x2     BALLOON DILATION N/A 09/22/2021   Procedure: BALLOON DILATION;  Surgeon: Cindie Carlin POUR, DO;  [facility];  Service: Endoscopy;  Laterality: N/A;   BIOPSY  09/22/2021   Procedure:  BIOPSY;  Surgeon: Cindie Carlin POUR, DO;  [facility];  Service: Endoscopy;;   BIOPSY  12/23/2023   Procedure: BIOPSY;  Surgeon: Cindie Carlin POUR, DO;  [facility];  Service: Endoscopy;;   BIV ICD INSERTION CRT-D N/A 04/23/2023   Procedure: BIV ICD INSERTION CRT-D;  Surgeon: Nancey Eulas BRAVO, MD;  Location: [facility] IN[facility] CV [facility];  Service: Cardiovascular;  Laterality: N/A;   BREAST LUMPECTOMY Right 2012   CHOLECYSTECTOMY     COLONOSCOPY  11/03/07   4-mm sessile polyp removed/small internal hemorrhoids/tubular adenoma, random colon bx negative for microscopic colitis   COLONOSCOPY WITH PROPOFOL  N/A 09/17/2016   Grade 2 hemorrhoids, colonic diverticulosis, ascending colon polyp (sessile serrated adenoma), 5 year surveillance   COLONOSCOPY WITH PROPOFOL   N/A 09/22/2021   Procedure: COLONOSCOPY WITH PROPOFOL ;  Surgeon: Cindie Carlin POUR, DO;  [facility];  Service: Endoscopy;  Laterality: N/A;  7:30am   COLONOSCOPY, ESOPHAGOGASTRODUODENOSCOPY (EGD) AND ESOPHAGEAL DILATION  01/2012   mild gastritis s/p biopsy. Empiric dilation. Normal duodenum.    DILATATION & CURETTAGE/HYSTEROSCOPY WITH MYOSURE N/A 02/10/2015   Procedure: DILATATION & CURETTAGE/HYSTEROSCOPY WITH MYOSURE;  Surgeon: Bobie BRAVO Cathlyn JAYSON Nikki, MD;  Location: [facility];  Service: Gynecology;  Laterality: N/A;   DILATATION & CURETTAGE/HYSTEROSCOPY WITH MYOSURE N/A 01/24/2024   Procedure: DILATATION & CURETTAGE/HYSTEROSCOPY WITH MYOSURE;  Surgeon: Dallie Vera GAILS, MD;  Location: [facility];  Service: Gynecology;  Laterality: N/A;  [facility]   ESOPHAGOGASTRODUODENOSCOPY (EGD) WITH PROPOFOL  N/A 01/11/2020   Normal esophagus, s/p dilation, normal stomach, normal duodenum.    ESOPHAGOGASTRODUODENOSCOPY (EGD) WITH PROPOFOL  N/A 09/22/2021   Procedure: ESOPHAGOGASTRODUODENOSCOPY (EGD) WITH PROPOFOL ;  Surgeon: Cindie Carlin POUR, DO;  [facility];  Service: Endoscopy;  Laterality: N/A;   ESOPHAGOGASTRODUODENOSCOPY (EGD) WITH PROPOFOL  N/A 12/23/2023   Procedure: ESOPHAGOGASTRODUODENOSCOPY (EGD) WITH PROPOFOL ;  Surgeon: Cindie Carlin POUR, DO;  [facility];  Service: Endoscopy;  Laterality: N/A;  11:15 am, asa 3, pt knows to arrive at 6:15   MALONEY DILATION N/A 01/11/2020   Procedure: AGAPITO DILATION;  Surgeon: Shaaron Lamar HERO, MD;  [facility];  Service: Endoscopy;  Laterality: N/A;   MM BREAST STEREO BX*L*R/S     rt.   POLYPECTOMY  09/17/2016   Procedure: POLYPECTOMY;  Surgeon: Lamar HERO Shaaron, MD;  [facility];  Service: Endoscopy;;  ascending colon   POLYPECTOMY  09/22/2021   Procedure: POLYPECTOMY;  Surgeon: Cindie Carlin POUR, DO;  [facility];  Service: Endoscopy;;   POLYPECTOMY  12/23/2023   Procedure: POLYPECTOMY;  Surgeon: Cindie Carlin POUR, DO;  [facility];  Service: Endoscopy;;   PORT-A-CATH REMOVAL  05/26/2012   Procedure: REMOVAL PORT-A-CATH;  Surgeon: Oneil DELENA Budge, MD;  Location: [facility];  Service: General;  Laterality: N/A;  Minor Room   PORTACATH PLACEMENT      Current Outpatient Medications  Medication Sig Dispense Refill   atorvastatin  (LIPITOR) 40 MG tablet Take 1 tablet (40 mg total) by mouth at bedtime. 90 tablet 3   blood glucose meter kit and supplies Dispense based on patient and insurance preference. Use up to four times daily as directed. (FOR ICD-10 E10.9, E11.9). 1 each 5   Blood Glucose Monitoring Suppl DEVI 1 each by Does not apply route 2 (two) times daily. May substitute to any manufacturer covered by patient's insurance. 1 each 0   Blood Pressure KIT 1 Units by Does not apply route daily. 1 kit 0  cetirizine  (ZYRTEC ) 10 MG tablet Take 1 tablet (10 mg total) by mouth daily. 60 tablet 2   citalopram  (CELEXA ) 40 MG tablet Take 1 tablet by mouth once daily 90 tablet 0   clindamycin  (CLEOCIN ) 2 % vaginal cream Place 1 Applicatorful vaginally at bedtime. For 7 days. 40 g 0   dexlansoprazole  (DEXILANT ) 60 MG capsule Take 1 capsule (60 mg total) by mouth daily. 90 capsule 3   Dulaglutide  (TRULICITY ) 0.75 MG/0.5ML SOAJ INJECT 0.75 MG INTO THE SKIN ONCE WEEKLY 4 mL 3   DULoxetine  (CYMBALTA ) 30 MG capsule Take 1 capsule (30 mg total) by mouth daily. 90 capsule 1   empagliflozin  (JARDIANCE ) 10 MG TABS tablet TAKE 1 TABLET BY MOUTH ONCE DAILY BEFORE BREAKFAST 90 tablet 3   fluconazole  (DIFLUCAN ) 150 MG tablet One po qd prn yeast infection; may repeat in 3-4 days if needed 2 tablet 0   fluticasone  (FLONASE ) 50 MCG/ACT nasal spray USE 2 SPRAY(S) IN EACH NOSTRIL ONCE DAILY AS NEEDED 48 g 0   HYDROcodone -acetaminophen  (NORCO) 5-325 MG tablet Take 1 tablet by mouth every 6 (six) hours as needed for moderate pain (pain score 4-6). Do Not fill Before 05/04/2024 120 tablet 0   metFORMIN  (GLUCOPHAGE -XR) 750 MG 24  hr tablet Take 1 tablet (750 mg total) by mouth daily with breakfast. 90 tablet 3   metoprolol  succinate (TOPROL -XL) 50 MG 24 hr tablet Take 1 tablet by mouth once daily 90 tablet 3   ondansetron  (ZOFRAN -ODT) 8 MG disintegrating tablet DISSOLVE 1 TABLET IN MOUTH EVERY 8 HOURS AS NEEDED FOR NAUSEA FOR VOMITING 20 tablet 0   sacubitril -valsartan  (ENTRESTO ) 24-26 MG Take 1 tablet by mouth 2 (two) times daily. 180 tablet 2   Vitamin D , Ergocalciferol , (DRISDOL ) 1.25 MG (50000 UNIT) CAPS capsule Take 1 capsule by mouth once a week 36 capsule 0   No current facility-administered medications for this visit.    ALLERGIES: Flagyl  [metronidazole ], Lansoprazole, Pravastatin , and Spironolactone   Family History  Problem Relation Age of Onset   Colon cancer Mother        mid-50s, died of metastatic disease   Cancer Mother        liver   Coronary artery disease Mother    Diabetes type I Mother    Peripheral vascular disease Mother        Carotid disease in her 56s   Coronary artery disease Father        CAD in his 9s   Diabetes Father    Diabetes type I Father    Hypertension Father    Bipolar disorder Sister    Coronary artery disease Brother        MI in his 5s   Hypertension Brother    Hypertension Maternal Grandmother    Hyperlipidemia Maternal Grandmother    Stroke Maternal Grandmother    Hypertension Maternal Grandfather    Diabetes Maternal Grandfather    Diabetes Paternal Grandmother    Heart disease Paternal Grandfather     Review of Systems  PHYSICAL EXAM:  LMP 04/05/2016 Comment: spotting 11/2016 and 12/2016    General appearance: alert, cooperative and appears stated age Head: normocephalic, without obvious abnormality, atraumatic Neck: no adenopathy, supple, symmetrical, trachea midline and thyroid  normal to inspection and palpation Lungs: clear to auscultation bilaterally Breasts: normal appearance, no masses or tenderness, No nipple retraction or dimpling, No nipple  discharge or bleeding, No axillary adenopathy Heart: regular rate and rhythm Abdomen: soft, non-tender; no masses, no organomegaly Extremities: extremities normal, atraumatic,  no cyanosis or edema Skin: skin color, texture, turgor normal. No rashes or lesions Lymph nodes: cervical, supraclavicular, and axillary nodes normal. Neurologic: grossly normal  Pelvic: External genitalia:  no lesions              No abnormal inguinal nodes palpated.              Urethra:  normal appearing urethra with no masses, tenderness or lesions              Bartholins and Skenes: normal                 Vagina: normal appearing vagina with normal color and discharge, no lesions              Cervix: no lesions              Pap taken: {yes no:314532} Bimanual Exam:  Uterus:  normal size, contour, position, consistency, mobility, non-tender              Adnexa: no mass, fullness, tenderness              Rectal exam: {yes no:314532}.  Confirms.              Anus:  normal sphincter tone, no lesions  Chaperone was present for exam:  {BSCHAPERONE:31226::Emily F, CMA}  ASSESSMENT: Well woman visit with gynecologic exam.  PHQ-2-9: ***  ***  PLAN: Mammogram screening discussed. Self breast awareness reviewed. Pap and HRV collected:  {yes no:314532} Guidelines for Calcium , Vitamin D , regular exercise program including cardiovascular and weight bearing exercise. Medication refills:  *** {LABS (Optional):23779} Follow up:  ***    Additional counseling given.  {yes [id number]. ***  total time was spent for this patient encounter, including preparation, face-to-face counseling with the patient, coordination of care, and documentation of the encounter in addition to doing the well woman visit with gynecologic exam.

## 2024-07-26 ENCOUNTER — Ambulatory Visit
Admission: RE | Admit: 2024-07-26 | Discharge: 2024-07-26 | Disposition: A | Discharge status: Home / Self Care | Payer: Self-pay | Source: Ambulatory Visit | Attending: Nurse Practitioner | Admitting: Nurse Practitioner

## 2024-07-26 VITALS — BP 102/73 | HR 82 | Temp 98.0°F | Resp 16

## 2024-07-26 DIAGNOSIS — M5412 Radiculopathy, cervical region: Secondary | ICD-10-CM | POA: Diagnosis not present

## 2024-07-26 MED ORDER — DEXAMETHASONE SODIUM PHOSPHATE 10 MG/ML IJ SOLN
10.0000 mg | Freq: Once | INTRAMUSCULAR | Status: AC
  Administered 2024-07-26: 10 mg via INTRAMUSCULAR

## 2024-07-26 MED ORDER — TIZANIDINE HCL 4 MG PO TABS: 4.0000 mg | ORAL_TABLET | Freq: Two times a day (BID) | ORAL | 0 refills | Status: AC

## 2024-07-26 MED ORDER — PREDNISONE 20 MG PO TABS: 40.0000 mg | ORAL_TABLET | Freq: Every day | ORAL | 0 refills | Status: AC

## 2024-07-26 MED ORDER — NAPROXEN 500 MG PO TABS: 500.0000 mg | ORAL_TABLET | Freq: Two times a day (BID) | ORAL | 0 refills | Status: DC

## 2024-07-26 NOTE — ED Triage Notes (Signed)
 Pt reports dull ache in the right arm that starts in the shoulder  that started x 3 weeks, has since started to become sharp x 1.5 weeks ago, fingers are numb and tingling. Has been using same meds that treat her sciatica to help with arm pain. Is having difficulty using the right ar. Denies injury

## 2024-07-26 NOTE — ED Provider Notes (Signed)
 [facility]    CSN: [id number] Arrival date & time: 07/26/24  0841      History   Chief Complaint Chief Complaint  Patient presents with   Arm Injury    Entered by patient    HPI [name] is a 58 y.o. female.   Discussed the use of AI scribe software for clinical note transcription with the patient, who gave verbal consent to proceed.   Patient presents with right shoulder pain that started about a month ago. She report experiencing sharp pains that radiates down the arm, accompanied by numbness and tingling in the fingers. 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   The pain has progressively worsened over the past month. A week and a half ago, the pain became more severe, causing stiffness in the arm that interfered with daily activities such as personal hygiene. The patient reports difficulty wiping their privates due to arm weakness and pain. The pain and numbness extend from the neck down to the arm and fingers.  The patient has a history of back problems and sciatic nerve issues. They also have a pacemaker defibrillator, which prevents them from undergoing MRI scans. 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 They mention having two clients and working full time, but have recently stopped lifting heavy objects on the advice of their pain management specialist in [place].  She has been taking her prescribed pain medications without any relief in her symptoms. She hasn't tried anything else for her symptoms.   She is also diabetic. Last A1C was 6.4%. She currently manages her diabetes with metformin , Jardiance , and Trulicity , reports good blood sugar control.  The following portions of the patient's history were reviewed and updated as appropriate: allergies, current medications, past family  history, past medical history, past social history, past surgical history, and problem list.    Past Medical History:  Diagnosis Date   AICD (automatic cardioverter/defibrillator) present    Anxiety    Arthritis    Blood transfusion without reported diagnosis 1999   due to heavy menses   Breast cancer (HCC) 05/22/2011   03/01/11, Stage 2, s/p lumpectomy, chemo/xrt   CHF (congestive heart failure) (HCC)    Complication of anesthesia    pt woke up during procedure in colonoscopy/endoscopy   DDD (degenerative disc disease), lumbar    Depression 06/22/2011   Diverticulitis    DM (diabetes mellitus) (HCC) 06/22/2011   Genital warts    GERD (gastroesophageal reflux disease) 06/22/2011   Hx of adenomatous colonic polyps 10/2007   4mm sigmoid tubular adenoma, FH colon cancer, mother in mid-50s   Hypercholesterolemia 06/22/2011   Hypertension 06/22/2011   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   Iron deficiency anemia 03/25/2015   LBBB (left bundle branch block)    Mild CAD    Morbid obesity (HCC)    NICM (nonischemic cardiomyopathy) (HCC)    Personal history of chemotherapy    Personal history of radiation therapy    Presence of permanent cardiac pacemaker    PSVT (paroxysmal supraventricular tachycardia) (HCC)    Shortness of breath    STD (sexually transmitted disease)    HPV, 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   Syncope    Syncope    Vaginal bleeding 03/08/2014    Patient Active Problem List   Diagnosis Date Noted   Bacterial vaginosis  07/21/2024   Vaginal candidiasis 07/21/2024   Dysuria 07/21/2024   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   Vitamin D  deficiency 07/16/2024   Fatigue 03/12/2024   Postmenopausal bleeding 11/28/2023   Lumbar radiculopathy 09/07/2022   NICM (nonischemic cardiomyopathy) (HCC) 08/29/2022   CAD (coronary artery disease) 08/29/2022   Aortic atherosclerosis (HCC) 08/29/2022   Lipoma 05/16/2022   Syncope 05/02/2022    Chronic pain syndrome 05/01/2022   LBBB (left bundle branch block) 05/01/2022   Dyspepsia 08/09/2021   Constipation 03/21/2021   Diabetes mellitus without complication (HCC) 12/22/2020   Nausea 12/22/2020   Abdominal pain 12/22/2019   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   Fatty liver 09/06/2016   H/O adenomatous polyp of colon 01/24/2012   FH: colon cancer 01/24/2012   GERD (gastroesophageal reflux disease) 06/22/2011   Hyperlipidemia 06/22/2011   Essential hypertension 06/22/2011   Obesity 06/22/2011   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    Past Surgical History:  Procedure Laterality Date   back surg x2     BALLOON DILATION N/A 09/22/2021   Procedure: BALLOON DILATION;  Surgeon: Cindie Carlin POUR, DO;  [facility];  Service: Endoscopy;  Laterality: N/A;   BIOPSY  09/22/2021   Procedure: BIOPSY;  Surgeon: Cindie Carlin POUR, DO;  [facility];  Service: Endoscopy;;   BIOPSY  12/23/2023   Procedure: BIOPSY;  Surgeon: Cindie Carlin POUR, DO;  [facility];  Service: Endoscopy;;   BIV ICD INSERTION CRT-D N/A 04/23/2023   Procedure: BIV ICD INSERTION CRT-D;  Surgeon: Nancey Eulas BRAVO, MD;  Location: [facility];  Service: Cardiovascular;  Laterality: N/A;   BREAST LUMPECTOMY Right 2012   CHOLECYSTECTOMY     COLONOSCOPY  11/03/07   4-mm sessile polyp removed/small internal hemorrhoids/tubular adenoma, random colon bx negative for microscopic colitis   COLONOSCOPY WITH PROPOFOL  N/A 09/17/2016   Grade 2 hemorrhoids, colonic diverticulosis, ascending colon polyp (sessile serrated adenoma), 5 year surveillance   COLONOSCOPY WITH PROPOFOL  N/A 09/22/2021   Procedure: COLONOSCOPY WITH PROPOFOL ;  Surgeon: Cindie Carlin POUR, DO;  [facility];  Service: Endoscopy;  Laterality: N/A;  7:30am   COLONOSCOPY, ESOPHAGOGASTRODUODENOSCOPY (EGD) AND ESOPHAGEAL DILATION  01/2012    mild gastritis s/p biopsy. Empiric dilation. Normal duodenum.    DILATATION & CURETTAGE/HYSTEROSCOPY WITH MYOSURE N/A 02/10/2015   Procedure: DILATATION & CURETTAGE/HYSTEROSCOPY WITH MYOSURE;  Surgeon: Bobie BRAVO Cathlyn JAYSON Nikki, MD;  Location: [facility];  Service: Gynecology;  Laterality: N/A;   DILATATION & CURETTAGE/HYSTEROSCOPY WITH MYOSURE N/A 01/24/2024   Procedure: DILATATION & CURETTAGE/HYSTEROSCOPY WITH MYOSURE;  Surgeon: Dallie Vera GAILS, MD;  Location: [facility];  Service: Gynecology;  Laterality: N/A;  [facility]   ESOPHAGOGASTRODUODENOSCOPY (EGD) WITH PROPOFOL  N/A 01/11/2020   Normal esophagus, s/p dilation, normal stomach, normal duodenum.    ESOPHAGOGASTRODUODENOSCOPY (EGD) WITH PROPOFOL  N/A 09/22/2021   Procedure: ESOPHAGOGASTRODUODENOSCOPY (EGD) WITH PROPOFOL ;  Surgeon: Cindie Carlin POUR, DO;  [facility];  Service: Endoscopy;  Laterality: N/A;   ESOPHAGOGASTRODUODENOSCOPY (EGD) WITH PROPOFOL  N/A 12/23/2023   Procedure: ESOPHAGOGASTRODUODENOSCOPY (EGD) WITH PROPOFOL ;  Surgeon: Cindie Carlin POUR, DO;  [facility];  Service: Endoscopy;  Laterality: N/A;  11:15 am, asa 3, pt knows to arrive at 6:15   MALONEY DILATION N/A 01/11/2020   Procedure: AGAPITO DILATION;  Surgeon: Shaaron Lamar HERO, MD;  [facility];  Service: Endoscopy;  Laterality: N/A;   MM BREAST STEREO BX*L*R/S  rt.   POLYPECTOMY  09/17/2016   Procedure: POLYPECTOMY;  Surgeon: Lamar CHRISTELLA Hollingshead, MD;  [facility];  Service: Endoscopy;;  ascending colon   POLYPECTOMY  09/22/2021   Procedure: POLYPECTOMY;  Surgeon: Cindie Carlin POUR, DO;  [facility];  Service: Endoscopy;;   POLYPECTOMY  12/23/2023   Procedure: POLYPECTOMY;  Surgeon: Cindie Carlin POUR, DO;  [facility];  Service: Endoscopy;;   PORT-A-CATH REMOVAL  05/26/2012   Procedure: REMOVAL PORT-A-CATH;  Surgeon: Oneil DELENA Budge, MD;  Location: [facility];  Service: General;  Laterality: N/A;  Minor Room   PORTACATH PLACEMENT       OB History     Gravida  4   Para  3   Term  0   Preterm  0   AB  1   Living  3      SAB  0   IAB  0   Ectopic  0   Multiple      Live Births  3            Home Medications    Prior to Admission medications   Medication Sig Start Date End Date Taking? Authorizing Provider  naproxen  (NAPROSYN ) 500 MG tablet Take 1 tablet (500 mg total) by mouth 2 (two) times daily with a meal. Take with food to avoid stomach upset. Do not take any additional NSAIDs while on this. You may take tylenol  in addition to this if needed for extra pain relief. 07/26/24  Yes Chandlor Noecker, FNP  predniSONE  (DELTASONE ) 20 MG tablet Take 2 tablets (40 mg total) by mouth daily for 5 days. 07/26/24 07/31/24 Yes Iola Lukes, FNP  tiZANidine  (ZANAFLEX ) 4 MG tablet Take 1 tablet (4 mg total) by mouth every 12 (twelve) hours for 5 days. 07/26/24 07/31/24 Yes Iola Lukes, FNP  atorvastatin  (LIPITOR) 40 MG tablet Take 1 tablet (40 mg total) by mouth at bedtime. 04/07/24   Cook, Jayce G, DO  blood glucose meter kit and supplies Dispense based on patient and insurance preference. Use up to four times daily as directed. (FOR ICD-10 E10.9, E11.9). 01/05/20   Alphonsa Elsie RAMAN, MD  Blood Glucose Monitoring Suppl DEVI 1 each by Does not apply route 2 (two) times daily. May substitute to any manufacturer covered by patient's insurance. 07/18/23   Mauro Elveria BROCKS, NP  Blood Pressure KIT 1 Units by Does not apply route daily. 02/28/23   Mallipeddi, Vishnu P, MD  cetirizine  (ZYRTEC ) 10 MG tablet Take 1 tablet (10 mg total) by mouth daily. 05/13/24   Cook, Jayce G, DO  citalopram  (CELEXA ) 40 MG tablet Take 1 tablet by mouth once daily 06/12/24   Grooms, [place], PA-C  clindamycin  (CLEOCIN ) 2 % vaginal cream Place 1 Applicatorful vaginally at bedtime. For 7 days. 07/21/24   Mauro Elveria BROCKS, NP  dexlansoprazole  (DEXILANT ) 60 MG capsule Take 1 capsule (60 mg total) by mouth daily. 12/05/23   Shirlean Therisa ORN, NP   Dulaglutide  (TRULICITY ) 0.75 MG/0.5ML SOAJ INJECT 0.75 MG INTO THE SKIN ONCE WEEKLY 06/08/24   Cook, Jayce G, DO  DULoxetine  (CYMBALTA ) 30 MG capsule Take 1 capsule (30 mg total) by mouth daily. 04/03/24   Lovorn, Megan, MD  empagliflozin  (JARDIANCE ) 10 MG TABS tablet TAKE 1 TABLET BY MOUTH ONCE DAILY BEFORE BREAKFAST 04/10/24   Mallipeddi, Vishnu P, MD  fluconazole  (DIFLUCAN ) 150 MG tablet One po qd prn yeast infection; may repeat in 3-4 days if needed 07/21/24   Mauro Elveria  C, NP  fluticasone  (FLONASE ) 50 MCG/ACT nasal spray USE 2 SPRAY(S) IN EACH NOSTRIL ONCE DAILY AS NEEDED 07/01/24   Cook, Jayce G, DO  HYDROcodone -acetaminophen  (NORCO) 5-325 MG tablet Take 1 tablet by mouth every 6 (six) hours as needed for moderate pain (pain score 4-6). Do Not fill Before 05/04/2024 06/30/24   Lovorn, Megan, MD  metFORMIN  (GLUCOPHAGE -XR) 750 MG 24 hr tablet Take 1 tablet (750 mg total) by mouth daily with breakfast. 04/07/24   Cook, Jayce G, DO  metoprolol  succinate (TOPROL -XL) 50 MG 24 hr tablet Take 1 tablet by mouth once daily 12/02/23   Mallipeddi, Vishnu P, MD  ondansetron  (ZOFRAN -ODT) 8 MG disintegrating tablet DISSOLVE 1 TABLET IN MOUTH EVERY 8 HOURS AS NEEDED FOR NAUSEA FOR VOMITING 07/16/24   Cook, Jayce G, DO  sacubitril -valsartan  (ENTRESTO ) 24-26 MG Take 1 tablet by mouth 2 (two) times daily. 05/25/24   Mallipeddi, Vishnu P, MD  Vitamin D , Ergocalciferol , (DRISDOL ) 1.25 MG (50000 UNIT) CAPS capsule Take 1 capsule by mouth once a week 04/08/24   Rogers Hai, MD    Family History Family History  Problem Relation Age of Onset   Colon cancer Mother        mid-50s, died of metastatic disease   Cancer Mother        liver   Coronary artery disease Mother    Diabetes type I Mother    Peripheral vascular disease Mother        Carotid disease in her 34s   Coronary artery disease Father        CAD in his 26s   Diabetes Father    Diabetes type I Father    Hypertension Father    Bipolar disorder  Sister    Coronary artery disease Brother        MI in his 37s   Hypertension Brother    Hypertension Maternal Grandmother    Hyperlipidemia Maternal Grandmother    Stroke Maternal Grandmother    Hypertension Maternal Grandfather    Diabetes Maternal Grandfather    Diabetes Paternal Grandmother    Heart disease Paternal Grandfather     Social History Social History   Tobacco Use   Smoking status: Never   Smokeless tobacco: Never  Vaping Use   Vaping status: Never Used  Substance Use Topics   Alcohol use: No    Alcohol/week: 0.0 standard drinks of alcohol   Drug use: No     Allergies   Flagyl  [metronidazole ], Lansoprazole, Pravastatin , and Spironolactone    Review of Systems Review of Systems  Musculoskeletal:  Positive for arthralgias and neck pain. Negative for joint swelling and neck stiffness.  Neurological:  Positive for weakness and numbness.  All other systems reviewed and are negative.    Physical Exam Triage Vital Signs ED Triage Vitals  Encounter Vitals Group     BP 07/26/24 0850 102/73     Girls Systolic BP Percentile --      Girls Diastolic BP Percentile --      Boys Systolic BP Percentile --      Boys Diastolic BP Percentile --      Pulse Rate 07/26/24 0850 82     Resp 07/26/24 0850 16     Temp 07/26/24 0850 98 F (36.7 C)     Temp Source 07/26/24 0850 Oral     SpO2 07/26/24 0850 94 %     Weight --      Height --      Head Circumference --  Peak Flow --      Pain Score 07/26/24 0857 8     Pain Loc --      Pain Education --      Exclude from Growth Chart --    No data found.  Updated Vital Signs BP 102/73 (BP Location: Left Arm)   Pulse 82   Temp 98 F (36.7 C) (Oral)   Resp 16   LMP 04/05/2016 Comment: spotting 11/2016 and 12/2016  SpO2 94%   Visual Acuity Right Eye Distance:   Left Eye Distance:   Bilateral Distance:    Right Eye Near:   Left Eye Near:    Bilateral Near:     Physical Exam Vitals reviewed.   Constitutional:      General: She is awake. She is not in acute distress.    Appearance: Normal appearance. She is well-developed. She is not ill-appearing, toxic-appearing or diaphoretic.  HENT:     Head: Normocephalic.     Right Ear: Hearing normal.     Left Ear: Hearing normal.     Nose: Nose normal.     Mouth/Throat:     Mouth: Mucous membranes are moist.  Eyes:     General: Vision grossly intact.     Conjunctiva/sclera: Conjunctivae normal.  Neck:   Cardiovascular:     Rate and Rhythm: Normal rate and regular rhythm.     Heart sounds: Normal heart sounds.  Pulmonary:     Effort: Pulmonary effort is normal.     Breath sounds: Normal breath sounds and air entry.  Musculoskeletal:        General: Normal range of motion.     Cervical back: Normal range of motion and neck supple. No edema, erythema, rigidity, torticollis or crepitus. Pain with movement and muscular tenderness present. No spinous process tenderness. Normal range of motion.     Comments: Limited range of motion of the right upper extremity due to pain originating in the right side of the neck. No visible deformity of the shoulder. Strength and sensation intact throughout the right upper extremity, with equal grip strength bilaterally.  Lymphadenopathy:     Cervical: No cervical adenopathy.  Skin:    General: Skin is warm and dry.  Neurological:     General: No focal deficit present.     Mental Status: She is alert and oriented to person, place, and time.     Sensory: Sensation is intact. No sensory deficit.     Motor: Motor function is intact.     Coordination: Coordination is intact.  Psychiatric:        Speech: Speech normal.        Behavior: Behavior is cooperative.      UC Treatments / Results  Labs (all labs ordered are listed, but only abnormal results are displayed) Labs Reviewed - No data to display  EKG   Radiology No results found.  Procedures Procedures (including critical care  time)  Medications Ordered in UC Medications  dexamethasone  (DECADRON ) injection 10 mg (10 mg Intramuscular Given 07/26/24 0923)    Initial Impression / Assessment and Plan / UC Course  I have reviewed the triage vital signs and the nursing notes.  Pertinent labs & imaging results that were available during my care of the patient were reviewed by me and considered in my medical decision making (see chart for details).     The patient presents with a one-month history of right-sided cervical radiculopathy characterized by radiating pain down the arm, numbness,  and tingling in the fingers. Symptoms have worsened over the past week and a half, now interfering with daily activities and causing stiffness. A CT scan from July revealed mild spinal stenosis throughout the cervical spine, with moderate stenosis at C6-C7 and mild stenosis at C3-C5, findings consistent with nerve compression and the patient's reported symptoms. His history of back problems and prior sciatic nerve issues may contribute to overall discomfort. In clinic, a dexamethasone  injection was administered, and he was prescribed a short course of oral prednisone , naproxen  twice daily, and Zanaflex  for muscle tightness. Norco may be used as needed for severe pain. Supportive measures including alternating ice and heat, gentle massage, rest, use of a supportive pillow, and avoidance of heavy lifting were discussed. He was advised to monitor blood sugar closely while on steroids. Patient is instructed to follow up with pain management as scheduled on September 19th, and to contact his PCP sooner or seek emergency evaluation if he develops worsening weakness, loss of bowel or bladder control, or other concerning symptoms.  Today's evaluation has revealed no signs of a dangerous process. Discussed diagnosis with patient and/or guardian. Patient and/or guardian aware of their diagnosis, possible red flag symptoms to watch out for and need for close  follow up. Patient and/or guardian understands verbal and written discharge instructions. Patient and/or guardian comfortable with plan and disposition.  Patient and/or guardian has a clear mental status at this time, good insight into illness (after discussion and teaching) and has clear judgment to make decisions regarding their care  Documentation was completed with the aid of voice recognition software. Transcription may contain typographical errors. Final Clinical Impressions(s) / UC Diagnoses   Final diagnoses:  Right cervical radiculopathy     Discharge Instructions      You were seen today for neck and arm pain related to cervical radiculopathy, which means that nerves in your neck are being pinched by narrowing in your spine. This can cause pain that radiates down the arm, numbness, tingling, and stiffness. Your prior CT scan showed spinal stenosis which is likely causing your symptoms.  In clinic, you received a steroid injection to help reduce inflammation. You have also been prescribed a short course of oral prednisone , naproxen  twice daily for pain and Zanaflex  for muscle tightness,. You may continue to use your Norco for more severe pain. While on steroids, it is important to monitor your blood sugar closely since you have a history of diabetes.  At home, you may use both ice and heat on your neck to relieve discomfort. Apply ice or a cold pack wrapped in a towel for 20 minutes at a time. Heat, such as a warm compress or heating pad, can be used for 20 minutes to relax tight muscles. You may alternate these therapies two to three times a day. Gentle massage of the neck and shoulders may also help reduce stiffness. Rest as needed, avoid heavy lifting, and use a supportive pillow when sleeping to help keep your neck aligned.  Please follow up with pain management as scheduled on September 19th. If your symptoms worsen before then, or if you develop new or concerning signs such as  increasing weakness in the arms or legs, sudden loss of coordination, or loss of bowel or bladder control, you should go to the emergency department immediately. For persistent pain or new issues that are not urgent, please contact your primary care provider or pain management team.     ED Prescriptions     Medication  Sig Dispense Auth. Provider   naproxen  (NAPROSYN ) 500 MG tablet Take 1 tablet (500 mg total) by mouth 2 (two) times daily with a meal. Take with food to avoid stomach upset. Do not take any additional NSAIDs while on this. You may take tylenol  in addition to this if needed for extra pain relief. 20 tablet Iola Lukes, FNP   predniSONE  (DELTASONE ) 20 MG tablet Take 2 tablets (40 mg total) by mouth daily for 5 days. 10 tablet Iola Lukes, FNP   tiZANidine  (ZANAFLEX ) 4 MG tablet Take 1 tablet (4 mg total) by mouth every 12 (twelve) hours for 5 days. 10 tablet Iola Lukes, FNP      I have reviewed the PDMP during this encounter.   Iola [place], [place] 07/26/24 [phone]

## 2024-07-26 NOTE — Discharge Instructions (Addendum)
 You were seen today for neck and arm pain related to cervical radiculopathy, which means that nerves in your neck are being pinched by narrowing in your spine. This can cause pain that radiates down the arm, numbness, tingling, and stiffness. Your prior CT scan showed spinal stenosis which is likely causing your symptoms.  In clinic, you received a steroid injection to help reduce inflammation. You have also been prescribed a short course of oral prednisone , naproxen  twice daily for pain and Zanaflex  for muscle tightness,. You may continue to use your Norco for more severe pain. While on steroids, it is important to monitor your blood sugar closely since you have a history of diabetes.  At home, you may use both ice and heat on your neck to relieve discomfort. Apply ice or a cold pack wrapped in a towel for 20 minutes at a time. Heat, such as a warm compress or heating pad, can be used for 20 minutes to relax tight muscles. You may alternate these therapies two to three times a day. Gentle massage of the neck and shoulders may also help reduce stiffness. Rest as needed, avoid heavy lifting, and use a supportive pillow when sleeping to help keep your neck aligned.  Please follow up with pain management as scheduled on September 19th. If your symptoms worsen before then, or if you develop new or concerning signs such as increasing weakness in the arms or legs, sudden loss of coordination, or loss of bowel or bladder control, you should go to the emergency department immediately. For persistent pain or new issues that are not urgent, please contact your primary care provider or pain management team.

## 2024-07-27 ENCOUNTER — Telehealth: Payer: Self-pay

## 2024-07-27 ENCOUNTER — Other Ambulatory Visit: Payer: Self-pay | Admitting: Family Medicine

## 2024-07-27 ENCOUNTER — Telehealth: Payer: Self-pay | Admitting: *Deleted

## 2024-07-27 LAB — CUP PACEART REMOTE DEVICE CHECK
Battery Remaining Longevity: 120 mo
Battery Voltage: 3.01 V
Brady Statistic RV Percent Paced: 6.26 %
Date Time Interrogation Session: 20250902211942
HighPow Impedance: 69 Ohm
Implantable Lead Connection Status: 753985
Implantable Lead Connection Status: 753985
Implantable Lead Connection Status: 753985
Implantable Lead Implant Date: 20240604
Implantable Lead Implant Date: 20240604
Implantable Lead Implant Date: 20240604
Implantable Lead Location: 753858
Implantable Lead Location: 753859
Implantable Lead Location: 753860
Implantable Lead Model: 4598
Implantable Lead Model: 5076
Implantable Pulse Generator Implant Date: 20240604
Lead Channel Impedance Value: 1102 Ohm
Lead Channel Impedance Value: 1273 Ohm
Lead Channel Impedance Value: 1273 Ohm
Lead Channel Impedance Value: 399 Ohm
Lead Channel Impedance Value: 475 Ohm
Lead Channel Impedance Value: 608 Ohm
Lead Channel Impedance Value: 627 Ohm
Lead Channel Impedance Value: 646 Ohm
Lead Channel Impedance Value: 703 Ohm
Lead Channel Impedance Value: 741 Ohm
Lead Channel Impedance Value: 798 Ohm
Lead Channel Impedance Value: 931 Ohm
Lead Channel Impedance Value: 950 Ohm
Lead Channel Pacing Threshold Amplitude: 0.625 V
Lead Channel Pacing Threshold Amplitude: 0.75 V
Lead Channel Pacing Threshold Amplitude: 1.125 V
Lead Channel Pacing Threshold Pulse Width: 0.4 ms
Lead Channel Pacing Threshold Pulse Width: 0.4 ms
Lead Channel Pacing Threshold Pulse Width: 0.4 ms
Lead Channel Sensing Intrinsic Amplitude: 1.9 mV
Lead Channel Sensing Intrinsic Amplitude: 8.8 mV
Lead Channel Setting Pacing Amplitude: 1.25 V
Lead Channel Setting Pacing Amplitude: 1.75 V
Lead Channel Setting Pacing Amplitude: 2 V
Lead Channel Setting Pacing Pulse Width: 0.4 ms
Lead Channel Setting Pacing Pulse Width: 0.4 ms
Lead Channel Setting Sensing Sensitivity: 0.3 mV
Zone Setting Status: 755011
Zone Setting Status: 755011
Zone Setting Status: 755011

## 2024-07-27 MED ORDER — HYDROCODONE-ACETAMINOPHEN 5-325 MG PO TABS: 1.0000 | ORAL_TABLET | Freq: Four times a day (QID) | ORAL | 0 refills | Status: DC | PRN

## 2024-07-27 NOTE — Telephone Encounter (Signed)
 PA request has been Received. New Encounter has been or will be created for follow up. For additional info see Pharmacy telephone encounter from 07/22/2024.  Please see 07/22/2024 encounter for insurance preferred covered medications.

## 2024-07-27 NOTE — Addendum Note (Signed)
 Addended by: Niall Illes on: 07/27/2024 04:24 PM   Modules accepted: Orders

## 2024-07-27 NOTE — Telephone Encounter (Signed)
 Patient is calling to folow up on prior authorization

## 2024-07-27 NOTE — Telephone Encounter (Signed)
 Requesting a refill for her hydrocodone .

## 2024-07-27 NOTE — Telephone Encounter (Signed)
 07/22/24 10:12 AM Note Pharmacy Patient Advocate Encounter   Received notification from CoverMyMeds that prior authorization for Clindamycin  Phosphate 2% cream is required/requested.   Insurance verification completed.   The patient is insured through [facility] MEDICAID .   Per test claim:  see below is preferred by the insurance.  If suggested medication is appropriate, Please send in a new RX and discontinue this one. If not, please advise as to why it's not appropriate so that we may request a Prior Authorization. Please note, some preferred medications may still require a PA.  If the suggested medications have not been trialed and there are no contraindications to their use, the PA will not be submitted, as it will not be approved.   Patient has to try/fail 2 preferred drugs.

## 2024-07-28 ENCOUNTER — Other Ambulatory Visit: Payer: Self-pay | Admitting: Nurse Practitioner

## 2024-07-28 ENCOUNTER — Ambulatory Visit: Admitting: Obstetrics and Gynecology

## 2024-07-28 LAB — CUP PACEART REMOTE DEVICE CHECK
Battery Remaining Longevity: 120 mo
Battery Voltage: 3.01 V
Brady Statistic RV Percent Paced: 6.26 %
Date Time Interrogation Session: 20250902211942
HighPow Impedance: 69 Ohm
Implantable Lead Connection Status: 753985
Implantable Lead Connection Status: 753985
Implantable Lead Connection Status: 753985
Implantable Lead Implant Date: 20240604
Implantable Lead Implant Date: 20240604
Implantable Lead Implant Date: 20240604
Implantable Lead Location: 753858
Implantable Lead Location: 753859
Implantable Lead Location: 753860
Implantable Lead Model: 4598
Implantable Lead Model: 5076
Implantable Pulse Generator Implant Date: 20240604
Lead Channel Impedance Value: 1102 Ohm
Lead Channel Impedance Value: 1273 Ohm
Lead Channel Impedance Value: 1273 Ohm
Lead Channel Impedance Value: 399 Ohm
Lead Channel Impedance Value: 475 Ohm
Lead Channel Impedance Value: 608 Ohm
Lead Channel Impedance Value: 627 Ohm
Lead Channel Impedance Value: 646 Ohm
Lead Channel Impedance Value: 703 Ohm
Lead Channel Impedance Value: 741 Ohm
Lead Channel Impedance Value: 798 Ohm
Lead Channel Impedance Value: 931 Ohm
Lead Channel Impedance Value: 950 Ohm
Lead Channel Pacing Threshold Amplitude: 0.625 V
Lead Channel Pacing Threshold Amplitude: 0.75 V
Lead Channel Pacing Threshold Amplitude: 1.125 V
Lead Channel Pacing Threshold Pulse Width: 0.4 ms
Lead Channel Pacing Threshold Pulse Width: 0.4 ms
Lead Channel Pacing Threshold Pulse Width: 0.4 ms
Lead Channel Sensing Intrinsic Amplitude: 1.9 mV
Lead Channel Sensing Intrinsic Amplitude: 8.8 mV
Lead Channel Setting Pacing Amplitude: 1.25 V
Lead Channel Setting Pacing Amplitude: 1.75 V
Lead Channel Setting Pacing Amplitude: 2 V
Lead Channel Setting Pacing Pulse Width: 0.4 ms
Lead Channel Setting Pacing Pulse Width: 0.4 ms
Lead Channel Setting Sensing Sensitivity: 0.3 mV
Zone Setting Status: 755011
Zone Setting Status: 755011
Zone Setting Status: 755011

## 2024-07-28 MED ORDER — CLEOCIN 100 MG VA SUPP: 100.0000 mg | Freq: Every day | VAGINAL | 0 refills | Status: AC

## 2024-07-29 ENCOUNTER — Other Ambulatory Visit: Payer: Self-pay | Admitting: Family Medicine

## 2024-07-29 ENCOUNTER — Telehealth: Payer: Self-pay | Admitting: *Deleted

## 2024-07-29 MED ORDER — ONDANSETRON 8 MG PO TBDP: ORAL_TABLET | ORAL | 3 refills | Status: DC

## 2024-07-29 NOTE — Telephone Encounter (Signed)
Therapy changed.

## 2024-07-29 NOTE — Telephone Encounter (Unsigned)
 Copied from CRM #[id number]. Topic: Clinical - Prescription Issue >> Jul 29, 2024 10:57 AM Emylou G wrote: Reason for CRM: Patient called trying to refill Refused Ondansetron  8 MG DISSOLVE 1 TABLET IN MOUTH EVERY 8 HOURS AS NEEDED FOR NAUSEA FOR VOMITING.. showing denied?  Pls review for a call back or refill

## 2024-07-30 NOTE — Telephone Encounter (Signed)
 Cook, Jayce G, DO      07/29/24 10:08 PM Rx refilled.

## 2024-07-31 DIAGNOSIS — Z419 Encounter for procedure for purposes other than remedying health state, unspecified: Secondary | ICD-10-CM | POA: Diagnosis not present

## 2024-08-01 ENCOUNTER — Emergency Department (HOSPITAL_COMMUNITY)

## 2024-08-01 ENCOUNTER — Emergency Department (HOSPITAL_COMMUNITY)
Admission: EM | Admit: 2024-08-01 | Discharge: 2024-08-01 | Disposition: A | Discharge status: Home / Self Care | Attending: Emergency Medicine | Admitting: Emergency Medicine

## 2024-08-01 ENCOUNTER — Other Ambulatory Visit: Payer: Self-pay

## 2024-08-01 DIAGNOSIS — Y92512 Supermarket, store or market as the place of occurrence of the external cause: Secondary | ICD-10-CM | POA: Insufficient documentation

## 2024-08-01 DIAGNOSIS — M25561 Pain in right knee: Secondary | ICD-10-CM | POA: Diagnosis not present

## 2024-08-01 DIAGNOSIS — X509XXA Other and unspecified overexertion or strenuous movements or postures, initial encounter: Secondary | ICD-10-CM | POA: Diagnosis not present

## 2024-08-01 DIAGNOSIS — Z043 Encounter for examination and observation following other accident: Secondary | ICD-10-CM | POA: Diagnosis not present

## 2024-08-01 MED ORDER — LIDOCAINE 5 % EX PTCH
1.0000 | MEDICATED_PATCH | CUTANEOUS | Status: DC
  Administered 2024-08-01: 1 via TRANSDERMAL
  Filled 2024-08-01: qty 1

## 2024-08-01 MED ORDER — LIDOCAINE 5 % EX PTCH: 1.0000 | MEDICATED_PATCH | CUTANEOUS | 0 refills | Status: DC

## 2024-08-01 NOTE — ED Provider Notes (Signed)
 [place] [facility]ERGENC[facility] DEPARTMENT AT [facility] Provider Note   CSN: [id number] Arrival date & time: 08/01/24  1234     Patient presents with: Leg Injury   [name] is a 58 y.o. female presenting for evaluation of right knee pain which started today.  She describes simply walking in a grocery store when she had sudden onset of pain in the right knee that caused her to a near collapse as her knee felt like it was going to buckle but did not.  She has had persistent pain at the lateral knee joint space which radiates into her mid thigh.  Pain is worse with attempts at weightbearing and is better at rest.  She has had no prior injuries to this knee, she has seen Dr. Brenna in the past for some issues with arthritis type pain, but not recently.  She has had no treatment for symptoms prior to arrival.  She walks with a cane at baseline secondary to chronic low back pain and is under the care of pain management.  She takes hydrocodone  daily and also naproxen .  Denies knee swelling, no numbness in her lower leg or foot.   The history is provided by the patient.       Prior to Admission medications   Medication Sig Start Date End Date Taking? Authorizing Provider  lidocaine  (LIDODERM ) 5 % Place 1 patch onto the skin daily. Remove & Discard patch within 12 hours or as directed by MD 08/01/24  Yes Ival Basquez, Mliss, PA-C  atorvastatin  (LIPITOR) 40 MG tablet Take 1 tablet (40 mg total) by mouth at bedtime. 04/07/24   Cook, Jayce G, DO  blood glucose meter kit and supplies Dispense based on patient and insurance preference. Use up to four times daily as directed. (FOR ICD-10 E10.9, E11.9). 01/05/20   Alphonsa Elsie RAMAN, MD  Blood Glucose Monitoring Suppl DEVI 1 each by Does not apply route 2 (two) times daily. May substitute to any manufacturer covered by patient's insurance. 07/18/23   Mauro Elveria BROCKS, NP  Blood Pressure KIT 1 Units by Does not apply route daily. 02/28/23   Mallipeddi, Vishnu P,  MD  cetirizine  (ZYRTEC ) 10 MG tablet Take 1 tablet (10 mg total) by mouth daily. 05/13/24   Cook, Jayce G, DO  citalopram  (CELEXA ) 40 MG tablet Take 1 tablet by mouth once daily 06/12/24   Grooms, Jena, PA-C  clindamycin  (CLEOCIN ) 100 MG vaginal suppository Place 1 suppository (100 mg total) vaginally at bedtime. 07/28/24   Mauro Elveria BROCKS, NP  dexlansoprazole  (DEXILANT ) 60 MG capsule Take 1 capsule (60 mg total) by mouth daily. 12/05/23   Shirlean Therisa ORN, NP  Dulaglutide  (TRULICITY ) 0.75 MG/0.5ML SOAJ INJECT 0.75 MG INTO THE SKIN ONCE WEEKLY 06/08/24   Cook, Jayce G, DO  DULoxetine  (CYMBALTA ) 30 MG capsule Take 1 capsule (30 mg total) by mouth daily. 04/03/24   Lovorn, Megan, MD  empagliflozin  (JARDIANCE ) 10 MG TABS tablet TAKE 1 TABLET BY MOUTH ONCE DAILY BEFORE BREAKFAST 04/10/24   Mallipeddi, Vishnu P, MD  fluconazole  (DIFLUCAN ) 150 MG tablet One po qd prn yeast infection; may repeat in 3-4 days if needed 07/21/24   Hoskins, Carolyn C, NP  fluticasone  (FLONASE ) 50 MCG/ACT nasal spray USE 2 SPRAY(S) IN EACH NOSTRIL ONCE DAILY AS NEEDED 07/01/24   Cook, Jayce G, DO  HYDROcodone -acetaminophen  (NORCO) 5-325 MG tablet Take 1 tablet by mouth every 6 (six) hours as needed for moderate pain (pain score 4-6). Do Not fill Before 05/04/2024  07/27/24   Lovorn, Megan, MD  metFORMIN  (GLUCOPHAGE -XR) 750 MG 24 hr tablet Take 1 tablet (750 mg total) by mouth daily with breakfast. 04/07/24   Cook, Jayce G, DO  metoprolol  succinate (TOPROL -XL) 50 MG 24 hr tablet Take 1 tablet by mouth once daily 12/02/23   Mallipeddi, Vishnu P, MD  naproxen  (NAPROSYN ) 500 MG tablet Take 1 tablet (500 mg total) by mouth 2 (two) times daily with a meal. Take with food to avoid stomach upset. Do not take any additional NSAIDs while on this. You may take tylenol  in addition to this if needed for extra pain relief. 07/26/24   Murrill, Samantha, FNP  ondansetron  (ZOFRAN -ODT) 8 MG disintegrating tablet DISSOLVE 1 TABLET IN MOUTH EVERY 8 HOURS AS NEEDED  FOR NAUSEA FOR VOMITING 07/29/24   Cook, Jayce G, DO  sacubitril -valsartan  (ENTRESTO ) 24-26 MG Take 1 tablet by mouth 2 (two) times daily. 05/25/24   Mallipeddi, Vishnu P, MD  Vitamin D , Ergocalciferol , (DRISDOL ) 1.25 MG (50000 UNIT) CAPS capsule Take 1 capsule by mouth once a week 04/08/24   Rogers Hai, MD    Allergies: Flagyl  [metronidazole ], Lansoprazole, Pravastatin , and Spironolactone     Review of Systems  Constitutional:  Negative for fever.  Musculoskeletal:  Positive for arthralgias. Negative for joint swelling and myalgias.  Neurological:  Negative for weakness and numbness.    Updated Vital Signs BP 125/70   Pulse 71   Temp 98.3 F (36.8 C)   Resp 16   Ht 5' 4 (1.626 m)   Wt 102.5 kg   LMP 04/05/2016 Comment: spotting 11/2016 and 12/2016  SpO2 97%   BMI 38.79 kg/m   Physical Exam Constitutional:      Appearance: She is well-developed.  HENT:     Head: Atraumatic.  Cardiovascular:     Comments: Pulses equal bilaterally Musculoskeletal:        General: Tenderness present. No swelling or deformity.     Cervical back: Normal range of motion.     Right knee: Bony tenderness and crepitus present. No swelling, deformity or erythema. No LCL laxity, MCL laxity, ACL laxity or PCL laxity. Normal pulse.     Comments: Faint crepitus to palpation of the right patella but no pain, she is point tender pain at the right lateral knee joint space.  No palpable meniscus deformity.  increased pain at the lateral space with varus strain.  No tenderness to palpation of her lower thigh musculature.  No lower leg or ankle pain or edema.  Skin:    General: Skin is warm and dry.  Neurological:     Mental Status: She is alert.     Sensory: No sensory deficit.     Motor: No weakness.     Deep Tendon Reflexes: Reflexes normal.     (all labs ordered are listed, but only abnormal results are displayed) Labs Reviewed - No data to display  EKG: None  Radiology: DG Knee 2 Views  Right Result Date: 08/01/2024 CLINICAL DATA:  Fall. EXAM: RIGHT KNEE - 1-2 VIEW COMPARISON:  None Available. FINDINGS: No acute fracture or dislocation. The bones are well mineralized. No significant arthritic changes. No joint effusion. The soft tissues are unremarkable. IMPRESSION: Negative. Electronically Signed   By: Vanetta Chou M.D.   On: 08/01/2024 15:13     Procedures   Medications Ordered in the ED  lidocaine  (LIDODERM ) 5 % 1 patch (has no administration in time range)  Medical Decision Making History exam concerning for possible meniscal injury.  She also has some discomfort with varus strain of the right knee joint, this could also represent a lateral collateral ligament injury.  She has no knee effusion, there is no erythema to suggest septic joint.  She was offered a Lidoderm  patch for additional pain relief while she is awaiting orthopedic follow-up.  She was placed in a hinged knee sleeve and also placed on a walker for more stable ambulation.  Referral to Dr. Margrette for follow-up care.  Amount and/or Complexity of Data Reviewed Radiology: ordered.    Details: Images reviewed, no acute findings.  Risk Prescription drug management.        Final diagnoses:  Acute pain of right knee    ED Discharge Orders          Ordered    lidocaine  (LIDODERM ) 5 %  Every 24 hours        08/01/24 1622               Brewer Hitchman, PA-C 08/01/24 RAYNOLD Suzette Pac, MD 08/03/24 1227

## 2024-08-01 NOTE — ED Triage Notes (Signed)
 Pt stated she was grocery shopping today and her right knee buckled under her and now she can't put pressure on her leg.

## 2024-08-01 NOTE — Discharge Instructions (Addendum)
 Your xray is negative but your exam suggests a possible cartilage injury of the joint as discussed.  Use ice and elevation as much as possible for the next several days along with your home pain medicines.  You can continue to apply a new pain patch daily if the one applied here is helpful.

## 2024-08-03 ENCOUNTER — Telehealth: Payer: Self-pay | Admitting: Physical Medicine and Rehabilitation

## 2024-08-03 NOTE — Telephone Encounter (Signed)
 PATIENT IS AWARE THAT DR. CORNELIO  IS OUT OF THE OFFICE:  Patient was recently seen in the ED & urgent care for right arm & right leg pain. She wanted to be seen sooner by Dr. Lovorn. Patient has an appointment on 08/07/2024.   There are no sooner appointments. She has been advise to call her PCP, if needed. Dr. CORNELIO will informed.

## 2024-08-03 NOTE — Progress Notes (Signed)
Remote ICD Transmission.

## 2024-08-03 NOTE — Telephone Encounter (Signed)
 Patient called requesting to speak with you before her appointment on Friday. She reports difficulty walking and is scheduled to return to work that same day. She is seeking guidance on how to proceed.

## 2024-08-04 ENCOUNTER — Telehealth: Payer: Self-pay | Admitting: Orthopedic Surgery

## 2024-08-04 ENCOUNTER — Ambulatory Visit

## 2024-08-04 ENCOUNTER — Inpatient Hospital Stay: Attending: Oncology | Admitting: Oncology

## 2024-08-04 VITALS — BP 126/51 | HR 78 | Temp 98.4°F | Resp 16 | Wt 228.2 lb

## 2024-08-04 DIAGNOSIS — E559 Vitamin D deficiency, unspecified: Secondary | ICD-10-CM | POA: Diagnosis not present

## 2024-08-04 DIAGNOSIS — Z86000 Personal history of in-situ neoplasm of breast: Secondary | ICD-10-CM | POA: Insufficient documentation

## 2024-08-04 DIAGNOSIS — Z9221 Personal history of antineoplastic chemotherapy: Secondary | ICD-10-CM | POA: Diagnosis not present

## 2024-08-04 DIAGNOSIS — Z923 Personal history of irradiation: Secondary | ICD-10-CM | POA: Diagnosis not present

## 2024-08-04 DIAGNOSIS — M79604 Pain in right leg: Secondary | ICD-10-CM | POA: Insufficient documentation

## 2024-08-04 DIAGNOSIS — G620 Drug-induced polyneuropathy: Secondary | ICD-10-CM | POA: Diagnosis not present

## 2024-08-04 DIAGNOSIS — T451X5A Adverse effect of antineoplastic and immunosuppressive drugs, initial encounter: Secondary | ICD-10-CM

## 2024-08-04 DIAGNOSIS — C50911 Malignant neoplasm of unspecified site of right female breast: Secondary | ICD-10-CM | POA: Diagnosis not present

## 2024-08-04 NOTE — Telephone Encounter (Signed)
 Spoke w/the pt, she went to the ED 07/29/24 for her rt knee, she wanted to be seen this week.  She stated she'll f/u w/her doctor on Friday, she already has an appointment.

## 2024-08-04 NOTE — Progress Notes (Signed)
 [facility] OFFICE PROGRESS NOTE  Cook, Jayce G, DO  ASSESSMENT & PLAN:    Assessment & Plan 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HCC) Mammogram from 07/09/2024 was read as BI-RADS Category 1 negative.  Recommend follow-up in 12 months.  Labs from 06/13/2023 show a normal CBC.  Chemotherapy-induced neuropathy (HCC) Numbness in the extremities is stable.  No indication for gabapentin .  Vitamin D  deficiency  Vitamin D  is 95.72. Currently on ergocalciferol  50000 units weekly.  Recommend taking every other week.  Will recheck vitamin D  level at next clinic visit. Right leg pain Referral to orthopedics. Evaluated in ED for same concern and given Solu-Medrol  shot which was helpful. CT scans from 06/16/2024 showed trace anterolisthesis without acute fracture or suspicious lesions.  Several areas of disc bulging and spinal stenosis. She is followed by pain management in [place] and has an appointment on Friday but is not sure she can wait that long. Reports she has trouble bearing weight on her right leg.  She needs support using a cane or crutch.  No loss of bowel or bladder.  Orders Placed This Encounter  Procedures   MM 3D SCREENING MAMMOGRAM BILATERAL BREAST    Standing Status:   Future    Expected Date:   06/26/2025    Expiration Date:   08/04/2025    Reason for Exam (SYMPTOM  OR DIAGNOSIS REQUIRED):   annual    Is the patient pregnant?:   No    Preferred imaging location?:   [facility]   AMB referral to orthopedics    Referral Priority:   Routine    Referral Type:   Consultation    Referred to Provider:   Margrette Taft BRAVO, MD    Number of Visits Requested:   1    INTERVAL HISTORY: Patient returns for follow-up.  She was evaluated in the emergency room twice this month for orthopedic issues.  She was seen for right shoulder pain as well as right hip knee and leg pain.  Reports she was given a steroid shot which did help quite a bit.  She still  continues to be in quite a bit of pain and is unable to bear weight on her right leg.  Denies any new lumps or bumps or changes to her breast.  She recently had a mammogram and is here to review results.  Reports energy levels are low.  Appetite is 50%.  We reviewed mammogram and vitamin D , CMP, CBC.  SUMMARY OF HEMATOLOGIC HISTORY: Oncology History  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HCC)  02/28/2011 Initial Diagnosis   Right needle core biopsy demonstrating Invasive ductal carcinoma of right breast   03/21/2011 Surgery   Right lumpectomy demonstrating a 3.8 cm invasive ductal carcinoma, grade III, no LVI, and 0/1 lymph nodes   05/11/2011 - 07/13/2011 Chemotherapy   AC x 4   08/03/2011 - 10/19/2011 Chemotherapy   Paclitaxel  weekly x 12   11/07/2011 - 12/31/2011 Radiation Therapy     02/10/2015 Procedure   Hysteroscopy with dilation and curettage by Dr. Bobie Cary.   02/10/2015 Pathology Results   Diagnosis Endometrium, curettage - ABUNDANT BLOOD AND DEGENERATIVE SECRETORY ENDOMETRIUM WITH STROMAL BREAKDOWN (VERY LIMITED MATERIAL). - INFLAMED SQUAMOUS EPITHELIUM AND ENDOCERVICAL MUCOSA, NO DYSPLASIA OR MALIGNANCY.      CBC    Component Value Date/Time   WBC 5.3 07/09/2024 0752   RBC 5.08 07/09/2024 0752   HGB 14.1 07/09/2024 0752   HGB 14.1 04/16/2023 1558  HGB 10.0 (L) 02/10/2015 0848   HCT 44.6 07/09/2024 0752   HCT 44.5 04/16/2023 1558   PLT 203 07/09/2024 0752   PLT 246 04/16/2023 1558   MCV 87.8 07/09/2024 0752   MCV 86 04/16/2023 1558   MCH 27.8 07/09/2024 0752   MCHC 31.6 07/09/2024 0752   RDW 16.2 (H) 07/09/2024 0752   RDW 15.9 (H) 04/16/2023 1558   LYMPHSABS 2.2 07/09/2024 0752   LYMPHSABS 2.3 04/16/2023 1558   MONOABS 0.5 07/09/2024 0752   EOSABS 0.2 07/09/2024 0752   EOSABS 0.2 04/16/2023 1558   BASOSABS 0.1 07/09/2024 0752   BASOSABS 0.0 04/16/2023 1558       Latest Ref Rng & Units 07/21/2024    4:00 PM 07/09/2024    7:52 AM 03/25/2024    4:15  PM  CMP  Glucose 70 - 99 92  119  93   BUN 6 - 20 mg/dL  7  8   Creatinine 9.55 - 1.00 mg/dL  9.22  9.02   Sodium 864 - 145 mmol/L  141  136   Potassium 3.5 - 5.1 mmol/L  3.6  3.9   Chloride 98 - 111 mmol/L  104  99   CO2 22 - 32 mmol/L  25  26   Calcium  8.9 - 10.3 mg/dL  9.0  9.5   Total Protein 6.5 - 8.1 g/dL  7.6  8.0   Total Bilirubin 0.0 - 1.2 mg/dL  0.7  0.6   Alkaline Phos 38 - 126 U/L  78  65   AST 15 - 41 U/L  21  23   ALT 0 - 44 U/L  20  22      Lab Results  Component Value Date   FERRITIN 29 10/23/2023    Vitals:   08/04/24 1404  BP: (!) 126/51  Pulse: 78  Resp: 16  Temp: 98.4 F (36.9 C)  SpO2: 97%    Review of System:  Review of Systems  Constitutional:  Positive for malaise/fatigue. Negative for weight loss.  Musculoskeletal:  Positive for back pain and joint pain.  Neurological:  Positive for dizziness, tingling and headaches.  Psychiatric/Behavioral:  The patient has insomnia.     Physical Exam: Physical Exam Constitutional:      Appearance: Normal appearance.  HENT:     Head: Normocephalic and atraumatic.  Eyes:     Pupils: Pupils are equal, round, and reactive to light.  Cardiovascular:     Rate and Rhythm: Normal rate and regular rhythm.     Heart sounds: Normal heart sounds. No murmur heard. Pulmonary:     Effort: Pulmonary effort is normal.     Breath sounds: Normal breath sounds. No wheezing.  Chest:  Breasts:    Right: No swelling.     Left: No swelling.     Comments: No lymphadenopathy on exam today.  No mass or lesion. Abdominal:     General: Bowel sounds are normal. There is no distension.     Palpations: Abdomen is soft.     Tenderness: There is no abdominal tenderness.  Musculoskeletal:        General: Normal range of motion.     Cervical back: Normal range of motion.  Skin:    General: Skin is warm and dry.     Findings: No rash.  Neurological:     Mental Status: She is alert and oriented to person, place, and time.      Gait: Gait is intact.  Psychiatric:  Mood and Affect: Mood and affect normal.        Cognition and Memory: Memory normal.        Judgment: Judgment normal.      I spent 20 minutes dedicated to the care of this patient (face-to-face and non-face-to-face) on the date of the encounter to include what is described in the assessment and plan.,  Delon Hope, NP 08/04/2024 2:48 PM

## 2024-08-04 NOTE — Assessment & Plan Note (Addendum)
-   Numbness in the extremities is stable.  No indication for gabapentin .

## 2024-08-04 NOTE — Assessment & Plan Note (Addendum)
 Vitamin D  is 95.72. Currently on ergocalciferol  50000 units weekly.  Recommend taking every other week.  Will recheck vitamin D  level at next clinic visit.

## 2024-08-04 NOTE — Assessment & Plan Note (Addendum)
-   Mammogram from 07/09/2024 was read as BI-RADS Category 1 negative.  Recommend follow-up in 12 months. - Labs from 06/13/2023 show a normal CBC.

## 2024-08-05 ENCOUNTER — Ambulatory Visit: Payer: Self-pay | Admitting: Cardiovascular Disease

## 2024-08-06 ENCOUNTER — Ambulatory Visit

## 2024-08-07 ENCOUNTER — Ambulatory Visit (INDEPENDENT_AMBULATORY_CARE_PROVIDER_SITE_OTHER): Admitting: Student

## 2024-08-07 ENCOUNTER — Encounter: Admitting: Physical Medicine and Rehabilitation

## 2024-08-07 ENCOUNTER — Other Ambulatory Visit (HOSPITAL_BASED_OUTPATIENT_CLINIC_OR_DEPARTMENT_OTHER): Payer: Self-pay

## 2024-08-07 ENCOUNTER — Ambulatory Visit: Payer: Self-pay | Admitting: *Deleted

## 2024-08-07 DIAGNOSIS — G8929 Other chronic pain: Secondary | ICD-10-CM

## 2024-08-07 DIAGNOSIS — M5441 Lumbago with sciatica, right side: Secondary | ICD-10-CM

## 2024-08-07 MED ORDER — METHYLPREDNISOLONE 4 MG PO TBPK
ORAL_TABLET | ORAL | 0 refills | Status: DC
  Filled 2024-08-07: qty 21, 6d supply, fill #0

## 2024-08-07 NOTE — Telephone Encounter (Signed)
 Patient stated she went to ortho since she did not here from us - they rescheduled the physical rehab appt to November 7th and put her on waiting list
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
## 2024-08-07 NOTE — Progress Notes (Signed)
 Chief Complaint: Right leg pain    Discussed the use of AI scribe software for clinical note transcription with the patient, who gave verbal consent to proceed.  History of Present Illness [name] is a 58 year old female with a history of back surgeries who presents with severe right leg pain and numbness.  She experiences severe pain in her right leg, which began suddenly while at the grocery store. The pain starts in the back of the leg and radiates down to her toes, which feel numb. The intensity of the pain prevents her from walking without assistance. An x-ray was performed at the hospital, but it did not show any abnormalities. Due to her pacemaker and defibrillator, an MRI is contraindicated. She has undergone two back surgeries in 2002 for a nerve on the vertebrae, which previously affected her left leg. Her current symptoms involve her right leg, with numbness on the top of her right foot and toes, and a pulling sensation down her leg. She is taking hydrocodone  for pain and duloxetine  prescribed by pain management. She is concerned about her ability to work, as her job involves physical activity, and she is unable to perform her duties due to her current condition.   Surgical History:   Lumbar surgery x2 ~2002  PMH/PSH/Family History/Social History/Meds/Allergies:    Past Medical History:  Diagnosis Date   AICD (automatic cardioverter/defibrillator) present    Anxiety    Arthritis    Blood transfusion without reported diagnosis 1999   due to heavy menses   Breast cancer (HCC) 05/22/2011   03/01/11, Stage 2, s/p lumpectomy, chemo/xrt   CHF (congestive heart failure) (HCC)    Complication of anesthesia    pt woke up during procedure in colonoscopy/endoscopy   DDD (degenerative disc disease), lumbar    Depression 06/22/2011   Diverticulitis    DM (diabetes mellitus) (HCC) 06/22/2011   Genital warts    GERD (gastroesophageal  reflux disease) 06/22/2011   Hx of adenomatous colonic polyps 10/2007   4mm sigmoid tubular adenoma, FH colon cancer, mother in mid-50s   Hypercholesterolemia 06/22/2011   Hypertension 06/22/2011   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   Iron deficiency anemia 03/25/2015   LBBB (left bundle branch block)    Mild CAD    Morbid obesity (HCC)    NICM (nonischemic cardiomyopathy) (HCC)    Personal history of chemotherapy    Personal history of radiation therapy    Presence of permanent cardiac pacemaker    PSVT (paroxysmal supraventricular tachycardia) (HCC)    Shortness of breath    STD (sexually transmitted disease)    HPV, 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   Syncope    Syncope    Vaginal bleeding 03/08/2014   Past Surgical History:  Procedure Laterality Date   back surg x2     BALLOON DILATION N/A 09/22/2021   Procedure: BALLOON DILATION;  Surgeon: Cindie Carlin POUR, DO;  Location: [facility];  Service: Endoscopy;  Laterality: N/A;   BIOPSY  09/22/2021   Procedure: BIOPSY;  Surgeon: Cindie Carlin POUR, DO;  [facility]: [facility];  Service: Endoscopy;;   BIOPSY  12/23/2023   Procedure: BIOPSY;  Surgeon: Cindie Carlin POUR, DO;  [facility];  Service: Endoscopy;;   BIV ICD INSERTION CRT-D N/A  04/23/2023   Procedure: BIV ICD INSERTION CRT-D;  Surgeon: Nancey Eulas BRAVO, MD;  Location: [facility];  Service: Cardiovascular;  Laterality: N/A;   BREAST LUMPECTOMY Right 2012   CHOLECYSTECTOMY     COLONOSCOPY  11/03/07   4-mm sessile polyp removed/small internal hemorrhoids/tubular adenoma, random colon bx negative for microscopic colitis   COLONOSCOPY WITH PROPOFOL  N/A 09/17/2016   Grade 2 hemorrhoids, colonic diverticulosis, ascending colon polyp (sessile serrated adenoma), 5 year surveillance   COLONOSCOPY WITH PROPOFOL  N/A 09/22/2021   Procedure: COLONOSCOPY WITH PROPOFOL ;  Surgeon: Cindie Carlin POUR, DO;  [facility];  Service:  Endoscopy;  Laterality: N/A;  7:30am   COLONOSCOPY, ESOPHAGOGASTRODUODENOSCOPY (EGD) AND ESOPHAGEAL DILATION  01/2012   mild gastritis s/p biopsy. Empiric dilation. Normal duodenum.    DILATATION & CURETTAGE/HYSTEROSCOPY WITH MYOSURE N/A 02/10/2015   Procedure: DILATATION & CURETTAGE/HYSTEROSCOPY WITH MYOSURE;  Surgeon: Bobie BRAVO Cathlyn JAYSON Nikki, MD;  Location: [facility];  Service: Gynecology;  Laterality: N/A;   DILATATION & CURETTAGE/HYSTEROSCOPY WITH MYOSURE N/A 01/24/2024   Procedure: DILATATION & CURETTAGE/HYSTEROSCOPY WITH MYOSURE;  Surgeon: Dallie Vera GAILS, MD;  Location: [facility];  Service: Gynecology;  Laterality: N/A;  [facility]   ESOPHAGOGASTRODUODENOSCOPY (EGD) WITH PROPOFOL  N/A 01/11/2020   Normal esophagus, s/p dilation, normal stomach, normal duodenum.    ESOPHAGOGASTRODUODENOSCOPY (EGD) WITH PROPOFOL  N/A 09/22/2021   Procedure: ESOPHAGOGASTRODUODENOSCOPY (EGD) WITH PROPOFOL ;  Surgeon: Cindie Carlin POUR, DO;  [facility];  Service: Endoscopy;  Laterality: N/A;   ESOPHAGOGASTRODUODENOSCOPY (EGD) WITH PROPOFOL  N/A 12/23/2023   Procedure: ESOPHAGOGASTRODUODENOSCOPY (EGD) WITH PROPOFOL ;  Surgeon: Cindie Carlin POUR, DO;  [facility];  Service: Endoscopy;  Laterality: N/A;  11:15 am, asa 3, pt knows to arrive at 6:15   MALONEY DILATION N/A 01/11/2020   Procedure: AGAPITO DILATION;  Surgeon: Shaaron Lamar HERO, MD;  [facility];  Service: Endoscopy;  Laterality: N/A;   MM BREAST STEREO BX*L*R/S     rt.   POLYPECTOMY  09/17/2016   Procedure: POLYPECTOMY;  Surgeon: Lamar HERO Shaaron, MD;  [facility];  Service: Endoscopy;;  ascending colon   POLYPECTOMY  09/22/2021   Procedure: POLYPECTOMY;  Surgeon: Cindie Carlin POUR, DO;  [facility];  Service: Endoscopy;;   POLYPECTOMY  12/23/2023   Procedure: POLYPECTOMY;  Surgeon: Cindie Carlin POUR, DO;  [facility];  Service: Endoscopy;;   PORT-A-CATH REMOVAL  05/26/2012   Procedure: REMOVAL PORT-A-CATH;   Surgeon: Oneil DELENA Budge, MD;  Location: [facility];  Service: General;  Laterality: N/A;  Minor Room   PORTACATH PLACEMENT     Social History   Socioeconomic History   Marital status: Married    Spouse name: Not on file   Number of children: 3   Years of education: Not on file   Highest education level: Some college, no degree  Occupational History   Occupation: child care    Employer: [employer]  Tobacco Use   Smoking status: Never   Smokeless tobacco: Never  Vaping Use   Vaping status: Never Used  Substance and Sexual Activity   Alcohol use: No    Alcohol/week: 0.0 standard drinks of alcohol   Drug use: No   Sexual activity: Yes    Partners: Male    Birth control/protection: Post-menopausal  Other Topics Concern   Not on file  Social History Narrative   ** Merged History Encounter **       Social Drivers of Health  Financial Resource Strain: Medium Risk (07/19/2024)   Overall Financial Resource Strain (CARDIA)    Difficulty of Paying Living Expenses: Somewhat hard  Food Insecurity: Food Insecurity Present (07/19/2024)   Hunger Vital Sign    Worried About Running Out of Food in the Last Year: Sometimes true    Ran Out of Food in the Last Year: Sometimes true  Transportation Needs: No Transportation Needs (07/19/2024)   PRAPARE - Administrator, Civil Service (Medical): No    Lack of Transportation (Non-Medical): No  Physical Activity: Inactive (07/19/2024)   Exercise Vital Sign    Days of Exercise per Week: 0 days    Minutes of Exercise per Session: Not on file  Stress: Stress Concern Present (07/19/2024)   [employer] of Occupational Health - Occupational Stress Questionnaire    Feeling of Stress: To some extent  Social Connections: Moderately Isolated (07/19/2024)   Social Connection and Isolation Panel    Frequency of Communication with Friends and Family: More than three times a week    Frequency of Social Gatherings with Friends and  Family: Once a week    Attends Religious Services: Never    Database administrator or Organizations: No    Attends Engineer, structural: Not on file    Marital Status: Married   Family History  Problem Relation Age of Onset   Colon cancer Mother        mid-50s, died of metastatic disease   Cancer Mother        liver   Coronary artery disease Mother    Diabetes type I Mother    Peripheral vascular disease Mother        Carotid disease in her 28s   Coronary artery disease Father        CAD in his 4s   Diabetes Father    Diabetes type I Father    Hypertension Father    Bipolar disorder Sister    Coronary artery disease Brother        MI in his 48s   Hypertension Brother    Hypertension Maternal Grandmother    Hyperlipidemia Maternal Grandmother    Stroke Maternal Grandmother    Hypertension Maternal Grandfather    Diabetes Maternal Grandfather    Diabetes Paternal Grandmother    Heart disease Paternal Grandfather    Allergies  Allergen Reactions   Flagyl  [Metronidazole ] Hives, Itching and Swelling   Lansoprazole Other (See Comments)    Stomach pain   Pravastatin  Other (See Comments)    Muscle aches   Spironolactone  Other (See Comments)    Shakiness and jittery   Current Outpatient Medications  Medication Sig Dispense Refill   methylPREDNISolone  (MEDROL  DOSEPAK) 4 MG TBPK tablet Take 6 tablets by mouth on day 1, 5 tabs on day 2, 4 tabs on day 3, 3 tabs on day 4, 2 tabs on day 5, 1 tab on day 6. Then stop. 21 tablet 0   atorvastatin  (LIPITOR) 40 MG tablet Take 1 tablet (40 mg total) by mouth at bedtime. 90 tablet 3   blood glucose meter kit and supplies Dispense based on patient and insurance preference. Use up to four times daily as directed. (FOR ICD-10 E10.9, E11.9). 1 each 5   Blood Glucose Monitoring Suppl DEVI 1 each by Does not apply route 2 (two) times daily. May substitute to any manufacturer covered by patient's insurance. 1 each 0   Blood Pressure KIT  1 Units by Does not apply route  daily. 1 kit 0   cetirizine  (ZYRTEC ) 10 MG tablet Take 1 tablet (10 mg total) by mouth daily. 60 tablet 2   citalopram  (CELEXA ) 40 MG tablet Take 1 tablet by mouth once daily 90 tablet 0   clindamycin  (CLEOCIN ) 100 MG vaginal suppository Place 1 suppository (100 mg total) vaginally at bedtime. 3 suppository 0   dexlansoprazole  (DEXILANT ) 60 MG capsule Take 1 capsule (60 mg total) by mouth daily. 90 capsule 3   Dulaglutide  (TRULICITY ) 0.75 MG/0.5ML SOAJ INJECT 0.75 MG INTO THE SKIN ONCE WEEKLY 4 mL 3   DULoxetine  (CYMBALTA ) 30 MG capsule Take 1 capsule (30 mg total) by mouth daily. 90 capsule 1   empagliflozin  (JARDIANCE ) 10 MG TABS tablet TAKE 1 TABLET BY MOUTH ONCE DAILY BEFORE BREAKFAST 90 tablet 3   fluconazole  (DIFLUCAN ) 150 MG tablet One po qd prn yeast infection; may repeat in 3-4 days if needed 2 tablet 0   fluticasone  (FLONASE ) 50 MCG/ACT nasal spray USE 2 SPRAY(S) IN EACH NOSTRIL ONCE DAILY AS NEEDED 48 g 0   HYDROcodone -acetaminophen  (NORCO) 5-325 MG tablet Take 1 tablet by mouth every 6 (six) hours as needed for moderate pain (pain score 4-6). Do Not fill Before 05/04/2024 120 tablet 0   metFORMIN  (GLUCOPHAGE -XR) 750 MG 24 hr tablet Take 1 tablet (750 mg total) by mouth daily with breakfast. 90 tablet 3   metoprolol  succinate (TOPROL -XL) 50 MG 24 hr tablet Take 1 tablet by mouth once daily 90 tablet 3   naproxen  (NAPROSYN ) 500 MG tablet Take 1 tablet (500 mg total) by mouth 2 (two) times daily with a meal. Take with food to avoid stomach upset. Do not take any additional NSAIDs while on this. You may take tylenol  in addition to this if needed for extra pain relief. 20 tablet 0   ondansetron  (ZOFRAN -ODT) 8 MG disintegrating tablet DISSOLVE 1 TABLET IN MOUTH EVERY 8 HOURS AS NEEDED FOR NAUSEA FOR VOMITING 20 tablet 3   sacubitril -valsartan  (ENTRESTO ) 24-26 MG Take 1 tablet by mouth 2 (two) times daily. 180 tablet 2   Vitamin D , Ergocalciferol , (DRISDOL ) 1.25  MG (50000 UNIT) CAPS capsule Take 1 capsule by mouth once a week 36 capsule 0   No current facility-administered medications for this visit.   No results found.  Review of Systems:   A ROS was performed including pertinent positives and negatives as documented in the HPI.  Physical Exam :   Constitutional: NAD and appears stated age Neurological: Alert and oriented Psych: Appropriate affect and cooperative Last menstrual period 04/05/2016.   Comprehensive Musculoskeletal Exam:    Tenderness in the right lower lumbar musculature extending around to the lateral hip.  No significant midline tenderness.  Patient demonstrates a positive right straight leg raise.  Negative Homans' sign without calf tenderness.  Right lower extremity demonstrates 3/5 strength compared to 4/5 on contralateral side at the knee and ankle.  No foot drop is appreciated.  Imaging:   Xray reviewed from the ED on 08/01/2024 (right knee 2 view): Mild medial joint space narrowing without acute abnormality   I personally reviewed and interpreted the radiographs.      Assessment & Plan Low back pain with right leg radiculopathy   Chronic low back pain has acutely worsened, causing right leg radiculopathy with severe pain, numbness, and weakness. Previous treatments with hydrocodone , duloxetine , and prednisone  offered limited relief. A steroid taper is prescribed for inflammation and pain management, with caution advised against frequent use particularly after completing short course 2  weeks ago. Follow-up with pain management or orthopedic specialists is encouraged for underlying back issues. A work note is provided for rest and recovery until early next week. Consider back injections if symptoms persist.      I personally saw and evaluated the patient, and participated in the management and treatment plan.  Leonce Reveal, PA-C Orthopedics

## 2024-08-07 NOTE — Telephone Encounter (Signed)
 FYI Only or Action Required?: Action required by provider: request for appointment, update on patient condition, and requesting call back.  Patient was last seen in primary care on 07/21/2024 by Mauro Elveria BROCKS, NP.  Called Nurse Triage reporting Pain.  Symptoms began a week ago.  Interventions attempted: Rest, hydration, or home remedies and Other: therapy ordered and appt was canceled.  Symptoms are: gradually worsening.  Triage Disposition: See Physician Within 24 Hours  Patient/caregiver understands and will follow disposition?: No, wishes to speak with PCP   Patient requesting call back . Recommended if sx worsen go back to ED.          Copied from CRM [id number]. Topic: Clinical - Red Word Triage >> Aug 07, 2024  8:31 AM [name] wrote: [facility] that prompted transfer to Nurse Triage: Patient is having pain when walking- was seen in the ED on 08/01/24 and was scheduled a physical rehab appointment today was got cancelled. She return to work on 08/10/24 and cannot walk- using a cain to get around Reason for Disposition  Numbness in a leg or foot (i.e., loss of sensation)    Numbness to right toes  Answer Assessment - Initial Assessment Questions Patient requesting OV today. Difficulty walking on right leg without using cane. Patient reports she can barely walk. Numbness right toes at times. Hx chronic mobility issues with compression in neck and back and left leg now issues with right leg. Unable to schedule appt until 08/12/24. Last seen in ED 08/01/24. Patient out of work and reports difficulty getting appt with PCP. Offered 08/12/24 and patient requesting call back. Therapy ordered and canceled appt and patient unsure how to proceed and wants appt today if possible due to missing work.      1. ONSET: When did the pain start?      Right leg Friday last week 07/31/24 2. LOCATION: Where is the pain located?      Right leg weak gives out 3. PAIN: How bad is the pain?     (Scale 1-10; or mild, moderate, severe)     8/10 sharp pains 4. WORK OR EXERCISE: Has there been any recent work or exercise that involved this part of the body?      no 5. CAUSE: What do you think is causing the leg pain?     Not sure  6. OTHER SYMPTOMS: Do you have any other symptoms? (e.g., chest pain, back pain, breathing difficulty, swelling, rash, fever, numbness, weakness)     Right leg  weakness and pain, hip area and worsens as goes down leg 7. PREGNANCY: Is there any chance you are pregnant? When was your last menstrual period?     Na  Protocols used: Leg Pain-A-AH

## 2024-08-12 ENCOUNTER — Telehealth: Payer: Self-pay | Admitting: Physical Medicine and Rehabilitation

## 2024-08-12 NOTE — Telephone Encounter (Signed)
 P called and stated she in a lot of pain and cannot wait to her appt in November. She is wanting to know what can she do to help manage the pain right now?

## 2024-08-14 ENCOUNTER — Emergency Department (HOSPITAL_COMMUNITY)

## 2024-08-14 ENCOUNTER — Encounter (HOSPITAL_COMMUNITY): Payer: Self-pay

## 2024-08-14 ENCOUNTER — Emergency Department (HOSPITAL_COMMUNITY): Admission: EM | Admit: 2024-08-14 | Discharge: 2024-08-14 | Disposition: A | Discharge status: Home / Self Care

## 2024-08-14 ENCOUNTER — Other Ambulatory Visit: Payer: Self-pay

## 2024-08-14 DIAGNOSIS — M7989 Other specified soft tissue disorders: Secondary | ICD-10-CM | POA: Diagnosis not present

## 2024-08-14 DIAGNOSIS — E1369 Other specified diabetes mellitus with other specified complication: Secondary | ICD-10-CM | POA: Diagnosis not present

## 2024-08-14 DIAGNOSIS — M79604 Pain in right leg: Secondary | ICD-10-CM | POA: Diagnosis present

## 2024-08-14 DIAGNOSIS — M545 Low back pain, unspecified: Secondary | ICD-10-CM | POA: Diagnosis not present

## 2024-08-14 DIAGNOSIS — M79661 Pain in right lower leg: Secondary | ICD-10-CM | POA: Diagnosis not present

## 2024-08-14 DIAGNOSIS — M7121 Synovial cyst of popliteal space [Baker], right knee: Secondary | ICD-10-CM | POA: Insufficient documentation

## 2024-08-14 MED ORDER — HYDROCODONE-ACETAMINOPHEN 5-325 MG PO TABS
1.0000 | ORAL_TABLET | Freq: Once | ORAL | Status: AC
  Administered 2024-08-14: 1 via ORAL
  Filled 2024-08-14: qty 1

## 2024-08-14 NOTE — ED Triage Notes (Signed)
 Pt stated that she was seen here last week for leg pain and has been sent back to us  from Ortho for eval for DVT. Pain and swelling has increased since last visit

## 2024-08-14 NOTE — ED Notes (Signed)
 Pt/family received d/c paperwork at this time. After going over the paperwork any questions, comments, or concerns were answered to the best of this nurse's knowledge. The pt/family verbally acknowledged the teachings/instructions.

## 2024-08-14 NOTE — Discharge Instructions (Addendum)
 Follow-up with your orthopedic specialist for further management of your Baker's cyst.  Continue your pain management regimen as previously directed.  Return to the emergency department if your symptoms worsen.

## 2024-08-14 NOTE — ED Provider Notes (Signed)
 [place] EMERGENCY DEPARTMENT AT [facility] Provider Note   CSN: [id number] Arrival date & time: 08/14/24  1513     Patient presents with: Leg Pain   [name] is a 58 y.o. female.   58 year old female presenting with concern for DVT.  Patient was sent by [facility] for DVT rule out.  Patient was seen in the emergency department on 9/13 for acute right leg pain that began while she was walking around the grocery store, she reports that symptoms have been ongoing since that time, she describes worsening calf swelling as well as calf pain with difficulty straightening her leg/walking secondary to pain.  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  She is not on a blood thinner currently, denies recent surgery/travel/immobilization, is not on any estrogen-containing medications, is not undergoing cancer treatment.    Leg Pain      Prior to Admission medications   Medication Sig Start Date End Date Taking? Authorizing Provider  atorvastatin  (LIPITOR) 40 MG tablet Take 1 tablet (40 mg total) by mouth at bedtime. 04/07/24   Cook, Jayce G, DO  blood glucose meter kit and supplies Dispense based on patient and insurance preference. Use up to four times daily as directed. (FOR ICD-10 E10.9, E11.9). 01/05/20   Alphonsa Elsie RAMAN, MD  Blood Glucose Monitoring Suppl DEVI 1 each by Does not apply route 2 (two) times daily. May substitute to any manufacturer covered by patient's insurance. 07/18/23   Mauro Elveria BROCKS, NP  Blood Pressure KIT 1 Units by Does not apply route daily. 02/28/23   Mallipeddi, Vishnu P, MD  cetirizine  (ZYRTEC ) 10 MG tablet Take 1 tablet (10 mg total) by mouth daily. 05/13/24   Cook, Jayce G, DO  citalopram  (CELEXA ) 40 MG tablet Take 1 tablet by mouth once daily 06/12/24   Grooms, [place], PA-C  clindamycin  (CLEOCIN ) 100 MG  vaginal suppository Place 1 suppository (100 mg total) vaginally at bedtime. 07/28/24   Mauro Elveria BROCKS, NP  dexlansoprazole  (DEXILANT ) 60 MG capsule Take 1 capsule (60 mg total) by mouth daily. 12/05/23   Shirlean Therisa ORN, NP  Dulaglutide  (TRULICITY ) 0.75 MG/0.5ML SOAJ INJECT 0.75 MG INTO THE SKIN ONCE WEEKLY 06/08/24   Cook, Jayce G, DO  DULoxetine  (CYMBALTA ) 30 MG capsule Take 1 capsule (30 mg total) by mouth daily. 04/03/24   Lovorn, Megan, MD  empagliflozin  (JARDIANCE ) 10 MG TABS tablet TAKE 1 TABLET BY MOUTH ONCE DAILY BEFORE BREAKFAST 04/10/24   Mallipeddi, Vishnu P, MD  fluconazole  (DIFLUCAN ) 150 MG tablet One po qd prn yeast infection; may repeat in 3-4 days if needed 07/21/24   Hoskins, Carolyn C, NP  fluticasone  (FLONASE ) 50 MCG/ACT nasal spray USE 2 SPRAY(S) IN EACH NOSTRIL ONCE DAILY AS NEEDED 07/01/24   Cook, Jayce G, DO  HYDROcodone -acetaminophen  (NORCO) 5-325 MG tablet Take 1 tablet by mouth every 6 (six) hours as needed for moderate pain (pain score 4-6). Do Not fill Before 05/04/2024 07/27/24   Lovorn, Megan, MD  metFORMIN  (GLUCOPHAGE -XR) 750 MG 24 hr tablet Take 1 tablet (750 mg total) by mouth daily with breakfast. 04/07/24   Cook, Jayce G, DO  methylPREDNISolone  (MEDROL  DOSEPAK) 4 MG TBPK tablet Take 6 tablets by mouth on day 1, 5 tabs on day 2, 4 tabs on day 3, 3 tabs on day 4, 2 tabs on day 5, 1  tab on day 6. Then stop. 08/07/24   Overturf, Jackson L, PA-C  metoprolol  succinate (TOPROL -XL) 50 MG 24 hr tablet Take 1 tablet by mouth once daily 12/02/23   Mallipeddi, Vishnu P, MD  naproxen  (NAPROSYN ) 500 MG tablet Take 1 tablet (500 mg total) by mouth 2 (two) times daily with a meal. Take with food to avoid stomach upset. Do not take any additional NSAIDs while on this. You may take tylenol  in addition to this if needed for extra pain relief. 07/26/24   Iola Lukes, FNP  ondansetron  (ZOFRAN -ODT) 8 MG disintegrating tablet DISSOLVE 1 TABLET IN MOUTH EVERY 8 HOURS AS NEEDED FOR NAUSEA FOR  VOMITING 07/29/24   Cook, Jayce G, DO  sacubitril -valsartan  (ENTRESTO ) 24-26 MG Take 1 tablet by mouth 2 (two) times daily. 05/25/24   Mallipeddi, Vishnu P, MD  Vitamin D , Ergocalciferol , (DRISDOL ) 1.25 MG (50000 UNIT) CAPS capsule Take 1 capsule by mouth once a week 04/08/24   Rogers Hai, MD    Allergies: Flagyl  [metronidazole ], Lansoprazole, Pravastatin , and Spironolactone     Review of Systems  Updated Vital Signs  Vitals:   08/14/24 1530 08/14/24 1532 08/14/24 1533  BP:   (!) 118/54  Pulse:   96  Resp:  16 16  Temp:  98.7 F (37.1 C) 98.7 F (37.1 C)  TempSrc:   Oral  SpO2:   96%  Weight: 103.5 kg    Height: 5' 4 (1.626 m)       Physical Exam Vitals and nursing note reviewed.  HENT:     Head: Normocephalic.  Eyes:     Extraocular Movements: Extraocular movements intact.  Cardiovascular:     Rate and Rhythm: Normal rate.  Pulmonary:     Effort: Pulmonary effort is normal.  Musculoskeletal:     Cervical back: Normal range of motion.     Right lower leg: Edema (nonpitting) present.     Left lower leg: No edema.     Comments: LE's: Calves are asymmetric, with R calf larger as compared to left. TTP of R posterior calf, no appreciable erythema/warmth, + Homan's. 2+ DP pulses bilaterally.   Skin:    General: Skin is warm and dry.  Neurological:     Mental Status: She is alert and oriented to person, place, and time.     (all labs ordered are listed, but only abnormal results are displayed) Labs Reviewed - No data to display  EKG: None  Radiology: US  Venous Img Lower Right (DVT Study) Result Date: 08/14/2024 CLINICAL DATA:  Right lower extremity pain. EXAM: RIGHT LOWER EXTREMITY VENOUS DOPPLER ULTRASOUND TECHNIQUE: Gray-scale sonography with graded compression, as well as color Doppler and duplex ultrasound were performed to evaluate the lower extremity deep venous systems from the level of the common femoral vein and including the common femoral, femoral,  profunda femoral, popliteal and calf veins including the posterior tibial, peroneal and gastrocnemius veins when visible. The superficial great saphenous vein was also interrogated. Spectral Doppler was utilized to evaluate flow at rest and with distal augmentation maneuvers in the common femoral, femoral and popliteal veins. COMPARISON:  None Available. FINDINGS: Contralateral Common Femoral Vein: Respiratory phasicity is normal and symmetric with the symptomatic side. No evidence of thrombus. Normal compressibility. Common Femoral Vein: No evidence of thrombus. Normal compressibility, respiratory phasicity and response to augmentation. Saphenofemoral Junction: No evidence of thrombus. Normal compressibility and flow on color Doppler imaging. Profunda Femoral Vein: No evidence of thrombus. Normal compressibility and flow on color Doppler imaging. Femoral Vein:  No evidence of thrombus. Normal compressibility, respiratory phasicity and response to augmentation. Popliteal Vein: No evidence of thrombus. Normal compressibility, respiratory phasicity and response to augmentation. Calf Veins: No evidence of thrombus. Normal compressibility and flow on color Doppler imaging. Superficial Great Saphenous Vein: No evidence of thrombus. Normal compressibility. Venous Reflux:  None. Other Findings: No evidence of superficial thrombophlebitis. Small complex Baker's cyst of the right popliteal fossa measures approximately 3.5 x 0.9 x 2.8 cm. IMPRESSION: 1. No evidence of right lower extremity deep vein thrombosis. 2. Small complex Baker's cyst of the right popliteal fossa. Electronically Signed   By: Marcey Moan M.D.   On: 08/14/2024 16:49     Procedures   Medications Ordered in the ED  HYDROcodone -acetaminophen  (NORCO/VICODIN) 5-325 MG per tablet 1 tablet (1 tablet Oral Given 08/14/24 1715)                                    Medical Decision Making This patient presents to the ED for concern of right calf  swelling, this involves an extensive number of treatment options, and is a complaint that carries with it a high risk of complications and morbidity.  The differential diagnosis includes DVT, dependent edema vs venous insufficiency, cellulitis/other soft tissue skin infection   Co morbidities that complicate the patient evaluation  Diabetes, neuropathy   Additional history obtained:  Additional history obtained from record review External records from outside source obtained and reviewed including recent ED/ortho notes   Imaging Studies ordered:  I ordered imaging studies including DVT US  RLE  I independently visualized and interpreted imaging which showed 1. No evidence of right lower extremity deep vein thrombosis. 2. Small complex Baker's cyst of the right popliteal fossa.  I agree with the radiologist interpretation   Cardiac Monitoring: / EKG:  The patient was maintained on a cardiac monitor.  I personally viewed and interpreted the cardiac monitored which showed an underlying rhythm of: NSR   Problem List / ED Course / Critical interventions / Medication management  I ordered medication including Norco  for RLE pain  Reevaluation of the patient after these medicines showed that the patient improved I have reviewed the patients home medicines and have made adjustments as needed   Social Determinants of Health:  Depression, financial instability   Test / Admission - Considered:  Physical exam notable as above.  DVT ultrasound study reassuring, no evidence of DVT however patient does have Baker's cyst, I suspect that this may be contributing to her symptoms today.  I discussed these findings in depth with the patient, I recommend that she follow-up with her orthopedist in regard to further management of this condition.  She voiced understanding is in agreement this plan, return precautions discussed.  She is appropriate for discharge at this time.    Risk Prescription  drug management.        Final diagnoses:  Synovial cyst of right popliteal space    ED Discharge Orders     None          [name], [place] 08/14/24 1719    Jill Lavonia SAILOR, MD 08/14/24 2318

## 2024-08-17 ENCOUNTER — Encounter: Payer: Self-pay | Admitting: Nurse Practitioner

## 2024-08-17 DIAGNOSIS — M25561 Pain in right knee: Secondary | ICD-10-CM | POA: Diagnosis not present

## 2024-08-17 NOTE — Telephone Encounter (Signed)
 Message sent to patient

## 2024-08-18 DIAGNOSIS — M7121 Synovial cyst of popliteal space [Baker], right knee: Secondary | ICD-10-CM | POA: Diagnosis not present

## 2024-08-21 ENCOUNTER — Other Ambulatory Visit: Payer: Self-pay | Admitting: Physician Assistant

## 2024-08-21 ENCOUNTER — Other Ambulatory Visit: Payer: Self-pay | Admitting: Nurse Practitioner

## 2024-08-21 DIAGNOSIS — F324 Major depressive disorder, single episode, in partial remission: Secondary | ICD-10-CM

## 2024-08-24 ENCOUNTER — Other Ambulatory Visit: Payer: Self-pay | Admitting: Family Medicine

## 2024-08-24 DIAGNOSIS — J302 Other seasonal allergic rhinitis: Secondary | ICD-10-CM

## 2024-08-24 MED ORDER — FLUTICASONE PROPIONATE 50 MCG/ACT NA SUSP: 2.0000 | Freq: Every day | NASAL | 0 refills | Status: DC | PRN

## 2024-08-24 NOTE — Telephone Encounter (Unsigned)
 Copied from CRM [id number]. Topic: Clinical - Medication Refill >> Aug 24, 2024  9:33 AM Tinnie C wrote: Medication: fluticasone  (FLONASE ) 50 MCG/ACT nasal spray  Has the patient contacted their pharmacy? No (Agent: If no, request that the patient contact the pharmacy for the refill. If patient does not wish to contact the pharmacy document the reason why and proceed with request.) (Agent: If yes, when and what did the pharmacy advise?)  This is the patient's preferred pharmacy:  [facility] [address] [place] - [address] [place] [address] Phone: [phone] Fax: [phone]  Is this the correct pharmacy for this prescription? Yes If no, delete pharmacy and type the correct one.   Has the prescription been filled recently? Yes  Is the patient out of the medication? Almost  Has the patient been seen for an appointment in the last year OR does the patient have an upcoming appointment? Yes  Can we respond through MyChart? No, phone call  Agent: Please be advised that Rx refills may take up to 3 business days. We ask that you follow-up with your pharmacy.

## 2024-08-25 ENCOUNTER — Other Ambulatory Visit: Payer: Self-pay | Admitting: Nurse Practitioner

## 2024-08-25 ENCOUNTER — Telehealth: Payer: Self-pay

## 2024-08-25 NOTE — Telephone Encounter (Signed)
 Patient called for the Hydrocodone  5-325 refill. She has been taking one & one half tabs daily for increased pain. Please send to [facility] in [place].  Call back phone [phone].   Filled  Written  ID  Drug  QTY  Days  Prescriber  RX #  Dispenser  Refill  Daily Dose*  Pymt Type  PMP  07/28/2024 07/27/2024 2  Hydrocodone -Acetamin 5-325 Mg 120.00 30 [facility] Lov [id number] [id number] 0/0 20.00 MME Medicaid [place] 06/30/2024 06/30/2024 2  Hydrocodone -Acetamin 5-325 Mg 120.00 30 Me Lov [id number] [facility] ([id number]) 0/0 20.00 MME Medicaid [place]

## 2024-08-26 ENCOUNTER — Other Ambulatory Visit: Payer: Self-pay | Admitting: Family Medicine

## 2024-08-26 ENCOUNTER — Telehealth: Payer: Self-pay | Admitting: Registered Nurse

## 2024-08-26 ENCOUNTER — Encounter: Payer: Self-pay | Admitting: Physical Medicine and Rehabilitation

## 2024-08-26 DIAGNOSIS — M545 Low back pain, unspecified: Secondary | ICD-10-CM

## 2024-08-26 DIAGNOSIS — G8929 Other chronic pain: Secondary | ICD-10-CM

## 2024-08-26 DIAGNOSIS — J302 Other seasonal allergic rhinitis: Secondary | ICD-10-CM

## 2024-08-26 DIAGNOSIS — M542 Cervicalgia: Secondary | ICD-10-CM

## 2024-08-26 DIAGNOSIS — G894 Chronic pain syndrome: Secondary | ICD-10-CM

## 2024-08-26 MED ORDER — HYDROCODONE-ACETAMINOPHEN 7.5-325 MG PO TABS: 1.0000 | ORAL_TABLET | Freq: Every day | ORAL | 0 refills | Status: DC | PRN

## 2024-08-26 NOTE — Telephone Encounter (Signed)
 P calling again for meds

## 2024-08-26 NOTE — Telephone Encounter (Signed)
 Call placed to [name], she reports increase cervical, lower back and bilateral knee pain. She is only receiving 4 hours of relief with her current medication regimen. Hydrocodone  frequency was changed, she has a scheduled appointment with Dr Cornelio next month, she verbalizes understanding.

## 2024-08-26 NOTE — Telephone Encounter (Unsigned)
 Copied from CRM #[id number]. Topic: Clinical - Medication Refill >> Aug 26, 2024 12:15 PM Everette C wrote: Medication: fluticasone  (FLONASE ) 50 MCG/ACT nasal spray  Has the patient contacted their pharmacy? Yes (Agent: If no, request that the patient contact the pharmacy for the refill. If patient does not wish to contact the pharmacy document the reason why and proceed with request.) (Agent: If yes, when and what did the pharmacy advise?)  This is the patient's preferred pharmacy:  [facility] [address] [place] - [address] [place] [address] Phone: [phone] Fax: [phone]  Is this the correct pharmacy for this prescription? Yes If no, delete pharmacy and type the correct one.   Has the prescription been filled recently? Yes  Is the patient out of the medication? Yes  Has the patient been seen for an appointment in the last year OR does the patient have an upcoming appointment? Yes  Can we respond through MyChart? No  Agent: Please be advised that Rx refills may take up to 3 business days. We ask that you follow-up with your pharmacy.

## 2024-08-27 MED ORDER — FLUTICASONE PROPIONATE 50 MCG/ACT NA SUSP
2.0000 | Freq: Every day | NASAL | 0 refills | Status: AC | PRN
Start: 2024-08-27 — End: ?

## 2024-08-30 DIAGNOSIS — Z419 Encounter for procedure for purposes other than remedying health state, unspecified: Secondary | ICD-10-CM | POA: Diagnosis not present

## 2024-09-01 ENCOUNTER — Ambulatory Visit: Admitting: Cardiovascular Disease

## 2024-09-09 NOTE — Telephone Encounter (Signed)
 Task completed.

## 2024-09-16 ENCOUNTER — Telehealth: Payer: Self-pay

## 2024-09-16 NOTE — Telephone Encounter (Signed)
 Copied from CRM [id number]. Topic: General - Call Back - No Documentation >> Sep 16, 2024 11:55 AM Devaughn RAMAN wrote: Reason for CRM: Pt is returning [place] phone call, contacted [facility] [place] no answer. Pt has an appt on tomorrow and would like to know if her appt needs to be changed or cancelled, please f/u with pt. Called for precharting

## 2024-09-16 NOTE — Progress Notes (Deleted)
 New Patient Pulmonology Office Visit   Subjective:  Patient ID: [name], female    DOB: [birth year]  MRN: [id number]  Referred by: Mallipeddi, Vishnu P, MD  CC: No chief complaint on file.   HPI [name] is a 58 y.o. female with hx of DM, HLD, HTN, and morbid obesity who presents for initial evaluation of sleep disordered breathing.  The Epworth Sleepiness score is ***/24.   {STOPBANG:33649}   {PULM QUESTIONNAIRES (Optional):33196}  ROS  Allergies: Flagyl  [metronidazole ], Lansoprazole, Pravastatin , and Spironolactone   Current Outpatient Medications:    atorvastatin  (LIPITOR) 40 MG tablet, Take 1 tablet (40 mg total) by mouth at bedtime., Disp: 90 tablet, Rfl: 3   blood glucose meter kit and supplies, Dispense based on patient and insurance preference. Use up to four times daily as directed. (FOR ICD-10 E10.9, E11.9)., Disp: 1 each, Rfl: 5   Blood Glucose Monitoring Suppl DEVI, 1 each by Does not apply route 2 (two) times daily. May substitute to any manufacturer covered by patient's insurance., Disp: 1 each, Rfl: 0   Blood Pressure KIT, 1 Units by Does not apply route daily., Disp: 1 kit, Rfl: 0   cetirizine  (ZYRTEC ) 10 MG tablet, Take 1 tablet (10 mg total) by mouth daily., Disp: 60 tablet, Rfl: 2   citalopram  (CELEXA ) 40 MG tablet, Take 1 tablet by mouth once daily, Disp: 90 tablet, Rfl: 0   clindamycin  (CLEOCIN ) 100 MG vaginal suppository, Place 1 suppository (100 mg total) vaginally at bedtime., Disp: 3 suppository, Rfl: 0   dexlansoprazole  (DEXILANT ) 60 MG capsule, Take 1 capsule (60 mg total) by mouth daily., Disp: 90 capsule, Rfl: 3   Dulaglutide  (TRULICITY ) 0.75 MG/0.5ML SOAJ, INJECT 0.75 MG INTO THE SKIN ONCE WEEKLY, Disp: 4 mL, Rfl: 3   DULoxetine  (CYMBALTA ) 30 MG capsule, Take 1 capsule (30 mg total) by mouth daily., Disp: 90 capsule, Rfl: 1   empagliflozin  (JARDIANCE ) 10 MG TABS tablet, TAKE 1 TABLET BY MOUTH ONCE DAILY BEFORE BREAKFAST, Disp: 90  tablet, Rfl: 3   fluconazole  (DIFLUCAN ) 150 MG tablet, One po qd prn yeast infection; may repeat in 3-4 days if needed, Disp: 2 tablet, Rfl: 0   fluticasone  (FLONASE ) 50 MCG/ACT nasal spray, Place 2 sprays into both nostrils daily as needed for allergies or rhinitis. USE 2 SPRAY(S) IN EACH NOSTRIL ONCE DAILY AS NEEDED, Disp: 48 g, Rfl: 0   HYDROcodone -acetaminophen  (NORCO) 7.5-325 MG tablet, Take 1 tablet by mouth 5 (five) times daily as needed for moderate pain (pain score 4-6)., Disp: 150 tablet, Rfl: 0   metFORMIN  (GLUCOPHAGE -XR) 750 MG 24 hr tablet, Take 1 tablet (750 mg total) by mouth daily with breakfast., Disp: 90 tablet, Rfl: 3   methylPREDNISolone  (MEDROL  DOSEPAK) 4 MG TBPK tablet, Take 6 tablets by mouth on day 1, 5 tabs on day 2, 4 tabs on day 3, 3 tabs on day 4, 2 tabs on day 5, 1 tab on day 6. Then stop., Disp: 21 tablet, Rfl: 0   metoprolol  succinate (TOPROL -XL) 50 MG 24 hr tablet, Take 1 tablet by mouth once daily, Disp: 90 tablet, Rfl: 3   naproxen  (NAPROSYN ) 500 MG tablet, Take 1 tablet (500 mg total) by mouth 2 (two) times daily with a meal. Take with food to avoid stomach upset. Do not take any additional NSAIDs while on this. You may take tylenol  in addition to this if needed for extra pain relief., Disp: 20 tablet, Rfl: 0   ondansetron  (ZOFRAN -ODT) 8 MG disintegrating tablet, DISSOLVE  1 TABLET IN MOUTH EVERY 8 HOURS AS NEEDED FOR NAUSEA FOR VOMITING, Disp: 20 tablet, Rfl: 3   sacubitril -valsartan  (ENTRESTO ) 24-26 MG, Take 1 tablet by mouth 2 (two) times daily., Disp: 180 tablet, Rfl: 2   Vitamin D , Ergocalciferol , (DRISDOL ) 1.25 MG (50000 UNIT) CAPS capsule, Take 1 capsule by mouth once a week, Disp: 36 capsule, Rfl: 0 Past Medical History:  Diagnosis Date   AICD (automatic cardioverter/defibrillator) present    Anxiety    Arthritis    Blood transfusion without reported diagnosis 1999   due to heavy menses   Breast cancer (HCC) 05/22/2011   03/01/11, Stage 2, s/p lumpectomy,  chemo/xrt   CHF (congestive heart failure) (HCC)    Complication of anesthesia    pt woke up during procedure in colonoscopy/endoscopy   DDD (degenerative disc disease), lumbar    Depression 06/22/2011   Diverticulitis    DM (diabetes mellitus) (HCC) 06/22/2011   Genital warts    GERD (gastroesophageal reflux disease) 06/22/2011   Hx of adenomatous colonic polyps 10/2007   4mm sigmoid tubular adenoma, FH colon cancer, mother in mid-50s   Hypercholesterolemia 06/22/2011   Hypertension 06/22/2011   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   Iron deficiency anemia 03/25/2015   LBBB (left bundle branch block)    Mild CAD    Morbid obesity (HCC)    NICM (nonischemic cardiomyopathy) (HCC)    Personal history of chemotherapy    Personal history of radiation therapy    Presence of permanent cardiac pacemaker    PSVT (paroxysmal supraventricular tachycardia)    Shortness of breath    STD (sexually transmitted disease)    HPV, 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   Syncope    Syncope    Vaginal bleeding 03/08/2014   Past Surgical History:  Procedure Laterality Date   back surg x2     BALLOON DILATION N/A 09/22/2021   Procedure: BALLOON DILATION;  Surgeon: Cindie Carlin POUR, DO;  [facility]: [facility];  Service: Endoscopy;  Laterality: N/A;   BIOPSY  09/22/2021   Procedure: BIOPSY;  Surgeon: Cindie Carlin POUR, DO;  [facility];  Service: Endoscopy;;   BIOPSY  12/23/2023   Procedure: BIOPSY;  Surgeon: Cindie Carlin POUR, DO;  [facility];  Service: Endoscopy;;   BIV ICD INSERTION CRT-D N/A 04/23/2023   Procedure: BIV ICD INSERTION CRT-D;  Surgeon: Nancey Eulas BRAVO, MD;  Location: [facility] IN[facility] CV [facility];  Service: Cardiovascular;  Laterality: N/A;   BREAST LUMPECTOMY Right 2012   CHOLECYSTECTOMY     COLONOSCOPY  11/03/07   4-mm sessile polyp removed/small internal hemorrhoids/tubular adenoma, random colon bx negative for microscopic colitis   COLONOSCOPY  WITH PROPOFOL  N/A 09/17/2016   Grade 2 hemorrhoids, colonic diverticulosis, ascending colon polyp (sessile serrated adenoma), 5 year surveillance   COLONOSCOPY WITH PROPOFOL  N/A 09/22/2021   Procedure: COLONOSCOPY WITH PROPOFOL ;  Surgeon: Cindie Carlin POUR, DO;  [facility];  Service: Endoscopy;  Laterality: N/A;  7:30am   COLONOSCOPY, ESOPHAGOGASTRODUODENOSCOPY (EGD) AND ESOPHAGEAL DILATION  01/2012   mild gastritis s/p biopsy. Empiric dilation. Normal duodenum.    DILATATION & CURETTAGE/HYSTEROSCOPY WITH MYOSURE N/A 02/10/2015   Procedure: DILATATION & CURETTAGE/HYSTEROSCOPY WITH MYOSURE;  Surgeon: Bobie BRAVO Cathlyn JAYSON Nikki, MD;  Location: [facility];  Service: Gynecology;  Laterality: N/A;   DILATATION & CURETTAGE/HYSTEROSCOPY WITH MYOSURE N/A 01/24/2024   Procedure: DILATATION & CURETTAGE/HYSTEROSCOPY WITH MYOSURE;  Surgeon: Dallie Vera GAILS, MD;  Location: [facility];  Service: Gynecology;  Laterality: N/A;  [facility]   ESOPHAGOGASTRODUODENOSCOPY (EGD) WITH PROPOFOL  N/A 01/11/2020   Normal esophagus, s/p dilation, normal stomach, normal duodenum.    ESOPHAGOGASTRODUODENOSCOPY (EGD) WITH PROPOFOL  N/A 09/22/2021   Procedure: ESOPHAGOGASTRODUODENOSCOPY (EGD) WITH PROPOFOL ;  Surgeon: Cindie Carlin POUR, DO;  [facility];  Service: Endoscopy;  Laterality: N/A;   ESOPHAGOGASTRODUODENOSCOPY (EGD) WITH PROPOFOL  N/A 12/23/2023   Procedure: ESOPHAGOGASTRODUODENOSCOPY (EGD) WITH PROPOFOL ;  Surgeon: Cindie Carlin POUR, DO;  [facility];  Service: Endoscopy;  Laterality: N/A;  11:15 am, asa 3, pt knows to arrive at 6:15   MALONEY DILATION N/A 01/11/2020   Procedure: AGAPITO DILATION;  Surgeon: Shaaron Lamar HERO, MD;  [facility];  Service: Endoscopy;  Laterality: N/A;   MM BREAST STEREO BX*L*R/S     rt.   POLYPECTOMY  09/17/2016   Procedure: POLYPECTOMY;  Surgeon: Lamar HERO Shaaron, MD;  [facility];  Service: Endoscopy;;  ascending colon   POLYPECTOMY  09/22/2021    Procedure: POLYPECTOMY;  Surgeon: Cindie Carlin POUR, DO;  [facility];  Service: Endoscopy;;   POLYPECTOMY  12/23/2023   Procedure: POLYPECTOMY;  Surgeon: Cindie Carlin POUR, DO;  [facility];  Service: Endoscopy;;   PORT-A-CATH REMOVAL  05/26/2012   Procedure: REMOVAL PORT-A-CATH;  Surgeon: Oneil DELENA Budge, MD;  Location: [facility];  Service: General;  Laterality: N/A;  Minor Room   PORTACATH PLACEMENT     Family History  Problem Relation Age of Onset   Colon cancer Mother        mid-50s, died of metastatic disease   Cancer Mother        liver   Coronary artery disease Mother    Diabetes type I Mother    Peripheral vascular disease Mother        Carotid disease in her 75s   Coronary artery disease Father        CAD in his 47s   Diabetes Father    Diabetes type I Father    Hypertension Father    Bipolar disorder Sister    Coronary artery disease Brother        MI in his 40s   Hypertension Brother    Hypertension Maternal Grandmother    Hyperlipidemia Maternal Grandmother    Stroke Maternal Grandmother    Hypertension Maternal Grandfather    Diabetes Maternal Grandfather    Diabetes Paternal Grandmother    Heart disease Paternal Grandfather    Social History   Socioeconomic History   Marital status: Married    Spouse name: Not on file   Number of children: 3   Years of education: Not on file   Highest education level: Some college, no degree  Occupational History   Occupation: child care    Employer: [employer]  Tobacco Use   Smoking status: Never   Smokeless tobacco: Never  Vaping Use   Vaping status: Never Used  Substance and Sexual Activity   Alcohol use: No    Alcohol/week: 0.0 standard drinks of alcohol   Drug use: No   Sexual activity: Yes    Partners: Male    Birth control/protection: Post-menopausal  Other Topics Concern   Not on file  Social History Narrative   ** Merged History Encounter **       Social Drivers of Health    Financial Resource Strain: Medium Risk (07/19/2024)   Overall Financial Resource Strain (CARDIA)    Difficulty of Paying Living Expenses:  Somewhat hard  Food Insecurity: Food Insecurity Present (07/19/2024)   Hunger Vital Sign    Worried About Running Out of Food in the Last Year: Sometimes true    Ran Out of Food in the Last Year: Sometimes true  Transportation Needs: No Transportation Needs (07/19/2024)   PRAPARE - Administrator, Civil Service (Medical): No    Lack of Transportation (Non-Medical): No  Physical Activity: Inactive (07/19/2024)   Exercise Vital Sign    Days of Exercise per Week: 0 days    Minutes of Exercise per Session: Not on file  Stress: Stress Concern Present (07/19/2024)   [employer] of Occupational Health - Occupational Stress Questionnaire    Feeling of Stress: To some extent  Social Connections: Moderately Isolated (07/19/2024)   Social Connection and Isolation Panel    Frequency of Communication with Friends and Family: More than three times a week    Frequency of Social Gatherings with Friends and Family: Once a week    Attends Religious Services: Never    Database Administrator or Organizations: No    Attends Engineer, Structural: Not on file    Marital Status: Married  Catering Manager Violence: Not on file       Objective:  LMP 04/05/2016 Comment: spotting 11/2016 and 12/2016 {Pulm Vitals (Optional):32837}  Physical Exam  Diagnostic Review:  {Labs (Optional):32838}     Assessment & Plan:   Assessment & Plan   No orders of the defined types were placed in this encounter.     No follow-ups on file.   Anjenette Gerbino, MD

## 2024-09-17 ENCOUNTER — Ambulatory Visit: Admitting: Pulmonary Disease

## 2024-09-19 ENCOUNTER — Other Ambulatory Visit: Payer: Self-pay | Admitting: Family Medicine

## 2024-09-19 DIAGNOSIS — E1165 Type 2 diabetes mellitus with hyperglycemia: Secondary | ICD-10-CM

## 2024-09-21 ENCOUNTER — Encounter: Payer: Self-pay | Admitting: Radiology

## 2024-09-24 ENCOUNTER — Other Ambulatory Visit: Payer: Self-pay | Admitting: Nurse Practitioner

## 2024-09-25 ENCOUNTER — Emergency Department (HOSPITAL_COMMUNITY): Admission: EM | Admit: 2024-09-25 | Discharge: 2024-09-25 | Disposition: A | Discharge status: Home / Self Care

## 2024-09-25 ENCOUNTER — Encounter: Payer: Self-pay | Admitting: Physical Medicine and Rehabilitation

## 2024-09-25 ENCOUNTER — Encounter (HOSPITAL_COMMUNITY): Payer: Self-pay | Admitting: Emergency Medicine

## 2024-09-25 ENCOUNTER — Emergency Department (HOSPITAL_COMMUNITY)

## 2024-09-25 ENCOUNTER — Other Ambulatory Visit: Payer: Self-pay

## 2024-09-25 ENCOUNTER — Encounter: Attending: Physical Medicine and Rehabilitation | Admitting: Physical Medicine and Rehabilitation

## 2024-09-25 VITALS — BP 168/85 | HR 87 | Ht 64.0 in | Wt 225.8 lb

## 2024-09-25 DIAGNOSIS — M5442 Lumbago with sciatica, left side: Secondary | ICD-10-CM | POA: Diagnosis not present

## 2024-09-25 DIAGNOSIS — M542 Cervicalgia: Secondary | ICD-10-CM | POA: Insufficient documentation

## 2024-09-25 DIAGNOSIS — R0789 Other chest pain: Secondary | ICD-10-CM | POA: Diagnosis not present

## 2024-09-25 DIAGNOSIS — Z79899 Other long term (current) drug therapy: Secondary | ICD-10-CM | POA: Insufficient documentation

## 2024-09-25 DIAGNOSIS — R079 Chest pain, unspecified: Secondary | ICD-10-CM | POA: Diagnosis not present

## 2024-09-25 DIAGNOSIS — M5441 Lumbago with sciatica, right side: Secondary | ICD-10-CM | POA: Insufficient documentation

## 2024-09-25 DIAGNOSIS — Z79891 Long term (current) use of opiate analgesic: Secondary | ICD-10-CM | POA: Diagnosis not present

## 2024-09-25 DIAGNOSIS — M25562 Pain in left knee: Secondary | ICD-10-CM | POA: Diagnosis not present

## 2024-09-25 DIAGNOSIS — R1310 Dysphagia, unspecified: Secondary | ICD-10-CM | POA: Diagnosis not present

## 2024-09-25 DIAGNOSIS — Z5181 Encounter for therapeutic drug level monitoring: Secondary | ICD-10-CM | POA: Diagnosis not present

## 2024-09-25 DIAGNOSIS — M25561 Pain in right knee: Secondary | ICD-10-CM | POA: Diagnosis not present

## 2024-09-25 DIAGNOSIS — G959 Disease of spinal cord, unspecified: Secondary | ICD-10-CM | POA: Insufficient documentation

## 2024-09-25 DIAGNOSIS — G894 Chronic pain syndrome: Secondary | ICD-10-CM | POA: Diagnosis not present

## 2024-09-25 DIAGNOSIS — M545 Low back pain, unspecified: Secondary | ICD-10-CM | POA: Insufficient documentation

## 2024-09-25 DIAGNOSIS — Z7984 Long term (current) use of oral hypoglycemic drugs: Secondary | ICD-10-CM | POA: Insufficient documentation

## 2024-09-25 DIAGNOSIS — G8929 Other chronic pain: Secondary | ICD-10-CM | POA: Insufficient documentation

## 2024-09-25 MED ORDER — HYDROCODONE-ACETAMINOPHEN 7.5-325 MG PO TABS: 1.0000 | ORAL_TABLET | Freq: Four times a day (QID) | ORAL | 0 refills | Status: DC | PRN

## 2024-09-25 MED ORDER — BELBUCA 150 MCG BU FILM: 150.0000 ug | ORAL_FILM | Freq: Two times a day (BID) | BUCCAL | 0 refills | Status: DC

## 2024-09-25 MED ORDER — HYDROCODONE-ACETAMINOPHEN 7.5-325 MG PO TABS: 1.0000 | ORAL_TABLET | Freq: Every day | ORAL | 0 refills | Status: DC | PRN

## 2024-09-25 NOTE — ED Notes (Signed)
 Pt provided with 240 ml of water  for PO challenge per MD

## 2024-09-25 NOTE — ED Provider Notes (Signed)
 [place] EMERGENCY DEPARTMENT AT [facility] Provider Note   CSN: [id number] Arrival date & time: 09/25/24  [id number]     Patient presents with: No chief complaint on file.   [name] is a 58 y.o. female.   58 year old female presents for evaluation of difficulty swallowing.  She states this morning she was taking pills and ginger ale and felt like the ginger ale got stuck.  She states she felt short of breath but eventually she states she was able to cough everything up.  She states she no longer feels short of breath.  She states she still has a soreness in her chest.        Prior to Admission medications   Medication Sig Start Date End Date Taking? Authorizing Provider  atorvastatin  (LIPITOR) 40 MG tablet Take 1 tablet (40 mg total) by mouth at bedtime. 04/07/24   Cook, Jayce G, DO  blood glucose meter kit and supplies Dispense based on patient and insurance preference. Use up to four times daily as directed. (FOR ICD-10 E10.9, E11.9). 01/05/20   Alphonsa Elsie RAMAN, MD  Blood Glucose Monitoring Suppl DEVI 1 each by Does not apply route 2 (two) times daily. May substitute to any manufacturer covered by patient's insurance. 07/18/23   Mauro Elveria BROCKS, NP  Blood Pressure KIT 1 Units by Does not apply route daily. 02/28/23   Mallipeddi, Vishnu P, MD  cetirizine  (ZYRTEC ) 10 MG tablet Take 1 tablet (10 mg total) by mouth daily. 05/13/24   Cook, Jayce G, DO  citalopram  (CELEXA ) 40 MG tablet Take 1 tablet by mouth once daily 08/24/24   Grooms, Newberry, PA-C  clindamycin  (CLEOCIN ) 100 MG vaginal suppository Place 1 suppository (100 mg total) vaginally at bedtime. 07/28/24   Mauro Elveria BROCKS, NP  dexlansoprazole  (DEXILANT ) 60 MG capsule Take 1 capsule (60 mg total) by mouth daily. 12/05/23   Shirlean Therisa ORN, NP  DULoxetine  (CYMBALTA ) 30 MG capsule Take 1 capsule (30 mg total) by mouth daily. 04/03/24   Lovorn, Bobbette Eakes, MD  empagliflozin  (JARDIANCE ) 10 MG TABS tablet TAKE 1 TABLET BY  MOUTH ONCE DAILY BEFORE BREAKFAST 04/10/24   Mallipeddi, Vishnu P, MD  fluconazole  (DIFLUCAN ) 150 MG tablet TAKE 1 TABLET BY MOUTH ONCE DAILY AS NEEDED FOR YEAST INFECTION, MAY REPEAT IN 3-4 DAYS IF NEEDED. 09/25/24   Cook, Jayce G, DO  fluticasone  (FLONASE ) 50 MCG/ACT nasal spray Place 2 sprays into both nostrils daily as needed for allergies or rhinitis. USE 2 SPRAY(S) IN EACH NOSTRIL ONCE DAILY AS NEEDED 08/27/24   Cook, Jayce G, DO  HYDROcodone -acetaminophen  (NORCO) 7.5-325 MG tablet Take 1 tablet by mouth 5 (five) times daily as needed for moderate pain (pain score 4-6). 08/26/24   Debby Fidela LITTIE, NP  metFORMIN  (GLUCOPHAGE -XR) 750 MG 24 hr tablet Take 1 tablet (750 mg total) by mouth daily with breakfast. 04/07/24   Cook, Jayce G, DO  methylPREDNISolone  (MEDROL  DOSEPAK) 4 MG TBPK tablet Take 6 tablets by mouth on day 1, 5 tabs on day 2, 4 tabs on day 3, 3 tabs on day 4, 2 tabs on day 5, 1 tab on day 6. Then stop. 08/07/24   Overturf, Jackson L, PA-C  metoprolol  succinate (TOPROL -XL) 50 MG 24 hr tablet Take 1 tablet by mouth once daily 12/02/23   Mallipeddi, Vishnu P, MD  naproxen  (NAPROSYN ) 500 MG tablet Take 1 tablet (500 mg total) by mouth 2 (two) times daily with a meal. Take with food to avoid stomach upset.  Do not take any additional NSAIDs while on this. You may take tylenol  in addition to this if needed for extra pain relief. 07/26/24   Iola Lukes, FNP  ondansetron  (ZOFRAN -ODT) 8 MG disintegrating tablet DISSOLVE 1 TABLET IN MOUTH EVERY 8 HOURS AS NEEDED FOR NAUSEA FOR VOMITING 07/29/24   Cook, Jayce G, DO  sacubitril -valsartan  (ENTRESTO ) 24-26 MG Take 1 tablet by mouth 2 (two) times daily. 05/25/24   Mallipeddi, Vishnu P, MD  TRULICITY  0.75 MG/0.5ML SOAJ INJECT 0.75 MG INTO THE SKIN ONCE WEEKLY 09/21/24   Cook, Jayce G, DO  Vitamin D , Ergocalciferol , (DRISDOL ) 1.25 MG (50000 UNIT) CAPS capsule Take 1 capsule by mouth once a week 04/08/24   Rogers Hai, MD    Allergies: Flagyl   [metronidazole ], Lansoprazole, Pravastatin , and Spironolactone     Review of Systems  Constitutional:  Negative for chills and fever.  HENT:  Negative for ear pain and sore throat.   Eyes:  Negative for pain and visual disturbance.  Respiratory:  Positive for chest tightness. Negative for cough and shortness of breath.   Cardiovascular:  Negative for chest pain and palpitations.  Gastrointestinal:  Negative for abdominal pain and vomiting.  Genitourinary:  Negative for dysuria and hematuria.  Musculoskeletal:  Negative for arthralgias and back pain.  Skin:  Negative for color change and rash.  Neurological:  Negative for seizures and syncope.  All other systems reviewed and are negative.   Updated Vital Signs BP 136/74   Pulse 74   Temp 98.1 F (36.7 C) (Oral)   Resp 10   Ht 5' 4 (1.626 m)   Wt 102.1 kg   LMP 04/05/2016 Comment: spotting 11/2016 and 12/2016  SpO2 95%   BMI 38.62 kg/m   Physical Exam Vitals and nursing note reviewed.  Constitutional:      General: She is not in acute distress.    Appearance: Normal appearance. She is well-developed. She is not ill-appearing.  HENT:     Head: Normocephalic and atraumatic.  Eyes:     Conjunctiva/sclera: Conjunctivae normal.  Cardiovascular:     Rate and Rhythm: Normal rate and regular rhythm.     Heart sounds: No murmur heard. Pulmonary:     Effort: Pulmonary effort is normal. No respiratory distress.     Breath sounds: Normal breath sounds.  Abdominal:     Palpations: Abdomen is soft.     Tenderness: There is no abdominal tenderness.  Musculoskeletal:        General: No swelling.     Cervical back: Neck supple.  Skin:    General: Skin is warm and dry.     Capillary Refill: Capillary refill takes less than 2 seconds.  Neurological:     Mental Status: She is alert.  Psychiatric:        Mood and Affect: Mood normal.     (all labs ordered are listed, but only abnormal results are displayed) Labs Reviewed - No data  to display  EKG: EKG Interpretation Date/Time:  Friday September 25 2024 08:37:36 EST Ventricular Rate:  75 PR Interval:  162 QRS Duration:  125 QT Interval:  436 QTC Calculation: 487 R Axis:   57  Text Interpretation: Sinus rhythm Left bundle branch block Compared with prior EKG from 04/12/2024 Confirmed by Gennaro Bouchard ([id number]) on 09/25/2024 8:41:28 AM  Radiology: [facility] Chest 1 View Result Date: 09/25/2024 EXAM: 1 VIEW(S) XRAY OF THE CHEST 09/25/2024 08:54:46 AM COMPARISON: 03/25/2024 CLINICAL HISTORY: chest pain FINDINGS: LINES, TUBES AND DEVICES: Left-sided defibrillator is  unchanged. LUNGS AND PLEURA: No focal pulmonary opacity. No pulmonary edema. No pleural effusion. No pneumothorax. HEART AND MEDIASTINUM: No acute abnormality of the cardiac and mediastinal silhouettes. BONES AND SOFT TISSUES: No acute osseous abnormality. IMPRESSION: 1. No acute cardiopulmonary process. Electronically signed by: Lynwood Seip MD 09/25/2024 09:21 AM EST RP Workstation: [id number]     Procedures   Medications Ordered in the ED - No data to display                                  Medical Decision Making Cardiac monitor interpretation: Sinus rhythm, no ectopy  Patient here for possible aspiration.  EKG and chest x-ray within normal limits.  She also had normal vitals and oxygen saturation and overall appears well.  She was able to tolerate p.o. without any difficulty.  Advised Tylenol  and Motrin  as needed for pain or discomfort and close follow-up with primary care and GI.  She feels comfortable to plan to be discharged home.  Advise return for any new or worsening symptoms.  Problems Addressed: Dysphagia, unspecified type: acute illness or injury  Amount and/or Complexity of Data Reviewed External Data Reviewed: notes.    Details: Prior ED records reviewed and the patient seen by discharge 6/25 for cyst Radiology: ordered and independent interpretation performed. Decision-making details  documented in ED Course.    Details: Ordered and interpreted by me independently of radiology Chest x-ray: Shows no acute abnormality ECG/medicine tests: ordered and independent interpretation performed. Decision-making details documented in ED Course.    Details: Ordered and interpreted by me in the absence of cardiology and shows sinus rhythm, no STEMI or significant change when compared to prior  Risk OTC drugs. Prescription drug management.     Final diagnoses:  Dysphagia, unspecified type    ED Discharge Orders     None          Gennaro Duwaine CROME, DO 09/25/24 [phone]

## 2024-09-25 NOTE — Progress Notes (Signed)
 Subjective:    Patient ID: [name], female    DOB: January 31, 1966, 58 y.o.   MRN: [id number]  HPI  Pt is a 58 yr old female with hx of DM,- A1c 6.1;  Anxiety, R breast invasive ductal cancer of R breast in 2012 neuropathy in hands and feet; and arthritis and pain in legs. Also has morbid obesity- BMI 38.76; and pacemaker-  Also hx of lumbar surgery- been on Norco for years.  Grief reaction and depression over death of sister.     Here for f/u on chronic back pain   As well as questionable myelopathy  Has neurogenic bowel and bladder-   HAS to use a cane - cannot go without- and has to EITHER hold on to something or use cane.   RUE went numb stemming from compression in neck.   Couldn't move RUE at all initially- still sore- from pain AND weakness-   6 weeks ago- given steroids and something else- from urgent care- and then after 1 week, could move better.   And then R leg- went out-  went to 2 Ortho's-  Come to find out- U/S of R leg- had cyst behind R knee and R knee had large effusion and swelling-  Was tapped just took fluid out- but not sure if they did steroid injection- and this was 2 weeks ago- and it's re-accumulated- and hurts and swollen again.     Works on Monday- cannot walk by Monday- has to recuperate overnight- and next day can hardly walk- due to the pain.   Pain in back and legs.  Not due to weakness.   Works as Water Engineer.   Difficulty getting in shower or bath- afraid would fall- asking about Bath chair or tub transfer bench.    Went to ED this AM because wasn't able ot breathe- pill got stuck- and then hyperventilated-  Needs to have esophagus stretched again- wanted to make sure didn't aspirate.     Pain Inventory Average Pain 9 Pain Right Now 9 My pain is sharp and stabbing  In the last 24 hours, has pain interfered with the following? General activity 8 Relation with others 9 Enjoyment of life 10 What TIME of day is your pain at  its worst? morning , daytime, evening, and night Sleep (in general) Poor  Pain is worse with: walking, bending, standing, and some activites Pain improves with: rest, heat/ice, and medication Relief from Meds: 6  Family History  Problem Relation Age of Onset   Colon cancer Mother        mid-50s, died of metastatic disease   Cancer Mother        liver   Coronary artery disease Mother    Diabetes type I Mother    Peripheral vascular disease Mother        Carotid disease in her 102s   Coronary artery disease Father        CAD in his 67s   Diabetes Father    Diabetes type I Father    Hypertension Father    Bipolar disorder Sister    Coronary artery disease Brother        MI in his 15s   Hypertension Brother    Hypertension Maternal Grandmother    Hyperlipidemia Maternal Grandmother    Stroke Maternal Grandmother    Hypertension Maternal Grandfather    Diabetes Maternal Grandfather    Diabetes Paternal Grandmother    Heart disease Paternal Grandfather  Social History   Socioeconomic History   Marital status: Married    Spouse name: Not on file   Number of children: 3   Years of education: Not on file   Highest education level: Some college, no degree  Occupational History   Occupation: child care    Employer: [employer]  Tobacco Use   Smoking status: Never   Smokeless tobacco: Never  Vaping Use   Vaping status: Never Used  Substance and Sexual Activity   Alcohol use: No    Alcohol/week: 0.0 standard drinks of alcohol   Drug use: No   Sexual activity: Yes    Partners: Male    Birth control/protection: Post-menopausal  Other Topics Concern   Not on file  Social History Narrative   ** Merged History Encounter **       Social Drivers of Health   Financial Resource Strain: Medium Risk (07/19/2024)   Overall Financial Resource Strain (CARDIA)    Difficulty of Paying Living Expenses: Somewhat hard  Food Insecurity: Food Insecurity Present (07/19/2024)    Hunger Vital Sign    Worried About Running Out of Food in the Last Year: Sometimes true    Ran Out of Food in the Last Year: Sometimes true  Transportation Needs: No Transportation Needs (07/19/2024)   PRAPARE - Administrator, Civil Service (Medical): No    Lack of Transportation (Non-Medical): No  Physical Activity: Inactive (07/19/2024)   Exercise Vital Sign    Days of Exercise per Week: 0 days    Minutes of Exercise per Session: Not on file  Stress: Stress Concern Present (07/19/2024)   [employer] of Occupational Health - Occupational Stress Questionnaire    Feeling of Stress: To some extent  Social Connections: Moderately Isolated (07/19/2024)   Social Connection and Isolation Panel    Frequency of Communication with Friends and Family: More than three times a week    Frequency of Social Gatherings with Friends and Family: Once a week    Attends Religious Services: Never    Database Administrator or Organizations: No    Attends Engineer, Structural: Not on file    Marital Status: Married   Past Surgical History:  Procedure Laterality Date   back surg x2     BALLOON DILATION N/A 09/22/2021   Procedure: BALLOON DILATION;  Surgeon: Cindie Carlin POUR, DO;  [facility];  Service: Endoscopy;  Laterality: N/A;   BIOPSY  09/22/2021   Procedure: BIOPSY;  Surgeon: Cindie Carlin POUR, DO;  [facility];  Service: Endoscopy;;   BIOPSY  12/23/2023   Procedure: BIOPSY;  Surgeon: Cindie Carlin POUR, DO;  [facility];  Service: Endoscopy;;   BIV ICD INSERTION CRT-D N/A 04/23/2023   Procedure: BIV ICD INSERTION CRT-D;  Surgeon: Nancey Eulas BRAVO, MD;  Location: [facility]AS[facility] CV [facility];  Service: Cardiovascular;  Laterality: N/A;   BREAST LUMPECTOMY Right 2012   CHOLECYSTECTOMY     COLONOSCOPY  11/03/07   4-mm sessile polyp removed/small internal hemorrhoids/tubular adenoma, random colon bx negative for microscopic colitis   COLONOSCOPY WITH  PROPOFOL  N/A 09/17/2016   Grade 2 hemorrhoids, colonic diverticulosis, ascending colon polyp (sessile serrated adenoma), 5 year surveillance   COLONOSCOPY WITH PROPOFOL  N/A 09/22/2021   Procedure: COLONOSCOPY WITH PROPOFOL ;  Surgeon: Cindie Carlin POUR, DO;  [facility];  Service: Endoscopy;  Laterality: N/A;  7:30am   COLONOSCOPY, ESOPHAGOGASTRODUODENOSCOPY (EGD) AND ESOPHAGEAL DILATION  01/2012  mild gastritis s/p biopsy. Empiric dilation. Normal duodenum.    DILATATION & CURETTAGE/HYSTEROSCOPY WITH MYOSURE N/A 02/10/2015   Procedure: DILATATION & CURETTAGE/HYSTEROSCOPY WITH MYOSURE;  Surgeon: Bobie FORBES Cathlyn JAYSON Nikki, MD;  Location: [facility];  Service: Gynecology;  Laterality: N/A;   DILATATION & CURETTAGE/HYSTEROSCOPY WITH MYOSURE N/A 01/24/2024   Procedure: DILATATION & CURETTAGE/HYSTEROSCOPY WITH MYOSURE;  Surgeon: Dallie Vera GAILS, MD;  Location: [facility];  Service: Gynecology;  Laterality: N/A;  [facility]   ESOPHAGOGASTRODUODENOSCOPY (EGD) WITH PROPOFOL  N/A 01/11/2020   Normal esophagus, s/p dilation, normal stomach, normal duodenum.    ESOPHAGOGASTRODUODENOSCOPY (EGD) WITH PROPOFOL  N/A 09/22/2021   Procedure: ESOPHAGOGASTRODUODENOSCOPY (EGD) WITH PROPOFOL ;  Surgeon: Cindie Carlin POUR, DO;  [facility];  Service: Endoscopy;  Laterality: N/A;   ESOPHAGOGASTRODUODENOSCOPY (EGD) WITH PROPOFOL  N/A 12/23/2023   Procedure: ESOPHAGOGASTRODUODENOSCOPY (EGD) WITH PROPOFOL ;  Surgeon: Cindie Carlin POUR, DO;  [facility];  Service: Endoscopy;  Laterality: N/A;  11:15 am, asa 3, pt knows to arrive at 6:15   MALONEY DILATION N/A 01/11/2020   Procedure: AGAPITO DILATION;  Surgeon: Shaaron Lamar HERO, MD;  [facility];  Service: Endoscopy;  Laterality: N/A;   MM BREAST STEREO BX*L*R/S     rt.   POLYPECTOMY  09/17/2016   Procedure: POLYPECTOMY;  Surgeon: Lamar HERO Shaaron, MD;  [facility];  Service: Endoscopy;;  ascending colon   POLYPECTOMY  09/22/2021   Procedure:  POLYPECTOMY;  Surgeon: Cindie Carlin POUR, DO;  [facility];  Service: Endoscopy;;   POLYPECTOMY  12/23/2023   Procedure: POLYPECTOMY;  Surgeon: Cindie Carlin POUR, DO;  [facility];  Service: Endoscopy;;   PORT-A-CATH REMOVAL  05/26/2012   Procedure: REMOVAL PORT-A-CATH;  Surgeon: Oneil DELENA Budge, MD;  Location: [facility];  Service: General;  Laterality: N/A;  Minor Room   PORTACATH PLACEMENT     Past Surgical History:  Procedure Laterality Date   back surg x2     BALLOON DILATION N/A 09/22/2021   Procedure: BALLOON DILATION;  Surgeon: Cindie Carlin POUR, DO;  [facility];  Service: Endoscopy;  Laterality: N/A;   BIOPSY  09/22/2021   Procedure: BIOPSY;  Surgeon: Cindie Carlin POUR, DO;  [facility];  Service: Endoscopy;;   BIOPSY  12/23/2023   Procedure: BIOPSY;  Surgeon: Cindie Carlin POUR, DO;  [facility];  Service: Endoscopy;;   BIV ICD INSERTION CRT-D N/A 04/23/2023   Procedure: BIV ICD INSERTION CRT-D;  Surgeon: Nancey Eulas FORBES, MD;  Location: [facility]VAS[facility] CV LAB;  Service: Cardiovascular;  Laterality: N/A;   BREAST LUMPECTOMY Right 2012   CHOLECYSTECTOMY     COLONOSCOPY  11/03/07   4-mm sessile polyp removed/small internal hemorrhoids/tubular adenoma, random colon bx negative for microscopic colitis   COLONOSCOPY WITH PROPOFOL  N/A 09/17/2016   Grade 2 hemorrhoids, colonic diverticulosis, ascending colon polyp (sessile serrated adenoma), 5 year surveillance   COLONOSCOPY WITH PROPOFOL  N/A 09/22/2021   Procedure: COLONOSCOPY WITH PROPOFOL ;  Surgeon: Cindie Carlin POUR, DO;  [facility];  Service: Endoscopy;  Laterality: N/A;  7:30am   COLONOSCOPY, ESOPHAGOGASTRODUODENOSCOPY (EGD) AND ESOPHAGEAL DILATION  01/2012   mild gastritis s/p biopsy. Empiric dilation. Normal duodenum.    DILATATION & CURETTAGE/HYSTEROSCOPY WITH MYOSURE N/A 02/10/2015   Procedure: DILATATION & CURETTAGE/HYSTEROSCOPY WITH MYOSURE;  Surgeon: Bobie FORBES Cathlyn JAYSON Nikki, MD;  Location: [facility];  Service: Gynecology;  Laterality: N/A;   DILATATION & CURETTAGE/HYSTEROSCOPY WITH MYOSURE N/A 01/24/2024   Procedure:  DILATATION & CURETTAGE/HYSTEROSCOPY WITH MYOSURE;  Surgeon: Dallie Vera GAILS, MD;  Location: [facility];  Service: Gynecology;  Laterality: N/A;  [facility]   ESOPHAGOGASTRODUODENOSCOPY (EGD) WITH PROPOFOL  N/A 01/11/2020   Normal esophagus, s/p dilation, normal stomach, normal duodenum.    ESOPHAGOGASTRODUODENOSCOPY (EGD) WITH PROPOFOL  N/A 09/22/2021   Procedure: ESOPHAGOGASTRODUODENOSCOPY (EGD) WITH PROPOFOL ;  Surgeon: Cindie Carlin POUR, DO;  [facility];  Service: Endoscopy;  Laterality: N/A;   ESOPHAGOGASTRODUODENOSCOPY (EGD) WITH PROPOFOL  N/A 12/23/2023   Procedure: ESOPHAGOGASTRODUODENOSCOPY (EGD) WITH PROPOFOL ;  Surgeon: Cindie Carlin POUR, DO;  [facility];  Service: Endoscopy;  Laterality: N/A;  11:15 am, asa 3, pt knows to arrive at 6:15   MALONEY DILATION N/A 01/11/2020   Procedure: AGAPITO DILATION;  Surgeon: Shaaron Lamar HERO, MD;  [facility];  Service: Endoscopy;  Laterality: N/A;   MM BREAST STEREO BX*L*R/S     rt.   POLYPECTOMY  09/17/2016   Procedure: POLYPECTOMY;  Surgeon: Lamar HERO Shaaron, MD;  [facility];  Service: Endoscopy;;  ascending colon   POLYPECTOMY  09/22/2021   Procedure: POLYPECTOMY;  Surgeon: Cindie Carlin POUR, DO;  [facility];  Service: Endoscopy;;   POLYPECTOMY  12/23/2023   Procedure: POLYPECTOMY;  Surgeon: Cindie Carlin POUR, DO;  [facility];  Service: Endoscopy;;   PORT-A-CATH REMOVAL  05/26/2012   Procedure: REMOVAL PORT-A-CATH;  Surgeon: Oneil DELENA Budge, MD;  Location: [facility];  Service: General;  Laterality: N/A;  Minor Room   PORTACATH PLACEMENT     Past Medical History:  Diagnosis Date   AICD (automatic cardioverter/defibrillator) present    Anxiety    Arthritis    Blood transfusion without reported diagnosis 1999   due to heavy menses   Breast cancer  (HCC) 05/22/2011   03/01/11, Stage 2, s/p lumpectomy, chemo/xrt   CHF (congestive heart failure) (HCC)    Complication of anesthesia    pt woke up during procedure in colonoscopy/endoscopy   DDD (degenerative disc disease), lumbar    Depression 06/22/2011   Diverticulitis    DM (diabetes mellitus) (HCC) 06/22/2011   Genital warts    GERD (gastroesophageal reflux disease) 06/22/2011   Hx of adenomatous colonic polyps 10/2007   4mm sigmoid tubular adenoma, FH colon cancer, mother in mid-50s   Hypercholesterolemia 06/22/2011   Hypertension 06/22/2011   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   Iron deficiency anemia 03/25/2015   LBBB (left bundle branch block)    Mild CAD    Morbid obesity (HCC)    NICM (nonischemic cardiomyopathy) (HCC)    Personal history of chemotherapy    Personal history of radiation therapy    Presence of permanent cardiac pacemaker    PSVT (paroxysmal supraventricular tachycardia)    Shortness of breath    STD (sexually transmitted disease)    HPV, 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   Syncope    Syncope    Vaginal bleeding 03/08/2014   BP (!) 168/85   Pulse 87   Ht 5' 4 (1.626 m)   Wt 225 lb 12.8 oz (102.4 kg)   LMP 04/05/2016 Comment: spotting 11/2016 and 12/2016  SpO2 94%   BMI 38.76 kg/m   Opioid Risk Score:   Fall Risk Score:  `1  Depression screen PHQ 2/9     08/04/2024    2:00 PM 07/21/2024    2:52 PM 06/04/2024    1:43 PM 04/03/2024    3:00 PM 01/02/2024    3:06  PM 11/14/2023    2:18 PM 09/06/2023    1:01 PM  Depression screen PHQ 2/9  Decreased Interest 2 2 1 3 2 1 1   Down, Depressed, Hopeless 2 2 1 2 2 1 1   PHQ - 2 Score 4 4 2 5 4 2 2   Altered sleeping 1 1  2 3     Tired, decreased energy 3 3  2 3     Change in appetite 1 1  0 2    Feeling bad or failure about yourself  1 1  3 2     Trouble concentrating 1 1  1 1     Moving slowly or fidgety/restless 1 1  0 0    Suicidal thoughts 0 0  0 0    PHQ-9 Score 12  12   13  15       Difficult doing work/chores Very difficult Very difficult  Very difficult Somewhat difficult       Data saved with a previous flowsheet row definition     Review of Systems  Musculoskeletal:  Positive for back pain, gait problem and neck pain.  Psychiatric/Behavioral:  Positive for dysphoric mood.   All other systems reviewed and are negative.      Objective:   Physical Exam  Awake, alert, appropriate, appears in more pain, NAD  In tears intermittently- due to depression MSK: Ue's- Deltoids 4/5; Biceps 4+/5; triceps 4+/5; WE 4+/5; grip 4-/5 and FA 4/5 LE's-  HF 4/5; KE4+/5; KF 4/5; DF 4-/5 and PF4-/5  Spasms still noted with DF/PF- tremor vs spasms No hoffman's no increased tone; no clonus, just these spasms.      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  Also hx of lumbar surgery- been on Norco for years.  Grief reaction and depression over death of sister.  Also has morbid obesity- BMI 38.76; and pacemaker- Also has seasonal affective disorder- and severe depression.    Here for f/u on chronic back pain  and questionable myelopathy with neurogenic bowel  and bladder incontinence   Will write Rx for [employer]- to get pt a rolling walker as well as tub transfer bench- she's 5 ft 4 inches tall- given to nursing to get it for pt.   2. Cervical radiculopathy and maybe cervical myelopathy- at C6/7- moderate spinal stenosis- with mild to moderate cord flattening and moderate L neural foraminal stenosis-  - pain and some weakness can occur due to the foraminal stenosis- at 3 different levels- R C4 nerve root is effaced and C4/5 moderate R foraminal stenosis-   went over these rsults with pt.   3.  Seasonal affective disorder- get a full spectrum light - in same room for 1+ hours/day.   4.  Ask them if they can try Pristiq- for  mood- because depression is worse- on Citalopram -  but could change to see if that could help- Pristiq or Trintellix???  5. D/w about her weight- is trying to get walking- by using RW- I think its a necessary part to everything. Sometimes it takes awhile   6.   Con't Norco 7.5/325 mg- 5x/day # 150-  Send in 2 rx's- - but see if you can reduce the usage to 4x/day-   7. Belbuca- will try 150 mcg 2x/day- a film that goes inside your  cheek- take scheduled- not as needed- for nerve and chronic pain- 1 week supply- then will get prior authorization- should take 1-2 days to really kick in.   8. Oral drug screen- - due to per clinic policy  9. Called Dr Penne Sharps about [facility]- neurosurgery referral for cervical myelopathy- esp at C6/7- moderate stenosis with cord flattening. [facility]  referral placed.   10. F/U on cervical myelopathy-  Double appt-    I spent a total of  55   minutes on total care today- >50% coordination of care- due to d/w pt about SAD, cervical myelopathy; chronic pain and meds changes; weight and walking-

## 2024-09-25 NOTE — Discharge Instructions (Addendum)
 It is important that you take small sips and bites of liquid/food. It is important that you call your primary care doctor and make a follow up appointment with them in 1-2 weeks. You should follow up with GI as well and discuss getting an endoscopy.

## 2024-09-25 NOTE — Patient Instructions (Signed)
 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  Also hx of lumbar surgery- been on Norco for years.  Grief reaction and depression over death of sister.  Also has morbid obesity- BMI 38.76; and pacemaker- Also has seasonal affective disorder- and severe depression.    Here for f/u on chronic back pain  and questionable myelopathy with neurogenic bowel  and bladder incontinence   Will write Rx for [employer]- to get pt a rolling walker as well as tub transfer bench- she's 5 ft 4 inches tall- given to nursing to get it for pt.   2. Cervical radiculopathy and maybe cervical myelopathy- at C6/7- moderate spinal stenosis- with mild to moderate cord flattening and moderate L neural foraminal stenosis-  - pain and some weakness can occur due to the foraminal stenosis- at 3 different levels- R C4 nerve root is effaced and C4/5 moderate R foraminal stenosis-   went over these rsults with pt.   3.  Seasonal affective disorder- get a full spectrum light - in same room for 1+ hours/day.   4.  Ask them if they can try Pristiq- for mood- because depression is worse- on Citalopram -  but could change to see if that could help- Pristiq or Trintellix???  5. D/w about her weight- is trying to get walking- by using RW- I think its a necessary part to everything. Sometimes it takes awhile   6.   Con't Norco 7.5/325 mg- 5x/day # 150-  Send in 2 rx's- - but see if you can reduce the usage to 4x/day-   7. Belbuca- will try 150 mcg 2x/day- a film that goes inside your cheek- take scheduled- not as needed- for nerve and chronic pain- 1 week supply- then will get prior authorization- should take 1-2 days to really kick in.   8. Oral drug screen- - due to per clinic policy  9. Called Dr Penne Sharps about [facility]- neurosurgery referral for cervical myelopathy- esp at C6/7- moderate stenosis with cord  flattening. [facility]  referral placed.   10. F/U on cervical myelopathy-

## 2024-09-25 NOTE — ED Triage Notes (Signed)
 Pt in by POV c/o of dull chest pain that started after she choked on some liquid this am.

## 2024-09-28 ENCOUNTER — Other Ambulatory Visit: Payer: Self-pay | Admitting: Physical Medicine and Rehabilitation

## 2024-09-28 ENCOUNTER — Telehealth: Payer: Self-pay | Admitting: *Deleted

## 2024-09-28 ENCOUNTER — Other Ambulatory Visit: Payer: Self-pay | Admitting: Gastroenterology

## 2024-09-28 MED ORDER — BELBUCA 150 MCG BU FILM: 150.0000 ug | ORAL_FILM | Freq: Two times a day (BID) | BUCCAL | 0 refills | Status: DC

## 2024-09-28 NOTE — Telephone Encounter (Signed)
 Belbuca comes in boxes of #60  and they do not break open boxes. Can you change rx to #60? It will also need a prior auth sent to insurance. When they get the new order they can send the PA information to us .

## 2024-09-29 ENCOUNTER — Telehealth: Payer: Self-pay

## 2024-09-29 DIAGNOSIS — G959 Disease of spinal cord, unspecified: Secondary | ICD-10-CM | POA: Diagnosis not present

## 2024-09-29 DIAGNOSIS — M5442 Lumbago with sciatica, left side: Secondary | ICD-10-CM | POA: Diagnosis not present

## 2024-09-29 DIAGNOSIS — G8929 Other chronic pain: Secondary | ICD-10-CM | POA: Diagnosis not present

## 2024-09-29 LAB — DRUG TOX MONITOR 1 W/CONF, ORAL FLD
Amphetamines: NEGATIVE ng/mL (ref <10)
Barbiturates: NEGATIVE ng/mL (ref <10)
Benzodiazepines: NEGATIVE ng/mL (ref <0.50)
Buprenorphine: NEGATIVE ng/mL (ref <0.10)
Cocaine: NEGATIVE ng/mL (ref <5.0)
Codeine: NEGATIVE ng/mL (ref <2.5)
Dihydrocodeine: 9.3 ng/mL — ABNORMAL HIGH (ref <2.5)
Fentanyl: NEGATIVE ng/mL (ref <0.10)
Heroin Metabolite: NEGATIVE ng/mL (ref <1.0)
Hydrocodone: 92.7 ng/mL — ABNORMAL HIGH (ref <2.5)
Hydromorphone: NEGATIVE ng/mL (ref <2.5)
MARIJUANA: NEGATIVE ng/mL (ref <2.5)
MDMA: NEGATIVE ng/mL (ref <10)
Meprobamate: NEGATIVE ng/mL (ref <2.5)
Methadone: NEGATIVE ng/mL (ref <5.0)
Morphine: NEGATIVE ng/mL (ref <2.5)
Nicotine Metabolite: NEGATIVE ng/mL (ref <5.0)
Norhydrocodone: 3.9 ng/mL — ABNORMAL HIGH (ref <2.5)
Noroxycodone: NEGATIVE ng/mL (ref <2.5)
Opiates: POSITIVE ng/mL — AB (ref <2.5)
Oxycodone: NEGATIVE ng/mL (ref <2.5)
Oxymorphone: NEGATIVE ng/mL (ref <2.5)
Phencyclidine: NEGATIVE ng/mL (ref <10)
Tapentadol: NEGATIVE ng/mL (ref <5.0)
Tramadol: NEGATIVE ng/mL (ref <5.0)
Zolpidem: NEGATIVE ng/mL (ref <5.0)

## 2024-09-29 LAB — DRUG TOX ALC METAB W/CON, ORAL FLD: Alcohol Metabolite: NEGATIVE ng/mL (ref <25)

## 2024-09-29 NOTE — Telephone Encounter (Signed)
 Patient called stating need PA on [place] film.

## 2024-09-29 NOTE — Telephone Encounter (Signed)
 PA done on Dexlansoprazole  60mg  DR Capsules on Cover My Meds. Dx used : K21.9, and R10.13. pt has tried and failed: Esomeprazole , nexium , Omeprazole , Pantoprazole , Famotidine . Waiting on a response.

## 2024-09-30 ENCOUNTER — Telehealth: Payer: Self-pay | Admitting: Gastroenterology

## 2024-09-30 ENCOUNTER — Other Ambulatory Visit: Payer: Self-pay | Admitting: Physical Medicine and Rehabilitation

## 2024-09-30 DIAGNOSIS — Z419 Encounter for procedure for purposes other than remedying health state, unspecified: Secondary | ICD-10-CM | POA: Diagnosis not present

## 2024-09-30 MED ORDER — BUPRENORPHINE 7.5 MCG/HR TD PTWK: 1.0000 | MEDICATED_PATCH | TRANSDERMAL | 0 refills | Status: DC

## 2024-09-30 NOTE — Telephone Encounter (Signed)
 Already taken care of / pt has been notified of Rx being approved.

## 2024-09-30 NOTE — Telephone Encounter (Signed)
 Pt approved for Dexlansoprazole  Cap DR 60 mg from 09-29-2024----09-29-2025. Pt is aware of this. Documentation scanned to chart

## 2024-09-30 NOTE — Telephone Encounter (Signed)
 Teliah calling from [facility] pharmacy services said Therisa wrote a prescription for dexilant  for patient that medicine needed prior authorization. On prior authorization notes they wrote dexlansoprazole  is what needs to be written for a new prescription in place of the dexilant .

## 2024-10-01 ENCOUNTER — Telehealth: Payer: Self-pay

## 2024-10-05 NOTE — Telephone Encounter (Signed)
 done

## 2024-10-05 NOTE — Telephone Encounter (Signed)
 Approved on November 13 by [facility] Medicaid 2017 Approved. This drug has been approved. Approved quantity: 4 patches per 28 day(s). You may fill up to a 34 day supply at a retail pharmacy. You may fill up to a 90 day supply for maintenance drugs, please refer to the formulary for details. Please call the pharmacy to process your prescription claim. Effective Date: 10/01/2024 Authorization Expiration Date: 12/30/2024

## 2024-10-14 NOTE — Progress Notes (Deleted)
 Referring Physician:  Lovorn, Megan, MD [phone] [address]. [address] [place],  [place] [address]  Primary Physician:  Cook, Jayce G, DO  History of Present Illness: 10/14/2024 [name] is here today with a chief complaint of *** Neck pain Any arm pain, numbness or weakness?    Duration: *** Location: *** Quality: *** Severity: ***  Precipitating: aggravated by *** Modifying factors: made better by *** Weakness: none Timing: *** Bowel/Bladder Dysfunction: Neurogenic bowel and bladder  Conservative measures:  Physical therapy: *** has not participated in Multimodal medical therapy including regular antiinflammatories: *** Tizanidine , hydrocodone , Prednisone , Baclofen , Naproxen  Injections: no epidural steroid injections  Past Surgery: ***none  [name] has ***no symptoms of cervical myelopathy.  The symptoms are causing a significant impact on the patient's life.   I have utilized the care everywhere function in epic to review the outside records available from external health systems.  Review of Systems:  A 10 point review of systems is negative, except for the pertinent positives and negatives detailed in the HPI.  Past Medical History: Past Medical History:  Diagnosis Date   AICD (automatic cardioverter/defibrillator) present    Anxiety    Arthritis    Blood transfusion without reported diagnosis 1999   due to heavy menses   Breast cancer (HCC) 05/22/2011   03/01/11, Stage 2, s/p lumpectomy, chemo/xrt   Cervical myelopathy (HCC)    CHF (congestive heart failure) (HCC)    Complication of anesthesia    pt woke up during procedure in colonoscopy/endoscopy   DDD (degenerative disc disease), lumbar    Depression 06/22/2011   Diverticulitis    DM (diabetes mellitus) (HCC) 06/22/2011   Genital warts    GERD (gastroesophageal reflux disease) 06/22/2011   Hx of adenomatous colonic polyps 10/2007   4mm sigmoid tubular adenoma, FH colon cancer, mother  in mid-50s   Hypercholesterolemia 06/22/2011   Hypertension 06/22/2011   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   Iron deficiency anemia 03/25/2015   LBBB (left bundle branch block)    Mild CAD    Morbid obesity (HCC)    NICM (nonischemic cardiomyopathy) (HCC)    Personal history of chemotherapy    Personal history of radiation therapy    Presence of permanent cardiac pacemaker    PSVT (paroxysmal supraventricular tachycardia)    Shortness of breath    STD (sexually transmitted disease)    HPV, 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   Syncope    Syncope    Vaginal bleeding 03/08/2014    Past Surgical History: Past Surgical History:  Procedure Laterality Date   back surg x2     BALLOON DILATION N/A 09/22/2021   Procedure: BALLOON DILATION;  Surgeon: Cindie Carlin POUR, DO;  [facility];  Service: Endoscopy;  Laterality: N/A;   BIOPSY  09/22/2021   Procedure: BIOPSY;  Surgeon: Cindie Carlin POUR, DO;  [facility];  Service: Endoscopy;;   BIOPSY  12/23/2023   Procedure: BIOPSY;  Surgeon: Cindie Carlin POUR, DO;  [facility];  Service: Endoscopy;;   BIV ICD INSERTION CRT-D N/A 04/23/2023   Procedure: BIV ICD INSERTION CRT-D;  Surgeon: Nancey Eulas BRAVO, MD;  [facility]: [facility];  Service: Cardiovascular;  Laterality: N/A;   BREAST LUMPECTOMY Right 2012   CHOLECYSTECTOMY     COLONOSCOPY  11/03/07   4-mm sessile polyp removed/small internal hemorrhoids/tubular adenoma, random colon bx negative for microscopic colitis   COLONOSCOPY WITH PROPOFOL  N/A  09/17/2016   Grade 2 hemorrhoids, colonic diverticulosis, ascending colon polyp (sessile serrated adenoma), 5 year surveillance   COLONOSCOPY WITH PROPOFOL  N/A 09/22/2021   Procedure: COLONOSCOPY WITH PROPOFOL ;  Surgeon: Cindie Carlin POUR, DO;  [facility];  Service: Endoscopy;  Laterality: N/A;  7:30am   COLONOSCOPY, ESOPHAGOGASTRODUODENOSCOPY (EGD) AND ESOPHAGEAL DILATION   01/2012   mild gastritis s/p biopsy. Empiric dilation. Normal duodenum.    DILATATION & CURETTAGE/HYSTEROSCOPY WITH MYOSURE N/A 02/10/2015   Procedure: DILATATION & CURETTAGE/HYSTEROSCOPY WITH MYOSURE;  Surgeon: Bobie FORBES Cathlyn JAYSON Nikki, MD;  Location: [facility];  Service: Gynecology;  Laterality: N/A;   DILATATION & CURETTAGE/HYSTEROSCOPY WITH MYOSURE N/A 01/24/2024   Procedure: DILATATION & CURETTAGE/HYSTEROSCOPY WITH MYOSURE;  Surgeon: Dallie Vera GAILS, MD;  Location: [facility];  Service: Gynecology;  Laterality: N/A;  [facility]   ESOPHAGOGASTRODUODENOSCOPY (EGD) WITH PROPOFOL  N/A 01/11/2020   Normal esophagus, s/p dilation, normal stomach, normal duodenum.    ESOPHAGOGASTRODUODENOSCOPY (EGD) WITH PROPOFOL  N/A 09/22/2021   Procedure: ESOPHAGOGASTRODUODENOSCOPY (EGD) WITH PROPOFOL ;  Surgeon: Cindie Carlin POUR, DO;  [facility];  Service: Endoscopy;  Laterality: N/A;   ESOPHAGOGASTRODUODENOSCOPY (EGD) WITH PROPOFOL  N/A 12/23/2023   Procedure: ESOPHAGOGASTRODUODENOSCOPY (EGD) WITH PROPOFOL ;  Surgeon: Cindie Carlin POUR, DO;  [facility];  Service: Endoscopy;  Laterality: N/A;  11:15 am, asa 3, pt knows to arrive at 6:15   MALONEY DILATION N/A 01/11/2020   Procedure: AGAPITO DILATION;  Surgeon: Shaaron Lamar HERO, MD;  [facility];  Service: Endoscopy;  Laterality: N/A;   MM BREAST STEREO BX*L*R/S     rt.   POLYPECTOMY  09/17/2016   Procedure: POLYPECTOMY;  Surgeon: Lamar HERO Shaaron, MD;  [facility];  Service: Endoscopy;;  ascending colon   POLYPECTOMY  09/22/2021   Procedure: POLYPECTOMY;  Surgeon: Cindie Carlin POUR, DO;  [facility];  Service: Endoscopy;;   POLYPECTOMY  12/23/2023   Procedure: POLYPECTOMY;  Surgeon: Cindie Carlin POUR, DO;  [facility];  Service: Endoscopy;;   PORT-A-CATH REMOVAL  05/26/2012   Procedure: REMOVAL PORT-A-CATH;  Surgeon: Oneil DELENA Budge, MD;  Location: [facility];  Service: General;  Laterality: N/A;  Minor Room   PORTACATH  PLACEMENT      Allergies: Allergies as of 10/28/2024 - Review Complete 09/25/2024  Allergen Reaction Noted   Flagyl  [metronidazole ] Hives, Itching, and Swelling 10/15/2017   Lansoprazole Other (See Comments) 12/26/2017   Pravastatin  Other (See Comments) 08/02/2015   Spironolactone  Other (See Comments) 04/15/2024    Medications:  Current Outpatient Medications:    atorvastatin  (LIPITOR) 40 MG tablet, Take 1 tablet (40 mg total) by mouth at bedtime., Disp: 90 tablet, Rfl: 3   blood glucose meter kit and supplies, Dispense based on patient and insurance preference. Use up to four times daily as directed. (FOR ICD-10 E10.9, E11.9)., Disp: 1 each, Rfl: 5   Blood Glucose Monitoring Suppl DEVI, 1 each by Does not apply route 2 (two) times daily. May substitute to any manufacturer covered by patient's insurance., Disp: 1 each, Rfl: 0   Blood Pressure KIT, 1 Units by Does not apply route daily., Disp: 1 kit, Rfl: 0   buprenorphine  (BUTRANS ) 7.5 MCG/HR, Place 1 patch onto the skin once a week. For chronic pain with Norco, Disp: 4 patch, Rfl: 0   Buprenorphine  HCl (BELBUCA ) 150 MCG FILM, Place 150 mcg inside cheek 2 (two) times daily. 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-,  Disp: 60 each, Rfl: 0   cetirizine  (ZYRTEC ) 10 MG tablet, Take 1 tablet (10 mg total) by mouth daily., Disp: 60 tablet, Rfl: 2   citalopram  (CELEXA ) 40 MG tablet, Take 1 tablet by mouth once daily, Disp: 90 tablet, Rfl: 0   clindamycin  (CLEOCIN ) 100 MG vaginal suppository, Place 1 suppository (100 mg total) vaginally at bedtime., Disp: 3 suppository, Rfl: 0   DEXILANT  60 MG capsule, Take 1 capsule by mouth once daily, Disp: 90 capsule, Rfl: 0   DULoxetine  (CYMBALTA ) 30 MG capsule, Take 1 capsule (30 mg total) by mouth daily., Disp: 90 capsule, Rfl: 1   empagliflozin  (JARDIANCE ) 10 MG TABS tablet, TAKE 1 TABLET BY MOUTH ONCE DAILY BEFORE BREAKFAST, Disp: 90 tablet, Rfl: 3    fluconazole  (DIFLUCAN ) 150 MG tablet, TAKE 1 TABLET BY MOUTH ONCE DAILY AS NEEDED FOR YEAST INFECTION, MAY REPEAT IN 3-4 DAYS IF NEEDED., Disp: 2 tablet, Rfl: 0   fluticasone  (FLONASE ) 50 MCG/ACT nasal spray, Place 2 sprays into both nostrils daily as needed for allergies or rhinitis. USE 2 SPRAY(S) IN EACH NOSTRIL ONCE DAILY AS NEEDED, Disp: 48 g, Rfl: 0   HYDROcodone -acetaminophen  (NORCO) 7.5-325 MG tablet, Take 1 tablet by mouth every 6 (six) hours as needed for moderate pain (pain score 4-6). 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 Take with food to avoid stomach upset. Do not take any additional NSAIDs while on this. You may take tylenol  in addition to this if needed for extra pain relief., Disp: 20 tablet, Rfl: 0   ondansetron  (ZOFRAN -ODT) 8 MG disintegrating tablet, DISSOLVE 1 TABLET IN MOUTH EVERY 8 HOURS AS NEEDED FOR NAUSEA FOR VOMITING, Disp: 20 tablet, Rfl: 3   sacubitril -valsartan  (ENTRESTO ) 24-26 MG, Take 1 tablet by mouth 2 (two) times daily., Disp: 180 tablet, Rfl: 2   TRULICITY  0.75 MG/0.5ML SOAJ, INJECT 0.75 MG INTO THE SKIN ONCE WEEKLY, Disp: 4 mL, Rfl: 0   Vitamin D , Ergocalciferol , (DRISDOL ) 1.25 MG (50000 UNIT) CAPS capsule, Take 1 capsule by mouth once a week, Disp:  36 capsule, Rfl: 0  Social History: Social History   Tobacco Use   Smoking status: Never   Smokeless tobacco: Never  Vaping Use   Vaping status: Never Used  Substance Use Topics   Alcohol use: No    Alcohol/week: 0.0 standard drinks of alcohol   Drug use: No    Family Medical History: Family History  Problem Relation Age of Onset   Colon cancer Mother        mid-50s, died of metastatic disease   Cancer Mother        liver   Coronary artery disease Mother    Diabetes type I Mother    Peripheral  vascular disease Mother        Carotid disease in her 78s   Coronary artery disease Father        CAD in his 47s   Diabetes Father    Diabetes type I Father    Hypertension Father    Bipolar disorder Sister    Coronary artery disease Brother        MI in his 78s   Hypertension Brother    Hypertension Maternal Grandmother    Hyperlipidemia Maternal Grandmother    Stroke Maternal Grandmother    Hypertension Maternal Grandfather    Diabetes Maternal Grandfather    Diabetes Paternal Grandmother    Heart disease Paternal Grandfather     Physical Examination: There were no vitals filed for this visit.  General: Patient is in no apparent distress. Attention to examination is appropriate.  Neck:   Supple.  Full range of motion.  Respiratory: Patient is breathing without any difficulty.   NEUROLOGICAL:     Awake, alert, oriented to person, place, and time.  Speech is clear and fluent.   Cranial Nerves: Pupils equal round and reactive to light.  Facial tone is symmetric.  Facial sensation is symmetric. Shoulder shrug is symmetric. Tongue protrusion is midline.    Strength: Side Biceps Triceps Deltoid Interossei Grip Wrist Ext. Wrist Flex.  R 5 5 5 5 5 5 5   L 5 5 5 5 5 5 5    Side Iliopsoas Quads Hamstring PF DF EHL  R 5 5 5 5 5 5   L 5 5 5 5 5 5    Reflexes are ***2+ and symmetric at the biceps, triceps, brachioradialis, patella and achilles.   Hoffman's is absent.  Clonus is absent  Bilateral upper and lower extremity sensation is intact to light touch ***.     No evidence of dysmetria noted.  Gait is normal.    Imaging: *** I have personally reviewed the images and agree with the above interpretation.  Medical Decision Making/Assessment and Plan: Ms. Ditmore is a pleasant 58 y.o. female with ***  There are no diagnoses linked to this encounter.   Thank you for involving me in the care of this patient.    Penne MICAEL Sharps MD/MSCR Neurosurgery

## 2024-10-21 ENCOUNTER — Ambulatory Visit: Attending: Cardiovascular Disease

## 2024-10-21 DIAGNOSIS — I428 Other cardiomyopathies: Secondary | ICD-10-CM

## 2024-10-22 ENCOUNTER — Ambulatory Visit: Admitting: Nurse Practitioner

## 2024-10-22 ENCOUNTER — Other Ambulatory Visit: Payer: Self-pay | Admitting: Family Medicine

## 2024-10-22 ENCOUNTER — Ambulatory Visit: Payer: Self-pay | Admitting: Cardiovascular Disease

## 2024-10-22 DIAGNOSIS — M7121 Synovial cyst of popliteal space [Baker], right knee: Secondary | ICD-10-CM | POA: Diagnosis not present

## 2024-10-22 DIAGNOSIS — E1165 Type 2 diabetes mellitus with hyperglycemia: Secondary | ICD-10-CM

## 2024-10-22 LAB — CUP PACEART REMOTE DEVICE CHECK
Battery Remaining Longevity: 115 mo
Battery Voltage: 3.01 V
Brady Statistic RV Percent Paced: 3.91 %
Date Time Interrogation Session: 20251202193117
HighPow Impedance: 68 Ohm
Implantable Lead Connection Status: 753985
Implantable Lead Connection Status: 753985
Implantable Lead Connection Status: 753985
Implantable Lead Implant Date: 20240604
Implantable Lead Implant Date: 20240604
Implantable Lead Implant Date: 20240604
Implantable Lead Location: 753858
Implantable Lead Location: 753859
Implantable Lead Location: 753860
Implantable Lead Model: 4598
Implantable Lead Model: 5076
Implantable Pulse Generator Implant Date: 20240604
Lead Channel Impedance Value: 1083 Ohm
Lead Channel Impedance Value: 1121 Ohm
Lead Channel Impedance Value: 342 Ohm
Lead Channel Impedance Value: 494 Ohm
Lead Channel Impedance Value: 513 Ohm
Lead Channel Impedance Value: 532 Ohm
Lead Channel Impedance Value: 608 Ohm
Lead Channel Impedance Value: 646 Ohm
Lead Channel Impedance Value: 665 Ohm
Lead Channel Impedance Value: 741 Ohm
Lead Channel Impedance Value: 817 Ohm
Lead Channel Impedance Value: 817 Ohm
Lead Channel Impedance Value: 950 Ohm
Lead Channel Pacing Threshold Amplitude: 0.75 V
Lead Channel Pacing Threshold Amplitude: 0.75 V
Lead Channel Pacing Threshold Amplitude: 1.375 V
Lead Channel Pacing Threshold Pulse Width: 0.4 ms
Lead Channel Pacing Threshold Pulse Width: 0.4 ms
Lead Channel Pacing Threshold Pulse Width: 0.4 ms
Lead Channel Sensing Intrinsic Amplitude: 1.5 mV
Lead Channel Sensing Intrinsic Amplitude: 8.1 mV
Lead Channel Setting Pacing Amplitude: 1.25 V
Lead Channel Setting Pacing Amplitude: 2 V
Lead Channel Setting Pacing Amplitude: 2 V
Lead Channel Setting Pacing Pulse Width: 0.4 ms
Lead Channel Setting Pacing Pulse Width: 0.4 ms
Lead Channel Setting Sensing Sensitivity: 0.3 mV
Zone Setting Status: 755011
Zone Setting Status: 755011
Zone Setting Status: 755011

## 2024-10-27 NOTE — Progress Notes (Signed)
 Remote ICD Transmission

## 2024-10-28 ENCOUNTER — Ambulatory Visit: Admitting: Neurosurgery

## 2024-10-28 ENCOUNTER — Other Ambulatory Visit: Payer: Self-pay | Admitting: Family Medicine

## 2024-10-29 ENCOUNTER — Telehealth: Payer: Self-pay | Admitting: *Deleted

## 2024-10-29 ENCOUNTER — Other Ambulatory Visit: Payer: Self-pay | Admitting: Family Medicine

## 2024-10-29 ENCOUNTER — Ambulatory Visit: Attending: Cardiovascular Disease | Admitting: Cardiovascular Disease

## 2024-10-29 ENCOUNTER — Encounter: Payer: Self-pay | Admitting: Cardiovascular Disease

## 2024-10-29 VITALS — BP 129/83 | HR 74 | Resp 16 | Ht 64.0 in | Wt 222.0 lb

## 2024-10-29 DIAGNOSIS — I5022 Chronic systolic (congestive) heart failure: Secondary | ICD-10-CM | POA: Diagnosis present

## 2024-10-29 DIAGNOSIS — I5020 Unspecified systolic (congestive) heart failure: Secondary | ICD-10-CM | POA: Insufficient documentation

## 2024-10-29 DIAGNOSIS — J302 Other seasonal allergic rhinitis: Secondary | ICD-10-CM

## 2024-10-29 DIAGNOSIS — I428 Other cardiomyopathies: Secondary | ICD-10-CM | POA: Diagnosis not present

## 2024-10-29 MED ORDER — HYDROCODONE-ACETAMINOPHEN 7.5-325 MG PO TABS: 1.0000 | ORAL_TABLET | Freq: Four times a day (QID) | ORAL | 0 refills | Status: DC | PRN

## 2024-10-29 MED ORDER — FUROSEMIDE 40 MG PO TABS: 40.0000 mg | ORAL_TABLET | ORAL | 0 refills | Status: DC | PRN

## 2024-10-29 MED ORDER — BUPRENORPHINE 7.5 MCG/HR TD PTWK: 1.0000 | MEDICATED_PATCH | TRANSDERMAL | 0 refills | Status: DC

## 2024-10-29 NOTE — Progress Notes (Signed)
 Electrophysiology Office Note:    Date:  10/29/2024   ID:  [name], DOB [birth year], MRN [id number]  PCP:  Bluford Jacqulyn MATSU, DO   [facility] Providers Cardiologist:  Diannah SHAUNNA Maywood, MD Electrophysiologist:  Eulas FORBES Furbish, MD     Referring MD: Cook, Jayce G, DO   History of Present Illness:    [name] is a 58 y.o. female with a hx listed below, significant for left bundle branch block, nonischemic cardiomyopathy, history of breast cancer s/p lumpectomy cancer referred for arrhythmia management.  Her echocardiogram performed in August 2023 showed depressed EF, 30 to 35%.  GDMT has been titrated up since that time.  She has been on Entresto , metoprolol  XL, and Jardiance  since October 2023 at the latest.  Cardiac MRI in April 2024 showed EF less than 35%.  Definitive cause for her cardiomyopathy has not been identified.  CT coronary showed mild nonobstructive coronary disease.  She has a left bundle branch block with QRS greater than 150 ms.  She has chronic shortness of breath and fatigue.  She is making efforts to lose weight but is limited in her ability to walk.  She can barely make it to her mailbox before becoming short of breath.  She underwent placement of a Medtronic BiV ICD on April 23, 2023.  Echocardiograms a few months afterwards showed some improvement in the EF, to 40 to 45%.  She has noted increased swelling, sense of abdominal fullness recently.  OptiVol today shows fluid accumulation.      EKGs/Labs/Other Studies Reviewed Today:    Echocardiogram:  07/10/22 TTE EF 30-35%   Monitors:  08/10/2022 Sinus rhythm HR 56-146, avg 98. 9 episodes of SVT occurred, longest 45 seconds. Overall burden of ectopy was < 1% There were approximately 35 pages of patient-triggered events showing sinus rhythm and occasional ectopy.  Stress testing:   Advanced imaging:  CMR 02/26/2023 Mild LVE with global hypokinesis, EF 34%. No delayed gadolinium  uptake.  Estimated cardiac output 4.7 L/min.  Coronary CT 08/01/2022 1. Coronary calcium  score of 141. This was 42 percentile for age-, sex, and race-matched controls. 2. Normal coronary origin with right dominance. 3. Mild CAD as outlined above. 4. Aortic atherosclerosis   EKG:  Last EKG results: 02/28/23 - sinus rhythm LBBB   Recent Labs: 03/25/2024: Magnesium  2.0 07/09/2024: ALT 20; BUN 7; Creatinine, Ser 0.77; Hemoglobin 14.1; Platelets 203; Potassium 3.6; Sodium 141     Physical Exam:    VS:  BP 129/83 (BP Location: Left Arm, Patient Position: Sitting, Cuff Size: Large)   Pulse 74   Resp 16   Ht 5' 4 (1.626 m)   Wt 222 lb (100.7 kg)   LMP 04/05/2016 Comment: spotting 11/2016 and 12/2016  SpO2 94%   BMI 38.11 kg/m     Wt Readings from Last 3 Encounters:  10/29/24 222 lb (100.7 kg)  09/25/24 225 lb 12.8 oz (102.4 kg)  09/25/24 225 lb (102.1 kg)     GEN:  Well nourished, well developed in no acute distress, obese CARDIAC: RRR, no murmurs, rubs, gallops RESPIRATORY:  Normal work of breathing MUSCULOSKELETAL: no edema    ASSESSMENT & PLAN:    Non-ischemic cardiomyopathy Etiology unclear EF remains less than 35% despite greater than 3 months of GDMT CRT-D in place EF improved to 45% after CRT placement With elevated OptiVol and symptoms of fluid retention.  Will start furosemide  40 mg daily for a short course.   Will hold Jardiance   due to yeast infections  she will follow-up with general cardiology   LBBB Possibly causing cardiomyopathy QRS greater than 150 ms Will address with a CRT  CHFrEF NYHA II-III Continue Entresto  24-26, Jardiance  10 mg, metoprolol  XL 50 mg  PSVT Appears to be subclinical Will monitor with the device            Medication Adjustments/Labs and Tests Ordered: Current medicines are reviewed at length with the patient today.  Concerns regarding medicines are outlined above.  Orders Placed This Encounter  Procedures   EKG  12-Lead   No orders of the defined types were placed in this encounter.    Signed, Eulas FORBES Furbish, MD  10/29/2024 3:34 PM    [facility]

## 2024-10-29 NOTE — Patient Instructions (Addendum)
 Medication Instructions:  Your physician has recommended you make the following change in your medication:   ** Stop Jardiance   ** Begin Furosemide  40mg  - 1 tablet by mouth daily as needed for swelling  *If you need a refill on your cardiac medications before your next appointment, please call your pharmacy*  Lab Work: None ordered.  If you have labs (blood work) drawn today and your tests are completely normal, you will receive your results only by: MyChart Message (if you have MyChart) OR A paper copy in the mail If you have any lab test that is abnormal or we need to change your treatment, we will call you to review the results.  Testing/Procedures: None ordered.   Follow-Up: At [facility], you and your health needs are our priority.  As part of our continuing mission to provide you with exceptional heart care, our providers are all part of one team.  This team includes your primary Cardiologist (physician) and Advanced Practice Providers or APPs (Physician Assistants and Nurse Practitioners) who all work together to provide you with the care you need, when you need it.  Your next appointment:  Please schedule appointment with general cardiology APP   6 month(s)  Provider:   You will see one of the following Advanced Practice Providers on your designated Care Team:   Charlies Arthur, [place] Ozell Jodie Passey, PA-C Suzann Riddle, NP Daphne Barrack, NP Artist Pouch, PA-C     We recommend signing up for the patient portal called MyChart.  Sign up information is provided on this After Visit Summary.  MyChart is used to connect with patients for Virtual Visits (Telemedicine).  Patients are able to view lab/test results, encounter notes, upcoming appointments, etc.  Non-urgent messages can be sent to your provider as well.   To learn more about what you can do with MyChart, go to [email or web address].

## 2024-10-29 NOTE — Telephone Encounter (Signed)
 [name] called for a refill on her hydrocodone  and butrans  patches.

## 2024-10-30 ENCOUNTER — Telehealth: Payer: Self-pay

## 2024-10-30 ENCOUNTER — Other Ambulatory Visit: Payer: Self-pay | Admitting: Family Medicine

## 2024-10-30 DIAGNOSIS — J302 Other seasonal allergic rhinitis: Secondary | ICD-10-CM

## 2024-10-30 NOTE — Telephone Encounter (Signed)
 Rec'd PA request for Butrans  (Buprenorphine  7.5 mcg/HR Weekly patches.  PA submitted in November for this medication.  Approval is valid from 10/01/24 - 12/30/24.  Confirmed with pharmacy that medication is and has been approved from insurance.

## 2024-11-05 ENCOUNTER — Encounter: Admitting: Registered Nurse

## 2024-11-10 ENCOUNTER — Ambulatory Visit: Admitting: Family Medicine

## 2024-11-10 ENCOUNTER — Encounter: Payer: Self-pay | Admitting: Nurse Practitioner

## 2024-11-10 ENCOUNTER — Ambulatory Visit: Attending: Nurse Practitioner | Admitting: Nurse Practitioner

## 2024-11-10 ENCOUNTER — Ambulatory Visit: Admitting: Nurse Practitioner

## 2024-11-10 VITALS — BP 122/78 | HR 86 | Ht 64.0 in | Wt 217.6 lb

## 2024-11-10 VITALS — BP 134/79 | HR 83 | Temp 97.3°F | Ht 64.0 in | Wt 216.5 lb

## 2024-11-10 DIAGNOSIS — I428 Other cardiomyopathies: Secondary | ICD-10-CM | POA: Insufficient documentation

## 2024-11-10 DIAGNOSIS — E782 Mixed hyperlipidemia: Secondary | ICD-10-CM

## 2024-11-10 DIAGNOSIS — I5020 Unspecified systolic (congestive) heart failure: Secondary | ICD-10-CM

## 2024-11-10 DIAGNOSIS — E785 Hyperlipidemia, unspecified: Secondary | ICD-10-CM | POA: Diagnosis not present

## 2024-11-10 DIAGNOSIS — I2510 Atherosclerotic heart disease of native coronary artery without angina pectoris: Secondary | ICD-10-CM | POA: Diagnosis not present

## 2024-11-10 DIAGNOSIS — E669 Obesity, unspecified: Secondary | ICD-10-CM | POA: Diagnosis not present

## 2024-11-10 DIAGNOSIS — Z95810 Presence of automatic (implantable) cardiac defibrillator: Secondary | ICD-10-CM | POA: Insufficient documentation

## 2024-11-10 DIAGNOSIS — R350 Frequency of micturition: Secondary | ICD-10-CM | POA: Diagnosis not present

## 2024-11-10 DIAGNOSIS — E118 Type 2 diabetes mellitus with unspecified complications: Secondary | ICD-10-CM | POA: Diagnosis not present

## 2024-11-10 DIAGNOSIS — I10 Essential (primary) hypertension: Secondary | ICD-10-CM | POA: Diagnosis not present

## 2024-11-10 DIAGNOSIS — Z7984 Long term (current) use of oral hypoglycemic drugs: Secondary | ICD-10-CM

## 2024-11-10 MED ORDER — FUROSEMIDE 20 MG PO TABS: 20.0000 mg | ORAL_TABLET | Freq: Every day | ORAL | 5 refills | Status: AC | PRN

## 2024-11-10 NOTE — Patient Instructions (Addendum)
 Medication Instructions:  Your physician has recommended you make the following change in your medication:  Discontinue Jardiance  Decrease Lasix  from 40 mg to 20 mg daily as needed for leg swelling, short of breath and weight gain Continue taking all other medications as prescribed   Labwork: Fasting Lipid Panel today at Primary Care Physician visit-[facility]  Testing/Procedures: None  Follow-Up: Your physician recommends that you schedule a follow-up appointment in: 3 months  Any Other Special Instructions Will Be Listed Below (If Applicable). Thank you for choosing [facility]!     If you need a refill on your cardiac medications before your next appointment, please call your pharmacy.

## 2024-11-10 NOTE — Progress Notes (Unsigned)
 " Cardiology Office Note   Date: 11/10/2024 ID:  [name], [name] 1966-06-24, MRN [id number] PCP: Bluford Jacqulyn MATSU, DO  [facility] Providers Cardiologist:  Diannah SHAUNNA Maywood, MD Electrophysiologist:  Eulas FORBES Furbish, MD { Click to update primary MD,subspecialty MD or APP then REFRESH:1}    History of Present Illness [name] is a 58 y.o. female with a PMH of nonischemic cardiomyopathy, mild CAD, hyperlipidemia, history of breast cancer, s/p lumpectomy and chemo/radiation, who presents today for overdue 52-month follow-up appointment.  Received defibrillator device and June 2024.  Last seen by Dr. Mallipeddi on March 12, 2024.  She was doing well at the time.  For her severe fatigue, she was referred to pulmonology for OSA evaluation.  Today she presents for overdue 48-month follow-up appointment.  She states she is doing well.  However, she has some questions/concerns about 2 of her medications.  She wants to know if she can stop Jardiance  as she has noticed this has caused yeast infections for her.  Also wants to know if she can reduce Lasix  dosage as she says taking the 40 mg daily dose caused her to have extreme weakness after taking it.  She will be seeing her PCP's office today for labs. Denies any chest pain, shortness of breath, palpitations, syncope, presyncope, dizziness, orthopnea, PND, swelling or significant weight changes, acute bleeding, or claudication.  ROS: Negative.  See HPI.  Studies Reviewed  EKG: EKG is not ordered.  Echo 10/2023: 1. Left ventricular ejection fraction, by estimation, is 40 to 45%. The  left ventricle has mildly decreased function. The left ventricle  demonstrates global hypokinesis. Left ventricular diastolic function could  not be evaluated.   2. Right ventricular systolic function is normal. The right ventricular  size is normal. There is normal pulmonary artery systolic pressure.   3. The mitral valve was not well visualized.  Trivial mitral valve  regurgitation. No evidence of mitral stenosis.   4. The aortic valve was not well visualized. Aortic valve regurgitation  is not visualized. No aortic stenosis is present.   5. The inferior vena cava is normal in size with greater than 50%  respiratory variability, suggesting right atrial pressure of 3 mmHg.   Comparison(s): Changes from prior study are noted.   Cardiac MRI/2024: IMPRESSION: 1. Mild LVE with global hypokinesis and abnormal septal motion LVEF 34%   2.  No delayed gadolinium uptake on inversion recovery sequences   3.  Low normal RVEF 46%   4.  Trivial appearing AR/MR   5.  Normal parametric measures see above   6.  Estimated cardiac output 4.7 L/min   Cardiac monitor 07/2022:   Normal sinus rhythm avg HR 98 bpm   Atrial tachycardia - benign   Rare PAC/PVC   No atrial fibrillation   Symptoms triggered were associiated with sinus rhythm/mild tachycardia - benign     Patch Wear Time:  13 days and 21 hours (2023-08-28T20:33:30-0400 to 2023-09-11T17:37:39-0400)   Patient had a min HR of 56 bpm, max HR of 182 bpm, and avg HR of 98 bpm. Predominant underlying rhythm was Sinus Rhythm. Bundle Branch Block/IVCD was present. 9 Supraventricular Tachycardia runs occurred, the run with the fastest interval lasting 18  beats with a max rate of 182 bpm, the longest lasting 45.0 secs with an avg rate of 108 bpm. Isolated SVEs were rare (<1.0%), SVE Couplets were rare (<1.0%), and SVE Triplets were rare (<1.0%). Isolated VEs were rare (<1.0%, 889), VE Couplets  were rare  (<1.0%, 4), and VE Triplets were rare (<1.0%, 1).  Coronary CTA 07/2022: IMPRESSION: 1. Coronary calcium  score of 141. This was 16 percentile for age-, sex, and race-matched controls.   2. Normal coronary origin with right dominance.   3. Mild CAD as outlined above.   4. Aortic atherosclerosis   RECOMMENDATIONS: CAD-RADS 2: Mild non-obstructive CAD (25-49%).  Consider non-atherosclerotic causes of chest pain. Consider preventive therapy and risk factor modification.  Lexiscan  01/2017: No diagnostic ST segment changes to indicate ischemia. Small, mild intensity, mid to apical anteroseptal defect that is partially reversible. Suspect variable breast attenuation artifact rather than a small ischemic territory. This is a low risk study. Nuclear stress EF: 55%.  Physical Exam VS:  BP 122/78 (BP Location: Left Arm)   Pulse 86   Ht 5' 4 (1.626 m)   Wt 217 lb 9.6 oz (98.7 kg)   LMP 04/05/2016 Comment: spotting 11/2016 and 12/2016  SpO2 95%   BMI 37.35 kg/m        Wt Readings from Last 3 Encounters:  11/10/24 217 lb 9.6 oz (98.7 kg)  10/29/24 222 lb (100.7 kg)  09/25/24 225 lb 12.8 oz (102.4 kg)    GEN: Obese, 58 y.o. female in no acute distress NECK: No JVD; No carotid bruits CARDIAC: S1/S2, RRR, no murmurs, rubs, gallops RESPIRATORY:  Clear to auscultation without rales, wheezing or rhonchi  ABDOMEN: Soft, non-tender, non-distended EXTREMITIES:  No edema; No deformity   ASSESSMENT AND PLAN ***    {Are you ordering a CV Procedure (e.g. stress test, cath, DCCV, TEE, etc)?   Press F2        :[id number]}    Stopping Jardiance  as this causing yeast infections for this patient.  Reducing Lasix  as she said taking the 40 mg daily as needed was causing extreme weakness after taking it.  Will reduce to 20 mg daily as needed.  Otherwise, she is doing very well without complaint today.  She is going to see her PCP and will be getting labs.  Will add a fasting lipid profile to this.  Follow-up with me in 3 months for GDMT titration.      Dispo: ***  Signed, Almarie Crate, NP   "

## 2024-11-10 NOTE — Progress Notes (Deleted)
 So weak, could barely get out of chair.... with lasix ...    Stop Jardiance . Avoid SGLT2i.    Lower lasix  dosage to 20 mg daily PRN.    Scheduled next month to be tested for sleep apnea.    Fasting lipid profile. Dr. Bluford.    3 months. F/u.

## 2024-11-10 NOTE — Patient Instructions (Signed)
 Labs today.  Awaiting urine studies.  Follow up in 3-6 months.

## 2024-11-11 LAB — CUP PACEART INCLINIC DEVICE CHECK
Battery Remaining Longevity: 115 mo
Battery Voltage: 3.01 V
Brady Statistic AP VP Percent: 0.03 %
Brady Statistic AP VS Percent: 0.01 %
Brady Statistic AS VP Percent: 98.5 %
Brady Statistic AS VS Percent: 1.46 %
Brady Statistic RA Percent Paced: 0.04 %
Brady Statistic RV Percent Paced: 5.73 %
Date Time Interrogation Session: 20251211153600
HighPow Impedance: 68 Ohm
Implantable Lead Connection Status: 753985
Implantable Lead Connection Status: 753985
Implantable Lead Connection Status: 753985
Implantable Lead Implant Date: 20240604
Implantable Lead Implant Date: 20240604
Implantable Lead Implant Date: 20240604
Implantable Lead Location: 753858
Implantable Lead Location: 753859
Implantable Lead Location: 753860
Implantable Lead Model: 4598
Implantable Lead Model: 5076
Implantable Pulse Generator Implant Date: 20240604
Lead Channel Impedance Value: 1121 Ohm
Lead Channel Impedance Value: 1159 Ohm
Lead Channel Impedance Value: 342 Ohm
Lead Channel Impedance Value: 513 Ohm
Lead Channel Impedance Value: 532 Ohm
Lead Channel Impedance Value: 551 Ohm
Lead Channel Impedance Value: 608 Ohm
Lead Channel Impedance Value: 665 Ohm
Lead Channel Impedance Value: 665 Ohm
Lead Channel Impedance Value: 741 Ohm
Lead Channel Impedance Value: 817 Ohm
Lead Channel Impedance Value: 817 Ohm
Lead Channel Impedance Value: 969 Ohm
Lead Channel Pacing Threshold Amplitude: 0.75 V
Lead Channel Pacing Threshold Amplitude: 0.75 V
Lead Channel Pacing Threshold Amplitude: 1.5 V
Lead Channel Pacing Threshold Pulse Width: 0.4 ms
Lead Channel Pacing Threshold Pulse Width: 0.4 ms
Lead Channel Pacing Threshold Pulse Width: 0.4 ms
Lead Channel Sensing Intrinsic Amplitude: 1.8 mV
Lead Channel Sensing Intrinsic Amplitude: 8.4 mV
Lead Channel Setting Pacing Amplitude: 1.25 V
Lead Channel Setting Pacing Amplitude: 2 V
Lead Channel Setting Pacing Amplitude: 2.25 V
Lead Channel Setting Pacing Pulse Width: 0.4 ms
Lead Channel Setting Pacing Pulse Width: 0.4 ms
Lead Channel Setting Sensing Sensitivity: 0.3 mV
Zone Setting Status: 755011
Zone Setting Status: 755011
Zone Setting Status: 755011

## 2024-11-11 LAB — HEMOGLOBIN A1C
Est. average glucose Bld gHb Est-mCnc: 120 mg/dL
Hgb A1c MFr Bld: 5.8 % — ABNORMAL HIGH (ref 4.8–5.6)

## 2024-11-11 LAB — LIPID PANEL
Chol/HDL Ratio: 3.8 ratio (ref 0.0–4.4)
Cholesterol, Total: 135 mg/dL (ref 100–199)
HDL: 36 mg/dL — ABNORMAL LOW
LDL Chol Calc (NIH): 69 mg/dL (ref 0–99)
Triglycerides: 180 mg/dL — ABNORMAL HIGH (ref 0–149)
VLDL Cholesterol Cal: 30 mg/dL (ref 5–40)

## 2024-11-13 ENCOUNTER — Ambulatory Visit: Payer: Self-pay | Admitting: Family Medicine

## 2024-11-14 ENCOUNTER — Other Ambulatory Visit: Payer: Self-pay | Admitting: Nurse Practitioner

## 2024-11-14 ENCOUNTER — Other Ambulatory Visit: Payer: Self-pay | Admitting: Family Medicine

## 2024-11-14 ENCOUNTER — Other Ambulatory Visit: Payer: Self-pay | Admitting: Physical Medicine and Rehabilitation

## 2024-11-14 DIAGNOSIS — E1165 Type 2 diabetes mellitus with hyperglycemia: Secondary | ICD-10-CM

## 2024-11-14 LAB — URINE CULTURE

## 2024-11-14 LAB — SPECIMEN STATUS REPORT

## 2024-11-14 MED ORDER — SULFAMETHOXAZOLE-TRIMETHOPRIM 400-80 MG PO TABS: 1.0000 | ORAL_TABLET | Freq: Two times a day (BID) | ORAL | 0 refills | Status: DC

## 2024-11-16 ENCOUNTER — Ambulatory Visit: Payer: Self-pay | Admitting: Cardiovascular Disease

## 2024-11-16 DIAGNOSIS — R350 Frequency of micturition: Secondary | ICD-10-CM | POA: Insufficient documentation

## 2024-11-16 NOTE — Assessment & Plan Note (Signed)
 Urine culture was positive.  Rx sent for Bactrim .

## 2024-11-16 NOTE — Assessment & Plan Note (Signed)
 Stable.  Continue close follow-up with cardiology.  Continue current medications.

## 2024-11-16 NOTE — Assessment & Plan Note (Signed)
 Stable.  Continue current medications.

## 2024-11-16 NOTE — Assessment & Plan Note (Signed)
At goal. Continue Lipitor. 

## 2024-11-16 NOTE — Assessment & Plan Note (Signed)
 A1c returned at goal.  Continue current medications.

## 2024-11-16 NOTE — Progress Notes (Signed)
 "  Subjective:  Patient ID: [name], female    DOB: Oct 31, 1966  Age: 58 y.o. MRN: [id number]  CC:   Chief Complaint  Patient presents with   Follow-up    Patient is here for a follow up visit. States she feels like she is still having UTI symptoms with cloudy urine    HPI:  58 year old female with an extensive medical history as seen below presents for follow up.  Has nonobstructive coronary artery disease and HFrEF.  Follows closely with cardiology.  ICD in place.  Jardiance  has been discontinued due to recurrent yeast infections.  BP fairly well-controlled, metoprolol , Entresto .  Needs updated A1c.  Compliant with metformin  and Trulicity .  As mentioned above Jardiance  has been discontinued.  LDL at goal on Lipitor.  Ongoing urinary frequency.  Has cloudy urine.  Concern for UTI.  Patient Active Problem List   Diagnosis Date Noted   Frequent urination 11/16/2024   HFrEF (heart failure with reduced ejection fraction) (HCC) 11/10/2024   DM (diabetes mellitus), type 2 with complications (HCC) 11/10/2024   Chronic bilateral low back pain with bilateral sciatica 09/25/2024   Cervical myelopathy (HCC) 09/25/2024   Chemotherapy-induced neuropathy 07/16/2024   Vitamin D  deficiency 07/16/2024   Postmenopausal bleeding 11/28/2023   CAD (coronary artery disease) 08/29/2022   Aortic atherosclerosis 08/29/2022   Chronic pain syndrome 05/01/2022   LBBB (left bundle branch block) 05/01/2022   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   Fatty liver 09/06/2016   FH: colon cancer 01/24/2012   GERD (gastroesophageal reflux disease) 06/22/2011   Hyperlipidemia 06/22/2011   Essential hypertension 06/22/2011   Obesity 06/22/2011   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    Social Hx   Social History   Socioeconomic History   Marital status: Married    Spouse name: Not on file   Number of children: 3   Years of  education: Not on file   Highest education level: Some college, no degree  Occupational History   Occupation: child care    Employer: [employer]  Tobacco Use   Smoking status: Never   Smokeless tobacco: Never  Vaping Use   Vaping status: Never Used  Substance and Sexual Activity   Alcohol use: No    Alcohol/week: 0.0 standard drinks of alcohol   Drug use: No   Sexual activity: Yes    Partners: Male    Birth control/protection: Post-menopausal  Other Topics Concern   Not on file  Social History Narrative   ** Merged History Encounter **       Social Drivers of Health   Tobacco Use: Low Risk (11/10/2024)   Patient History    Smoking Tobacco Use: Never    Smokeless Tobacco Use: Never    Passive Exposure: Not on file  Financial Resource Strain: High Risk (11/06/2024)   Overall Financial Resource Strain (CARDIA)    Difficulty of Paying Living Expenses: Very hard  Food Insecurity: Food Insecurity Present (11/06/2024)   Epic    Worried About Programme Researcher, Broadcasting/film/video in the Last Year: Often true    Barista in the Last Year: Often true  Transportation Needs: No Transportation Needs (11/06/2024)   Epic    Lack of Transportation (Medical): No    Lack of Transportation (Non-Medical): No  Physical Activity: Inactive (11/06/2024)   Exercise Vital Sign    Days of Exercise per Week: 0 days    Minutes  of Exercise per Session: Not on file  Stress: Stress Concern Present (11/06/2024)   [employer] of Occupational Health - Occupational Stress Questionnaire    Feeling of Stress: Rather much  Social Connections: Moderately Isolated (11/06/2024)   Social Connection and Isolation Panel    Frequency of Communication with Friends and Family: More than three times a week    Frequency of Social Gatherings with Friends and Family: Once a week    Attends Religious Services: Never    Database Administrator or Organizations: No    Attends Engineer, Structural: Not on  file    Marital Status: Married  Depression (PHQ2-9): High Risk (08/04/2024)   Depression (PHQ2-9)    PHQ-2 Score: 12  Alcohol Screen: Not on file  Housing: Low Risk (11/06/2024)   Epic    Unable to Pay for Housing in the Last Year: No    Number of Times Moved in the Last Year: 0    Homeless in the Last Year: No  Utilities: Not on file  Health Literacy: Not on file    Review of Systems Per HPI  Objective:  BP 134/79 (BP Location: Left Arm, Patient Position: Sitting)   Pulse 83   Temp (!) 97.3 F (36.3 C)   Ht 5' 4 (1.626 m)   Wt 216 lb 8 oz (98.2 kg)   LMP 04/05/2016 Comment: spotting 11/2016 and 12/2016  SpO2 97%   BMI 37.16 kg/m      11/10/2024    3:12 PM 11/10/2024    7:59 AM 10/29/2024    3:19 PM  BP/Weight  Systolic BP 134 122 129  Diastolic BP 79 78 83  Wt. (Lbs) 216.5 217.6 222  BMI 37.16 kg/m2 37.35 kg/m2 38.11 kg/m2    Physical Exam Vitals and nursing note reviewed.  Constitutional:      General: She is not in acute distress.    Appearance: Normal appearance. She is obese.  HENT:     Head: Normocephalic and atraumatic.  Eyes:     General:        Right eye: No discharge.        Left eye: No discharge.     Conjunctiva/sclera: Conjunctivae normal.  Cardiovascular:     Rate and Rhythm: Normal rate and regular rhythm.  Pulmonary:     Effort: Pulmonary effort is normal.     Breath sounds: Normal breath sounds. No wheezing, rhonchi or rales.  Neurological:     Mental Status: She is alert.  Psychiatric:        Mood and Affect: Mood normal.        Behavior: Behavior normal.     Lab Results  Component Value Date   WBC 5.3 07/09/2024   HGB 14.1 07/09/2024   HCT 44.6 07/09/2024   PLT 203 07/09/2024   GLUCOSE 92 07/21/2024   CHOL 135 11/10/2024   TRIG 180 (H) 11/10/2024   HDL 36 (L) 11/10/2024   LDLCALC 69 11/10/2024   ALT 20 07/09/2024   AST 21 07/09/2024   NA 141 07/09/2024   K 3.6 07/09/2024   CL 104 07/09/2024   CREATININE 0.77  07/09/2024   BUN 7 07/09/2024   CO2 25 07/09/2024   TSH 3.540 07/18/2023   HGBA1C 5.8 (H) 11/10/2024   MICROALBUR 1.2 10/20/2018     Assessment & Plan:  DM (diabetes mellitus), type 2 with complications (HCC) Assessment & Plan: A1c returned at goal.  Continue current medications.  Orders: -  Hemoglobin A1c  HFrEF (heart failure with reduced ejection fraction) (HCC) Assessment & Plan: Stable.  Continue close follow-up with cardiology.  Continue current medications.   Frequent urination Assessment & Plan: Urine culture was positive.  Rx sent for Bactrim .  Orders: -     Urinalysis -     Urine Culture  Essential hypertension Assessment & Plan: Stable.  Continue current medications.   Mixed hyperlipidemia Assessment & Plan: At goal.  Continue Lipitor.   Other orders -     Specimen status report    Follow-up:  3-6 months  Starkeisha Vanwinkle Bluford DO [facility] "

## 2024-11-22 ENCOUNTER — Ambulatory Visit: Payer: Self-pay

## 2024-11-23 ENCOUNTER — Encounter: Admitting: Registered Nurse

## 2024-11-23 ENCOUNTER — Ambulatory Visit: Payer: Self-pay

## 2024-11-23 NOTE — Telephone Encounter (Signed)
 Attempted to contact pt after dropped call, no answer, LVM for call back to PCP office. Placed in call back.

## 2024-11-23 NOTE — Telephone Encounter (Signed)
 Mutual greeting after transfer then call dropped. Nurse restarting system then will call pt back if not talking with other triage nurse.   Copied from CRM [id number]. Topic: Clinical - Red Word Triage >> Nov 23, 2024  8:35 AM Chasity T wrote: Red Word that prompted transfer to Nurse Triage: patient is have flu symptoms , states she is having some SOB but not occurring now.  Please call back: [phone]

## 2024-11-23 NOTE — Telephone Encounter (Signed)
 FYI Only or Action Required?: FYI only for provider: appointment scheduled on 11/24/24.  Patient was last seen in primary care on 11/10/2024 by Cook, Jayce G, DO.  Called Nurse Triage reporting Shortness of Breath.  Symptoms began several days ago.  Interventions attempted: Nothing.  Symptoms are: gradually improving.  Triage Disposition: See Physician Within 24 Hours  Patient/caregiver understands and will follow disposition?: Yes    Thursday onset of feeling feverish with sore throat, runny nose, chest congestion, moderate unproductive cough and moderate SOB only with coughing. Unsure what temp reached, just felt hot. 6/10 left ear pain starting Thursday. No SOB currently. Onset of left sided dull 5-6/10 CP on Thursday. Only when coughing, resolves when coughing stops. Does not radiate. Denies new indigestion or leg swelling. Scheduled appt with different provider at home office for tomorrow per pt request. Advised UC or ED for worsening symptoms.     Copied from CRM [id number]. Topic: Clinical - Red Word Triage >> Nov 23, 2024  8:35 AM Chasity T wrote: Red Word that prompted transfer to Nurse Triage: patient is have flu symptoms , states she is having some SOB but not occurring now.   Please call back: [phone] Reason for Disposition  Earache is present  Answer Assessment - Initial Assessment Questions 1. ONSET: When did the cough begin?      Thursday  2. SEVERITY: How bad is the cough today?      Moderate  3. SPUTUM: Describe the color of your sputum (e.g., none, dry cough; clear, white, yellow, green)     Unproductive  4. HEMOPTYSIS: Are you coughing up any blood? If Yes, ask: How much? (e.g., flecks, streaks, tablespoons, etc.)     Denies  5. DIFFICULTY BREATHING: Are you having difficulty breathing? If Yes, ask: How bad is it? (e.g., mild, moderate, severe)      None at rest, moderate SOB only with coughing  6. FEVER: Do you have a fever? If Yes,  ask: What is your temperature, how was it measured, and when did it start?     Felt feverish, did not check temp  7. CARDIAC HISTORY: Do you have any history of heart disease? (e.g., heart attack, congestive heart failure)      Has a PM and defibrillator. Hx of htn, heart failure, CAD, LBBB, and aortic atherosclerosis.  8. LUNG HISTORY: Do you have any history of lung disease?  (e.g., pulmonary embolus, asthma, emphysema)     Denies  9. PE RISK FACTORS: Do you have a history of blood clots? (or: recent major surgery, recent prolonged travel, bedridden)     Denies  10. OTHER SYMPTOMS: Do you have any other symptoms? (e.g., runny nose, wheezing, chest pain)       Onset of left sided dull 5-6/10 CP on Thursday. Only when coughing, resolves when coughing stops. Does not radiate. Sore throat, runny nose, chest congestion  12. TRAVEL: Have you traveled out of the country in the last month? (e.g., travel history, exposures)       Denies  Protocols used: Cough - Acute Non-Productive-A-AH

## 2024-11-24 ENCOUNTER — Encounter: Payer: Self-pay | Admitting: Nurse Practitioner

## 2024-11-24 ENCOUNTER — Encounter: Admitting: Registered Nurse

## 2024-11-24 ENCOUNTER — Ambulatory Visit: Admitting: Nurse Practitioner

## 2024-11-24 VITALS — BP 107/67 | HR 76 | Temp 95.9°F | Ht 64.0 in | Wt 211.4 lb

## 2024-11-24 DIAGNOSIS — R058 Other specified cough: Secondary | ICD-10-CM

## 2024-11-24 MED ORDER — PROMETHAZINE-DM 6.25-15 MG/5ML PO SYRP: 5.0000 mL | ORAL_SOLUTION | Freq: Four times a day (QID) | ORAL | 0 refills | Status: DC | PRN

## 2024-11-24 NOTE — Progress Notes (Signed)
 "  Subjective:    Patient ID: [name], female    DOB: 1966/07/27, 59 y.o.   MRN: [id number]  HPI Discussed the use of AI scribe software for clinical note transcription with the patient, who gave verbal consent to proceed.  History of Present Illness [name] is a 59 year old female with heart failure who presents with upper respiratory symptoms.  Her symptoms began approximately a week ago, with the worst night being Friday. She experiences a cough, rhinorrhea, and production of light yellow sputum, which is causing nausea. No vomiting, but she has been taking nausea medication frequently, which provides some relief. She also reports a low-grade fever that peaked on Friday night, causing her to feel flushed and hot, but the fever has since resolved.  She experiences left otalgia and a sensation of drainage in her ear, accompanied by a sore throat. No sinus headache or pressure, but her throat, coughing, and ear discomfort are the most bothersome symptoms. She has not taken any over-the-counter medications like Mucinex  or Robitussin for these symptoms.  She has a history of heart failure and is currently on Entresto . She reports dark yellow urine and mentions a recent weight loss, which she attributes to her current illness.  She has experienced some chest pain localized to a specific area and reports occasional dyspnea and a history of bronchitis. She is currently on Bactrim  for a UTI, which she started on December 27th and has a few days left to complete. She was previously on Jardiance , which was discontinued due to recurrent UTIs.  She uses Flonase  nasal spray regularly and is diabetic, which influences her treatment options. She is employed in home health care.  Social History[1]      Objective:   Physical Exam Vitals and nursing note reviewed.  Constitutional:      General: She is not in acute distress. HENT:     Ears:     Comments: TMs clear effusion, no  erythema.    Mouth/Throat:     Mouth: Mucous membranes are moist.     Pharynx: Oropharynx is clear.  Neck:     Comments: Mild anterior cervical adenopathy.  Cardiovascular:     Rate and Rhythm: Normal rate and regular rhythm.  Pulmonary:     Effort: Pulmonary effort is normal.     Breath sounds: Normal breath sounds.  Musculoskeletal:     Cervical back: Neck supple.     Right lower leg: No edema.     Left lower leg: No edema.  Lymphadenopathy:     Cervical: Cervical adenopathy present.  Neurological:     Mental Status: She is alert and oriented to person, place, and time.  Psychiatric:        Mood and Affect: Mood normal.        Behavior: Behavior normal.        Thought Content: Thought content normal.    Today's Vitals   11/24/24 1518  BP: 107/67  Pulse: 76  Temp: (!) 95.9 F (35.5 C)  SpO2: 99%  Weight: 211 lb 6 oz (95.9 kg)  Height: 5' 4 (1.626 m)   Body mass index is 36.28 kg/m.         Assessment & Plan:  1. Post-viral cough syndrome (Primary) - Finish current course of Bactrim . - Use promethazine  DM cough syrup for nighttime cough. - Continue Flonase  nasal spray. - Monitor for fever, increased dyspnea, severe chest pain, or inability to retain fluids. - Increase  fluid intake to prevent dehydration. - promethazine -dextromethorphan (PROMETHAZINE -DM) 6.25-15 MG/5ML syrup; Take 5 mLs by mouth 4 (four) times daily as needed. Drowsiness precautions.  Dispense: 118 mL; Refill: 0  - Increase fluid intake to maintain blood pressure. - Monitor blood pressure regularly. - Consult cardiology if significant hypotension occurs due to HF treatment.   Return if symptoms worsen or fail to improve.          [1]  Social History Tobacco Use   Smoking status: Never   Smokeless tobacco: Never  Vaping Use   Vaping status: Never Used  Substance Use Topics   Alcohol use: No    Alcohol/week: 0.0 standard drinks of alcohol   Drug use: No   "

## 2024-11-26 ENCOUNTER — Ambulatory Visit: Admitting: Pulmonary Disease

## 2024-11-26 ENCOUNTER — Encounter: Admitting: Registered Nurse

## 2024-11-27 ENCOUNTER — Encounter: Payer: Self-pay | Admitting: Nurse Practitioner

## 2024-11-28 ENCOUNTER — Ambulatory Visit: Payer: Self-pay | Admitting: Nurse Practitioner

## 2024-12-01 ENCOUNTER — Encounter: Admitting: Registered Nurse

## 2024-12-03 ENCOUNTER — Other Ambulatory Visit: Payer: Self-pay | Admitting: Family Medicine

## 2024-12-03 ENCOUNTER — Encounter: Payer: Self-pay | Admitting: Registered Nurse

## 2024-12-03 ENCOUNTER — Encounter: Attending: Physical Medicine and Rehabilitation | Admitting: Registered Nurse

## 2024-12-03 ENCOUNTER — Other Ambulatory Visit: Payer: Self-pay | Admitting: Physician Assistant

## 2024-12-03 ENCOUNTER — Other Ambulatory Visit: Payer: Self-pay | Admitting: Internal Medicine

## 2024-12-03 VITALS — BP 126/84 | HR 85 | Ht 64.0 in | Wt 215.8 lb

## 2024-12-03 DIAGNOSIS — M25561 Pain in right knee: Secondary | ICD-10-CM | POA: Insufficient documentation

## 2024-12-03 DIAGNOSIS — M542 Cervicalgia: Secondary | ICD-10-CM | POA: Insufficient documentation

## 2024-12-03 DIAGNOSIS — M25562 Pain in left knee: Secondary | ICD-10-CM | POA: Insufficient documentation

## 2024-12-03 DIAGNOSIS — G8929 Other chronic pain: Secondary | ICD-10-CM | POA: Insufficient documentation

## 2024-12-03 DIAGNOSIS — F324 Major depressive disorder, single episode, in partial remission: Secondary | ICD-10-CM

## 2024-12-03 DIAGNOSIS — G894 Chronic pain syndrome: Secondary | ICD-10-CM | POA: Diagnosis not present

## 2024-12-03 DIAGNOSIS — Z79891 Long term (current) use of opiate analgesic: Secondary | ICD-10-CM | POA: Insufficient documentation

## 2024-12-03 DIAGNOSIS — Z5181 Encounter for therapeutic drug level monitoring: Secondary | ICD-10-CM | POA: Insufficient documentation

## 2024-12-03 DIAGNOSIS — M5416 Radiculopathy, lumbar region: Secondary | ICD-10-CM | POA: Diagnosis not present

## 2024-12-03 MED ORDER — BUPRENORPHINE 7.5 MCG/HR TD PTWK: 1.0000 | MEDICATED_PATCH | TRANSDERMAL | 2 refills | Status: AC

## 2024-12-03 MED ORDER — HYDROCODONE-ACETAMINOPHEN 7.5-325 MG PO TABS: 1.0000 | ORAL_TABLET | Freq: Every day | ORAL | 0 refills | Status: DC | PRN

## 2024-12-03 MED ORDER — HYDROCODONE-ACETAMINOPHEN 7.5-325 MG PO TABS: 1.0000 | ORAL_TABLET | Freq: Every day | ORAL | 0 refills | Status: AC | PRN

## 2024-12-03 MED ORDER — BUPRENORPHINE 7.5 MCG/HR TD PTWK: 1.0000 | MEDICATED_PATCH | TRANSDERMAL | 0 refills | Status: DC

## 2024-12-03 NOTE — Progress Notes (Signed)
 "  Subjective:    Patient ID: [name], female    DOB: 03-08-1966, 59 y.o.   MRN: [id number]  HPI: [name] is a 59 y.o. female who returns for follow up appointment for chronic pain and medication refill. She states her  pain is located in her neck, lower back radiating into her left lower extremity and bilateral knee pain. She rates her pain 8. Her current exercise regime is walking and performing stretching exercises.  Ms. [place] Morphine  equivalent is 30,00 MME. She reports she is receiving 4 hours of relief with her current Hydrocodone  dosage.   Last Oral Swab was Performed on 09/27/2025, it was consistent.   Pain Inventory Average Pain 7 Pain Right Now 8 My pain is sharp, stabbing, and aching  In the last 24 hours, has pain interfered with the following? General activity 8 Relation with others 7 Enjoyment of life 9 What TIME of day is your pain at its worst? morning , daytime, evening, and night Sleep (in general) Poor  Pain is worse with: walking, bending, sitting, inactivity, standing, and some activites Pain improves with: rest, heat/ice, and medication Relief from Meds: 7  Family History  Problem Relation Age of Onset   Colon cancer Mother        mid-50s, died of metastatic disease   Cancer Mother        liver   Coronary artery disease Mother    Diabetes type I Mother    Peripheral vascular disease Mother        Carotid disease in her 70s   Coronary artery disease Father        CAD in his 45s   Diabetes Father    Diabetes type I Father    Hypertension Father    Bipolar disorder Sister    Coronary artery disease Brother        MI in his 21s   Hypertension Brother    Hypertension Maternal Grandmother    Hyperlipidemia Maternal Grandmother    Stroke Maternal Grandmother    Hypertension Maternal Grandfather    Diabetes Maternal Grandfather    Diabetes Paternal Grandmother    Heart disease Paternal Grandfather    Social History   Socioeconomic  History   Marital status: Married    Spouse name: Not on file   Number of children: 3   Years of education: Not on file   Highest education level: Some college, no degree  Occupational History   Occupation: child care    Employer: [employer]  Tobacco Use   Smoking status: Never   Smokeless tobacco: Never  Vaping Use   Vaping status: Never Used  Substance and Sexual Activity   Alcohol use: No    Alcohol/week: 0.0 standard drinks of alcohol   Drug use: No   Sexual activity: Yes    Partners: Male    Birth control/protection: Post-menopausal  Other Topics Concern   Not on file  Social History Narrative   ** Merged History Encounter **       Social Drivers of Health   Tobacco Use: Low Risk (12/03/2024)   Patient History    Smoking Tobacco Use: Never    Smokeless Tobacco Use: Never    Passive Exposure: Not on file  Financial Resource Strain: High Risk (11/06/2024)   Overall Financial Resource Strain (CARDIA)    Difficulty of Paying Living Expenses: Very hard  Food Insecurity: Food Insecurity Present (11/06/2024)   Epic    Worried About  Running Out of Food in the Last Year: Often true    Ran Out of Food in the Last Year: Often true  Transportation Needs: No Transportation Needs (11/06/2024)   Epic    Lack of Transportation (Medical): No    Lack of Transportation (Non-Medical): No  Physical Activity: Inactive (11/06/2024)   Exercise Vital Sign    Days of Exercise per Week: 0 days    Minutes of Exercise per Session: Not on file  Stress: Stress Concern Present (11/06/2024)   [employer] of Occupational Health - Occupational Stress Questionnaire    Feeling of Stress: Rather much  Social Connections: Moderately Isolated (11/06/2024)   Social Connection and Isolation Panel    Frequency of Communication with Friends and Family: More than three times a week    Frequency of Social Gatherings with Friends and Family: Once a week    Attends Religious Services:  Never    Database Administrator or Organizations: No    Attends Engineer, Structural: Not on file    Marital Status: Married  Depression (PHQ2-9): High Risk (11/24/2024)   Depression (PHQ2-9)    PHQ-2 Score: 11  Alcohol Screen: Not on file  Housing: Low Risk (11/06/2024)   Epic    Unable to Pay for Housing in the Last Year: No    Number of Times Moved in the Last Year: 0    Homeless in the Last Year: No  Utilities: Not on file  Health Literacy: Not on file   Past Surgical History:  Procedure Laterality Date   back surg x2     BALLOON DILATION N/A 09/22/2021   Procedure: BALLOON DILATION;  Surgeon: Cindie Carlin POUR, DO;  [facility];  Service: Endoscopy;  Laterality: N/A;   BIOPSY  09/22/2021   Procedure: BIOPSY;  Surgeon: Cindie Carlin POUR, DO;  [facility];  Service: Endoscopy;;   BIOPSY  12/23/2023   Procedure: BIOPSY;  Surgeon: Cindie Carlin POUR, DO;  [facility];  Service: Endoscopy;;   BIV ICD INSERTION CRT-D N/A 04/23/2023   Procedure: BIV ICD INSERTION CRT-D;  Surgeon: Nancey Eulas BRAVO, MD;  Location[facility] INVASIVE CV LAB;  Service: Cardiovascular;  Laterality: N/A;   BREAST LUMPECTOMY Right 2012   CHOLECYSTECTOMY     COLONOSCOPY  11/03/07   4-mm sessile polyp removed/small internal hemorrhoids/tubular adenoma, random colon bx negative for microscopic colitis   COLONOSCOPY WITH PROPOFOL  N/A 09/17/2016   Grade 2 hemorrhoids, colonic diverticulosis, ascending colon polyp (sessile serrated adenoma), 5 year surveillance   COLONOSCOPY WITH PROPOFOL  N/A 09/22/2021   Procedure: COLONOSCOPY WITH PROPOFOL ;  Surgeon: Cindie Carlin POUR, DO;  [facility];  Service: Endoscopy;  Laterality: N/A;  7:30am   COLONOSCOPY, ESOPHAGOGASTRODUODENOSCOPY (EGD) AND ESOPHAGEAL DILATION  01/2012   mild gastritis s/p biopsy. Empiric dilation. Normal duodenum.    DILATATION & CURETTAGE/HYSTEROSCOPY WITH MYOSURE N/A 02/10/2015   Procedure: DILATATION &  CURETTAGE/HYSTEROSCOPY WITH MYOSURE;  Surgeon: Bobie BRAVO Cathlyn JAYSON Nikki, MD;  Location: [facility];  Service: Gynecology;  Laterality: N/A;   DILATATION & CURETTAGE/HYSTEROSCOPY WITH MYOSURE N/A 01/24/2024   Procedure: DILATATION & CURETTAGE/HYSTEROSCOPY WITH MYOSURE;  Surgeon: Dallie Vera GAILS, MD;  Location: [facility];  Service: Gynecology;  Laterality: N/A;  [facility]   ESOPHAGOGASTRODUODENOSCOPY (EGD) WITH PROPOFOL  N/A 01/11/2020   Normal esophagus, s/p dilation, normal stomach, normal duodenum.    ESOPHAGOGASTRODUODENOSCOPY (EGD) WITH PROPOFOL  N/A 09/22/2021   Procedure: ESOPHAGOGASTRODUODENOSCOPY (EGD) WITH PROPOFOL ;  Surgeon: Cindie Carlin POUR, DO;  [facility];  Service: Endoscopy;  Laterality: N/A;   ESOPHAGOGASTRODUODENOSCOPY (EGD) WITH PROPOFOL  N/A 12/23/2023   Procedure: ESOPHAGOGASTRODUODENOSCOPY (EGD) WITH PROPOFOL ;  Surgeon: Cindie Carlin POUR, DO;  [facility];  Service: Endoscopy;  Laterality: N/A;  11:15 am, asa 3, pt knows to arrive at 6:15   MALONEY DILATION N/A 01/11/2020   Procedure: AGAPITO DILATION;  Surgeon: Shaaron Lamar HERO, MD;  [facility];  Service: Endoscopy;  Laterality: N/A;   MM BREAST STEREO BX*L*R/S     rt.   POLYPECTOMY  09/17/2016   Procedure: POLYPECTOMY;  Surgeon: Lamar HERO Shaaron, MD;  [facility];  Service: Endoscopy;;  ascending colon   POLYPECTOMY  09/22/2021   Procedure: POLYPECTOMY;  Surgeon: Cindie Carlin POUR, DO;  [facility];  Service: Endoscopy;;   POLYPECTOMY  12/23/2023   Procedure: POLYPECTOMY;  Surgeon: Cindie Carlin POUR, DO;  [facility];  Service: Endoscopy;;   PORT-A-CATH REMOVAL  05/26/2012   Procedure: REMOVAL PORT-A-CATH;  Surgeon: Oneil DELENA Budge, MD;  Location: [facility];  Service: General;  Laterality: N/A;  Minor Room   PORTACATH PLACEMENT     Past Surgical History:  Procedure Laterality Date   back surg x2     BALLOON DILATION N/A 09/22/2021   Procedure: BALLOON DILATION;  Surgeon: Cindie Carlin POUR, DO;  [facility];  Service: Endoscopy;  Laterality: N/A;   BIOPSY  09/22/2021   Procedure: BIOPSY;  Surgeon: Cindie Carlin POUR, DO;  [facility];  Service: Endoscopy;;   BIOPSY  12/23/2023   Procedure: BIOPSY;  Surgeon: Cindie Carlin POUR, DO;  [facility];  Service: Endoscopy;;   BIV ICD INSERTION CRT-D N/A 04/23/2023   Procedure: BIV ICD INSERTION CRT-D;  Surgeon: Nancey Eulas BRAVO, MD;  Location: [facility] INVASIVE CV LAB;  Service: Cardiovascular;  Laterality: N/A;   BREAST LUMPECTOMY Right 2012   CHOLECYSTECTOMY     COLONOSCOPY  11/03/07   4-mm sessile polyp removed/small internal hemorrhoids/tubular adenoma, random colon bx negative for microscopic colitis   COLONOSCOPY WITH PROPOFOL  N/A 09/17/2016   Grade 2 hemorrhoids, colonic diverticulosis, ascending colon polyp (sessile serrated adenoma), 5 year surveillance   COLONOSCOPY WITH PROPOFOL  N/A 09/22/2021   Procedure: COLONOSCOPY WITH PROPOFOL ;  Surgeon: Cindie Carlin POUR, DO;  [facility];  Service: Endoscopy;  Laterality: N/A;  7:30am   COLONOSCOPY, ESOPHAGOGASTRODUODENOSCOPY (EGD) AND ESOPHAGEAL DILATION  01/2012   mild gastritis s/p biopsy. Empiric dilation. Normal duodenum.    DILATATION & CURETTAGE/HYSTEROSCOPY WITH MYOSURE N/A 02/10/2015   Procedure: DILATATION & CURETTAGE/HYSTEROSCOPY WITH MYOSURE;  Surgeon: Bobie BRAVO Cathlyn JAYSON Nikki, MD;  Location: [facility];  Service: Gynecology;  Laterality: N/A;   DILATATION & CURETTAGE/HYSTEROSCOPY WITH MYOSURE N/A 01/24/2024   Procedure: DILATATION & CURETTAGE/HYSTEROSCOPY WITH MYOSURE;  Surgeon: Dallie Vera GAILS, MD;  Location: [facility];  Service: Gynecology;  Laterality: N/A;  [facility]   ESOPHAGOGASTRODUODENOSCOPY (EGD) WITH PROPOFOL  N/A 01/11/2020   Normal esophagus, s/p dilation, normal stomach, normal duodenum.    ESOPHAGOGASTRODUODENOSCOPY (EGD) WITH PROPOFOL  N/A 09/22/2021   Procedure: ESOPHAGOGASTRODUODENOSCOPY (EGD) WITH PROPOFOL ;  Surgeon: Cindie Carlin POUR, DO;  [facility];  Service: Endoscopy;  Laterality: N/A;   ESOPHAGOGASTRODUODENOSCOPY (EGD) WITH PROPOFOL  N/A 12/23/2023   Procedure: ESOPHAGOGASTRODUODENOSCOPY (EGD) WITH PROPOFOL ;  Surgeon: Cindie Carlin POUR, DO;  [facility];  Service: Endoscopy;  Laterality: N/A;  11:15 am, asa 3, pt knows to arrive at 6:15   [facility]  DILATION N/A 01/11/2020   Procedure: AGAPITO DILATION;  Surgeon: Shaaron Lamar HERO, MD;  [facility];  Service: Endoscopy;  Laterality: N/A;   MM BREAST STEREO BX*L*R/S     rt.   POLYPECTOMY  09/17/2016   Procedure: POLYPECTOMY;  Surgeon: Lamar HERO Shaaron, MD;  [facility];  Service: Endoscopy;;  ascending colon   POLYPECTOMY  09/22/2021   Procedure: POLYPECTOMY;  Surgeon: Cindie Carlin POUR, DO;  [facility];  Service: Endoscopy;;   POLYPECTOMY  12/23/2023   Procedure: POLYPECTOMY;  Surgeon: Cindie Carlin POUR, DO;  [facility];  Service: Endoscopy;;   PORT-A-CATH REMOVAL  05/26/2012   Procedure: REMOVAL PORT-A-CATH;  Surgeon: Oneil DELENA Budge, MD;  Location: [facility];  Service: General;  Laterality: N/A;  Minor Room   PORTACATH PLACEMENT     Past Medical History:  Diagnosis Date   AICD (automatic cardioverter/defibrillator) present    Anxiety    Arthritis    Blood transfusion without reported diagnosis 1999   due to heavy menses   Breast cancer (HCC) 05/22/2011   03/01/11, Stage 2, s/p lumpectomy, chemo/xrt   Cervical myelopathy (HCC)    CHF (congestive heart failure) (HCC)    Complication of anesthesia    pt woke up during procedure in colonoscopy/endoscopy   DDD (degenerative disc disease), lumbar    Depression 06/22/2011   Diverticulitis    DM (diabetes mellitus) (HCC) 06/22/2011   Genital warts    GERD (gastroesophageal reflux disease) 06/22/2011   Hx of adenomatous colonic polyps 10/2007   4mm sigmoid tubular adenoma, FH colon cancer, mother in mid-50s   Hypercholesterolemia 06/22/2011    Hypertension 06/22/2011   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   Iron deficiency anemia 03/25/2015   LBBB (left bundle branch block)    Mild CAD    Morbid obesity (HCC)    NICM (nonischemic cardiomyopathy) (HCC)    Personal history of chemotherapy    Personal history of radiation therapy    Presence of permanent cardiac pacemaker    PSVT (paroxysmal supraventricular tachycardia)    Shortness of breath    STD (sexually transmitted disease)    HPV, 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   Syncope    Syncope    Vaginal bleeding 03/08/2014   BP 126/84 (BP Location: Left Arm, Patient Position: Sitting, Cuff Size: Large)   Pulse 85   Ht 5' 4 (1.626 m)   Wt 215 lb 12.8 oz (97.9 kg)   LMP 04/05/2016 Comment: spotting 11/2016 and 12/2016  SpO2 92%   BMI 37.04 kg/m   Opioid Risk Score:   Fall Risk Score:  `1  Depression screen PHQ 2/9     11/24/2024    3:41 PM 08/04/2024    2:00 PM 07/21/2024    2:52 PM 06/04/2024    1:43 PM 04/03/2024    3:00 PM 01/02/2024    3:06 PM 11/14/2023    2:18 PM  Depression screen PHQ 2/9  Decreased Interest 2 2 2 1 3 2 1   Down, Depressed, Hopeless 2 2 2 1 2 2 1   PHQ - 2 Score 4 4 4 2 5 4 2   Altered sleeping 3 1 1  2 3    Tired, decreased energy  3 3  2 3    Change in appetite 1 1 1   0 2   Feeling bad or failure about yourself  1 1 1  3 2    Trouble concentrating 1 1 1  1 1   Moving slowly or fidgety/restless 1 1 1   0 0   Suicidal thoughts 0 0 0  0 0   PHQ-9 Score 11 12  12   13  15     Difficult doing work/chores Somewhat difficult Very difficult Very difficult  Very difficult Somewhat difficult      Data saved with a previous flowsheet row definition      Review of Systems  Musculoskeletal:  Positive for back pain and neck pain.       Neck pain, upper, mid and lower back pain, bilateral arm and leg pain  All other systems reviewed and are negative.      Objective:   Physical Exam Vitals and nursing note reviewed.  Constitutional:       Appearance: Normal appearance.  Cardiovascular:     Rate and Rhythm: Normal rate and regular rhythm.     Pulses: Normal pulses.     Heart sounds: Normal heart sounds.  Pulmonary:     Effort: Pulmonary effort is normal.     Breath sounds: Normal breath sounds.  Musculoskeletal:     Comments: Normal Muscle Bulk and Muscle Testing Reveals:  Upper Extremities: Decreased ROM  90 Degrees and and Muscle Strength 4/5  Lumbar Hypersensitivity  Left Greater Trochanter Tenderness Lower Extremities: Decreased ROM and Muscle Strength 5/5 Bilateral Lower Extremities Flexion Produces Pain into her Bilateral Lower Extremities Arises from Table with ease Narrow Based  Gait     Skin:    General: Skin is warm and dry.  Neurological:     Mental Status: She is alert and oriented to person, place, and time.  Psychiatric:        Mood and Affect: Mood normal.        Behavior: Behavior normal.          Assessment & Plan:  Cervicalgia: Cervical Myelopathy: She is scheduled for Myelogram> Continue to Monitor.12/03/2024 Lumbar Radiculitis: Continue HEP as Tolerated. Continue current Medication Regimen. Continue to Monitor.12/03/2024 Chronic Bilateral Knee Pain: L>R: . Ortho Following Continue to Monitor. 12/03/2024 Chronic Pain Syndrome: Refilled Butrans  Patch 7.5 MCG/ 4 patches  and  Hydrocodone  7.5/325 mg  one tablet 5 times a day as needed for pain #150 Second scripts sent for the following month. We will continue the opioid monitoring program, this consists of regular clinic visits, examinations, urine drug screen, pill counts as well as use of [place]  Controlled Substance Reporting system. A 12 month History has been reviewed on the [place]  Controlled Substance Reporting System on 12/03/2024  F/U with Dr. Lovorn in 2 months     "

## 2024-12-04 ENCOUNTER — Other Ambulatory Visit: Payer: Self-pay

## 2024-12-04 DIAGNOSIS — C50911 Malignant neoplasm of unspecified site of right female breast: Secondary | ICD-10-CM

## 2024-12-04 MED ORDER — VITAMIN D (ERGOCALCIFEROL) 1.25 MG (50000 UNIT) PO CAPS: 50000.0000 [IU] | ORAL_CAPSULE | ORAL | 0 refills | Status: AC

## 2024-12-06 ENCOUNTER — Other Ambulatory Visit: Payer: Self-pay | Admitting: Nurse Practitioner

## 2024-12-06 DIAGNOSIS — E1165 Type 2 diabetes mellitus with hyperglycemia: Secondary | ICD-10-CM

## 2024-12-08 ENCOUNTER — Ambulatory Visit: Admitting: Physician Assistant

## 2024-12-10 ENCOUNTER — Encounter: Payer: Self-pay | Admitting: *Deleted

## 2024-12-10 ENCOUNTER — Ambulatory Visit: Admitting: Gastroenterology

## 2024-12-10 VITALS — BP 127/80 | HR 79 | Temp 98.4°F | Ht 64.0 in | Wt 216.8 lb

## 2024-12-10 DIAGNOSIS — Z800 Family history of malignant neoplasm of digestive organs: Secondary | ICD-10-CM | POA: Diagnosis not present

## 2024-12-10 DIAGNOSIS — K219 Gastro-esophageal reflux disease without esophagitis: Secondary | ICD-10-CM

## 2024-12-10 DIAGNOSIS — R110 Nausea: Secondary | ICD-10-CM | POA: Diagnosis not present

## 2024-12-10 DIAGNOSIS — R1310 Dysphagia, unspecified: Secondary | ICD-10-CM | POA: Insufficient documentation
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
## 2024-12-10 NOTE — Patient Instructions (Signed)
 Let's continue the Dexilant  once daily. You can add Pepcid  (over the counter) each afternoon. I have included a diet sheet that can help in case we are dealing with gastroparesis.   I have ordered the swallowing test. You may need an upper endoscopy with dilation.  We will see you in 3 months! Please message with any concerns in the meantime!  Happy late birthday!  I enjoyed seeing you again today! I value our relationship and want to provide genuine, compassionate, and quality care. You may receive a survey regarding your visit with me, and I welcome your feedback! Thanks so much for taking the time to complete this. I look forward to seeing you again.      Therisa MICAEL Stager, PhD, ANP-BC [facility]

## 2024-12-14 ENCOUNTER — Other Ambulatory Visit: Payer: Self-pay | Admitting: Family Medicine

## 2024-12-15 ENCOUNTER — Telehealth: Payer: Self-pay

## 2024-12-15 MED ORDER — PROMETHAZINE HCL 25 MG PO TABS: 12.5000 mg | ORAL_TABLET | Freq: Three times a day (TID) | ORAL | 0 refills | Status: AC | PRN

## 2024-12-15 NOTE — Telephone Encounter (Signed)
 I have sent in phenergan  to just take if severe nausea. She can take 1/2 pill every 8 hours for significant nausea. Do zofran  first. Phenergan  can cause dizziness, drowsiness, dry mouth.

## 2024-12-15 NOTE — Addendum Note (Signed)
 Addended by: SHIRLEAN THERISA ORN on: 12/15/2024 03:22 PM   Modules accepted: Orders

## 2024-12-15 NOTE — Telephone Encounter (Signed)
 Pt phoned wanting to know can we send her some nausea medication in to her pharmacy. Pt seen us  on 1-22 and another Dr did send in nausea medication on 1-16 (20 pills). Please advise

## 2024-12-16 ENCOUNTER — Other Ambulatory Visit (HOSPITAL_COMMUNITY)

## 2024-12-16 NOTE — Telephone Encounter (Signed)
 Phoned and spoke with the pt and advised her of her Rx  being sent in / instructions / MyChart message regarding this as well. Pt expressed understanding.

## 2024-12-18 ENCOUNTER — Other Ambulatory Visit (HOSPITAL_COMMUNITY)

## 2024-12-18 ENCOUNTER — Ambulatory Visit (HOSPITAL_COMMUNITY)

## 2024-12-19 ENCOUNTER — Other Ambulatory Visit: Payer: Self-pay | Admitting: Family Medicine

## 2024-12-19 DIAGNOSIS — J302 Other seasonal allergic rhinitis: Secondary | ICD-10-CM

## 2024-12-24 ENCOUNTER — Other Ambulatory Visit: Payer: Self-pay | Admitting: Gastroenterology

## 2024-12-24 ENCOUNTER — Other Ambulatory Visit: Payer: Self-pay

## 2024-12-24 DIAGNOSIS — E119 Type 2 diabetes mellitus without complications: Secondary | ICD-10-CM

## 2024-12-24 DIAGNOSIS — R110 Nausea: Secondary | ICD-10-CM

## 2024-12-24 MED ORDER — ONDANSETRON 8 MG PO TBDP: ORAL_TABLET | ORAL | 0 refills | Status: AC

## 2024-12-25 ENCOUNTER — Inpatient Hospital Stay (HOSPITAL_COMMUNITY): Admission: RE | Admit: 2024-12-25

## 2024-12-25 DIAGNOSIS — R1310 Dysphagia, unspecified: Secondary | ICD-10-CM

## 2024-12-25 MED ORDER — BLOOD GLUCOSE MONITORING SUPPL DEVI: 1.0000 | Freq: Two times a day (BID) | 1 refills | Status: AC

## 2024-12-25 NOTE — Telephone Encounter (Unsigned)
 Copied from CRM #[id number]. Topic: Clinical - Medication Refill >> Dec 25, 2024  4:22 PM Victoria B wrote: Medication: Blood Glucose Monitoring Suppl DEVI  Patient states has lost this,l doesn't know where it is  Has the patient contacted their pharmacy? no  This is the patient's preferred pharmacy:  [facility] [address], [place] - [address] [place] [address] Phone: [phone] Fax: [phone]  Is this the correct pharmacy for this prescription? yes   Has the prescription been filled recently? no  Is the patient out of the medication? yes  Has the patient been seen for an appointment in the last year OR does the patient have an upcoming appointment? yes  Can we respond through MyChart? Either way, mychart is fine also osr cll  Agent: Please be advised that Rx refills may take up to 3 business days. We ask that you follow-up with your pharmacy.

## 2024-12-31 ENCOUNTER — Ambulatory Visit: Admitting: Emergency Medicine

## 2025-01-01 ENCOUNTER — Encounter: Admitting: Physical Medicine and Rehabilitation

## 2025-01-05 ENCOUNTER — Ambulatory Visit: Admitting: Neurosurgery

## 2025-01-20 ENCOUNTER — Ambulatory Visit

## 2025-01-21 ENCOUNTER — Encounter: Admitting: Registered Nurse

## 2025-01-28 ENCOUNTER — Ambulatory Visit: Admitting: Pulmonary Disease

## 2025-02-16 ENCOUNTER — Ambulatory Visit: Admitting: Nurse Practitioner

## 2025-03-11 ENCOUNTER — Ambulatory Visit: Admitting: Gastroenterology

## 2025-03-16 ENCOUNTER — Ambulatory Visit: Admitting: Obstetrics and Gynecology

## 2025-03-26 ENCOUNTER — Encounter: Admitting: Physical Medicine and Rehabilitation

## 2025-04-21 ENCOUNTER — Ambulatory Visit

## 2025-05-11 ENCOUNTER — Ambulatory Visit: Admitting: Family Medicine

## 2025-07-12 ENCOUNTER — Other Ambulatory Visit

## 2025-07-12 ENCOUNTER — Ambulatory Visit (HOSPITAL_COMMUNITY)

## 2025-07-15 ENCOUNTER — Other Ambulatory Visit

## 2025-07-15 ENCOUNTER — Ambulatory Visit (HOSPITAL_COMMUNITY)

## 2025-07-20 ENCOUNTER — Ambulatory Visit: Admitting: Oncology
